# Patient Record
Sex: Female | Born: 1935
Health system: Southern US, Community
[De-identification: ages and names within clinical notes are randomized; demographics above are authoritative.]

## PROBLEM LIST (undated history)

## (undated) DIAGNOSIS — F419 Anxiety disorder, unspecified: Secondary | ICD-10-CM

## (undated) DIAGNOSIS — R5383 Other fatigue: Secondary | ICD-10-CM

## (undated) DIAGNOSIS — R5381 Other malaise: Secondary | ICD-10-CM

## (undated) DIAGNOSIS — I10 Essential (primary) hypertension: Secondary | ICD-10-CM

## (undated) DIAGNOSIS — N189 Chronic kidney disease, unspecified: Secondary | ICD-10-CM

## (undated) DIAGNOSIS — K5730 Diverticulosis of large intestine without perforation or abscess without bleeding: Secondary | ICD-10-CM

## (undated) DIAGNOSIS — I6529 Occlusion and stenosis of unspecified carotid artery: Secondary | ICD-10-CM

## (undated) DIAGNOSIS — J449 Chronic obstructive pulmonary disease, unspecified: Secondary | ICD-10-CM

## (undated) DIAGNOSIS — R0602 Shortness of breath: Secondary | ICD-10-CM

## (undated) DIAGNOSIS — Z87891 Personal history of nicotine dependence: Secondary | ICD-10-CM

## (undated) DIAGNOSIS — I700 Atherosclerosis of aorta: Secondary | ICD-10-CM

## (undated) DIAGNOSIS — E039 Hypothyroidism, unspecified: Secondary | ICD-10-CM

## (undated) DIAGNOSIS — M1990 Unspecified osteoarthritis, unspecified site: Secondary | ICD-10-CM

## (undated) DIAGNOSIS — E0500 Thyrotoxicosis with diffuse goiter without thyrotoxic crisis or storm: Secondary | ICD-10-CM

## (undated) DIAGNOSIS — E785 Hyperlipidemia, unspecified: Secondary | ICD-10-CM

## (undated) DIAGNOSIS — Z8673 Personal history of transient ischemic attack (TIA), and cerebral infarction without residual deficits: Principal | ICD-10-CM

## (undated) DIAGNOSIS — Z86010 Personal history of colonic polyps: Secondary | ICD-10-CM

## (undated) DIAGNOSIS — I639 Cerebral infarction, unspecified: Secondary | ICD-10-CM

## (undated) DIAGNOSIS — M5137 Other intervertebral disc degeneration, lumbosacral region: Secondary | ICD-10-CM

## (undated) HISTORY — DX: Personal history of nicotine dependence: Z87.891

## (undated) HISTORY — DX: Other fatigue: R53.83

## (undated) HISTORY — DX: Thyrotoxicosis with diffuse goiter without thyrotoxic crisis or storm: E05.00

## (undated) HISTORY — DX: Hyperlipidemia, unspecified: E78.5

## (undated) HISTORY — DX: Shortness of breath: R06.02

## (undated) HISTORY — DX: Hypothyroidism, unspecified: E03.9

## (undated) HISTORY — PX: CHOLECYSTECTOMY: SHX55

## (undated) HISTORY — DX: Diverticulosis of large intestine without perforation or abscess without bleeding: K57.30

## (undated) HISTORY — DX: Personal history of colonic polyps: Z86.010

## (undated) HISTORY — PX: CARDIAC CATHETERIZATION: SHX172

## (undated) HISTORY — DX: Essential (primary) hypertension: I10

## (undated) HISTORY — PX: KNEE SURGERY: SHX244

## (undated) HISTORY — DX: Other intervertebral disc degeneration, lumbosacral region: M51.37

## (undated) HISTORY — DX: Other malaise: R53.81

## (undated) HISTORY — PX: CATARACT EXTRACTION: SUR2

## (undated) HISTORY — DX: Unspecified osteoarthritis, unspecified site: M19.90

## (undated) HISTORY — DX: Personal history of transient ischemic attack (TIA), and cerebral infarction without residual deficits: Z86.73

## (undated) HISTORY — DX: Occlusion and stenosis of unspecified carotid artery: I65.29

---

## 1999-05-30 ENCOUNTER — Encounter: Admission: RE | Admit: 1999-05-30 | Discharge: 1999-05-30 | Payer: Self-pay | Admitting: Internal Medicine

## 1999-05-30 ENCOUNTER — Encounter: Payer: Self-pay | Admitting: Internal Medicine

## 1999-06-06 ENCOUNTER — Encounter: Payer: Self-pay | Admitting: Internal Medicine

## 1999-06-06 ENCOUNTER — Encounter: Admission: RE | Admit: 1999-06-06 | Discharge: 1999-06-06 | Payer: Self-pay | Admitting: Internal Medicine

## 2000-02-27 ENCOUNTER — Other Ambulatory Visit: Admission: RE | Admit: 2000-02-27 | Discharge: 2000-02-27 | Payer: Self-pay | Admitting: Internal Medicine

## 2000-08-04 ENCOUNTER — Encounter: Admission: RE | Admit: 2000-08-04 | Discharge: 2000-08-04 | Payer: Self-pay | Admitting: Internal Medicine

## 2000-08-04 ENCOUNTER — Encounter: Payer: Self-pay | Admitting: Internal Medicine

## 2001-08-06 ENCOUNTER — Encounter: Admission: RE | Admit: 2001-08-06 | Discharge: 2001-08-06 | Payer: Self-pay | Admitting: Internal Medicine

## 2001-08-06 ENCOUNTER — Encounter: Payer: Self-pay | Admitting: Internal Medicine

## 2002-08-09 ENCOUNTER — Encounter: Admission: RE | Admit: 2002-08-09 | Discharge: 2002-08-09 | Payer: Self-pay | Admitting: Internal Medicine

## 2002-08-09 ENCOUNTER — Encounter: Payer: Self-pay | Admitting: Internal Medicine

## 2003-03-31 ENCOUNTER — Encounter: Payer: Self-pay | Admitting: Internal Medicine

## 2003-08-17 ENCOUNTER — Encounter: Admission: RE | Admit: 2003-08-17 | Discharge: 2003-08-17 | Payer: Self-pay | Admitting: Internal Medicine

## 2004-04-24 ENCOUNTER — Ambulatory Visit: Payer: Self-pay | Admitting: Internal Medicine

## 2004-08-31 ENCOUNTER — Encounter: Admission: RE | Admit: 2004-08-31 | Discharge: 2004-08-31 | Payer: Self-pay | Admitting: Internal Medicine

## 2005-03-28 ENCOUNTER — Inpatient Hospital Stay (HOSPITAL_COMMUNITY): Admission: AD | Admit: 2005-03-28 | Discharge: 2005-03-30 | Payer: Self-pay | Admitting: Internal Medicine

## 2005-03-28 ENCOUNTER — Ambulatory Visit: Payer: Self-pay | Admitting: Internal Medicine

## 2005-03-29 ENCOUNTER — Encounter: Payer: Self-pay | Admitting: Internal Medicine

## 2005-03-29 ENCOUNTER — Encounter: Payer: Self-pay | Admitting: Cardiology

## 2005-03-29 ENCOUNTER — Ambulatory Visit: Payer: Self-pay | Admitting: Internal Medicine

## 2005-03-29 ENCOUNTER — Ambulatory Visit: Payer: Self-pay | Admitting: Cardiology

## 2005-04-02 ENCOUNTER — Ambulatory Visit: Payer: Self-pay | Admitting: Internal Medicine

## 2005-04-04 ENCOUNTER — Ambulatory Visit: Payer: Self-pay

## 2005-04-08 ENCOUNTER — Ambulatory Visit: Payer: Self-pay | Admitting: Internal Medicine

## 2005-04-08 ENCOUNTER — Encounter (HOSPITAL_COMMUNITY): Admission: RE | Admit: 2005-04-08 | Discharge: 2005-07-07 | Payer: Self-pay | Admitting: Internal Medicine

## 2005-04-10 ENCOUNTER — Ambulatory Visit: Payer: Self-pay | Admitting: Internal Medicine

## 2005-05-22 ENCOUNTER — Ambulatory Visit: Payer: Self-pay | Admitting: Internal Medicine

## 2005-08-21 ENCOUNTER — Ambulatory Visit: Payer: Self-pay | Admitting: Internal Medicine

## 2005-09-30 ENCOUNTER — Encounter: Admission: RE | Admit: 2005-09-30 | Discharge: 2005-09-30 | Payer: Self-pay | Admitting: Internal Medicine

## 2005-10-07 ENCOUNTER — Ambulatory Visit: Payer: Self-pay | Admitting: Internal Medicine

## 2005-11-20 ENCOUNTER — Ambulatory Visit: Payer: Self-pay | Admitting: Internal Medicine

## 2006-02-19 ENCOUNTER — Ambulatory Visit: Payer: Self-pay | Admitting: Internal Medicine

## 2006-05-14 ENCOUNTER — Ambulatory Visit: Payer: Self-pay | Admitting: Internal Medicine

## 2006-10-13 ENCOUNTER — Ambulatory Visit: Payer: Self-pay | Admitting: Internal Medicine

## 2006-10-21 ENCOUNTER — Encounter: Admission: RE | Admit: 2006-10-21 | Discharge: 2006-10-21 | Payer: Self-pay | Admitting: Internal Medicine

## 2006-10-21 ENCOUNTER — Encounter: Payer: Self-pay | Admitting: Internal Medicine

## 2006-11-13 ENCOUNTER — Ambulatory Visit: Payer: Self-pay | Admitting: Internal Medicine

## 2007-03-06 DIAGNOSIS — E785 Hyperlipidemia, unspecified: Secondary | ICD-10-CM

## 2007-03-06 DIAGNOSIS — M1990 Unspecified osteoarthritis, unspecified site: Secondary | ICD-10-CM

## 2007-03-06 DIAGNOSIS — I10 Essential (primary) hypertension: Secondary | ICD-10-CM

## 2007-03-06 DIAGNOSIS — E782 Mixed hyperlipidemia: Secondary | ICD-10-CM | POA: Insufficient documentation

## 2007-03-06 HISTORY — DX: Hyperlipidemia, unspecified: E78.5

## 2007-03-06 HISTORY — DX: Unspecified osteoarthritis, unspecified site: M19.90

## 2007-03-06 HISTORY — DX: Essential (primary) hypertension: I10

## 2007-04-14 ENCOUNTER — Ambulatory Visit: Payer: Self-pay | Admitting: Internal Medicine

## 2007-06-22 ENCOUNTER — Telehealth: Payer: Self-pay | Admitting: Internal Medicine

## 2007-10-13 ENCOUNTER — Ambulatory Visit: Payer: Self-pay | Admitting: Internal Medicine

## 2007-10-13 DIAGNOSIS — E039 Hypothyroidism, unspecified: Secondary | ICD-10-CM | POA: Insufficient documentation

## 2007-10-13 HISTORY — DX: Hypothyroidism, unspecified: E03.9

## 2007-10-13 LAB — CONVERTED CEMR LAB
ALT: 17 units/L (ref 0–35)
Albumin: 4 g/dL (ref 3.5–5.2)
BUN: 13 mg/dL (ref 6–23)
Basophils Relative: 0.3 % (ref 0.0–1.0)
Bilirubin, Direct: 0.2 mg/dL (ref 0.0–0.3)
CO2: 35 meq/L — ABNORMAL HIGH (ref 19–32)
Calcium: 9.5 mg/dL (ref 8.4–10.5)
Cholesterol: 228 mg/dL (ref 0–200)
Creatinine, Ser: 0.9 mg/dL (ref 0.4–1.2)
Direct LDL: 161.5 mg/dL
GFR calc Af Amer: 77 mL/min
Glucose, Bld: 109 mg/dL — ABNORMAL HIGH (ref 70–99)
HCT: 43.3 % (ref 36.0–46.0)
Hemoglobin: 14.6 g/dL (ref 12.0–15.0)
MCHC: 33.7 g/dL (ref 30.0–36.0)
Monocytes Absolute: 0.5 10*3/uL (ref 0.1–1.0)
Monocytes Relative: 9.2 % (ref 3.0–12.0)
Neutro Abs: 3.9 10*3/uL (ref 1.4–7.7)
RBC: 4.78 M/uL (ref 3.87–5.11)
RDW: 12.4 % (ref 11.5–14.6)
Sodium: 139 meq/L (ref 135–145)
TSH: 0.76 microintl units/mL (ref 0.35–5.50)
Total CHOL/HDL Ratio: 4.5
Total Protein: 7.2 g/dL (ref 6.0–8.3)

## 2007-11-04 ENCOUNTER — Encounter: Admission: RE | Admit: 2007-11-04 | Discharge: 2007-11-04 | Payer: Self-pay | Admitting: Internal Medicine

## 2007-11-09 ENCOUNTER — Ambulatory Visit: Payer: Self-pay | Admitting: Internal Medicine

## 2007-11-09 DIAGNOSIS — R531 Weakness: Secondary | ICD-10-CM

## 2007-11-09 DIAGNOSIS — R5381 Other malaise: Secondary | ICD-10-CM

## 2007-11-09 HISTORY — DX: Other malaise: R53.81

## 2007-11-17 ENCOUNTER — Ambulatory Visit: Payer: Self-pay | Admitting: Internal Medicine

## 2007-11-19 ENCOUNTER — Telehealth: Payer: Self-pay | Admitting: Internal Medicine

## 2007-12-02 ENCOUNTER — Ambulatory Visit: Payer: Self-pay | Admitting: Internal Medicine

## 2008-01-14 ENCOUNTER — Ambulatory Visit: Payer: Self-pay | Admitting: Internal Medicine

## 2008-01-14 LAB — CONVERTED CEMR LAB
Eosinophils Relative: 0.8 % (ref 0.0–5.0)
Lymphocytes Relative: 18.1 % (ref 12.0–46.0)
Monocytes Relative: 9.5 % (ref 3.0–12.0)
Neutrophils Relative %: 70.8 % (ref 43.0–77.0)
Platelets: 239 10*3/uL (ref 150–400)
RDW: 12.6 % (ref 11.5–14.6)
WBC: 5.9 10*3/uL (ref 4.5–10.5)

## 2008-04-12 ENCOUNTER — Ambulatory Visit: Payer: Self-pay | Admitting: Internal Medicine

## 2008-07-13 ENCOUNTER — Ambulatory Visit: Payer: Self-pay | Admitting: Internal Medicine

## 2008-07-13 DIAGNOSIS — R0602 Shortness of breath: Secondary | ICD-10-CM | POA: Insufficient documentation

## 2008-07-13 HISTORY — DX: Shortness of breath: R06.02

## 2008-07-14 ENCOUNTER — Telehealth: Payer: Self-pay | Admitting: Internal Medicine

## 2008-07-14 ENCOUNTER — Encounter: Payer: Self-pay | Admitting: Internal Medicine

## 2008-07-14 LAB — CONVERTED CEMR LAB: Troponin I: 0.01 ng/mL (ref <0.06)

## 2008-09-19 ENCOUNTER — Telehealth: Payer: Self-pay | Admitting: Internal Medicine

## 2008-09-30 DIAGNOSIS — K5730 Diverticulosis of large intestine without perforation or abscess without bleeding: Secondary | ICD-10-CM | POA: Insufficient documentation

## 2008-09-30 DIAGNOSIS — Z860100 Personal history of colon polyps, unspecified: Secondary | ICD-10-CM | POA: Insufficient documentation

## 2008-09-30 HISTORY — DX: Personal history of colon polyps, unspecified: Z86.0100

## 2008-09-30 HISTORY — DX: Diverticulosis of large intestine without perforation or abscess without bleeding: K57.30

## 2008-09-30 HISTORY — DX: Personal history of colonic polyps: Z86.010

## 2008-10-04 ENCOUNTER — Ambulatory Visit: Payer: Self-pay | Admitting: Internal Medicine

## 2008-10-12 ENCOUNTER — Ambulatory Visit: Payer: Self-pay | Admitting: Internal Medicine

## 2008-10-12 LAB — CONVERTED CEMR LAB
Alkaline Phosphatase: 97 units/L (ref 39–117)
BUN: 14 mg/dL (ref 6–23)
Basophils Relative: 0.2 % (ref 0.0–3.0)
Bilirubin, Direct: 0.1 mg/dL (ref 0.0–0.3)
Calcium: 9.2 mg/dL (ref 8.4–10.5)
Chloride: 99 meq/L (ref 96–112)
Cholesterol: 209 mg/dL — ABNORMAL HIGH (ref 0–200)
Creatinine, Ser: 0.8 mg/dL (ref 0.4–1.2)
Direct LDL: 133.8 mg/dL
Eosinophils Absolute: 0.1 10*3/uL (ref 0.0–0.7)
Eosinophils Relative: 1.8 % (ref 0.0–5.0)
Lymphocytes Relative: 24.8 % (ref 12.0–46.0)
MCHC: 34.4 g/dL (ref 30.0–36.0)
MCV: 92.7 fL (ref 78.0–100.0)
Monocytes Absolute: 0.8 10*3/uL (ref 0.1–1.0)
Neutrophils Relative %: 60.5 % (ref 43.0–77.0)
Platelets: 210 10*3/uL (ref 150.0–400.0)
RBC: 4.62 M/uL (ref 3.87–5.11)
Total Bilirubin: 1 mg/dL (ref 0.3–1.2)
Total CHOL/HDL Ratio: 4
Triglycerides: 91 mg/dL (ref 0.0–149.0)
VLDL: 18.2 mg/dL (ref 0.0–40.0)
WBC: 6 10*3/uL (ref 4.5–10.5)

## 2008-11-08 ENCOUNTER — Ambulatory Visit: Payer: Self-pay | Admitting: Internal Medicine

## 2008-11-08 ENCOUNTER — Encounter: Payer: Self-pay | Admitting: Internal Medicine

## 2008-11-09 ENCOUNTER — Encounter: Payer: Self-pay | Admitting: Internal Medicine

## 2008-11-25 ENCOUNTER — Encounter: Admission: RE | Admit: 2008-11-25 | Discharge: 2008-11-25 | Payer: Self-pay | Admitting: Internal Medicine

## 2008-11-29 ENCOUNTER — Encounter: Admission: RE | Admit: 2008-11-29 | Discharge: 2008-11-29 | Payer: Self-pay | Admitting: Internal Medicine

## 2008-12-09 DIAGNOSIS — S91009A Unspecified open wound, unspecified ankle, initial encounter: Secondary | ICD-10-CM

## 2008-12-09 DIAGNOSIS — S81009A Unspecified open wound, unspecified knee, initial encounter: Secondary | ICD-10-CM

## 2008-12-09 DIAGNOSIS — S81809A Unspecified open wound, unspecified lower leg, initial encounter: Secondary | ICD-10-CM | POA: Insufficient documentation

## 2008-12-12 ENCOUNTER — Ambulatory Visit: Payer: Self-pay | Admitting: Family Medicine

## 2008-12-16 ENCOUNTER — Ambulatory Visit: Payer: Self-pay | Admitting: Family Medicine

## 2008-12-16 DIAGNOSIS — M5137 Other intervertebral disc degeneration, lumbosacral region: Secondary | ICD-10-CM

## 2008-12-16 DIAGNOSIS — M51379 Other intervertebral disc degeneration, lumbosacral region without mention of lumbar back pain or lower extremity pain: Secondary | ICD-10-CM

## 2008-12-16 HISTORY — DX: Other intervertebral disc degeneration, lumbosacral region: M51.37

## 2008-12-16 HISTORY — DX: Other intervertebral disc degeneration, lumbosacral region without mention of lumbar back pain or lower extremity pain: M51.379

## 2008-12-19 ENCOUNTER — Ambulatory Visit: Payer: Self-pay | Admitting: Family Medicine

## 2009-04-12 ENCOUNTER — Ambulatory Visit: Payer: Self-pay | Admitting: Internal Medicine

## 2009-04-12 DIAGNOSIS — Z87891 Personal history of nicotine dependence: Secondary | ICD-10-CM

## 2009-04-12 HISTORY — DX: Personal history of nicotine dependence: Z87.891

## 2009-10-04 ENCOUNTER — Ambulatory Visit: Payer: Self-pay | Admitting: Internal Medicine

## 2009-10-04 DIAGNOSIS — M79609 Pain in unspecified limb: Secondary | ICD-10-CM | POA: Insufficient documentation

## 2009-10-18 ENCOUNTER — Ambulatory Visit: Payer: Self-pay | Admitting: Internal Medicine

## 2009-10-18 LAB — CONVERTED CEMR LAB: Cholesterol: 224 mg/dL — ABNORMAL HIGH (ref 0–200)

## 2009-11-30 ENCOUNTER — Encounter: Admission: RE | Admit: 2009-11-30 | Discharge: 2009-11-30 | Payer: Self-pay | Admitting: Internal Medicine

## 2009-12-06 ENCOUNTER — Ambulatory Visit: Payer: Self-pay | Admitting: Internal Medicine

## 2009-12-06 LAB — CONVERTED CEMR LAB
ALT: 17 units/L (ref 0–35)
Albumin: 4 g/dL (ref 3.5–5.2)
Basophils Relative: 0.4 % (ref 0.0–3.0)
Bilirubin, Direct: 0.1 mg/dL (ref 0.0–0.3)
CO2: 36 meq/L — ABNORMAL HIGH (ref 19–32)
Chloride: 98 meq/L (ref 96–112)
Eosinophils Relative: 1.7 % (ref 0.0–5.0)
HCT: 43.7 % (ref 36.0–46.0)
Hemoglobin: 14.8 g/dL (ref 12.0–15.0)
Lymphs Abs: 1.3 10*3/uL (ref 0.7–4.0)
MCHC: 33.9 g/dL (ref 30.0–36.0)
MCV: 94.2 fL (ref 78.0–100.0)
Monocytes Absolute: 0.6 10*3/uL (ref 0.1–1.0)
Neutro Abs: 3.6 10*3/uL (ref 1.4–7.7)
Neutrophils Relative %: 64.7 % (ref 43.0–77.0)
Potassium: 4.5 meq/L (ref 3.5–5.1)
RBC: 4.64 M/uL (ref 3.87–5.11)
Sed Rate: 12 mm/hr (ref 0–22)
Sodium: 142 meq/L (ref 135–145)
Total Protein: 7.1 g/dL (ref 6.0–8.3)
WBC: 5.6 10*3/uL (ref 4.5–10.5)

## 2010-04-25 ENCOUNTER — Encounter: Payer: Self-pay | Admitting: Internal Medicine

## 2010-04-25 ENCOUNTER — Ambulatory Visit: Payer: Self-pay | Admitting: Internal Medicine

## 2010-08-05 LAB — CONVERTED CEMR LAB
Bilirubin, Direct: 0.1 mg/dL (ref 0.0–0.3)
Eosinophils Absolute: 0.2 10*3/uL (ref 0.0–0.6)
Eosinophils Relative: 2.6 % (ref 0.0–5.0)
GFR calc Af Amer: 89 mL/min
GFR calc non Af Amer: 74 mL/min
Glucose, Bld: 116 mg/dL — ABNORMAL HIGH (ref 70–99)
HCT: 41 % (ref 36.0–46.0)
HDL: 50.5 mg/dL (ref >39.0)
Hemoglobin: 14 g/dL (ref 12.0–15.0)
Hgb A1c MFr Bld: 6 % (ref 4.6–6.0)
Lymphocytes Relative: 22 % (ref 12.0–46.0)
MCV: 91.4 fL (ref 78.0–100.0)
Monocytes Absolute: 0.7 10*3/uL (ref 0.2–0.7)
Neutro Abs: 4.2 10*3/uL (ref 1.4–7.7)
Neutrophils Relative %: 63.6 % (ref 43.0–77.0)
Sodium: 141 meq/L (ref 135–145)
TSH: 0.13 microintl units/mL — ABNORMAL LOW (ref 0.35–5.50)
Total Protein: 7.1 g/dL (ref 6.0–8.3)
WBC: 6.5 10*3/uL (ref 4.5–10.5)

## 2010-08-07 NOTE — Assessment & Plan Note (Signed)
Summary: BP LOW NO ENERGY/PS   Vital Signs:  [name] profile:   77 year old female Weight:      162 pounds Temp:     98.1 degrees F oral BP sitting:   138 / 80  (right arm) Cuff size:   regular  Vitals Entered By: Duard Brady LPN (December 07, 4538 8:34 AM) CC: c/o no energy , nausea -  Is [name] Diabetic? No   Primary Care Provider:  Eleonore Chiquito MD  CC:  c/o no energy  and nausea - .  History of Present Illness: [name] who for the past week has felt weak and and drained.  She describes a lack of energy.  She also describes some occasional nausea.  One week ago.  She felt the a bit short of breath, but this resolved after a several minutes.  She denies any dyspnea on exertion or chest pain.  Today she feels somewhat weak, but has no real focal symptoms.  She does have treated hypertension, and dyslipidemia.  Allergies (verified): No Known Drug Allergies  Past History:  Past Medical History: Reviewed history from 09/30/2008 and no changes required. Graves disease, S/P R131 history or presyncope.  September 2006 COLONIC POLYPS, HYPERPLASTIC, HX OF (ICD-V12.72) COLONIC POLYPS, ADENOMATOUS, HX OF (ICD-V12.72) DIVERTICULOSIS, COLON (ICD-562.10) DYSPNEA (ICD-786.05) WEAKNESS (ICD-780.79) HYPOTHYROIDISM (ICD-244.9) OSTEOARTHRITIS (ICD-715.90) HYPERTENSION (ICD-401.9) HYPERLIPIDEMIA (ICD-272.4)  Past Surgical History: Reviewed history from 09/30/2008 and no changes required. Cataract extraction-right eye Knee surgery -right Cholecystectomy heart catheterization, 1992 Angioplasty gravida 4, para 4, abortus zero status post I-131 2006 colonoscopy September 2004 adenosine Myoview study September 2006 ejection fraction 66% 2-D echocardiogram September 2006 with ejection fraction 55 to 65%  Family History: Reviewed history from 09/30/2008 and no changes required. father  died at 80 of myocardial infarction mother died 49, breast cancer One  brother Family History of Stomach Cancer: MGM No FH of Colon Cancer:  Review of Systems       The [name] complains of muscle weakness.  The [name] denies anorexia, fever, weight loss, weight gain, vision loss, decreased hearing, hoarseness, chest pain, syncope, dyspnea on exertion, peripheral edema, prolonged cough, headaches, hemoptysis, abdominal pain, melena, hematochezia, severe indigestion/heartburn, hematuria, incontinence, genital sores, suspicious skin lesions, transient blindness, difficulty walking, depression, unusual weight change, abnormal bleeding, enlarged lymph nodes, angioedema, and breast masses.    Physical Exam  General:  Well-developed,well-nourished,in no acute distress; alert,appropriate and cooperative throughout examination; 130/80 Head:  Normocephalic and atraumatic without obvious abnormalities. No apparent alopecia or balding. Eyes:  No corneal or conjunctival inflammation noted. EOMI. Perrla. Funduscopic exam benign, without hemorrhages, exudates or papilledema. Vision grossly normal. Mouth:  Oral mucosa and oropharynx without lesions or exudates.  Teeth in good repair. Neck:  No deformities, masses, or tenderness noted. Lungs:  a few crackles, right base; diminished breath sounds left base O2 saturation 97% Heart:  Normal rate and regular rhythm. S1 and S2 normal without gallop, murmur, click, rub or other extra sounds. Abdomen:  Bowel sounds positive,abdomen soft and non-tender without masses, organomegaly or hernias noted. Msk:  No deformity or scoliosis noted of thoracic or lumbar spine.   Pulses:  posterior tibial pulses not easily palpable.  dorsalis pedis  pulses full Extremities:  No clubbing, cyanosis, edema, or deformity noted with normal full range of motion of all joints.   Neurologic:  alert & oriented X3, cranial nerves II-XII intact, sensation intact to light touch, and sensation intact to pinprick.  alert & oriented X3, cranial nerves  II-XII  intact, sensation intact to light touch, and sensation intact to pinprick.     Impression & Recommendations:  Problem # 1:  WEAKNESS (ICD-780.79)  Orders: Venipuncture (16109) TLB-BMP (Basic Metabolic Panel-BMET) (80048-METABOL) TLB-CBC Platelet - w/Differential (85025-CBCD) TLB-Hepatic/Liver Function Pnl (80076-HEPATIC) TLB-TSH (Thyroid Stimulating Hormone) (84443-TSH) TLB-Sedimentation Rate (ESR) (85652-ESR) T-2 View CXR (71020TC)  Problem # 2:  DYSPNEA (ICD-786.05)  Orders: Venipuncture (60454) TLB-BMP (Basic Metabolic Panel-BMET) (80048-METABOL) TLB-CBC Platelet - w/Differential (85025-CBCD) TLB-Hepatic/Liver Function Pnl (80076-HEPATIC) TLB-TSH (Thyroid Stimulating Hormone) (84443-TSH) T-2 View CXR (71020TC)  Problem # 3:  HYPOTHYROIDISM (ICD-244.9)  Her updated medication list for this problem includes:    Levothyroxine Sodium 75 Mcg Tabs (Levothyroxine sodium) .[name] Kitchen... 1 once daily    Her updated medication list for this problem includes:    Levothyroxine Sodium 75 Mcg Tabs (Levothyroxine sodium) .[name] Kitchen... 1 once daily  Orders: Venipuncture (09811) TLB-BMP (Basic Metabolic Panel-BMET) (80048-METABOL) TLB-CBC Platelet - w/Differential (85025-CBCD) TLB-Hepatic/Liver Function Pnl (80076-HEPATIC) TLB-TSH (Thyroid Stimulating Hormone) (84443-TSH)  Problem # 4:  HYPERLIPIDEMIA (ICD-272.4)  The following medications were removed from the medication list:    Pravastatin Sodium 20 Mg Tabs (Pravastatin sodium) ..... One daily Her updated medication list for this problem includes:    Pravastatin Sodium 40 Mg Tabs (Pravastatin sodium) ..... One daily  The following medications were removed from the medication list:    Pravastatin Sodium 20 Mg Tabs (Pravastatin sodium) ..... One daily Her updated medication list for this problem includes:    Pravastatin Sodium 40 Mg Tabs (Pravastatin sodium) ..... One daily  Problem # 5:  HYPERTENSION (ICD-401.9)  Her updated medication  list for this problem includes:    Hydrochlorothiazide 25 Mg Tabs (Hydrochlorothiazide) .[name] Kitchen... 1 once daily    Felodipine 5 Mg Tb24 (Felodipine) .[name] Kitchen... 1 once daily    Her updated medication list for this problem includes:    Hydrochlorothiazide 25 Mg Tabs (Hydrochlorothiazide) .[name] Kitchen... 1 once daily    Felodipine 5 Mg Tb24 (Felodipine) .[name] Kitchen... 1 once daily  Orders: Venipuncture (91478) TLB-BMP (Basic Metabolic Panel-BMET) (80048-METABOL) TLB-CBC Platelet - w/Differential (85025-CBCD) TLB-Hepatic/Liver Function Pnl (80076-HEPATIC) TLB-TSH (Thyroid Stimulating Hormone) (84443-TSH)  Complete Medication List: 1)  Hydrochlorothiazide 25 Mg Tabs (Hydrochlorothiazide) .[name] Kitchen.. 1 once daily 2)  Levothyroxine Sodium 75 Mcg Tabs (Levothyroxine sodium) .[name] Kitchen.. 1 once daily 3)  Felodipine 5 Mg Tb24 (Felodipine) .[name] Kitchen.. 1 once daily 4)  Adult Aspirin Ec Low Strength 81 Mg Tbec (Aspirin) .[name] Kitchen.. 1 once daily 5)  Pravastatin Sodium 40 Mg Tabs (Pravastatin sodium) .... One daily  [name] Instructions: 1)  Please schedule a follow-up appointment as needed. 2)  Limit your Sodium (Salt) to less than 2 grams a day(slightly less than 1/2 a teaspoon) to prevent fluid retention, swelling, or worsening of symptoms. 3)  It is important that you exercise regularly at least 20 minutes 5 times a week. If you develop chest pain, have severe difficulty breathing, or feel very tired , stop exercising immediately and seek medical attention. Prescriptions: PRAVASTATIN SODIUM 40 MG TABS (PRAVASTATIN SODIUM) one daily  #90 x 6   Entered and Authorized by:   Gordy Savers  MD   Signed by:   Gordy Savers  MD on 12/06/2009   Method used:   Electronically to        [facility]  [phone]* (retail)       [address] [place], [place]  [address]       Ph: [phone] or [phone]  Fax: [phone]   RxID:   [id number]

## 2010-08-07 NOTE — Assessment & Plan Note (Signed)
Summary: 6 month rov/njr/pt rescd from bump//ccm   Vital Signs:  Patient profile:   77 year old female Weight:      166 pounds Temp:     97.8 degrees F oral BP sitting:   120 / 72  (left arm) Cuff size:   regular  Vitals Entered By: Duard Brady LPN (October 18, 2009 10:53 AM) CC: 6 mos rov - doing well Is Patient Diabetic? No   Primary Care Provider:  Eleonore Chiquito MD  CC:  6 mos rov - doing well.  History of Present Illness: an 77 year old patient who is seen today for follow-up of her hypertension, dyslipidemia, and hypothyroidism.  She has done remarkably well.  No concerns or complaints.  Due to cost concerns.  She was switched to pravastatin, recently, she continues to tolerate this medication well.  Blood pressure remains under excellent control.  She is on supplemental levothyroxine  Allergies (verified): No Known Drug Allergies  Past History:  Past Medical History: Reviewed history from 09/30/2008 and no changes required. Graves disease, S/P R131 history or presyncope.  September 2006 COLONIC POLYPS, HYPERPLASTIC, HX OF (ICD-V12.72) COLONIC POLYPS, ADENOMATOUS, HX OF (ICD-V12.72) DIVERTICULOSIS, COLON (ICD-562.10) DYSPNEA (ICD-786.05) WEAKNESS (ICD-780.79) HYPOTHYROIDISM (ICD-244.9) OSTEOARTHRITIS (ICD-715.90) HYPERTENSION (ICD-401.9) HYPERLIPIDEMIA (ICD-272.4)  Review of Systems  The patient denies anorexia, fever, weight loss, weight gain, vision loss, decreased hearing, hoarseness, chest pain, syncope, dyspnea on exertion, peripheral edema, prolonged cough, headaches, hemoptysis, abdominal pain, melena, hematochezia, severe indigestion/heartburn, hematuria, incontinence, genital sores, muscle weakness, suspicious skin lesions, transient blindness, difficulty walking, depression, unusual weight change, abnormal bleeding, enlarged lymph nodes, angioedema, and breast masses.    Physical Exam  General:  Well-developed,well-nourished,in no acute  distress; alert,appropriate and cooperative throughout examination; 120/70 Head:  Normocephalic and atraumatic without obvious abnormalities. No apparent alopecia or balding. Mouth:  Oral mucosa and oropharynx without lesions or exudates.  Teeth in good repair. Neck:  No deformities, masses, or tenderness noted. Lungs:  Normal respiratory effort, chest expands symmetrically. Lungs are clear to auscultation, no crackles or wheezes. Heart:  Normal rate and regular rhythm. S1 and S2 normal without gallop, murmur, click, rub or other extra sounds. Abdomen:  Bowel sounds positive,abdomen soft and non-tender without masses, organomegaly or hernias noted.   Impression & Recommendations:  Problem # 1:  HYPERTENSION (ICD-401.9)  Her updated medication list for this problem includes:    Hydrochlorothiazide 25 Mg Tabs (Hydrochlorothiazide) .[name] Kitchen... 1 once daily    Felodipine 5 Mg Tb24 (Felodipine) .[name] Kitchen... 1 once daily  Her updated medication list for this problem includes:    Hydrochlorothiazide 25 Mg Tabs (Hydrochlorothiazide) .[name] Kitchen... 1 once daily    Felodipine 5 Mg Tb24 (Felodipine) .[name] Kitchen... 1 once daily  Problem # 2:  HYPERLIPIDEMIA (ICD-272.4)  Her updated medication list for this problem includes:    Pravastatin Sodium 20 Mg Tabs (Pravastatin sodium) ..... One daily  Her updated medication list for this problem includes:    Pravastatin Sodium 20 Mg Tabs (Pravastatin sodium) ..... One daily  Orders: Venipuncture (16109) TLB-Cholesterol, Total (82465-CHO)  Complete Medication List: 1)  Hydrochlorothiazide 25 Mg Tabs (Hydrochlorothiazide) .[name] Kitchen.. 1 once daily 2)  Levothyroxine Sodium 75 Mcg Tabs (Levothyroxine sodium) .[name] Kitchen.. 1 once daily 3)  Felodipine 5 Mg Tb24 (Felodipine) .[name] Kitchen.. 1 once daily 4)  Adult Aspirin Ec Low Strength 81 Mg Tbec (Aspirin) .[name] Kitchen.. 1 once daily 5)  Pravastatin Sodium 20 Mg Tabs (Pravastatin sodium) .... One daily 6)  Flexeril 5 Mg Tabs (Cyclobenzaprine hcl) .... Take 1 tablet by  mouth three times a day  Patient Instructions: 1)  Please schedule a follow-up appointment in 6 months. 2)  Advised not to eat any food or drink any liquids after 10 PM the night before your procedure. 3)  It is important that you exercise regularly at least 20 minutes 5 times a week. If you develop chest pain, have severe difficulty breathing, or feel very tired , stop exercising immediately and seek medical attention. 4)  You need to lose weight. Consider a lower calorie diet and regular exercise.  Prescriptions: PRAVASTATIN SODIUM 20 MG TABS (PRAVASTATIN SODIUM) one daily  #90 x 6   Entered and Authorized by:   Gordy Savers  MD   Signed by:   Gordy Savers  MD on 10/18/2009   Method used:   Electronically to        [facility]  [phone]* (retail)       [address] [place], [place]  [address]       Ph: [phone] or [phone]       Fax: [phone]   RxID:   [id number] FELODIPINE 5 MG  TB24 (FELODIPINE) 1 once daily  #90 x 6   Entered and Authorized by:   Gordy Savers  MD   Signed by:   Gordy Savers  MD on 10/18/2009   Method used:   Electronically to        [facility]  [phone]* (retail)       [address] [place], [place]  [address]       Ph: [phone] or [phone]       Fax: [phone]   RxID:   [id number] LEVOTHYROXINE SODIUM 75 MCG  TABS (LEVOTHYROXINE SODIUM) 1 once daily  #90 x 6   Entered and Authorized by:   Gordy Savers  MD   Signed by:   Gordy Savers  MD on 10/18/2009   Method used:   Electronically to        [facility]  [phone]* (retail)       [address] [place], [place]  [address]       Ph: [phone] or [phone]       Fax: [phone]   RxID:   [id number] HYDROCHLOROTHIAZIDE 25 MG  TABS (HYDROCHLOROTHIAZIDE) 1 once daily  #90 x 6   Entered and Authorized by:   Gordy Savers  MD   Signed by:   Gordy Savers  MD on 10/18/2009   Method  used:   Electronically to        [facility]  [phone]* (retail)       [address] [place], [place]  [address]       Ph: [phone] or [phone]       Fax: [phone]   RxID:   [id number]

## 2010-08-07 NOTE — Assessment & Plan Note (Signed)
Summary: pt will come in fasting/njr   Vital Signs:  Patient profile:   77 year old female Height:      66 inches Weight:      164 pounds BMI:     26.57 Temp:     98.0 degrees F oral BP sitting:   118 / 70  (left arm) Cuff size:   regular  Vitals Entered By: Duard Brady LPN (April 25, 2010 9:36 AM) CC: cpx - doing well Is Patient Diabetic? No Flu Vaccine Consent Questions     Do you have a history of severe allergic reactions to this vaccine? no    Any prior history of allergic reactions to egg and/or gelatin? no    Do you have a sensitivity to the preservative Thimersol? no    Do you have a past history of Guillan-Barre Syndrome? no    Do you currently have an acute febrile illness? no    Have you ever had a severe reaction to latex? no    Vaccine information given and explained to patient? yes    Are you currently pregnant? no    Lot Number:[id number]   Exp Date:01/05/2011   Site Given  Left Deltoid IM   Primary Care Provider:  Eleonore Chiquito MD  CC:  cpx - doing well.  History of Present Illness: an 77 year old patient who is seen today for a wellness exam.  She has treated hypertension, osteoarthritis, dyslipidemia, and hypothyroidism.  She does remarkably well.  Here for Medicare AWV:  1.   Risk factors based on Past M, S, F history:cardiovascular risk factors include hypertension, and dyslipidemia.  She also has a family history of coronary artery disease 2.   Physical Activities: remains active without exercise limitations 3.   Depression/mood: no history of depression or mood disorder 4.   Hearing: no deficits 5.   ADL's: independent in all aspects of daily living 6.   Fall Risk: low 7.   Home Safety: no problems identified 8.   Height, weight, &visual acuity:height and weight stable.  No difficulty with visual acuity 9.   Counseling: heart healthy diet more regular exercise restricted salt diet.  All discussed and encouraged calcium and vitamin D  supplementation.  Encouraged 10.   Labs ordered based on risk factors: laboratory studies were done 3 months ago 11.           Referral Coordination- none appropriate at this time 12.           Care Plan- heart healthy diet and compliance with medications.  All encouraged 13.            Cognitive Assessment- alert and oriented, with normal affect; no memory concerns handles all executive functioning without difficulty   Preventive Screening-Counseling & Management  Alcohol-Tobacco     Smoking Status: quit  Allergies (verified): No Known Drug Allergies  Past History:  Past Medical History: Reviewed history from 09/30/2008 and no changes required. Graves disease, S/P R131 history or presyncope.  September 2006 COLONIC POLYPS, HYPERPLASTIC, HX OF (ICD-V12.72) COLONIC POLYPS, ADENOMATOUS, HX OF (ICD-V12.72) DIVERTICULOSIS, COLON (ICD-562.10) DYSPNEA (ICD-786.05) WEAKNESS (ICD-780.79) HYPOTHYROIDISM (ICD-244.9) OSTEOARTHRITIS (ICD-715.90) HYPERTENSION (ICD-401.9) HYPERLIPIDEMIA (ICD-272.4)  Past Surgical History: Reviewed history from 09/30/2008 and no changes required. Cataract extraction-right eye Knee surgery -right Cholecystectomy heart catheterization, 1992 Angioplasty gravida 4, para 4, abortus zero status post I-131 2006 colonoscopy September 2004 adenosine Myoview study September 2006 ejection fraction 66% 2-D echocardiogram September 2006 with ejection fraction 55 to 65%  Family History:  Reviewed history from 09/30/2008 and no changes required. father  died at 57 of myocardial infarction mother died 16, breast cancer One brother-  Family History of Stomach Cancer: MGM No FH of Colon Cancer:  Social History: Reviewed history from 09/30/2008 and no changes required. Patient is a former smoker. -Stopped 25 years ago Alcohol Use - yes-2 glasses daily Daily Caffeine Use- 2 cups daily Illicit Drug Use - no Smoking Status:  quit  Review of Systems  The  patient denies anorexia, fever, weight loss, weight gain, vision loss, decreased hearing, hoarseness, chest pain, syncope, dyspnea on exertion, peripheral edema, prolonged cough, headaches, hemoptysis, abdominal pain, melena, hematochezia, severe indigestion/heartburn, hematuria, incontinence, genital sores, muscle weakness, suspicious skin lesions, transient blindness, difficulty walking, depression, unusual weight change, abnormal bleeding, enlarged lymph nodes, angioedema, and breast masses.    Physical Exam  General:  Well-developed,well-nourished,in no acute distress; alert,appropriate and cooperative throughout examination Head:  Normocephalic and atraumatic without obvious abnormalities. No apparent alopecia or balding. Eyes:  No corneal or conjunctival inflammation noted. EOMI. Perrla. Funduscopic exam benign, without hemorrhages, exudates or papilledema. Vision grossly normal. Ears:  External ear exam shows no significant lesions or deformities.  Otoscopic examination reveals clear canals, tympanic membranes are intact bilaterally without bulging, retraction, inflammation or discharge. Hearing is grossly normal bilaterally. Nose:  External nasal examination shows no deformity or inflammation. Nasal mucosa are pink and moist without lesions or exudates. Mouth:  Oral mucosa and oropharynx without lesions or exudates.  Teeth in good repair. Neck:  No deformities, masses, or tenderness noted. Chest Wall:  No deformities, masses, or tenderness noted. Breasts:  No mass, nodules, thickening, tenderness, bulging, retraction, inflamation, nipple discharge or skin changes noted.   Lungs:  Normal respiratory effort, chest expands symmetrically. Lungs are clear to auscultation, no crackles or wheezes. Heart:  Normal rate and regular rhythm. S1 and S2 normal without gallop, murmur, click, rub or other extra sounds. Abdomen:  Bowel sounds positive,abdomen soft and non-tender without masses, organomegaly or  hernias noted. Rectal:  declines Genitalia:  declines Msk:  No deformity or scoliosis noted of thoracic or lumbar spine.   Pulses:  R and L carotid,radial,femoral,dorsalis pedis and posterior tibial pulses are full and equal bilaterally Extremities:  No clubbing, cyanosis, edema, or deformity noted with normal full range of motion of all joints.   Neurologic:  No cranial nerve deficits noted. Station and gait are normal. Plantar reflexes are down-going bilaterally. DTRs are symmetrical throughout. Sensory, motor and coordinative functions appear intact. Skin:  Intact without suspicious lesions or rashes Cervical Nodes:  No lymphadenopathy noted Axillary Nodes:  No palpable lymphadenopathy Inguinal Nodes:  No significant adenopathy Psych:  Cognition and judgment appear intact. Alert and cooperative with normal attention span and concentration. No apparent delusions, illusions, hallucinations   Impression & Recommendations:  Problem # 1:  PREVENTIVE HEALTH CARE (ICD-V70.0)  Orders: Medicare -1st Annual Wellness Visit [phone])  Problem # 2:  HYPERTENSION (ICD-401.9)  Her updated medication list for this problem includes:    Hydrochlorothiazide 25 Mg Tabs (Hydrochlorothiazide) .[name] Kitchen... 1 once daily    Felodipine 5 Mg Tb24 (Felodipine) .[name] Kitchen... 1 once daily  Orders: EKG w/ Interpretation (93000)  Her updated medication list for this problem includes:    Hydrochlorothiazide 25 Mg Tabs (Hydrochlorothiazide) .[name] Kitchen... 1 once daily    Felodipine 5 Mg Tb24 (Felodipine) .[name] Kitchen... 1 once daily  Problem # 3:  HYPERLIPIDEMIA (ICD-272.4)  Her updated medication list for this problem includes:    Pravastatin Sodium 40  Mg Tabs (Pravastatin sodium) ..... One daily  Her updated medication list for this problem includes:    Pravastatin Sodium 40 Mg Tabs (Pravastatin sodium) ..... One daily  Problem # 4:  HYPOTHYROIDISM (ICD-244.9)  Her updated medication list for this problem includes:    Levothyroxine  Sodium 75 Mcg Tabs (Levothyroxine sodium) .[name] Kitchen... 1 once daily  Her updated medication list for this problem includes:    Levothyroxine Sodium 75 Mcg Tabs (Levothyroxine sodium) .[name] Kitchen... 1 once daily  Complete Medication List: 1)  Hydrochlorothiazide 25 Mg Tabs (Hydrochlorothiazide) .[name] Kitchen.. 1 once daily 2)  Levothyroxine Sodium 75 Mcg Tabs (Levothyroxine sodium) .[name] Kitchen.. 1 once daily 3)  Felodipine 5 Mg Tb24 (Felodipine) .[name] Kitchen.. 1 once daily 4)  Adult Aspirin Ec Low Strength 81 Mg Tbec (Aspirin) .[name] Kitchen.. 1 once daily 5)  Pravastatin Sodium 40 Mg Tabs (Pravastatin sodium) .... One daily  Other Orders: Flu Vaccine 7yrs + MEDICARE PATIENTS (D1761) Administration Flu vaccine - MCR (Y0737)  Patient Instructions: 1)  Please schedule a follow-up appointment in 6 months. 2)  Limit your Sodium (Salt). 3)  It is important that you exercise regularly at least 20 minutes 5 times a week. If you develop chest pain, have severe difficulty breathing, or feel very tired , stop exercising immediately and seek medical attention. 4)  You need to lose weight. Consider a lower calorie diet and regular exercise.  5)  Take calcium +Vitamin D daily. Prescriptions: PRAVASTATIN SODIUM 40 MG TABS (PRAVASTATIN SODIUM) one daily  #90 x 6   Entered and Authorized by:   Gordy Savers  MD   Signed by:   Gordy Savers  MD on 04/25/2010   Method used:   Electronically to        [facility]  [phone]* (retail)       [address] [place], [place]  [address]       Ph: [phone] or [phone]       Fax: [phone]   RxID:   [id number] FELODIPINE 5 MG  TB24 (FELODIPINE) 1 once daily  #90 Tablet x 3   Entered and Authorized by:   Gordy Savers  MD   Signed by:   Gordy Savers  MD on 04/25/2010   Method used:   Electronically to        [facility]  [phone]* (retail)       [address] [place], [place]  [address]       Ph: [phone] or [phone]       Fax: [phone]    RxID:   [id number] LEVOTHYROXINE SODIUM 75 MCG  TABS (LEVOTHYROXINE SODIUM) 1 once daily  #90 x 6   Entered and Authorized by:   Gordy Savers  MD   Signed by:   Gordy Savers  MD on 04/25/2010   Method used:   Electronically to        [facility]  [phone]* (retail)       [address] [place], [place]  [address]       Ph: [phone] or [phone]       Fax: [phone]   RxID:   [id number] HYDROCHLOROTHIAZIDE 25 MG  TABS (HYDROCHLOROTHIAZIDE) 1 once daily  #90 Tablet x 2   Entered and Authorized by:   Gordy Savers  MD   Signed by:   Gordy Savers  MD  on 04/25/2010   Method used:   Electronically to        [facility]  [phone]* (retail)       [address] [place], [place]  [address]       Ph: [phone] or [phone]       Fax: [phone]   RxID:   [id number]    Orders Added: 1)  Flu Vaccine 2yrs + MEDICARE PATIENTS [Q2039] 2)  Administration Flu vaccine - MCR [G0008] 3)  EKG w/ Interpretation [93000] 4)  Medicare -1st Annual Wellness Visit [G0438] 5)  Est. Patient Level III [25956]

## 2010-08-07 NOTE — Assessment & Plan Note (Signed)
Summary: severe pain in rt arm/?muscle strain/intermittant/cjr   Vital Signs:  Patient profile:   77 year old female Temp:     97.6 degrees F oral BP sitting:   122 / 76  (right arm)  Vitals Entered By: Duard Brady LPN (October 04, 2009 10:06 AM) CC: c/o (R) upper arm pain - in afternoon Is Patient Diabetic? No   Primary Care Provider:  Eleonore Chiquito MD  CC:  c/o (R) upper arm pain - in afternoon.  History of Present Illness: 77 year old patient who presents with a several day history of pain involving the lower aspect of her right upper arm.  For the past few days.  Says she has had no pain when she awakens in the morning, but then developed significant pain on the under aspect of her right upper arm.  She does not describe much in the way of local tenderness, but the pain is aggravated by supination and pronation of the arm.  She has returned from a week at the beach, but there's been no obvious trauma. she does have a history of osteo-arthritis, and does take occasional ibuprofen.  Preventive Screening-Counseling & Management  Alcohol-Tobacco     Smoking Status: never  Allergies (verified): No Known Drug Allergies  Social History: Smoking Status:  never  Physical Exam  General:  Well-developed,well-nourished,in no acute distress; alert,appropriate and cooperative throughout examination Msk:  examination the right upper arm revealed no abnormalities.  There is no localized tenderness or signs of active inflammation.  Radial and ulnar pulses were normal.  Motor strength was normal   Impression & Recommendations:  Problem # 1:  ARM PAIN, RIGHT (ICD-729.5)  Problem # 2:  OSTEOARTHRITIS (ICD-715.90)  The following medications were removed from the medication list:    Vicodin Es 7.5-750 Mg Tabs (Hydrocodone-acetaminophen) .[name] Kitchen... Take 1 tablet by mouth three times a day Her updated medication list for this problem includes:    Adult Aspirin Ec Low Strength 81 Mg  Tbec (Aspirin) .[name] Kitchen... 1 once daily  Complete Medication List: 1)  Hydrochlorothiazide 25 Mg Tabs (Hydrochlorothiazide) .[name] Kitchen.. 1 once daily 2)  Levothyroxine Sodium 75 Mcg Tabs (Levothyroxine sodium) .[name] Kitchen.. 1 once daily 3)  Felodipine 5 Mg Tb24 (Felodipine) .[name] Kitchen.. 1 once daily 4)  Adult Aspirin Ec Low Strength 81 Mg Tbec (Aspirin) .[name] Kitchen.. 1 once daily 5)  Pravastatin Sodium 20 Mg Tabs (Pravastatin sodium) .... One daily 6)  Prednisone 20 Mg Tabs (Prednisone) .... Uad 7)  Flexeril 5 Mg Tabs (Cyclobenzaprine hcl) .... Take 1 tablet by mouth three times a day  Patient Instructions: 1)  Take 400-600mg  of Ibuprofen (Advil, Motrin) with food every 4-6 hours as needed for relief of pain or comfort of fever.

## 2010-10-26 ENCOUNTER — Encounter: Payer: Self-pay | Admitting: Internal Medicine

## 2010-10-31 ENCOUNTER — Ambulatory Visit (INDEPENDENT_AMBULATORY_CARE_PROVIDER_SITE_OTHER): Payer: Medicare Other | Admitting: Internal Medicine

## 2010-10-31 ENCOUNTER — Encounter: Payer: Self-pay | Admitting: Internal Medicine

## 2010-10-31 DIAGNOSIS — E039 Hypothyroidism, unspecified: Secondary | ICD-10-CM

## 2010-10-31 DIAGNOSIS — I10 Essential (primary) hypertension: Secondary | ICD-10-CM

## 2010-10-31 DIAGNOSIS — E785 Hyperlipidemia, unspecified: Secondary | ICD-10-CM

## 2010-10-31 NOTE — Patient Instructions (Signed)
Limit your sodium (Salt) intake  Take a calcium supplement, plus 800-1200 units of vitamin D    It is important that you exercise regularly, at least 20 minutes 3 to 4 times per week.  If you develop chest pain or shortness of breath seek  medical attention.  Return in 6 months for follow-up 

## 2010-11-01 ENCOUNTER — Encounter: Payer: Self-pay | Admitting: Internal Medicine

## 2010-11-01 NOTE — Progress Notes (Signed)
  Subjective:    Patient ID: [name], female    DOB: August 18, 1924, 77 y.o.   MRN: [id number]  HPI  77 year old patient who is in today for her six-month followup. Medical problems include hypertension. This has been well controlled on diuretic therapy as well as felodipine. She has hypothyroidism and has been on supplemental Synthroid 75 mcg daily she has dyslipidemia controlled on pravastatin 40 mg daily. She has done well and denies any cardiopulmonary complaints she has a history of arthritis which has been fairly benign   Review of Systems  Constitutional: Negative.   HENT: Negative for hearing loss, congestion, sore throat, rhinorrhea, dental problem, sinus pressure and tinnitus.   Eyes: Negative for pain, discharge and visual disturbance.  Respiratory: Negative for cough and shortness of breath.   Cardiovascular: Negative for chest pain, palpitations and leg swelling.  Gastrointestinal: Negative for nausea, vomiting, abdominal pain, diarrhea, constipation, blood in stool and abdominal distention.  Genitourinary: Negative for dysuria, urgency, frequency, hematuria, flank pain, vaginal bleeding, vaginal discharge, difficulty urinating, vaginal pain and pelvic pain.  Musculoskeletal: Negative for joint swelling, arthralgias and gait problem.  Skin: Negative for rash.  Neurological: Negative for dizziness, syncope, speech difficulty, weakness, numbness and headaches.  Hematological: Negative for adenopathy.  Psychiatric/Behavioral: Negative for behavioral problems, dysphoric mood and agitation. The patient is not nervous/anxious.        Objective:   Physical Exam  Constitutional: She is oriented to person, place, and time. She appears well-developed and well-nourished.  HENT:  Head: Normocephalic.  Right Ear: External ear normal.  Left Ear: External ear normal.  Mouth/Throat: Oropharynx is clear and moist.  Eyes: Conjunctivae and EOM are normal. Pupils are equal, round, and  reactive to light.  Neck: Normal range of motion. Neck supple. No thyromegaly present.  Cardiovascular: Normal rate, regular rhythm, normal heart sounds and intact distal pulses.   Pulmonary/Chest: Effort normal and breath sounds normal.  Abdominal: Soft. Bowel sounds are normal. She exhibits no mass. There is no tenderness.  Musculoskeletal: Normal range of motion.  Lymphadenopathy:    She has no cervical adenopathy.  Neurological: She is alert and oriented to person, place, and time.  Skin: Skin is warm and dry. No rash noted.  Psychiatric: She has a normal mood and affect. Her behavior is normal.          Assessment & Plan:  Hypertension stable. We'll continue present dual therapy. Low salt diet regular exercise encouraged Hypothyroidism stable Dyslipidemia. We'll continue Pravachol 40 mg daily.  We'll see in 6 months for her annual exam

## 2010-11-13 ENCOUNTER — Other Ambulatory Visit: Payer: Self-pay | Admitting: Physician Assistant

## 2010-11-19 ENCOUNTER — Other Ambulatory Visit: Payer: Self-pay | Admitting: Internal Medicine

## 2010-11-23 NOTE — H&P (Signed)
[name], [name] NO.:  [id number]   MEDICAL RECORD NO.:  [id number]          PATIENT TYPE:  INP   LOCATION:                               FACILITY:  [facility]   PHYSICIAN:  Gordy Savers, M.D. LHCDATE OF BIRTH:  1924-10-18   DATE OF ADMISSION:  03/28/2005  DATE OF DISCHARGE:                                HISTORY & PHYSICAL   HISTORY OF PRESENT ILLNESS:  The patient is a 76 year old white female with  a history of hypercholesterolemia and also benign PVC's.  She was stable  until last week, when seven days ago she experienced severe pre-syncopal  symptoms.  For a period of time she needed to lay down, for fear of frank  syncope.  She was quite weak and lightheaded, but no diaphoresis.  Her  symptoms resolved after several minutes.  She was stable until yesterday,  when she experienced an episode of weakness and nausea that was associated  with some diarrhea.  This was short-lived and again felt well until the day  of admission.  For the past several hours she has been quite dizzy with  increased sense of palpitations.  She has been lightheaded to the point  where she felt that she was going to faint on a number of occasions, if she  could not sit or lay flat.  The patient has been followed with a slightly  depressed TSH for a number of years.  This has not changed.  Her last TSH  was 0.29 with a normal range of 0.35 to 5.6.  In the office, the patient was  noted to be slightly hypertensive without orthostatic changes.  Electrocardiogram revealed a normal sinus rhythm with frequent PVC's.  There  was some mild inferolateral ST-T wave changes, slightly more prominent  compared to prior tracings in the past.  The patient is now admitted for  further evaluation of her weakness and dizziness, associated with  palpitations.   PAST MEDICAL/SURGICAL HISTORY:  1.  The patient was hospitalized in 1992, and did have a normal heart      catheterization at that  time.  2.  She has a history of mild hypercholesterolemia, controlled with statin      therapy.  3.  She has had a prior right cataract surgery.  4.  Cholecystectomy.  5.  She is gravida 4, para 4, abortion 0.  6.  She has been seen by Dr. Ellwood Dense in the past for arthritis.  7.  She has had some arthroscopic surgery involving the right knee.   ALLERGIES:  No known drug allergies.   SOCIAL HISTORY:  She is a former smoker, but discontinued approximately 20  years ago.  She is a retired Runner, broadcasting/film/video.  Presently a non-smoker.   MEDICATIONS:  1.  Lipitor 10 mg daily.  2.  Aspirin 325 mg q.o.d.  3.  Glucosamine p.r.n.   REVIEW OF SYSTEMS:  Otherwise really noncontributory.  She had a colonoscopy  in September 2004.   FAMILY HISTORY:  Father deceased at age 46, of a myocardial infarction.  Mother died  at age 46, of breast cancer.  One brother is well.   PHYSICAL EXAMINATION:  GENERAL:  An elderly white female, who appeared to be  slightly weak, especially with movement, either sitting to supine or supine  to the sitting position.  No orthostatic blood pressure change demonstrated.  VITAL SIGNS:  Blood pressure 170/90.  SKIN:  Warm and dry without rash.  HEENT/NECK:  Slight iridectomy scar on the right nd a cataract noted on the  left.  Ears, nose and throat were normal.  The neck revealed no bruits or  neck vein distention.  CHEST:  Clear.  CARDIOVASCULAR:  Revealed normal S1, S2.  Frequent ectopics.  No murmur  noted.  ABDOMEN:  Soft, nontender.  No organomegaly.  Right upper quadrant scar  noted.  EXTREMITIES:  No edema.  Peripheral pulses intact.   IMPRESSION:  1.  Weakness, palpitations, rule out complex ventricular tachyarrhythmias.  2.  History of suppressed thyroid stimulating hormone, now with increased      palpitations and slight tremor, rule out frank hyperthyroidism.  3.  Hypercholesterolemia.   DISPOSITION:  The patient will be admitted to the hospital and  observed in a  telemetry setting.  Serial cardiac enzymes and laboratory studies will be  reviewed, including a TSH.  She will be considered for a stress Myoview and  the 2-D echocardiogram will also be reviewed.           ______________________________  Gordy Savers, M.D. [facility]     PFK/MEDQ  D:  03/28/2005  T:  03/29/2005  Job:  [id number]

## 2010-11-23 NOTE — Discharge Summary (Signed)
[name], [name] NO.:  [id number]   MEDICAL RECORD NO.:  [id number]          PATIENT TYPE:  INP   LOCATION:  4728                         FACILITY:  [facility]   PHYSICIAN:  Gordy Savers, M.D. LHCDATE OF BIRTH:  03-Jul-1925   DATE OF ADMISSION:  03/28/2005  DATE OF DISCHARGE:  03/30/2005                                 DISCHARGE SUMMARY   FINAL DIAGNOSES:  1.  Ventricular ectopy.  2.  Weakness.  3.  Primary hyperthyroidism.  4.  Hypertension.   DISCHARGE MEDICATIONS:  1.  Toprol XL 100 mg daily.  2.  Lipitor 10 mg daily.  3.  Aspirin 325 daily.   HISTORY OF PRESENT ILLNESS:  The patient is a 77 year old white female with  a history of hypercholesterolemia and benign PVCs.  For the past week she  had several episodes of weakness and presyncopal symptoms.  On the day of  admission she was quite weak and dizzy for several hours with a sense of  sense of increasing palpitations.  She was admitted for further evaluation  and treatment of her weakness, dizziness associated with palpitations.   LABORATORY DATA AND HOSPITAL COURSE:  The patient was admitted to a  telemetry setting where she was monitored during the entire hospital stay.  This revealed frequent PVCs and some bigeminy but no other complex rhythm  disturbances.  She remained fairly asymptomatic here in the hospital.  Serial cardiac enzymes were unremarkable.  Cholesterol was 207 with LDL  cholesterol of 136.  TSH was suppressed at 0.259.  A single troponin level  was slightly elevated at 0.08 but subsequent readings were normal.   Electrocardiogram revealed sinus rhythm with frequent PVCs and some minor  STT wave changes.   During the hospital course, the patient was placed on beta-blocker therapy  and did quite well.  A 2-D echocardiogram  was performed, resulting pending  at the time of this dictation.   DISPOSITION:  Patient will be discharged on the medical regimen listed  above.  She  will be seen in follow-up next week where a thyroid scan and  uptake will be obtained as well as stress Myoview.   CONDITION ON DISCHARGE:  Stable.           ______________________________  Gordy Savers, M.D. [facility]    PFK/MEDQ  D:  03/30/2005  T:  04/01/2005  Job:  [id number]

## 2010-11-23 NOTE — Assessment & Plan Note (Signed)
[facility]                            [facility] OFFICE NOTE   NAME:[name], Gloria MIKELS                        MRN:          [id number]  DATE:10/13/2006                            DOB:          [birth year]    Patient is a 77 year old patient seen today for an annual exam.  She has  hyperlipidemia, hypothyroidism, hypertension.  She is status post R131  treatment for Graves disease.  She has history of DJD, status post right  knee arthroscopic surgery.  She has had a right cataract extraction  surgery.  She has had a remote cholecystectomy, gravida 4, para 4,  abortus 0, did have a heart catheterization in 1992.  Was also admitted  to the hospital briefly in 2006, after an episode of pre-syncope.  The  heart catheterization and stress test were normal.   EXAMINATION:  GENERAL:  Elderly white female who appeared quite well.  VITAL SIGNS:  Blood pressure was well controlled.  HEAD AND NECK:  Revealed normal fundi.  EAR, NOSE AND THROAT:  Clear.  Dentures in place.  NECK:  No bruits.  CHEST:  Clear.  CARDIOVASCULAR:  Revealed normal heart sounds, no murmurs.  RESPIRATORY:  Negative.  ABDOMEN:  Soft and nontender, no organomegaly.  EXTREMITIES:  Revealed diminished pedal pulses on the right.  There is  no edema.  NEURO:  Negative.  PELVIC:  Exam revealed a normal cervix, no adnexal masses.  Stool was  heme-tests negative.   IMPRESSION:  1. Hypercholesterolemia.  2. Menopausal syndrome.  3. Hypertension.  4. Degenerative joint disease.   DISPOSITION:  Medical regimen went unchanged.   LABORATORY STUDIES:  Including TSH will be reviewed or re-assessed in 6  months.     Gordy Savers, MD  Electronically Signed    PFK/MedQ  DD: 10/13/2006  DT: 10/13/2006  Job #: [id number]

## 2010-11-23 NOTE — Consult Note (Signed)
[name], [name] NO.:  [id number]   MEDICAL RECORD NO.:  [id number]          PATIENT TYPE:  INP   LOCATION:  4728                         FACILITY:  [facility]   PHYSICIAN:  Rollene Rotunda, M.D.   DATE OF BIRTH:  08/20/1924   DATE OF CONSULTATION:  03/29/2005  DATE OF DISCHARGE:                                   CONSULTATION   REASON FOR CONSULTATION:  Pre-syncope and elevated troponin.   HISTORY OF PRESENT ILLNESS:  The patient is a very pleasant 77 year old  white female who did have some shortness of breath a few years ago. She had  a cardiac catheterization with no evidence of coronary disease by her  report. She usually does well, though she is a little limited by knee pain.  She climbs stairs and does vacuuming in her house without any symptoms.  However, one week ago, she was sitting [place] the beach. She was  quiet. She felt a sudden dizziness. She put her head down. She ate some  candy and this resolved after several minutes. She did not report  diaphoresis, palpitations, any vertiginous symptoms, pain, or shortness of  breath. Two days prior to this admission, she had a similar episode again  while sitting. This was again several minutes. She went to run errands  afterwards and felt bad just with fatigue. She went home and had a diarrheal  episode. Yesterday, while playing bridge, she again had dizziness. None of  these have been related to positions. She has been sitting with each one.  She cannot bring on any of these symptoms with activities. She has had no  frank syncope. She is not having any orthostatic type of symptoms. She  denies any chest discomfort, neck discomfort, arm discomfort, nausea and  vomiting, or diaphoresis and she has felt no palpitations. She went to her  primary care physician. She was noted to be hypertensive yesterday. She had  PVC's. She is noted on labs to have one elevated troponin of 0.08.  Otherwise, three sets  of cardiac enzymes have been negative including two  sets of troponin's. Labs do include a TSH of 0.259.   PAST MEDICAL HISTORY:  Dyslipidemia x10 years and degenerative joint  disease. There is no history of hypertension or diabetes.   PAST SURGICAL HISTORY:  Cholecystectomy, right knee surgery.   ALLERGIES:  None.   CURRENT MEDICATIONS:  1.  Lipitor 10 mg q.d.  2.  Aspirin 325 mg q.d. (the patient has been on metoprolol 50 mg b.i.d.      here).   SOCIAL HISTORY:  The patient lives in [place]. She lives alone. She has 4  children. She quit smoking 20 years ago. She drinks wine, a couple of  glasses per day. She drinks 2 cups of caffeine in the morning.   FAMILY HISTORY:  Is contributory for her father dying of myocardial  infarction at age 72.   REVIEW OF SYSTEMS:  As stated in the HPI and negative for other systems.   PHYSICAL EXAMINATION:  GENERAL:  The patient is in no distress.  VITAL SIGNS:  Blood pressure 140/70, heart rate 78 and regular. Afebrile.  HEENT:  Eyes unremarkable. Pupils are equal, round, and reactive to light.  Fundi not visualized. Oral mucosa unremarkable.  NECK:  No jugular venous distention. Wave form within normal limits. Carotid  upstroke brisk and symmetric. No bruits. No thyromegaly.  LYMPH:  No cervical, axillary, or inguinal adenopathy.  LUNGS:  Clear to auscultation bilaterally.  BACK:  No costovertebral angle tenderness.  CHEST:  Unremarkable.  HEART:  PMI not displaced or sustained. S1 and S2 within normal limits. No  S3, no S4, and no murmurs.  ABDOMEN:  Flat. Positive bowel sounds. Normal in frequency and pitch. No  bruits. No rebound. No guarding. __________ No mass, hepatomegaly, or  splenomegaly.  SKIN:  No rashes. No nodules.  EXTREMITIES:  2+ pulses throughout. No edema, cyanosis, or clubbing.  NEUROLOGIC:  Oriented to person, place, and time. Cranial nerves 2-12 are  grossly intact. Motor grossly intact.   LABORATORY DATA:  EKG:   Sinus rhythm, axis within normal limits, intervals  within normal limits, premature ventricular complexes, poor anterior R-wave  progression. Echocardiogram:  Normal wall motion, normal ejection fraction.   ASSESSMENT/PLAN:  1.  DIZZINESS:  The patient is having episodes of dizziness. It may be      dysrhythmia. It is possible that this could be related to thyroid      disorder, which is being investigated. I doubt that this is related to      any obstructive coronary disease. She certainly has premature      ventricular contractions but she is not really noticing these. She will      remain on telemetry. She, if we do not capture any of these on      telemetry, will need an event monitor. Will wait and see what her T3 and      T4 are and what management is suggested by primary care physician. She      is encouraged to stop drinking caffeine in the meantime.   1.  ELEVATED TROPONIN:  The patient had 1 elevated troponin. She has, by her      report, no coronary disease two years ago. I will try to retrieve this.      I would probably pursue a stress perfusion study, which could be done as      an outpatient. I will cycle another set of enzymes.           ______________________________  Rollene Rotunda, M.D.     JH/MEDQ  D:  03/29/2005  T:  03/31/2005  Job:  [id number]   cc:   Gordy Savers, M.D. [facility]  [address] [place]  [place] [address]

## 2010-11-26 ENCOUNTER — Other Ambulatory Visit: Payer: Self-pay | Admitting: Internal Medicine

## 2010-11-26 DIAGNOSIS — Z1231 Encounter for screening mammogram for malignant neoplasm of breast: Secondary | ICD-10-CM

## 2010-11-30 ENCOUNTER — Ambulatory Visit
Admission: RE | Admit: 2010-11-30 | Discharge: 2010-11-30 | Disposition: A | Discharge status: Home / Self Care | Payer: Medicare Other | Source: Ambulatory Visit | Attending: Internal Medicine | Admitting: Internal Medicine

## 2010-11-30 DIAGNOSIS — Z1231 Encounter for screening mammogram for malignant neoplasm of breast: Secondary | ICD-10-CM

## 2010-12-08 ENCOUNTER — Other Ambulatory Visit: Payer: Self-pay | Admitting: Internal Medicine

## 2011-01-30 ENCOUNTER — Other Ambulatory Visit: Payer: Self-pay | Admitting: Internal Medicine

## 2011-02-21 ENCOUNTER — Other Ambulatory Visit: Payer: Self-pay | Admitting: Internal Medicine

## 2011-05-01 ENCOUNTER — Ambulatory Visit (INDEPENDENT_AMBULATORY_CARE_PROVIDER_SITE_OTHER): Payer: Medicare Other | Admitting: Internal Medicine

## 2011-05-01 ENCOUNTER — Encounter: Payer: Self-pay | Admitting: Internal Medicine

## 2011-05-01 DIAGNOSIS — E785 Hyperlipidemia, unspecified: Secondary | ICD-10-CM

## 2011-05-01 DIAGNOSIS — M5137 Other intervertebral disc degeneration, lumbosacral region: Secondary | ICD-10-CM

## 2011-05-01 DIAGNOSIS — Z23 Encounter for immunization: Secondary | ICD-10-CM

## 2011-05-01 DIAGNOSIS — E039 Hypothyroidism, unspecified: Secondary | ICD-10-CM

## 2011-05-01 DIAGNOSIS — Z0000 Encounter for general adult medical examination without abnormal findings: Secondary | ICD-10-CM

## 2011-05-01 DIAGNOSIS — R0602 Shortness of breath: Secondary | ICD-10-CM

## 2011-05-01 DIAGNOSIS — Z136 Encounter for screening for cardiovascular disorders: Secondary | ICD-10-CM

## 2011-05-01 DIAGNOSIS — I10 Essential (primary) hypertension: Secondary | ICD-10-CM

## 2011-05-01 LAB — CBC WITH DIFFERENTIAL/PLATELET
Basophils Absolute: 0 10*3/uL (ref 0.0–0.1)
Basophils Relative: 0.5 % (ref 0.0–3.0)
Eosinophils Absolute: 0.3 10*3/uL (ref 0.0–0.7)
Lymphocytes Relative: 21.8 % (ref 12.0–46.0)
MCHC: 33.9 g/dL (ref 30.0–36.0)
Monocytes Relative: 12.6 % — ABNORMAL HIGH (ref 3.0–12.0)
Neutrophils Relative %: 60.9 % (ref 43.0–77.0)
RBC: 4.59 Mil/uL (ref 3.87–5.11)

## 2011-05-01 LAB — COMPREHENSIVE METABOLIC PANEL
AST: 20 U/L (ref 0–37)
Albumin: 4.1 g/dL (ref 3.5–5.2)
BUN: 14 mg/dL (ref 6–23)
CO2: 33 mEq/L — ABNORMAL HIGH (ref 19–32)
Calcium: 9.1 mg/dL (ref 8.4–10.5)
Chloride: 97 mEq/L (ref 96–112)
Creatinine, Ser: 1 mg/dL (ref 0.4–1.2)
GFR: 58.87 mL/min — ABNORMAL LOW (ref >60.00)
Glucose, Bld: 108 mg/dL — ABNORMAL HIGH (ref 70–99)
Potassium: 3.6 mEq/L (ref 3.5–5.1)

## 2011-05-01 LAB — LIPID PANEL
Cholesterol: 214 mg/dL — ABNORMAL HIGH (ref 0–200)
VLDL: 22.2 mg/dL (ref 0.0–40.0)

## 2011-05-01 LAB — TSH: TSH: 0.6 u[IU]/mL (ref 0.35–5.50)

## 2011-05-01 LAB — LDL CHOLESTEROL, DIRECT: Direct LDL: 141.7 mg/dL

## 2011-05-01 MED ORDER — PRAVASTATIN SODIUM 40 MG PO TABS: 40.0000 mg | ORAL_TABLET | Freq: Every day | ORAL | Status: DC

## 2011-05-01 MED ORDER — LEVOTHYROXINE SODIUM 75 MCG PO TABS: 75.0000 ug | ORAL_TABLET | Freq: Every day | ORAL | Status: DC

## 2011-05-01 MED ORDER — HYDROCHLOROTHIAZIDE 25 MG PO TABS: 25.0000 mg | ORAL_TABLET | Freq: Every day | ORAL | Status: DC

## 2011-05-01 MED ORDER — FELODIPINE ER 5 MG PO TB24: 5.0000 mg | ORAL_TABLET | Freq: Every day | ORAL | Status: DC

## 2011-05-01 NOTE — Patient Instructions (Signed)
Limit your sodium (Salt) intake  Please check your blood pressure on a regular basis.  If it is consistently greater than 150/90, please make an office appointment.    It is important that you exercise regularly, at least 20 minutes 3 to 4 times per week.  If you develop chest pain or shortness of breath seek  medical attention.  Take a calcium supplement, plus 800-1200 units of vitamin D 

## 2011-05-01 NOTE — Progress Notes (Signed)
Subjective:    Patient ID: [name], female    DOB: August 29, 1924, 77 y.o.   MRN: [id number]  HPI  CC: cpx - doing well.   History of Present Illness:   77 year-old patient who is seen today for a wellness exam. She has treated hypertension, osteoarthritis, dyslipidemia, and hypothyroidism. She does remarkably well.   Here for Medicare AWV:   1. Risk factors based on Past M, S, F history:cardiovascular risk factors include hypertension, and dyslipidemia. She also has a family history of coronary artery disease  2. Physical Activities: remains active without exercise limitations  3. Depression/mood: no history of depression or mood disorder  4. Hearing: no deficits  5. ADL's: independent in all aspects of daily living  6. Fall Risk: low  7. Home Safety: no problems identified  8. Height, weight, &visual acuity:height and weight stable. No difficulty with visual acuity  9. Counseling: heart healthy diet more regular exercise restricted salt diet. All discussed and encouraged calcium and vitamin D supplementation. Encouraged  10. Labs ordered based on risk factors: laboratory studies were done 3 months ago  11. Referral Coordination- none appropriate at this time  12. Care Plan- heart healthy diet and compliance with medications. All encouraged  13. Cognitive Assessment- alert and oriented, with normal affect; no memory concerns handles all executive functioning without difficulty   Preventive Screening-Counseling & Management  Alcohol-Tobacco  Smoking Status: quit  Allergies (verified):  No Known Drug Allergies   Past History:  Past Medical History:  Reviewed history from 09/30/2008 and no changes required.  Graves disease, S/P R131  history or presyncope. September 2006  COLONIC POLYPS, HYPERPLASTIC, HX OF (ICD-V12.72)  COLONIC POLYPS, ADENOMATOUS, HX OF (ICD-V12.72)  DIVERTICULOSIS, COLON (ICD-562.10)  DYSPNEA (ICD-786.05)  WEAKNESS (ICD-780.79)  HYPOTHYROIDISM  (ICD-244.9)  OSTEOARTHRITIS (ICD-715.90)  HYPERTENSION (ICD-401.9)  HYPERLIPIDEMIA (ICD-272.4)   Past Surgical History:  Reviewed history from 09/30/2008 and no changes required.  Cataract extraction-right eye  Knee surgery -right  Cholecystectomy  heart catheterization, 1992  Angioplasty  gravida 4, para 4, abortus zero  status post I-131 2006  colonoscopy September 2004  adenosine Myoview study September 2006 ejection fraction 66%  2-D echocardiogram September 2006 with ejection fraction 55 to 65%   Family History:  Reviewed history from 09/30/2008 and no changes required.  father died at 8 of myocardial infarction  mother died 28, breast cancer  One brother-  Family History of Stomach Cancer: MGM  No FH of Colon Cancer:  Social History:  Reviewed history from 09/30/2008 and no changes required.  Patient is a former smoker. -Stopped 25 years ago  Alcohol Use - yes-2 glasses daily  Daily Caffeine Use- 2 cups daily  Illicit Drug Use - no  Smoking Status: quit     Review of Systems  Constitutional: Negative for fever, appetite change, fatigue and unexpected weight change.  HENT: Negative for hearing loss, ear pain, nosebleeds, congestion, sore throat, mouth sores, trouble swallowing, neck stiffness, dental problem, voice change, sinus pressure and tinnitus.   Eyes: Negative for photophobia, pain, redness and visual disturbance.  Respiratory: Negative for cough, chest tightness and shortness of breath.   Cardiovascular: Negative for chest pain, palpitations and leg swelling.  Gastrointestinal: Negative for nausea, vomiting, abdominal pain, diarrhea, constipation, blood in stool, abdominal distention and rectal pain.  Genitourinary: Negative for dysuria, urgency, frequency, hematuria, flank pain, vaginal bleeding, vaginal discharge, difficulty urinating, genital sores, vaginal pain, menstrual problem and pelvic pain.  Musculoskeletal: Negative for back pain and  arthralgias.    Skin: Negative for rash.  Neurological: Negative for dizziness, syncope, speech difficulty, weakness, light-headedness, numbness and headaches.  Hematological: Negative for adenopathy. Does not bruise/bleed easily.  Psychiatric/Behavioral: Negative for suicidal ideas, behavioral problems, self-injury, dysphoric mood and agitation. The patient is not nervous/anxious.        Objective:   Physical Exam  Constitutional: She is oriented to person, place, and time. She appears well-developed and well-nourished.  HENT:  Head: Normocephalic and atraumatic.  Right Ear: External ear normal.  Left Ear: External ear normal.  Mouth/Throat: Oropharynx is clear and moist.  Eyes: Conjunctivae and EOM are normal.  Neck: Normal range of motion. Neck supple. No JVD present. No thyromegaly present.  Cardiovascular: Normal rate, regular rhythm, normal heart sounds and intact distal pulses.   No murmur heard. Pulmonary/Chest: Effort normal and breath sounds normal. She has no wheezes. She has no rales.  Abdominal: Soft. Bowel sounds are normal. She exhibits no distension and no mass. There is no tenderness. There is no rebound and no guarding.  Musculoskeletal: Normal range of motion. She exhibits no edema and no tenderness.  Neurological: She is alert and oriented to person, place, and time. She has normal reflexes. No cranial nerve deficit. She exhibits normal muscle tone. Coordination normal.  Skin: Skin is warm and dry. No rash noted.  Psychiatric: She has a normal mood and affect. Her behavior is normal.          Assessment & Plan:   Preventive health examination Hypothyroidism. We'll check a TSH and continue Synthroid Hypertension well controlled Dyslipidemia. Will continue pravastatin a lipid profile will be checked  Recheck in 6 months

## 2011-06-21 ENCOUNTER — Telehealth: Payer: Self-pay | Admitting: *Deleted

## 2011-06-21 NOTE — Telephone Encounter (Signed)
agree

## 2011-06-21 NOTE — Telephone Encounter (Signed)
No fever, coughing x 3 days but better day. Sleeping all night without coughing. Taking Vi-q-tuss syrup from 2008. No Wheezing or SOB. Advised pt to try Mucinex OTC but if starts to run a fever, wheezing or sob then needs to come in.

## 2011-10-30 ENCOUNTER — Ambulatory Visit (INDEPENDENT_AMBULATORY_CARE_PROVIDER_SITE_OTHER): Payer: Medicare Other | Admitting: Internal Medicine

## 2011-10-30 ENCOUNTER — Encounter: Payer: Self-pay | Admitting: Internal Medicine

## 2011-10-30 VITALS — BP 120/70 | Temp 97.3°F | Wt 157.0 lb

## 2011-10-30 DIAGNOSIS — I10 Essential (primary) hypertension: Secondary | ICD-10-CM

## 2011-10-30 DIAGNOSIS — E785 Hyperlipidemia, unspecified: Secondary | ICD-10-CM

## 2011-10-30 DIAGNOSIS — E039 Hypothyroidism, unspecified: Secondary | ICD-10-CM

## 2011-10-30 NOTE — Progress Notes (Signed)
  Subjective:    Patient ID: [name], female    DOB: Jun 01, 1925, 78 y.o.   MRN: [id number]  HPI  an 78 year old patient who is in today for followup. She has a history of hypertension hypothyroidism and dyslipidemia. Today is her six-month followup. She was seen for her annual exam 6 months ago. Laboratory studies were reviewed. She is doing remarkably well at 78.    Review of Systems  Constitutional: Negative.   HENT: Negative for hearing loss, congestion, sore throat, rhinorrhea, dental problem, sinus pressure and tinnitus.   Eyes: Negative for pain, discharge and visual disturbance.  Respiratory: Negative for cough and shortness of breath.   Cardiovascular: Negative for chest pain, palpitations and leg swelling.  Gastrointestinal: Negative for nausea, vomiting, abdominal pain, diarrhea, constipation, blood in stool and abdominal distention.  Genitourinary: Negative for dysuria, urgency, frequency, hematuria, flank pain, vaginal bleeding, vaginal discharge, difficulty urinating, vaginal pain and pelvic pain.  Musculoskeletal: Negative for joint swelling, arthralgias and gait problem.  Skin: Negative for rash.  Neurological: Negative for dizziness, syncope, speech difficulty, weakness, numbness and headaches.  Hematological: Negative for adenopathy.  Psychiatric/Behavioral: Negative for behavioral problems, dysphoric mood and agitation. The patient is not nervous/anxious.        Objective:   Physical Exam  Constitutional: She is oriented to person, place, and time. She appears well-developed and well-nourished.  HENT:  Head: Normocephalic.  Right Ear: External ear normal.  Left Ear: External ear normal.  Mouth/Throat: Oropharynx is clear and moist.  Eyes: Conjunctivae and EOM are normal. Pupils are equal, round, and reactive to light.  Neck: Normal range of motion. Neck supple. No thyromegaly present.  Cardiovascular: Normal rate, regular rhythm and normal heart sounds.     Absent right posterior tibial pulse  Pulmonary/Chest: Effort normal and breath sounds normal.  Abdominal: Soft. Bowel sounds are normal. She exhibits no mass. There is no tenderness.  Musculoskeletal: Normal range of motion.  Lymphadenopathy:    She has no cervical adenopathy.  Neurological: She is alert and oriented to person, place, and time.  Skin: Skin is warm and dry. No rash noted.  Psychiatric: She has a normal mood and affect. Her behavior is normal.          Assessment & Plan:  Hypertension well controlled Dyslipidemia stable Hypothyroidism stable  Since 6 months for her annual health assessment

## 2011-10-30 NOTE — Patient Instructions (Signed)
Limit your sodium (Salt) intake    It is important that you exercise regularly, at least 20 minutes 3 to 4 times per week.  If you develop chest pain or shortness of breath seek  medical attention. 

## 2011-12-11 ENCOUNTER — Other Ambulatory Visit: Payer: Self-pay | Admitting: Internal Medicine

## 2011-12-11 DIAGNOSIS — Z1231 Encounter for screening mammogram for malignant neoplasm of breast: Secondary | ICD-10-CM

## 2011-12-20 ENCOUNTER — Ambulatory Visit
Admission: RE | Admit: 2011-12-20 | Discharge: 2011-12-20 | Disposition: A | Discharge status: Home / Self Care | Payer: Medicare Other | Source: Ambulatory Visit | Attending: Internal Medicine | Admitting: Internal Medicine

## 2011-12-20 DIAGNOSIS — Z1231 Encounter for screening mammogram for malignant neoplasm of breast: Secondary | ICD-10-CM

## 2011-12-26 ENCOUNTER — Other Ambulatory Visit: Payer: Self-pay | Admitting: Internal Medicine

## 2012-01-30 ENCOUNTER — Other Ambulatory Visit: Payer: Self-pay | Admitting: Internal Medicine

## 2012-02-25 ENCOUNTER — Telehealth: Payer: Self-pay | Admitting: Internal Medicine

## 2012-02-25 NOTE — Telephone Encounter (Signed)
This was sent to me.

## 2012-02-25 NOTE — Telephone Encounter (Signed)
Caller: Rita/Patient; Patient Name: [name]; PCP: Eleonore Chiquito; Best Callback Phone Number: [phone]; Reason for call:  Asks if she needs to stop Aspirin.  She has a cyst on her face - almost penny sized (getting larger recently)  and has appointment with Dermatology on  03/05/12 and anticipates this will need to be removed.  She also asks when it should be stopped and how soon after procedure she resume Aspirin 81 per day.    Note to  provider for response per Medication Questions protocol.

## 2012-02-25 NOTE — Telephone Encounter (Signed)
Spoke with pt- informed ok to continue asa

## 2012-02-25 NOTE — Telephone Encounter (Signed)
Please advise 

## 2012-02-25 NOTE — Telephone Encounter (Signed)
Okay to continue aspirin for this minor dermatologic procedure

## 2012-04-29 ENCOUNTER — Ambulatory Visit (INDEPENDENT_AMBULATORY_CARE_PROVIDER_SITE_OTHER): Payer: Medicare Other | Admitting: Internal Medicine

## 2012-04-29 ENCOUNTER — Encounter: Payer: Self-pay | Admitting: Internal Medicine

## 2012-04-29 VITALS — BP 130/74 | HR 72 | Temp 97.9°F | Resp 18 | Ht 65.0 in | Wt 153.0 lb

## 2012-04-29 DIAGNOSIS — M1990 Unspecified osteoarthritis, unspecified site: Secondary | ICD-10-CM

## 2012-04-29 DIAGNOSIS — M5137 Other intervertebral disc degeneration, lumbosacral region: Secondary | ICD-10-CM

## 2012-04-29 DIAGNOSIS — E785 Hyperlipidemia, unspecified: Secondary | ICD-10-CM

## 2012-04-29 DIAGNOSIS — E039 Hypothyroidism, unspecified: Secondary | ICD-10-CM

## 2012-04-29 DIAGNOSIS — Z0000 Encounter for general adult medical examination without abnormal findings: Secondary | ICD-10-CM

## 2012-04-29 DIAGNOSIS — M51379 Other intervertebral disc degeneration, lumbosacral region without mention of lumbar back pain or lower extremity pain: Secondary | ICD-10-CM

## 2012-04-29 DIAGNOSIS — Z23 Encounter for immunization: Secondary | ICD-10-CM

## 2012-04-29 DIAGNOSIS — I10 Essential (primary) hypertension: Secondary | ICD-10-CM

## 2012-04-29 LAB — CBC WITH DIFFERENTIAL/PLATELET
Eosinophils Relative: 2.5 % (ref 0.0–5.0)
Lymphocytes Relative: 22.4 % (ref 12.0–46.0)
Monocytes Relative: 11 % (ref 3.0–12.0)
Neutrophils Relative %: 63.5 % (ref 43.0–77.0)
Platelets: 229 10*3/uL (ref 150.0–400.0)
WBC: 6.2 10*3/uL (ref 4.5–10.5)

## 2012-04-29 LAB — LIPID PANEL
HDL: 61.2 mg/dL (ref >39.00)
Total CHOL/HDL Ratio: 3
VLDL: 22.2 mg/dL (ref 0.0–40.0)

## 2012-04-29 LAB — COMPREHENSIVE METABOLIC PANEL
ALT: 14 U/L (ref 0–35)
AST: 18 U/L (ref 0–37)
Chloride: 96 mEq/L (ref 96–112)
Creatinine, Ser: 1 mg/dL (ref 0.4–1.2)
Sodium: 137 mEq/L (ref 135–145)
Total Bilirubin: 0.8 mg/dL (ref 0.3–1.2)
Total Protein: 7.3 g/dL (ref 6.0–8.3)

## 2012-04-29 MED ORDER — PRAVASTATIN SODIUM 40 MG PO TABS: 40.0000 mg | ORAL_TABLET | Freq: Every day | ORAL | Status: DC

## 2012-04-29 MED ORDER — LEVOTHYROXINE SODIUM 75 MCG PO TABS: 75.0000 ug | ORAL_TABLET | Freq: Every day | ORAL | Status: DC

## 2012-04-29 MED ORDER — FELODIPINE ER 5 MG PO TB24: 5.0000 mg | ORAL_TABLET | Freq: Every day | ORAL | Status: DC

## 2012-04-29 MED ORDER — HYDROCHLOROTHIAZIDE 25 MG PO TABS: 25.0000 mg | ORAL_TABLET | Freq: Every day | ORAL | Status: DC

## 2012-04-29 NOTE — Progress Notes (Signed)
  Subjective:    Patient ID: [name], female    DOB: [birth year], 78 y.o.   MRN: [id number]  HPI  Wt Readings from Last 3 Encounters:  04/29/12 153 lb (69.4 kg)  10/30/11 157 lb (71.215 kg)  05/01/11 162 lb (73.483 kg)    Review of Systems     Objective:   Physical Exam        Assessment & Plan:

## 2012-04-29 NOTE — Progress Notes (Signed)
Patient ID: EBBIE SORENSON, [name]   DOB: 12/10/1924, 78 y.o.   MRN: [id number]  Subjective:    Patient ID: [name], [name]    [name], 78 y.o.   MRN: [id number]  Hypertension Pertinent negatives include no chest pain, headaches, palpitations or shortness of breath.  Hyperlipidemia Pertinent negatives include no chest pain or shortness of breath.    CC: cpx - doing well.   History of Present Illness:   78 year-old patient who is seen today for a wellness exam. She has treated hypertension, osteoarthritis, dyslipidemia, and hypothyroidism. She does remarkably well.   Here for Medicare AWV:   1. Risk factors based on Past M, S, F history:cardiovascular risk factors include hypertension, and dyslipidemia. She also has a family history of coronary artery disease  2. Physical Activities: remains active without exercise limitations  3. Depression/mood: no history of depression or mood disorder  4. Hearing: no deficits  5. ADL's: independent in all aspects of daily living  6. Fall Risk: low  7. Home Safety: no problems identified  8. Height, weight, &visual acuity:height and weight stable. No difficulty with visual acuity  9. Counseling: heart healthy diet more regular exercise restricted salt diet. All discussed and encouraged calcium and vitamin D supplementation. Encouraged  10. Labs ordered based on risk factors: laboratory studies were done 3 months ago  11. Referral Coordination- none appropriate at this time  12. Care Plan- heart healthy diet and compliance with medications. All encouraged  13. Cognitive Assessment- alert and oriented, with normal affect; no memory concerns handles all executive functioning without difficulty   Preventive Screening-Counseling & Management  Alcohol-Tobacco  Smoking Status: quit  Allergies (verified):  No Known Drug Allergies   Past History:  Past Medical History:  Reviewed history from 09/30/2008 and no changes required.  Graves  disease, S/P R131  history or presyncope. September 2006  COLONIC POLYPS, HYPERPLASTIC, HX OF (ICD-V12.72)  COLONIC POLYPS, ADENOMATOUS, HX OF (ICD-V12.72)  DIVERTICULOSIS, COLON (ICD-562.10)  DYSPNEA (ICD-786.05)  WEAKNESS (ICD-780.79)  HYPOTHYROIDISM (ICD-244.9)  OSTEOARTHRITIS (ICD-715.90)  HYPERTENSION (ICD-401.9)  HYPERLIPIDEMIA (ICD-272.4)   Past Surgical History:  Reviewed history from 09/30/2008 and no changes required.  Cataract extraction-right eye  Knee surgery -right  Cholecystectomy  heart catheterization, 1992  Angioplasty  gravida 4, para 4, abortus zero  status post I-131 2006  colonoscopy September 2004  adenosine Myoview study September 2006 ejection fraction 66%  2-D echocardiogram September 2006 with ejection fraction 55 to 65%   Family History:  Reviewed history from 09/30/2008 and no changes required.  father died at 18 of myocardial infarction  mother died 65, breast cancer  One brother-  Family History of Stomach Cancer: MGM  No FH of Colon Cancer:  Social History:  Reviewed history from 09/30/2008 and no changes required.  Patient is a former smoker. -Stopped 25 years ago  Alcohol Use - yes-2 glasses daily  Daily Caffeine Use- 2 cups daily  Illicit Drug Use - no  Smoking Status: quit     Review of Systems  Constitutional: Negative for fever, appetite change, fatigue and unexpected weight change.  HENT: Negative for hearing loss, ear pain, nosebleeds, congestion, sore throat, mouth sores, trouble swallowing, neck stiffness, dental problem, voice change, sinus pressure and tinnitus.   Eyes: Negative for photophobia, pain, redness and visual disturbance.  Respiratory: Negative for cough, chest tightness and shortness of breath.   Cardiovascular: Negative for chest pain, palpitations and leg swelling.  Gastrointestinal: Negative for nausea, vomiting, abdominal  pain, diarrhea, constipation, blood in stool, abdominal distention and rectal pain.    Genitourinary: Negative for dysuria, urgency, frequency, hematuria, flank pain, vaginal bleeding, vaginal discharge, difficulty urinating, genital sores, vaginal pain, menstrual problem and pelvic pain.  Musculoskeletal: Negative for back pain and arthralgias.  Skin: Negative for rash.  Neurological: Negative for dizziness, syncope, speech difficulty, weakness, light-headedness, numbness and headaches.  Hematological: Negative for adenopathy. Does not bruise/bleed easily.  Psychiatric/Behavioral: Negative for suicidal ideas, behavioral problems, self-injury, dysphoric mood and agitation. The patient is not nervous/anxious.        Objective:   Physical Exam  Constitutional: She is oriented to person, place, and time. She appears well-developed and well-nourished.  HENT:  Head: Normocephalic and atraumatic.  Right Ear: External ear normal.  Left Ear: External ear normal.  Mouth/Throat: Oropharynx is clear and moist.  Eyes: Conjunctivae normal and EOM are normal.  Neck: Normal range of motion. Neck supple. No JVD present. No thyromegaly present.  Cardiovascular: Normal rate, regular rhythm, normal heart sounds and intact distal pulses.   No murmur heard. Pulmonary/Chest: Effort normal and breath sounds normal. She has no wheezes. She has no rales.  Abdominal: Soft. Bowel sounds are normal. She exhibits no distension and no mass. There is no tenderness. There is no rebound and no guarding.  Musculoskeletal: Normal range of motion. She exhibits no edema and no tenderness.  Neurological: She is alert and oriented to person, place, and time. She has normal reflexes. No cranial nerve deficit. She exhibits normal muscle tone. Coordination normal.  Skin: Skin is warm and dry. No rash noted.  Psychiatric: She has a normal mood and affect. Her behavior is normal.          Assessment & Plan:   Preventive health examination Hypothyroidism. We'll check a TSH and continue  Synthroid Hypertension well controlled Dyslipidemia. Will continue pravastatin a lipid profile will be checked  Recheck in 6 months

## 2012-04-29 NOTE — Patient Instructions (Signed)
Limit your sodium (Salt) intake    It is important that you exercise regularly, at least 20 minutes 3 to 4 times per week.  If you develop chest pain or shortness of breath seek  medical attention.  Please check your blood pressure on a regular basis.  If it is consistently greater than 150/90, please make an office appointment.  Return in one year for follow-up  

## 2012-05-01 LAB — TSH: TSH: 0.87 u[IU]/mL (ref 0.35–5.50)

## 2012-05-10 ENCOUNTER — Other Ambulatory Visit: Payer: Self-pay | Admitting: Internal Medicine

## 2012-06-21 ENCOUNTER — Other Ambulatory Visit: Payer: Self-pay | Admitting: Internal Medicine

## 2012-07-15 ENCOUNTER — Ambulatory Visit (INDEPENDENT_AMBULATORY_CARE_PROVIDER_SITE_OTHER): Payer: Medicare PPO | Admitting: Family Medicine

## 2012-07-15 ENCOUNTER — Encounter: Payer: Self-pay | Admitting: Family Medicine

## 2012-07-15 VITALS — BP 150/74 | HR 105 | Temp 97.4°F | Wt 152.0 lb

## 2012-07-15 DIAGNOSIS — I10 Essential (primary) hypertension: Secondary | ICD-10-CM

## 2012-07-15 DIAGNOSIS — J069 Acute upper respiratory infection, unspecified: Secondary | ICD-10-CM

## 2012-07-15 MED ORDER — BENZONATATE 100 MG PO CAPS: 100.0000 mg | ORAL_CAPSULE | Freq: Three times a day (TID) | ORAL | Status: DC | PRN

## 2012-07-15 NOTE — Progress Notes (Signed)
Chief Complaint  Patient presents with  . Cough    hoarseness    HPI:  Congestion/Cough: -started: 4 days ago -symptoms:nasal congestion, sore throat, cough - sore throat improved, but cough persisting -denies:fever, SOB, NVD, tooth pain, strep, flu or mono exposure -has tried: musinex - helps a little -sick contacts: none known   HTN: -a little elevated today initially at 150/74 -pt reports it is never up like this -denies: CP, SOB, palpitations, swelling -took her medications today - felodipine and hctz    ROS: See pertinent positives and negatives per HPI.  Past Medical History  Diagnosis Date  . DISC DISEASE, LUMBAR 12/16/2008  . DIVERTICULOSIS, COLON 09/30/2008  . DYSPNEA 07/13/2008  . HYPERLIPIDEMIA 03/06/2007  . HYPERTENSION 03/06/2007  . HYPOTHYROIDISM 10/13/2007  . OSTEOARTHRITIS 03/06/2007  . Personal history of colonic polyps 09/30/2008  . TOBACCO USE, QUIT 04/12/2009  . WEAKNESS 11/09/2007  . Graves disease     Family History  Problem Relation Age of Onset  . Cancer Maternal Grandmother     stomach    History   Social History  . Marital Status: Divorced    Spouse Name: N/A    Number of Children: N/A  . Years of Education: N/A   Social History Main Topics  . Smoking status: Former Smoker    Quit date: 07/08/1978  . Smokeless tobacco: Never Used  . Alcohol Use: Yes  . Drug Use: No  . Sexually Active: None   Other Topics Concern  . None   Social History Narrative  . None    Current outpatient prescriptions:aspirin 81 MG tablet, Take 81 mg by mouth daily.  , Disp: , Rfl: ;  felodipine (PLENDIL) 5 MG 24 hr tablet, TAKE 1 TABLET EVERY DAY, Disp: 90 tablet, Rfl: 1;  hydrochlorothiazide (HYDRODIURIL) 25 MG tablet, TAKE 1 TABLET EVERY DAY, Disp: 90 tablet, Rfl: 3;  levothyroxine (SYNTHROID, LEVOTHROID) 75 MCG tablet, TAKE 1 TABLET BY MOUTH EVERY DAY, Disp: 90 tablet, Rfl: 3 pravastatin (PRAVACHOL) 40 MG tablet, Take 1 tablet (40 mg total) by mouth daily.,  Disp: 90 tablet, Rfl: 3;  benzonatate (TESSALON PERLES) 100 MG capsule, Take 1 capsule (100 mg total) by mouth 3 (three) times daily as needed for cough., Disp: 20 capsule, Rfl: 0  EXAM:  Filed Vitals:   07/15/12 0901  BP: 150/74  Pulse: 105  Temp: 97.4 F (36.3 C)    There is no height on file to calculate BMI.  GENERAL: vitals reviewed and listed above, alert, oriented, appears well hydrated and in no acute distress  HEENT: atraumatic, conjunttiva clear, no obvious abnormalities on inspection of external nose and ears, normal appearance of ear canals and TMs, clear nasal congestion, mild post oropharyngeal erythema with PND, no tonsillar edema or exudate, no sinus TTP  NECK: no obvious masses on inspection  LUNGS: clear to auscultation bilaterally, no wheezes, rales or rhonchi, good air movement  CV: HRRR, no peripheral edema  MS: moves all extremities without noticeable abnormality  PSYCH: pleasant and cooperative, no obvious depression or anxiety  ASSESSMENT AND PLAN:  Discussed the following assessment and plan:  1. Upper respiratory infection  -likely viral, improving -discussed options for cough and will try tessalon perles - discussed risks -supportive care and return precuations benzonatate (TESSALON PERLES) 100 MG capsule  2. HYPERTENSION  -improved after sitting for 5 minutes to 138/80 -she will follow up with PCP in 1-2 months and can recheck then Continue current meds and follow up for recheck in  1-2 months    -Patient advised to return or notify a doctor immediately if symptoms worsen or persist or new concerns arise.  There are no Patient Instructions on file for this visit.   Kriste Basque R.

## 2012-07-15 NOTE — Patient Instructions (Signed)
INSTRUCTIONS FOR UPPER RESPIRATORY INFECTION:  -plenty of rest and fluids  -nasal saline wash 2-3 times daily (use prepackaged nasal saline or bottled/distilled water if making your own)   -can use sinex nasal spray for drainage and nasal congestion - but do NOT use longer then 3-4 days  -can use tylenol or ibuprofen as directed for aches and sorethroat  -in the winter time, using a humidifier at night is helpful (please follow cleaning instructions)  -if you are taking a cough medication - use only as directed, may also try a teaspoon of honey to coat the throat and throat lozenges  -for sore throat, salt water gargles can help  -follow up if you have fevers, facial pain, tooth pain, difficulty breathing or are worsening or not getting better in 5-7 days  FOLLOW UP IN 1-2 MONTHS TO RECHECK BLOOD PRESSURE

## 2012-08-10 ENCOUNTER — Ambulatory Visit: Payer: Medicare PPO | Admitting: Family Medicine

## 2012-08-10 ENCOUNTER — Telehealth: Payer: Self-pay | Admitting: Internal Medicine

## 2012-08-10 ENCOUNTER — Other Ambulatory Visit: Payer: Self-pay | Admitting: *Deleted

## 2012-08-10 MED ORDER — PROMETHAZINE HCL 50 MG RE SUPP: 50.0000 mg | Freq: Four times a day (QID) | RECTAL | Status: DC | PRN

## 2012-08-10 NOTE — Telephone Encounter (Signed)
Micheale states Phenergan Suppositories 50mg  were called in to the pharmacy.  Pharmacist told Ragena they would have to order them and that it was a very strong medication.  Crickett would like something that is not as strong to be called in instead.  Please f/u with pt.

## 2012-08-10 NOTE — Telephone Encounter (Signed)
Per Dr Tawanna Cooler.  Rx sent to pharmacy and patient is aware.

## 2012-08-11 NOTE — Telephone Encounter (Signed)
[name] please call.......... if she still nauseated she can cut the 50 mg suppositories and half water she feels better don't take anything.[name] Kitchen

## 2012-08-17 ENCOUNTER — Encounter: Payer: Self-pay | Admitting: Family Medicine

## 2012-08-17 ENCOUNTER — Ambulatory Visit (INDEPENDENT_AMBULATORY_CARE_PROVIDER_SITE_OTHER)
Admission: RE | Admit: 2012-08-17 | Discharge: 2012-08-17 | Disposition: A | Discharge status: Home / Self Care | Payer: Medicare PPO | Source: Ambulatory Visit | Attending: Family Medicine | Admitting: Family Medicine

## 2012-08-17 ENCOUNTER — Ambulatory Visit (INDEPENDENT_AMBULATORY_CARE_PROVIDER_SITE_OTHER): Payer: Medicare PPO | Admitting: Family Medicine

## 2012-08-17 VITALS — BP 160/70 | HR 88 | Temp 97.5°F | Wt 149.0 lb

## 2012-08-17 DIAGNOSIS — R0602 Shortness of breath: Secondary | ICD-10-CM

## 2012-08-17 DIAGNOSIS — R5381 Other malaise: Secondary | ICD-10-CM

## 2012-08-17 DIAGNOSIS — R5383 Other fatigue: Secondary | ICD-10-CM

## 2012-08-17 LAB — BASIC METABOLIC PANEL
Calcium: 9 mg/dL (ref 8.4–10.5)
GFR: 63.23 mL/min (ref >60.00)
Glucose, Bld: 108 mg/dL — ABNORMAL HIGH (ref 70–99)
Potassium: 3.1 mEq/L — ABNORMAL LOW (ref 3.5–5.1)
Sodium: 134 mEq/L — ABNORMAL LOW (ref 135–145)

## 2012-08-17 LAB — CBC WITH DIFFERENTIAL/PLATELET
Eosinophils Absolute: 0.2 10*3/uL (ref 0.0–0.7)
MCHC: 33.8 g/dL (ref 30.0–36.0)
MCV: 89.9 fl (ref 78.0–100.0)
Monocytes Absolute: 0.7 10*3/uL (ref 0.1–1.0)
Neutrophils Relative %: 73.5 % (ref 43.0–77.0)
Platelets: 394 10*3/uL (ref 150.0–400.0)
WBC: 7 10*3/uL (ref 4.5–10.5)

## 2012-08-17 LAB — HEPATIC FUNCTION PANEL
ALT: 24 U/L (ref 0–35)
AST: 19 U/L (ref 0–37)
Bilirubin, Direct: 0.1 mg/dL (ref 0.0–0.3)
Total Bilirubin: 0.6 mg/dL (ref 0.3–1.2)

## 2012-08-17 LAB — POCT URINALYSIS DIPSTICK
Bilirubin, UA: NEGATIVE
Glucose, UA: NEGATIVE
Leukocytes, UA: NEGATIVE
Nitrite, UA: NEGATIVE
Urobilinogen, UA: 2

## 2012-08-17 LAB — TSH: TSH: 1.12 u[IU]/mL (ref 0.35–5.50)

## 2012-08-17 NOTE — Patient Instructions (Signed)
Today ,,,,,,,,, liquid diet  Labs now  Go to the main office for a chest x-ray now  Set up a followup appointment tomorrow to see Dr. Kirtland Bouchard

## 2012-08-17 NOTE — Progress Notes (Signed)
  Subjective:    Patient ID: [name], female    DOB: 19-Mar-1925, 79 y.o.   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 X. Smoker....... Who comes in with a ten-day history of not feeling well  She states she was here couple weeks ago and saw Dr. Selena Batten and was diagnosed to have a viral type syndrome at that point she didn't feel good head congestion runny nose and slight cough. The symptoms seem to abate in about 10 days ago she developed some nausea. No fever chills or vomiting. Her balance the normal except she noted a dark bowel movement a day or 2 after she took a multivitamin with iron. She stopped her multivitamin with iron and her bowel movements are back to normal.  Her medications are unchanged  She states she's lost 3 pounds in the past week in addition to the nausea and no other symptoms. She sleeps well at night   Review of Systems    review of systems otherwise negative Objective:   Physical Exam Thin female no acute distress HEENT negative except for bilateral cerumen impactions neck was supple no adenopathy lungs are clear except for decreased breath sounds bilaterally consistent with COPD from chronic tobacco abuse.  The abdomen was flat bowel sounds are normal scar right upper quadrant from previous cholecystectomy. No tenderness no masses       Assessment & Plan:  Nausea weight loss cough

## 2012-08-18 ENCOUNTER — Telehealth: Payer: Self-pay | Admitting: *Deleted

## 2012-08-18 ENCOUNTER — Encounter: Payer: Medicare PPO | Admitting: Internal Medicine

## 2012-08-18 MED ORDER — AZITHROMYCIN 250 MG PO TABS: 500.0000 mg | ORAL_TABLET | Freq: Every day | ORAL | Status: DC

## 2012-08-18 MED ORDER — LEVOFLOXACIN 500 MG PO TABS: 500.0000 mg | ORAL_TABLET | Freq: Every day | ORAL | Status: DC

## 2012-08-18 NOTE — Telephone Encounter (Signed)
Spoke to pt told her labs are okay, some are abnormal but that is because you are sick and Dr. Amador Cunas said will discuss at 2 week follow up. Pt verbalized understanding.

## 2012-08-18 NOTE — Telephone Encounter (Signed)
Spoke to pt told her chest x-ray showed she has right lower lobe pneumonia and needs to start on 2 antibiotics per Dr. Amador Cunas. Pt verbalized understanding. Told pt Rx;s for Azthromycin and Levaquin were sent to pharmacy. Needs to complete both abx's and follow up in 2 weeks per Dr. Amador Cunas. Pt verbalized understanding. Pt asked about lab results. Told pt will have to get back to her on results.

## 2012-09-01 ENCOUNTER — Encounter: Payer: Self-pay | Admitting: Internal Medicine

## 2012-09-01 ENCOUNTER — Ambulatory Visit (INDEPENDENT_AMBULATORY_CARE_PROVIDER_SITE_OTHER): Payer: Medicare PPO | Admitting: Internal Medicine

## 2012-09-01 VITALS — BP 140/70 | HR 95 | Temp 97.4°F | Resp 20 | Wt 150.0 lb

## 2012-09-01 DIAGNOSIS — J189 Pneumonia, unspecified organism: Secondary | ICD-10-CM

## 2012-09-01 DIAGNOSIS — M1990 Unspecified osteoarthritis, unspecified site: Secondary | ICD-10-CM

## 2012-09-01 DIAGNOSIS — I10 Essential (primary) hypertension: Secondary | ICD-10-CM

## 2012-09-01 NOTE — Patient Instructions (Signed)
Followup chest x-ray as discussed  Limit your sodium (Salt) intake  Please check your blood pressure on a regular basis.  If it is consistently greater than 150/90, please make an office appointment.  Return in 6 months for follow-up

## 2012-09-01 NOTE — Progress Notes (Signed)
Subjective:    Patient ID: [name], female    DOB: 19-Jul-1924, 79 y.o.   MRN: [id number]  HPI  79 year old patient who is seen today in followup. She was treated recently for a right lower lobe pneumonia and has completed antibiotic therapy. She denies any recurrent pulmonary complaints and states that she now feels well and is back to baseline. Denies any cough She has treated hypertension dyslipidemia and osteoarthritis. She remains quite stable.  Past Medical History  Diagnosis Date  . DISC DISEASE, LUMBAR 12/16/2008  . DIVERTICULOSIS, COLON 09/30/2008  . DYSPNEA 07/13/2008  . HYPERLIPIDEMIA 03/06/2007  . HYPERTENSION 03/06/2007  . HYPOTHYROIDISM 10/13/2007  . OSTEOARTHRITIS 03/06/2007  . Personal history of colonic polyps 09/30/2008  . TOBACCO USE, QUIT 04/12/2009  . WEAKNESS 11/09/2007  . Graves disease     History   Social History  . Marital Status: Divorced    Spouse Name: N/A    Number of Children: N/A  . Years of Education: N/A   Occupational History  . Not on file.   Social History Main Topics  . Smoking status: Former Smoker    Quit date: 07/08/1978  . Smokeless tobacco: Never Used  . Alcohol Use: Yes  . Drug Use: No  . Sexually Active: Not on file   Other Topics Concern  . Not on file   Social History Narrative  . No narrative on file    Past Surgical History  Procedure Laterality Date  . Cataract extraction    . Knee surgery    . Cholecystectomy    . Cardiac catheterization      Family History  Problem Relation Age of Onset  . Cancer Maternal Grandmother     stomach    No Known Allergies  Current Outpatient Prescriptions on File Prior to Visit  Medication Sig Dispense Refill  . aspirin 81 MG tablet Take 81 mg by mouth daily.        . felodipine (PLENDIL) 5 MG 24 hr tablet TAKE 1 TABLET EVERY DAY  90 tablet  1  . hydrochlorothiazide (HYDRODIURIL) 25 MG tablet TAKE 1 TABLET EVERY DAY  90 tablet  3  . levothyroxine (SYNTHROID, LEVOTHROID) 75  MCG tablet TAKE 1 TABLET BY MOUTH EVERY DAY  90 tablet  3  . pravastatin (PRAVACHOL) 40 MG tablet Take 1 tablet (40 mg total) by mouth daily.  90 tablet  3  . promethazine (PROMETHEGAN) 50 MG suppository Place 1 suppository (50 mg total) rectally every 6 (six) hours as needed for nausea.  12 each  0   No current facility-administered medications on file prior to visit.    BP 140/70  Pulse 95  Temp(Src) 97.4 F (36.3 C) (Oral)  Resp 20  Wt 150 lb (68.04 kg)  BMI 24.96 kg/m2  SpO2 90%       Review of Systems  Constitutional: Negative.   HENT: Negative for hearing loss, congestion, sore throat, rhinorrhea, dental problem, sinus pressure and tinnitus.   Eyes: Negative for pain, discharge and visual disturbance.  Respiratory: Negative for cough and shortness of breath.   Cardiovascular: Negative for chest pain, palpitations and leg swelling.  Gastrointestinal: Negative for nausea, vomiting, abdominal pain, diarrhea, constipation, blood in stool and abdominal distention.  Genitourinary: Negative for dysuria, urgency, frequency, hematuria, flank pain, vaginal bleeding, vaginal discharge, difficulty urinating, vaginal pain and pelvic pain.  Musculoskeletal: Negative for joint swelling, arthralgias and gait problem.  Skin: Negative for rash.  Neurological: Negative for dizziness, syncope, speech  difficulty, weakness, numbness and headaches.  Hematological: Negative for adenopathy.  Psychiatric/Behavioral: Negative for behavioral problems, dysphoric mood and agitation. The patient is not nervous/anxious.        Objective:   Physical Exam  Constitutional: She is oriented to person, place, and time. She appears well-developed and well-nourished.  HENT:  Head: Normocephalic.  Right Ear: External ear normal.  Left Ear: External ear normal.  Mouth/Throat: Oropharynx is clear and moist.  Eyes: Conjunctivae and EOM are normal. Pupils are equal, round, and reactive to light.  Neck: Normal  range of motion. Neck supple. No thyromegaly present.  Cardiovascular: Normal rate, regular rhythm, normal heart sounds and intact distal pulses.   Pulmonary/Chest: Effort normal and breath sounds normal. No respiratory distress. She has no wheezes. She has no rales.  Abdominal: Soft. Bowel sounds are normal. She exhibits no mass. There is no tenderness.  Musculoskeletal: Normal range of motion.  Lymphadenopathy:    She has no cervical adenopathy.  Neurological: She is alert and oriented to person, place, and time.  Skin: Skin is warm and dry. No rash noted.  Psychiatric: She has a normal mood and affect. Her behavior is normal.          Assessment & Plan:   Status post right lower lobe pneumonia. Clinically resolved Hypertension stable  We'll check a followup chest x-ray in 6 weeks  Recheck 6 months or as needed

## 2012-10-01 ENCOUNTER — Telehealth: Payer: Self-pay | Admitting: Internal Medicine

## 2012-10-01 NOTE — Telephone Encounter (Signed)
We received notification that the pt did not complete the chest films ordered by Dr. Kirtland Bouchard on 09-01-12. Please call pt to complete or cancel order if not needed. Thank you.

## 2012-10-05 NOTE — Telephone Encounter (Signed)
Ok to cancel order

## 2012-10-14 ENCOUNTER — Ambulatory Visit: Payer: Medicare PPO | Admitting: Internal Medicine

## 2012-10-20 ENCOUNTER — Other Ambulatory Visit: Payer: Self-pay | Admitting: Internal Medicine

## 2012-10-20 ENCOUNTER — Ambulatory Visit (INDEPENDENT_AMBULATORY_CARE_PROVIDER_SITE_OTHER)
Admission: RE | Admit: 2012-10-20 | Discharge: 2012-10-20 | Disposition: A | Discharge status: Home / Self Care | Payer: Medicare PPO | Source: Ambulatory Visit | Attending: Internal Medicine | Admitting: Internal Medicine

## 2012-10-20 DIAGNOSIS — J189 Pneumonia, unspecified organism: Secondary | ICD-10-CM

## 2012-10-26 ENCOUNTER — Telehealth: Payer: Self-pay | Admitting: Internal Medicine

## 2012-10-26 DIAGNOSIS — J189 Pneumonia, unspecified organism: Secondary | ICD-10-CM

## 2012-10-26 NOTE — Telephone Encounter (Signed)
Spoke to pt told her pneumonia has not completely cleared and will need a followup chest x-ray in 4-6 weeks. Pt verbalized understanding.

## 2012-10-26 NOTE — Telephone Encounter (Signed)
Patient returning call to [facility] regarding x-ray results.

## 2012-10-26 NOTE — Telephone Encounter (Signed)
Order done for follow up in 4-6 weeks.

## 2012-11-17 ENCOUNTER — Ambulatory Visit (INDEPENDENT_AMBULATORY_CARE_PROVIDER_SITE_OTHER)
Admission: RE | Admit: 2012-11-17 | Discharge: 2012-11-17 | Disposition: A | Discharge status: Home / Self Care | Payer: Medicare PPO | Source: Ambulatory Visit | Attending: Internal Medicine | Admitting: Internal Medicine

## 2012-11-17 ENCOUNTER — Other Ambulatory Visit: Payer: Self-pay | Admitting: Internal Medicine

## 2012-11-17 ENCOUNTER — Telehealth: Payer: Self-pay | Admitting: *Deleted

## 2012-11-17 DIAGNOSIS — R918 Other nonspecific abnormal finding of lung field: Secondary | ICD-10-CM

## 2012-11-17 DIAGNOSIS — J189 Pneumonia, unspecified organism: Secondary | ICD-10-CM

## 2012-11-17 NOTE — Telephone Encounter (Signed)
Marylu Lund from [facility] called with verbal report on pt. Stated Chest x-ray showed persistent mass like opacity in the linguala, Bronchiogenic carcinoma can not be excluded. Chest CT with contrast recommended per radiologist. Report in EPIC. Told her Marylu Lund thank you, will forward report.

## 2012-11-17 NOTE — Telephone Encounter (Signed)
Please schedule chest CT with contrast

## 2012-11-18 NOTE — Telephone Encounter (Signed)
Pt called back notified of results and order done for CT Chest and sent.

## 2012-11-19 ENCOUNTER — Other Ambulatory Visit: Payer: Self-pay | Admitting: Internal Medicine

## 2012-11-19 DIAGNOSIS — R222 Localized swelling, mass and lump, trunk: Secondary | ICD-10-CM

## 2012-11-25 ENCOUNTER — Other Ambulatory Visit (INDEPENDENT_AMBULATORY_CARE_PROVIDER_SITE_OTHER): Payer: Medicare PPO

## 2012-11-25 DIAGNOSIS — R222 Localized swelling, mass and lump, trunk: Secondary | ICD-10-CM

## 2012-11-25 LAB — CREATININE, SERUM: Creatinine, Ser: 1.1 mg/dL (ref 0.4–1.2)

## 2012-11-26 ENCOUNTER — Ambulatory Visit (INDEPENDENT_AMBULATORY_CARE_PROVIDER_SITE_OTHER)
Admission: RE | Admit: 2012-11-26 | Discharge: 2012-11-26 | Disposition: A | Discharge status: Home / Self Care | Payer: Medicare PPO | Source: Ambulatory Visit | Attending: Internal Medicine | Admitting: Internal Medicine

## 2012-11-26 DIAGNOSIS — R222 Localized swelling, mass and lump, trunk: Secondary | ICD-10-CM

## 2012-11-26 DIAGNOSIS — R918 Other nonspecific abnormal finding of lung field: Secondary | ICD-10-CM

## 2012-11-26 MED ORDER — IOHEXOL 300 MG/ML  SOLN
80.0000 mL | Freq: Once | INTRAMUSCULAR | Status: AC | PRN
  Administered 2012-11-26: 80 mL via INTRAVENOUS

## 2012-12-22 ENCOUNTER — Other Ambulatory Visit: Payer: Self-pay

## 2012-12-22 DIAGNOSIS — Z1231 Encounter for screening mammogram for malignant neoplasm of breast: Secondary | ICD-10-CM

## 2012-12-28 ENCOUNTER — Other Ambulatory Visit: Payer: Self-pay | Admitting: Internal Medicine

## 2013-01-23 ENCOUNTER — Other Ambulatory Visit: Payer: Self-pay | Admitting: Internal Medicine

## 2013-01-25 NOTE — Progress Notes (Signed)
  Subjective:    Patient ID: [name], female    DOB: December 26, 1924, 79 y.o.   MRN: [id number]  HPI    Review of Systems     Objective:   Physical Exam        Assessment & Plan:  Pt not seen

## 2013-02-05 ENCOUNTER — Ambulatory Visit
Admission: RE | Admit: 2013-02-05 | Discharge: 2013-02-05 | Disposition: A | Discharge status: Home / Self Care | Payer: Medicare PPO | Source: Ambulatory Visit

## 2013-02-05 DIAGNOSIS — Z1231 Encounter for screening mammogram for malignant neoplasm of breast: Secondary | ICD-10-CM

## 2013-03-09 ENCOUNTER — Ambulatory Visit (INDEPENDENT_AMBULATORY_CARE_PROVIDER_SITE_OTHER): Payer: Medicare PPO | Admitting: Internal Medicine

## 2013-03-09 ENCOUNTER — Encounter: Payer: Self-pay | Admitting: Internal Medicine

## 2013-03-09 VITALS — BP 150/70 | HR 75 | Temp 97.8°F | Resp 20 | Wt 157.0 lb

## 2013-03-09 DIAGNOSIS — K921 Melena: Secondary | ICD-10-CM

## 2013-03-09 DIAGNOSIS — Z860100 Personal history of colon polyps, unspecified: Secondary | ICD-10-CM

## 2013-03-09 DIAGNOSIS — I10 Essential (primary) hypertension: Secondary | ICD-10-CM

## 2013-03-09 DIAGNOSIS — Z23 Encounter for immunization: Secondary | ICD-10-CM

## 2013-03-09 DIAGNOSIS — K5730 Diverticulosis of large intestine without perforation or abscess without bleeding: Secondary | ICD-10-CM

## 2013-03-09 LAB — CBC WITH DIFFERENTIAL/PLATELET
Basophils Absolute: 0 10*3/uL (ref 0.0–0.1)
HCT: 41 % (ref 36.0–46.0)
Lymphs Abs: 1.2 10*3/uL (ref 0.7–4.0)
Monocytes Absolute: 0.7 10*3/uL (ref 0.1–1.0)
Monocytes Relative: 11.8 % (ref 3.0–12.0)
Neutrophils Relative %: 61.6 % (ref 43.0–77.0)
Platelets: 224 10*3/uL (ref 150.0–400.0)
RDW: 14.9 % — ABNORMAL HIGH (ref 11.5–14.6)

## 2013-03-09 NOTE — Patient Instructions (Signed)
Limit your sodium (Salt) intake  Call if you develop any further black stool  Annual  exam in 3 months

## 2013-03-09 NOTE — Progress Notes (Signed)
Subjective:    Patient ID: [name], female    DOB: 1924-11-19, 79 y.o.   MRN: [id number]  HPI  79 year old patient who is seen today with a chief complaint of "diarrhea".  For the past week she has had loose dark stools in the morning. There is no increase in frequency and basically she is describing 1 lose black stool. She feels quite well without any other GI complaints. No nausea or crampy abdominal pain. She has had no fever. She does have a history of colonic polyps as well as diverticular disease. Her last colonoscopy was in May of 2010.  She states that 2 days ago her stool appeared to normalize but was black again this morning. She did take Pepto-Bismol 2 days ago.  Past Medical History  Diagnosis Date  . DISC DISEASE, LUMBAR 12/16/2008  . DIVERTICULOSIS, COLON 09/30/2008  . DYSPNEA 07/13/2008  . HYPERLIPIDEMIA 03/06/2007  . HYPERTENSION 03/06/2007  . HYPOTHYROIDISM 10/13/2007  . OSTEOARTHRITIS 03/06/2007  . Personal history of colonic polyps 09/30/2008  . TOBACCO USE, QUIT 04/12/2009  . WEAKNESS 11/09/2007  . Graves disease     History   Social History  . Marital Status: Divorced    Spouse Name: N/A    Number of Children: N/A  . Years of Education: N/A   Occupational History  . Not on file.   Social History Main Topics  . Smoking status: Former Smoker    Quit date: 07/08/1978  . Smokeless tobacco: Never Used  . Alcohol Use: Yes  . Drug Use: No  . Sexual Activity: Not on file   Other Topics Concern  . Not on file   Social History Narrative  . No narrative on file    Past Surgical History  Procedure Laterality Date  . Cataract extraction    . Knee surgery    . Cholecystectomy    . Cardiac catheterization      Family History  Problem Relation Age of Onset  . Cancer Maternal Grandmother     stomach    No Known Allergies  Current Outpatient Prescriptions on File Prior to Visit  Medication Sig Dispense Refill  . aspirin 81 MG tablet Take 81 mg by mouth  daily.        . felodipine (PLENDIL) 5 MG 24 hr tablet TAKE 1 TABLET EVERY DAY  90 tablet  1  . hydrochlorothiazide (HYDRODIURIL) 25 MG tablet TAKE 1 TABLET EVERY DAY  90 tablet  3  . levothyroxine (SYNTHROID, LEVOTHROID) 75 MCG tablet TAKE 1 TABLET BY MOUTH EVERY DAY  90 tablet  3  . pravastatin (PRAVACHOL) 40 MG tablet TAKE 1 TABLET BY MOUTH EVERY DAY  90 tablet  1  . promethazine (PROMETHEGAN) 50 MG suppository Place 1 suppository (50 mg total) rectally every 6 (six) hours as needed for nausea.  12 each  0   No current facility-administered medications on file prior to visit.    BP 150/70  Pulse 75  Temp(Src) 97.8 F (36.6 C) (Oral)  Resp 20  Wt 157 lb (71.215 kg)  BMI 26.13 kg/m2  SpO2 95%       Review of Systems  Constitutional: Negative.   HENT: Negative for hearing loss, congestion, sore throat, rhinorrhea, dental problem, sinus pressure and tinnitus.   Eyes: Negative for pain, discharge and visual disturbance.  Respiratory: Negative for cough and shortness of breath.   Cardiovascular: Negative for chest pain, palpitations and leg swelling.  Gastrointestinal: Positive for diarrhea. Negative for nausea, vomiting,  abdominal pain, constipation, blood in stool and abdominal distention.       History of melena for one week  Genitourinary: Negative for dysuria, urgency, frequency, hematuria, flank pain, vaginal bleeding, vaginal discharge, difficulty urinating, vaginal pain and pelvic pain.  Musculoskeletal: Negative for joint swelling, arthralgias and gait problem.  Skin: Negative for rash.  Neurological: Negative for dizziness, syncope, speech difficulty, weakness, numbness and headaches.  Hematological: Negative for adenopathy.  Psychiatric/Behavioral: Negative for behavioral problems, dysphoric mood and agitation. The patient is not nervous/anxious.        Objective:   Physical Exam  Constitutional: She is oriented to person, place, and time. She appears well-developed  and well-nourished.  HENT:  Head: Normocephalic.  Right Ear: External ear normal.  Left Ear: External ear normal.  Mouth/Throat: Oropharynx is clear and moist.  Eyes: Conjunctivae and EOM are normal. Pupils are equal, round, and reactive to light.  Neck: Normal range of motion. Neck supple. No thyromegaly present.  Cardiovascular: Normal rate, regular rhythm, normal heart sounds and intact distal pulses.   Pulmonary/Chest: Effort normal and breath sounds normal.  Abdominal: Soft. Bowel sounds are normal. She exhibits no distension and no mass. There is no tenderness. There is no rebound and no guarding.  Genitourinary: Guaiac negative stool.  Stool is normal color and hematest negative  Musculoskeletal: Normal range of motion.  Lymphadenopathy:    She has no cervical adenopathy.  Neurological: She is alert and oriented to person, place, and time.  Skin: Skin is warm and dry. No rash noted.  Psychiatric: She has a normal mood and affect. Her behavior is normal.          Assessment & Plan:   History melena for one week. No history of iron use but she has taken Pepto-Bismol she states on one occasion. Status post colonoscopy may 2010. We'll check a CBC today and observe Hypertension stable Dyslipidemia Diverticulosis History colonic polyps

## 2013-04-13 ENCOUNTER — Other Ambulatory Visit: Payer: Self-pay | Admitting: Physician Assistant

## 2013-04-28 ENCOUNTER — Other Ambulatory Visit: Payer: Self-pay | Admitting: Internal Medicine

## 2013-05-06 ENCOUNTER — Other Ambulatory Visit: Payer: Self-pay | Admitting: Physician Assistant

## 2013-06-09 ENCOUNTER — Ambulatory Visit (INDEPENDENT_AMBULATORY_CARE_PROVIDER_SITE_OTHER): Payer: Medicare PPO | Admitting: Internal Medicine

## 2013-06-09 ENCOUNTER — Encounter: Payer: Self-pay | Admitting: Internal Medicine

## 2013-06-09 VITALS — BP 130/72 | HR 74 | Temp 97.7°F | Resp 20 | Ht 65.5 in | Wt 160.0 lb

## 2013-06-09 DIAGNOSIS — Z23 Encounter for immunization: Secondary | ICD-10-CM

## 2013-06-09 DIAGNOSIS — E039 Hypothyroidism, unspecified: Secondary | ICD-10-CM

## 2013-06-09 DIAGNOSIS — M1990 Unspecified osteoarthritis, unspecified site: Secondary | ICD-10-CM

## 2013-06-09 DIAGNOSIS — E785 Hyperlipidemia, unspecified: Secondary | ICD-10-CM

## 2013-06-09 DIAGNOSIS — Z0000 Encounter for general adult medical examination without abnormal findings: Secondary | ICD-10-CM

## 2013-06-09 DIAGNOSIS — I10 Essential (primary) hypertension: Secondary | ICD-10-CM

## 2013-06-09 NOTE — Progress Notes (Signed)
Pre-visit discussion using our clinic review tool. No additional management support is needed unless otherwise documented below in the visit note.  

## 2013-06-09 NOTE — Progress Notes (Signed)
Subjective:    Patient ID: [name], female    DOB: 06/20/1925, 78 y.o.   MRN: [id number]  HPI   CC: cpx - doing well.   History of Present Illness:   79 year-old patient who is seen today for a wellness exam. She has treated hypertension, osteoarthritis, dyslipidemia, and hypothyroidism. She does remarkably well.   Here for Medicare AWV:   1. Risk factors based on Past M, S, F history:cardiovascular risk factors include hypertension, and dyslipidemia. She also has a family history of coronary artery disease  2. Physical Activities: remains active without exercise limitations  3. Depression/mood: no history of depression or mood disorder  4. Hearing: no deficits  5. ADL's: independent in all aspects of daily living  6. Fall Risk: low  7. Home Safety: no problems identified  8. Height, weight, &visual acuity:height and weight stable. No difficulty with visual acuity  9. Counseling: heart healthy diet more regular exercise restricted salt diet. All discussed and encouraged calcium and vitamin D supplementation. Encouraged  10. Labs ordered based on risk factors: laboratory studies were done 3 months ago  11. Referral Coordination- none appropriate at this time  12. Care Plan- heart healthy diet and compliance with medications. All encouraged  13. Cognitive Assessment- alert and oriented, with normal affect; no memory concerns handles all executive functioning without difficulty   Preventive Screening-Counseling & Management  Alcohol-Tobacco  Smoking Status: quit  Allergies (verified):  No Known Drug Allergies   Past History:  Past Medical History:   Graves disease, S/P R131  history or presyncope. September 2006  COLONIC POLYPS, HYPERPLASTIC, HX OF (ICD-V12.72)  COLONIC POLYPS, ADENOMATOUS, HX OF (ICD-V12.72)  DIVERTICULOSIS, COLON (ICD-562.10)  DYSPNEA (ICD-786.05)  WEAKNESS (ICD-780.79)  HYPOTHYROIDISM (ICD-244.9)  OSTEOARTHRITIS (ICD-715.90)  HYPERTENSION  (ICD-401.9)  HYPERLIPIDEMIA (ICD-272.4)   Past Surgical History:    Cataract extraction-right eye  Knee surgery -right  Cholecystectomy  heart catheterization, 1992  Angioplasty  gravida 4, para 4, abortus zero  status post I-[id number] 2006  colonoscopy September 2004  2010  adenosine Myoview study September 2006 ejection fraction 66%  2-D echocardiogram September 2006 with ejection fraction 55 to 65%   Family History:    father died at 34 of myocardial infarction  mother died 44, breast cancer  One brother-  Family History of Stomach Cancer: MGM  No FH of Colon Cancer:   Social History:    Patient is a former smoker. -Stopped 25 years ago  Alcohol Use - yes-2 glasses daily  Daily Caffeine Use- 2 cups daily  Illicit Drug Use - no  Smoking Status: quit     Review of Systems  Constitutional: Negative for fever, appetite change, fatigue and unexpected weight change.  HENT: Negative for congestion, dental problem, ear pain, hearing loss, mouth sores, nosebleeds, sinus pressure, sore throat, tinnitus, trouble swallowing and voice change.   Eyes: Negative for photophobia, pain, redness and visual disturbance.  Respiratory: Negative for cough, chest tightness and shortness of breath.   Cardiovascular: Negative for chest pain, palpitations and leg swelling.  Gastrointestinal: Negative for nausea, vomiting, abdominal pain, diarrhea, constipation, blood in stool, abdominal distention and rectal pain.  Genitourinary: Negative for dysuria, urgency, frequency, hematuria, flank pain, vaginal bleeding, vaginal discharge, difficulty urinating, genital sores, vaginal pain, menstrual problem and pelvic pain.  Musculoskeletal: Negative for arthralgias, back pain and neck stiffness.  Skin: Negative for rash.  Neurological: Negative for dizziness, syncope, speech difficulty, weakness, light-headedness, numbness and headaches.  Hematological: Negative for adenopathy. Does  not bruise/bleed easily.   Psychiatric/Behavioral: Negative for suicidal ideas, behavioral problems, self-injury, dysphoric mood and agitation. The patient is not nervous/anxious.        Objective:   Physical Exam  Constitutional: She is oriented to person, place, and time. She appears well-developed and well-nourished.  HENT:  Head: Normocephalic and atraumatic.  Right Ear: External ear normal.  Left Ear: External ear normal.  Mouth/Throat: Oropharynx is clear and moist.  Eyes: Conjunctivae and EOM are normal.  Neck: Normal range of motion. Neck supple. No JVD present. No thyromegaly present.  Cardiovascular: Normal rate, regular rhythm, normal heart sounds and intact distal pulses.   No murmur heard. Pulmonary/Chest: Effort normal and breath sounds normal. She has no wheezes. She has no rales.  Abdominal: Soft. Bowel sounds are normal. She exhibits no distension and no mass. There is no tenderness. There is no rebound and no guarding.  Musculoskeletal: Normal range of motion. She exhibits no edema and no tenderness.  Neurological: She is alert and oriented to person, place, and time. She has normal reflexes. No cranial nerve deficit. She exhibits normal muscle tone. Coordination normal.  Skin: Skin is warm and dry. No rash noted.  Psychiatric: She has a normal mood and affect. Her behavior is normal.          Assessment & Plan:   Preventive health examination Hypothyroidism. We'll check a TSH and continue Synthroid Hypertension well controlled Dyslipidemia. Will continue pravastatin a lipid profile will be checked  Recheck in 6 months

## 2013-06-09 NOTE — Patient Instructions (Signed)
Limit your sodium (Salt) intake Take a calcium supplement, plus 800-1200 units of vitamin D  Return in 6 months for follow-up  

## 2013-07-25 ENCOUNTER — Other Ambulatory Visit: Payer: Self-pay | Admitting: Internal Medicine

## 2013-08-06 ENCOUNTER — Other Ambulatory Visit: Payer: Self-pay | Admitting: Physician Assistant

## 2013-08-26 ENCOUNTER — Other Ambulatory Visit: Payer: Self-pay | Admitting: Physician Assistant

## 2013-09-13 ENCOUNTER — Other Ambulatory Visit: Payer: Self-pay | Admitting: Internal Medicine

## 2013-12-08 ENCOUNTER — Encounter: Payer: Self-pay | Admitting: Internal Medicine

## 2013-12-08 ENCOUNTER — Ambulatory Visit (INDEPENDENT_AMBULATORY_CARE_PROVIDER_SITE_OTHER): Payer: Medicare PPO | Admitting: Internal Medicine

## 2013-12-08 ENCOUNTER — Telehealth: Payer: Self-pay | Admitting: Internal Medicine

## 2013-12-08 VITALS — BP 136/70 | HR 72 | Temp 98.0°F | Resp 20 | Ht 65.5 in | Wt 159.0 lb

## 2013-12-08 DIAGNOSIS — M1990 Unspecified osteoarthritis, unspecified site: Secondary | ICD-10-CM

## 2013-12-08 DIAGNOSIS — E039 Hypothyroidism, unspecified: Secondary | ICD-10-CM

## 2013-12-08 DIAGNOSIS — I10 Essential (primary) hypertension: Secondary | ICD-10-CM

## 2013-12-08 DIAGNOSIS — E785 Hyperlipidemia, unspecified: Secondary | ICD-10-CM

## 2013-12-08 DIAGNOSIS — R0989 Other specified symptoms and signs involving the circulatory and respiratory systems: Secondary | ICD-10-CM

## 2013-12-08 LAB — LIPID PANEL
CHOL/HDL RATIO: 3
CHOLESTEROL: 218 mg/dL — AB (ref 0–200)
HDL: 63.8 mg/dL (ref >39.00)
LDL CALC: 130 mg/dL — AB (ref 0–99)
NonHDL: 154.2
TRIGLYCERIDES: 119 mg/dL (ref 0.0–149.0)
VLDL: 23.8 mg/dL (ref 0.0–40.0)

## 2013-12-08 LAB — COMPREHENSIVE METABOLIC PANEL
ALK PHOS: 84 U/L (ref 39–117)
ALT: 13 U/L (ref 0–35)
AST: 19 U/L (ref 0–37)
Albumin: 4 g/dL (ref 3.5–5.2)
BILIRUBIN TOTAL: 0.8 mg/dL (ref 0.2–1.2)
BUN: 20 mg/dL (ref 6–23)
CO2: 33 mEq/L — ABNORMAL HIGH (ref 19–32)
CREATININE: 1.2 mg/dL (ref 0.4–1.2)
Calcium: 9.2 mg/dL (ref 8.4–10.5)
Chloride: 94 mEq/L — ABNORMAL LOW (ref 96–112)
GFR: 46.57 mL/min — ABNORMAL LOW (ref >60.00)
GLUCOSE: 99 mg/dL (ref 70–99)
POTASSIUM: 3.3 meq/L — AB (ref 3.5–5.1)
Sodium: 136 mEq/L (ref 135–145)
Total Protein: 7.4 g/dL (ref 6.0–8.3)

## 2013-12-08 LAB — CBC WITH DIFFERENTIAL/PLATELET
BASOS PCT: 0.6 % (ref 0.0–3.0)
Basophils Absolute: 0 10*3/uL (ref 0.0–0.1)
EOS ABS: 0.3 10*3/uL (ref 0.0–0.7)
EOS PCT: 3.7 % (ref 0.0–5.0)
HEMATOCRIT: 42.9 % (ref 36.0–46.0)
HEMOGLOBIN: 14.3 g/dL (ref 12.0–15.0)
Lymphocytes Relative: 27.1 % (ref 12.0–46.0)
Lymphs Abs: 1.8 10*3/uL (ref 0.7–4.0)
MCHC: 33.4 g/dL (ref 30.0–36.0)
MCV: 91.1 fl (ref 78.0–100.0)
MONO ABS: 0.9 10*3/uL (ref 0.1–1.0)
Monocytes Relative: 12.6 % — ABNORMAL HIGH (ref 3.0–12.0)
NEUTROS ABS: 3.8 10*3/uL (ref 1.4–7.7)
Neutrophils Relative %: 56 % (ref 43.0–77.0)
Platelets: 244 10*3/uL (ref 150.0–400.0)
RBC: 4.7 Mil/uL (ref 3.87–5.11)
RDW: 13.1 % (ref 11.5–15.5)
WBC: 6.8 10*3/uL (ref 4.0–10.5)

## 2013-12-08 LAB — TSH: TSH: 0.34 u[IU]/mL — ABNORMAL LOW (ref 0.35–4.50)

## 2013-12-08 MED ORDER — FELODIPINE ER 5 MG PO TB24: ORAL_TABLET | ORAL | Status: DC

## 2013-12-08 NOTE — Progress Notes (Signed)
Pre-visit discussion using our clinic review tool. No additional management support is needed unless otherwise documented below in the visit note.  

## 2013-12-08 NOTE — Progress Notes (Signed)
Subjective:    Patient ID: [name], female    DOB: [birth year], 80 y.o.   MRN: [id number]  HPI  80 year old patient who is in today for her six-month followup.  She has done remarkably well.  She has treated hypertension, dyslipidemia, and hypothyroidism.  She remains quite active at 80, and has no cardiopulmonary complaints.  She has osteoarthritis, which has been stable.  Past Medical History  Diagnosis Date  . [place] DISEASE, LUMBAR 12/16/2008  . DIVERTICULOSIS, COLON 09/30/2008  . DYSPNEA 07/13/2008  . HYPERLIPIDEMIA 03/06/2007  . HYPERTENSION 03/06/2007  . HYPOTHYROIDISM 10/13/2007  . OSTEOARTHRITIS 03/06/2007  . Personal history of colonic polyps 09/30/2008  . TOBACCO USE, QUIT 04/12/2009  . WEAKNESS 11/09/2007  . Graves disease     History   Social History  . Marital Status: Divorced    Spouse Name: N/A    Number of Children: N/A  . Years of Education: N/A   Occupational History  . Not on file.   Social History Main Topics  . Smoking status: Former Smoker    Quit date: 07/08/1978  . Smokeless tobacco: Never Used  . Alcohol Use: Yes  . Drug Use: No  . Sexual Activity: Not on file   Other Topics Concern  . Not on file   Social History Narrative  . No narrative on file    Past Surgical History  Procedure Laterality Date  . Cataract extraction    . Knee surgery    . Cholecystectomy    . Cardiac catheterization      Family History  Problem Relation Age of Onset  . Cancer Maternal Grandmother     stomach    No Known Allergies  Current Outpatient Prescriptions on File Prior to Visit  Medication Sig Dispense Refill  . aspirin 81 MG tablet Take 81 mg by mouth daily.        . hydrochlorothiazide (HYDRODIURIL) 25 MG tablet TAKE 1 TABLET (25 MG TOTAL) BY MOUTH DAILY.  90 tablet  3  . levothyroxine (SYNTHROID, LEVOTHROID) 75 MCG tablet TAKE 1 TABLET (75 MCG TOTAL) BY MOUTH DAILY.  90 tablet  3  . pravastatin (PRAVACHOL) 40 MG tablet TAKE 1 TABLET BY MOUTH EVERY  DAY  90 tablet  1   No current facility-administered medications on file prior to visit.    BP 136/70  Pulse 72  Temp(Src) 98 F (36.7 C) (Oral)  Resp 20  Ht 5' 5.5" (1.664 m)  Wt 159 lb (72.122 kg)  BMI 26.05 kg/m2  SpO2 95%       Review of Systems  Constitutional: Negative.   HENT: Negative for congestion, dental problem, hearing loss, rhinorrhea, sinus pressure, sore throat and tinnitus.   Eyes: Negative for pain, discharge and visual disturbance.  Respiratory: Negative for cough and shortness of breath.   Cardiovascular: Negative for chest pain, palpitations and leg swelling.  Gastrointestinal: Negative for nausea, vomiting, abdominal pain, diarrhea, constipation, blood in stool and abdominal distention.  Genitourinary: Negative for dysuria, urgency, frequency, hematuria, flank pain, vaginal bleeding, vaginal discharge, difficulty urinating, vaginal pain and pelvic pain.  Musculoskeletal: Negative for arthralgias, gait problem and joint swelling.  Skin: Negative for rash.  Neurological: Negative for dizziness, syncope, speech difficulty, weakness, numbness and headaches.  Hematological: Negative for adenopathy.  Psychiatric/Behavioral: Negative for behavioral problems, dysphoric mood and agitation. The patient is not nervous/anxious.        Objective:   Physical Exam  Constitutional: She is oriented to person, place, and  time. She appears well-developed and well-nourished.  HENT:  Head: Normocephalic.  Right Ear: External ear normal.  Left Ear: External ear normal.  Mouth/Throat: Oropharynx is clear and moist.  Eyes: Conjunctivae and EOM are normal. Pupils are equal, round, and reactive to light.  Neck: Normal range of motion. Neck supple. No thyromegaly present.  Left-sided bruit  Cardiovascular: Normal rate, regular rhythm, normal heart sounds and intact distal pulses.   Pulmonary/Chest: Effort normal and breath sounds normal.  Abdominal: Soft. Bowel sounds are  normal. She exhibits no mass. There is no tenderness.  Musculoskeletal: Normal range of motion.  Lymphadenopathy:    She has no cervical adenopathy.  Neurological: She is alert and oriented to person, place, and time.  Skin: Skin is warm and dry. No rash noted.  Psychiatric: She has a normal mood and affect. Her behavior is normal.          Assessment & Plan:   Hypertension well controlled Dyslipidemia.  Continue statin therapy Hypothyroidism. Left carotid bruit.  Will screen with a carotid artery.  Doppler study  CPX 6 months

## 2013-12-08 NOTE — Patient Instructions (Signed)
Limit your sodium (Salt) intake    It is important that you exercise regularly, at least 20 minutes 3 to 4 times per week.  If you develop chest pain or shortness of breath seek  medical attention.  Return in 6 months for follow-up  

## 2013-12-08 NOTE — Telephone Encounter (Signed)
Relevant patient education mailed to patient.  

## 2013-12-15 ENCOUNTER — Telehealth: Payer: Self-pay | Admitting: Internal Medicine

## 2013-12-15 ENCOUNTER — Ambulatory Visit (HOSPITAL_COMMUNITY): Payer: Medicare PPO | Attending: Cardiology | Admitting: *Deleted

## 2013-12-15 DIAGNOSIS — E785 Hyperlipidemia, unspecified: Secondary | ICD-10-CM | POA: Insufficient documentation

## 2013-12-15 DIAGNOSIS — I658 Occlusion and stenosis of other precerebral arteries: Secondary | ICD-10-CM | POA: Insufficient documentation

## 2013-12-15 DIAGNOSIS — R0989 Other specified symptoms and signs involving the circulatory and respiratory systems: Secondary | ICD-10-CM

## 2013-12-15 DIAGNOSIS — I10 Essential (primary) hypertension: Secondary | ICD-10-CM | POA: Insufficient documentation

## 2013-12-15 DIAGNOSIS — I6529 Occlusion and stenosis of unspecified carotid artery: Secondary | ICD-10-CM | POA: Insufficient documentation

## 2013-12-15 DIAGNOSIS — Z87891 Personal history of nicotine dependence: Secondary | ICD-10-CM | POA: Insufficient documentation

## 2013-12-15 NOTE — Telephone Encounter (Signed)
Please call/notify patient that lab/test/procedure is normal.  Cholesterol 218

## 2013-12-15 NOTE — Telephone Encounter (Signed)
Pt calling for lab results from 6/3, please advise.

## 2013-12-15 NOTE — Telephone Encounter (Signed)
Patient is aware of lab results.

## 2013-12-15 NOTE — Telephone Encounter (Signed)
Pt is wanting to know if the results are in from her labs that were done last week.  Pt states she received a letter yesterday and does not understand what the letter is about, pt states it mentions something about hypertension.

## 2013-12-15 NOTE — Progress Notes (Signed)
Carotid duplex complete 

## 2014-01-12 ENCOUNTER — Other Ambulatory Visit: Payer: Self-pay

## 2014-01-12 DIAGNOSIS — Z1231 Encounter for screening mammogram for malignant neoplasm of breast: Secondary | ICD-10-CM

## 2014-01-20 ENCOUNTER — Other Ambulatory Visit: Payer: Self-pay | Admitting: Internal Medicine

## 2014-02-08 ENCOUNTER — Ambulatory Visit
Admission: RE | Admit: 2014-02-08 | Discharge: 2014-02-08 | Disposition: A | Discharge status: Home / Self Care | Payer: Medicare PPO | Source: Ambulatory Visit

## 2014-02-08 DIAGNOSIS — Z1231 Encounter for screening mammogram for malignant neoplasm of breast: Secondary | ICD-10-CM

## 2014-02-21 ENCOUNTER — Encounter (HOSPITAL_COMMUNITY): Payer: Self-pay | Admitting: Emergency Medicine

## 2014-02-21 ENCOUNTER — Telehealth: Payer: Self-pay | Admitting: Internal Medicine

## 2014-02-21 ENCOUNTER — Emergency Department (HOSPITAL_COMMUNITY): Payer: Medicare PPO

## 2014-02-21 ENCOUNTER — Ambulatory Visit: Payer: Self-pay | Admitting: Physician Assistant

## 2014-02-21 ENCOUNTER — Emergency Department (HOSPITAL_COMMUNITY)
Admission: EM | Admit: 2014-02-21 | Discharge: 2014-02-21 | Disposition: A | Discharge status: Home / Self Care | Payer: Medicare PPO | Attending: Emergency Medicine | Admitting: Emergency Medicine

## 2014-02-21 DIAGNOSIS — Z7982 Long term (current) use of aspirin: Secondary | ICD-10-CM | POA: Insufficient documentation

## 2014-02-21 DIAGNOSIS — G459 Transient cerebral ischemic attack, unspecified: Secondary | ICD-10-CM | POA: Insufficient documentation

## 2014-02-21 DIAGNOSIS — I10 Essential (primary) hypertension: Secondary | ICD-10-CM | POA: Insufficient documentation

## 2014-02-21 DIAGNOSIS — R197 Diarrhea, unspecified: Secondary | ICD-10-CM | POA: Diagnosis not present

## 2014-02-21 DIAGNOSIS — Z9889 Other specified postprocedural states: Secondary | ICD-10-CM | POA: Insufficient documentation

## 2014-02-21 DIAGNOSIS — Z860100 Personal history of colon polyps, unspecified: Secondary | ICD-10-CM | POA: Insufficient documentation

## 2014-02-21 DIAGNOSIS — E785 Hyperlipidemia, unspecified: Secondary | ICD-10-CM | POA: Diagnosis not present

## 2014-02-21 DIAGNOSIS — E039 Hypothyroidism, unspecified: Secondary | ICD-10-CM | POA: Diagnosis not present

## 2014-02-21 DIAGNOSIS — R209 Unspecified disturbances of skin sensation: Secondary | ICD-10-CM | POA: Diagnosis present

## 2014-02-21 DIAGNOSIS — Z8719 Personal history of other diseases of the digestive system: Secondary | ICD-10-CM | POA: Insufficient documentation

## 2014-02-21 DIAGNOSIS — M1990 Unspecified osteoarthritis, unspecified site: Secondary | ICD-10-CM | POA: Insufficient documentation

## 2014-02-21 DIAGNOSIS — Z87891 Personal history of nicotine dependence: Secondary | ICD-10-CM | POA: Insufficient documentation

## 2014-02-21 DIAGNOSIS — Z79899 Other long term (current) drug therapy: Secondary | ICD-10-CM | POA: Insufficient documentation

## 2014-02-21 LAB — CBC WITH DIFFERENTIAL/PLATELET
BASOS PCT: 1 % (ref 0–1)
Basophils Absolute: 0 10*3/uL (ref 0.0–0.1)
EOS ABS: 0.1 10*3/uL (ref 0.0–0.7)
EOS PCT: 1 % (ref 0–5)
HCT: 41.3 % (ref 36.0–46.0)
Hemoglobin: 14.1 g/dL (ref 12.0–15.0)
LYMPHS ABS: 1 10*3/uL (ref 0.7–4.0)
Lymphocytes Relative: 15 % (ref 12–46)
MCH: 30.4 pg (ref 26.0–34.0)
MCHC: 34.1 g/dL (ref 30.0–36.0)
MCV: 89 fL (ref 78.0–100.0)
MONOS PCT: 9 % (ref 3–12)
Monocytes Absolute: 0.6 10*3/uL (ref 0.1–1.0)
NEUTROS PCT: 74 % (ref 43–77)
Neutro Abs: 4.6 10*3/uL (ref 1.7–7.7)
PLATELETS: 220 10*3/uL (ref 150–400)
RBC: 4.64 MIL/uL (ref 3.87–5.11)
RDW: 12.9 % (ref 11.5–15.5)
WBC: 6.2 10*3/uL (ref 4.0–10.5)

## 2014-02-21 LAB — BASIC METABOLIC PANEL
Anion gap: 13 (ref 5–15)
BUN: 16 mg/dL (ref 6–23)
CALCIUM: 9.4 mg/dL (ref 8.4–10.5)
CO2: 31 mEq/L (ref 19–32)
Chloride: 93 mEq/L — ABNORMAL LOW (ref 96–112)
Creatinine, Ser: 1.11 mg/dL — ABNORMAL HIGH (ref 0.50–1.10)
GFR, EST AFRICAN AMERICAN: 51 mL/min — AB (ref >90)
GFR, EST NON AFRICAN AMERICAN: 44 mL/min — AB (ref >90)
GLUCOSE: 140 mg/dL — AB (ref 70–99)
POTASSIUM: 3.3 meq/L — AB (ref 3.7–5.3)
SODIUM: 137 meq/L (ref 137–147)

## 2014-02-21 NOTE — ED Notes (Signed)
Pt states that around 0730 her left arm went numb and it "flopped down" pt also states she had a little diarrhea in underwear. Pt arm is better now per pt/

## 2014-02-21 NOTE — Telephone Encounter (Signed)
FYI

## 2014-02-21 NOTE — ED Notes (Addendum)
Pt c/o of numbness in her left arm at 7:30am for about 10 minutes.  Pt states arm still feels a little different, but can't quite verbalize how.  Pt also states she had a small incontinent, loose BM during the numbness episode. Pt states that she normally has loose stools a couple times a week.  No acute distress, respirations equal and unlabored, skin warm and dry.

## 2014-02-21 NOTE — Discharge Instructions (Signed)
Your episode today MAY have been a TIA. Take 4 81 mg aspirin daily. Call Your Dr. For a follow up appointment. Return to ER with any recurrence of symptoms.  Transient Ischemic Attack A transient ischemic attack (TIA) is a "warning stroke" that causes stroke-like symptoms. Unlike a stroke, a TIA does not cause permanent damage to the brain. The symptoms of a TIA can happen very fast and do not last long. It is important to know the symptoms of a TIA and what to do. This can help prevent a major stroke or death. CAUSES   A TIA is caused by a temporary blockage in an artery in the brain or neck (carotid artery). The blockage does not allow the brain to get the blood supply it needs and can cause different symptoms. The blockage can be caused by either:  A blood clot.  Fatty buildup (plaque) in a neck or brain artery. RISK FACTORS  High blood pressure (hypertension).  High cholesterol.  Diabetes mellitus.  Heart disease.  The build up of plaque in the blood vessels (peripheral artery disease or atherosclerosis).  The build up of plaque in the blood vessels providing blood and oxygen to the brain (carotid artery stenosis).  An abnormal heart rhythm (atrial fibrillation).  Obesity.  Smoking.  Taking oral contraceptives (especially in combination with smoking).  Physical inactivity.  A diet high in fats, salt (sodium), and calories.  Alcohol use.  Use of illegal drugs (especially cocaine and methamphetamine).  Being female.  Being African American.  Being over the age of 45.  Family history of stroke.  Previous history of blood clots, stroke, TIA, or heart attack.  Sickle cell disease. SYMPTOMS  TIA symptoms are the same as a stroke but are temporary. These symptoms usually develop suddenly, or may be newly present upon awakening from sleep:  Sudden weakness or numbness of the face, arm, or leg, especially on one side of the body.  Sudden trouble walking or  difficulty moving arms or legs.  Sudden confusion.  Sudden personality changes.  Trouble speaking (aphasia) or understanding.  Difficulty swallowing.  Sudden trouble seeing in one or both eyes.  Double vision.  Dizziness.  Loss of balance or coordination.  Sudden severe headache with no known cause.  Trouble reading or writing.  Loss of bowel or bladder control.  Loss of consciousness. DIAGNOSIS  Your caregiver may be able to determine the presence or absence of a TIA based on your symptoms, history, and physical exam. Computed tomography (CT scan) of the brain is usually performed to help identify a TIA. Other tests may be done to diagnose a TIA. These tests may include:  Electrocardiography.  Continuous heart monitoring.  Echocardiography.  Carotid ultrasonography.  Magnetic resonance imaging (MRI).  A scan of the brain circulation.  Blood tests. PREVENTION  The risk of a TIA can be decreased by appropriately treating high blood pressure, high cholesterol, diabetes, heart disease, and obesity and by quitting smoking, limiting alcohol, and staying physically active. TREATMENT  Time is of the essence. Since the symptoms of TIA are the same as a stroke, it is important to seek treatment as soon as possible because you may need a medicine to dissolve the clot (thrombolytic) that cannot be given if too much time has passed. Treatment options vary. Treatment options may include rest, oxygen, intravenous (IV) fluids, and medicines to thin the blood (anticoagulants). Medicines and diet may be used to address diabetes, high blood pressure, and other risk factors. Measures  will be taken to prevent short-term and long-term complications, including infection from breathing foreign material into the lungs (aspiration pneumonia), blood clots in the legs, and falls. Treatment options include procedures to either remove plaque in the carotid arteries or dilate carotid arteries that have  narrowed due to plaque. Those procedures are:  Carotid endarterectomy.  Carotid angioplasty and stenting. HOME CARE INSTRUCTIONS   Take all medicines prescribed by your caregiver. Follow the directions carefully. Medicines may be used to control risk factors for a stroke. Be sure you understand all your medicine instructions.  You may be told to take aspirin or the anticoagulant warfarin. Warfarin needs to be taken exactly as instructed.  Taking too much or too little warfarin is dangerous. Too much warfarin increases the risk of bleeding. Too little warfarin continues to allow the risk for blood clots. While taking warfarin, you will need to have regular blood tests to measure your blood clotting time. A PT blood test measures how long it takes for blood to clot. Your PT is used to calculate another value called an INR. Your PT and INR help your caregiver to adjust your dose of warfarin. The dose can change for many reasons. It is critically important that you take warfarin exactly as prescribed.  Many foods, especially foods high in vitamin K can interfere with warfarin and affect the PT and INR. Foods high in vitamin K include spinach, kale, broccoli, cabbage, collard and turnip greens, brussels sprouts, peas, cauliflower, seaweed, and parsley as well as beef and pork liver, green tea, and soybean oil. You should eat a consistent amount of foods high in vitamin K. Avoid major changes in your diet, or notify your caregiver before changing your diet. Arrange a visit with a dietitian to answer your questions.  Many medicines can interfere with warfarin and affect the PT and INR. You must tell your caregiver about any and all medicines you take, this includes all vitamins and supplements. Be especially cautious with aspirin and anti-inflammatory medicines. Do not take or discontinue any prescribed or over-the-counter medicine except on the advice of your caregiver or pharmacist.  Warfarin can have  side effects, such as excessive bruising or bleeding. You will need to hold pressure over cuts for longer than usual. Your caregiver or pharmacist will discuss other potential side effects.  Avoid sports or activities that may cause injury or bleeding.  Be mindful when shaving, flossing your teeth, or handling sharp objects.  Alcohol can change the body's ability to handle warfarin. It is best to avoid alcoholic drinks or consume only very small amounts while taking warfarin. Notify your caregiver if you change your alcohol intake.  Notify your dentist or other caregivers before procedures.  Eat a diet that includes 5 or more servings of fruits and vegetables each day. This may reduce the risk of stroke. Certain diets may be prescribed to address high blood pressure, high cholesterol, diabetes, or obesity.  A low-sodium, low-saturated fat, low-trans fat, low-cholesterol diet is recommended to manage high blood pressure.  A low-saturated fat, low-trans fat, low-cholesterol, and high-fiber diet may control cholesterol levels.  A controlled-carbohydrate, controlled-sugar diet is recommended to manage diabetes.  A reduced-calorie, low-sodium, low-saturated fat, low-trans fat, low-cholesterol diet is recommended to manage obesity.  Maintain a healthy weight.  Stay physically active. It is recommended that you get at least 30 minutes of activity on most or all days.  Do not smoke.  Limit alcohol use even if you are not taking warfarin. Moderate  alcohol use is considered to be:  No more than 2 drinks each day for men.  No more than 1 drink each day for nonpregnant women.  Stop drug abuse.  Home safety. A safe home environment is important to reduce the risk of falls. Your caregiver may arrange for specialists to evaluate your home. Having grab bars in the bedroom and bathroom is often important. Your caregiver may arrange for equipment to be used at home, such as raised toilets and a seat  for the shower.  Follow all instructions for follow-up with your caregiver. This is very important. This includes any referrals and lab tests. Proper follow up can prevent a stroke or another TIA from occurring. SEEK MEDICAL CARE IF:  You have personality changes.  You have difficulty swallowing.  You are seeing double.  You have dizziness.  You have a fever.  You have skin breakdown. SEEK IMMEDIATE MEDICAL CARE IF:  Any of these symptoms may represent a serious problem that is an emergency. Do not wait to see if the symptoms will go away. Get medical help right away. Call your local emergency services (911 in [place] Do not drive yourself to the hospital.  You have sudden weakness or numbness of the face, arm, or leg, especially on one side of the body.  You have sudden trouble walking or difficulty moving arms or legs.  You have sudden confusion.  You have trouble speaking (aphasia) or understanding.  You have sudden trouble seeing in one or both eyes.  You have a loss of balance or coordination.  You have a sudden, severe headache with no known cause.  You have new chest pain or an irregular heartbeat.  You have a partial or total loss of consciousness. MAKE SURE YOU:   Understand these instructions.  Will watch your condition.  Will get help right away if you are not doing well or get worse. Document Released: 04/03/2005 Document Revised: 06/29/2013 Document Reviewed: 09/29/2013 [facility] Patient Information 2015 [place], [place]. This information is not intended to replace advice given to you by your health care provider. Make sure you discuss any questions you have with your health care provider.

## 2014-02-21 NOTE — ED Notes (Signed)
Pt transported to MRI 

## 2014-02-21 NOTE — Telephone Encounter (Signed)
Patient Information:  Caller Name: Ereka  Phone: [phone]  Patient: [name], [name]  Gender: Female  DOB: Nov 23, 1924  Age: 80 Years  PCP: Bluford Kaufmann (Family Practice > 73yrs old)  Office Follow Up:  Does the office need to follow up with this patient?: No  Instructions For The Office: N/A  RN Note:  While resting the numb left arm, pt experienced fecal incontinence.  Due to two TIA symptoms, RN contacted Linda/office nurse who agreed pt should be evaluated now in ED.  Pt agreed to be seen at ED; plans to be evaluated at [facility]  Symptoms  Reason For Call & Symptoms: Left arm was "flopping; like it had gone to sleep" about 0800.  She had been out of bed for about 1 hour when this event occurred.  Was able to move arm but not feel it.  Numbness lasted about 10 mintues.  Reviewed Health History In EMR: Yes  Reviewed Medications In EMR: Yes  Reviewed Allergies In EMR: Yes  Reviewed Surgeries / Procedures: Yes  Date of Onset of Symptoms: 02/21/2014  Treatments Tried: rested arm in lap  Treatments Tried Worked: No  Guideline(s) Used:  Neurologic Deficit  Disposition Per Guideline:   Go to ED Now (or to Office with PCP Approval)  Reason For Disposition Reached:   Loss of control of bowel or bladder (i.e., incontinence) of new onset  Advice Given:  Call Back If:  Symptoms do not go away within 30 minutes  You become worse.  RN Overrode Recommendation:  Go To ED  Discussed ED recommendation with office nurse Vaughan Basta who agreed.

## 2014-03-01 NOTE — ED Provider Notes (Signed)
CSN: [id number]     Arrival date & time 02/21/14  7829 History   First MD Initiated Contact with Patient 02/21/14 1028     Chief Complaint  Patient presents with  . Numbness  . Diarrhea      HPI  Pt presents with difficulty using the LUE for 5-10 minutes this morning.  She awakened, walked to the kitchen, poured coffee, then sat at her table.  States that she started to lift her LUE and it felt "Floppy".   As she demonstrates this, she indicates that she had trouble extending her wrist and it fell into flexion. She was sitting at table with coffee in RUE, and LUE leaning ontable (Positional Neuropraxia?, Radial?).  She states that she felt like she needed to have a BM, and has had loose stools "for a long time".  She was "afraid" to stand up, and leaked a little liquid stool, not unusual for her.  Sx resolved within a few minutes.  Now with normal use of LUE.   Past Medical History  Diagnosis Date  . [place] DISEASE, LUMBAR 12/16/2008  . DIVERTICULOSIS, COLON 09/30/2008  . DYSPNEA 07/13/2008  . HYPERLIPIDEMIA 03/06/2007  . HYPERTENSION 03/06/2007  . HYPOTHYROIDISM 10/13/2007  . OSTEOARTHRITIS 03/06/2007  . Personal history of colonic polyps 09/30/2008  . TOBACCO USE, QUIT 04/12/2009  . WEAKNESS 11/09/2007  . Graves disease    Past Surgical History  Procedure Laterality Date  . Cataract extraction    . Knee surgery    . Cholecystectomy    . Cardiac catheterization     Family History  Problem Relation Age of Onset  . Cancer Maternal Grandmother     stomach   History  Substance Use Topics  . Smoking status: Former Smoker    Quit date: 07/08/1978  . Smokeless tobacco: Never Used  . Alcohol Use: Yes   OB History   Grav Para Term Preterm Abortions TAB SAB Ect Mult Living                 Review of Systems  Constitutional: Negative for fever, chills, diaphoresis, appetite change and fatigue.  HENT: Negative for mouth sores, sore throat and trouble swallowing.   Eyes: Negative for  visual disturbance.  Respiratory: Negative for cough, chest tightness, shortness of breath and wheezing.   Cardiovascular: Negative for chest pain.  Gastrointestinal: Positive for diarrhea. Negative for nausea, vomiting, abdominal pain and abdominal distention.  Endocrine: Negative for polydipsia, polyphagia and polyuria.  Genitourinary: Negative for dysuria, frequency and hematuria.  Musculoskeletal: Negative for gait problem.  Skin: Negative for color change, pallor and rash.  Neurological: Positive for weakness. Negative for dizziness, syncope, light-headedness and headaches.  Hematological: Does not bruise/bleed easily.  Psychiatric/Behavioral: Negative for behavioral problems and confusion.      Allergies  Review of patient's allergies indicates no known allergies.  Home Medications   Prior to Admission medications   Medication Sig Start Date End Date Taking? Authorizing Provider  aspirin 81 MG tablet Take 81 mg by mouth daily.     Yes Historical Provider, MD  felodipine (PLENDIL) 5 MG 24 hr tablet Take 5 mg by mouth daily.   Yes Historical Provider, MD  hydrochlorothiazide (HYDRODIURIL) 25 MG tablet Take 25 mg by mouth daily.   Yes Historical Provider, MD  levothyroxine (SYNTHROID, LEVOTHROID) 75 MCG tablet Take 75 mcg by mouth daily before breakfast.   Yes Historical Provider, MD  pravastatin (PRAVACHOL) 20 MG tablet Take 20 mg by mouth daily.  Yes Historical Provider, MD   BP 170/70  Pulse 90  Temp(Src) 98 F (36.7 C) (Oral)  Resp 18  SpO2 95% Physical Exam  Constitutional: She is oriented to person, place, and time. She appears well-developed and well-nourished. No distress.  HENT:  Head: Normocephalic.  Eyes: Conjunctivae are normal. Pupils are equal, round, and reactive to light. No scleral icterus.  Neck: Normal range of motion. Neck supple. No thyromegaly present.  Cardiovascular: Normal rate and regular rhythm.  Exam reveals no gallop and no friction rub.   No  murmur heard. Pulmonary/Chest: Effort normal and breath sounds normal. No respiratory distress. She has no wheezes. She has no rales.  Abdominal: Soft. Bowel sounds are normal. She exhibits no distension. There is no tenderness. There is no rebound.  Musculoskeletal: Normal range of motion.  Neurological: She is alert and oriented to person, place, and time.  Normal symmetric Strength to shoulder shrug, triceps, biceps, grip,wrist flex/extend,and intrinsics  Norma lsymmetric sensation above and below clavicles, and to all distributions to UEs. Norma symmetric strength to flex/.extend hip and knees, dorsi/plantar flex ankles. Normal symmetric sensation to all distributions to LEs Patellar and achilles reflexes 1-2+. Downgoing Babinski   Skin: Skin is warm and dry. No rash noted.  Psychiatric: She has a normal mood and affect. Her behavior is normal.    ED Course  Procedures (including critical care time) Labs Review Labs Reviewed  BASIC METABOLIC PANEL - Abnormal; Notable for the following:    Potassium 3.3 (*)    Chloride 93 (*)    Glucose, Bld 140 (*)    Creatinine, Ser 1.11 (*)    GFR calc non Af Amer 44 (*)    GFR calc Af Amer 51 (*)    All other components within normal limits  CBC WITH DIFFERENTIAL    Imaging Review No results found.   EKG Interpretation None    Sinus rhythm.  No ectopy.  MDM   Final diagnoses:  Transient cerebral ischemia, unspecified transient cerebral ischemia type    Pt remains without synmptoms, and with normal exam, including normal use of LUE. Pt made aware of MRI findings.  States she will f/u with PCP.  No acute CVA findings.  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  Instructed to take ASA 81mg  daily.  RTER with any changes.    Tanna Furry, MD 03/01/14 [phone]

## 2014-03-18 ENCOUNTER — Other Ambulatory Visit: Payer: Self-pay | Admitting: Physician Assistant

## 2014-04-12 ENCOUNTER — Ambulatory Visit (INDEPENDENT_AMBULATORY_CARE_PROVIDER_SITE_OTHER): Payer: Medicare PPO | Admitting: *Deleted

## 2014-04-12 DIAGNOSIS — Z23 Encounter for immunization: Secondary | ICD-10-CM

## 2014-04-27 ENCOUNTER — Other Ambulatory Visit: Payer: Self-pay | Admitting: Internal Medicine

## 2014-05-18 ENCOUNTER — Telehealth: Payer: Self-pay | Admitting: Internal Medicine

## 2014-05-18 ENCOUNTER — Ambulatory Visit (INDEPENDENT_AMBULATORY_CARE_PROVIDER_SITE_OTHER): Payer: Medicare PPO | Admitting: Internal Medicine

## 2014-05-18 ENCOUNTER — Encounter: Payer: Self-pay | Admitting: Internal Medicine

## 2014-05-18 VITALS — BP 148/80 | HR 57 | Temp 97.8°F | Ht 65.5 in | Wt 154.0 lb

## 2014-05-18 DIAGNOSIS — I10 Essential (primary) hypertension: Secondary | ICD-10-CM

## 2014-05-18 DIAGNOSIS — I779 Disorder of arteries and arterioles, unspecified: Secondary | ICD-10-CM

## 2014-05-18 DIAGNOSIS — I739 Peripheral vascular disease, unspecified: Secondary | ICD-10-CM

## 2014-05-18 DIAGNOSIS — E039 Hypothyroidism, unspecified: Secondary | ICD-10-CM

## 2014-05-18 MED ORDER — LISINOPRIL-HYDROCHLOROTHIAZIDE 20-25 MG PO TABS: 1.0000 | ORAL_TABLET | Freq: Every day | ORAL | Status: DC

## 2014-05-18 NOTE — Telephone Encounter (Signed)
Patient Information:  Caller Name: Unice  Phone: [phone]  Patient: [name], [name]  Gender: Female  DOB: 1925/01/23  Age: 80 Years  PCP: Bluford Kaufmann (Family Practice > 41yrs old)  Office Follow Up:  Does the office need to follow up with this patient?: No  Instructions For The Office: N/A   Symptoms  Reason For Call & Symptoms: Patient reports 173/83 at orthopedic office (she was there with her daughter. She also reports BP elevation when she was having melanoma removed on 05/17/14.  See Today in Office per High Blood Pressure guideline dut ot Patient wants to be seen.  Reviewed Health History In EMR: Yes  Reviewed Medications In EMR: Yes  Reviewed Allergies In EMR: Yes  Reviewed Surgeries / Procedures: Yes  Date of Onset of Symptoms: 05/17/2014  Treatments Tried: Usual medications  Treatments Tried Worked: No  Guideline(s) Used:  High Blood Pressure  Disposition Per Guideline:   See Today in Office  Reason For Disposition Reached:   Patient wants to be seen  Advice Given:  N/A  Patient Will Follow Care Advice:  YES  Appointment Scheduled:  05/18/2014 15:15:00 Appointment Scheduled Provider:  Bluford Kaufmann (Family Practice > 41yrs old)

## 2014-05-18 NOTE — Telephone Encounter (Signed)
FYI, appointment at 3:00 pm today.

## 2014-05-18 NOTE — Progress Notes (Signed)
Subjective:    Patient ID: [name], female    DOB: 02/27/1925, 80 y.o.   MRN: [id number]  HPI 80 year old patient who is seen today with blood pressure concerns.  She has had surgery for her fourth melanoma recently and blood pressure was noted be elevated at the time of outpatient surgery.  She accompanied family member to an orthopedic evaluation earlier today and blood pressure was also elevated. She was seen in the ED and August of this year with left arm weakness.  Evaluation included a brain MRI that was normal In June, she had a carotid artery Doppler study performed that revealed a 60-79% left carotid blockage and a less than 50% right carotid blockage.  She is scheduled for follow-up in January  Past Medical History  Diagnosis Date  . [place] DISEASE, LUMBAR 12/16/2008  . DIVERTICULOSIS, COLON 09/30/2008  . DYSPNEA 07/13/2008  . HYPERLIPIDEMIA 03/06/2007  . HYPERTENSION 03/06/2007  . HYPOTHYROIDISM 10/13/2007  . OSTEOARTHRITIS 03/06/2007  . Personal history of colonic polyps 09/30/2008  . TOBACCO USE, QUIT 04/12/2009  . WEAKNESS 11/09/2007  . Graves disease     History   Social History  . Marital Status: Divorced    Spouse Name: N/A    Number of Children: N/A  . Years of Education: N/A   Occupational History  . Not on file.   Social History Main Topics  . Smoking status: Former Smoker    Quit date: 07/08/1978  . Smokeless tobacco: Never Used  . Alcohol Use: Yes  . Drug Use: No  . Sexual Activity: Not on file   Other Topics Concern  . Not on file   Social History Narrative    Past Surgical History  Procedure Laterality Date  . Cataract extraction    . Knee surgery    . Cholecystectomy    . Cardiac catheterization      Family History  Problem Relation Age of Onset  . Cancer Maternal Grandmother     stomach    No Known Allergies  Current Outpatient Prescriptions on File Prior to Visit  Medication Sig Dispense Refill  . aspirin 81 MG tablet Take 81 mg  by mouth daily.      . felodipine (PLENDIL) 5 MG 24 hr tablet Take 5 mg by mouth daily.    [name] Kitchen levothyroxine (SYNTHROID, LEVOTHROID) 75 MCG tablet TAKE 1 TABLET BY MOUTH EVERY DAY 90 tablet 1  . pravastatin (PRAVACHOL) 20 MG tablet Take 20 mg by mouth daily.     No current facility-administered medications on file prior to visit.    BP 148/80 mmHg  Pulse 57  Temp(Src) 97.8 F (36.6 C) (Oral)  Ht 5' 5.5" (1.664 m)  Wt 154 lb (69.854 kg)  BMI 25.23 kg/m2      Review of Systems  Constitutional: Negative.   HENT: Negative for congestion, dental problem, hearing loss, rhinorrhea, sinus pressure, sore throat and tinnitus.   Eyes: Negative for pain, discharge and visual disturbance.  Respiratory: Negative for cough and shortness of breath.   Cardiovascular: Negative for chest pain, palpitations and leg swelling.  Gastrointestinal: Negative for nausea, vomiting, abdominal pain, diarrhea, constipation, blood in stool and abdominal distention.  Genitourinary: Negative for dysuria, urgency, frequency, hematuria, flank pain, vaginal bleeding, vaginal discharge, difficulty urinating, vaginal pain and pelvic pain.  Musculoskeletal: Negative for joint swelling, arthralgias and gait problem.  Skin: Negative for rash.  Neurological: Negative for dizziness, syncope, speech difficulty, weakness, numbness and headaches.  Hematological: Negative for adenopathy.  Psychiatric/Behavioral: Negative for behavioral problems, dysphoric mood and agitation. The patient is not nervous/anxious.        Objective:   Physical Exam  Constitutional: She is oriented to person, place, and time. She appears well-developed and well-nourished.  Blood pressure 170/80 in both arms  HENT:  Head: Normocephalic.  Right Ear: External ear normal.  Left Ear: External ear normal.  Mouth/Throat: Oropharynx is clear and moist.  Eyes: Conjunctivae and EOM are normal. Pupils are equal, round, and reactive to light.  Neck:  Normal range of motion. Neck supple. No thyromegaly present.  Cardiovascular: Normal rate, regular rhythm, normal heart sounds and intact distal pulses.   Pulmonary/Chest: Effort normal and breath sounds normal.  Abdominal: Soft. Bowel sounds are normal. She exhibits no mass. There is no tenderness.  Musculoskeletal: Normal range of motion.  Lymphadenopathy:    She has no cervical adenopathy.  Neurological: She is alert and oriented to person, place, and time.  Skin: Skin is warm and dry. No rash noted.  Psychiatric: She has a normal mood and affect. Her behavior is normal.          Assessment & Plan:   Hypertension, suboptimal control.  Will add lisinopril 20.  Home blood pressure monitoring.  Encouraged low salt diet.  Encouraged.  Will recheck in 6 weeks Bilateral carotid artery disease.  Follow-up carotid artery study scheduled in January History of probable TIA with left arm weakness.  Continue aspirin  Recheck 6 weeks

## 2014-05-18 NOTE — Progress Notes (Signed)
Pre visit review using our clinic review tool, if applicable. No additional management support is needed unless otherwise documented below in the visit note. 

## 2014-05-18 NOTE — Patient Instructions (Signed)
Limit your sodium (Salt) intake  Please check your blood pressure on a regular basis.  If it is consistently greater than 150/90, please make an office appointment.   

## 2014-05-23 ENCOUNTER — Telehealth: Payer: Self-pay | Admitting: Internal Medicine

## 2014-05-23 NOTE — Telephone Encounter (Signed)
Spoke with patient and she "is feeling better this evening".  Appointment scheduled with Dr Raliegh Ip on Wednesday. FYI

## 2014-05-23 NOTE — Telephone Encounter (Signed)
Patient Information:  Caller Name: Angeliz  Phone: [phone]  Patient: [name], [name]  Gender: Female  DOB: 23-Apr-1925  Age: 80 Years  PCP: Bluford Kaufmann (Family Practice > 57yrs old)  Office Follow Up:  Does the office need to follow up with this patient?: Yes  Instructions For The Office: Please f/u with pt concerning her blood pressure medications and side effect, thank you.   Symptoms  Reason For Call & Symptoms: Pt reports she started 05/19/14 new BP medication Lisinopril/HCTZ 20-25mg .  Pt reports having shakinees, nausea, fatigue, no appetite, bilateral icey cold hands. Pt reports feeling  Reviewed Health History In EMR: Yes  Reviewed Medications In EMR: Yes  Reviewed Allergies In EMR: Yes  Reviewed Surgeries / Procedures: Yes  Date of Onset of Symptoms: 05/18/2014  Guideline(s) Used:  No Protocol Available - Information Only  No Protocol Available - Sick Adult  Disposition Per Guideline:   Discuss with PCP and Callback by Nurse Today  Reason For Disposition Reached:   Nursing judgment  Advice Given:  Call Back If:  New symptoms develop  You become worse.  Patient Will Follow Care Advice:  YES

## 2014-05-25 ENCOUNTER — Ambulatory Visit (INDEPENDENT_AMBULATORY_CARE_PROVIDER_SITE_OTHER): Payer: Medicare PPO | Admitting: Internal Medicine

## 2014-05-25 ENCOUNTER — Encounter: Payer: Self-pay | Admitting: Internal Medicine

## 2014-05-25 VITALS — BP 152/70 | HR 76 | Temp 98.0°F | Resp 20 | Ht 65.5 in | Wt 156.0 lb

## 2014-05-25 DIAGNOSIS — I10 Essential (primary) hypertension: Secondary | ICD-10-CM

## 2014-05-25 NOTE — Progress Notes (Signed)
Subjective:    Patient ID: [name], female    DOB: May 15, 1925, 80 y.o.   MRN: [id number]  HPI  BP Readings from Last 3 Encounters:  05/25/14 152/70  05/18/14 148/80  02/21/14 26/48   44 -year-old patientwho has treated hypertension.  She was seen by dermatology yesterday with a blood pressure of 147/76.  She was concerned about poorly controlled hypertension.  The past week she has had malaise, poor appetite and mild cough.  This has improved over the past 2 days.  There is been no fever.  Past Medical History  Diagnosis Date  . [place] DISEASE, LUMBAR 12/16/2008  . DIVERTICULOSIS, COLON 09/30/2008  . DYSPNEA 07/13/2008  . HYPERLIPIDEMIA 03/06/2007  . HYPERTENSION 03/06/2007  . HYPOTHYROIDISM 10/13/2007  . OSTEOARTHRITIS 03/06/2007  . Personal history of colonic polyps 09/30/2008  . TOBACCO USE, QUIT 04/12/2009  . WEAKNESS 11/09/2007  . Graves disease     History   Social History  . Marital Status: Divorced    Spouse Name: N/A    Number of Children: N/A  . Years of Education: N/A   Occupational History  . Not on file.   Social History Main Topics  . Smoking status: Former Smoker    Quit date: 07/08/1978  . Smokeless tobacco: Never Used  . Alcohol Use: Yes  . Drug Use: No  . Sexual Activity: Not on file   Other Topics Concern  . Not on file   Social History Narrative    Past Surgical History  Procedure Laterality Date  . Cataract extraction    . Knee surgery    . Cholecystectomy    . Cardiac catheterization      Family History  Problem Relation Age of Onset  . Cancer Maternal Grandmother     stomach    No Known Allergies  Current Outpatient Prescriptions on File Prior to Visit  Medication Sig Dispense Refill  . aspirin 81 MG tablet Take 81 mg by mouth daily.      . felodipine (PLENDIL) 5 MG 24 hr tablet Take 5 mg by mouth daily.    [name] Kitchen levothyroxine (SYNTHROID, LEVOTHROID) 75 MCG tablet TAKE 1 TABLET BY MOUTH EVERY DAY 90 tablet 1  .  lisinopril-hydrochlorothiazide (PRINZIDE,ZESTORETIC) 20-25 MG per tablet Take 1 tablet by mouth daily. 90 tablet 3  . pravastatin (PRAVACHOL) 20 MG tablet Take 20 mg by mouth daily.     No current facility-administered medications on file prior to visit.    BP 152/70 mmHg  Pulse 76  Temp(Src) 98 F (36.7 C) (Oral)  Resp 20  Ht 5' 5.5" (1.664 m)  Wt 156 lb (70.761 kg)  BMI 25.56 kg/m2  SpO2 97%     Review of Systems  Constitutional: Positive for activity change, appetite change and fatigue.  HENT: Negative for congestion, dental problem, hearing loss, rhinorrhea, sinus pressure, sore throat and tinnitus.   Eyes: Negative for pain, discharge and visual disturbance.  Respiratory: Negative for cough and shortness of breath.   Cardiovascular: Negative for chest pain, palpitations and leg swelling.  Gastrointestinal: Negative for nausea, vomiting, abdominal pain, diarrhea, constipation, blood in stool and abdominal distention.  Genitourinary: Negative for dysuria, urgency, frequency, hematuria, flank pain, vaginal bleeding, vaginal discharge, difficulty urinating, vaginal pain and pelvic pain.  Musculoskeletal: Negative for joint swelling, arthralgias and gait problem.  Skin: Negative for rash.  Neurological: Negative for dizziness, syncope, speech difficulty, weakness, numbness and headaches.  Hematological: Negative for adenopathy.  Psychiatric/Behavioral: Negative for behavioral problems,  dysphoric mood and agitation. The patient is not nervous/anxious.        Objective:   Physical Exam  Constitutional: She appears well-developed and well-nourished. No distress.  Blood pressure 148/68 Blood pressure 152/70 on arrival          Assessment & Plan:   Hypertension.  Satisfactory control.  Patient was placed on additional medications one week ago, which may not have had time to reach maximum effect.  We'll continue to observe on present regimen Recheck in 6 weeks as planned

## 2014-05-25 NOTE — Progress Notes (Signed)
Pre visit review using our clinic review tool, if applicable. No additional management support is needed unless otherwise documented below in the visit note. 

## 2014-06-15 ENCOUNTER — Encounter: Payer: Medicare PPO | Admitting: Internal Medicine

## 2014-06-16 ENCOUNTER — Telehealth: Payer: Self-pay | Admitting: Internal Medicine

## 2014-06-16 ENCOUNTER — Emergency Department (HOSPITAL_COMMUNITY)
Admission: EM | Admit: 2014-06-16 | Discharge: 2014-06-16 | Disposition: A | Discharge status: Home / Self Care | Payer: Medicare PPO | Attending: Emergency Medicine | Admitting: Emergency Medicine

## 2014-06-16 ENCOUNTER — Encounter (HOSPITAL_COMMUNITY): Payer: Self-pay | Admitting: Emergency Medicine

## 2014-06-16 DIAGNOSIS — Z86010 Personal history of colonic polyps: Secondary | ICD-10-CM | POA: Insufficient documentation

## 2014-06-16 DIAGNOSIS — E039 Hypothyroidism, unspecified: Secondary | ICD-10-CM | POA: Diagnosis not present

## 2014-06-16 DIAGNOSIS — E0500 Thyrotoxicosis with diffuse goiter without thyrotoxic crisis or storm: Secondary | ICD-10-CM | POA: Diagnosis not present

## 2014-06-16 DIAGNOSIS — E785 Hyperlipidemia, unspecified: Secondary | ICD-10-CM | POA: Insufficient documentation

## 2014-06-16 DIAGNOSIS — Z8719 Personal history of other diseases of the digestive system: Secondary | ICD-10-CM | POA: Diagnosis not present

## 2014-06-16 DIAGNOSIS — T783XXA Angioneurotic edema, initial encounter: Secondary | ICD-10-CM | POA: Diagnosis not present

## 2014-06-16 DIAGNOSIS — M1990 Unspecified osteoarthritis, unspecified site: Secondary | ICD-10-CM | POA: Insufficient documentation

## 2014-06-16 DIAGNOSIS — Z79899 Other long term (current) drug therapy: Secondary | ICD-10-CM | POA: Diagnosis not present

## 2014-06-16 DIAGNOSIS — Z87891 Personal history of nicotine dependence: Secondary | ICD-10-CM | POA: Diagnosis not present

## 2014-06-16 DIAGNOSIS — T464X5A Adverse effect of angiotensin-converting-enzyme inhibitors, initial encounter: Secondary | ICD-10-CM | POA: Diagnosis not present

## 2014-06-16 DIAGNOSIS — R220 Localized swelling, mass and lump, head: Secondary | ICD-10-CM | POA: Diagnosis present

## 2014-06-16 DIAGNOSIS — I10 Essential (primary) hypertension: Secondary | ICD-10-CM | POA: Diagnosis not present

## 2014-06-16 DIAGNOSIS — Z7982 Long term (current) use of aspirin: Secondary | ICD-10-CM | POA: Insufficient documentation

## 2014-06-16 MED ORDER — METHYLPREDNISOLONE SODIUM SUCC 125 MG IJ SOLR
125.0000 mg | Freq: Once | INTRAMUSCULAR | Status: AC
  Administered 2014-06-16: 125 mg via INTRAVENOUS

## 2014-06-16 MED ORDER — METHYLPREDNISOLONE SODIUM SUCC 125 MG IJ SOLR
INTRAMUSCULAR | Status: AC
  Filled 2014-06-16: qty 2

## 2014-06-16 NOTE — Telephone Encounter (Signed)
Per Dr Sherren Mocha patient should start HCTZ 25 mg daily and follow up with Dr Raliegh Ip. Patient is aware.

## 2014-06-16 NOTE — ED Provider Notes (Signed)
Care from Dr. Florina Ou. Here with angioedema from ACE inhibitor. Cautioned on her telling all her providers about her new allergy to ACE inhibitors. Patient reports improving "lump in her throat" sensation, improved tongue swelling. No difficulty breathing. Stable for discharge.    1. ACE inhibitor-aggravated angioedema, initial encounter      Gloria Bucy, MD 06/16/14 0830

## 2014-06-16 NOTE — ED Notes (Signed)
Patient reports feeling better. Will speak to patient to arrange transportation home.

## 2014-06-16 NOTE — Telephone Encounter (Signed)
Patient states she went to ED from an allergic reaction to lisinopril-hydrochlorothiazide (PRINZIDE,ZESTORETIC) 20-25 MG per tablet, unable to breath and swollen tongue.  The hospital discontinued the medication.  Patient wants to know if it's okay to start back taking previous medication??  She wants me to send this message to Dr. Sherren Mocha since Dr. Raliegh Ip is out of the office.

## 2014-06-16 NOTE — Discharge Instructions (Signed)
Angioedema °Angioedema is a sudden swelling of tissues, often of the skin. It can occur on the face or genitals or in the abdomen or other body parts. The swelling usually develops over a short period and gets better in 24 to 48 hours. It often begins during the night and is found when the person wakes up. The person may also get red, itchy patches of skin (hives). Angioedema can be dangerous if it involves swelling of the air passages.  °Depending on the cause, episodes of angioedema may only happen once, come back in unpredictable patterns, or repeat for several years and then gradually fade away.  °CAUSES  °Angioedema can be caused by an allergic reaction to various triggers. It can also result from nonallergic causes, including reactions to drugs, immune system disorders, viral infections, or an abnormal gene that is passed to you from your parents (hereditary). For some people with angioedema, the cause is unknown.  °Some things that can trigger angioedema include:  °· Foods.   °· Medicines, such as ACE inhibitors, ARBs, nonsteroidal anti-inflammatory agents, or estrogen.   °· Latex.   °· Animal saliva.   °· Insect stings.   °· Dyes used in X-rays.   °· Mild injury.   °· Dental work. °· Surgery. °· Stress.   °· Sudden changes in temperature.   °· Exercise. °SIGNS AND SYMPTOMS  °· Swelling of the skin. °· Hives. If these are present, there is also intense itching. °· Redness in the affected area.   °· Pain in the affected area. °· Swollen lips or tongue. °· Breathing problems. This may happen if the air passages swell. °· Wheezing. °If internal organs are involved, there may be:  °· Nausea.   °· Abdominal pain.   °· Vomiting.   °· Difficulty swallowing.   °· Difficulty passing urine. °DIAGNOSIS  °· Your health care provider will examine the affected area and take a medical and family history. °· Various tests may be done to help determine the cause. Tests may include: °¨ Allergy skin tests to see if the problem  is an allergic reaction.   °¨ Blood tests to check for hereditary angioedema.   °¨ Tests to check for underlying diseases that could cause the condition.   °· A review of your medicines, including over-the-counter medicines, may be done. °TREATMENT  °Treatment will depend on the cause of the angioedema. Possible treatments include:  °· Removal of anything that triggered the condition (such as stopping certain medicines).   °· Medicines to treat symptoms or prevent attacks. Medicines given may include:   °¨ Antihistamines.   °¨ Epinephrine injection.   °¨ Steroids.   °· Hospitalization may be required for severe attacks. If the air passages are affected, it can be an emergency. Tubes may need to be placed to keep the airway open. °HOME CARE INSTRUCTIONS  °· Take all medicines as directed by your health care provider. °· If you were given medicines for emergency allergy treatment, always carry them with you. °· Wear a medical bracelet as directed by your health care provider.   °· Avoid known triggers. °SEEK MEDICAL CARE IF:  °· You have repeat attacks of angioedema.   °· Your attacks are more frequent or more severe despite preventive measures.   °· You have hereditary angioedema and are considering having children. It is important to discuss with your health care provider the risks of passing the condition on to your children. °SEEK IMMEDIATE MEDICAL CARE IF:  °· You have severe swelling of the mouth, tongue, or lips. °· You have difficulty breathing.   °· You have difficulty swallowing.   °· You faint. °MAKE   SURE YOU: °· Understand these instructions. °· Will watch your condition. °· Will get help right away if you are not doing well or get worse. °Document Released: 09/02/2001 Document Revised: 11/08/2013 Document Reviewed: 02/15/2013 °[employer]® Patient Information ©2015 [employer]. This information is not intended to replace advice given to you by your health care provider. Make sure you discuss any questions  you have with your health care provider. ° °

## 2014-06-16 NOTE — ED Notes (Signed)
Patient states she started taking Lisinopril/HCTZ 20mg  @ 3 weeks ago. Patient states at 0150 she woke up suddenly feeling like her throat was dry, it progressed to swelling to the left side of her tongue and and left neck. Patient states she attempted salt water gargles initially prior to the swelling being noticed. Patient states she began having a hard time talking and that was when she became concerned. Patient able to speak in complete sentences without difficulty. Patient airway intact.

## 2014-06-16 NOTE — ED Notes (Signed)
Patient states she feels some better, tongue less swollen in appearance. Patient states she still feels like her neck is swollen, neck looks similair in appearance as on arrival to ER.

## 2014-06-16 NOTE — ED Notes (Signed)
Per EMS, patient from home, patient c/o swelling to left sided of her neck and tongue. Patient was given 50mg  Benadryl PO and 50mg  IV Zantac en route. Patient placed on [facility] for comfort.

## 2014-06-16 NOTE — ED Notes (Signed)
Bed: KZ99 Expected date:  Expected time:  Means of arrival:  Comments: EMS 80yo swelling to throat and tongue, difficulty swallowing

## 2014-06-16 NOTE — ED Notes (Signed)
Patient calling her daughter for a ride home.

## 2014-06-16 NOTE — ED Provider Notes (Signed)
CSN: [id number]     Arrival date & time 06/16/14  0301 History   First MD Initiated Contact with Patient 06/16/14 [phone]     Chief Complaint  Patient presents with  . Oral Swelling    left sided neck and tongue swelling     (Consider location/radiation/quality/duration/timing/severity/associated sxs/prior Treatment) HPI  This is an 80 year old female who was started on lisinopril/hydrochlorothiazide about 3 weeks ago. She awoke this morning about 1:50 AM with swelling of the left side of her tongue in the left side of her pharynx. This is making swallowing and speaking difficult. She called EMS who administered 50 milligrams of Benadryl by mouth and 50 milligrams of Zantac IV prior to arrival. She has had no change in symptoms as a result of these medications. Symptoms are moderate. She she is having no difficulty breathing.  Past Medical History  Diagnosis Date  . [place] DISEASE, LUMBAR 12/16/2008  . DIVERTICULOSIS, COLON 09/30/2008  . DYSPNEA 07/13/2008  . HYPERLIPIDEMIA 03/06/2007  . HYPERTENSION 03/06/2007  . HYPOTHYROIDISM 10/13/2007  . OSTEOARTHRITIS 03/06/2007  . Personal history of colonic polyps 09/30/2008  . TOBACCO USE, QUIT 04/12/2009  . WEAKNESS 11/09/2007  . Graves disease    Past Surgical History  Procedure Laterality Date  . Cataract extraction    . Knee surgery    . Cholecystectomy    . Cardiac catheterization     Family History  Problem Relation Age of Onset  . Cancer Maternal Grandmother     stomach   History  Substance Use Topics  . Smoking status: Former Smoker    Quit date: 07/08/1978  . Smokeless tobacco: Never Used  . Alcohol Use: Yes   OB History    No data available     Review of Systems  All other systems reviewed and are negative.   Allergies  Review of patient's allergies indicates no known allergies.  Home Medications   Prior to Admission medications   Medication Sig Start Date End Date Taking? Authorizing Provider  aspirin 81 MG tablet Take  81 mg by mouth daily.      Historical Provider, MD  felodipine (PLENDIL) 5 MG 24 hr tablet Take 5 mg by mouth daily.    Historical Provider, MD  levothyroxine (SYNTHROID, LEVOTHROID) 75 MCG tablet TAKE 1 TABLET BY MOUTH EVERY DAY 04/27/14   Marletta Lor, MD  lisinopril-hydrochlorothiazide (PRINZIDE,ZESTORETIC) 20-25 MG per tablet Take 1 tablet by mouth daily. 05/18/14   Marletta Lor, MD  pravastatin (PRAVACHOL) 20 MG tablet Take 20 mg by mouth daily.    Historical Provider, MD   BP 198/85 mmHg  Pulse 84  Temp(Src) 98.7 F (37.1 C) (Oral)  Resp 18  SpO2 100%   Physical Exam  General: Well-developed, well-nourished female in no acute distress; appearance consistent with age of record HENT: normocephalic; atraumatic; edema of left side of tongue and left oropharynx, no stridor, no dysphonia, mild dysarthria Eyes: pupils equal, round and reactive to light; extraocular muscles intact Neck: supple Heart: regular rate and rhythm Lungs: clear to auscultation bilaterally Abdomen: soft; nondistended; nontender; bowel sounds present Extremities: No deformity; full range of motion; pulses normal Neurologic: Awake, alert and oriented; motor function intact in all extremities and symmetric; no facial droop Skin: Warm and dry Psychiatric: Normal mood and affect    ED Course  Procedures (including critical care time)   MDM  6:03 AM Edema of tongue has improved significantly. Edema of left oropharynx still present but no worse.  6:56 AM  Patient reports further improvement of throat swelling. Tongue swelling continues to improve.  Patient was advised to discontinue the lisinopril/hydrochlorothiazide. She was advised to contact her primary care physician's office to arrange follow-up.    Wynetta Fines, MD 06/16/14 [phone]

## 2014-06-22 ENCOUNTER — Inpatient Hospital Stay (HOSPITAL_COMMUNITY): Payer: Medicare PPO

## 2014-06-22 ENCOUNTER — Encounter (HOSPITAL_COMMUNITY): Payer: Self-pay | Admitting: Emergency Medicine

## 2014-06-22 ENCOUNTER — Emergency Department (HOSPITAL_COMMUNITY): Payer: Medicare PPO

## 2014-06-22 ENCOUNTER — Inpatient Hospital Stay (HOSPITAL_COMMUNITY)
Admission: EM | Admit: 2014-06-22 | Discharge: 2014-06-24 | DRG: 065 | Disposition: A | Discharge status: Home / Self Care | Payer: Medicare PPO | Attending: Internal Medicine | Admitting: Internal Medicine

## 2014-06-22 DIAGNOSIS — Z8673 Personal history of transient ischemic attack (TIA), and cerebral infarction without residual deficits: Secondary | ICD-10-CM | POA: Diagnosis not present

## 2014-06-22 DIAGNOSIS — I6340 Cerebral infarction due to embolism of unspecified cerebral artery: Principal | ICD-10-CM | POA: Diagnosis present

## 2014-06-22 DIAGNOSIS — I712 Thoracic aortic aneurysm, without rupture: Secondary | ICD-10-CM | POA: Diagnosis present

## 2014-06-22 DIAGNOSIS — I6339 Cerebral infarction due to thrombosis of other cerebral artery: Secondary | ICD-10-CM

## 2014-06-22 DIAGNOSIS — Z87891 Personal history of nicotine dependence: Secondary | ICD-10-CM

## 2014-06-22 DIAGNOSIS — I639 Cerebral infarction, unspecified: Secondary | ICD-10-CM | POA: Diagnosis present

## 2014-06-22 DIAGNOSIS — E785 Hyperlipidemia, unspecified: Secondary | ICD-10-CM | POA: Diagnosis present

## 2014-06-22 DIAGNOSIS — M1990 Unspecified osteoarthritis, unspecified site: Secondary | ICD-10-CM | POA: Diagnosis present

## 2014-06-22 DIAGNOSIS — I700 Atherosclerosis of aorta: Secondary | ICD-10-CM

## 2014-06-22 DIAGNOSIS — I779 Disorder of arteries and arterioles, unspecified: Secondary | ICD-10-CM

## 2014-06-22 DIAGNOSIS — Z79899 Other long term (current) drug therapy: Secondary | ICD-10-CM | POA: Diagnosis not present

## 2014-06-22 DIAGNOSIS — E782 Mixed hyperlipidemia: Secondary | ICD-10-CM | POA: Diagnosis present

## 2014-06-22 DIAGNOSIS — Z7982 Long term (current) use of aspirin: Secondary | ICD-10-CM | POA: Diagnosis not present

## 2014-06-22 DIAGNOSIS — E0500 Thyrotoxicosis with diffuse goiter without thyrotoxic crisis or storm: Secondary | ICD-10-CM | POA: Diagnosis present

## 2014-06-22 DIAGNOSIS — I719 Aortic aneurysm of unspecified site, without rupture: Secondary | ICD-10-CM

## 2014-06-22 DIAGNOSIS — E039 Hypothyroidism, unspecified: Secondary | ICD-10-CM | POA: Diagnosis present

## 2014-06-22 DIAGNOSIS — R531 Weakness: Secondary | ICD-10-CM | POA: Diagnosis not present

## 2014-06-22 DIAGNOSIS — R29898 Other symptoms and signs involving the musculoskeletal system: Secondary | ICD-10-CM

## 2014-06-22 DIAGNOSIS — I10 Essential (primary) hypertension: Secondary | ICD-10-CM | POA: Diagnosis present

## 2014-06-22 DIAGNOSIS — I739 Peripheral vascular disease, unspecified: Secondary | ICD-10-CM

## 2014-06-22 DIAGNOSIS — N179 Acute kidney failure, unspecified: Secondary | ICD-10-CM | POA: Diagnosis present

## 2014-06-22 DIAGNOSIS — R278 Other lack of coordination: Secondary | ICD-10-CM | POA: Diagnosis present

## 2014-06-22 HISTORY — DX: Atherosclerosis of aorta: I70.0

## 2014-06-22 LAB — CBC WITH DIFFERENTIAL/PLATELET
BASOS ABS: 0 10*3/uL (ref 0.0–0.1)
Basophils Relative: 0 % (ref 0–1)
EOS PCT: 4 % (ref 0–5)
Eosinophils Absolute: 0.2 10*3/uL (ref 0.0–0.7)
HCT: 39.4 % (ref 36.0–46.0)
HEMOGLOBIN: 13.4 g/dL (ref 12.0–15.0)
LYMPHS ABS: 1.6 10*3/uL (ref 0.7–4.0)
Lymphocytes Relative: 28 % (ref 12–46)
MCH: 30.4 pg (ref 26.0–34.0)
MCHC: 34 g/dL (ref 30.0–36.0)
MCV: 89.3 fL (ref 78.0–100.0)
MONO ABS: 0.5 10*3/uL (ref 0.1–1.0)
Monocytes Relative: 9 % (ref 3–12)
NEUTROS ABS: 3.4 10*3/uL (ref 1.7–7.7)
Neutrophils Relative %: 59 % (ref 43–77)
Platelets: 208 10*3/uL (ref 150–400)
RBC: 4.41 MIL/uL (ref 3.87–5.11)
RDW: 12.9 % (ref 11.5–15.5)
WBC: 5.7 10*3/uL (ref 4.0–10.5)

## 2014-06-22 LAB — BASIC METABOLIC PANEL
ANION GAP: 11 (ref 5–15)
BUN: 22 mg/dL (ref 6–23)
CALCIUM: 9.6 mg/dL (ref 8.4–10.5)
CHLORIDE: 96 meq/L (ref 96–112)
CO2: 29 meq/L (ref 19–32)
Creatinine, Ser: 1.15 mg/dL — ABNORMAL HIGH (ref 0.50–1.10)
GFR calc Af Amer: 48 mL/min — ABNORMAL LOW (ref >90)
GFR calc non Af Amer: 42 mL/min — ABNORMAL LOW (ref >90)
GLUCOSE: 116 mg/dL — AB (ref 70–99)
Potassium: 3.5 mEq/L — ABNORMAL LOW (ref 3.7–5.3)
Sodium: 136 mEq/L — ABNORMAL LOW (ref 137–147)

## 2014-06-22 MED ORDER — HEPARIN SODIUM (PORCINE) 5000 UNIT/ML IJ SOLN
5000.0000 [IU] | Freq: Three times a day (TID) | INTRAMUSCULAR | Status: DC
Start: 2014-06-22 — End: 2014-06-24
  Administered 2014-06-22 – 2014-06-24 (×4): 5000 [IU] via SUBCUTANEOUS
  Filled 2014-06-22 (×7): qty 1

## 2014-06-22 MED ORDER — ASPIRIN 81 MG PO TABS: 81.0000 mg | ORAL_TABLET | Freq: Every day | ORAL | Status: DC

## 2014-06-22 MED ORDER — SENNOSIDES-DOCUSATE SODIUM 8.6-50 MG PO TABS
1.0000 | ORAL_TABLET | Freq: Every evening | ORAL | Status: DC | PRN
  Filled 2014-06-22: qty 1

## 2014-06-22 MED ORDER — STROKE: EARLY STAGES OF RECOVERY BOOK
Freq: Once | Status: AC
  Administered 2014-06-22: 1
  Filled 2014-06-22: qty 1

## 2014-06-22 MED ORDER — LEVOTHYROXINE SODIUM 75 MCG PO TABS
75.0000 ug | ORAL_TABLET | Freq: Every day | ORAL | Status: DC
  Administered 2014-06-23 – 2014-06-24 (×2): 75 ug via ORAL
  Filled 2014-06-22 (×3): qty 1

## 2014-06-22 MED ORDER — PRAVASTATIN SODIUM 20 MG PO TABS
20.0000 mg | ORAL_TABLET | Freq: Every day | ORAL | Status: DC
  Administered 2014-06-22: 20 mg via ORAL
  Filled 2014-06-22 (×2): qty 1

## 2014-06-22 MED ORDER — ASPIRIN 81 MG PO CHEW
81.0000 mg | CHEWABLE_TABLET | Freq: Every day | ORAL | Status: DC
  Administered 2014-06-22 – 2014-06-24 (×3): 81 mg via ORAL
  Filled 2014-06-22 (×3): qty 1

## 2014-06-22 NOTE — ED Notes (Signed)
PATIENT HAS ARRIVED ON POD C

## 2014-06-22 NOTE — ED Notes (Signed)
Per EMS, pt comes from home with c/o of weakness. Pt A&OX4, NAD noted. Pt denies chest pain or SOB. Pt states she was trying open bottle with left hand to give cat some meds and could not grip the bottle and trying to pick up the pill with right hand, med kept dropping. Pt ambulatory off the stretcher. VSS. 20G IV placed in left AC.

## 2014-06-22 NOTE — ED Provider Notes (Signed)
CSN: [id number]     Arrival date & time 06/22/14  0801 History   First MD Initiated Contact with Patient 06/22/14 [phone]     Chief Complaint  Patient presents with  . Extremity Weakness      HPI Patient has a history of hypertension, hyperlipidemia, prior tobacco abuse who presents with new right handed weakness since she awoke this morning.  She awoke and her symptoms were present.  Last seen normal last night.  She attempted to open a bottle with her right hand and had difficulty opening the bottle as well as grabbing the pills with her right hand.  She states that her right hand "clumsiness" has somewhat improved but is still present.  She was unable to write her name for EMS.  She can write her name at this time.  No prior history of stroke.  Denies headache.  No chest pain or shortness of breath.  Denies abdominal pain.  No nausea vomiting or diarrhea.   Past Medical History  Diagnosis Date  . [place] DISEASE, LUMBAR 12/16/2008  . DIVERTICULOSIS, COLON 09/30/2008  . DYSPNEA 07/13/2008  . HYPERLIPIDEMIA 03/06/2007  . HYPERTENSION 03/06/2007  . HYPOTHYROIDISM 10/13/2007  . OSTEOARTHRITIS 03/06/2007  . Personal history of colonic polyps 09/30/2008  . TOBACCO USE, QUIT 04/12/2009  . WEAKNESS 11/09/2007  . Graves disease    Past Surgical History  Procedure Laterality Date  . Cataract extraction    . Knee surgery    . Cholecystectomy    . Cardiac catheterization     Family History  Problem Relation Age of Onset  . Cancer Maternal Grandmother     stomach   History  Substance Use Topics  . Smoking status: Former Smoker    Quit date: 07/08/1978  . Smokeless tobacco: Never Used  . Alcohol Use: 4.2 oz/week    7 Glasses of wine per week     Comment: "red wine"   OB History    No data available     Review of Systems  All other systems reviewed and are negative.     Allergies  Lisinopril  Home Medications   Prior to Admission medications   Medication Sig Start Date End Date  Taking? Authorizing Provider  aspirin 81 MG tablet Take 81 mg by mouth daily.     Yes Historical Provider, MD  felodipine (PLENDIL) 5 MG 24 hr tablet Take 5 mg by mouth daily.   Yes Historical Provider, MD  hydrochlorothiazide (HYDRODIURIL) 25 MG tablet Take 25 mg by mouth daily.  04/27/14  Yes Historical Provider, MD  levothyroxine (SYNTHROID, LEVOTHROID) 75 MCG tablet TAKE 1 TABLET BY MOUTH EVERY DAY 04/27/14  Yes Marletta Lor, MD  pravastatin (PRAVACHOL) 20 MG tablet Take 20 mg by mouth daily.   Yes Historical Provider, MD   BP 136/69 mmHg  Pulse 69  Temp(Src) 97.8 F (36.6 C) (Oral)  Resp 16  Ht 5\' 6"  (1.676 m)  Wt 153 lb (69.4 kg)  BMI 24.71 kg/m2  SpO2 97% Physical Exam  Constitutional: She is oriented to person, place, and time. She appears well-developed and well-nourished. No distress.  HENT:  Head: Normocephalic and atraumatic.  Eyes: EOM are normal. Pupils are equal, round, and reactive to light.  Neck: Normal range of motion.  Cardiovascular: Normal rate, regular rhythm and normal heart sounds.   Pulmonary/Chest: Effort normal and breath sounds normal.  Abdominal: Soft. She exhibits no distension. There is no tenderness.  Musculoskeletal: Normal range of motion.  Neurological: She  is alert and oriented to person, place, and time.  5/5 strength in major muscle groups of  bilateral upper and lower extremities. Speech normal. No facial asymetry.  Decreased fine motor activity of her right hand as evident when she attempts to write her name.  Skin: Skin is warm and dry.  Psychiatric: She has a normal mood and affect. Judgment normal.  Nursing note and vitals reviewed.   ED Course  Procedures (including critical care time) Labs Review Labs Reviewed  BASIC METABOLIC PANEL - Abnormal; Notable for the following:    Sodium 136 (*)    Potassium 3.5 (*)    Glucose, Bld 116 (*)    Creatinine, Ser 1.15 (*)    GFR calc non Af Amer 42 (*)    GFR calc Af Amer 48 (*)     All other components within normal limits  CBC WITH DIFFERENTIAL    Imaging Review Ct Head Wo Contrast  06/22/2014   CLINICAL DATA:  80 year old female who awoke with right hand numbness and weakness. Initial encounter.  EXAM: CT HEAD WITHOUT CONTRAST  TECHNIQUE: Contiguous axial images were obtained from the base of the skull through the vertex without intravenous contrast.  COMPARISON:  Brain MRI 02/21/2014.  FINDINGS: Visualized paranasal sinuses and mastoids are clear. No acute osseous abnormality identified. No acute orbit or scalp soft tissue findings.  Mild Calcified atherosclerosis at the skull base. There is a degree of intracranial artery dolichoectasia re- identified, including at the dominant distal right vertebral artery and left ICA siphon.  Cerebral volume is within normal limits for age. No ventriculomegaly. No midline shift, mass effect, or evidence of intracranial mass lesion. Normal gray-white matter differentiation throughout the brain. No evidence of cortically based acute infarction identified. No acute intracranial hemorrhage identified. No suspicious intracranial vascular hyperdensity.  IMPRESSION: 1. Chronic intracranial artery dolichoectasia. 2. Otherwise normal for age non contrast CT appearance of the brain.   Electronically Signed   By: Lars Pinks M.D.   On: 06/22/2014 09:21     EKG Interpretation   Date/Time:  Wednesday June 22 2014 08:08:07 EST Ventricular Rate:  77 PR Interval:  181 QRS Duration: 120 QT Interval:  415 QTC Calculation: 470 R Axis:   -36 Text Interpretation:  Sinus rhythm Nonspecific IVCD with LAD LVH with  secondary repolarization abnormality Anterior infarct, old No significant  change was found Confirmed by Jull Harral  MD, Lennette Bihari ([id number]) on 06/22/2014  11:42:33 AM      MDM   Final diagnoses:  Right hand weakness    Concern for small lacunar stroke.  Head CT without acute pathology.  MRI will be obtained.  Neurology is recommending  admission and stroke workup at this time.    Hoy Morn, MD 06/22/14 [id number]

## 2014-06-22 NOTE — Progress Notes (Signed)
DERISHA FUNDERBURKE [id number] Admission Data: 06/22/2014 4:19 PM Attending Provider: Caren Griffins, MD  OMV:[id number] Pilar Plate, MD Consults/ Treatment Team: Treatment Team:  Catarina Hartshorn, MD  Payge ONA ROEHRS is a 80 y.o. female patient admitted from ED awake, alert  & orientated  X 3,  Full Code, VSS - Blood pressure 191/78, pulse 69, temperature 97.7 F (36.5 C), temperature source Oral, resp. rate 15, height 5\' 6"  (1.676 m), weight 69.037 kg (152 lb 3.2 oz), SpO2 99 %. , no c/o shortness of breath, no c/o chest pain, no distress noted.    IV site WDL:  Left A/C SL.  Allergies:   Allergies  Allergen Reactions  . Lisinopril Anaphylaxis    Tongue swelling     Past Medical History  Diagnosis Date  . [place] DISEASE, LUMBAR 12/16/2008  . DIVERTICULOSIS, COLON 09/30/2008  . DYSPNEA 07/13/2008  . HYPERLIPIDEMIA 03/06/2007  . HYPERTENSION 03/06/2007  . HYPOTHYROIDISM 10/13/2007  . OSTEOARTHRITIS 03/06/2007  . Personal history of colonic polyps 09/30/2008  . TOBACCO USE, QUIT 04/12/2009  . WEAKNESS 11/09/2007  . Graves disease       Pt orientation to unit, room and routine. Information packet given to patient/family and safety video watched.  Admission INP armband ID verified with patient/family, and in place. SR up x 2, fall risk assessment complete with Patient and family verbalizing understanding of risks associated with falls. Pt verbalizes an understanding of how to use the call bell and to call for help before getting out of bed.  Skin, clean-dry- intact without evidence of bruising, or skin tears.   No evidence of skin break down noted on exam.     Will cont to monitor and assist as needed.  Dayle Points, RN 06/22/2014 4:19 PM

## 2014-06-22 NOTE — Consult Note (Signed)
Referring Physician: Venora Maples    Chief Complaint: right hand clumsiness  HPI:                                                                                                                                         [name] is an 80 y.o. female who felt normal last night prior to going to bed. Patient awoke this AM at 0630.  She picked her cat up and placed her on the counter. When she attempted to open the pill bottle with her right hand she noted her right hand was clumsy.  She attempted to pick the pills out of the bottle and noted her hand was clumsy. She had some decreased sensation in her right hand but only noted the decreased sensation during my exam. Currently she states her symptoms are improving but not back to baseline. She states she has taken her ASA daily.   Date last known well: Date: 06/21/2014 Time last known well: Time: 22:00 tPA Given: No: no minimal symptoms and improving while in ED  Past Medical History  Diagnosis Date  . [place] DISEASE, LUMBAR 12/16/2008  . DIVERTICULOSIS, COLON 09/30/2008  . DYSPNEA 07/13/2008  . HYPERLIPIDEMIA 03/06/2007  . HYPERTENSION 03/06/2007  . HYPOTHYROIDISM 10/13/2007  . OSTEOARTHRITIS 03/06/2007  . Personal history of colonic polyps 09/30/2008  . TOBACCO USE, QUIT 04/12/2009  . WEAKNESS 11/09/2007  . Graves disease     Past Surgical History  Procedure Laterality Date  . Cataract extraction    . Knee surgery    . Cholecystectomy    . Cardiac catheterization      Family History  Problem Relation Age of Onset  . Cancer Maternal Grandmother     stomach   Social History:  reports that she quit smoking about 35 years ago. She has never used smokeless tobacco. She reports that she drinks about 4.2 oz of alcohol per week. She reports that she does not use illicit drugs.  Allergies:  Allergies  Allergen Reactions  . Lisinopril Anaphylaxis    Tongue swelling    Medications:                                                                                                                            No current facility-administered medications for this encounter.   Current Outpatient Prescriptions  Medication Sig Dispense Refill  .  aspirin 81 MG tablet Take 81 mg by mouth daily.      . felodipine (PLENDIL) 5 MG 24 hr tablet Take 5 mg by mouth daily.    . hydrochlorothiazide (HYDRODIURIL) 25 MG tablet Take 25 mg by mouth daily.     [name] Kitchen levothyroxine (SYNTHROID, LEVOTHROID) 75 MCG tablet TAKE 1 TABLET BY MOUTH EVERY DAY 90 tablet 1  . pravastatin (PRAVACHOL) 20 MG tablet Take 20 mg by mouth daily.       ROS:                                                                                                                                       History obtained from the patient  General ROS: negative for - chills, fatigue, fever, night sweats, weight gain or weight loss Psychological ROS: negative for - behavioral disorder, hallucinations, memory difficulties, mood swings or suicidal ideation Ophthalmic ROS: negative for - blurry vision, double vision, eye pain or loss of vision ENT ROS: negative for - epistaxis, nasal discharge, oral lesions, sore throat, tinnitus or vertigo Allergy and Immunology ROS: negative for - hives or itchy/watery eyes Hematological and Lymphatic ROS: negative for - bleeding problems, bruising or swollen lymph nodes Endocrine ROS: negative for - galactorrhea, hair pattern changes, polydipsia/polyuria or temperature intolerance Respiratory ROS: negative for - cough, hemoptysis, shortness of breath or wheezing Cardiovascular ROS: negative for - chest pain, dyspnea on exertion, edema or irregular heartbeat Gastrointestinal ROS: negative for - abdominal pain, diarrhea, hematemesis, nausea/vomiting or stool incontinence Genito-Urinary ROS: negative for - dysuria, hematuria, incontinence or urinary frequency/urgency Musculoskeletal ROS: negative for - joint swelling or muscular weakness Neurological ROS: as noted in  HPI Dermatological ROS: negative for rash and skin lesion changes  Neurologic Examination:                                                                                                      Blood pressure 144/67, pulse 76, temperature 97.8 F (36.6 C), temperature source Oral, resp. rate 18, height 5\' 6"  (1.676 m), weight 69.4 kg (153 lb), SpO2 98 %.  HEENT-  Normocephalic, no lesions, without obvious abnormality.  Normal external eye and conjunctiva.  Normal TM's bilaterally.  Normal auditory canals and external ears. Normal external nose, mucus membranes and septum.  Normal pharynx. Cardiovascular- regular rate and rhythm, S1, S2 normal, no murmur, click, rub or gallop, pulses palpable throughout   Lungs- chest clear, no wheezing, rales, normal symmetric air entry Abdomen- soft, non-tender;  bowel sounds normal; no masses,  no organomegaly Extremities- less then 2 second capillary refill Lymph-no adenopathy palpable Musculoskeletal-no joint tenderness, deformity or swelling Skin-warm and dry, no hyperpigmentation, vitiligo, or suspicious lesions  Neurological Examination Mental Status: Alert, oriented, thought content appropriate.  Speech fluent without evidence of aphasia.  Able to follow 3 step commands without difficulty. Cranial Nerves: II: Discs flat bilaterally; Visual fields grossly normal, pupils equal, round, reactive to light and accommodation III,IV, VI: ptosis not present, extra-ocular motions intact bilaterally V,VII: smile symmetric, facial light touch sensation normal bilaterally VIII: hearing normal bilaterally IX,X: gag reflex present XI: bilateral shoulder shrug XII: midline tongue extension Motor: Right : Upper extremity   5/5    Left:     Upper extremity   5/5  Lower extremity   5/5     Lower extremity   5/5 --Right hand is clumsy and shows decreased fine finger movements  Tone and bulk:normal tone throughout; no atrophy noted Sensory: Pinprick and light touch  intact throughout with some decreased sensation in the right hand.  Deep Tendon Reflexes: 2+ and symmetric throughout Plantars: Right: downgoing   Left: downgoing Cerebellar: Mild finger to nose dysmetria with the right upper extremity, otherwise no dysmetria noted Gait: not tested due to multiple leads.        Lab Results: Basic Metabolic Panel:  Recent Labs Lab 06/22/14 0810  NA 136*  K 3.5*  CL 96  CO2 29  GLUCOSE 116*  BUN 22  CREATININE 1.15*  CALCIUM 9.6    Liver Function Tests: No results for input(s): AST, ALT, ALKPHOS, BILITOT, PROT, ALBUMIN in the last 168 hours. No results for input(s): LIPASE, AMYLASE in the last 168 hours. No results for input(s): AMMONIA in the last 168 hours.  CBC:  Recent Labs Lab 06/22/14 0810  WBC 5.7  NEUTROABS 3.4  HGB 13.4  HCT 39.4  MCV 89.3  PLT 208    Cardiac Enzymes: No results for input(s): CKTOTAL, CKMB, CKMBINDEX, TROPONINI in the last 168 hours.  Lipid Panel: No results for input(s): CHOL, TRIG, HDL, CHOLHDL, VLDL, LDLCALC in the last 168 hours.  CBG: No results for input(s): GLUCAP in the last 168 hours.  Microbiology: No results found for this or any previous visit.  Coagulation Studies: No results for input(s): LABPROT, INR in the last 72 hours.  Imaging: Ct Head Wo Contrast  06/22/2014   CLINICAL DATA:  80 year old female who awoke with right hand numbness and weakness. Initial encounter.  EXAM: CT HEAD WITHOUT CONTRAST  TECHNIQUE: Contiguous axial images were obtained from the base of the skull through the vertex without intravenous contrast.  COMPARISON:  Brain MRI 02/21/2014.  FINDINGS: Visualized paranasal sinuses and mastoids are clear. No acute osseous abnormality identified. No acute orbit or scalp soft tissue findings.  Mild Calcified atherosclerosis at the skull base. There is a degree of intracranial artery dolichoectasia re- identified, including at the dominant distal right vertebral artery  and left ICA siphon.  Cerebral volume is within normal limits for age. No ventriculomegaly. No midline shift, mass effect, or evidence of intracranial mass lesion. Normal gray-white matter differentiation throughout the brain. No evidence of cortically based acute infarction identified. No acute intracranial hemorrhage identified. No suspicious intracranial vascular hyperdensity.  IMPRESSION: 1. Chronic intracranial artery dolichoectasia. 2. Otherwise normal for age non contrast CT appearance of the brain.   Electronically Signed   By: Lars Pinks M.D.   On: 06/22/2014 09:21    [id number]: Carotid doppler findings.  40-59% RICA stenosis. 65-99% LICA stenosis. Patent vertebral arteries with antegrade flow   Etta Quill PA-C [facility] [phone]  06/22/2014, 10:16 AM   Patient seen and examined.  Clinical course and management discussed.  Necessary edits performed.  I agree with the above.  Assessment and plan of care developed and discussed below.     Assessment: 80 y.o. female presenting with difficulty using her right hand.  Head CT personally reviewed and shows no acute changes.  Suspect a small lacunar event.  Patient with minimal symptoms and not a candidate for tPA.  Further work up recommended.    Stroke Risk Factors - hyperlipidemia, hypertension   Recommendations: 1. HgbA1c, fasting lipid panel 2. MRI, MRA  of the brain without contrast 3. PT consult, OT consult, Speech consult 4. Echocardiogram 5. Carotid dopplers 6. Prophylactic therapy-Antiplatelet med: Aspirin - dose 325mg  daily 7. NPO until RN stroke swallow screen 8. Telemetry monitoring 9. Frequent neuro checks   Alexis Goodell, MD [facility] [phone]  06/22/2014  12:34 PM

## 2014-06-22 NOTE — ED Notes (Signed)
Patient transported to CT 

## 2014-06-22 NOTE — ED Notes (Signed)
Attempted report 

## 2014-06-22 NOTE — H&P (Signed)
History and Physical    SHAHARA HARTSFIELD VZD:[id number] DOB: 1925/01/26 DOA: 06/22/2014  Referring physician: Dr. Venora Maples PCP: Nyoka Cowden, MD  Specialists: Neurology  Chief Complaint: right arm weakness  HPI: Lockie BAY JARQUIN is a 80 y.o. female has a past medical history significant for HTN, HLD, TIA 6 months ago comes in with a chief complaint of right arm weakness that she noticed upon waking up this morning. She denies any other weakness, denies any gait problems, no facial asymmetry, no slurred speech, no visual changes, no headaches/lightheadedness/dizziness. She had a similar episode a few months ago with left arm weakness, she was evaluated in the emergency room, and an MRI at that time was negative. She followed up as an outpatient and had a carotid duplex which showed 60-79% stenosis on left, supposed to have a repeat carotid done in the first week of January. Currently patient feels like her weakness in the right arm is improving, however her strength is not back to baseline. She denies any chest pain or breathing difficulties. The emergency room, CT scan of the head was negative, she has mild AKI which is not far from her baseline but otherwise labs are fairly unremarkable.   Review of Systems: As per history of present illness, otherwise negative  Past Medical History  Diagnosis Date  . [place] DISEASE, LUMBAR 12/16/2008  . DIVERTICULOSIS, COLON 09/30/2008  . DYSPNEA 07/13/2008  . HYPERLIPIDEMIA 03/06/2007  . HYPERTENSION 03/06/2007  . HYPOTHYROIDISM 10/13/2007  . OSTEOARTHRITIS 03/06/2007  . Personal history of colonic polyps 09/30/2008  . TOBACCO USE, QUIT 04/12/2009  . WEAKNESS 11/09/2007  . Graves disease    Past Surgical History  Procedure Laterality Date  . Cataract extraction    . Knee surgery    . Cholecystectomy    . Cardiac catheterization     Social History:  reports that she quit smoking about 35 years ago. She has never used smokeless tobacco. She reports that  she drinks about 4.2 oz of alcohol per week. She reports that she does not use illicit drugs.  Allergies  Allergen Reactions  . Lisinopril Anaphylaxis    Tongue swelling    Family History  Problem Relation Age of Onset  . Cancer Maternal Grandmother     stomach    Prior to Admission medications   Medication Sig Start Date End Date Taking? Authorizing Provider  aspirin 81 MG tablet Take 81 mg by mouth daily.     Yes Historical Provider, MD  felodipine (PLENDIL) 5 MG 24 hr tablet Take 5 mg by mouth daily.   Yes Historical Provider, MD  hydrochlorothiazide (HYDRODIURIL) 25 MG tablet Take 25 mg by mouth daily.  04/27/14  Yes Historical Provider, MD  levothyroxine (SYNTHROID, LEVOTHROID) 75 MCG tablet TAKE 1 TABLET BY MOUTH EVERY DAY 04/27/14  Yes Marletta Lor, MD  pravastatin (PRAVACHOL) 20 MG tablet Take 20 mg by mouth daily.   Yes Historical Provider, MD   Physical Exam: Filed Vitals:   06/22/14 1015 06/22/14 1059 06/22/14 1130 06/22/14 1146  BP: 124/55 136/69 169/64   Pulse: 69  80   Temp:    98.1 F (36.7 C)  TempSrc:      Resp: 22 16 17    Height:      Weight:      SpO2: 96% 97% 97%      General:  No apparent distress  Eyes: no scleral icterus  ENT: moist oropharynx  Neck: supple, no JVD  Cardiovascular: regular rate without  MRG; 2+ peripheral pulses  Respiratory: CTA biL, good air movement without wheezing, rhonchi or crackled  Abdomen: soft, non tender to palpation  Skin: no rashes  Musculoskeletal: no peripheral edema  Psychiatric: normal mood and affect  Neurologic: Cranial nerves grossly normal, strength 4 out of 5 in the right upper extremity, 5 out of 5 in rest  Labs on Admission:  Basic Metabolic Panel:  Recent Labs Lab 06/22/14 0810  NA 136*  K 3.5*  CL 96  CO2 29  GLUCOSE 116*  BUN 22  CREATININE 1.15*  CALCIUM 9.6   CBC:  Recent Labs Lab 06/22/14 0810  WBC 5.7  NEUTROABS 3.4  HGB 13.4  HCT 39.4  MCV 89.3  PLT 208    Radiological Exams on Admission: Ct Head Wo Contrast  06/22/2014   CLINICAL DATA:  80 year old female who awoke with right hand numbness and weakness. Initial encounter.  EXAM: CT HEAD WITHOUT CONTRAST  TECHNIQUE: Contiguous axial images were obtained from the base of the skull through the vertex without intravenous contrast.  COMPARISON:  Brain MRI 02/21/2014.  FINDINGS: Visualized paranasal sinuses and mastoids are clear. No acute osseous abnormality identified. No acute orbit or scalp soft tissue findings.  Mild Calcified atherosclerosis at the skull base. There is a degree of intracranial artery dolichoectasia re- identified, including at the dominant distal right vertebral artery and left ICA siphon.  Cerebral volume is within normal limits for age. No ventriculomegaly. No midline shift, mass effect, or evidence of intracranial mass lesion. Normal gray-white matter differentiation throughout the brain. No evidence of cortically based acute infarction identified. No acute intracranial hemorrhage identified. No suspicious intracranial vascular hyperdensity.  IMPRESSION: 1. Chronic intracranial artery dolichoectasia. 2. Otherwise normal for age non contrast CT appearance of the brain.   Electronically Signed   By: Lars Pinks M.D.   On: 06/22/2014 09:21    EKG: Independently reviewed.  Assessment/Plan Active Problems:   Hypothyroidism   Hyperlipidemia   Essential hypertension   Stroke   CVA (cerebral infarction)    CVA/TIA - Will admit patient to telemetry - Neurology consulted - Obtain MRI/MRA - Obtain 2-D echo - Repeat carotid Doppler - Obtain hemoglobin A1c and lipid panel - Continue aspirin  Hypertension - allow permissive hypertension  Hyperlipidemia - Continue statin  Mild AKI - very close to baseline, monitor renal function in the morning  Hypothyroidism - continue Synthroid   Diet: Nothing by mouth  Fluids: None  DVT Prophylaxis: Heparin   Code Status: Full code   Family Communication: None  Disposition Plan: Admitted to the telemetry   Time spent: 77   Costin M. Cruzita Lederer, MD [facility] Pager [phone]  If 7PM-7AM, please contact night-coverage [email or web address] Password [email or web address] 06/22/2014, 12:10 PM

## 2014-06-22 NOTE — ED Notes (Addendum)
Called MRI to inform RN when MRI is complete tech will transport upstairs.   Stroke book given to family member and Winfred Burn made aware.

## 2014-06-23 DIAGNOSIS — I63132 Cerebral infarction due to embolism of left carotid artery: Secondary | ICD-10-CM

## 2014-06-23 DIAGNOSIS — I639 Cerebral infarction, unspecified: Secondary | ICD-10-CM

## 2014-06-23 LAB — BASIC METABOLIC PANEL
Anion gap: 11 (ref 5–15)
BUN: 19 mg/dL (ref 6–23)
CHLORIDE: 98 meq/L (ref 96–112)
CO2: 29 mEq/L (ref 19–32)
Calcium: 9.1 mg/dL (ref 8.4–10.5)
Creatinine, Ser: 1.12 mg/dL — ABNORMAL HIGH (ref 0.50–1.10)
GFR calc non Af Amer: 43 mL/min — ABNORMAL LOW (ref >90)
GFR, EST AFRICAN AMERICAN: 50 mL/min — AB (ref >90)
Glucose, Bld: 107 mg/dL — ABNORMAL HIGH (ref 70–99)
Potassium: 3.5 mEq/L — ABNORMAL LOW (ref 3.7–5.3)
Sodium: 138 mEq/L (ref 137–147)

## 2014-06-23 LAB — CBC
HEMATOCRIT: 37.5 % (ref 36.0–46.0)
HEMOGLOBIN: 12.4 g/dL (ref 12.0–15.0)
MCH: 29.7 pg (ref 26.0–34.0)
MCHC: 33.1 g/dL (ref 30.0–36.0)
MCV: 89.7 fL (ref 78.0–100.0)
Platelets: 215 10*3/uL (ref 150–400)
RBC: 4.18 MIL/uL (ref 3.87–5.11)
RDW: 13 % (ref 11.5–15.5)
WBC: 4.8 10*3/uL (ref 4.0–10.5)

## 2014-06-23 LAB — LIPID PANEL
CHOLESTEROL: 186 mg/dL (ref 0–200)
HDL: 55 mg/dL (ref >39)
LDL CALC: 109 mg/dL — AB (ref 0–99)
Total CHOL/HDL Ratio: 3.4 RATIO
Triglycerides: 111 mg/dL (ref <150)
VLDL: 22 mg/dL (ref 0–40)

## 2014-06-23 LAB — HEMOGLOBIN A1C
Hgb A1c MFr Bld: 6.3 % — ABNORMAL HIGH (ref <5.7)
Mean Plasma Glucose: 134 mg/dL — ABNORMAL HIGH (ref <117)

## 2014-06-23 MED ORDER — PRAVASTATIN SODIUM 40 MG PO TABS
40.0000 mg | ORAL_TABLET | Freq: Every day | ORAL | Status: DC
  Administered 2014-06-23: 40 mg via ORAL
  Filled 2014-06-23 (×2): qty 1

## 2014-06-23 MED ORDER — SODIUM CHLORIDE 0.9 % IV SOLN
INTRAVENOUS | Status: DC
Start: 2014-06-23 — End: 2014-06-24
  Administered 2014-06-24: 500 mL via INTRAVENOUS
  Administered 2014-06-24: 01:00:00 via INTRAVENOUS

## 2014-06-23 NOTE — Evaluation (Signed)
Speech Language Pathology Evaluation Patient Details Name: [name] MRN: [id number] DOB: Nov 25, 1924 Today's Date: 06/23/2014 Time: 2585-2778 SLP Time Calculation (min) (ACUTE ONLY): 30 min  Problem List:  Patient Active Problem List   Diagnosis Date Noted  . Stroke 06/22/2014  . CVA (cerebral infarction) 06/22/2014  . Bilateral carotid artery disease 05/18/2014  . TOBACCO USE, QUIT 04/12/2009  . [place] DISEASE, LUMBAR 12/16/2008  . DIVERTICULOSIS, COLON 09/30/2008  . Personal history of colonic polyps 09/30/2008  . DYSPNEA 07/13/2008  . WEAKNESS 11/09/2007  . Hypothyroidism 10/13/2007  . Hyperlipidemia 03/06/2007  . Essential hypertension 03/06/2007  . OSTEOARTHRITIS 03/06/2007   Past Medical History:  Past Medical History  Diagnosis Date  . [place] DISEASE, LUMBAR 12/16/2008  . DIVERTICULOSIS, COLON 09/30/2008  . DYSPNEA 07/13/2008  . HYPERLIPIDEMIA 03/06/2007  . HYPERTENSION 03/06/2007  . HYPOTHYROIDISM 10/13/2007  . OSTEOARTHRITIS 03/06/2007  . Personal history of colonic polyps 09/30/2008  . TOBACCO USE, QUIT 04/12/2009  . WEAKNESS 11/09/2007  . Graves disease    Past Surgical History:  Past Surgical History  Procedure Laterality Date  . Cataract extraction    . Knee surgery    . Cholecystectomy    . Cardiac catheterization     HPI:  [name] is a 80 y.o. female has a past medical history significant for HTN, HLD, TIA 6 months ago comes in with a chief complaint of right arm weakness. She had a similar episode a few months ago which showed 60-79% stenosis on left. Currently patient feels like her weakness in the right arm is improving, however her strength is not back to baseline. MRI shows a cluster of small acute infarctions in the left parietal cortical and subcortical brain consistent with shower of emboli in the left MCA territory.    Assessment / Plan / Recommendation Clinical Impression  Pt demonstrates cognitive linguistic function consistent with baseline.  SLP administered the [facility] which the pt scored 30/30 on. Additionally, she and her daughter report no concerns regarding speech, language, or cognition. No further speech treatment is needed, SLP signing off.    SLP Assessment  Patient does not need any further Speech Lanaguage Pathology Services    Follow Up Recommendations       Frequency and Duration        Pertinent Vitals/Pain Pain Assessment: No/denies pain   SLP Goals     SLP Evaluation Prior Functioning  Cognitive/Linguistic Baseline: Within functional limits Type of Home: House Available Help at Discharge: Family Vocation: Retired   Associate Professor  Overall Cognitive Status: Within Functional Limits for tasks assessed Arousal/Alertness: Awake/alert Orientation Level: Oriented X4 Attention: Focused;Sustained;Selective;Alternating Focused Attention: Appears intact Sustained Attention: Appears intact Selective Attention: Appears intact Alternating Attention: Appears intact Memory: Appears intact Awareness: Appears intact Problem Solving: Appears intact Executive Function: Reasoning;Sequencing;Initiating;Decision Making;Self Monitoring;Self Correcting Reasoning: Appears intact Sequencing: Appears intact Decision Making: Appears intact Initiating: Appears intact Self Monitoring: Appears intact Self Correcting: Appears intact Safety/Judgment: Appears intact    Comprehension  Auditory Comprehension Overall Auditory Comprehension: Appears within functional limits for tasks assessed Visual Recognition/Discrimination Discrimination: Within Function Limits Reading Comprehension Reading Status: Within funtional limits    Expression Expression Primary Mode of Expression: Verbal Verbal Expression Overall Verbal Expression: Appears within functional limits for tasks assessed Initiation: No impairment Automatic Speech: Name;Social Response;Counting;Day of week;Month of year Level of Generative/Spontaneous Verbalization:  Conversation Repetition: No impairment Naming: No impairment Pragmatics: No impairment Written Expression Written Expression: Within Functional Limits   Oral / Motor Oral Motor/Sensory Function  Overall Oral Motor/Sensory Function: Appears within functional limits for tasks assessed Motor Speech Overall Motor Speech: Appears within functional limits for tasks assessed   GO     Eden Emms 06/23/2014, 9:59 AM

## 2014-06-23 NOTE — Progress Notes (Signed)
Echo Lab  2D Echocardiogram completed.  [place], RDCS 06/23/2014 10:37 AM

## 2014-06-23 NOTE — Progress Notes (Signed)
PROGRESS NOTE  [name] [id number]:[id number] DOB: 1924/12/25 DOA: 06/22/2014 PCP: Nyoka Cowden, MD  HPI: [name] is a 80 y.o. female has a past medical history significant for HTN, HLD, TIA 6 months ago comes in with a chief complaint of right arm weakness that she noticed upon waking up  Subjective / 24 H Interval events - no complaints, feels like her weakness is improving  Assessment/Plan: Active Problems:   Hypothyroidism   Hyperlipidemia   Essential hypertension   Stroke   CVA (cerebral infarction)   CVA - Neurology consulted, following - MRI positive for CVA - 2-D echo pending, carotids pending - hemoglobin A1c pending, lipid panel with LDL 109 - Continue aspirin  Hypertension - allow permissive hypertension  Hyperlipidemia - Continue statin  Mild AKI - very close to baseline, stable this morning  Hypothyroidism - continue Synthroid    Diet: Diet Heart Fluids: none DVT Prophylaxis: heparin  Code Status: Full Code Family Communication: d/w daughter bedside  Disposition Plan: inpatient, home when ready   Consultants:  Neurology   Procedures:  2D echo   Antibiotics  Anti-infectives    None       Studies  Ct Head Wo Contrast  06/22/2014   CLINICAL DATA:  80 year old female who awoke with right hand numbness and weakness. Initial encounter.  EXAM: CT HEAD WITHOUT CONTRAST  TECHNIQUE: Contiguous axial images were obtained from the base of the skull through the vertex without intravenous contrast.  COMPARISON:  Brain MRI 02/21/2014.  FINDINGS: Visualized paranasal sinuses and mastoids are clear. No acute osseous abnormality identified. No acute orbit or scalp soft tissue findings.  Mild Calcified atherosclerosis at the skull base. There is a degree of intracranial artery dolichoectasia re- identified, including at the dominant distal right vertebral artery and left ICA siphon.  Cerebral volume is within normal limits for age. No  ventriculomegaly. No midline shift, mass effect, or evidence of intracranial mass lesion. Normal gray-white matter differentiation throughout the brain. No evidence of cortically based acute infarction identified. No acute intracranial hemorrhage identified. No suspicious intracranial vascular hyperdensity.  IMPRESSION: 1. Chronic intracranial artery dolichoectasia. 2. Otherwise normal for age non contrast CT appearance of the brain.   Electronically Signed   By: Lars Pinks M.D.   On: 06/22/2014 09:21   Mr Brain Wo Contrast  06/22/2014   CLINICAL DATA:  Right arm weakness upon waking this morning. Similar episode several months ago affecting the left arm. Carotid atherosclerotic disease by noninvasive exam.  EXAM: MRI HEAD WITHOUT CONTRAST  MRA HEAD WITHOUT CONTRAST  TECHNIQUE: Multiplanar, multiecho pulse sequences of the brain and surrounding structures were obtained without intravenous contrast. Angiographic images of the head were obtained using MRA technique without contrast.  COMPARISON:  Head CT same day.  MRI 02/21/2014.  FINDINGS: MRI HEAD FINDINGS  Diffusion imaging shows scattered small foci of acute infarction in the left parietal cortical and subcortical brain consistent with a shower of emboli in the left MCA territory. No large confluent infarction. No swelling. No hemorrhage.  Elsewhere, the brainstem and cerebellum are unremarkable. The cerebral hemispheres show mild age related atrophy with minimal small vessel changes of the white matter, less than often seen in healthy individuals of this age. No mass lesion, hydrocephalus or extra-axial collection. No pituitary mass. Arachnoid herniation into the sella incidentally noted. No inflammatory sinus disease.  MRA HEAD FINDINGS  Both internal carotid arteries are patent. On the right, the ICA supplies the middle cerebral artery territory  only. There is a large posterior communicating artery. No carotid or MCA stenosis.  The left internal carotid  artery is markedly dual echo ectopic in the siphon region, with maximal diameter of 1 cm. No evidence of berry aneurysm. The supra clinoid internal carotid artery is mildly at ectatic. The left middle cerebral artery is widely patent. Both anterior cerebral arteries receive there supply from this vessel in these appear widely patent.  Both vertebral arteries are patent. The right vertebral artery is dominant and supplies the majority of the basilar flow. The left vertebral artery is a small vessel which nearly terminates in PICA, with a small communication beyond that to the basilar. Superior cerebellar and posterior cerebral arteries are patent.  IMPRESSION: Cluster of small acute infarctions in the left parietal cortical and subcortical brain consistent with shower of emboli in the left MCA territory.  Pronounced dolichoectasia of the left internal carotid artery in the siphon region with maximal diameter of 1 cm. Some irregularity in this region could be a source of emboli. No major vessel occlusion or stenosis distal to that.   Electronically Signed   By: Nelson Chimes M.D.   On: 06/22/2014 16:22   Mr Jodene Nam Head/brain Wo Cm  06/22/2014   CLINICAL DATA:  Right arm weakness upon waking this morning. Similar episode several months ago affecting the left arm. Carotid atherosclerotic disease by noninvasive exam.  EXAM: MRI HEAD WITHOUT CONTRAST  MRA HEAD WITHOUT CONTRAST  TECHNIQUE: Multiplanar, multiecho pulse sequences of the brain and surrounding structures were obtained without intravenous contrast. Angiographic images of the head were obtained using MRA technique without contrast.  COMPARISON:  Head CT same day.  MRI 02/21/2014.  FINDINGS: MRI HEAD FINDINGS  Diffusion imaging shows scattered small foci of acute infarction in the left parietal cortical and subcortical brain consistent with a shower of emboli in the left MCA territory. No large confluent infarction. No swelling. No hemorrhage.  Elsewhere, the  brainstem and cerebellum are unremarkable. The cerebral hemispheres show mild age related atrophy with minimal small vessel changes of the white matter, less than often seen in healthy individuals of this age. No mass lesion, hydrocephalus or extra-axial collection. No pituitary mass. Arachnoid herniation into the sella incidentally noted. No inflammatory sinus disease.  MRA HEAD FINDINGS  Both internal carotid arteries are patent. On the right, the ICA supplies the middle cerebral artery territory only. There is a large posterior communicating artery. No carotid or MCA stenosis.  The left internal carotid artery is markedly dual echo ectopic in the siphon region, with maximal diameter of 1 cm. No evidence of berry aneurysm. The supra clinoid internal carotid artery is mildly at ectatic. The left middle cerebral artery is widely patent. Both anterior cerebral arteries receive there supply from this vessel in these appear widely patent.  Both vertebral arteries are patent. The right vertebral artery is dominant and supplies the majority of the basilar flow. The left vertebral artery is a small vessel which nearly terminates in PICA, with a small communication beyond that to the basilar. Superior cerebellar and posterior cerebral arteries are patent.  IMPRESSION: Cluster of small acute infarctions in the left parietal cortical and subcortical brain consistent with shower of emboli in the left MCA territory.  Pronounced dolichoectasia of the left internal carotid artery in the siphon region with maximal diameter of 1 cm. Some irregularity in this region could be a source of emboli. No major vessel occlusion or stenosis distal to that.   Electronically Signed  By: Nelson Chimes M.D.   On: 06/22/2014 16:22     Objective  Filed Vitals:   06/23/14 0400 06/23/14 0815 06/23/14 0817 06/23/14 1235  BP: 157/63 189/64 184/66 141/62  Pulse: 66     Temp: 98.4 F (36.9 C) 98 F (36.7 C)  97.6 F (36.4 C)  TempSrc:  Oral Oral  Oral  Resp: 20 15  16   Height:      Weight:      SpO2: 96% 98%  96%    Intake/Output Summary (Last 24 hours) at 06/23/14 1244 Last data filed at 06/22/14 2000  Gross per 24 hour  Intake    300 ml  Output      0 ml  Net    300 ml   Filed Weights   06/22/14 0807 06/22/14 1603  Weight: 69.4 kg (153 lb) 69.037 kg (152 lb 3.2 oz)    Exam:  General:  NAD  Cardiovascular: RRR  Respiratory: CTA biL  Abdomen: soft, non tender  MSK: no edema  Neuro: strength equal, decreased fine motor function right hand  Data Reviewed: Basic Metabolic Panel:  Recent Labs Lab 06/22/14 0810 06/23/14 0639  NA 136* 138  K 3.5* 3.5*  CL 96 98  CO2 29 29  GLUCOSE 116* 107*  BUN 22 19  CREATININE 1.15* 1.12*  CALCIUM 9.6 9.1   CBC:  Recent Labs Lab 06/22/14 0810 06/23/14 0639  WBC 5.7 4.8  NEUTROABS 3.4  --   HGB 13.4 12.4  HCT 39.4 37.5  MCV 89.3 89.7  PLT 208 215   Scheduled Meds: . aspirin  81 mg Oral Daily  . heparin  5,000 Units Subcutaneous 3 times per day  . levothyroxine  75 mcg Oral QAC breakfast  . pravastatin  40 mg Oral QPC supper   Continuous Infusions:   Time spent: 25 minutes  Marzetta Board, MD [facility] Pager [phone]. If 7 PM - 7 AM, please contact night-coverage at [email or web address], password [email or web address] 06/23/2014, 12:44 PM  LOS: 1 day

## 2014-06-23 NOTE — Progress Notes (Signed)
Gherghe MD made aware of elevated BP with systolic above 612.

## 2014-06-23 NOTE — Progress Notes (Signed)
STROKE TEAM PROGRESS NOTE   HISTORY  [name] is an 80 y.o. female who felt normal last night prior to going to bed. Patient awoke this AM at 0630. She picked her cat up and placed her on the counter. When she attempted to open the pill bottle with her right hand she noted her right hand was clumsy. She attempted to pick the pills out of the bottle and noted her hand was clumsy. She had some decreased sensation in her right hand but only noted the decreased sensation during my exam. Currently she states her symptoms are improving but not back to baseline. She states she has taken her ASA daily.   Date last known well: Date: 06/21/2014 Time last known well: Time: 22:00 tPA Given: No: no minimal symptoms and improving while in ED    SUBJECTIVE (INTERVAL HISTORY) Patient's daughter is at bedside. The patient is feeling well, still has some residual clumsiness of the right hand. The patient believes that the right hand has improved since yesterday.   OBJECTIVE Temp:  [97.7 F (36.5 C)-98.4 F (36.9 C)] 98 F (36.7 C) (12/17 0815) Pulse Rate:  [62-80] 66 (12/17 0400) Cardiac Rhythm:  [-] Heart block (12/17 0753) Resp:  [15-20] 15 (12/17 0815) BP: (145-191)/(53-78) 184/66 mmHg (12/17 0817) SpO2:  [94 %-99 %] 98 % (12/17 0815) Weight:  [152 lb 3.2 oz (69.037 kg)] 152 lb 3.2 oz (69.037 kg) (12/16 1603)  No results for input(s): GLUCAP in the last 168 hours.  Recent Labs Lab 06/22/14 0810 06/23/14 0639  NA 136* 138  K 3.5* 3.5*  CL 96 98  CO2 29 29  GLUCOSE 116* 107*  BUN 22 19  CREATININE 1.15* 1.12*  CALCIUM 9.6 9.1   No results for input(s): AST, ALT, ALKPHOS, BILITOT, PROT, ALBUMIN in the last 168 hours.  Recent Labs Lab 06/22/14 0810 06/23/14 0639  WBC 5.7 4.8  NEUTROABS 3.4  --   HGB 13.4 12.4  HCT 39.4 37.5  MCV 89.3 89.7  PLT 208 215   No results for input(s): CKTOTAL, CKMB, CKMBINDEX, TROPONINI in the last 168 hours. No results for input(s): LABPROT,  INR in the last 72 hours. No results for input(s): COLORURINE, LABSPEC, [place], GLUCOSEU, HGBUR, BILIRUBINUR, KETONESUR, PROTEINUR, UROBILINOGEN, NITRITE, LEUKOCYTESUR in the last 72 hours.  Invalid input(s): APPERANCEUR     Component Value Date/Time   CHOL 186 06/23/2014 0639   TRIG 111 06/23/2014 0639   HDL 55 06/23/2014 0639   CHOLHDL 3.4 06/23/2014 0639   VLDL 22 06/23/2014 0639   LDLCALC 109* 06/23/2014 0639   Lab Results  Component Value Date   HGBA1C 6.0 10/13/2006   No results found for: LABOPIA, COCAINSCRNUR, LABBENZ, AMPHETMU, THCU, LABBARB  No results for input(s): ETH in the last 168 hours.  Ct Head Wo Contrast  06/22/2014   CLINICAL DATA:  80 year old female who awoke with right hand numbness and weakness. Initial encounter.  EXAM: CT HEAD WITHOUT CONTRAST  TECHNIQUE: Contiguous axial images were obtained from the base of the skull through the vertex without intravenous contrast.  COMPARISON:  Brain MRI 02/21/2014.  FINDINGS: Visualized paranasal sinuses and mastoids are clear. No acute osseous abnormality identified. No acute orbit or scalp soft tissue findings.  Mild Calcified atherosclerosis at the skull base. There is a degree of intracranial artery dolichoectasia re- identified, including at the dominant distal right vertebral artery and left ICA siphon.  Cerebral volume is within normal limits for age. No ventriculomegaly. No midline shift,  mass effect, or evidence of intracranial mass lesion. Normal gray-white matter differentiation throughout the brain. No evidence of cortically based acute infarction identified. No acute intracranial hemorrhage identified. No suspicious intracranial vascular hyperdensity.  IMPRESSION: 1. Chronic intracranial artery dolichoectasia. 2. Otherwise normal for age non contrast CT appearance of the brain.   Electronically Signed   By: Lars Pinks M.D.   On: 06/22/2014 09:21   Mr Brain Wo Contrast  06/22/2014   CLINICAL DATA:  Right arm  weakness upon waking this morning. Similar episode several months ago affecting the left arm. Carotid atherosclerotic disease by noninvasive exam.  EXAM: MRI HEAD WITHOUT CONTRAST  MRA HEAD WITHOUT CONTRAST  TECHNIQUE: Multiplanar, multiecho pulse sequences of the brain and surrounding structures were obtained without intravenous contrast. Angiographic images of the head were obtained using MRA technique without contrast.  COMPARISON:  Head CT same day.  MRI 02/21/2014.  FINDINGS: MRI HEAD FINDINGS  Diffusion imaging shows scattered small foci of acute infarction in the left parietal cortical and subcortical brain consistent with a shower of emboli in the left MCA territory. No large confluent infarction. No swelling. No hemorrhage.  Elsewhere, the brainstem and cerebellum are unremarkable. The cerebral hemispheres show mild age related atrophy with minimal small vessel changes of the white matter, less than often seen in healthy individuals of this age. No mass lesion, hydrocephalus or extra-axial collection. No pituitary mass. Arachnoid herniation into the sella incidentally noted. No inflammatory sinus disease.  MRA HEAD FINDINGS  Both internal carotid arteries are patent. On the right, the ICA supplies the middle cerebral artery territory only. There is a large posterior communicating artery. No carotid or MCA stenosis.  The left internal carotid artery is markedly dual echo ectopic in the siphon region, with maximal diameter of 1 cm. No evidence of berry aneurysm. The supra clinoid internal carotid artery is mildly at ectatic. The left middle cerebral artery is widely patent. Both anterior cerebral arteries receive there supply from this vessel in these appear widely patent.  Both vertebral arteries are patent. The right vertebral artery is dominant and supplies the majority of the basilar flow. The left vertebral artery is a small vessel which nearly terminates in PICA, with a small communication beyond that  to the basilar. Superior cerebellar and posterior cerebral arteries are patent.  IMPRESSION: Cluster of small acute infarctions in the left parietal cortical and subcortical brain consistent with shower of emboli in the left MCA territory.  Pronounced dolichoectasia of the left internal carotid artery in the siphon region with maximal diameter of 1 cm. Some irregularity in this region could be a source of emboli. No major vessel occlusion or stenosis distal to that.   Electronically Signed   By: Nelson Chimes M.D.   On: 06/22/2014 16:22   Mr Jodene Nam Head/brain Wo Cm  06/22/2014   CLINICAL DATA:  Right arm weakness upon waking this morning. Similar episode several months ago affecting the left arm. Carotid atherosclerotic disease by noninvasive exam.  EXAM: MRI HEAD WITHOUT CONTRAST  MRA HEAD WITHOUT CONTRAST  TECHNIQUE: Multiplanar, multiecho pulse sequences of the brain and surrounding structures were obtained without intravenous contrast. Angiographic images of the head were obtained using MRA technique without contrast.  COMPARISON:  Head CT same day.  MRI 02/21/2014.  FINDINGS: MRI HEAD FINDINGS  Diffusion imaging shows scattered small foci of acute infarction in the left parietal cortical and subcortical brain consistent with a shower of emboli in the left MCA territory. No large confluent  infarction. No swelling. No hemorrhage.  Elsewhere, the brainstem and cerebellum are unremarkable. The cerebral hemispheres show mild age related atrophy with minimal small vessel changes of the white matter, less than often seen in healthy individuals of this age. No mass lesion, hydrocephalus or extra-axial collection. No pituitary mass. Arachnoid herniation into the sella incidentally noted. No inflammatory sinus disease.  MRA HEAD FINDINGS  Both internal carotid arteries are patent. On the right, the ICA supplies the middle cerebral artery territory only. There is a large posterior communicating artery. No carotid or MCA  stenosis.  The left internal carotid artery is markedly dual echo ectopic in the siphon region, with maximal diameter of 1 cm. No evidence of berry aneurysm. The supra clinoid internal carotid artery is mildly at ectatic. The left middle cerebral artery is widely patent. Both anterior cerebral arteries receive there supply from this vessel in these appear widely patent.  Both vertebral arteries are patent. The right vertebral artery is dominant and supplies the majority of the basilar flow. The left vertebral artery is a small vessel which nearly terminates in PICA, with a small communication beyond that to the basilar. Superior cerebellar and posterior cerebral arteries are patent.  IMPRESSION: Cluster of small acute infarctions in the left parietal cortical and subcortical brain consistent with shower of emboli in the left MCA territory.  Pronounced dolichoectasia of the left internal carotid artery in the siphon region with maximal diameter of 1 cm. Some irregularity in this region could be a source of emboli. No major vessel occlusion or stenosis distal to that.   Electronically Signed   By: Nelson Chimes M.D.   On: 06/22/2014 16:22     Physical Exam  General: The patient is alert and cooperative at the time of the examination.  Skin: No significant peripheral edema is noted.   Neurologic Exam  Mental status: The patient is oriented x 3.  Cranial nerves: Facial symmetry is present. Speech is normal, no aphasia or dysarthria is noted. Extraocular movements are full. Visual fields are full.  Motor: The patient has good strength in all 4 extremities. Minimal clumsiness is noted of the right hand.  Sensory examination: Soft touch sensation on the face, arms, and legs is symmetric.  Coordination: The patient has good finger-nose-finger and heel-to-shin bilaterally.  Gait and station: The gait was not tested.  Reflexes: Deep tendon reflexes are symmetric.   ASSESSMENT/PLAN Ms. CORTNEY BEISSEL  is a 80 y.o. female with history of left brain stroke, embolic presenting with right hand clumsiness. She did not receive IV t-PA due to will symptoms.   Stroke:  Dominant left  infarct embolic possibly secondary to left internal carotid artery disease.  Resultant  mild clumsiness, right arm MRI  IMPRESSION: Cluster of small acute infarctions in the left parietal cortical and subcortical brain consistent with shower of emboli in the left MCA territory.  Pronounced dolichoectasia of the left internal carotid artery in the siphon region with maximal diameter of 1 cm. Some irregularity in this region could be a source of emboli. No major vessel occlusion or stenosis distal to that.    MRA    Carotid Doppler  Preliminary report: There is 1-39% right ICA stenosis. There is 60-79% left ICA stenosis. Vertebral artery flow is antegrade.   2D Echo  pending  LDL 109  HgbA1c pending  Heparin for VTE prophylaxis  Diet Heart thin liquids  aspirin 81 mg orally every day prior to admission, now on aspirin 81 mg  orally every day  Patient counseled to be compliant with her antithrombotic medications  Ongoing aggressive stroke risk factor management  Therapy recommendations:  Pending  Disposition:  Pending  Hypertension  Home meds:   Hydrochlorothiazide, felodipine  Patient counseled to be compliant with her blood pressure medications  Hyperlipidemia  Home meds:  Pravachol 20 mg resumed in hospital  LDL 109, goal < 70  Increased Pravachol dose  Continue statin at discharge  Diabetes  HgbA1c pending goal < 7.0  Other Stroke Risk Factors  Advanced age  Former Cigarette smoker,  ETOH use   Body mass index is 24.58 kg/(m^2).   Hx stroke/TIA, left arm weakness  Other Active Problems  Hypothyroidism  Other Pertinent History  Hospital day # 1  The patient presents with a left brain embolic stroke. There is evidence of 60-79% stenosis on the left internal  carotid artery, and some irregularity of the left internal carotid artery intracranially. However, the patient had a TIA event in the summer 2015 involving the left arm, referable to the right brain. Possibility of a cardiac source is considered. We will consider TEE and loop recorder placement. I will contact cardiology concerning this, if there is delay in getting the study, could be done as an outpatient.  The patient will remain on aspirin currently.   Lenor Coffin [phone] To contact Stroke Continuity provider, please refer to [email or web address] After hours, contact General Neurology

## 2014-06-23 NOTE — Plan of Care (Signed)
Problem: Acute Treatment Outcomes Goal: BP within ordered parameters Outcome: Not Progressing BP meds currrently on hold.

## 2014-06-23 NOTE — Progress Notes (Signed)
VASCULAR LAB PRELIMINARY  PRELIMINARY  PRELIMINARY  PRELIMINARY  Carotid Dopplers completed.    Preliminary report:  There is 1-39% right ICA stenosis.  There is 60-79% left ICA stenosis.  Vertebral artery flow is antegrade.   [name], RVT 06/23/2014, 10:33 AM

## 2014-06-23 NOTE — Progress Notes (Signed)
Utilization review completed.  

## 2014-06-23 NOTE — Evaluation (Signed)
Physical Therapy Evaluation Patient Details Name: [name] MRN: [id number] DOB: 10-Jul-1924 Today's Date: 06/23/2014   History of Present Illness  80 yo female arriving wtih R arm weakness. pending further workup and MRI PMH: DDD, diverticulosis, dyspnea, HTN, osteoarthritis graves disease, cataract extraction, knee surg, cardiac catheterization, TIA 6 months ago  Clinical Impression  Pt at or approaching baseline functioning except for R arm and hand incoordination and mild weakness.   Otherwise gait and mobility are at baseline.  DGI score was 23/24 due to use of rail on stairs.  Will let OT address R UE deficits and follow up therapies and will sign off at this time.    Follow Up Recommendations No PT follow up    Equipment Recommendations  None recommended by PT    Recommendations for Other Services       Precautions / Restrictions Precautions Precautions: None      Mobility  Bed Mobility Overal bed mobility: Independent                Transfers Overall transfer level: Independent                  Ambulation/Gait Ambulation/Gait assistance: Independent Ambulation Distance (Feet): 400 Feet Assistive device: None Gait Pattern/deviations: WFL(Within Functional Limits) Gait velocity: variable speeds, age approp. Gait velocity interpretation: at or above normal speed for age/gender General Gait Details: smooth and fluid gait  Stairs Stairs: Yes Stairs assistance: Modified independent (Device/Increase time) Stair Management: One rail Right;Alternating pattern;Forwards Number of Stairs: 6 General stair comments: smooth and safe with rail  Wheelchair Mobility    Modified Rankin (Stroke Patients Only) Modified Rankin (Stroke Patients Only) Pre-Morbid Rankin Score: No symptoms Modified Rankin: No significant disability     Balance Overall balance assessment: Independent                               Standardized Balance  Assessment Standardized Balance Assessment : Dynamic Gait Index   Dynamic Gait Index Level Surface: Normal Change in Gait Speed: Normal Gait with Horizontal Head Turns: Normal Gait with Vertical Head Turns: Normal Gait and Pivot Turn: Normal Step Over Obstacle: Normal Step Around Obstacles: Normal Steps: Mild Impairment Total Score: 23       Pertinent Vitals/Pain Pain Assessment: No/denies pain    Home Living Family/patient expects to be discharged to:: Private residence Living Arrangements: Alone Available Help at Discharge: Family Type of Home: House       Home Layout: One level   Additional Comments: Completely I, drives, runs errands, plays bridge.    Prior Function Level of Independence: Independent               Hand Dominance        Extremity/Trunk Assessment   Upper Extremity Assessment: Defer to OT evaluation           Lower Extremity Assessment: Overall WFL for tasks assessed      Cervical / Trunk Assessment: Normal  Communication   Communication: No difficulties  Cognition Arousal/Alertness: Awake/alert Behavior During Therapy: WFL for tasks assessed/performed Overall Cognitive Status: Within Functional Limits for tasks assessed                      General Comments      Exercises        Assessment/Plan    PT Assessment Patent does not need any further PT services  PT Diagnosis  Other (comment) (UE weakness/incoordination  especially grip)   PT Problem List    PT Treatment Interventions     PT Goals (Current goals can be found in the Care Plan section) Acute Rehab PT Goals PT Goal Formulation: All assessment and education complete, DC therapy    Frequency     Barriers to discharge        Co-evaluation               End of Session   Activity Tolerance: Patient tolerated treatment well Patient left: in bed;with call bell/phone within reach Nurse Communication: Mobility status         Time:  4158-3094 PT Time Calculation (min) (ACUTE ONLY): 28 min   Charges:   PT Evaluation $Initial PT Evaluation Tier I: 1 Procedure PT Treatments $Gait Training: 8-22 mins   PT G Codes:          Tishana Clinkenbeard, Tessie Fass 06/23/2014, 1:48 PM 06/23/2014  Donnella Sham, PT [phone]  (pager)

## 2014-06-24 ENCOUNTER — Encounter (HOSPITAL_COMMUNITY): Payer: Self-pay | Admitting: *Deleted

## 2014-06-24 ENCOUNTER — Other Ambulatory Visit (HOSPITAL_COMMUNITY): Payer: Self-pay | Admitting: *Deleted

## 2014-06-24 ENCOUNTER — Encounter (HOSPITAL_COMMUNITY)
Admission: EM | Disposition: A | Discharge status: Home / Self Care | Payer: Self-pay | Source: Home / Self Care | Attending: Internal Medicine

## 2014-06-24 DIAGNOSIS — I719 Aortic aneurysm of unspecified site, without rupture: Secondary | ICD-10-CM

## 2014-06-24 DIAGNOSIS — I700 Atherosclerosis of aorta: Secondary | ICD-10-CM

## 2014-06-24 DIAGNOSIS — I6523 Occlusion and stenosis of bilateral carotid arteries: Secondary | ICD-10-CM

## 2014-06-24 HISTORY — DX: Aortic aneurysm of unspecified site, without rupture: I71.9

## 2014-06-24 HISTORY — DX: Atherosclerosis of aorta: I70.0

## 2014-06-24 HISTORY — PX: TEE WITHOUT CARDIOVERSION: SHX5443

## 2014-06-24 SURGERY — ECHOCARDIOGRAM, TRANSESOPHAGEAL
Anesthesia: Moderate Sedation

## 2014-06-24 MED ORDER — FENTANYL CITRATE 0.05 MG/ML IJ SOLN
INTRAMUSCULAR | Status: AC
  Filled 2014-06-24: qty 2

## 2014-06-24 MED ORDER — MIDAZOLAM HCL 10 MG/2ML IJ SOLN
INTRAMUSCULAR | Status: DC | PRN
  Administered 2014-06-24: 1 mg via INTRAVENOUS
  Administered 2014-06-24: 2 mg via INTRAVENOUS

## 2014-06-24 MED ORDER — MIDAZOLAM HCL 5 MG/ML IJ SOLN
INTRAMUSCULAR | Status: AC
  Filled 2014-06-24: qty 2

## 2014-06-24 MED ORDER — DIPHENHYDRAMINE HCL 50 MG/ML IJ SOLN
INTRAMUSCULAR | Status: AC
Start: 2014-06-24 — End: 2014-06-24
  Filled 2014-06-24: qty 1

## 2014-06-24 MED ORDER — ATORVASTATIN CALCIUM 80 MG PO TABS: 80.0000 mg | ORAL_TABLET | Freq: Every day | ORAL | Status: DC

## 2014-06-24 MED ORDER — ASPIRIN EC 325 MG PO TBEC: 325.0000 mg | DELAYED_RELEASE_TABLET | Freq: Every day | ORAL | Status: DC

## 2014-06-24 MED ORDER — BLOOD PRESSURE MONITORING KIT: PACK | Status: DC

## 2014-06-24 MED ORDER — ALPRAZOLAM 0.25 MG PO TABS: 0.2500 mg | ORAL_TABLET | ORAL | Status: DC | PRN

## 2014-06-24 MED ORDER — BUTAMBEN-TETRACAINE-BENZOCAINE 2-2-14 % EX AERO
INHALATION_SPRAY | CUTANEOUS | Status: DC | PRN
  Administered 2014-06-24: 2 via TOPICAL

## 2014-06-24 MED ORDER — FENTANYL CITRATE 0.05 MG/ML IJ SOLN
INTRAMUSCULAR | Status: DC | PRN
  Administered 2014-06-24: 25 ug via INTRAVENOUS

## 2014-06-24 MED ORDER — UNABLE TO FIND: Status: DC

## 2014-06-24 NOTE — Op Note (Signed)
INDICATIONS: stroke  PROCEDURE:   Informed consent was obtained prior to the procedure. The risks, benefits and alternatives for the procedure were discussed and the patient comprehended these risks.  Risks include, but are not limited to, cough, sore throat, vomiting, nausea, somnolence, esophageal and stomach trauma or perforation, bleeding, low blood pressure, aspiration, pneumonia, infection, trauma to the teeth and death.    After a procedural time-out, the oropharynx was anesthetized with 20% benzocaine spray. The patient was given 3 mg versed and 25 mcg fentanyl for moderate sedation.   The transesophageal probe was inserted in the esophagus and stomach without difficulty and multiple views were obtained.  The patient was kept under observation until the patient left the procedure room.  The patient left the procedure room in stable condition.   Agitated microbubble saline contrast was administered.  COMPLICATIONS:    There were no immediate complications.  FINDINGS:  Very severe aortic atherosclerosis. Especially severe in the distal arch and descending thoracic aorta, where there is thick and ulcerated plaque with mobile components. There is severe plaque around the ostia of the left common carotid and left subclavian arteries. The proximal arch and ostium of the innominate artery are not well visualized. The ascending aorta has only mild atherosclerosis and calcification. Otherwise virtually normal exam. Normal LV EF, small left atrium, small appendage without clot and with excellent contractile function. Aortic valve sclerosis with mild insufficiency. No ASD/PFO.  RECOMMENDATIONS:    Suspect aortoembolic source of stroke. Low suspicion for atrial fibrillation - loop recorder probably not indicated. Avoid instrumentation of the aorta (catheterization). Aggressive lipid lowering with statins.  Time Spent Directly with the Patient:  45 minutes   [name] 06/24/2014,  9:10 AM

## 2014-06-24 NOTE — Discharge Summary (Signed)
Physician Discharge Summary  [name] VCB:[id number] DOB: Jan 21, 1925 DOA: 06/22/2014  PCP: Nyoka Cowden, MD  Admit date: 06/22/2014 Discharge date: 06/24/2014  Time spent: 45 minutes  Recommendations for Outpatient Follow-up:  1. Follow up with Dr. Inda Merlin in 1-2 weeks 2. Follow up with Dr. Jannifer Franklin in 2 months   Discharge Diagnoses:  Active Problems:   Hypothyroidism   Hyperlipidemia   Essential hypertension   Stroke   CVA (cerebral infarction)   Aortic arch atherosclerosis   Atherosclerotic ulcer of aorta  Discharge Condition: stable  Diet recommendation: heart healthy  Filed Weights   06/22/14 0807 06/22/14 1603  Weight: 69.4 kg (153 lb) 69.037 kg (152 lb 3.2 oz)    History of present illness:  [name] is a 80 y.o. female has a past medical history significant for HTN, HLD, TIA 6 months ago comes in with a chief complaint of right arm weakness that she noticed upon waking up this morning. She denies any other weakness, denies any gait problems, no facial asymmetry, no slurred speech, no visual changes, no headaches/lightheadedness/dizziness. She had a similar episode a few months ago with left arm weakness, she was evaluated in the emergency room, and an MRI at that time was negative. She followed up as an outpatient and had a carotid duplex which showed 60-79% stenosis on left, supposed to have a repeat carotid done in the first week of January. Currently patient feels like her weakness in the right arm is improving, however her strength is not back to baseline. She denies any chest pain or breathing difficulties. The emergency room, CT scan of the head was negative, she has mild AKI which is not far from her baseline but otherwise labs are fairly unremarkable.   Hospital Course:  Patient was admitted to the hospital with right hand weakness concerning for a CVA. She underwent an MRI shortly after admission which showed a cluster of small acute  infarctions in the left parietal cortical and subcortical brain consistent with shower of emboli in the left MCA territory. Neurology was consulted on patient admission and stroke team has following the patient while hospitalized. She underwent a 2-D echo which showed normal ejection fraction of 60-65% without wall motion abnormalities. She also had a carotid Doppler which showed right 1-39 percent right ICA stenosis and 60-79 percent left ICA stenosis (preliminary read). Given her prior presentation earlier this year with TIA on the right side, and now with a stroke on the left side, it was felt by neurology that her ICA stenosis may not be the main cause. Patient then underwent a TEE on 12/18 which showed very severe aortic atherosclerosis in the distal arch, descending thoracic aorta with mobile components, which is most likely the source for her stroke. Her aspirin was changed from 81 mg to 325 mg daily, and her statin was changed to high-dose atorvastatin of 80 mg per day. She will probably need this dose for the next few weeks, then I would recommend evaluation in the office to make sure that she doesn't have LFT abnormalities or muscle weakness/complaints and/or dose need to be lowered in the future. Patient's neurologic symptoms have improved and she was close to baseline on discharge. For her other medical problems, hypertension, hypothyroidism, she is to resume her previous medications without changes. She has mild renal dysfunction which has been stable and can be monitored further as an outpatient.  Procedures:  2D echo Study Conclusions  - Left ventricle: There was mild concentric  hypertrophy. Systolic function was normal. The estimated ejection fraction was in the range of 60% to 65%. Wall motion was normal; there were noregional wall motion abnormalities. - Aortic valve: There was mild to moderate regurgitation directedcentrally in the LVOT. - Ascending aorta: The ascending aorta was normal  in size. Mildatherosclerotic plaque. - Aortic arch: The aortic arch was normal in size. Severe ulceratedplaque, especially severe in the mid to distal arch. - Descending aorta: The descending aorta was normal in size. Very severe thick ulcerated plaque with mobile components. - Left atrium: No evidence of thrombus in the atrial cavity orappendage. - Right atrium: No evidence of thrombus in the atrial cavity orappendage. - Atrial septum: Echo contrast study showed no right-to-left atriallevel shunt, at baseline or with provocation.   Carotid doppler Preliminary report: There is 1-39% right ICA stenosis. There is 60-79% left ICA stenosis. Vertebral artery flow is antegrade.    TEE 12/18 FINDINGS:  Very severe aortic atherosclerosis. Especially severe in the distal arch and descending thoracic aorta, where there is thick and ulcerated plaque with mobile components. There is severe plaque around the ostia of the left common carotid and left subclavian arteries. The proximal arch and ostium of the innominate artery are not well visualized. The ascending aorta has only mild atherosclerosis and calcification. Otherwise virtually normal exam. Normal LV EF, small left atrium, small appendage without clot and with excellent contractile function. Aortic valve sclerosis with mild insufficiency. No ASD/PFO.  RECOMMENDATIONS:  Suspect aortoembolic source of stroke. Low suspicion for atrial fibrillation - loop recorder probably not indicated. Avoid instrumentation of the aorta (catheterization). Aggressive lipid lowering with statins.   Consultations:  Neurology   Cardiology (TEE)  Discharge Exam: Filed Vitals:   06/24/14 0918 06/24/14 0920 06/24/14 1007 06/24/14 1351  BP: 130/59 142/43 169/59 138/63  Pulse: 65 58 64 66  Temp: 98.2 F (36.8 C)  97.8 F (36.6 C) 98.2 F (36.8 C)  TempSrc: Oral  Oral Oral  Resp: $Remo'15 22 20 18  'mpYaW$ Height:      Weight:      SpO2: 94% 94% 100% 96%     General: NAD Cardiovascular: RRR Respiratory: CTA biL  Discharge Instructions     Medication List    STOP taking these medications        aspirin 81 MG tablet  Replaced by:  aspirin EC 325 MG tablet     pravastatin 20 MG tablet  Commonly known as:  PRAVACHOL      TAKE these medications        aspirin EC 325 MG tablet  Take 1 tablet (325 mg total) by mouth daily.     atorvastatin 80 MG tablet  Commonly known as:  LIPITOR  Take 1 tablet (80 mg total) by mouth daily at 6 PM.     Blood Pressure Monitoring Kit  Blood pressure monitor.     felodipine 5 MG 24 hr tablet  Commonly known as:  PLENDIL  Take 5 mg by mouth daily.     hydrochlorothiazide 25 MG tablet  Commonly known as:  HYDRODIURIL  Take 25 mg by mouth daily.     levothyroxine 75 MCG tablet  Commonly known as:  SYNTHROID, LEVOTHROID  TAKE 1 TABLET BY MOUTH EVERY DAY     UNABLE TO FIND  Blood pressure monitor           Follow-up Information    Follow up with Lenor Coffin, MD In 2 months.   Specialty:  Neurology  Contact information:   [address] [place] [address] [phone]       The results of significant diagnostics from this hospitalization (including imaging, microbiology, ancillary and laboratory) are listed below for reference.    Significant Diagnostic Studies: Ct Head Wo Contrast  06/22/2014   CLINICAL DATA:  80 year old female who awoke with right hand numbness and weakness. Initial encounter.  EXAM: CT HEAD WITHOUT CONTRAST  TECHNIQUE: Contiguous axial images were obtained from the base of the skull through the vertex without intravenous contrast.  COMPARISON:  Brain MRI 02/21/2014.  FINDINGS: Visualized paranasal sinuses and mastoids are clear. No acute osseous abnormality identified. No acute orbit or scalp soft tissue findings.  Mild Calcified atherosclerosis at the skull base. There is a degree of intracranial artery dolichoectasia re- identified,  including at the dominant distal right vertebral artery and left ICA siphon.  Cerebral volume is within normal limits for age. No ventriculomegaly. No midline shift, mass effect, or evidence of intracranial mass lesion. Normal gray-white matter differentiation throughout the brain. No evidence of cortically based acute infarction identified. No acute intracranial hemorrhage identified. No suspicious intracranial vascular hyperdensity.  IMPRESSION: 1. Chronic intracranial artery dolichoectasia. 2. Otherwise normal for age non contrast CT appearance of the brain.   Electronically Signed   By: Lars Pinks M.D.   On: 06/22/2014 09:21   Mr Brain Wo Contrast  06/22/2014   CLINICAL DATA:  Right arm weakness upon waking this morning. Similar episode several months ago affecting the left arm. Carotid atherosclerotic disease by noninvasive exam.  EXAM: MRI HEAD WITHOUT CONTRAST  MRA HEAD WITHOUT CONTRAST  TECHNIQUE: Multiplanar, multiecho pulse sequences of the brain and surrounding structures were obtained without intravenous contrast. Angiographic images of the head were obtained using MRA technique without contrast.  COMPARISON:  Head CT same day.  MRI 02/21/2014.  FINDINGS: MRI HEAD FINDINGS  Diffusion imaging shows scattered small foci of acute infarction in the left parietal cortical and subcortical brain consistent with a shower of emboli in the left MCA territory. No large confluent infarction. No swelling. No hemorrhage.  Elsewhere, the brainstem and cerebellum are unremarkable. The cerebral hemispheres show mild age related atrophy with minimal small vessel changes of the white matter, less than often seen in healthy individuals of this age. No mass lesion, hydrocephalus or extra-axial collection. No pituitary mass. Arachnoid herniation into the sella incidentally noted. No inflammatory sinus disease.  MRA HEAD FINDINGS  Both internal carotid arteries are patent. On the right, the ICA supplies the middle cerebral  artery territory only. There is a large posterior communicating artery. No carotid or MCA stenosis.  The left internal carotid artery is markedly dual echo ectopic in the siphon region, with maximal diameter of 1 cm. No evidence of berry aneurysm. The supra clinoid internal carotid artery is mildly at ectatic. The left middle cerebral artery is widely patent. Both anterior cerebral arteries receive there supply from this vessel in these appear widely patent.  Both vertebral arteries are patent. The right vertebral artery is dominant and supplies the majority of the basilar flow. The left vertebral artery is a small vessel which nearly terminates in PICA, with a small communication beyond that to the basilar. Superior cerebellar and posterior cerebral arteries are patent.  IMPRESSION: Cluster of small acute infarctions in the left parietal cortical and subcortical brain consistent with shower of emboli in the left MCA territory.  Pronounced dolichoectasia of the left internal carotid artery in the siphon region with  maximal diameter of 1 cm. Some irregularity in this region could be a source of emboli. No major vessel occlusion or stenosis distal to that.   Electronically Signed   By: Nelson Chimes M.D.   On: 06/22/2014 16:22   Mr Jodene Nam Head/brain Wo Cm  06/22/2014   CLINICAL DATA:  Right arm weakness upon waking this morning. Similar episode several months ago affecting the left arm. Carotid atherosclerotic disease by noninvasive exam.  EXAM: MRI HEAD WITHOUT CONTRAST  MRA HEAD WITHOUT CONTRAST  TECHNIQUE: Multiplanar, multiecho pulse sequences of the brain and surrounding structures were obtained without intravenous contrast. Angiographic images of the head were obtained using MRA technique without contrast.  COMPARISON:  Head CT same day.  MRI 02/21/2014.  FINDINGS: MRI HEAD FINDINGS  Diffusion imaging shows scattered small foci of acute infarction in the left parietal cortical and subcortical brain consistent  with a shower of emboli in the left MCA territory. No large confluent infarction. No swelling. No hemorrhage.  Elsewhere, the brainstem and cerebellum are unremarkable. The cerebral hemispheres show mild age related atrophy with minimal small vessel changes of the white matter, less than often seen in healthy individuals of this age. No mass lesion, hydrocephalus or extra-axial collection. No pituitary mass. Arachnoid herniation into the sella incidentally noted. No inflammatory sinus disease.  MRA HEAD FINDINGS  Both internal carotid arteries are patent. On the right, the ICA supplies the middle cerebral artery territory only. There is a large posterior communicating artery. No carotid or MCA stenosis.  The left internal carotid artery is markedly dual echo ectopic in the siphon region, with maximal diameter of 1 cm. No evidence of berry aneurysm. The supra clinoid internal carotid artery is mildly at ectatic. The left middle cerebral artery is widely patent. Both anterior cerebral arteries receive there supply from this vessel in these appear widely patent.  Both vertebral arteries are patent. The right vertebral artery is dominant and supplies the majority of the basilar flow. The left vertebral artery is a small vessel which nearly terminates in PICA, with a small communication beyond that to the basilar. Superior cerebellar and posterior cerebral arteries are patent.  IMPRESSION: Cluster of small acute infarctions in the left parietal cortical and subcortical brain consistent with shower of emboli in the left MCA territory.  Pronounced dolichoectasia of the left internal carotid artery in the siphon region with maximal diameter of 1 cm. Some irregularity in this region could be a source of emboli. No major vessel occlusion or stenosis distal to that.   Electronically Signed   By: Nelson Chimes M.D.   On: 06/22/2014 16:22   Labs: Basic Metabolic Panel:  Recent Labs Lab 06/22/14 0810 06/23/14 0639  NA  136* 138  K 3.5* 3.5*  CL 96 98  CO2 29 29  GLUCOSE 116* 107*  BUN 22 19  CREATININE 1.15* 1.12*  CALCIUM 9.6 9.1   CBC:  Recent Labs Lab 06/22/14 0810 06/23/14 0639  WBC 5.7 4.8  NEUTROABS 3.4  --   HGB 13.4 12.4  HCT 39.4 37.5  MCV 89.3 89.7  PLT 208 215   Signed:  Zackeriah Kissler  [facility] 06/24/2014, 4:10 PM

## 2014-06-24 NOTE — Care Management Note (Unsigned)
    Page 1 of 1   06/24/2014     11:46:24 AM CARE MANAGEMENT NOTE 06/24/2014  Patient:  [name], [name]   Account Number:  [id number]  Date Initiated:  06/24/2014  Documentation initiated by:  Tomi Bamberger  Subjective/Objective Assessment:   dx cva, r arm weakness  admit- lives alone.     Action/Plan:   pt eval- no pt f/u   Anticipated DC Date:  06/25/2014   Anticipated DC Plan:  [place]  CM consult      Choice offered to / List presented to:             Status of service:  In process, will continue to follow Medicare Important Message given?  YES (If response is "NO", the following Medicare IM given date fields will be blank) Date Medicare IM given:  06/24/2014 Medicare IM given by:  Tomi Bamberger Date Additional Medicare IM given:   Additional Medicare IM given by:    Discharge Disposition:    Per UR Regulation:  Reviewed for med. necessity/level of care/duration of stay  If discussed at [place] of Stay Meetings, dates discussed:    Comments:  06/24/14 [place], BSN [phone] patient lives alone, per physical therapy no pt f/u needed.

## 2014-06-24 NOTE — Interval H&P Note (Signed)
History and Physical Interval Note:  06/24/2014 8:26 AM  [name]  has presented today for surgery, with the diagnosis of r/o vegitation on valve  The various methods of treatment have been discussed with the patient and family. After consideration of risks, benefits and other options for treatment, the patient has consented to  Procedure(s): TRANSESOPHAGEAL ECHOCARDIOGRAM (TEE) (N/A) as a surgical intervention .  The patient's history has been reviewed, patient examined, no change in status, stable for surgery.  I have reviewed the patient's chart and labs.  Questions were answered to the patient's satisfaction.     Shilo Philipson

## 2014-06-24 NOTE — Progress Notes (Signed)
Occupational Therapy Evaluation Patient Details Name: [name] MRN: [id number] DOB: 1925-01-17 Today's Date: 06/24/2014    History of Present Illness 80 yo female arriving wtih R arm weakness. MRI - Cluster of small acute infarctions in the left parietal cortical and subcortical areas.   Clinical Impression   PTA. Pt independent with all ADL and mobility. Pt presents with RUE deficits. Recommend pt follow up with the neuro outpt center for OT. Recommend D/C home with intermittent S. Given HEP for fine motor/coordiantion skills. Will need perscription for outpt OT     Follow Up Recommendations  Outpatient OT;Supervision - Intermittent    Equipment Recommendations  None recommended by OT    Recommendations for Other Services Other (comment) (neuro out pt OT)     Precautions / Restrictions Precautions Precautions: None      Mobility Bed Mobility Overal bed mobility: Independent                Transfers Overall transfer level: Independent                    Balance                                            ADL                                         General ADL Comments: overall mod I. Difficulty with opeining packages and fine motor skills. Discussed recommendation of intermittent S.      Vision                     Perception     Praxis      Pertinent Vitals/Pain Pain Assessment: No/denies pain     Hand Dominance Right   Extremity/Trunk Assessment Upper Extremity Assessment Upper Extremity Assessment: RUE deficits/detail RUE Deficits / Details: strength WFL. decreased fine motor/gross motor skills. Decreased in hand manipulation skills. Difficulty maniulating objects RUE Sensation:  (Pt reports hand just "feels funny") RUE Coordination: decreased fine motor;decreased gross motor   Lower Extremity Assessment Lower Extremity Assessment: Overall WFL for tasks assessed   Cervical / Trunk  Assessment Cervical / Trunk Assessment: Normal   Communication Communication Communication: No difficulties   Cognition Arousal/Alertness: Awake/alert Behavior During Therapy: WFL for tasks assessed/performed Overall Cognitive Status: Within Functional Limits for tasks assessed                     General Comments       Exercises Exercises: Hand exercises;Hand activities     Shoulder Instructions      Home Living Family/patient expects to be discharged to:: Private residence Living Arrangements: Alone Available Help at Discharge: Family Type of Home: [place]: One level     Bathroom Shower/Tub: Tub/shower unit Shower/tub characteristics: Curtain   Bathroom Accessibility: Yes How Accessible: Accessible via walker     Additional Comments: Completely I, drives, runs errands, plays bridge.      Prior Functioning/Environment Level of Independence: Independent             OT Diagnosis: Generalized weakness   OT Problem List: Decreased coordination;Impaired UE functional use   OT Treatment/Interventions:  OT Goals(Current goals can be found in the care plan section) Acute Rehab OT Goals Patient Stated Goal: to use my hand like normal OT Goal Formulation:  (eval only)  OT Frequency:     Barriers to D/C:            Co-evaluation              End of Session Nurse Communication: Mobility status;Other (comment) (need for neuro outpt OT)  Activity Tolerance: Patient tolerated treatment well Patient left: in bed;with call bell/phone within reach;with family/visitor present   Time: 8309-4076 OT Time Calculation (min): 28 min Charges:  OT General Charges $OT Visit: 1 Procedure OT Evaluation $Initial OT Evaluation Tier I: 1 Procedure OT Treatments $Therapeutic Activity: 8-22 mins G-Codes:    Labrea Eccleston,HILLARY 2014/07/14, 3:25 PM   [facility], OTR/L  [phone] 07/14/14

## 2014-06-24 NOTE — Progress Notes (Signed)
Multiple stroke handouts given to both patient and daughter. Pt able to verbalize understanding. All questions answered. Reinforced symptoms of stroke and importance of activating emergency services .

## 2014-06-24 NOTE — Progress Notes (Signed)
STROKE TEAM PROGRESS NOTE   HISTORY  [name] is an 80 y.o. female who felt normal last night prior to going to bed. Patient awoke this AM at 0630. She picked her cat up and placed her on the counter. When she attempted to open the pill bottle with her right hand she noted her right hand was clumsy. She attempted to pick the pills out of the bottle and noted her hand was clumsy. She had some decreased sensation in her right hand but only noted the decreased sensation during my exam. Currently she states her symptoms are improving but not back to baseline. She states she has taken her ASA daily.   Date last known well: Date: 06/21/2014 Time last known well: Time: 22:00 tPA Given: No: no minimal symptoms and improving while in ED    SUBJECTIVE (INTERVAL HISTORY) Patient's daughter is at bedside. The patient is feeling well, still has some residual clumsiness of the right hand. The patient believes that the right hand continues to improve.   OBJECTIVE Temp:  [97.6 F (36.4 C)-98.8 F (37.1 C)] 97.8 F (36.6 C) (12/18 1007) Pulse Rate:  [58-87] 64 (12/18 1007) Cardiac Rhythm:  [-] Normal sinus rhythm (12/18 0740) Resp:  [12-27] 20 (12/18 1007) BP: (130-204)/(43-78) 169/59 mmHg (12/18 1007) SpO2:  [94 %-100 %] 100 % (12/18 1007)  No results for input(s): GLUCAP in the last 168 hours.  Recent Labs Lab 06/22/14 0810 06/23/14 0639  NA 136* 138  K 3.5* 3.5*  CL 96 98  CO2 29 29  GLUCOSE 116* 107*  BUN 22 19  CREATININE 1.15* 1.12*  CALCIUM 9.6 9.1   No results for input(s): AST, ALT, ALKPHOS, BILITOT, PROT, ALBUMIN in the last 168 hours.  Recent Labs Lab 06/22/14 0810 06/23/14 0639  WBC 5.7 4.8  NEUTROABS 3.4  --   HGB 13.4 12.4  HCT 39.4 37.5  MCV 89.3 89.7  PLT 208 215   No results for input(s): CKTOTAL, CKMB, CKMBINDEX, TROPONINI in the last 168 hours. No results for input(s): LABPROT, INR in the last 72 hours. No results for input(s): COLORURINE, LABSPEC,  [place], GLUCOSEU, HGBUR, BILIRUBINUR, KETONESUR, PROTEINUR, UROBILINOGEN, NITRITE, LEUKOCYTESUR in the last 72 hours.  Invalid input(s): APPERANCEUR     Component Value Date/Time   CHOL 186 06/23/2014 0639   TRIG 111 06/23/2014 0639   HDL 55 06/23/2014 0639   CHOLHDL 3.4 06/23/2014 0639   VLDL 22 06/23/2014 0639   LDLCALC 109* 06/23/2014 0639   Lab Results  Component Value Date   HGBA1C 6.3* 06/23/2014   No results found for: LABOPIA, COCAINSCRNUR, LABBENZ, AMPHETMU, THCU, LABBARB  No results for input(s): ETH in the last 168 hours.  Mr Brain Wo Contrast  06/22/2014   CLINICAL DATA:  Right arm weakness upon waking this morning. Similar episode several months ago affecting the left arm. Carotid atherosclerotic disease by noninvasive exam.  EXAM: MRI HEAD WITHOUT CONTRAST  MRA HEAD WITHOUT CONTRAST  TECHNIQUE: Multiplanar, multiecho pulse sequences of the brain and surrounding structures were obtained without intravenous contrast. Angiographic images of the head were obtained using MRA technique without contrast.  COMPARISON:  Head CT same day.  MRI 02/21/2014.  FINDINGS: MRI HEAD FINDINGS  Diffusion imaging shows scattered small foci of acute infarction in the left parietal cortical and subcortical brain consistent with a shower of emboli in the left MCA territory. No large confluent infarction. No swelling. No hemorrhage.  Elsewhere, the brainstem and cerebellum are unremarkable. The cerebral hemispheres  show mild age related atrophy with minimal small vessel changes of the white matter, less than often seen in healthy individuals of this age. No mass lesion, hydrocephalus or extra-axial collection. No pituitary mass. Arachnoid herniation into the sella incidentally noted. No inflammatory sinus disease.  MRA HEAD FINDINGS  Both internal carotid arteries are patent. On the right, the ICA supplies the middle cerebral artery territory only. There is a large posterior communicating artery. No  carotid or MCA stenosis.  The left internal carotid artery is markedly dual echo ectopic in the siphon region, with maximal diameter of 1 cm. No evidence of berry aneurysm. The supra clinoid internal carotid artery is mildly at ectatic. The left middle cerebral artery is widely patent. Both anterior cerebral arteries receive there supply from this vessel in these appear widely patent.  Both vertebral arteries are patent. The right vertebral artery is dominant and supplies the majority of the basilar flow. The left vertebral artery is a small vessel which nearly terminates in PICA, with a small communication beyond that to the basilar. Superior cerebellar and posterior cerebral arteries are patent.  IMPRESSION: Cluster of small acute infarctions in the left parietal cortical and subcortical brain consistent with shower of emboli in the left MCA territory.  Pronounced dolichoectasia of the left internal carotid artery in the siphon region with maximal diameter of 1 cm. Some irregularity in this region could be a source of emboli. No major vessel occlusion or stenosis distal to that.   Electronically Signed   By: Nelson Chimes M.D.   On: 06/22/2014 16:22   Mr Jodene Nam Head/brain Wo Cm  06/22/2014   CLINICAL DATA:  Right arm weakness upon waking this morning. Similar episode several months ago affecting the left arm. Carotid atherosclerotic disease by noninvasive exam.  EXAM: MRI HEAD WITHOUT CONTRAST  MRA HEAD WITHOUT CONTRAST  TECHNIQUE: Multiplanar, multiecho pulse sequences of the brain and surrounding structures were obtained without intravenous contrast. Angiographic images of the head were obtained using MRA technique without contrast.  COMPARISON:  Head CT same day.  MRI 02/21/2014.  FINDINGS: MRI HEAD FINDINGS  Diffusion imaging shows scattered small foci of acute infarction in the left parietal cortical and subcortical brain consistent with a shower of emboli in the left MCA territory. No large confluent  infarction. No swelling. No hemorrhage.  Elsewhere, the brainstem and cerebellum are unremarkable. The cerebral hemispheres show mild age related atrophy with minimal small vessel changes of the white matter, less than often seen in healthy individuals of this age. No mass lesion, hydrocephalus or extra-axial collection. No pituitary mass. Arachnoid herniation into the sella incidentally noted. No inflammatory sinus disease.  MRA HEAD FINDINGS  Both internal carotid arteries are patent. On the right, the ICA supplies the middle cerebral artery territory only. There is a large posterior communicating artery. No carotid or MCA stenosis.  The left internal carotid artery is markedly dual echo ectopic in the siphon region, with maximal diameter of 1 cm. No evidence of berry aneurysm. The supra clinoid internal carotid artery is mildly at ectatic. The left middle cerebral artery is widely patent. Both anterior cerebral arteries receive there supply from this vessel in these appear widely patent.  Both vertebral arteries are patent. The right vertebral artery is dominant and supplies the majority of the basilar flow. The left vertebral artery is a small vessel which nearly terminates in PICA, with a small communication beyond that to the basilar. Superior cerebellar and posterior cerebral arteries are patent.  IMPRESSION: Cluster of small acute infarctions in the left parietal cortical and subcortical brain consistent with shower of emboli in the left MCA territory.  Pronounced dolichoectasia of the left internal carotid artery in the siphon region with maximal diameter of 1 cm. Some irregularity in this region could be a source of emboli. No major vessel occlusion or stenosis distal to that.   Electronically Signed   By: Nelson Chimes M.D.   On: 06/22/2014 16:22     Physical Exam  General: The patient is alert and cooperative at the time of the examination.  Skin: No significant peripheral edema is  noted.   Neurologic Exam  Mental status: The patient is oriented x 3.  Cranial nerves: Facial symmetry is present. Speech is normal, no aphasia or dysarthria is noted. Extraocular movements are full. Visual fields are full.  Motor: The patient has good strength in all 4 extremities. Minimal clumsiness is noted of the right hand.  Sensory examination: Soft touch sensation on the face, arms, and legs is symmetric.  Coordination: The patient has good finger-nose-finger and heel-to-shin bilaterally.  Gait and station: The gait was not tested.  Reflexes: Deep tendon reflexes are symmetric.   ASSESSMENT/PLAN Ms. KATRIA BOTTS is a 80 y.o. female with history of left brain stroke, embolic presenting with right hand clumsiness. She did not receive IV t-PA due to will symptoms.   Stroke:  Dominant left  infarct embolic possibly secondary to left internal carotid artery disease.  Resultant  mild clumsiness, right arm MRI  IMPRESSION: Cluster of small acute infarctions in the left parietal cortical and subcortical brain consistent with shower of emboli in the left MCA territory.  Pronounced dolichoectasia of the left internal carotid artery in the siphon region with maximal diameter of 1 cm. Some irregularity in this region could be a source of emboli. No major vessel occlusion or stenosis distal to that.    MRA    Carotid Doppler  Preliminary report: There is 1-39% right ICA stenosis. There is 60-79% left ICA stenosis. Vertebral artery flow is antegrade.   2D Echo  pending  LDL 109  HgbA1c pending  Heparin for VTE prophylaxis  Diet Heart thin liquids  aspirin 81 mg orally every day prior to admission, now on aspirin 81 mg orally every day  Patient counseled to be compliant with her antithrombotic medications  Ongoing aggressive stroke risk factor management  Therapy recommendations:  No PT follow-up  Disposition:  Discharge to home  Hypertension  Home meds:    Hydrochlorothiazide, felodipine  Patient counseled to be compliant with her blood pressure medications  Hyperlipidemia  Home meds:  Pravachol 20 mg resumed in hospital  LDL 109, goal < 70  Increased Pravachol dose  Continue statin at discharge  Diabetes  HgbA1c pending goal < 7.0  Other Stroke Risk Factors  Advanced age  Former Cigarette smoker,  ETOH use   Body mass index is 24.58 kg/(m^2).   Hx stroke/TIA, left arm weakness  Other Active Problems  Hypothyroidism  Other Pertinent History  Hospital day # 2  The patient presents with a left brain embolic stroke. There is evidence of 60-79% stenosis on the left internal carotid artery, and some irregularity of the left internal carotid artery intracranially. However, the patient had a TIA event in the summer 2015 involving the left arm, referable to the right brain. Possibility of a cardiac source is considered. We will consider TEE has been done, shows extensive atherosclerotic changes of the aorta,  likely the source of stroke event. There is thick and ulcerated plaque, some mobile components are noted. This would explain bilateral hemispheric symptomatology.  The patient will remain on aspirin currently. The patient will be discharged to home today, I will follow-up with her in our office in about 2 months.   Lenor Coffin [phone] To contact Stroke Continuity provider, please refer to [email or web address] After hours, contact General Neurology

## 2014-06-24 NOTE — Progress Notes (Signed)
  Echocardiogram 2D Echocardiogram has been performed.  [name] 06/24/2014, 9:03 AM

## 2014-06-24 NOTE — Discharge Summary (Signed)
[name] to be D/C'd Home per MD order.  Discussed with the patient and all questions fully answered.    Medication List    STOP taking these medications        aspirin 81 MG tablet  Replaced by:  aspirin EC 325 MG tablet     pravastatin 20 MG tablet  Commonly known as:  PRAVACHOL      TAKE these medications        aspirin EC 325 MG tablet  Take 1 tablet (325 mg total) by mouth daily.     atorvastatin 80 MG tablet  Commonly known as:  LIPITOR  Take 1 tablet (80 mg total) by mouth daily at 6 PM.     Blood Pressure Monitoring Kit  Blood pressure monitor.     felodipine 5 MG 24 hr tablet  Commonly known as:  PLENDIL  Take 5 mg by mouth daily.     hydrochlorothiazide 25 MG tablet  Commonly known as:  HYDRODIURIL  Take 25 mg by mouth daily.     levothyroxine 75 MCG tablet  Commonly known as:  SYNTHROID, LEVOTHROID  TAKE 1 TABLET BY MOUTH EVERY DAY     UNABLE TO FIND  Blood pressure monitor        VVS, Skin clean, dry and intact without evidence of skin break down, no evidence of skin tears noted. IV catheter discontinued intact. Site without signs and symptoms of complications. Dressing and pressure applied.  An After Visit Summary was printed and given to the patient.  D/c education completed with patient/family including follow up instructions, medication list, d/c activities limitations if indicated, with other d/c instructions as indicated by MD - patient able to verbalize understanding, all questions fully answered.   Patient instructed to return to ED, call 911, or call MD for any changes in condition.   Patient escorted via [place], and D/C home via private auto.  Audria Nine F 06/24/2014 3:50 PM

## 2014-06-27 ENCOUNTER — Encounter (HOSPITAL_COMMUNITY): Payer: Self-pay | Admitting: Cardiovascular Disease

## 2014-07-05 ENCOUNTER — Telehealth: Payer: Self-pay | Admitting: *Deleted

## 2014-07-05 NOTE — Telephone Encounter (Signed)
Called and discussed

## 2014-07-05 NOTE — Telephone Encounter (Signed)
[facility] - Client [facility] Patient Name: [name] Gender: Female DOB: [birth year] Age: 80 Y 10 D Return Phone Number: [phone] (Primary) Address: City/State/Zip: [place] Client [facility] - Client Client Site [facility] - Day Contact Type Call Call Type Triage / Clinical Relationship To Patient Self Return Phone Number [phone] (Primary) Chief Complaint Blood Pressure High Initial Comment Caller states she has been taking her BP- 170/92. December 16th she had a stroke. PreDisposition Call Doctor Nurse Assessment Nurse: Marcelline Deist, RN, Kermit Balo Date/Time Eilene Ghazi Time): 07/05/2014 9:31:40 AM Confirm and document reason for call. If symptomatic, describe symptoms. ---Caller states she has been taking her BP- 170/92. December 16th she had a stroke, her fingers wouldn't work. Did an MRI - caused by plaque buildup in aorta. has increased cholesterol rx. Was having an allergic reaction to BP rx, can't take anything ending in pril. Is on a diuretic & small amt. of BP rx. No symptoms. Has the patient traveled out of the country within the last 30 days? ---Not Applicable Does the patient require triage? ---Yes Related visit to physician within the last 2 weeks? ---Yes Does the PT have any chronic conditions? (i.e. diabetes, asthma, etc.) ---Yes List chronic conditions. ---cholesterol, stroke, BP Guidelines Guideline Title Affirmed Question Affirmed Notes Nurse Date/Time (Eastern Time) High Blood Pressure [1] BP # 140/90 AND [2] taking BP medications Marcelline Deist, RN, Kermit Balo 07/05/2014 9:37:19 AM Disp. Time Eilene Ghazi Time) Disposition Final User 07/05/2014 9:48:46 AM See PCP within 2 Weeks Yes Marcelline Deist, RN, Milana Kidney Understands: Yes Disagree/Comply: Comply PLEASE NOTE: All timestamps contained within this report are represented as [place] Standard  Time. CONFIDENTIALTY NOTICE: This fax transmission is intended only for the addressee. It contains information that is legally privileged, confidential or otherwise protected from use or disclosure. If you are not the intended recipient, you are strictly prohibited from reviewing, disclosing, copying using or disseminating any of this information or taking any action in reliance on or regarding this information. If you have received this fax in error, please notify us immediately by telephone so that we can arrange for its return to [place]. Phone: [phone], Toll-Free: [phone], Fax: [phone] Page: 2 of 2 Call Id: [id number] Care Advice Given Per Guideline SEE PCP WITHIN 2 WEEKS: You need an evaluation for this ongoing problem within the next 2 weeks. Call your doctor during regular office hours and make an appointment. REASSURANCE: * Your blood pressure is elevated but you have told me that you are not having any symptoms. * You should see your doctor and have your blood pressure checked within 2 weeks. * You might need to have an adjustment in your medication(s). HYPERTENSION MEDICATIONS: * Untreated hypertension may cause damage to the heart, brain, kidneys, and eyes. * It is important to take prescribed medications as directed. * The goal of blood pressure treatment for most patients with hypertension is to keep the blood pressure under 140/90. * You become worse. * Your blood pressure is over 160/100 CARE ADVICE given per High Blood Pressure (Adult) guideline. CALL BACK IF: * Weakness or numbness of the face, arm or leg on one side of the body occurs * Difficulty walking, difficulty talking, or severe headache occurs * Chest pain or difficulty breathing occurs LIFESTYLE MODIFICATIONS - The following things can help you reduce your blood pressure: After Care Instructions Given Call Event Type User Date / Time Description Comments User: Donald Siva, RN Date/Time Eilene Ghazi  Time):  07/05/2014 9:50:59 AM Caller is on Felodipine 5 mg & HCTZ 25 mg daily. Would like Dr. Zigmund Daniel to contact her re: increasing her BP rx. She has an appt. next week, but needs the peace of mind knowing that she is doing everything for her BP now. She is not having symptoms, but is concerned that her BP is not as low as it could be.

## 2014-07-06 ENCOUNTER — Telehealth: Payer: Self-pay | Admitting: Internal Medicine

## 2014-07-06 ENCOUNTER — Ambulatory Visit (INDEPENDENT_AMBULATORY_CARE_PROVIDER_SITE_OTHER): Payer: Medicare PPO | Admitting: Nurse Practitioner

## 2014-07-06 ENCOUNTER — Encounter: Payer: Self-pay | Admitting: Nurse Practitioner

## 2014-07-06 VITALS — BP 131/76 | HR 89 | Temp 97.6°F | Ht 65.5 in | Wt 149.0 lb

## 2014-07-06 DIAGNOSIS — R202 Paresthesia of skin: Secondary | ICD-10-CM

## 2014-07-06 DIAGNOSIS — IMO0001 Reserved for inherently not codable concepts without codable children: Secondary | ICD-10-CM

## 2014-07-06 DIAGNOSIS — R030 Elevated blood-pressure reading, without diagnosis of hypertension: Secondary | ICD-10-CM

## 2014-07-06 NOTE — Progress Notes (Signed)
Subjective:     [name] is a 80 y.o. female presents with concerns of elevated BP & tingling in bilat LE that started last night. She has recent HX of lacunar stroke w/no residual effects. Recent TEE revealed significant atherosclerosis in descending aorta. Pt is worried that she has a blood clot & that BP is too high. She is accompanied by her daughter,Joan. HTN: home readings-160-179/75-90; an isolated reading of 203/110 this am. Current med plendil 5 mg, no changes have been made since stroke. Paresthesia bilat feet: mild, persistent, constant. Lipitor dose was doubled last week. She is now on 80 mg. She had no SE on 40 mg. She denies cough, palpitations, cold extremities, or pain w/walking or dependency.  The following portions of the patient's history were reviewed and updated as appropriate: allergies, current medications, past medical history, past social history, past surgical history and problem list.  Review of Systems Constitutional: negative for fatigue Cardiovascular: negative for chest pressure/discomfort and exertional chest pressure/discomfort Integument/breast: negative for rash and skin color change Musculoskeletal:negative for muscle weakness Neurological: negative for dizziness and headaches    Objective:    BP 131/76 mmHg  Pulse 89  Temp(Src) 97.6 F (36.4 C) (Temporal)  Ht 5' 5.5" (1.664 m)  Wt 149 lb (67.586 kg)  BMI 24.41 kg/m2  SpO2 96% BP 131/76 mmHg  Pulse 89  Temp(Src) 97.6 F (36.4 C) (Temporal)  Ht 5' 5.5" (1.664 m)  Wt 149 lb (67.586 kg)  BMI 24.41 kg/m2  SpO2 96% General appearance: alert, cooperative, appears stated age and no distress Head: Normocephalic, without obvious abnormality, atraumatic Eyes: negative findings: lids and lashes normal and conjunctivae and sclerae normal Neck: no carotid bruit, supple, symmetrical, trachea midline and thyroid not enlarged, symmetric, no tenderness/mass/nodules Lungs: clear to auscultation  bilaterally Heart: regular rate and rhythm, S1, S2 normal, no murmur, click, rub or gallop Extremities: extremities normal, atraumatic, no cyanosis or edema Pulses: 2+ and symmetric checked femoral, dorsalis pedis & pedal pulses Skin: Skin color, texture, turgor normal. No rashes or lesions Neurologic: Grossly normal    Assessment:Plan    1. Elevated blood pressure No changes today. 2. Paresthesia of both feet Likely SE increased lipitor Keep appt w/ Dr Burnice Logan 1/8

## 2014-07-06 NOTE — Patient Instructions (Signed)
Given that you have equal strong pulses & warm etremities, blockage of arteries is VERY unlikely.  Statins can cause myalgia & paresthesia. Tingling sensation in lower legs is likely due to increased dose and may resolve after 2 weeks of starting medication.  Please discuss further concerns with dr Burnice Logan.

## 2014-07-06 NOTE — Telephone Encounter (Signed)
Per Dr. Marthann Schiller request called and spoke with [name].  Advised that Dr. Raliegh Ip spoke with pt on yesterday about her high blood pressure readings.  [name] states that pt has tingling bilaterally in there feet and ankles that started on yesterday.  Per [name] this early this morning pt's bp was 203/110 and it has come down to 167 over something.  Dr. Raliegh Ip is aware of these symptoms and advised that pt be seen today for reassurance.Sent to [place] to be scheduled.

## 2014-07-06 NOTE — Telephone Encounter (Signed)
[facility] - Client [facility] Patient Name: [name] Gender: Female DOB: [birth year] Age: 80 Y 10 D Return Phone Number: [phone] (Primary) Address: City/State/Zip:  Client [facility] - Client Client Site [facility] - [facility] Contact Type Call Call Type Triage / Clinical Relationship To Patient Self Return Phone Number [phone] (Primary) Chief Complaint Blood Pressure High Initial Comment Caller states she has been taking her BP- 170/92. December 16th she had a stroke. PreDisposition Call Doctor Nurse Assessment Nurse: Marcelline Deist, RN, Kermit Balo Date/Time Eilene Ghazi Time): 07/05/2014 9:31:40 AM Confirm and document reason for call. If symptomatic, describe symptoms. ---Caller states she has been taking her BP- 170/92. December 16th she had a stroke, her fingers wouldn't work. Did an MRI - caused by plaque buildup in aorta. has increased cholesterol rx. Was having an allergic reaction to BP rx, can't take anything ending in pril. Is on a diuretic & small amt. of BP rx. No symptoms. Has the patient traveled out of the country within the last 30 days? ---Not Applicable Does the patient require triage? ---Yes Related visit to physician within the last 2 weeks? ---Yes Does the PT have any chronic conditions? (i.e. diabetes, asthma, etc.) ---Yes List chronic conditions. ---cholesterol, stroke, BP Guidelines Guideline Title Affirmed Question Affirmed Notes Nurse Date/Time (Eastern Time) High Blood Pressure [1] BP # 140/90 AND [2] taking BP medications Marcelline Deist, RN, Kermit Balo 07/05/2014 9:37:19 AM Disp. Time Eilene Ghazi Time) Disposition Final User 07/05/2014 9:48:46 AM See PCP within 2 Weeks Yes Marcelline Deist, RN, Milana Kidney Understands: Yes Disagree/Comply: Comply PLEASE NOTE: All timestamps contained within this report are represented as [place] Standard  Time. CONFIDENTIALTY NOTICE: This fax transmission is intended only for the addressee. It contains information that is legally privileged, confidential or otherwise protected from use or disclosure. If you are not the intended recipient, you are strictly prohibited from reviewing, disclosing, copying using or disseminating any of this information or taking any action in reliance on or regarding this information. If you have received this fax in error, please notify us immediately by telephone so that we can arrange for its return to [place]. Phone: [phone], Toll-Free: [phone], Fax: [phone] Page: 2 of 2 Call Id: [id number] Care Advice Given Per Guideline SEE PCP WITHIN 2 WEEKS: You need an evaluation for this ongoing problem within the next 2 weeks. Call your doctor during regular office hours and make an appointment. REASSURANCE: * Your blood pressure is elevated but you have told me that you are not having any symptoms. * You should see your doctor and have your blood pressure checked within 2 weeks. * You might need to have an adjustment in your medication(s). HYPERTENSION MEDICATIONS: * Untreated hypertension may cause damage to the heart, brain, kidneys, and eyes. * It is important to take prescribed medications as directed. * The goal of blood pressure treatment for most patients with hypertension is to keep the blood pressure under 140/90. * You become worse. * Your blood pressure is over 160/100 CARE ADVICE given per High Blood Pressure (Adult) guideline. CALL BACK IF: * Weakness or numbness of the face, arm or leg on one side of the body occurs * Difficulty walking, difficulty talking, or severe headache occurs * Chest pain or difficulty breathing occurs LIFESTYLE MODIFICATIONS - The following things can help you reduce your blood pressure: After Care Instructions Given Call Event Type User Date / Time Description Comments User: Donald Siva, RN Date/Time Eilene Ghazi  Time):  07/05/2014 9:50:59 AM Caller is on Felodipine 5 mg & HCTZ 25 mg daily. Would like Dr. Zigmund Daniel to contact her re: increasing her BP rx. She has an appt. next week, but needs the peace of mind knowing that she is doing everything for her BP now. She is not having symptoms, but is concerned that her BP is not as low as it could be.

## 2014-07-06 NOTE — Progress Notes (Signed)
Pre visit review using our clinic review tool, if applicable. No additional management support is needed unless otherwise documented below in the visit note. 

## 2014-07-12 ENCOUNTER — Encounter (HOSPITAL_COMMUNITY): Payer: Medicare PPO

## 2014-07-15 ENCOUNTER — Ambulatory Visit (INDEPENDENT_AMBULATORY_CARE_PROVIDER_SITE_OTHER): Payer: Medicare PPO | Admitting: Internal Medicine

## 2014-07-15 ENCOUNTER — Encounter: Payer: Self-pay | Admitting: Internal Medicine

## 2014-07-15 VITALS — BP 142/74 | HR 91 | Temp 97.9°F | Resp 20 | Ht 65.25 in | Wt 150.0 lb

## 2014-07-15 DIAGNOSIS — I700 Atherosclerosis of aorta: Secondary | ICD-10-CM

## 2014-07-15 DIAGNOSIS — E785 Hyperlipidemia, unspecified: Secondary | ICD-10-CM

## 2014-07-15 DIAGNOSIS — I639 Cerebral infarction, unspecified: Secondary | ICD-10-CM

## 2014-07-15 DIAGNOSIS — I10 Essential (primary) hypertension: Secondary | ICD-10-CM

## 2014-07-15 MED ORDER — ATORVASTATIN CALCIUM 80 MG PO TABS: 80.0000 mg | ORAL_TABLET | Freq: Every day | ORAL | Status: DC

## 2014-07-15 NOTE — Progress Notes (Signed)
Subjective:    Patient ID: [name], female    DOB: Aug 21, 1924, 81 y.o.   MRN: [id number]  HPI Admit date: 06/22/2014 Discharge date: 06/24/2014  Time spent: 45 minutes  Recommendations for Outpatient Follow-up:  1. Follow up with Dr. Inda Merlin in 1-2 weeks 2. Follow up with Dr. Jannifer Franklin in 2 months  Discharge Diagnoses:  Active Problems:  Hypothyroidism  Hyperlipidemia  Essential hypertension  Stroke  CVA (cerebral infarction)  Aortic arch atherosclerosis  Atherosclerotic ulcer of aorta  Discharge Condition: stable     81 year old patient who is seen following a recent hospital discharge. She was felt to have a embolic stroke due to aortic arch atherosclerosis. She presented with some apraxia involving the right hand. Neurologically she has done quite well. She has been concerned about hypertension with some labile readings. Since her last visit here, she had angioedema related to  Ace inhibition.  No new neurological deficits.  In general doing quite well.  Hospital records reviewed  Past Medical History  Diagnosis Date  . [place] DISEASE, LUMBAR 12/16/2008  . DIVERTICULOSIS, COLON 09/30/2008  . DYSPNEA 07/13/2008  . HYPERLIPIDEMIA 03/06/2007  . HYPERTENSION 03/06/2007  . HYPOTHYROIDISM 10/13/2007  . OSTEOARTHRITIS 03/06/2007  . Personal history of colonic polyps 09/30/2008  . TOBACCO USE, QUIT 04/12/2009  . WEAKNESS 11/09/2007  . Graves disease   . Aortic arch atherosclerosis 06/24/2014  . Atherosclerotic ulcer of aorta 06/24/2014    History   Social History  . Marital Status: Divorced    Spouse Name: N/A    Number of Children: N/A  . Years of Education: N/A   Occupational History  . Not on file.   Social History Main Topics  . Smoking status: Former Smoker    Quit date: 07/08/1978  . Smokeless tobacco: Never Used  . Alcohol Use: 4.2 oz/week    7 Glasses of wine per week     Comment: "red wine"  . Drug Use: No  . Sexual Activity: Not on file    Other Topics Concern  . Not on file   Social History Narrative    Past Surgical History  Procedure Laterality Date  . Cataract extraction    . Knee surgery    . Cholecystectomy    . Cardiac catheterization    . Tee without cardioversion N/A 06/24/2014    Procedure: TRANSESOPHAGEAL ECHOCARDIOGRAM (TEE);  Surgeon: Sanda Klein, MD;  Location: [facility] ENDOSCOPY;  Service: Cardiovascular;  Laterality: N/A;    Family History  Problem Relation Age of Onset  . Cancer Maternal Grandmother     stomach    Allergies  Allergen Reactions  . Lisinopril Anaphylaxis    Tongue swelling    Current Outpatient Prescriptions on File Prior to Visit  Medication Sig Dispense Refill  . aspirin 81 MG tablet Take 81 mg by mouth 4 (four) times daily.    [name] Kitchen atorvastatin (LIPITOR) 80 MG tablet Take 1 tablet (80 mg total) by mouth daily at 6 PM. 30 tablet 0  . Blood Pressure Monitoring KIT Blood pressure monitor. 1 kit 0  . felodipine (PLENDIL) 5 MG 24 hr tablet Take 5 mg by mouth daily.    . hydrochlorothiazide (HYDRODIURIL) 25 MG tablet Take 25 mg by mouth daily.     [name] Kitchen levothyroxine (SYNTHROID, LEVOTHROID) 75 MCG tablet TAKE 1 TABLET BY MOUTH EVERY DAY 90 tablet 1   No current facility-administered medications on file prior to visit.    BP 142/74 mmHg  Pulse 91  Temp(Src) 97.9  F (36.6 C) (Oral)  Resp 20  Ht 5' 5.25" (1.657 m)  Wt 150 lb (68.04 kg)  BMI 24.78 kg/m2     Review of Systems  HENT: Negative for congestion, dental problem, hearing loss, rhinorrhea, sinus pressure, sore throat and tinnitus.   Eyes: Negative for pain, discharge and visual disturbance.  Respiratory: Negative for cough and shortness of breath.   Cardiovascular: Negative for chest pain, palpitations and leg swelling.  Gastrointestinal: Negative for nausea, vomiting, abdominal pain, diarrhea, constipation, blood in stool and abdominal distention.  Genitourinary: Negative for dysuria, urgency, frequency, hematuria,  flank pain, vaginal bleeding, vaginal discharge, difficulty urinating, vaginal pain and pelvic pain.  Musculoskeletal: Negative for joint swelling, arthralgias and gait problem.  Skin: Negative for rash.  Neurological: Positive for weakness. Negative for dizziness, syncope, speech difficulty, numbness and headaches.  Hematological: Negative for adenopathy.  Psychiatric/Behavioral: Negative for behavioral problems, dysphoric mood and agitation. The patient is not nervous/anxious.        Objective:   Physical Exam  Constitutional: She is oriented to person, place, and time. She appears well-developed and well-nourished.  Blood pressure 140-160 over 70-74  HENT:  Head: Normocephalic.  Right Ear: External ear normal.  Left Ear: External ear normal.  Mouth/Throat: Oropharynx is clear and moist.  Eyes: Conjunctivae and EOM are normal. Pupils are equal, round, and reactive to light.  Neck: Normal range of motion. Neck supple. No thyromegaly present.  Cardiovascular: Normal rate, regular rhythm, normal heart sounds and intact distal pulses.   Pulmonary/Chest: Effort normal and breath sounds normal.  Abdominal: Soft. Bowel sounds are normal. She exhibits no mass. There is no tenderness.  Musculoskeletal: Normal range of motion.  Lymphadenopathy:    She has no cervical adenopathy.  Neurological: She is alert and oriented to person, place, and time. No cranial nerve deficit. Coordination normal.  Skin: Skin is warm and dry. No rash noted.  Psychiatric: She has a normal mood and affect. Her behavior is normal.          Assessment & Plan:   Hypertension.  Blood pressure readings have been slightly labile but generally seem to be well controlled.  We'll continue present regimen and continue close follow-up Status post atheroembolic CVA. Hypothyroidism Carotid artery disease Aortic arch atherosclerosis.  Continue aggressive risk factor modification.  Patient presently on atorvastatin  80  Recheck 3 months No change in therapy

## 2014-07-15 NOTE — Progress Notes (Signed)
Pre visit review using our clinic review tool, if applicable. No additional management support is needed unless otherwise documented below in the visit note. 

## 2014-07-15 NOTE — Patient Instructions (Signed)
Limit your sodium (Salt) intake  Please check your blood pressure on a regular basis.  If it is consistently greater than 180/100, please make an office appointment.  Return in 3 months for follow-up

## 2014-08-26 ENCOUNTER — Encounter: Payer: Self-pay | Admitting: Neurology

## 2014-08-26 ENCOUNTER — Ambulatory Visit (INDEPENDENT_AMBULATORY_CARE_PROVIDER_SITE_OTHER): Payer: Medicare PPO | Admitting: Neurology

## 2014-08-26 VITALS — BP 156/70 | HR 90 | Ht 66.0 in | Wt 153.0 lb

## 2014-08-26 DIAGNOSIS — Z8673 Personal history of transient ischemic attack (TIA), and cerebral infarction without residual deficits: Secondary | ICD-10-CM | POA: Insufficient documentation

## 2014-08-26 DIAGNOSIS — I700 Atherosclerosis of aorta: Secondary | ICD-10-CM

## 2014-08-26 DIAGNOSIS — I10 Essential (primary) hypertension: Secondary | ICD-10-CM

## 2014-08-26 DIAGNOSIS — Z8669 Personal history of other diseases of the nervous system and sense organs: Secondary | ICD-10-CM

## 2014-08-26 HISTORY — DX: Personal history of transient ischemic attack (TIA), and cerebral infarction without residual deficits: Z86.73

## 2014-08-26 MED ORDER — FELODIPINE ER 10 MG PO TB24: 10.0000 mg | ORAL_TABLET | Freq: Every day | ORAL | Status: DC

## 2014-08-26 NOTE — Progress Notes (Signed)
Reason for visit: Stroke  [name] is an 81 y.o. female  History of present illness:  [name] is an 81 year old white female with a history of hypertension who was admitted to the hospital around 06/21/2014 with an episode of transient clumsiness involving the right hand. The patient was found to have a stroke involving the left parietal area that appeared to be embolic in nature. The patient had a prior history of a TIA type event referable to the right brain. For this reason, the patient underwent a workup looking for a cardiac source of embolus. The patient underwent a TEE evaluation, and she was found to have extensive atherosclerotic changes in the aortic arch that was the likely source of the embolic events. The patient indicates that her systolic blood pressures may run in the 150s to 180 range, and there can be wide variations from one point of the day to the next. She is on blood pressure medications. She denies any further episodes of transient weakness. She feels well at this time, she is back to doing everything she was prior to the stroke event. She remains on aspirin therapy.  Past Medical History  Diagnosis Date  . [place] DISEASE, LUMBAR 12/16/2008  . DIVERTICULOSIS, COLON 09/30/2008  . DYSPNEA 07/13/2008  . HYPERLIPIDEMIA 03/06/2007  . HYPERTENSION 03/06/2007  . HYPOTHYROIDISM 10/13/2007  . OSTEOARTHRITIS 03/06/2007  . Personal history of colonic polyps 09/30/2008  . TOBACCO USE, QUIT 04/12/2009  . WEAKNESS 11/09/2007  . Graves disease   . Aortic arch atherosclerosis 06/24/2014  . Atherosclerotic ulcer of aorta 06/24/2014  . History of embolic stroke 5/36/6440    Left brain    Past Surgical History  Procedure Laterality Date  . Cataract extraction    . Knee surgery    . Cholecystectomy    . Cardiac catheterization    . Tee without cardioversion N/A 06/24/2014    Procedure: TRANSESOPHAGEAL ECHOCARDIOGRAM (TEE);  Surgeon: Sanda Klein, MD;  Location: [facility] ENDOSCOPY;   Service: Cardiovascular;  Laterality: N/A;    Family History  Problem Relation Age of Onset  . Cancer Maternal Grandmother     stomach    Social history:  reports that she quit smoking about 36 years ago. She has never used smokeless tobacco. She reports that she drinks about 4.2 oz of alcohol per week. She reports that she does not use illicit drugs.    Allergies  Allergen Reactions  . Lisinopril Anaphylaxis    Tongue swelling    Medications:  Current Outpatient Prescriptions on File Prior to Visit  Medication Sig Dispense Refill  . atorvastatin (LIPITOR) 80 MG tablet Take 1 tablet (80 mg total) by mouth daily at 6 PM. 90 tablet 0  . Blood Pressure Monitoring KIT Blood pressure monitor. 1 kit 0  . hydrochlorothiazide (HYDRODIURIL) 25 MG tablet Take 25 mg by mouth daily.     [name] Kitchen levothyroxine (SYNTHROID, LEVOTHROID) 75 MCG tablet TAKE 1 TABLET BY MOUTH EVERY DAY 90 tablet 1   No current facility-administered medications on file prior to visit.    ROS:  Out of a complete 14 system review of symptoms, the patient complains only of the following symptoms, and all other reviewed systems are negative.  Stroke event  Blood pressure 156/70, pulse 90, height _0  (1.676 m), weight 153 lb (69.4 kg).   Blood pressure, right arm, sitting is 168/76. Blood pressure, left arm, sitting is 178/78.  Physical Exam  General: The patient is alert and cooperative at  the time of the examination.  Skin: No significant peripheral edema is noted.   Neurologic Exam  Mental status: The patient is oriented x 3.  Cranial nerves: Facial symmetry is present. Speech is normal, no aphasia or dysarthria is noted. Extraocular movements are full. Visual fields are full.  Motor: The patient has good strength in all 4 extremities.  Sensory examination: Soft touch sensation is symmetric on the face, arms, and legs.  Coordination: The patient has good finger-nose-finger and heel-to-shin  bilaterally.  Gait and station: The patient has a normal gait. Tandem gait is unsteady. Romberg is negative. No drift is seen.  Reflexes: Deep tendon reflexes are symmetric.   MRI brain/MRA head 06/22/14:  IMPRESSION: Cluster of small acute infarctions in the left parietal cortical and subcortical brain consistent with shower of emboli in the left MCA territory.  Pronounced dolichoectasia of the left internal carotid artery in the siphon region with maximal diameter of 1 cm. Some irregularity in this region could be a source of emboli. No major vessel occlusion or stenosis distal to that.  * MRI scan images were reviewed online.   Carotid Doppler study 06/23/2014:  Summary: Right: moderate to severe calcific plaque origin ICA. 1-39% ICA stenosis. Left: mild to moderate calcific irregular plaque CCA. Severe calcific plaque origin ICA. 60-79% ICA stenosis. Bilateral vertebral artery flow is antegrade.   Assessment/Plan:  1. Embolic stroke event  2. Hypertension  3. Aortic arch atherosclerosis  The patient is to remain on aspirin. Her blood pressures are still running a bit too high, the Plendil will be increased to 10 mg daily. She will have annual evaluations for the left internal carotid artery stenosis. 60-79% stenosis on the left was noted. She will follow-up if needed.  Jill Alexanders MD 08/26/2014 6:56 PM  [facility] [address] [place], [place] [address]  Phone [phone] Fax [phone]

## 2014-08-26 NOTE — Patient Instructions (Signed)
Stroke Prevention Some medical conditions and behaviors are associated with an increased chance of having a stroke. You may prevent a stroke by making healthy choices and managing medical conditions. HOW CAN I REDUCE MY RISK OF HAVING A STROKE?   Stay physically active. Get at least 30 minutes of activity on most or all days.  Do not smoke. It may also be helpful to avoid exposure to secondhand smoke.  Limit alcohol use. Moderate alcohol use is considered to be:  No more than 2 drinks per day for men.  No more than 1 drink per day for nonpregnant women.  Eat healthy foods. This involves:  Eating 5 or more servings of fruits and vegetables a day.  Making dietary changes that address high blood pressure (hypertension), high cholesterol, diabetes, or obesity.  Manage your cholesterol levels.  Making food choices that are high in fiber and low in saturated fat, trans fat, and cholesterol may control cholesterol levels.  Take any prescribed medicines to control cholesterol as directed by your health care provider.  Manage your diabetes.  Controlling your carbohydrate and sugar intake is recommended to manage diabetes.  Take any prescribed medicines to control diabetes as directed by your health care provider.  Control your hypertension.  Making food choices that are low in salt (sodium), saturated fat, trans fat, and cholesterol is recommended to manage hypertension.  Take any prescribed medicines to control hypertension as directed by your health care provider.  Maintain a healthy weight.  Reducing calorie intake and making food choices that are low in sodium, saturated fat, trans fat, and cholesterol are recommended to manage weight.  Stop drug abuse.  Avoid taking birth control pills.  Talk to your health care provider about the risks of taking birth control pills if you are over 35 years old, smoke, get migraines, or have ever had a blood clot.  Get evaluated for sleep  disorders (sleep apnea).  Talk to your health care provider about getting a sleep evaluation if you snore a lot or have excessive sleepiness.  Take medicines only as directed by your health care provider.  For some people, aspirin or blood thinners (anticoagulants) are helpful in reducing the risk of forming abnormal blood clots that can lead to stroke. If you have the irregular heart rhythm of atrial fibrillation, you should be on a blood thinner unless there is a good reason you cannot take them.  Understand all your medicine instructions.  Make sure that other conditions (such as anemia or atherosclerosis) are addressed. SEEK IMMEDIATE MEDICAL CARE IF:   You have sudden weakness or numbness of the face, arm, or leg, especially on one side of the body.  Your face or eyelid droops to one side.  You have sudden confusion.  You have trouble speaking (aphasia) or understanding.  You have sudden trouble seeing in one or both eyes.  You have sudden trouble walking.  You have dizziness.  You have a loss of balance or coordination.  You have a sudden, severe headache with no known cause.  You have new chest pain or an irregular heartbeat. Any of these symptoms may represent a serious problem that is an emergency. Do not wait to see if the symptoms will go away. Get medical help at once. Call your local emergency services (911 in [place] Do not drive yourself to the hospital. Document Released: 08/01/2004 Document Revised: 11/08/2013 Document Reviewed: 12/25/2012 Exit[employer] Patient Information 2015 [employer]. This information is not intended to replace advice given   to you by your health care provider. Make sure you discuss any questions you have with your health care provider.  

## 2014-09-09 ENCOUNTER — Other Ambulatory Visit: Payer: Self-pay | Admitting: Internal Medicine

## 2014-09-23 DIAGNOSIS — Z961 Presence of intraocular lens: Secondary | ICD-10-CM | POA: Diagnosis not present

## 2014-09-28 ENCOUNTER — Telehealth: Payer: Self-pay | Admitting: Neurology

## 2014-09-28 DIAGNOSIS — R29898 Other symptoms and signs involving the musculoskeletal system: Secondary | ICD-10-CM

## 2014-09-28 MED ORDER — CLOPIDOGREL BISULFATE 75 MG PO TABS: 75.0000 mg | ORAL_TABLET | Freq: Every day | ORAL | Status: DC

## 2014-09-28 NOTE — Telephone Encounter (Signed)
I called the patient. The patient has had 2 or 3 events of transient hand numbness and clumsiness that have occurred over the last 2 days. The episodes are quite brief, lasting only a few seconds. She has no other associated symptoms of vision changes, slurred speech, weakness of other extremities. This is similar to her original stroke event. The patient case that her blood pressures are running a little high, between 151 and 761 systolic. The patient is on aspirin, 325 mg daily. I will add Plavix to this temporarily, she will be seen in office in the next several days, I will repeat a MRI of the brain. If the patient has an event lasting more than 10 or 15 minutes, she is to contact 911, go to the hospital immediately. We may the future consider getting an EEG study to exclude the possibility of focal seizure events.

## 2014-09-28 NOTE — Telephone Encounter (Signed)
Patient stated she lost feeling in R hand on yesterday, but hand feels better today.  Questioning what should she do?  Please call and advise.

## 2014-09-28 NOTE — Telephone Encounter (Signed)
Patient states that since Monday she has lost feeling/use in her right hand periodically. Each time the feeling/use comes back but wants to speak to Dr. Jannifer Franklin. I have scheduled an appointment for 3/28 at 0800 and will forward this to Dr. Jannifer Franklin.

## 2014-10-03 ENCOUNTER — Encounter: Payer: Self-pay | Admitting: Neurology

## 2014-10-03 ENCOUNTER — Ambulatory Visit (INDEPENDENT_AMBULATORY_CARE_PROVIDER_SITE_OTHER): Payer: Medicare PPO | Admitting: Neurology

## 2014-10-03 VITALS — BP 144/76 | HR 107 | Ht 66.0 in | Wt 150.4 lb

## 2014-10-03 DIAGNOSIS — Z8669 Personal history of other diseases of the nervous system and sense organs: Secondary | ICD-10-CM

## 2014-10-03 DIAGNOSIS — R202 Paresthesia of skin: Secondary | ICD-10-CM | POA: Diagnosis not present

## 2014-10-03 DIAGNOSIS — Z8673 Personal history of transient ischemic attack (TIA), and cerebral infarction without residual deficits: Secondary | ICD-10-CM

## 2014-10-03 MED ORDER — AMLODIPINE BESYLATE 2.5 MG PO TABS: 2.5000 mg | ORAL_TABLET | Freq: Every day | ORAL | Status: DC

## 2014-10-03 NOTE — Patient Instructions (Signed)
Stroke Prevention Some medical conditions and behaviors are associated with an increased chance of having a stroke. You may prevent a stroke by making healthy choices and managing medical conditions. HOW CAN I REDUCE MY RISK OF HAVING A STROKE?   Stay physically active. Get at least 30 minutes of activity on most or all days.  Do not smoke. It may also be helpful to avoid exposure to secondhand smoke.  Limit alcohol use. Moderate alcohol use is considered to be:  No more than 2 drinks per day for men.  No more than 1 drink per day for nonpregnant women.  Eat healthy foods. This involves:  Eating 5 or more servings of fruits and vegetables a day.  Making dietary changes that address high blood pressure (hypertension), high cholesterol, diabetes, or obesity.  Manage your cholesterol levels.  Making food choices that are high in fiber and low in saturated fat, trans fat, and cholesterol may control cholesterol levels.  Take any prescribed medicines to control cholesterol as directed by your health care provider.  Manage your diabetes.  Controlling your carbohydrate and sugar intake is recommended to manage diabetes.  Take any prescribed medicines to control diabetes as directed by your health care provider.  Control your hypertension.  Making food choices that are low in salt (sodium), saturated fat, trans fat, and cholesterol is recommended to manage hypertension.  Take any prescribed medicines to control hypertension as directed by your health care provider.  Maintain a healthy weight.  Reducing calorie intake and making food choices that are low in sodium, saturated fat, trans fat, and cholesterol are recommended to manage weight.  Stop drug abuse.  Avoid taking birth control pills.  Talk to your health care provider about the risks of taking birth control pills if you are over 35 years old, smoke, get migraines, or have ever had a blood clot.  Get evaluated for sleep  disorders (sleep apnea).  Talk to your health care provider about getting a sleep evaluation if you snore a lot or have excessive sleepiness.  Take medicines only as directed by your health care provider.  For some people, aspirin or blood thinners (anticoagulants) are helpful in reducing the risk of forming abnormal blood clots that can lead to stroke. If you have the irregular heart rhythm of atrial fibrillation, you should be on a blood thinner unless there is a good reason you cannot take them.  Understand all your medicine instructions.  Make sure that other conditions (such as anemia or atherosclerosis) are addressed. SEEK IMMEDIATE MEDICAL CARE IF:   You have sudden weakness or numbness of the face, arm, or leg, especially on one side of the body.  Your face or eyelid droops to one side.  You have sudden confusion.  You have trouble speaking (aphasia) or understanding.  You have sudden trouble seeing in one or both eyes.  You have sudden trouble walking.  You have dizziness.  You have a loss of balance or coordination.  You have a sudden, severe headache with no known cause.  You have new chest pain or an irregular heartbeat. Any of these symptoms may represent a serious problem that is an emergency. Do not wait to see if the symptoms will go away. Get medical help at once. Call your local emergency services (911 in [place] Do not drive yourself to the hospital. Document Released: 08/01/2004 Document Revised: 11/08/2013 Document Reviewed: 12/25/2012 Exit[employer] Patient Information 2015 [employer]. This information is not intended to replace advice given   to you by your health care provider. Make sure you discuss any questions you have with your health care provider.  

## 2014-10-03 NOTE — Progress Notes (Signed)
Reason for visit: Cerebrovascular disease  [name] is an 81 y.o. female  History of present illness:  [name] is an 81 year old right-handed white female with a history of a stroke event involving the left parietal area of the brain in December 2015. The patient was found to have severe atherosclerotic changes in the aortic arch that was felt to be the etiology of her stroke episode. The patient has done relatively well with aspirin therapy since that time. The patient has indicated that her blood pressures have drifted upwards, she generally is running in the 379K to 240X systolic. The patient is on a maximum dose of Plendil, and she takes Hydrochlorothiazide as well. The patient is on a statin drug, and within the last several weeks, she has noted some numbness that has developed in the feet and toes. The patient has a restless leg type sensation in the legs. She denies any gait instability. Within the last week, the patient has developed episodes of right hand numbness and clumsiness. The episodes have occurred on 2 occasions, the last one was 6 days ago. The patient had symptoms for only a few seconds, with full resolution. No associated speech, vision, or gait problems were noted. The patient returns to this office for an evaluation. She reports at least one episode of feeling heart pounding at nighttime, this occurred 5 days ago.  Past Medical History  Diagnosis Date  . [place] DISEASE, LUMBAR 12/16/2008  . DIVERTICULOSIS, COLON 09/30/2008  . DYSPNEA 07/13/2008  . HYPERLIPIDEMIA 03/06/2007  . HYPERTENSION 03/06/2007  . HYPOTHYROIDISM 10/13/2007  . OSTEOARTHRITIS 03/06/2007  . Personal history of colonic polyps 09/30/2008  . TOBACCO USE, QUIT 04/12/2009  . WEAKNESS 11/09/2007  . Graves disease   . Aortic arch atherosclerosis 06/24/2014  . Atherosclerotic ulcer of aorta 06/24/2014  . History of embolic stroke 7/35/3299    Left brain    Past Surgical History  Procedure Laterality Date    . Cataract extraction    . Knee surgery    . Cholecystectomy    . Cardiac catheterization    . Tee without cardioversion N/A 06/24/2014    Procedure: TRANSESOPHAGEAL ECHOCARDIOGRAM (TEE);  Surgeon: Sanda Klein, MD;  Location: [facility] ENDOSCOPY;  Service: Cardiovascular;  Laterality: N/A;    Family History  Problem Relation Age of Onset  . Cancer Maternal Grandmother     stomach    Social history:  reports that she quit smoking about 36 years ago. She has never used smokeless tobacco. She reports that she drinks about 2.4 oz of alcohol per week. She reports that she does not use illicit drugs.    Allergies  Allergen Reactions  . Lisinopril Anaphylaxis    Tongue swelling    Medications:  Prior to Admission medications   Medication Sig Start Date End Date Taking? Authorizing Provider  aspirin 325 MG tablet Take 325 mg by mouth daily.    Historical Provider, MD  atorvastatin (LIPITOR) 80 MG tablet Take 1 tablet (80 mg total) by mouth daily at 6 PM. 07/15/14   Marletta Lor, MD  Blood Pressure Monitoring KIT Blood pressure monitor. 06/24/14   Costin Karlyne Greenspan, MD  clopidogrel (PLAVIX) 75 MG tablet Take 1 tablet (75 mg total) by mouth daily. 09/28/14   Kathrynn Ducking, MD  felodipine (PLENDIL) 10 MG 24 hr tablet Take 1 tablet (10 mg total) by mouth daily. 08/26/14   Kathrynn Ducking, MD  felodipine (PLENDIL) 5 MG 24 hr tablet TAKE 1  TABLET BY MOUTH EVERY DAY 09/09/14   Marletta Lor, MD  hydrochlorothiazide (HYDRODIURIL) 25 MG tablet Take 25 mg by mouth daily.  04/27/14   Historical Provider, MD  levothyroxine (SYNTHROID, LEVOTHROID) 75 MCG tablet TAKE 1 TABLET BY MOUTH EVERY DAY 04/27/14   Marletta Lor, MD    ROS:  Out of a complete 14 system review of symptoms, the patient complains only of the following symptoms, and all other reviewed systems are negative.  Transient weakness  Blood pressure 144/76, pulse 107, height $RemoveBef'5\' 6"'DfUMOPhILG$  (1.676 m), weight 150 lb 6.4 oz (68.221  kg).  Physical Exam  General: The patient is alert and cooperative at the time of the examination.  Skin: No significant peripheral edema is noted.   Neurologic Exam  Mental status: The patient is alert and oriented x 3 at the time of the examination. The patient has apparent normal recent and remote memory, with an apparently normal attention span and concentration ability.   Cranial nerves: Facial symmetry is present. Speech is normal, no aphasia or dysarthria is noted. Extraocular movements are full. Visual fields are full.  Motor: The patient has good strength in all 4 extremities.  Sensory examination: Soft touch sensation is symmetric on the face, arms, and legs.  Coordination: The patient has good finger-nose-finger and heel-to-shin bilaterally.  Gait and station: The patient has a normal gait. Tandem gait is normal. Romberg is negative. No drift is seen.  Reflexes: Deep tendon reflexes are symmetric.   MRI brain/MRA head 06/22/14:  IMPRESSION: Cluster of small acute infarctions in the left parietal cortical and subcortical brain consistent with shower of emboli in the left MCA territory.  Pronounced dolichoectasia of the left internal carotid artery in the siphon region with maximal diameter of 1 cm. Some irregularity in this region could be a source of emboli. No major vessel occlusion or stenosis distal to that.  * MRI scan images were reviewed online. I agree with the written report.    Assessment/Plan:  1. History of cerebrovascular disease, left parietal stroke  2. Recent transient right hand numbness and weakness  3. Hypertension  The patient is still running blood pressures that are too high. The patient has had Plavix added to the aspirin for about 90 days given the recent transient events of right hand numbness and weakness. The patient has been set up for MRI evaluation of the brain, I will contact her when this report is available. The patient  indicates that she has had at least one episode where she felt her heart pounding at night, if this recurs, I will consider a 30 day heart monitor for her. The patient is to follow-up in about 6 months otherwise, if she has a prolonged event of right hand numbness or clumsiness lasting greater than 10 minutes, she is to go to the emergency room for an evaluation. The blood pressures remained too elevated. I will start low-dose Norvasc at this time.  Jill Alexanders MD 10/03/2014 8:53 AM  [facility] [address] [place], [place] [address]  Phone [phone] Fax [phone]

## 2014-10-07 ENCOUNTER — Encounter: Payer: Self-pay | Admitting: Internal Medicine

## 2014-10-07 ENCOUNTER — Ambulatory Visit (INDEPENDENT_AMBULATORY_CARE_PROVIDER_SITE_OTHER): Payer: Medicare PPO | Admitting: Internal Medicine

## 2014-10-07 VITALS — BP 140/68 | HR 82 | Temp 97.5°F | Resp 20 | Ht 66.0 in | Wt 151.0 lb

## 2014-10-07 DIAGNOSIS — IMO0001 Reserved for inherently not codable concepts without codable children: Secondary | ICD-10-CM

## 2014-10-07 DIAGNOSIS — M159 Polyosteoarthritis, unspecified: Secondary | ICD-10-CM

## 2014-10-07 DIAGNOSIS — Z8669 Personal history of other diseases of the nervous system and sense organs: Secondary | ICD-10-CM

## 2014-10-07 DIAGNOSIS — M150 Primary generalized (osteo)arthritis: Secondary | ICD-10-CM | POA: Diagnosis not present

## 2014-10-07 DIAGNOSIS — R030 Elevated blood-pressure reading, without diagnosis of hypertension: Secondary | ICD-10-CM | POA: Diagnosis not present

## 2014-10-07 DIAGNOSIS — Z8673 Personal history of transient ischemic attack (TIA), and cerebral infarction without residual deficits: Secondary | ICD-10-CM

## 2014-10-07 NOTE — Progress Notes (Signed)
Subjective:    Patient ID: [name], female    DOB: Oct 12, 1924, 81 y.o.   MRN: [id number]  HPI 81 year old patient who has a history of hypertension and cerebrovascular disease.  She was evaluated for a left parietal embolic stroke in December of last year.  She was seen by neurology recently with recurrent transient weakness and paresthesias of the right hand.  Follow-up MRI is scheduled.  Plavix was added to her regimen for 90 days and amlodipine 2.5 milligrams was added to her regimen due to suboptimal blood pressure control.  She has had no significant lower extremity edema.  She is also been on maximal felodipine.  Generally feels well today.  Blood pressure readings have been 458-099 systolic and 83-38 diastolic  Past Medical History  Diagnosis Date  . [place] DISEASE, LUMBAR 12/16/2008  . DIVERTICULOSIS, COLON 09/30/2008  . DYSPNEA 07/13/2008  . HYPERLIPIDEMIA 03/06/2007  . HYPERTENSION 03/06/2007  . HYPOTHYROIDISM 10/13/2007  . OSTEOARTHRITIS 03/06/2007  . Personal history of colonic polyps 09/30/2008  . TOBACCO USE, QUIT 04/12/2009  . WEAKNESS 11/09/2007  . Graves disease   . Aortic arch atherosclerosis 06/24/2014  . Atherosclerotic ulcer of aorta 06/24/2014  . History of embolic stroke 2/50/5397    Left brain    History   Social History  . Marital Status: Divorced    Spouse Name: N/A  . Number of Children: 4  . Years of Education: college   Occupational History  . Not on file.   Social History Main Topics  . Smoking status: Former Smoker    Quit date: 07/08/1978  . Smokeless tobacco: Never Used  . Alcohol Use: 2.4 oz/week    4 Glasses of wine per week     Comment: "red wine"  . Drug Use: No  . Sexual Activity: Not on file   Other Topics Concern  . Not on file   Social History Narrative   Patient is right handed.   Patient drinks 1 cup caffeine daily.    Past Surgical History  Procedure Laterality Date  . Cataract extraction    . Knee surgery    .  Cholecystectomy    . Cardiac catheterization    . Tee without cardioversion N/A 06/24/2014    Procedure: TRANSESOPHAGEAL ECHOCARDIOGRAM (TEE);  Surgeon: Sanda Klein, MD;  Location[facility] ENDOSCOPY;  Service: Cardiovascular;  Laterality: N/A;    Family History  Problem Relation Age of Onset  . Cancer Maternal Grandmother     stomach    Allergies  Allergen Reactions  . Lisinopril Anaphylaxis    Tongue swelling    Current Outpatient Prescriptions on File Prior to Visit  Medication Sig Dispense Refill  . amLODipine (NORVASC) 2.5 MG tablet Take 1 tablet (2.5 mg total) by mouth daily. 30 tablet 3  . aspirin 325 MG tablet Take 325 mg by mouth daily.    [name] Kitchen atorvastatin (LIPITOR) 80 MG tablet Take 1 tablet (80 mg total) by mouth daily at 6 PM. 90 tablet 0  . Blood Pressure Monitoring KIT Blood pressure monitor. 1 kit 0  . clopidogrel (PLAVIX) 75 MG tablet Take 1 tablet (75 mg total) by mouth daily. 30 tablet 3  . felodipine (PLENDIL) 10 MG 24 hr tablet Take 1 tablet (10 mg total) by mouth daily. 30 tablet 3  . hydrochlorothiazide (HYDRODIURIL) 25 MG tablet Take 25 mg by mouth daily.     [name] Kitchen levothyroxine (SYNTHROID, LEVOTHROID) 75 MCG tablet TAKE 1 TABLET BY MOUTH EVERY DAY 90 tablet 1  No current facility-administered medications on file prior to visit.    BP 140/68 mmHg  Pulse 82  Temp(Src) 97.5 F (36.4 C) (Oral)  Resp 20  Ht $R'5\' 6"'vt$  (1.676 m)  Wt 151 lb (68.493 kg)  BMI 24.38 kg/m2  SpO2 95%      Review of Systems  HENT: Negative for congestion, dental problem, hearing loss, rhinorrhea, sinus pressure, sore throat and tinnitus.   Eyes: Negative for pain, discharge and visual disturbance.  Respiratory: Negative for cough and shortness of breath.   Cardiovascular: Negative for chest pain, palpitations and leg swelling.  Gastrointestinal: Negative for nausea, vomiting, abdominal pain, diarrhea, constipation, blood in stool and abdominal distention.  Genitourinary: Negative for  dysuria, urgency, frequency, hematuria, flank pain, vaginal bleeding, vaginal discharge, difficulty urinating, vaginal pain and pelvic pain.  Musculoskeletal: Negative for joint swelling, arthralgias and gait problem.  Skin: Negative for rash.  Neurological: Positive for weakness and numbness. Negative for dizziness, syncope, speech difficulty and headaches.  Hematological: Negative for adenopathy.  Psychiatric/Behavioral: Negative for behavioral problems, dysphoric mood and agitation. The patient is not nervous/anxious.        Objective:   Physical Exam  Constitutional: She is oriented to person, place, and time. She appears well-developed and well-nourished.  Blood pressure 160/60 in both arms  HENT:  Head: Normocephalic.  Right Ear: External ear normal.  Left Ear: External ear normal.  Mouth/Throat: Oropharynx is clear and moist.  Eyes: Conjunctivae and EOM are normal. Pupils are equal, round, and reactive to light.  Neck: Normal range of motion. Neck supple. No thyromegaly present.  Cardiovascular: Normal rate, regular rhythm, normal heart sounds and intact distal pulses.   Pulmonary/Chest: Effort normal and breath sounds normal.  Abdominal: Soft. Bowel sounds are normal. She exhibits no mass. There is no tenderness.  Musculoskeletal: Normal range of motion.  Lymphadenopathy:    She has no cervical adenopathy.  Neurological: She is alert and oriented to person, place, and time.  Skin: Skin is warm and dry. No rash noted.  Psychiatric: She has a normal mood and affect. Her behavior is normal.          Assessment & Plan:   Status post left parietal stroke.  Probable recurrent embolic event.  We'll continue Plavix for 90 days.  In addition to aspirin Hypertension.  Low-dose amlodipine added to felodipine for additional blood pressure control.  Has tolerated well without edema at this time  Recheck 4 weeks MRI as scheduled Will report any clinical change

## 2014-10-07 NOTE — Progress Notes (Signed)
Pre visit review using our clinic review tool, if applicable. No additional management support is needed unless otherwise documented below in the visit note. 

## 2014-10-07 NOTE — Patient Instructions (Signed)
Limit your sodium (Salt) intake  Return in one month for follow-up 

## 2014-10-12 ENCOUNTER — Other Ambulatory Visit: Payer: Self-pay | Admitting: Physician Assistant

## 2014-10-14 ENCOUNTER — Ambulatory Visit: Payer: Medicare PPO | Admitting: Internal Medicine

## 2014-10-17 ENCOUNTER — Other Ambulatory Visit: Payer: Self-pay | Admitting: Internal Medicine

## 2014-10-17 ENCOUNTER — Ambulatory Visit
Admission: RE | Admit: 2014-10-17 | Discharge: 2014-10-17 | Disposition: A | Discharge status: Home / Self Care | Payer: Medicare PPO | Source: Ambulatory Visit | Attending: Neurology | Admitting: Neurology

## 2014-10-17 DIAGNOSIS — R29898 Other symptoms and signs involving the musculoskeletal system: Secondary | ICD-10-CM

## 2014-10-17 DIAGNOSIS — M6289 Other specified disorders of muscle: Secondary | ICD-10-CM | POA: Diagnosis not present

## 2014-10-18 ENCOUNTER — Telehealth: Payer: Self-pay | Admitting: Neurology

## 2014-10-18 NOTE — Telephone Encounter (Signed)
I called the patient. The MRI the brain does not show a definite new stroke, there is some question of a mildly diffusion weighted positive lesion in the left middle cerebral distribution, not clear what the significance of this is. The patient has not had any further episodes of right arm clumsiness. She denies any further episodes of heart rhythm issues. If she has any problems in the future, she is to contact our office.   MRI brain 10/17/2014:  IMPRESSION: This is an abnormal MRI of the brain without contrast showing evolution of multiple small embolic strokes in the left MCA distribution first noted on the 06/22/2014 MRI. One focus is still mildly hyperintense on diffusion-weighted images. This could represent the prior small stroke or be due to the interim development of an adjacent small ischemic focus. The left internal carotid artery shows dolichoectasia in the siphon region

## 2014-10-30 ENCOUNTER — Other Ambulatory Visit: Payer: Self-pay | Admitting: Internal Medicine

## 2014-11-03 ENCOUNTER — Telehealth: Payer: Self-pay | Admitting: Family Medicine

## 2014-11-03 NOTE — Telephone Encounter (Signed)
Noted  

## 2014-11-03 NOTE — Telephone Encounter (Signed)
[facility] [facility] - Client [place]  Patient Name: [name]  DOB: 1925-06-22    Initial Comment Caller states c/o extremely weak, no energy, no appetite   Nurse Assessment      Guidelines    Guideline Title Affirmed Question Affirmed Notes  Weakness (Generalized) and Fatigue [1] SEVERE weakness (i.e., unable to walk or barely able to walk, requires support) AND [2] new onset or worsening    Final Disposition User   Call EMS 911 Now [place], Therapist, sports, Amy    Comments  CALLER STATES THAT SHE HAS BEEN FEELING POORLY FOR THE PAST FEW DAYS. SHE IS NOT GETTING ANY STRENGTH BACK. SHE HAS NOT HAD ANY SICKNESS. SHE JUST STARTED OUT WITH NO APPETITE. SHE THOUGHT SHE WAS GETTING WORSE. IT HAS BEEN GOING ON FOR 2-3 DAYS. SHE IS NOT A DIABETIC, NO HEART CONDITIONS. SHE STATES SHE HAS NOT FELT THIS POOR AND NOT THIS LONG. SHE STATES IT HAS BEEN YEARS. SHE DOES NOT HAVE ANYONE THERE WITH HER. SHE STATES THAT SHE FEELS POORLY. SHE IS NOT ABLE TO GET UP AND MOVE AROUND A LOT. SHE HAS NOT BEEN ABLE TO GET UP AND MOVE AROUND A LOT - SHE IS SO WEAK HAVING A HARD TIME MOVING AROUND. SHE STATES THAT ALL OF HER FAMILY IS AT WORK AND SHE IS ASKING IF SHE SHOULD GO TO THE HOSPTIAL. INSTRUCTED HER THAT YES SHE NEEDS TO. SHE IS GOING TO CALL 911. WILL FOLLOW UP WITH HER IN A FEW MINUTES TO MAKE SURE THEY WERE SUCCESSFUL IN GETTING TO HER.

## 2014-11-15 ENCOUNTER — Ambulatory Visit (INDEPENDENT_AMBULATORY_CARE_PROVIDER_SITE_OTHER): Payer: Medicare PPO | Admitting: Internal Medicine

## 2014-11-15 ENCOUNTER — Encounter: Payer: Self-pay | Admitting: Internal Medicine

## 2014-11-15 VITALS — BP 150/70 | HR 83 | Temp 97.4°F | Resp 20 | Ht 66.0 in | Wt 150.0 lb

## 2014-11-15 DIAGNOSIS — R030 Elevated blood-pressure reading, without diagnosis of hypertension: Secondary | ICD-10-CM | POA: Diagnosis not present

## 2014-11-15 DIAGNOSIS — IMO0001 Reserved for inherently not codable concepts without codable children: Secondary | ICD-10-CM

## 2014-11-15 DIAGNOSIS — M150 Primary generalized (osteo)arthritis: Secondary | ICD-10-CM

## 2014-11-15 DIAGNOSIS — M159 Polyosteoarthritis, unspecified: Secondary | ICD-10-CM

## 2014-11-15 DIAGNOSIS — Z8669 Personal history of other diseases of the nervous system and sense organs: Secondary | ICD-10-CM

## 2014-11-15 DIAGNOSIS — Z8673 Personal history of transient ischemic attack (TIA), and cerebral infarction without residual deficits: Secondary | ICD-10-CM

## 2014-11-15 NOTE — Progress Notes (Signed)
Pre visit review using our clinic review tool, if applicable. No additional management support is needed unless otherwise documented below in the visit note. 

## 2014-11-15 NOTE — Patient Instructions (Signed)
Limit your sodium (Salt) intake    It is important that you exercise regularly, at least 20 minutes 3 to 4 times per week.  If you develop chest pain or shortness of breath seek  medical attention.  Discontinue Plavix in one month

## 2014-11-15 NOTE — Progress Notes (Signed)
Subjective:    Patient ID: [name], female    DOB: August 29, 1924, 81 y.o.   MRN: [id number]  HPI  81 year old patient who is seen today for follow-up of hypertension.  She has had a embolic the left parietal stroke in December.  She is on a regimen of aspirin and Plavix but is to discontinue after this month. Is seen today for follow-up of hypertension.  Low-dose amlodipine added to her regimen due to some elevated systolic readings  Past Medical History  Diagnosis Date  . [place] DISEASE, LUMBAR 12/16/2008  . DIVERTICULOSIS, COLON 09/30/2008  . DYSPNEA 07/13/2008  . HYPERLIPIDEMIA 03/06/2007  . HYPERTENSION 03/06/2007  . HYPOTHYROIDISM 10/13/2007  . OSTEOARTHRITIS 03/06/2007  . Personal history of colonic polyps 09/30/2008  . TOBACCO USE, QUIT 04/12/2009  . WEAKNESS 11/09/2007  . Graves disease   . Aortic arch atherosclerosis 06/24/2014  . Atherosclerotic ulcer of aorta 06/24/2014  . History of embolic stroke 4/49/2010    Left brain    History   Social History  . Marital Status: Divorced    Spouse Name: N/A  . Number of Children: 4  . Years of Education: college   Occupational History  . Not on file.   Social History Main Topics  . Smoking status: Former Smoker    Quit date: 07/08/1978  . Smokeless tobacco: Never Used  . Alcohol Use: 2.4 oz/week    4 Glasses of wine per week     Comment: "red wine"  . Drug Use: No  . Sexual Activity: Not on file   Other Topics Concern  . Not on file   Social History Narrative   Patient is right handed.   Patient drinks 1 cup caffeine daily.    Past Surgical History  Procedure Laterality Date  . Cataract extraction    . Knee surgery    . Cholecystectomy    . Cardiac catheterization    . Tee without cardioversion N/A 06/24/2014    Procedure: TRANSESOPHAGEAL ECHOCARDIOGRAM (TEE);  Surgeon: Sanda Klein, MD;  Location: [facility] ENDOSCOPY;  Service: Cardiovascular;  Laterality: N/A;    Family History  Problem Relation Age of Onset  .  Cancer Maternal Grandmother     stomach    Allergies  Allergen Reactions  . Lisinopril Anaphylaxis    Tongue swelling    Current Outpatient Prescriptions on File Prior to Visit  Medication Sig Dispense Refill  . amLODipine (NORVASC) 2.5 MG tablet Take 1 tablet (2.5 mg total) by mouth daily. 30 tablet 3  . aspirin 325 MG tablet Take 325 mg by mouth daily.    [name] Kitchen atorvastatin (LIPITOR) 80 MG tablet TAKE 1 TABLET (80 MG TOTAL) BY MOUTH DAILY AT 6 PM. 90 tablet 1  . Blood Pressure Monitoring KIT Blood pressure monitor. 1 kit 0  . clopidogrel (PLAVIX) 75 MG tablet Take 1 tablet (75 mg total) by mouth daily. 30 tablet 3  . felodipine (PLENDIL) 10 MG 24 hr tablet Take 1 tablet (10 mg total) by mouth daily. 30 tablet 3  . hydrochlorothiazide (HYDRODIURIL) 25 MG tablet TAKE 1 TABLET BY MOUTH EVERY DAY 90 tablet 1  . levothyroxine (SYNTHROID, LEVOTHROID) 75 MCG tablet TAKE 1 TABLET BY MOUTH EVERY DAY 90 tablet 1   No current facility-administered medications on file prior to visit.    BP 150/70 mmHg  Pulse 83  Temp(Src) 97.4 F (36.3 C) (Oral)  Resp 20  Ht $R'5\' 6"'XX$  (1.676 m)  Wt 150 lb (68.04 kg)  BMI 24.22  kg/m2  SpO2 96%      Review of Systems  Constitutional: Negative.   HENT: Negative for congestion, dental problem, hearing loss, rhinorrhea, sinus pressure, sore throat and tinnitus.   Eyes: Negative for pain, discharge and visual disturbance.  Respiratory: Negative for cough and shortness of breath.   Cardiovascular: Negative for chest pain, palpitations and leg swelling.  Gastrointestinal: Negative for nausea, vomiting, abdominal pain, diarrhea, constipation, blood in stool and abdominal distention.  Genitourinary: Negative for dysuria, urgency, frequency, hematuria, flank pain, vaginal bleeding, vaginal discharge, difficulty urinating, vaginal pain and pelvic pain.  Musculoskeletal: Negative for joint swelling, arthralgias and gait problem.  Skin: Negative for rash.    Neurological: Negative for dizziness, syncope, speech difficulty, weakness, numbness and headaches.  Hematological: Negative for adenopathy.  Psychiatric/Behavioral: Negative for behavioral problems, dysphoric mood and agitation. The patient is not nervous/anxious.        Objective:   Physical Exam  Constitutional:  Blood pressure 150/60 in both arms          Assessment & Plan:   Hypertension.  Reasonable control.  Will continue present regimen Cerebrovascular disease.  Discontinue Plavix in one month

## 2014-12-17 ENCOUNTER — Other Ambulatory Visit: Payer: Self-pay | Admitting: Neurology

## 2014-12-27 ENCOUNTER — Other Ambulatory Visit: Payer: Self-pay | Admitting: Internal Medicine

## 2014-12-27 ENCOUNTER — Other Ambulatory Visit: Payer: Self-pay | Admitting: Neurology

## 2015-01-10 ENCOUNTER — Telehealth: Payer: Self-pay | Admitting: Neurology

## 2015-01-10 ENCOUNTER — Other Ambulatory Visit: Payer: Self-pay

## 2015-01-10 DIAGNOSIS — Z1231 Encounter for screening mammogram for malignant neoplasm of breast: Secondary | ICD-10-CM

## 2015-01-10 NOTE — Telephone Encounter (Signed)
I called the patient. I explained to her that Dr. Burnice Logan was the ordering provider and recommended she ask him about altering the dose. She stated that she had been calling his office and cannot get in touch with anyone. She wondered if Dr. Jannifer Franklin could change the dosage so she can avoid having sore muscles. I told her I would check with him and see if he would alter it since she is taking it for the stroke she previously had.

## 2015-01-10 NOTE — Telephone Encounter (Signed)
Patient is calling about her Rx atorvastatin 80 mg which she has been taking for 7 months. She states she is extremely sore and having trouble walking while taking this.  She states she cut her med in half yesterday and she felt so much better.  Can she start taking 40 mg instead of 80 mg.  Thanks!

## 2015-01-10 NOTE — Telephone Encounter (Signed)
I called the patient. The lipitor is causing hip and thigh pain. The patient has not had a recent cholesterol check. She is to cut back to 40 mg a day until she can be seen by her primary MD on 02/15/15. May need to go to [facility] if side effects continue.

## 2015-01-22 ENCOUNTER — Other Ambulatory Visit: Payer: Self-pay | Admitting: Neurology

## 2015-02-10 ENCOUNTER — Encounter: Payer: Self-pay | Admitting: Internal Medicine

## 2015-02-13 ENCOUNTER — Ambulatory Visit
Admission: RE | Admit: 2015-02-13 | Discharge: 2015-02-13 | Disposition: A | Discharge status: Home / Self Care | Payer: Medicare PPO | Source: Ambulatory Visit

## 2015-02-13 DIAGNOSIS — Z1231 Encounter for screening mammogram for malignant neoplasm of breast: Secondary | ICD-10-CM

## 2015-02-15 ENCOUNTER — Encounter: Payer: Self-pay | Admitting: Internal Medicine

## 2015-02-15 ENCOUNTER — Ambulatory Visit (INDEPENDENT_AMBULATORY_CARE_PROVIDER_SITE_OTHER): Payer: Medicare PPO | Admitting: Internal Medicine

## 2015-02-15 VITALS — BP 130/60 | HR 60 | Temp 97.8°F | Wt 146.0 lb

## 2015-02-15 DIAGNOSIS — I10 Essential (primary) hypertension: Secondary | ICD-10-CM

## 2015-02-15 DIAGNOSIS — E039 Hypothyroidism, unspecified: Secondary | ICD-10-CM | POA: Diagnosis not present

## 2015-02-15 DIAGNOSIS — E785 Hyperlipidemia, unspecified: Secondary | ICD-10-CM

## 2015-02-15 DIAGNOSIS — M159 Polyosteoarthritis, unspecified: Secondary | ICD-10-CM

## 2015-02-15 DIAGNOSIS — M150 Primary generalized (osteo)arthritis: Secondary | ICD-10-CM | POA: Diagnosis not present

## 2015-02-15 DIAGNOSIS — Z8669 Personal history of other diseases of the nervous system and sense organs: Secondary | ICD-10-CM

## 2015-02-15 DIAGNOSIS — I639 Cerebral infarction, unspecified: Secondary | ICD-10-CM

## 2015-02-15 DIAGNOSIS — Z8673 Personal history of transient ischemic attack (TIA), and cerebral infarction without residual deficits: Secondary | ICD-10-CM

## 2015-02-15 LAB — LIPID PANEL
CHOLESTEROL: 184 mg/dL (ref 0–200)
HDL: 66.7 mg/dL (ref >39.00)
LDL Cholesterol: 96 mg/dL (ref 0–99)
NONHDL: 117.56
Total CHOL/HDL Ratio: 3
Triglycerides: 109 mg/dL (ref 0.0–149.0)
VLDL: 21.8 mg/dL (ref 0.0–40.0)

## 2015-02-15 LAB — TSH: TSH: 0.99 u[IU]/mL (ref 0.35–4.50)

## 2015-02-15 MED ORDER — ATORVASTATIN CALCIUM 80 MG PO TABS: 40.0000 mg | ORAL_TABLET | Freq: Every day | ORAL | Status: DC

## 2015-02-15 MED ORDER — FELODIPINE ER 10 MG PO TB24: 10.0000 mg | ORAL_TABLET | Freq: Every day | ORAL | Status: DC

## 2015-02-15 MED ORDER — AMLODIPINE BESYLATE 2.5 MG PO TABS: 2.5000 mg | ORAL_TABLET | Freq: Every day | ORAL | Status: DC

## 2015-02-15 NOTE — Progress Notes (Signed)
Subjective:    Patient ID: [name], female    DOB: 1924/09/29, 81 y.o.   MRN: [id number]  HPI  81 year old patient who has a history of hypertension and she will vascular disease and prior embolic stroke.  Presently is on aspirin therapy only.  Plavix has been discontinued.  Low-dose amlodipine has been added to felodipine, with nice blood pressure response The patient is concerned about high-dose statin therapy.  Presently is on atorvastatin 80 mg daily.  He complains of some nonspecific hip stiffness in the morning and some lower extremity weakness. She has some occasional mild left pedal edema. No focal neurological symptoms. In general doing quite well. She has a history of hypothyroidism but no recent TSH.  Past Medical History  Diagnosis Date  . [place] DISEASE, LUMBAR 12/16/2008  . DIVERTICULOSIS, COLON 09/30/2008  . DYSPNEA 07/13/2008  . HYPERLIPIDEMIA 03/06/2007  . HYPERTENSION 03/06/2007  . HYPOTHYROIDISM 10/13/2007  . OSTEOARTHRITIS 03/06/2007  . Personal history of colonic polyps 09/30/2008  . TOBACCO USE, QUIT 04/12/2009  . WEAKNESS 11/09/2007  . Graves disease   . Aortic arch atherosclerosis 06/24/2014  . Atherosclerotic ulcer of aorta 06/24/2014  . History of embolic stroke 6/75/4492    Left brain    Social History   Social History  . Marital Status: Divorced    Spouse Name: N/A  . Number of Children: 4  . Years of Education: college   Occupational History  . Not on file.   Social History Main Topics  . Smoking status: Former Smoker    Quit date: 07/08/1978  . Smokeless tobacco: Never Used  . Alcohol Use: 2.4 oz/week    4 Glasses of wine per week     Comment: "red wine"  . Drug Use: No  . Sexual Activity: Not on file   Other Topics Concern  . Not on file   Social History Narrative   Patient is right handed.   Patient drinks 1 cup caffeine daily.    Past Surgical History  Procedure Laterality Date  . Cataract extraction    . Knee surgery    .  Cholecystectomy    . Cardiac catheterization    . Tee without cardioversion N/A 06/24/2014    Procedure: TRANSESOPHAGEAL ECHOCARDIOGRAM (TEE);  Surgeon: Sanda Klein, MD;  Location: [facility] ENDOSCOPY;  Service: Cardiovascular;  Laterality: N/A;    Family History  Problem Relation Age of Onset  . Cancer Maternal Grandmother     stomach    Allergies  Allergen Reactions  . Lisinopril Anaphylaxis    Tongue swelling    Current Outpatient Prescriptions on File Prior to Visit  Medication Sig Dispense Refill  . aspirin 325 MG tablet Take 325 mg by mouth daily.    . hydrochlorothiazide (HYDRODIURIL) 25 MG tablet TAKE 1 TABLET BY MOUTH EVERY DAY 90 tablet 1  . levothyroxine (SYNTHROID, LEVOTHROID) 75 MCG tablet TAKE 1 TABLET BY MOUTH EVERY DAY 90 tablet 1  . Blood Pressure Monitoring KIT Blood pressure monitor. (Patient not taking: Reported on 02/15/2015) 1 kit 0  . clopidogrel (PLAVIX) 75 MG tablet Take 1 tablet (75 mg total) by mouth daily. (Patient not taking: Reported on 02/15/2015) 30 tablet 3   No current facility-administered medications on file prior to visit.    BP 130/60 mmHg  Pulse 60  Temp(Src) 97.8 F (36.6 C) (Oral)  Wt 146 lb (66.225 kg)     Review of Systems  Constitutional: Negative.   HENT: Negative for congestion, dental problem, hearing  loss, rhinorrhea, sinus pressure, sore throat and tinnitus.   Eyes: Negative for pain, discharge and visual disturbance.  Respiratory: Negative for cough and shortness of breath.   Cardiovascular: Negative for chest pain, palpitations and leg swelling.  Gastrointestinal: Negative for nausea, vomiting, abdominal pain, diarrhea, constipation, blood in stool and abdominal distention.  Genitourinary: Negative for dysuria, urgency, frequency, hematuria, flank pain, vaginal bleeding, vaginal discharge, difficulty urinating, vaginal pain and pelvic pain.  Musculoskeletal: Positive for myalgias, arthralgias and neck stiffness. Negative for  joint swelling and gait problem.  Skin: Negative for rash.  Neurological: Negative for dizziness, syncope, speech difficulty, weakness, numbness and headaches.  Hematological: Negative for adenopathy.  Psychiatric/Behavioral: Negative for behavioral problems, dysphoric mood and agitation. The patient is not nervous/anxious.        Objective:   Physical Exam  Constitutional: She is oriented to person, place, and time. She appears well-developed and well-nourished.  Repeat blood pressure still 1:30 over 60  HENT:  Head: Normocephalic.  Right Ear: External ear normal.  Left Ear: External ear normal.  Mouth/Throat: Oropharynx is clear and moist.  Eyes: Conjunctivae and EOM are normal. Pupils are equal, round, and reactive to light.  Neck: Normal range of motion. Neck supple. No thyromegaly present.  Cardiovascular: Normal rate, regular rhythm, normal heart sounds and intact distal pulses.   Pulmonary/Chest: Effort normal and breath sounds normal.  Abdominal: Soft. Bowel sounds are normal. She exhibits no mass. There is no tenderness.  Musculoskeletal: Normal range of motion.  Trace pedal edema  Lymphadenopathy:    She has no cervical adenopathy.  Neurological: She is alert and oriented to person, place, and time.  Skin: Skin is warm and dry. No rash noted.  Psychiatric: She has a normal mood and affect. Her behavior is normal.          Assessment & Plan:   Hypertension, well-controlled.  No change in medical regimen, although 2 medications from the same class a bit unconventional Cerebrovascular disease with prior history of embolic stroke.  Continue aggressive risk factor modification.  Patient quite concerned about high intensity statin therapy.  Symptoms are nonspecific.  We'll challenge at a 40 mg daily dose.  Recheck 3 months Hypothyroidism.  We'll check a TSH

## 2015-02-15 NOTE — Progress Notes (Signed)
Pre visit review using our clinic review tool, if applicable. No additional management support is needed unless otherwise documented below in the visit note. 

## 2015-02-15 NOTE — Patient Instructions (Signed)
Limit your sodium (Salt) intake  Please check your blood pressure on a regular basis.  If it is consistently greater than 150/90, please make an office appointment.  Return in 4 months for follow-up  

## 2015-02-21 ENCOUNTER — Telehealth: Payer: Self-pay | Admitting: Internal Medicine

## 2015-02-21 NOTE — Telephone Encounter (Signed)
Pt needs blood work result

## 2015-02-21 NOTE — Telephone Encounter (Signed)
Spoke to pt, told her labs were normal. Pt verbalized understanding.

## 2015-03-15 ENCOUNTER — Other Ambulatory Visit: Payer: Self-pay | Admitting: Physician Assistant

## 2015-03-15 DIAGNOSIS — D2272 Melanocytic nevi of left lower limb, including hip: Secondary | ICD-10-CM | POA: Diagnosis not present

## 2015-03-15 DIAGNOSIS — D485 Neoplasm of uncertain behavior of skin: Secondary | ICD-10-CM | POA: Diagnosis not present

## 2015-04-05 ENCOUNTER — Encounter: Payer: Self-pay | Admitting: Neurology

## 2015-04-05 ENCOUNTER — Ambulatory Visit (INDEPENDENT_AMBULATORY_CARE_PROVIDER_SITE_OTHER): Payer: Medicare PPO | Admitting: Neurology

## 2015-04-05 VITALS — BP 143/73 | HR 83 | Ht 66.0 in | Wt 145.5 lb

## 2015-04-05 DIAGNOSIS — Z8669 Personal history of other diseases of the nervous system and sense organs: Secondary | ICD-10-CM | POA: Diagnosis not present

## 2015-04-05 DIAGNOSIS — I700 Atherosclerosis of aorta: Secondary | ICD-10-CM | POA: Diagnosis not present

## 2015-04-05 DIAGNOSIS — Z8673 Personal history of transient ischemic attack (TIA), and cerebral infarction without residual deficits: Secondary | ICD-10-CM

## 2015-04-05 NOTE — Progress Notes (Signed)
Reason for visit: Stroke follow-up  [name] is an 81 y.o. female  History of present illness:  [name] is an 81 year old right-handed white female with a history of a left middle cerebral artery distribution stroke felt secondary to emboli from the aortic arch. The patient has noted some mild residual in the right arm and hand, the handwriting has changed slightly. Otherwise, the patient is back to performing all activities of daily living. She denies any balance issues, speech issues, vision changes, or headache. The patient has been on aspirin and Plavix initially, she is now on aspirin only. She is still having some mild elevation in systolic blood pressures, she is on Lipitor for cholesterol. She did have some neuromuscular side effects on this dose, but she is now tolerating this medication better. She has a sensation of slight weakness when she walks long distances. She returns for an evaluation. She was having some transient episodes of right hand clumsiness, this has resolved.  Past Medical History  Diagnosis Date  . [place] DISEASE, LUMBAR 12/16/2008  . DIVERTICULOSIS, COLON 09/30/2008  . DYSPNEA 07/13/2008  . HYPERLIPIDEMIA 03/06/2007  . HYPERTENSION 03/06/2007  . HYPOTHYROIDISM 10/13/2007  . OSTEOARTHRITIS 03/06/2007  . Personal history of colonic polyps 09/30/2008  . TOBACCO USE, QUIT 04/12/2009  . WEAKNESS 11/09/2007  . Graves disease   . Aortic arch atherosclerosis 06/24/2014  . Atherosclerotic ulcer of aorta 06/24/2014  . History of embolic stroke 0/53/9767    Left brain    Past Surgical History  Procedure Laterality Date  . Cataract extraction    . Knee surgery    . Cholecystectomy    . Cardiac catheterization    . Tee without cardioversion N/A 06/24/2014    Procedure: TRANSESOPHAGEAL ECHOCARDIOGRAM (TEE);  Surgeon: Sanda Klein, MD;  Location: [facility] ENDOSCOPY;  Service: Cardiovascular;  Laterality: N/A;    Family History  Problem Relation Age of Onset  . Cancer  Maternal Grandmother     stomach    Social history:  reports that she quit smoking about 36 years ago. She has never used smokeless tobacco. She reports that she drinks about 4.2 oz of alcohol per week. She reports that she does not use illicit drugs.    Allergies  Allergen Reactions  . Lisinopril Anaphylaxis    Tongue swelling    Medications:  Prior to Admission medications   Medication Sig Start Date End Date Taking? Authorizing Provider  amLODipine (NORVASC) 2.5 MG tablet Take 1 tablet (2.5 mg total) by mouth daily. 02/15/15  Yes Marletta Lor, MD  aspirin 325 MG tablet Take 325 mg by mouth daily.   Yes Historical Provider, MD  atorvastatin (LIPITOR) 80 MG tablet Take 0.5 tablets (40 mg total) by mouth daily at 6 PM. 02/15/15  Yes Marletta Lor, MD  felodipine (PLENDIL) 10 MG 24 hr tablet Take 1 tablet (10 mg total) by mouth daily. 02/15/15  Yes Marletta Lor, MD  hydrochlorothiazide (HYDRODIURIL) 25 MG tablet TAKE 1 TABLET BY MOUTH EVERY DAY 12/27/14  Yes Marletta Lor, MD  levothyroxine (SYNTHROID, LEVOTHROID) 75 MCG tablet TAKE 1 TABLET BY MOUTH EVERY DAY 12/27/14  Yes Marletta Lor, MD    ROS:  Out of a complete 14 system review of symptoms, the patient complains only of the following symptoms, and all other reviewed systems are negative.  Mild muscular pain  Blood pressure 143/73, pulse 83, height 5\' 6"  (1.676 m), weight 145 lb 8 oz (65.998 kg).  Physical  Exam  General: The patient is alert and cooperative at the time of the examination.  Skin: No significant peripheral edema is noted.   Neurologic Exam  Mental status: The patient is alert and oriented x 3 at the time of the examination. The patient has apparent normal recent and remote memory, with an apparently normal attention span and concentration ability.   Cranial nerves: Facial symmetry is present. Speech is normal, no aphasia or dysarthria is noted. Extraocular movements are full.  Visual fields are full.  Motor: The patient has good strength in all 4 extremities.  Sensory examination: Soft touch sensation is symmetric on the face, arms, and legs.  Coordination: The patient has good finger-nose-finger and heel-to-shin bilaterally.  Gait and station: The patient has a normal gait. Tandem gait is very minimally unsteady. Romberg is negative. No drift is seen.  Reflexes: Deep tendon reflexes are symmetric.   Assessment/Plan:  1. Left brain stroke  2. Hypertension  3. Dyslipidemia  The patient is doing quite well at this time. She will continue the aspirin therapy, follow-up with her primary care physician for the cholesterol and blood pressure issues. If the neuromuscular discomfort comes back on, a possible switch to Crestor may be of benefit, as this medication may have fewer neuromuscular side effects. The patient will follow-up through this office on an as-needed basis.  Jill Alexanders MD 04/05/2015 7:37 PM  [facility] [address] [place], [place] [address]  Phone [phone] Fax [phone]

## 2015-04-05 NOTE — Patient Instructions (Signed)
Stroke Prevention Some medical conditions and behaviors are associated with an increased chance of having a stroke. You may prevent a stroke by making healthy choices and managing medical conditions. HOW CAN I REDUCE MY RISK OF HAVING A STROKE?   Stay physically active. Get at least 30 minutes of activity on most or all days.  Do not smoke. It may also be helpful to avoid exposure to secondhand smoke.  Limit alcohol use. Moderate alcohol use is considered to be:  No more than 2 drinks per day for men.  No more than 1 drink per day for nonpregnant women.  Eat healthy foods. This involves:  Eating 5 or more servings of fruits and vegetables a day.  Making dietary changes that address high blood pressure (hypertension), high cholesterol, diabetes, or obesity.  Manage your cholesterol levels.  Making food choices that are high in fiber and low in saturated fat, trans fat, and cholesterol may control cholesterol levels.  Take any prescribed medicines to control cholesterol as directed by your health care [name].  Manage your diabetes.  Controlling your carbohydrate and sugar intake is recommended to manage diabetes.  Take any prescribed medicines to control diabetes as directed by your health care [name].  Control your hypertension.  Making food choices that are low in salt (sodium), saturated fat, trans fat, and cholesterol is recommended to manage hypertension.  Take any prescribed medicines to control hypertension as directed by your health care [name].  Maintain a healthy weight.  Reducing calorie intake and making food choices that are low in sodium, saturated fat, trans fat, and cholesterol are recommended to manage weight.  Stop drug abuse.  Avoid taking birth control pills.  Talk to your health care [name] about the risks of taking birth control pills if you are over 35 years old, smoke, get migraines, or have ever had a blood clot.  Get evaluated for sleep  disorders (sleep apnea).  Talk to your health care [name] about getting a sleep evaluation if you snore a lot or have excessive sleepiness.  Take medicines only as directed by your health care [name].  For some people, aspirin or blood thinners (anticoagulants) are helpful in reducing the risk of forming abnormal blood clots that can lead to stroke. If you have the irregular heart rhythm of atrial fibrillation, you should be on a blood thinner unless there is a good reason you cannot take them.  Understand all your medicine instructions.  Make sure that other conditions (such as anemia or atherosclerosis) are addressed. SEEK IMMEDIATE MEDICAL CARE IF:   You have sudden weakness or numbness of the face, arm, or leg, especially on one side of the body.  Your face or eyelid droops to one side.  You have sudden confusion.  You have trouble speaking (aphasia) or understanding.  You have sudden trouble seeing in one or both eyes.  You have sudden trouble walking.  You have dizziness.  You have a loss of balance or coordination.  You have a sudden, severe headache with no known cause.  You have new chest pain or an irregular heartbeat. Any of these symptoms may represent a serious problem that is an emergency. Do not wait to see if the symptoms will go away. Get medical help at once. Call your local emergency services (911 in [place] Do not drive yourself to the hospital. Document Released: 08/01/2004 Document Revised: 11/08/2013 Document Reviewed: 12/25/2012 Exit[employer] Patient Information 2015 [employer]. This information is not intended to replace advice given   to you by your health care [name]. Make sure you discuss any questions you have with your health care [name].  

## 2015-04-25 ENCOUNTER — Ambulatory Visit (INDEPENDENT_AMBULATORY_CARE_PROVIDER_SITE_OTHER): Payer: Medicare PPO | Admitting: *Deleted

## 2015-04-25 DIAGNOSIS — Z23 Encounter for immunization: Secondary | ICD-10-CM | POA: Diagnosis not present

## 2015-04-30 ENCOUNTER — Other Ambulatory Visit: Payer: Self-pay | Admitting: Internal Medicine

## 2015-05-06 ENCOUNTER — Other Ambulatory Visit: Payer: Self-pay | Admitting: Internal Medicine

## 2015-06-16 ENCOUNTER — Encounter: Payer: Self-pay | Admitting: Internal Medicine

## 2015-06-16 ENCOUNTER — Ambulatory Visit (INDEPENDENT_AMBULATORY_CARE_PROVIDER_SITE_OTHER): Payer: Medicare PPO | Admitting: Internal Medicine

## 2015-06-16 VITALS — BP 140/68 | HR 80 | Temp 97.6°F | Ht 66.0 in | Wt 147.0 lb

## 2015-06-16 DIAGNOSIS — Z8669 Personal history of other diseases of the nervous system and sense organs: Secondary | ICD-10-CM

## 2015-06-16 DIAGNOSIS — I10 Essential (primary) hypertension: Secondary | ICD-10-CM

## 2015-06-16 DIAGNOSIS — Z8673 Personal history of transient ischemic attack (TIA), and cerebral infarction without residual deficits: Secondary | ICD-10-CM

## 2015-06-16 DIAGNOSIS — I700 Atherosclerosis of aorta: Secondary | ICD-10-CM

## 2015-06-16 NOTE — Patient Instructions (Signed)
Limit your sodium (Salt) intake  Return in 6 months for follow-up  

## 2015-06-16 NOTE — Progress Notes (Signed)
Subjective:    Patient ID: [name], female    DOB: 1925-06-14, 81 y.o.   MRN: [id number]  HPI  81 year old patient who is seen today for her biannual follow-up.  She has essential hypertension and mild dyslipidemia.  She continues to do well.  She has a history of a embolic stroke, probably from an atherosclerotic ulcer of the aortic arch.  She has been released by neurology.  Remains on daily aspirin.  No focal neurological symptoms  Continues to do well  Past Medical History  Diagnosis Date  . [place] DISEASE, LUMBAR 12/16/2008  . DIVERTICULOSIS, COLON 09/30/2008  . DYSPNEA 07/13/2008  . HYPERLIPIDEMIA 03/06/2007  . HYPERTENSION 03/06/2007  . HYPOTHYROIDISM 10/13/2007  . OSTEOARTHRITIS 03/06/2007  . Personal history of colonic polyps 09/30/2008  . TOBACCO USE, QUIT 04/12/2009  . WEAKNESS 11/09/2007  . Graves disease   . Aortic arch atherosclerosis ([place]) 06/24/2014  . Atherosclerotic ulcer of aorta ([place]) 06/24/2014  . History of embolic stroke [id number]    Left brain    Social History   Social History  . Marital Status: Divorced    Spouse Name: N/A  . Number of Children: 4  . Years of Education: college   Occupational History  . Not on file.   Social History Main Topics  . Smoking status: Former Smoker    Quit date: 07/08/1978  . Smokeless tobacco: Never Used  . Alcohol Use: 4.2 oz/week    7 Glasses of wine per week     Comment: "red wine"  . Drug Use: No  . Sexual Activity: Not on file   Other Topics Concern  . Not on file   Social History Narrative   Patient is right handed.   Patient drinks 1 cup caffeine daily.    Past Surgical History  Procedure Laterality Date  . Cataract extraction    . Knee surgery    . Cholecystectomy    . Cardiac catheterization    . Tee without cardioversion N/A 06/24/2014    Procedure: TRANSESOPHAGEAL ECHOCARDIOGRAM (TEE);  Surgeon: Sanda Klein, MD;  Location: [facility] ENDOSCOPY;  Service: Cardiovascular;  Laterality: N/A;     Family History  Problem Relation Age of Onset  . Cancer Maternal Grandmother     stomach    Allergies  Allergen Reactions  . Lisinopril Anaphylaxis    Tongue swelling    Current Outpatient Prescriptions on File Prior to Visit  Medication Sig Dispense Refill  . amLODipine (NORVASC) 2.5 MG tablet Take 1 tablet (2.5 mg total) by mouth daily. 90 tablet 3  . aspirin 325 MG tablet Take 325 mg by mouth daily.    [name] Kitchen atorvastatin (LIPITOR) 80 MG tablet TAKE 1 TABLET BY MOUTH EVERY DAY AT 6PM 90 tablet 1  . felodipine (PLENDIL) 10 MG 24 hr tablet Take 1 tablet (10 mg total) by mouth daily. 90 tablet 3  . hydrochlorothiazide (HYDRODIURIL) 25 MG tablet TAKE 1 TABLET BY MOUTH EVERY DAY 90 tablet 1  . levothyroxine (SYNTHROID, LEVOTHROID) 75 MCG tablet TAKE 1 TABLET BY MOUTH EVERY DAY 90 tablet 1   No current facility-administered medications on file prior to visit.    BP 140/68 mmHg  Pulse 80  Temp(Src) 97.6 F (36.4 C) (Oral)  Ht 5\' 6"  (1.676 m)  Wt 147 lb (66.679 kg)  BMI 23.74 kg/m2  SpO2 98%     Review of Systems  Constitutional: Negative.   HENT: Negative for congestion, dental problem, hearing loss, rhinorrhea, sinus pressure, sore throat  and tinnitus.   Eyes: Negative for pain, discharge and visual disturbance.  Respiratory: Negative for cough and shortness of breath.   Cardiovascular: Negative for chest pain, palpitations and leg swelling.  Gastrointestinal: Negative for nausea, vomiting, abdominal pain, diarrhea, constipation, blood in stool and abdominal distention.  Genitourinary: Negative for dysuria, urgency, frequency, hematuria, flank pain, vaginal bleeding, vaginal discharge, difficulty urinating, vaginal pain and pelvic pain.  Musculoskeletal: Negative for joint swelling, arthralgias and gait problem.  Skin: Negative for rash.  Neurological: Negative for dizziness, syncope, speech difficulty, weakness, numbness and headaches.  Hematological: Negative for  adenopathy.  Psychiatric/Behavioral: Negative for behavioral problems, dysphoric mood and agitation. The patient is not nervous/anxious.        Objective:   Physical Exam  Constitutional: She is oriented to person, place, and time. She appears well-developed and well-nourished.  Blood pressure 130/70  HENT:  Head: Normocephalic.  Right Ear: External ear normal.  Left Ear: External ear normal.  Mouth/Throat: Oropharynx is clear and moist.  Eyes: Conjunctivae and EOM are normal. Pupils are equal, round, and reactive to light.  Neck: Normal range of motion. Neck supple. No thyromegaly present.  Cardiovascular: Normal rate, regular rhythm, normal heart sounds and intact distal pulses.   Pulmonary/Chest: Effort normal and breath sounds normal.  Abdominal: Soft. Bowel sounds are normal. She exhibits no mass. There is no tenderness.  Musculoskeletal: Normal range of motion.  Lymphadenopathy:    She has no cervical adenopathy.  Neurological: She is alert and oriented to person, place, and time.  Skin: Skin is warm and dry. No rash noted.  Psychiatric: She has a normal mood and affect. Her behavior is normal.          Assessment & Plan:   Status post embolic stroke, stable Essential hypertension, stable Dyslipidemia.  Continue statin therapy  CPX 6 months

## 2015-06-16 NOTE — Progress Notes (Signed)
Pre visit review using our clinic review tool, if applicable. No additional management support is needed unless otherwise documented below in the visit note. 

## 2015-08-24 ENCOUNTER — Telehealth: Payer: Self-pay | Admitting: Neurology

## 2015-08-24 DIAGNOSIS — I6522 Occlusion and stenosis of left carotid artery: Secondary | ICD-10-CM

## 2015-08-24 NOTE — Telephone Encounter (Signed)
I called the patient regarding setting up another follow-up carotid Doppler study. Previously, there was stenosis in the 60-79% range with the origin of the left carotid artery. I discussed this with the patient, she is amenable to having a follow-up study. I will get this set up.

## 2015-08-29 ENCOUNTER — Other Ambulatory Visit: Payer: Self-pay | Admitting: Physician Assistant

## 2015-09-06 ENCOUNTER — Ambulatory Visit (INDEPENDENT_AMBULATORY_CARE_PROVIDER_SITE_OTHER): Payer: Medicare Other

## 2015-09-06 ENCOUNTER — Telehealth: Payer: Self-pay | Admitting: Neurology

## 2015-09-06 DIAGNOSIS — Z5181 Encounter for therapeutic drug level monitoring: Secondary | ICD-10-CM

## 2015-09-06 DIAGNOSIS — I6522 Occlusion and stenosis of left carotid artery: Secondary | ICD-10-CM

## 2015-09-06 NOTE — Telephone Encounter (Signed)
The results of the carotid Doppler studies show greater than 80%, near occlusion of the left internal carotid and external carotid arteries. On the right side, this stenosis is in the 50-69% range. There is mixed plaque, hard and soft. This represents a progression from the prior study.   I called patient. Carotid Doppler study suggests progression of stenosis on the left and right internal carotid arteries. This suggests that there is a greater then 80% blockage on the left with near occlusion according to the report. The right has progressed to 50-69% stenosis. Given the fairly significant changes from the last years Doppler on both sides, I will confirm the stenosis with a CT angiogram, the patient will come in for blood work to check the renal function before the study.

## 2015-09-13 ENCOUNTER — Other Ambulatory Visit (INDEPENDENT_AMBULATORY_CARE_PROVIDER_SITE_OTHER): Payer: Self-pay

## 2015-09-13 DIAGNOSIS — Z5181 Encounter for therapeutic drug level monitoring: Secondary | ICD-10-CM

## 2015-09-13 DIAGNOSIS — Z0289 Encounter for other administrative examinations: Secondary | ICD-10-CM

## 2015-09-14 ENCOUNTER — Telehealth: Payer: Self-pay | Admitting: Neurology

## 2015-09-14 DIAGNOSIS — I6522 Occlusion and stenosis of left carotid artery: Secondary | ICD-10-CM

## 2015-09-14 LAB — BASIC METABOLIC PANEL
BUN / CREAT RATIO: 15 (ref 11–26)
BUN: 23 mg/dL (ref 8–27)
CO2: 28 mmol/L (ref 18–29)
CREATININE: 1.52 mg/dL — AB (ref 0.57–1.00)
Calcium: 9.4 mg/dL (ref 8.7–10.3)
Chloride: 91 mmol/L — ABNORMAL LOW (ref 96–106)
GFR calc non Af Amer: 30 mL/min/{1.73_m2} — ABNORMAL LOW (ref >59)
GFR, EST AFRICAN AMERICAN: 35 mL/min/{1.73_m2} — AB (ref >59)
Glucose: 143 mg/dL — ABNORMAL HIGH (ref 65–99)
Potassium: 3.6 mmol/L (ref 3.5–5.2)
Sodium: 138 mmol/L (ref 134–144)

## 2015-09-14 NOTE — Telephone Encounter (Signed)
I called the patient. The renal function has declined. GFR is 30. Cannot do CTA or MRA of the neck to confirm doppler findings. I will refer to vascular surgery for an opinion.

## 2015-09-20 DIAGNOSIS — M150 Primary generalized (osteo)arthritis: Secondary | ICD-10-CM | POA: Diagnosis not present

## 2015-09-20 DIAGNOSIS — Z48812 Encounter for surgical aftercare following surgery on the circulatory system: Secondary | ICD-10-CM | POA: Diagnosis not present

## 2015-09-20 DIAGNOSIS — M5136 Other intervertebral disc degeneration, lumbar region: Secondary | ICD-10-CM | POA: Diagnosis not present

## 2015-09-20 DIAGNOSIS — K5790 Diverticulosis of intestine, part unspecified, without perforation or abscess without bleeding: Secondary | ICD-10-CM | POA: Diagnosis not present

## 2015-09-28 ENCOUNTER — Other Ambulatory Visit: Payer: Self-pay | Admitting: Physician Assistant

## 2015-09-29 ENCOUNTER — Ambulatory Visit (HOSPITAL_COMMUNITY)
Admission: RE | Admit: 2015-09-29 | Discharge: 2015-09-29 | Disposition: A | Discharge status: Home / Self Care | Payer: Medicare Other | Source: Ambulatory Visit | Attending: Vascular Surgery | Admitting: Vascular Surgery

## 2015-09-29 ENCOUNTER — Telehealth: Payer: Self-pay | Admitting: Vascular Surgery

## 2015-09-29 ENCOUNTER — Ambulatory Visit (INDEPENDENT_AMBULATORY_CARE_PROVIDER_SITE_OTHER): Payer: Medicare Other | Admitting: Vascular Surgery

## 2015-09-29 ENCOUNTER — Other Ambulatory Visit: Payer: Self-pay | Admitting: *Deleted

## 2015-09-29 ENCOUNTER — Encounter: Payer: Self-pay | Admitting: Vascular Surgery

## 2015-09-29 ENCOUNTER — Other Ambulatory Visit: Payer: Self-pay

## 2015-09-29 VITALS — BP 140/77 | HR 89 | Ht 66.0 in | Wt 149.1 lb

## 2015-09-29 DIAGNOSIS — I6523 Occlusion and stenosis of bilateral carotid arteries: Secondary | ICD-10-CM

## 2015-09-29 DIAGNOSIS — I6522 Occlusion and stenosis of left carotid artery: Secondary | ICD-10-CM

## 2015-09-29 DIAGNOSIS — I779 Disorder of arteries and arterioles, unspecified: Secondary | ICD-10-CM | POA: Diagnosis not present

## 2015-09-29 DIAGNOSIS — I739 Peripheral vascular disease, unspecified: Secondary | ICD-10-CM

## 2015-09-29 NOTE — Telephone Encounter (Signed)
notified patient of appointment with dr. Kristopher Oppenheim on 10-23-15 at 10:15 am

## 2015-09-29 NOTE — Progress Notes (Signed)
New Carotid Patient  Referred by:  Marletta Lor, MD [place], [place] [id number]  Reason for referral: L ICA high grade stenosis  History of Present Illness  [name] is a 82 y.o. (Jul 05, 1925) female who presents with chief complaint: tight artery.  This patient previous had a L parietal stroke in Dec 2015.  It was felt based on her work-up that she had embolic phenomena from her aortic arch.  Reported outside carotid studies demonstrated a left high grade stenosis >80%.  Patient has known history of TIA: right hand weakness and paraesthesia.  The patient has never had amaurosis fugax or monocular blindness.  The patient has never had facial drooping or hemiplegia.  The patient has never had receptive or expressive aphasia.   he patient's previous neurologic deficits have resolved.  The patient's risks factors for carotid disease include: HLD, HTN and prior smoker.  Past Medical History  Diagnosis Date  . [place] DISEASE, LUMBAR 12/16/2008  . DIVERTICULOSIS, COLON 09/30/2008  . DYSPNEA 07/13/2008  . HYPERLIPIDEMIA 03/06/2007  . HYPERTENSION 03/06/2007  . HYPOTHYROIDISM 10/13/2007  . OSTEOARTHRITIS 03/06/2007  . Personal history of colonic polyps 09/30/2008  . TOBACCO USE, QUIT 04/12/2009  . WEAKNESS 11/09/2007  . Graves disease   . Aortic arch atherosclerosis ([place]) 06/24/2014  . Atherosclerotic ulcer of aorta ([place]) 06/24/2014  . History of embolic stroke [id number]    Left brain    Past Surgical History  Procedure Laterality Date  . Cataract extraction    . Knee surgery    . Cholecystectomy    . Cardiac catheterization    . Tee without cardioversion N/A 06/24/2014    Procedure: TRANSESOPHAGEAL ECHOCARDIOGRAM (TEE);  Surgeon: Sanda Klein, MD;  Location: [facility] ENDOSCOPY;  Service: Cardiovascular;  Laterality: N/A;    Social History   Social History  . Marital Status: Divorced    Spouse Name: N/A  . Number of Children: 4  . Years of Education: college    Occupational History  . Not on file.   Social History Main Topics  . Smoking status: Former Smoker    Quit date: 07/08/1978  . Smokeless tobacco: Never Used  . Alcohol Use: 4.2 oz/week    7 Glasses of wine per week     Comment: "red wine"  . Drug Use: No  . Sexual Activity: Not on file   Other Topics Concern  . Not on file   Social History Narrative   Patient is right handed.   Patient drinks 1 cup caffeine daily.    Family History  Problem Relation Age of Onset  . Cancer Maternal Grandmother     stomach    Current Outpatient Prescriptions  Medication Sig Dispense Refill  . amLODipine (NORVASC) 2.5 MG tablet Take 1 tablet (2.5 mg total) by mouth daily. 90 tablet 3  . aspirin 325 MG tablet Take 325 mg by mouth daily.    [name] Kitchen atorvastatin (LIPITOR) 80 MG tablet TAKE 1 TABLET BY MOUTH EVERY DAY AT 6PM 90 tablet 1  . felodipine (PLENDIL) 10 MG 24 hr tablet Take 1 tablet (10 mg total) by mouth daily. 90 tablet 3  . hydrochlorothiazide (HYDRODIURIL) 25 MG tablet TAKE 1 TABLET BY MOUTH EVERY DAY 90 tablet 1  . levothyroxine (SYNTHROID, LEVOTHROID) 75 MCG tablet TAKE 1 TABLET BY MOUTH EVERY DAY 90 tablet 1   No current facility-administered medications for this visit.    Allergies  Allergen Reactions  . Lisinopril Anaphylaxis  Tongue swelling     REVIEW OF SYSTEMS:  (Positives checked otherwise negative)  CARDIOVASCULAR:   [ ]  chest pain,  [ ]  chest pressure,  [ ]  palpitations,  [ ]  shortness of breath when laying flat,  [ ]  shortness of breath with exertion,   [ ]  pain in feet when walking,  [ ]  pain in feet when laying flat, [ ]  history of blood clot in veins (DVT),  [ ]  history of phlebitis,  [ ]  swelling in legs,  [ ]  varicose veins  PULMONARY:   [ ]  productive cough,  [ ]  asthma,  [ ]  wheezing  NEUROLOGIC:   [ ]  weakness in arms or legs,  [ ]  numbness in arms or legs,  [ ]  difficulty speaking or slurred speech,  [ ]  temporary loss of vision in one  eye,  [ ]  dizziness  HEMATOLOGIC:   [ ]  bleeding problems,  [ ]  problems with blood clotting too easily  MUSCULOSKEL:   [x]  joint pain, [ ]  joint swelling  GASTROINTEST:   [ ]  vomiting blood,  [ ]  blood in stool     GENITOURINARY:   [ ]  burning with urination,  [ ]  blood in urine  PSYCHIATRIC:   [ ]  history of major depression  INTEGUMENTARY:   [ ]  rashes,  [ ]  ulcers  CONSTITUTIONAL:   [ ]  fever,  [ ]  chills   For VQI Use Only  PRE-ADM LIVING: Home  AMB STATUS: Ambulatory  CAD Sx: None  PRIOR CHF: None  STRESS TEST: [x]  No, [ ]  Normal, [ ]  + ischemia, [ ]  + MI, [ ]  Both   Physical Examination  Filed Vitals:   09/29/15 0926 09/29/15 0928  BP: 147/78 140/77  Pulse: 89   Height: 5\' 6"  (1.676 m)   Weight: 149 lb 1.6 oz (67.631 kg)   SpO2: 95%    Body mass index is 24.08 kg/(m^2).  General: A&O x 3, WDWN, appears younger than stated age  Head: [place]/AT  Ear/Nose/Throat: Hearing grossly intact, nares w/o erythema or drainage, oropharynx w/o Erythema/Exudate, Mallampati score: 3  Eyes: PERRLA, EOMI  Neck: Supple, no nuchal rigidity, no palpable LAD, on Sonosite evaluation: L ICA stenosis appears high  Pulmonary: Sym exp, good air movt, CTAB, no rales, rhonchi, & wheezing  Cardiac: RRR, Nl S1, S2, no Murmurs, rubs or gallops  Vascular: Vessel Right Left  Radial Palpable Palpable  Brachial Palpable Palpable  Carotid Palpable, without bruit Palpable, without bruit  Aorta Not palpable N/A  Femoral Palpable Palpable  Popliteal Not palpable Not palpable  PT Not Palpable Not Palpable  DP Not Palpable Not Palpable   Gastrointestinal: soft, NTND, -G/R, - HSM, - masses, - CVAT B  Musculoskeletal: M/S 5/5 throughout , Extremities without ischemic changes   Neurologic: CN 2-12 intact , Pain and light touch intact in extremities , Motor exam as listed above  Psychiatric: Judgment intact, Mood & affect appropriate for pt's clinical  situation  Dermatologic: See M/S exam for extremity exam, no rashes otherwise noted  Lymph : No Cervical, Axillary, or Inguinal lymphadenopathy   Laboratory: CBC:    Component Value Date/Time   WBC 4.8 06/23/2014 0639   RBC 4.18 06/23/2014 0639   HGB 12.4 06/23/2014 0639   HCT 37.5 06/23/2014 0639   PLT 215 06/23/2014 0639   MCV 89.7 06/23/2014 0639   MCH 29.7 06/23/2014 0639   MCHC 33.1 06/23/2014 0639   RDW 13.0 06/23/2014 0639   LYMPHSABS 1.6  06/22/2014 0810   MONOABS 0.5 06/22/2014 0810   EOSABS 0.2 06/22/2014 0810   BASOSABS 0.0 06/22/2014 0810    BMP:    Component Value Date/Time   NA 138 09/13/2015 1103   NA 138 06/23/2014 0639   K 3.6 09/13/2015 1103   CL 91* 09/13/2015 1103   CO2 28 09/13/2015 1103   GLUCOSE 143* 09/13/2015 1103   GLUCOSE 107* 06/23/2014 0639   BUN 23 09/13/2015 1103   BUN 19 06/23/2014 0639   CREATININE 1.52* 09/13/2015 1103   CALCIUM 9.4 09/13/2015 1103   GFRNONAA 30* 09/13/2015 1103   GFRAA 35* 09/13/2015 1103    Coagulation: No results found for: INR, PROTIME No results found for: PTT  Lipids:    Component Value Date/Time   CHOL 184 02/15/2015 1122   TRIG 109.0 02/15/2015 1122   HDL 66.70 02/15/2015 1122   CHOLHDL 3 02/15/2015 1122   VLDL 21.8 02/15/2015 1122   LDLCALC 96 02/15/2015 1122   LDLDIRECT 141.7 05/01/2011 1037      Non-Invasive Vascular Imaging  L CAROTID DUPLEX (Date: 09/29/2015):   L ICA stenosis: 80-99% (long lesion)  L VA: patent and antegrade   Medical Decision Making  [name] is a 82 y.o. female who presents with: sx L ICA stenosis >80%, chronic kidney disease stage 3    I have concerns that this patient's lesion extends pass the level of the mandible.    As the cranial nerve complication increases greatly with high lesions, I would favor carotid artery stenting over endarterectomy to avoid possible causing permanent swallow dysfunction in the event she has a high lesion.  I recommended  a L carotid and cerebral angiogram to determine the level of the lesion.  She will has need preoperative risk stratification with Cardiology, as Anesthesia will not proceed without such evaluation preop.  If she risk stratifies to high risk, again she would need a carotid stent vs endarterectomy.  I discussed in depth with the patient the nature of atherosclerosis, and emphasized the importance of maximal medical management including strict control of blood pressure, blood glucose, and lipid levels, obtaining regular exercise, antiplatelet agents, and cessation of smoking.   The patient is currently on a statin: Lipitor.  The patient is currently on an anti-platelet: ASA.  The patient is aware that without maximal medical management the underlying atherosclerotic disease process will progress, limiting the benefit of any interventions.  Thank you for allowing [place] to participate in this patient's care.   Adele Barthel, MD [facility] Office: [phone] Pager: [phone]  09/29/2015, 10:05 AM

## 2015-10-02 ENCOUNTER — Telehealth: Payer: Self-pay | Admitting: Neurology

## 2015-10-02 NOTE — Telephone Encounter (Signed)
Pt's daughter is requesting a call from Dr. Jannifer Franklin. She would like to discuss the procedure pt is to have. Daughter is concerned and has several questions. Please call and advise [phone], [name]. [name] has faxed over Medical POA , and a HIPPA release form.

## 2015-10-02 NOTE — Progress Notes (Signed)
Patient ID: [name], female   DOB: 08/13/1924, 82 y.o.   MRN: [id number]     Cardiology Office Note   Date:  10/02/2015   ID:  NASHAY CART, DOB 13-Jun-1925, MRN [id number]  PCP:  Nyoka Cowden, MD  Cardiologist:   Jenkins Rouge, MD   No chief complaint on file.     History of Present Illness: [name] is a 82 y.o. female who presents for preop evaluation. History of stroke. Seen by Dr Bridgett Larsson and has high grade Left ICA stenosis and also PVD with decreased Pulses below knees. CRF;s HTN, elevated lipids and previous smoking.  CRF with CR 1.6.  History of aortic arch atherosclerosis by CTA 06/24/2014  Seen by Dr Recardo Evangelist 06/2014 for TEE as part of her w/u stroke EF normal 65% no valve disease. Severe ulcerated plaque in distal arch   She is active Generally feels great.  She is concerned about schedule angiogram this Thursday due to risk of stroke.  I tend to agree given atherosclerotic debris on TEE and previous stroke.    Lab Results  Component Value Date   CREATININE 1.52* 09/13/2015   BUN 23 09/13/2015   NA 138 09/13/2015   K 3.6 09/13/2015   CL 91* 09/13/2015   CO2 28 09/13/2015    CR of 1.52 was done on diuretic and I suspect she could have CTA to see level of disease as above / below mandible  Regarding her heart no history of CAD, CHF palpitations or syncope  She is on both plendil and norvasc for BP which a too similar  In office today having frequent asymptomatic PAC;s    Past Medical History  Diagnosis Date  . [place] DISEASE, LUMBAR 12/16/2008  . DIVERTICULOSIS, COLON 09/30/2008  . DYSPNEA 07/13/2008  . HYPERLIPIDEMIA 03/06/2007  . HYPERTENSION 03/06/2007  . HYPOTHYROIDISM 10/13/2007  . OSTEOARTHRITIS 03/06/2007  . Personal history of colonic polyps 09/30/2008  . TOBACCO USE, QUIT 04/12/2009  . WEAKNESS 11/09/2007  . Graves disease   . Aortic arch atherosclerosis ([place]) 06/24/2014  . Atherosclerotic ulcer of aorta ([place]) 06/24/2014  . History of embolic  stroke [id number]    Left brain    Past Surgical History  Procedure Laterality Date  . Cataract extraction    . Knee surgery    . Cholecystectomy    . Cardiac catheterization    . Tee without cardioversion N/A 06/24/2014    Procedure: TRANSESOPHAGEAL ECHOCARDIOGRAM (TEE);  Surgeon: Sanda Klein, MD;  Location: [facility] ENDOSCOPY;  Service: Cardiovascular;  Laterality: N/A;     Current Outpatient Prescriptions  Medication Sig Dispense Refill  . amLODipine (NORVASC) 2.5 MG tablet Take 1 tablet (2.5 mg total) by mouth daily. 90 tablet 3  . aspirin 325 MG tablet Take 325 mg by mouth daily.    [name] Kitchen atorvastatin (LIPITOR) 80 MG tablet TAKE 1 TABLET BY MOUTH EVERY DAY AT 6PM 90 tablet 1  . felodipine (PLENDIL) 10 MG 24 hr tablet Take 1 tablet (10 mg total) by mouth daily. 90 tablet 3  . hydrochlorothiazide (HYDRODIURIL) 25 MG tablet TAKE 1 TABLET BY MOUTH EVERY DAY 90 tablet 1  . levothyroxine (SYNTHROID, LEVOTHROID) 75 MCG tablet TAKE 1 TABLET BY MOUTH EVERY DAY 90 tablet 1   No current facility-administered medications for this visit.    Allergies:   Lisinopril    Social History:  The patient  reports that she quit smoking about 37 years ago. She has never used smokeless tobacco. She reports that  she drinks about 4.2 oz of alcohol per week. She reports that she does not use illicit drugs.   Family History:  The patient's family history includes Cancer in her maternal grandmother.    ROS:  Please see the history of present illness.   Otherwise, review of systems are positive for none.   All other systems are reviewed and negative.    PHYSICAL EXAM: VS:  There were no vitals taken for this visit. , BMI There is no weight on file to calculate BMI. Affect appropriate Elderly white female  HEENT: normal Neck supple with no adenopathy JVP normal bilateral  bruits no thyromegaly Lungs clear with no wheezing and good diaphragmatic motion Heart:  S1/S2 SEM  murmur, no rub, gallop or  click PMI normal Abdomen: benighn, BS positve, no tenderness, no AAA no bruit.  No HSM or HJR Distal pulses hard to palpate below knees  Trace bilateral edema Neuro non-focal Skin warm and dry No muscular weakness    EKG:   12.16.15  SR LVH IVCD   3/28  SR LVH IVCD PaC;s    Recent Labs: 02/15/2015: TSH 0.99 09/13/2015: BUN 23; Creatinine, Ser 1.52*; Potassium 3.6; Sodium 138    Lipid Panel    Component Value Date/Time   CHOL 184 02/15/2015 1122   TRIG 109.0 02/15/2015 1122   HDL 66.70 02/15/2015 1122   CHOLHDL 3 02/15/2015 1122   VLDL 21.8 02/15/2015 1122   LDLCALC 96 02/15/2015 1122   LDLDIRECT 141.7 05/01/2011 1037     Lab Results  Component Value Date   CREATININE 1.52* 09/13/2015   BUN 23 09/13/2015   NA 138 09/13/2015   K 3.6 09/13/2015   CL 91* 09/13/2015   CO2 28 09/13/2015    Wt Readings from Last 3 Encounters:  09/29/15 67.631 kg (149 lb 1.6 oz)  06/16/15 66.679 kg (147 lb)  04/05/15 65.998 kg (145 lb 8 oz)      Other studies Reviewed: Additional studies/ records that were reviewed today include:  Dr Bridgett Larsson note and [place] of carotids .    ASSESSMENT AND PLAN:  1. Preop  Will order lexiscan myovue to risk stratify given age and vascular disease 2. Stroke: Releated to arch debris try to avoid invasive angiogram.  Would hold diuretic for a week and check BMET And if Cr acceptable do CTA  Then make decision about open surgery or stent  Favor stent if blockage above mandible Or if she turns out to have  CAD 3. HtN:  Stop plendil increase norvasc and add normadyne for PAC;s and lowering cardiac risk as well as risk of PAF 4. Chol:  Cholesterol is at goal.  Continue current dose of statin and diet Rx.  No myalgias or side effects.  F/U  LFT's in 6 months. Lab Results  Component Value Date   LDLCALC 96 02/15/2015            5. Thyroid  On replacement  Lab Results  Component Value Date   TSH 0.99 02/15/2015    6. CRF  Hold diuretic BMET in a week     Current medicines are reviewed at length with the patient today.  The patient does not have concerns regarding medicines.  The following changes have been made:  Normadyne d/c plendil increase norvasc  Labs/ tests ordered today include: BMET  lexiscan myovue   No orders of the defined types were placed in this encounter.     Disposition:   FU with me post  test      Signed, Jenkins Rouge, MD  10/02/2015 10:40 AM    [facility] [place], [place], [place]  [address] Phone: [phone]; Fax: [phone]

## 2015-10-03 ENCOUNTER — Ambulatory Visit (INDEPENDENT_AMBULATORY_CARE_PROVIDER_SITE_OTHER): Payer: Medicare Other | Admitting: Cardiovascular Disease

## 2015-10-03 ENCOUNTER — Encounter: Payer: Self-pay | Admitting: Cardiovascular Disease

## 2015-10-03 VITALS — BP 172/64 | HR 83 | Ht 66.0 in | Wt 150.8 lb

## 2015-10-03 DIAGNOSIS — Z79899 Other long term (current) drug therapy: Secondary | ICD-10-CM

## 2015-10-03 DIAGNOSIS — I6522 Occlusion and stenosis of left carotid artery: Secondary | ICD-10-CM

## 2015-10-03 DIAGNOSIS — Z01818 Encounter for other preprocedural examination: Secondary | ICD-10-CM | POA: Diagnosis not present

## 2015-10-03 DIAGNOSIS — I999 Unspecified disorder of circulatory system: Secondary | ICD-10-CM

## 2015-10-03 MED ORDER — AMLODIPINE BESYLATE 10 MG PO TABS: 10.0000 mg | ORAL_TABLET | Freq: Every day | ORAL | Status: DC

## 2015-10-03 MED ORDER — LABETALOL HCL 300 MG PO TABS
300.0000 mg | ORAL_TABLET | Freq: Two times a day (BID) | ORAL | Status: DC
Start: 2015-10-03 — End: 2015-10-04

## 2015-10-03 NOTE — Patient Instructions (Addendum)
Medication Instructions:  Your physician has recommended you make the following change in your medication:  1- HOLD hydrodiuril 2-STOP Plendil 3-INCREASE Norvasc 10 mg by mouth daily 4- START labetalol 300 mg by mouth twice daily  Labwork: Your physician recommends that you return for lab work on 10/11/14 for BMET   Testing/Procedures: Your physician has requested that you have a lexiscan myoview as soon as possible. For further information please visit [email or web address]. Please follow instruction sheet, as given.  Follow-Up: Your physician wants you to follow-up next available with Dr. Johnsie Cancel.  If you need a refill on your cardiac medications before your next appointment, please call your pharmacy.

## 2015-10-03 NOTE — Telephone Encounter (Signed)
I called the daughter, left a message. The patient was to have a chronic angina gram, they have decided to do a CT angiogram. If the daughter has further questions, she is to contact our office again.

## 2015-10-03 NOTE — Progress Notes (Signed)
rec'd phone call from pt's daughter.  Stated the pt. Saw the Cardiologist, and recommended to cancel the Carotid angiogram at this time.  Daughter stated the plan is to change her diuretic, recheck kidney function, and do a cardiac stress test.  Daughter stated she will call the office when she has been cleared for further testing; stated the Cardiologist suggested a CTA of neck be done instead of a Carotid angiogram.  Daughter will call back re: rescheduling an appt. For the pt.

## 2015-10-03 NOTE — Telephone Encounter (Signed)
I spoke to daughter.  She  Had questions about procedure and did speak to Dr. Bridgett Larsson.  She then asked about doppler results and had other questions.

## 2015-10-04 ENCOUNTER — Observation Stay (HOSPITAL_COMMUNITY): Payer: Medicare Other

## 2015-10-04 ENCOUNTER — Telehealth: Payer: Self-pay | Admitting: Cardiovascular Disease

## 2015-10-04 ENCOUNTER — Telehealth (HOSPITAL_COMMUNITY): Payer: Self-pay | Admitting: *Deleted

## 2015-10-04 ENCOUNTER — Observation Stay (HOSPITAL_COMMUNITY)
Admission: EM | Admit: 2015-10-04 | Discharge: 2015-10-05 | Disposition: A | Discharge status: Home / Self Care | Payer: Medicare Other | Attending: Interventional Cardiology | Admitting: Interventional Cardiology

## 2015-10-04 ENCOUNTER — Encounter (HOSPITAL_COMMUNITY): Payer: Self-pay | Admitting: *Deleted

## 2015-10-04 DIAGNOSIS — I10 Essential (primary) hypertension: Secondary | ICD-10-CM | POA: Diagnosis not present

## 2015-10-04 DIAGNOSIS — I779 Disorder of arteries and arterioles, unspecified: Secondary | ICD-10-CM | POA: Diagnosis not present

## 2015-10-04 DIAGNOSIS — M5137 Other intervertebral disc degeneration, lumbosacral region: Secondary | ICD-10-CM | POA: Insufficient documentation

## 2015-10-04 DIAGNOSIS — E039 Hypothyroidism, unspecified: Secondary | ICD-10-CM | POA: Insufficient documentation

## 2015-10-04 DIAGNOSIS — N183 Chronic kidney disease, stage 3 (moderate): Secondary | ICD-10-CM | POA: Diagnosis not present

## 2015-10-04 DIAGNOSIS — Z7982 Long term (current) use of aspirin: Secondary | ICD-10-CM | POA: Insufficient documentation

## 2015-10-04 DIAGNOSIS — E0500 Thyrotoxicosis with diffuse goiter without thyrotoxic crisis or storm: Secondary | ICD-10-CM | POA: Insufficient documentation

## 2015-10-04 DIAGNOSIS — M1990 Unspecified osteoarthritis, unspecified site: Secondary | ICD-10-CM | POA: Insufficient documentation

## 2015-10-04 DIAGNOSIS — K5730 Diverticulosis of large intestine without perforation or abscess without bleeding: Secondary | ICD-10-CM | POA: Insufficient documentation

## 2015-10-04 DIAGNOSIS — R55 Syncope and collapse: Secondary | ICD-10-CM

## 2015-10-04 DIAGNOSIS — E785 Hyperlipidemia, unspecified: Secondary | ICD-10-CM | POA: Diagnosis not present

## 2015-10-04 DIAGNOSIS — R001 Bradycardia, unspecified: Principal | ICD-10-CM | POA: Diagnosis present

## 2015-10-04 DIAGNOSIS — Z79899 Other long term (current) drug therapy: Secondary | ICD-10-CM | POA: Insufficient documentation

## 2015-10-04 DIAGNOSIS — Z87891 Personal history of nicotine dependence: Secondary | ICD-10-CM | POA: Diagnosis not present

## 2015-10-04 DIAGNOSIS — I700 Atherosclerosis of aorta: Secondary | ICD-10-CM | POA: Diagnosis not present

## 2015-10-04 DIAGNOSIS — Z9889 Other specified postprocedural states: Secondary | ICD-10-CM | POA: Diagnosis not present

## 2015-10-04 DIAGNOSIS — R5383 Other fatigue: Secondary | ICD-10-CM | POA: Diagnosis present

## 2015-10-04 LAB — URINALYSIS, ROUTINE W REFLEX MICROSCOPIC
BILIRUBIN URINE: NEGATIVE
GLUCOSE, UA: NEGATIVE mg/dL
Hgb urine dipstick: NEGATIVE
KETONES UR: NEGATIVE mg/dL
Nitrite: NEGATIVE
PH: 7 (ref 5.0–8.0)
Protein, ur: NEGATIVE mg/dL
SPECIFIC GRAVITY, URINE: 1.013 (ref 1.005–1.030)

## 2015-10-04 LAB — CBC WITH DIFFERENTIAL/PLATELET
BASOS ABS: 0 10*3/uL (ref 0.0–0.1)
Basophils Relative: 0 %
EOS ABS: 0 10*3/uL (ref 0.0–0.7)
EOS PCT: 1 %
HEMATOCRIT: 33.4 % — AB (ref 36.0–46.0)
Hemoglobin: 11.3 g/dL — ABNORMAL LOW (ref 12.0–15.0)
LYMPHS PCT: 17 %
Lymphs Abs: 1.1 10*3/uL (ref 0.7–4.0)
MCH: 29.3 pg (ref 26.0–34.0)
MCHC: 33.8 g/dL (ref 30.0–36.0)
MCV: 86.5 fL (ref 78.0–100.0)
Monocytes Absolute: 0.4 10*3/uL (ref 0.1–1.0)
Monocytes Relative: 6 %
NEUTROS PCT: 76 %
Neutro Abs: 4.7 10*3/uL (ref 1.7–7.7)
Platelets: 180 10*3/uL (ref 150–400)
RBC: 3.86 MIL/uL — AB (ref 3.87–5.11)
RDW: 12.8 % (ref 11.5–15.5)
WBC: 6.2 10*3/uL (ref 4.0–10.5)

## 2015-10-04 LAB — BASIC METABOLIC PANEL
Anion gap: 10 (ref 5–15)
BUN: 15 mg/dL (ref 6–20)
CO2: 26 mmol/L (ref 22–32)
CREATININE: 1.25 mg/dL — AB (ref 0.44–1.00)
Calcium: 8.5 mg/dL — ABNORMAL LOW (ref 8.9–10.3)
Chloride: 95 mmol/L — ABNORMAL LOW (ref 101–111)
GFR, EST AFRICAN AMERICAN: 43 mL/min — AB (ref >60)
GFR, EST NON AFRICAN AMERICAN: 37 mL/min — AB (ref >60)
Glucose, Bld: 169 mg/dL — ABNORMAL HIGH (ref 65–99)
POTASSIUM: 2.9 mmol/L — AB (ref 3.5–5.1)
SODIUM: 131 mmol/L — AB (ref 135–145)

## 2015-10-04 LAB — MAGNESIUM: MAGNESIUM: 2.1 mg/dL (ref 1.7–2.4)

## 2015-10-04 LAB — I-STAT CHEM 8, ED
BUN: 17 mg/dL (ref 6–20)
CHLORIDE: 87 mmol/L — AB (ref 101–111)
CREATININE: 1.2 mg/dL — AB (ref 0.44–1.00)
Calcium, Ion: 1 mmol/L — ABNORMAL LOW (ref 1.13–1.30)
Glucose, Bld: 188 mg/dL — ABNORMAL HIGH (ref 65–99)
HEMATOCRIT: 39 % (ref 36.0–46.0)
HEMOGLOBIN: 13.3 g/dL (ref 12.0–15.0)
POTASSIUM: 2.6 mmol/L — AB (ref 3.5–5.1)
Sodium: 131 mmol/L — ABNORMAL LOW (ref 135–145)
TCO2: 28 mmol/L (ref 0–100)

## 2015-10-04 LAB — I-STAT TROPONIN, ED: Troponin i, poc: 0.01 ng/mL (ref 0.00–0.08)

## 2015-10-04 LAB — URINE MICROSCOPIC-ADD ON

## 2015-10-04 MED ORDER — ASPIRIN EC 81 MG PO TBEC
81.0000 mg | DELAYED_RELEASE_TABLET | Freq: Every day | ORAL | Status: DC
  Filled 2015-10-04: qty 1

## 2015-10-04 MED ORDER — ONDANSETRON HCL 4 MG/2ML IJ SOLN: 4.0000 mg | Freq: Four times a day (QID) | INTRAMUSCULAR | Status: DC | PRN

## 2015-10-04 MED ORDER — HEPARIN SODIUM (PORCINE) 5000 UNIT/ML IJ SOLN
5000.0000 [IU] | Freq: Three times a day (TID) | INTRAMUSCULAR | Status: DC
  Administered 2015-10-04 – 2015-10-05 (×2): 5000 [IU] via SUBCUTANEOUS
  Filled 2015-10-04 (×2): qty 1

## 2015-10-04 MED ORDER — AMLODIPINE BESYLATE 5 MG PO TABS: 5.0000 mg | ORAL_TABLET | Freq: Every day | ORAL | Status: DC

## 2015-10-04 MED ORDER — AMLODIPINE BESYLATE 5 MG PO TABS: 10.0000 mg | ORAL_TABLET | Freq: Every day | ORAL | Status: DC

## 2015-10-04 MED ORDER — ACETAMINOPHEN 325 MG PO TABS
650.0000 mg | ORAL_TABLET | ORAL | Status: DC | PRN
  Administered 2015-10-05: 650 mg via ORAL

## 2015-10-04 MED ORDER — POTASSIUM CHLORIDE CRYS ER 20 MEQ PO TBCR
40.0000 meq | EXTENDED_RELEASE_TABLET | Freq: Three times a day (TID) | ORAL | Status: AC
  Administered 2015-10-04 – 2015-10-05 (×3): 40 meq via ORAL
  Filled 2015-10-04 (×3): qty 2

## 2015-10-04 MED ORDER — POTASSIUM CHLORIDE 10 MEQ/100ML IV SOLN
10.0000 meq | Freq: Once | INTRAVENOUS | Status: AC
  Administered 2015-10-04: 10 meq via INTRAVENOUS
  Filled 2015-10-04: qty 100

## 2015-10-04 MED ORDER — ATORVASTATIN CALCIUM 80 MG PO TABS
80.0000 mg | ORAL_TABLET | Freq: Every day | ORAL | Status: DC
  Administered 2015-10-04: 80 mg via ORAL
  Filled 2015-10-04: qty 1

## 2015-10-04 MED ORDER — LEVOTHYROXINE SODIUM 75 MCG PO TABS
75.0000 ug | ORAL_TABLET | Freq: Every day | ORAL | Status: DC
  Administered 2015-10-05: 75 ug via ORAL
  Filled 2015-10-04: qty 1

## 2015-10-04 MED ORDER — SODIUM CHLORIDE 0.9 % IV BOLUS (SEPSIS)
500.0000 mL | Freq: Once | INTRAVENOUS | Status: AC
  Administered 2015-10-04: 500 mL via INTRAVENOUS

## 2015-10-04 NOTE — ED Provider Notes (Signed)
CSN: [id number]     Arrival date & time 10/04/15  [id number] History   First MD Initiated Contact with Patient 10/04/15 [phone]     Chief Complaint  Patient presents with  . Fatigue  . Medication Reaction      HPI Pt arrives from home via [facility]. Pt states she took her first labetalol today and about 30 min after taking it she became lightheaded, dizzy, weak, nauseous, diaphoretic and experienced one episode of vomiting. Pt states she felt normal before taking labetalol. Upon arrival pt states all sx have resolved other than fatigue. Pt endorses taking morning meds which includes 325mg  of ASA. Past Medical History  Diagnosis Date  . [place] DISEASE, LUMBAR 12/16/2008  . DIVERTICULOSIS, COLON 09/30/2008  . DYSPNEA 07/13/2008  . HYPERLIPIDEMIA 03/06/2007  . HYPERTENSION 03/06/2007  . HYPOTHYROIDISM 10/13/2007  . OSTEOARTHRITIS 03/06/2007  . Personal history of colonic polyps 09/30/2008  . TOBACCO USE, QUIT 04/12/2009  . WEAKNESS 11/09/2007  . Graves disease   . Aortic arch atherosclerosis ([place]) 06/24/2014  . Atherosclerotic ulcer of aorta ([place]) 06/24/2014  . History of embolic stroke [id number]    Left brain   Past Surgical History  Procedure Laterality Date  . Cataract extraction    . Knee surgery    . Cholecystectomy    . Cardiac catheterization    . Tee without cardioversion N/A 06/24/2014    Procedure: TRANSESOPHAGEAL ECHOCARDIOGRAM (TEE);  Surgeon: Sanda Klein, MD;  Location: [facility] ENDOSCOPY;  Service: Cardiovascular;  Laterality: N/A;   Family History  Problem Relation Age of Onset  . Cancer Maternal Grandmother     stomach   Social History  Substance Use Topics  . Smoking status: Former Smoker    Quit date: 07/08/1978  . Smokeless tobacco: Never Used  . Alcohol Use: 4.2 oz/week    7 Glasses of wine per week     Comment: "red wine"   OB History    No data available     Review of Systems All other systems reviewed and are negative   Allergies  Lisinopril  Home Medications    Prior to Admission medications   Medication Sig Start Date End Date Taking? Authorizing Provider  amLODipine (NORVASC) 10 MG tablet Take 1 tablet (10 mg total) by mouth daily. Patient taking differently: Take 10 mg by mouth daily at 12 noon.  10/03/15  Yes Josue Hector, MD  aspirin 325 MG tablet Take 325 mg by mouth daily.   Yes Historical Provider, MD  atorvastatin (LIPITOR) 80 MG tablet TAKE 1 TABLET BY MOUTH EVERY DAY AT 6PM Patient taking differently: TAKE 1 TABLET BY MOUTH daily 05/08/15  Yes Marletta Lor, MD  hydrochlorothiazide (HYDRODIURIL) 25 MG tablet TAKE 1 TABLET BY MOUTH EVERY DAY 12/27/14  Yes Marletta Lor, MD  levothyroxine (SYNTHROID, LEVOTHROID) 75 MCG tablet TAKE 1 TABLET BY MOUTH EVERY DAY 05/01/15  Yes Marletta Lor, MD   BP 130/64 mmHg  Pulse 55  Resp 12  SpO2 97% Physical Exam Physical Exam  Nursing note and vitals reviewed. Constitutional: She is oriented to person, place, and time. She appears well-developed and well-nourished. No distress.  HENT:  Head: Normocephalic and atraumatic.  Eyes: Pupils are equal, round, and reactive to light.  Neck: Normal range of motion.  Cardiovascular: Normal rate and intact distal pulses.   Pulmonary/Chest: No respiratory distress.  Abdominal: Normal appearance. She exhibits no distension.  Musculoskeletal: Normal range of motion.  Neurological: She is alert and oriented to person,  place, and time. No cranial nerve deficit.  Skin: Skin is warm and dry. No rash noted.  Psychiatric: She has a normal mood and affect. Her behavior is normal.   ED Course  Procedures (including critical care time) Cardiology was consult and will see the patient emergency room. Labs Review Labs Reviewed  I-STAT CHEM 8, ED - Abnormal; Notable for the following:    Sodium 131 (*)    Potassium 2.6 (*)    Chloride 87 (*)    Creatinine, Ser 1.20 (*)    Glucose, Bld 188 (*)    Calcium, Ion 1.00 (*)    All other components  within normal limits  MAGNESIUM  BASIC METABOLIC PANEL  CBC WITH DIFFERENTIAL/PLATELET  URINALYSIS, ROUTINE W REFLEX MICROSCOPIC (NOT AT [facility])  I-STAT TROPOININ, ED    Imaging Review No results found. I have personally reviewed and evaluated these images and lab results as part of my medical decision-making.   EKG Interpretation   Date/Time:  Wednesday October 04 2015 09:31:13 EDT Ventricular Rate:  48 PR Interval:    QRS Duration: 129 QT Interval:  584 QTC Calculation: 522 R Axis:   13 Text Interpretation:  Junctional rhythm IVCD, consider atypical LBBB  Confirmed by Francelia Mclaren  MD, Minh Jasper ([id number]) on 10/04/2015 11:10:53 AM      MDM   Final diagnoses:  Bradycardia with less than 60 beats per minute  Syncope, unspecified syncope type        Leonard Schwartz, MD 10/04/15 1436

## 2015-10-04 NOTE — ED Notes (Signed)
Attempted report to 3 W. 

## 2015-10-04 NOTE — ED Notes (Signed)
Pt arrives from home via [facility]. Pt states she took her first labetalol today and about 30 min after taking it she became lightheaded, dizzy, weak, nauseous, diaphoretic and experienced one episode of vomiting. Pt states she felt normal before taking labetalol. Upon arrival pt states all sx have resolved other than fatigue. Pt endorses taking morning meds which includes 325mg  of ASA.

## 2015-10-04 NOTE — Telephone Encounter (Signed)
Left message on voicemail per DPR in reference to upcoming appointment scheduled on 10/09/15 with detailed instructions given per Myocardial Perfusion Study Information Sheet for the test. LM to arrive 15 minutes early, and that it is imperative to arrive on time for appointment to keep from having the test rescheduled. If you need to cancel or reschedule your appointment, please call the office within 24 hours of your appointment. Failure to do so may result in a cancellation of your appointment, and a $50 no show fee. Phone number given for call back for any questions. Hubbard Robinson, RN

## 2015-10-04 NOTE — Telephone Encounter (Signed)
Called patient's daughter. Patient is still at the hospital. Dr. Johnsie Cancel is aware. Per Dr. Johnsie Cancel discontinue the Labetalol and start hydralazine 25 mg TID. Daughter does not want patient to take any thing new at this time, without close observation. Daughter stated patient has a hard time with medication changes. Daughter also stated patient has been really stressed. Encouraged daughter to consult with ED doctor.

## 2015-10-04 NOTE — ED Notes (Signed)
Phlebotomy at bedside.

## 2015-10-04 NOTE — H&P (Signed)
Patient ID: [name] MRN: [id number] DOB/AGE: 08/08/1924 82 y.o.  Admit date: 10/04/2015  Primary Physician Nyoka Cowden, MD Primary Cardiologist: Dr. Johnsie Cancel Reason for Consultation: Syncope  HPI: [name] is a 82 year old female with a past medical history of HTN, HLD, hypothyroidism, CVA (left parietal in Dec. 2015). No prior CAD. Echo done in Dec. 2015 showed EF of 60-65%, severe ulcerated plaque in mid to distal aortic arch.   She was seen by Dr. Johnsie Cancel yesterday for pre operative clearance for possible LCEA.. She is followed by vascular surgery (Dr. Bridgett Larsson) and was scheduled for cerebral angiogram. She became concerned about the risk of stroke and asked to have the procedure postponed until after cardiac evaluation. Doppler evaluation suggests a long LICA stenosis XX123456. She was started on labetalol and her Norvasc was increased for better blood pressure control during the OV with Dr. Johnsie Cancel yesterday.  She was in usual state of health this morning and took her labetalol (her first dose) at 7:15 am, shortly after around 8 am, she began to feel weak, flushed and became very hot. She states then that she got up to get her life alert necklace, sat back down and put her head on the table and passed out. She remembers putting her head on the table. She says when she came to, she pressed her life alert and EMS was called. She thinks that she lost consciousness for 15 minutes. While in route to the hospital she vomited one time. She did not feel nauseous or vomit before her syncopal episode.   Her symptoms have resolved mostly, however she does report some mild dizziness. Review of telemetry shows some transient bradycardia with rates 38-48. Her rate now is in the upper 50's.   She is scheduled for a Myovue on Monday April 4th as ordered by Dr. Johnsie Cancel for preoperative clearance.   Denies chest pain, SOB, palpitations. Of note, these symptoms today are not similar to when  she had her CVA in December of 2015. She has never had any syncope in the past.    Past Medical History  Diagnosis Date  . [place] DISEASE, LUMBAR 12/16/2008  . DIVERTICULOSIS, COLON 09/30/2008  . DYSPNEA 07/13/2008  . HYPERLIPIDEMIA 03/06/2007  . HYPERTENSION 03/06/2007  . HYPOTHYROIDISM 10/13/2007  . OSTEOARTHRITIS 03/06/2007  . Personal history of colonic polyps 09/30/2008  . TOBACCO USE, QUIT 04/12/2009  . WEAKNESS 11/09/2007  . Graves disease   . Aortic arch atherosclerosis ([place]) 06/24/2014  . Atherosclerotic ulcer of aorta ([place]) 06/24/2014  . History of embolic stroke [id number]    Left brain     Past Surgical History  Procedure Laterality Date  . Cataract extraction    . Knee surgery    . Cholecystectomy    . Cardiac catheterization    . Tee without cardioversion N/A 06/24/2014    Procedure: TRANSESOPHAGEAL ECHOCARDIOGRAM (TEE); Surgeon: Sanda Klein, MD; Location: [facility] ENDOSCOPY; Service: Cardiovascular; Laterality: N/A;    Allergies  Allergen Reactions  . Lisinopril Anaphylaxis    Tongue swelling    Prior to Admission medications   Medication Sig Start Date End Date Taking? Authorizing Provider  amLODipine (NORVASC) 10 MG tablet Take 1 tablet (10 mg total) by mouth daily. Patient taking differently: Take 10 mg by mouth daily at 12 noon.  10/03/15  Yes Josue Hector, MD  aspirin 325 MG tablet Take 325 mg by mouth daily.   Yes Historical Provider, MD  atorvastatin (LIPITOR) 80 MG tablet TAKE 1 TABLET BY  MOUTH EVERY DAY AT 6PM Patient taking differently: TAKE 1 TABLET BY MOUTH daily 05/08/15  Yes Marletta Lor, MD  hydrochlorothiazide (HYDRODIURIL) 25 MG tablet TAKE 1 TABLET BY MOUTH EVERY DAY 12/27/14  Yes Marletta Lor, MD  labetalol (NORMODYNE) 300 MG tablet Take 300 mg by mouth 2 (two) times daily.   Yes Historical Provider, MD    levothyroxine (SYNTHROID, LEVOTHROID) 75 MCG tablet TAKE 1 TABLET BY MOUTH EVERY DAY 05/01/15  Yes Marletta Lor, MD    Social History   Social History  . Marital Status: Divorced    Spouse Name: N/A  . Number of Children: 4  . Years of Education: college   Occupational History  . Not on file.   Social History Main Topics  . Smoking status: Former Smoker    Quit date: 07/08/1978  . Smokeless tobacco: Never Used  . Alcohol Use: 4.2 oz/week    7 Glasses of wine per week     Comment: "red wine"  . Drug Use: No  . Sexual Activity: Not on file   Other Topics Concern  . Not on file   Social History Narrative   Patient is right handed.   Patient drinks 1 cup caffeine daily.    Family Status  Relation Status Death Age  . Mother Deceased 47    breast CA  . Father Deceased 63    MI  . Brother Alive    Family History  Problem Relation Age of Onset  . Cancer Maternal Grandmother     stomach     ROS: Full 14 point review of systems complete and found to be negative unless listed above.  Physical Exam: Blood pressure 127/53, pulse 51, resp. rate 17, SpO2 98 %.  General: Well developed, well nourished, elderly female in no acute distress Head: Eyes PERRLA, No xanthomas. Normocephalic and atraumatic, oropharynx without edema or exudate. Dentition: good  Lungs: CTA Heart: HRRR S1 S2, no rub/gallop, No murmur. Pulses are +1 in BLE  Neck: No carotid bruits. No lymphadenopathy. No JVD. Abdomen: Bowel sounds present, abdomen soft and non-tender without masses or hernias noted. Msk: No spine or cva tenderness. No weakness, no joint deformities or effusions. Extremities: No clubbing or cyanosis. No edema.  Neuro: Alert and oriented X 3. No focal deficits noted. Psych: Good affect, responds appropriately Skin: No rashes or lesions noted.  Labs:   Recent  Labs    Lab Results  Component Value Date   WBC 4.8 06/23/2014   HGB 13.3 10/04/2015   HCT 39.0 10/04/2015   MCV 89.7 06/23/2014   PLT 215 06/23/2014      Last Labs    MAGNESIUM  Date Value Ref Range Status  10/04/2015 2.1 1.7 - 2.4 mg/dL Final      Recent Labs (last 2 labs)      Recent Labs  10/04/15 1010  TROPIPOC 0.01      Echo: Dec. 2015 - Left ventricle: There was mild concentric hypertrophy. Systolic function was normal. The estimated ejection fraction was in the range of 60% to 65%. Wall motion was normal; there were no regional wall motion abnormalities. - Aortic valve: There was mild to moderate regurgitation directed centrally in the LVOT. - Ascending aorta: The ascending aorta was normal in size. Mild atherosclerotic plaque. - Aortic arch: The aortic arch was normal in size. Severe ulcerated plaque, especially severe in the mid to distal arch. - Descending aorta: The descending aorta was normal in size. Very severe  thick ulcerated plaque with mobile components. - Left atrium: No evidence of thrombus in the atrial cavity or appendage. - Right atrium: No evidence of thrombus in the atrial cavity or appendage. - Atrial septum: Echo contrast study showed no right-to-left atrial level shunt, at baseline or with provocation.   ECG: Junctional with LBBB   ASSESSMENT AND PLAN:  Principal Problem:  Syncope Active Problems:  Bradycardia with less than 60 beats per minute  1. Syncope- Sounds like her syncope is related to reaction from labetalol. She has never had syncope before today. She is very active, still drives and takes care of herself. She walks up her stairs in her home daily without any SOB, participates in church activities regularly. We will admit to tele to monitor her rhythm.   2. Bradycardia- Her HR is decreased with rates in the 40-50's. When she was seen in the office yesterday  her HR was 83. Will D/c beta blockers and add labetalol to allergy list. She has a nuclear study planned for Monday April 4th, we can consider moving up and doing earlier. EKG shows junctional rhythm, however review of tele shows sinus bradycardia.   3. Probable critical left carotid stenosis  4. Significant essential hypertension  5. CKD, stage II-III: Kidney function appears improved off diuretic therapy    Signed: Arbutus Leas, NP 10/04/2015 1:52 PM Pager [phone]  Co-Sign MD   The patient has been seen in conjunction with Jettie Booze, NP. All aspects of care have been considered and discussed. The patient has been personally interviewed, examined, and all clinical data has been reviewed.   The history is as outlined above. She has left carotid disease, and carotid endarterectomy versus stenting is being contemplated.  She was seen for preoperative cardiac evaluation on yesterday. Medication adjustments were made as outlined above. Labetalol 300 mg twice a day was started and Norvasc increased to 10 mg per day. Plendil was discontinued. HCTZ was placed on whole.  She had an episode of profound weakness this morning approximately one hour after taking the first dose of labetalol. She has not yet had the daily dose of amlodipine.  Bradycardia is noted on telemetry.  Plan will be observation, telemetry, discontinuation of beta blocker therapy, adjust amlodipine dose to blood pressure control, and hold diuretic regimen until after use a CT angiogram or catheter-based cerebral angiography. We will go ahead and perform a pharmacologic Myoview study in a.m. to complete the preoperative cardiovascular evaluation.      Revision History     Date/Time User Provider Type Action   10/04/2015 4:45 PM Belva Crome, MD Physician Sign   10/04/2015 2:20 PM Arbutus Leas, NP Nurse Practitioner Sign   10/04/2015 2:11 PM Arbutus Leas, NP Nurse Practitioner Sign   10/04/2015 2:00 PM  Arbutus Leas, NP Nurse Practitioner Sign   View Details Report       Routing History     Date/Time From To Method   10/04/2015 4:45 PM Belva Crome, MD Marletta Lor, MD In [place]

## 2015-10-04 NOTE — Telephone Encounter (Signed)
New Message:  Pt's daughter called in stating that the pt took the Labetalol 300mg  that Dr. Johnsie Cancel prescribed for her yesterday, and she has had a bad reaction and is now on the way to the ER. She just wanted to inform the doctor of this.

## 2015-10-04 NOTE — Consult Note (Signed)
CARDIOLOGY CONSULT NOTE   Patient ID: FRAN HECKARD MRN: [id number] DOB/AGE: 80-02-1925 80 y.o.  Admit date: 10/04/2015  Primary Physician   Nyoka Cowden, MD Primary Cardiologist: Dr. Johnsie Cancel Reason for Consultation: Syncope  HPI: Ms. Lamkin is a 82 year old female with a past medical history of HTN, HLD, hypothyroidism, CVA (left parietal in Dec. 2015). No prior CAD. Echo done in Dec. 2015 showed EF of 60-65%, severe ulcerated plaque in mid to distal aortic arch.    She was seen by Dr. Johnsie Cancel yesterday for pre operative clearance for possible LCEA..  She is followed by vascular surgery (Dr. Bridgett Larsson) and was scheduled for cerebral angiogram. She became concerned about the risk of stroke and asked to have the procedure postponed until after cardiac evaluation. Doppler evaluation suggests a long LICA stenosis [id number]. She was started on labetalol and her Norvasc was increased for better blood pressure control during the OV with Dr. Johnsie Cancel yesterday.  She was in usual state of health this morning and took her labetalol (her first dose) at 7:15 am, shortly after around 8 am, she began to feel weak, flushed and became very hot.  She states then that she got up to get her life alert necklace, sat back down and put her head on the table and passed out.  She remembers putting her head on the table.  She says when she came to, she pressed her life alert and EMS was called.  She thinks that she lost consciousness for 15 minutes. While in route to the hospital she vomited one time.  She did not feel nauseous or vomit before her syncopal episode.   Her symptoms have resolved mostly, however she does report some mild dizziness.  Review of telemetry shows some transient bradycardia with rates 38-48. Her rate now is in the upper 50's.   She is scheduled for a Myovue on Monday April 4th as ordered by Dr. Johnsie Cancel for preoperative clearance.    Denies chest pain, SOB, palpitations.  Of note, these  symptoms today are not similar to when she had her CVA in December of 2015.   She has never had any syncope in the past.    Past Medical History  Diagnosis Date  . [place] DISEASE, LUMBAR 12/16/2008  . DIVERTICULOSIS, COLON 09/30/2008  . DYSPNEA 07/13/2008  . HYPERLIPIDEMIA 03/06/2007  . HYPERTENSION 03/06/2007  . HYPOTHYROIDISM 10/13/2007  . OSTEOARTHRITIS 03/06/2007  . Personal history of colonic polyps 09/30/2008  . TOBACCO USE, QUIT 04/12/2009  . WEAKNESS 11/09/2007  . Graves disease   . Aortic arch atherosclerosis ([place]) 06/24/2014  . Atherosclerotic ulcer of aorta ([place]) 06/24/2014  . History of embolic stroke [id number]    Left brain     Past Surgical History  Procedure Laterality Date  . Cataract extraction    . Knee surgery    . Cholecystectomy    . Cardiac catheterization    . Tee without cardioversion N/A 06/24/2014    Procedure: TRANSESOPHAGEAL ECHOCARDIOGRAM (TEE);  Surgeon: Sanda Klein, MD;  [facility] ENDOSCOPY;  Service: Cardiovascular;  Laterality: N/A;    Allergies  Allergen Reactions  . Lisinopril Anaphylaxis    Tongue swelling    Prior to Admission medications   Medication Sig Start Date End Date Taking? Authorizing Provider  amLODipine (NORVASC) 10 MG tablet Take 1 tablet (10 mg total) by mouth daily. Patient taking differently: Take 10 mg by mouth daily at 12 noon.  10/03/15  Yes Josue Hector, MD  aspirin 325 MG tablet Take 325 mg by mouth daily.   Yes Historical Provider, MD  atorvastatin (LIPITOR) 80 MG tablet TAKE 1 TABLET BY MOUTH EVERY DAY AT 6PM Patient taking differently: TAKE 1 TABLET BY MOUTH daily 05/08/15  Yes Marletta Lor, MD  hydrochlorothiazide (HYDRODIURIL) 25 MG tablet TAKE 1 TABLET BY MOUTH EVERY DAY 12/27/14  Yes Marletta Lor, MD  labetalol (NORMODYNE) 300 MG tablet Take 300 mg by mouth 2 (two) times daily.   Yes Historical Provider, MD  levothyroxine (SYNTHROID, LEVOTHROID) 75 MCG tablet TAKE 1 TABLET BY MOUTH EVERY DAY 05/01/15   Yes Marletta Lor, MD     Social History   Social History  . Marital Status: Divorced    Spouse Name: N/A  . Number of Children: 4  . Years of Education: college   Occupational History  . Not on file.   Social History Main Topics  . Smoking status: Former Smoker    Quit date: 07/08/1978  . Smokeless tobacco: Never Used  . Alcohol Use: 4.2 oz/week    7 Glasses of wine per week     Comment: "red wine"  . Drug Use: No  . Sexual Activity: Not on file   Other Topics Concern  . Not on file   Social History Narrative   Patient is right handed.   Patient drinks 1 cup caffeine daily.    Family Status  Relation Status Death Age  . Mother Deceased 5    breast CA  . Father Deceased 26    MI  . Brother Alive    Family History  Problem Relation Age of Onset  . Cancer Maternal Grandmother     stomach     ROS:  Full 14 point review of systems complete and found to be negative unless listed above.  Physical Exam: Blood pressure 127/53, pulse 51, resp. rate 17, SpO2 98 %.  General: Well developed, well nourished, elderly  female in no acute distress Head: Eyes PERRLA, No xanthomas.   Normocephalic and atraumatic, oropharynx without edema or exudate. Dentition: good  Lungs: CTA Heart: HRRR S1 S2, no rub/gallop, No murmur. Pulses are +1 in BLE  Neck: No carotid bruits. No lymphadenopathy.  No JVD. Abdomen: Bowel sounds present, abdomen soft and non-tender without masses or hernias noted. Msk:  No spine or cva tenderness. No weakness, no joint deformities or effusions. Extremities: No clubbing or cyanosis. No edema.  Neuro: Alert and oriented X 3. No focal deficits noted. Psych:  Good affect, responds appropriately Skin: No rashes or lesions noted.  Labs:   Lab Results  Component Value Date   WBC 4.8 06/23/2014   HGB 13.3 10/04/2015   HCT 39.0 10/04/2015   MCV 89.7 06/23/2014   PLT 215 06/23/2014   MAGNESIUM  Date Value Ref Range Status  10/04/2015 2.1 1.7 -  2.4 mg/dL Final    Recent Labs  10/04/15 1010  TROPIPOC 0.01    Echo: Dec. 2015 - Left ventricle: There was mild concentric hypertrophy. Systolic function was normal. The estimated ejection fraction was in the range of 60% to 65%. Wall motion was normal; there were no regional wall motion abnormalities. - Aortic valve: There was mild to moderate regurgitation directed centrally in the LVOT. - Ascending aorta: The ascending aorta was normal in size. Mild atherosclerotic plaque. - Aortic arch: The aortic arch was normal in size. Severe ulcerated plaque, especially severe in the mid to distal arch. - Descending aorta: The descending aorta  was normal in size. Very severe thick ulcerated plaque with mobile components. - Left atrium: No evidence of thrombus in the atrial cavity or appendage. - Right atrium: No evidence of thrombus in the atrial cavity or appendage. - Atrial septum: Echo contrast study showed no right-to-left atrial level shunt, at baseline or with provocation.   ECG:  Junctional with LBBB   ASSESSMENT AND PLAN:    Principal Problem:   Syncope Active Problems:   Bradycardia with less than 60 beats per minute  1. Syncope- Sounds like her syncope is related to reaction from labetalol.  She has never had syncope before today.  She is very active, still drives and takes care of herself.  She walks up her stairs in her home daily without any SOB, participates in church activities regularly. We will admit to tele to monitor her rhythm.   2. Bradycardia- Her HR is decreased with rates in the 40-50's. When she was seen in the office yesterday her HR was 83.  Will D/c beta blockers and add labetalol to allergy list. She has a nuclear study planned for Monday April 4th, we can consider moving up and doing earlier. EKG shows junctional rhythm, however review of tele shows sinus bradycardia.   3. Probable critical left carotid stenosis  4. Significant  essential hypertension  5. CKD, stage II-III: Kidney function appears improved off diuretic therapy    Signed: Arbutus Leas, NP 10/04/2015 1:52 PM Pager [phone]  Co-Sign MD   The patient has been seen in conjunction with Jettie Booze, NP. All aspects of care have been considered and discussed. The patient has been personally interviewed, examined, and all clinical data has been reviewed.   The history is as outlined above. She has left carotid disease, and carotid endarterectomy versus stenting is being contemplated.  She was seen for preoperative cardiac evaluation on yesterday. Medication adjustments were made as outlined above. Labetalol 300 mg twice a day was started and Norvasc increased to 10 mg per day. Plendil was discontinued. HCTZ was placed on whole.  She had an episode of profound weakness this morning approximately one hour after taking the first dose of labetalol. She has not yet had the daily dose of amlodipine.  Bradycardia is noted on telemetry.  Plan will be observation, telemetry, discontinuation of beta blocker therapy, adjust amlodipine dose to blood pressure control, and hold diuretic regimen until after use a CT angiogram or catheter-based cerebral angiography. We will go ahead and perform a pharmacologic Myoview study in a.m. to complete the preoperative cardiovascular evaluation.

## 2015-10-04 NOTE — ED Notes (Signed)
Cardiology MD at bedside.

## 2015-10-05 ENCOUNTER — Observation Stay (HOSPITAL_BASED_OUTPATIENT_CLINIC_OR_DEPARTMENT_OTHER): Payer: Medicare Other

## 2015-10-05 ENCOUNTER — Other Ambulatory Visit: Payer: Self-pay | Admitting: Interventional Cardiology

## 2015-10-05 ENCOUNTER — Encounter (HOSPITAL_COMMUNITY): Admission: RE | Payer: Self-pay | Source: Ambulatory Visit

## 2015-10-05 ENCOUNTER — Observation Stay (HOSPITAL_COMMUNITY): Payer: Medicare Other

## 2015-10-05 ENCOUNTER — Ambulatory Visit (HOSPITAL_COMMUNITY): Admission: RE | Admit: 2015-10-05 | Payer: Medicare Other | Source: Ambulatory Visit | Admitting: Vascular Surgery

## 2015-10-05 DIAGNOSIS — R55 Syncope and collapse: Secondary | ICD-10-CM | POA: Diagnosis not present

## 2015-10-05 DIAGNOSIS — R001 Bradycardia, unspecified: Secondary | ICD-10-CM | POA: Diagnosis not present

## 2015-10-05 LAB — BASIC METABOLIC PANEL
Anion gap: 10 (ref 5–15)
BUN: 15 mg/dL (ref 6–20)
CALCIUM: 8.6 mg/dL — AB (ref 8.9–10.3)
CO2: 26 mmol/L (ref 22–32)
CREATININE: 1.34 mg/dL — AB (ref 0.44–1.00)
Chloride: 96 mmol/L — ABNORMAL LOW (ref 101–111)
GFR calc Af Amer: 40 mL/min — ABNORMAL LOW (ref >60)
GFR, EST NON AFRICAN AMERICAN: 34 mL/min — AB (ref >60)
GLUCOSE: 100 mg/dL — AB (ref 65–99)
POTASSIUM: 3.3 mmol/L — AB (ref 3.5–5.1)
SODIUM: 132 mmol/L — AB (ref 135–145)

## 2015-10-05 LAB — NM MYOCAR MULTI W/SPECT W/WALL MOTION / EF
CHL CUP MPHR: 132 {beats}/min
CHL CUP NUCLEAR SSS: 9
CHL CUP STRESS STAGE 1 DBP: 72 mmHg
CHL CUP STRESS STAGE 1 GRADE: 0 %
CHL CUP STRESS STAGE 1 HR: 80 {beats}/min
CHL CUP STRESS STAGE 1 SBP: 168 mmHg
CHL CUP STRESS STAGE 1 SPEED: 0 mph
CHL CUP STRESS STAGE 2 GRADE: 0 %
CHL CUP STRESS STAGE 2 HR: 80 {beats}/min
CHL CUP STRESS STAGE 4 GRADE: 0 %
CHL CUP STRESS STAGE 4 SPEED: 0 mph
CHL CUP STRESS STAGE 5 GRADE: 0 %
CHL CUP STRESS STAGE 5 HR: 79 {beats}/min
CSEPHR: 76 %
Estimated workload: 1 METS
Exercise duration (min): 0 min
Exercise duration (sec): 0 s
LHR: 0.33
LVDIAVOL: 67 mL (ref 46–106)
LVSYSVOL: 29 mL
Peak BP: 153 mmHg
Peak HR: 79 {beats}/min
Percent of predicted max HR: 59 %
Rest HR: 66 {beats}/min
SDS: 1
SRS: 8
Stage 2 Speed: 0 mph
Stage 3 DBP: 62 mmHg
Stage 3 Grade: 0 %
Stage 3 HR: 84 {beats}/min
Stage 3 SBP: 152 mmHg
Stage 3 Speed: 0 mph
Stage 4 DBP: 61 mmHg
Stage 4 HR: 78 {beats}/min
Stage 4 SBP: 152 mmHg
Stage 5 DBP: 61 mmHg
Stage 5 SBP: 153 mmHg
Stage 5 Speed: 0 mph
TID: 1.21

## 2015-10-05 LAB — TSH: TSH: 2.443 u[IU]/mL (ref 0.350–4.500)

## 2015-10-05 SURGERY — CAROTID ANGIOGRAPHY
Anesthesia: LOCAL

## 2015-10-05 MED ORDER — AMLODIPINE BESYLATE 10 MG PO TABS
10.0000 mg | ORAL_TABLET | Freq: Every day | ORAL | Status: DC
Start: 2015-10-05 — End: 2015-10-05
  Administered 2015-10-05: 10 mg via ORAL
  Filled 2015-10-05: qty 1

## 2015-10-05 MED ORDER — TECHNETIUM TC 99M SESTAMIBI GENERIC - CARDIOLITE
10.0000 | Freq: Once | INTRAVENOUS | Status: AC | PRN
  Administered 2015-10-05: 10 via INTRAVENOUS

## 2015-10-05 MED ORDER — REGADENOSON 0.4 MG/5ML IV SOLN
0.4000 mg | Freq: Once | INTRAVENOUS | Status: AC
  Administered 2015-10-05: 0.4 mg via INTRAVENOUS
  Filled 2015-10-05: qty 5

## 2015-10-05 MED ORDER — REGADENOSON 0.4 MG/5ML IV SOLN
INTRAVENOUS | Status: AC
  Administered 2015-10-05: 0.4 mg via INTRAVENOUS
  Filled 2015-10-05: qty 5

## 2015-10-05 MED ORDER — POTASSIUM CHLORIDE CRYS ER 20 MEQ PO TBCR: 40.0000 meq | EXTENDED_RELEASE_TABLET | Freq: Once | ORAL | Status: DC

## 2015-10-05 MED ORDER — TECHNETIUM TC 99M SESTAMIBI GENERIC - CARDIOLITE
30.0000 | Freq: Once | INTRAVENOUS | Status: AC | PRN
  Administered 2015-10-05: 30 via INTRAVENOUS

## 2015-10-05 MED ORDER — AMLODIPINE BESYLATE 10 MG PO TABS: 10.0000 mg | ORAL_TABLET | Freq: Every day | ORAL | Status: DC

## 2015-10-05 MED ORDER — ACETAMINOPHEN 325 MG PO TABS
ORAL_TABLET | ORAL | Status: AC
Start: 2015-10-05 — End: 2015-10-05
  Administered 2015-10-05: 10:00:00
  Filled 2015-10-05: qty 2

## 2015-10-05 NOTE — Discharge Summary (Signed)
Discharge Summary    Patient ID: [name],  MRN: [id number], DOB/AGE: 80-Oct-1926 80 y.o.  Admit date: 10/04/2015 Discharge date: 10/05/2015  Primary Care Provider: Nyoka Cowden Primary Cardiologist: Dr Johnsie Cancel  Discharge Diagnoses    Principal Problem:   Syncope Active Problems:   Bradycardia with less than 60 beats per minute   Allergies Allergies  Allergen Reactions  . Lisinopril Anaphylaxis    Tongue swelling  . Labetalol Hcl Other (See Comments)    Caused bradycardia and syncope    Diagnostic Studies/Procedures    Lexiscan MV _____________   History of Present Illness      [name] is a 82 year old female with a past medical history of HTN, HLD, hypothyroidism, CVA (left parietal in Dec. 2015). No prior CAD. Echo done in Dec. 2015 showed EF of 60-65%, severe ulcerated plaque in mid to distal aortic arch. Seen by Dr Johnsie Cancel pre-op for cerebral angiogram to eval L-ICA stenosis seen on [place].   She was started on labetalol for better BP control. She took the first dose and a few hours later, was hypotensive and bradycardic, had syncope. She came to the hospital and was admitted for further evaluation and treatment.    Hospital Course     Consultants: None   Her symptoms gradually improved. She was felt to be very sensitive to the labetalol, now listed as an allergy. She had a Myoview to make sure there was no ischemic component to her symptoms. Results are below, it was without scar or ischemia, EF normal.   Her potassium was low on admission and was supplemented. This will be checked as an outpatient.   She was followed on telemetry and her HR was maintained once the medication had worn off. Her Norvasc was held due to the hypotension on admission, but will be restarted at discharge.  On 03/30, she was seen by Dr Tamala Julian and all data were reviewed. No further inpatient workup is indicated and she is considered stable for discharge, to followup as an  outpatient. She will keep her appointment for labs in a week and then follow up with a provider in 4 weeks. _____________  Discharge Vitals Blood pressure 131/67, pulse 62, temperature 98 F (36.7 C), temperature source Oral, resp. rate 18, height 5\' 6"  (1.676 m), weight 149 lb 9.6 oz (67.858 kg), SpO2 96 %.  Filed Weights   10/04/15 1647 10/05/15 0533  Weight: 151 lb 3.2 oz (68.584 kg) 149 lb 9.6 oz (67.858 kg)    Labs & Radiologic Studies    CBC  Recent Labs  10/04/15 1011 10/04/15 1446  WBC  --  6.2  NEUTROABS  --  4.7  HGB 13.3 11.3*  HCT 39.0 33.4*  MCV  --  86.5  PLT  --  99991111   Basic Metabolic Panel  Recent Labs  10/04/15 1006  10/04/15 1446 10/05/15 0400  NA  --   < > 131* 132*  K  --   < > 2.9* 3.3*  CL  --   < > 95* 96*  CO2  --   --  26 26  GLUCOSE  --   < > 169* 100*  BUN  --   < > 15 15  CREATININE  --   < > 1.25* 1.34*  CALCIUM  --   --  8.5* 8.6*  MG 2.1  --   --   --   < > = values in this interval not  displayed.  Thyroid Function Tests  Recent Labs  10/05/15 0400  TSH 2.443   _____________  Dg Chest 2 View 10/04/2015  CLINICAL DATA:  Syncope.  Hypertension. EXAM: CHEST  2 VIEW COMPARISON:  Chest radiograph Nov 17, 2012; chest CT Nov 26, 2012 FINDINGS: There is relative hyperexpansion of the lungs. There is a nodular opacity in the left mid lung measuring 1.0 x 0.8 cm. There are scattered areas of scarring bilaterally. No edema or consolidation. Heart size and pulmonary vascularity are normal. No adenopathy. No bone lesions. IMPRESSION: 1.0 x 0.8 cm nodular opacity left mid lung. Advise noncontrast enhanced chest CT to further assess. Lungs somewhat hyperexpanded with scattered areas of scarring. No edema or consolidation. Electronically Signed   By: Lowella Grip III M.D.   On: 10/04/2015 15:04   Nm Myocar Multi W/spect W/wall Motion / Ef 10/05/2015   There was no ST segment deviation noted during stress.  Defect 1: There is a medium defect  of mild severity present in the mid anteroseptal, mid inferoseptal and apical septal location.  This is a low risk study.  The study is normal.  The left ventricular ejection fraction is normal (55-65%).  Nuclear stress EF: 57%.  Low risk stress nuclear study with LBBB-related perfusion artifact, otherwise normal perfusion and normal left ventricular regional and global systolic function.   Disposition   Pt is being discharged home today in good condition.  Follow-up Plans & Appointments    Follow-up Information    Follow up with Ermalinda Barrios, PA-C On 10/30/2015.   Specialty:  Cardiology   Why:  See provider at 11:45 am, please arrive 15 minutes early for paperwork.   Contact information:   [address] [place] [address] [phone]      Discharge Instructions    Diet - low sodium heart healthy    Complete by:  As directed      Increase activity slowly    Complete by:  As directed            Discharge Medications   Current Discharge Medication List    CONTINUE these medications which have CHANGED   Details  amLODipine (NORVASC) 10 MG tablet Take 1 tablet (10 mg total) by mouth daily at 12 noon. Qty: 30 tablet, Refills: 11      CONTINUE these medications which have NOT CHANGED   Details  aspirin 325 MG tablet Take 325 mg by mouth daily.    atorvastatin (LIPITOR) 80 MG tablet TAKE 1 TABLET BY MOUTH EVERY DAY AT 6PM Qty: 90 tablet, Refills: 1    levothyroxine (SYNTHROID, LEVOTHROID) 75 MCG tablet TAKE 1 TABLET BY MOUTH EVERY DAY Qty: 90 tablet, Refills: 1      STOP taking these medications     hydrochlorothiazide (HYDRODIURIL) 25 MG tablet          Outstanding Labs/Studies   None  Duration of Discharge Encounter   Greater than 30 minutes including physician time.  Jonetta Speak NP 10/05/2015, 1:59 PM

## 2015-10-05 NOTE — Progress Notes (Signed)
     The patient was seen in nuclear medicine for a Lexiscan myoview. She tolerated the procedure well. No acute ST or TW changes on ECG.    Arbutus Leas, NP

## 2015-10-05 NOTE — Care Management Obs Status (Signed)
[place] NOTIFICATION   Patient Details  Name: [name] MRN: [id number] Date of Birth: Jan 15, 1925   Medicare Observation Status Notification Given:  Yes (chest pain)    Bethena Roys, RN 10/05/2015, 2:17 PM

## 2015-10-05 NOTE — Discharge Instructions (Signed)
Keep appointment for labs on 10/11/2015 at 9:45 am.

## 2015-10-05 NOTE — Progress Notes (Signed)
Patient Name: [name] Date of Encounter: 10/05/2015  Principal Problem:   Syncope Active Problems:   Bradycardia with less than 60 beats per minute   Patient Profile: [name] is a 82 year old female with a past medical history of HTN, HLD, hypothyroidism, CVA (left parietal in Dec. 2015). No prior CAD. Echo done in Dec. 2015 showed EF of 60-65%, severe ulcerated plaque in mid to distal aortic arch. She had an episode of syncope and weakness on 10/04/15 following an initial dose of labetalol.   SUBJECTIVE: She feels well this morning.  Denies chest pain, SOB, weakness.    OBJECTIVE Filed Vitals:   10/05/15 0533 10/05/15 0900 10/05/15 0916 10/05/15 0918  BP:  168/72 164/56 152/62  Pulse:  81 90 85  Temp:      TempSrc:      Resp:      Height:      Weight: 149 lb 9.6 oz (67.858 kg)     SpO2:        Intake/Output Summary (Last 24 hours) at 10/05/15 0919 Last data filed at 10/04/15 1808  Gross per 24 hour  Intake    240 ml  Output      0 ml  Net    240 ml   Filed Weights   10/04/15 1647 10/05/15 0533  Weight: 151 lb 3.2 oz (68.584 kg) 149 lb 9.6 oz (67.858 kg)    PHYSICAL EXAM General: Well developed, well nourished, elderly female in no acute distress. Head: Normocephalic, atraumatic.  Neck: Supple without bruits,No JVD. Lungs:  Resp regular and unlabored, CTA. Heart: RRR, S1, S2, no S3, S4, or murmur; no rub. Abdomen: Soft, non-tender, non-distended, BS + x 4.  Extremities: No clubbing, cyanosis,No edema.  Neuro: Alert and oriented X 3. Moves all extremities spontaneously. Psych: Normal affect.  LABS: CBC: Recent Labs  10/04/15 1011 10/04/15 1446  WBC  --  6.2  NEUTROABS  --  4.7  HGB 13.3 11.3*  HCT 39.0 33.4*  MCV  --  86.5  PLT  --  99991111   Basic Metabolic Panel: Recent Labs  10/04/15 1006  10/04/15 1446 10/05/15 0400  NA  --   < > 131* 132*  K  --   < > 2.9* 3.3*  CL  --   < > 95* 96*  CO2  --   --  26 26  GLUCOSE  --   < > 169*  100*  BUN  --   < > 15 15  CREATININE  --   < > 1.25* 1.34*  CALCIUM  --   --  8.5* 8.6*  MG 2.1  --   --   --   < > = values in this interval not displayed.   Recent Labs  10/04/15 1010  TROPIPOC 0.01   Thyroid Function Tests: Recent Labs  10/05/15 0400  TSH 2.443     Current facility-administered medications:  .  acetaminophen (TYLENOL) tablet 650 mg, 650 mg, Oral, Q4H PRN, Arbutus Leas, NP .  amLODipine (NORVASC) tablet 5 mg, 5 mg, Oral, Daily, Belva Crome, MD .  aspirin EC tablet 81 mg, 81 mg, Oral, Daily, Arbutus Leas, NP .  atorvastatin (LIPITOR) tablet 80 mg, 80 mg, Oral, Daily, Arbutus Leas, NP, 80 mg at 10/04/15 1803 .  heparin injection 5,000 Units, 5,000 Units, Subcutaneous, 3 times per day, Arbutus Leas, NP, 5,000 Units at 10/05/15 [phone] .  levothyroxine (SYNTHROID,  LEVOTHROID) tablet 75 mcg, 75 mcg, Oral, QAC breakfast, Arbutus Leas, NP, 75 mcg at 10/05/15 0748 .  ondansetron (ZOFRAN) injection 4 mg, 4 mg, Intravenous, Q6H PRN, Arbutus Leas, NP .  potassium chloride SA (K-DUR,KLOR-CON) CR tablet 40 mEq, 40 mEq, Oral, TID, Erma Heritage, [place], 40 mEq at 10/04/15 2202    TELE:  NSR      ECG: NSR with LBBB  Radiology/Studies: Dg Chest 2 View  10/04/2015  CLINICAL DATA:  Syncope.  Hypertension. EXAM: CHEST  2 VIEW COMPARISON:  Chest radiograph Nov 17, 2012; chest CT Nov 26, 2012 FINDINGS: There is relative hyperexpansion of the lungs. There is a nodular opacity in the left mid lung measuring 1.0 x 0.8 cm. There are scattered areas of scarring bilaterally. No edema or consolidation. Heart size and pulmonary vascularity are normal. No adenopathy. No bone lesions. IMPRESSION: 1.0 x 0.8 cm nodular opacity left mid lung. Advise noncontrast enhanced chest CT to further assess. Lungs somewhat hyperexpanded with scattered areas of scarring. No edema or consolidation. Electronically Signed   By: Lowella Grip III M.D.   On: 10/04/2015 15:04     Current Medications:   . amLODipine  5 mg Oral Daily  . aspirin EC  81 mg Oral Daily  . atorvastatin  80 mg Oral Daily  . heparin  5,000 Units Subcutaneous 3 times per day  . levothyroxine  75 mcg Oral QAC breakfast  . potassium chloride  40 mEq Oral TID      ASSESSMENT AND PLAN: Principal Problem:   Syncope Active Problems:   Bradycardia with less than 60 beats per minute   1. Syncope- No further episodes. Most likely related to the labetalol.  She is having [place] today.   2. Bradycardia- Resolved. HR 60's over night.   3. Probable critical left carotid stenosis- She has left carotid disease, and carotid endarterectomy versus stenting is being contemplated.  4. Significant essential hypertension- Norvasc held last night, will resume today as her BP has been elevated.    Signed, Arbutus Leas , NP 9:19 AM 10/05/2015 Pager [phone] The patient has been seen in conjunction with Jettie Booze, NP. All aspects of care have been considered and discussed. The patient has been personally interviewed, examined, and all clinical data has been reviewed.   Negative markers. General increase in HR.  Nuclear results pending.  Notified Dr. Bridgett Larsson that she was in hospital.  Amlodipine 10 mg daily for BP.  Replete potassium  Home later today if tolerates amlodipine okay.

## 2015-10-06 ENCOUNTER — Telehealth: Payer: Self-pay | Admitting: Cardiovascular Disease

## 2015-10-06 ENCOUNTER — Telehealth: Payer: Self-pay | Admitting: Vascular Surgery

## 2015-10-06 ENCOUNTER — Other Ambulatory Visit: Payer: Self-pay | Admitting: *Deleted

## 2015-10-06 ENCOUNTER — Telehealth: Payer: Self-pay

## 2015-10-06 DIAGNOSIS — Z8673 Personal history of transient ischemic attack (TIA), and cerebral infarction without residual deficits: Secondary | ICD-10-CM

## 2015-10-06 DIAGNOSIS — R55 Syncope and collapse: Secondary | ICD-10-CM

## 2015-10-06 MED ORDER — HYDRALAZINE HCL 25 MG PO TABS: 25.0000 mg | ORAL_TABLET | Freq: Three times a day (TID) | ORAL | Status: DC

## 2015-10-06 NOTE — Telephone Encounter (Signed)
Pt's grandaughter called with questions.  Reported pt. Has elevated BP of 198/88 @ 2:30 PM today.  Reported the elev. BP to the Cardiology office;  Was advised to start Hydralazine 25 mg. BID.  Reported that her BP came down to 179/90 after 2 hrs.  Ques. At what point should the pt. Go to the ER with the elevated BP.  Reported that the pt. Is not having any symptoms.  Advised she should ask the Cardiologist for parameters.  Grandaughter also voiced concern about pt. Undergoing the CTA of neck on Monday, 4/3.  Stated that since her Creatinine is elevated, she questions if the pt. Is at risk for going into Renal failure.  Advised that if the CR is mildly elevated, they may hydrate the pt. Prior to the study.  Advised the grandaughter, if the family is not comfortable with going through with the CTA Neck at this time, they should postpone the study.  Grandaughter verb. Understanding.  Reported her mother was calling the Cardiology office at this time to get further recommendations for BP management, and they would decide on the CTA neck on Monday morning.

## 2015-10-06 NOTE — Telephone Encounter (Signed)
Follow Up:   She said she just finished talking to you,she needs you again.She wants to know if the pt can come by now to get her blood pressure check? I made it is a high priority,because she wants to come by now.

## 2015-10-06 NOTE — Telephone Encounter (Signed)
Patient's BP 190/88 HR 74 after taking amlodpine 10 mg. Patient is asymptomatic at this time. Encouraged daughter to have patient lay down and try to relax for now. Will inform Dr. Johnsie Cancel and call back with advisement.

## 2015-10-06 NOTE — Telephone Encounter (Signed)
Informed daughter that our office does not except walk-ins. Informed her that if she needs to be seen, to go to urgent care. Informed her that patient could go to [facility] or somewhere like it, and have her BP check.   Dr. Johnsie Cancel recommended patient to start hydralazine 25 mg by mouth twice daily at first and see if that helps her BP. Then if she can increase it to three times a day.

## 2015-10-06 NOTE — Telephone Encounter (Signed)
New message      Pt c/o BP issue: STAT if pt c/o blurred vision, one-sided weakness or slurred speech  1. What are your last 5 BP readings? 198/75 then a few minutes later it was 188/75  2. Are you having any other symptoms (ex. Dizziness, headache, blurred vision, passed out)? no 3. What is your BP issue? bp is high today.  Pt will take her bp medications soon and will take another reading.  Is there anything else she needs to tod

## 2015-10-06 NOTE — Telephone Encounter (Signed)
New message      1 hour after taking bp medication, patient's bp is 179/90.  Talk to the nurse about guidelines for the weekend

## 2015-10-06 NOTE — Telephone Encounter (Signed)
Gave daughter CTA appointment 10/09/15 1:20 pm 301. Verbalized understanding.

## 2015-10-06 NOTE — Telephone Encounter (Signed)
Discussed starting hydralazine

## 2015-10-06 NOTE — Telephone Encounter (Signed)
Called patient's daughter back. Informed her that patient should take her second dose of hydralazine when she goes to bed, if her BP is still high. Patient's HR 77. Patient is resting at the moment and is asymptomatic. Patient's daughter verbalized understanding. Informed patient's daughter we would like to see patient's BP below 140/90 and HR 60-100. Encouraged patient's daughter to call with any questions or concerns. Informed her of signs and symptoms of a stroke and if patient has any of these to call 911. Patient's daughter verbalized understanding.

## 2015-10-06 NOTE — Telephone Encounter (Signed)
New message      Pt c/o medication issue:  1. Name of Medication: hydralazine 2. How are you currently taking this medication (dosage and times per day)? 25mg  3. Are you having a reaction (difficulty breathing--STAT)? no 4. What is your medication issue?  Calling to see if pt should take a pill now and tonight or start tonight.  Her bp is 190/80

## 2015-10-06 NOTE — Telephone Encounter (Signed)
Called patient's daughter back. Informed her that patient could take one hydralazine now. Informed her that patient could check her BP again this evening around 8:00, and if BP is still up she can take another tablet. If BP is doing better this evening to take a 25 mg tablet in the morning and then in the evening. Patient's daughter verbalized understanding.

## 2015-10-07 ENCOUNTER — Telehealth: Payer: Self-pay | Admitting: Cardiology

## 2015-10-07 NOTE — Telephone Encounter (Signed)
Patient called due to increased BP.  Her BP 172/43mmHg.  Her daughter gave her xanax and Hydralazine 25mg  just now.  Instructed daughter to continue to follow BP and if elevated in the am call back.  She agrees with plan

## 2015-10-07 NOTE — Telephone Encounter (Signed)
Pt's daughter called- her mothers B/P running high- [id number] systolic despite the addition of hydralazine 25 mg TID. I told her she can take an extra hydralazine 25 mg if needed once a day. Give Norvasc 10 mg a little more time. If her B/P is not controlled tomorrow consider increasing hydralazine to 50 mg TID.  Kerin Ransom PA-C 10/07/2015 1:26 PM

## 2015-10-09 ENCOUNTER — Ambulatory Visit
Admission: RE | Admit: 2015-10-09 | Discharge: 2015-10-09 | Disposition: A | Discharge status: Home / Self Care | Payer: Medicare Other | Source: Ambulatory Visit | Attending: Vascular Surgery | Admitting: Vascular Surgery

## 2015-10-09 ENCOUNTER — Telehealth: Payer: Self-pay | Admitting: Cardiovascular Disease

## 2015-10-09 ENCOUNTER — Telehealth: Payer: Self-pay | Admitting: Internal Medicine

## 2015-10-09 ENCOUNTER — Encounter (HOSPITAL_COMMUNITY): Payer: Medicare Other

## 2015-10-09 DIAGNOSIS — Z8673 Personal history of transient ischemic attack (TIA), and cerebral infarction without residual deficits: Secondary | ICD-10-CM

## 2015-10-09 DIAGNOSIS — R55 Syncope and collapse: Secondary | ICD-10-CM

## 2015-10-09 MED ORDER — IOPAMIDOL (ISOVUE-370) INJECTION 76%
100.0000 mL | Freq: Once | INTRAVENOUS | Status: AC | PRN
  Administered 2015-10-09: 100 mL via INTRAVENOUS

## 2015-10-09 MED ORDER — HYDRALAZINE HCL 25 MG PO TABS: 50.0000 mg | ORAL_TABLET | Freq: Three times a day (TID) | ORAL | Status: DC

## 2015-10-09 MED ORDER — ALPRAZOLAM 0.5 MG PO TABS: 0.5000 mg | ORAL_TABLET | Freq: Every day | ORAL | Status: DC | PRN

## 2015-10-09 NOTE — Telephone Encounter (Signed)
Called patient's daughter back about patient's BP over the weekend. Patient's BP this am is 174/80, HR 97. Patient is asystematic, and states "I feel fine." Patient is taking hydralazine 25 mg TID. Kerin Ransom PA suggested patient might need to start hydralazine 50 mg TID. Will forward to Dr. Johnsie Cancel for advisement.

## 2015-10-09 NOTE — Telephone Encounter (Signed)
New message      Pt c/o BP issue: STAT if pt c/o blurred vision, one-sided weakness or slurred speech  1. What are your last 5 BP readings? Sat 178/74 10am, 185/79 12:20, took extra rx 7:30pm 172/85; sun 2:30pm 186/83 pulse 92, 4:00 179/82 pulse 90 2. Are you having any other symptoms (ex. Dizziness, headache, blurred vision, passed out)? no  3. What is your BP issue? Having carotid CT at 1pm today---daughter is concerned about her bp.  Please call after 9

## 2015-10-09 NOTE — Telephone Encounter (Signed)
Pt was in the hospital and daughter is BP has been 185/79 low 163/77 this morning 174/80 daughter would like to know if her mother should have the scan this afternoon at 1:00pm? Stage 3 kidney disease per Cardiology. And need Rx for Xanax .5  Concern about the Creatinine levels. Change of her BP medicine by Cardiology and bottom out on Saturday.

## 2015-10-09 NOTE — Telephone Encounter (Signed)
Discussed pt with Dr.K, okay to call in Rx for Alprazolam 0.5 mg one tablet daily prn for anxiety. Rx called into pharmacy.

## 2015-10-09 NOTE — Telephone Encounter (Signed)
Ok to increase hydralazine to 50 tid starting W[place]

## 2015-10-09 NOTE — Telephone Encounter (Signed)
Called patient's daughter back with Dr. Kyla Balzarine message to increase hydralazine to 50 mg TID starting on Wednesday. Patient's daughter verbalized understanding.

## 2015-10-09 NOTE — Telephone Encounter (Signed)
Spoke to pt' daughter needing a Rx for Alprazolam pt is anxious and was told by Cardiology to call PCP for Rx. Also pt scheduled for CT scan. Mick Sell I will discuss with Dr.K and send Rx to the pharmacy. [name] verbalized understanding.

## 2015-10-10 ENCOUNTER — Telehealth: Payer: Self-pay | Admitting: Cardiovascular Disease

## 2015-10-10 NOTE — Telephone Encounter (Signed)
Called patient daughter [name] [facility]). Informed her that her stress test in the hospital showed low risk study and patient's EF 57%. [name] verbalized understanding and stated patient's SBP was down to 150. Patient will continue with current medication regiment.

## 2015-10-10 NOTE — Telephone Encounter (Signed)
[name](daughter) is calling to get the results of her Mother stress test she had last Thursday at the hospital . Please call   Thanks

## 2015-10-11 ENCOUNTER — Telehealth: Payer: Self-pay | Admitting: Cardiovascular Disease

## 2015-10-11 ENCOUNTER — Other Ambulatory Visit: Payer: Medicare Other

## 2015-10-11 NOTE — Telephone Encounter (Signed)
Called patient's daughter [facility]) about patient's BP. Patient has not started her hydralazine 50 mg TID yet. Informed patient's daughter to start on the 50 mg this evening. Patient is superstitious of making changes on Wednesday, due to past events. Encouraged patient's daughter to have patient start medication this evening, and stay with patient for about an hour after she takes medication to make sure her BP doesn't drop too low. Patient's daughter verbalized understanding.

## 2015-10-11 NOTE — Telephone Encounter (Signed)
New message      Pt c/o BP issue: STAT if pt c/o blurred vision, one-sided weakness or slurred speech  1. What are your last 5 BP readings? 166/?? And 174/??? 2. Are you having any other symptoms (ex. Dizziness, headache, blurred vision, passed out)? Nothing new 3. What is your BP issue? Top number on bp reading is still high.  Please call

## 2015-10-12 ENCOUNTER — Telehealth: Payer: Self-pay | Admitting: Cardiovascular Disease

## 2015-10-12 ENCOUNTER — Telehealth: Payer: Self-pay | Admitting: Vascular Surgery

## 2015-10-12 MED ORDER — CLONIDINE HCL 0.1 MG PO TABS: 0.1000 mg | ORAL_TABLET | Freq: Two times a day (BID) | ORAL | Status: DC

## 2015-10-12 NOTE — Telephone Encounter (Signed)
sched appt for 10/25/15 at 8:45.  Lm on hm# to inform pt of appt.

## 2015-10-12 NOTE — Telephone Encounter (Signed)
Start clonidine .1 mg bid

## 2015-10-12 NOTE — Telephone Encounter (Signed)
Follow up   Pt is returning rn call

## 2015-10-12 NOTE — Telephone Encounter (Signed)
Left message to call back  

## 2015-10-12 NOTE — Telephone Encounter (Signed)
Instructed patient to START CLONIDINE 0.1 mg BID. She will check her BP daily and call next week with results. Patient was grateful for call.

## 2015-10-12 NOTE — Telephone Encounter (Signed)
Returned patient's daughter's phone call. She st her mother's BP is running in the 170s/80s this AM.   Called patient. She reports her BP has been running 160s-180s/80s for the last week, regardless of whether she has taken medication or time of day.  She has no complaints - denies CP, SOB, HA, swelling. She reports she started the increased dosage of hydralazine early this AM - she will now be taking 50 mg TID. She reports BP did not improve with larger dose. Instructed patient to go ahead and take her Norvasc (she takes this at noon), and to call if symptoms arise. She is going to go play Bridge and awaits Dr. Kyla Balzarine recommendations when she returns this afternoon.

## 2015-10-12 NOTE — Telephone Encounter (Signed)
New message     Pt c/o BP issue: STAT if pt c/o blurred vision, one-sided weakness or slurred speech  1. What are your last 5 BP readings? Before rx 171/85 pulse 95, 89min after taking rx 173/82 HR 86 2. Are you having any other symptoms (ex. Dizziness, headache, blurred vision, passed out)? Nothing new 3. What is your BP issue? Calling to give bp update

## 2015-10-12 NOTE — Telephone Encounter (Signed)
-----   Message from Denman George, RN sent at 10/12/2015  9:04 AM EDT ----- Regarding: add on to [facility] schedule 4/19 Contact: [phone] See his message below. ----- Message -----    From: Conrad Parcelas Nuevas, MD    Sent: 10/12/2015   8:09 AM      To: Denman George, RN Subject: RE: CTA Neck                                   I need to see her in the office on the add on Wednesday.  Can move out any none urgent vein patient to accommodate this patient.  She is going to be high risk CEA, so need to talk to her in person.   ----- Message -----    From: Denman George, RN    Sent: 10/11/2015   4:16 PM      To: Conrad [place], MD Subject: CTA Neck                                       Pt's. daughter called with request for CTA neck results;done 4/3.  Questioned if they need to make an appt. to come back to discuss, or if you will discuss results over the phone.  Please advise.

## 2015-10-17 ENCOUNTER — Telehealth: Payer: Self-pay | Admitting: Cardiovascular Disease

## 2015-10-17 NOTE — Telephone Encounter (Signed)
Pt currently taking Hydralazine 50mg  TID, Amlodipine 10mg  QD and Clonidine 0.1mg  BID (Clonidine started on 4/6). Pt states her BP has been running 137-150/60-80 since starting Clonidine.  Denies dizziness, lightheadedness, SOB or CP and does not feel like she is going to pass out. Pt states that she feel ok but she does feel fatigued since starting the Clonidine. Advised pt that this is a side effect of this medication that may resolve once her body adjusts to the Clonidine. Advised pt to continue meds and monitoring BP and call back on Friday with readings and let us know if the fatigue has improved. Also advised pt that I would send this message to Dr. Johnsie Cancel for review and would call with any recommendations he has prior to Friday, if any. Pt verbalized understanding and was in agreement with this plan.

## 2015-10-17 NOTE — Telephone Encounter (Signed)
New Message:  Pt is calling in to speak with the nurse about her progress while on the new medications prescribed by Dr.Nishan: Hydralazine, Clonidine,Amlodipine. Please f/u with her

## 2015-10-18 ENCOUNTER — Encounter: Payer: Self-pay | Admitting: Vascular Surgery

## 2015-10-18 NOTE — Telephone Encounter (Signed)
Talked to patient about her BP. Patient stated her BP was 150/68 this morning and this afternoon BP 187/83 HR 81. Patient stated she already took her hydralazine 50 mg and Clonidine 0.1 mg this morning, amlodipine 10 mg this afternoon, and hydralazine 50 mg at 2:00 pm. Patient stated after she took her afternoon BP she took a Xanax 0.5 mg. Encouraged patient to check her BP again after taking the Xanax, if BP is still up, she can go ahead and take her evening dose of clonidine 0.1 mg. Patient will wait until bedtime to take her hydralazine 50 mg. Patient verbalized understanding. Patient's daughter called right after patient did. Informed patient's daughter of our conversation. Patient's daughter stated she would go check on her mother after she gets off of work.

## 2015-10-18 NOTE — Telephone Encounter (Signed)
Left message for patient to call back  

## 2015-10-19 NOTE — Telephone Encounter (Signed)
Patient called about her BP. This morning  166/80 and this afternoon 171/88 with HR in 80s. Patient stated she is very anxious about her BP. Encouraged patient to try to relax and take it easy. Patient stated she might take a Xanax, this might be helpful for her anxiety. Offered patient to see Elberta Leatherwood with the BP clinic. Patient agreed to come in to BP clinic.

## 2015-10-20 ENCOUNTER — Ambulatory Visit (INDEPENDENT_AMBULATORY_CARE_PROVIDER_SITE_OTHER): Payer: Medicare Other | Admitting: Pharmacist

## 2015-10-20 ENCOUNTER — Encounter (INDEPENDENT_AMBULATORY_CARE_PROVIDER_SITE_OTHER): Payer: Medicare Other

## 2015-10-20 VITALS — BP 170/68 | HR 76

## 2015-10-20 DIAGNOSIS — I10 Essential (primary) hypertension: Secondary | ICD-10-CM

## 2015-10-20 NOTE — Progress Notes (Signed)
Patient ID: LATARRA MAHE, female   DOB: 08-13-1924, 82 y.o.   MRN: [id number]     Cardiology Office Note   Date:  10/20/2015   ID:  LEWANNA MEDEARIS, DOB 1924-09-15, MRN [id number]  PCP:  Nyoka Cowden, MD  Cardiologist:   Jenkins Rouge, MD  Chief Complaint  Patient presents with  . Hypertension      History of Present Illness: Shagun WILLAMENA KHALIFA is a 82 y.o. female patient of Dr. Johnsie Cancel who was referred to the HTN clinic.  She has a PMH significant for stroke, carotid disease, PVD, HTN and hyperlipidemia.  She was seen by Dr. Johnsie Cancel on 10/03/15 for preop evaluation prior to carotid angiogram that was planned on 10/05/15.  Pt's  BP was elevated at that visit so Dr. Johnsie Cancel stopped her HCTZ, increased amlodipine to 10mg  daily and added labetalol 300mg  BID.  On 3/29 after her first dose of labetalol, pt had a syncopal episode and was admitted to the hospital.  She had bradycardia and so labetalol was stopped.  3/31 she called with elevated BP and hydralazine 25mg  BID was started.  On 4/1, she called again with elevated BP and suggested she take a Xanax.  On 4/3, another phone call was made and so she was instructed to increase her hydralazine to 50mg  TID on 4/5.  On 4/6, she called back with elevated BP again.  She had only had 1 dose of the hydralazine 50mg  TID.  Clonidine 0.1mg  BID was added.  She notified us on 4/11 the clonidine was making her feel fatigued and then on 4/13 she was offered a follow up appointment in the HTN clinic.  During all of these events pt had no symptoms of elevated BP including dizziness, HA, blurred vision.  She is extremely anxious about her recent carotid studies and the risk of having another stroke.  Her only complaint today is that she feels shaky and weak in the legs.  She did state that taking Xanax and drinking Propel water helps improve the shakiness.    Pt takes her BP at least 2 times per day.  She does practice good monitoring technique.  Her lows are in the  130s and highs in the 180s since starting clonidine.  Average SBP in the 160s.  There is no pattern to when she is high/low compared to when she takes her medications or activities for the day.    Past Medical History  Diagnosis Date  . [place] DISEASE, LUMBAR 12/16/2008  . DIVERTICULOSIS, COLON 09/30/2008  . DYSPNEA 07/13/2008  . HYPERLIPIDEMIA 03/06/2007  . HYPERTENSION 03/06/2007  . HYPOTHYROIDISM 10/13/2007  . OSTEOARTHRITIS 03/06/2007  . Personal history of colonic polyps 09/30/2008  . TOBACCO USE, QUIT 04/12/2009  . WEAKNESS 11/09/2007  . Graves disease   . Aortic arch atherosclerosis ([place]) 06/24/2014  . Atherosclerotic ulcer of aorta ([place]) 06/24/2014  . History of embolic stroke [id number]    Left brain    Past Surgical History  Procedure Laterality Date  . Cataract extraction    . Knee surgery    . Cholecystectomy    . Cardiac catheterization    . Tee without cardioversion N/A 06/24/2014    Procedure: TRANSESOPHAGEAL ECHOCARDIOGRAM (TEE);  Surgeon: Sanda Klein, MD;  Location: [facility] ENDOSCOPY;  Service: Cardiovascular;  Laterality: N/A;     Current Outpatient Prescriptions  Medication Sig Dispense Refill  . ALPRAZolam (XANAX) 0.5 MG tablet Take 1 tablet (0.5 mg total) by mouth daily as needed for anxiety. 30 tablet  1  . amLODipine (NORVASC) 10 MG tablet Take 1 tablet (10 mg total) by mouth daily at 12 noon. 30 tablet 11  . aspirin 325 MG tablet Take 325 mg by mouth daily.    [name] Kitchen atorvastatin (LIPITOR) 80 MG tablet TAKE 1 TABLET BY MOUTH EVERY DAY AT 6PM (Patient taking differently: TAKE 1 TABLET BY MOUTH daily) 90 tablet 1  . cloNIDine (CATAPRES) 0.1 MG tablet Take 1 tablet (0.1 mg total) by mouth 2 (two) times daily. 60 tablet 11  . hydrALAZINE (APRESOLINE) 25 MG tablet Take 2 tablets (50 mg total) by mouth 3 (three) times daily.    [name] Kitchen levothyroxine (SYNTHROID, LEVOTHROID) 75 MCG tablet TAKE 1 TABLET BY MOUTH EVERY DAY 90 tablet 1   No current facility-administered medications for this  visit.    Allergies:   Lisinopril and Labetalol hcl    Social History:  The patient  reports that she quit smoking about 37 years ago. She has never used smokeless tobacco. She reports that she drinks about 4.2 oz of alcohol per week. She reports that she does not use illicit drugs.   Family History:  The patient's family history includes Cancer in her maternal grandmother.    ASSESSMENT AND PLAN:  1. Hypertension- Pt's BP elevated in clinic today.  Reviewed old OV.  BP was well controlled prior to diagnosis of carotid disease and need for pre-op clearance.  Pt is very nervous and fearful of having another stroke.  This anxiety is likely contributing to her elevated readings and she is very concerned about lowering her BP and Xanax does seem to relieve her symptoms.  Unsure what her BP truly is throughout the day.  One option is to have patient wear a 24 hour BP monitor to determine if anxiety is having any role in her elevated pressures.  She will have one placed today and will follow up next week.  Also discussed decreasing frequency of BP monitoring.  Pt's daughter agreed to check BP for her mom so she does not know the results to help with the anxiety.     Cyndee Brightly [facility]  10/20/2015 1:42 PM    [facility] [address], [place], [place]  [address] Phone: [phone]; Fax: [phone]

## 2015-10-23 ENCOUNTER — Emergency Department (HOSPITAL_COMMUNITY): Payer: Medicare Other

## 2015-10-23 ENCOUNTER — Emergency Department (HOSPITAL_COMMUNITY)
Admission: EM | Admit: 2015-10-23 | Discharge: 2015-10-23 | Disposition: A | Discharge status: Home / Self Care | Payer: Medicare Other | Attending: Emergency Medicine | Admitting: Emergency Medicine

## 2015-10-23 ENCOUNTER — Ambulatory Visit: Payer: Medicare Other | Admitting: Cardiovascular Disease

## 2015-10-23 ENCOUNTER — Telehealth: Payer: Self-pay | Admitting: Cardiovascular Disease

## 2015-10-23 ENCOUNTER — Encounter (HOSPITAL_COMMUNITY): Payer: Self-pay

## 2015-10-23 DIAGNOSIS — R6883 Chills (without fever): Secondary | ICD-10-CM | POA: Insufficient documentation

## 2015-10-23 DIAGNOSIS — Z7982 Long term (current) use of aspirin: Secondary | ICD-10-CM | POA: Diagnosis not present

## 2015-10-23 DIAGNOSIS — E785 Hyperlipidemia, unspecified: Secondary | ICD-10-CM | POA: Diagnosis not present

## 2015-10-23 DIAGNOSIS — Z87891 Personal history of nicotine dependence: Secondary | ICD-10-CM | POA: Insufficient documentation

## 2015-10-23 DIAGNOSIS — Z8719 Personal history of other diseases of the digestive system: Secondary | ICD-10-CM | POA: Insufficient documentation

## 2015-10-23 DIAGNOSIS — M542 Cervicalgia: Secondary | ICD-10-CM | POA: Insufficient documentation

## 2015-10-23 DIAGNOSIS — F419 Anxiety disorder, unspecified: Secondary | ICD-10-CM | POA: Diagnosis not present

## 2015-10-23 DIAGNOSIS — G44209 Tension-type headache, unspecified, not intractable: Secondary | ICD-10-CM

## 2015-10-23 DIAGNOSIS — Z86010 Personal history of colonic polyps: Secondary | ICD-10-CM | POA: Diagnosis not present

## 2015-10-23 DIAGNOSIS — Z79899 Other long term (current) drug therapy: Secondary | ICD-10-CM | POA: Insufficient documentation

## 2015-10-23 DIAGNOSIS — M1990 Unspecified osteoarthritis, unspecified site: Secondary | ICD-10-CM | POA: Insufficient documentation

## 2015-10-23 DIAGNOSIS — Z9889 Other specified postprocedural states: Secondary | ICD-10-CM | POA: Insufficient documentation

## 2015-10-23 DIAGNOSIS — E039 Hypothyroidism, unspecified: Secondary | ICD-10-CM | POA: Diagnosis not present

## 2015-10-23 DIAGNOSIS — I10 Essential (primary) hypertension: Secondary | ICD-10-CM | POA: Diagnosis not present

## 2015-10-23 DIAGNOSIS — R531 Weakness: Secondary | ICD-10-CM | POA: Diagnosis present

## 2015-10-23 DIAGNOSIS — Z8673 Personal history of transient ischemic attack (TIA), and cerebral infarction without residual deficits: Secondary | ICD-10-CM | POA: Insufficient documentation

## 2015-10-23 LAB — CBC WITH DIFFERENTIAL/PLATELET
BASOS ABS: 0.1 10*3/uL (ref 0.0–0.1)
Basophils Relative: 2 %
EOS ABS: 0.1 10*3/uL (ref 0.0–0.7)
Eosinophils Relative: 1 %
HCT: 35.8 % — ABNORMAL LOW (ref 36.0–46.0)
HEMOGLOBIN: 11.8 g/dL — AB (ref 12.0–15.0)
Lymphocytes Relative: 17 %
Lymphs Abs: 0.8 10*3/uL (ref 0.7–4.0)
MCH: 29.4 pg (ref 26.0–34.0)
MCHC: 33 g/dL (ref 30.0–36.0)
MCV: 89.1 fL (ref 78.0–100.0)
Monocytes Absolute: 0.5 10*3/uL (ref 0.1–1.0)
Monocytes Relative: 10 %
NEUTROS ABS: 3.2 10*3/uL (ref 1.7–7.7)
NEUTROS PCT: 70 %
Platelets: 193 10*3/uL (ref 150–400)
RBC: 4.02 MIL/uL (ref 3.87–5.11)
RDW: 13.7 % (ref 11.5–15.5)
WBC: 4.6 10*3/uL (ref 4.0–10.5)

## 2015-10-23 LAB — I-STAT TROPONIN, ED: Troponin i, poc: 0.02 ng/mL (ref 0.00–0.08)

## 2015-10-23 LAB — COMPREHENSIVE METABOLIC PANEL
ALK PHOS: 100 U/L (ref 38–126)
ALT: 17 U/L (ref 14–54)
AST: 20 U/L (ref 15–41)
Albumin: 3.6 g/dL (ref 3.5–5.0)
Anion gap: 10 (ref 5–15)
BUN: 11 mg/dL (ref 6–20)
CALCIUM: 9 mg/dL (ref 8.9–10.3)
CHLORIDE: 102 mmol/L (ref 101–111)
CO2: 22 mmol/L (ref 22–32)
CREATININE: 1.17 mg/dL — AB (ref 0.44–1.00)
GFR calc non Af Amer: 40 mL/min — ABNORMAL LOW (ref >60)
GFR, EST AFRICAN AMERICAN: 47 mL/min — AB (ref >60)
GLUCOSE: 128 mg/dL — AB (ref 65–99)
Potassium: 3.7 mmol/L (ref 3.5–5.1)
SODIUM: 134 mmol/L — AB (ref 135–145)
Total Bilirubin: 0.8 mg/dL (ref 0.3–1.2)
Total Protein: 6.5 g/dL (ref 6.5–8.1)

## 2015-10-23 LAB — URINALYSIS, ROUTINE W REFLEX MICROSCOPIC
BILIRUBIN URINE: NEGATIVE
GLUCOSE, UA: NEGATIVE mg/dL
HGB URINE DIPSTICK: NEGATIVE
KETONES UR: NEGATIVE mg/dL
Leukocytes, UA: NEGATIVE
NITRITE: NEGATIVE
PH: 6.5 (ref 5.0–8.0)
Protein, ur: NEGATIVE mg/dL
SPECIFIC GRAVITY, URINE: 1.008 (ref 1.005–1.030)

## 2015-10-23 MED ORDER — KETOROLAC TROMETHAMINE 30 MG/ML IJ SOLN
15.0000 mg | Freq: Once | INTRAMUSCULAR | Status: AC
Start: 2015-10-23 — End: 2015-10-23
  Administered 2015-10-23: 15 mg via INTRAVENOUS
  Filled 2015-10-23: qty 1

## 2015-10-23 MED ORDER — CYCLOBENZAPRINE HCL 10 MG PO TABS
5.0000 mg | ORAL_TABLET | Freq: Once | ORAL | Status: AC
  Administered 2015-10-23: 5 mg via ORAL
  Filled 2015-10-23: qty 1

## 2015-10-23 NOTE — Telephone Encounter (Signed)
I left a message for [name] at [phone] stating that per Dr Johnsie Cancel she should contact the pts PCP as her s/s are not heart related. I also left this office's phone number for the pt to call us back.

## 2015-10-23 NOTE — Telephone Encounter (Signed)
Please call asap,pt is not feeling well, She might need to go to the hospital..

## 2015-10-23 NOTE — ED Notes (Signed)
Patient transported to X-ray 

## 2015-10-23 NOTE — Telephone Encounter (Signed)
Talked to patient's daughter [name]. [name] wanted to make sure Dr. Johnsie Cancel was aware that Patient was in the ER and that they turned in BP Monitor. Informed [name] that the ER doctor will run some test and will make decisions on her BP medications according to results. Informed her that patient should follow-up with her PCP for anxiety. Patient's daughter agrees with plan.

## 2015-10-23 NOTE — Telephone Encounter (Signed)
Left message to call back  

## 2015-10-23 NOTE — Telephone Encounter (Signed)
New message      Calling to let the doctor know that pt has been transported to  via EMS because she was complaining of shaky, weak and not feeling good.

## 2015-10-23 NOTE — Discharge Instructions (Signed)
Tension Headache A tension headache is a feeling of pain, pressure, or aching that is often felt over the front and sides of the head. The pain can be dull, or it can feel tight (constricting). Tension headaches are not normally associated with nausea or vomiting, and they do not get worse with physical activity. Tension headaches can last from 30 minutes to several days. This is the most common type of headache. CAUSES The exact cause of this condition is not known. Tension headaches often begin after stress, anxiety, or depression. Other triggers may include:  Alcohol.  Too much caffeine, or caffeine withdrawal.  Respiratory infections, such as colds, flu, or sinus infections.  Dental problems or teeth clenching.  Fatigue.  Holding your head and neck in the same position for a long period of time, such as while using a computer.  Smoking. SYMPTOMS Symptoms of this condition include:  A feeling of pressure around the head.  Dull, aching head pain.  Pain felt over the front and sides of the head.  Tenderness in the muscles of the head, neck, and shoulders. DIAGNOSIS This condition may be diagnosed based on your symptoms and a physical exam. Tests may be done, such as a CT scan or an MRI of your head. These tests may be done if your symptoms are severe or unusual. TREATMENT This condition may be treated with lifestyle changes and medicines to help relieve symptoms. HOME CARE INSTRUCTIONS Managing Pain  Take over-the-counter and prescription medicines only as told by your health care provider.  Lie down in a dark, quiet room when you have a headache.  If directed, apply ice to the head and neck area:  Put ice in a plastic bag.  Place a towel between your skin and the bag.  Leave the ice on for 20 minutes, 2-3 times per day.  Use a heating pad or a hot shower to apply heat to the head and neck area as told by your health care provider. Eating and Drinking  Eat meals on  a regular schedule.  Limit alcohol use.  Decrease your caffeine intake, or stop using caffeine. General Instructions  Keep all follow-up visits as told by your health care provider. This is important.  Keep a headache journal to help find out what may trigger your headaches. For example, write down:  What you eat and drink.  How much sleep you get.  Any change to your diet or medicines.  Try massage or other relaxation techniques.  Limit stress.  Sit up straight, and avoid tensing your muscles.  Do not use tobacco products, including cigarettes, chewing tobacco, or e-cigarettes. If you need help quitting, ask your health care provider.  Exercise regularly as told by your health care provider.  Get 7-9 hours of sleep, or the amount recommended by your health care provider. SEEK MEDICAL CARE IF:  Your symptoms are not helped by medicine.  You have a headache that is different from what you normally experience.  You have nausea or you vomit.  You have a fever. SEEK IMMEDIATE MEDICAL CARE IF:  Your headache becomes severe.  You have repeated vomiting.  You have a stiff neck.  You have a loss of vision.  You have problems with speech.  You have pain in your eye or ear.  You have muscular weakness or loss of muscle control.  You lose your balance or you have trouble walking.  You feel faint or you pass out.  You have confusion.     This information is not intended to replace advice given to you by your health care provider. Make sure you discuss any questions you have with your health care provider.   Document Released: 06/24/2005 Document Revised: 03/15/2015 Document Reviewed: 10/17/2014 Elsevier Interactive Patient Education 2016 [employer] Inc[employer]  

## 2015-10-23 NOTE — ED Notes (Signed)
Pt. BIB [facility] for evaluation of nausea and generalized weakness x1 week. Pt. Recently diagnosed with R carotid blockage with follow up appointment this week. Pt. Complaint of R neck pain since yesterday. Pt. AxO x4, pt. Ambulatory to ems stretcher with no issues. Pt. Had syncopal episode 2 weeks ago and seen here, change to bp med at that time.

## 2015-10-23 NOTE — Telephone Encounter (Signed)
New message      Daughter states pt is shaky, very weak, neck hurts and does not feel good.  Bp now is 178/79 pulse 82.  Daughter thinks pt may need to go to the ER.  Please advise

## 2015-10-23 NOTE — Telephone Encounter (Addendum)
**Note De-Identified  Obfuscation** Per the pts daughter, [name], the pt is shaky, weak, nauseated, cold and feels awful. She reports that the pts BP is 179/79 with a HR of 79. She states that she is ready to take the pt to the ER because she feels so bad. She is advised that I will speak to Dr Johnsie Cancel and call her right back. She stated "well Im getting ready to take her to the ER so please call me back at [phone]".

## 2015-10-23 NOTE — ED Provider Notes (Signed)
CSN: [id number]     Arrival date & time 10/23/15  [id number] History   First MD Initiated Contact with Patient 10/23/15 [phone]     Chief Complaint  Patient presents with  . Weakness  . Neck Pain     (Consider location/radiation/quality/duration/timing/severity/associated sxs/prior Treatment) Patient is a 82 y.o. female presenting with weakness and neck pain.  Weakness Associated symptoms include chills, neck pain and weakness. Pertinent negatives include no abdominal pain, chest pain, coughing, fatigue, fever, headaches or numbness.  Neck Pain Associated symptoms: weakness   Associated symptoms: no chest pain, no fever, no headaches, no numbness and no photophobia    This patient is a 82 year old female presents with complaints of weakness, chills. She reports this been going on for approximately one week. Just prior to these symptoms, the patient began taking clonidine. There were no other medication changes immediately prior to the symptoms starting. Over the past several weeks the patient has been constantly changing her blood pressure medications after initiating care with a new cardiologist, after finding she had a critical 85% stenosis of her left internal carotid artery. She is trying to get a stress test to determine whether she would be a candidate for endarterectomy. She was admitted the hospital approximately 2 weeks ago after starting labetalol and having hypotension.  Since these particular symptoms started a week ago, she has not been having any dizziness, lightheadedness or near syncopal episodes. She has been monitoring her blood pressure at home several times a day, and just completed a 24-hour blood pressure monitoring test, returning the cough this morning.  She denies any shortness of breath, but has had a mild cough for several days. She has not had any fevers, but describes constant sensations of chills.  Past Medical History  Diagnosis Date  . [place] DISEASE, LUMBAR 12/16/2008   . DIVERTICULOSIS, COLON 09/30/2008  . DYSPNEA 07/13/2008  . HYPERLIPIDEMIA 03/06/2007  . HYPERTENSION 03/06/2007  . HYPOTHYROIDISM 10/13/2007  . OSTEOARTHRITIS 03/06/2007  . Personal history of colonic polyps 09/30/2008  . TOBACCO USE, QUIT 04/12/2009  . WEAKNESS 11/09/2007  . Graves disease   . Aortic arch atherosclerosis ([place]) 06/24/2014  . Atherosclerotic ulcer of aorta ([place]) 06/24/2014  . History of embolic stroke [id number]    Left brain   Past Surgical History  Procedure Laterality Date  . Cataract extraction    . Knee surgery    . Cholecystectomy    . Cardiac catheterization    . Tee without cardioversion N/A 06/24/2014    Procedure: TRANSESOPHAGEAL ECHOCARDIOGRAM (TEE);  Surgeon: Sanda Klein, MD;  Location: [facility] ENDOSCOPY;  Service: Cardiovascular;  Laterality: N/A;   Family History  Problem Relation Age of Onset  . Cancer Maternal Grandmother     stomach   Social History  Substance Use Topics  . Smoking status: Former Smoker    Quit date: 07/08/1978  . Smokeless tobacco: Never Used  . Alcohol Use: 4.2 oz/week    7 Glasses of wine per week     Comment: "red wine"   OB History    No data available     Review of Systems  Constitutional: Positive for chills and activity change. Negative for fever and fatigue.  Eyes: Negative for photophobia and redness.  Respiratory: Negative for cough, choking and shortness of breath.   Cardiovascular: Negative for chest pain, palpitations and leg swelling.  Gastrointestinal: Negative for abdominal pain, diarrhea, constipation and blood in stool.  Musculoskeletal: Positive for neck pain.  Neurological: Positive for weakness. Negative for  light-headedness, numbness and headaches.  All other systems reviewed and are negative.     Allergies  Lisinopril and Labetalol hcl  Home Medications   Prior to Admission medications   Medication Sig Start Date End Date Taking? Authorizing Provider  ALPRAZolam Duanne Moron) 0.5 MG tablet Take 1  tablet (0.5 mg total) by mouth daily as needed for anxiety. 10/09/15  Yes Marletta Lor, MD  amLODipine (NORVASC) 10 MG tablet Take 1 tablet (10 mg total) by mouth daily at 12 noon. 10/05/15  Yes Rhonda G Barrett, PA-C  aspirin 325 MG tablet Take 325 mg by mouth daily.   Yes Historical Provider, MD  atorvastatin (LIPITOR) 80 MG tablet TAKE 1 TABLET BY MOUTH EVERY DAY AT 6PM Patient taking differently: TAKE 1 TABLET BY MOUTH daily 05/08/15  Yes Marletta Lor, MD  hydrALAZINE (APRESOLINE) 25 MG tablet Take 2 tablets (50 mg total) by mouth 3 (three) times daily. 10/09/15  Yes Josue Hector, MD  levothyroxine (SYNTHROID, LEVOTHROID) 75 MCG tablet TAKE 1 TABLET BY MOUTH EVERY DAY 05/01/15  Yes Marletta Lor, MD   BP 137/63 mmHg  Pulse 68  Temp(Src) 97.9 F (36.6 C) (Oral)  Resp 16  SpO2 95% Physical Exam  Constitutional: She is oriented to person, place, and time. She appears well-developed and well-nourished. No distress.  HENT:  Head: Normocephalic and atraumatic.  Eyes: Conjunctivae and EOM are normal. Pupils are equal, round, and reactive to light.  Neck: Neck supple. No JVD present.  Cardiovascular: Normal rate, regular rhythm and intact distal pulses.   Pulmonary/Chest: Effort normal and breath sounds normal. No respiratory distress. She has no wheezes.  Musculoskeletal: She exhibits tenderness (along paraspinal mucles of neck, to base of skull).  Neurological: She is alert and oriented to person, place, and time. No cranial nerve deficit. Coordination normal.  Skin: Skin is warm and dry.  Psychiatric: Her mood appears anxious.  Nursing note and vitals reviewed.   ED Course  Procedures (including critical care time) Labs Review Labs Reviewed  CBC WITH DIFFERENTIAL/PLATELET - Abnormal; Notable for the following:    Hemoglobin 11.8 (*)    HCT 35.8 (*)    All other components within normal limits  COMPREHENSIVE METABOLIC PANEL - Abnormal; Notable for the following:     Sodium 134 (*)    Glucose, Bld 128 (*)    Creatinine, Ser 1.17 (*)    GFR calc non Af Amer 40 (*)    GFR calc Af Amer 47 (*)    All other components within normal limits  URINALYSIS, ROUTINE W REFLEX MICROSCOPIC (NOT AT [facility])  Randolm Idol, ED    Imaging Review Dg Chest 2 View  10/23/2015  CLINICAL DATA:  Posterior neck pain for few days, fever EXAM: CHEST  2 VIEW COMPARISON:  10/04/2015, 11/17/2012 FINDINGS: The lungs are hyperinflated likely secondary to COPD. There is a 7 mm left mid lung pulmonary nodule. There is no focal parenchymal opacity. There is no pleural effusion or pneumothorax. The heart and mediastinal contours are unremarkable. The osseous structures are unremarkable. IMPRESSION: No active cardiopulmonary disease. 7 mm left mid lung pulmonary nodule which may reflect an area of scarring versus a true pulmonary nodule. Electronically Signed   By: Kathreen Devoid   On: 10/23/2015 10:29   I have personally reviewed and evaluated these images and lab results as part of my medical decision-making.   EKG Interpretation   Date/Time:  Monday October 23 2015 09:38:26 EDT Ventricular Rate:  62  PR Interval:  149 QRS Duration: 118 QT Interval:  386 QTC Calculation: 442 R Axis:   32 Text Interpretation:  Sinus rhythm LVH with secondary repolarization  abnormality Anterior infarct, old No significant change since last tracing  Confirmed by YAO  MD, DAVID ([id number]) on 10/23/2015 10:06:54 AM Also  confirmed by Darl Householder  MD, DAVID ([id number]), editor [place]  CCT, BEVERLY  ([id number])  on 10/23/2015 10:38:12 AM      MDM   Final diagnoses:  Essential hypertension  Tension headache    82 year old female presents complaining of weakness, neck pain, anxiety. Patient is afebrile, has normal vital signs, with the exception of some mild hypertension that resolved on the ED. All of her symptoms began following the initiation of clonidine. She has been feeling cold, and chills, but has no signs  of infection, normal urinalysis, no leukocytosis, and no hyperthermia or fever. Her neck pain is reproducible with palpation of her paraspinal muscles to the base of her skull, is not associated with torticollis, has had no trauma to her neck, and is relieved with ibuprofen, consistent with musculoskeletal, tension headache.  All lab work obtained unremarkable, with no evidence of renal insufficiency, electrolyte disturbances, or leukocytosis. She is quite anxious, which seems to be driving a considerable amount of the symptoms. Due to their association with her clonidine and relatively good blood pressure control even 8 hours after her taking the clonidine, I have advised her to stop the clonidine, and to follow-up with her primary care doctor in the next 24-48 hours for medication readjustment if needed.    Leata Mouse, MD 10/23/15 [place], MD 10/23/15 [phone]

## 2015-10-24 ENCOUNTER — Telehealth: Payer: Self-pay | Admitting: Pharmacist

## 2015-10-24 NOTE — Telephone Encounter (Signed)
Reviewed pt's 24 hour ambulatory BP monitor results.  Avg BP was 143/64, awake BP 149/67 and asleep BP 127/57.  Discussed with Dr. Johnsie Cancel.  Her elevated readings are likely anxiety related.  She went to ER yesterday and they stopped clonidine.  Pt states she is feeling better than she has in weeks with this change.  She has a follow up with her PCP this week and an appt with our APP next week.

## 2015-10-25 ENCOUNTER — Encounter: Payer: Self-pay | Admitting: Vascular Surgery

## 2015-10-25 ENCOUNTER — Ambulatory Visit (INDEPENDENT_AMBULATORY_CARE_PROVIDER_SITE_OTHER): Payer: Medicare Other | Admitting: Vascular Surgery

## 2015-10-25 VITALS — BP 164/88 | HR 92 | Ht 66.0 in | Wt 150.4 lb

## 2015-10-25 DIAGNOSIS — I779 Disorder of arteries and arterioles, unspecified: Secondary | ICD-10-CM | POA: Diagnosis not present

## 2015-10-25 DIAGNOSIS — I739 Peripheral vascular disease, unspecified: Principal | ICD-10-CM

## 2015-10-25 NOTE — Progress Notes (Signed)
Established Carotid Patient  History of Present Illness  [name] is a 82 y.o. ([birth year]) female who presents with chief complaint: follow up on CTA. Pt has been cleared by Cardiology in the interval time period.  This patient previously had a L parietal stroke in Dec 2015. It was felt based on her work-up that she had embolic phenomena from her aortic arch. Reported outside carotid studies demonstrated a left high grade stenosis >80%. The patient was not referred to a vascular surgeon until recently.  Patient has known history of TIA: right hand weakness and paraesthesia. The patient has never had amaurosis fugax or monocular blindness. The patient has never had facial drooping or hemiplegia. The patient has never had receptive or expressive aphasia. he patient's previous neurologic deficits have resolved. The patient's risks factors for carotid disease include: HLD, HTN and prior smoker.    Past Medical History  Diagnosis Date  . [place] DISEASE, LUMBAR 12/16/2008  . DIVERTICULOSIS, COLON 09/30/2008  . DYSPNEA 07/13/2008  . HYPERLIPIDEMIA 03/06/2007  . HYPERTENSION 03/06/2007  . HYPOTHYROIDISM 10/13/2007  . OSTEOARTHRITIS 03/06/2007  . Personal history of colonic polyps 09/30/2008  . TOBACCO USE, QUIT 04/12/2009  . WEAKNESS 11/09/2007  . Graves disease   . Aortic arch atherosclerosis ([place]) 06/24/2014  . Atherosclerotic ulcer of aorta ([place]) 06/24/2014  . History of embolic stroke [id number]    Left brain    Past Surgical History  Procedure Laterality Date  . Cataract extraction    . Knee surgery    . Cholecystectomy    . Cardiac catheterization    . Tee without cardioversion N/A 06/24/2014    Procedure: TRANSESOPHAGEAL ECHOCARDIOGRAM (TEE);  Surgeon: Sanda Klein, MD;  Location: [facility] ENDOSCOPY;  Service: Cardiovascular;  Laterality: N/A;    Social History   Social History  . Marital Status: Divorced    Spouse Name: N/A  . Number of Children: 4  . Years of Education:  college   Occupational History  . Not on file.   Social History Main Topics  . Smoking status: Former Smoker    Quit date: 07/08/1978  . Smokeless tobacco: Never Used  . Alcohol Use: 4.2 oz/week    7 Glasses of wine per week     Comment: "red wine"  . Drug Use: No  . Sexual Activity: Not on file   Other Topics Concern  . Not on file   Social History Narrative   Patient is right handed.   Patient drinks 1 cup caffeine daily.    Family History  Problem Relation Age of Onset  . Cancer Maternal Grandmother     stomach     Current Outpatient Prescriptions  Medication Sig Dispense Refill  . ALPRAZolam (XANAX) 0.5 MG tablet Take 1 tablet (0.5 mg total) by mouth daily as needed for anxiety. 30 tablet 1  . amLODipine (NORVASC) 10 MG tablet Take 1 tablet (10 mg total) by mouth daily at 12 noon. 30 tablet 11  . aspirin 325 MG tablet Take 325 mg by mouth daily.    [name] Kitchen atorvastatin (LIPITOR) 80 MG tablet TAKE 1 TABLET BY MOUTH EVERY DAY AT 6PM (Patient taking differently: TAKE 1 TABLET BY MOUTH daily) 90 tablet 1  . hydrALAZINE (APRESOLINE) 25 MG tablet Take 2 tablets (50 mg total) by mouth 3 (three) times daily.    [name] Kitchen levothyroxine (SYNTHROID, LEVOTHROID) 75 MCG tablet TAKE 1 TABLET BY MOUTH EVERY DAY 90 tablet 1   No current facility-administered medications for this visit.  Allergies  Allergen Reactions  . Lisinopril Anaphylaxis    Tongue swelling  . Labetalol Hcl Other (See Comments)    Caused bradycardia and syncope    REVIEW OF SYSTEMS: (Positives checked otherwise negative)  CARDIOVASCULAR:  [ ]  chest pain,  [ ]  chest pressure,  [ ]  palpitations,  [ ]  shortness of breath when laying flat,  [ ]  shortness of breath with exertion,  [ ]  pain in feet when walking,  [ ]  pain in feet when laying flat, [ ]  history of blood clot in veins (DVT),  [ ]  history of phlebitis,  [ ]  swelling in legs,  [ ]  varicose veins  PULMONARY:  [ ]  productive cough,   [ ]  asthma,  [ ]  wheezing  NEUROLOGIC:  [ ]  weakness in arms or legs,  [ ]  numbness in arms or legs,  [ ]  difficulty speaking or slurred speech,  [ ]  temporary loss of vision in one eye,  [ ]  dizziness  HEMATOLOGIC:  [ ]  bleeding problems,  [ ]  problems with blood clotting too easily  MUSCULOSKEL:  [x]  joint pain, [ ]  joint swelling  GASTROINTEST:  [ ]  vomiting blood,  [ ]  blood in stool   GENITOURINARY:  [ ]  burning with urination,  [ ]  blood in urine  PSYCHIATRIC:  [ ]  history of major depression  INTEGUMENTARY:  [ ]  rashes,  [ ]  ulcers  CONSTITUTIONAL:  [ ]  fever,  [ ]  chills    Physical Examination  Filed Vitals:   10/25/15 0846  Height: 5\' 6"  (1.676 m)  Weight: 150 lb 6.4 oz (68.221 kg)   Body mass index is 24.29 kg/(m^2).  General: A&O x 3, WDWN, appears younger than stated age  Head: [place]/AT  Ear/Nose/Throat: Hearing grossly intact, nares w/o erythema or drainage, oropharynx w/o Erythema/Exudate, Mallampati score: 3  Eyes: PERRLA, EOMI  Neck: Supple, no nuchal rigidity, no palpable LAD, on Sonosite evaluation: L ICA stenosis appears high  Pulmonary: Sym exp, good air movt, CTAB, no rales, rhonchi, & wheezing  Cardiac: RRR, Nl S1, S2, no Murmurs, rubs or gallops  Vascular: Vessel Right Left  Radial Palpable Palpable  Brachial Palpable Palpable  Carotid Palpable, without bruit Palpable, without bruit  Aorta Not palpable N/A  Femoral Palpable Palpable  Popliteal Not palpable Not palpable  PT Not Palpable Not Palpable  DP Not Palpable Not Palpable   Gastrointestinal: soft, NTND, -G/R, - HSM, - masses, - CVAT B  Musculoskeletal: M/S 5/5 throughout , Extremities without ischemic changes   Neurologic: CN 2-12 intact , Pain and light touch intact in extremities , Motor exam as listed above  Psychiatric: Judgment intact, Mood & affect appropriate for pt's clinical  situation  Dermatologic: See M/S exam for extremity exam, no rashes otherwise noted  Lymph : No Cervical, Axillary, or Inguinal lymphadenopathy   Laboratory: CBC:    Component Value Date/Time   WBC 4.6 10/23/2015 1011   RBC 4.02 10/23/2015 1011   HGB 11.8* 10/23/2015 1011   HCT 35.8* 10/23/2015 1011   PLT 193 10/23/2015 1011   MCV 89.1 10/23/2015 1011   MCH 29.4 10/23/2015 1011   MCHC 33.0 10/23/2015 1011   RDW 13.7 10/23/2015 1011   LYMPHSABS 0.8 10/23/2015 1011   MONOABS 0.5 10/23/2015 1011   EOSABS 0.1 10/23/2015 1011   BASOSABS 0.1 10/23/2015 1011    BMP:    Component Value Date/Time   NA 134* 10/23/2015 1011   NA 138 09/13/2015 1103   K 3.7  10/23/2015 1011   CL 102 10/23/2015 1011   CO2 22 10/23/2015 1011   GLUCOSE 128* 10/23/2015 1011   GLUCOSE 143* 09/13/2015 1103   BUN 11 10/23/2015 1011   BUN 23 09/13/2015 1103   CREATININE 1.17* 10/23/2015 1011   CALCIUM 9.0 10/23/2015 1011   GFRNONAA 40* 10/23/2015 1011   GFRAA 47* 10/23/2015 1011    Coagulation: No results found for: INR, PROTIME No results found for: PTT  Lipids:    Component Value Date/Time   CHOL 184 02/15/2015 1122   TRIG 109.0 02/15/2015 1122   HDL 66.70 02/15/2015 1122   CHOLHDL 3 02/15/2015 1122   VLDL 21.8 02/15/2015 1122   LDLCALC 96 02/15/2015 1122   LDLDIRECT 141.7 05/01/2011 1037   CTA Neck (10/09/15) Advanced atherosclerotic plaque of the thoracic aorta and distal aortic arch.  50% diameter stenosis proximal right internal carotid artery  Irregular plaque of the distal left common carotid artery with plaque ulceration. Very irregular plaque involving the proximal left internal carotid artery. Critical 85% stenosis of the proximal left internal carotid artery  Mild atherosclerotic disease in the right vertebral artery without significant vertebral artery stenosis.  Heterogeneous thyroid bilaterally with 11 mm right lower pole nodule. Recommend thyroid  ultrasound.   Non-Invasive Vascular Imaging  L CAROTID DUPLEX (Date: 09/29/2015):   L ICA stenosis: 80-99% (long lesion)  L VA: patent and antegrade   Medical Decision Making  Pierrette KADIDIATOU CAVASOS is a 82 y.o. female who presents with: sx L ICA stenosis >80%, asx R ICA stenosis, extensive aortic arch atherosclerosis, labile HTN   This patient has had multiple complications from recent BP regimen titration, leading to admission and ED visit.  I would defer any intervention until BP stabilizes to limit the risk of post-operative hypo/hypertension resulting in watershed infarction and hemorrhagic stroke.  Based on the patient's vascular studies and examination, I have offered the patient: L CEA.  I discussed with the patient the risks, benefits, and alternatives to carotid endarterectomy.    The patient is NOT a candidate for carotid artery stenting due the severity of her ICA and aortic arch disease.  I discussed the procedural details of carotid endarterectomy with the patient.    The patient is aware that the risks of carotid endarterectomy include but are not limited to: bleeding, infection, stroke, myocardial infarction, death, cranial nerve injuries both temporary and permanent, neck hematoma, possible airway compromise, labile blood pressure post-operatively, cerebral hyperperfusion syndrome, and possible need for additional interventions in the future.   I discussed with her risk for periprocedural CVA are elevated due to the extent and severity of her disease and her labile BP which might make BP titration difficult.  The patient is aware of the risks and agrees to proceed forward with the procedure.  She is scheduled for the 8 MAY 17.  I discussed in depth with the patient the nature of atherosclerosis, and emphasized the importance of maximal medical management including strict control of blood pressure, blood glucose, and lipid levels, antiplatelet agents, obtaining regular  exercise, and cessation of smoking.    The patient is aware that without maximal medical management the underlying atherosclerotic disease process will progress, limiting the benefit of any interventions.  The patient is currently on a statin: Lipitor.  The patient is currently on an anti-platelet: ASA.  Thank you for allowing [place] to participate in this patient's care.   Adele Barthel, MD [facility] Office: [phone] Pager: [phone]  10/25/2015,  8:48 AM

## 2015-10-26 ENCOUNTER — Telehealth: Payer: Self-pay

## 2015-10-26 NOTE — Telephone Encounter (Signed)
Left message for patient to call back about her BP monitor results. Per Dr. Johnsie Cancel, BP was mildly increased while awake, and BP normal at night time.

## 2015-10-26 NOTE — Telephone Encounter (Signed)
Patient aware of results.

## 2015-10-27 ENCOUNTER — Other Ambulatory Visit: Payer: Self-pay | Admitting: Internal Medicine

## 2015-10-27 ENCOUNTER — Ambulatory Visit (INDEPENDENT_AMBULATORY_CARE_PROVIDER_SITE_OTHER): Payer: Medicare Other | Admitting: Internal Medicine

## 2015-10-27 ENCOUNTER — Encounter: Payer: Self-pay | Admitting: Internal Medicine

## 2015-10-27 VITALS — BP 166/70 | HR 89 | Temp 97.8°F | Resp 20 | Ht 66.0 in | Wt 150.0 lb

## 2015-10-27 DIAGNOSIS — R55 Syncope and collapse: Secondary | ICD-10-CM | POA: Diagnosis not present

## 2015-10-27 DIAGNOSIS — I779 Disorder of arteries and arterioles, unspecified: Secondary | ICD-10-CM

## 2015-10-27 DIAGNOSIS — I10 Essential (primary) hypertension: Secondary | ICD-10-CM

## 2015-10-27 DIAGNOSIS — E02 Subclinical iodine-deficiency hypothyroidism: Secondary | ICD-10-CM | POA: Diagnosis not present

## 2015-10-27 DIAGNOSIS — R001 Bradycardia, unspecified: Secondary | ICD-10-CM

## 2015-10-27 DIAGNOSIS — R911 Solitary pulmonary nodule: Secondary | ICD-10-CM | POA: Insufficient documentation

## 2015-10-27 DIAGNOSIS — E041 Nontoxic single thyroid nodule: Secondary | ICD-10-CM

## 2015-10-27 DIAGNOSIS — I739 Peripheral vascular disease, unspecified: Secondary | ICD-10-CM

## 2015-10-27 DIAGNOSIS — R7302 Impaired glucose tolerance (oral): Secondary | ICD-10-CM | POA: Insufficient documentation

## 2015-10-27 NOTE — Progress Notes (Signed)
Subjective:    Patient ID: [name], female    DOB: 1924/11/22, 82 y.o.   MRN: [id number]  HPI  82 year old patient who is seen today in follow-up.  She was hospitalized briefly due to syncope related to hypotension. She has been evaluated by neurology/vascular surgery and does have a high-grade left ICA stenosis with plaque ulceration.  She is scheduled for elective surgery.  After adequate blood pressure control She has been seen by cardiology who has been assisting with blood pressure control.  She has had a preoperative stress test.  Medical records reviewed and revealed  impaired glucose tolerance CTA of the neck also revealed a thyroid nodule and biopsy was recommended.  The patient is status post RAI in 2006 and is on supplemental levothyroxine. Chest x-ray also revealed a possible left mid pulmonary nodule. She states her blood pressure  Has  generally been better controlled.  She states in the morning systolic blood pressures are usually around 140 but often are [id number] systolic.  Later in the afternoon.  Diastolic blood pressures are usually in the 70 range.  She generally feels well today  Thiazide diuretic therapy has been discontinued due to elevated creatinine.  This has subsequently improved.    Past Medical History  Diagnosis Date  . [place] DISEASE, LUMBAR 12/16/2008  . DIVERTICULOSIS, COLON 09/30/2008  . DYSPNEA 07/13/2008  . HYPERLIPIDEMIA 03/06/2007  . HYPERTENSION 03/06/2007  . HYPOTHYROIDISM 10/13/2007  . OSTEOARTHRITIS 03/06/2007  . Personal history of colonic polyps 09/30/2008  . TOBACCO USE, QUIT 04/12/2009  . WEAKNESS 11/09/2007  . Graves disease   . Aortic arch atherosclerosis ([place]) 06/24/2014  . Atherosclerotic ulcer of aorta ([place]) 06/24/2014  . History of embolic stroke [id number]    Left brain     Social History   Social History  . Marital Status: Divorced    Spouse Name: N/A  . Number of Children: 4  . Years of Education: college   Occupational  History  . Not on file.   Social History Main Topics  . Smoking status: Former Smoker    Quit date: 07/08/1978  . Smokeless tobacco: Never Used  . Alcohol Use: 4.2 oz/week    7 Glasses of wine per week     Comment: "red wine"  . Drug Use: No  . Sexual Activity: Not on file   Other Topics Concern  . Not on file   Social History Narrative   Patient is right handed.   Patient drinks 1 cup caffeine daily.    Past Surgical History  Procedure Laterality Date  . Cataract extraction    . Knee surgery    . Cholecystectomy    . Cardiac catheterization    . Tee without cardioversion N/A 06/24/2014    Procedure: TRANSESOPHAGEAL ECHOCARDIOGRAM (TEE);  Surgeon: Sanda Klein, MD;  Location: [facility] ENDOSCOPY;  Service: Cardiovascular;  Laterality: N/A;    Family History  Problem Relation Age of Onset  . Cancer Maternal Grandmother     stomach    Allergies  Allergen Reactions  . Catapres [Clonidine Hcl] Other (See Comments)    Made pt feel horrible, shaky, weak and nausea  . Lisinopril Anaphylaxis    Tongue swelling  . Labetalol Hcl Other (See Comments)    Caused bradycardia and syncope    Current Outpatient Prescriptions on File Prior to Visit  Medication Sig Dispense Refill  . ALPRAZolam (XANAX) 0.5 MG tablet Take 1 tablet (0.5 mg total) by mouth daily as needed for anxiety. [place]  tablet 1  . amLODipine (NORVASC) 10 MG tablet Take 1 tablet (10 mg total) by mouth daily at 12 noon. 30 tablet 11  . aspirin 325 MG tablet Take 325 mg by mouth daily.    [name] Kitchen atorvastatin (LIPITOR) 80 MG tablet TAKE 1 TABLET BY MOUTH EVERY DAY AT 6PM (Patient taking differently: TAKE 1 TABLET BY MOUTH daily) 90 tablet 1  . hydrALAZINE (APRESOLINE) 25 MG tablet Take 2 tablets (50 mg total) by mouth 3 (three) times daily.     No current facility-administered medications on file prior to visit.    BP 166/70 mmHg  Pulse 89  Temp(Src) 97.8 F (36.6 C) (Oral)  Resp 20  Ht 5\' 6"  (1.676 m)  Wt 150 lb (68.04  kg)  BMI 24.22 kg/m2  SpO2 98%     Review of Systems  Constitutional: Negative.   HENT: Negative for congestion, dental problem, hearing loss, rhinorrhea, sinus pressure, sore throat and tinnitus.   Eyes: Negative for pain, discharge and visual disturbance.  Respiratory: Negative for cough and shortness of breath.   Cardiovascular: Negative for chest pain, palpitations and leg swelling.  Gastrointestinal: Negative for nausea, vomiting, abdominal pain, diarrhea, constipation, blood in stool and abdominal distention.  Genitourinary: Negative for dysuria, urgency, frequency, hematuria, flank pain, vaginal bleeding, vaginal discharge, difficulty urinating, vaginal pain and pelvic pain.  Musculoskeletal: Negative for joint swelling, arthralgias and gait problem.  Skin: Negative for rash.  Neurological: Positive for headaches. Negative for dizziness, syncope, speech difficulty, weakness and numbness.  Hematological: Negative for adenopathy.  Psychiatric/Behavioral: Negative for behavioral problems, dysphoric mood and agitation. The patient is not nervous/anxious.        Objective:   Physical Exam  Constitutional: She is oriented to person, place, and time. She appears well-developed and well-nourished.  Blood pressure 160/70 in both arms  HENT:  Head: Normocephalic.  Right Ear: External ear normal.  Left Ear: External ear normal.  Mouth/Throat: Oropharynx is clear and moist.  Eyes: Conjunctivae and EOM are normal. Pupils are equal, round, and reactive to light.  Neck: Normal range of motion. Neck supple. No thyromegaly present.  Cardiovascular: Normal rate, regular rhythm, normal heart sounds and intact distal pulses.   Pulmonary/Chest: Effort normal and breath sounds normal.  Abdominal: Soft. Bowel sounds are normal. She exhibits no mass. There is no tenderness.  Musculoskeletal: Normal range of motion.  Lymphadenopathy:    She has no cervical adenopathy.  Neurological: She is  alert and oriented to person, place, and time.  Skin: Skin is warm and dry. No rash noted.  Psychiatric: She has a normal mood and affect. Her behavior is normal.          Assessment & Plan:  Hypertension.  Predominantly ISH.   No change in medical regime; will maintain off diuretic therapy at this time Critical left ICA stenosis with ulcerated plaque.  Surgical repair scheduled Thyroid nodule.  Will check thyroid ultrasound and consider fine-needle aspiration biopsy after full recovery from surgery Small pulmonary nodule.  Will consider follow-up chest CT Impaired glucose tolerance.  We'll check hemoglobin A1c at next visit  Return here in 3 months or as needed

## 2015-10-27 NOTE — Progress Notes (Signed)
Pre visit review using our clinic review tool, if applicable. No additional management support is needed unless otherwise documented below in the visit note. 

## 2015-10-27 NOTE — Progress Notes (Signed)
Cardiology Office Note    Date:  10/30/2015   ID:  [name], DOB 09-Nov-1924, MRN [id number]  PCP:  Nyoka Cowden, MD  Cardiologist:  Dr. Romero Belling hopsital follow up.   History of Present Illness:  [name] is a 82 y.o. female with a past medical history of labile HTN, HLD, hypothyroidism, CVA (left parietal in Dec. 2015), carotid artery diease w/high-grade left ICA stenosis with plaque ulceration, and pulmonary nodule who presents to clinic for post hospital follow up.   She is followed by vascular surgery (Dr. Bridgett Larsson) and was scheduled for cerebral angiogram. Doppler evaluation suggested a long LICA stenosis [id number].  She became concerned about the risk of stroke and asked to have the procedure postponed until after cardiac evaluation. She was seen by Dr. Johnsie Cancel on 10/03/15 for pre operative clearance for possible LCEA.She was started on labetalol and her Norvasc was increased for better blood pressure control. He also ordered a myoview for pre op clearance for her vascular procedure.   She took her first does of labetalol the following morning and shortly after began to feel weak, flushed and became very hot and had an episode of syncope. EMS was called and she was admitted 3/29-3/30/17 for work up. Her symptoms gradually improved. She was felt to be very sensitive to the labetalol with hypotension and bradycardia and now it is listed as an allergy. She had a Myoview during her admission on 10/03/15 which was low risk without scar or ischemia, EF normal. She was cleared for vascular surgery. It was felt that she should proceed with surgery as soon as possible. Diuretic therapy was discontinued due to mild renal insufficiency. She was discharged on amlodipine only for BP control.   Review of >20 phone notes reveal that she was having issues with BPs and trialed on hydralazine and clonidine. She did not tolerate the clonidine and this was discontinued after an ER visit.  She  saw Elberta Leatherwood Pharm D in the blood pressure clinic on 10/20/15. It was noted that her blood pressure issues started after she was diagnosed with carotid artery disease requiring surgery. This has caused her quite a bit of anxiety. A 24 hour BP monitor was placed which showed mildly elevated BPs when awake and normal BPs at night. Avg BP was 143/64, awake BP 149/67 and asleep BP 127/57.Dr. Johnsie Cancel and Gay Filler felt that her elevated BPs were related to anxiety.   Seen by Dr. Bridgett Larsson on 10/25/15. Plan is for L CEA on May 8th if her BP remains stable.   Today she presents to clinic for follow up. She has been very anxious about this impending surgery and her elevated BPs. No chest pain or SOB. No HA, dizziness or blurred vision. She sometimes gets some LE edema when she is sitting for a long time, especially when she pays bridge. No orthopnea or PND. No palpitations. No dizziness or syncope. No symptoms at all. Just anxious about getting this surgery done. Here with daughter. They request another heart monitor.    Past Medical History  Diagnosis Date  . [place] DISEASE, LUMBAR 12/16/2008  . DIVERTICULOSIS, COLON 09/30/2008  . DYSPNEA 07/13/2008  . HYPERLIPIDEMIA 03/06/2007  . HYPERTENSION 03/06/2007  . HYPOTHYROIDISM 10/13/2007  . OSTEOARTHRITIS 03/06/2007  . Personal history of colonic polyps 09/30/2008  . TOBACCO USE, QUIT 04/12/2009  . WEAKNESS 11/09/2007  . Graves disease   . Aortic arch atherosclerosis ([place]) 06/24/2014  . Atherosclerotic ulcer of aorta ([place])  06/24/2014  . History of embolic stroke [id number]    Left brain    Past Surgical History  Procedure Laterality Date  . Cataract extraction    . Knee surgery    . Cholecystectomy    . Cardiac catheterization    . Tee without cardioversion N/A 06/24/2014    Procedure: TRANSESOPHAGEAL ECHOCARDIOGRAM (TEE);  Surgeon: Sanda Klein, MD;  Location: [facility] ENDOSCOPY;  Service: Cardiovascular;  Laterality: N/A;    Current Medications: Outpatient  Prescriptions Prior to Visit  Medication Sig Dispense Refill  . ALPRAZolam (XANAX) 0.5 MG tablet Take 1 tablet (0.5 mg total) by mouth daily as needed for anxiety. 30 tablet 1  . amLODipine (NORVASC) 10 MG tablet Take 1 tablet (10 mg total) by mouth daily at 12 noon. 30 tablet 11  . aspirin 325 MG tablet Take 325 mg by mouth daily.    [name] Kitchen atorvastatin (LIPITOR) 80 MG tablet TAKE 1 TABLET BY MOUTH EVERY DAY AT 6PM (Patient taking differently: TAKE 1 TABLET BY MOUTH daily) 90 tablet 1  . levothyroxine (SYNTHROID, LEVOTHROID) 75 MCG tablet TAKE 1 TABLET BY MOUTH EVERY DAY 90 tablet 1  . hydrALAZINE (APRESOLINE) 25 MG tablet Take 2 tablets (50 mg total) by mouth 3 (three) times daily.     No facility-administered medications prior to visit.     Allergies:   Catapres; Lisinopril; and Labetalol hcl   Social History   Social History  . Marital Status: Divorced    Spouse Name: N/A  . Number of Children: 4  . Years of Education: college   Social History Main Topics  . Smoking status: Former Smoker    Quit date: 07/08/1978  . Smokeless tobacco: Never Used  . Alcohol Use: 4.2 oz/week    7 Glasses of wine per week     Comment: "red wine"  . Drug Use: No  . Sexual Activity: Not on file   Other Topics Concern  . Not on file   Social History Narrative   Patient is right handed.   Patient drinks 1 cup caffeine daily.     Family History:  The patient's family history includes Cancer in her maternal grandmother.   ROS:   Please see the history of present illness.    ROS All other systems reviewed and are negative.   PHYSICAL EXAM:   VS:  BP 180/78 mmHg  Pulse 88  Ht 5\' 6"  (1.676 m)  Wt 149 lb 3.2 oz (67.677 kg)  BMI 24.09 kg/m2   GEN: Well nourished, well developed, in no acute distress HEENT: normal Neck: no JVD, carotid bruits, or masses Cardiac: RRR; no murmurs, rubs, or gallops,no edema  Respiratory:  clear to auscultation bilaterally, normal work of breathing GI: soft,  nontender, nondistended, + BS MS: no deformity or atrophy Skin: warm and dry, no rash Neuro:  Alert and Oriented x 3, Strength and sensation are intact Psych: euthymic mood, full affect  Wt Readings from Last 3 Encounters:  10/30/15 149 lb 3.2 oz (67.677 kg)  10/27/15 150 lb (68.04 kg)  10/25/15 150 lb 6.4 oz (68.221 kg)      Studies/Labs Reviewed:   EKG:  EKG is NOT ordered today.   Recent Labs: 10/04/2015: Magnesium 2.1 10/05/2015: TSH 2.443 10/23/2015: ALT 17; BUN 11; Creatinine, Ser 1.17*; Hemoglobin 11.8*; Platelets 193; Potassium 3.7; Sodium 134*   Lipid Panel    Component Value Date/Time   CHOL 184 02/15/2015 1122   TRIG 109.0 02/15/2015 1122   HDL 66.70 02/15/2015 1122  CHOLHDL 3 02/15/2015 1122   VLDL 21.8 02/15/2015 1122   LDLCALC 96 02/15/2015 1122   LDLDIRECT 141.7 05/01/2011 1037    Additional studies/ records that were reviewed today include:  Nm Myocar Multi W/spect W/wall Motion / Ef 10/05/2015  There was no ST segment deviation noted during stress.  Defect 1: There is a medium defect of mild severity present in the mid anteroseptal, mid inferoseptal and apical septal location.  This is a low risk study.  The study is normal.  The left ventricular ejection fraction is normal (55-65%).  Nuclear stress EF: 57%. Low risk stress nuclear study with LBBB-related perfusion artifact, otherwise normal perfusion and normal left ventricular regional and global systolic function.   CTA Neck (10/09/15) Advanced atherosclerotic plaque of the thoracic aorta and distal aortic arch.  50% diameter stenosis proximal right internal carotid artery  Irregular plaque of the distal left common carotid artery with plaque ulceration. Very irregular plaque involving the proximal left internal carotid artery. Critical 85% stenosis of the proximal left internal carotid artery  Mild atherosclerotic disease in the right vertebral artery without significant vertebral artery  stenosis.  Heterogeneous thyroid bilaterally with 11 mm right lower pole nodule. Recommend thyroid ultrasound.   Non-Invasive Vascular Imaging  L CAROTID DUPLEX (Date: 09/29/2015):   L ICA stenosis: 80-99% (long lesion)  L VA: patent and antegrade    ASSESSMENT:    1. Labile hypertension   2. Bilateral carotid artery disease ([place])   3. Hyperlipidemia   4. Pulmonary nodule   5. Hypothyroidism, unspecified hypothyroidism type   6. History of CVA (cerebrovascular accident)   7. Anxiety      PLAN:  In order of problems listed above:  Labile HTN: BP still elevated on Amlodipine 10 mg daily and hydralazine 50 mg 3 times a day. High blood pressure readings felt to be consistent with anxiety surrounding her impending vascular surgery. I will increase her hydralazine to 75mg  BID and have her see a pharmacist in the BP clinic this Friday. She requests another 24 hour BP monitor for peace of mind. I do not want to place now because she remains hypertensive today. When trying to arrange for BP monitor we found out that they were booked until 11/10/15.  Carotid artery diease w/high-grade left ICA stenosis with plaque ulceration: Scheduled for surgery 11/13/2015 with Dr. Bridgett Larsson. Recent Myoview was negative for ischemia and blood pressures have been under better control.  HLD: continue statin  Pulmonary nodule: followed closely by PCP  Hypothyroidism: Continue Synthroid  Previous CVA: Continue aspirin and statin.  Anxiety: Continue when necessary Xanax. I have recommended that she take this BID for better anxiety control. Hopefully we can get her BP under control so she can have the surgery and stop worrying. After that, she should try to taper off the xanax  Medication Adjustments/Labs and Tests Ordered: Current medicines are reviewed at length with the patient today.  Concerns regarding medicines are outlined above.  Medication changes, Labs and Tests ordered today are listed in the  Patient Instructions below. Patient Instructions  Medication Instructions:  Your physician has recommended you make the following change in your medication:  1. Increase hydralazine ( 75 mg ) three times a day    Labwork: -None  Testing/Procedures: -None  Follow-Up: Your physician recommends that you keep your scheduled afollow-up appointment with our pharmicist, Elberta Leatherwood- PharmD, on April 28.   Any Other Special Instructions Will Be Listed Below (If Applicable).     If  you need a refill on your cardiac medications before your next appointment, please call your pharmacy.       Renea Ee  10/30/2015 2:21 PM    [facility] [place], [place], [place]  [address] Phone: [phone]; Fax: [phone]

## 2015-10-27 NOTE — Patient Instructions (Signed)
Limit your sodium (Salt) intake  Return in 3 months for follow-up  Follow-up vascular surgery as scheduled  Follow-up cardiology as scheduled  Please check your blood pressure on a regular basis.  If it is consistently greater than 170/90, please make an office appointment.

## 2015-10-30 ENCOUNTER — Ambulatory Visit (INDEPENDENT_AMBULATORY_CARE_PROVIDER_SITE_OTHER): Payer: Medicare Other | Admitting: Physician Assistant

## 2015-10-30 ENCOUNTER — Encounter: Payer: Medicare Other | Admitting: Physician Assistant

## 2015-10-30 ENCOUNTER — Other Ambulatory Visit: Payer: Self-pay

## 2015-10-30 VITALS — BP 180/78 | HR 88 | Ht 66.0 in | Wt 149.2 lb

## 2015-10-30 DIAGNOSIS — E785 Hyperlipidemia, unspecified: Secondary | ICD-10-CM | POA: Diagnosis not present

## 2015-10-30 DIAGNOSIS — I10 Essential (primary) hypertension: Secondary | ICD-10-CM | POA: Diagnosis not present

## 2015-10-30 DIAGNOSIS — R911 Solitary pulmonary nodule: Secondary | ICD-10-CM

## 2015-10-30 DIAGNOSIS — R0989 Other specified symptoms and signs involving the circulatory and respiratory systems: Secondary | ICD-10-CM

## 2015-10-30 DIAGNOSIS — I739 Peripheral vascular disease, unspecified: Secondary | ICD-10-CM

## 2015-10-30 DIAGNOSIS — I779 Disorder of arteries and arterioles, unspecified: Secondary | ICD-10-CM | POA: Diagnosis not present

## 2015-10-30 DIAGNOSIS — E039 Hypothyroidism, unspecified: Secondary | ICD-10-CM

## 2015-10-30 DIAGNOSIS — Z8673 Personal history of transient ischemic attack (TIA), and cerebral infarction without residual deficits: Secondary | ICD-10-CM

## 2015-10-30 DIAGNOSIS — F419 Anxiety disorder, unspecified: Secondary | ICD-10-CM

## 2015-10-30 MED ORDER — HYDRALAZINE HCL 25 MG PO TABS: 75.0000 mg | ORAL_TABLET | Freq: Three times a day (TID) | ORAL | Status: DC

## 2015-10-30 NOTE — Patient Instructions (Signed)
Medication Instructions:  Your physician has recommended you make the following change in your medication:  1. Increase hydralazine ( 75 mg ) three times a day    Labwork: -None  Testing/Procedures: -None  Follow-Up: Your physician recommends that you keep your scheduled afollow-up appointment with our pharmicist, Elberta Leatherwood- PharmD, on April 28.   Any Other Special Instructions Will Be Listed Below (If Applicable).     If you need a refill on your cardiac medications before your next appointment, please call your pharmacy.

## 2015-11-02 ENCOUNTER — Encounter: Payer: Self-pay | Admitting: Gastroenterology

## 2015-11-02 ENCOUNTER — Telehealth: Payer: Self-pay | Admitting: Cardiovascular Disease

## 2015-11-02 NOTE — Telephone Encounter (Signed)
New message   [name] daughter calling    Pt c/o BP issue: STAT if pt c/o blurred vision, one-sided weakness or slurred speech  1. What are your last 5 BP readings?  Tuesday  166 /?  Wednesday 157/ ?   No blood pressure reading today   2. Are you having any other symptoms (ex. Dizziness, headache, blurred vision, passed out)? No   3. What is your BP issue? Upcoming  surgery on  5.8.2017 how long should blood pressure be monitor to proceed with surgery.

## 2015-11-02 NOTE — Telephone Encounter (Signed)
Called [name] (DPR) back. Informed her to keep monitoring her mother's BP. [name] stated patient was worried about BP dropping too low. Informed [name] that her mother's BP has not been too low for a while now, so to just keep monitoring. Encouraged patient's daughter to monitor BPs over the weekend and see how BP is trending through the next week. [name] verbalized understanding.

## 2015-11-03 ENCOUNTER — Ambulatory Visit (INDEPENDENT_AMBULATORY_CARE_PROVIDER_SITE_OTHER): Payer: Medicare Other | Admitting: Pharmacist

## 2015-11-03 DIAGNOSIS — I10 Essential (primary) hypertension: Secondary | ICD-10-CM

## 2015-11-03 NOTE — Progress Notes (Signed)
Patient ID: [name], female   DOB: 1925/04/17, 82 y.o.   MRN: [id number]     Cardiology Office Note   Date:  11/03/2015   ID:  [name], DOB Mar 22, 1925, MRN [id number]  PCP:  Nyoka Cowden, MD  Cardiologist:   Jenkins Rouge, MD  Chief Complaint  Patient presents with  . Hypertension      History of Present Illness: [name] is a 82 y.o. female patient of Dr. Johnsie Cancel who was referred to the HTN clinic.  She has a PMH significant for stroke, carotid disease, PVD, HTN and hyperlipidemia.  She was seen by Dr. Johnsie Cancel on 10/03/15 for preop evaluation prior to carotid angiogram that was planned on 10/05/15.  Pt's  BP was elevated at that visit so Dr. Johnsie Cancel stopped her HCTZ, increased amlodipine to 10mg  daily and added labetalol 300mg  BID.  On 3/29 after her first dose of labetalol, pt had a syncopal episode and was admitted to the hospital.  She had bradycardia and so labetalol was stopped.  3/31 she called with elevated BP and hydralazine 25mg  BID was started.  On 4/1, she called again with elevated BP and suggested she take a Xanax.  On 4/3, another phone call was made and so she was instructed to increase her hydralazine to 50mg  TID on 4/5.  On 4/6, she called back with elevated BP again.  She had only had 1 dose of the hydralazine 50mg  TID.  Clonidine 0.1mg  BID was added.  She notified us on 4/11 the clonidine was making her feel fatigued and then on 4/13 she was offered a follow up appointment in the HTN clinic.  During all of these events pt had no symptoms of elevated BP including dizziness, HA, blurred vision.  She is extremely anxious about her recent carotid studies and the risk of having another stroke.  Her only complaint today is that she feels shaky and weak in the legs.  She did state that taking Xanax and drinking Propel water helps improve the shakiness.   At her last visit we placed a 24-hour ambulatory BP monitor.  The 24 hour average was 143/64, awake average  149/67, asleep average 127/57 (15% dipper).  She went back to ER on 4/17 and they stopped her clonidine.  Her BP on 4/21 with PCP was 166/70.  No changes made.  On 4/24, it was 180/78 at her visit with Bonney Leitz, PA.  Her hydralazine was increased to 75mg  TID.    Pt has started taking her BP only once per day.  She is taking 1/2 tablet of her xanax only once per day.  She states she notices she feels much calmer with the Xanax.  Her BP at home the last 4 days have been low 160s.  She still admits to having a lot of anxiety when she checks her BP because she knows the results may delay her upcoming procedure.     Past Medical History  Diagnosis Date  . [place] DISEASE, LUMBAR 12/16/2008  . DIVERTICULOSIS, COLON 09/30/2008  . DYSPNEA 07/13/2008  . HYPERLIPIDEMIA 03/06/2007  . HYPERTENSION 03/06/2007  . HYPOTHYROIDISM 10/13/2007  . OSTEOARTHRITIS 03/06/2007  . Personal history of colonic polyps 09/30/2008  . TOBACCO USE, QUIT 04/12/2009  . WEAKNESS 11/09/2007  . Graves disease   . Aortic arch atherosclerosis ([place]) 06/24/2014  . Atherosclerotic ulcer of aorta ([place]) 06/24/2014  . History of embolic stroke [id number]    Left brain    Past Surgical History  Procedure Laterality Date  . Cataract extraction    . Knee surgery    . Cholecystectomy    . Cardiac catheterization    . Tee without cardioversion N/A 06/24/2014    Procedure: TRANSESOPHAGEAL ECHOCARDIOGRAM (TEE);  Surgeon: Sanda Klein, MD;  Location: [facility] ENDOSCOPY;  Service: Cardiovascular;  Laterality: N/A;     Current Outpatient Prescriptions  Medication Sig Dispense Refill  . ALPRAZolam (XANAX) 0.5 MG tablet Take 1 tablet (0.5 mg total) by mouth daily as needed for anxiety. 30 tablet 1  . amLODipine (NORVASC) 10 MG tablet Take 1 tablet (10 mg total) by mouth daily at 12 noon. (Patient taking differently: Take 10 mg by mouth daily. ) 30 tablet 11  . aspirin 325 MG tablet Take 325 mg by mouth daily.    [name] Kitchen atorvastatin (LIPITOR) 80 MG  tablet TAKE 1 TABLET BY MOUTH EVERY DAY AT 6PM (Patient taking differently: TAKE 1 TABLET BY MOUTH daily) 90 tablet 1  . hydrALAZINE (APRESOLINE) 25 MG tablet Take 3 tablets (75 mg total) by mouth 3 (three) times daily. 270 tablet 3  . levothyroxine (SYNTHROID, LEVOTHROID) 75 MCG tablet TAKE 1 TABLET BY MOUTH EVERY DAY 90 tablet 1   No current facility-administered medications for this visit.    Allergies:   Catapres; Lisinopril; and Labetalol hcl    Social History:  The patient  reports that she quit smoking about 37 years ago. She has never used smokeless tobacco. She reports that she drinks about 4.2 oz of alcohol per week. She reports that she does not use illicit drugs.   Family History:  The patient's family history includes Cancer in her maternal grandmother.    ASSESSMENT AND PLAN:  1. Hypertension- Pt's BP elevated but more stable now.  Based on her ambulatory monitor, most of her BP issues are likely related to stress and anxiety surrounding upcoming procedure.  Her BP was well controlled over the 24 hour period with appropriate dipping pattern.  We discussed options to deal with this.  She will start taking her Xanax twice daily as previously suggested, at least until she gets through the procedure.  She also wants to cut down on how often she checks her BP since this seems to trigger her anxiety.  She will start checking only every other day.  If she continues to have problems after her procedure, will be glad to see her, otherwise follow up with PCP.    Cyndee [facility]  11/03/2015 9:08 AM    [facility] [place], [place], [place]  [address] Phone: [phone]; Fax: [phone]

## 2015-11-06 ENCOUNTER — Encounter (HOSPITAL_COMMUNITY): Payer: Self-pay

## 2015-11-06 ENCOUNTER — Telehealth: Payer: Self-pay | Admitting: Cardiovascular Disease

## 2015-11-06 ENCOUNTER — Encounter (HOSPITAL_COMMUNITY)
Admission: RE | Admit: 2015-11-06 | Discharge: 2015-11-06 | Disposition: A | Discharge status: Home / Self Care | Payer: Medicare Other | Source: Ambulatory Visit | Attending: Vascular Surgery | Admitting: Vascular Surgery

## 2015-11-06 DIAGNOSIS — Z01812 Encounter for preprocedural laboratory examination: Secondary | ICD-10-CM | POA: Diagnosis not present

## 2015-11-06 DIAGNOSIS — I6522 Occlusion and stenosis of left carotid artery: Secondary | ICD-10-CM | POA: Diagnosis not present

## 2015-11-06 DIAGNOSIS — Z0183 Encounter for blood typing: Secondary | ICD-10-CM | POA: Insufficient documentation

## 2015-11-06 HISTORY — DX: Cerebral infarction, unspecified: I63.9

## 2015-11-06 HISTORY — DX: Anxiety disorder, unspecified: F41.9

## 2015-11-06 LAB — URINALYSIS, ROUTINE W REFLEX MICROSCOPIC
Bilirubin Urine: NEGATIVE
GLUCOSE, UA: NEGATIVE mg/dL
HGB URINE DIPSTICK: NEGATIVE
Ketones, ur: NEGATIVE mg/dL
LEUKOCYTES UA: NEGATIVE
Nitrite: NEGATIVE
PH: 6 (ref 5.0–8.0)
PROTEIN: NEGATIVE mg/dL
SPECIFIC GRAVITY, URINE: 1.007 (ref 1.005–1.030)

## 2015-11-06 LAB — COMPREHENSIVE METABOLIC PANEL
ALBUMIN: 3.8 g/dL (ref 3.5–5.0)
ALK PHOS: 116 U/L (ref 38–126)
ALT: 21 U/L (ref 14–54)
ANION GAP: 12 (ref 5–15)
AST: 21 U/L (ref 15–41)
BILIRUBIN TOTAL: 0.8 mg/dL (ref 0.3–1.2)
BUN: 16 mg/dL (ref 6–20)
CALCIUM: 9.2 mg/dL (ref 8.9–10.3)
CO2: 24 mmol/L (ref 22–32)
Chloride: 102 mmol/L (ref 101–111)
Creatinine, Ser: 1.15 mg/dL — ABNORMAL HIGH (ref 0.44–1.00)
GFR calc non Af Amer: 41 mL/min — ABNORMAL LOW (ref >60)
GFR, EST AFRICAN AMERICAN: 48 mL/min — AB (ref >60)
Glucose, Bld: 161 mg/dL — ABNORMAL HIGH (ref 65–99)
POTASSIUM: 3.7 mmol/L (ref 3.5–5.1)
SODIUM: 138 mmol/L (ref 135–145)
TOTAL PROTEIN: 7.1 g/dL (ref 6.5–8.1)

## 2015-11-06 LAB — CBC
HCT: 37.9 % (ref 36.0–46.0)
HEMOGLOBIN: 12.4 g/dL (ref 12.0–15.0)
MCH: 29.2 pg (ref 26.0–34.0)
MCHC: 32.7 g/dL (ref 30.0–36.0)
MCV: 89.4 fL (ref 78.0–100.0)
Platelets: 263 10*3/uL (ref 150–400)
RBC: 4.24 MIL/uL (ref 3.87–5.11)
RDW: 13.8 % (ref 11.5–15.5)
WBC: 7.3 10*3/uL (ref 4.0–10.5)

## 2015-11-06 LAB — PROTIME-INR
INR: 1.11 (ref 0.00–1.49)
PROTHROMBIN TIME: 14.5 s (ref 11.6–15.2)

## 2015-11-06 LAB — ABO/RH: ABO/RH(D): A NEG

## 2015-11-06 LAB — SURGICAL PCR SCREEN
MRSA, PCR: NEGATIVE
Staphylococcus aureus: NEGATIVE

## 2015-11-06 LAB — APTT: APTT: 30 s (ref 24–37)

## 2015-11-06 NOTE — Pre-Procedure Instructions (Signed)
AME MCNULTY  11/06/2015      [id number] - [place],  - [address]. AT [address] [place] [address] Phone: [phone] Fax: [phone]    Your procedure is scheduled on  Monday  11/13/15  Report to [facility] Admitting at 530 A.M.  Call this number if you have problems the morning of surgery:  [phone]   Remember:  Do not eat food or drink liquids after midnight.  Take these medicines the morning of surgery with A SIP OF WATER   ALPRAZOLAM (XANAX), AMLODIPINE (NORVASC) ,HYDRALAZINE (APRESOLINE), LEVOTHYROXINE    Do not wear jewelry, make-up or nail polish.  Do not wear lotions, powders, or perfumes.  You may wear deodorant.  Do not shave 48 hours prior to surgery.  Men may shave face and neck.  Do not bring valuables to the hospital.  [facility] is not responsible for any belongings or valuables.  Contacts, dentures or bridgework may not be worn into surgery.  Leave your suitcase in the car.  After surgery it may be brought to your room.  For patients admitted to the hospital, discharge time will be determined by your treatment team.  Patients discharged the day of surgery will not be allowed to drive home.   Name and phone number of your driver:   Special instructions:  [place] - Preparing for Surgery  Before surgery, you can play an important role.  Because skin is not sterile, your skin needs to be as free of germs as possible.  You can reduce the number of germs on you skin by washing with CHG (chlorahexidine gluconate) soap before surgery.  CHG is an antiseptic cleaner which kills germs and bonds with the skin to continue killing germs even after washing.  Please DO NOT use if you have an allergy to CHG or antibacterial soaps.  If your skin becomes reddened/irritated stop using the CHG and inform your nurse when you arrive at Short Stay.  Do not shave (including legs and underarms)  for at least 48 hours prior to the first CHG shower.  You may shave your face.  Please follow these instructions carefully:   1.  Shower with CHG Soap the night before surgery and the                                morning of Surgery.  2.  If you choose to wash your hair, wash your hair first as usual with your       normal shampoo.  3.  After you shampoo, rinse your hair and body thoroughly to remove the                      Shampoo.  4.  Use CHG as you would any other liquid soap.  You can apply chg directly       to the skin and wash gently with scrungie or a clean washcloth.  5.  Apply the CHG Soap to your body ONLY FROM THE NECK DOWN.        Do not use on open wounds or open sores.  Avoid contact with your eyes,       ears, mouth and genitals (private parts).  Wash genitals (private parts)       with your normal soap.  6.  Wash thoroughly, paying special attention to  the area where your surgery        will be performed.  7.  Thoroughly rinse your body with warm water from the neck down.  8.  DO NOT shower/wash with your normal soap after using and rinsing off       the CHG Soap.  9.  Pat yourself dry with a clean towel.            10.  Wear clean pajamas.            11.  Place clean sheets on your bed the night of your first shower and do not        sleep with pets.  Day of Surgery  Do not apply any lotions/deoderants the morning of surgery.  Please wear clean clothes to the hospital/surgery center.    Please read over the following fact sheets that you were given. Pain Booklet, Coughing and Deep Breathing, Blood Transfusion Information, MRSA Information and Surgical Site Infection Prevention

## 2015-11-06 NOTE — Telephone Encounter (Signed)
I spoke with [name] and made her aware that the pt needs to go to her pre-op appointment today as scheduled.  I reviewed recommendations from HTN clinic on 11/03/15 with [name] and made her aware of instructions.  The pt can continue to check BP every other day and touch base with the HTN clinic at the end of the week to give readings and determine if any other adjustments need to be made prior to surgery.

## 2015-11-06 NOTE — Telephone Encounter (Signed)
[name] is calling because on yesterday her mother BP was 176/80. Supposed to have Surgery on next Week and supposed to have pre-op today. Is this considered stable or is it still to high? Please call   Thanks

## 2015-11-10 ENCOUNTER — Other Ambulatory Visit: Payer: Self-pay | Admitting: Internal Medicine

## 2015-11-12 NOTE — Anesthesia Preprocedure Evaluation (Addendum)
Anesthesia Evaluation  Patient identified by MRN, date of birth, ID band Patient awake    Reviewed: Allergy & Precautions, H&P , NPO status , Patient's Chart, lab work & pertinent test results  Airway Mallampati: II  TM Distance: >3 FB Neck ROM: Full    Dental no notable dental hx. (+) Edentulous Upper, Partial Lower, Dental Advisory Given   Pulmonary neg pulmonary ROS, former smoker,    Pulmonary exam normal breath sounds clear to auscultation       Cardiovascular hypertension, Pt. on medications + Peripheral Vascular Disease   Rhythm:Regular Rate:Normal     Neuro/Psych Anxiety CVA, No Residual Symptoms negative psych ROS   GI/Hepatic negative GI ROS, Neg liver ROS,   Endo/Other  Hypothyroidism   Renal/GU negative Renal ROS  negative genitourinary   Musculoskeletal  (+) Arthritis ,   Abdominal   Peds  Hematology negative hematology ROS (+)   Anesthesia Other Findings   Reproductive/Obstetrics negative OB ROS                            Anesthesia Physical Anesthesia Plan  ASA: III  Anesthesia Plan: General   Post-op Pain Management:    Induction: Intravenous  Airway Management Planned: Oral ETT  Additional Equipment: Arterial line  Intra-op Plan:   Post-operative Plan: Extubation in OR and Possible Post-op intubation/ventilation  Informed Consent: I have reviewed the patients History and Physical, chart, labs and discussed the procedure including the risks, benefits and alternatives for the proposed anesthesia with the patient or authorized representative who has indicated his/her understanding and acceptance.   Dental advisory given  Plan Discussed with: CRNA  Anesthesia Plan Comments:         Anesthesia Quick Evaluation

## 2015-11-13 ENCOUNTER — Inpatient Hospital Stay (HOSPITAL_COMMUNITY): Payer: Medicare Other | Admitting: Anesthesiology

## 2015-11-13 ENCOUNTER — Inpatient Hospital Stay (HOSPITAL_COMMUNITY)
Admission: RE | Admit: 2015-11-13 | Discharge: 2015-11-17 | DRG: 038 | Disposition: A | Discharge status: Home Health | Payer: Medicare Other | Source: Ambulatory Visit | Attending: Vascular Surgery | Admitting: Vascular Surgery

## 2015-11-13 ENCOUNTER — Encounter (HOSPITAL_COMMUNITY)
Admission: RE | Disposition: A | Discharge status: Home Health | Payer: Self-pay | Source: Ambulatory Visit | Attending: Vascular Surgery

## 2015-11-13 ENCOUNTER — Encounter (HOSPITAL_COMMUNITY): Payer: Self-pay | Admitting: Certified Registered"

## 2015-11-13 DIAGNOSIS — F419 Anxiety disorder, unspecified: Secondary | ICD-10-CM | POA: Diagnosis present

## 2015-11-13 DIAGNOSIS — E039 Hypothyroidism, unspecified: Secondary | ICD-10-CM | POA: Diagnosis present

## 2015-11-13 DIAGNOSIS — I700 Atherosclerosis of aorta: Secondary | ICD-10-CM | POA: Diagnosis present

## 2015-11-13 DIAGNOSIS — Z8673 Personal history of transient ischemic attack (TIA), and cerebral infarction without residual deficits: Secondary | ICD-10-CM | POA: Diagnosis not present

## 2015-11-13 DIAGNOSIS — Z888 Allergy status to other drugs, medicaments and biological substances status: Secondary | ICD-10-CM

## 2015-11-13 DIAGNOSIS — R51 Headache: Secondary | ICD-10-CM | POA: Diagnosis not present

## 2015-11-13 DIAGNOSIS — J90 Pleural effusion, not elsewhere classified: Secondary | ICD-10-CM | POA: Diagnosis not present

## 2015-11-13 DIAGNOSIS — K5900 Constipation, unspecified: Secondary | ICD-10-CM | POA: Diagnosis not present

## 2015-11-13 DIAGNOSIS — I6529 Occlusion and stenosis of unspecified carotid artery: Secondary | ICD-10-CM | POA: Diagnosis present

## 2015-11-13 DIAGNOSIS — D62 Acute posthemorrhagic anemia: Secondary | ICD-10-CM | POA: Diagnosis not present

## 2015-11-13 DIAGNOSIS — I6523 Occlusion and stenosis of bilateral carotid arteries: Principal | ICD-10-CM | POA: Diagnosis present

## 2015-11-13 DIAGNOSIS — I10 Essential (primary) hypertension: Secondary | ICD-10-CM | POA: Diagnosis present

## 2015-11-13 DIAGNOSIS — J9811 Atelectasis: Secondary | ICD-10-CM | POA: Diagnosis not present

## 2015-11-13 DIAGNOSIS — I739 Peripheral vascular disease, unspecified: Secondary | ICD-10-CM | POA: Diagnosis present

## 2015-11-13 DIAGNOSIS — R0602 Shortness of breath: Secondary | ICD-10-CM

## 2015-11-13 DIAGNOSIS — E785 Hyperlipidemia, unspecified: Secondary | ICD-10-CM | POA: Diagnosis present

## 2015-11-13 DIAGNOSIS — Z7982 Long term (current) use of aspirin: Secondary | ICD-10-CM | POA: Diagnosis not present

## 2015-11-13 DIAGNOSIS — I6522 Occlusion and stenosis of left carotid artery: Secondary | ICD-10-CM

## 2015-11-13 DIAGNOSIS — Z87891 Personal history of nicotine dependence: Secondary | ICD-10-CM

## 2015-11-13 HISTORY — PX: ENDARTERECTOMY: SHX5162

## 2015-11-13 HISTORY — PX: PATCH ANGIOPLASTY: SHX6230

## 2015-11-13 SURGERY — ENDARTERECTOMY, CAROTID
Anesthesia: General | Site: Neck | Laterality: Left

## 2015-11-13 MED ORDER — DEXTRAN 40 IN SALINE 10-0.9 % IV SOLN
INTRAVENOUS | Status: DC | PRN
  Administered 2015-11-13: 500 mL

## 2015-11-13 MED ORDER — GUAIFENESIN-DM 100-10 MG/5ML PO SYRP
15.0000 mL | ORAL_SOLUTION | ORAL | Status: DC | PRN
  Administered 2015-11-15: 15 mL via ORAL
  Filled 2015-11-13: qty 15

## 2015-11-13 MED ORDER — ATORVASTATIN CALCIUM 80 MG PO TABS
80.0000 mg | ORAL_TABLET | Freq: Every day | ORAL | Status: DC
  Administered 2015-11-13 – 2015-11-16 (×4): 80 mg via ORAL
  Filled 2015-11-13 (×4): qty 1

## 2015-11-13 MED ORDER — PHENYLEPHRINE HCL 10 MG/ML IJ SOLN
10.0000 mg | INTRAVENOUS | Status: DC | PRN
  Administered 2015-11-13 (×2): via INTRAVENOUS
  Administered 2015-11-13: 20 ug/min via INTRAVENOUS

## 2015-11-13 MED ORDER — 0.9 % SODIUM CHLORIDE (POUR BTL) OPTIME
TOPICAL | Status: DC | PRN
  Administered 2015-11-13: 3000 mL

## 2015-11-13 MED ORDER — PANTOPRAZOLE SODIUM 40 MG PO TBEC
40.0000 mg | DELAYED_RELEASE_TABLET | Freq: Every day | ORAL | Status: DC
  Administered 2015-11-13 – 2015-11-17 (×5): 40 mg via ORAL
  Filled 2015-11-13 (×5): qty 1

## 2015-11-13 MED ORDER — ALUM & MAG HYDROXIDE-SIMETH 200-200-20 MG/5ML PO SUSP
15.0000 mL | ORAL | Status: DC | PRN
  Administered 2015-11-15: 30 mL via ORAL
  Filled 2015-11-13: qty 30

## 2015-11-13 MED ORDER — LIDOCAINE HCL (CARDIAC) 20 MG/ML IV SOLN
INTRAVENOUS | Status: DC | PRN
  Administered 2015-11-13: 30 mg via INTRAVENOUS

## 2015-11-13 MED ORDER — ONDANSETRON HCL 4 MG/2ML IJ SOLN
INTRAMUSCULAR | Status: AC
  Filled 2015-11-13: qty 2

## 2015-11-13 MED ORDER — SODIUM CHLORIDE 0.9 % IV SOLN
INTRAVENOUS | Status: DC
  Administered 2015-11-13 – 2015-11-15 (×3): via INTRAVENOUS

## 2015-11-13 MED ORDER — PHENYLEPHRINE 40 MCG/ML (10ML) SYRINGE FOR IV PUSH (FOR BLOOD PRESSURE SUPPORT)
PREFILLED_SYRINGE | INTRAVENOUS | Status: AC
  Filled 2015-11-13: qty 10

## 2015-11-13 MED ORDER — SODIUM CHLORIDE 0.9 % IV SOLN
0.0125 ug/kg/min | INTRAVENOUS | Status: AC
  Administered 2015-11-13: .2 ug/kg/min via INTRAVENOUS
  Filled 2015-11-13: qty 2000

## 2015-11-13 MED ORDER — ONDANSETRON HCL 4 MG/2ML IJ SOLN
INTRAMUSCULAR | Status: DC | PRN
  Administered 2015-11-13: 4 mg via INTRAVENOUS

## 2015-11-13 MED ORDER — HEPARIN SODIUM (PORCINE) 1000 UNIT/ML IJ SOLN
INTRAMUSCULAR | Status: AC
  Filled 2015-11-13: qty 1

## 2015-11-13 MED ORDER — ACETAMINOPHEN 325 MG PO TABS
325.0000 mg | ORAL_TABLET | ORAL | Status: DC | PRN
  Administered 2015-11-14 – 2015-11-17 (×4): 650 mg via ORAL
  Filled 2015-11-13 (×4): qty 2

## 2015-11-13 MED ORDER — HYDRALAZINE HCL 20 MG/ML IJ SOLN
5.0000 mg | INTRAMUSCULAR | Status: DC | PRN
  Filled 2015-11-13: qty 1

## 2015-11-13 MED ORDER — HEPARIN (PORCINE) IN NACL 100-0.45 UNIT/ML-% IJ SOLN
500.0000 [IU]/h | INTRAMUSCULAR | Status: DC
  Filled 2015-11-13: qty 250

## 2015-11-13 MED ORDER — AMLODIPINE BESYLATE 10 MG PO TABS
10.0000 mg | ORAL_TABLET | Freq: Every day | ORAL | Status: DC
  Administered 2015-11-13 – 2015-11-17 (×5): 10 mg via ORAL
  Filled 2015-11-13 (×5): qty 1

## 2015-11-13 MED ORDER — LABETALOL HCL 5 MG/ML IV SOLN: 10.0000 mg | INTRAVENOUS | Status: DC | PRN

## 2015-11-13 MED ORDER — ROCURONIUM BROMIDE 50 MG/5ML IV SOLN
INTRAVENOUS | Status: AC
  Filled 2015-11-13: qty 1

## 2015-11-13 MED ORDER — PHENYLEPHRINE HCL 10 MG/ML IJ SOLN
INTRAMUSCULAR | Status: AC
  Filled 2015-11-13: qty 1

## 2015-11-13 MED ORDER — ACETAMINOPHEN 650 MG RE SUPP: 325.0000 mg | RECTAL | Status: DC | PRN

## 2015-11-13 MED ORDER — MORPHINE SULFATE (PF) 2 MG/ML IV SOLN: 2.0000 mg | INTRAVENOUS | Status: DC | PRN

## 2015-11-13 MED ORDER — LACTATED RINGERS IV SOLN
INTRAVENOUS | Status: DC | PRN
  Administered 2015-11-13 (×2): via INTRAVENOUS

## 2015-11-13 MED ORDER — SODIUM CHLORIDE 0.9 % IV SOLN: INTRAVENOUS | Status: DC

## 2015-11-13 MED ORDER — MAGNESIUM SULFATE 2 GM/50ML IV SOLN
2.0000 g | Freq: Every day | INTRAVENOUS | Status: DC | PRN
  Filled 2015-11-13: qty 50

## 2015-11-13 MED ORDER — EPHEDRINE 5 MG/ML INJ
INTRAVENOUS | Status: AC
  Filled 2015-11-13: qty 10

## 2015-11-13 MED ORDER — ALPRAZOLAM 0.5 MG PO TABS
0.5000 mg | ORAL_TABLET | Freq: Every day | ORAL | Status: DC | PRN
  Administered 2015-11-15: 0.5 mg via ORAL
  Filled 2015-11-13: qty 1

## 2015-11-13 MED ORDER — DEXTROSE 5 % IV SOLN
1.5000 g | Freq: Two times a day (BID) | INTRAVENOUS | Status: AC
  Administered 2015-11-13 – 2015-11-14 (×2): 1.5 g via INTRAVENOUS
  Filled 2015-11-13 (×2): qty 1.5

## 2015-11-13 MED ORDER — LACTATED RINGERS IV SOLN: INTRAVENOUS | Status: DC

## 2015-11-13 MED ORDER — CLOPIDOGREL BISULFATE 300 MG PO TABS: 300.0000 mg | ORAL_TABLET | Freq: Every day | ORAL | Status: DC

## 2015-11-13 MED ORDER — SODIUM CHLORIDE 0.9 % IV SOLN: 500.0000 mL | Freq: Once | INTRAVENOUS | Status: DC | PRN

## 2015-11-13 MED ORDER — LEVOTHYROXINE SODIUM 75 MCG PO TABS
75.0000 ug | ORAL_TABLET | Freq: Every day | ORAL | Status: DC
  Administered 2015-11-14 – 2015-11-17 (×4): 75 ug via ORAL
  Filled 2015-11-13 (×4): qty 1

## 2015-11-13 MED ORDER — FENTANYL CITRATE (PF) 250 MCG/5ML IJ SOLN
INTRAMUSCULAR | Status: AC
  Filled 2015-11-13: qty 5

## 2015-11-13 MED ORDER — PHENOL 1.4 % MT LIQD
1.0000 | OROMUCOSAL | Status: DC | PRN
  Filled 2015-11-13: qty 177

## 2015-11-13 MED ORDER — HEPARIN SODIUM (PORCINE) 1000 UNIT/ML IJ SOLN
INTRAMUSCULAR | Status: DC | PRN
  Administered 2015-11-13: 2000 [IU] via INTRAVENOUS
  Administered 2015-11-13: 7000 [IU] via INTRAVENOUS

## 2015-11-13 MED ORDER — SODIUM CHLORIDE 0.9 % IV SOLN
INTRAVENOUS | Status: DC | PRN
  Administered 2015-11-13: 500 mL

## 2015-11-13 MED ORDER — CHLORHEXIDINE GLUCONATE CLOTH 2 % EX PADS: 6.0000 | MEDICATED_PAD | Freq: Once | CUTANEOUS | Status: DC

## 2015-11-13 MED ORDER — PROPOFOL 10 MG/ML IV BOLUS
INTRAVENOUS | Status: AC
  Filled 2015-11-13: qty 20

## 2015-11-13 MED ORDER — DOCUSATE SODIUM 100 MG PO CAPS
100.0000 mg | ORAL_CAPSULE | Freq: Every day | ORAL | Status: DC
  Administered 2015-11-14 – 2015-11-17 (×4): 100 mg via ORAL
  Filled 2015-11-13 (×4): qty 1

## 2015-11-13 MED ORDER — HYDRALAZINE HCL 50 MG PO TABS
75.0000 mg | ORAL_TABLET | Freq: Three times a day (TID) | ORAL | Status: DC
  Administered 2015-11-13 – 2015-11-17 (×11): 75 mg via ORAL
  Filled 2015-11-13 (×12): qty 1

## 2015-11-13 MED ORDER — HEMOSTATIC AGENTS (NO CHARGE) OPTIME
TOPICAL | Status: DC | PRN
  Administered 2015-11-13: 2 via TOPICAL

## 2015-11-13 MED ORDER — FENTANYL CITRATE (PF) 100 MCG/2ML IJ SOLN: 25.0000 ug | INTRAMUSCULAR | Status: DC | PRN

## 2015-11-13 MED ORDER — LIDOCAINE 2% (20 MG/ML) 5 ML SYRINGE
INTRAMUSCULAR | Status: AC
  Filled 2015-11-13: qty 5

## 2015-11-13 MED ORDER — PROTAMINE SULFATE 10 MG/ML IV SOLN
INTRAVENOUS | Status: DC | PRN
  Administered 2015-11-13 (×4): 10 mg via INTRAVENOUS

## 2015-11-13 MED ORDER — EPHEDRINE SULFATE 50 MG/ML IJ SOLN
INTRAMUSCULAR | Status: DC | PRN
  Administered 2015-11-13 (×3): 10 mg via INTRAVENOUS

## 2015-11-13 MED ORDER — CLOPIDOGREL BISULFATE 300 MG PO TABS
300.0000 mg | ORAL_TABLET | Freq: Once | ORAL | Status: AC
  Administered 2015-11-13: 300 mg via ORAL
  Filled 2015-11-13: qty 1

## 2015-11-13 MED ORDER — LIDOCAINE HCL (PF) 1 % IJ SOLN
INTRAMUSCULAR | Status: AC
  Filled 2015-11-13: qty 30

## 2015-11-13 MED ORDER — DEXTRAN 40 IN SALINE 10-0.9 % IV SOLN
INTRAVENOUS | Status: AC
  Filled 2015-11-13: qty 500

## 2015-11-13 MED ORDER — PROPOFOL 10 MG/ML IV BOLUS
INTRAVENOUS | Status: DC | PRN
  Administered 2015-11-13 (×2): 50 mg via INTRAVENOUS

## 2015-11-13 MED ORDER — OXYCODONE-ACETAMINOPHEN 5-325 MG PO TABS
1.0000 | ORAL_TABLET | ORAL | Status: DC | PRN
  Administered 2015-11-13 – 2015-11-14 (×3): 1 via ORAL
  Filled 2015-11-13 (×3): qty 1

## 2015-11-13 MED ORDER — CLOPIDOGREL BISULFATE 75 MG PO TABS
75.0000 mg | ORAL_TABLET | Freq: Every day | ORAL | Status: DC
  Administered 2015-11-14 – 2015-11-17 (×4): 75 mg via ORAL
  Filled 2015-11-13 (×4): qty 1

## 2015-11-13 MED ORDER — ROCURONIUM BROMIDE 100 MG/10ML IV SOLN
INTRAVENOUS | Status: DC | PRN
  Administered 2015-11-13: 50 mg via INTRAVENOUS

## 2015-11-13 MED ORDER — CEFUROXIME SODIUM 1.5 G IJ SOLR
1.5000 g | INTRAMUSCULAR | Status: AC
  Administered 2015-11-13: 1.5 g via INTRAVENOUS
  Filled 2015-11-13: qty 1.5

## 2015-11-13 MED ORDER — POTASSIUM CHLORIDE CRYS ER 20 MEQ PO TBCR
20.0000 meq | EXTENDED_RELEASE_TABLET | Freq: Every day | ORAL | Status: DC | PRN
Start: 2015-11-13 — End: 2015-11-15

## 2015-11-13 MED ORDER — PROTAMINE SULFATE 10 MG/ML IV SOLN
INTRAVENOUS | Status: AC
  Filled 2015-11-13: qty 5

## 2015-11-13 MED ORDER — METOPROLOL TARTRATE 5 MG/5ML IV SOLN: 2.0000 mg | INTRAVENOUS | Status: DC | PRN

## 2015-11-13 MED ORDER — ONDANSETRON HCL 4 MG/2ML IJ SOLN
4.0000 mg | Freq: Four times a day (QID) | INTRAMUSCULAR | Status: DC | PRN
  Administered 2015-11-14 – 2015-11-16 (×4): 4 mg via INTRAVENOUS
  Filled 2015-11-13 (×4): qty 2

## 2015-11-13 SURGICAL SUPPLY — 56 items
ADPR TBG 2 MALE LL ART (MISCELLANEOUS) ×1
AGENT HMST SPONGE THK3/8 (HEMOSTASIS) ×2
BAG DECANTER FOR FLEXI CONT (MISCELLANEOUS) ×3 IMPLANT
CANISTER SUCTION 2500CC (MISCELLANEOUS) ×3 IMPLANT
CATH ROBINSON RED A/P 18FR (CATHETERS) ×3 IMPLANT
CATH SUCT 10FR WHISTLE TIP (CATHETERS) IMPLANT
CLIP TI MEDIUM 6 (CLIP) ×3 IMPLANT
CLIP TI WIDE RED SMALL 6 (CLIP) ×3 IMPLANT
COVER PROBE W GEL 5X96 (DRAPES) ×2 IMPLANT
CRADLE DONUT ADULT HEAD (MISCELLANEOUS) ×3 IMPLANT
ELECT REM PT RETURN 9FT ADLT (ELECTROSURGICAL) ×3
ELECTRODE REM PT RTRN 9FT ADLT (ELECTROSURGICAL) ×1 IMPLANT
GAUZE SPONGE 4X4 12PLY STRL (GAUZE/BANDAGES/DRESSINGS) ×6 IMPLANT
GLOVE BIO SURGEON STRL SZ 6.5 (GLOVE) ×4 IMPLANT
GLOVE BIO SURGEON STRL SZ7 (GLOVE) ×5 IMPLANT
GLOVE BIO SURGEONS STRL SZ 6.5 (GLOVE) ×4
GLOVE BIOGEL PI IND STRL 6.5 (GLOVE) IMPLANT
GLOVE BIOGEL PI IND STRL 7.5 (GLOVE) ×1 IMPLANT
GLOVE BIOGEL PI INDICATOR 6.5 (GLOVE) ×10
GLOVE BIOGEL PI INDICATOR 7.5 (GLOVE) ×4
GLOVE SURG SS PI 7.0 STRL IVOR (GLOVE) ×2 IMPLANT
GOWN STRL REUS W/ TWL LRG LVL3 (GOWN DISPOSABLE) ×3 IMPLANT
GOWN STRL REUS W/TWL LRG LVL3 (GOWN DISPOSABLE) ×15
HEMOSTAT SPONGE AVITENE ULTRA (HEMOSTASIS) ×4 IMPLANT
IV ADAPTER SYR DOUBLE MALE LL (MISCELLANEOUS) ×2 IMPLANT
KIT BASIN OR (CUSTOM PROCEDURE TRAY) ×3 IMPLANT
KIT ROOM TURNOVER OR (KITS) ×3 IMPLANT
KIT SHUNT ARGYLE CAROTID ART 6 (VASCULAR PRODUCTS) ×2 IMPLANT
LIQUID BAND (GAUZE/BANDAGES/DRESSINGS) ×3 IMPLANT
NS IRRIG 1000ML POUR BTL (IV SOLUTION) ×9 IMPLANT
PACK CAROTID (CUSTOM PROCEDURE TRAY) ×3 IMPLANT
PAD ARMBOARD 7.5X6 YLW CONV (MISCELLANEOUS) ×6 IMPLANT
PATCH VASC XENOSURE 1CMX6CM (Vascular Products) ×3 IMPLANT
PATCH VASC XENOSURE 1X6 (Vascular Products) IMPLANT
SET COLLECT BLD 21X3/4 12 PB (MISCELLANEOUS) IMPLANT
SHUNT CAROTID BYPASS 10 (VASCULAR PRODUCTS) IMPLANT
SHUNT CAROTID BYPASS 12FRX15.5 (VASCULAR PRODUCTS) IMPLANT
SPONGE GAUZE 4X4 12PLY STER LF (GAUZE/BANDAGES/DRESSINGS) ×2 IMPLANT
SPONGE INTESTINAL PEANUT (DISPOSABLE) ×3 IMPLANT
STOPCOCK 4 WAY LG BORE MALE ST (IV SETS) ×2 IMPLANT
SUT ETHILON 3 0 PS 1 (SUTURE) ×2 IMPLANT
SUT MNCRL AB 4-0 PS2 18 (SUTURE) ×3 IMPLANT
SUT PROLENE 6 0 BV (SUTURE) ×27 IMPLANT
SUT PROLENE 7 0 BV 1 (SUTURE) IMPLANT
SUT SILK 3 0 TIES 17X18 (SUTURE)
SUT SILK 3-0 18XBRD TIE BLK (SUTURE) IMPLANT
SUT VIC AB 3-0 SH 27 (SUTURE) ×3
SUT VIC AB 3-0 SH 27X BRD (SUTURE) ×1 IMPLANT
SYR TB 1ML LUER SLIP (SYRINGE) IMPLANT
SYSTEM CHEST DRAIN TLS 7FR (DRAIN) ×2 IMPLANT
TUBE CONNECTING 20'X1/4 (TUBING) ×1
TUBE CONNECTING 20X1/4 (TUBING) ×1 IMPLANT
TUBING ART PRESS 48 MALE/FEM (TUBING) ×2 IMPLANT
TUBING EXTENTION W/L.L. (IV SETS) ×2 IMPLANT
WATER STERILE IRR 1000ML POUR (IV SOLUTION) ×3 IMPLANT
YANKAUER SUCT BULB TIP NO VENT (SUCTIONS) ×2 IMPLANT

## 2015-11-13 NOTE — Transfer of Care (Signed)
Immediate Anesthesia Transfer of Care Note  Patient: [name]  Procedure(s) Performed: Procedure(s): LEFT CAROTID ARTERY ENDARTERECTOMY (Left) WITH 1CM X 6CM  XENOSURE BIOLOGIC PATCH ANGIOPLASTY (Left)  Patient Location: PACU  Anesthesia Type:General  Level of Consciousness: awake, alert  and oriented  Airway & Oxygen Therapy: Patient Spontanous Breathing and Patient connected to nasal cannula oxygen  Post-op Assessment: Report given to RN, Patient moving all extremities X 4 and Patient able to stick tongue midline  Post vital signs: Reviewed and stable  Last Vitals:  Filed Vitals:   11/13/15 0603  BP: 169/52  Pulse: 86  Temp: 36.6 C  Resp: 20    Last Pain:  Filed Vitals:   11/13/15 0606  PainSc: 0-No pain      Patients Stated Pain Goal: 3 ([id number])  Complications: No apparent anesthesia complications

## 2015-11-13 NOTE — Anesthesia Procedure Notes (Signed)
Procedure Name: Intubation Date/Time: 11/13/2015 8:46 AM Performed by: Barrington Ellison Pre-anesthesia Checklist: Patient identified, Emergency Drugs available, Suction available, Patient being monitored and Timeout performed Patient Re-evaluated:Patient Re-evaluated prior to inductionOxygen Delivery Method: Circle system utilized Preoxygenation: Pre-oxygenation with 100% oxygen Intubation Type: IV induction Ventilation: Mask ventilation without difficulty Laryngoscope Size: Mac and 3 Grade View: Grade I Tube type: Oral Tube size: 7.0 mm Number of attempts: 1 Airway Equipment and Method: Stylet Placement Confirmation: ETT inserted through vocal cords under direct vision,  positive ETCO2 and breath sounds checked- equal and bilateral Secured at: 20 cm Tube secured with: Tape Dental Injury: Teeth and Oropharynx as per pre-operative assessment

## 2015-11-13 NOTE — Op Note (Signed)
OPERATIVE NOTE  PROCEDURE:   1.  Left carotid endarterectomy with bovine patch angioplasty 2.  Left intraoperative carotid ultrasound  PRE-OPERATIVE DIAGNOSIS: left symptomatic carotid stenosis >80%  POST-OPERATIVE DIAGNOSIS: same as above   SURGEON: Adele Barthel, MD  ASSISTANT(S): Silva Bandy, PAC   ANESTHESIA: general  ESTIMATED BLOOD LOSS: 150 cc  FINDING(S): 1.  Continuous Doppler audible flow signatures are appropriate for each carotid artery. 2.  No evidence of intimal flap visualized on transverse or longitudinal ultrasonography. 3.  Near occluded internal carotid artery at distal end of plaque 4.  Soft plaque throughout internal carotid artery and common carotid artery with some evidence of healed ulcerated areas 5.  Residual common carotid artery plaque occupying <30% of lumen  SPECIMEN(S):  Carotid plaque (sent to Pathology)  INDICATIONS:   [name] is a 82 y.o. female who presents with left symptomatic carotid stenosis >80%.  I discussed with the patient the risks, benefits, and alternatives to carotid endarterectomy.  The patient is NOT a candidate for carotid artery stenting. I discussed the procedural details of carotid endarterectomy with the patient.  The patient is aware that the risks of carotid endarterectomy include but are not limited to: bleeding, infection, stroke, myocardial infarction, death, cranial nerve injuries both temporary and permanent, neck hematoma, possible airway compromise, labile blood pressure post-operatively, cerebral hyperperfusion syndrome, and possible need for additional interventions in the future. The patient is aware of the risks and agrees to proceed forward with the procedure.  DESCRIPTION: After full informed written consent was obtained from the patient, the patient was brought back to the operating room and placed supine upon the operating table.  Prior to induction, the patient received IV antibiotics.  After obtaining  adequate anesthesia, the patient was placed into semi-Fowler position with a shoulder roll in place and the patient's neck slightly hyperextended and rotated away from the surgical site.  The patient was prepped in the standard fashion for a left carotid endarterectomy.  I made an incision anterior to the sternocleidomastoid muscle and dissected down through the subcutaneous tissue.  The platysmas was opened with electrocautery.  Then I dissected down to the internal jugular vein.  This was dissected posteriorly until I obtained visualization of the common carotid artery.  This was dissected out and then an umbilical tape was placed around the common carotid artery and I loosely applied a Rumel tourniquet.  I then dissected in a periadventitial fashion along the common carotid artery up to the bifurcation.  I then identified the external carotid artery and the superior thyroid artery.  A 2-0 silk tie was looped around the superior thyroid artery, and I also dissected out the external carotid artery and placed a vessel loop around it.  In continuing the dissection to the internal carotid artery, I identified the facial vein.  This was ligated and then transected, giving me improved exposure of the internal carotid artery.  In the process of this dissection, I tried to find the hypoglossal nerve, but I never encountered a structure that appeared to be this nerve.  I then dissected out the internal carotid artery until I identified an area of soft tissue in the internal carotid artery.  I dissected slightly distal to this area, and placed an umbilical tape around the artery and loosely applied a Rumel tourniquet.  At this point, we gave the patient a therapeutic bolus of Heparin intravenously of 7000 units of Heparin.  In total, I gave 9000 units of Heparin,  giving another 2000 units in one hour.  Due to the length of arteriotomy anticipated, I want to try to complete this endarterectomy without a shunt.  I obtained  the tubing for a back pressure.  A butterfly needle was connected and I passed one end of the tubing up to anesthesia was connection to another arterial line setup.    After waiting 3 minutes, then I clamped the external carotid artery and then the common carotid artery.  I cannulated the common carotid artery proximal to the bifurcation.  There was some backbleeding into the tubing.  I only got a back pressure of 5-10 mm Hg, so I decided a shunt was necessary.  I then made an arteriotomy in the common carotid artery with a 11 blade, and extended the arteriotomy with a Potts scissor down into the common carotid artery, then I carried the arteriotomy through the bifurcation into the internal carotid artery until I reached an area that was not diseased.  At this point, I took the 10 shunt that previously been prepared and I inserted it into the internal carotid artery.  The Rumel tourniquet was then applied to this end of the shunt.  I unclamped the shunt to verify retrograde blood flow in the internal carotid artery.  I then placed the other end of the shunt into the common carotid artery after unclamping the artery.  The Rumel was tightened down around the shunt.  At this point, I verified blood flow in the shunt with a continuous doppler.  At this point, I started the endarterectomy in the common carotid artery with a Technical brewer and carried this dissection down into the common carotid artery circumferentially.  Then I transected the plaque at a segment where it was adherent.  I then carried this dissection up into the external carotid artery.  The plaque was extracted by unclamping the external carotid artery and everting the artery.  The dissection was then carried into the internal carotid artery, extracting the remaining portion of the carotid plaque.  I passed the plaque off the field as a specimen.  I felt the residual plaque in the common carotid artery was ulcerated and unsafe to incorporate into  the suture line, so I dissected the common carotid artery more proximally for ~3 cm.  I placed a new Rumel tourniquet and reapplied pressure.  I removed the old Rumel tourniquet and then extended the arteriotomy proximally until I found non-ulcerated plaque that was adherent to the wall.  I transected the plaque circumferentially at this point and complete this extension of the endarterectomy.    I then spent the next 30 minutes removing intimal flaps and loose debris.  Eventually I reached the point where the residual plaque was densely adherent and any further dissection would compromise the integrity of the wall.  After verifying that there was no more loose intimal flaps or debris, I re-interrogated the entirety of this carotid artery.  At this point, I was satisfied that the minimal remaining disease was densely adherent to the wall and wall integrity was intact.  At this point, I then fashioned a bovine pericardial patch for the geometry of this artery and sewed it in place with two running stitch of 6-0 Prolene, one from each end.  It was evident during this process that the anterior arterial wall was somewhat redundant after the endarterectomy.  I anticipated some bleeding due to expected pleats in the suture line.    Prior to completing this patch angioplasty,  I removed the shunt first from the internal carotid artery, from which there was excellent backbleeding, and clamped it.  Then I removed the shunt from the common carotid artery, from which there was excellent antegrade bleeding, and then clamped it.  At this point, I allowed the external carotid artery to backbleed, which was excellent.  Then I instilled heparinized saline in this patched artery and then completed the patch angioplasty in the usual fashion.  First, I released the clamp on the external carotid artery.  As expected there was multiple bleeding points from pleats that required repaired with interrupted 6-0 sutures.  I then released  the clamp on the common carotid artery, while compressing the internal carotid artery.  After waiting a few seconds, I then released it on the internal carotid artery.  I then interrogated this patient's arteries with the continuous Doppler.  The audible waveforms in each artery were consistent with the expected characteristics for each artery.  The Sonosite probe was then sterilely draped and used to interrogate the carotid artery in both longitudinal and transverse views.  No technical issues were visualized.  The carotid artery appeared to be widely patent.  At this point, I washed out the wound, and placed Avitene throughout.  Initially, I had planned on running this patient on low dose Heparin drip post-operatively due to the thrombogenic appearance of the arterial wall, but there was incomplete control of bleeding with sutures and Avitene.  I gave the patient 40 mg of protamine to reverse her anticoagulation.     I reapplied the Avitene and then waited a few minutes.  I removed the Avitene and washed out the wound.  There was no more active bleeding in the surgical site but there was ooze from the posterior wall of the surgical site, behind the artery, adjacent to the vagus nerve.  I did not think electrocautery could be safely used, so I placed a TLS drain in the usual fashion, routing it inferiorly.  I tied the TLS in place with a 3-0 Nylon tied to the drain.  The drain was shorted to appropriate dimension and placed adjacent to the artery.  The external end of the TLS drain was docked onto its test tube receptacle.  I then reapproximated the platysma muscle with a running stitch of 3-0 Vicryl.  The skin was then reapproximated with a running subcuticular 4-0 Monocryl stitch.  The skin was then cleaned, dried and Dermabond was used to reinforce the skin closure.  A test tube was attached to the TLS drain to charge the drain.  The patient woke without any problems, neurologically intact.       COMPLICATIONS: none  CONDITION: stable  Adele Barthel, MD [facility] Office: [phone] Pager: [phone]  11/13/2015, 12:14 PM

## 2015-11-13 NOTE — H&P (View-Only) (Signed)
Established Carotid Patient  History of Present Illness  [name] is a 82 y.o. ([birth year]) female who presents with chief complaint: follow up on CTA. Pt has been cleared by Cardiology in the interval time period.  This patient previously had a L parietal stroke in Dec 2015. It was felt based on her work-up that she had embolic phenomena from her aortic arch. Reported outside carotid studies demonstrated a left high grade stenosis >80%. The patient was not referred to a vascular surgeon until recently.  Patient has known history of TIA: right hand weakness and paraesthesia. The patient has never had amaurosis fugax or monocular blindness. The patient has never had facial drooping or hemiplegia. The patient has never had receptive or expressive aphasia. he patient's previous neurologic deficits have resolved. The patient's risks factors for carotid disease include: HLD, HTN and prior smoker.    Past Medical History  Diagnosis Date  . [place] DISEASE, LUMBAR 12/16/2008  . DIVERTICULOSIS, COLON 09/30/2008  . DYSPNEA 07/13/2008  . HYPERLIPIDEMIA 03/06/2007  . HYPERTENSION 03/06/2007  . HYPOTHYROIDISM 10/13/2007  . OSTEOARTHRITIS 03/06/2007  . Personal history of colonic polyps 09/30/2008  . TOBACCO USE, QUIT 04/12/2009  . WEAKNESS 11/09/2007  . Graves disease   . Aortic arch atherosclerosis ([place]) 06/24/2014  . Atherosclerotic ulcer of aorta ([place]) 06/24/2014  . History of embolic stroke [id number]    Left brain    Past Surgical History  Procedure Laterality Date  . Cataract extraction    . Knee surgery    . Cholecystectomy    . Cardiac catheterization    . Tee without cardioversion N/A 06/24/2014    Procedure: TRANSESOPHAGEAL ECHOCARDIOGRAM (TEE);  Surgeon: Sanda Klein, MD;  Location[facility] ENDOSCOPY;  Service: Cardiovascular;  Laterality: N/A;    Social History   Social History  . Marital Status: Divorced    Spouse Name: N/A  . Number of Children: 4  . Years of Education:  college   Occupational History  . Not on file.   Social History Main Topics  . Smoking status: Former Smoker    Quit date: 07/08/1978  . Smokeless tobacco: Never Used  . Alcohol Use: 4.2 oz/week    7 Glasses of wine per week     Comment: "red wine"  . Drug Use: No  . Sexual Activity: Not on file   Other Topics Concern  . Not on file   Social History Narrative   Patient is right handed.   Patient drinks 1 cup caffeine daily.    Family History  Problem Relation Age of Onset  . Cancer Maternal Grandmother     stomach     Current Outpatient Prescriptions  Medication Sig Dispense Refill  . ALPRAZolam (XANAX) 0.5 MG tablet Take 1 tablet (0.5 mg total) by mouth daily as needed for anxiety. 30 tablet 1  . amLODipine (NORVASC) 10 MG tablet Take 1 tablet (10 mg total) by mouth daily at 12 noon. 30 tablet 11  . aspirin 325 MG tablet Take 325 mg by mouth daily.    [name] Kitchen atorvastatin (LIPITOR) 80 MG tablet TAKE 1 TABLET BY MOUTH EVERY DAY AT 6PM (Patient taking differently: TAKE 1 TABLET BY MOUTH daily) 90 tablet 1  . hydrALAZINE (APRESOLINE) 25 MG tablet Take 2 tablets (50 mg total) by mouth 3 (three) times daily.    [name] Kitchen levothyroxine (SYNTHROID, LEVOTHROID) 75 MCG tablet TAKE 1 TABLET BY MOUTH EVERY DAY 90 tablet 1   No current facility-administered medications for this visit.  Allergies  Allergen Reactions  . Lisinopril Anaphylaxis    Tongue swelling  . Labetalol Hcl Other (See Comments)    Caused bradycardia and syncope    REVIEW OF SYSTEMS: (Positives checked otherwise negative)  CARDIOVASCULAR:  [ ]  chest pain,  [ ]  chest pressure,  [ ]  palpitations,  [ ]  shortness of breath when laying flat,  [ ]  shortness of breath with exertion,  [ ]  pain in feet when walking,  [ ]  pain in feet when laying flat, [ ]  history of blood clot in veins (DVT),  [ ]  history of phlebitis,  [ ]  swelling in legs,  [ ]  varicose veins  PULMONARY:  [ ]  productive cough,   [ ]  asthma,  [ ]  wheezing  NEUROLOGIC:  [ ]  weakness in arms or legs,  [ ]  numbness in arms or legs,  [ ]  difficulty speaking or slurred speech,  [ ]  temporary loss of vision in one eye,  [ ]  dizziness  HEMATOLOGIC:  [ ]  bleeding problems,  [ ]  problems with blood clotting too easily  MUSCULOSKEL:  [x]  joint pain, [ ]  joint swelling  GASTROINTEST:  [ ]  vomiting blood,  [ ]  blood in stool   GENITOURINARY:  [ ]  burning with urination,  [ ]  blood in urine  PSYCHIATRIC:  [ ]  history of major depression  INTEGUMENTARY:  [ ]  rashes,  [ ]  ulcers  CONSTITUTIONAL:  [ ]  fever,  [ ]  chills    Physical Examination  Filed Vitals:   10/25/15 0846  Height: 5\' 6"  (1.676 m)  Weight: 150 lb 6.4 oz (68.221 kg)   Body mass index is 24.29 kg/(m^2).  General: A&O x 3, WDWN, appears younger than stated age  Head: [place]/AT  Ear/Nose/Throat: Hearing grossly intact, nares w/o erythema or drainage, oropharynx w/o Erythema/Exudate, Mallampati score: 3  Eyes: PERRLA, EOMI  Neck: Supple, no nuchal rigidity, no palpable LAD, on Sonosite evaluation: L ICA stenosis appears high  Pulmonary: Sym exp, good air movt, CTAB, no rales, rhonchi, & wheezing  Cardiac: RRR, Nl S1, S2, no Murmurs, rubs or gallops  Vascular: Vessel Right Left  Radial Palpable Palpable  Brachial Palpable Palpable  Carotid Palpable, without bruit Palpable, without bruit  Aorta Not palpable N/A  Femoral Palpable Palpable  Popliteal Not palpable Not palpable  PT Not Palpable Not Palpable  DP Not Palpable Not Palpable   Gastrointestinal: soft, NTND, -G/R, - HSM, - masses, - CVAT B  Musculoskeletal: M/S 5/5 throughout , Extremities without ischemic changes   Neurologic: CN 2-12 intact , Pain and light touch intact in extremities , Motor exam as listed above  Psychiatric: Judgment intact, Mood & affect appropriate for pt's clinical  situation  Dermatologic: See M/S exam for extremity exam, no rashes otherwise noted  Lymph : No Cervical, Axillary, or Inguinal lymphadenopathy   Laboratory: CBC:    Component Value Date/Time   WBC 4.6 10/23/2015 1011   RBC 4.02 10/23/2015 1011   HGB 11.8* 10/23/2015 1011   HCT 35.8* 10/23/2015 1011   PLT 193 10/23/2015 1011   MCV 89.1 10/23/2015 1011   MCH 29.4 10/23/2015 1011   MCHC 33.0 10/23/2015 1011   RDW 13.7 10/23/2015 1011   LYMPHSABS 0.8 10/23/2015 1011   MONOABS 0.5 10/23/2015 1011   EOSABS 0.1 10/23/2015 1011   BASOSABS 0.1 10/23/2015 1011    BMP:    Component Value Date/Time   NA 134* 10/23/2015 1011   NA 138 09/13/2015 1103   K 3.7  10/23/2015 1011   CL 102 10/23/2015 1011   CO2 22 10/23/2015 1011   GLUCOSE 128* 10/23/2015 1011   GLUCOSE 143* 09/13/2015 1103   BUN 11 10/23/2015 1011   BUN 23 09/13/2015 1103   CREATININE 1.17* 10/23/2015 1011   CALCIUM 9.0 10/23/2015 1011   GFRNONAA 40* 10/23/2015 1011   GFRAA 47* 10/23/2015 1011    Coagulation: No results found for: INR, PROTIME No results found for: PTT  Lipids:    Component Value Date/Time   CHOL 184 02/15/2015 1122   TRIG 109.0 02/15/2015 1122   HDL 66.70 02/15/2015 1122   CHOLHDL 3 02/15/2015 1122   VLDL 21.8 02/15/2015 1122   LDLCALC 96 02/15/2015 1122   LDLDIRECT 141.7 05/01/2011 1037   CTA Neck (10/09/15) Advanced atherosclerotic plaque of the thoracic aorta and distal aortic arch.  50% diameter stenosis proximal right internal carotid artery  Irregular plaque of the distal left common carotid artery with plaque ulceration. Very irregular plaque involving the proximal left internal carotid artery. Critical 85% stenosis of the proximal left internal carotid artery  Mild atherosclerotic disease in the right vertebral artery without significant vertebral artery stenosis.  Heterogeneous thyroid bilaterally with 11 mm right lower pole nodule. Recommend thyroid  ultrasound.   Non-Invasive Vascular Imaging  L CAROTID DUPLEX (Date: 09/29/2015):   L ICA stenosis: 80-99% (long lesion)  L VA: patent and antegrade   Medical Decision Making  [name] is a 82 y.o. female who presents with: sx L ICA stenosis >80%, asx R ICA stenosis, extensive aortic arch atherosclerosis, labile HTN   This patient has had multiple complications from recent BP regimen titration, leading to admission and ED visit.  I would defer any intervention until BP stabilizes to limit the risk of post-operative hypo/hypertension resulting in watershed infarction and hemorrhagic stroke.  Based on the patient's vascular studies and examination, I have offered the patient: L CEA.  I discussed with the patient the risks, benefits, and alternatives to carotid endarterectomy.    The patient is NOT a candidate for carotid artery stenting due the severity of her ICA and aortic arch disease.  I discussed the procedural details of carotid endarterectomy with the patient.    The patient is aware that the risks of carotid endarterectomy include but are not limited to: bleeding, infection, stroke, myocardial infarction, death, cranial nerve injuries both temporary and permanent, neck hematoma, possible airway compromise, labile blood pressure post-operatively, cerebral hyperperfusion syndrome, and possible need for additional interventions in the future.   I discussed with her risk for periprocedural CVA are elevated due to the extent and severity of her disease and her labile BP which might make BP titration difficult.  The patient is aware of the risks and agrees to proceed forward with the procedure.  She is scheduled for the 8 MAY 17.  I discussed in depth with the patient the nature of atherosclerosis, and emphasized the importance of maximal medical management including strict control of blood pressure, blood glucose, and lipid levels, antiplatelet agents, obtaining regular  exercise, and cessation of smoking.    The patient is aware that without maximal medical management the underlying atherosclerotic disease process will progress, limiting the benefit of any interventions.  The patient is currently on a statin: Lipitor.  The patient is currently on an anti-platelet: ASA.  Thank you for allowing [place] to participate in this patient's care.   Adele Barthel, MD [facility] Office: [phone] Pager: [phone]  10/25/2015,  8:48 AM

## 2015-11-13 NOTE — Interval H&P Note (Signed)
History and Physical Interval Note:  11/13/2015 8:04 AM  [name]  has presented today for surgery, with the diagnosis of Left carotid artery stenosis I65.22  The various methods of treatment have been discussed with the patient and family. After consideration of risks, benefits and other options for treatment, the patient has consented to  Procedure(s): ENDARTERECTOMY CAROTID (Left) as a surgical intervention .  The patient's history has been reviewed, patient examined, no change in status, stable for surgery.  I have reviewed the patient's chart and labs.  Questions were answered to the patient's satisfaction.     Adele Barthel

## 2015-11-13 NOTE — Anesthesia Postprocedure Evaluation (Signed)
Anesthesia Post Note  Patient: [name]  Procedure(s) Performed: Procedure(s) (LRB): LEFT CAROTID ARTERY ENDARTERECTOMY (Left) WITH 1CM X 6CM  XENOSURE BIOLOGIC PATCH ANGIOPLASTY (Left)  Patient location during evaluation: PACU Anesthesia Type: General Level of consciousness: awake and alert Pain management: pain level controlled Vital Signs Assessment: post-procedure vital signs reviewed and stable Respiratory status: spontaneous breathing, nonlabored ventilation, respiratory function stable and patient connected to nasal cannula oxygen Cardiovascular status: blood pressure returned to baseline and stable Postop Assessment: no signs of nausea or vomiting Anesthetic complications: no    Last Vitals:  Filed Vitals:   11/13/15 1345 11/13/15 1400  BP: 102/47 109/48  Pulse: 78 74  Temp:    Resp: 18 15    Last Pain:  Filed Vitals:   11/13/15 1400  PainSc: 0-No pain                 Locklan Canoy,W. EDMOND

## 2015-11-14 ENCOUNTER — Telehealth: Payer: Self-pay | Admitting: Vascular Surgery

## 2015-11-14 ENCOUNTER — Encounter (HOSPITAL_COMMUNITY): Payer: Self-pay | Admitting: Vascular Surgery

## 2015-11-14 LAB — CBC
HEMATOCRIT: 25.6 % — AB (ref 36.0–46.0)
HEMOGLOBIN: 8.3 g/dL — AB (ref 12.0–15.0)
MCH: 29.3 pg (ref 26.0–34.0)
MCHC: 32.4 g/dL (ref 30.0–36.0)
MCV: 90.5 fL (ref 78.0–100.0)
Platelets: 181 10*3/uL (ref 150–400)
RBC: 2.83 MIL/uL — AB (ref 3.87–5.11)
RDW: 14.1 % (ref 11.5–15.5)
WBC: 4.6 10*3/uL (ref 4.0–10.5)

## 2015-11-14 LAB — GLUCOSE, CAPILLARY: GLUCOSE-CAPILLARY: 128 mg/dL — AB (ref 65–99)

## 2015-11-14 LAB — BASIC METABOLIC PANEL
ANION GAP: 8 (ref 5–15)
BUN: 16 mg/dL (ref 6–20)
CHLORIDE: 103 mmol/L (ref 101–111)
CO2: 25 mmol/L (ref 22–32)
CREATININE: 1.21 mg/dL — AB (ref 0.44–1.00)
Calcium: 8.2 mg/dL — ABNORMAL LOW (ref 8.9–10.3)
GFR calc non Af Amer: 39 mL/min — ABNORMAL LOW (ref >60)
GFR, EST AFRICAN AMERICAN: 45 mL/min — AB (ref >60)
Glucose, Bld: 87 mg/dL (ref 65–99)
Potassium: 4 mmol/L (ref 3.5–5.1)
SODIUM: 136 mmol/L (ref 135–145)

## 2015-11-14 LAB — HEMOGLOBIN AND HEMATOCRIT, BLOOD
HEMATOCRIT: 27.9 % — AB (ref 36.0–46.0)
HEMATOCRIT: 32.1 % — AB (ref 36.0–46.0)
HEMOGLOBIN: 9 g/dL — AB (ref 12.0–15.0)
Hemoglobin: 10.5 g/dL — ABNORMAL LOW (ref 12.0–15.0)

## 2015-11-14 LAB — PREPARE RBC (CROSSMATCH)

## 2015-11-14 MED ORDER — SODIUM CHLORIDE 0.9 % IV SOLN: Freq: Once | INTRAVENOUS | Status: DC

## 2015-11-14 NOTE — Progress Notes (Addendum)
  Progress Note    11/14/2015 7:17 AM 1 Day Post-Op  Subjective:  C/o a headache (generalized) and dizzy; no difficulty swallowing  afebrile HR 70's-80's NSR [id number] systolic [id number] ([id number] RA)  Filed Vitals:   11/14/15 0252 11/14/15 0255  BP:  111/55  Pulse:  83  Temp: 98.1 F (36.7 C)   Resp:  16     Physical Exam: Neuro:  In tact; tongue is midline Lungs:  Non labored Incision:  C/d/i   CBC    Component Value Date/Time   WBC 4.6 11/14/2015 0315   RBC 2.83* 11/14/2015 0315   HGB 8.3* 11/14/2015 0315   HCT 25.6* 11/14/2015 0315   PLT 181 11/14/2015 0315   MCV 90.5 11/14/2015 0315   MCH 29.3 11/14/2015 0315   MCHC 32.4 11/14/2015 0315   RDW 14.1 11/14/2015 0315   LYMPHSABS 0.8 10/23/2015 1011   MONOABS 0.5 10/23/2015 1011   EOSABS 0.1 10/23/2015 1011   BASOSABS 0.1 10/23/2015 1011    BMET    Component Value Date/Time   NA 136 11/14/2015 0315   NA 138 09/13/2015 1103   K 4.0 11/14/2015 0315   CL 103 11/14/2015 0315   CO2 25 11/14/2015 0315   GLUCOSE 87 11/14/2015 0315   GLUCOSE 143* 09/13/2015 1103   BUN 16 11/14/2015 0315   BUN 23 09/13/2015 1103   CREATININE 1.21* 11/14/2015 0315   CALCIUM 8.2* 11/14/2015 0315   GFRNONAA 39* 11/14/2015 0315   GFRAA 45* 11/14/2015 0315     Intake/Output Summary (Last 24 hours) at 11/14/15 0717 Last data filed at 11/14/15 0300  Gross per 24 hour  Intake   1665 ml  Output    480 ml  Net   1185 ml     Assessment/Plan:  This is a 82 y.o. female who is s/p left CEA 1 Day Post-Op   -pt neuro exam is in tact & tongue is midline -acute surgical blood loss anemia-pt O2 88% RA and a little dizzy-will transfuse one unit PRBC's  -tube on drain has been changed 2x (on 3rd tube) last changed at 0300 this am-keep in place -pt has not ambulated -pt has voided -will keep another day -f/u with Dr. Bridgett Larsson in 2 weeks.   Leontine Locket, PA-C [facility] [phone]  Addendum  I have  independently interviewed and examined the patient, and I agree with the physician assistant's findings.  Per pt her dizziness was present pre-operatively.  The patient associates this dizziness with a previous episode of anemia and she had started taking iron prior to the procedures.  This suggests the admission HCT was falsely elevated.  Intraoperative operative blood loss was only 150-200 mL so this does not account for a 3 unit change in H/H, again suggesting a false preop H/H.  Otherwise, neuro is intact.  Minimal out TLS  - transfuse 1 u pRBC - Keep TLS one more day as Plavix loaded yesteday - IS x 10 q1 hours - ambulation with PT - Suspect home tomorrow  Adele Barthel, MD [facility] [place] Office: [phone] Pager: [phone]  11/14/2015, 9:00 AM

## 2015-11-14 NOTE — Telephone Encounter (Signed)
sched appt 6/2 at 10. Lm on hm# to inform pt.

## 2015-11-14 NOTE — Telephone Encounter (Signed)
-----   Message from Denman George, RN sent at 11/14/2015  9:50 AM EDT ----- Regarding: needs 2 week f/u with Dr. Bridgett Larsson   ----- Message -----    From: Alvia Grove, PA-C    Sent: 11/13/2015   3:59 PM      To: [facility] Charge Pool  S/p left CEA 11/13/15  F/u with [facility] in 2 weeks  Thanks Maudie Mercury

## 2015-11-14 NOTE — Care Management Note (Signed)
Case Management Note  Patient Details  Name: [name] MRN: [id number] Date of Birth: 22-Dec-1924  Subjective/Objective:   Patient is  s/p left CEA 1 Day Post-Op had ABLA, transfusing, she is from home alone, ambulatory.  NCM will cont to follow for dc needs.                Action/Plan:   Expected Discharge Date:  11/14/15               Expected Discharge Plan:  [place]  In-House Referral:     Discharge planning Services  CM Consult  Post Acute Care Choice:    Choice offered to:     DME Arranged:    DME Agency:     HH Arranged:    HH Agency:     Status of Service:  In process, will continue to follow  Medicare Important Message Given:    Date Medicare IM Given:    Medicare IM give by:    Date Additional Medicare IM Given:    Additional Medicare Important Message give by:     If discussed at [place] of Stay Meetings, dates discussed:    Additional Comments:  Zenon Mayo, RN 11/14/2015, 1:06 PM

## 2015-11-15 ENCOUNTER — Inpatient Hospital Stay (HOSPITAL_COMMUNITY): Payer: Medicare Other

## 2015-11-15 LAB — TYPE AND SCREEN
ABO/RH(D): A NEG
Antibody Screen: NEGATIVE
Unit division: 0

## 2015-11-15 MED ORDER — FUROSEMIDE 10 MG/ML IJ SOLN
20.0000 mg | Freq: Once | INTRAMUSCULAR | Status: AC
  Administered 2015-11-15: 20 mg via INTRAVENOUS
  Filled 2015-11-15: qty 2

## 2015-11-15 MED ORDER — TRAMADOL HCL 50 MG PO TABS
50.0000 mg | ORAL_TABLET | ORAL | Status: DC | PRN
  Administered 2015-11-15 – 2015-11-17 (×4): 50 mg via ORAL
  Filled 2015-11-15 (×4): qty 1

## 2015-11-15 MED ORDER — ENSURE PO LIQD
237.0000 mL | Freq: Three times a day (TID) | ORAL | Status: DC
  Filled 2015-11-15 (×3): qty 237

## 2015-11-15 MED ORDER — ENSURE ENLIVE PO LIQD
237.0000 mL | Freq: Three times a day (TID) | ORAL | Status: DC
  Administered 2015-11-16 – 2015-11-17 (×3): 237 mL via ORAL

## 2015-11-15 NOTE — Progress Notes (Signed)
Pt arrived to unit from 3S.  Telemetry applied and CCMD notified.  Pt and daughter oriented to room including call light and telephone.  Pt appears stable, will cont to monitor.

## 2015-11-15 NOTE — Progress Notes (Signed)
   Daily Progress Note  Assessment/Planning: POD #2 s/p L CEA   Neuro intact  TLS drainage minimal  Greater than anticipated resp to 1 u PRBC transfusion, again emphasizing that the H/H preop likely falsely elevated.  Stable H/H at this point  Stable BP yesterday  Poor tolerance of PO, some nausea overnight  Check CXR  PT/OT  Transfer to floor  Subjective  - 2 Days Post-Op  C/o headache and nausea  Objective Filed Vitals:   11/15/15 0325 11/15/15 0330 11/15/15 0645 11/15/15 0731  BP:  157/60 159/66   Pulse: 81 86 98   Temp: 97.8 F (36.6 C)   97.6 F (36.4 C)  TempSrc: Oral   Oral  Resp: 17 15 19    Height:      Weight:      SpO2: 94% 94% 88%     Intake/Output Summary (Last 24 hours) at 11/15/15 0816 Last data filed at 11/15/15 0600  Gross per 24 hour  Intake   1846 ml  Output   12.5 ml  Net 1833.5 ml    PULM  BLL rales CV  RRR GI  soft, NTND VASC  L neck soft, no hematoma, TLS with minimal serosang drainage NEURO midline tongue, sym motor exam, 5/5 str   Laboratory CBC    Component Value Date/Time   WBC 4.6 11/14/2015 0315   HGB 10.5* 11/14/2015 1400   HCT 32.1* 11/14/2015 1400   PLT 181 11/14/2015 0315    BMET    Component Value Date/Time   NA 136 11/14/2015 0315   NA 138 09/13/2015 1103   K 4.0 11/14/2015 0315   CL 103 11/14/2015 0315   CO2 25 11/14/2015 0315   GLUCOSE 87 11/14/2015 0315   GLUCOSE 143* 09/13/2015 1103   BUN 16 11/14/2015 0315   BUN 23 09/13/2015 1103   CREATININE 1.21* 11/14/2015 0315   CALCIUM 8.2* 11/14/2015 0315   GFRNONAA 39* 11/14/2015 0315   GFRAA 45* 11/14/2015 0315    Adele Barthel, MD [facility] [place]: [phone] Pager: [phone]  11/15/2015, 8:16 AM

## 2015-11-15 NOTE — Evaluation (Signed)
Physical Therapy Evaluation Patient Details Name: [name] MRN: [id number] DOB: 1924/09/12 Today's Date: 11/15/2015   History of Present Illness  Pt is a 82 y/o F s/p Lt carotid endarterectomy.  Pt's PMH includes knee surgery, graves disease, lumbar disc disease, diverticulosis, dyspnea, hypothyroidism, Lt CVA.      Clinical Impression  Pt admitted with above diagnosis. Pt currently with functional limitations due to the deficits listed below (see PT Problem List). [name] presents w/ mild balance impairments and will benefit from HHPT to address these impairments as pt lives alone.  She will, however, have 24/7 assist/superivsion at d/c from family.  SpO2 down to 87% on RA and required 1L O2 for sats to reach low 90s.  Demonstrated and educated pt on pursed lip breathing. Pt will benefit from skilled PT to increase their independence and safety with mobility to allow discharge to the venue listed below.      Follow Up Recommendations Home health PT;Supervision for mobility/OOB    Equipment Recommendations  None recommended by PT    Recommendations for Other Services       Precautions / Restrictions Precautions Precautions: Fall Precaution Comments: monitor O2 Restrictions Weight Bearing Restrictions: No      Mobility  Bed Mobility               General bed mobility comments: Pt sitting in recliner chair upon PT arrival  Transfers Overall transfer level: Needs assistance Equipment used: None Transfers: Sit to/from Stand Sit to Stand: Supervision         General transfer comment: Supervision for safety, +dizziness that clears very quickly.  Ambulation/Gait Ambulation/Gait assistance: Min guard Ambulation Distance (Feet): 200 Feet Assistive device: None Gait Pattern/deviations: Step-through pattern;Decreased stride length;Drifts right/left   Gait velocity interpretation: Below normal speed for age/gender General Gait Details: Drifts Lt and Rt w/ head  turns and min instability noted w/ increased gait speed. However, no overt loss of balance and min guard for safety.  SpO2 down to 87% on RA despite demonstration and cues for pursed lip breathing.  Bumped up to 1L O2 for pt to return sats up to the low 90s.  Stairs            Wheelchair Mobility    Modified Rankin (Stroke Patients Only)       Balance Overall balance assessment: Needs assistance Sitting-balance support: Feet supported;No upper extremity supported Sitting balance-Leahy Scale: Good     Standing balance support: No upper extremity supported;During functional activity Standing balance-Leahy Scale: Fair                   Standardized Balance Assessment Standardized Balance Assessment : Dynamic Gait Index   Dynamic Gait Index Level Surface: Mild Impairment (dec gait speed) Change in Gait Speed: Mild Impairment Gait with Horizontal Head Turns: Mild Impairment Gait with Vertical Head Turns: Mild Impairment       Pertinent Vitals/Pain Pain Assessment: 0-10 Pain Score: 2  Pain Location: headache Pain Descriptors / Indicators: Aching;Discomfort Pain Intervention(s): Limited activity within patient's tolerance;Monitored during session;Patient requesting pain meds-RN notified    Home Living Family/patient expects to be discharged to:: Private residence Living Arrangements: Alone Available Help at Discharge: Family;Available 24 hours/day (family to stay w/ pt in shifts) Type of Home: House (split-level home) Home Access: Stairs to enter Entrance Stairs-Rails: Left Entrance Stairs-Number of Steps: 2 Home Layout: Multi-level Home Equipment: Walker - 2 wheels;Bedside commode Additional Comments: Pt lives in a split level home.  Family  has prepared home for pt to stay in basement.  Pt has option of either walking through her grassy backyard to enter basement w/o steps or to enter her home going up 2 steps and then down 6 steps to her basement.    Prior  Function Level of Independence: Independent               Hand Dominance        Extremity/Trunk Assessment   Upper Extremity Assessment:  (edema Bil hands)           Lower Extremity Assessment: Overall WFL for tasks assessed         Communication   Communication: HOH  Cognition Arousal/Alertness: Awake/alert Behavior During Therapy: WFL for tasks assessed/performed Overall Cognitive Status: Within Functional Limits for tasks assessed                      General Comments      Exercises General Exercises - Upper Extremity Digit Composite Flexion: AROM;Both;10 reps;Seated      Assessment/Plan    PT Assessment Patient needs continued PT services  PT Diagnosis Acute pain   PT Problem List Decreased activity tolerance;Decreased balance;Cardiopulmonary status limiting activity;Decreased safety awareness;Pain  PT Treatment Interventions DME instruction;Gait training;Stair training;Functional mobility training;Therapeutic activities;Therapeutic exercise;Balance training;Patient/family education   PT Goals (Current goals can be found in the Care Plan section) Acute Rehab PT Goals Patient Stated Goal: to go home PT Goal Formulation: With patient/family Time For Goal Achievement: 11/29/15 Potential to Achieve Goals: Good    Frequency Min 3X/week   Barriers to discharge Inaccessible home environment steps to enter home and inside home    Co-evaluation               End of Session Equipment Utilized During Treatment: Gait belt;Oxygen Activity Tolerance: Treatment limited secondary to medical complications (Comment);Patient tolerated treatment well (hypoxia) Patient left: in chair;with call bell/phone within reach;with family/visitor present Nurse Communication: Mobility status;Other (comment) (SpO2)         Time: [id number] PT Time Calculation (min) (ACUTE ONLY): 25 min   Charges:   PT Evaluation $PT Eval Low Complexity: 1 Procedure PT  Treatments $Gait Training: 8-22 mins   PT G Codes:        Collie Siad PT, DPT  Pager: [phone] Phone: [phone] 11/15/2015, 4:16 PM

## 2015-11-16 LAB — BASIC METABOLIC PANEL
ANION GAP: 10 (ref 5–15)
BUN: 10 mg/dL (ref 6–20)
CALCIUM: 8.8 mg/dL — AB (ref 8.9–10.3)
CO2: 29 mmol/L (ref 22–32)
Chloride: 95 mmol/L — ABNORMAL LOW (ref 101–111)
Creatinine, Ser: 1.04 mg/dL — ABNORMAL HIGH (ref 0.44–1.00)
GFR, EST AFRICAN AMERICAN: 54 mL/min — AB (ref >60)
GFR, EST NON AFRICAN AMERICAN: 47 mL/min — AB (ref >60)
GLUCOSE: 119 mg/dL — AB (ref 65–99)
Potassium: 3.4 mmol/L — ABNORMAL LOW (ref 3.5–5.1)
SODIUM: 134 mmol/L — AB (ref 135–145)

## 2015-11-16 LAB — CBC
HCT: 29.7 % — ABNORMAL LOW (ref 36.0–46.0)
Hemoglobin: 9.6 g/dL — ABNORMAL LOW (ref 12.0–15.0)
MCH: 28.7 pg (ref 26.0–34.0)
MCHC: 32.3 g/dL (ref 30.0–36.0)
MCV: 88.7 fL (ref 78.0–100.0)
PLATELETS: 197 10*3/uL (ref 150–400)
RBC: 3.35 MIL/uL — ABNORMAL LOW (ref 3.87–5.11)
RDW: 14.6 % (ref 11.5–15.5)
WBC: 6.9 10*3/uL (ref 4.0–10.5)

## 2015-11-16 MED ORDER — BISACODYL 10 MG RE SUPP
10.0000 mg | Freq: Every day | RECTAL | Status: DC | PRN
  Administered 2015-11-16: 10 mg via RECTAL
  Filled 2015-11-16: qty 1

## 2015-11-16 MED ORDER — FUROSEMIDE 10 MG/ML IJ SOLN
20.0000 mg | Freq: Once | INTRAMUSCULAR | Status: AC
  Administered 2015-11-16: 20 mg via INTRAVENOUS
  Filled 2015-11-16: qty 2

## 2015-11-16 MED ORDER — LACTULOSE 10 GM/15ML PO SOLN
20.0000 g | Freq: Once | ORAL | Status: AC
  Administered 2015-11-16: 20 g via ORAL
  Filled 2015-11-16: qty 30

## 2015-11-16 MED ORDER — POTASSIUM CHLORIDE CRYS ER 20 MEQ PO TBCR
20.0000 meq | EXTENDED_RELEASE_TABLET | Freq: Once | ORAL | Status: AC
  Administered 2015-11-16: 20 meq via ORAL
  Filled 2015-11-16: qty 1

## 2015-11-16 MED ORDER — ALPRAZOLAM 0.25 MG PO TABS
0.2500 mg | ORAL_TABLET | Freq: Two times a day (BID) | ORAL | Status: DC | PRN
  Administered 2015-11-16: 0.25 mg via ORAL
  Filled 2015-11-16: qty 1

## 2015-11-16 NOTE — Progress Notes (Signed)
Physical Therapy Treatment Patient Details Name: [name] MRN: [id number] DOB: [birth year] Today's Date: 11/16/2015    History of Present Illness Pt is a 82 y/o F s/p Lt carotid endarterectomy.  Pt's PMH includes knee surgery, graves disease, lumbar disc disease, diverticulosis, dyspnea, hypothyroidism, Lt CVA.      PT Comments    Initiated stair training with 1 rail and HHA with MIN A.  Pt ambulated with RW and did stairs on RA with o2 sat dropping to 87%.  Once sitting, o2 increased to 92% within 2 minutes. Con't to recommend HHPT.  Follow Up Recommendations  Home health PT;Supervision for mobility/OOB     Equipment Recommendations  None recommended by PT    Recommendations for Other Services       Precautions / Restrictions Precautions Precautions: Fall Precaution Comments: monitor O2 Restrictions Weight Bearing Restrictions: No    Mobility  Bed Mobility               General bed mobility comments: Pt sitting in recliner chair upon PT arrival  Transfers Overall transfer level: Needs assistance Equipment used: Rolling walker (2 wheeled) Transfers: Sit to/from Stand Sit to Stand: Supervision         General transfer comment: cues for hand placement  Ambulation/Gait Ambulation/Gait assistance: Min guard;Supervision Ambulation Distance (Feet): 250 Feet Assistive device: Rolling walker (2 wheeled) Gait Pattern/deviations: Step-through pattern     General Gait Details: Pt requesting to use RW today stating she feels steadier with it.  O2 87-88% on RA after gait and increased to 92% within 2 minutes with pt sitting.   Stairs Stairs: Yes Stairs assistance: Min assist Stair Management: One rail Right;Step to pattern;Forwards Number of Stairs: 5 General stair comments: Amb with rail and HHA with step to pattern.  Educated on importance of family being with her and pt verbalized understanding.  Wheelchair Mobility    Modified Rankin (Stroke Patients  Only)       Balance Overall balance assessment: Needs assistance   Sitting balance-Leahy Scale: Good       Standing balance-Leahy Scale: Fair                      Cognition Arousal/Alertness: Awake/alert Behavior During Therapy: WFL for tasks assessed/performed Overall Cognitive Status: Within Functional Limits for tasks assessed                      Exercises      General Comments General comments (skin integrity, edema, etc.): Reviewed seated therex with pt.  She reports she does some at home.      Pertinent Vitals/Pain Pain Assessment: No/denies pain    Home Living                      Prior Function            PT Goals (current goals can now be found in the care plan section) Acute Rehab PT Goals Patient Stated Goal: to go home PT Goal Formulation: With patient/family Time For Goal Achievement: 11/29/15 Potential to Achieve Goals: Good Progress towards PT goals: Progressing toward goals    Frequency  Min 3X/week    PT Plan Current plan remains appropriate    Co-evaluation             End of Session Equipment Utilized During Treatment: Gait belt Activity Tolerance: Patient tolerated treatment well;Other (comment) (de-sat on RA with gait and stairs) Patient left:  in chair;with call bell/phone within reach     Time: 1240-1254 PT Time Calculation (min) (ACUTE ONLY): 14 min  Charges:  $Gait Training: 8-22 mins                    G Codes:      [name] 11/16/2015, 1:30 PM

## 2015-11-16 NOTE — Care Management Important Message (Signed)
Important Message  Patient Details  Name: [name] MRN: [id number] Date of Birth: 12-Apr-1925   Medicare Important Message Given:  Yes    Terissa Haffey Abena 11/16/2015, 1:12 PM

## 2015-11-16 NOTE — Evaluation (Signed)
Occupational Therapy Evaluation Patient Details Name: [name] MRN: [id number] DOB: Jul 23, 1924 Today's Date: 11/16/2015    History of Present Illness Pt is a 82 y/o F s/p Lt carotid endarterectomy.  Pt's PMH includes knee surgery, graves disease, lumbar disc disease, diverticulosis, dyspnea, hypothyroidism, Lt CVA.     Clinical Impression   Pt was admitted for the above. She will benefit from continued OT to increase safety and independence with adls.  Pt is very independent at baseline and needs reinforcement with energy conservation.     Follow Up Recommendations  Home health OT;Supervision/Assistance - 24 hour    Equipment Recommendations  None recommended by OT (borrowing 3:1)    Recommendations for Other Services       Precautions / Restrictions Precautions Precautions: Fall Precaution Comments: monitor O2 Restrictions Weight Bearing Restrictions: No      Mobility Bed Mobility               General bed mobility comments: Pt sitting in recliner chair   Transfers Overall transfer level: Needs assistance Equipment used: Rolling walker (2 wheeled) Transfers: Sit to/from Stand Sit to Stand: Supervision         General transfer comment: cues for hand placement    Balance Overall balance assessment: Needs assistance   Sitting balance-Leahy Scale: Good       Standing balance-Leahy Scale: Fair                              ADL Overall ADL's : Needs assistance/impaired                         Toilet Transfer: Min guard;Ambulation;Regular Toilet;RW             General ADL Comments: pt can cross legs to complete adl with set up/supervision sit to stand. She tends to move quickly and needs to rest.  Educated on energy conservation, especially taking rest breaks; sitting and standing rest breaks.  Pt is very independent natured and will need reinforcement with this.  Discussed 3:1 by bedside initially at night.  She plans to  sponge bathe until her endurance improves     Vision     Perception     Praxis      Pertinent Vitals/Pain Pain Assessment: No/denies pain     Hand Dominance     Extremity/Trunk Assessment Upper Extremity Assessment Upper Extremity Assessment: Overall WFL for tasks assessed           Communication Communication Communication: HOH   Cognition Arousal/Alertness: Awake/alert Behavior During Therapy: WFL for tasks assessed/performed Overall Cognitive Status: Within Functional Limits for tasks assessed                     General Comments       Exercises       Shoulder Instructions      Home Living Family/patient expects to be discharged to:: Private residence Living Arrangements: Alone Available Help at Discharge: Family;Available 24 hours/day               Bathroom Shower/Tub: Occupational psychologist: Standard     Home Equipment: Environmental consultant - 2 wheels;Bedside commode   Additional Comments: Pt lives in a split level home.  Family has prepared home for pt to stay in basement.  Pt has option of either walking through her grassy backyard to enter basement w/o steps  or to enter her home going up 2 steps and then down 6 steps to her basement.  borrowing 3:1 commode      Prior Functioning/Environment Level of Independence: Independent             OT Diagnosis: Generalized weakness   OT Problem List: Decreased strength;Decreased activity tolerance;Impaired balance (sitting and/or standing)   OT Treatment/Interventions: Self-care/ADL training;DME and/or AE instruction;Energy conservation;Patient/family education    OT Goals(Current goals can be found in the care plan section) Acute Rehab OT Goals Patient Stated Goal: to go home OT Goal Formulation: With patient Time For Goal Achievement: 11/23/15 Potential to Achieve Goals: Good ADL Goals Pt Will Transfer to Toilet: with supervision;ambulating;regular height toilet Additional ADL Goal  #1: pt will complete ADL with set up and initiate at least one rest break for energy conservation  OT Frequency: Min 2X/week   Barriers to D/C:            Co-evaluation              End of Session    Activity Tolerance: Patient tolerated treatment well Patient left: in chair;with call bell/phone within reach   Time: [id number] OT Time Calculation (min): 18 min Charges:  OT General Charges $OT Visit: 1 Procedure OT Evaluation $OT Eval Low Complexity: 1 Procedure G-Codes:    Daril Warga Dec 09, 2015, 2:32 PM Lesle Chris, OTR/L [phone] 2015/12/09

## 2015-11-16 NOTE — Progress Notes (Addendum)
  [facility] Progress Note  Subjective  - POD #3  Had some nausea earlier. Still with a headache but somewhat improved. No shortness of breath.  Has no appetite. Feels like there is a lot of "mucus in her throat."  Objective Filed Vitals:   11/15/15 2118 11/16/15 0148  BP: 152/57 151/57  Pulse: 86 82  Temp: 98.6 F (37 C) 97.6 F (36.4 C)  Resp: 16 16   Tmax 98.6 BP sys 150s-160s 02 93% 1L  Intake/Output Summary (Last 24 hours) at 11/16/15 [id number] Last data filed at 11/16/15 0716  Gross per 24 hour  Intake      3 ml  Output   1200 ml  Net  -1197 ml   Mild rales bilateral lower lungs.  Left neck without hematoma Tongue midline, smile symmetric 5/5 strength upper and lower extremities  Assessment/Planning: 82 y.o. female is s/p: left CEA 3 Days Post-Op   Neuro exam intact.  BP stable.  Atelectasis on CXR, minimal b/l pleural effusions. Had 20 mg lasix x 1 yesterday. Give one more dose today given rales on exam. Continue IS, ordered flutter valve.  Replete K+ No swallowing issues. Still with nausea. Continue to mobilize.  Plan d/c home tomorrow.   Alvia Grove 11/16/2015 8:22 AM --  Laboratory CBC    Component Value Date/Time   WBC 6.9 11/16/2015 0431   HGB 9.6* 11/16/2015 0431   HCT 29.7* 11/16/2015 0431   PLT 197 11/16/2015 0431    BMET    Component Value Date/Time   NA 134* 11/16/2015 0431   NA 138 09/13/2015 1103   K 3.4* 11/16/2015 0431   CL 95* 11/16/2015 0431   CO2 29 11/16/2015 0431   GLUCOSE 119* 11/16/2015 0431   GLUCOSE 143* 09/13/2015 1103   BUN 10 11/16/2015 0431   BUN 23 09/13/2015 1103   CREATININE 1.04* 11/16/2015 0431   CALCIUM 8.8* 11/16/2015 0431   GFRNONAA 47* 11/16/2015 0431   GFRAA 54* 11/16/2015 0431    COAG Lab Results  Component Value Date   INR 1.11 11/06/2015   No results found for: PTT  Antibiotics Anti-infectives    Start     Dose/Rate Route Frequency Ordered Stop   11/13/15 2000   cefUROXime (ZINACEF) 1.5 g in dextrose 5 % 50 mL IVPB     1.5 g 100 mL/hr over 30 Minutes Intravenous Every 12 hours 11/13/15 1450 11/14/15 0901   11/13/15 0551  cefUROXime (ZINACEF) 1.5 g in dextrose 5 % 50 mL IVPB     1.5 g 100 mL/hr over 30 Minutes Intravenous 30 min pre-op 11/13/15 0551 11/13/15 0848       Virgina Jock, PA-C [facility] Office: [phone] Pager: [phone] 11/16/2015 8:22 AM     Addendum  I have independently interviewed and examined the patient, and I agree with the physician assistant's findings.  WBC ok.  Good response to Lasix.  Pt still having HA.  Nausea better.  Neuro exam intact.  - Keep another day given some concerns with mild cerebral hyperperfusion. - D/C tomorrow   Adele Barthel, MD [facility] [place] Office: [phone] Pager: [phone]  11/16/2015, 9:27 AM

## 2015-11-16 NOTE — Care Management Note (Addendum)
Case Management Note Previous CM note initiated by Tomi Bamberger RN, CM  Patient Details  Name: [name] MRN: [id number] Date of Birth: [birth year]  Subjective/Objective:   Patient is  s/p left CEA 1 Day Post-Op had ABLA, transfusing, she is from home alone, ambulatory.  NCM will cont to follow for dc needs.                Action/Plan:   Expected Discharge Date:  11/17/15              Expected Discharge Plan:  [place]  In-House Referral:     Discharge planning Services  CM Consult  Post Acute Care Choice:  Home Health Choice offered to:  Patient  DME Arranged:  N/A DME Agency:  NA  HH Arranged:  PT [place] Agency:  [place]  Status of Service:  Completed, signed off  Medicare Important Message Given:  Yes Date Medicare IM Given:    Medicare IM give by:    Date Additional Medicare IM Given:    Additional Medicare Important Message give by:     If discussed at [place] of Stay Meetings, dates discussed:    Additional Comments:  11/16/15- 1200- Marvetta Gibbons RN, BSN - pt tx from 3S to 3W- orders for DME and HH- in to speak with pt at bedside- choice offered for [facility] in [facility]- list provided to pt. Per pt choice she would like to use [facility] for [facility] services- referral called to [facility] with [facility]- for HH-PT- discussed with pt DME need- pt states that she already has shower stool and does not need another, also has RW and elevated toilet seat/BSC, no other DME needs noted. Possible d/c tomorrow - home with family.   Dawayne Patricia, RN 11/16/2015, 1:51 PM

## 2015-11-16 NOTE — Progress Notes (Signed)
RN gave pt flutter valve and instructed pt inuse.

## 2015-11-17 ENCOUNTER — Other Ambulatory Visit: Payer: Self-pay | Admitting: Cardiovascular Disease

## 2015-11-17 DIAGNOSIS — E039 Hypothyroidism, unspecified: Secondary | ICD-10-CM

## 2015-11-17 DIAGNOSIS — Z8673 Personal history of transient ischemic attack (TIA), and cerebral infarction without residual deficits: Secondary | ICD-10-CM

## 2015-11-17 DIAGNOSIS — F419 Anxiety disorder, unspecified: Secondary | ICD-10-CM

## 2015-11-17 DIAGNOSIS — R911 Solitary pulmonary nodule: Secondary | ICD-10-CM

## 2015-11-17 DIAGNOSIS — R0989 Other specified symptoms and signs involving the circulatory and respiratory systems: Secondary | ICD-10-CM

## 2015-11-17 DIAGNOSIS — I739 Peripheral vascular disease, unspecified: Secondary | ICD-10-CM

## 2015-11-17 DIAGNOSIS — I779 Disorder of arteries and arterioles, unspecified: Secondary | ICD-10-CM

## 2015-11-17 DIAGNOSIS — E785 Hyperlipidemia, unspecified: Secondary | ICD-10-CM

## 2015-11-17 MED ORDER — ACETAMINOPHEN 325 MG PO TABS: 325.0000 mg | ORAL_TABLET | Freq: Four times a day (QID) | ORAL | Status: DC | PRN

## 2015-11-17 MED ORDER — CLOPIDOGREL BISULFATE 75 MG PO TABS: 75.0000 mg | ORAL_TABLET | Freq: Every day | ORAL | Status: DC

## 2015-11-17 MED ORDER — HYDRALAZINE HCL 50 MG PO TABS: 75.0000 mg | ORAL_TABLET | Freq: Three times a day (TID) | ORAL | Status: DC

## 2015-11-17 MED ORDER — AMLODIPINE BESYLATE 10 MG PO TABS: 10.0000 mg | ORAL_TABLET | Freq: Every day | ORAL | Status: DC

## 2015-11-17 NOTE — Progress Notes (Signed)
   Daily Progress Note  Assessment/Planning: POD #4 s/p L CEA   No hematoma  Neuro intact  Tolerating RA  URI sx  HA sx better  Ok to D/C  F/U in 2 weeks   Subjective  - 4 Days Post-Op  C/o constipation -> diarrhea after laxatives, still coughing  Objective Filed Vitals:   11/16/15 2025 11/16/15 2207 11/17/15 0551 11/17/15 0715  BP: 160/48 146/65 157/66   Pulse: 80  87   Temp: 97.6 F (36.4 C)  97.9 F (36.6 C)   TempSrc: Oral  Oral   Resp: 18  18   Height:      Weight:      SpO2: 94%  94% 93%    Intake/Output Summary (Last 24 hours) at 11/17/15 0821 Last data filed at 11/16/15 1630  Gross per 24 hour  Intake    600 ml  Output     40 ml  Net    560 ml    PULM  CTAB CV  RRR GI  soft, NTND VASC  L neck inc c/d/i NEURO CN intact, sym MS 5/5  Laboratory CBC    Component Value Date/Time   WBC 6.9 11/16/2015 0431   HGB 9.6* 11/16/2015 0431   HCT 29.7* 11/16/2015 0431   PLT 197 11/16/2015 0431    BMET    Component Value Date/Time   NA 134* 11/16/2015 0431   NA 138 09/13/2015 1103   K 3.4* 11/16/2015 0431   CL 95* 11/16/2015 0431   CO2 29 11/16/2015 0431   GLUCOSE 119* 11/16/2015 0431   GLUCOSE 143* 09/13/2015 1103   BUN 10 11/16/2015 0431   BUN 23 09/13/2015 1103   CREATININE 1.04* 11/16/2015 0431   CALCIUM 8.8* 11/16/2015 0431   GFRNONAA 47* 11/16/2015 [place] 54* 11/16/2015 [place], MD [facility] [place]: [phone] Pager: [phone]  11/17/2015, 8:21 AM

## 2015-11-17 NOTE — Telephone Encounter (Signed)
Ms. Bourdier daughter called to report that she will not have enough hydralazine tablets to last through the weekend.  I refilled her prescription for 75mg  PO TID, which will now be dispensed as 50mg  tabs instead of 25mg  tabs for ease of use.

## 2015-11-17 NOTE — Discharge Summary (Signed)
Discharge Summary     [name] 15-Aug-1924 82 y.o. female  [id number]  Admission Date: 11/13/2015  Discharge Date: 11/17/15  Physician: Conrad Makemie Park, MD  Admission Diagnosis: Left carotid artery stenosis I65.22   HPI:   This is a 82 y.o. female who presents with chief complaint: follow up on CTA. Pt has been cleared by Cardiology in the interval time period. This patient previously had a L parietal stroke in Dec 2015. It was felt based on her work-up that she had embolic phenomena from her aortic arch. Reported outside carotid studies demonstrated a left high grade stenosis >80%. The patient was not referred to a vascular surgeon until recently. Patient has known history of TIA: right hand weakness and paraesthesia. The patient has never had amaurosis fugax or monocular blindness. The patient has never had facial drooping or hemiplegia. The patient has never had receptive or expressive aphasia. he patient's previous neurologic deficits have resolved. The patient's risks factors for carotid disease include: HLD, HTN and prior smoker.   Hospital Course:  The patient was admitted to the hospital and taken to the operating room on 11/13/2015 and underwent left carotid endarterectomy.  The pt tolerated the procedure well and was transported to the PACU in good condition.   By POD 1, the pt neuro status is in tact.  She did have a drain (TLS) and the tube had been changed 2x and on the 3rd tube.  This was able to be removed on POD 2.  She did have an acute surgical blood loss anemia and her oxygen saturation was 88% on room air and she has a little dizziness.  She was transfused one unit of PRBC's.    On POD 2, she was having some nausea and had poor po intake.  She did have a CXR, which revealed mild bibasilar subsegmental atelectasis or edema noted with minimal associated bilateral pleural effusions.    By POD 3, she did receive a couple of doses of lasix 20mg  IV.  She  continued her IS.  She did not have any swallowing difficulties.  She was continuing to have a mild headache and was kept another day given some concerns with mild cerebral hyperperfusion.  A PT consult was obtained and HHPT was recommended.  Her HA sx were improved.  She is discharged home on POD 4.    She will be placed on Plavix x 1 month and an Rx was electronically sent to her pharmacy.  The remainder of the hospital course consisted of increasing mobilization and increasing intake of solids without difficulty.    Recent Labs  11/16/15 0431  NA 134*  K 3.4*  CL 95*  CO2 29  GLUCOSE 119*  BUN 10  CALCIUM 8.8*    Recent Labs  11/14/15 1400 11/16/15 0431  WBC  --  6.9  HGB 10.5* 9.6*  HCT 32.1* 29.7*  PLT  --  197   No results for input(s): INR in the last 72 hours.       Discharge Instructions    CAROTID Sug[place]: Call MD for difficulty swallowing or speaking; weakness in arms or legs that is a new symtom; severe headache.  If you have increased swelling in the neck and/or  are having difficulty breathing, CALL [phone]    Complete by:  As directed      Call MD for:  redness, tenderness, or signs of infection (pain, swelling, bleeding, redness, odor or green/yellow discharge around incision site)  Complete by:  As directed      Call MD for:  severe or increased pain, loss or decreased feeling  in affected limb(s)    Complete by:  As directed      Call MD for:  temperature >100.5    Complete by:  As directed      Discharge wound care:    Complete by:  As directed   Shower daily with soap and water starting 11/17/15     Driving Restrictions    Complete by:  As directed   No driving for 2 weeks     Lifting restrictions    Complete by:  As directed   No lifting for 2 weeks     Resume previous diet    Complete by:  As directed            Discharge Diagnosis:  Left carotid artery stenosis I65.22  Secondary Diagnosis: Patient Active Problem List   Diagnosis Date  Noted  . Asymptomatic carotid artery stenosis 11/13/2015  . Impaired glucose tolerance 10/27/2015  . Solitary pulmonary nodule 10/27/2015  . Thyroid nodule 10/27/2015  . Syncope 10/04/2015  . Bradycardia with less than 60 beats per minute 10/04/2015  . Paresthesia 10/03/2014  . History of embolic stroke [id number]  . Paresthesia of both feet 07/06/2014  . Aortic arch atherosclerosis ([place]) 06/24/2014  . Atherosclerotic ulcer of aorta ([place]) 06/24/2014  . Stroke ([place]) 06/22/2014  . CVA (cerebral infarction) 06/22/2014  . Bilateral carotid artery disease ([place]) 05/18/2014  . TOBACCO USE, QUIT 04/12/2009  . [place] DISEASE, LUMBAR 12/16/2008  . DIVERTICULOSIS, COLON 09/30/2008  . Personal history of colonic polyps 09/30/2008  . DYSPNEA 07/13/2008  . WEAKNESS 11/09/2007  . Hypothyroidism 10/13/2007  . Hyperlipidemia 03/06/2007  . Essential hypertension 03/06/2007  . Osteoarthritis 03/06/2007   Past Medical History  Diagnosis Date  . [place] DISEASE, LUMBAR 12/16/2008  . DIVERTICULOSIS, COLON 09/30/2008  . DYSPNEA 07/13/2008  . HYPERLIPIDEMIA 03/06/2007  . HYPERTENSION 03/06/2007  . HYPOTHYROIDISM 10/13/2007  . OSTEOARTHRITIS 03/06/2007  . Personal history of colonic polyps 09/30/2008  . TOBACCO USE, QUIT 04/12/2009  . WEAKNESS 11/09/2007  . Graves disease   . Aortic arch atherosclerosis ([place]) 06/24/2014  . Atherosclerotic ulcer of aorta ([place]) 06/24/2014  . History of embolic stroke [id number]    Left brain  . Stroke ([place])     06/2014           . Anxiety       Medication List    TAKE these medications        acetaminophen 325 MG tablet  Commonly known as:  TYLENOL  Take 1 tablet (325 mg total) by mouth every 6 (six) hours as needed.     ALPRAZolam 0.5 MG tablet  Commonly known as:  XANAX  Take 1 tablet (0.5 mg total) by mouth daily as needed for anxiety.     amLODipine 10 MG tablet  Commonly known as:  NORVASC  Take 1 tablet (10 mg total) by mouth daily.     aspirin 325 MG tablet   Take 325 mg by mouth daily.     atorvastatin 80 MG tablet  Commonly known as:  LIPITOR  TAKE 1 TABLET BY MOUTH EVERY DAY AT 6PM     clopidogrel 75 MG tablet  Commonly known as:  PLAVIX  Take 1 tablet (75 mg total) by mouth daily.     hydrALAZINE 25 MG tablet  Commonly known as:  APRESOLINE  Take 3 tablets (75  mg total) by mouth 3 (three) times daily.     levothyroxine 75 MCG tablet  Commonly known as:  SYNTHROID, LEVOTHROID  TAKE 1 TABLET BY MOUTH EVERY DAY        Prescriptions given: 1.  Plavix 75mg  daily x 1 month #30 NR 2.  Tylenol OTC  Disposition: home  Patient's condition: is Good  Follow up: 1. Dr. Bridgett Larsson in 2 weeks. 2. Dr. Johnsie Cancel in 1-2 weeks for BP management and medication adjustment 3. Dr. Kennieth Rad in 1 week for anemia check and upper respiratory sx   Gloria Locket, PA-C [facility] [phone]  Addendum  I agree with the physician assistant's discharge summary.  This patient underwent a long left carotid endarterectomy for extensive plaque in the common carotid artery extending into the internal carotid artery.  Her post-operative course was notable for need for 1 u pRBC transfusion due to baseline anemia.  She has needed several days to resolve atelectiasis related to her ventilation.  She also had mild elements of cerebral hyperperfusion.  Given this issues and elevated age, she was kept in the hospital for a longer period than usual.  She was completely neurologically intact, without any neck hematoma, and resolved atelectiasis and headache by discharge.  She will follow up in the office in 2 weeks.  Adele Barthel, MD [facility] [place] Office: [phone] Pager: [phone]  11/20/2015, 7:36 AM   --- For VQI Registry use --- Instructions: Press F2 to tab through selections.  Delete question if not applicable.   Modified Rankin score at D/C (0-6): 0  IV medication needed for:  1. Hypertension: No 2.  Hypotension: No  Post-op Complications: No  1. Post-op CVA or TIA: No  If yes: Event classification (right eye, left eye, right cortical, left cortical, verterobasilar, other): n/a  If yes: Timing of event (intra-op, <6 hrs post-op, >=6 hrs post-op, unknown): n/a  2. CN injury: No  If yes: CN n/a injuried   3. Myocardial infarction: No  If yes: Dx by (EKG or clinical, Troponin): n/a  4.  CHF: No  5.  Dysrhythmia (new): No  6. Wound infection: No  7. Reperfusion symptoms: Possibly-persistent post operative HA  8. Return to OR: No  If yes: return to OR for (bleeding, neurologic, other CEA incision, other): n/a  Discharge medications: Statin use:  Yes If No: [ ]  For Medical reasons, [ ]  Non-compliant, [ ]  Not-indicated ASA use:  Yes  If No: [ ]  For Medical reasons, [ ]  Non-compliant, [ ]  Not-indicated Beta blocker use:  No If No: [ ]  For Medical reasons, [ ]  Non-compliant, [ ]  Not-indicated ACE-Inhibitor use:  No If No: [ ]  For Medical reasons, [ ]  Non-compliant, [ ]  Not-indicated ARB use:  No P2Y12 Antagonist use: Yes, [x]  Plavix x 1 month, [ ]  Plasugrel, [ ]  Ticlopinine, [ ]  Ticagrelor, [ ]  Other, [ ]  No for medical reason, [ ]  Non-compliant, [ ]  Not-indicated Anti-coagulant use:  No, [ ]  Warfarin, [ ]  Rivaroxaban, [ ]  Dabigatran, [ ]  Other, [ ]  No for medical reason, [ ]  Non-compliant, [ ]  Not-indicated

## 2015-11-20 ENCOUNTER — Ambulatory Visit: Payer: Medicare Other | Admitting: Pharmacist

## 2015-11-21 ENCOUNTER — Inpatient Hospital Stay (HOSPITAL_COMMUNITY): Payer: Medicare Other

## 2015-11-21 ENCOUNTER — Telehealth: Payer: Self-pay | Admitting: Internal Medicine

## 2015-11-21 ENCOUNTER — Encounter (HOSPITAL_COMMUNITY): Payer: Self-pay | Admitting: Emergency Medicine

## 2015-11-21 ENCOUNTER — Telehealth: Payer: Self-pay | Admitting: *Deleted

## 2015-11-21 ENCOUNTER — Observation Stay (HOSPITAL_COMMUNITY)
Admission: EM | Admit: 2015-11-21 | Discharge: 2015-11-23 | Disposition: A | Discharge status: Home / Self Care | Payer: Medicare Other | Attending: Vascular Surgery | Admitting: Vascular Surgery

## 2015-11-21 ENCOUNTER — Emergency Department (HOSPITAL_COMMUNITY): Payer: Medicare Other

## 2015-11-21 DIAGNOSIS — E039 Hypothyroidism, unspecified: Secondary | ICD-10-CM | POA: Insufficient documentation

## 2015-11-21 DIAGNOSIS — Z79899 Other long term (current) drug therapy: Secondary | ICD-10-CM | POA: Diagnosis not present

## 2015-11-21 DIAGNOSIS — E785 Hyperlipidemia, unspecified: Secondary | ICD-10-CM | POA: Diagnosis not present

## 2015-11-21 DIAGNOSIS — Z8673 Personal history of transient ischemic attack (TIA), and cerebral infarction without residual deficits: Secondary | ICD-10-CM | POA: Diagnosis not present

## 2015-11-21 DIAGNOSIS — F419 Anxiety disorder, unspecified: Secondary | ICD-10-CM | POA: Insufficient documentation

## 2015-11-21 DIAGNOSIS — R4781 Slurred speech: Secondary | ICD-10-CM | POA: Diagnosis not present

## 2015-11-21 DIAGNOSIS — I671 Cerebral aneurysm, nonruptured: Secondary | ICD-10-CM | POA: Diagnosis not present

## 2015-11-21 DIAGNOSIS — I10 Essential (primary) hypertension: Secondary | ICD-10-CM | POA: Insufficient documentation

## 2015-11-21 DIAGNOSIS — Z87891 Personal history of nicotine dependence: Secondary | ICD-10-CM | POA: Diagnosis not present

## 2015-11-21 DIAGNOSIS — I63412 Cerebral infarction due to embolism of left middle cerebral artery: Secondary | ICD-10-CM

## 2015-11-21 DIAGNOSIS — Z9889 Other specified postprocedural states: Secondary | ICD-10-CM | POA: Insufficient documentation

## 2015-11-21 DIAGNOSIS — R4701 Aphasia: Secondary | ICD-10-CM | POA: Diagnosis not present

## 2015-11-21 DIAGNOSIS — R2990 Unspecified symptoms and signs involving the nervous system: Secondary | ICD-10-CM

## 2015-11-21 DIAGNOSIS — I6522 Occlusion and stenosis of left carotid artery: Secondary | ICD-10-CM | POA: Diagnosis present

## 2015-11-21 DIAGNOSIS — I700 Atherosclerosis of aorta: Secondary | ICD-10-CM | POA: Insufficient documentation

## 2015-11-21 DIAGNOSIS — G459 Transient cerebral ischemic attack, unspecified: Secondary | ICD-10-CM

## 2015-11-21 LAB — COMPREHENSIVE METABOLIC PANEL
ALK PHOS: 142 U/L — AB (ref 38–126)
ALT: 76 U/L — ABNORMAL HIGH (ref 14–54)
ANION GAP: 9 (ref 5–15)
AST: 36 U/L (ref 15–41)
Albumin: 3.4 g/dL — ABNORMAL LOW (ref 3.5–5.0)
BILIRUBIN TOTAL: 0.7 mg/dL (ref 0.3–1.2)
BUN: 11 mg/dL (ref 6–20)
CALCIUM: 8.9 mg/dL (ref 8.9–10.3)
CO2: 27 mmol/L (ref 22–32)
Chloride: 97 mmol/L — ABNORMAL LOW (ref 101–111)
Creatinine, Ser: 1.11 mg/dL — ABNORMAL HIGH (ref 0.44–1.00)
GFR calc non Af Amer: 43 mL/min — ABNORMAL LOW (ref >60)
GFR, EST AFRICAN AMERICAN: 50 mL/min — AB (ref >60)
Glucose, Bld: 138 mg/dL — ABNORMAL HIGH (ref 65–99)
Potassium: 3.5 mmol/L (ref 3.5–5.1)
Sodium: 133 mmol/L — ABNORMAL LOW (ref 135–145)
TOTAL PROTEIN: 6.6 g/dL (ref 6.5–8.1)

## 2015-11-21 LAB — PROTIME-INR
INR: 1.04 (ref 0.00–1.49)
INR: 1.12 (ref 0.00–1.49)
PROTHROMBIN TIME: 13.8 s (ref 11.6–15.2)
PROTHROMBIN TIME: 14.6 s (ref 11.6–15.2)

## 2015-11-21 LAB — URINALYSIS, ROUTINE W REFLEX MICROSCOPIC
Bilirubin Urine: NEGATIVE
GLUCOSE, UA: NEGATIVE mg/dL
HGB URINE DIPSTICK: NEGATIVE
Ketones, ur: NEGATIVE mg/dL
Leukocytes, UA: NEGATIVE
Nitrite: NEGATIVE
PH: 7.5 (ref 5.0–8.0)
PROTEIN: NEGATIVE mg/dL
Specific Gravity, Urine: 1.011 (ref 1.005–1.030)

## 2015-11-21 LAB — DIFFERENTIAL
Basophils Absolute: 0 10*3/uL (ref 0.0–0.1)
Basophils Relative: 1 %
EOS PCT: 1 %
Eosinophils Absolute: 0.1 10*3/uL (ref 0.0–0.7)
LYMPHS ABS: 1.3 10*3/uL (ref 0.7–4.0)
LYMPHS PCT: 15 %
MONO ABS: 1 10*3/uL (ref 0.1–1.0)
MONOS PCT: 12 %
Neutro Abs: 6.1 10*3/uL (ref 1.7–7.7)
Neutrophils Relative %: 71 %

## 2015-11-21 LAB — CBC
HCT: 36.4 % (ref 36.0–46.0)
HEMOGLOBIN: 11.9 g/dL — AB (ref 12.0–15.0)
MCH: 29.1 pg (ref 26.0–34.0)
MCHC: 32.7 g/dL (ref 30.0–36.0)
MCV: 89 fL (ref 78.0–100.0)
Platelets: 261 10*3/uL (ref 150–400)
RBC: 4.09 MIL/uL (ref 3.87–5.11)
RDW: 14.2 % (ref 11.5–15.5)
WBC: 8.5 10*3/uL (ref 4.0–10.5)

## 2015-11-21 LAB — I-STAT TROPONIN, ED: Troponin i, poc: 0.02 ng/mL (ref 0.00–0.08)

## 2015-11-21 LAB — CBG MONITORING, ED: GLUCOSE-CAPILLARY: 137 mg/dL — AB (ref 65–99)

## 2015-11-21 LAB — APTT: aPTT: 28 seconds (ref 24–37)

## 2015-11-21 MED ORDER — GUAIFENESIN-DM 100-10 MG/5ML PO SYRP: 15.0000 mL | ORAL_SOLUTION | ORAL | Status: DC | PRN

## 2015-11-21 MED ORDER — LEVOTHYROXINE SODIUM 75 MCG PO TABS
75.0000 ug | ORAL_TABLET | Freq: Every day | ORAL | Status: DC
  Administered 2015-11-23: 75 ug via ORAL
  Filled 2015-11-21 (×2): qty 1

## 2015-11-21 MED ORDER — ALUM & MAG HYDROXIDE-SIMETH 200-200-20 MG/5ML PO SUSP: 15.0000 mL | ORAL | Status: DC | PRN

## 2015-11-21 MED ORDER — PHENOL 1.4 % MT LIQD: 1.0000 | OROMUCOSAL | Status: DC | PRN

## 2015-11-21 MED ORDER — AMLODIPINE BESYLATE 10 MG PO TABS
10.0000 mg | ORAL_TABLET | Freq: Every day | ORAL | Status: DC
  Administered 2015-11-22 – 2015-11-23 (×2): 10 mg via ORAL
  Filled 2015-11-21 (×2): qty 1

## 2015-11-21 MED ORDER — ONDANSETRON HCL 4 MG/2ML IJ SOLN: 4.0000 mg | Freq: Four times a day (QID) | INTRAMUSCULAR | Status: DC | PRN

## 2015-11-21 MED ORDER — DOCUSATE SODIUM 100 MG PO CAPS
100.0000 mg | ORAL_CAPSULE | Freq: Two times a day (BID) | ORAL | Status: DC
  Administered 2015-11-22 – 2015-11-23 (×2): 100 mg via ORAL
  Filled 2015-11-21 (×3): qty 1

## 2015-11-21 MED ORDER — IOPAMIDOL (ISOVUE-370) INJECTION 76%
INTRAVENOUS | Status: AC
  Administered 2015-11-21: 50 mL
  Filled 2015-11-21: qty 50

## 2015-11-21 MED ORDER — ENOXAPARIN SODIUM 40 MG/0.4ML ~~LOC~~ SOLN
40.0000 mg | SUBCUTANEOUS | Status: DC
  Administered 2015-11-21 – 2015-11-22 (×2): 40 mg via SUBCUTANEOUS
  Filled 2015-11-21 (×2): qty 0.4

## 2015-11-21 MED ORDER — CLOPIDOGREL BISULFATE 75 MG PO TABS
75.0000 mg | ORAL_TABLET | Freq: Every day | ORAL | Status: DC
  Administered 2015-11-22 – 2015-11-23 (×2): 75 mg via ORAL
  Filled 2015-11-21 (×2): qty 1

## 2015-11-21 MED ORDER — ALPRAZOLAM 0.5 MG PO TABS: 0.5000 mg | ORAL_TABLET | Freq: Every day | ORAL | Status: DC | PRN

## 2015-11-21 MED ORDER — ACETAMINOPHEN 325 MG PO TABS
325.0000 mg | ORAL_TABLET | Freq: Four times a day (QID) | ORAL | Status: DC | PRN
  Administered 2015-11-23: 325 mg via ORAL
  Filled 2015-11-21: qty 1

## 2015-11-21 MED ORDER — HYDRALAZINE HCL 20 MG/ML IJ SOLN: 5.0000 mg | INTRAMUSCULAR | Status: DC | PRN

## 2015-11-21 MED ORDER — HYDRALAZINE HCL 50 MG PO TABS
75.0000 mg | ORAL_TABLET | Freq: Three times a day (TID) | ORAL | Status: DC
  Administered 2015-11-22 – 2015-11-23 (×5): 75 mg via ORAL
  Filled 2015-11-21 (×5): qty 1

## 2015-11-21 MED ORDER — METOPROLOL TARTRATE 5 MG/5ML IV SOLN: 2.0000 mg | INTRAVENOUS | Status: DC | PRN

## 2015-11-21 MED ORDER — PANTOPRAZOLE SODIUM 40 MG PO TBEC
40.0000 mg | DELAYED_RELEASE_TABLET | Freq: Every day | ORAL | Status: DC
  Administered 2015-11-22 – 2015-11-23 (×2): 40 mg via ORAL
  Filled 2015-11-21 (×2): qty 1

## 2015-11-21 MED ORDER — ATORVASTATIN CALCIUM 10 MG PO TABS
10.0000 mg | ORAL_TABLET | Freq: Every day | ORAL | Status: DC
Start: 2015-11-22 — End: 2015-11-23
  Administered 2015-11-22 – 2015-11-23 (×2): 10 mg via ORAL
  Filled 2015-11-21 (×2): qty 1

## 2015-11-21 MED ORDER — POTASSIUM CHLORIDE CRYS ER 20 MEQ PO TBCR: 20.0000 meq | EXTENDED_RELEASE_TABLET | Freq: Once | ORAL | Status: DC

## 2015-11-21 MED ORDER — ACETAMINOPHEN 650 MG RE SUPP
650.0000 mg | Freq: Four times a day (QID) | RECTAL | Status: DC | PRN
  Administered 2015-11-21 – 2015-11-22 (×2): 650 mg via RECTAL
  Filled 2015-11-21 (×2): qty 1

## 2015-11-21 MED ORDER — BISACODYL 5 MG PO TBEC: 10.0000 mg | DELAYED_RELEASE_TABLET | Freq: Every day | ORAL | Status: DC | PRN

## 2015-11-21 MED ORDER — LABETALOL HCL 5 MG/ML IV SOLN: 10.0000 mg | INTRAVENOUS | Status: DC | PRN

## 2015-11-21 MED ORDER — KCL IN DEXTROSE-NACL 20-5-0.45 MEQ/L-%-% IV SOLN
INTRAVENOUS | Status: DC
  Administered 2015-11-21: 21:00:00 via INTRAVENOUS
  Filled 2015-11-21 (×2): qty 1000

## 2015-11-21 NOTE — Consult Note (Signed)
Requesting Physician: Dr.  Canary Brim       Reason for consultation:  To  evaluate for acute stroke  HPI:                                                                                                                                         [name] is an 82 y.o. female patient who  was brought into the emergency room for evaluation of acute stroke. She had some slurred speech and word finding difficulty.  Patient has a known critical left ICA stenosis, status post left carotid endarterectomy on 11/13/2015. She has been having uneventful postoperative period. Earlier today she was noted to have some difficulty with finding the correct word since no noted to have some slurred speech per family. Denies any focal weakness or numbness in upper or lower extremities, no new vision problems.   Date last known well:  2017, April  Time last known well:  At   tPA Given: No: no focal deficits, post  Stroke Risk Factors - carotid stenosis, s/p left CEA 1 week ago  Past Medical History: Past Medical History  Diagnosis Date  . [place] DISEASE, LUMBAR 12/16/2008  . DIVERTICULOSIS, COLON 09/30/2008  . DYSPNEA 07/13/2008  . HYPERLIPIDEMIA 03/06/2007  . HYPERTENSION 03/06/2007  . HYPOTHYROIDISM 10/13/2007  . OSTEOARTHRITIS 03/06/2007  . Personal history of colonic polyps 09/30/2008  . TOBACCO USE, QUIT 04/12/2009  . WEAKNESS 11/09/2007  . Graves disease   . Aortic arch atherosclerosis ([place]) 06/24/2014  . Atherosclerotic ulcer of aorta ([place]) 06/24/2014  . History of embolic stroke [id number]    Left brain  . Stroke ([place])     06/2014           . Anxiety     Past Surgical History  Procedure Laterality Date  . Cataract extraction    . Knee surgery    . Cholecystectomy    . Cardiac catheterization    . Tee without cardioversion N/A 06/24/2014    Procedure: TRANSESOPHAGEAL ECHOCARDIOGRAM (TEE);  Surgeon: Sanda Klein, MD;  Location: [facility] ENDOSCOPY;  Service: Cardiovascular;  Laterality: N/A;  . Endarterectomy  Left 11/13/2015    Procedure: LEFT CAROTID ARTERY ENDARTERECTOMY;  Surgeon: Conrad Hospers, MD;  Location: [facility];  Service: Vascular;  Laterality: Left;  . Patch angioplasty Left 11/13/2015    Procedure: WITH 1CM X 6CM  XENOSURE BIOLOGIC PATCH ANGIOPLASTY;  Surgeon: Conrad Destrehan, MD;  Location: [facility] OR;  Service: Vascular;  Laterality: Left;    Family History: Family History  Problem Relation Age of Onset  . Cancer Maternal Grandmother     stomach    Social History:   reports that she quit smoking about 37 years ago. She has never used smokeless tobacco. She reports that she drinks about 4.2 oz of alcohol per week. She reports that she does not use illicit drugs.  Allergies:  Allergies  Allergen Reactions  . Catapres [Clonidine Hcl] Other (See Comments)    Made pt feel horrible, shaky, weak and nausea  . Lisinopril Anaphylaxis    Tongue swelling  . Labetalol Hcl Other (See Comments)    Caused bradycardia and syncope     Medications:                                                                                                                        No current facility-administered medications for this encounter.  Current outpatient prescriptions:  .  acetaminophen (TYLENOL) 325 MG tablet, Take 1 tablet (325 mg total) by mouth every 6 (six) hours as needed., Disp: , Rfl:  .  ALPRAZolam (XANAX) 0.5 MG tablet, Take 1 tablet (0.5 mg total) by mouth daily as needed for anxiety., Disp: 30 tablet, Rfl: 1 .  amLODipine (NORVASC) 10 MG tablet, Take 1 tablet (10 mg total) by mouth daily., Disp: 30 tablet, Rfl: 11 .  atorvastatin (LIPITOR) 80 MG tablet, TAKE 1 TABLET BY MOUTH EVERY DAY AT 6PM, Disp: 90 tablet, Rfl: 2 .  bisacodyl (DULCOLAX) 5 MG EC tablet, Take 10 mg by mouth daily as needed for moderate constipation., Disp: , Rfl:  .  clopidogrel (PLAVIX) 75 MG tablet, Take 1 tablet (75 mg total) by mouth daily., Disp: 30 tablet, Rfl: 0 .  docusate sodium (COLACE) 100 MG capsule, Take 100 mg by  mouth 2 (two) times daily., Disp: , Rfl:  .  hydrALAZINE (APRESOLINE) 50 MG tablet, Take 1.5 tablets (75 mg total) by mouth 3 (three) times daily., Disp: 135 tablet, Rfl: 3 .  levothyroxine (SYNTHROID, LEVOTHROID) 75 MCG tablet, TAKE 1 TABLET BY MOUTH EVERY DAY, Disp: 90 tablet, Rfl: 1   ROS:                                                                                                                                       History obtained from the patient  General ROS: negative for - chills, fatigue, fever, night sweats, weight gain or weight loss Psychological ROS: negative for - behavioral disorder, hallucinations, memory difficulties, mood swings or suicidal ideation Ophthalmic ROS: negative for - blurry vision, double vision, eye pain or loss of vision ENT ROS: negative for - epistaxis, nasal discharge, oral lesions, sore throat, tinnitus or vertigo Allergy and Immunology ROS: negative for - hives or itchy/watery eyes Hematological and Lymphatic ROS:  negative for - bleeding problems, bruising or swollen lymph nodes Endocrine ROS: negative for - galactorrhea, hair pattern changes, polydipsia/polyuria or temperature intolerance Respiratory ROS: negative for - cough, hemoptysis, shortness of breath or wheezing Cardiovascular ROS: negative for - chest pain, dyspnea on exertion, edema or irregular heartbeat Gastrointestinal ROS: negative for - abdominal pain, diarrhea, hematemesis, nausea/vomiting or stool incontinence Genito-Urinary ROS: negative for - dysuria, hematuria, incontinence or urinary frequency/urgency Musculoskeletal ROS: negative for - joint swelling or muscular weakness Neurological ROS: as noted in HPI Dermatological ROS: negative for rash and skin lesion changes  Neurologic Examination:                                                                                                    Today's Vitals   11/21/15 1545 11/21/15 1600 11/21/15 1615 11/21/15 1642  BP: 149/67  168/67 167/71   Pulse: 87 92 89   Temp:      Resp: 18 16 17    Height:      Weight:      SpO2: 95% 96% 96%   PainSc:    Asleep    Evaluation of higher integrative functions including: Level of alertness: Alert,  Oriented to time, place and person Speech: fluent, no evidence of dysarthria or aphasia noted.  Test the following cranial nerves: 2-12 grossly intact Motor examination:  full 5/5 motor strength in all 4 extremities Examination of sensation : Normal and symmetric sensation to pinprick in all 4 extremities and on face Examination of deep tendon reflexes: 2+, normal and symmetric in all extremities, normal plantars bilaterally Test coordination: Normal finger nose testing, with no evidence of limb appendicular ataxia or abnormal involuntary movements or tremors noted.  Gait: Deferred     Lab Results: Basic Metabolic Panel:  Recent Labs Lab 11/16/15 0431 11/21/15 1158  NA 134* 133*  K 3.4* 3.5  CL 95* 97*  CO2 29 27  GLUCOSE 119* 138*  BUN 10 11  CREATININE 1.04* 1.11*  CALCIUM 8.8* 8.9    Liver Function Tests:  Recent Labs Lab 11/21/15 1158  AST 36  ALT 76*  ALKPHOS 142*  BILITOT 0.7  PROT 6.6  ALBUMIN 3.4*   No results for input(s): LIPASE, AMYLASE in the last 168 hours. No results for input(s): AMMONIA in the last 168 hours.  CBC:  Recent Labs Lab 11/16/15 0431 11/21/15 1158  WBC 6.9 8.5  NEUTROABS  --  6.1  HGB 9.6* 11.9*  HCT 29.7* 36.4  MCV 88.7 89.0  PLT 197 261    Cardiac Enzymes: No results for input(s): CKTOTAL, CKMB, CKMBINDEX, TROPONINI in the last 168 hours.  Lipid Panel: No results for input(s): CHOL, TRIG, HDL, CHOLHDL, VLDL, LDLCALC in the last 168 hours.  CBG:  Recent Labs Lab 11/21/15 1148  GLUCAP 137*    Microbiology: Results for orders placed or performed during the hospital encounter of 11/06/15  Surgical pcr screen     Status: None   Collection Time: 11/06/15  3:10 PM  Result Value Ref Range Status    MRSA, PCR NEGATIVE NEGATIVE Final  Staphylococcus aureus NEGATIVE NEGATIVE Final    Comment:        The Xpert SA Assay (FDA approved for NASAL specimens in patients over 75 years of age), is one component of a comprehensive surveillance program.  Test performance has been validated by [facility] for patients greater than or equal to 72 year old. It is not intended to diagnose infection nor to guide or monitor treatment.      Imaging: Ct Angio Head W/cm &/or Wo Cm  11/21/2015  CLINICAL DATA:  Slurred speech, expressive aphasia, facial droop, unable to use RIGHT hand for 2 days. Recent carotid endarterectomy. EXAM: CT ANGIOGRAPHY HEAD AND NECK TECHNIQUE: Multidetector CT imaging of the head and neck was performed using the standard protocol during bolus administration of intravenous contrast. Multiplanar CT image reconstructions and MIPs were obtained to evaluate the vascular anatomy. Carotid stenosis measurements (when applicable) are obtained utilizing NASCET criteria, using the distal internal carotid diameter as the denominator. CONTRAST:  Isovue 370, 50 mL. COMPARISON:  CTA neck 10/09/2015.  CT head 11/21/2015. FINDINGS: CT HEAD Calvarium and skull base: No fracture or destructive lesion. Mastoids and middle ears are grossly clear. Paranasal sinuses: Imaged portions are clear. Orbits: Negative. Brain: No evidence of acute abnormality, including acute infarct, hemorrhage, hydrocephalus, or mass lesion. CTA NECK Aortic arch: Standard branching. Imaged portion shows no evidence of aneurysm or dissection. No significant stenosis of the major arch vessel origins. Extensive atheromatous plaque in the aortic arch and descending thoracic aorta resulting in luminal narrowing. No visible dissection. Right carotid system: 50% stenosis RIGHT ICA origin. Moderately calcified plaque. No evidence of dissection, flow-limiting stenosis, or occlusion. Left carotid system: Widely patent status post  endarterectomy. No complicating features. No evidence of dissection, stenosis (50% or greater) or occlusion. Vertebral arteries: RIGHT dominant. Both patent. Calcific plaque at both origins not flow reducing. No evidence of dissection, stenosis (50% or greater) or occlusion. Nonvascular soft tissues: COPD. Advanced facet arthropathy. Moderate disc degeneration. Heterogeneous thyroid. RIGHT lower pole thyroid nodule again noted measuring 11 mm CTA HEAD Anterior circulation: Dolichoectatic and asymmetrically enlarged LEFT cervical ICA. Atherosclerotic type aneurysm involving the LEFT cavernous and supraclinoid ICA measuring 10 x 10 x 13 mm. No associated thrombus or luminal narrowing. Mild cavernous vascular calcification on the RIGHT. No M1 stenosis. No MCA branch occlusion. Posterior circulation: RIGHT vertebral dominant. Basilar artery widely patent. Dolichoectasia RIGHT vertebral No significant stenosis, proximal occlusion, aneurysm, or vascular malformation. Venous sinuses: As permitted by contrast timing, patent. Anatomic variants: None of significance. Delayed phase:   No abnormal intracranial enhancement. IMPRESSION: No visible complication related to recent LEFT carotid endarterectomy. No flow reducing lesion is evident. Endarterectomy is widely patent. Atherosclerotic type aneurysmal enlargement in the cavernous and supraclinoid ICA on the LEFT, 10 x 10 x 13 mm, without internal thrombus or narrowing of the parent vessel. No acute intracranial findings are evident. However, MRI is more sensitive in the detection of acute infarction. Electronically Signed   By: Staci Righter M.D.   On: 11/21/2015 18:03   Ct Head Wo Contrast  11/21/2015  CLINICAL DATA:  Aphasia, history of recent left endarterectomy. EXAM: CT HEAD WITHOUT CONTRAST TECHNIQUE: Contiguous axial images were obtained from the base of the skull through the vertex without intravenous contrast. COMPARISON:  10/09/2015 FINDINGS: Bony calvarium is  intact. No gross soft tissue abnormality is noted. No findings to suggest acute hemorrhage, acute infarction or space-occupying mass lesion are noted. IMPRESSION: No acute abnormality noted. Electronically Signed  By: Inez Catalina M.D.   On: 11/21/2015 12:54   Ct Angio Neck W/cm &/or Wo/cm  11/21/2015  CLINICAL DATA:  Slurred speech, expressive aphasia, facial droop, unable to use RIGHT hand for 2 days. Recent carotid endarterectomy. EXAM: CT ANGIOGRAPHY HEAD AND NECK TECHNIQUE: Multidetector CT imaging of the head and neck was performed using the standard protocol during bolus administration of intravenous contrast. Multiplanar CT image reconstructions and MIPs were obtained to evaluate the vascular anatomy. Carotid stenosis measurements (when applicable) are obtained utilizing NASCET criteria, using the distal internal carotid diameter as the denominator. CONTRAST:  Isovue 370, 50 mL. COMPARISON:  CTA neck 10/09/2015.  CT head 11/21/2015. FINDINGS: CT HEAD Calvarium and skull base: No fracture or destructive lesion. Mastoids and middle ears are grossly clear. Paranasal sinuses: Imaged portions are clear. Orbits: Negative. Brain: No evidence of acute abnormality, including acute infarct, hemorrhage, hydrocephalus, or mass lesion. CTA NECK Aortic arch: Standard branching. Imaged portion shows no evidence of aneurysm or dissection. No significant stenosis of the major arch vessel origins. Extensive atheromatous plaque in the aortic arch and descending thoracic aorta resulting in luminal narrowing. No visible dissection. Right carotid system: 50% stenosis RIGHT ICA origin. Moderately calcified plaque. No evidence of dissection, flow-limiting stenosis, or occlusion. Left carotid system: Widely patent status post endarterectomy. No complicating features. No evidence of dissection, stenosis (50% or greater) or occlusion. Vertebral arteries: RIGHT dominant. Both patent. Calcific plaque at both origins not flow  reducing. No evidence of dissection, stenosis (50% or greater) or occlusion. Nonvascular soft tissues: COPD. Advanced facet arthropathy. Moderate disc degeneration. Heterogeneous thyroid. RIGHT lower pole thyroid nodule again noted measuring 11 mm CTA HEAD Anterior circulation: Dolichoectatic and asymmetrically enlarged LEFT cervical ICA. Atherosclerotic type aneurysm involving the LEFT cavernous and supraclinoid ICA measuring 10 x 10 x 13 mm. No associated thrombus or luminal narrowing. Mild cavernous vascular calcification on the RIGHT. No M1 stenosis. No MCA branch occlusion. Posterior circulation: RIGHT vertebral dominant. Basilar artery widely patent. Dolichoectasia RIGHT vertebral No significant stenosis, proximal occlusion, aneurysm, or vascular malformation. Venous sinuses: As permitted by contrast timing, patent. Anatomic variants: None of significance. Delayed phase:   No abnormal intracranial enhancement. IMPRESSION: No visible complication related to recent LEFT carotid endarterectomy. No flow reducing lesion is evident. Endarterectomy is widely patent. Atherosclerotic type aneurysmal enlargement in the cavernous and supraclinoid ICA on the LEFT, 10 x 10 x 13 mm, without internal thrombus or narrowing of the parent vessel. No acute intracranial findings are evident. However, MRI is more sensitive in the detection of acute infarction. Electronically Signed   By: Staci Righter M.D.   On: 11/21/2015 18:03     Stroke Scales   NIHSS :  0   Assessment and plan:   [name] is an 82 y.o. female patient who presented with a transient symptoms of forearm word finding difficulty and slurred speech as described above, nonfocal neurological examination during my evaluation. She does have a risk for stroke, secondary to carotid stenosis, with recent left carotid endarterectomy done on 11/13/2015. To rule out postop thrombosis of the carotid artery, recommend CT angiogram of the head and neck. We'll  also obtain an MRI of the brain for further evaluation of an acute infarct. She'll be admitted to the vascular surgery service for completion of the workup and monitoring.  Patient is on Plavix at this time, recommend continue the same.  At the time of finalizing this note, CTA of the head and neck was completed  which showed patent carotid arteries, no postoperative palpitations noted. Incidental left ICA aneurysm measuring 10 mm x 30 mm described in the report. Defer further evaluation of aneurysm to the vascular surgery team.  Neurology stroke team will continue to follow starting tomorrow morning.

## 2015-11-21 NOTE — Telephone Encounter (Signed)
[facility] PT, called to report new finding; this morning patient has a noticeable left facial droop and is having increasing numbness/weakness in her left leg and left arm since PT arrived. Patient is s/p Left CEA with 1 x 6 Xenosure biologic patch angioplasty on 11-13-15 and was discharged on 11-17-15 with none of the deficits noted.  I instructed Jeanine to tell family to dial 911 and get to [facility] ED asap for possible code stroke. She voiced understanding and agreement of this plan.  Evidently, Dr. Burnice Logan ordered PT for this lady 3 x week for 2 weeks starting today (per Dr. Truddie Hidden note from this morning).

## 2015-11-21 NOTE — Telephone Encounter (Signed)
Spoke to [place], told her pt needs to Dentist and let them know what is going on and see if they want to see her. Jeanine verbalized understanding and said pt has an appt with Dr.K tomorrow. Told her okay but needs to contact surgeon. Jeanine verbalized understanding.

## 2015-11-21 NOTE — ED Notes (Signed)
Pt also reports dysphagia for 3 days.

## 2015-11-21 NOTE — Telephone Encounter (Signed)
Jeannie called from [facility] to request verbal orders for PT three times a week for 2 weeks    [phone]

## 2015-11-21 NOTE — ED Notes (Signed)
Attempted report 

## 2015-11-21 NOTE — Progress Notes (Signed)
VASCULAR SURGERY:  CT ANGIOGRAM NECK: this shows that the left carotid endarterectomy site is widely patent.  CT ANGIOGRAM HEAD: This shows no acute stroke. An incidental finding is "Atherosclerotic type aneurysm involving the LEFT cavernous and supraclinoid ICA measuring 10 x 10 x 13 mm."  MRI BRAIN: This shows no evidence of a stroke.  Appreciate neurology's help.  We'll keep overnight for observation.  Deitra Mayo, MD, [place] [phone] Office: [phone]

## 2015-11-21 NOTE — Consult Note (Signed)
Vascular and Vein Specialist of [facility]  Patient name: [name] MRN: [id number] DOB: Nov 30, 1924 Sex: female  REASON FOR CONSULT: Mild expressive aphasia and mild right upper extremity weakness status post left carotid endarterectomy. The consult is from the emergency department.  HPI: [name] is a 82 y.o. female, who underwent a left carotid endarterectomy with bovine pericardial patch angioplasty by Dr. Adele Barthel on 11/13/2015. This was done for a symptomatic greater than 80% left carotid stenosis. This patient had a left parietal stroke in December 2015. The patient had a high-grade greater than 80% left carotid stenosis. The patient has a history of a TIA with right hand weakness and paresthesias. The patient underwent preoperative cardiac clearance prior to her surgery. Of note, preoperative CTA of the neck showed advanced atherosclerotic plaque of the thoracic and distal aortic arch. There was a 50% stenosis of the proximal right internal carotid artery. It was a critical left carotid stenosis. The patient was not a candidate for carotid stenting due to the severity of the ICA disease and aortic arch disease.  The patient had some nausea and vomiting postoperatively and was in the hospital for about 4 days. She was discharged on Friday. She noted the following day, Saturday, that she was having some mild expressive aphasia. This persisted. This was worse today and she also noted some clumsiness in her right upper extremity when she was doing her crossword possible. She denies any sudden onset of weakness or paresthesias in either upper extremity or lower extremities.  Past Medical History  Diagnosis Date  . [place] DISEASE, LUMBAR 12/16/2008  . DIVERTICULOSIS, COLON 09/30/2008  . DYSPNEA 07/13/2008  . HYPERLIPIDEMIA 03/06/2007  . HYPERTENSION 03/06/2007  . HYPOTHYROIDISM 10/13/2007  . OSTEOARTHRITIS 03/06/2007  . Personal history of colonic polyps 09/30/2008  . TOBACCO USE, QUIT  04/12/2009  . WEAKNESS 11/09/2007  . Graves disease   . Aortic arch atherosclerosis ([place]) 06/24/2014  . Atherosclerotic ulcer of aorta ([place]) 06/24/2014  . History of embolic stroke [id number]    Left brain  . Stroke ([place])     06/2014           . Anxiety     Family History  Problem Relation Age of Onset  . Cancer Maternal Grandmother     stomach    SOCIAL HISTORY: Social History   Social History  . Marital Status: Divorced    Spouse Name: N/A  . Number of Children: 4  . Years of Education: college   Occupational History  . Not on file.   Social History Main Topics  . Smoking status: Former Smoker    Quit date: 07/08/1978  . Smokeless tobacco: Never Used  . Alcohol Use: 4.2 oz/week    7 Glasses of wine per week     Comment: "red wine"  . Drug Use: No  . Sexual Activity: Not on file   Other Topics Concern  . Not on file   Social History Narrative   Patient is right handed.   Patient drinks 1 cup caffeine daily.    Allergies  Allergen Reactions  . Catapres [Clonidine Hcl] Other (See Comments)    Made pt feel horrible, shaky, weak and nausea  . Lisinopril Anaphylaxis    Tongue swelling  . Labetalol Hcl Other (See Comments)    Caused bradycardia and syncope    No current facility-administered medications for this encounter.   Current Outpatient Prescriptions  Medication Sig Dispense Refill  . acetaminophen (TYLENOL) 325 MG  tablet Take 1 tablet (325 mg total) by mouth every 6 (six) hours as needed.    . ALPRAZolam (XANAX) 0.5 MG tablet Take 1 tablet (0.5 mg total) by mouth daily as needed for anxiety. 30 tablet 1  . amLODipine (NORVASC) 10 MG tablet Take 1 tablet (10 mg total) by mouth daily. 30 tablet 11  . atorvastatin (LIPITOR) 80 MG tablet TAKE 1 TABLET BY MOUTH EVERY DAY AT 6PM 90 tablet 2  . bisacodyl (DULCOLAX) 5 MG EC tablet Take 10 mg by mouth daily as needed for moderate constipation.    . clopidogrel (PLAVIX) 75 MG tablet Take 1 tablet (75 mg total)  by mouth daily. 30 tablet 0  . docusate sodium (COLACE) 100 MG capsule Take 100 mg by mouth 2 (two) times daily.    . hydrALAZINE (APRESOLINE) 50 MG tablet Take 1.5 tablets (75 mg total) by mouth 3 (three) times daily. 135 tablet 3  . levothyroxine (SYNTHROID, LEVOTHROID) 75 MCG tablet TAKE 1 TABLET BY MOUTH EVERY DAY 90 tablet 1    REVIEW OF SYSTEMS:  [X]  denotes positive finding, [ ]  denotes negative finding Cardiac  Comments:  Chest pain or chest pressure:    Shortness of breath upon exertion: X   Short of breath when lying flat:    Irregular heart rhythm:        Vascular    Pain in calf, thigh, or hip brought on by ambulation:    Pain in feet at night that wakes you up from your sleep:     Blood clot in your veins:    Leg swelling:         Pulmonary    Oxygen at home:    Productive cough:     Wheezing:         Neurologic    Sudden weakness in arms or legs:     Sudden numbness in arms or legs:     Sudden onset of difficulty speaking or slurred speech: X mild  Temporary loss of vision in one eye:     Problems with dizziness:         Gastrointestinal    Blood in stool:     Vomited blood:         Genitourinary    Burning when urinating:     Blood in urine:        Psychiatric    Major depression:         Hematologic    Bleeding problems:    Problems with blood clotting too easily:        Skin    Rashes or ulcers:        Constitutional    Fever or chills:      PHYSICAL EXAM: Filed Vitals:   11/21/15 1400 11/21/15 1415 11/21/15 1430 11/21/15 1445  BP: 160/77 144/69 150/61 167/65  Pulse: 81 81 79 77  Temp:      Resp: 19 18 16 16   Height:      Weight:      SpO2: 96% 95% 96% 95%    GENERAL: The patient is a well-nourished female, in no acute distress. The vital signs are documented above. CARDIAC: There is a regular rate and rhythm.  VASCULAR: I do not detect carotid bruits. PULMONARY: There is good air exchange bilaterally without wheezing or  rales. ABDOMEN: Soft and non-tender with normal pitched bowel sounds.  MUSCULOSKELETAL: There are no major deformities or cyanosis. NEUROLOGIC: She has mild expressive aphasia. I do  not appreciate any upper extremity or lower extremity weakness or paresthesias. SKIN: There are no ulcers or rashes noted. PSYCHIATRIC: The patient has a normal affect.  DATA:   CT THE HEAD: CT the head is unremarkable with no evidence of stroke or intracranial bleed.  MEDICAL ISSUES:  MILD EXPRESSIVE APHASIA AND SOME MILD CLUMSINESS RIGHT HAND STATUS POST LEFT CAROTID ENDARTERECTOMY: Exam is fairly unimpressive. I do not detect any focal weakness or paresthesias. I appreciate neurology consult and they have recommended a CT angiogram of the head and neck and also MRI. A duplex of the carotids is also been ordered. I will admit her to our service and notify Dr. Adele Barthel of her admission in the morning. Will continue her home medications.   Deitra Mayo [facility] [place]: [phone]

## 2015-11-21 NOTE — Telephone Encounter (Signed)
Okay for physical therapy.

## 2015-11-21 NOTE — Telephone Encounter (Signed)
Spoke to [place], given verbal orders for PT three times a week for 2 weeks for pt okay per Dr.K. Ashby Dawes verbalized understanding.

## 2015-11-21 NOTE — ED Provider Notes (Signed)
CSN: [id number]     Arrival date & time 11/21/15  1147 History   First MD Initiated Contact with Patient 11/21/15 1204     Chief Complaint  Patient presents with  . Aphasia     (Consider location/radiation/quality/duration/timing/severity/associated sxs/prior Treatment) HPI  Pt presenting with c/o slurred speech, word finding difficulties and difficulty writing with right hand.  Symptoms started 2 days ago- did not resolve- she is one week s/o left carotid endarterectomy- PT saw her today and was concerned about her symptoms. Called vascular office and was advised to call 911.  No headache, no significant neck pain.  No changes in vision.  No focal weakness.  No fever/chills.  There are no other associated systemic symptoms, there are no other alleviating or modifying factors.   Past Medical History  Diagnosis Date  . [place] DISEASE, LUMBAR 12/16/2008  . DIVERTICULOSIS, COLON 09/30/2008  . DYSPNEA 07/13/2008  . HYPERLIPIDEMIA 03/06/2007  . HYPERTENSION 03/06/2007  . HYPOTHYROIDISM 10/13/2007  . OSTEOARTHRITIS 03/06/2007  . Personal history of colonic polyps 09/30/2008  . TOBACCO USE, QUIT 04/12/2009  . WEAKNESS 11/09/2007  . Graves disease   . Aortic arch atherosclerosis ([place]) 06/24/2014  . Atherosclerotic ulcer of aorta ([place]) 06/24/2014  . History of embolic stroke [id number]    Left brain  . Stroke ([place])     06/2014           . Anxiety    Past Surgical History  Procedure Laterality Date  . Cataract extraction    . Knee surgery    . Cholecystectomy    . Cardiac catheterization    . Tee without cardioversion N/A 06/24/2014    Procedure: TRANSESOPHAGEAL ECHOCARDIOGRAM (TEE);  Surgeon: Sanda Klein, MD;  Location[facility] ENDOSCOPY;  Service: Cardiovascular;  Laterality: N/A;  . Endarterectomy Left 11/13/2015    Procedure: LEFT CAROTID ARTERY ENDARTERECTOMY;  Surgeon: Conrad Rogers, MD;  Location: [place];  Service: Vascular;  Laterality: Left;  . Patch angioplasty Left 11/13/2015    Procedure: WITH  1CM X 6CM  XENOSURE BIOLOGIC PATCH ANGIOPLASTY;  Surgeon: Conrad Golva, MD;  Location: [facility] OR;  Service: Vascular;  Laterality: Left;   Family History  Problem Relation Age of Onset  . Cancer Maternal Grandmother     stomach   Social History  Substance Use Topics  . Smoking status: Former Smoker    Quit date: 07/08/1978  . Smokeless tobacco: Never Used  . Alcohol Use: 4.2 oz/week    7 Glasses of wine per week     Comment: "red wine"   OB History    No data available     Review of Systems  ROS reviewed and all otherwise negative except for mentioned in HPI    Allergies  Catapres; Lisinopril; and Labetalol hcl  Home Medications   Prior to Admission medications   Medication Sig Start Date End Date Taking? Authorizing Provider  acetaminophen (TYLENOL) 325 MG tablet Take 1 tablet (325 mg total) by mouth every 6 (six) hours as needed. 11/17/15  Yes Samantha J Rhyne, PA-C  ALPRAZolam (XANAX) 0.5 MG tablet Take 1 tablet (0.5 mg total) by mouth daily as needed for anxiety. 10/09/15  Yes Marletta Lor, MD  amLODipine (NORVASC) 10 MG tablet Take 1 tablet (10 mg total) by mouth daily. 11/17/15  Yes Samantha J Rhyne, PA-C  atorvastatin (LIPITOR) 80 MG tablet TAKE 1 TABLET BY MOUTH EVERY DAY AT 6PM 11/10/15  Yes Marletta Lor, MD  bisacodyl (DULCOLAX) 5 MG EC  tablet Take 10 mg by mouth daily as needed for moderate constipation.   Yes Historical Provider, MD  clopidogrel (PLAVIX) 75 MG tablet Take 1 tablet (75 mg total) by mouth daily. 11/17/15  Yes Samantha J Rhyne, PA-C  docusate sodium (COLACE) 100 MG capsule Take 100 mg by mouth 2 (two) times daily.   Yes Historical Provider, MD  hydrALAZINE (APRESOLINE) 50 MG tablet Take 1.5 tablets (75 mg total) by mouth 3 (three) times daily. 11/17/15  Yes Clayborne Dana, MD  levothyroxine (SYNTHROID, LEVOTHROID) 75 MCG tablet TAKE 1 TABLET BY MOUTH EVERY DAY 10/27/15  Yes Marletta Lor, MD   BP 167/71 mmHg  Pulse 89  Temp(Src) 97.8 F  (36.6 C)  Resp 17  Ht 5\' 6"  (1.676 m)  Wt 68.04 kg  BMI 24.22 kg/m2  SpO2 96%  Vitals reviewed Physical Exam  Physical Examination: General appearance - alert, well appearing, and in no distress Mental status - alert, oriented to person, place, and time Eyes - pupils equal and reactive, extraocular eye movements intact Mouth - mucous membranes moist, pharynx normal without lesions Chest - clear to auscultation, no wheezes, rales or rhonchi, symmetric air entry Heart - normal rate, regular rhythm, normal S1, S2, no murmurs, rubs, clicks or gallops Abdomen - soft, nontender, nondistended, no masses or organomegaly Neurological - alert, oriented x 3, mild slurring of speech, left sided facial droop, other cranial nerves tested and intact, strength 5/5 in extremities x 4, sensation intact Extremities - peripheral pulses normal, no pedal edema, no clubbing or cyanosis Skin - normal coloration and turgor, no rashes  ED Course  Procedures (including critical care time) Labs Review Labs Reviewed  CBC - Abnormal; Notable for the following:    Hemoglobin 11.9 (*)    All other components within normal limits  COMPREHENSIVE METABOLIC PANEL - Abnormal; Notable for the following:    Sodium 133 (*)    Chloride 97 (*)    Glucose, Bld 138 (*)    Creatinine, Ser 1.11 (*)    Albumin 3.4 (*)    ALT 76 (*)    Alkaline Phosphatase 142 (*)    GFR calc non Af Amer 43 (*)    GFR calc Af Amer 50 (*)    All other components within normal limits  CBG MONITORING, ED - Abnormal; Notable for the following:    Glucose-Capillary 137 (*)    All other components within normal limits  PROTIME-INR  APTT  DIFFERENTIAL  URINALYSIS, ROUTINE W REFLEX MICROSCOPIC (NOT AT [facility])  Randolm Idol, ED    Imaging Review Ct Head Wo Contrast  11/21/2015  CLINICAL DATA:  Aphasia, history of recent left endarterectomy. EXAM: CT HEAD WITHOUT CONTRAST TECHNIQUE: Contiguous axial images were obtained from the base of  the skull through the vertex without intravenous contrast. COMPARISON:  10/09/2015 FINDINGS: Bony calvarium is intact. No gross soft tissue abnormality is noted. No findings to suggest acute hemorrhage, acute infarction or space-occupying mass lesion are noted. IMPRESSION: No acute abnormality noted. Electronically Signed   By: Inez Catalina M.D.   On: 11/21/2015 12:54   I have personally reviewed and evaluated these images and lab results as part of my medical decision-making.   EKG Interpretation   Date/Time:  Tuesday Nov 21 2015 11:52:19 EDT Ventricular Rate:  84 PR Interval:  160 QRS Duration: 111 QT Interval:  396 QTC Calculation: 468 R Axis:   85 Text Interpretation:  Sinus rhythm Anterior infarct, old Minimal ST  elevation, inferior  leads Baseline wander in lead(s) I III aVL No  significant change since last tracing Confirmed by [facility]  MD, MARTHA  [phone]) on 11/21/2015 12:40:47 PM Also confirmed by Canary Brim  MD, MARTHA  [phone]), editor [place] CT, Leda Gauze [phone])  on 11/21/2015 1:12:02 PM      MDM   Final diagnoses:  Aphasia  Slurred speech    Pt presenting one week s/p left carotid endarterectomy with c/o slurred speech, expressive aphasia and difficulty writing with her right hand.  Head CT and initial TIA/stroke workup negative- d/w vascular and neurology as below.    3:36 PM d/w Dr. Doren Custard, vascular surgery, he recomends carotid duplex and will see patient in the ED.    4:25 PM d/w Dr. Doren Custard, he has talked with neurology and they request a CT angio head and neck, and MR brain which I have ordered.  Vascular is planning to admit patient.    Alfonzo Beers, MD 11/21/15 [phone]

## 2015-11-21 NOTE — Telephone Encounter (Signed)
Okay for PT for pt? 

## 2015-11-21 NOTE — ED Notes (Signed)
Family at bedside. 

## 2015-11-21 NOTE — ED Notes (Signed)
Pt placed on a bedpan.  

## 2015-11-21 NOTE — ED Notes (Addendum)
Pt here from home- home health nurse stated that yesterday she noticed slurred speech, expressive aphasia, trouble writing, and facial droop. Pt sts symptoms started 2 days ago, but she thought they would pass. Pt a/o x 4. No numbness or weakness.Pt had recent carotid endarterectomy. Pt also reports no BM since 5/8.

## 2015-11-21 NOTE — Telephone Encounter (Signed)
Home Health provider stated patient states she is having difficulty with speech and writing, more so than previously.  Patient recently had a carotid artery procedure.  Home health stated patient has a noticeable facial droop that family states they didn't notice her having before.  Home health asks to be advised on next step of treatment.

## 2015-11-22 ENCOUNTER — Ambulatory Visit: Payer: Medicare Other | Admitting: Internal Medicine

## 2015-11-22 DIAGNOSIS — I6522 Occlusion and stenosis of left carotid artery: Secondary | ICD-10-CM

## 2015-11-22 DIAGNOSIS — Z9889 Other specified postprocedural states: Secondary | ICD-10-CM | POA: Diagnosis not present

## 2015-11-22 DIAGNOSIS — I700 Atherosclerosis of aorta: Secondary | ICD-10-CM | POA: Diagnosis not present

## 2015-11-22 DIAGNOSIS — E785 Hyperlipidemia, unspecified: Secondary | ICD-10-CM

## 2015-11-22 DIAGNOSIS — I10 Essential (primary) hypertension: Secondary | ICD-10-CM

## 2015-11-22 DIAGNOSIS — G451 Carotid artery syndrome (hemispheric): Secondary | ICD-10-CM | POA: Diagnosis not present

## 2015-11-22 LAB — LIPID PANEL
Cholesterol: 137 mg/dL (ref 0–200)
HDL: 59 mg/dL (ref >40)
LDL CALC: 66 mg/dL (ref 0–99)
Total CHOL/HDL Ratio: 2.3 RATIO
Triglycerides: 59 mg/dL (ref <150)
VLDL: 12 mg/dL (ref 0–40)

## 2015-11-22 LAB — TSH: TSH: 2.764 u[IU]/mL (ref 0.350–4.500)

## 2015-11-22 LAB — VITAMIN B12: VITAMIN B 12: 335 pg/mL (ref 180–914)

## 2015-11-22 NOTE — Care Management Note (Signed)
Case Management Note  Patient Details  Name: [name] MRN: [id number] Date of Birth: 01/05/1925  Subjective/Objective:     Pt in with carotid stenosis. Pt underwent LCEA on 5/8. She is from home alone but was active with [facility].                Action/Plan: Continued medical work up. CM following for d/c needs.   Expected Discharge Date:                  Expected Discharge Plan:  [place]  In-House Referral:     Discharge planning Services     Post Acute Care Choice:    Choice offered to:     DME Arranged:    DME Agency:     HH Arranged:    [place] Agency:     Status of Service:  In process, will continue to follow  Medicare Important Message Given:    Date Medicare IM Given:    Medicare IM give by:    Date Additional Medicare IM Given:    Additional Medicare Important Message give by:     If discussed at [place] of Stay Meetings, dates discussed:    Additional Comments:  Pollie Friar, RN 11/22/2015, 10:55 AM

## 2015-11-22 NOTE — Progress Notes (Addendum)
   Daily Progress Note  Assessment/Planning: POD #9 s/p L CEA for sx L ICA stenosis >80%, reported aphasia, reported R weakness, mild marginal mandibular palsy resolving   No evidence of acute stroke.  No evidence of major complication from L CEA.  My concern would be embolization from aortic arch given the severity of atherosclerotic burden in her arch.  Given her age, she would NOT be a candidate for aortic arch replacement.  Neuro intact today  All of yesterday's studies negative   From a vascular surgery viewpoint, nothing more to add except maybe medical mgmt of aortic arch disease, i.e. considering ASA + Plavix.  Currently only on Plavix.    I would have concerns with starting anticoagulation in a 82 year old without clear evidence of embolization.    Will see what Neurology thinks   Subjective    No complaints  Objective Filed Vitals:   11/22/15 0030 11/22/15 0230 11/22/15 0430 11/22/15 0647  BP: 125/56 154/56 153/53 162/63  Pulse: 75 71 77 79  Temp: 99 F (37.2 C) 98.6 F (37 C) 98.6 F (37 C) 98.1 F (36.7 C)  TempSrc: Oral Oral Oral Oral  Resp: 18 16 16 16   Height:      Weight:      SpO2: 94% 95% 94% 96%   No intake or output data in the 24 hours ending 11/22/15 0835  PULM  CTAB CV  RRR GI  soft, NTND NECK L neck inc c/d/i  NEURO M/S 5/5, mild left angle of mouth droops  Laboratory CBC    Component Value Date/Time   WBC 8.5 11/21/2015 1158   HGB 11.9* 11/21/2015 1158   HCT 36.4 11/21/2015 1158   PLT 261 11/21/2015 1158    BMET    Component Value Date/Time   NA 133* 11/21/2015 1158   NA 138 09/13/2015 1103   K 3.5 11/21/2015 1158   CL 97* 11/21/2015 1158   CO2 27 11/21/2015 1158   GLUCOSE 138* 11/21/2015 1158   GLUCOSE 143* 09/13/2015 1103   BUN 11 11/21/2015 1158   BUN 23 09/13/2015 1103   CREATININE 1.11* 11/21/2015 1158   CALCIUM 8.9 11/21/2015 1158   GFRNONAA 43* 11/21/2015 1158   GFRAA 50* 11/21/2015 [place],  MD [facility] [place] Office: [phone] Pager: [phone]  11/22/2015, 8:35 AM

## 2015-11-22 NOTE — Progress Notes (Addendum)
STROKE TEAM PROGRESS NOTE   HISTORY OF PRESENT ILLNESS [name] is an 82 y.o. female patient who was brought into the emergency room for evaluation of acute stroke. She had some slurred speech and word finding difficulty. Patient has a known critical left ICA stenosis, status post left carotid endarterectomy on 11/13/2015. She has been having an uneventful postoperative period. Earlier today she was noted to have some difficulty with finding the correct word and noted to have some slurred speech per family. Denies any focal weakness or numbness in upper or lower extremities, no new vision problems. Patient was not administered IV t-PA secondary to no focal deficits. She was admitted for further evaluation and treatment.   SUBJECTIVE (INTERVAL HISTORY) Her daughter and granddaughter are at the bedside.  Overall she feels her condition is rapidly improving. Daughter stated that last Saturday and yesterday she both had episodes of word finding difficulties. Daughter still thinking her symptoms fluctuates from time to time today. She is on plavix at home. Had left CEA one week ago and image studies showed wide open left ICA. MRI negative for stroke so far.    OBJECTIVE Temp:  [97.7 F (36.5 C)-99 F (37.2 C)] 98.1 F (36.7 C) (05/17 1044) Pulse Rate:  [71-92] 80 (05/17 1044) Cardiac Rhythm:  [-] Normal sinus rhythm (05/17 0706) Resp:  [16-20] 16 (05/17 1044) BP: (125-169)/(53-78) 163/78 mmHg (05/17 1044) SpO2:  [93 %-97 %] 97 % (05/17 1044) Weight:  [67.1 kg (147 lb 14.9 oz)] 67.1 kg (147 lb 14.9 oz) (05/16 2049)  CBC:  Recent Labs Lab 11/16/15 0431 11/21/15 1158  WBC 6.9 8.5  NEUTROABS  --  6.1  HGB 9.6* 11.9*  HCT 29.7* 36.4  MCV 88.7 89.0  PLT 197 0000000    Basic Metabolic Panel:  Recent Labs Lab 11/16/15 0431 11/21/15 1158  NA 134* 133*  K 3.4* 3.5  CL 95* 97*  CO2 29 27  GLUCOSE 119* 138*  BUN 10 11  CREATININE 1.04* 1.11*  CALCIUM 8.8* 8.9    Lipid Panel:     Component Value Date/Time   CHOL 184 02/15/2015 1122   TRIG 109.0 02/15/2015 1122   HDL 66.70 02/15/2015 1122   CHOLHDL 3 02/15/2015 1122   VLDL 21.8 02/15/2015 1122   LDLCALC 96 02/15/2015 1122   HgbA1c:  Lab Results  Component Value Date   HGBA1C 6.3* 06/23/2014   Urine Drug Screen: No results found for: LABOPIA, COCAINSCRNUR, LABBENZ, AMPHETMU, THCU, LABBARB    IMAGING  Ct Head Wo Cotrast 11/21/2015   No acute abnormality noted.   Ct Angio Head & Neck W/cm &/or Wo/cm 11/21/2015   No visible complication related to recent LEFT carotid endarterectomy. No flow reducing lesion is evident. Endarterectomy is widely patent. Atherosclerotic type aneurysmal enlargement in the cavernous and supraclinoid ICA on the LEFT, 10 x 10 x 13 mm, without internal thrombus or narrowing of the parent vessel. No acute intracranial findings are evident. However, MRI is more sensitive in the detection of acute infarction.   Mr Brain Wo Contrast 11/21/2015   No acute stroke is evident.  Chronic changes as described.   TTE pending   PHYSICAL EXAM  Temp:  [97.7 F (36.5 C)-99 F (37.2 C)] 98.1 F (36.7 C) (05/17 1044) Pulse Rate:  [71-83] 80 (05/17 1044) Resp:  [16-19] 16 (05/17 1044) BP: (125-169)/(53-78) 163/78 mmHg (05/17 1044) SpO2:  [93 %-97 %] 97 % (05/17 1044) Weight:  [147 lb 14.9 oz (67.1 kg)] 147 lb  14.9 oz (67.1 kg) (05/16 2049)  General - Well nourished, well developed, in no apparent distress.  Ophthalmologic - Fundi not visualized due to eye movement.  Cardiovascular - Regular rate and rhythm.  Mental Status -  Level of arousal and orientation to time, place, and person were intact. Language including expression, naming, comprehension was assessed and found intact, but occasional paraphasic errors and difficulty repeating complex sentences.  Cranial Nerves II - XII - II - Visual field intact OU. III, IV, VI - Extraocular movements intact. V - Facial sensation intact  bilaterally. VII - Facial movement intact bilaterally. VIII - Hearing & vestibular intact bilaterally. X - Palate elevates symmetrically. XI - Chin turning & shoulder shrug intact bilaterally. XII - Tongue protrusion intact.  Motor Strength - The patient's strength was normal in all extremities and pronator drift was absent.  Bulk was normal and fasciculations were absent.   Motor Tone - Muscle tone was assessed at the neck and appendages and was normal.  Reflexes - The patient's reflexes were 1+ in all extremities and she had no pathological reflexes.  Sensory - Light touch, temperature/pinprick were assessed and were symmetrical.    Coordination - The patient had normal movements in the hands with no ataxia or dysmetria.  Tremor was absent.  Gait and Station - deferred due to safety concerns.   ASSESSMENT/PLAN [name] is a 82 y.o. female with history of L CEA 11/13/2015 presenting with expressive aphasia and slurred speech. Daughter also stated that she had similar episodes 4 days ago. She did not receive IV t-PA due to minimal deficits.   Left brain TIA, most likely due to severe and extensive soft plaques and atherosclerosis of aortic arch. Due to the severity of soft plaque at aortic arch and embolic feature of the episodes and failed plavix, we would recommend anticoagulation. However, coumadin may not be a good choice. Best choice would be DOACs like eliquis if prior authorization can be made. Will discuss with Dr. Bridgett Larsson.  MRI  no acute stroke  CTA Head & neck  L CEA open. No other significant stenosis. However, extensive aortic arch soft plaques similar to CTA in 09/2015  2D Echo  pending  LDL pending  HgbA1c pending  Lovenox 40 mg sq daily for VTE prophylaxis  Diet Heart Room service appropriate?: Yes; Fluid consistency:: Thin  clopidogrel 75 mg daily prior to admission, now on clopidogrel 75 mg daily. However, we recommend anticoagulation (see below)  Ongoing  aggressive stroke risk factor management  Therapy recommendations:  pending  Disposition:  Pending  Severe extensive atherosclerosis of aortic arch  Likely the embolic source to explain her episodes  Recommend anticoagulation  She is not good candidate for coumadin due to advanced age  She dose not like coumadin due to frequent INR monitoring and likely to be noncompliant  Best choice will be DOACs such as eliquis, but will need prior authorization as this is not FDA approved indication for eliquis. Will discuss with Dr. Bridgett Larsson.  Hypertension  Stable  Permissive hypertension (OK if <220/120) for 24-48 hours post stroke and then gradually normalized within 5-7 days.  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 Jannifer Franklin.  Will discuss with Dr. Bridgett Larsson.  Hospital day # 1  Rosalin Hawking, MD PhD Stroke Neurology 11/22/2015 4:59 PM   To contact Stroke Continuity provider, please refer to [email or web address] After hours, contact General Neurology

## 2015-11-22 NOTE — Evaluation (Signed)
Clinical/Bedside Swallow Evaluation Patient Details  Name: [name] MRN: [id number] Date of Birth: 05-29-1925  Today's Date: 11/22/2015 Time: SLP Start Time (ACUTE ONLY): 1001 SLP Stop Time (ACUTE ONLY): 1026 SLP Time Calculation (min) (ACUTE ONLY): 25 min  Past Medical History:  Past Medical History  Diagnosis Date  . [place] DISEASE, LUMBAR 12/16/2008  . DIVERTICULOSIS, COLON 09/30/2008  . DYSPNEA 07/13/2008  . HYPERLIPIDEMIA 03/06/2007  . HYPERTENSION 03/06/2007  . HYPOTHYROIDISM 10/13/2007  . OSTEOARTHRITIS 03/06/2007  . Personal history of colonic polyps 09/30/2008  . TOBACCO USE, QUIT 04/12/2009  . WEAKNESS 11/09/2007  . Graves disease   . Aortic arch atherosclerosis ([place]) 06/24/2014  . Atherosclerotic ulcer of aorta ([place]) 06/24/2014  . History of embolic stroke [id number]    Left brain  . Stroke ([place])     06/2014           . Anxiety    Past Surgical History:  Past Surgical History  Procedure Laterality Date  . Cataract extraction    . Knee surgery    . Cholecystectomy    . Cardiac catheterization    . Tee without cardioversion N/A 06/24/2014    Procedure: TRANSESOPHAGEAL ECHOCARDIOGRAM (TEE);  Surgeon: Sanda Klein, MD;  Location: [facility] ENDOSCOPY;  Service: Cardiovascular;  Laterality: N/A;  . Endarterectomy Left 11/13/2015    Procedure: LEFT CAROTID ARTERY ENDARTERECTOMY;  Surgeon: Conrad Punaluu, MD;  Location: [facility];  Service: Vascular;  Laterality: Left;  . Patch angioplasty Left 11/13/2015    Procedure: WITH 1CM X 6CM  XENOSURE BIOLOGIC PATCH ANGIOPLASTY;  Surgeon: Conrad Tuttle, MD;  Location: [facility];  Service: Vascular;  Laterality: Left;   HPI:  82 y.o. female underwent left CEA 11/13/15; admitted 5/16 with mild aphasia, RUE weakness, difficulty swallowing (clarified to mean "lump in the throat" per pt's description), mild marginal mandibular palsy. Hx TIA, left parietal CVA 2015.  MRI negative for acute stroke.    Assessment / Plan / Recommendation Clinical Impression  Pt  presents with functional overall swallow with adequate mastication, brisk swallow response, and occasional, mild throat-clearing after 3 oz water test.  Pt with mild increase in RR after successive bolus consumption.  Recommend initiating a heart healthy diet, thin liquids, meds whole in liquids.  SLP will f/ux1 to ensure safety/toleration of new diet.  Pt/family agree.     Aspiration Risk  No limitations    Diet Recommendation   heart healthy, thin liquids  Medication Administration: Whole meds with liquid    Other  Recommendations Oral Care Recommendations: Oral care BID   Follow up Recommendations  None    Frequency and Duration min 1 x/week  1 week       Prognosis        Swallow Study   General HPI: 82 y.o. female underwent left CEA 11/13/15; admitted 5/16 with mild aphasia, RUE weakness, difficulty swallowing (clarified to mean "lump in the throat" per pt's description), mild marginal mandibular palsy. Hx TIA, left parietal CVA 2015.  MRI negative for acute stroke.  Type of Study: Bedside Swallow Evaluation Previous Swallow Assessment: no Diet Prior to this Study: NPO Temperature Spikes Noted: No Respiratory Status: Room air History of Recent Intubation: No Behavior/Cognition: Alert;Cooperative;Pleasant mood Oral Cavity Assessment: Within Functional Limits Oral Care Completed by SLP: No Oral Cavity - Dentition: Adequate natural dentition Vision: Functional for self-feeding Self-Feeding Abilities: Able to feed self Patient Positioning: Upright in bed Baseline Vocal Quality: Normal Volitional Cough: Strong Volitional Swallow: Able to elicit  Oral/Motor/Sensory Function Overall Oral Motor/Sensory Function: Other (comment) (mild left labial asymmetry)   Ice Chips Ice chips: Within functional limits Presentation: Spoon   Thin Liquid Thin Liquid: Within functional limits (excluding occasional mild throat-clearing) Presentation: Cup    Nectar Thick Nectar Thick Liquid:  Not tested   Honey Thick Honey Thick Liquid: Not tested   Puree Puree: Within functional limits   Solid   GO   Solid: Within functional limits       Ethanjames Fontenot L. Tivis Ringer, MA CCC/SLP Pager [phone]  Juan Quam Laurice 11/22/2015,10:32 AM

## 2015-11-23 ENCOUNTER — Observation Stay (HOSPITAL_BASED_OUTPATIENT_CLINIC_OR_DEPARTMENT_OTHER): Payer: Medicare Other

## 2015-11-23 ENCOUNTER — Observation Stay (HOSPITAL_COMMUNITY): Payer: Medicare Other

## 2015-11-23 DIAGNOSIS — I6522 Occlusion and stenosis of left carotid artery: Secondary | ICD-10-CM | POA: Diagnosis not present

## 2015-11-23 DIAGNOSIS — G459 Transient cerebral ischemic attack, unspecified: Secondary | ICD-10-CM

## 2015-11-23 DIAGNOSIS — I700 Atherosclerosis of aorta: Secondary | ICD-10-CM | POA: Diagnosis not present

## 2015-11-23 DIAGNOSIS — E785 Hyperlipidemia, unspecified: Secondary | ICD-10-CM | POA: Diagnosis not present

## 2015-11-23 DIAGNOSIS — Z9889 Other specified postprocedural states: Secondary | ICD-10-CM | POA: Diagnosis not present

## 2015-11-23 LAB — HEMOGLOBIN A1C
Hgb A1c MFr Bld: 6 % — ABNORMAL HIGH (ref 4.8–5.6)
Mean Plasma Glucose: 126 mg/dL

## 2015-11-23 MED ORDER — ASPIRIN EC 81 MG PO TBEC: 81.0000 mg | DELAYED_RELEASE_TABLET | Freq: Every day | ORAL | Status: DC

## 2015-11-23 MED ORDER — ATORVASTATIN CALCIUM 10 MG PO TABS: 10.0000 mg | ORAL_TABLET | Freq: Every day | ORAL | Status: DC

## 2015-11-23 MED ORDER — WHITE PETROLATUM GEL
Status: AC
  Administered 2015-11-23: 0.2
  Filled 2015-11-23: qty 1

## 2015-11-23 NOTE — Progress Notes (Signed)
Speech Language Pathology Treatment: Dysphagia  Patient Details Name: [name] MRN: [id number] DOB: 1925-03-25 Today's Date: 11/23/2015 Time:  -     Assessment / Plan / Recommendation Clinical Impression  F/u after yesterday's swallow assessment to ensure safety/toleration.  Pt with excellent toleration of POs, no s/s of aspiration, no throat-clearing or cough after rapid, successive swallows of thin liquid.  Lungs clear.  Continue regular diet, thin liquids.  SLP services to sign off.    HPI HPI: 82 y.o. female underwent left CEA 11/13/15; admitted 5/16 with mild aphasia, RUE weakness, difficulty swallowing (clarified to mean "lump in the throat" per pt's description), mild marginal mandibular palsy. Hx TIA, left parietal CVA 2015.  MRI negative for acute stroke.       SLP Plan  All goals met     Recommendations  Diet recommendations: Regular;Thin liquid Liquids provided via: Cup;Straw Medication Administration: Whole meds with liquid Supervision: Patient able to self feed             Oral Care Recommendations: Oral care BID Follow up Recommendations: None Plan: All goals met     GO                Juan Quam Laurice 11/23/2015, 3:43 PM

## 2015-11-23 NOTE — Progress Notes (Signed)
VASCULAR LAB PRELIMINARY  PRELIMINARY  PRELIMINARY  PRELIMINARY  Left carotid duplex completed.    Preliminary report:  There is a focal hyperechoic target at the proximal end of the endarterectomy.  This may represent the plaque that remained after surgery.  No intimal flap or thrombus noted.  Verterbral artery is antegrade.  Murna Backer, RVT 11/23/2015, 11:10 AM

## 2015-11-23 NOTE — Progress Notes (Signed)
  Echocardiogram 2D Echocardiogram has been performed.  [name] 11/23/2015, 3:45 PM

## 2015-11-23 NOTE — Progress Notes (Signed)
Pt's IV became loose at hub, it was still in vein. Flushed well and retayed. Entire bed, gown and linen had to be changed due leaking from site.

## 2015-11-23 NOTE — Progress Notes (Signed)
STROKE TEAM PROGRESS NOTE   HISTORY OF PRESENT ILLNESS [name] is an 82 y.o. female patient who was brought into the emergency room for evaluation of acute stroke. She had some slurred speech and word finding difficulty. Patient has a known critical left ICA stenosis, status post left carotid endarterectomy on 11/13/2015. She has been having an uneventful postoperative period. Earlier today she was noted to have some difficulty with finding the correct word and noted to have some slurred speech per family. Denies any focal weakness or numbness in upper or lower extremities, no new vision problems. Patient was not administered IV t-PA secondary to no focal deficits. She was admitted for further evaluation and treatment.   SUBJECTIVE (INTERVAL HISTORY) Her son is at the bedside.  Overall she feels her condition is resolved. Discussed with Dr. Bridgett [name] and seems pt declined coumadin or DOACs at this time. Plan is to do DAPT and then pt will have further discussion with her neurologist Dr. Jannifer [name] and her PCP for further decision making.    OBJECTIVE Temp:  [98 F (36.7 C)-98.5 F (36.9 C)] 98.4 F (36.9 C) (05/18 1321) Pulse Rate:  [77-92] 78 (05/18 1321) Cardiac Rhythm:  [-] Normal sinus rhythm (05/18 0700) Resp:  [16-20] 20 (05/18 1321) BP: (146-181)/(60-73) 158/60 mmHg (05/18 1321) SpO2:  [94 %-97 %] 97 % (05/18 1321)  CBC:   Recent Labs Lab 11/21/15 1158  WBC 8.5  NEUTROABS 6.1  HGB 11.9*  HCT 36.4  MCV 89.0  PLT 0000000    Basic Metabolic Panel:   Recent Labs Lab 11/21/15 1158  NA 133*  K 3.5  CL 97*  CO2 27  GLUCOSE 138*  BUN 11  CREATININE 1.11*  CALCIUM 8.9    Lipid Panel:     Component Value Date/Time   CHOL 137 11/22/2015 1655   TRIG 59 11/22/2015 1655   HDL 59 11/22/2015 1655   CHOLHDL 2.3 11/22/2015 1655   VLDL 12 11/22/2015 1655   LDLCALC 66 11/22/2015 1655   HgbA1c:  Lab Results  Component Value Date   HGBA1C 6.0* 11/22/2015   Urine Drug  Screen: No results found for: LABOPIA, COCAINSCRNUR, LABBENZ, AMPHETMU, THCU, LABBARB    IMAGING  Ct Head Wo Cotrast 11/21/2015   No acute abnormality noted.   Ct Angio Head & Neck W/cm &/or Wo/cm 11/21/2015   No visible complication related to recent LEFT carotid endarterectomy. No flow reducing lesion is evident. Endarterectomy is widely patent. Atherosclerotic type aneurysmal enlargement in the cavernous and supraclinoid ICA on the LEFT, 10 x 10 x 13 mm, without internal thrombus or narrowing of the parent vessel. No acute intracranial findings are evident. However, MRI is more sensitive in the detection of acute infarction.   Mr Brain Wo Contrast 11/21/2015   No acute stroke is evident.  Chronic changes as described.   CUS - There is a focal hyperechoic target at the proximal end of the left endarterectomy. This may represent the plaque that remained after surgery. No intimal flap or thrombus noted. Verterbral artery is antegrade.  TTE pending   PHYSICAL EXAM  Temp:  [98 F (36.7 C)-98.5 F (36.9 C)] 98.4 F (36.9 C) (05/18 1321) Pulse Rate:  [77-92] 78 (05/18 1321) Resp:  [16-20] 20 (05/18 1321) BP: (146-181)/(60-73) 158/60 mmHg (05/18 1321) SpO2:  [94 %-97 %] 97 % (05/18 1321)  General - Well nourished, well developed, in no apparent distress.  Ophthalmologic - Fundi not visualized due to eye movement.  Cardiovascular -  Regular rate and rhythm.  Mental Status -  Level of arousal and orientation to time, place, and person were intact. Language including expression, naming, comprehension was assessed and found intact.  Cranial Nerves II - XII - II - Visual field intact OU. III, IV, VI - Extraocular movements intact. V - Facial sensation intact bilaterally. VII - Facial movement intact bilaterally. VIII - Hearing & vestibular intact bilaterally. X - Palate elevates symmetrically. XI - Chin turning & shoulder shrug intact bilaterally. XII - Tongue protrusion  intact.  Motor Strength - The patient's strength was normal in all extremities and pronator drift was absent.  Bulk was normal and fasciculations were absent.   Motor Tone - Muscle tone was assessed at the neck and appendages and was normal.  Reflexes - The patient's reflexes were 1+ in all extremities and she had no pathological reflexes.  Sensory - Light touch, temperature/pinprick were assessed and were symmetrical.    Coordination - The patient had normal movements in the hands with no ataxia or dysmetria.  Tremor was absent.  Gait and Station - deferred due to safety concerns.   ASSESSMENT/PLAN [name] is a 82 y.o. female with history of L CEA 11/13/2015 presenting with expressive aphasia and slurred speech. Daughter also stated that she had similar episodes 4 days ago. She did not receive IV t-PA due to minimal deficits.   Left brain TIA, most likely due to severe and extensive soft plaques and atherosclerosis of aortic arch. Due to the severity of soft plaque at aortic arch and embolic feature of the episodes and failed plavix, we would recommend anticoagulation. However, coumadin may not be a good choice. Best choice would be DOACs like eliquis if prior authorization can be made. Will discuss with Dr. Bridgett [name].  MRI  no acute stroke  CTA Head & neck  L CEA open. No other significant stenosis. However, extensive aortic arch soft plaques similar to CTA in 09/2015  2D Echo  pending  LDL 96  HgbA1c 6.0  Lovenox 40 mg sq daily for VTE prophylaxis Diet Heart Room service appropriate?: Yes; Fluid consistency:: Thin  clopidogrel 75 mg daily prior to admission, now on clopidogrel 75 mg daily. Discussed with Dr. Bridgett [name] and pt, at this time, pt is refusing coumadin or DOACs. Will d/c with DAPT, but pt will discuss with her neurologist Dr. Jannifer [name] and PCP regarding further decision.  Ongoing aggressive stroke risk factor management  Therapy recommendations:  pending  Disposition:   Pending  Severe extensive atherosclerosis of aortic arch  Likely the embolic source to explain her episodes  Recommend anticoagulation  She is not good candidate for coumadin due to advanced age  She dose not like coumadin due to frequent INR monitoring and likely to be noncompliant  Best choice will be DOACs such as eliquis, but will need prior authorization as this is not FDA approved indication for eliquis. After discussing with Dr. Bridgett [name] and pt, so far pt declined DOACs, will d/c with DAPT. Pt will discuss with her neurologist Dr. Jannifer [name] and PCP regarding further decision.  Hypertension  Stable Permissive hypertension (OK if <220/120) for 24-48 hours post stroke and then gradually normalized within 5-7 days.  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 Please call with questions. Pt will follow up with Dr. Jannifer [name] at [facility] in about 2 months. Thanks for the consult.   Rosalin Hawking, MD PhD Stroke Neurology 11/23/2015 2:24 PM   To contact Stroke Continuity provider, please refer to [email or web address] After hours, contact General Neurology

## 2015-11-23 NOTE — Progress Notes (Signed)
   Daily Progress Note  Assessment/Planning: POD #10 s/p L CEA for sx L ICA >80%, extensive aortic arch atherosclerosis possible source for embolic debris   Appreciate Neuro input.  Agree with their opinion  Discussed NOAC vs Coumadin vs ASA+Plavix with patient and family  Pt not ready to proceed with anticoagulation.  Will d/c on ASA+Plavix  Echo and B carotid duplex today and then can be discharged  She will follow up with her outpt Neuro and PCP on the NOAC   Subjective    No events, asphasia and weakness completely resolved  Objective Filed Vitals:   11/22/15 1658 11/22/15 2137 11/23/15 0124 11/23/15 0551  BP: 181/73 177/72 169/65 167/61  Pulse: 82 92  81  Temp: 98.2 F (36.8 C) 98.5 F (36.9 C) 98 F (36.7 C) 98.2 F (36.8 C)  TempSrc: Oral Oral Oral Oral  Resp: 16 18 18 18   Height:      Weight:      SpO2: 96% 96% 96% 94%   No intake or output data in the 24 hours ending 11/23/15 0834  PULM  CTAB CV  RRR GI  soft, NTND NECK No hematoma, inc c/d/i NEURO tongue midline, smile essentially sym, Motor 5/5 sym  Laboratory CBC    Component Value Date/Time   WBC 8.5 11/21/2015 1158   HGB 11.9* 11/21/2015 1158   HCT 36.4 11/21/2015 1158   PLT 261 11/21/2015 1158    BMET    Component Value Date/Time   NA 133* 11/21/2015 1158   NA 138 09/13/2015 1103   K 3.5 11/21/2015 1158   CL 97* 11/21/2015 1158   CO2 27 11/21/2015 1158   GLUCOSE 138* 11/21/2015 1158   GLUCOSE 143* 09/13/2015 1103   BUN 11 11/21/2015 1158   BUN 23 09/13/2015 1103   CREATININE 1.11* 11/21/2015 1158   CALCIUM 8.9 11/21/2015 1158   GFRNONAA 43* 11/21/2015 1158   GFRAA 50* 11/21/2015 [place], MD [facility] Office: [phone] Pager: [phone]  11/23/2015, 8:34 AM

## 2015-11-23 NOTE — Progress Notes (Signed)
Patient is discharged from room 5M16 at this time. Alert and in stable condition. IV site d/c'd as well as tele. Instructions read to patient and daughter with understanding verbalized. Left unit via wheelchair with all belongings at side.

## 2015-11-24 LAB — ECHOCARDIOGRAM LIMITED
HEIGHTINCHES: 66 in
WEIGHTICAEL: 2366.86 [oz_av]

## 2015-11-27 NOTE — Discharge Summary (Signed)
[facility] Discharge Summary  MCKINLIE SWEEZER [birth year] 82 y.o. female  [id number]  Admission Date: 11/21/2015  Discharge Date: 11/23/2015  Physician: Adele Barthel, MD  Admission Diagnosis: Aphasia [R47.01] Slurred speech [R47.81]  HPI:   This is a 82 y.o. female who underwent a left carotid endarterectomy with bovine pericardial patch angioplasty by Dr. Adele Barthel on 11/13/2015. This was done for a symptomatic greater than 80% left carotid stenosis. This patient had a left parietal stroke in December 2015. The patient had a high-grade greater than 80% left carotid stenosis. The patient has a history of a TIA with right hand weakness and paresthesias. The patient underwent preoperative cardiac clearance prior to her surgery. Of note, preoperative CTA of the neck showed advanced atherosclerotic plaque of the thoracic and distal aortic arch. There was a 50% stenosis of the proximal right internal carotid artery. It was a critical left carotid stenosis. The patient was not a candidate for carotid stenting due to the severity of the ICA disease and aortic arch disease.  The patient had some nausea and vomiting postoperatively and was in the hospital for about 4 days. She was discharged on Friday. She noted the following day, Saturday, that she was having some mild expressive aphasia. This persisted. This was worse today and she also noted some clumsiness in her right upper extremity when she was doing her crossword possible. She denies any sudden onset of weakness or paresthesias in either upper extremity or lower extremities.  Hospital Course:  The patient was admitted to the hospital and neurology was consulted. They recommended a CT angiogram of the head and neck and MRI. A duplex of the carotids were also ordered.   The patient had no evidence of acute stroke on her imaging studies. Her carotid duplex revealed no intimal flap or thrombus. Given the severity of the  atherosclerotic burden in her aortic arch, there were concerns for embolization. Neurology suggested anticoagulation with a DOAC however patient did not want to proceed with this option. The patient was agreeable to discharge home on aspirin and Plavix. The patient's aphasia and weakness had resolved. She was discharged home on hospital day 2. She was to follow-up with her neurologist Dr. Jannifer Franklin in 2 months.     CBC    Component Value Date/Time   WBC 8.5 11/21/2015 1158   RBC 4.09 11/21/2015 1158   HGB 11.9* 11/21/2015 1158   HCT 36.4 11/21/2015 1158   PLT 261 11/21/2015 1158   MCV 89.0 11/21/2015 1158   MCH 29.1 11/21/2015 1158   MCHC 32.7 11/21/2015 1158   RDW 14.2 11/21/2015 1158   LYMPHSABS 1.3 11/21/2015 1158   MONOABS 1.0 11/21/2015 1158   EOSABS 0.1 11/21/2015 1158   BASOSABS 0.0 11/21/2015 1158    BMET    Component Value Date/Time   NA 133* 11/21/2015 1158   NA 138 09/13/2015 1103   K 3.5 11/21/2015 1158   CL 97* 11/21/2015 1158   CO2 27 11/21/2015 1158   GLUCOSE 138* 11/21/2015 1158   GLUCOSE 143* 09/13/2015 1103   BUN 11 11/21/2015 1158   BUN 23 09/13/2015 1103   CREATININE 1.11* 11/21/2015 1158   CALCIUM 8.9 11/21/2015 1158   GFRNONAA 43* 11/21/2015 1158   GFRAA 50* 11/21/2015 1158     Discharge Instructions:   The patient is discharged to home with extensive instructions on wound care and progressive ambulation.  They are instructed not to drive or perform any heavy lifting until returning to  see the physician in his office.  Discharge Instructions    Ambulatory referral to Neurology    Complete by:  As directed   Follow up with Dr. Jannifer Franklin at [facility] in about 2 months. Thanks.     CAROTID Sugery: Call MD for difficulty swallowing or speaking; weakness in arms or legs that is a new symtom; severe headache.  If you have increased swelling in the neck and/or  are having difficulty breathing, CALL 911    Complete by:  As directed      Call MD for:  redness,  tenderness, or signs of infection (pain, swelling, bleeding, redness, odor or green/yellow discharge around incision site)    Complete by:  As directed      Call MD for:  severe or increased pain, loss or decreased feeling  in affected limb(s)    Complete by:  As directed      Call MD for:  temperature >100.5    Complete by:  As directed      Discharge wound care:    Complete by:  As directed   Wash incision daily with soap and water and pat dry.     Driving Restrictions    Complete by:  As directed   No driving for 2 weeks     Increase activity slowly    Complete by:  As directed   Walk with assistance use walker or cane as needed     Lifting restrictions    Complete by:  As directed   No lifting for 2 weeks     Resume previous diet    Complete by:  As directed            Discharge Diagnosis:  Aphasia [R47.01] Slurred speech [R47.81]  Secondary Diagnosis: Patient Active Problem List   Diagnosis Date Noted  . Carotid stenosis 11/21/2015  . Asymptomatic carotid artery stenosis 11/13/2015  . Impaired glucose tolerance 10/27/2015  . Solitary pulmonary nodule 10/27/2015  . Thyroid nodule 10/27/2015  . Syncope 10/04/2015  . Bradycardia with less than 60 beats per minute 10/04/2015  . Paresthesia 10/03/2014  . History of embolic stroke [id number]  . Paresthesia of both feet 07/06/2014  . Aortic arch atherosclerosis ([place]) 06/24/2014  . Atherosclerotic ulcer of aorta ([place]) 06/24/2014  . Stroke ([place]) 06/22/2014  . CVA (cerebral infarction) 06/22/2014  . Bilateral carotid artery disease ([place]) 05/18/2014  . TOBACCO USE, QUIT 04/12/2009  . [place] DISEASE, LUMBAR 12/16/2008  . DIVERTICULOSIS, COLON 09/30/2008  . Personal history of colonic polyps 09/30/2008  . DYSPNEA 07/13/2008  . WEAKNESS 11/09/2007  . Hypothyroidism 10/13/2007  . Hyperlipidemia 03/06/2007  . Essential hypertension 03/06/2007  . Osteoarthritis 03/06/2007   Past Medical History  Diagnosis Date  . [place]  DISEASE, LUMBAR 12/16/2008  . DIVERTICULOSIS, COLON 09/30/2008  . DYSPNEA 07/13/2008  . HYPERLIPIDEMIA 03/06/2007  . HYPERTENSION 03/06/2007  . HYPOTHYROIDISM 10/13/2007  . OSTEOARTHRITIS 03/06/2007  . Personal history of colonic polyps 09/30/2008  . TOBACCO USE, QUIT 04/12/2009  . WEAKNESS 11/09/2007  . Graves disease   . Aortic arch atherosclerosis ([place]) 06/24/2014  . Atherosclerotic ulcer of aorta ([place]) 06/24/2014  . History of embolic stroke [id number]    Left brain  . Stroke ([place])     06/2014           . Anxiety        Medication List    TAKE these medications        acetaminophen 325 MG tablet  Commonly known as:  TYLENOL  Take 1 tablet (325 mg total) by mouth every 6 (six) hours as needed.     ALPRAZolam 0.5 MG tablet  Commonly known as:  XANAX  Take 1 tablet (0.5 mg total) by mouth daily as needed for anxiety.     amLODipine 10 MG tablet  Commonly known as:  NORVASC  Take 1 tablet (10 mg total) by mouth daily.     aspirin EC 81 MG tablet  Take 1 tablet (81 mg total) by mouth daily.     atorvastatin 10 MG tablet  Commonly known as:  LIPITOR  Take 1 tablet (10 mg total) by mouth daily at 6 PM.     bisacodyl 5 MG EC tablet  Commonly known as:  DULCOLAX  Take 10 mg by mouth daily as needed for moderate constipation.     clopidogrel 75 MG tablet  Commonly known as:  PLAVIX  Take 1 tablet (75 mg total) by mouth daily.     docusate sodium 100 MG capsule  Commonly known as:  COLACE  Take 100 mg by mouth 2 (two) times daily.     hydrALAZINE 50 MG tablet  Commonly known as:  APRESOLINE  Take 1.5 tablets (75 mg total) by mouth 3 (three) times daily.     levothyroxine 75 MCG tablet  Commonly known as:  SYNTHROID, LEVOTHROID  TAKE 1 TABLET BY MOUTH EVERY DAY        Disposition: home  Patient's condition: is Good  Follow up: 1. Dr. Bridgett Larsson 12/08/15 2. Dr. Jannifer Franklin (neurology) in 2 months.    Virgina Jock, PA-C [facility] [phone] 11/27/2015   12:50 PM  Addendum  I agree with the PA's discharge summary.  This patient underwent an uneventful long L CEA with an extended hospitalization due to her age and some cerebral hyperperfusion sx.  These resolved with time.  She returned to the hospital as documented with reported aphasia and right side weakness.  By the time she was evaluated, there was no evidence of either.  Her diagnostic studies: CTA Neck and head, MRI Head failed to demonstrate any new stroke.  Given the severity of her aortic arch atherosclerosis, concern for embolic phenomena was raised without any evidence of such.  Neurology recommended starting a NOAC but the patient declined such, electing instead Plavix and ASA.  She is going to readdress the issue with her PCP and outpatient neurology in follow up.  The patient will follow up with me in the office for post-op incision check in 1-2 weeks.  Adele Barthel, MD [facility] [place] Office: [phone] Pager: [phone]  11/27/2015, 1:39 PM

## 2015-12-01 ENCOUNTER — Ambulatory Visit: Payer: Medicare Other | Admitting: Internal Medicine

## 2015-12-01 ENCOUNTER — Encounter: Payer: Self-pay | Admitting: Internal Medicine

## 2015-12-01 ENCOUNTER — Ambulatory Visit (INDEPENDENT_AMBULATORY_CARE_PROVIDER_SITE_OTHER): Payer: Medicare Other | Admitting: Internal Medicine

## 2015-12-01 VITALS — BP 142/74 | HR 92 | Temp 97.7°F | Resp 22 | Ht 66.0 in | Wt 142.0 lb

## 2015-12-01 DIAGNOSIS — I700 Atherosclerosis of aorta: Secondary | ICD-10-CM | POA: Diagnosis not present

## 2015-12-01 DIAGNOSIS — I739 Peripheral vascular disease, unspecified: Secondary | ICD-10-CM

## 2015-12-01 DIAGNOSIS — E039 Hypothyroidism, unspecified: Secondary | ICD-10-CM

## 2015-12-01 DIAGNOSIS — I779 Disorder of arteries and arterioles, unspecified: Secondary | ICD-10-CM

## 2015-12-01 DIAGNOSIS — E785 Hyperlipidemia, unspecified: Secondary | ICD-10-CM

## 2015-12-01 DIAGNOSIS — Z5189 Encounter for other specified aftercare: Secondary | ICD-10-CM

## 2015-12-01 DIAGNOSIS — Z9889 Other specified postprocedural states: Secondary | ICD-10-CM | POA: Diagnosis not present

## 2015-12-01 DIAGNOSIS — I719 Aortic aneurysm of unspecified site, without rupture: Secondary | ICD-10-CM

## 2015-12-01 MED ORDER — ATORVASTATIN CALCIUM 80 MG PO TABS: 80.0000 mg | ORAL_TABLET | Freq: Every day | ORAL | Status: DC

## 2015-12-01 MED ORDER — ATORVASTATIN CALCIUM 10 MG PO TABS: 80.0000 mg | ORAL_TABLET | Freq: Every day | ORAL | Status: DC

## 2015-12-01 NOTE — Progress Notes (Signed)
Pre visit review using our clinic review tool, if applicable. No additional management support is needed unless otherwise documented below in the visit note. 

## 2015-12-01 NOTE — Patient Instructions (Signed)
Neurology and vascular surgery follow-up as scheduled  Limit your sodium (Salt) intake  Continue physical therapy

## 2015-12-01 NOTE — Progress Notes (Signed)
Subjective:    Patient ID: [name], female    DOB: [birth year], 82 y.o.   MRN: [id number]  HPI  Admission Date: 11/21/2015  Discharge Date: 11/23/2015  Physician: Adele Barthel, MD  Admission Diagnosis: Aphasia [R47.01] Slurred speech [R47.81]  HPI:  This is a 82 y.o. female who underwent a left carotid endarterectomy with bovine pericardial patch angioplasty by Dr. Adele Barthel on 11/13/2015. This was done for a symptomatic greater than 80% left carotid stenosis. This patient had a left parietal stroke in December 2015. The patient had a high-grade greater than 80% left carotid stenosis. The patient has a history of a TIA with right hand weakness and paresthesias. The patient underwent preoperative cardiac clearance prior to her surgery. Of note, preoperative CTA of the neck showed advanced atherosclerotic plaque of the thoracic and distal aortic arch. There was a 50% stenosis of the proximal right internal carotid artery. It was a critical left carotid stenosis. The patient was not a candidate for carotid stenting due to the severity of the ICA disease and aortic arch disease.  The patient had some nausea and vomiting postoperatively and was in the hospital for about 4 days. She was discharged on Friday. She noted the following day, Saturday, that she was having some mild expressive aphasia. This persisted. This was worse today and she also noted some clumsiness in her right upper extremity when she was doing her crossword possible. She denies any sudden onset of weakness or paresthesias in either upper extremity or lower extremities.  Hospital Course:  The patient was admitted to the hospital and neurology was consulted. They recommended a CT angiogram of the head and neck and MRI. A duplex of the carotids were also ordered.   The patient had no evidence of acute stroke on her imaging studies. Her carotid duplex revealed no intimal flap or thrombus. Given the severity of the  atherosclerotic burden in her aortic arch, there were concerns for embolization. Neurology suggested anticoagulation with a DOAC however patient did not want to proceed with this option. The patient was agreeable to discharge home on aspirin and Plavix. The patient's aphasia and weakness had resolved. She was discharged home on hospital day 2. She was to follow-up with her neurologist Dr. Jannifer Franklin in 2 months.   11/30/15.  82 year old patient who is seen today for transitional care management following a recent hospital discharge.  7 days ago.  Hospital records reviewed.  There is been some confusion about her discharge medications. Since her discharge she has done well.  She continues to receive home physical therapy 2-3 times per week.  She remains weak and slightly unsteady and does use a walker.  She did have a swallowing evaluation during the hospital admission, which was normal.  She does complain of some occasional difficulty with sipping water from a straw but this has improved over the past couple of days.  She states she has no difficulty with other liquids such as orange juice.  Her daughter asked whether a speech therapy consult would be helpful. She has had no further speech difficulties or any focal neurological symptoms  Past Medical History  Diagnosis Date  . [place] DISEASE, LUMBAR 12/16/2008  . DIVERTICULOSIS, COLON 09/30/2008  . DYSPNEA 07/13/2008  . HYPERLIPIDEMIA 03/06/2007  . HYPERTENSION 03/06/2007  . HYPOTHYROIDISM 10/13/2007  . OSTEOARTHRITIS 03/06/2007  . Personal history of colonic polyps 09/30/2008  . TOBACCO USE, QUIT 04/12/2009  . WEAKNESS 11/09/2007  . Graves disease   . Aortic  arch atherosclerosis ([place]) 06/24/2014  . Atherosclerotic ulcer of aorta ([place]) 06/24/2014  . History of embolic stroke [id number]    Left brain  . Stroke ([place])     06/2014           . Anxiety      Social History   Social History  . Marital Status: Divorced    Spouse Name: N/A  . Number of Children:  4  . Years of Education: college   Occupational History  . Not on file.   Social History Main Topics  . Smoking status: Former Smoker    Quit date: 07/08/1978  . Smokeless tobacco: Never Used  . Alcohol Use: 4.2 oz/week    7 Glasses of wine per week     Comment: "red wine"  . Drug Use: No  . Sexual Activity: Not on file   Other Topics Concern  . Not on file   Social History Narrative   Patient is right handed.   Patient drinks 1 cup caffeine daily.    Past Surgical History  Procedure Laterality Date  . Cataract extraction    . Knee surgery    . Cholecystectomy    . Cardiac catheterization    . Tee without cardioversion N/A 06/24/2014    Procedure: TRANSESOPHAGEAL ECHOCARDIOGRAM (TEE);  Surgeon: Sanda Klein, MD;  [facility] ENDOSCOPY;  Service: Cardiovascular;  Laterality: N/A;  . Endarterectomy Left 11/13/2015    Procedure: LEFT CAROTID ARTERY ENDARTERECTOMY;  Surgeon: Conrad Val Verde, MD;  Location: [facility];  Service: Vascular;  Laterality: Left;  . Patch angioplasty Left 11/13/2015    Procedure: WITH 1CM X 6CM  XENOSURE BIOLOGIC PATCH ANGIOPLASTY;  Surgeon: Conrad Kirby, MD;  Location: [facility] OR;  Service: Vascular;  Laterality: Left;    Family History  Problem Relation Age of Onset  . Cancer Maternal Grandmother     stomach    Allergies  Allergen Reactions  . Catapres [Clonidine Hcl] Other (See Comments)    Made pt feel horrible, shaky, weak and nausea  . Lisinopril Anaphylaxis    Tongue swelling  . Labetalol Hcl Other (See Comments)    Caused bradycardia and syncope    Current Outpatient Prescriptions on File Prior to Visit  Medication Sig Dispense Refill  . acetaminophen (TYLENOL) 325 MG tablet Take 1 tablet (325 mg total) by mouth every 6 (six) hours as needed.    . ALPRAZolam (XANAX) 0.5 MG tablet Take 1 tablet (0.5 mg total) by mouth daily as needed for anxiety. 30 tablet 1  . amLODipine (NORVASC) 10 MG tablet Take 1 tablet (10 mg total) by mouth daily. 30  tablet 11  . aspirin EC 81 MG tablet Take 1 tablet (81 mg total) by mouth daily. 30 tablet 0  . bisacodyl (DULCOLAX) 5 MG EC tablet Take 10 mg by mouth daily as needed for moderate constipation.    . clopidogrel (PLAVIX) 75 MG tablet Take 1 tablet (75 mg total) by mouth daily. 30 tablet 0  . docusate sodium (COLACE) 100 MG capsule Take 100 mg by mouth 2 (two) times daily.    . hydrALAZINE (APRESOLINE) 50 MG tablet Take 1.5 tablets (75 mg total) by mouth 3 (three) times daily. 135 tablet 3  . levothyroxine (SYNTHROID, LEVOTHROID) 75 MCG tablet TAKE 1 TABLET BY MOUTH EVERY DAY 90 tablet 1   No current facility-administered medications on file prior to visit.    BP 142/74 mmHg  Pulse 92  Temp(Src) 97.7 F (36.5 C) (Oral)  Resp 22  Ht 5\' 6"  (1.676 m)  Wt 142 lb (64.411 kg)  BMI 22.93 kg/m2  SpO2 98%     Review of Systems  HENT: Negative for congestion, dental problem, hearing loss, rhinorrhea, sinus pressure, sore throat and tinnitus.   Eyes: Negative for pain, discharge and visual disturbance.  Respiratory: Negative for cough and shortness of breath.   Cardiovascular: Negative for chest pain, palpitations and leg swelling.  Gastrointestinal: Negative for nausea, vomiting, abdominal pain, diarrhea, constipation, blood in stool and abdominal distention.  Genitourinary: Negative for dysuria, urgency, frequency, hematuria, flank pain, vaginal bleeding, vaginal discharge, difficulty urinating, vaginal pain and pelvic pain.  Musculoskeletal: Positive for gait problem. Negative for joint swelling and arthralgias.  Skin: Negative for rash.  Neurological: Positive for weakness. Negative for dizziness, syncope, speech difficulty, numbness and headaches.  Hematological: Negative for adenopathy.  Psychiatric/Behavioral: Negative for behavioral problems, dysphoric mood and agitation. The patient is not nervous/anxious.        Objective:   Physical Exam  Constitutional: She is oriented to  person, place, and time. She appears well-developed and well-nourished.  HENT:  Head: Normocephalic.  Right Ear: External ear normal.  Left Ear: External ear normal.  Mouth/Throat: Oropharynx is clear and moist.  Eyes: Conjunctivae and EOM are normal. Pupils are equal, round, and reactive to light.  Neck: Normal range of motion. Neck supple. No thyromegaly present.  Nicely healing left CEA scar  Cardiovascular: Normal rate, regular rhythm, normal heart sounds and intact distal pulses.   Pulmonary/Chest: Effort normal and breath sounds normal.  O2 saturation 98%  Abdominal: Soft. Bowel sounds are normal. She exhibits no mass. There is no tenderness.  Musculoskeletal: Normal range of motion.  Lymphadenopathy:    She has no cervical adenopathy.  Neurological: She is alert and oriented to person, place, and time.  No drift Normal finger to nose testing  Possible some depression of the right pharyngeal arch  Skin: Skin is warm and dry. No rash noted.  Psychiatric: She has a normal mood and affect. Her behavior is normal.          Assessment & Plan:   Status post left carotid endarterectomy Atherosclerosis of the aortic arch.  Continue aspirin 81, and Plavix 75, at least for an additional 3 weeks.  Follow-up neurology and vascular surgery Essential hypertension Dyslipidemia.  Continue atorvastatin 80 Deconditioning.  Continue home physical therapy Cerebrovascular disease   Nyoka Cowden, MD

## 2015-12-05 ENCOUNTER — Encounter: Payer: Self-pay | Admitting: Vascular Surgery

## 2015-12-06 DIAGNOSIS — Z7689 Persons encountering health services in other specified circumstances: Secondary | ICD-10-CM

## 2015-12-06 NOTE — Progress Notes (Signed)
Postoperative Visit   History of Present Illness  [name] is a 82 y.o. female who presents for postoperative follow-up for: L CEA (Date: 11/13/15).  The patient's neck incision is healed.  The patient has had possible TIA symptoms.  Pt came back to ED on 11/21/15 with reported aphasia.  Work-up was negative for acute stroke.  Both neurology and I had concerns this patient had a thromboembolism from her extensive aortic arch atherosclerosis.  She was reluctant to start anticoagulation and elected to take ASA and Plavix instead.  At this point, her sx are: per family occasional ?L facial droop.  Pt denies any sx except some choking on liquid swallowing.  She also notes some worsening of her voice.  For VQI Use Only  PRE-ADM LIVING: Home  AMB STATUS: Ambulatory  Social History   Social History  . Marital Status: Divorced    Spouse Name: N/A  . Number of Children: 4  . Years of Education: college   Occupational History  . Not on file.   Social History Main Topics  . Smoking status: Former Smoker    Quit date: 07/08/1978  . Smokeless tobacco: Never Used  . Alcohol Use: 4.2 oz/week    7 Glasses of wine per week     Comment: "red wine"  . Drug Use: No  . Sexual Activity: Not on file   Other Topics Concern  . Not on file   Social History Narrative   Patient is right handed.   Patient drinks 1 cup caffeine daily.    Current Outpatient Prescriptions on File Prior to Visit  Medication Sig Dispense Refill  . acetaminophen (TYLENOL) 325 MG tablet Take 1 tablet (325 mg total) by mouth every 6 (six) hours as needed.    . ALPRAZolam (XANAX) 0.5 MG tablet Take 1 tablet (0.5 mg total) by mouth daily as needed for anxiety. 30 tablet 1  . amLODipine (NORVASC) 10 MG tablet Take 1 tablet (10 mg total) by mouth daily. 30 tablet 11  . aspirin EC 81 MG tablet Take 1 tablet (81 mg total) by mouth daily. 30 tablet 0  . atorvastatin (LIPITOR) 80 MG tablet Take 1 tablet (80 mg total) by  mouth daily at 6 PM. 90 tablet 2  . bisacodyl (DULCOLAX) 5 MG EC tablet Take 10 mg by mouth daily as needed for moderate constipation.    . clopidogrel (PLAVIX) 75 MG tablet Take 1 tablet (75 mg total) by mouth daily. 30 tablet 0  . docusate sodium (COLACE) 100 MG capsule Take 100 mg by mouth 2 (two) times daily.    . hydrALAZINE (APRESOLINE) 50 MG tablet Take 1.5 tablets (75 mg total) by mouth 3 (three) times daily. 135 tablet 3  . levothyroxine (SYNTHROID, LEVOTHROID) 75 MCG tablet TAKE 1 TABLET BY MOUTH EVERY DAY 90 tablet 1   No current facility-administered medications on file prior to visit.    Physical Examination  Filed Vitals:   12/08/15 1011 12/08/15 1012  BP: 168/83 167/87  Pulse: 91     L Neck: Incision is healed, no LAD Throat: Minimal erythema Neuro: CN 2-12 are intact , Motor strength is 5/5 bilaterally, sensation is grossly intact   Medical Decision Making  [name] is a 82 y.o. female who presents s/p L CEA.   The patient's neck incision is healing with no obvious stroke symptoms.  No neuro deficit at this time, but I would not be surprised if she is periodically embolizing  from her aortic arch.  The patient continues to decline ANTICOAGULATION but is willing to continue ASA + Plavix.  The patient was evaluated in the hospital for dysphagia and her evaluation was relatively normal, so I'm not certain why she's choking on liquids.  I will have her follow up as an outpatient with Speech Therapy.  In regards to her voice, she had no evidence of compromised voice within the first 2 weeks after the procedure, so I suspect the vocal issues are unrelated to the L CEA. I discussed in depth with the patient the nature of atherosclerosis, and emphasized the importance of maximal medical management including strict control of blood pressure, blood glucose, and lipid levels, obtaining regular exercise, anti-platelet use and cessation of smoking.   The patient is  currently on an antiplatelet: ASA. The patient is currently on a statin: Lipitor , but will be started on Lipitor 10 mg PO daily, to be titrated and managed by their primary care physician.   The patient is aware that without maximal medical management the underlying atherosclerotic disease process will progress, limiting the benefit of any interventions. The patient's surveillance will included routine carotid duplex studies which will be completed in: 9 months, at which time the patient will be re-evaluated.   I emphasized the importance of routine surveillance of the carotid arteries as recurrence of stenosis is possible, especially with proper management of underlying atherosclerotic disease. The patient agrees to participate in their maximal medical care and routine surveillance. I will have her follow up in one month to see how her swallow and voices issues have resolved.  Thank you for allowing [place] to participate in this patient's care.  Adele Barthel, MD, FACS [facility] [place] Office: [phone] Pager: [phone]  12/06/2015, 8:38 AM

## 2015-12-08 ENCOUNTER — Ambulatory Visit (INDEPENDENT_AMBULATORY_CARE_PROVIDER_SITE_OTHER): Payer: Medicare Other | Admitting: Vascular Surgery

## 2015-12-08 VITALS — BP 167/87 | HR 91 | Ht 66.0 in | Wt 141.9 lb

## 2015-12-08 DIAGNOSIS — R4702 Dysphasia: Secondary | ICD-10-CM

## 2015-12-08 DIAGNOSIS — I739 Peripheral vascular disease, unspecified: Secondary | ICD-10-CM

## 2015-12-08 DIAGNOSIS — I779 Disorder of arteries and arterioles, unspecified: Secondary | ICD-10-CM

## 2015-12-08 MED ORDER — CLOPIDOGREL BISULFATE 75 MG PO TABS: 75.0000 mg | ORAL_TABLET | Freq: Every day | ORAL | Status: DC

## 2015-12-08 MED ORDER — ATORVASTATIN CALCIUM 40 MG PO TABS: ORAL_TABLET | ORAL | Status: DC

## 2015-12-11 ENCOUNTER — Other Ambulatory Visit (INDEPENDENT_AMBULATORY_CARE_PROVIDER_SITE_OTHER): Payer: Medicare Other

## 2015-12-11 ENCOUNTER — Other Ambulatory Visit (HOSPITAL_COMMUNITY): Payer: Self-pay | Admitting: Vascular Surgery

## 2015-12-11 ENCOUNTER — Ambulatory Visit (INDEPENDENT_AMBULATORY_CARE_PROVIDER_SITE_OTHER)
Admission: RE | Admit: 2015-12-11 | Discharge: 2015-12-11 | Disposition: A | Discharge status: Home / Self Care | Payer: Medicare Other | Source: Ambulatory Visit | Attending: Internal Medicine | Admitting: Internal Medicine

## 2015-12-11 ENCOUNTER — Encounter: Payer: Self-pay | Admitting: Internal Medicine

## 2015-12-11 ENCOUNTER — Telehealth: Payer: Self-pay | Admitting: Vascular Surgery

## 2015-12-11 ENCOUNTER — Telehealth: Payer: Self-pay | Admitting: Internal Medicine

## 2015-12-11 ENCOUNTER — Ambulatory Visit (INDEPENDENT_AMBULATORY_CARE_PROVIDER_SITE_OTHER): Payer: Medicare Other | Admitting: Internal Medicine

## 2015-12-11 VITALS — BP 178/80 | HR 90 | Temp 97.4°F | Wt 142.0 lb

## 2015-12-11 DIAGNOSIS — R000 Tachycardia, unspecified: Secondary | ICD-10-CM

## 2015-12-11 DIAGNOSIS — R739 Hyperglycemia, unspecified: Secondary | ICD-10-CM

## 2015-12-11 DIAGNOSIS — R0602 Shortness of breath: Secondary | ICD-10-CM

## 2015-12-11 DIAGNOSIS — I10 Essential (primary) hypertension: Secondary | ICD-10-CM | POA: Diagnosis not present

## 2015-12-11 DIAGNOSIS — R112 Nausea with vomiting, unspecified: Secondary | ICD-10-CM

## 2015-12-11 DIAGNOSIS — R1310 Dysphagia, unspecified: Secondary | ICD-10-CM

## 2015-12-11 DIAGNOSIS — R0600 Dyspnea, unspecified: Secondary | ICD-10-CM

## 2015-12-11 DIAGNOSIS — R7302 Impaired glucose tolerance (oral): Secondary | ICD-10-CM

## 2015-12-11 LAB — CBC WITH DIFFERENTIAL/PLATELET
Basophils Absolute: 0 10*3/uL (ref 0.0–0.1)
Basophils Relative: 0.6 % (ref 0.0–3.0)
Eosinophils Absolute: 0.1 10*3/uL (ref 0.0–0.7)
Eosinophils Relative: 1.3 % (ref 0.0–5.0)
HCT: 35.6 % — ABNORMAL LOW (ref 36.0–46.0)
Hemoglobin: 11.9 g/dL — ABNORMAL LOW (ref 12.0–15.0)
Lymphocytes Relative: 16.9 % (ref 12.0–46.0)
Lymphs Abs: 1.1 10*3/uL (ref 0.7–4.0)
MCHC: 33.5 g/dL (ref 30.0–36.0)
MCV: 89.2 fl (ref 78.0–100.0)
Monocytes Absolute: 0.7 10*3/uL (ref 0.1–1.0)
Monocytes Relative: 10.9 % (ref 3.0–12.0)
Neutro Abs: 4.4 10*3/uL (ref 1.4–7.7)
Neutrophils Relative %: 70.3 % (ref 43.0–77.0)
Platelets: 269 10*3/uL (ref 150.0–400.0)
RBC: 3.99 Mil/uL (ref 3.87–5.11)
RDW: 14.8 % (ref 11.5–15.5)
WBC: 6.3 10*3/uL (ref 4.0–10.5)

## 2015-12-11 LAB — URINALYSIS, ROUTINE W REFLEX MICROSCOPIC
BILIRUBIN URINE: NEGATIVE
HGB URINE DIPSTICK: NEGATIVE
KETONES UR: NEGATIVE
NITRITE: NEGATIVE
RBC / HPF: NONE SEEN (ref 0–?)
SPECIFIC GRAVITY, URINE: 1.01 (ref 1.000–1.030)
URINE GLUCOSE: NEGATIVE
UROBILINOGEN UA: 0.2 (ref 0.0–1.0)
pH: 6.5 (ref 5.0–8.0)

## 2015-12-11 LAB — GLUCOSE, POCT (MANUAL RESULT ENTRY): POC GLUCOSE: 147 mg/dL — AB (ref 70–99)

## 2015-12-11 LAB — BASIC METABOLIC PANEL
BUN: 14 mg/dL (ref 6–23)
CHLORIDE: 101 meq/L (ref 96–112)
CO2: 29 meq/L (ref 19–32)
CREATININE: 1.09 mg/dL (ref 0.40–1.20)
Calcium: 9.4 mg/dL (ref 8.4–10.5)
GFR: 50.3 mL/min — ABNORMAL LOW (ref >60.00)
Glucose, Bld: 125 mg/dL — ABNORMAL HIGH (ref 70–99)
POTASSIUM: 3.9 meq/L (ref 3.5–5.1)
Sodium: 138 mEq/L (ref 135–145)

## 2015-12-11 LAB — SEDIMENTATION RATE: Sed Rate: 14 mm/hr (ref 0–30)

## 2015-12-11 LAB — LIPASE: Lipase: 21 U/L (ref 11.0–59.0)

## 2015-12-11 LAB — BRAIN NATRIURETIC PEPTIDE: Pro B Natriuretic peptide (BNP): 46 pg/mL (ref 0.0–100.0)

## 2015-12-11 MED ORDER — ONDANSETRON HCL 4 MG PO TABS: 4.0000 mg | ORAL_TABLET | Freq: Three times a day (TID) | ORAL | Status: DC | PRN

## 2015-12-11 MED ORDER — OMEPRAZOLE 40 MG PO CPDR: 40.0000 mg | DELAYED_RELEASE_CAPSULE | Freq: Every day | ORAL | Status: DC

## 2015-12-11 MED ORDER — CEFUROXIME AXETIL 250 MG PO TABS: 250.0000 mg | ORAL_TABLET | Freq: Two times a day (BID) | ORAL | Status: DC

## 2015-12-11 NOTE — Telephone Encounter (Signed)
Is requesting call back as soon as labs and x ray results come back.  Also was told by the pharmacy that the script for prilosec could reduce the effect of the plavix by the pharmacist.  Would like a call back in regard.

## 2015-12-11 NOTE — Progress Notes (Signed)
Subjective:  Patient ID: [name], female    DOB: October 05, 1924  Age: 82 y.o. MRN: [id number]  CC: No chief complaint on file.   HPI [place] presents for a cough, chocking. The pt's GD is a Electrical engineer - suggested thickened liquids. Brain MRI was ok. C/o nausea, not eating since Sat am The pt had a L fascial droop 2 wks ago - MRI S/p carotid endarterectomy on 11/21/15 - doing well  Outpatient Prescriptions Prior to Visit  Medication Sig Dispense Refill  . acetaminophen (TYLENOL) 325 MG tablet Take 1 tablet (325 mg total) by mouth every 6 (six) hours as needed.    . ALPRAZolam (XANAX) 0.5 MG tablet Take 1 tablet (0.5 mg total) by mouth daily as needed for anxiety. 30 tablet 1  . amLODipine (NORVASC) 10 MG tablet Take 1 tablet (10 mg total) by mouth daily. 30 tablet 11  . aspirin EC 81 MG tablet Take 1 tablet (81 mg total) by mouth daily. 30 tablet 0  . bisacodyl (DULCOLAX) 5 MG EC tablet Take 10 mg by mouth daily as needed for moderate constipation.    . clopidogrel (PLAVIX) 75 MG tablet Take 1 tablet (75 mg total) by mouth daily. 30 tablet 11  . docusate sodium (COLACE) 100 MG capsule Take 100 mg by mouth 2 (two) times daily. Reported on 12/08/2015    . hydrALAZINE (APRESOLINE) 50 MG tablet Take 1.5 tablets (75 mg total) by mouth 3 (three) times daily. 135 tablet 3  . levothyroxine (SYNTHROID, LEVOTHROID) 75 MCG tablet TAKE 1 TABLET BY MOUTH EVERY DAY 90 tablet 1  . atorvastatin (LIPITOR) 40 MG tablet Take 2 PO qhs (Patient not taking: Reported on 12/11/2015) 60 tablet 11   No facility-administered medications prior to visit.    ROS Review of Systems  Constitutional: Positive for fatigue. Negative for fever, chills, activity change, appetite change and unexpected weight change.  HENT: Negative for congestion, mouth sores, sinus pressure and sore throat.   Eyes: Negative for visual disturbance.  Respiratory: Positive for cough and shortness of breath. Negative for apnea,  chest tightness, wheezing and stridor.   Cardiovascular: Negative for chest pain.  Gastrointestinal: Positive for nausea. Negative for vomiting, abdominal pain and diarrhea.  Endocrine: Negative for polyuria.  Genitourinary: Negative for urgency, frequency, difficulty urinating and vaginal pain.  Musculoskeletal: Negative for back pain, gait problem and neck stiffness.  Skin: Negative for pallor and rash.  Neurological: Positive for weakness. Negative for dizziness, tremors, speech difficulty, numbness and headaches.  Psychiatric/Behavioral: Negative for confusion and sleep disturbance.    Objective:  BP 178/80 mmHg  Pulse 90  Temp(Src) 97.4 F (36.3 C) (Oral)  Wt 142 lb (64.411 kg)  SpO2 96%  BP Readings from Last 3 Encounters:  12/11/15 178/80  12/08/15 167/87  12/01/15 142/74    Wt Readings from Last 3 Encounters:  12/11/15 142 lb (64.411 kg)  12/08/15 141 lb 14.4 oz (64.365 kg)  12/01/15 142 lb (64.411 kg)    Physical Exam  Constitutional: She appears well-developed. No distress.  HENT:  Head: Normocephalic.  Right Ear: External ear normal.  Left Ear: External ear normal.  Nose: Nose normal.  Mouth/Throat: Oropharynx is clear and moist.  Eyes: Conjunctivae are normal. Pupils are equal, round, and reactive to light. Right eye exhibits no discharge. Left eye exhibits no discharge.  Neck: Normal range of motion. Neck supple. No JVD present. No tracheal deviation present. No thyromegaly present.  Cardiovascular: Normal rate and normal  heart sounds.   Pulmonary/Chest: No stridor. No respiratory distress. She has no wheezes.  Abdominal: Soft. Bowel sounds are normal. She exhibits no distension and no mass. There is no tenderness. There is no rebound and no guarding.  Musculoskeletal: She exhibits no edema or tenderness.  Lymphadenopathy:    She has no cervical adenopathy.  Neurological: She displays normal reflexes. No cranial nerve deficit. She exhibits normal muscle  tone. Coordination abnormal.  Skin: No rash noted. No erythema.  Psychiatric: She has a normal mood and affect. Her behavior is normal. Judgment and thought content normal.  neck w/a healing scar Cane irreg rhythm and rate   Procedure: EKG Indication: irreg beats Impression: NSR. PACs. ILBBB  Lab Results  Component Value Date   WBC 8.5 11/21/2015   HGB 11.9* 11/21/2015   HCT 36.4 11/21/2015   PLT 261 11/21/2015   GLUCOSE 138* 11/21/2015   CHOL 137 11/22/2015   TRIG 59 11/22/2015   HDL 59 11/22/2015   LDLDIRECT 141.7 05/01/2011   LDLCALC 66 11/22/2015   ALT 76* 11/21/2015   AST 36 11/21/2015   NA 133* 11/21/2015   K 3.5 11/21/2015   CL 97* 11/21/2015   CREATININE 1.11* 11/21/2015   BUN 11 11/21/2015   CO2 27 11/21/2015   TSH 2.764 11/22/2015   INR 1.12 11/21/2015   HGBA1C 6.0* 11/22/2015    Ct Angio Head W/cm &/or Wo Cm  11/21/2015  CLINICAL DATA:  Slurred speech, expressive aphasia, facial droop, unable to use RIGHT hand for 2 days. Recent carotid endarterectomy. EXAM: CT ANGIOGRAPHY HEAD AND NECK TECHNIQUE: Multidetector CT imaging of the head and neck was performed using the standard protocol during bolus administration of intravenous contrast. Multiplanar CT image reconstructions and MIPs were obtained to evaluate the vascular anatomy. Carotid stenosis measurements (when applicable) are obtained utilizing NASCET criteria, using the distal internal carotid diameter as the denominator. CONTRAST:  Isovue 370, 50 mL. COMPARISON:  CTA neck 10/09/2015.  CT head 11/21/2015. FINDINGS: CT HEAD Calvarium and skull base: No fracture or destructive lesion. Mastoids and middle ears are grossly clear. Paranasal sinuses: Imaged portions are clear. Orbits: Negative. Brain: No evidence of acute abnormality, including acute infarct, hemorrhage, hydrocephalus, or mass lesion. CTA NECK Aortic arch: Standard branching. Imaged portion shows no evidence of aneurysm or dissection. No significant  stenosis of the major arch vessel origins. Extensive atheromatous plaque in the aortic arch and descending thoracic aorta resulting in luminal narrowing. No visible dissection. Right carotid system: 50% stenosis RIGHT ICA origin. Moderately calcified plaque. No evidence of dissection, flow-limiting stenosis, or occlusion. Left carotid system: Widely patent status post endarterectomy. No complicating features. No evidence of dissection, stenosis (50% or greater) or occlusion. Vertebral arteries: RIGHT dominant. Both patent. Calcific plaque at both origins not flow reducing. No evidence of dissection, stenosis (50% or greater) or occlusion. Nonvascular soft tissues: COPD. Advanced facet arthropathy. Moderate disc degeneration. Heterogeneous thyroid. RIGHT lower pole thyroid nodule again noted measuring 11 mm CTA HEAD Anterior circulation: Dolichoectatic and asymmetrically enlarged LEFT cervical ICA. Atherosclerotic type aneurysm involving the LEFT cavernous and supraclinoid ICA measuring 10 x 10 x 13 mm. No associated thrombus or luminal narrowing. Mild cavernous vascular calcification on the RIGHT. No M1 stenosis. No MCA branch occlusion. Posterior circulation: RIGHT vertebral dominant. Basilar artery widely patent. Dolichoectasia RIGHT vertebral No significant stenosis, proximal occlusion, aneurysm, or vascular malformation. Venous sinuses: As permitted by contrast timing, patent. Anatomic variants: None of significance. Delayed phase:   No abnormal intracranial enhancement.  IMPRESSION: No visible complication related to recent LEFT carotid endarterectomy. No flow reducing lesion is evident. Endarterectomy is widely patent. Atherosclerotic type aneurysmal enlargement in the cavernous and supraclinoid ICA on the LEFT, 10 x 10 x 13 mm, without internal thrombus or narrowing of the parent vessel. No acute intracranial findings are evident. However, MRI is more sensitive in the detection of acute infarction.  Electronically Signed   By: Staci Righter M.D.   On: 11/21/2015 18:03   Ct Head Wo Contrast  11/21/2015  CLINICAL DATA:  Aphasia, history of recent left endarterectomy. EXAM: CT HEAD WITHOUT CONTRAST TECHNIQUE: Contiguous axial images were obtained from the base of the skull through the vertex without intravenous contrast. COMPARISON:  10/09/2015 FINDINGS: Bony calvarium is intact. No gross soft tissue abnormality is noted. No findings to suggest acute hemorrhage, acute infarction or space-occupying mass lesion are noted. IMPRESSION: No acute abnormality noted. Electronically Signed   By: Inez Catalina M.D.   On: 11/21/2015 12:54   Ct Angio Neck W/cm &/or Wo/cm  11/21/2015  CLINICAL DATA:  Slurred speech, expressive aphasia, facial droop, unable to use RIGHT hand for 2 days. Recent carotid endarterectomy. EXAM: CT ANGIOGRAPHY HEAD AND NECK TECHNIQUE: Multidetector CT imaging of the head and neck was performed using the standard protocol during bolus administration of intravenous contrast. Multiplanar CT image reconstructions and MIPs were obtained to evaluate the vascular anatomy. Carotid stenosis measurements (when applicable) are obtained utilizing NASCET criteria, using the distal internal carotid diameter as the denominator. CONTRAST:  Isovue 370, 50 mL. COMPARISON:  CTA neck 10/09/2015.  CT head 11/21/2015. FINDINGS: CT HEAD Calvarium and skull base: No fracture or destructive lesion. Mastoids and middle ears are grossly clear. Paranasal sinuses: Imaged portions are clear. Orbits: Negative. Brain: No evidence of acute abnormality, including acute infarct, hemorrhage, hydrocephalus, or mass lesion. CTA NECK Aortic arch: Standard branching. Imaged portion shows no evidence of aneurysm or dissection. No significant stenosis of the major arch vessel origins. Extensive atheromatous plaque in the aortic arch and descending thoracic aorta resulting in luminal narrowing. No visible dissection. Right carotid  system: 50% stenosis RIGHT ICA origin. Moderately calcified plaque. No evidence of dissection, flow-limiting stenosis, or occlusion. Left carotid system: Widely patent status post endarterectomy. No complicating features. No evidence of dissection, stenosis (50% or greater) or occlusion. Vertebral arteries: RIGHT dominant. Both patent. Calcific plaque at both origins not flow reducing. No evidence of dissection, stenosis (50% or greater) or occlusion. Nonvascular soft tissues: COPD. Advanced facet arthropathy. Moderate disc degeneration. Heterogeneous thyroid. RIGHT lower pole thyroid nodule again noted measuring 11 mm CTA HEAD Anterior circulation: Dolichoectatic and asymmetrically enlarged LEFT cervical ICA. Atherosclerotic type aneurysm involving the LEFT cavernous and supraclinoid ICA measuring 10 x 10 x 13 mm. No associated thrombus or luminal narrowing. Mild cavernous vascular calcification on the RIGHT. No M1 stenosis. No MCA branch occlusion. Posterior circulation: RIGHT vertebral dominant. Basilar artery widely patent. Dolichoectasia RIGHT vertebral No significant stenosis, proximal occlusion, aneurysm, or vascular malformation. Venous sinuses: As permitted by contrast timing, patent. Anatomic variants: None of significance. Delayed phase:   No abnormal intracranial enhancement. IMPRESSION: No visible complication related to recent LEFT carotid endarterectomy. No flow reducing lesion is evident. Endarterectomy is widely patent. Atherosclerotic type aneurysmal enlargement in the cavernous and supraclinoid ICA on the LEFT, 10 x 10 x 13 mm, without internal thrombus or narrowing of the parent vessel. No acute intracranial findings are evident. However, MRI is more sensitive in the detection of acute infarction. Electronically  Signed   By: Staci Righter M.D.   On: 11/21/2015 18:03   Mr Brain Wo Contrast  11/21/2015  CLINICAL DATA:  Transient slurred speech and word-finding difficulty. Recent LEFT carotid  endarterectomy. Currently has a non-focal neurologic exam. EXAM: MRI HEAD WITHOUT CONTRAST TECHNIQUE: Multiplanar, multiecho pulse sequences of the brain and surrounding structures were obtained without intravenous contrast. COMPARISON:  CTA head neck reported separately. FINDINGS: No evidence for acute infarction, hemorrhage, mass lesion, hydrocephalus, or extra-axial fluid. Global atrophy. Mild chronic microvascular ischemic change. No foci of chronic hemorrhage. Cervical spondylosis. Sphenoid sinus retention cyst. Asymmetric RIGHT mastoid effusion. Flow voids are maintained. Moderate dolichoectasia. IMPRESSION: No acute stroke is evident.  Chronic changes as described. Electronically Signed   By: Staci Righter M.D.   On: 11/21/2015 20:05    Assessment & Plan:   There are no diagnoses linked to this encounter. I am having Ms. Parmer maintain her ALPRAZolam, levothyroxine, amLODipine, acetaminophen, hydrALAZINE, docusate sodium, bisacodyl, aspirin EC, clopidogrel, and atorvastatin.  Meds ordered this encounter  Medications  . atorvastatin (LIPITOR) 80 MG tablet    Sig: Take 80 mg by mouth daily.     Follow-up: No Follow-up on file.  Walker Kehr, MD

## 2015-12-11 NOTE — Assessment & Plan Note (Signed)
Labs

## 2015-12-11 NOTE — Telephone Encounter (Signed)
[facility] [place] [facility] - Client [place] Call Center  Patient Name: [name]  DOB: Jun 29, 1925    Initial Comment Caller states, mother she was seen in the Dr office in [place]. She had a Chest Xray and blood work around 12-1230pm. She wants to know what the results are.    Nurse Assessment      Guidelines    Guideline Title Affirmed Question Affirmed Notes       Final Disposition User   Clinical Call Wayne Sever, RN, Tillie Rung    Comments  Angelene informed to call her daughter and not her. Left message on daughter's voice mail  Patient's daughter states she did get in touch with office directly and they are going to call her with results

## 2015-12-11 NOTE — Telephone Encounter (Signed)
Patient Name: [name]  DOB: [birth year]    Initial Comment Mother had carotid surgery on the May 8th, coughing, hacking, throwing up after drinking water. granddaughter is a Astronomer and recommended a Modified barium swallow and a chest xray. Not doing well, very weak and not eating    Nurse Assessment  Nurse: Orvan Seen, RN, Jacquilin Date/Time (Eastern Time): 12/11/2015 8:38:28 AM  Confirm and document reason for call. If symptomatic, describe symptoms. You must click the next button to save text entered. ---Caller states her mother had carotid surgery on the May 8th, coughing, hacking, throwing up from coughing after drinking water. granddaughter is a Astronomer and recommended a Modified barium swallow and a chest x-ray. Not doing well, very weak and not eating  Has the patient traveled out of the country within the last 30 days? ---No  Does the patient have any new or worsening symptoms? ---Yes  Will a triage be completed? ---Yes  Related visit to physician within the last 2 weeks? ---Yes  Does the PT have any chronic conditions? (i.e. diabetes, asthma, etc.) ---Yes  List chronic conditions. ---hypothyroid, stroke.  Is this a behavioral health or substance abuse call? ---No     Guidelines    Guideline Title Affirmed Question Affirmed Notes  Cough - Chronic SEVERE coughing spells (e.g., whooping sound after coughing, vomiting after coughing)    Final Disposition User   See Physician within 24 Hours Atkins, RN, Jacquilin    Comments  Advised caller i could get her in with her PCP at 4 pm today but caller wanted to know if a CXR could be done in the office? I called the backline and was advised they would be sent to [facility]. I advised caller of this. Caller requests to be seen at [facility] and also would like to see another MD besides her PCP. Scheduled caller with Dr. Alain Marion at [phone]   Referrals  [facility]   Disagree/Comply: Comply

## 2015-12-11 NOTE — Assessment & Plan Note (Signed)
CXR, EKG, labs

## 2015-12-11 NOTE — Telephone Encounter (Signed)
Please advise on Prilosec and Plavix ineffectiveness.

## 2015-12-11 NOTE — Assessment & Plan Note (Signed)
On Hydralazine, Norvasc

## 2015-12-11 NOTE — Assessment & Plan Note (Addendum)
Labs, EKG, CXR

## 2015-12-11 NOTE — Assessment & Plan Note (Signed)
?  etiology CXR, EKG, labs

## 2015-12-11 NOTE — Patient Instructions (Signed)
Go to ER if worse 

## 2015-12-11 NOTE — Telephone Encounter (Signed)
Discontinue Prilosec and substitute Protonix

## 2015-12-11 NOTE — Telephone Encounter (Signed)
Spoke with pt's son - gave appt info: Modified Barium Swallow 12/15/15 10:45 am at [facility]. ST. EVAL - 12/15/15 2pm [facility] [address]  [phone] Son verbalized understanding.

## 2015-12-11 NOTE — Progress Notes (Signed)
Pre visit review using our clinic review tool, if applicable. No additional management support is needed unless otherwise documented below in the visit note. 

## 2015-12-11 NOTE — Telephone Encounter (Signed)
Noted  

## 2015-12-12 ENCOUNTER — Telehealth: Payer: Self-pay | Admitting: Vascular Surgery

## 2015-12-12 ENCOUNTER — Telehealth: Payer: Self-pay | Admitting: Cardiovascular Disease

## 2015-12-12 ENCOUNTER — Telehealth: Payer: Self-pay | Admitting: Internal Medicine

## 2015-12-12 MED ORDER — PANTOPRAZOLE SODIUM 40 MG PO TBEC: 40.0000 mg | DELAYED_RELEASE_TABLET | Freq: Every day | ORAL | Status: DC

## 2015-12-12 NOTE — Telephone Encounter (Signed)
See 12/11/15 OV note with Dr. Alain Marion. He advised pt to f/u with PCP in 2 weeks. No further appoointments needed at [facility].

## 2015-12-12 NOTE — Telephone Encounter (Signed)
Pt had an post hospital appointment with Langston Masker PA on 12/14/15. Daughter Jose Persia states that pt was not feeling well, pt was seen by her PCP yesterday 12/11/15. The PCP did an EKG, checked her heart and lungs, did blood work. Daughter didn't think pt needed to be seen that soon. daughter states that if pt needs to be re-scheduled or if the needs to be seen soon to let her know.

## 2015-12-12 NOTE — Telephone Encounter (Signed)
pls see mychart message Thx

## 2015-12-12 NOTE — Telephone Encounter (Signed)
Spoke to [place], see other message.

## 2015-12-12 NOTE — Telephone Encounter (Signed)
NEw Message  Pt dtr called to cancel 6/8 EPH w. Mickel Baas- pt dtr wanted to discuss what to do going forward. Please call back and discuss.

## 2015-12-12 NOTE — Telephone Encounter (Signed)
Spoke to pt's daughter [name], told her to discontinue Prilosec and Dr.K wants her to take Protonix. Rx sent to pharmacy. [name] verbalized understanding.

## 2015-12-12 NOTE — Telephone Encounter (Signed)
Spoke with daughter [name] [id number] - Modified Barium Swallow rescheduled to 12/13/15 at 12:45 pm at [facility]. ST [phone] rescheduled for 12/13/15 at 3:30. [name] verbalized understanding.

## 2015-12-12 NOTE — Telephone Encounter (Signed)
[name] pt daughter called to say pt was seen by  Dr Alain Marion on yesterday 12/11/15 and he rx her omeprazole (PRILOSEC) 40 MG capsule   Pt daughter was told by pharmacy that this will interact with her plavix and daughter is asking if it will be ok for her to take omeprazole

## 2015-12-12 NOTE — Progress Notes (Signed)
Speech Pathology: Late entry from 11/22/15  11/22/15 1000  SLP Visit Information  SLP Received On 11/22/15  Subjective  Subjective pleasant, good historian  General Information  HPI 82 y.o. female underwent left CEA 11/13/15; admitted 5/16 with mild aphasia, RUE weakness, difficulty swallowing (clarified to mean "lump in the throat" per pt's description), mild marginal mandibular palsy. Hx TIA, left parietal CVA 2015.  MRI negative for acute stroke.   Type of Study Bedside Swallow Evaluation  Previous Swallow Assessment no  Diet Prior to this Study NPO  Temperature Spikes Noted No  Respiratory Status Room air  History of Recent Intubation No  Behavior/Cognition Alert;Cooperative;Pleasant mood  Oral Cavity Assessment WFL  Oral Care Completed by SLP No  Oral Cavity - Dentition Adequate natural dentition  Vision Functional for self-feeding  Self-Feeding Abilities Able to feed self  Patient Positioning Upright in bed  Baseline Vocal Quality Normal  Volitional Cough Strong  Volitional Swallow Able to elicit  Oral Motor/Sensory Function  Overall Oral Motor/Sensory Function Other (comment) (mild left labial asymmetry)  Ice Chips  Ice chips WFL  Presentation Spoon  Thin Liquid  Thin Liquid WFL (excluding occasional mild throat-clearing)  Presentation Cup  Nectar Thick Liquid  Nectar Thick Liquid NT  Honey Thick Liquid  Honey Thick Liquid NT  Puree  Puree WFL  Solid  Solid WFL  SLP - End of Session  Patient left in bed;with call bell/phone within reach;with family/visitor present  SLP Assessment  Clinical Impression Statement (ACUTE ONLY) Pt presents with functional overall swallow with adequate mastication, brisk swallow response, and occasional, mild throat-clearing after 3 oz water test.  Pt with mild increase in RR after successive bolus consumption.  Recommend initiating a heart healthy diet, thin liquids, meds whole in liquids.  SLP will f/ux1 to ensure safety/toleration of new  diet.  Pt/family agree.   Impact on safety and function No limitations  Other Related Risk Factors Previous CVA  Swallow Evaluation Recommendations  SLP Diet Recommendations Thin;Age appropriate regular  Liquid Administration via Cup;Straw  Medication Administration Whole meds with liquid  Supervision Patient able to self feed  Postural Changes Seated upright at 90 degrees  Treatment Plan  Oral Care Recommendations Oral care BID  Treatment Recommendations Therapy as outlined in treatment plan below  Follow up Recommendations None  Speech Therapy Frequency (ACUTE ONLY) min 1 x/week  Treatment Duration 1 week  Interventions Patient/family education;Diet toleration management by SLP  Individuals Consulted  Consulted and Agree with Results and Recommendations Patient  SLP Time Calculation  SLP Start Time (ACUTE ONLY) 1001  SLP Stop Time (ACUTE ONLY) 1026  SLP Time Calculation (min) (ACUTE ONLY) 25 min  SLP G-Codes **NOT FOR INPATIENT CLASS**  Functional Assessment Tool Used clinical judgment  Functional Limitations Swallowing  Swallow Current Status [id number]) CI  Swallow Goal Status [id number]) CI  Swallow Discharge Status [id number]) CI  SLP Evaluations  $ SLP Speech Visit 1 Procedure  SLP Evaluations  $BSS Swallow 1 Procedure

## 2015-12-13 ENCOUNTER — Encounter: Payer: Self-pay | Admitting: Internal Medicine

## 2015-12-13 ENCOUNTER — Ambulatory Visit: Payer: Medicare Other

## 2015-12-13 ENCOUNTER — Ambulatory Visit (HOSPITAL_COMMUNITY)
Admission: RE | Admit: 2015-12-13 | Discharge: 2015-12-13 | Disposition: A | Discharge status: Home / Self Care | Payer: Medicare Other | Source: Ambulatory Visit | Attending: Vascular Surgery | Admitting: Vascular Surgery

## 2015-12-13 ENCOUNTER — Telehealth: Payer: Self-pay | Admitting: Vascular Surgery

## 2015-12-13 ENCOUNTER — Telehealth: Payer: Self-pay | Admitting: Internal Medicine

## 2015-12-13 DIAGNOSIS — R1310 Dysphagia, unspecified: Secondary | ICD-10-CM | POA: Insufficient documentation

## 2015-12-13 NOTE — Telephone Encounter (Signed)
Spoke to pt's daughter [name], told her Dr.K is out till Friday. Went over results again from Dr. Alain Marion regarding chest x-ray and labs. Asked if she got antibiotic? [name] said yes, but pt is still coughing in the morning and gagging.Told her no milk products for pt and can try OTC Robitussin at night and see if that helps. [name] said they were told to thicken her liquids. Told her yes that will help to until she has the swallow test done to see what is going on. Told her also need to make follow up appt next week with Dr.K. [name] verbalized understanding.

## 2015-12-13 NOTE — Telephone Encounter (Signed)
Called pt daughter [name], told her again Dr.K will not be back till Friday. Also told her can ask the pharmacist what cough medicine she can take OTC that will not affect her blood pressure. [name] verbalized understanding and asked if she could have the number of an ENT. Gave her the number for [facility] but told her she made need a referral. If she does just call me back. [name] verbalized understanding.

## 2015-12-13 NOTE — Telephone Encounter (Signed)
Daughter would like to know about the test results (Ekg and chest xray) and can anything be done about the horrible gagging other than more pills.

## 2015-12-13 NOTE — Telephone Encounter (Signed)
Per [facility], due to results of swallow study, S.T. Has been cancelled by them. They wanted an ENT evaluation scheduled, which Selinda Eon has arranged.

## 2015-12-14 ENCOUNTER — Ambulatory Visit: Payer: Medicare Other | Admitting: Cardiology

## 2015-12-14 ENCOUNTER — Other Ambulatory Visit: Payer: Self-pay | Admitting: Physician Assistant

## 2015-12-14 ENCOUNTER — Telehealth: Payer: Self-pay

## 2015-12-14 NOTE — Telephone Encounter (Signed)
Please see my message and advise.

## 2015-12-14 NOTE — Telephone Encounter (Signed)
Crystal from [facility] called wanting to know if pt should continue using Plavix because she had been prescribed omeprazole. Pharmacist was made aware that a Rx for Protonix had been prescribed on 6/6 and was probably to replace the omeprazole. Advised pharmacist to call PCP to clarify.

## 2015-12-14 NOTE — Telephone Encounter (Signed)
Please see message and advise any further instructions.

## 2015-12-15 ENCOUNTER — Inpatient Hospital Stay (HOSPITAL_COMMUNITY): Admission: RE | Admit: 2015-12-15 | Payer: Medicare Other | Source: Ambulatory Visit

## 2015-12-15 ENCOUNTER — Ambulatory Visit: Payer: Medicare PPO | Admitting: Internal Medicine

## 2015-12-15 ENCOUNTER — Ambulatory Visit: Payer: Medicare Other | Admitting: Internal Medicine

## 2015-12-15 ENCOUNTER — Ambulatory Visit: Payer: Medicare Other

## 2015-12-15 ENCOUNTER — Ambulatory Visit (HOSPITAL_COMMUNITY): Payer: Medicare Other

## 2015-12-18 ENCOUNTER — Ambulatory Visit: Payer: Medicare Other

## 2015-12-25 ENCOUNTER — Ambulatory Visit (INDEPENDENT_AMBULATORY_CARE_PROVIDER_SITE_OTHER): Payer: Medicare Other | Admitting: Internal Medicine

## 2015-12-25 ENCOUNTER — Encounter: Payer: Self-pay | Admitting: Internal Medicine

## 2015-12-25 VITALS — BP 150/72 | HR 88 | Temp 98.1°F | Resp 20 | Ht 66.0 in | Wt 142.0 lb

## 2015-12-25 DIAGNOSIS — Z8669 Personal history of other diseases of the nervous system and sense organs: Secondary | ICD-10-CM

## 2015-12-25 DIAGNOSIS — I700 Atherosclerosis of aorta: Secondary | ICD-10-CM

## 2015-12-25 DIAGNOSIS — R490 Dysphonia: Secondary | ICD-10-CM

## 2015-12-25 DIAGNOSIS — I10 Essential (primary) hypertension: Secondary | ICD-10-CM

## 2015-12-25 DIAGNOSIS — R531 Weakness: Secondary | ICD-10-CM

## 2015-12-25 DIAGNOSIS — Z8673 Personal history of transient ischemic attack (TIA), and cerebral infarction without residual deficits: Secondary | ICD-10-CM

## 2015-12-25 NOTE — Patient Instructions (Signed)
Limit your sodium (Salt) intake  ENT follow-up as discussed  Return in one month for follow-up

## 2015-12-25 NOTE — Progress Notes (Signed)
Subjective:    Patient ID: [name], female    DOB: 01-31-1925, 82 y.o.   MRN: [id number]  HPI  82 year old patient who has essential hypertension.  She is recovering from a left carotid endarterectomy on May 8.  She was readmitted to the hospital with aphasia on May 16.  Acute stroke workup was negative.  There was some concerns about a possible embolic event secondary to extensive aortic arch atherosclerosis.  The patient remains on aspirin, Plavix and is scheduled to see neurology next month. She remains weak, but seems to be improving.  She continues to have some difficulties with speech because production, shortness of breath.  She has been working with speech pathology and has had a negative barium swallow.  The patient is on a thickened liquid diet.  The patient is quite bothered by her residual speech impairment and is requesting ENT referral as suggested by speech pathology.  Past Medical History  Diagnosis Date  . [place] DISEASE, LUMBAR 12/16/2008  . DIVERTICULOSIS, COLON 09/30/2008  . DYSPNEA 07/13/2008  . HYPERLIPIDEMIA 03/06/2007  . HYPERTENSION 03/06/2007  . HYPOTHYROIDISM 10/13/2007  . OSTEOARTHRITIS 03/06/2007  . Personal history of colonic polyps 09/30/2008  . TOBACCO USE, QUIT 04/12/2009  . WEAKNESS 11/09/2007  . Graves disease   . Aortic arch atherosclerosis ([place]) 06/24/2014  . Atherosclerotic ulcer of aorta ([place]) 06/24/2014  . History of embolic stroke [id number]    Left brain  . Stroke ([place])     06/2014           . Anxiety      Social History   Social History  . Marital Status: Divorced    Spouse Name: N/A  . Number of Children: 4  . Years of Education: college   Occupational History  . Not on file.   Social History Main Topics  . Smoking status: Former Smoker    Quit date: 07/08/1978  . Smokeless tobacco: Never Used  . Alcohol Use: 4.2 oz/week    7 Glasses of wine per week     Comment: "red wine"  . Drug Use: No  . Sexual Activity: Not on file   Other  Topics Concern  . Not on file   Social History Narrative   Patient is right handed.   Patient drinks 1 cup caffeine daily.    Past Surgical History  Procedure Laterality Date  . Cataract extraction    . Knee surgery    . Cholecystectomy    . Cardiac catheterization    . Tee without cardioversion N/A 06/24/2014    Procedure: TRANSESOPHAGEAL ECHOCARDIOGRAM (TEE);  Surgeon: Sanda Klein, MD;  Location: [facility] ENDOSCOPY;  Service: Cardiovascular;  Laterality: N/A;  . Endarterectomy Left 11/13/2015    Procedure: LEFT CAROTID ARTERY ENDARTERECTOMY;  Surgeon: Conrad Waymart, MD;  Location: [facility];  Service: Vascular;  Laterality: Left;  . Patch angioplasty Left 11/13/2015    Procedure: WITH 1CM X 6CM  XENOSURE BIOLOGIC PATCH ANGIOPLASTY;  Surgeon: Conrad Snohomish, MD;  Location: [facility] OR;  Service: Vascular;  Laterality: Left;    Family History  Problem Relation Age of Onset  . Cancer Maternal Grandmother     stomach    Allergies  Allergen Reactions  . Catapres [Clonidine Hcl] Other (See Comments)    Made pt feel horrible, shaky, weak and nausea  . Lisinopril Anaphylaxis    Tongue swelling  . Labetalol Hcl Other (See Comments)    Caused bradycardia and syncope    Current Outpatient  Prescriptions on File Prior to Visit  Medication Sig Dispense Refill  . acetaminophen (TYLENOL) 325 MG tablet Take 1 tablet (325 mg total) by mouth every 6 (six) hours as needed.    . ALPRAZolam (XANAX) 0.5 MG tablet Take 1 tablet (0.5 mg total) by mouth daily as needed for anxiety. 30 tablet 1  . amLODipine (NORVASC) 10 MG tablet Take 1 tablet (10 mg total) by mouth daily. 30 tablet 11  . aspirin EC 81 MG tablet Take 1 tablet (81 mg total) by mouth daily. 30 tablet 0  . atorvastatin (LIPITOR) 80 MG tablet Take 80 mg by mouth daily.    . bisacodyl (DULCOLAX) 5 MG EC tablet Take 10 mg by mouth daily as needed for moderate constipation.    . clopidogrel (PLAVIX) 75 MG tablet TAKE 1 TABLET BY MOUTH EVERY DAY 30 tablet  10  . docusate sodium (COLACE) 100 MG capsule Take 100 mg by mouth 2 (two) times daily as needed. Reported on 12/08/2015    . hydrALAZINE (APRESOLINE) 50 MG tablet Take 1.5 tablets (75 mg total) by mouth 3 (three) times daily. 135 tablet 3  . levothyroxine (SYNTHROID, LEVOTHROID) 75 MCG tablet TAKE 1 TABLET BY MOUTH EVERY DAY 90 tablet 1  . ondansetron (ZOFRAN) 4 MG tablet Take 1 tablet (4 mg total) by mouth every 8 (eight) hours as needed for nausea or vomiting. 20 tablet 0  . pantoprazole (PROTONIX) 40 MG tablet Take 1 tablet (40 mg total) by mouth daily. 30 tablet 5   No current facility-administered medications on file prior to visit.    BP 150/72 mmHg  Pulse 88  Temp(Src) 98.1 F (36.7 C) (Oral)  Resp 20  Ht 5\' 6"  (1.676 m)  Wt 142 lb (64.411 kg)  BMI 22.93 kg/m2  SpO2 97%     Review of Systems  HENT: Positive for rhinorrhea. Negative for congestion, dental problem, hearing loss, sinus pressure, sore throat and tinnitus.   Eyes: Negative for pain, discharge and visual disturbance.  Respiratory: Positive for cough. Negative for shortness of breath.   Cardiovascular: Negative for chest pain, palpitations and leg swelling.  Gastrointestinal: Negative for nausea, vomiting, abdominal pain, diarrhea, constipation, blood in stool and abdominal distention.  Genitourinary: Negative for dysuria, urgency, frequency, hematuria, flank pain, vaginal bleeding, vaginal discharge, difficulty urinating, vaginal pain and pelvic pain.  Musculoskeletal: Negative for joint swelling, arthralgias and gait problem.  Skin: Negative for rash.  Neurological: Positive for speech difficulty and weakness. Negative for dizziness, syncope, numbness and headaches.  Hematological: Negative for adenopathy.  Psychiatric/Behavioral: Negative for behavioral problems, dysphoric mood and agitation. The patient is not nervous/anxious.        Objective:   Physical Exam  Constitutional: She is oriented to person,  place, and time. She appears well-developed and well-nourished.  Blood pressure 140/70 both arms Alert No distress Slightly hoarse  HENT:  Head: Normocephalic.  Right Ear: External ear normal.  Left Ear: External ear normal.  Mouth/Throat: Oropharynx is clear and moist.  Slight asymmetry of the soft palate  Eyes: Conjunctivae and EOM are normal. Pupils are equal, round, and reactive to light.  Neck: Normal range of motion. Neck supple. No thyromegaly present.  Well-healed left endarterectomy scar  Cardiovascular: Normal rate, regular rhythm, normal heart sounds and intact distal pulses.   Pulmonary/Chest: Effort normal and breath sounds normal.  Abdominal: Soft. Bowel sounds are normal. She exhibits no mass. There is no tenderness.  Musculoskeletal: Normal range of motion.  Lymphadenopathy:  She has no cervical adenopathy.  Neurological: She is alert and oriented to person, place, and time.  Skin: Skin is warm and dry. No rash noted.  Psychiatric: She has a normal mood and affect. Her behavior is normal.          Assessment & Plan:   Status post left CEA Status post embolic stroke Mild residual dysphasia.  Suspect this is at least aggravated by postnasal drip and cough.  Patient and family desire ENT evaluation.  Will refer to Dr. Lucia Gaskins, who patient has seen in the past Essential hypertension, stable.  Nom cramping  Nyoka Cowden, MD

## 2015-12-25 NOTE — Progress Notes (Signed)
Pre visit review using our clinic review tool, if applicable. No additional management support is needed unless otherwise documented below in the visit note. 

## 2015-12-28 ENCOUNTER — Encounter: Payer: Self-pay | Admitting: Internal Medicine

## 2015-12-29 ENCOUNTER — Encounter: Payer: Self-pay | Admitting: Vascular Surgery

## 2015-12-29 ENCOUNTER — Telehealth: Payer: Self-pay | Admitting: *Deleted

## 2015-12-29 NOTE — Telephone Encounter (Signed)
[name] came by the office and picked up completed forms. Copy made.

## 2015-12-29 NOTE — Telephone Encounter (Signed)
Spoke to B[place] , told her I have the forms ready for her that Dr.K filled out. Do you want me to fax them or will you pick them up? [name] said she will come by at lunch to pickup. Told her okay just ask for me. [name] verbalized understanding.

## 2016-01-03 NOTE — Progress Notes (Signed)
Postoperative Visit   History of Present Illness  [name] is a 82 y.o. (Oct 05, 1924) female who presents for postoperative follow-up for: L CEA (Date: 11/13/15). The patient's neck incision is healed. The patient has had possible TIA symptoms. Pt came back to ED on 11/21/15 with reported aphasia. Work-up was negative for acute stroke. Both neurology and I had concerns this patient had a thromboembolism from her extensive aortic arch atherosclerosis. She was reluctant to start anticoagulation and elected to take ASA and Plavix instead.   At this point, her sx are: altered voice.  She saw ENT who noted on nasopharynoscopy some Left vocal cord dysmotility.  Choking has resolved at this point and her PO intake is normal.    Current Outpatient Prescriptions  Medication Sig Dispense Refill  . acetaminophen (TYLENOL) 325 MG tablet Take 1 tablet (325 mg total) by mouth every 6 (six) hours as needed.    . ALPRAZolam (XANAX) 0.5 MG tablet Take 1 tablet (0.5 mg total) by mouth daily as needed for anxiety. 30 tablet 1  . amLODipine (NORVASC) 10 MG tablet Take 1 tablet (10 mg total) by mouth daily. 30 tablet 11  . aspirin EC 81 MG tablet Take 1 tablet (81 mg total) by mouth daily. 30 tablet 0  . atorvastatin (LIPITOR) 80 MG tablet Take 80 mg by mouth daily.    . bisacodyl (DULCOLAX) 5 MG EC tablet Take 10 mg by mouth daily as needed for moderate constipation.    . clopidogrel (PLAVIX) 75 MG tablet TAKE 1 TABLET BY MOUTH EVERY DAY 30 tablet 10  . docusate sodium (COLACE) 100 MG capsule Take 100 mg by mouth 2 (two) times daily as needed. Reported on 12/08/2015    . hydrALAZINE (APRESOLINE) 50 MG tablet Take 1.5 tablets (75 mg total) by mouth 3 (three) times daily. 135 tablet 3  . levothyroxine (SYNTHROID, LEVOTHROID) 75 MCG tablet TAKE 1 TABLET BY MOUTH EVERY DAY 90 tablet 1  . ondansetron (ZOFRAN) 4 MG tablet Take 1 tablet (4 mg total) by mouth every 8 (eight) hours as needed for nausea or  vomiting. 20 tablet 0  . pantoprazole (PROTONIX) 40 MG tablet Take 1 tablet (40 mg total) by mouth daily. 30 tablet 5   No current facility-administered medications for this visit.    Physical Examination  Filed Vitals:   01/05/16 0916 01/05/16 0918  BP: 169/84 162/82  Pulse: 88 80  Temp:  97.5 F (36.4 C)  TempSrc:  Oral  Resp: 22   Height:  5\' 6"  (1.676 m)  Weight:  138 lb (62.596 kg)  SpO2:  97%    GEN: hoarse voice L Neck: Incision is healed, Throat: Minimal erythema Neuro: CN 2-12 are intact , Motor strength is 5/5 bilaterally, sensation is grossly intact   Medical Decision Making  [name] is a 82 y.o. 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  Pt continues to be reluctant to consider anticoagulation.  Will continue with Plavix+ASA.  Pt did not have any change in voice on POD #1 or immediately postop suggesting a neuropraxia rather than transection of the RLN or Vagus nerve.  Usually these resolve with time.  The patient has follow up with ENT in the next few months.  Would repeat B carotid duplex in 9 month to reset the baseline for surveillance on this patient.  Will see  her at that time.  Thank you for allowing [place] to participate in this patient's care.  Adele Barthel, MD, FACS [facility] Office: [phone] Pager: [phone]

## 2016-01-05 ENCOUNTER — Encounter: Payer: Self-pay | Admitting: Vascular Surgery

## 2016-01-05 ENCOUNTER — Ambulatory Visit (INDEPENDENT_AMBULATORY_CARE_PROVIDER_SITE_OTHER): Payer: Medicare Other | Admitting: Vascular Surgery

## 2016-01-05 VITALS — BP 162/82 | HR 80 | Temp 97.5°F | Resp 22 | Ht 66.0 in | Wt 138.0 lb

## 2016-01-05 DIAGNOSIS — I739 Peripheral vascular disease, unspecified: Principal | ICD-10-CM

## 2016-01-05 DIAGNOSIS — I779 Disorder of arteries and arterioles, unspecified: Secondary | ICD-10-CM

## 2016-01-11 ENCOUNTER — Encounter: Payer: Self-pay | Admitting: Internal Medicine

## 2016-01-12 ENCOUNTER — Ambulatory Visit (INDEPENDENT_AMBULATORY_CARE_PROVIDER_SITE_OTHER): Payer: Medicare Other | Admitting: Internal Medicine

## 2016-01-12 ENCOUNTER — Encounter: Payer: Self-pay | Admitting: Internal Medicine

## 2016-01-12 VITALS — BP 160/90 | HR 89 | Temp 97.9°F | Resp 22 | Ht 66.0 in | Wt 140.0 lb

## 2016-01-12 DIAGNOSIS — R911 Solitary pulmonary nodule: Secondary | ICD-10-CM | POA: Diagnosis not present

## 2016-01-12 DIAGNOSIS — I10 Essential (primary) hypertension: Secondary | ICD-10-CM | POA: Diagnosis not present

## 2016-01-12 DIAGNOSIS — R0602 Shortness of breath: Secondary | ICD-10-CM

## 2016-01-12 DIAGNOSIS — R112 Nausea with vomiting, unspecified: Secondary | ICD-10-CM | POA: Diagnosis not present

## 2016-01-12 MED ORDER — FLUTICASONE-SALMETEROL 250-50 MCG/DOSE IN AEPB: 1.0000 | INHALATION_SPRAY | Freq: Two times a day (BID) | RESPIRATORY_TRACT | Status: DC

## 2016-01-12 NOTE — Patient Instructions (Signed)
HOME CARE INSTRUCTIONS  Drink plenty of water. Water helps thin the mucus so your sinuses can drain more easily.  Use a humidifier.  Inhale steam 3-4 times a day (for example, sit in the bathroom with the shower running).  Apply a warm, moist washcloth to your face 3-4 times a day, or as directed by your health care provider.  Use saline nasal sprays to help moisten and clean your sinuses.  Take medicines only as directed by your health care provider.     Advair used twice daily

## 2016-01-12 NOTE — Progress Notes (Signed)
Pre visit review using our clinic review tool, if applicable. No additional management support is needed unless otherwise documented below in the visit note. 

## 2016-01-12 NOTE — Progress Notes (Signed)
Subjective:    Patient ID: [name], female    DOB: 1924/09/25, 82 y.o.   MRN: [id number]  Shortness of Breath Associated symptoms include rhinorrhea, vomiting and wheezing. Pertinent negatives include no abdominal pain, fever or sore throat.  Cough Associated symptoms include postnasal drip, rhinorrhea, shortness of breath and wheezing. Pertinent negatives include no chills, fever or sore throat.     82 year old patient who is seen today in follow-up. She has had complications following a fairly recent surgery for carotid artery stenosis. At the present time, she continues to have  Rhinorrhea and also some sputum production. This causes paroxysms of  coughing associated with shortness of breath  And episodes of vomiting.  She has been using guaifenesin as well as fluticasone nasal spray with some benefit.  She also describes some chest congestion and intermittent wheezing.  X-rays last month revealed hyperinflation as well as chronic bronchitic changes in addition to a pulmonary nodule which will need evaluation in the future.  Cough is largely nonproductive  She has had a swallowing study that has been normal.  She remains hoarse and has been of value by ENT and diagnosed with left vocal cord paralysis following her left CEA.  This is expected to improve over the next several weeks or months  She used her daughter's albuterol MDI on a few occasions with benefit    Past Medical History  Diagnosis Date  . [place] DISEASE, LUMBAR 12/16/2008  . DIVERTICULOSIS, COLON 09/30/2008  . DYSPNEA 07/13/2008  . HYPERLIPIDEMIA 03/06/2007  . HYPERTENSION 03/06/2007  . HYPOTHYROIDISM 10/13/2007  . OSTEOARTHRITIS 03/06/2007  . Personal history of colonic polyps 09/30/2008  . TOBACCO USE, QUIT 04/12/2009  . WEAKNESS 11/09/2007  . Graves disease   . Aortic arch atherosclerosis ([place]) 06/24/2014  . Atherosclerotic ulcer of aorta ([place]) 06/24/2014  . History of embolic stroke [id number]    Left brain  . Stroke  ([place])     06/2014           . Anxiety      Social History   Social History  . Marital Status: Divorced    Spouse Name: N/A  . Number of Children: 4  . Years of Education: college   Occupational History  . Not on file.   Social History Main Topics  . Smoking status: Former Smoker    Quit date: 07/08/1978  . Smokeless tobacco: Never Used  . Alcohol Use: 4.2 oz/week    7 Glasses of wine per week     Comment: "red wine"  . Drug Use: No  . Sexual Activity: Not on file   Other Topics Concern  . Not on file   Social History Narrative   Patient is right handed.   Patient drinks 1 cup caffeine daily.    Past Surgical History  Procedure Laterality Date  . Cataract extraction    . Knee surgery    . Cholecystectomy    . Cardiac catheterization    . Tee without cardioversion N/A 06/24/2014    Procedure: TRANSESOPHAGEAL ECHOCARDIOGRAM (TEE);  Surgeon: Sanda Klein, MD;  Location: [facility] ENDOSCOPY;  Service: Cardiovascular;  Laterality: N/A;  . Endarterectomy Left 11/13/2015    Procedure: LEFT CAROTID ARTERY ENDARTERECTOMY;  Surgeon: Conrad Tangier, MD;  Location: [place];  Service: Vascular;  Laterality: Left;  . Patch angioplasty Left 11/13/2015    Procedure: WITH 1CM X 6CM  XENOSURE BIOLOGIC PATCH ANGIOPLASTY;  Surgeon: Conrad Benton, MD;  Location: [place];  Service: Vascular;  Laterality: Left;    Family History  Problem Relation Age of Onset  . Cancer Maternal Grandmother     stomach    Allergies  Allergen Reactions  . Catapres [Clonidine Hcl] Other (See Comments)    Made pt feel horrible, shaky, weak and nausea  . Lisinopril Anaphylaxis    Tongue swelling  . Labetalol Hcl Other (See Comments)    Caused bradycardia and syncope    Current Outpatient Prescriptions on File Prior to Visit  Medication Sig Dispense Refill  . ALPRAZolam (XANAX) 0.5 MG tablet Take 1 tablet (0.5 mg total) by mouth daily as needed for anxiety. 30 tablet 1  . amLODipine (NORVASC) 10 MG tablet Take 1  tablet (10 mg total) by mouth daily. 30 tablet 11  . aspirin EC 81 MG tablet Take 1 tablet (81 mg total) by mouth daily. 30 tablet 0  . atorvastatin (LIPITOR) 80 MG tablet Take 80 mg by mouth daily.    . bisacodyl (DULCOLAX) 5 MG EC tablet Take 10 mg by mouth daily as needed for moderate constipation.    . clopidogrel (PLAVIX) 75 MG tablet TAKE 1 TABLET BY MOUTH EVERY DAY 30 tablet 10  . dextromethorphan-guaiFENesin (MUCINEX DM) 30-600 MG 12hr tablet Take 1 tablet by mouth 2 (two) times daily as needed for cough.    . docusate sodium (COLACE) 100 MG capsule Take 100 mg by mouth 2 (two) times daily as needed. Reported on 12/08/2015    . fluticasone (FLONASE) 50 MCG/ACT nasal spray Place 2 sprays into both nostrils daily.    . hydrALAZINE (APRESOLINE) 50 MG tablet Take 1.5 tablets (75 mg total) by mouth 3 (three) times daily. 135 tablet 3  . levothyroxine (SYNTHROID, LEVOTHROID) 75 MCG tablet TAKE 1 TABLET BY MOUTH EVERY DAY 90 tablet 1  . ondansetron (ZOFRAN) 4 MG tablet Take 1 tablet (4 mg total) by mouth every 8 (eight) hours as needed for nausea or vomiting. 20 tablet 0  . pantoprazole (PROTONIX) 40 MG tablet Take 1 tablet (40 mg total) by mouth daily. 30 tablet 5   No current facility-administered medications on file prior to visit.    BP 160/90 mmHg  Pulse 89  Temp(Src) 97.9 F (36.6 C) (Oral)  Resp 22  Ht 5\' 6"  (1.676 m)  Wt 140 lb (63.504 kg)  BMI 22.61 kg/m2  SpO2 98%     Review of Systems  Constitutional: Positive for activity change, appetite change and fatigue. Negative for fever, chills and diaphoresis.  HENT: Positive for congestion, postnasal drip, rhinorrhea, trouble swallowing and voice change. Negative for sore throat and tinnitus.   Respiratory: Positive for cough, choking, shortness of breath and wheezing.   Cardiovascular: Negative for palpitations.  Gastrointestinal: Positive for nausea and vomiting. Negative for abdominal pain and abdominal distention.         Objective:   Physical Exam  Constitutional: She is oriented to person, place, and time. She appears well-developed and well-nourished.  Alert, appropriate No distress Holding an emesis bag  Afebrile O2 saturation 97%  HENT:  Head: Normocephalic.  Right Ear: External ear normal.  Left Ear: External ear normal.  Mouth/Throat: Oropharynx is clear and moist.  Eyes: Conjunctivae and EOM are normal. Pupils are equal, round, and reactive to light.  Neck: Normal range of motion. Neck supple. No thyromegaly present.  Cardiovascular: Normal rate, regular rhythm, normal heart sounds and intact distal pulses.   Pulmonary/Chest: Effort normal. She has wheezes.  Decreased breath sounds Few scattered rhonchi Few faint  wheezing heard anteriorly  O2 saturation 97%  Abdominal: Soft. Bowel sounds are normal. She exhibits no mass. There is no tenderness.  Musculoskeletal: Normal range of motion.  Lymphadenopathy:    She has no cervical adenopathy.  Neurological: She is alert and oriented to person, place, and time.  Skin: Skin is warm and dry. No rash noted.  Psychiatric: She has a normal mood and affect. Her behavior is normal.          Assessment & Plan:   Chronic bronchitis Upper airway cough syndrome.  Will continue efforts at hydration and use of expectorants.  Will continue fluticasone nasal spray.  Will add Advair 250/50 twice a day.  Recheck 3 weeks Status post left CEA History of pulmonary nodule.  Will reevaluate in the future  Vomiting, shortness of breath associated with paroxysms of coughing.  We'll continue pulmonary toilet.  Patient does have incentive spirometer at home

## 2016-01-19 ENCOUNTER — Ambulatory Visit: Payer: Medicare Other | Admitting: Internal Medicine

## 2016-01-29 ENCOUNTER — Emergency Department (HOSPITAL_COMMUNITY)
Admission: EM | Admit: 2016-01-29 | Discharge: 2016-01-29 | Disposition: A | Discharge status: Home / Self Care | Payer: Medicare Other | Attending: Emergency Medicine | Admitting: Emergency Medicine

## 2016-01-29 ENCOUNTER — Telehealth: Payer: Self-pay

## 2016-01-29 ENCOUNTER — Emergency Department (HOSPITAL_BASED_OUTPATIENT_CLINIC_OR_DEPARTMENT_OTHER)
Admit: 2016-01-29 | Discharge: 2016-01-29 | Disposition: A | Discharge status: Home / Self Care | Payer: Medicare Other | Attending: Emergency Medicine | Admitting: Emergency Medicine

## 2016-01-29 ENCOUNTER — Other Ambulatory Visit: Payer: Self-pay | Admitting: Internal Medicine

## 2016-01-29 ENCOUNTER — Encounter (HOSPITAL_COMMUNITY): Payer: Self-pay | Admitting: Emergency Medicine

## 2016-01-29 ENCOUNTER — Encounter: Payer: Self-pay | Admitting: Internal Medicine

## 2016-01-29 ENCOUNTER — Emergency Department (HOSPITAL_COMMUNITY): Payer: Medicare Other

## 2016-01-29 DIAGNOSIS — M79609 Pain in unspecified limb: Secondary | ICD-10-CM | POA: Diagnosis not present

## 2016-01-29 DIAGNOSIS — Z8673 Personal history of transient ischemic attack (TIA), and cerebral infarction without residual deficits: Secondary | ICD-10-CM | POA: Diagnosis not present

## 2016-01-29 DIAGNOSIS — Z87891 Personal history of nicotine dependence: Secondary | ICD-10-CM | POA: Diagnosis not present

## 2016-01-29 DIAGNOSIS — Z7982 Long term (current) use of aspirin: Secondary | ICD-10-CM | POA: Diagnosis not present

## 2016-01-29 DIAGNOSIS — Z79899 Other long term (current) drug therapy: Secondary | ICD-10-CM | POA: Diagnosis not present

## 2016-01-29 DIAGNOSIS — I10 Essential (primary) hypertension: Secondary | ICD-10-CM | POA: Diagnosis not present

## 2016-01-29 DIAGNOSIS — R1032 Left lower quadrant pain: Secondary | ICD-10-CM

## 2016-01-29 DIAGNOSIS — E039 Hypothyroidism, unspecified: Secondary | ICD-10-CM | POA: Insufficient documentation

## 2016-01-29 MED ORDER — ALBUTEROL SULFATE HFA 108 (90 BASE) MCG/ACT IN AERS: 2.0000 | INHALATION_SPRAY | Freq: Four times a day (QID) | RESPIRATORY_TRACT | 0 refills | Status: DC | PRN

## 2016-01-29 MED ORDER — ACETAMINOPHEN 500 MG PO TABS
1000.0000 mg | ORAL_TABLET | Freq: Once | ORAL | Status: DC
  Filled 2016-01-29: qty 2

## 2016-01-29 NOTE — ED Notes (Signed)
MD at bedside. 

## 2016-01-29 NOTE — ED Triage Notes (Signed)
P[t states woke up at 5 am with left groin pain, called her dr and was told to come to er ? Clot, pain rads to thigh put a heating pad on it and thaT HELPED

## 2016-01-29 NOTE — Progress Notes (Signed)
**  Preliminary results**  Left lower extremity venus duplex completed.  Negative for deep and superficial vein thrombosis and baker's cyst.  01/29/2016 10:09 AM Carlos Levering RVT

## 2016-01-29 NOTE — ED Notes (Signed)
Patient transported to Ultrasound 

## 2016-01-29 NOTE — Telephone Encounter (Signed)
Called patients daughter Jose Persia. She states she did take her mother to the ED this morning and they ruled out a blood clot. She did state she would like an inhaler for her mother as she is having some wheezing and she feels the Advair she had previously really helped. The patient is scheduled for 02/14/16 for a follow up appointment. I did offer an earlier appointment but the daughter declined due to previous appointments her mother already has set up (with other providers). Instructed to call if new or worsening symptoms. Verbalized understanding.   Patient Name: [name] Gender: Female DOB: Feb 18, 1925 Age: 82 Y 56 M 5 D Return Phone Number: [phone] (Primary) Address: City/State/Zip: [place] Client [facility] Night - Client Client Site [facility] - Night Physician Simonne Martinet - MD Contact Type Call Who Is Calling Patient / Member / Family / Caregiver Call Type Triage / Clinical Caller Name Jose Persia Relationship To Patient Daughter Return Phone Number [phone] (Primary) Chief Complaint Leg Pain Reason for Call Symptomatic / Request for [place] states mother c/o groin and upper thigh pain. PreDisposition Go to ED Translation No Nurse Assessment Nurse: Ysidro Evert, RN, Shirlean Mylar Date/Time Eilene Ghazi Time): 01/29/2016 7:47:19 AM Confirm and document reason for call. If symptomatic, describe symptoms. You must click the next button to save text entered. ---Caller states that her mother is c/o of left groin and thigh pain. Had left carotid artery surgery in May. Caller is concerned about a blood clot in her leg. Symptoms started around 5 a.m. Doesn't think she's running a fever. Has congestion in chest and throat. Doing coughing with wheezing. Vomited yesterday due to mucusin her throat. Has the patient traveled out of the country within the last 30 days? ---No Does the patient have any new or  worsening symptoms? ---Yes Will a triage be completed? ---Yes Related visit to physician within the last 2 weeks? ---No Does the PT have any chronic conditions? (i.e. diabetes, asthma, etc.) ---Yes List chronic conditions. ---HTN, hx coronary artery disease Is this a behavioral health or substance abuse call? ---No Guidelines Guideline Title Affirmed Question Affirmed Notes Nurse Date/Time Eilene Ghazi Time) Leg Pain [1] Thigh or calf pain AND [2] only 1 side AND [3] present > 1 hour Ysidro Evert, RN, Robin 01/29/2016 7:57:10 AM Disp. Time Eilene Ghazi Time) Disposition Final User 01/29/2016 7:44:50 AM Send To Clinical Follow Up Rich Brave, Amy 01/29/2016 8:01:47 AM See Physician within 4 Hours (or PCP triage) Yes Ysidro Evert, RN, Alfredo Martinez Understands: Yes Disagree/Comply: Comply Care Advice Given Per Guideline SEE PHYSICIAN WITHIN 4 HOURS (or PCP triage): * IF OFFICE WILL BE OPEN: You need to be seen within the next 3 or 4 hours. Call your doctor's office now or as soon as it opens. * IF OFFICE WILL BE CLOSED AND NO PCP TRIAGE: You need to be seen within the next 3 or 4 hours. A nearby Urgent Care Center is often a good source of care. Another choice is to go to the ER. Go sooner if you become worse. CARE ADVICE given per Leg Pain (Adult) guideline. Referrals [facility] - [facility] - ED

## 2016-01-29 NOTE — ED Provider Notes (Signed)
[facility] DEPT Provider Note   CSN: [id number] Arrival date & time: 01/29/16  0830  First Provider Contact:  First MD Initiated Contact with Patient 01/29/16 [phone]        History   Chief Complaint Chief Complaint  Patient presents with  . Groin Pain    HPI [name] is a 82 y.o. female.  Patient presents with pain in her left groin. She states she woke up this morning with it. She describes as a throbbing pain. She states it doesn't really bother her when she's walking but hurts more when she's sitting down. She is a heating pad with some improvement in symptoms. It radiates a little bit to her left thigh. She denies any swelling or leg. No calf pain. She feels a little bit of numbness in her left foot as compared to the right. She does have a history of arterial disease and had a carotid endarterectomy. She's followed by vascular surgery. She also has a prior history of aortic atherosclerosis. She is on Plavix and aspirin. Her daughter contacted her PCP who advised her to come to emergency department for evaluation for possible blood clot.    Groin Pain  Pertinent negatives include no chest pain, no abdominal pain, no headaches and no shortness of breath.    Past Medical History:  Diagnosis Date  . Anxiety   . Aortic arch atherosclerosis ([place]) 06/24/2014  . Atherosclerotic ulcer of aorta ([place]) 06/24/2014  . [place] DISEASE, LUMBAR 12/16/2008  . DIVERTICULOSIS, COLON 09/30/2008  . DYSPNEA 07/13/2008  . Graves disease   . History of embolic stroke [id number]   Left brain  . HYPERLIPIDEMIA 03/06/2007  . HYPERTENSION 03/06/2007  . HYPOTHYROIDISM 10/13/2007  . OSTEOARTHRITIS 03/06/2007  . Personal history of colonic polyps 09/30/2008  . Stroke ([place])    06/2014           . TOBACCO USE, QUIT 04/12/2009  . WEAKNESS 11/09/2007    Patient Active Problem List   Diagnosis Date Noted  . Nausea with vomiting 12/11/2015  . Carotid stenosis 11/21/2015  . Asymptomatic carotid artery  stenosis 11/13/2015  . Impaired glucose tolerance 10/27/2015  . Solitary pulmonary nodule 10/27/2015  . Thyroid nodule 10/27/2015  . Syncope 10/04/2015  . Bradycardia with less than 60 beats per minute 10/04/2015  . Paresthesia 10/03/2014  . History of embolic stroke [id number]  . Paresthesia of both feet 07/06/2014  . Aortic arch atherosclerosis ([place]) 06/24/2014  . Atherosclerotic ulcer of aorta ([place]) 06/24/2014  . Stroke ([place]) 06/22/2014  . CVA (cerebral infarction) 06/22/2014  . Bilateral carotid artery disease ([place]) 05/18/2014  . TOBACCO USE, QUIT 04/12/2009  . [place] DISEASE, LUMBAR 12/16/2008  . DIVERTICULOSIS, COLON 09/30/2008  . Personal history of colonic polyps 09/30/2008  . DYSPNEA 07/13/2008  . Weakness 11/09/2007  . Hypothyroidism 10/13/2007  . Hyperlipidemia 03/06/2007  . Essential hypertension 03/06/2007  . Osteoarthritis 03/06/2007    Past Surgical History:  Procedure Laterality Date  . CARDIAC CATHETERIZATION    . CATARACT EXTRACTION    . CHOLECYSTECTOMY    . ENDARTERECTOMY Left 11/13/2015   Procedure: LEFT CAROTID ARTERY ENDARTERECTOMY;  Surgeon: Conrad Livingston, MD;  Location: [facility];  Service: Vascular;  Laterality: Left;  . KNEE SURGERY    . PATCH ANGIOPLASTY Left 11/13/2015   Procedure: WITH 1CM X 6CM  XENOSURE BIOLOGIC PATCH ANGIOPLASTY;  Surgeon: Conrad Cambria, MD;  Location: [facility];  Service: Vascular;  Laterality: Left;  . TEE WITHOUT CARDIOVERSION N/A 06/24/2014  Procedure: TRANSESOPHAGEAL ECHOCARDIOGRAM (TEE);  Surgeon: Sanda Klein, MD;  Location: [facility] ENDOSCOPY;  Service: Cardiovascular;  Laterality: N/A;    OB History    No data available       Home Medications    Prior to Admission medications   Medication Sig Start Date End Date Taking? Authorizing Provider  ALPRAZolam Duanne Moron) 0.5 MG tablet Take 1 tablet (0.5 mg total) by mouth daily as needed for anxiety. 10/09/15   Marletta Lor, MD  amLODipine (NORVASC) 10 MG tablet Take 1 tablet (10 mg  total) by mouth daily. 11/17/15   Samantha J Rhyne, PA-C  aspirin EC 81 MG tablet Take 1 tablet (81 mg total) by mouth daily. 11/23/15   Alvia Grove, PA-C  atorvastatin (LIPITOR) 80 MG tablet Take 80 mg by mouth daily.    Historical Provider, MD  bisacodyl (DULCOLAX) 5 MG EC tablet Take 10 mg by mouth daily as needed for moderate constipation.    Historical Provider, MD  clopidogrel (PLAVIX) 75 MG tablet TAKE 1 TABLET BY MOUTH EVERY DAY 12/14/15   Elam Dutch, MD  dextromethorphan-guaiFENesin [facility] DM) 30-600 MG 12hr tablet Take 1 tablet by mouth 2 (two) times daily as needed for cough.    Historical Provider, MD  docusate sodium (COLACE) 100 MG capsule Take 100 mg by mouth 2 (two) times daily as needed. Reported on 12/08/2015    Historical Provider, MD  fluticasone (FLONASE) 50 MCG/ACT nasal spray Place 2 sprays into both nostrils daily.    Historical Provider, MD  Fluticasone-Salmeterol (ADVAIR) 250-50 MCG/DOSE AEPB Inhale 1 puff into the lungs 2 (two) times daily. 01/12/16   Marletta Lor, MD  hydrALAZINE (APRESOLINE) 50 MG tablet Take 1.5 tablets (75 mg total) by mouth 3 (three) times daily. 11/17/15   Clayborne Dana, MD  levothyroxine (SYNTHROID, LEVOTHROID) 75 MCG tablet TAKE 1 TABLET BY MOUTH EVERY DAY 10/27/15   Marletta Lor, MD  ondansetron (ZOFRAN) 4 MG tablet Take 1 tablet (4 mg total) by mouth every 8 (eight) hours as needed for nausea or vomiting. 12/11/15   Cassandria Anger, MD  pantoprazole (PROTONIX) 40 MG tablet Take 1 tablet (40 mg total) by mouth daily. 12/12/15   Marletta Lor, MD    Family History Family History  Problem Relation Age of Onset  . Cancer Maternal Grandmother     stomach    Social History Social History  Substance Use Topics  . Smoking status: Former Smoker    Quit date: 07/08/1978  . Smokeless tobacco: Never Used  . Alcohol use 4.2 oz/week    7 Glasses of wine per week     Comment: "red wine"     Allergies   Catapres [clonidine  hcl]; Lisinopril; and Labetalol hcl   Review of Systems Review of Systems  Constitutional: Negative for chills, diaphoresis, fatigue and fever.  HENT: Negative for congestion, rhinorrhea and sneezing.   Eyes: Negative.   Respiratory: Negative for cough, chest tightness and shortness of breath.   Cardiovascular: Negative for chest pain and leg swelling.  Gastrointestinal: Negative for abdominal pain, blood in stool, diarrhea, nausea and vomiting.  Genitourinary: Negative for difficulty urinating, flank pain, frequency and hematuria.  Musculoskeletal: Positive for arthralgias and myalgias. Negative for back pain.  Skin: Negative for rash.  Neurological: Negative for dizziness, speech difficulty, weakness, numbness and headaches.     Physical Exam Updated Vital Signs BP 126/64   Pulse 73   Temp 98 F (36.7 C) (Oral)  Resp 17   SpO2 96%   Physical Exam  Constitutional: She is oriented to person, place, and time. She appears well-developed and well-nourished.  HENT:  Head: Normocephalic and atraumatic.  Eyes: Pupils are equal, round, and reactive to light.  Neck: Normal range of motion. Neck supple.  Cardiovascular: Normal rate, regular rhythm and normal heart sounds.   Pulmonary/Chest: Effort normal and breath sounds normal. No respiratory distress. She has no wheezes. She has no rales. She exhibits no tenderness.  Abdominal: Soft. Bowel sounds are normal. There is no tenderness. There is no rebound and no guarding.  Musculoskeletal: Normal range of motion. She exhibits no edema.  Patient has no pain on range of motion the left hip. There is some mild pain on palpation of the left pubic symphysis. There is no swelling of the left leg. She has strong femoral pulse and pedal pulses in the left leg. She has good color and warmth in the foot. She has normal motor function in the left leg. There is no rashes. She has some slight diminished sensation on light touch to the left foot as  compared to the right but she has normal sensation to the calf and thigh. There is no pain on palpation of the back or sciatic nerve area.  Lymphadenopathy:    She has no cervical adenopathy.  Neurological: She is alert and oriented to person, place, and time.  Skin: Skin is warm and dry. No rash noted.  Psychiatric: She has a normal mood and affect.     ED Treatments / Results  Labs (all labs ordered are listed, but only abnormal results are displayed) Labs Reviewed - No data to display  EKG  EKG Interpretation None       Radiology Dg Hip Unilat With Pelvis 2-3 Views Left  Result Date: 01/29/2016 CLINICAL DATA:  Acute onset of left groin pain.  No injury. EXAM: DG HIP (WITH OR WITHOUT PELVIS) 2-3V LEFT COMPARISON:  None. FINDINGS: There is no fracture or dislocation. No significant arthritic changes of the left hip. Moderate arthritis of the right hip. Calcification in the iliac arteries. Slight degenerative facet arthritis in the lower lumbar spine. IMPRESSION: No acute abnormality.  Aorto iliac atherosclerosis. Electronically Signed   By: Lorriane Shire M.D.   On: 01/29/2016 09:36   Procedures Procedures (including critical care time)  Medications Ordered in ED Medications  acetaminophen (TYLENOL) tablet 1,000 mg (not administered)     Initial Impression / Assessment and Plan / ED Course  I have reviewed the triage vital signs and the nursing notes.  Pertinent labs & imaging results that were available during my care of the patient were reviewed by me and considered in my medical decision making (see chart for details).  Clinical Course    Patient presents with left groin pain. She has no evidence of arterial blockage. She has good pulses and good color in the leg. She has no ongoing neurologic deficits. She has some minor numbness but denies any ongoing numbness or weakness to the leg. There is no evidence of a DVT. There is no evidence of fracture. She has point  tenderness to the pubic symphysis which I feel is likely the etiology for her pain. She was advised to use Tylenol for symptomatic relief and follow-up with her PCP if her symptoms are not improving.  Final Clinical Impressions(s) / ED Diagnoses   Final diagnoses:  Groin pain, left    New Prescriptions New Prescriptions   No medications  on file     Malvin Johns, MD 01/29/16 1034

## 2016-01-29 NOTE — ED Notes (Signed)
Patient transported to X-ray 

## 2016-01-30 NOTE — Telephone Encounter (Signed)
Albuteral inhaler sent in to pharmacy

## 2016-01-31 ENCOUNTER — Telehealth: Payer: Self-pay | Admitting: Internal Medicine

## 2016-01-31 ENCOUNTER — Encounter: Payer: Self-pay | Admitting: Adult Health

## 2016-01-31 ENCOUNTER — Ambulatory Visit (INDEPENDENT_AMBULATORY_CARE_PROVIDER_SITE_OTHER): Payer: Medicare Other | Admitting: Adult Health

## 2016-01-31 VITALS — BP 140/60 | Temp 97.8°F | Ht 66.0 in | Wt 137.0 lb

## 2016-01-31 DIAGNOSIS — K5900 Constipation, unspecified: Secondary | ICD-10-CM

## 2016-01-31 MED ORDER — ONDANSETRON HCL 4 MG PO TABS: 4.0000 mg | ORAL_TABLET | Freq: Three times a day (TID) | ORAL | 0 refills | Status: DC | PRN

## 2016-01-31 NOTE — Telephone Encounter (Signed)
[facility] Day - Client [place] Call Center Patient Name: [name] DOB: 06-16-1925 Initial Comment Caller states mom hasn't been able to go to bathroom for four days; is constipated. Has tried several methods. Starting to feel nauseous. Nurse Assessment Nurse: Markus Daft, RN, Sherre Poot Date/Time (Eastern Time): 01/31/2016 12:58:10 PM Confirm and document reason for call. If symptomatic, describe symptoms. You must click the next button to save text entered. ---Caller states mom hasn't been able to go to bathroom for four days, last BM was Saturday d/t constipated and nauseated. Has tried stool softeners, and suppositories, and prescription strength MiraLax and nothing has helped. Vomited on Saturday. - Had a very tiny hard stool this AM. Has the patient traveled out of the country within the last 30 days? ---Not Applicable Does the patient have any new or worsening symptoms? ---Yes Will a triage be completed? ---Yes Related visit to physician within the last 2 weeks? ---No Does the PT have any chronic conditions? (i.e. diabetes, asthma, etc.) ---Yes List chronic conditions. ---Constipation especially after Carotid artery surgery 11/13/15 followed by TIA on 11/21/15; arthritis; HTN Is this a behavioral health or substance abuse call? ---No Guidelines Guideline Title Affirmed Question Affirmed Notes Constipation Last bowel movement (BM) > 4 days ago Final Disposition User See Physician within [place], Therapist, sports, [place] Comments 3:15 pm with Tommi Rumps NP set up Referrals REFERRED TO PCP OFFICE Disagree/Comply: Comply

## 2016-01-31 NOTE — Patient Instructions (Addendum)
It was a pleasure meeting you today   You are not impacted   I would like you to try a cup of warm prune juice with two pads of melted butter in it. If that does not work then mix a bottle of magnesium citrate into some sprite or 7 -up.   Follow up if that does not work   Constipation, Adult Constipation is when a person has fewer than three bowel movements a week, has difficulty having a bowel movement, or has stools that are dry, hard, or larger than normal. As people grow older, constipation is more common. A low-fiber diet, not taking in enough fluids, and taking certain medicines may make constipation worse.  CAUSES   Certain medicines, such as antidepressants, pain medicine, iron supplements, antacids, and water pills.   Certain diseases, such as diabetes, irritable bowel syndrome (IBS), thyroid disease, or depression.   Not drinking enough water.   Not eating enough fiber-rich foods.   Stress or travel.   Lack of physical activity or exercise.   Ignoring the urge to have a bowel movement.   Using laxatives too much.  SIGNS AND SYMPTOMS   Having fewer than three bowel movements a week.   Straining to have a bowel movement.   Having stools that are hard, dry, or larger than normal.   Feeling full or bloated.   Pain in the lower abdomen.   Not feeling relief after having a bowel movement.  DIAGNOSIS  Your health care provider will take a medical history and perform a physical exam. Further testing may be done for severe constipation. Some tests may include:  A barium enema X-ray to examine your rectum, colon, and, sometimes, your small intestine.   A sigmoidoscopy to examine your lower colon.   A colonoscopy to examine your entire colon. TREATMENT  Treatment will depend on the severity of your constipation and what is causing it. Some dietary treatments include drinking more fluids and eating more fiber-rich foods. Lifestyle treatments may include  regular exercise. If these diet and lifestyle recommendations do not help, your health care provider may recommend taking over-the-counter laxative medicines to help you have bowel movements. Prescription medicines may be prescribed if over-the-counter medicines do not work.  HOME CARE INSTRUCTIONS   Eat foods that have a lot of fiber, such as fruits, vegetables, whole grains, and beans.  Limit foods high in fat and processed sugars, such as french fries, hamburgers, cookies, candies, and soda.   A fiber supplement may be added to your diet if you cannot get enough fiber from foods.   Drink enough fluids to keep your urine clear or pale yellow.   Exercise regularly or as directed by your health care provider.   Go to the restroom when you have the urge to go. Do not hold it.   Only take over-the-counter or prescription medicines as directed by your health care provider. Do not take other medicines for constipation without talking to your health care provider first.  [place] IF:   You have bright red blood in your stool.   Your constipation lasts for more than 4 days or gets worse.   You have abdominal or rectal pain.   You have thin, pencil-like stools.   You have unexplained weight loss. MAKE SURE YOU:   Understand these instructions.  Will watch your condition.  Will get help right away if you are not doing well or get worse.   This information is not  intended to replace advice given to you by your health care provider. Make sure you discuss any questions you have with your health care provider.   Document Released: 03/22/2004 Document Revised: 07/15/2014 Document Reviewed: 04/05/2013 Elsevier Interactive Patient Education [employer].

## 2016-01-31 NOTE — Progress Notes (Signed)
Subjective:    Patient ID: [name], female    DOB: 06/07/1925, 82 y.o.   MRN: [id number]  HPI  82 year old female who presents to the office today for 4-5 days of constipation. She reports that she has tried stool softeners, laxatives, suppositories, and metamucil - all without relief. She has had this issue in the past.   She has been taking Zofran for nausea and vomiting over the last 2 days but reports that she was constipated prior to that.   She denies any abdominal pain, cramping or rectal pain.   Review of Systems  Constitutional: Negative.   Gastrointestinal: Positive for constipation, nausea and vomiting.  Genitourinary: Negative.   All other systems reviewed and are negative.  Past Medical History:  Diagnosis Date  . Anxiety   . Aortic arch atherosclerosis ([place]) 06/24/2014  . Atherosclerotic ulcer of aorta ([name]) 06/24/2014  . [place] DISEASE, LUMBAR 12/16/2008  . DIVERTICULOSIS, COLON 09/30/2008  . DYSPNEA 07/13/2008  . Graves disease   . History of embolic stroke [id number]   Left brain  . HYPERLIPIDEMIA 03/06/2007  . HYPERTENSION 03/06/2007  . HYPOTHYROIDISM 10/13/2007  . OSTEOARTHRITIS 03/06/2007  . Personal history of colonic polyps 09/30/2008  . Stroke ([place])    06/2014           . TOBACCO USE, QUIT 04/12/2009  . WEAKNESS 11/09/2007    Social History   Social History  . Marital status: Divorced    Spouse name: N/A  . Number of children: 4  . Years of education: college   Occupational History  . Not on file.   Social History Main Topics  . Smoking status: Former Smoker    Quit date: 07/08/1978  . Smokeless tobacco: Never Used  . Alcohol use 4.2 oz/week    7 Glasses of wine per week     Comment: "red wine"  . Drug use: No  . Sexual activity: Not on file   Other Topics Concern  . Not on file   Social History Narrative   Patient is right handed.   Patient drinks 1 cup caffeine daily.    Past Surgical History:  Procedure Laterality Date  . CARDIAC  CATHETERIZATION    . CATARACT EXTRACTION    . CHOLECYSTECTOMY    . ENDARTERECTOMY Left 11/13/2015   Procedure: LEFT CAROTID ARTERY ENDARTERECTOMY;  Surgeon: Conrad [place], MD;  Location: [facility];  Service: Vascular;  Laterality: Left;  . KNEE SURGERY    . PATCH ANGIOPLASTY Left 11/13/2015   Procedure: WITH 1CM X 6CM  XENOSURE BIOLOGIC PATCH ANGIOPLASTY;  Surgeon: Conrad Century, MD;  Location: [place];  Service: Vascular;  Laterality: Left;  . TEE WITHOUT CARDIOVERSION N/A 06/24/2014   Procedure: TRANSESOPHAGEAL ECHOCARDIOGRAM (TEE);  Surgeon: Sanda Klein, MD;  Location: [facility] ENDOSCOPY;  Service: Cardiovascular;  Laterality: N/A;    Family History  Problem Relation Age of Onset  . Cancer Maternal Grandmother     stomach    Allergies  Allergen Reactions  . Catapres [Clonidine Hcl] Other (See Comments)    Made pt feel horrible, shaky, weak and nausea  . Lisinopril Anaphylaxis    Tongue swelling  . Labetalol Hcl Other (See Comments)    Caused bradycardia and syncope    Current Outpatient Prescriptions on File Prior to Visit  Medication Sig Dispense Refill  . albuterol (PROVENTIL HFA;VENTOLIN HFA) 108 (90 Base) MCG/ACT inhaler Inhale 2 puffs into the lungs every 6 (six) hours as needed for wheezing  or shortness of breath. 1 Inhaler 0  . ALPRAZolam (XANAX) 0.5 MG tablet Take 1 tablet (0.5 mg total) by mouth daily as needed for anxiety. 30 tablet 1  . amLODipine (NORVASC) 10 MG tablet Take 1 tablet (10 mg total) by mouth daily. 30 tablet 11  . aspirin EC 81 MG tablet Take 1 tablet (81 mg total) by mouth daily. 30 tablet 0  . atorvastatin (LIPITOR) 80 MG tablet Take 80 mg by mouth daily.    . bisacodyl (DULCOLAX) 5 MG EC tablet Take 10 mg by mouth daily as needed for moderate constipation.    . clopidogrel (PLAVIX) 75 MG tablet TAKE 1 TABLET BY MOUTH EVERY DAY 30 tablet 10  . dextromethorphan-guaiFENesin (MUCINEX DM) 30-600 MG 12hr tablet Take 1 tablet by mouth 2 (two) times daily as needed for  cough.    . docusate sodium (COLACE) 100 MG capsule Take 100 mg by mouth 2 (two) times daily as needed. Reported on 12/08/2015    . fluticasone (FLONASE) 50 MCG/ACT nasal spray Place 2 sprays into both nostrils daily.    . Fluticasone-Salmeterol (ADVAIR) 250-50 MCG/DOSE AEPB Inhale 1 puff into the lungs 2 (two) times daily. 60 each 2  . hydrALAZINE (APRESOLINE) 50 MG tablet Take 1.5 tablets (75 mg total) by mouth 3 (three) times daily. 135 tablet 3  . levothyroxine (SYNTHROID, LEVOTHROID) 75 MCG tablet TAKE 1 TABLET BY MOUTH EVERY DAY 90 tablet 1  . pantoprazole (PROTONIX) 40 MG tablet Take 1 tablet (40 mg total) by mouth daily. 30 tablet 5   No current facility-administered medications on file prior to visit.     BP 140/60 (BP Location: Left Arm, Patient Position: Sitting)   Temp 97.8 F (36.6 C) (Oral)   Ht 5\' 6"  (1.676 m)   Wt 137 lb (62.1 kg)   BMI 22.11 kg/m       Objective:   Physical Exam  Constitutional: She is oriented to person, place, and time. She appears well-developed and well-nourished. No distress.  Abdominal: Soft. Bowel sounds are normal. She exhibits no distension and no mass. There is no tenderness. There is no rebound and no guarding.  Genitourinary: Rectal exam shows external hemorrhoid. Rectal exam shows no fissure, no tenderness, anal tone normal and guaiac negative stool.  Genitourinary Comments: Soft stool in vault. Does not appear to be impacted  Neurological: She is alert and oriented to person, place, and time.  Skin: Skin is warm and dry. No rash noted. She is not diaphoretic. No erythema. No pallor.  Psychiatric: She has a normal mood and affect. Her behavior is normal. Judgment and thought content normal.  Nursing note and vitals reviewed.     Assessment & Plan:  1. Constipation, unspecified constipation type - Ractal exam showed soft stool in vault.  - Advised to try warm prune juice with melted butter. If that does not work then can use Magnesium  citrate.  - Follow up if no improvement - Consider adding fiber pill or pro biotic  - Refilled Zofran but advised not to use until after she has a bowel movement.   * ondansetron (ZOFRAN) 4 MG tablet; Take 1 tablet (4 mg total) by mouth every 8 (eight) hours as needed for nausea or vomiting.  Dispense: 20 tablet; Refill: 0  [name], NP

## 2016-01-31 NOTE — Telephone Encounter (Signed)
[facility] Day - Client [place] Call Center Patient Name: [name] DOB: 1925/06/20 Initial Comment Caller states mom hasn't been able to go to bathroom for four days; is constipated. Has tried several methods. Starting to feel nauseous. Nurse Assessment Nurse: Markus Daft, RN, Sherre Poot Date/Time (Eastern Time): 01/31/2016 12:58:10 PM Confirm and document reason for call. If symptomatic, describe symptoms. You must click the next button to save text entered. ---Caller states mom hasn't been able to go to bathroom for four days, last BM was Saturday d/t constipated and nauseated. Has tried stool softeners, and suppositories, and prescription strength MiraLax and nothing has helped. Vomited on Saturday. - Had a very tiny hard stool this AM. Has the patient traveled out of the country within the last 30 days? ---Not Applicable Does the patient have any new or worsening symptoms? ---Yes Will a triage be completed? ---Yes Related visit to physician within the last 2 weeks? ---No Does the PT have any chronic conditions? (i.e. diabetes, asthma, etc.) ---Yes List chronic conditions. ---Constipation especially after Carotid artery surgery 11/13/15 followed by TIA on 11/21/15; arthritis; HTN Is this a behavioral health or substance abuse call? ---No Guidelines Guideline Title Affirmed Question Affirmed Notes Constipation Last bowel movement (BM) > 4 days ago Final Disposition User See Physician within [place], Therapist, sports, Windy Comments 3:15 pm with Tommi Rumps NP set up Referrals REFERRED TO PCP OFFICE Disagree/Comply: Comply

## 2016-02-01 ENCOUNTER — Ambulatory Visit (INDEPENDENT_AMBULATORY_CARE_PROVIDER_SITE_OTHER): Payer: Medicare Other | Admitting: Neurology

## 2016-02-01 ENCOUNTER — Encounter: Payer: Self-pay | Admitting: Neurology

## 2016-02-01 VITALS — BP 142/70 | HR 80 | Ht 66.0 in | Wt 135.5 lb

## 2016-02-01 DIAGNOSIS — I6522 Occlusion and stenosis of left carotid artery: Secondary | ICD-10-CM

## 2016-02-01 DIAGNOSIS — G522 Disorders of vagus nerve: Secondary | ICD-10-CM

## 2016-02-01 NOTE — Progress Notes (Signed)
Reason for visit: Cerebrovascular disease  [name] is an 82 y.o. female  History of present illness:  [name] is an 82 year old right-handed white female with a history of cerebrovascular disease. The patient had evidence of a high-grade left internal carotid artery stenosis, she underwent a carotid endarterectomy on 11/13/2015. The patient had a complication following the surgery of a recurrent laryngeal neuropathy on the left. The patient has had some hoarseness, initially she had difficulty with swallowing. The patient is waking up in the morning with phlegm that she has to cough up. The patient had a transient episode of speech alteration on 11/21/2015. MRI of the brain was done at that time, no acute stroke was seen. The patient is on aspirin and Plavix at this time. She returns for an evaluation.  Past Medical History:  Diagnosis Date  . Anxiety   . Aortic arch atherosclerosis ([place]) 06/24/2014  . Atherosclerotic ulcer of aorta ([place]) 06/24/2014  . [place] DISEASE, LUMBAR 12/16/2008  . DIVERTICULOSIS, COLON 09/30/2008  . DYSPNEA 07/13/2008  . Graves disease   . History of embolic stroke XX123456   Left brain  . HYPERLIPIDEMIA 03/06/2007  . HYPERTENSION 03/06/2007  . HYPOTHYROIDISM 10/13/2007  . OSTEOARTHRITIS 03/06/2007  . Personal history of colonic polyps 09/30/2008  . Stroke ([place])    06/2014           . TIA (transient ischemic attack)   . TOBACCO USE, QUIT 04/12/2009  . WEAKNESS 11/09/2007    Past Surgical History:  Procedure Laterality Date  . CARDIAC CATHETERIZATION    . CATARACT EXTRACTION    . CHOLECYSTECTOMY    . ENDARTERECTOMY Left 11/13/2015   Procedure: LEFT CAROTID ARTERY ENDARTERECTOMY;  Surgeon: Conrad Pringle, MD;  Location: [facility];  Service: Vascular;  Laterality: Left;  . KNEE SURGERY    . PATCH ANGIOPLASTY Left 11/13/2015   Procedure: WITH 1CM X 6CM  XENOSURE BIOLOGIC PATCH ANGIOPLASTY;  Surgeon: Conrad Diamondhead, MD;  Location: [facility];  Service: Vascular;   Laterality: Left;  . TEE WITHOUT CARDIOVERSION N/A 06/24/2014   Procedure: TRANSESOPHAGEAL ECHOCARDIOGRAM (TEE);  Surgeon: Sanda Klein, MD;  Location: [facility] ENDOSCOPY;  Service: Cardiovascular;  Laterality: N/A;    Family History  Problem Relation Age of Onset  . Cancer Maternal Grandmother     stomach    Social history:  reports that she quit smoking about 37 years ago. She has never used smokeless tobacco. She reports that she drinks about 4.2 oz of alcohol per week . She reports that she does not use drugs.    Allergies  Allergen Reactions  . Catapres [Clonidine Hcl] Other (See Comments)    Made pt feel horrible, shaky, weak and nausea  . Lisinopril Anaphylaxis    Tongue swelling  . Labetalol Hcl Other (See Comments)    Caused bradycardia and syncope    Medications:  Prior to Admission medications   Medication Sig Start Date End Date Taking? Authorizing Provider  albuterol (PROVENTIL HFA;VENTOLIN HFA) 108 (90 Base) MCG/ACT inhaler Inhale 2 puffs into the lungs every 6 (six) hours as needed for wheezing or shortness of breath. 01/29/16  Yes Marletta Lor, MD  ALPRAZolam Duanne Moron) 0.5 MG tablet Take 1 tablet (0.5 mg total) by mouth daily as needed for anxiety. 10/09/15  Yes Marletta Lor, MD  amLODipine (NORVASC) 10 MG tablet Take 1 tablet (10 mg total) by mouth daily. 11/17/15  Yes Samantha J Rhyne, PA-C  aspirin EC 81 MG tablet Take  1 tablet (81 mg total) by mouth daily. 11/23/15  Yes Alvia Grove, PA-C  atorvastatin (LIPITOR) 80 MG tablet Take 80 mg by mouth daily.   Yes Historical Provider, MD  bisacodyl (DULCOLAX) 5 MG EC tablet Take 10 mg by mouth daily as needed for moderate constipation.   Yes Historical Provider, MD  clopidogrel (PLAVIX) 75 MG tablet TAKE 1 TABLET BY MOUTH EVERY DAY 12/14/15  Yes Elam Dutch, MD  dextromethorphan-guaiFENesin [facility] DM) 30-600 MG 12hr tablet Take 1 tablet by mouth 2 (two) times daily as needed for cough.   Yes Historical  Provider, MD  docusate sodium (COLACE) 100 MG capsule Take 100 mg by mouth 2 (two) times daily as needed. Reported on 12/08/2015   Yes Historical Provider, MD  fluticasone (FLONASE) 50 MCG/ACT nasal spray Place 2 sprays into both nostrils daily.   Yes Historical Provider, MD  Fluticasone-Salmeterol (ADVAIR) 250-50 MCG/DOSE AEPB Inhale 1 puff into the lungs 2 (two) times daily. 01/12/16  Yes Marletta Lor, MD  hydrALAZINE (APRESOLINE) 50 MG tablet Take 1.5 tablets (75 mg total) by mouth 3 (three) times daily. 11/17/15  Yes Clayborne Dana, MD  levothyroxine (SYNTHROID, LEVOTHROID) 75 MCG tablet TAKE 1 TABLET BY MOUTH EVERY DAY 10/27/15  Yes Marletta Lor, MD  ondansetron (ZOFRAN) 4 MG tablet Take 1 tablet (4 mg total) by mouth every 8 (eight) hours as needed for nausea or vomiting. 01/31/16  Yes [name], NP  pantoprazole (PROTONIX) 40 MG tablet Take 1 tablet (40 mg total) by mouth daily. 12/12/15  Yes Marletta Lor, MD    ROS:  Out of a complete 14 system review of symptoms, the patient complains only of the following symptoms, and all other reviewed systems are negative.  Hearing loss Cough, wheezing, shortness of breath  Height 5\' 6"  (1.676 m), weight 135 lb 8 oz (61.5 kg).  Physical Exam  General: The patient is alert and cooperative at the time of the examination.  Skin: No significant peripheral edema is noted.   Neurologic Exam  Mental status: The patient is alert and oriented x 3 at the time of the examination. The patient has apparent normal recent and remote memory, with an apparently normal attention span and concentration ability.   Cranial nerves: Facial symmetry is present. Speech is dysphonic, hoarse, not aphasic. Extraocular movements are full. Visual fields are full.  Motor: The patient has good strength in all 4 extremities.  Sensory examination: Soft touch sensation is symmetric on the face, arms, and legs.  Coordination: The patient has good  finger-nose-finger and heel-to-shin bilaterally.  Gait and station: The patient has a normal gait. Tandem gait is normal. Romberg is negative. No drift is seen.  Reflexes: Deep tendon reflexes are symmetric.   MRI bran 11/21/15:  IMPRESSION: No acute stroke is evident.  Chronic changes as described.  * MRI scan images were reviewed online. I agree with the written report.    Assessment/Plan:  1. Cerebrovascular disease, left carotid stenosis, status post recent left carotid enterectomy  2. Left recurrent laryngeal neuropathy  The patient has done well following the carotid enterectomy with exception of some residual hoarseness of her voice. Hopefully this will improve over time. She is being followed by Dr. Radene Journey. She will follow-up through this office on an as-needed basis. Dr. Bridgett Larsson will be following her carotid Doppler studies over time.  Jill Alexanders MD 02/01/2016 10:30 AM  [facility] [address] [place], [place]  [address]  Phone [phone] Fax [phone]

## 2016-02-14 ENCOUNTER — Ambulatory Visit (INDEPENDENT_AMBULATORY_CARE_PROVIDER_SITE_OTHER): Payer: Medicare Other | Admitting: Internal Medicine

## 2016-02-14 ENCOUNTER — Encounter: Payer: Self-pay | Admitting: Internal Medicine

## 2016-02-14 VITALS — BP 160/80 | HR 81 | Temp 97.5°F | Ht 66.0 in | Wt 137.0 lb

## 2016-02-14 DIAGNOSIS — R0602 Shortness of breath: Secondary | ICD-10-CM | POA: Diagnosis not present

## 2016-02-14 DIAGNOSIS — I700 Atherosclerosis of aorta: Secondary | ICD-10-CM | POA: Diagnosis not present

## 2016-02-14 DIAGNOSIS — I6522 Occlusion and stenosis of left carotid artery: Secondary | ICD-10-CM | POA: Diagnosis not present

## 2016-02-14 DIAGNOSIS — I10 Essential (primary) hypertension: Secondary | ICD-10-CM | POA: Diagnosis not present

## 2016-02-14 MED ORDER — CLOPIDOGREL BISULFATE 75 MG PO TABS: 75.0000 mg | ORAL_TABLET | Freq: Every day | ORAL | 10 refills | Status: DC

## 2016-02-14 MED ORDER — AMLODIPINE BESYLATE 10 MG PO TABS: 10.0000 mg | ORAL_TABLET | Freq: Every day | ORAL | 11 refills | Status: DC

## 2016-02-14 NOTE — Progress Notes (Signed)
Subjective:    Patient ID: [name], female    DOB: [birth year], 82 y.o.   MRN: [id number]  HPI  82 year old patient who is seen today in follow-up.  She has essential hypertension.  She has been seen by neurology recently for follow-up with bilateral carotid artery disease as well as aortic arch atherosclerosis.  She remains on antiplatelet therapy and doing well She does have some dyspnea on exertion which seems to be improved with Advair No focal neurological symptoms.  She is also seen recently for constipation that has improved  Past Medical History:  Diagnosis Date  . Anxiety   . Aortic arch atherosclerosis ([place]) 06/24/2014  . Atherosclerotic ulcer of aorta ([place]) 06/24/2014  . [place] DISEASE, LUMBAR 12/16/2008  . DIVERTICULOSIS, COLON 09/30/2008  . DYSPNEA 07/13/2008  . Graves disease   . History of embolic stroke XX123456   Left brain  . HYPERLIPIDEMIA 03/06/2007  . HYPERTENSION 03/06/2007  . HYPOTHYROIDISM 10/13/2007  . OSTEOARTHRITIS 03/06/2007  . Personal history of colonic polyps 09/30/2008  . Stroke ([place])    06/2014           . TIA (transient ischemic attack)   . TOBACCO USE, QUIT 04/12/2009  . WEAKNESS 11/09/2007     Social History   Social History  . Marital status: Divorced    Spouse name: N/A  . Number of children: 4  . Years of education: college   Occupational History  . N/A    Social History Main Topics  . Smoking status: Former Smoker    Quit date: 07/08/1978  . Smokeless tobacco: Never Used  . Alcohol use 4.2 oz/week    7 Glasses of wine per week     Comment: "red wine"  . Drug use: No  . Sexual activity: Not on file   Other Topics Concern  . Not on file   Social History Narrative      Patient is right handed.   Patient drinks 1 cup caffeine daily.    Past Surgical History:  Procedure Laterality Date  . CARDIAC CATHETERIZATION    . CATARACT EXTRACTION    . CHOLECYSTECTOMY    . ENDARTERECTOMY Left 11/13/2015   Procedure: LEFT CAROTID  ARTERY ENDARTERECTOMY;  Surgeon: Conrad Beaum[place], MD;  Location: [facility];  Service: Vascular;  Laterality: Left;  . KNEE SURGERY    . PATCH ANGIOPLASTY Left 11/13/2015   Procedure: WITH 1CM X 6CM  XENOSURE BIOLOGIC PATCH ANGIOPLASTY;  Surgeon: Conrad [place], MD;  Location: [place];  Service: Vascular;  Laterality: Left;  . TEE WITHOUT CARDIOVERSION N/A 06/24/2014   Procedure: TRANSESOPHAGEAL ECHOCARDIOGRAM (TEE);  Surgeon: Sanda Klein, MD;  Location: [facility] ENDOSCOPY;  Service: Cardiovascular;  Laterality: N/A;    Family History  Problem Relation Age of Onset  . Cancer Maternal Grandmother     stomach    Allergies  Allergen Reactions  . Catapres [Clonidine Hcl] Other (See Comments)    Made pt feel horrible, shaky, weak and nausea  . Lisinopril Anaphylaxis    Tongue swelling  . Labetalol Hcl Other (See Comments)    Caused bradycardia and syncope    Current Outpatient Prescriptions on File Prior to Visit  Medication Sig Dispense Refill  . albuterol (PROVENTIL HFA;VENTOLIN HFA) 108 (90 Base) MCG/ACT inhaler Inhale 2 puffs into the lungs every 6 (six) hours as needed for wheezing or shortness of breath. 1 Inhaler 0  . ALPRAZolam (XANAX) 0.5 MG tablet Take 1 tablet (0.5 mg total) by mouth  daily as needed for anxiety. 30 tablet 1  . amLODipine (NORVASC) 10 MG tablet Take 1 tablet (10 mg total) by mouth daily. 30 tablet 11  . aspirin EC 81 MG tablet Take 1 tablet (81 mg total) by mouth daily. 30 tablet 0  . atorvastatin (LIPITOR) 80 MG tablet Take 80 mg by mouth daily.    . bisacodyl (DULCOLAX) 5 MG EC tablet Take 10 mg by mouth daily as needed for moderate constipation.    . clopidogrel (PLAVIX) 75 MG tablet TAKE 1 TABLET BY MOUTH EVERY DAY 30 tablet 10  . dextromethorphan-guaiFENesin (MUCINEX DM) 30-600 MG 12hr tablet Take 1 tablet by mouth 2 (two) times daily as needed for cough.    . docusate sodium (COLACE) 100 MG capsule Take 100 mg by mouth 2 (two) times daily as needed. Reported on 12/08/2015     . fluticasone (FLONASE) 50 MCG/ACT nasal spray Place 2 sprays into both nostrils daily.    . Fluticasone-Salmeterol (ADVAIR) 250-50 MCG/DOSE AEPB Inhale 1 puff into the lungs 2 (two) times daily. 60 each 2  . hydrALAZINE (APRESOLINE) 50 MG tablet Take 1.5 tablets (75 mg total) by mouth 3 (three) times daily. 135 tablet 3  . levothyroxine (SYNTHROID, LEVOTHROID) 75 MCG tablet TAKE 1 TABLET BY MOUTH EVERY DAY 90 tablet 1  . ondansetron (ZOFRAN) 4 MG tablet Take 1 tablet (4 mg total) by mouth every 8 (eight) hours as needed for nausea or vomiting. 20 tablet 0  . pantoprazole (PROTONIX) 40 MG tablet Take 1 tablet (40 mg total) by mouth daily. 30 tablet 5   No current facility-administered medications on file prior to visit.     BP (!) 160/80 (BP Location: Right Arm, Patient Position: Sitting, Cuff Size: Normal)   Pulse 81   Temp 97.5 F (36.4 C) (Oral)   Ht 5\' 6"  (1.676 m)   Wt 137 lb (62.1 kg)   SpO2 97%   BMI 22.11 kg/m     Review of Systems  Constitutional: Negative.   HENT: Positive for voice change. Negative for congestion, dental problem, hearing loss, rhinorrhea, sinus pressure, sore throat and tinnitus.   Eyes: Negative for pain, discharge and visual disturbance.  Respiratory: Positive for shortness of breath. Negative for cough.   Cardiovascular: Negative for chest pain, palpitations and leg swelling.  Gastrointestinal: Negative for abdominal distention, abdominal pain, blood in stool, constipation, diarrhea, nausea and vomiting.  Genitourinary: Negative for difficulty urinating, dysuria, flank pain, frequency, hematuria, pelvic pain, urgency, vaginal bleeding, vaginal discharge and vaginal pain.  Musculoskeletal: Negative for arthralgias, gait problem and joint swelling.  Skin: Negative for rash.  Neurological: Negative for dizziness, syncope, speech difficulty, weakness, numbness and headaches.  Hematological: Negative for adenopathy.  Psychiatric/Behavioral: Negative for  agitation, behavioral problems and dysphoric mood. The patient is not nervous/anxious.        Objective:   Physical Exam  Constitutional: She is oriented to person, place, and time. She appears well-developed and well-nourished.   Remains hoarse Repeat blood pressure 144/64  HENT:  Head: Normocephalic.  Right Ear: External ear normal.  Left Ear: External ear normal.  Mouth/Throat: Oropharynx is clear and moist.  Eyes: Conjunctivae and EOM are normal. Pupils are equal, round, and reactive to light.  Neck: Normal range of motion. Neck supple. No thyromegaly present.  Cardiovascular: Normal rate, regular rhythm, normal heart sounds and intact distal pulses.   Pulmonary/Chest: Effort normal.  Mildly diminished breath sounds but clear O2 saturation 97  Abdominal: Soft. Bowel sounds  are normal. She exhibits no mass. There is no tenderness.  Musculoskeletal: Normal range of motion.  Lymphadenopathy:    She has no cervical adenopathy.  Neurological: She is alert and oriented to person, place, and time.  Skin: Skin is warm and dry. No rash noted.  Psychiatric: She has a normal mood and affect. Her behavior is normal.          Assessment & Plan:   Essential hypertension.  Reasonable control.  No change in therapy Carotid artery disease.  Continue statin and efforts at good blood pressure control.  Continue antiplatelet therapy  Recheck 4 months  Nyoka Cowden, MD

## 2016-02-14 NOTE — Patient Instructions (Signed)
Limit your sodium (Salt) intake  Please check your blood pressure on a regular basis.  If it is consistently greater than 150/90, please make an office appointment.  Return in 4 months for follow-up  

## 2016-02-14 NOTE — Progress Notes (Signed)
Pre visit review using our clinic review tool, if applicable. No additional management support is needed unless otherwise documented below in the visit note. 

## 2016-02-27 ENCOUNTER — Other Ambulatory Visit: Payer: Self-pay | Admitting: Physician Assistant

## 2016-03-07 ENCOUNTER — Other Ambulatory Visit: Payer: Self-pay | Admitting: Physician Assistant

## 2016-03-07 DIAGNOSIS — I739 Peripheral vascular disease, unspecified: Secondary | ICD-10-CM

## 2016-03-07 DIAGNOSIS — R911 Solitary pulmonary nodule: Secondary | ICD-10-CM

## 2016-03-07 DIAGNOSIS — R0989 Other specified symptoms and signs involving the circulatory and respiratory systems: Secondary | ICD-10-CM

## 2016-03-07 DIAGNOSIS — E039 Hypothyroidism, unspecified: Secondary | ICD-10-CM

## 2016-03-07 DIAGNOSIS — E785 Hyperlipidemia, unspecified: Secondary | ICD-10-CM

## 2016-03-07 DIAGNOSIS — I779 Disorder of arteries and arterioles, unspecified: Secondary | ICD-10-CM

## 2016-03-07 DIAGNOSIS — Z8673 Personal history of transient ischemic attack (TIA), and cerebral infarction without residual deficits: Secondary | ICD-10-CM

## 2016-03-07 DIAGNOSIS — F419 Anxiety disorder, unspecified: Secondary | ICD-10-CM

## 2016-03-27 ENCOUNTER — Encounter: Payer: Self-pay | Admitting: Pulmonary Disease

## 2016-03-27 ENCOUNTER — Ambulatory Visit (INDEPENDENT_AMBULATORY_CARE_PROVIDER_SITE_OTHER): Payer: Medicare Other | Admitting: Pulmonary Disease

## 2016-03-27 VITALS — BP 132/80 | HR 84 | Ht 66.0 in | Wt 139.6 lb

## 2016-03-27 DIAGNOSIS — R0600 Dyspnea, unspecified: Secondary | ICD-10-CM

## 2016-03-27 MED ORDER — TIOTROPIUM BROMIDE-OLODATEROL 2.5-2.5 MCG/ACT IN AERS: 2.0000 | INHALATION_SPRAY | Freq: Every day | RESPIRATORY_TRACT | 3 refills | Status: DC

## 2016-03-27 NOTE — Patient Instructions (Signed)
Stop taking the Advair. We'll start you on stiolto inhaler. You will be scheduled for pulmonary function tests and a CT scan of the chest without contrast.  You already have a flutter valve and incentive spirometry at home. Please start using this 3 times a day.  Return to clinic in 1 month.

## 2016-03-27 NOTE — Progress Notes (Signed)
[name]    [id number]    03/12/1925  Primary Care Physician:KWIATKOWSKI,PETER Pilar Plate, MD  Referring Physician: Marletta Lor, MD [place], [place] [address]  Chief complaint:  Consult for evaluation of dyspnea.  HPI: [name] is a 82 year old with complaints of dyspnea on exertion. The problem started in May 2017 after a carotid artery end arterectomy. This procedure was complicated by left vocal cord paralysis, hoarseness. She complains of daily cough with sputum production, wheezing. She denies any fevers, chills, hemoptysis. She had been tried on Advair for 2 months with some improvement in symptoms. She has difficulty clearing secretions. She has has a flutter valve and spirometer at home but is not using this on a regular basis.   Outpatient Encounter Prescriptions as of 03/27/2016  Medication Sig  . albuterol (PROVENTIL HFA;VENTOLIN HFA) 108 (90 Base) MCG/ACT inhaler Inhale 2 puffs into the lungs every 6 (six) hours as needed for wheezing or shortness of breath.  [name] Kitchen amLODipine (NORVASC) 10 MG tablet Take 1 tablet (10 mg total) by mouth daily.  [name] Kitchen aspirin EC 81 MG tablet Take 1 tablet (81 mg total) by mouth daily.  [name] Kitchen atorvastatin (LIPITOR) 80 MG tablet Take 80 mg by mouth daily.  . clopidogrel (PLAVIX) 75 MG tablet Take 1 tablet (75 mg total) by mouth daily.  . Fluticasone-Salmeterol (ADVAIR) 250-50 MCG/DOSE AEPB Inhale 1 puff into the lungs 2 (two) times daily.  . hydrALAZINE (APRESOLINE) 25 MG tablet TAKE 3 TABLETS (75 MG TOTAL) BY MOUTH 3 (THREE) TIMES DAILY.  . hydrALAZINE (APRESOLINE) 50 MG tablet Take 1.5 tablets (75 mg total) by mouth 3 (three) times daily.  [name] Kitchen levothyroxine (SYNTHROID, LEVOTHROID) 75 MCG tablet TAKE 1 TABLET BY MOUTH EVERY DAY  . ALPRAZolam (XANAX) 0.5 MG tablet Take 1 tablet (0.5 mg total) by mouth daily as needed for anxiety. (Patient not taking: Reported on 03/27/2016)  . bisacodyl (DULCOLAX) 5 MG EC tablet Take 10  mg by mouth daily as needed for moderate constipation.  [name] Kitchen dextromethorphan-guaiFENesin (MUCINEX DM) 30-600 MG 12hr tablet Take 1 tablet by mouth 2 (two) times daily as needed for cough.  . docusate sodium (COLACE) 100 MG capsule Take 100 mg by mouth 2 (two) times daily as needed. Reported on 12/08/2015  . fluticasone (FLONASE) 50 MCG/ACT nasal spray Place 2 sprays into both nostrils daily.  . ondansetron (ZOFRAN) 4 MG tablet Take 1 tablet (4 mg total) by mouth every 8 (eight) hours as needed for nausea or vomiting. (Patient not taking: Reported on 03/27/2016)  . pantoprazole (PROTONIX) 40 MG tablet Take 1 tablet (40 mg total) by mouth daily. (Patient not taking: Reported on 03/27/2016)   No facility-administered encounter medications on file as of 03/27/2016.     Allergies as of 03/27/2016 - Review Complete 03/27/2016  Allergen Reaction Noted  . Catapres [clonidine hcl] Other (See Comments) 10/27/2015  . Lisinopril Anaphylaxis 06/22/2014  . Labetalol hcl Other (See Comments) 10/04/2015    Past Medical History:  Diagnosis Date  . Anxiety   . Aortic arch atherosclerosis ([place]) 06/24/2014  . Atherosclerotic ulcer of aorta ([place]) 06/24/2014  . [place] DISEASE, LUMBAR 12/16/2008  . DIVERTICULOSIS, COLON 09/30/2008  . DYSPNEA 07/13/2008  . Graves disease   . History of embolic stroke XX123456   Left brain  . HYPERLIPIDEMIA 03/06/2007  . HYPERTENSION 03/06/2007  . HYPOTHYROIDISM 10/13/2007  . OSTEOARTHRITIS 03/06/2007  . Personal history of colonic polyps 09/30/2008  . Stroke [facility])  06/2014           . TIA (transient ischemic attack)   . TOBACCO USE, QUIT 04/12/2009  . WEAKNESS 11/09/2007    Past Surgical History:  Procedure Laterality Date  . CARDIAC CATHETERIZATION    . CATARACT EXTRACTION    . CHOLECYSTECTOMY    . ENDARTERECTOMY Left 11/13/2015   Procedure: LEFT CAROTID ARTERY ENDARTERECTOMY;  Surgeon: Conrad [place]unkirk, MD;  Location: [place];  Service: Vascular;  Laterality: Left;  . KNEE SURGERY      . PATCH ANGIOPLASTY Left 11/13/2015   Procedure: WITH 1CM X 6CM  XENOSURE BIOLOGIC PATCH ANGIOPLASTY;  Surgeon: Conrad [place]owlerville, MD;  Location: [place];  Service: Vascular;  Laterality: Left;  . TEE WITHOUT CARDIOVERSION N/A 06/24/2014   Procedure: TRANSESOPHAGEAL ECHOCARDIOGRAM (TEE);  Surgeon: Sanda Klein, MD;  Location: [facility] ENDOSCOPY;  Service: Cardiovascular;  Laterality: N/A;    Family History  Problem Relation Age of Onset  . Cancer Maternal Grandmother     stomach    Social History   Social History  . Marital status: Divorced    Spouse name: N/A  . Number of children: 4  . Years of education: college   Occupational History  . N/A    Social History Main Topics  . Smoking status: Former Smoker    Quit date: 07/08/1978  . Smokeless tobacco: Never Used  . Alcohol use 4.2 oz/week    7 Glasses of wine per week     Comment: "red wine"  . Drug use: No  . Sexual activity: Not on file   Other Topics Concern  . Not on file   Social History Narrative      Patient is right handed.   Patient drinks 1 cup caffeine daily.     Review of systems: Review of Systems  Constitutional: Negative for fever and chills.  HENT: Negative.   Eyes: Negative for blurred vision.  Respiratory: as per HPI  Cardiovascular: Negative for chest pain and palpitations.  Gastrointestinal: Negative for vomiting, diarrhea, blood per rectum. Genitourinary: Negative for dysuria, urgency, frequency and hematuria.  Musculoskeletal: Negative for myalgias, back pain and joint pain.  Skin: Negative for itching and rash.  Neurological: Negative for dizziness, tremors, focal weakness, seizures and loss of consciousness.  Endo/Heme/Allergies: Negative for environmental allergies.  Psychiatric/Behavioral: Negative for depression, suicidal ideas and hallucinations.  All other systems reviewed and are negative.   Physical Exam: Blood pressure 132/80, pulse 84, height 5\' 6"  (1.676 m), weight 63.3 kg (139 lb 9.6  oz), SpO2 95 %. Gen:      No acute distress HEENT:  EOMI, sclera anicteric Neck:     No masses; no thyromegaly Lungs:    Clear to auscultation bilaterally; normal respiratory effort CV:         Regular rate and rhythm; no murmurs Abd:      + bowel sounds; soft, non-tender; no palpable masses, no distension Ext:    No edema; adequate peripheral perfusion Skin:      Warm and dry; no rash Neuro: alert and oriented x 3 Psych: normal mood and affect  Data Reviewed: CXR 12/11/15- Nodular opacity in left mid lung, hyperexpanded lung, images reviewed  CT scan chest 11/26/12- Right lingular opacity, scattered sub cm pulmonary nodules, images reviewed.   Assessment:  #1 COPD [name] likely has COPD based on her smoking history and x-ray findings with hyperexpanded lungs. She does not have any suggestion of asthma from history and presentation. She has  responded minimally to advair and I will switch her to [facility] and get pulmonary function tests for baseline assessment of her lung function.  I suspect she has difficulty clearing secretions due to the vocal cord paralysis. She has a flutter valve and incentive spirometer at home but is non compliant. I have encouraged her to use them at least 3 times daily.   #2 Lung nodule She has a right mid lung nodule on CXR this year that needs further evaluation. This is in the area of consolidation seen on CT scan in 2014. She will will scheduled for a follow up CT chest.   Plan/Recommendations: - Change advair to stiolto - Schedule PFTs and CT chest without contrast - Use incentive spirometer and flutter valve 3 times/ day.  Marshell Garfinkel MD [facility] Pager [phone] 03/27/2016, 3:59 PM  CC: Marletta Lor, MD

## 2016-03-28 MED ORDER — TIOTROPIUM BROMIDE-OLODATEROL 2.5-2.5 MCG/ACT IN AERS: 2.0000 | INHALATION_SPRAY | Freq: Every day | RESPIRATORY_TRACT | 0 refills | Status: AC

## 2016-03-28 NOTE — Progress Notes (Signed)
Patient seen in the office today and instructed on use of Stiolto Respimat.  Patient expressed understanding and demonstrated technique. Parke [facility]  03/27/16

## 2016-03-31 ENCOUNTER — Other Ambulatory Visit: Payer: Self-pay | Admitting: Internal Medicine

## 2016-04-02 ENCOUNTER — Ambulatory Visit (INDEPENDENT_AMBULATORY_CARE_PROVIDER_SITE_OTHER): Payer: Medicare Other | Admitting: Pulmonary Disease

## 2016-04-02 DIAGNOSIS — R0600 Dyspnea, unspecified: Secondary | ICD-10-CM | POA: Diagnosis not present

## 2016-04-02 LAB — PULMONARY FUNCTION TEST
DL/VA % PRED: 56 %
DL/VA: 2.81 ml/min/mmHg/L
DLCO COR % PRED: 44 %
DLCO COR: 11.66 ml/min/mmHg
DLCO unc % pred: 42 %
DLCO unc: 10.92 ml/min/mmHg
FEF 25-75 Post: 0.72 L/sec
FEF 25-75 Pre: 0.53 L/sec
FEF2575-%CHANGE-POST: 35 %
FEF2575-%PRED-PRE: 52 %
FEF2575-%Pred-Post: 70 %
FEV1-%Change-Post: 8 %
FEV1-%PRED-PRE: 75 %
FEV1-%Pred-Post: 82 %
FEV1-Post: 1.43 L
FEV1-Pre: 1.31 L
FEV1FVC-%CHANGE-POST: 2 %
FEV1FVC-%Pred-Pre: 76 %
FEV6-%Change-Post: 7 %
FEV6-%PRED-PRE: 104 %
FEV6-%Pred-Post: 112 %
FEV6-PRE: 2.31 L
FEV6-Post: 2.49 L
FEV6FVC-%Change-Post: 1 %
FEV6FVC-%Pred-Post: 104 %
FEV6FVC-%Pred-Pre: 102 %
FVC-%Change-Post: 6 %
FVC-%PRED-POST: 107 %
FVC-%PRED-PRE: 101 %
FVC-POST: 2.54 L
FVC-PRE: 2.39 L
POST FEV6/FVC RATIO: 98 %
PRE FEV1/FVC RATIO: 55 %
Post FEV1/FVC ratio: 56 %
Pre FEV6/FVC Ratio: 97 %
RV % pred: 112 %
RV: 2.97 L
TLC % pred: 107 %
TLC: 5.62 L

## 2016-04-05 ENCOUNTER — Ambulatory Visit (INDEPENDENT_AMBULATORY_CARE_PROVIDER_SITE_OTHER)
Admission: RE | Admit: 2016-04-05 | Discharge: 2016-04-05 | Disposition: A | Discharge status: Home / Self Care | Payer: Medicare Other | Source: Ambulatory Visit | Attending: Pulmonary Disease | Admitting: Pulmonary Disease

## 2016-04-05 DIAGNOSIS — R0600 Dyspnea, unspecified: Secondary | ICD-10-CM | POA: Diagnosis not present

## 2016-04-08 ENCOUNTER — Other Ambulatory Visit: Payer: Self-pay | Admitting: Internal Medicine

## 2016-04-09 ENCOUNTER — Telehealth: Payer: Self-pay | Admitting: Pulmonary Disease

## 2016-04-09 NOTE — Telephone Encounter (Signed)
Spoke with pt's daughter, [name]. States that pt is on her way to the hospital now. Pt felt like she could not wait on our return call. Rxs will not be sent to the pharmacy. Nothing further was needed at this time.

## 2016-04-09 NOTE — Telephone Encounter (Signed)
Please call in z pack. Azithro 500 mg on day 1 and then 250mg /day for total 7 days. Prednisone taper starting at 40 mg qd and then reduce dose by 10 mg every 3 days. It is ok to try this now. If there is no improvement or worsening of symptoms then she can go to ED.

## 2016-04-09 NOTE — Telephone Encounter (Signed)
Pt daughter [name] is calling, [place] and is having trouble breathing Used Albuterol three times today and it is not working to relieve her breathing issues. Using Stiolto as directed and is using Robitussin PRN.  Requesting something be called in to [place] in [facility] phone # [phone] Pt daughter wanting to know if she needs to tell her to go to [facility] or ED down there or just wait for something to be called in.  Please advise Dr Vaughan Browner. Thanks.

## 2016-04-10 ENCOUNTER — Telehealth: Payer: Self-pay | Admitting: Pulmonary Disease

## 2016-04-10 MED ORDER — PREDNISONE 10 MG PO TABS: ORAL_TABLET | ORAL | 0 refills | Status: DC

## 2016-04-10 MED ORDER — AMOXICILLIN-POT CLAVULANATE 500-125 MG PO TABS: 1.0000 | ORAL_TABLET | Freq: Three times a day (TID) | ORAL | 0 refills | Status: DC

## 2016-04-10 NOTE — Telephone Encounter (Signed)
Called and spoke to pt's daughter. Informed her of the recs per PM. Rx sent to preferred pharmacy. [name] verbalized understanding and denied any further questions or concerns at this time.

## 2016-04-10 NOTE — Telephone Encounter (Signed)
Spoke with pt's daughter, [name]. States that pt went to ED in [facility] yesterday. She was given a prescription for prednisone and amoxicillin. They did not get these prescriptions filled. They want these prescriptions filled by PM. There was a telephone encounter from yesterday that recommended prednisone taper and zpack but pt would not wait on our call back before she went to the ED.  PM - please advise. Thanks.

## 2016-04-10 NOTE — Telephone Encounter (Signed)
Ok to call in  Augmentin 500mg  q8 Pred taper at 40 mg. Reduce dose by 10 mg every 3 days.

## 2016-04-11 NOTE — Progress Notes (Signed)
Called spoke with patient, advised of CT results / recs as stated by PM.  Pt verbalized her understanding and denied any questions. 

## 2016-04-30 ENCOUNTER — Other Ambulatory Visit: Payer: Self-pay | Admitting: Physician Assistant

## 2016-05-01 ENCOUNTER — Encounter: Payer: Self-pay | Admitting: Pulmonary Disease

## 2016-05-01 ENCOUNTER — Ambulatory Visit (INDEPENDENT_AMBULATORY_CARE_PROVIDER_SITE_OTHER): Payer: Medicare Other | Admitting: Pulmonary Disease

## 2016-05-01 VITALS — BP 138/60 | HR 85 | Ht 66.0 in | Wt 136.8 lb

## 2016-05-01 DIAGNOSIS — J438 Other emphysema: Secondary | ICD-10-CM | POA: Diagnosis not present

## 2016-05-01 DIAGNOSIS — R0600 Dyspnea, unspecified: Secondary | ICD-10-CM | POA: Diagnosis not present

## 2016-05-01 MED ORDER — BUDESONIDE-FORMOTEROL FUMARATE 80-4.5 MCG/ACT IN AERO
2.0000 | INHALATION_SPRAY | Freq: Two times a day (BID) | RESPIRATORY_TRACT | 3 refills | Status: DC
Start: 2016-05-01 — End: 2016-10-12

## 2016-05-01 MED ORDER — BUDESONIDE-FORMOTEROL FUMARATE 80-4.5 MCG/ACT IN AERO
2.0000 | INHALATION_SPRAY | Freq: Two times a day (BID) | RESPIRATORY_TRACT | 0 refills | Status: DC
Start: 2016-05-01 — End: 2016-10-12

## 2016-05-01 MED ORDER — TIOTROPIUM BROMIDE MONOHYDRATE 2.5 MCG/ACT IN AERS: 2.0000 | INHALATION_SPRAY | Freq: Every day | RESPIRATORY_TRACT | 3 refills | Status: DC

## 2016-05-01 NOTE — Progress Notes (Signed)
[name]    IP:[id number]    [birth year]  Primary Care Physician:KWIATKOWSKI,PETER Pilar Plate, MD  Referring Physician: Marletta Lor, MD [place], [place] [address]  Chief complaint:  Follow up for Moderate COPD  HPI: [name] is a 82 year old with complaints of dyspnea on exertion. The problem started in May 2017 after a carotid artery end arterectomy. This procedure was complicated by left vocal cord paralysis, hoarseness. She complains of daily cough with sputum production, wheezing. She denies any fevers, chills, hemoptysis. She had been tried on Advair for 2 months with some improvement in symptoms. She has difficulty clearing secretions. She has has a flutter valve and spirometer at home but is not using this on a regular basis.  Interim History: She had to go to the emergency room last month for COPD exacerbation and was treated with antibiotics and prednisone. She feels back to baseline today. Chief complaints are dypnea on exertion and chronic cough.  Outpatient Encounter Prescriptions as of 05/01/2016  Medication Sig  . ALPRAZolam (XANAX) 0.5 MG tablet Take 1 tablet (0.5 mg total) by mouth daily as needed for anxiety.  [name] Kitchen amLODipine (NORVASC) 10 MG tablet Take 1 tablet (10 mg total) by mouth daily.  [name] Kitchen aspirin EC 81 MG tablet Take 1 tablet (81 mg total) by mouth daily.  [name] Kitchen atorvastatin (LIPITOR) 80 MG tablet Take 80 mg by mouth daily.  . clopidogrel (PLAVIX) 75 MG tablet Take 1 tablet (75 mg total) by mouth daily.  . hydrALAZINE (APRESOLINE) 25 MG tablet TAKE 3 TABLETS (75 MG TOTAL) BY MOUTH 3 (THREE) TIMES DAILY.  . hydrALAZINE (APRESOLINE) 50 MG tablet Take 1.5 tablets (75 mg total) by mouth 3 (three) times daily.  [name] Kitchen levothyroxine (SYNTHROID, LEVOTHROID) 75 MCG tablet TAKE 1 TABLET BY MOUTH EVERY DAY  . pantoprazole (PROTONIX) 40 MG tablet Take 1 tablet (40 mg total) by mouth daily.  [name] Kitchen PROAIR HFA 108 (90 Base) MCG/ACT inhaler INHALE 2 PUFFS  INTO THE LUNGS EVERY 6 (SIX) HOURS AS NEEDED FOR WHEEZING OR SHORTNESS OF BREATH.  . Tiotropium Bromide-Olodaterol (STIOLTO RESPIMAT) 2.5-2.5 MCG/ACT AERS Inhale 2 puffs into the lungs daily.  . [DISCONTINUED] bisacodyl (DULCOLAX) 5 MG EC tablet Take 10 mg by mouth daily as needed for moderate constipation.  . [DISCONTINUED] amoxicillin-clavulanate (AUGMENTIN) 500-125 MG tablet Take 1 tablet (500 mg total) by mouth every 8 (eight) hours. (Patient not taking: Reported on 05/01/2016)  . [DISCONTINUED] dextromethorphan-guaiFENesin (MUCINEX DM) 30-600 MG 12hr tablet Take 1 tablet by mouth 2 (two) times daily as needed for cough.  . [DISCONTINUED] docusate sodium (COLACE) 100 MG capsule Take 100 mg by mouth 2 (two) times daily as needed. Reported on 12/08/2015  . [DISCONTINUED] fluticasone (FLONASE) 50 MCG/ACT nasal spray Place 2 sprays into both nostrils daily.  . [DISCONTINUED] ondansetron (ZOFRAN) 4 MG tablet Take 1 tablet (4 mg total) by mouth every 8 (eight) hours as needed for nausea or vomiting. (Patient not taking: Reported on 05/01/2016)  . [DISCONTINUED] predniSONE (DELTASONE) 10 MG tablet Take 40mg  for 3 days, then 30mg  for 3 days, 20mg  for 3 days, 10mg  for 3 days, then stop (Patient not taking: Reported on 05/01/2016)   No facility-administered encounter medications on file as of 05/01/2016.     Allergies as of 05/01/2016 - Review Complete 05/01/2016  Allergen Reaction Noted  . Catapres [clonidine hcl] Other (See Comments) 10/27/2015  . Lisinopril Anaphylaxis 06/22/2014  . Labetalol hcl Other (See Comments) 10/04/2015  Past Medical History:  Diagnosis Date  . Anxiety   . Aortic arch atherosclerosis ([place]) 06/24/2014  . Atherosclerotic ulcer of aorta ([place]) 06/24/2014  . [place] DISEASE, LUMBAR 12/16/2008  . DIVERTICULOSIS, COLON 09/30/2008  . DYSPNEA 07/13/2008  . Graves disease   . History of embolic stroke [id number]   Left brain  . HYPERLIPIDEMIA 03/06/2007  . HYPERTENSION 03/06/2007    . HYPOTHYROIDISM 10/13/2007  . OSTEOARTHRITIS 03/06/2007  . Personal history of colonic polyps 09/30/2008  . Stroke ([place])    06/2014           . TIA (transient ischemic attack)   . TOBACCO USE, QUIT 04/12/2009  . WEAKNESS 11/09/2007    Past Surgical History:  Procedure Laterality Date  . CARDIAC CATHETERIZATION    . CATARACT EXTRACTION    . CHOLECYSTECTOMY    . ENDARTERECTOMY Left 11/13/2015   Procedure: LEFT CAROTID ARTERY ENDARTERECTOMY;  Surgeon: Conrad [place], MD;  Location: [facility];  Service: Vascular;  Laterality: Left;  . KNEE SURGERY    . PATCH ANGIOPLASTY Left 11/13/2015   Procedure: WITH 1CM X 6CM  XENOSURE BIOLOGIC PATCH ANGIOPLASTY;  Surgeon: Conrad [place], MD;  Location: [facility];  Service: Vascular;  Laterality: Left;  . TEE WITHOUT CARDIOVERSION N/A 06/24/2014   Procedure: TRANSESOPHAGEAL ECHOCARDIOGRAM (TEE);  Surgeon: Sanda Klein, MD;  Location: [facility] ENDOSCOPY;  Service: Cardiovascular;  Laterality: N/A;    Family History  Problem Relation Age of Onset  . Cancer Maternal Grandmother     stomach    Social History   Social History  . Marital status: Divorced    Spouse name: N/A  . Number of children: 4  . Years of education: college   Occupational History  . N/A    Social History Main Topics  . Smoking status: Former Smoker    Quit date: 07/08/1978  . Smokeless tobacco: Never Used  . Alcohol use 4.2 oz/week    7 Glasses of wine per week     Comment: "red wine"  . Drug use: No  . Sexual activity: Not on file   Other Topics Concern  . Not on file   Social History Narrative      Patient is right handed.   Patient drinks 1 cup caffeine daily.     Review of systems: Review of Systems  Constitutional: Negative for fever and chills.  HENT: Negative.   Eyes: Negative for blurred vision.  Respiratory: as per HPI  Cardiovascular: Negative for chest pain and palpitations.  Gastrointestinal: Negative for vomiting, diarrhea, blood per rectum. Genitourinary:  Negative for dysuria, urgency, frequency and hematuria.  Musculoskeletal: Negative for myalgias, back pain and joint pain.  Skin: Negative for itching and rash.  Neurological: Negative for dizziness, tremors, focal weakness, seizures and loss of consciousness.  Endo/Heme/Allergies: Negative for environmental allergies.  Psychiatric/Behavioral: Negative for depression, suicidal ideas and hallucinations.  All other systems reviewed and are negative.   Physical Exam: Blood pressure 138/60, pulse 85, height 5\' 6"  (1.676 m), weight 136 lb 12.8 oz (62.1 kg), SpO2 94 %. Gen:      No acute distress HEENT:  EOMI, sclera anicteric Neck:     No masses; no thyromegaly Lungs:    Clear to auscultation bilaterally; normal respiratory effort CV:         Regular rate and rhythm; no murmurs Abd:      + bowel sounds; soft, non-tender; no palpable masses, no distension Ext:    No edema; adequate peripheral perfusion Skin:  Warm and dry; no rash Neuro: alert and oriented x 3 Psych: normal mood and affect  Data Reviewed: CXR 12/11/15- Nodular opacity in left mid lung, hyperexpanded lung, images reviewed  CT scan chest 11/26/12- Right lingular opacity, scattered sub cm pulmonary nodules, images reviewed.  CT 04/05/16- Stable pulmonary nodules Severe COPD. Images reviewed  PFTs 04/02/16 FVC 2.54 (107%) FEV1 1.43 (82%) F/F 56 TLC 107% DLCO 42% Mod obstructive lung disease with DLCO impairment.  Assessment:  #1 Severe COPD Recent admission for COPD exacerbation while [place] the beach. She will get the records of her ER visit including imaging sent for review.  She feels back to baseline. She continues on the Spiriva. I will add Symbicort.  CT and PFTs reviewed. Although she has moderate obstruction on PFTs her COPD is likely severe based on lung imaging and DLCO impairment   I suspect she has difficulty clearing secretions due to the vocal cord paralysis. She has a flutter valve and  incentive spiromete. I have encouraged her to use them at least 3 times daily.   #2 Lung nodule Stable on repeat CT. Likely benign  Plan/Recommendations: - Continue spiriva. Add symbicort - Use incentive spirometer and flutter valve 3 times/ day.  Marshell Garfinkel MD [facility] Pager [phone] If no answer or after 3pm call: [phone] 05/10/2016, 4:45 AM  CC: Marletta Lor, MD

## 2016-05-01 NOTE — Patient Instructions (Addendum)
We will start you on symbicort 80/4.5 Continue using the spiriva. We will call in a refill  I will reivew the records of your hospitalization when you fax it to [place]. Return in 3 months

## 2016-05-10 DIAGNOSIS — J438 Other emphysema: Secondary | ICD-10-CM | POA: Insufficient documentation

## 2016-05-10 DIAGNOSIS — J439 Emphysema, unspecified: Secondary | ICD-10-CM | POA: Insufficient documentation

## 2016-05-14 ENCOUNTER — Other Ambulatory Visit: Payer: Self-pay | Admitting: Internal Medicine

## 2016-05-14 DIAGNOSIS — Z1231 Encounter for screening mammogram for malignant neoplasm of breast: Secondary | ICD-10-CM

## 2016-06-07 ENCOUNTER — Ambulatory Visit
Admission: RE | Admit: 2016-06-07 | Discharge: 2016-06-07 | Disposition: A | Discharge status: Home / Self Care | Payer: Medicare Other | Source: Ambulatory Visit | Attending: Internal Medicine | Admitting: Internal Medicine

## 2016-06-07 DIAGNOSIS — Z1231 Encounter for screening mammogram for malignant neoplasm of breast: Secondary | ICD-10-CM

## 2016-06-17 ENCOUNTER — Ambulatory Visit: Payer: Medicare Other | Admitting: Internal Medicine

## 2016-06-18 ENCOUNTER — Other Ambulatory Visit: Payer: Self-pay | Admitting: Physician Assistant

## 2016-06-18 DIAGNOSIS — F419 Anxiety disorder, unspecified: Secondary | ICD-10-CM

## 2016-06-18 DIAGNOSIS — I779 Disorder of arteries and arterioles, unspecified: Secondary | ICD-10-CM

## 2016-06-18 DIAGNOSIS — R0989 Other specified symptoms and signs involving the circulatory and respiratory systems: Secondary | ICD-10-CM

## 2016-06-18 DIAGNOSIS — Z8673 Personal history of transient ischemic attack (TIA), and cerebral infarction without residual deficits: Secondary | ICD-10-CM

## 2016-06-18 DIAGNOSIS — I739 Peripheral vascular disease, unspecified: Secondary | ICD-10-CM

## 2016-06-18 DIAGNOSIS — R911 Solitary pulmonary nodule: Secondary | ICD-10-CM

## 2016-06-18 MED ORDER — HYDRALAZINE HCL 25 MG PO TABS: 75.0000 mg | ORAL_TABLET | Freq: Three times a day (TID) | ORAL | 4 refills | Status: DC

## 2016-08-06 ENCOUNTER — Ambulatory Visit (INDEPENDENT_AMBULATORY_CARE_PROVIDER_SITE_OTHER): Payer: Medicare Other | Admitting: Pulmonary Disease

## 2016-08-06 ENCOUNTER — Encounter: Payer: Self-pay | Admitting: Pulmonary Disease

## 2016-08-06 VITALS — BP 150/60 | HR 88 | Ht 66.0 in

## 2016-08-06 DIAGNOSIS — J438 Other emphysema: Secondary | ICD-10-CM | POA: Diagnosis not present

## 2016-08-06 MED ORDER — FLUTICASONE-UMECLIDIN-VILANT 100-62.5-25 MCG/INH IN AEPB: 1.0000 | INHALATION_SPRAY | Freq: Every day | RESPIRATORY_TRACT | 0 refills | Status: DC

## 2016-08-06 MED ORDER — FLUTICASONE-UMECLIDIN-VILANT 100-62.5-25 MCG/INH IN AEPB: 1.0000 | INHALATION_SPRAY | Freq: Every day | RESPIRATORY_TRACT | 5 refills | Status: DC

## 2016-08-06 NOTE — Patient Instructions (Addendum)
We will start you on trelegy inhaler. Please use this instead of the Symbicort and Spiriva Continue using the Flonase and Robitussin-DM  Return to clinic in 6 months.

## 2016-08-06 NOTE — Progress Notes (Signed)
[name]    IP:[id number]    [birth year]  Primary Care Physician:KWIATKOWSKI,PETER Pilar Plate, MD  Referring Physician: Marletta Lor, MD [place], [place] [address]  Chief complaint:  Follow up for  Moderate COPD Vocal cord paralysis  HPI: [name] is a 83 year old with complaints of dyspnea on exertion. The problem started in May 2017 after a carotid artery end arterectomy. This procedure was complicated by left vocal cord paralysis, hoarseness. She complains of daily cough with sputum production, wheezing. She denies any fevers, chills, hemoptysis. She had been tried on Advair for 2 months with some improvement in symptoms. She has difficulty clearing secretions. She has has a flutter valve and spirometer at home but is not using this on a regular basis.  She was hospitalized in the emergency room in October 2017 for COPD exacerbation. She was discharged on a prednisone taper and amoxicillin. Records from this hospitalization reviewed below in data section  Interim History: She is stable since her hospitalization. She feels that her inhalers are helping with her breathing. She has some increased congestion which she is treating with Robitussin-DM. She recently saw Dr. Lucia Gaskins, ENT for sinusitis and follow up for vocal cord paralysis. As per the patient her vocal cord paralysis is improving. She is currently on Flonase which also helps with symptoms.  Outpatient Encounter Prescriptions as of 08/06/2016  Medication Sig  . ALPRAZolam (XANAX) 0.5 MG tablet Take 1 tablet (0.5 mg total) by mouth daily as needed for anxiety.  [name] Kitchen amLODipine (NORVASC) 10 MG tablet Take 1 tablet (10 mg total) by mouth daily.  [name] Kitchen aspirin EC 81 MG tablet Take 1 tablet (81 mg total) by mouth daily.  [name] Kitchen atorvastatin (LIPITOR) 80 MG tablet Take 80 mg by mouth daily.  . budesonide-formoterol (SYMBICORT) 80-4.5 MCG/ACT inhaler Inhale 2 puffs into the lungs 2 (two) times daily.  .  budesonide-formoterol (SYMBICORT) 80-4.5 MCG/ACT inhaler Inhale 2 puffs into the lungs 2 (two) times daily.  . clopidogrel (PLAVIX) 75 MG tablet Take 1 tablet (75 mg total) by mouth daily.  . fluticasone (FLONASE) 50 MCG/ACT nasal spray Place into both nostrils daily.  . hydrALAZINE (APRESOLINE) 25 MG tablet Take 3 tablets (75 mg total) by mouth 3 (three) times daily.  . hydrALAZINE (APRESOLINE) 50 MG tablet Take 1.5 tablets (75 mg total) by mouth 3 (three) times daily.  [name] Kitchen levothyroxine (SYNTHROID, LEVOTHROID) 75 MCG tablet TAKE 1 TABLET BY MOUTH EVERY DAY  . PROAIR HFA 108 (90 Base) MCG/ACT inhaler INHALE 2 PUFFS INTO THE LUNGS EVERY 6 (SIX) HOURS AS NEEDED FOR WHEEZING OR SHORTNESS OF BREATH.  . Tiotropium Bromide Monohydrate (SPIRIVA RESPIMAT) 2.5 MCG/ACT AERS Inhale 2 puffs into the lungs daily.  . [DISCONTINUED] pantoprazole (PROTONIX) 40 MG tablet Take 1 tablet (40 mg total) by mouth daily.  . [DISCONTINUED] Tiotropium Bromide-Olodaterol (STIOLTO RESPIMAT) 2.5-2.5 MCG/ACT AERS Inhale 2 puffs into the lungs daily.   No facility-administered encounter medications on file as of 08/06/2016.     Allergies as of 08/06/2016 - Review Complete 08/06/2016  Allergen Reaction Noted  . Catapres [clonidine hcl] Other (See Comments) 10/27/2015  . Lisinopril Anaphylaxis 06/22/2014  . Labetalol hcl Other (See Comments) 10/04/2015    Past Medical History:  Diagnosis Date  . Anxiety   . Aortic arch atherosclerosis ([place]) 06/24/2014  . Atherosclerotic ulcer of aorta ([place]) 06/24/2014  . [place] DISEASE, LUMBAR 12/16/2008  . DIVERTICULOSIS, COLON 09/30/2008  . DYSPNEA 07/13/2008  .  Graves disease   . History of embolic stroke [id number]   Left brain  . HYPERLIPIDEMIA 03/06/2007  . HYPERTENSION 03/06/2007  . HYPOTHYROIDISM 10/13/2007  . OSTEOARTHRITIS 03/06/2007  . Personal history of colonic polyps 09/30/2008  . Stroke ([place])    06/2014           . TIA (transient ischemic attack)   . TOBACCO USE, QUIT  04/12/2009  . WEAKNESS 11/09/2007    Past Surgical History:  Procedure Laterality Date  . CARDIAC CATHETERIZATION    . CATARACT EXTRACTION    . CHOLECYSTECTOMY    . ENDARTERECTOMY Left 11/13/2015   Procedure: LEFT CAROTID ARTERY ENDARTERECTOMY;  Surgeon: Conrad Wallace, MD;  Location: [facility];  Service: Vascular;  Laterality: Left;  . KNEE SURGERY    . PATCH ANGIOPLASTY Left 11/13/2015   Procedure: WITH 1CM X 6CM  XENOSURE BIOLOGIC PATCH ANGIOPLASTY;  Surgeon: Conrad Cheney, MD;  Location: [facility];  Service: Vascular;  Laterality: Left;  . TEE WITHOUT CARDIOVERSION N/A 06/24/2014   Procedure: TRANSESOPHAGEAL ECHOCARDIOGRAM (TEE);  Surgeon: Sanda Klein, MD;  Location: [facility] ENDOSCOPY;  Service: Cardiovascular;  Laterality: N/A;    Family History  Problem Relation Age of Onset  . Cancer Maternal Grandmother     stomach    Social History   Social History  . Marital status: Divorced    Spouse name: N/A  . Number of children: 4  . Years of education: college   Occupational History  . N/A    Social History Main Topics  . Smoking status: Former Smoker    Quit date: 07/08/1978  . Smokeless tobacco: Never Used  . Alcohol use 4.2 oz/week    7 Glasses of wine per week     Comment: "red wine"  . Drug use: No  . Sexual activity: Not on file   Other Topics Concern  . Not on file   Social History Narrative      Patient is right handed.   Patient drinks 1 cup caffeine daily.     Review of systems: Review of Systems  Constitutional: Negative for fever and chills.  HENT: Negative.   Eyes: Negative for blurred vision.  Respiratory: as per HPI  Cardiovascular: Negative for chest pain and palpitations.  Gastrointestinal: Negative for vomiting, diarrhea, blood per rectum. Genitourinary: Negative for dysuria, urgency, frequency and hematuria.  Musculoskeletal: Negative for myalgias, back pain and joint pain.  Skin: Negative for itching and rash.  Neurological: Negative for dizziness,  tremors, focal weakness, seizures and loss of consciousness.  Endo/Heme/Allergies: Negative for environmental allergies.  Psychiatric/Behavioral: Negative for depression, suicidal ideas and hallucinations.  All other systems reviewed and are negative.   Physical Exam: Blood pressure (!) 150/60, pulse 88, height 5\' 6"  (1.676 m), SpO2 96 %. Gen:      No acute distress HEENT:  EOMI, sclera anicteric Neck:     No masses; no thyromegaly Lungs:    Clear to auscultation bilaterally; normal respiratory effort CV:         Regular rate and rhythm; no murmurs Abd:      + bowel sounds; soft, non-tender; no palpable masses, no distension Ext:    No edema; adequate peripheral perfusion Skin:      Warm and dry; no rash Neuro: alert and oriented x 3 Psych: normal mood and affect  Data Reviewed: CXR 12/11/15- Nodular opacity in left mid lung, hyperexpanded lung, images reviewed  CT scan chest 11/26/12- Right lingular opacity, scattered sub cm pulmonary nodules,  images reviewed.  CT 04/05/16- Stable pulmonary nodules Severe COPD. Images reviewed  PFTs 04/02/16 FVC 2.54 (107%) FEV1 1.43 (82%) F/F 56 TLC 107% DLCO 42% Mod obstructive lung disease with DLCO impairment.  DATA from [place], [place] CT of neck 04/09/16- no CT findings to suggest an acute soft tissue abnormality,  subtle asymmetric prominence of right sub-lingual tonsil. Asymmetric prominence of left vocal cord Multiple thyroid lesions Relative ectasia of left carotid bulb Moderate to advanced degenerative arthritis and disc disease  CT of the brain 04/09/16- no CT findings for acute intracranial abnormality. Minimal changes of aging.  Chest x-ray 04/09/16- no acute cardiac pulmonary disease. Emphysema.  EKG 04/09/16- sinus tachycardia, incomplete left bundle branch block, LVH, anterior Q waves  Assessment:  #1 Moderate COPD. Although she has moderate obstruction on PFTs her COPD is likely severe based on  lung imaging and DLCO impairment. She is doing well on triple inhaler therapy although she feels that her co-pay is high. I'll try to switch to Trelegy inhaler to consolidate her therapy and reduce co-pay  I suspect she has difficulty clearing secretions due to the vocal cord paralysis. She has a flutter valve and incentive spirometer. I have encouraged her to use them at least 3 times daily.   #2 Lung nodule Stable on repeat CT. Likely benign.  Plan/Recommendations: - Use trelegy instead of Symbicort and spiriva - Use incentive spirometer and flutter valve 3 times/ day.  Marshell Garfinkel MD [facility] Pager [phone] If no answer or after 3pm call: [phone] 08/06/2016, 10:04 AM  CC: Marletta Lor, MD

## 2016-08-07 ENCOUNTER — Telehealth: Payer: Self-pay | Admitting: Pulmonary Disease

## 2016-08-07 NOTE — Telephone Encounter (Signed)
Pt's insurance is not covering Trelegy. PA has been started on Cover My Meds. Key: [id number]. PA determination could take up to 72 hours. Will await PA decision.

## 2016-08-14 ENCOUNTER — Other Ambulatory Visit: Payer: Self-pay

## 2016-08-14 NOTE — Telephone Encounter (Signed)
Checked status of PA on [email or web address], received following message:  Outcome  N/Aon January 31  This medication is on your plan's list of covered drugs. Prior authorization is not required at this time. If your pharmacy has questions regarding the processing of your prescription, please have them call the [facility] pharmacy help desk at [phone]. For additional information, the member can contact Member Services by calling the number on the back of their ID Card. ------------- [facility], pharmacist verified claim running through insurance.  Nothing further needed.

## 2016-08-27 ENCOUNTER — Other Ambulatory Visit: Payer: Self-pay | Admitting: Physician Assistant

## 2016-09-18 ENCOUNTER — Telehealth: Payer: Self-pay | Admitting: Pulmonary Disease

## 2016-09-18 NOTE — Telephone Encounter (Signed)
Patient is returning phone call. She is worried and request to speak with someone asap.

## 2016-09-18 NOTE — Telephone Encounter (Signed)
Spoke with pt. She is upset and frantic that she can't get Trelegy refilled. [facility], Trelegy is needing a PA. Called [facility] at [phone]. Pt's ID: [id number]. Spoke with Ebony at [facility]. Trelegy is non preferred on pt's insurance plan. Covered alternatives are Anoro, Bevespi and Stiolto.  PM - please advise. Thanks.

## 2016-09-18 NOTE — Telephone Encounter (Signed)
Attempted to call pt. Received busy signal x2. Will try back later.

## 2016-09-19 MED ORDER — FLUTICASONE-UMECLIDIN-VILANT 100-62.5-25 MCG/INH IN AEPB: 1.0000 | INHALATION_SPRAY | Freq: Every day | RESPIRATORY_TRACT | 0 refills | Status: DC

## 2016-09-19 NOTE — Telephone Encounter (Signed)
lmomtcb x1 

## 2016-09-19 NOTE — Telephone Encounter (Signed)
Can you give samples of trelegy and coupon for her. Can we file a PA for her in the meantime

## 2016-09-19 NOTE — Telephone Encounter (Signed)
Patient called requesting samples of Trelegy - she can be reached at [phone] -pr

## 2016-09-19 NOTE — Telephone Encounter (Signed)
Pt walked in and I gave her 2 samples of trelegy  I called for PA [phone]  PA initiated- I advised that pt has already tried advair, spiriva, symbicort and stiolto  PA number is PA- [id number]  Will forward to [facility] to f/u on approval/denial

## 2016-09-20 ENCOUNTER — Telehealth: Payer: Self-pay | Admitting: Pulmonary Disease

## 2016-09-20 NOTE — Telephone Encounter (Signed)
Called [facility], answered further clinical info needed for PA for Trelegy.  PA is currently under clinical review, and response will be sent via fax to our office- verified fax #.  Nothing further needed at this time.

## 2016-09-23 NOTE — Telephone Encounter (Signed)
Called to check status of PA, PA was denied.  Pt must try and fail either anoro or bevespi  (stiolto, anoro, and bevespi are preferred alternatives- she must try and fail 2 preferred meds before trelegy will be covered).  PM please advise what medication you'd prefer pt to switch to.  Thanks.

## 2016-09-25 ENCOUNTER — Telehealth: Payer: Self-pay | Admitting: Pulmonary Disease

## 2016-09-25 MED ORDER — UMECLIDINIUM-VILANTEROL 62.5-25 MCG/INH IN AEPB: 1.0000 | INHALATION_SPRAY | Freq: Every day | RESPIRATORY_TRACT | 5 refills | Status: DC

## 2016-09-25 NOTE — Telephone Encounter (Signed)
Try Anoro first. Thanks

## 2016-09-25 NOTE — Telephone Encounter (Signed)
Called and spoke with pt and she stated that her insurance will not cover the trelegy due to step therapy requirements:  She must try and fail 2 of the following:  stiolto, anoro and bevespi.    PM please advise. thanks

## 2016-09-25 NOTE — Telephone Encounter (Signed)
Called and spoke to pt. Informed her of the med change. Rx sent to preferred pharmacy. Pt verbalized understanding and denied any further questions or concerns at this time.

## 2016-09-27 NOTE — Telephone Encounter (Signed)
Dr. Vaughan Browner please advise on preferred alternative.  Thank you.

## 2016-09-27 NOTE — Telephone Encounter (Signed)
Will close encounter

## 2016-09-27 NOTE — Telephone Encounter (Signed)
We are trying anoro. Prescription has been already sent in. Thanks

## 2016-10-04 ENCOUNTER — Ambulatory Visit: Payer: Medicare Other | Admitting: Vascular Surgery

## 2016-10-04 ENCOUNTER — Encounter (HOSPITAL_COMMUNITY): Payer: Medicare Other

## 2016-10-05 ENCOUNTER — Other Ambulatory Visit: Payer: Self-pay | Admitting: Internal Medicine

## 2016-10-09 ENCOUNTER — Encounter: Payer: Self-pay | Admitting: Adult Health

## 2016-10-09 ENCOUNTER — Encounter (HOSPITAL_COMMUNITY): Payer: Self-pay | Admitting: *Deleted

## 2016-10-09 ENCOUNTER — Ambulatory Visit (INDEPENDENT_AMBULATORY_CARE_PROVIDER_SITE_OTHER): Payer: Medicare Other | Admitting: Adult Health

## 2016-10-09 ENCOUNTER — Ambulatory Visit (INDEPENDENT_AMBULATORY_CARE_PROVIDER_SITE_OTHER): Payer: Medicare Other

## 2016-10-09 ENCOUNTER — Inpatient Hospital Stay (HOSPITAL_COMMUNITY)
Admission: EM | Admit: 2016-10-09 | Discharge: 2016-10-12 | DRG: 378 | Disposition: A | Discharge status: Home / Self Care | Payer: Medicare Other | Attending: Family Medicine | Admitting: Family Medicine

## 2016-10-09 VITALS — BP 142/60 | HR 98 | Temp 98.0°F | Ht 66.0 in | Wt 133.0 lb

## 2016-10-09 DIAGNOSIS — D509 Iron deficiency anemia, unspecified: Secondary | ICD-10-CM | POA: Diagnosis present

## 2016-10-09 DIAGNOSIS — Z888 Allergy status to other drugs, medicaments and biological substances status: Secondary | ICD-10-CM

## 2016-10-09 DIAGNOSIS — I10 Essential (primary) hypertension: Secondary | ICD-10-CM

## 2016-10-09 DIAGNOSIS — K31811 Angiodysplasia of stomach and duodenum with bleeding: Secondary | ICD-10-CM | POA: Diagnosis not present

## 2016-10-09 DIAGNOSIS — Z86010 Personal history of colonic polyps: Secondary | ICD-10-CM

## 2016-10-09 DIAGNOSIS — Z87891 Personal history of nicotine dependence: Secondary | ICD-10-CM | POA: Diagnosis not present

## 2016-10-09 DIAGNOSIS — E039 Hypothyroidism, unspecified: Secondary | ICD-10-CM | POA: Diagnosis present

## 2016-10-09 DIAGNOSIS — R0602 Shortness of breath: Secondary | ICD-10-CM

## 2016-10-09 DIAGNOSIS — Z8673 Personal history of transient ischemic attack (TIA), and cerebral infarction without residual deficits: Secondary | ICD-10-CM

## 2016-10-09 DIAGNOSIS — E785 Hyperlipidemia, unspecified: Secondary | ICD-10-CM | POA: Diagnosis present

## 2016-10-09 DIAGNOSIS — J449 Chronic obstructive pulmonary disease, unspecified: Secondary | ICD-10-CM | POA: Diagnosis not present

## 2016-10-09 DIAGNOSIS — F419 Anxiety disorder, unspecified: Secondary | ICD-10-CM | POA: Diagnosis not present

## 2016-10-09 DIAGNOSIS — K922 Gastrointestinal hemorrhage, unspecified: Secondary | ICD-10-CM | POA: Diagnosis present

## 2016-10-09 DIAGNOSIS — K5730 Diverticulosis of large intestine without perforation or abscess without bleeding: Secondary | ICD-10-CM | POA: Diagnosis not present

## 2016-10-09 DIAGNOSIS — R5383 Other fatigue: Secondary | ICD-10-CM

## 2016-10-09 DIAGNOSIS — Z7902 Long term (current) use of antithrombotics/antiplatelets: Secondary | ICD-10-CM | POA: Diagnosis not present

## 2016-10-09 DIAGNOSIS — Z7951 Long term (current) use of inhaled steroids: Secondary | ICD-10-CM | POA: Diagnosis not present

## 2016-10-09 DIAGNOSIS — N189 Chronic kidney disease, unspecified: Secondary | ICD-10-CM | POA: Diagnosis not present

## 2016-10-09 DIAGNOSIS — Z79899 Other long term (current) drug therapy: Secondary | ICD-10-CM | POA: Diagnosis not present

## 2016-10-09 DIAGNOSIS — K921 Melena: Secondary | ICD-10-CM | POA: Diagnosis not present

## 2016-10-09 DIAGNOSIS — D649 Anemia, unspecified: Secondary | ICD-10-CM | POA: Diagnosis not present

## 2016-10-09 DIAGNOSIS — N183 Chronic kidney disease, stage 3 (moderate): Secondary | ICD-10-CM | POA: Diagnosis present

## 2016-10-09 DIAGNOSIS — K222 Esophageal obstruction: Secondary | ICD-10-CM | POA: Diagnosis present

## 2016-10-09 DIAGNOSIS — N179 Acute kidney failure, unspecified: Secondary | ICD-10-CM | POA: Diagnosis present

## 2016-10-09 DIAGNOSIS — I129 Hypertensive chronic kidney disease with stage 1 through stage 4 chronic kidney disease, or unspecified chronic kidney disease: Secondary | ICD-10-CM | POA: Diagnosis not present

## 2016-10-09 DIAGNOSIS — D500 Iron deficiency anemia secondary to blood loss (chronic): Secondary | ICD-10-CM | POA: Diagnosis not present

## 2016-10-09 DIAGNOSIS — D638 Anemia in other chronic diseases classified elsewhere: Secondary | ICD-10-CM

## 2016-10-09 DIAGNOSIS — Z7982 Long term (current) use of aspirin: Secondary | ICD-10-CM | POA: Diagnosis not present

## 2016-10-09 DIAGNOSIS — R002 Palpitations: Secondary | ICD-10-CM | POA: Diagnosis not present

## 2016-10-09 LAB — COMPREHENSIVE METABOLIC PANEL
ALK PHOS: 102 U/L (ref 38–126)
ALT: 18 U/L (ref 14–54)
ANION GAP: 9 (ref 5–15)
AST: 21 U/L (ref 15–41)
Albumin: 3.8 g/dL (ref 3.5–5.0)
BILIRUBIN TOTAL: 0.4 mg/dL (ref 0.3–1.2)
BUN: 26 mg/dL — ABNORMAL HIGH (ref 6–20)
CALCIUM: 8.8 mg/dL — AB (ref 8.9–10.3)
CO2: 23 mmol/L (ref 22–32)
CREATININE: 1.36 mg/dL — AB (ref 0.44–1.00)
Chloride: 103 mmol/L (ref 101–111)
GFR, EST AFRICAN AMERICAN: 39 mL/min — AB (ref >60)
GFR, EST NON AFRICAN AMERICAN: 33 mL/min — AB (ref >60)
Glucose, Bld: 123 mg/dL — ABNORMAL HIGH (ref 65–99)
Potassium: 3.8 mmol/L (ref 3.5–5.1)
Sodium: 135 mmol/L (ref 135–145)
TOTAL PROTEIN: 6.5 g/dL (ref 6.5–8.1)

## 2016-10-09 LAB — CBC WITH DIFFERENTIAL/PLATELET
BASOS ABS: 0.1 10*3/uL (ref 0.0–0.1)
BASOS PCT: 1.1 % (ref 0.0–3.0)
Basophils Absolute: 0.1 10*3/uL (ref 0.0–0.1)
Basophils Relative: 1 %
EOS ABS: 0.1 10*3/uL (ref 0.0–0.7)
EOS PCT: 2.7 % (ref 0.0–5.0)
Eosinophils Absolute: 0.1 10*3/uL (ref 0.0–0.7)
Eosinophils Relative: 2 %
HCT: 14.6 % — CL (ref 36.0–46.0)
HCT: 13.6 % — ABNORMAL LOW (ref 36.0–46.0)
Hemoglobin: 4.6 g/dL — CL (ref 12.0–15.0)
Hemoglobin: 4.1 g/dL — CL (ref 12.0–15.0)
LYMPHS ABS: 0.8 10*3/uL (ref 0.7–4.0)
Lymphocytes Relative: 16.8 % (ref 12.0–46.0)
Lymphocytes Relative: 16 %
Lymphs Abs: 0.8 10*3/uL (ref 0.7–4.0)
MCH: 21.7 pg — ABNORMAL LOW (ref 26.0–34.0)
MCHC: 31.4 g/dL (ref 30.0–36.0)
MCHC: 30.1 g/dL (ref 30.0–36.0)
MCV: 72 fL — ABNORMAL LOW (ref 78.0–100.0)
MONO ABS: 0.6 10*3/uL (ref 0.1–1.0)
MONO ABS: 0.6 10*3/uL (ref 0.1–1.0)
MONOS PCT: 11 %
Monocytes Relative: 11.6 % (ref 3.0–12.0)
NEUTROS PCT: 67.8 % (ref 43.0–77.0)
Neutro Abs: 3.3 10*3/uL (ref 1.4–7.7)
Neutro Abs: 3.4 10*3/uL (ref 1.7–7.7)
Neutrophils Relative %: 70 %
PLATELETS: 276 10*3/uL (ref 150–400)
Platelets: 300 10*3/uL (ref 150.0–400.0)
RBC: 2.09 Mil/uL — ABNORMAL LOW (ref 3.87–5.11)
RBC: 1.89 MIL/uL — AB (ref 3.87–5.11)
RDW: 17.8 % — AB (ref 11.5–15.5)
RDW: 17.8 % — AB (ref 11.5–15.5)
WBC: 4.8 10*3/uL (ref 4.0–10.5)
WBC: 5 10*3/uL (ref 4.0–10.5)

## 2016-10-09 LAB — IRON AND TIBC
IRON: 12 ug/dL — AB (ref 28–170)
SATURATION RATIOS: 3 % — AB (ref 10.4–31.8)
TIBC: 382 ug/dL (ref 250–450)
UIBC: 370 ug/dL

## 2016-10-09 LAB — TSH: TSH: 2.69 u[IU]/mL (ref 0.35–4.50)

## 2016-10-09 LAB — BASIC METABOLIC PANEL
BUN: 23 mg/dL (ref 6–23)
CALCIUM: 8.8 mg/dL (ref 8.4–10.5)
CO2: 26 meq/L (ref 19–32)
CREATININE: 1.33 mg/dL — AB (ref 0.40–1.20)
Chloride: 103 mEq/L (ref 96–112)
GFR: 39.9 mL/min — ABNORMAL LOW (ref >60.00)
Glucose, Bld: 138 mg/dL — ABNORMAL HIGH (ref 70–99)
Potassium: 4.1 mEq/L (ref 3.5–5.1)
Sodium: 136 mEq/L (ref 135–145)

## 2016-10-09 LAB — VITAMIN B12: Vitamin B-12: 326 pg/mL (ref 180–914)

## 2016-10-09 LAB — POC OCCULT BLOOD, ED: Fecal Occult Bld: POSITIVE — AB

## 2016-10-09 LAB — RETICULOCYTES
RBC.: 1.89 MIL/uL — ABNORMAL LOW (ref 3.87–5.11)
RETIC CT PCT: 2.9 % (ref 0.4–3.1)
Retic Count, Absolute: 54.8 10*3/uL (ref 19.0–186.0)

## 2016-10-09 LAB — PREPARE RBC (CROSSMATCH)

## 2016-10-09 LAB — ABO/RH: ABO/RH(D): A NEG

## 2016-10-09 LAB — FERRITIN: FERRITIN: 5 ng/mL — AB (ref 11–307)

## 2016-10-09 LAB — FOLATE: Folate: 11 ng/mL (ref >5.9)

## 2016-10-09 MED ORDER — SODIUM CHLORIDE 0.9 % IV SOLN
Freq: Once | INTRAVENOUS | Status: AC
  Administered 2016-10-09: 23:00:00 via INTRAVENOUS

## 2016-10-09 MED ORDER — AMLODIPINE BESYLATE 10 MG PO TABS
10.0000 mg | ORAL_TABLET | Freq: Every day | ORAL | Status: DC
  Administered 2016-10-10 – 2016-10-12 (×3): 10 mg via ORAL
  Filled 2016-10-09: qty 1
  Filled 2016-10-09: qty 2
  Filled 2016-10-09: qty 1

## 2016-10-09 MED ORDER — UMECLIDINIUM-VILANTEROL 62.5-25 MCG/INH IN AEPB
1.0000 | INHALATION_SPRAY | Freq: Every day | RESPIRATORY_TRACT | Status: DC
  Administered 2016-10-10 – 2016-10-12 (×2): 1 via RESPIRATORY_TRACT
  Filled 2016-10-09 (×3): qty 14

## 2016-10-09 MED ORDER — ONDANSETRON HCL 4 MG PO TABS: 4.0000 mg | ORAL_TABLET | Freq: Four times a day (QID) | ORAL | Status: DC | PRN

## 2016-10-09 MED ORDER — SODIUM CHLORIDE 0.9 % IV SOLN
Freq: Once | INTRAVENOUS | Status: AC
  Administered 2016-10-09: 18:00:00 via INTRAVENOUS

## 2016-10-09 MED ORDER — PANTOPRAZOLE SODIUM 40 MG IV SOLR
40.0000 mg | Freq: Two times a day (BID) | INTRAVENOUS | Status: DC
  Administered 2016-10-10 – 2016-10-12 (×5): 40 mg via INTRAVENOUS
  Filled 2016-10-09 (×5): qty 40

## 2016-10-09 MED ORDER — ALPRAZOLAM 0.5 MG PO TABS: 0.5000 mg | ORAL_TABLET | Freq: Every day | ORAL | Status: DC | PRN

## 2016-10-09 MED ORDER — ONDANSETRON HCL 4 MG/2ML IJ SOLN: 4.0000 mg | Freq: Four times a day (QID) | INTRAMUSCULAR | Status: DC | PRN

## 2016-10-09 MED ORDER — BOOST / RESOURCE BREEZE PO LIQD
1.0000 | Freq: Three times a day (TID) | ORAL | Status: DC
  Administered 2016-10-10 – 2016-10-12 (×8): 1 via ORAL

## 2016-10-09 MED ORDER — HYDRALAZINE HCL 50 MG PO TABS
75.0000 mg | ORAL_TABLET | Freq: Three times a day (TID) | ORAL | Status: DC
  Administered 2016-10-09 – 2016-10-12 (×8): 75 mg via ORAL
  Filled 2016-10-09 (×8): qty 1

## 2016-10-09 MED ORDER — LEVOTHYROXINE SODIUM 75 MCG PO TABS
75.0000 ug | ORAL_TABLET | Freq: Every day | ORAL | Status: DC
  Administered 2016-10-10 – 2016-10-12 (×3): 75 ug via ORAL
  Filled 2016-10-09 (×3): qty 1

## 2016-10-09 MED ORDER — ACETAMINOPHEN 650 MG RE SUPP: 650.0000 mg | Freq: Four times a day (QID) | RECTAL | Status: DC | PRN

## 2016-10-09 MED ORDER — PANTOPRAZOLE SODIUM 40 MG IV SOLR
40.0000 mg | Freq: Once | INTRAVENOUS | Status: AC
  Administered 2016-10-09: 40 mg via INTRAVENOUS
  Filled 2016-10-09: qty 40

## 2016-10-09 MED ORDER — GUAIFENESIN-DM 100-10 MG/5ML PO SYRP: 5.0000 mL | ORAL_SOLUTION | ORAL | Status: DC | PRN

## 2016-10-09 MED ORDER — ACETAMINOPHEN 325 MG PO TABS: 650.0000 mg | ORAL_TABLET | Freq: Four times a day (QID) | ORAL | Status: DC | PRN

## 2016-10-09 MED ORDER — FUROSEMIDE 10 MG/ML IJ SOLN
20.0000 mg | Freq: Once | INTRAMUSCULAR | Status: AC
  Administered 2016-10-10: 20 mg via INTRAVENOUS
  Filled 2016-10-09: qty 2

## 2016-10-09 NOTE — ED Provider Notes (Signed)
[facility] DEPT Provider Note   CSN: [id number] Arrival date & time: 10/09/16  1647     History   Chief Complaint Chief Complaint  Patient presents with  . Abnormal Lab    low hgb    HPI [name] is a 83 y.o. female.  The history is provided by the patient. No language interpreter was used.  Abnormal Lab   [name] is a 83 y.o. female who presents to the Emergency Department complaining of abnormal lab.  She presents for evaluation of low hemoglobin. She saw her family doctor because she was having increased shortness of breath, dizziness, exertional dyspnea and generalized weakness. She almost passed out yesterday. Symptoms have been ongoing for the last few days. No chest pain, abdominal pain. She does have very dark stools for the last few days and this is new for her. She takes Plavixhistory of GI bleeding in the past. Her PCP performed a CBC and her hemoglobin was 4.6. She was notified and told to present to the emergency department for further evaluation. Past Medical History:  Diagnosis Date  . Anxiety   . Aortic arch atherosclerosis ([place]) 06/24/2014  . Atherosclerotic ulcer of aorta ([place]) 06/24/2014  . [place] DISEASE, LUMBAR 12/16/2008  . DIVERTICULOSIS, COLON 09/30/2008  . DYSPNEA 07/13/2008  . Graves disease   . History of embolic stroke 9/62/2297   Left brain  . HYPERLIPIDEMIA 03/06/2007  . HYPERTENSION 03/06/2007  . HYPOTHYROIDISM 10/13/2007  . OSTEOARTHRITIS 03/06/2007  . Personal history of colonic polyps 09/30/2008  . Stroke ([place])    06/2014           . TIA (transient ischemic attack)   . TOBACCO USE, QUIT 04/12/2009  . WEAKNESS 11/09/2007    Patient Active Problem List   Diagnosis Date Noted  . GI bleed 10/09/2016  . Other emphysema ([place]) 05/10/2016  . Recurrent laryngeal neuropathy 02/01/2016  . Nausea with vomiting 12/11/2015  . Left carotid stenosis 11/21/2015  . Asymptomatic carotid artery stenosis 11/13/2015  . Impaired glucose tolerance  10/27/2015  . Solitary pulmonary nodule 10/27/2015  . Thyroid nodule 10/27/2015  . Syncope 10/04/2015  . Bradycardia with less than 60 beats per minute 10/04/2015  . Paresthesia 10/03/2014  . History of embolic stroke 98/92/1194  . Paresthesia of both feet 07/06/2014  . Aortic arch atherosclerosis ([place]) 06/24/2014  . Atherosclerotic ulcer of aorta ([place]) 06/24/2014  . Stroke ([place]) 06/22/2014  . CVA (cerebral infarction) 06/22/2014  . Bilateral carotid artery disease ([place]) 05/18/2014  . TOBACCO USE, QUIT 04/12/2009  . [place] DISEASE, LUMBAR 12/16/2008  . DIVERTICULOSIS, COLON 09/30/2008  . Personal history of colonic polyps 09/30/2008  . DYSPNEA 07/13/2008  . Weakness 11/09/2007  . Hypothyroidism 10/13/2007  . Hyperlipidemia 03/06/2007  . Essential hypertension 03/06/2007  . Osteoarthritis 03/06/2007    Past Surgical History:  Procedure Laterality Date  . CARDIAC CATHETERIZATION    . CATARACT EXTRACTION    . CHOLECYSTECTOMY    . ENDARTERECTOMY Left 11/13/2015   Procedure: LEFT CAROTID ARTERY ENDARTERECTOMY;  Surgeon: Conrad [place], MD;  Location: [facility];  Service: Vascular;  Laterality: Left;  . KNEE SURGERY    . PATCH ANGIOPLASTY Left 11/13/2015   Procedure: WITH 1CM X 6CM  XENOSURE BIOLOGIC PATCH ANGIOPLASTY;  Surgeon: Conrad [place]boro, MD;  Location: [facility];  Service: Vascular;  Laterality: Left;  . TEE WITHOUT CARDIOVERSION N/A 06/24/2014   Procedure: TRANSESOPHAGEAL ECHOCARDIOGRAM (TEE);  Surgeon: Sanda Klein, MD;  Location: [facility];  Service: Cardiovascular;  Laterality: N/A;    OB History    No data available       Home Medications    Prior to Admission medications   Medication Sig Start Date End Date Taking? Authorizing Provider  ALPRAZolam Duanne Moron) 0.5 MG tablet Take 1 tablet (0.5 mg total) by mouth daily as needed for anxiety. 10/09/15  Yes Marletta Lor, MD  amLODipine (NORVASC) 10 MG tablet Take 1 tablet (10 mg total) by mouth daily. 02/14/16  Yes Marletta Lor, MD  aspirin EC 81 MG tablet Take 1 tablet (81 mg total) by mouth daily. 11/23/15  Yes Alvia Grove, PA-C  atorvastatin (LIPITOR) 80 MG tablet Take 80 mg by mouth daily.   Yes Historical Provider, MD  clopidogrel (PLAVIX) 75 MG tablet Take 1 tablet (75 mg total) by mouth daily. 02/14/16  Yes Marletta Lor, MD  Fluticasone-Umeclidin-Vilant (TRELEGY ELLIPTA) 100-62.5-25 MCG/INH AEPB Inhale 1 puff into the lungs daily. 09/19/16  Yes Praveen Mannam, MD  hydrALAZINE (APRESOLINE) 25 MG tablet Take 3 tablets (75 mg total) by mouth 3 (three) times daily. 06/18/16  Yes Eileen Stanford, PA-C  levothyroxine (SYNTHROID, LEVOTHROID) 75 MCG tablet TAKE 1 TABLET BY MOUTH EVERY DAY 10/07/16  Yes Marletta Lor, MD  umeclidinium-vilanterol [facility] ELLIPTA) 62.5-25 MCG/INH AEPB Inhale 1 puff into the lungs daily. 09/25/16  Yes Praveen Mannam, MD  budesonide-formoterol (SYMBICORT) 80-4.5 MCG/ACT inhaler Inhale 2 puffs into the lungs 2 (two) times daily. Patient not taking: Reported on 10/09/2016 05/01/16   Marshell Garfinkel, MD  budesonide-formoterol (SYMBICORT) 80-4.5 MCG/ACT inhaler Inhale 2 puffs into the lungs 2 (two) times daily. Patient not taking: Reported on 10/09/2016 05/01/16   Marshell Garfinkel, MD  fluticasone (FLONASE) 50 MCG/ACT nasal spray Place into both nostrils daily.    Historical Provider, MD  Fluticasone-Umeclidin-Vilant (TRELEGY ELLIPTA) 100-62.5-25 MCG/INH AEPB Inhale 1 puff into the lungs daily. Patient not taking: Reported on 10/09/2016 08/06/16   Marshell Garfinkel, MD  hydrALAZINE (APRESOLINE) 50 MG tablet Take 1.5 tablets (75 mg total) by mouth 3 (three) times daily. Patient not taking: Reported on 10/09/2016 11/17/15   Clayborne Dana, MD  PROAIR HFA 108 [phone] Base) MCG/ACT inhaler INHALE 2 PUFFS INTO THE LUNGS EVERY 6 (SIX) HOURS AS NEEDED FOR WHEEZING OR SHORTNESS OF BREATH. 04/02/16   Marletta Lor, MD  Tiotropium Bromide Monohydrate (SPIRIVA RESPIMAT) 2.5 MCG/ACT AERS Inhale 2  puffs into the lungs daily. Patient not taking: Reported on 10/09/2016 05/01/16   Marshell Garfinkel, MD    Family History Family History  Problem Relation Age of Onset  . Cancer Maternal Grandmother     stomach    Social History Social History  Substance Use Topics  . Smoking status: Former Smoker    Quit date: 07/08/1978  . Smokeless tobacco: Never Used  . Alcohol use 4.2 oz/week    7 Glasses of wine per week     Comment: "red wine"     Allergies   Catapres [clonidine hcl]; Lisinopril; and Labetalol hcl   Review of Systems Review of Systems  All other systems reviewed and are negative.    Physical Exam Updated Vital Signs BP (!) 160/61 (BP Location: Left Arm)   Pulse 88   Temp 97.7 F (36.5 C) (Oral)   Resp 18   Ht 5\' 6"  (1.676 m)   Wt 133 lb (60.3 kg)   SpO2 97%   BMI 21.47 kg/m   Physical Exam  Constitutional: She is oriented to person, place, and  time. She appears well-developed and well-nourished.  HENT:  Head: Normocephalic and atraumatic.  Cardiovascular: Regular rhythm.   Tachycardic with faint systolic ejection murmur  Pulmonary/Chest: Effort normal and breath sounds normal. No respiratory distress.  Abdominal: Soft. There is no tenderness. There is no rebound and no guarding.  Genitourinary:  Genitourinary Comments: Nontender rectal exam. Dark stool in rectal vault.  Musculoskeletal: She exhibits no edema or tenderness.  Neurological: She is alert and oriented to person, place, and time.  Skin: Skin is warm and dry. There is pallor.  Psychiatric: She has a normal mood and affect. Her behavior is normal.  Nursing note and vitals reviewed.    ED Treatments / Results  Labs (all labs ordered are listed, but only abnormal results are displayed) Labs Reviewed  POC OCCULT BLOOD, ED - Abnormal; Notable for the following:       Result Value   Fecal Occult Bld POSITIVE (*)    All other components within normal limits  VITAMIN B12  FOLATE  IRON AND  TIBC  FERRITIN  RETICULOCYTES  COMPREHENSIVE METABOLIC PANEL  TYPE AND SCREEN  PREPARE RBC (CROSSMATCH)    EKG  EKG Interpretation  Date/Time:  Wednesday October 09 2016 17:13:19 EDT Ventricular Rate:  88 PR Interval:    QRS Duration: 117 QT Interval:  422 QTC Calculation: 511 R Axis:   48 Text Interpretation:  Sinus rhythm LVH with secondary repolarization abnormality Anterior infarct, old Prolonged QT interval Confirmed by Hazle Coca [phone]) on 10/09/2016 5:18:14 PM       Radiology Dg Chest 2 View  Result Date: 10/09/2016 CLINICAL DATA:  Shortness of breath over the past 2-3 days especially with activity. History of COPD, former smoker. EXAM: CHEST  2 VIEW COMPARISON:  Chest x-ray of December 11, 2015 and chest CT scan of April 05, 2016. FINDINGS: The lungs are hyperinflated with hemidiaphragm flattening. There is subtle increased density peripherally in the left upper lung. There is hazy increased density in the right infrahilar region which is new. There is chronic blunting of the costophrenic angles. Subtle nodularity on the left inferior to the larger area of consolidation is stable. The heart and pulmonary vascularity are normal. There is calcification in the wall of the aortic arch. The bony thorax exhibits no acute abnormality. IMPRESSION: COPD. Hazy increased density peripherally in the left mid lung may reflect pneumonia. Coarse right infrahilar lung markings are consistent with atelectasis or early interstitial pneumonia. Followup PA and lateral chest X-ray is recommended in 3-4 weeks following trial of antibiotic therapy to ensure resolution and exclude underlying malignancy. Stable subcentimeter nodules in the left mid lung. Thoracic aortic atherosclerosis. Electronically Signed   By: David  Martinique M.D.   On: 10/09/2016 15:18    Procedures Procedures (including critical care time)  Medications Ordered in ED Medications  0.9 %  sodium chloride infusion ( Intravenous New  Bag/Given 10/09/16 1739)  pantoprazole (PROTONIX) injection 40 mg (40 mg Intravenous Given 10/09/16 1739)     Initial Impression / Assessment and Plan / ED Course  I have reviewed the triage vital signs and the nursing notes.  Pertinent labs & imaging results that were available during my care of the patient were reviewed by me and considered in my medical decision making (see chart for details).     Patient here for evaluation of low hemoglobin, generalized weakness. She does have dark stool on rectal exam, heme-positive. Plan to transfuse 2 units for symptomatic anemia, likely GI bleed. She was given  1 dose of Protonix for possible upper GI bleed. Hospitalist consultation for admission for further treatment.  Final Clinical Impressions(s) / ED Diagnoses   Final diagnoses:  Symptomatic anemia  Acute GI bleeding    New Prescriptions New Prescriptions   No medications on file     Quintella Reichert, MD 10/09/16 1744

## 2016-10-09 NOTE — Patient Instructions (Signed)
I am going to get blood work and chest xray today   [place], [place], [place] [address]

## 2016-10-09 NOTE — ED Notes (Signed)
Attempted to take patient to restroom with steady. Pt reported feeling as if she was going to pass out and placed back into bed.

## 2016-10-09 NOTE — ED Notes (Signed)
Bed: KK44 Expected date:  Expected time:  Means of arrival:  Comments: 83 yo f low hgb

## 2016-10-09 NOTE — Progress Notes (Signed)
Subjective:    Patient ID: [name], female    DOB: Nov 07, 1924, 83 y.o.   MRN: [id number]  HPI  83 year old female who  has a past medical history of Anxiety; Aortic arch atherosclerosis ([place]) (06/24/2014); Atherosclerotic ulcer of aorta ([place]) (06/24/2014); [place]C DISEASE, LUMBAR (12/16/2008); DIVERTICULOSIS, COLON (09/30/2008); DYSPNEA (07/13/2008); Graves disease; History of embolic stroke (2/95/1884); HYPERLIPIDEMIA (03/06/2007); HYPERTENSION (03/06/2007); HYPOTHYROIDISM (10/13/2007); OSTEOARTHRITIS (03/06/2007); Personal history of colonic polyps (09/30/2008); Stroke [facility]); TIA (transient ischemic attack); TOBACCO USE, QUIT (04/12/2009); and WEAKNESS (11/09/2007). She is a patient of Dr. Raliegh Ip who I am seeing today in his absence. She presents to the office for the complaints of increasing shortness of breath, dizziness and feeling as though she has weakness in her lower extremities. Symptoms have been present for approximately 3-4 days. He is unable to walk more than 10-15 feet without becoming winded and she has to stop and rest.  Denies any fevers but does feel as though "something is not right eye just don't feel well". She denies any abdominal pain, nausea, vomiting, diarrhea.     Review of Systems See HPI Past Medical History:  Diagnosis Date  . Anxiety   . Aortic arch atherosclerosis ([place]) 06/24/2014  . Atherosclerotic ulcer of aorta ([place]) 06/24/2014  . [place] DISEASE, LUMBAR 12/16/2008  . DIVERTICULOSIS, COLON 09/30/2008  . DYSPNEA 07/13/2008  . Graves disease   . History of embolic stroke 1/66/0630   Left brain  . HYPERLIPIDEMIA 03/06/2007  . HYPERTENSION 03/06/2007  . HYPOTHYROIDISM 10/13/2007  . OSTEOARTHRITIS 03/06/2007  . Personal history of colonic polyps 09/30/2008  . Stroke ([place])    06/2014           . TIA (transient ischemic attack)   . TOBACCO USE, QUIT 04/12/2009  . WEAKNESS 11/09/2007    Social History   Social History  . Marital status: Divorced    Spouse name: N/A  . Number  of children: 4  . Years of education: college   Occupational History  . N/A    Social History Main Topics  . Smoking status: Former Smoker    Quit date: 07/08/1978  . Smokeless tobacco: Never Used  . Alcohol use 4.2 oz/week    7 Glasses of wine per week     Comment: "red wine"  . Drug use: No  . Sexual activity: Not on file   Other Topics Concern  . Not on file   Social History Narrative      Patient is right handed.   Patient drinks 1 cup caffeine daily.    Past Surgical History:  Procedure Laterality Date  . CARDIAC CATHETERIZATION    . CATARACT EXTRACTION    . CHOLECYSTECTOMY    . ENDARTERECTOMY Left 11/13/2015   Procedure: LEFT CAROTID ARTERY ENDARTERECTOMY;  Surgeon: Conrad [place], MD;  Location: [facility];  Service: Vascular;  Laterality: Left;  . KNEE SURGERY    . PATCH ANGIOPLASTY Left 11/13/2015   Procedure: WITH 1CM X 6CM  XENOSURE BIOLOGIC PATCH ANGIOPLASTY;  Surgeon: Conrad [place], MD;  Location: [facility];  Service: Vascular;  Laterality: Left;  . TEE WITHOUT CARDIOVERSION N/A 06/24/2014   Procedure: TRANSESOPHAGEAL ECHOCARDIOGRAM (TEE);  Surgeon: Sanda Klein, MD;  Location: [facility] ENDOSCOPY;  Service: Cardiovascular;  Laterality: N/A;    Family History  Problem Relation Age of Onset  . Cancer Maternal Grandmother     stomach    Allergies  Allergen Reactions  . Catapres [Clonidine Hcl] Other (See Comments)    Made  pt feel horrible, shaky, weak and nausea  . Lisinopril Anaphylaxis    Tongue swelling  . Labetalol Hcl Other (See Comments)    Caused bradycardia and syncope    Current Outpatient Prescriptions on File Prior to Visit  Medication Sig Dispense Refill  . ALPRAZolam (XANAX) 0.5 MG tablet Take 1 tablet (0.5 mg total) by mouth daily as needed for anxiety. 30 tablet 1  . amLODipine (NORVASC) 10 MG tablet Take 1 tablet (10 mg total) by mouth daily. 90 tablet 11  . aspirin EC 81 MG tablet Take 1 tablet (81 mg total) by mouth daily. 30 tablet 0  . atorvastatin  (LIPITOR) 80 MG tablet Take 80 mg by mouth daily.    . budesonide-formoterol (SYMBICORT) 80-4.5 MCG/ACT inhaler Inhale 2 puffs into the lungs 2 (two) times daily. (Patient not taking: Reported on 10/09/2016) 3 Inhaler 3  . budesonide-formoterol (SYMBICORT) 80-4.5 MCG/ACT inhaler Inhale 2 puffs into the lungs 2 (two) times daily. (Patient not taking: Reported on 10/09/2016) 1 Inhaler 0  . clopidogrel (PLAVIX) 75 MG tablet Take 1 tablet (75 mg total) by mouth daily. 90 tablet 10  . fluticasone (FLONASE) 50 MCG/ACT nasal spray Place into both nostrils daily.    . Fluticasone-Umeclidin-Vilant (TRELEGY ELLIPTA) 100-62.5-25 MCG/INH AEPB Inhale 1 puff into the lungs daily. (Patient not taking: Reported on 10/09/2016) 1 each 0  . Fluticasone-Umeclidin-Vilant (TRELEGY ELLIPTA) 100-62.5-25 MCG/INH AEPB Inhale 1 puff into the lungs daily. 1 each 0  . hydrALAZINE (APRESOLINE) 25 MG tablet Take 3 tablets (75 mg total) by mouth 3 (three) times daily. 270 tablet 4  . hydrALAZINE (APRESOLINE) 50 MG tablet Take 1.5 tablets (75 mg total) by mouth 3 (three) times daily. (Patient not taking: Reported on 10/09/2016) 135 tablet 3  . levothyroxine (SYNTHROID, LEVOTHROID) 75 MCG tablet TAKE 1 TABLET BY MOUTH EVERY DAY 90 tablet 1  . PROAIR HFA 108 (90 Base) MCG/ACT inhaler INHALE 2 PUFFS INTO THE LUNGS EVERY 6 (SIX) HOURS AS NEEDED FOR WHEEZING OR SHORTNESS OF BREATH. 8.5 Inhaler 5  . Tiotropium Bromide Monohydrate (SPIRIVA RESPIMAT) 2.5 MCG/ACT AERS Inhale 2 puffs into the lungs daily. (Patient not taking: Reported on 10/09/2016) 3 Inhaler 3  . umeclidinium-vilanterol (ANORO ELLIPTA) 62.5-25 MCG/INH AEPB Inhale 1 puff into the lungs daily. 60 each 5   No current facility-administered medications on file prior to visit.     BP (!) 142/60 (BP Location: Left Arm, Patient Position: Sitting, Cuff Size: Normal)   Pulse 98   Temp 98 F (36.7 C) (Oral)   Ht 5\' 6"  (1.676 m)   Wt 133 lb (60.3 kg)   SpO2 98%   BMI 21.47 kg/m         Objective:   Physical Exam  Constitutional: She is oriented to person, place, and time. She appears well-developed and well-nourished. No distress.  HENT:  Head: Normocephalic and atraumatic.  Right Ear: External ear normal.  Left Ear: External ear normal.  Nose: Nose normal.  Mouth/Throat: Oropharynx is clear and moist. No oropharyngeal exudate.  Eyes: Conjunctivae and EOM are normal. Pupils are equal, round, and reactive to light. Right eye exhibits no discharge. Left eye exhibits no discharge. No scleral icterus.  Cardiovascular: Normal rate, regular rhythm and intact distal pulses.  Exam reveals no gallop and no friction rub.   Murmur (faint ) heard. Pulmonary/Chest: Effort normal and breath sounds normal. No respiratory distress. She has no wheezes. She has no rales. She exhibits no tenderness.  She become short of breath  very easily in the exam room by just getting out of her wheelchair and walking to the door. This distance is approximately 5 feet  Neurological: She is alert and oriented to person, place, and time.  Skin: Skin is warm and dry. No rash noted. She is not diaphoretic. No erythema. There is pallor.  Psychiatric: She has a normal mood and affect. Her behavior is normal. Judgment and thought content normal.  Nursing note and vitals reviewed.     Assessment & Plan:  1. SOB (shortness of breath) - Will r/o pneumonia with chest x ray. I will also see if she is anemic due to SOB and fatigue.  - DG Chest 2 View; Future - CBC with Differential/Platelet - TSH - Basic metabolic panel - Will treat as appropriate once labs and imaging has returned   2. Other fatigue - DG Chest 2 View; Future - CBC with Differential/Platelet - TSH - Basic metabolic panel  [name], NP

## 2016-10-09 NOTE — H&P (Addendum)
History and Physical    [name]:[id number] DOB: 10/18/1924  DOA: 10/09/2016 PCP: Nyoka Cowden, MD  Patient coming from: Home   Chief Complaint: Low hemoglobin on CBC done at PCP  HPI: [name] is a 83 y.o. female with medical history significant of COPD, Stroke in 2015, HTN well controlled and Hypothyroidism. Presented to her PCP c/o weakness, SOB and dizziness. On lab work up she was found to have Hgb of 4.6 for what she was sent to the hospital.   Patient report that symptoms started 2 days PTA and 3 days PTA she had 2 BM with black stool. Patient has not have any BM since then. Denies gross bleed. No abdominal pain, nausea or vomiting. Patient's last colonoscopy was on 2010 which showed 2 polyps and mild diverticulosis. Patient on ASA and Plavix due to left carotid endarterectomy about a year ago. No other complaints   ED Course: Hgb 4.6, FOBT positive, Cr 1.36 BUN 26  Review of Systems:   General: no changes in body weight, no fever or chills. Positive for decrease in energy.  HEENT: no blurry vision, hearing changes or sore throat Respiratory: no coughing, wheezing CV: no chest pain, Positive for palpitations GI: no nausea, vomiting, abdominal pain, diarrhea, constipation GU: no dysuria, burning on urination, increased urinary frequency, hematuria  Ext:. No deformities,  Neuro: no unilateral weakness, numbness, or tingling, no vision change or hearing loss Skin: No rashes, lesions or wounds. MSK: No muscle spasm, no deformity, no limitation of range of movement in spin Heme: No easy bruising.  Travel history: No recent long distant travel.   Past Medical History:  Diagnosis Date  . Anxiety   . Aortic arch atherosclerosis ([place]) 06/24/2014  . Atherosclerotic ulcer of aorta ([place]) 06/24/2014  . [place] DISEASE, LUMBAR 12/16/2008  . DIVERTICULOSIS, COLON 09/30/2008  . DYSPNEA 07/13/2008  . Graves disease   . History of embolic stroke 09/28/5571   Left  brain  . HYPERLIPIDEMIA 03/06/2007  . HYPERTENSION 03/06/2007  . HYPOTHYROIDISM 10/13/2007  . OSTEOARTHRITIS 03/06/2007  . Personal history of colonic polyps 09/30/2008  . Stroke ([place])    06/2014           . TIA (transient ischemic attack)   . TOBACCO USE, QUIT 04/12/2009  . WEAKNESS 11/09/2007    Past Surgical History:  Procedure Laterality Date  . CARDIAC CATHETERIZATION    . CATARACT EXTRACTION    . CHOLECYSTECTOMY    . ENDARTERECTOMY Left 11/13/2015   Procedure: LEFT CAROTID ARTERY ENDARTERECTOMY;  Surgeon: Conrad Ripley, MD;  Location: [facility];  Service: Vascular;  Laterality: Left;  . KNEE SURGERY    . PATCH ANGIOPLASTY Left 11/13/2015   Procedure: WITH 1CM X 6CM  XENOSURE BIOLOGIC PATCH ANGIOPLASTY;  Surgeon: Conrad Chrisney, MD;  Location: [facility];  Service: Vascular;  Laterality: Left;  . TEE WITHOUT CARDIOVERSION N/A 06/24/2014   Procedure: TRANSESOPHAGEAL ECHOCARDIOGRAM (TEE);  Surgeon: Sanda Klein, MD;  Location: [facility] ENDOSCOPY;  Service: Cardiovascular;  Laterality: N/A;     reports that she quit smoking about 38 years ago. She has never used smokeless tobacco. She reports that she drinks about 4.2 oz of alcohol per week . She reports that she does not use drugs.  Allergies  Allergen Reactions  . Catapres [Clonidine Hcl] Other (See Comments)    Made pt feel horrible, shaky, weak and nausea  . Lisinopril Anaphylaxis    Tongue swelling  . Labetalol Hcl Other (See Comments)  Caused bradycardia and syncope    Family History  Problem Relation Age of Onset  . Cancer Maternal Grandmother     stomach    Prior to Admission medications   Medication Sig Start Date End Date Taking? Authorizing Provider  ALPRAZolam Duanne Moron) 0.5 MG tablet Take 1 tablet (0.5 mg total) by mouth daily as needed for anxiety. 10/09/15  Yes Marletta Lor, MD  amLODipine (NORVASC) 10 MG tablet Take 1 tablet (10 mg total) by mouth daily. 02/14/16  Yes Marletta Lor, MD  aspirin EC 81 MG tablet Take 1  tablet (81 mg total) by mouth daily. 11/23/15  Yes Alvia Grove, PA-C  atorvastatin (LIPITOR) 80 MG tablet Take 80 mg by mouth daily.   Yes Historical Provider, MD  clopidogrel (PLAVIX) 75 MG tablet Take 1 tablet (75 mg total) by mouth daily. 02/14/16  Yes Marletta Lor, MD  Fluticasone-Umeclidin-Vilant (TRELEGY ELLIPTA) 100-62.5-25 MCG/INH AEPB Inhale 1 puff into the lungs daily. 09/19/16  Yes Praveen Mannam, MD  hydrALAZINE (APRESOLINE) 25 MG tablet Take 3 tablets (75 mg total) by mouth 3 (three) times daily. 06/18/16  Yes Eileen Stanford, PA-C  levothyroxine (SYNTHROID, LEVOTHROID) 75 MCG tablet TAKE 1 TABLET BY MOUTH EVERY DAY 10/07/16  Yes Marletta Lor, MD  umeclidinium-vilanterol [facility] ELLIPTA) 62.5-25 MCG/INH AEPB Inhale 1 puff into the lungs daily. 09/25/16  Yes Praveen Mannam, MD  budesonide-formoterol (SYMBICORT) 80-4.5 MCG/ACT inhaler Inhale 2 puffs into the lungs 2 (two) times daily. Patient not taking: Reported on 10/09/2016 05/01/16   Marshell Garfinkel, MD  budesonide-formoterol (SYMBICORT) 80-4.5 MCG/ACT inhaler Inhale 2 puffs into the lungs 2 (two) times daily. Patient not taking: Reported on 10/09/2016 05/01/16   Marshell Garfinkel, MD  fluticasone (FLONASE) 50 MCG/ACT nasal spray Place into both nostrils daily.    Historical Provider, MD  Fluticasone-Umeclidin-Vilant (TRELEGY ELLIPTA) 100-62.5-25 MCG/INH AEPB Inhale 1 puff into the lungs daily. Patient not taking: Reported on 10/09/2016 08/06/16   Marshell Garfinkel, MD  hydrALAZINE (APRESOLINE) 50 MG tablet Take 1.5 tablets (75 mg total) by mouth 3 (three) times daily. Patient not taking: Reported on 10/09/2016 11/17/15   Clayborne Dana, MD  PROAIR HFA 108 [phone] Base) MCG/ACT inhaler INHALE 2 PUFFS INTO THE LUNGS EVERY 6 (SIX) HOURS AS NEEDED FOR WHEEZING OR SHORTNESS OF BREATH. 04/02/16   Marletta Lor, MD  Tiotropium Bromide Monohydrate (SPIRIVA RESPIMAT) 2.5 MCG/ACT AERS Inhale 2 puffs into the lungs daily. Patient not taking:  Reported on 10/09/2016 05/01/16   Marshell Garfinkel, MD    Physical Exam: Vitals:   10/09/16 1820 10/09/16 1823 10/09/16 1926 10/09/16 1949  BP: (!) 141/53 (!) 141/53 (!) 139/58 (!) 150/52  Pulse: 88 86 90 87  Resp:  16 (!) 21 20  Temp:    98.1 F (36.7 C)  TempSrc: Oral   Oral  SpO2: 100% 97% 96% 97%  Weight:      Height:         Constitutional: NAD, calm, comfortable Eyes: PERRL, lids and conjunctivae pale ENMT: Mucous membranes are moist. Posterior pharynx clear of any exudate or lesions.  Neck: normal, supple, no masses, no thyromegaly Respiratory: clear to auscultation bilaterally, no wheezing, no crackles. Normal respiratory effort. Cardiovascular: Regular rate and rhythm,  2/6 SM,  2+ pedal pulses. No carotid bruits.  Abdomen: no tenderness, no masses palpated. No hepatosplenomegaly. Bowel sounds positive.  Musculoskeletal: no clubbing / cyanosis. No joint deformity upper and lower extremities.  Skin: no rashes, lesions, ulcers. No induration  Neurologic: CN 2-12 grossly intact. Psychiatric: Normal judgment and insight. Alert and oriented x 3. Normal mood.    Labs on Admission: I have personally reviewed following labs and imaging studies  CBC:  Recent Labs Lab 10/09/16 1421  WBC 4.8  NEUTROABS 3.3  HGB 4.6 Repeated and verified X2.*  HCT 14.6 Repeated and verified X2.*  MCV 69.6 Repeated and verified X2.*  PLT 124.5   Basic Metabolic Panel:  Recent Labs Lab 10/09/16 1421 10/09/16 1744  NA 136 135  K 4.1 3.8  CL 103 103  CO2 26 23  GLUCOSE 138* 123*  BUN 23 26*  CREATININE 1.33* 1.36*  CALCIUM 8.8 8.8*   GFR: Estimated Creatinine Clearance: 26.3 mL/min (A) (by C-G formula based on SCr of 1.36 mg/dL (H)). Liver Function Tests:  Recent Labs Lab 10/09/16 1744  AST 21  ALT 18  ALKPHOS 102  BILITOT 0.4  PROT 6.5  ALBUMIN 3.8   No results for input(s): LIPASE, AMYLASE in the last 168 hours. No results for input(s): AMMONIA in the last 168  hours. Coagulation Profile: No results for input(s): INR, PROTIME in the last 168 hours. Cardiac Enzymes: No results for input(s): CKTOTAL, CKMB, CKMBINDEX, TROPONINI in the last 168 hours. BNP (last 3 results)  Recent Labs  12/11/15 1216  PROBNP 46.0   HbA1C: No results for input(s): HGBA1C in the last 72 hours. CBG: No results for input(s): GLUCAP in the last 168 hours. Lipid Profile: No results for input(s): CHOL, HDL, LDLCALC, TRIG, CHOLHDL, LDLDIRECT in the last 72 hours. Thyroid Function Tests:  Recent Labs  10/09/16 1421  TSH 2.69   Anemia Panel:  Recent Labs  10/09/16 1744  RETICCTPCT 2.9   Urine analysis:    Component Value Date/Time   COLORURINE YELLOW 12/11/2015 1216   APPEARANCEUR CLEAR 12/11/2015 1216   LABSPEC 1.010 12/11/2015 1216   PHURINE 6.5 12/11/2015 1216   GLUCOSEU NEGATIVE 12/11/2015 1216   HGBUR NEGATIVE 12/11/2015 1216   BILIRUBINUR NEGATIVE 12/11/2015 1216   BILIRUBINUR n 08/17/2012 1455   KETONESUR NEGATIVE 12/11/2015 1216   PROTEINUR NEGATIVE 11/21/2015 1325   UROBILINOGEN 0.2 12/11/2015 1216   NITRITE NEGATIVE 12/11/2015 1216   LEUKOCYTESUR TRACE (A) 12/11/2015 1216    Radiological Exams on Admission: Dg Chest 2 View  Result Date: 10/09/2016 CLINICAL DATA:  Shortness of breath over the past 2-3 days especially with activity. History of COPD, former smoker. EXAM: CHEST  2 VIEW COMPARISON:  Chest x-ray of December 11, 2015 and chest CT scan of April 05, 2016. FINDINGS: The lungs are hyperinflated with hemidiaphragm flattening. There is subtle increased density peripherally in the left upper lung. There is hazy increased density in the right infrahilar region which is new. There is chronic blunting of the costophrenic angles. Subtle nodularity on the left inferior to the larger area of consolidation is stable. The heart and pulmonary vascularity are normal. There is calcification in the wall of the aortic arch. The bony thorax exhibits no  acute abnormality. IMPRESSION: COPD. Hazy increased density peripherally in the left mid lung may reflect pneumonia. Coarse right infrahilar lung markings are consistent with atelectasis or early interstitial pneumonia. Followup PA and lateral chest X-ray is recommended in 3-4 weeks following trial of antibiotic therapy to ensure resolution and exclude underlying malignancy. Stable subcentimeter nodules in the left mid lung. Thoracic aortic atherosclerosis. Electronically Signed   By: David  Martinique M.D.   On: 10/09/2016 15:18    EKG: Independently reviewed. Sinus rhythm no acute  abnormalities   Assessment/Plan GI bleed - likely to be upper in setting of ASA and Plavix. Melena 3 days ago. Denies use of NSAID's. FOBT positive. BUN slight elevated  Admit to SDU  Transfuse 3 units PRBC's Protonix 40 mg IV BID  Will get GI consult in AM  Anemia panel  Clear liquids diet  Monitor CBC q 6 hrs  Will get GI consult in AM - may need EGD   HTN - slight elevated in the ED  Resume home meds - Hydralazine and Norvasc Monitor BP   COPD stable  Patient on Stiolto at home - no on formulary will use Anoro instead    Hypothyroidism  Resume Synthroid  Acute on CKD stage III - Likely due to hypoperfusion  Getting transfusion - will hold on IVF for now  Check BMP in AM   DVT prophylaxis: SCD's Code Status: FULL  Family Communication: Daughter at bedside  Disposition Plan: Anticipate discharge to previous home environment.  Consults called: None, need to call GI in AM  Admission status: Inpatient/SDU  Chipper Oman MD [facility] Pager: Text Page via [email or web address]  [phone]  If 7PM-7AM, please contact night-coverage [email or web address] Password [facility]  10/09/2016, 7:58 PM

## 2016-10-09 NOTE — ED Notes (Signed)
Patient given chicken noodle soup and a ham sandwhich

## 2016-10-09 NOTE — ED Triage Notes (Signed)
Per EMS, pt's PCP reports pt has Hgb of 4.6. Blood work was taken at [facility] office this morning. Pt states she has shortness of breath and weakness. Pt ambulatory, alert and oriented upon EMS arrival

## 2016-10-10 DIAGNOSIS — K921 Melena: Secondary | ICD-10-CM

## 2016-10-10 LAB — BASIC METABOLIC PANEL
ANION GAP: 6 (ref 5–15)
BUN: 24 mg/dL — ABNORMAL HIGH (ref 6–20)
CALCIUM: 8.3 mg/dL — AB (ref 8.9–10.3)
CO2: 25 mmol/L (ref 22–32)
Chloride: 106 mmol/L (ref 101–111)
Creatinine, Ser: 1.37 mg/dL — ABNORMAL HIGH (ref 0.44–1.00)
GFR, EST AFRICAN AMERICAN: 38 mL/min — AB (ref >60)
GFR, EST NON AFRICAN AMERICAN: 33 mL/min — AB (ref >60)
Glucose, Bld: 95 mg/dL (ref 65–99)
Potassium: 3.5 mmol/L (ref 3.5–5.1)
Sodium: 137 mmol/L (ref 135–145)

## 2016-10-10 LAB — CBC WITH DIFFERENTIAL/PLATELET
BASOS ABS: 0 10*3/uL (ref 0.0–0.1)
BASOS PCT: 1 %
Basophils Absolute: 0 10*3/uL (ref 0.0–0.1)
Basophils Relative: 1 %
EOS ABS: 0.2 10*3/uL (ref 0.0–0.7)
EOS PCT: 5 %
Eosinophils Absolute: 0.2 10*3/uL (ref 0.0–0.7)
Eosinophils Relative: 5 %
HCT: 26.1 % — ABNORMAL LOW (ref 36.0–46.0)
HEMATOCRIT: 25 % — AB (ref 36.0–46.0)
HEMOGLOBIN: 8.7 g/dL — AB (ref 12.0–15.0)
Hemoglobin: 8.1 g/dL — ABNORMAL LOW (ref 12.0–15.0)
Lymphocytes Relative: 16 %
Lymphocytes Relative: 17 %
Lymphs Abs: 0.8 10*3/uL (ref 0.7–4.0)
Lymphs Abs: 0.8 10*3/uL (ref 0.7–4.0)
MCH: 24.6 pg — ABNORMAL LOW (ref 26.0–34.0)
MCH: 26 pg (ref 26.0–34.0)
MCHC: 32.4 g/dL (ref 30.0–36.0)
MCHC: 33.3 g/dL (ref 30.0–36.0)
MCV: 76 fL — ABNORMAL LOW (ref 78.0–100.0)
MCV: 77.9 fL — ABNORMAL LOW (ref 78.0–100.0)
MONO ABS: 0.7 10*3/uL (ref 0.1–1.0)
Monocytes Absolute: 0.4 10*3/uL (ref 0.1–1.0)
Monocytes Relative: 14 %
Monocytes Relative: 9 %
NEUTROS ABS: 3.3 10*3/uL (ref 1.7–7.7)
NEUTROS PCT: 68 %
Neutro Abs: 3.4 10*3/uL (ref 1.7–7.7)
Neutrophils Relative %: 65 %
PLATELETS: 215 10*3/uL (ref 150–400)
Platelets: 225 10*3/uL (ref 150–400)
RBC: 3.29 MIL/uL — ABNORMAL LOW (ref 3.87–5.11)
RBC: 3.35 MIL/uL — AB (ref 3.87–5.11)
RDW: 17.3 % — ABNORMAL HIGH (ref 11.5–15.5)
RDW: 17.5 % — ABNORMAL HIGH (ref 11.5–15.5)
WBC: 5.1 10*3/uL (ref 4.0–10.5)
WBC: 4.9 10*3/uL (ref 4.0–10.5)

## 2016-10-10 LAB — MRSA PCR SCREENING: MRSA BY PCR: NEGATIVE

## 2016-10-10 MED ORDER — SODIUM CHLORIDE 0.9 % IV SOLN
INTRAVENOUS | Status: DC
  Administered 2016-10-10 – 2016-10-11 (×2): via INTRAVENOUS

## 2016-10-10 NOTE — Care Management Note (Signed)
Case Management Note  Patient Details  Name: [name] MRN: [id number] Date of Birth: [birth year]  Subjective/Objective:     liives alone, admitted due to hgb 4.6 and bld transfusion given for system support.               Action/Plan:Date:  October 10, 2016 Chart reviewed for concurrent status and case management needs. Will continue to follow patient progress. Discharge Planning: following for needs Expected discharge date: [id number] Velva Harman, BSN, [place], [place]   Expected Discharge Date:   (unknown)               Expected Discharge Plan:  Home/Self Care  In-House Referral:     Discharge planning Services     Post Acute Care Choice:    Choice offered to:     DME Arranged:    DME Agency:     HH Arranged:    [place] Agency:     Status of Service:  In process, will continue to follow  If discussed at Long Length of Stay Meetings, dates discussed:    Additional Comments:  Leeroy Cha, RN 10/10/2016, 10:46 AM

## 2016-10-10 NOTE — Progress Notes (Addendum)
PROGRESS NOTE [facility]   [name]   BSW:[id number] DOB: [birth year]  DOA: 10/09/2016 PCP: Nyoka Cowden, MD   Brief Narrative:  [name] is a 83 y.o. female with medical history significant of COPD, Stroke in 2015, HTN well controlled and Hypothyroidism. Presented to her PCP c/o weakness, SOB and dizziness. On lab work up she was found to have Hgb of 4.6 for what she was sent to the hospital.   Patient report that symptoms started 2 days PTA and 3 days PTA she had 2 BM with black stool. Patient has not have any BM since then. Denies gross bleed. No abdominal pain, nausea or vomiting. Patient's last colonoscopy was on 2010 which showed 2 polyps and mild diverticulosis. Patient on ASA and Plavix due to left carotid endarterectomy about a year ago. Patient admitted for suspected upper GID bleed, and was transfuse 3 units of PRBC's. GI consulted planning for EGD in AM   Subjective: Patient seen and examined doing well, have no complains this AM. Hgb responded well to transfusion. Feeling more energetic. Denies palpitations and SOB.   Assessment & Plan: GI bleed - likely to be upper in setting of ASA and Plavix. Melena 3 days ago. Denies use of NSAID's. FOBT positive. BUN slight elevated  Can transfer to medsurg  s/p 3 units PRBC's - Hgb stable  Continue Protonix 40 mg IV BID  GI appreciated for EGD in AM  Anemia panel - shows Iron deficiency - will give IV venofer prior to d/c - then Iron supplement TID  Full liquids diet then NPO after midnight  Check CBC in AM   HTN - BP stable Continue home meds - Hydralazine and Norvasc Monitor BP   COPD stable  No active wheezing  Continue Anoro   Hypothyroidism  Continue Synthroid  Acute on CKD stage III - Likely due to hypoperfusion  Baseline Cr 1.09 Cr not much improved after transfusion - will give gentle hydration  Check BMP in AM   DVT prophylaxis: SCD's  Code Status: FULL  Family Communication:  Daughter at bedside  Disposition Plan: Likely home after EGD if no complications   Consultants:   GI - [place]   Procedures:     Antimicrobials:      Objective: Vitals:   10/10/16 0833 10/10/16 0900 10/10/16 1000 10/10/16 1200  BP:  (!) 151/48 (!) 146/40   Pulse:  75 69   Resp:  19 18   Temp:    98.1 F (36.7 C)  TempSrc:    Oral  SpO2: 97% 96% 97%   Weight:      Height:        Intake/Output Summary (Last 24 hours) at 10/10/16 1404 Last data filed at 10/10/16 0400  Gross per 24 hour  Intake          1344.67 ml  Output             2000 ml  Net          -655.33 ml   Filed Weights   10/09/16 1700 10/09/16 2216  Weight: 60.3 kg (133 lb) 61.2 kg (134 lb 14.7 oz)    Examination:  General exam: Appears calm and comfortable  HEENT: OP moist and clear, Conjunctiva normal color  Respiratory system: Clear to auscultation. No wheezes,crackle or rhonchi Cardiovascular system: S1 & S2 heard, RRR. No JVD, murmurs, rubs or gallops Gastrointestinal system: Abdomen is nondistended, soft and nontender. Normal bowel sounds heard. Central nervous system: Alert  and oriented.  Extremities: No pedal edema.  Skin: No rashes, lesions or ulcers Psychiatry: Judgement and insight appear normal. Mood & affect appropriate.    Data Reviewed: I have personally reviewed following labs and imaging studies  CBC:  Recent Labs Lab 10/09/16 1421 10/09/16 1744 10/10/16 0629  WBC 4.8 5.0 5.1  NEUTROABS 3.3 3.4 3.3  HGB 4.6 Repeated and verified X2.* 4.1* 8.1*  HCT 14.6 Repeated and verified X2.* 13.6* 25.0*  MCV 69.6 Repeated and verified X2.* 72.0* 76.0*  PLT 300.0 276 937   Basic Metabolic Panel:  Recent Labs Lab 10/09/16 1421 10/09/16 1744 10/10/16 0629  NA 136 135 137  K 4.1 3.8 3.5  CL 103 103 106  CO2 26 23 25   GLUCOSE 138* 123* 95  BUN 23 26* 24*  CREATININE 1.33* 1.36* 1.37*  CALCIUM 8.8 8.8* 8.3*   GFR: Estimated Creatinine Clearance: 26.1 mL/min (A) (by C-G  formula based on SCr of 1.37 mg/dL (H)). Liver Function Tests:  Recent Labs Lab 10/09/16 1744  AST 21  ALT 18  ALKPHOS 102  BILITOT 0.4  PROT 6.5  ALBUMIN 3.8   No results for input(s): LIPASE, AMYLASE in the last 168 hours. No results for input(s): AMMONIA in the last 168 hours. Coagulation Profile: No results for input(s): INR, PROTIME in the last 168 hours. Cardiac Enzymes: No results for input(s): CKTOTAL, CKMB, CKMBINDEX, TROPONINI in the last 168 hours. BNP (last 3 results)  Recent Labs  12/11/15 1216  PROBNP 46.0   HbA1C: No results for input(s): HGBA1C in the last 72 hours. CBG: No results for input(s): GLUCAP in the last 168 hours. Lipid Profile: No results for input(s): CHOL, HDL, LDLCALC, TRIG, CHOLHDL, LDLDIRECT in the last 72 hours. Thyroid Function Tests:  Recent Labs  10/09/16 1421  TSH 2.69   Anemia Panel:  Recent Labs  10/09/16 1740 10/09/16 1744  VITAMINB12 326  --   FOLATE 11.0  --   FERRITIN 5*  --   TIBC 382  --   IRON 12*  --   RETICCTPCT  --  2.9   Sepsis Labs: No results for input(s): PROCALCITON, LATICACIDVEN in the last 168 hours.  Recent Results (from the past 240 hour(s))  MRSA PCR Screening     Status: None   Collection Time: 10/09/16 10:47 PM  Result Value Ref Range Status   MRSA by PCR NEGATIVE NEGATIVE Final    Comment:        The GeneXpert MRSA Assay (FDA approved for NASAL specimens only), is one component of a comprehensive MRSA colonization surveillance program. It is not intended to diagnose MRSA infection nor to guide or monitor treatment for MRSA infections.      Radiology Studies: Dg Chest 2 View  Result Date: 10/09/2016 CLINICAL DATA:  Shortness of breath over the past 2-3 days especially with activity. History of COPD, former smoker. EXAM: CHEST  2 VIEW COMPARISON:  Chest x-ray of December 11, 2015 and chest CT scan of April 05, 2016. FINDINGS: The lungs are hyperinflated with hemidiaphragm flattening.  There is subtle increased density peripherally in the left upper lung. There is hazy increased density in the right infrahilar region which is new. There is chronic blunting of the costophrenic angles. Subtle nodularity on the left inferior to the larger area of consolidation is stable. The heart and pulmonary vascularity are normal. There is calcification in the wall of the aortic arch. The bony thorax exhibits no acute abnormality. IMPRESSION: COPD. Hazy increased density  peripherally in the left mid lung may reflect pneumonia. Coarse right infrahilar lung markings are consistent with atelectasis or early interstitial pneumonia. Followup PA and lateral chest X-ray is recommended in 3-4 weeks following trial of antibiotic therapy to ensure resolution and exclude underlying malignancy. Stable subcentimeter nodules in the left mid lung. Thoracic aortic atherosclerosis. Electronically Signed   By: David  Martinique M.D.   On: 10/09/2016 15:18      Scheduled Meds: . amLODipine  10 mg Oral Daily  . feeding supplement  1 Container Oral TID BM  . hydrALAZINE  75 mg Oral TID  . levothyroxine  75 mcg Oral Daily  . pantoprazole (PROTONIX) IV  40 mg Intravenous Q12H  . umeclidinium-vilanterol  1 puff Inhalation Daily   Continuous Infusions:   LOS: 1 day    Chipper Oman, MD Pager: Text Page via [email or web address]  [phone]  If 7PM-7AM, please contact night-coverage [email or web address] Password [email or web address] 10/10/2016, 2:04 PM

## 2016-10-10 NOTE — Consult Note (Signed)
Referring Provider: Dr. Quincy Simmonds Primary Care Physician:  Nyoka Cowden, MD Primary Gastroenterologist:  Dr. Olevia Perches  Reason for Consultation:  Anemia and heme positive stools  HPI: [name] is a 83 y.o. female with medical history significant of COPD, Stroke in 2015, HTN, and Hypothyroidism. Presented to her PCP c/o weakness, SOB, and dizziness on 4/4. On lab work up she was found to have Hgb of 4.6 grams for what she was sent to the hospital for evaluation.  Was 4.1 grams here in the ED.  Patient reports that she had some very dark, almost black stools that started about one week ago.  the last black stool was Sunday.  She started feeling poorly on Sunday, weak, SOB.  Could barely walk due to her legs being so weak.  EMS came on Sunday or Monday and BP and blood sugars were ok so she did not come to the hospital.  She continued to feel poorly so was seen by her PCP yesterday as stated above.  Patient denies seeing any red or maroon blood in her stools.  No abdominal pain, nausea or vomiting, heartburn, reflux.  Says that prior to all of this she was feeling great.  She is very active, drives her car, grocery shops on her own, plays bridge at community centers.  Patient is on ASA 81 mg daily and Plavix due to left carotid endarterectomy about a year ago. No NSAID's.  She is not on a PPI at home but is receiving pantoprazole 40 mg IV BID here.  She has received 3 units of PRBC's and Hgb has increased to 8.1 grams.  She feels much better.  Colonoscopy 11/2008 byDr. Olevia Perches showed 2 polyps that were removed from the sigmoid colon (hyperplastic), mild diverticulosis.  Never had an EGD in the past.   Past Medical History:  Diagnosis Date  . Anxiety   . Aortic arch atherosclerosis ([place]) 06/24/2014  . Atherosclerotic ulcer of aorta ([place]) 06/24/2014  . [place] DISEASE, LUMBAR 12/16/2008  . DIVERTICULOSIS, COLON 09/30/2008  . DYSPNEA 07/13/2008  . Graves disease   . History of embolic stroke  2/67/1245   Left brain  . HYPERLIPIDEMIA 03/06/2007  . HYPERTENSION 03/06/2007  . HYPOTHYROIDISM 10/13/2007  . OSTEOARTHRITIS 03/06/2007  . Personal history of colonic polyps 09/30/2008  . Stroke ([place])    06/2014           . TIA (transient ischemic attack)   . TOBACCO USE, QUIT 04/12/2009  . WEAKNESS 11/09/2007    Past Surgical History:  Procedure Laterality Date  . CARDIAC CATHETERIZATION    . CATARACT EXTRACTION    . CHOLECYSTECTOMY    . ENDARTERECTOMY Left 11/13/2015   Procedure: LEFT CAROTID ARTERY ENDARTERECTOMY;  Surgeon: Conrad [place], MD;  Location: [place];  Service: Vascular;  Laterality: Left;  . KNEE SURGERY    . PATCH ANGIOPLASTY Left 11/13/2015   Procedure: WITH 1CM X 6CM  XENOSURE BIOLOGIC PATCH ANGIOPLASTY;  Surgeon: Conrad Fruitdale, MD;  Location: [place];  Service: Vascular;  Laterality: Left;  . TEE WITHOUT CARDIOVERSION N/A 06/24/2014   Procedure: TRANSESOPHAGEAL ECHOCARDIOGRAM (TEE);  Surgeon: Sanda Klein, MD;  Location: [facility] ENDOSCOPY;  Service: Cardiovascular;  Laterality: N/A;    Prior to Admission medications   Medication Sig Start Date End Date Taking? Authorizing Provider  ALPRAZolam Duanne Moron) 0.5 MG tablet Take 1 tablet (0.5 mg total) by mouth daily as needed for anxiety. 10/09/15  Yes Marletta Lor, MD  amLODipine (NORVASC) 10 MG tablet  Take 1 tablet (10 mg total) by mouth daily. 02/14/16  Yes Marletta Lor, MD  aspirin EC 81 MG tablet Take 1 tablet (81 mg total) by mouth daily. 11/23/15  Yes Alvia Grove, PA-C  atorvastatin (LIPITOR) 80 MG tablet Take 80 mg by mouth daily.   Yes Historical Provider, MD  clopidogrel (PLAVIX) 75 MG tablet Take 1 tablet (75 mg total) by mouth daily. 02/14/16  Yes Marletta Lor, MD  Fluticasone-Umeclidin-Vilant (TRELEGY ELLIPTA) 100-62.5-25 MCG/INH AEPB Inhale 1 puff into the lungs daily. 09/19/16  Yes Praveen Mannam, MD  hydrALAZINE (APRESOLINE) 25 MG tablet Take 3 tablets (75 mg total) by mouth 3 (three) times daily. 06/18/16   Yes Eileen Stanford, PA-C  levothyroxine (SYNTHROID, LEVOTHROID) 75 MCG tablet TAKE 1 TABLET BY MOUTH EVERY DAY 10/07/16  Yes Marletta Lor, MD  umeclidinium-vilanterol [facility] ELLIPTA) 62.5-25 MCG/INH AEPB Inhale 1 puff into the lungs daily. 09/25/16  Yes Praveen Mannam, MD  budesonide-formoterol (SYMBICORT) 80-4.5 MCG/ACT inhaler Inhale 2 puffs into the lungs 2 (two) times daily. Patient not taking: Reported on 10/09/2016 05/01/16   Marshell Garfinkel, MD  budesonide-formoterol (SYMBICORT) 80-4.5 MCG/ACT inhaler Inhale 2 puffs into the lungs 2 (two) times daily. Patient not taking: Reported on 10/09/2016 05/01/16   Marshell Garfinkel, MD  fluticasone (FLONASE) 50 MCG/ACT nasal spray Place into both nostrils daily.    Historical Provider, MD  Fluticasone-Umeclidin-Vilant (TRELEGY ELLIPTA) 100-62.5-25 MCG/INH AEPB Inhale 1 puff into the lungs daily. Patient not taking: Reported on 10/09/2016 08/06/16   Marshell Garfinkel, MD  hydrALAZINE (APRESOLINE) 50 MG tablet Take 1.5 tablets (75 mg total) by mouth 3 (three) times daily. Patient not taking: Reported on 10/09/2016 11/17/15   Clayborne Dana, MD  PROAIR HFA 108 [phone] Base) MCG/ACT inhaler INHALE 2 PUFFS INTO THE LUNGS EVERY 6 (SIX) HOURS AS NEEDED FOR WHEEZING OR SHORTNESS OF BREATH. 04/02/16   Marletta Lor, MD  Tiotropium Bromide Monohydrate (SPIRIVA RESPIMAT) 2.5 MCG/ACT AERS Inhale 2 puffs into the lungs daily. Patient not taking: Reported on 10/09/2016 05/01/16   Marshell Garfinkel, MD    Current Facility-Administered Medications  Medication Dose Route Frequency Provider Last Rate Last Dose  . acetaminophen (TYLENOL) tablet 650 mg  650 mg Oral Q6H PRN Doreatha Lew, MD       Or  . acetaminophen (TYLENOL) suppository 650 mg  650 mg Rectal Q6H PRN Doreatha Lew, MD      . ALPRAZolam Duanne Moron) tablet 0.5 mg  0.5 mg Oral Daily PRN Doreatha Lew, MD      . amLODipine (NORVASC) tablet 10 mg  10 mg Oral Daily Doreatha Lew, MD      . feeding  supplement (BOOST / RESOURCE BREEZE) liquid 1 Container  1 Container Oral TID BM Doreatha Lew, MD      . guaiFENesin-dextromethorphan (ROBITUSSIN DM) 100-10 MG/5ML syrup 5 mL  5 mL Oral Q4H PRN Doreatha Lew, MD      . hydrALAZINE (APRESOLINE) tablet 75 mg  75 mg Oral TID Doreatha Lew, MD   75 mg at 10/09/16 2259  . levothyroxine (SYNTHROID, LEVOTHROID) tablet 75 mcg  75 mcg Oral Daily Doreatha Lew, MD   75 mcg at 10/10/16 [phone]  . ondansetron (ZOFRAN) tablet 4 mg  4 mg Oral Q6H PRN Doreatha Lew, MD       Or  . ondansetron [facility]) injection 4 mg  4 mg Intravenous Q6H PRN Doreatha Lew, MD      .  pantoprazole (PROTONIX) injection 40 mg  40 mg Intravenous Q12H Doreatha Lew, MD      . umeclidinium-vilanterol [facility] ELLIPTA) 62.5-25 MCG/INH 1 puff  1 puff Inhalation Daily Doreatha Lew, MD   1 puff at 10/10/16 0831    Allergies as of 10/09/2016 - Review Complete 10/09/2016  Allergen Reaction Noted  . Catapres [clonidine hcl] Other (See Comments) 10/27/2015  . Lisinopril Anaphylaxis 06/22/2014  . Labetalol hcl Other (See Comments) 10/04/2015    Family History  Problem Relation Age of Onset  . Cancer Maternal Grandmother     stomach    Social History   Social History  . Marital status: Divorced    Spouse name: N/A  . Number of children: 4  . Years of education: college   Occupational History  . N/A    Social History Main Topics  . Smoking status: Former Smoker    Quit date: 07/08/1978  . Smokeless tobacco: Never Used  . Alcohol use 4.2 oz/week    7 Glasses of wine per week     Comment: "red wine"  . Drug use: No  . Sexual activity: Not on file   Other Topics Concern  . Not on file   Social History Narrative      Patient is right handed.   Patient drinks 1 cup caffeine daily.    Review of Systems: ROS is negative except as mentioned in HPI.  Physical Exam: Vital signs in last 24 hours: Temp:  [97.7 F (36.5 C)-98.9 F  (37.2 C)] 98.1 F (36.7 C) (04/05 0325) Pulse Rate:  [69-98] 69 (04/05 0600) Resp:  [15-23] 18 (04/05 0600) BP: (127-177)/(34-80) 160/44 (04/05 0600) SpO2:  [93 %-100 %] 97 % (04/05 0833) Weight:  [133 lb (60.3 kg)-134 lb 14.7 oz (61.2 kg)] 134 lb 14.7 oz (61.2 kg) (04/04 2216) Last BM Date: 10/05/16 (per patient) General:  Alert, Well-developed, well-nourished, pleasant and cooperative in NAD Head:  Normocephalic and atraumatic. Eyes:  Sclera clear, no icterus.  Conjunctiva pink. Ears:  Normal auditory acuity. Mouth:  No deformity or lesions.   Lungs:  Clear throughout to auscultation.  No wheezes, crackles, or rhonchi.  No increased WOB. Heart:  Regular rate and rhythm; no murmurs, clicks, rubs, or gallops. Abdomen:  Soft, non-distended.  BS present.  Non-tender Rectal:  Deferred.  Was hemoccult positive in the ED.  Msk:  Symmetrical without gross deformities. Pulses:  Normal pulses noted. Extremities:  Without clubbing or edema. Neurologic:  Alert and  oriented x4;  grossly normal neurologically. Skin:  Intact without significant lesions or rashes. Psych:  Alert and cooperative. Normal mood and affect.  Intake/Output from previous day: 04/04 0701 - 04/05 0700 In: 1344.7 [I.V.:45; Blood:1299.7] Out: 2000 [Urine:2000]  Lab Results:  Recent Labs  10/09/16 1421 10/09/16 1744 10/10/16 0629  WBC 4.8 5.0 5.1  HGB 4.6 Repeated and verified X2.* 4.1* 8.1*  HCT 14.6 Repeated and verified X2.* 13.6* 25.0*  PLT 300.0 276 215   BMET  Recent Labs  10/09/16 1421 10/09/16 1744 10/10/16 0629  NA 136 135 137  K 4.1 3.8 3.5  CL 103 103 106  CO2 26 23 25   GLUCOSE 138* 123* 95  BUN 23 26* 24*  CREATININE 1.33* 1.36* 1.37*  CALCIUM 8.8 8.8* 8.3*   LFT  Recent Labs  10/09/16 1744  PROT 6.5  ALBUMIN 3.8  AST 21  ALT 18  ALKPHOS 102  BILITOT 0.4   Studies/Results: Dg Chest 2 View  Result Date:  10/09/2016 CLINICAL DATA:  Shortness of breath over the past 2-3 days  especially with activity. History of COPD, former smoker. EXAM: CHEST  2 VIEW COMPARISON:  Chest x-ray of December 11, 2015 and chest CT scan of April 05, 2016. FINDINGS: The lungs are hyperinflated with hemidiaphragm flattening. There is subtle increased density peripherally in the left upper lung. There is hazy increased density in the right infrahilar region which is new. There is chronic blunting of the costophrenic angles. Subtle nodularity on the left inferior to the larger area of consolidation is stable. The heart and pulmonary vascularity are normal. There is calcification in the wall of the aortic arch. The bony thorax exhibits no acute abnormality. IMPRESSION: COPD. Hazy increased density peripherally in the left mid lung may reflect pneumonia. Coarse right infrahilar lung markings are consistent with atelectasis or early interstitial pneumonia. Followup PA and lateral chest X-ray is recommended in 3-4 weeks following trial of antibiotic therapy to ensure resolution and exclude underlying malignancy. Stable subcentimeter nodules in the left mid lung. Thoracic aortic atherosclerosis. Electronically Signed   By: David  Martinique M.D.   On: 10/09/2016 15:18   IMPRESSION:  *83 year old female with profound symptomatic anemia with Hgb 4.1 grams.  Hemoccult positive and reports dark stools recently in the setting of anti-platelet use.  Hgb was 11.9 grams 10 months ago.  Suspect sub-acute UGIB.  Rule out ulcer, malignancy, etc.  Hgb responded well to 3 units PRBC's and is up to 8.1 grams this AM. -History of CVA requiring chronic antiplatelet use with Plavix  PLAN: -Will plan for EGd 4/6 at 8 AM. -Full liquid diet today then NPO after midnight. -Continue pantoprazole 40 mg IV BID for now. -Monitor Hgb and transfuse further prn.   ZEHR, JESSICA D.  10/10/2016, 9:08 AM  Pager number [phone]   Attending physician's note   I have taken a history, examined the patient and reviewed the chart. I agree  with the Advanced Practitioner's note, impression and recommendations. 83 year old female admitted with severe symptomatic anemia with history of melena for past few days concerning for upper GI bleed. We'll proceed with EGD for evaluation. Hemoglobin responded appropriately to blood transfusion. Currently hemodynamically stable. Plavix last dose was yesterday morning.  PPI twice daily The risks and benefits as well as alternatives of endoscopic procedure(s) have been discussed and reviewed. All questions answered. The patient agrees to proceed.  Damaris Hippo, MD [phone] Mon-Fri 8a-5p [phone] after 5p, weekends, holidays

## 2016-10-10 NOTE — Progress Notes (Signed)
Nutrition Brief Note  Patient identified on the Malnutrition Screening Tool (MST) Report  Wt Readings from Last 15 Encounters:  10/09/16 134 lb 14.7 oz (61.2 kg)  10/09/16 133 lb (60.3 kg)  05/01/16 136 lb 12.8 oz (62.1 kg)  03/27/16 139 lb 9.6 oz (63.3 kg)  02/14/16 137 lb (62.1 kg)  02/01/16 135 lb 8 oz (61.5 kg)  01/31/16 137 lb (62.1 kg)  01/12/16 140 lb (63.5 kg)  01/05/16 138 lb (62.6 kg)  12/25/15 142 lb (64.4 kg)  12/11/15 142 lb (64.4 kg)  12/08/15 141 lb 14.4 oz (64.4 kg)  12/01/15 142 lb (64.4 kg)  11/21/15 147 lb 14.9 oz (67.1 kg)  11/13/15 150 lb (68 kg)    Body mass index is 21.78 kg/m. Patient meets criteria for normal weight based on current BMI. Skin WDL.   Pt admitted with suspected UGIB. Current diet order is CLD and pt consumed 100% of jello, chicken broth, and cranberry juice for breakfast this AM. Plan for NPO after midnight for EGD tomorrow.Pt denies abdominal pain or nausea after breakfast or any time PTA. She does not have any chewing or swallowing issues at baseline. PTA pt would eat 3 meals/day (breakfast: 2 eggs, 2 pieces of [place] bacon; lunch: 1/2 sandwich; dinner: a large, variable meal that she cooked or went out to eat). Labs and medications reviewed.   Boost Breeze ordered TID (250 kcal and 9 grams of protein per carton) which is appropriate while pt is on CLD. No additional nutrition interventions warranted at this time. If nutrition issues arise, please consult RD.     Jarome Matin, MS, RD, LDN, [facility] Clinical Dietitian Pager # [phone] After hours/weekend pager # [phone]

## 2016-10-11 ENCOUNTER — Other Ambulatory Visit: Payer: Self-pay | Admitting: *Deleted

## 2016-10-11 ENCOUNTER — Encounter (HOSPITAL_COMMUNITY)
Admission: EM | Disposition: A | Discharge status: Home / Self Care | Payer: Self-pay | Source: Home / Self Care | Attending: Family Medicine

## 2016-10-11 ENCOUNTER — Encounter (HOSPITAL_COMMUNITY): Payer: Self-pay | Admitting: *Deleted

## 2016-10-11 ENCOUNTER — Inpatient Hospital Stay (HOSPITAL_COMMUNITY): Payer: Medicare Other | Admitting: Certified Registered Nurse Anesthetist

## 2016-10-11 DIAGNOSIS — I6529 Occlusion and stenosis of unspecified carotid artery: Secondary | ICD-10-CM

## 2016-10-11 DIAGNOSIS — K31811 Angiodysplasia of stomach and duodenum with bleeding: Principal | ICD-10-CM

## 2016-10-11 DIAGNOSIS — D509 Iron deficiency anemia, unspecified: Secondary | ICD-10-CM

## 2016-10-11 DIAGNOSIS — D649 Anemia, unspecified: Secondary | ICD-10-CM

## 2016-10-11 DIAGNOSIS — D638 Anemia in other chronic diseases classified elsewhere: Secondary | ICD-10-CM

## 2016-10-11 HISTORY — PX: ESOPHAGOGASTRODUODENOSCOPY (EGD) WITH PROPOFOL: SHX5813

## 2016-10-11 LAB — BASIC METABOLIC PANEL
Anion gap: 6 (ref 5–15)
BUN: 22 mg/dL — AB (ref 6–20)
CO2: 23 mmol/L (ref 22–32)
CREATININE: 1.21 mg/dL — AB (ref 0.44–1.00)
Calcium: 8.2 mg/dL — ABNORMAL LOW (ref 8.9–10.3)
Chloride: 110 mmol/L (ref 101–111)
GFR calc Af Amer: 45 mL/min — ABNORMAL LOW (ref >60)
GFR, EST NON AFRICAN AMERICAN: 38 mL/min — AB (ref >60)
GLUCOSE: 115 mg/dL — AB (ref 65–99)
Potassium: 3.5 mmol/L (ref 3.5–5.1)
Sodium: 139 mmol/L (ref 135–145)

## 2016-10-11 LAB — CBC WITH DIFFERENTIAL/PLATELET
Basophils Absolute: 0 10*3/uL (ref 0.0–0.1)
Basophils Relative: 1 %
EOS ABS: 0.3 10*3/uL (ref 0.0–0.7)
EOS PCT: 7 %
HCT: 23.6 % — ABNORMAL LOW (ref 36.0–46.0)
Hemoglobin: 7.7 g/dL — ABNORMAL LOW (ref 12.0–15.0)
LYMPHS PCT: 19 %
Lymphs Abs: 0.8 10*3/uL (ref 0.7–4.0)
MCH: 25 pg — AB (ref 26.0–34.0)
MCHC: 32.6 g/dL (ref 30.0–36.0)
MCV: 76.6 fL — ABNORMAL LOW (ref 78.0–100.0)
MONO ABS: 0.5 10*3/uL (ref 0.1–1.0)
MONOS PCT: 13 %
Neutro Abs: 2.5 10*3/uL (ref 1.7–7.7)
Neutrophils Relative %: 60 %
PLATELETS: 208 10*3/uL (ref 150–400)
RBC: 3.08 MIL/uL — ABNORMAL LOW (ref 3.87–5.11)
RDW: 17.8 % — AB (ref 11.5–15.5)
WBC: 4.2 10*3/uL (ref 4.0–10.5)

## 2016-10-11 SURGERY — ESOPHAGOGASTRODUODENOSCOPY (EGD) WITH PROPOFOL
Anesthesia: Monitor Anesthesia Care

## 2016-10-11 MED ORDER — SODIUM CHLORIDE 0.9 % IV SOLN
125.0000 mg | Freq: Once | INTRAVENOUS | Status: AC
  Administered 2016-10-11: 125 mg via INTRAVENOUS
  Filled 2016-10-11: qty 10

## 2016-10-11 MED ORDER — ONDANSETRON HCL 4 MG/2ML IJ SOLN
INTRAMUSCULAR | Status: DC | PRN
  Administered 2016-10-11: 4 mg via INTRAVENOUS

## 2016-10-11 MED ORDER — PROPOFOL 10 MG/ML IV BOLUS
INTRAVENOUS | Status: AC
  Filled 2016-10-11: qty 60

## 2016-10-11 MED ORDER — PROPOFOL 10 MG/ML IV BOLUS
INTRAVENOUS | Status: DC | PRN
  Administered 2016-10-11 (×6): 20 mg via INTRAVENOUS

## 2016-10-11 MED ORDER — ONDANSETRON HCL 4 MG/2ML IJ SOLN
INTRAMUSCULAR | Status: AC
  Filled 2016-10-11: qty 2

## 2016-10-11 MED ORDER — LIDOCAINE 2% (20 MG/ML) 5 ML SYRINGE
INTRAMUSCULAR | Status: DC | PRN
  Administered 2016-10-11: 60 mg via INTRAVENOUS

## 2016-10-11 MED ORDER — LIDOCAINE 2% (20 MG/ML) 5 ML SYRINGE
INTRAMUSCULAR | Status: AC
  Filled 2016-10-11: qty 5

## 2016-10-11 MED ORDER — PROPOFOL 500 MG/50ML IV EMUL
INTRAVENOUS | Status: DC | PRN
  Administered 2016-10-11: 75 ug/kg/min via INTRAVENOUS

## 2016-10-11 SURGICAL SUPPLY — 15 items

## 2016-10-11 NOTE — Anesthesia Postprocedure Evaluation (Signed)
Anesthesia Post Note  Patient: [name]  Procedure(s) Performed: Procedure(s) (LRB): ESOPHAGOGASTRODUODENOSCOPY (EGD) WITH PROPOFOL (N/A)  Patient location during evaluation: PACU Anesthesia Type: MAC Level of consciousness: awake and alert Pain management: pain level controlled Vital Signs Assessment: post-procedure vital signs reviewed and stable Respiratory status: spontaneous breathing, nonlabored ventilation, respiratory function stable and patient connected to nasal cannula oxygen Cardiovascular status: stable and blood pressure returned to baseline Anesthetic complications: no       Last Vitals:  Vitals:   10/11/16 0920 10/11/16 0945  BP: (!) 145/39 (!) 131/56  Pulse:  76  Resp: 19 18  Temp:  36.4 C    Last Pain:  Vitals:   10/11/16 0945  TempSrc: Oral  PainSc:                  Riccardo Dubin

## 2016-10-11 NOTE — H&P (View-Only) (Signed)
Referring Provider: Dr. Quincy Simmonds Primary Care Physician:  Nyoka Cowden, MD Primary Gastroenterologist:  Dr. Olevia Perches  Reason for Consultation:  Anemia and heme positive stools  HPI: [name] is a 83 y.o. female with medical history significant of COPD, Stroke in 2015, HTN, and Hypothyroidism. Presented to her PCP c/o weakness, SOB, and dizziness on 4/4. On lab work up she was found to have Hgb of 4.6 grams for what she was sent to the hospital for evaluation.  Was 4.1 grams here in the ED.  Patient reports that she had some very dark, almost black stools that started about one week ago.  the last black stool was Sunday.  She started feeling poorly on Sunday, weak, SOB.  Could barely walk due to her legs being so weak.  EMS came on Sunday or Monday and BP and blood sugars were ok so she did not come to the hospital.  She continued to feel poorly so was seen by her PCP yesterday as stated above.  Patient denies seeing any red or maroon blood in her stools.  No abdominal pain, nausea or vomiting, heartburn, reflux.  Says that prior to all of this she was feeling great.  She is very active, drives her car, grocery shops on her own, plays bridge at community centers.  Patient is on ASA 81 mg daily and Plavix due to left carotid endarterectomy about a year ago. No NSAID's.  She is not on a PPI at home but is receiving pantoprazole 40 mg IV BID here.  She has received 3 units of PRBC's and Hgb has increased to 8.1 grams.  She feels much better.  Colonoscopy 11/2008 byDr. Olevia Perches showed 2 polyps that were removed from the sigmoid colon (hyperplastic), mild diverticulosis.  Never had an EGD in the past.   Past Medical History:  Diagnosis Date  . Anxiety   . Aortic arch atherosclerosis ([place]) 06/24/2014  . Atherosclerotic ulcer of aorta ([place]) 06/24/2014  . [place] DISEASE, LUMBAR 12/16/2008  . DIVERTICULOSIS, COLON 09/30/2008  . DYSPNEA 07/13/2008  . Graves disease   . History of embolic stroke  10/14/8117   Left brain  . HYPERLIPIDEMIA 03/06/2007  . HYPERTENSION 03/06/2007  . HYPOTHYROIDISM 10/13/2007  . OSTEOARTHRITIS 03/06/2007  . Personal history of colonic polyps 09/30/2008  . Stroke ([place])    06/2014           . TIA (transient ischemic attack)   . TOBACCO USE, QUIT 04/12/2009  . WEAKNESS 11/09/2007    Past Surgical History:  Procedure Laterality Date  . CARDIAC CATHETERIZATION    . CATARACT EXTRACTION    . CHOLECYSTECTOMY    . ENDARTERECTOMY Left 11/13/2015   Procedure: LEFT CAROTID ARTERY ENDARTERECTOMY;  Surgeon: Conrad Truesdale, MD;  Location: [facility];  Service: Vascular;  Laterality: Left;  . KNEE SURGERY    . PATCH ANGIOPLASTY Left 11/13/2015   Procedure: WITH 1CM X 6CM  XENOSURE BIOLOGIC PATCH ANGIOPLASTY;  Surgeon: Conrad Kim, MD;  Location: [facility];  Service: Vascular;  Laterality: Left;  . TEE WITHOUT CARDIOVERSION N/A 06/24/2014   Procedure: TRANSESOPHAGEAL ECHOCARDIOGRAM (TEE);  Surgeon: Sanda Klein, MD;  Location: [facility] ENDOSCOPY;  Service: Cardiovascular;  Laterality: N/A;    Prior to Admission medications   Medication Sig Start Date End Date Taking? Authorizing Provider  ALPRAZolam Duanne Moron) 0.5 MG tablet Take 1 tablet (0.5 mg total) by mouth daily as needed for anxiety. 10/09/15  Yes Marletta Lor, MD  amLODipine (NORVASC) 10 MG tablet  Take 1 tablet (10 mg total) by mouth daily. 02/14/16  Yes Marletta Lor, MD  aspirin EC 81 MG tablet Take 1 tablet (81 mg total) by mouth daily. 11/23/15  Yes Alvia Grove, PA-C  atorvastatin (LIPITOR) 80 MG tablet Take 80 mg by mouth daily.   Yes Historical Provider, MD  clopidogrel (PLAVIX) 75 MG tablet Take 1 tablet (75 mg total) by mouth daily. 02/14/16  Yes Marletta Lor, MD  Fluticasone-Umeclidin-Vilant (TRELEGY ELLIPTA) 100-62.5-25 MCG/INH AEPB Inhale 1 puff into the lungs daily. 09/19/16  Yes Praveen Mannam, MD  hydrALAZINE (APRESOLINE) 25 MG tablet Take 3 tablets (75 mg total) by mouth 3 (three) times daily. 06/18/16   Yes Eileen Stanford, PA-C  levothyroxine (SYNTHROID, LEVOTHROID) 75 MCG tablet TAKE 1 TABLET BY MOUTH EVERY DAY 10/07/16  Yes Marletta Lor, MD  umeclidinium-vilanterol [facility] ELLIPTA) 62.5-25 MCG/INH AEPB Inhale 1 puff into the lungs daily. 09/25/16  Yes Praveen Mannam, MD  budesonide-formoterol (SYMBICORT) 80-4.5 MCG/ACT inhaler Inhale 2 puffs into the lungs 2 (two) times daily. Patient not taking: Reported on 10/09/2016 05/01/16   Marshell Garfinkel, MD  budesonide-formoterol (SYMBICORT) 80-4.5 MCG/ACT inhaler Inhale 2 puffs into the lungs 2 (two) times daily. Patient not taking: Reported on 10/09/2016 05/01/16   Marshell Garfinkel, MD  fluticasone (FLONASE) 50 MCG/ACT nasal spray Place into both nostrils daily.    Historical Provider, MD  Fluticasone-Umeclidin-Vilant (TRELEGY ELLIPTA) 100-62.5-25 MCG/INH AEPB Inhale 1 puff into the lungs daily. Patient not taking: Reported on 10/09/2016 08/06/16   Marshell Garfinkel, MD  hydrALAZINE (APRESOLINE) 50 MG tablet Take 1.5 tablets (75 mg total) by mouth 3 (three) times daily. Patient not taking: Reported on 10/09/2016 11/17/15   Clayborne Dana, MD  PROAIR HFA 108 [phone] Base) MCG/ACT inhaler INHALE 2 PUFFS INTO THE LUNGS EVERY 6 (SIX) HOURS AS NEEDED FOR WHEEZING OR SHORTNESS OF BREATH. 04/02/16   Marletta Lor, MD  Tiotropium Bromide Monohydrate (SPIRIVA RESPIMAT) 2.5 MCG/ACT AERS Inhale 2 puffs into the lungs daily. Patient not taking: Reported on 10/09/2016 05/01/16   Marshell Garfinkel, MD    Current Facility-Administered Medications  Medication Dose Route Frequency Provider Last Rate Last Dose  . acetaminophen (TYLENOL) tablet 650 mg  650 mg Oral Q6H PRN Doreatha Lew, MD       Or  . acetaminophen (TYLENOL) suppository 650 mg  650 mg Rectal Q6H PRN Doreatha Lew, MD      . ALPRAZolam Duanne Moron) tablet 0.5 mg  0.5 mg Oral Daily PRN Doreatha Lew, MD      . amLODipine (NORVASC) tablet 10 mg  10 mg Oral Daily Doreatha Lew, MD      . feeding  supplement (BOOST / RESOURCE BREEZE) liquid 1 Container  1 Container Oral TID BM Doreatha Lew, MD      . guaiFENesin-dextromethorphan (ROBITUSSIN DM) 100-10 MG/5ML syrup 5 mL  5 mL Oral Q4H PRN Doreatha Lew, MD      . hydrALAZINE (APRESOLINE) tablet 75 mg  75 mg Oral TID Doreatha Lew, MD   75 mg at 10/09/16 2259  . levothyroxine (SYNTHROID, LEVOTHROID) tablet 75 mcg  75 mcg Oral Daily Doreatha Lew, MD   75 mcg at 10/10/16 [phone]  . ondansetron (ZOFRAN) tablet 4 mg  4 mg Oral Q6H PRN Doreatha Lew, MD       Or  . ondansetron [facility]) injection 4 mg  4 mg Intravenous Q6H PRN Doreatha Lew, MD      .  pantoprazole (PROTONIX) injection 40 mg  40 mg Intravenous Q12H Doreatha Lew, MD      . umeclidinium-vilanterol [facility] ELLIPTA) 62.5-25 MCG/INH 1 puff  1 puff Inhalation Daily Doreatha Lew, MD   1 puff at 10/10/16 0831    Allergies as of 10/09/2016 - Review Complete 10/09/2016  Allergen Reaction Noted  . Catapres [clonidine hcl] Other (See Comments) 10/27/2015  . Lisinopril Anaphylaxis 06/22/2014  . Labetalol hcl Other (See Comments) 10/04/2015    Family History  Problem Relation Age of Onset  . Cancer Maternal Grandmother     stomach    Social History   Social History  . Marital status: Divorced    Spouse name: N/A  . Number of children: 4  . Years of education: college   Occupational History  . N/A    Social History Main Topics  . Smoking status: Former Smoker    Quit date: 07/08/1978  . Smokeless tobacco: Never Used  . Alcohol use 4.2 oz/week    7 Glasses of wine per week     Comment: "red wine"  . Drug use: No  . Sexual activity: Not on file   Other Topics Concern  . Not on file   Social History Narrative      Patient is right handed.   Patient drinks 1 cup caffeine daily.    Review of Systems: ROS is negative except as mentioned in HPI.  Physical Exam: Vital signs in last 24 hours: Temp:  [97.7 F (36.5 C)-98.9 F  (37.2 C)] 98.1 F (36.7 C) (04/05 0325) Pulse Rate:  [69-98] 69 (04/05 0600) Resp:  [15-23] 18 (04/05 0600) BP: (127-177)/(34-80) 160/44 (04/05 0600) SpO2:  [93 %-100 %] 97 % (04/05 0833) Weight:  [133 lb (60.3 kg)-134 lb 14.7 oz (61.2 kg)] 134 lb 14.7 oz (61.2 kg) (04/04 2216) Last BM Date: 10/05/16 (per patient) General:  Alert, Well-developed, well-nourished, pleasant and cooperative in NAD Head:  Normocephalic and atraumatic. Eyes:  Sclera clear, no icterus.  Conjunctiva pink. Ears:  Normal auditory acuity. Mouth:  No deformity or lesions.   Lungs:  Clear throughout to auscultation.  No wheezes, crackles, or rhonchi.  No increased WOB. Heart:  Regular rate and rhythm; no murmurs, clicks, rubs, or gallops. Abdomen:  Soft, non-distended.  BS present.  Non-tender Rectal:  Deferred.  Was hemoccult positive in the ED.  Msk:  Symmetrical without gross deformities. Pulses:  Normal pulses noted. Extremities:  Without clubbing or edema. Neurologic:  Alert and  oriented x4;  grossly normal neurologically. Skin:  Intact without significant lesions or rashes. Psych:  Alert and cooperative. Normal mood and affect.  Intake/Output from previous day: 04/04 0701 - 04/05 0700 In: 1344.7 [I.V.:45; Blood:1299.7] Out: 2000 [Urine:2000]  Lab Results:  Recent Labs  10/09/16 1421 10/09/16 1744 10/10/16 0629  WBC 4.8 5.0 5.1  HGB 4.6 Repeated and verified X2.* 4.1* 8.1*  HCT 14.6 Repeated and verified X2.* 13.6* 25.0*  PLT 300.0 276 215   BMET  Recent Labs  10/09/16 1421 10/09/16 1744 10/10/16 0629  NA 136 135 137  K 4.1 3.8 3.5  CL 103 103 106  CO2 26 23 25   GLUCOSE 138* 123* 95  BUN 23 26* 24*  CREATININE 1.33* 1.36* 1.37*  CALCIUM 8.8 8.8* 8.3*   LFT  Recent Labs  10/09/16 1744  PROT 6.5  ALBUMIN 3.8  AST 21  ALT 18  ALKPHOS 102  BILITOT 0.4   Studies/Results: Dg Chest 2 View  Result Date:  10/09/2016 CLINICAL DATA:  Shortness of breath over the past 2-3 days  especially with activity. History of COPD, former smoker. EXAM: CHEST  2 VIEW COMPARISON:  Chest x-ray of December 11, 2015 and chest CT scan of April 05, 2016. FINDINGS: The lungs are hyperinflated with hemidiaphragm flattening. There is subtle increased density peripherally in the left upper lung. There is hazy increased density in the right infrahilar region which is new. There is chronic blunting of the costophrenic angles. Subtle nodularity on the left inferior to the larger area of consolidation is stable. The heart and pulmonary vascularity are normal. There is calcification in the wall of the aortic arch. The bony thorax exhibits no acute abnormality. IMPRESSION: COPD. Hazy increased density peripherally in the left mid lung may reflect pneumonia. Coarse right infrahilar lung markings are consistent with atelectasis or early interstitial pneumonia. Followup PA and lateral chest X-ray is recommended in 3-4 weeks following trial of antibiotic therapy to ensure resolution and exclude underlying malignancy. Stable subcentimeter nodules in the left mid lung. Thoracic aortic atherosclerosis. Electronically Signed   By: David  Martinique M.D.   On: 10/09/2016 15:18   IMPRESSION:  *83 year old female with profound symptomatic anemia with Hgb 4.1 grams.  Hemoccult positive and reports dark stools recently in the setting of anti-platelet use.  Hgb was 11.9 grams 10 months ago.  Suspect sub-acute UGIB.  Rule out ulcer, malignancy, etc.  Hgb responded well to 3 units PRBC's and is up to 8.1 grams this AM. -History of CVA requiring chronic antiplatelet use with Plavix  PLAN: -Will plan for EGd 4/6 at 8 AM. -Full liquid diet today then NPO after midnight. -Continue pantoprazole 40 mg IV BID for now. -Monitor Hgb and transfuse further prn.   ZEHR, JESSICA D.  10/10/2016, 9:08 AM  Pager number [phone]   Attending physician's note   I have taken a history, examined the patient and reviewed the chart. I agree  with the Advanced Practitioner's note, impression and recommendations. 83 year old female admitted with severe symptomatic anemia with history of melena for past few days concerning for upper GI bleed. We'll proceed with EGD for evaluation. Hemoglobin responded appropriately to blood transfusion. Currently hemodynamically stable. Plavix last dose was yesterday morning.  PPI twice daily The risks and benefits as well as alternatives of endoscopic procedure(s) have been discussed and reviewed. All questions answered. The patient agrees to proceed.  Damaris Hippo, MD [phone] Mon-Fri 8a-5p [phone] after 5p, weekends, holidays

## 2016-10-11 NOTE — Discharge Instructions (Signed)
Gastrointestinal Bleeding °Gastrointestinal bleeding is bleeding somewhere along the path food travels through the body (digestive tract). This path is anywhere between the mouth and the opening of the butt (anus). You may have blood in your poop (stools) or have black poop. If you throw up (vomit), there may be blood in it. °This condition can be mild, serious, or even life-threatening. If you have a lot of bleeding, you may need to stay in the hospital. °Follow these instructions at home: °· Take over-the-counter and prescription medicines only as told by your doctor. °· Eat foods that have a lot of fiber in them. These foods include whole grains, fruits, and vegetables. You can also try eating 1-3 prunes each day. °· Drink enough fluid to keep your pee (urine) clear or pale yellow. °· Keep all follow-up visits as told by your doctor. This is important. °Contact a doctor if: °· Your symptoms do not get better. °Get help right away if: °· Your bleeding gets worse. °· You feel dizzy or you pass out (faint). °· You feel weak. °· You have very bad cramps in your back or belly (abdomen). °· You pass large clumps of blood (clots) in your poop. °· Your symptoms are getting worse. °This information is not intended to replace advice given to you by your health care provider. Make sure you discuss any questions you have with your health care provider. °Document Released: 04/02/2008 Document Revised: 11/30/2015 Document Reviewed: 12/12/2014 °Elsevier Interactive Patient Education © 2017 [employer] Inc[employer] ° °

## 2016-10-11 NOTE — Op Note (Signed)
[facility] Patient Name: [name] Procedure Date: 10/11/2016 MRN: [id number] Attending MD: Mauri Pole , MD Date of Birth: 08-27-1924 CSN: [id number] Age: 83 Admit Type: Inpatient Procedure:                Upper GI endoscopy Indications:              Active gastrointestinal bleeding, Suspected upper                            gastrointestinal bleeding Providers:                Mauri Pole, MD, Carolynn Comment, RN,                            William Dalton, Technician Referring MD:              Medicines:                Monitored Anesthesia Care Complications:            No immediate complications. Estimated Blood Loss:     Estimated blood loss was minimal. Procedure:                Pre-Anesthesia Assessment:                           - Prior to the procedure, a History and Physical                            was performed, and patient medications and                            allergies were reviewed. The patient's tolerance of                            previous anesthesia was also reviewed. The risks                            and benefits of the procedure and the sedation                            options and risks were discussed with the patient.                            All questions were answered, and informed consent                            was obtained. Prior Anticoagulants: The patient                            last took aspirin 3 days and Plavix (clopidogrel) 2                            days prior to the procedure. ASA Grade Assessment:  III - A patient with severe systemic disease. After                            reviewing the risks and benefits, the patient was                            deemed in satisfactory condition to undergo the                            procedure.                           After obtaining informed consent, the endoscope was                            passed under direct  vision. Throughout the                            procedure, the patient's blood pressure, pulse, and                            oxygen saturations were monitored continuously. The                            Endoscope was introduced through the mouth, and                            advanced to the second part of duodenum. The upper                            GI endoscopy was accomplished without difficulty.                            The patient tolerated the procedure well. Scope In: Scope Out: Findings:      One mild (non-circumferential scarring) benign-appearing, intrinsic       stenosis was found 36 to 37 cm from the incisors. This measured 2 cm       (inner diameter) and was traversed.      The exam of the esophagus was otherwise normal.      The stomach was normal.      Red blood was found in the duodenal bulb and in the second portion of       the duodenum.      A few less than 5 mm angioectasias with bleeding were found in the       second portion of the duodenum. Coagulation for hemostasis using argon       plasma was successful. On one of the larger AVM, for hemostasis, three       hemostatic clips were successfully placed (MR conditional). There was no       bleeding at the end of the procedure. Impression:               - Benign-appearing esophageal stenosis.                           - Normal  stomach.                           - Blood in the duodenal bulb and in the second                            portion of the duodenum.                           - A few bleeding angioectasias in the duodenum.                            Treated with argon plasma coagulation (APC). Clips                            (MR conditional) were placed.                           - No specimens collected. Moderate Sedation:      N/A- Per Anesthesia Care Recommendation:           - Clear liquid diet today, then advance as                            tolerated to mechanical soft diet for 2 weeks.                            - Continue present medications.                           - Avoid NSAID's                           - Monitor Hgb daily and transfuse as needed                           - IV iron infusionX1 during hospitalization                           - Start PO iron 325mg  TID with meals on discharge                           - Resume Plavix (clopidogrel) at prior dose in 5                            days. Refer to managing physician for further                            adjustment of therapy.                           - Resume aspirin at prior dose tomorrow. Refer to                            managing physician for further adjustment of  therapy.                           - Return to GI office at the next available                            appointment.                           -If Hgb stable with no evidence of further                            bleeding, ok to discharge home tomorrow Procedure Code(s):        --- Professional ---                           [phone], Esophagogastroduodenoscopy, flexible,                            transoral; with control of bleeding, any method Diagnosis Code(s):        --- Professional ---                           K22.2, Esophageal obstruction                           K92.2, Gastrointestinal hemorrhage, unspecified                           K31.811, Angiodysplasia of stomach and duodenum                            with bleeding CPT copyright 2016 [employer]. All rights reserved. The codes documented in this report are preliminary and upon coder review may  be revised to meet current compliance requirements. Mauri Pole, MD 10/11/2016 9:00:59 AM This report has been signed electronically. Number of Addenda: 0

## 2016-10-11 NOTE — Interval H&P Note (Signed)
History and Physical Interval Note:  10/11/2016 8:16 AM  [name]  has presented today for surgery, with the diagnosis of melana  The various methods of treatment have been discussed with the patient and family. After consideration of risks, benefits and other options for treatment, the patient has consented to  Procedure(s): ESOPHAGOGASTRODUODENOSCOPY (EGD) WITH PROPOFOL (N/A) as a surgical intervention .  The patient's history has been reviewed, patient examined, no change in status, stable for surgery.  I have reviewed the patient's chart and labs.  Questions were answered to the patient's satisfaction.     Kavitha Nandigam

## 2016-10-11 NOTE — Progress Notes (Signed)
PROGRESS NOTE [facility]   [name]   DPO:[id number] DOB: [birth year]  DOA: 10/09/2016 PCP: Nyoka Cowden, MD   Brief Narrative:  [name] is a 83 y.o. female with medical history significant of COPD, Stroke in 2015, HTN well controlled and Hypothyroidism. Presented to her PCP c/o weakness, SOB and dizziness. On lab work up she was found to have Hgb of 4.6 for what she was sent to the hospital.   Patient report that symptoms started 2 days PTA and 3 days PTA she had 2 BM with black stool. Patient has not have any BM since then. Denies gross bleed. No abdominal pain, nausea or vomiting. Patient's last colonoscopy was on 2010 which showed 2 polyps and mild diverticulosis. Patient on ASA and Plavix due to left carotid endarterectomy about a year ago. Patient admitted for suspected upper GID bleed, and was transfuse 3 units of PRBC's. S/p EGD 10/11/16  Subjective: Patient seen and examine have no complaints, s/p EGD. Tolerate well procedure   Assessment & Plan: Upper GI bleed - in setting of ASA and Plavix. Melena 3 days ago. Denies use of NSAID's. FOBT positive. BUN slight elevated  s/p EGD found angioectsia with bleeding treated with APC also found large AVM with was clipped. s/p 3 units PRBC's - Hgb stable  Continue Protonix 40 mg IV BID  Anemia panel - shows Iron deficiency - will give IV venofer prior to d/c - then Iron supplement TID  Avoid NSAID's  Clear liquids tonight, then advance to mechanical soft diet for 2 weeks  Follow up with GI at next available appointment  Agree with resuming ASA in am.  Hold Plavix until evaluated by vascular surgery - patient has an appointment next week.  If Hgb stable will d/c in AM   Iron def anemia and acute bleed  See bove  HTN - BP stable Continue home meds - Hydralazine and Norvasc Monitor BP   COPD stable  No active wheezing  Continue Anoro   Hypothyroidism  Continue Synthroid  Acute on CKD stage III -  Likely due to hypoperfusion - Cr improving after IVF  Baseline Cr 1.09 Check BMP in AM   DVT prophylaxis: SCD's  Code Status: FULL  Family Communication: With daughter via phone   Disposition Plan: Likely home after EGD if no complications   Consultants:   GI - [place]   Procedures:     Antimicrobials:      Objective: Vitals:   10/11/16 0910 10/11/16 0920 10/11/16 0945 10/11/16 1435  BP: (!) 120/47 (!) 145/39 (!) 131/56 139/62  Pulse:   76 80  Resp: 12 19 18 18   Temp:   97.6 F (36.4 C) 98 F (36.7 C)  TempSrc:   Oral Oral  SpO2: 95% 96% 97% 98%  Weight:      Height:        Intake/Output Summary (Last 24 hours) at 10/11/16 1707 Last data filed at 10/11/16 1436  Gross per 24 hour  Intake             1920 ml  Output             1900 ml  Net               20 ml   Filed Weights   10/09/16 1700 10/09/16 2216 10/11/16 0722  Weight: 60.3 kg (133 lb) 61.2 kg (134 lb 14.7 oz) 60.8 kg (134 lb)    Examination:  General exam: NAD  Respiratory system: CTA  Cardiovascular system: S1S2 RRR Gastrointestinal system: Abd soft NTND Central nervous system: Alert  Extremities: No LE edema.   Data Reviewed: I have personally reviewed following labs and imaging studies  CBC:  Recent Labs Lab 10/09/16 1421 10/09/16 1744 10/10/16 0629 10/10/16 1348 10/11/16 0505  WBC 4.8 5.0 5.1 4.9 4.2  NEUTROABS 3.3 3.4 3.3 3.4 2.5  HGB 4.6 Repeated and verified X2.* 4.1* 8.1* 8.7* 7.7*  HCT 14.6 Repeated and verified X2.* 13.6* 25.0* 26.1* 23.6*  MCV 69.6 Repeated and verified X2.* 72.0* 76.0* 77.9* 76.6*  PLT 300.0 276 215 225 485   Basic Metabolic Panel:  Recent Labs Lab 10/09/16 1421 10/09/16 1744 10/10/16 0629 10/11/16 0505  NA 136 135 137 139  K 4.1 3.8 3.5 3.5  CL 103 103 106 110  CO2 26 23 25 23   GLUCOSE 138* 123* 95 115*  BUN 23 26* 24* 22*  CREATININE 1.33* 1.36* 1.37* 1.21*  CALCIUM 8.8 8.8* 8.3* 8.2*   GFR: Estimated Creatinine Clearance: 29.5 mL/min  (A) (by C-G formula based on SCr of 1.21 mg/dL (H)). Liver Function Tests:  Recent Labs Lab 10/09/16 1744  AST 21  ALT 18  ALKPHOS 102  BILITOT 0.4  PROT 6.5  ALBUMIN 3.8   No results for input(s): LIPASE, AMYLASE in the last 168 hours. No results for input(s): AMMONIA in the last 168 hours. Coagulation Profile: No results for input(s): INR, PROTIME in the last 168 hours. Cardiac Enzymes: No results for input(s): CKTOTAL, CKMB, CKMBINDEX, TROPONINI in the last 168 hours. BNP (last 3 results)  Recent Labs  12/11/15 1216  PROBNP 46.0   HbA1C: No results for input(s): HGBA1C in the last 72 hours. CBG: No results for input(s): GLUCAP in the last 168 hours. Lipid Profile: No results for input(s): CHOL, HDL, LDLCALC, TRIG, CHOLHDL, LDLDIRECT in the last 72 hours. Thyroid Function Tests:  Recent Labs  10/09/16 1421  TSH 2.69   Anemia Panel:  Recent Labs  10/09/16 1740 10/09/16 1744  VITAMINB12 326  --   FOLATE 11.0  --   FERRITIN 5*  --   TIBC 382  --   IRON 12*  --   RETICCTPCT  --  2.9   Sepsis Labs: No results for input(s): PROCALCITON, LATICACIDVEN in the last 168 hours.  Recent Results (from the past 240 hour(s))  MRSA PCR Screening     Status: None   Collection Time: 10/09/16 10:47 PM  Result Value Ref Range Status   MRSA by PCR NEGATIVE NEGATIVE Final    Comment:        The GeneXpert MRSA Assay (FDA approved for NASAL specimens only), is one component of a comprehensive MRSA colonization surveillance program. It is not intended to diagnose MRSA infection nor to guide or monitor treatment for MRSA infections.      Radiology Studies: No results found.   Scheduled Meds: . amLODipine  10 mg Oral Daily  . feeding supplement  1 Container Oral TID BM  . hydrALAZINE  75 mg Oral TID  . levothyroxine  75 mcg Oral Daily  . pantoprazole (PROTONIX) IV  40 mg Intravenous Q12H  . umeclidinium-vilanterol  1 puff Inhalation Daily   Continuous  Infusions:   LOS: 2 days    Chipper Oman, MD Pager: Text Page via [email or web address]  [phone]  If 7PM-7AM, please contact night-coverage [email or web address] Password [email or web address] 10/11/2016, 5:07 PM

## 2016-10-11 NOTE — Anesthesia Preprocedure Evaluation (Signed)
Anesthesia Evaluation  Patient identified by MRN, date of birth, ID band Patient awake    Reviewed: Allergy & Precautions, H&P , NPO status , Patient's Chart, lab work & pertinent test results  Airway Mallampati: II  TM Distance: >3 FB Neck ROM: Full    Dental no notable dental hx. (+) Edentulous Upper, Partial Lower, Dental Advisory Given   Pulmonary neg pulmonary ROS, former smoker,    Pulmonary exam normal breath sounds clear to auscultation       Cardiovascular hypertension, Pt. on medications + Peripheral Vascular Disease   Rhythm:Regular Rate:Normal     Neuro/Psych CVA, No Residual Symptoms negative psych ROS   GI/Hepatic negative GI ROS, Neg liver ROS,   Endo/Other  Hypothyroidism   Renal/GU negative Renal ROS  negative genitourinary   Musculoskeletal  (+) Arthritis ,   Abdominal   Peds  Hematology negative hematology ROS (+)   Anesthesia Other Findings   Reproductive/Obstetrics negative OB ROS                             Anesthesia Physical  Anesthesia Plan  ASA: III  Anesthesia Plan: MAC   Post-op Pain Management:    Induction: Intravenous  Airway Management Planned: Mask and Natural Airway  Additional Equipment:   Intra-op Plan:   Post-operative Plan:   Informed Consent: I have reviewed the patients History and Physical, chart, labs and discussed the procedure including the risks, benefits and alternatives for the proposed anesthesia with the patient or authorized representative who has indicated his/her understanding and acceptance.   Dental advisory given  Plan Discussed with: CRNA  Anesthesia Plan Comments:         Anesthesia Quick Evaluation

## 2016-10-11 NOTE — Transfer of Care (Signed)
Immediate Anesthesia Transfer of Care Note  Patient: [name]  Procedure(s) Performed: Procedure(s): ESOPHAGOGASTRODUODENOSCOPY (EGD) WITH PROPOFOL (N/A)  Patient Location: ENDO  Anesthesia Type:MAC  Level of Consciousness:  sedated, patient cooperative and responds to stimulation  Airway & Oxygen Therapy:Patient Spontanous Breathing and Patient connected to face mask oxgen  Post-op Assessment:  Report given to ENDO RN and Post -op Vital signs reviewed and stable  Post vital signs:  Reviewed and stable  Last Vitals:  Vitals:   10/11/16 0524 10/11/16 0722  BP: (!) 141/41 (!) 172/51  Pulse: 75 79  Resp: 18 17  Temp: 36.7 C 41.5 C    Complications: No apparent anesthesia complications

## 2016-10-12 DIAGNOSIS — N179 Acute kidney failure, unspecified: Secondary | ICD-10-CM

## 2016-10-12 DIAGNOSIS — N189 Chronic kidney disease, unspecified: Secondary | ICD-10-CM

## 2016-10-12 DIAGNOSIS — D500 Iron deficiency anemia secondary to blood loss (chronic): Secondary | ICD-10-CM

## 2016-10-12 LAB — BASIC METABOLIC PANEL
ANION GAP: 7 (ref 5–15)
BUN: 15 mg/dL (ref 6–20)
CALCIUM: 8.2 mg/dL — AB (ref 8.9–10.3)
CO2: 22 mmol/L (ref 22–32)
Chloride: 109 mmol/L (ref 101–111)
Creatinine, Ser: 1.19 mg/dL — ABNORMAL HIGH (ref 0.44–1.00)
GFR, EST AFRICAN AMERICAN: 46 mL/min — AB (ref >60)
GFR, EST NON AFRICAN AMERICAN: 39 mL/min — AB (ref >60)
GLUCOSE: 97 mg/dL (ref 65–99)
Potassium: 3.7 mmol/L (ref 3.5–5.1)
Sodium: 138 mmol/L (ref 135–145)

## 2016-10-12 LAB — CBC
HCT: 23.2 % — ABNORMAL LOW (ref 36.0–46.0)
Hemoglobin: 7.4 g/dL — ABNORMAL LOW (ref 12.0–15.0)
MCH: 24.9 pg — ABNORMAL LOW (ref 26.0–34.0)
MCHC: 31.9 g/dL (ref 30.0–36.0)
MCV: 78.1 fL (ref 78.0–100.0)
PLATELETS: 215 10*3/uL (ref 150–400)
RBC: 2.97 MIL/uL — ABNORMAL LOW (ref 3.87–5.11)
RDW: 18.5 % — AB (ref 11.5–15.5)
WBC: 5.2 10*3/uL (ref 4.0–10.5)

## 2016-10-12 LAB — HEMOGLOBIN AND HEMATOCRIT, BLOOD
HEMATOCRIT: 27.7 % — AB (ref 36.0–46.0)
Hemoglobin: 9 g/dL — ABNORMAL LOW (ref 12.0–15.0)

## 2016-10-12 LAB — PREPARE RBC (CROSSMATCH)

## 2016-10-12 MED ORDER — SODIUM CHLORIDE 0.9 % IV SOLN
Freq: Once | INTRAVENOUS | Status: AC
  Administered 2016-10-12: 11:00:00 via INTRAVENOUS

## 2016-10-12 MED ORDER — FERROUS SULFATE 325 (65 FE) MG PO TABS: 325.0000 mg | ORAL_TABLET | Freq: Three times a day (TID) | ORAL | 0 refills | Status: DC

## 2016-10-12 MED ORDER — PANTOPRAZOLE SODIUM 40 MG PO TBEC: 40.0000 mg | DELAYED_RELEASE_TABLET | Freq: Two times a day (BID) | ORAL | 0 refills | Status: DC

## 2016-10-12 NOTE — Progress Notes (Signed)
Assessment unchanged. Pt and daughters verbalized understanding of dc instructions through teach back including follow up care and when to call the doctor. Scripts x 2 given as provided by MD. Discharged via wc to front entrance to meet awaiting vehicle to carry home.

## 2016-10-12 NOTE — Progress Notes (Addendum)
[name] GASTROENTEROLOGY ROUNDING NOTE   Subjective: Patient feels well, no complaints. Hgb is slightly lower at 7.4 from 7.7 this AM. Received IV iron.  S/p EGD yesterday with small bowel AVM s/p APC and hemostatic clips  Objective: Vital signs in last 24 hours: Temp:  [97.6 F (36.4 C)-98.2 F (36.8 C)] 98.2 F (36.8 C) (04/07 0610) Pulse Rate:  [73-85] 73 (04/07 0610) Resp:  [12-19] 16 (04/07 0610) BP: (120-145)/(39-62) 130/57 (04/07 0610) SpO2:  [94 %-98 %] 96 % (04/07 0610) Last BM Date: 10/10/16 General: NAD Lungs: clear Heart: s1s2, murmur+ Abdomen: soft NTNT Ext: No edema    Intake/Output from previous day: 04/06 0701 - 04/07 0700 In: 1540 [P.O.:840; I.V.:700] Out: 1600 [Urine:1600] Intake/Output this shift: Total I/O In: -  Out: 300 [Urine:300]   Lab Results:  Recent Labs  10/10/16 1348 10/11/16 0505 10/12/16 0437  WBC 4.9 4.2 5.2  HGB 8.7* 7.7* 7.4*  PLT 225 208 215  MCV 77.9* 76.6* 78.1   BMET  Recent Labs  10/10/16 0629 10/11/16 0505 10/12/16 0437  NA 137 139 138  K 3.5 3.5 3.7  CL 106 110 109  CO2 25 23 22   GLUCOSE 95 115* 97  BUN 24* 22* 15  CREATININE 1.37* 1.21* 1.19*  CALCIUM 8.3* 8.2* 8.2*   LFT  Recent Labs  10/09/16 1744  PROT 6.5  ALBUMIN 3.8  AST 21  ALT 18  ALKPHOS 102  BILITOT 0.4     Assessment &Plan 83 -year-old female with history of COPD, TIA, carotid artery stenosis on aspirin and Plavix presented with melena and symptomatic anemia  On EGD noted multiple AVM in proximal duodenum, one of which was actively bleeding, treated with APC and clipped with hemostatic clips  She may have additional AVMs in the distal small bowel  Received IV iron infusion Okay to restart aspirin today Continue to hold Plavix for 5 days  Recheck hemoglobin this afternoon, if stable okay to discharge home  Patient will nead recheck of hemoglobin in a week by PMD Will need regular IV iron infusion as outpatient, follow-up  with hematology We'll schedule for GI office visit in next 4-6 weeks  If continues to have persistent anemia despite IV iron, will consider small bowel video capsule to further investigate as outpatient  Please call with any questions   K. Denzil Magnuson , MD [phone] Mon-Fri 8a-5p [phone] after 5p, weekends, holidays [facility]

## 2016-10-12 NOTE — Discharge Summary (Signed)
Physician Discharge Summary  [name]  FYB:[id number]  DOB: 1981-07-04  DOA: 10/09/2016 PCP: Gloria Cowden, MD  Admit date: 10/09/2016 Discharge date: 10/12/2016  Admitted From: Home  Disposition:  Home   Recommendations for Outpatient Follow-up:  1. Follow up with PCP in 1-2 weeks 2. Please obtain BMP/CBC in one week to monitor hemoglobin and Cr   Discharge Condition: Stable  CODE STATUS: FULL  Diet recommendation: Heart Healthy Soft Diet   Brief/Interim Summary: [name] a 83 y.o.femalewith medical history significant of COPD, Stroke in 2015, HTN well controlled and Hypothyroidism. Presented to her PCP c/o weakness, SOB and dizziness. On lab work up she was found to have Hgb of 4.6 for what she was sent to the hospital.   Patient report that symptoms started 2 days PTA and 3 days PTA she had 2 BM with black stool. Patient has not have any BM since then. Denies gross bleed. No abdominal pain, nausea or vomiting. Patient's last colonoscopy was on 2010 which showed 2 polyps and mild diverticulosis. Patient on ASA and Plavix due to left carotid endarterectomy about a year ago. Patient admitted for suspected upper GID bleed, and was transfuse 3 units of PRBC's. S/p EGD 10/11/16 which showed bleed angioectasia treated with APC and a large AVM which was clipped. After EGD patient was transfused 1 more unit of PRBC's. Patient also received Iron infusion x 1. Patient clinically improved and GI cleared to be discharge with follow up as outpatient.   Subjective: Patient seen and examined, no complains this AM. Tolerating diet well, denies any dark stools. Remains afebrile. Ambulating well.   Discharge Diagnoses/Hospital Course:  Upper GI bleed - in setting of ASA and Plavix. Melena 3 days ago. Denies use of NSAID's. FOBT positive. BUN slight elevated  s/p EGD found angioectsia with bleeding treated with APC also found large AVM with was clipped. s/p total of 4 units PRBC's  during hospital stay - Hgb 9.0 on discharge  Initially treated with Protonix 40 mg IV BID, transitioned to PO  Anemia panel - shows Iron deficiency - treated with IV venofer x 1 - then Iron supplement TID  Avoid NSAID's  Mechanical soft diet for 2 weeks  Follow up with GI in 4 weeks  Resume ASA   Hold Plavix until evaluated by vascular surgery - patient has an appointment next week.  Check Hgb in 1 week with PCP   Iron def anemia and acute bleed  See bove  HTN - BP stable Continue home meds - Hydralazine and Norvasc Monitor BP   COPD stable  No active wheezing  Continue Anoro  Follow up with Pulmonology   Hypothyroidism Continue Synthroid  Acute on CKD stage III- Likely due to hypoperfusion - Cr improved after IVF  Baseline Cr 1.09 Check BMP in 1 week   All other chronic medical condition were stable during the hospitalization.  On the day of the discharge the patient's vitals were stable, and no other acute medical condition were reported by patient. Patient was felt safe to be discharged to home  Discharge Instructions  You were cared for by a hospitalist during your hospital stay. If you have any questions about your discharge medications or the care you received while you were in the hospital after you are discharged, you can call the unit and asked to speak with the hospitalist on call if the hospitalist that took care of you is not available. Once you are discharged, your primary care physician  will handle any further medical issues. Please note that NO REFILLS for any discharge medications will be authorized once you are discharged, as it is imperative that you return to your primary care physician (or establish a relationship with a primary care physician if you do not have one) for your aftercare needs so that they can reassess your need for medications and monitor your lab values.  Discharge Instructions    Call MD for:  difficulty breathing, headache or visual  disturbances    Complete by:  As directed    Call MD for:  extreme fatigue    Complete by:  As directed    Call MD for:  hives    Complete by:  As directed    Call MD for:  persistant dizziness or light-headedness    Complete by:  As directed    Call MD for:  persistant nausea and vomiting    Complete by:  As directed    Call MD for:  redness, tenderness, or signs of infection (pain, swelling, redness, odor or green/yellow discharge around incision site)    Complete by:  As directed    Call MD for:  severe uncontrolled pain    Complete by:  As directed    Call MD for:  temperature >100.4    Complete by:  As directed    Diet - low sodium heart healthy    Complete by:  As directed    Mechanical Soft diet   Discharge instructions    Complete by:  As directed    Stop Plavix until seen by vascular surgeon  Can resume Aspirin  Check CBC in 1 week with PCP   Increase activity slowly    Complete by:  As directed      Allergies as of 10/12/2016      Reactions   Catapres [clonidine Hcl] Other (See Comments)   Made pt feel horrible, shaky, weak and nausea   Lisinopril Anaphylaxis   Tongue swelling   Labetalol Hcl Other (See Comments)   Caused bradycardia and syncope      Medication List    STOP taking these medications   budesonide-formoterol 80-4.5 MCG/ACT inhaler Commonly known as:  SYMBICORT   clopidogrel 75 MG tablet Commonly known as:  PLAVIX   Fluticasone-Umeclidin-Vilant 100-62.5-25 MCG/INH Aepb Commonly known as:  TRELEGY ELLIPTA   Tiotropium Bromide Monohydrate 2.5 MCG/ACT Aers Commonly known as:  SPIRIVA RESPIMAT     TAKE these medications   ALPRAZolam 0.5 MG tablet Commonly known as:  XANAX Take 1 tablet (0.5 mg total) by mouth daily as needed for anxiety.   amLODipine 10 MG tablet Commonly known as:  NORVASC Take 1 tablet (10 mg total) by mouth daily.   aspirin EC 81 MG tablet Take 1 tablet (81 mg total) by mouth daily.   atorvastatin 80 MG  tablet Commonly known as:  LIPITOR Take 80 mg by mouth daily.   fluticasone 50 MCG/ACT nasal spray Commonly known as:  FLONASE Place into both nostrils daily.   hydrALAZINE 25 MG tablet Commonly known as:  APRESOLINE Take 3 tablets (75 mg total) by mouth 3 (three) times daily. What changed:  Another medication with the same name was removed. Continue taking this medication, and follow the directions you see here.   levothyroxine 75 MCG tablet Commonly known as:  SYNTHROID, LEVOTHROID TAKE 1 TABLET BY MOUTH EVERY DAY   pantoprazole 40 MG tablet Commonly known as:  PROTONIX Take 1 tablet (40 mg total) by mouth  2 (two) times daily.   PROAIR HFA 108 (90 Base) MCG/ACT inhaler Generic drug:  albuterol INHALE 2 PUFFS INTO THE LUNGS EVERY 6 (SIX) HOURS AS NEEDED FOR WHEEZING OR SHORTNESS OF BREATH.   umeclidinium-vilanterol 62.5-25 MCG/INH Aepb Commonly known as:  ANORO ELLIPTA Inhale 1 puff into the lungs daily.      Follow-up Information    Gloria Cowden, MD. Schedule an appointment as soon as possible for a visit in 1 week(s).   Specialty:  Internal Medicine Why:  Check CBC  Contact information: [place] [address] [phone]        Harl Bowie, MD. Schedule an appointment as soon as possible for a visit in 3 week(s).   Specialty:  Gastroenterology Contact information: [place] [address] [phone]          Allergies  Allergen Reactions  . Catapres [Clonidine Hcl] Other (See Comments)    Made pt feel horrible, shaky, weak and nausea  . Lisinopril Anaphylaxis    Tongue swelling  . Labetalol Hcl Other (See Comments)    Caused bradycardia and syncope    Consultations:  GI - [place]   Procedures/Studies: Dg Chest 2 View  Result Date: 10/09/2016 CLINICAL DATA:  Shortness of breath over the past 2-3 days especially with activity. History of COPD, former smoker. EXAM: CHEST  2 VIEW COMPARISON:   Chest x-ray of December 11, 2015 and chest CT scan of April 05, 2016. FINDINGS: The lungs are hyperinflated with hemidiaphragm flattening. There is subtle increased density peripherally in the left upper lung. There is hazy increased density in the right infrahilar region which is new. There is chronic blunting of the costophrenic angles. Subtle nodularity on the left inferior to the larger area of consolidation is stable. The heart and pulmonary vascularity are normal. There is calcification in the wall of the aortic arch. The bony thorax exhibits no acute abnormality. IMPRESSION: COPD. Hazy increased density peripherally in the left mid lung may reflect pneumonia. Coarse right infrahilar lung markings are consistent with atelectasis or early interstitial pneumonia. Followup PA and lateral chest X-ray is recommended in 3-4 weeks following trial of antibiotic therapy to ensure resolution and exclude underlying malignancy. Stable subcentimeter nodules in the left mid lung. Thoracic aortic atherosclerosis. Electronically Signed   By: David  Martinique M.D.   On: 10/09/2016 15:18    Discharge Exam: Vitals:   10/12/16 1130 10/12/16 1430  BP: (!) 142/41 (!) 135/48  Pulse: 76 73  Resp: 18 20  Temp: 97.6 F (36.4 C) 98.2 F (36.8 C)   Vitals:   10/12/16 1057 10/12/16 1113 10/12/16 1130 10/12/16 1430  BP: (!) 144/45  (!) 142/41 (!) 135/48  Pulse: 74  76 73  Resp: 18  18 20   Temp: 98.3 F (36.8 C)  97.6 F (36.4 C) 98.2 F (36.8 C)  TempSrc: Oral Oral Oral Oral  SpO2: 97%  98% 97%  Weight:      Height:        General: Pt is alert, awake, not in acute distress Cardiovascular: RRR, S1/S2 +, no rubs, no gallops Respiratory: CTA bilaterally, no wheezing, no rhonchi Abdominal: Soft, NT, ND, bowel sounds + Extremities: no edema, no cyanosis   The results of significant diagnostics from this hospitalization (including imaging, microbiology, ancillary and laboratory) are listed below for reference.      Microbiology: Recent Results (from the past 240 hour(s))  MRSA PCR Screening     Status: None  Collection Time: 10/09/16 10:47 PM  Result Value Ref Range Status   MRSA by PCR NEGATIVE NEGATIVE Final    Comment:        The GeneXpert MRSA Assay (FDA approved for NASAL specimens only), is one component of a comprehensive MRSA colonization surveillance program. It is not intended to diagnose MRSA infection nor to guide or monitor treatment for MRSA infections.      Labs: BNP (last 3 results) No results for input(s): BNP in the last 8760 hours. Basic Metabolic Panel:  Recent Labs Lab 10/09/16 1421 10/09/16 1744 10/10/16 0629 10/11/16 0505 10/12/16 0437  NA 136 135 137 139 138  K 4.1 3.8 3.5 3.5 3.7  CL 103 103 106 110 109  CO2 26 23 25 23 22   GLUCOSE 138* 123* 95 115* 97  BUN 23 26* 24* 22* 15  CREATININE 1.33* 1.36* 1.37* 1.21* 1.19*  CALCIUM 8.8 8.8* 8.3* 8.2* 8.2*   Liver Function Tests:  Recent Labs Lab 10/09/16 1744  AST 21  ALT 18  ALKPHOS 102  BILITOT 0.4  PROT 6.5  ALBUMIN 3.8   No results for input(s): LIPASE, AMYLASE in the last 168 hours. No results for input(s): AMMONIA in the last 168 hours. CBC:  Recent Labs Lab 10/09/16 1421 10/09/16 1744 10/10/16 0629 10/10/16 1348 10/11/16 0505 10/12/16 0437 10/12/16 1535  WBC 4.8 5.0 5.1 4.9 4.2 5.2  --   NEUTROABS 3.3 3.4 3.3 3.4 2.5  --   --   HGB 4.6 Repeated and verified X2.* 4.1* 8.1* 8.7* 7.7* 7.4* 9.0*  HCT 14.6 Repeated and verified X2.* 13.6* 25.0* 26.1* 23.6* 23.2* 27.7*  MCV 69.6 Repeated and verified X2.* 72.0* 76.0* 77.9* 76.6* 78.1  --   PLT 300.0 276 215 225 208 215  --    Cardiac Enzymes: No results for input(s): CKTOTAL, CKMB, CKMBINDEX, TROPONINI in the last 168 hours. BNP: Invalid input(s): POCBNP CBG: No results for input(s): GLUCAP in the last 168 hours. D-Dimer No results for input(s): DDIMER in the last 72 hours. Hgb A1c No results for input(s): HGBA1C in the  last 72 hours. Lipid Profile No results for input(s): CHOL, HDL, LDLCALC, TRIG, CHOLHDL, LDLDIRECT in the last 72 hours. Thyroid function studies No results for input(s): TSH, T4TOTAL, T3FREE, THYROIDAB in the last 72 hours.  Invalid input(s): FREET3 Anemia work up  Recent Labs  10/09/16 1740 10/09/16 1744  VITAMINB12 326  --   FOLATE 11.0  --   FERRITIN 5*  --   TIBC 382  --   IRON 12*  --   RETICCTPCT  --  2.9   Urinalysis    Component Value Date/Time   COLORURINE YELLOW 12/11/2015 1216   APPEARANCEUR CLEAR 12/11/2015 1216   LABSPEC 1.010 12/11/2015 1216   PHURINE 6.5 12/11/2015 1216   GLUCOSEU NEGATIVE 12/11/2015 1216   [place] 12/11/2015 1216   [place] 12/11/2015 1216   BILIRUBINUR n 08/17/2012 1455   KETONESUR NEGATIVE 12/11/2015 1216   PROTEINUR NEGATIVE 11/21/2015 1325   UROBILINOGEN 0.2 12/11/2015 1216   NITRITE NEGATIVE 12/11/2015 1216   LEUKOCYTESUR TRACE (A) 12/11/2015 1216   Sepsis Labs Invalid input(s): PROCALCITONIN,  WBC,  LACTICIDVEN Microbiology Recent Results (from the past 240 hour(s))  MRSA PCR Screening     Status: None   Collection Time: 10/09/16 10:47 PM  Result Value Ref Range Status   MRSA by PCR NEGATIVE NEGATIVE Final    Comment:        The GeneXpert MRSA Assay (  FDA approved for NASAL specimens only), is one component of a comprehensive MRSA colonization surveillance program. It is not intended to diagnose MRSA infection nor to guide or monitor treatment for MRSA infections.     Time coordinating discharge: 40 minutes  SIGNED:  Chipper Oman, MD  [facility] 10/12/2016, 4:11 PM  Pager please text page via  [email or web address] Password [email or web address]

## 2016-10-12 NOTE — Progress Notes (Signed)
Dr. Quincy Simmonds aware diastolic BP running in upper 40's to mid 50's this shift yet asymptomatic. Pt preparing to dc home, no c/o dizziness or lightheadedness. Order received to hold afternoon dose of Hydralazine.

## 2016-10-12 NOTE — Progress Notes (Signed)
Dr. Quincy Simmonds aware of HBG level at 9.0 via text. MD called back and entered discharge orders.

## 2016-10-14 ENCOUNTER — Encounter: Payer: Self-pay | Admitting: Internal Medicine

## 2016-10-14 LAB — TYPE AND SCREEN
ABO/RH(D): A NEG
ANTIBODY SCREEN: NEGATIVE
UNIT DIVISION: 0
UNIT DIVISION: 0
Unit division: 0
Unit division: 0

## 2016-10-14 LAB — BPAM RBC
BLOOD PRODUCT EXPIRATION DATE: 201804202359
BLOOD PRODUCT EXPIRATION DATE: 201804242359
Blood Product Expiration Date: 201804212359
Blood Product Expiration Date: 201804242359
ISSUE DATE / TIME: 201804042257
ISSUE DATE / TIME: 201804050115
ISSUE DATE / TIME: 201804041941
ISSUE DATE / TIME: 201804071058
UNIT TYPE AND RH: 600
UNIT TYPE AND RH: 600
Unit Type and Rh: 600
Unit Type and Rh: 600

## 2016-10-15 ENCOUNTER — Telehealth: Payer: Self-pay

## 2016-10-15 ENCOUNTER — Encounter (HOSPITAL_COMMUNITY): Payer: Self-pay | Admitting: Gastroenterology

## 2016-10-15 NOTE — Telephone Encounter (Signed)
D/C 10/12/16 To: home  Spoke with pt and she reports that she is doing well. She denies any s/s of anemia at this time. She has no questions or concerns.  Appt scheduled with [name], NP 10/17/16, pt aware.   Transition Care Management Follow-up Telephone Call  How have you been since you were released from the hospital? Very good   Do you understand why you were in the hospital? yes   Do you understand the discharge instrcutions? yes  Items Reviewed:  Medications reviewed: yes  Allergies reviewed: yes  Dietary changes reviewed: yes  Referrals reviewed: yes   Functional Questionnaire:   Activities of Daily Living (ADLs):   She states they are independent in the following: ambulation, bathing and hygiene, feeding, continence, grooming, toileting and dressing States they require assistance with the following: none   Any transportation issues/concerns?: no   Any patient concerns? no   Confirmed importance and date/time of follow-up visits scheduled: yes   Confirmed with patient if condition begins to worsen call PCP or go to the ER.  Patient was given the Call-a-Nurse line [phone]: yes

## 2016-10-15 NOTE — Progress Notes (Addendum)
Established Carotid Patient  History of Present Illness  [name] is a 83 y.o. ([birth year]) female who presents with chief complaint: none.  Pt is s/p L CEA (11/13/15).  This was complicated with possible TE from extensive aortic arch atherosclerosis.  The patient is reluctant to start anticoagulation.  She elected to take ASA and Plavix instead.  Recently pt was admitted with severe anemia.  She taken off Plavix and told to continue on ASA 81 mg PO daily.  She had some altered voice post-op.  Reported ENT notes L vocal cord dysmotility.  This has completely resolved  Previous carotid studies demonstrated: RICA <36% stenosis, LICA >64% stenosis.  Patient has history of stroke symptom.  The patient has never had amaurosis fugax or monocular blindness.  The patient has never had facial drooping or hemiplegia.  She had right hand weakness and paraesthesia.  The patient had transient expressive aphasia.  The patient's previous neurologic deficits have  resolved.  The patient's PMH, PSH, SH, and FamHx are unchanged from 01/03/16.  Current Outpatient Prescriptions  Medication Sig Dispense Refill  . ALPRAZolam (XANAX) 0.5 MG tablet Take 1 tablet (0.5 mg total) by mouth daily as needed for anxiety. 30 tablet 1  . amLODipine (NORVASC) 10 MG tablet Take 1 tablet (10 mg total) by mouth daily. 90 tablet 11  . aspirin EC 81 MG tablet Take 1 tablet (81 mg total) by mouth daily. 30 tablet 0  . atorvastatin (LIPITOR) 80 MG tablet Take 80 mg by mouth daily.    . ferrous sulfate (FERROUSUL) 325 (65 FE) MG tablet Take 1 tablet (325 mg total) by mouth 3 (three) times daily with meals. 90 tablet 0  . fluticasone (FLONASE) 50 MCG/ACT nasal spray Place into both nostrils daily.    . hydrALAZINE (APRESOLINE) 25 MG tablet Take 3 tablets (75 mg total) by mouth 3 (three) times daily. 270 tablet 4  . levothyroxine (SYNTHROID, LEVOTHROID) 75 MCG tablet TAKE 1 TABLET BY MOUTH EVERY DAY 90 tablet 1  . pantoprazole  (PROTONIX) 40 MG tablet Take 1 tablet (40 mg total) by mouth 2 (two) times daily. 60 tablet 0  . PROAIR HFA 108 (90 Base) MCG/ACT inhaler INHALE 2 PUFFS INTO THE LUNGS EVERY 6 (SIX) HOURS AS NEEDED FOR WHEEZING OR SHORTNESS OF BREATH. 8.5 Inhaler 5  . umeclidinium-vilanterol (ANORO ELLIPTA) 62.5-25 MCG/INH AEPB Inhale 1 puff into the lungs daily. 60 each 5   No current facility-administered medications for this visit.     Allergies  Allergen Reactions  . Catapres [Clonidine Hcl] Other (See Comments)    Made pt feel horrible, shaky, weak and nausea  . Lisinopril Anaphylaxis    Tongue swelling  . Labetalol Hcl Other (See Comments)    Caused bradycardia and syncope    On ROS today: resolution of swallow issues, resolution of vocal issues   Physical Examination  Vitals:   10/18/16 0951 10/18/16 0953  BP: (!) 167/73 (!) 166/75  Pulse: 71   Resp: 18   Temp: 97.1 F (36.2 C)   TempSrc: Oral   SpO2: 97%   Weight: 135 lb (61.2 kg)   Height: 5\' 6"  (1.676 m)     Body mass index is 21.79 kg/m.  General: Alert, O x 3, WD,NAD  Eyes: PERRLA, EOMI,   Neck: Supple, mid-line trachea,  inc well healed, voice normal  Pulmonary: Sym exp, good B air movt,CTA B  Cardiac: RRR, Nl S1, S2, no Murmurs, No rubs, No S3,S4  Vascular: Vessel Right Left  Radial Palpable Palpable  Brachial Palpable Palpable  Carotid Palpable, No Bruit Palpable, No Bruit  Aorta Not palpable N/A  Femoral Palpable Palpable  Popliteal Not palpable Not palpable  PT Not palpable Not palpable  DP Faintly palpable Faintly palpable   Gastrointestinal: soft, non-distended, non-tender to palpation, No guarding or rebound, no HSM, no masses, no CVAT B, No palpable prominent aortic pulse,    Musculoskeletal: M/S 5/5 throughout  , Extremities without ischemic changes  , No edema present,  , No LDS present  Neurologic: CN 2-12 intact, Pain and light touch intact in extremities, Motor exam as listed  above   Non-Invasive Vascular Imaging  CAROTID DUPLEX (Date: 10/18/2016 ):   R ICA stenosis: 1-39%  R VA: patent and antegrade  L ICA stenosis: mild hyperplasia, <1-39%  L VA: patent and antegrade   Medical Decision Making  [name] is a 83 y.o. female who presents with: R sx ICA stenosis >80%, extensive aortic arch atherosclerosis, UGIB   Based on the patient's vascular studies and examination, I have offered the patient: annual B carotid duplex.  I discussed in depth with the patient the nature of atherosclerosis, and emphasized the importance of maximal medical management including strict control of blood pressure, blood glucose, and lipid levels, antiplatelet agents, obtaining regular exercise, and cessation of smoking.    The patient is aware that without maximal medical management the underlying atherosclerotic disease process will progress, limiting the benefit of any interventions. The patient is currently on a statin: Lipitor.  The patient is currently on an anti-platelet: ASA.  Given the recent significant GI bleed, it is reasonable to continue only ASA.  The patient is still at risk for embolism from the aortic arch atherosclerosis, but it is hard to justify anticoagulation and resuming Plavix given the recent GI bleeding.  Thank you for allowing [place] to participate in this patient's care.   Adele Barthel, MD, FACS [facility] Office: [phone] Pager: [phone] --   VASCULAR QUALITY INITIATIVE FOLLOW UP DATA:   Current smoker: [  ] yes  [ x] no  Living status: [x  ]  Home  [  ] Nursing home  [  ] Homeless    MEDS:  ASA [ x ] yes  [  ] no- [  ] medical reason  [  ] non compliant  STATIN  [ x] yes  [  ] no- [  ] medical reason  [  ] non compliant  Beta blocker [  ] yes  [ x ] no- [  ] medical reason  [  ] non compliant  ACE inhibitor [  ] yes  [ x ] no- [  ] medical reason  [  ] non compliant  P2Y12 Antagonist [x   ] none  [  ] clopidogrel-Plavix  [  ] ticlopidine-Ticlid   [  ] prasugrel-Effient  [  ] ticagrelor- Brilinta    Anticoagulant [ x ] None  [  ] warfarin  [  ] rivaroxaban-Xarelto [  ] dabigatran- Pradaxa  Neurologic event since D/C:  [  x] no  [  ] yes: [  ] eye event  [  ] cortical event  [  ] VB event  [  ] non specific event  [  ] right  [  ] left  [  ] TIA  [  ] stroke  Date:   Modified Rankin Score:  0  MI since D/C: [ x ] no  [  ] troponin only  [  ] EKG or clinical  Cranial nerve injury: [  ] none  [ x ] resolved  [  ] persistent  Duplex CEA site: [  ] no  [x  ] yes - PSV= 79  EDV= 14  ICA/CCA ratio: 0.61  Stenosis= [  ] <40% [  ] 40-59% [  ] 60-79%  [  ] > 80%  [  ]  Occluded  CEA site re-operation:  [ x ] no   [  ] yes- date of re-op:  CEA site PCI:   [ x ] no   [  ] yes- date of PCI:

## 2016-10-17 ENCOUNTER — Ambulatory Visit (INDEPENDENT_AMBULATORY_CARE_PROVIDER_SITE_OTHER): Payer: Medicare Other | Admitting: Adult Health

## 2016-10-17 VITALS — BP 152/60 | Temp 97.9°F | Wt 136.0 lb

## 2016-10-17 DIAGNOSIS — N183 Chronic kidney disease, stage 3 unspecified: Secondary | ICD-10-CM

## 2016-10-17 DIAGNOSIS — K922 Gastrointestinal hemorrhage, unspecified: Secondary | ICD-10-CM

## 2016-10-17 DIAGNOSIS — D500 Iron deficiency anemia secondary to blood loss (chronic): Secondary | ICD-10-CM

## 2016-10-17 LAB — CBC WITH DIFFERENTIAL/PLATELET
BASOS ABS: 0.1 10*3/uL (ref 0.0–0.1)
Basophils Relative: 1.4 % (ref 0.0–3.0)
EOS ABS: 0.3 10*3/uL (ref 0.0–0.7)
Eosinophils Relative: 5.3 % — ABNORMAL HIGH (ref 0.0–5.0)
HCT: 34.2 % — ABNORMAL LOW (ref 36.0–46.0)
HEMOGLOBIN: 11.1 g/dL — AB (ref 12.0–15.0)
Lymphocytes Relative: 11.9 % — ABNORMAL LOW (ref 12.0–46.0)
Lymphs Abs: 0.7 10*3/uL (ref 0.7–4.0)
MCHC: 32.5 g/dL (ref 30.0–36.0)
MCV: 83.5 fl (ref 78.0–100.0)
MONO ABS: 0.7 10*3/uL (ref 0.1–1.0)
Monocytes Relative: 13.2 % — ABNORMAL HIGH (ref 3.0–12.0)
Neutro Abs: 3.9 10*3/uL (ref 1.4–7.7)
Neutrophils Relative %: 68.2 % (ref 43.0–77.0)
Platelets: 272 10*3/uL (ref 150.0–400.0)
RBC: 4.1 Mil/uL (ref 3.87–5.11)
RDW: 23.1 % — ABNORMAL HIGH (ref 11.5–15.5)
WBC: 5.7 10*3/uL (ref 4.0–10.5)

## 2016-10-17 LAB — BASIC METABOLIC PANEL
BUN: 18 mg/dL (ref 6–23)
CO2: 27 meq/L (ref 19–32)
Calcium: 8.8 mg/dL (ref 8.4–10.5)
Chloride: 102 mEq/L (ref 96–112)
Creatinine, Ser: 1.32 mg/dL — ABNORMAL HIGH (ref 0.40–1.20)
GFR: 40.25 mL/min — AB (ref >60.00)
GLUCOSE: 120 mg/dL — AB (ref 70–99)
POTASSIUM: 3.9 meq/L (ref 3.5–5.1)
SODIUM: 136 meq/L (ref 135–145)

## 2016-10-17 NOTE — Progress Notes (Signed)
Subjective:    Patient ID: [name], female    DOB: September 26, 1924, 83 y.o.   MRN: [id number]  HPI  83 year old female who  has a past medical history of Anxiety; Aortic arch atherosclerosis ([place]) (06/24/2014); Atherosclerotic ulcer of aorta ([place]) (06/24/2014); DISC DISEASE, LUMBAR (12/16/2008); DIVERTICULOSIS, COLON (09/30/2008); DYSPNEA (07/13/2008); Graves disease; History of embolic stroke (6/50/3546); HYPERLIPIDEMIA (03/06/2007); HYPERTENSION (03/06/2007); HYPOTHYROIDISM (10/13/2007); OSTEOARTHRITIS (03/06/2007); Personal history of colonic polyps (09/30/2008); Stroke [facility]); TIA (transient ischemic attack); TOBACCO USE, QUIT (04/12/2009); and WEAKNESS (11/09/2007).   She presents to the office today for follow up after being admitted to the hospital on 10/09/2016 and discharged on 10/12/2016.   Per hospital discharge note:   Gloria M Culveris a 82 y.o.femalewith medical history significant of COPD, Stroke in 2015, HTN well controlled and Hypothyroidism. Presented to her PCP c/o weakness, SOB and dizziness. On lab work up she was found to have Hgb of 4.6 for what she was sent to the hospital.   Patient report that symptoms started 2 days PTA and 3 days PTA she had 2 BM with black stool. Patient has not have any BM since then. Denies gross bleed. No abdominal pain, nausea or vomiting. Patient's last colonoscopy was on 2010 which showed 2 polyps and mild diverticulosis. Patient on ASA and Plavix due to left carotid endarterectomy about a year ago. Patient admitted for suspected upper GID bleed, and was transfuse 3 units of PRBC's. S/p EGD 10/11/16 which showed bleed angioectasia treated with APC and a large AVM which was clipped. After EGD patient was transfused 1 more unit of PRBC's. Patient also received Iron infusion x 1. Patient clinically improved and GI cleared to be discharge with follow up as outpatient.   Hospital Course as follows:   Upper GI bleed - in setting of ASA and Plavix. Melena 3 days ago.  Denies use of NSAID's. FOBT positive. BUN slight elevated  s/p EGD found angioectsia with bleeding treated with APC also found large AVM with was clipped. s/p total of 4 units PRBC's during hospital stay - Hgb 9.0 on discharge  Initially treated with Protonix 40 mg IV BID, transitioned to PO  Anemia panel - shows Iron deficiency - treated with IV venofer x 1 - then Iron supplement TID  Avoid NSAID's  Mechanical soft diet for 2 weeks  Follow up with GI in 4 weeks  Resume ASA   Hold Plavix until evaluated by vascular surgery - patient has an appointment next week.  Check Hgb in 1 week with PCP   Iron def anemia and acute bleed  See bove  HTN - BP stable Continue home meds - Hydralazine and Norvasc Monitor BP   COPD stable  No active wheezing  Continue Anoro  Follow up with Pulmonology   Hypothyroidism Continue Synthroid  Acute on CKD stage III- Likely due to hypoperfusion - Cr improved after IVF  Baseline Cr 1.09 Check BMP in 1 week   Today in the office she reports that " I feel great". She denies any CP, SOB or Melena   She has a follow up appointment for next with with GI and will be seeing Vascular Surgery tomorrow.   She has no complaints today    Review of Systems See HPI   Past Medical History:  Diagnosis Date  . Anxiety   . Aortic arch atherosclerosis ([place]) 06/24/2014  . Atherosclerotic ulcer of aorta ([place]) 06/24/2014  . [place] DISEASE, LUMBAR 12/16/2008  . DIVERTICULOSIS, COLON 09/30/2008  .  DYSPNEA 07/13/2008  . Graves disease   . History of embolic stroke 0/62/6948   Left brain  . HYPERLIPIDEMIA 03/06/2007  . HYPERTENSION 03/06/2007  . HYPOTHYROIDISM 10/13/2007  . OSTEOARTHRITIS 03/06/2007  . Personal history of colonic polyps 09/30/2008  . Stroke ([place])    06/2014           . TIA (transient ischemic attack)   . TOBACCO USE, QUIT 04/12/2009  . WEAKNESS 11/09/2007    Social History   Social History  . Marital status: Divorced    Spouse name: N/A  .  Number of children: 4  . Years of education: college   Occupational History  . N/A    Social History Main Topics  . Smoking status: Former Smoker    Quit date: 07/08/1978  . Smokeless tobacco: Never Used  . Alcohol use 4.2 oz/week    7 Glasses of wine per week     Comment: "red wine"  . Drug use: No  . Sexual activity: Not on file   Other Topics Concern  . Not on file   Social History Narrative      Patient is right handed.   Patient drinks 1 cup caffeine daily.    Past Surgical History:  Procedure Laterality Date  . CARDIAC CATHETERIZATION    . CATARACT EXTRACTION    . CHOLECYSTECTOMY    . ENDARTERECTOMY Left 11/13/2015   Procedure: LEFT CAROTID ARTERY ENDARTERECTOMY;  Surgeon: Conrad Letts, MD;  Location: [facility];  Service: Vascular;  Laterality: Left;  . ESOPHAGOGASTRODUODENOSCOPY (EGD) WITH PROPOFOL N/A 10/11/2016   Procedure: ESOPHAGOGASTRODUODENOSCOPY (EGD) WITH PROPOFOL;  Surgeon: Mauri Pole, MD;  Location: [facility] ENDOSCOPY;  Service: Endoscopy;  Laterality: N/A;  . KNEE SURGERY    . PATCH ANGIOPLASTY Left 11/13/2015   Procedure: WITH 1CM X 6CM  XENOSURE BIOLOGIC PATCH ANGIOPLASTY;  Surgeon: Conrad Daviess, MD;  Location: [facility];  Service: Vascular;  Laterality: Left;  . TEE WITHOUT CARDIOVERSION N/A 06/24/2014   Procedure: TRANSESOPHAGEAL ECHOCARDIOGRAM (TEE);  Surgeon: Sanda Klein, MD;  Location: [facility] ENDOSCOPY;  Service: Cardiovascular;  Laterality: N/A;    Family History  Problem Relation Age of Onset  . Cancer Maternal Grandmother     stomach    Allergies  Allergen Reactions  . Catapres [Clonidine Hcl] Other (See Comments)    Made pt feel horrible, shaky, weak and nausea  . Lisinopril Anaphylaxis    Tongue swelling  . Labetalol Hcl Other (See Comments)    Caused bradycardia and syncope    Current Outpatient Prescriptions on File Prior to Visit  Medication Sig Dispense Refill  . ALPRAZolam (XANAX) 0.5 MG tablet Take 1 tablet (0.5 mg total) by mouth daily  as needed for anxiety. 30 tablet 1  . amLODipine (NORVASC) 10 MG tablet Take 1 tablet (10 mg total) by mouth daily. 90 tablet 11  . aspirin EC 81 MG tablet Take 1 tablet (81 mg total) by mouth daily. 30 tablet 0  . atorvastatin (LIPITOR) 80 MG tablet Take 80 mg by mouth daily.    . ferrous sulfate (FERROUSUL) 325 (65 FE) MG tablet Take 1 tablet (325 mg total) by mouth 3 (three) times daily with meals. 90 tablet 0  . fluticasone (FLONASE) 50 MCG/ACT nasal spray Place into both nostrils daily.    . hydrALAZINE (APRESOLINE) 25 MG tablet Take 3 tablets (75 mg total) by mouth 3 (three) times daily. 270 tablet 4  . levothyroxine (SYNTHROID, LEVOTHROID) 75 MCG tablet TAKE 1 TABLET BY  MOUTH EVERY DAY 90 tablet 1  . pantoprazole (PROTONIX) 40 MG tablet Take 1 tablet (40 mg total) by mouth 2 (two) times daily. 60 tablet 0  . PROAIR HFA 108 (90 Base) MCG/ACT inhaler INHALE 2 PUFFS INTO THE LUNGS EVERY 6 (SIX) HOURS AS NEEDED FOR WHEEZING OR SHORTNESS OF BREATH. 8.5 Inhaler 5  . umeclidinium-vilanterol (ANORO ELLIPTA) 62.5-25 MCG/INH AEPB Inhale 1 puff into the lungs daily. 60 each 5   No current facility-administered medications on file prior to visit.     BP (!) 152/60 (BP Location: Left Arm, Patient Position: Sitting, Cuff Size: Normal)   Temp 97.9 F (36.6 C) (Oral)   Wt 136 lb (61.7 kg)   BMI 21.95 kg/m       Objective:   Physical Exam  Constitutional: She is oriented to person, place, and time. She appears well-developed and well-nourished. No distress.  Cardiovascular: Normal rate, regular rhythm, normal heart sounds and intact distal pulses.  Exam reveals no gallop and no friction rub.   No murmur heard. Pulmonary/Chest: Effort normal and breath sounds normal. No respiratory distress. She has no wheezes. She has no rales. She exhibits no tenderness.  Abdominal: Soft. Bowel sounds are normal. She exhibits no distension and no mass. There is no tenderness. There is no rebound and no guarding.   Neurological: She is alert and oriented to person, place, and time.  Skin: Skin is warm and dry. No rash noted. She is not diaphoretic. No erythema. No pallor.  Right leg wrapped in compression bandage s/p melanoma removal   Psychiatric: She has a normal mood and affect. Her behavior is normal. Judgment and thought content normal.  Nursing note and vitals reviewed.     Assessment & Plan:  1. Iron deficiency anemia due to chronic blood loss - Seems to be improved  - CBC with Differential/Platelet  2. Acute GI bleeding - resolved  - CBC with Differential/Platelet  3. Stage 3 chronic kidney disease  - Basic metabolic panel  [name], NP

## 2016-10-18 ENCOUNTER — Ambulatory Visit (INDEPENDENT_AMBULATORY_CARE_PROVIDER_SITE_OTHER): Payer: Medicare Other | Admitting: Vascular Surgery

## 2016-10-18 ENCOUNTER — Ambulatory Visit (HOSPITAL_COMMUNITY)
Admission: RE | Admit: 2016-10-18 | Discharge: 2016-10-18 | Disposition: A | Discharge status: Home / Self Care | Payer: Medicare Other | Source: Ambulatory Visit | Attending: Vascular Surgery | Admitting: Vascular Surgery

## 2016-10-18 ENCOUNTER — Encounter: Payer: Self-pay | Admitting: Vascular Surgery

## 2016-10-18 VITALS — BP 166/75 | HR 71 | Temp 97.1°F | Resp 18 | Ht 66.0 in | Wt 135.0 lb

## 2016-10-18 DIAGNOSIS — I739 Peripheral vascular disease, unspecified: Secondary | ICD-10-CM

## 2016-10-18 DIAGNOSIS — I6529 Occlusion and stenosis of unspecified carotid artery: Secondary | ICD-10-CM

## 2016-10-18 DIAGNOSIS — I779 Disorder of arteries and arterioles, unspecified: Secondary | ICD-10-CM | POA: Diagnosis not present

## 2016-10-18 DIAGNOSIS — I700 Atherosclerosis of aorta: Secondary | ICD-10-CM

## 2016-10-23 ENCOUNTER — Ambulatory Visit (INDEPENDENT_AMBULATORY_CARE_PROVIDER_SITE_OTHER): Payer: Medicare Other | Admitting: Physician Assistant

## 2016-10-23 ENCOUNTER — Encounter: Payer: Self-pay | Admitting: Physician Assistant

## 2016-10-23 ENCOUNTER — Other Ambulatory Visit (INDEPENDENT_AMBULATORY_CARE_PROVIDER_SITE_OTHER): Payer: Medicare Other

## 2016-10-23 VITALS — BP 150/60 | HR 76 | Ht 66.0 in | Wt 133.0 lb

## 2016-10-23 DIAGNOSIS — K558 Other vascular disorders of intestine: Secondary | ICD-10-CM

## 2016-10-23 DIAGNOSIS — D649 Anemia, unspecified: Secondary | ICD-10-CM

## 2016-10-23 DIAGNOSIS — K922 Gastrointestinal hemorrhage, unspecified: Secondary | ICD-10-CM

## 2016-10-23 DIAGNOSIS — K5520 Angiodysplasia of colon without hemorrhage: Secondary | ICD-10-CM

## 2016-10-23 LAB — CBC WITH DIFFERENTIAL/PLATELET
Basophils Absolute: 0.1 10*3/uL (ref 0.0–0.1)
Basophils Relative: 1.5 % (ref 0.0–3.0)
EOS PCT: 5.7 % — AB (ref 0.0–5.0)
Eosinophils Absolute: 0.3 10*3/uL (ref 0.0–0.7)
HEMATOCRIT: 36.2 % (ref 36.0–46.0)
HEMOGLOBIN: 11.6 g/dL — AB (ref 12.0–15.0)
Lymphocytes Relative: 19 % (ref 12.0–46.0)
Lymphs Abs: 1.1 10*3/uL (ref 0.7–4.0)
MCHC: 32.1 g/dL (ref 30.0–36.0)
MCV: 84 fl (ref 78.0–100.0)
MONOS PCT: 9.1 % (ref 3.0–12.0)
Monocytes Absolute: 0.5 10*3/uL (ref 0.1–1.0)
Neutro Abs: 3.8 10*3/uL (ref 1.4–7.7)
Neutrophils Relative %: 64.7 % (ref 43.0–77.0)
Platelets: 285 10*3/uL (ref 150.0–400.0)
RBC: 4.31 Mil/uL (ref 3.87–5.11)
RDW: 23.2 % — ABNORMAL HIGH (ref 11.5–15.5)
WBC: 5.9 10*3/uL (ref 4.0–10.5)

## 2016-10-23 NOTE — Patient Instructions (Signed)
Take iron 325 mg twice a daily.   Your physician has requested that you go to the basement for the following lab work before leaving today: CBC  CBC every 2 weeks  11/06/16 11/20/16 12/04/16

## 2016-10-23 NOTE — Progress Notes (Signed)
Subjective:    Patient ID: [name], female    DOB: [birth year], 83 y.o.   MRN: [id number]  HPI [name] is a pleasant 83 year old white female, recently known to Dr. Silverio Decamp from hospital consultation. Patient comes in today for post hospital follow-up after admission for [id number] with weakness shortness of breath and profound anemia with hemoglobin of 4.1. She was found to be iron deficient, and had reported very dark tarry stools over about a week prior to admission. She was given 3 units of packed RBCs with hemoglobin up to 8.1. She had been on aspirin and Plavix with history of bilateral carotid disease. She underwent EGD on 10/11/2016 with finding of a few less than 5 mm duodenal AVMs. These were treated with APC, and one of the larger AVMs was cauterized and then endoclipped. Patient was discharged from the hospital on baby aspirin was to hold her Plavix until she went back to see her vascular surgeon Dr. Bridgett Larsson. She comes in today reporting that she saw Dr. Bridgett Larsson in that he was fine with her staying off of Plavix. She says she feels fine energy level is good but worries about persistently dark stools now that she is on iron. She has not seen any tarry stool. Other medical problems include hypertension, prior history of CVA, COPD, and stage III chronic kidney disease. She also has known diverticulosis and last colonoscopy was done in May 2010 per Dr. Delfin Edis 2 polyps were removed which were hyperplastic and noted mild diverticulosis. Hemoglobin was checked on 10/17/2016 now up to 11.1/hematocrit 34.2. Patient wants to know if she can take iron twice daily instead of 3 times daily as she feels this is too much. She has been very constipated on oral iron but has been using over-the-counter fiber supplements with good relief. Review of Systems Pertinent positive and negative review of systems were noted in the above HPI section.  All other review of systems was otherwise negative.  Outpatient  Encounter Prescriptions as of 10/23/2016  Medication Sig  . ALPRAZolam (XANAX) 0.5 MG tablet Take 1 tablet (0.5 mg total) by mouth daily as needed for anxiety.  [name] Kitchen amLODipine (NORVASC) 10 MG tablet Take 1 tablet (10 mg total) by mouth daily.  [name] Kitchen aspirin EC 81 MG tablet Take 1 tablet (81 mg total) by mouth daily.  [name] Kitchen atorvastatin (LIPITOR) 80 MG tablet Take 80 mg by mouth daily.  . ferrous sulfate (FERROUSUL) 325 (65 FE) MG tablet Take 1 tablet (325 mg total) by mouth 3 (three) times daily with meals.  . hydrALAZINE (APRESOLINE) 25 MG tablet Take 3 tablets (75 mg total) by mouth 3 (three) times daily.  [name] Kitchen levothyroxine (SYNTHROID, LEVOTHROID) 75 MCG tablet TAKE 1 TABLET BY MOUTH EVERY DAY  . pantoprazole (PROTONIX) 40 MG tablet Take 1 tablet (40 mg total) by mouth 2 (two) times daily.  [name] Kitchen PROAIR HFA 108 (90 Base) MCG/ACT inhaler INHALE 2 PUFFS INTO THE LUNGS EVERY 6 (SIX) HOURS AS NEEDED FOR WHEEZING OR SHORTNESS OF BREATH.  . umeclidinium-vilanterol (ANORO ELLIPTA) 62.5-25 MCG/INH AEPB Inhale 1 puff into the lungs daily.  . [DISCONTINUED] fluticasone (FLONASE) 50 MCG/ACT nasal spray Place into both nostrils daily.   No facility-administered encounter medications on file as of 10/23/2016.    Allergies  Allergen Reactions  . Catapres [Clonidine Hcl] Other (See Comments)    Made pt feel horrible, shaky, weak and nausea  . Lisinopril Anaphylaxis    Tongue swelling  . Labetalol Hcl Other (See Comments)  Caused bradycardia and syncope   Patient Active Problem List   Diagnosis Date Noted  . Symptomatic anemia   . AVM (arteriovenous malformation) of duodenum, acquired with hemorrhage   . Acute GI bleeding 10/09/2016  . Other emphysema ([place]) 05/10/2016  . Recurrent laryngeal neuropathy 02/01/2016  . Nausea with vomiting 12/11/2015  . Left carotid stenosis 11/21/2015  . Asymptomatic carotid artery stenosis 11/13/2015  . Impaired glucose tolerance 10/27/2015  . Solitary pulmonary nodule  10/27/2015  . Thyroid nodule 10/27/2015  . Syncope 10/04/2015  . Bradycardia with less than 60 beats per minute 10/04/2015  . Paresthesia 10/03/2014  . History of embolic stroke 70/07/7492  . Paresthesia of both feet 07/06/2014  . Aortic arch atherosclerosis ([place]) 06/24/2014  . Atherosclerotic ulcer of aorta ([place]) 06/24/2014  . Stroke ([place]) 06/22/2014  . CVA (cerebral infarction) 06/22/2014  . Bilateral carotid artery disease ([place]) 05/18/2014  . TOBACCO USE, QUIT 04/12/2009  . [place] DISEASE, LUMBAR 12/16/2008  . DIVERTICULOSIS, COLON 09/30/2008  . Personal history of colonic polyps 09/30/2008  . DYSPNEA 07/13/2008  . Weakness 11/09/2007  . Hypothyroidism 10/13/2007  . Hyperlipidemia 03/06/2007  . Essential hypertension 03/06/2007  . Osteoarthritis 03/06/2007   Social History   Social History  . Marital status: Divorced    Spouse name: N/A  . Number of children: 4  . Years of education: college   Occupational History  . N/A    Social History Main Topics  . Smoking status: Former Smoker    Quit date: 07/08/1978  . Smokeless tobacco: Never Used  . Alcohol use Yes     Comment: "red wine"- some nights  . Drug use: No  . Sexual activity: Not on file   Other Topics Concern  . Not on file   Social History Narrative      Patient is right handed.   Patient drinks 1 cup caffeine daily.    Ms. Starrett family history includes Stomach cancer in her maternal grandmother.      Objective:    Vitals:   10/23/16 1117  BP: (!) 150/60  Pulse: 76    Physical Exam  well-developed elderly white female in no acute distress, appears younger than stated age blood pressure 150/60 pulse 76, height 5 foot 6, weight 133, BMI 21.4. HEENT; nontraumatic, cephalic EOMI PERRLA sclera anicteric, Cardiovascular ;regular rate and rhythm with S1-S2 she has some tenderness to palpation over the mid sternum. Pulmonary ;clear bilaterally, Abdomen; soft nontender nondistended bowel sounds are  active there is no palpable mass or hepatosplenomegaly, Rectal; exam not done, Ext;no clubbing cyanosis or edema skin warm and dry, Neuropsych; mood and affect appropriate       Assessment & Plan:   #54 83 year old white female with recent hospitalization for weakness shortness of breath and profound anemia. Patient had tarry stools prior to admission in setting of aspirin and Plavix. She was found to have several duodenal AVMs which were treated at EGD. Patient restarted baby aspirin, but at this time Plavix is being permanently discontinued. Patient has been stable since discharge from the hospital and hemoglobin 4 days ago up to 11.1. #2 iron deficiency anemia #3 bilateral carotid disease #4 history of CVA #5 COPD #6 chronic kidney disease stage III #7 hypertension #8 diverticulosis  Plan; patient will continue ferrous sulfate 325 mg by mouth twice daily. I explained to her that she will need to be on oral iron for 3-4 months to replace her iron stores. We'll plan to recheck iron studies in 4  months. She will continue the fiber laxative as needed A repeat hemoglobin today, and then plan to check every 2 weeks over the next couple of months to assure stability. I reassured her that she was less likely to have ongoing bleeding off of Plavix and since AVMs had been treated. She knows to call us should she have any evidence of tarry stools and/or she develops symptoms with recurrent weakness etc. to call to have her hemoglobin checked area She'll follow up with Dr. Silverio Decamp in 3 months.  Amy Genia Harold PA-C 10/23/2016   Cc: Marletta Lor, MD

## 2016-10-25 NOTE — Progress Notes (Signed)
Reviewed and agree with documentation and assessment and plan. K. Veena Levern Kalka , MD   

## 2016-10-30 ENCOUNTER — Encounter: Payer: Self-pay | Admitting: Physician Assistant

## 2016-10-30 ENCOUNTER — Telehealth: Payer: Self-pay | Admitting: Physician Assistant

## 2016-10-30 ENCOUNTER — Encounter: Payer: Self-pay | Admitting: Adult Health

## 2016-10-30 ENCOUNTER — Encounter (INDEPENDENT_AMBULATORY_CARE_PROVIDER_SITE_OTHER): Payer: Medicare Other

## 2016-10-30 DIAGNOSIS — K922 Gastrointestinal hemorrhage, unspecified: Secondary | ICD-10-CM | POA: Diagnosis not present

## 2016-10-30 DIAGNOSIS — K558 Other vascular disorders of intestine: Secondary | ICD-10-CM

## 2016-10-30 DIAGNOSIS — K5520 Angiodysplasia of colon without hemorrhage: Secondary | ICD-10-CM

## 2016-10-30 DIAGNOSIS — D649 Anemia, unspecified: Secondary | ICD-10-CM | POA: Diagnosis not present

## 2016-10-30 LAB — CBC WITH DIFFERENTIAL/PLATELET
BASOS ABS: 0.1 10*3/uL (ref 0.0–0.1)
Basophils Relative: 1.4 % (ref 0.0–3.0)
EOS ABS: 0.2 10*3/uL (ref 0.0–0.7)
Eosinophils Relative: 3.7 % (ref 0.0–5.0)
HEMATOCRIT: 36.4 % (ref 36.0–46.0)
HEMOGLOBIN: 11.8 g/dL — AB (ref 12.0–15.0)
LYMPHS PCT: 12.1 % (ref 12.0–46.0)
Lymphs Abs: 0.6 10*3/uL — ABNORMAL LOW (ref 0.7–4.0)
MCHC: 32.5 g/dL (ref 30.0–36.0)
MCV: 84.9 fl (ref 78.0–100.0)
Monocytes Absolute: 0.4 10*3/uL (ref 0.1–1.0)
Monocytes Relative: 8.1 % (ref 3.0–12.0)
Neutro Abs: 3.9 10*3/uL (ref 1.4–7.7)
Neutrophils Relative %: 74.7 % (ref 43.0–77.0)
PLATELETS: 283 10*3/uL (ref 150.0–400.0)
RBC: 4.29 Mil/uL (ref 3.87–5.11)
RDW: 22.6 % — ABNORMAL HIGH (ref 11.5–15.5)
WBC: 5.2 10*3/uL (ref 4.0–10.5)

## 2016-10-30 NOTE — Telephone Encounter (Signed)
Ok, thanks.

## 2016-10-30 NOTE — Telephone Encounter (Signed)
Spoke with the daughter. The patient had 4 loose or diarrhea stools yesterday. She also felt nauseated and did not eat well. Family encouraged her to go ahead to the lab for her CBC.  It looks like she has definitely been to the lab already today. Afebrile. Able to eat today.

## 2016-11-06 ENCOUNTER — Other Ambulatory Visit (INDEPENDENT_AMBULATORY_CARE_PROVIDER_SITE_OTHER): Payer: Medicare Other

## 2016-11-06 ENCOUNTER — Other Ambulatory Visit: Payer: Self-pay

## 2016-11-06 DIAGNOSIS — K922 Gastrointestinal hemorrhage, unspecified: Secondary | ICD-10-CM | POA: Diagnosis not present

## 2016-11-06 DIAGNOSIS — K558 Other vascular disorders of intestine: Secondary | ICD-10-CM | POA: Diagnosis not present

## 2016-11-06 DIAGNOSIS — D649 Anemia, unspecified: Secondary | ICD-10-CM | POA: Diagnosis not present

## 2016-11-06 DIAGNOSIS — K5520 Angiodysplasia of colon without hemorrhage: Secondary | ICD-10-CM

## 2016-11-06 LAB — CBC WITH DIFFERENTIAL/PLATELET
BASOS ABS: 0.1 10*3/uL (ref 0.0–0.1)
Basophils Relative: 2.3 % (ref 0.0–3.0)
Eosinophils Absolute: 0.3 10*3/uL (ref 0.0–0.7)
Eosinophils Relative: 5.8 % — ABNORMAL HIGH (ref 0.0–5.0)
HEMATOCRIT: 35.8 % — AB (ref 36.0–46.0)
Hemoglobin: 11.8 g/dL — ABNORMAL LOW (ref 12.0–15.0)
LYMPHS PCT: 19.9 % (ref 12.0–46.0)
Lymphs Abs: 1.1 10*3/uL (ref 0.7–4.0)
MCHC: 32.8 g/dL (ref 30.0–36.0)
MCV: 84.2 fl (ref 78.0–100.0)
MONOS PCT: 12.1 % — AB (ref 3.0–12.0)
Monocytes Absolute: 0.7 10*3/uL (ref 0.1–1.0)
Neutro Abs: 3.4 10*3/uL (ref 1.4–7.7)
Neutrophils Relative %: 59.9 % (ref 43.0–77.0)
Platelets: 210 10*3/uL (ref 150.0–400.0)
RBC: 4.25 Mil/uL (ref 3.87–5.11)
RDW: 23.3 % — ABNORMAL HIGH (ref 11.5–15.5)
WBC: 5.6 10*3/uL (ref 4.0–10.5)

## 2016-11-07 ENCOUNTER — Other Ambulatory Visit: Payer: Self-pay | Admitting: Internal Medicine

## 2016-11-15 ENCOUNTER — Other Ambulatory Visit: Payer: Self-pay | Admitting: Internal Medicine

## 2016-11-19 ENCOUNTER — Encounter: Payer: Self-pay | Admitting: Internal Medicine

## 2016-11-19 ENCOUNTER — Ambulatory Visit (INDEPENDENT_AMBULATORY_CARE_PROVIDER_SITE_OTHER): Payer: Medicare Other | Admitting: Internal Medicine

## 2016-11-19 VITALS — BP 148/68 | HR 115 | Temp 97.8°F | Ht 66.0 in | Wt 131.8 lb

## 2016-11-19 DIAGNOSIS — I10 Essential (primary) hypertension: Secondary | ICD-10-CM

## 2016-11-19 DIAGNOSIS — K31811 Angiodysplasia of stomach and duodenum with bleeding: Secondary | ICD-10-CM

## 2016-11-19 DIAGNOSIS — L03115 Cellulitis of right lower limb: Secondary | ICD-10-CM

## 2016-11-19 MED ORDER — CEPHALEXIN 500 MG PO CAPS: 500.0000 mg | ORAL_CAPSULE | Freq: Three times a day (TID) | ORAL | 0 refills | Status: DC

## 2016-11-19 NOTE — Patient Instructions (Addendum)
WE NOW OFFER   [facility] FAST TRACK!!!  SAME DAY Appointments for ACUTE CARE  Such as: Sprains, Injuries, cuts, abrasions, rashes, muscle pain, joint pain, back pain Colds, flu, sore throats, headache, allergies, cough, fever  Ear pain, sinus and eye infections Abdominal pain, nausea, vomiting, diarrhea, upset stomach Animal/insect bites  3 Easy Ways to Schedule: Walk-In Scheduling Call in scheduling Mychart Sign-up: [email or web address]         Cellulitis, Adult Cellulitis is a skin infection. The infected area is usually red and tender. This condition occurs most often in the arms and lower legs. The infection can travel to the muscles, blood, and underlying tissue and become serious. It is very important to get treated for this condition. What are the causes? Cellulitis is caused by bacteria. The bacteria enter through a break in the skin, such as a cut, burn, insect bite, open sore, or crack. What increases the risk? This condition is more likely to occur in people who:  Have a weak defense system (immune system).  Have open wounds on the skin such as cuts, burns, bites, and scrapes. Bacteria can enter the body through these open wounds.  Are older.  Have diabetes.  Have a type of long-lasting (chronic) liver disease (cirrhosis) or kidney disease.  Use IV drugs. What are the signs or symptoms? Symptoms of this condition include:  Redness, streaking, or spotting on the skin.  Swollen area of the skin.  Tenderness or pain when an area of the skin is touched.  Warm skin.  Fever.  Chills.  Blisters. How is this diagnosed? This condition is diagnosed based on a medical history and physical exam. You may also have tests, including:  Blood tests.  Lab tests.  Imaging tests. How is this treated? Treatment for this condition may include:  Medicines, such as antibiotic medicines or antihistamines.  Supportive care, such as rest  and application of cold or warm cloths (cold or warm compresses) to the skin.  Hospital care, if the condition is severe. The infection usually gets better within 1-2 days of treatment. Follow these instructions at home:  Take over-the-counter and prescription medicines only as told by your health care provider.  If you were prescribed an antibiotic medicine, take it as told by your health care provider. Do not stop taking the antibiotic even if you start to feel better.  Drink enough fluid to keep your urine clear or pale yellow.  Do not touch or rub the infected area.  Raise (elevate) the infected area above the level of your heart while you are sitting or lying down.  Apply warm or cold compresses to the affected area as told by your health care provider.  Keep all follow-up visits as told by your health care provider. This is important. These visits let your health care provider make sure a more serious infection is not developing. Contact a health care provider if:  You have a fever.  Your symptoms do not improve within 1-2 days of starting treatment.  Your bone or joint underneath the infected area becomes painful after the skin has healed.  Your infection returns in the same area or another area.  You notice a swollen bump in the infected area.  You develop new symptoms.  You have a general ill feeling (malaise) with muscle aches and pains. Get help right away if:  Your symptoms get worse.  You feel very sleepy.  You develop vomiting or diarrhea that persists.  You notice red streaks  coming from the infected area.  Your red area gets larger or turns dark in color. This information is not intended to replace advice given to you by your health care provider. Make sure you discuss any questions you have with your health care provider. Document Released: 04/03/2005 Document Revised: 11/02/2015 Document Reviewed: 05/03/2015 Elsevier Interactive Patient Education   2017 [employer].

## 2016-11-19 NOTE — Progress Notes (Signed)
Subjective:    Patient ID: [name], female    DOB: Sep 19, 1924, 83 y.o.   MRN: [id number]  HPI  83 year old patient who presents with a two-week history of redness and slight swelling involving the right leg after trauma.  She states that she struck a sharp edge of a door sustaining a laceration to the right lower leg area.  It initially led profusely.  She has been applying topical antibiotic ointment.  No further drainage, but the patient has had worsening redness and excessive warmth.  It has been mildly tender  She has a history of GI bleeding in the setting of dual antiplatelet drug therapy.  She has been resumed on aspirin and Plavix has been discontinued.  She is followed every 2 weeks and hemoglobin has been stable  Past Medical History:  Diagnosis Date  . Anxiety   . Aortic arch atherosclerosis ([place]) 06/24/2014  . Atherosclerotic ulcer of aorta ([place]) 06/24/2014  . Carotid artery occlusion   . [place] DISEASE, LUMBAR 12/16/2008  . DIVERTICULOSIS, COLON 09/30/2008  . DYSPNEA 07/13/2008  . Graves disease   . History of embolic stroke 2/53/6644   Left brain  . HYPERLIPIDEMIA 03/06/2007  . HYPERTENSION 03/06/2007  . HYPOTHYROIDISM 10/13/2007  . OSTEOARTHRITIS 03/06/2007  . Personal history of colonic polyps 09/30/2008  . Stroke ([place])    06/2014           . TIA (transient ischemic attack)   . TOBACCO USE, QUIT 04/12/2009  . WEAKNESS 11/09/2007     Social History   Social History  . Marital status: Divorced    Spouse name: N/A  . Number of children: 4  . Years of education: college   Occupational History  . N/A    Social History Main Topics  . Smoking status: Former Smoker    Quit date: 07/08/1978  . Smokeless tobacco: Never Used  . Alcohol use Yes     Comment: "red wine"- some nights  . Drug use: No  . Sexual activity: Not on file   Other Topics Concern  . Not on file   Social History Narrative      Patient is right handed.   Patient drinks 1 cup caffeine daily.      Past Surgical History:  Procedure Laterality Date  . CARDIAC CATHETERIZATION    . CATARACT EXTRACTION Bilateral   . CHOLECYSTECTOMY    . ENDARTERECTOMY Left 11/13/2015   Procedure: LEFT CAROTID ARTERY ENDARTERECTOMY;  Surgeon: Conrad South Park, MD;  Location: [facility];  Service: Vascular;  Laterality: Left;  . ESOPHAGOGASTRODUODENOSCOPY (EGD) WITH PROPOFOL N/A 10/11/2016   Procedure: ESOPHAGOGASTRODUODENOSCOPY (EGD) WITH PROPOFOL;  Surgeon: Mauri Pole, MD;  Location: [facility] ENDOSCOPY;  Service: Endoscopy;  Laterality: N/A;  . KNEE SURGERY    . PATCH ANGIOPLASTY Left 11/13/2015   Procedure: WITH 1CM X 6CM  XENOSURE BIOLOGIC PATCH ANGIOPLASTY;  Surgeon: Conrad Meridian, MD;  Location: [facility];  Service: Vascular;  Laterality: Left;  . TEE WITHOUT CARDIOVERSION N/A 06/24/2014   Procedure: TRANSESOPHAGEAL ECHOCARDIOGRAM (TEE);  Surgeon: Sanda Klein, MD;  Location: [facility] ENDOSCOPY;  Service: Cardiovascular;  Laterality: N/A;    Family History  Problem Relation Age of Onset  . Stomach cancer Maternal Grandmother   . Colon cancer Neg Hx     Allergies  Allergen Reactions  . Catapres [Clonidine Hcl] Other (See Comments)    Made pt feel horrible, shaky, weak and nausea  . Lisinopril Anaphylaxis    Tongue swelling  .  Labetalol Hcl Other (See Comments)    Caused bradycardia and syncope    Current Outpatient Prescriptions on File Prior to Visit  Medication Sig Dispense Refill  . ALPRAZolam (XANAX) 0.5 MG tablet Take 1 tablet (0.5 mg total) by mouth daily as needed for anxiety. 30 tablet 1  . amLODipine (NORVASC) 10 MG tablet Take 1 tablet (10 mg total) by mouth daily. 90 tablet 11  . aspirin EC 81 MG tablet Take 1 tablet (81 mg total) by mouth daily. 30 tablet 0  . atorvastatin (LIPITOR) 80 MG tablet Take 80 mg by mouth daily.    [name] Kitchen atorvastatin (LIPITOR) 80 MG tablet TAKE 1 TABLET (80 MG TOTAL) BY MOUTH DAILY AT 6 PM. 90 tablet 2  . ferrous sulfate 325 (65 FE) MG tablet TAKE 1 TABLET BY MOUTH 3  TIMES A DAY WITH MEALS 90 tablet 3  . hydrALAZINE (APRESOLINE) 25 MG tablet Take 3 tablets (75 mg total) by mouth 3 (three) times daily. 270 tablet 4  . levothyroxine (SYNTHROID, LEVOTHROID) 75 MCG tablet TAKE 1 TABLET BY MOUTH EVERY DAY 90 tablet 1  . pantoprazole (PROTONIX) 40 MG tablet TAKE 1 TABLET BY MOUTH TWICE A DAY 60 tablet 2  . PROAIR HFA 108 (90 Base) MCG/ACT inhaler INHALE 2 PUFFS INTO THE LUNGS EVERY 6 (SIX) HOURS AS NEEDED FOR WHEEZING OR SHORTNESS OF BREATH. 8.5 Inhaler 5  . umeclidinium-vilanterol (ANORO ELLIPTA) 62.5-25 MCG/INH AEPB Inhale 1 puff into the lungs daily. 60 each 5   No current facility-administered medications on file prior to visit.     BP (!) 148/68 (BP Location: Left Arm, Patient Position: Sitting, Cuff Size: Normal)   Pulse (!) 115   Temp 97.8 F (36.6 C) (Oral)   Ht 5\' 6"  (1.676 m)   Wt 131 lb 12.8 oz (59.8 kg)   SpO2 98%   BMI 21.27 kg/m     Review of Systems  Constitutional: Positive for unexpected weight change.  HENT: Negative for congestion, dental problem, hearing loss, rhinorrhea, sinus pressure, sore throat and tinnitus.   Eyes: Negative for pain, discharge and visual disturbance.  Respiratory: Negative for cough and shortness of breath.   Cardiovascular: Negative for chest pain, palpitations and leg swelling.  Gastrointestinal: Negative for abdominal distention, abdominal pain, blood in stool, constipation, diarrhea, nausea and vomiting.  Genitourinary: Negative for difficulty urinating, dysuria, flank pain, frequency, hematuria, pelvic pain, urgency, vaginal bleeding, vaginal discharge and vaginal pain.  Musculoskeletal: Negative for arthralgias, gait problem and joint swelling.  Skin: Negative for rash.  Neurological: Negative for dizziness, syncope, speech difficulty, weakness, numbness and headaches.  Hematological: Negative for adenopathy.  Psychiatric/Behavioral: Negative for agitation, behavioral problems and dysphoric mood. The  patient is not nervous/anxious.        Objective:   Physical Exam  Constitutional: She appears well-developed and well-nourished.  Cardiovascular: Normal rate and regular rhythm.   Pulmonary/Chest: Effort normal and breath sounds normal.  Skin:  Crusted area involving the outer aspect of the mid calf area with surrounding erythema.  No drainage or fluctuance Excessive warmth with mild tenderness          Assessment & Plan:   Early cellulitis, right lower leg.  Will elevate and continue local topical therapy.  Will treat with cephalexin 500 mg 3 times a day Hypertension History GI bleed.  Patient scheduled for follow-up CBC tomorrow Weight loss.  Patient does have a good appetite and seems be consuming adequate calories.  We'll continue to observe.  Follow-up  3 months   [name]

## 2016-11-20 ENCOUNTER — Other Ambulatory Visit (INDEPENDENT_AMBULATORY_CARE_PROVIDER_SITE_OTHER): Payer: Medicare Other

## 2016-11-20 ENCOUNTER — Other Ambulatory Visit: Payer: Self-pay | Admitting: Physician Assistant

## 2016-11-20 ENCOUNTER — Ambulatory Visit: Payer: Medicare Other | Admitting: Gastroenterology

## 2016-11-20 DIAGNOSIS — D649 Anemia, unspecified: Secondary | ICD-10-CM | POA: Diagnosis not present

## 2016-11-20 LAB — CBC WITH DIFFERENTIAL/PLATELET
BASOS ABS: 0.1 10*3/uL (ref 0.0–0.1)
Basophils Relative: 1.1 % (ref 0.0–3.0)
Eosinophils Absolute: 0.3 10*3/uL (ref 0.0–0.7)
Eosinophils Relative: 4.8 % (ref 0.0–5.0)
HCT: 38.5 % (ref 36.0–46.0)
Hemoglobin: 12.8 g/dL (ref 12.0–15.0)
LYMPHS ABS: 1 10*3/uL (ref 0.7–4.0)
Lymphocytes Relative: 16.8 % (ref 12.0–46.0)
MCHC: 33.2 g/dL (ref 30.0–36.0)
MCV: 86.3 fl (ref 78.0–100.0)
MONO ABS: 0.8 10*3/uL (ref 0.1–1.0)
Monocytes Relative: 12.4 % — ABNORMAL HIGH (ref 3.0–12.0)
NEUTROS ABS: 3.9 10*3/uL (ref 1.4–7.7)
NEUTROS PCT: 64.9 % (ref 43.0–77.0)
PLATELETS: 221 10*3/uL (ref 150.0–400.0)
RBC: 4.46 Mil/uL (ref 3.87–5.11)
RDW: 23.2 % — ABNORMAL HIGH (ref 11.5–15.5)
WBC: 6 10*3/uL (ref 4.0–10.5)

## 2016-12-02 ENCOUNTER — Other Ambulatory Visit: Payer: Self-pay | Admitting: Physician Assistant

## 2016-12-02 DIAGNOSIS — F419 Anxiety disorder, unspecified: Secondary | ICD-10-CM

## 2016-12-02 DIAGNOSIS — I739 Peripheral vascular disease, unspecified: Secondary | ICD-10-CM

## 2016-12-02 DIAGNOSIS — R0989 Other specified symptoms and signs involving the circulatory and respiratory systems: Secondary | ICD-10-CM

## 2016-12-02 DIAGNOSIS — R911 Solitary pulmonary nodule: Secondary | ICD-10-CM

## 2016-12-02 DIAGNOSIS — I779 Disorder of arteries and arterioles, unspecified: Secondary | ICD-10-CM

## 2016-12-02 DIAGNOSIS — Z8673 Personal history of transient ischemic attack (TIA), and cerebral infarction without residual deficits: Secondary | ICD-10-CM

## 2016-12-04 ENCOUNTER — Other Ambulatory Visit: Payer: Self-pay

## 2016-12-04 ENCOUNTER — Other Ambulatory Visit (INDEPENDENT_AMBULATORY_CARE_PROVIDER_SITE_OTHER): Payer: Medicare Other

## 2016-12-04 DIAGNOSIS — K558 Other vascular disorders of intestine: Secondary | ICD-10-CM | POA: Diagnosis not present

## 2016-12-04 DIAGNOSIS — K922 Gastrointestinal hemorrhage, unspecified: Secondary | ICD-10-CM | POA: Diagnosis not present

## 2016-12-04 DIAGNOSIS — D649 Anemia, unspecified: Secondary | ICD-10-CM

## 2016-12-04 DIAGNOSIS — K5520 Angiodysplasia of colon without hemorrhage: Secondary | ICD-10-CM

## 2016-12-04 LAB — CBC WITH DIFFERENTIAL/PLATELET
Basophils Absolute: 0.1 10*3/uL (ref 0.0–0.1)
Basophils Relative: 1.2 % (ref 0.0–3.0)
EOS ABS: 0.3 10*3/uL (ref 0.0–0.7)
Eosinophils Relative: 5.2 % — ABNORMAL HIGH (ref 0.0–5.0)
HEMATOCRIT: 37.5 % (ref 36.0–46.0)
Hemoglobin: 12.4 g/dL (ref 12.0–15.0)
LYMPHS PCT: 22.5 % (ref 12.0–46.0)
Lymphs Abs: 1.2 10*3/uL (ref 0.7–4.0)
MCHC: 33 g/dL (ref 30.0–36.0)
MCV: 87.1 fl (ref 78.0–100.0)
Monocytes Absolute: 0.8 10*3/uL (ref 0.1–1.0)
Monocytes Relative: 15 % — ABNORMAL HIGH (ref 3.0–12.0)
NEUTROS ABS: 3.1 10*3/uL (ref 1.4–7.7)
NEUTROS PCT: 56.1 % (ref 43.0–77.0)
PLATELETS: 234 10*3/uL (ref 150.0–400.0)
RBC: 4.31 Mil/uL (ref 3.87–5.11)
RDW: 21.3 % — ABNORMAL HIGH (ref 11.5–15.5)
WBC: 5.5 10*3/uL (ref 4.0–10.5)

## 2017-01-01 ENCOUNTER — Ambulatory Visit (INDEPENDENT_AMBULATORY_CARE_PROVIDER_SITE_OTHER): Payer: Medicare Other | Admitting: Gastroenterology

## 2017-01-01 ENCOUNTER — Encounter: Payer: Self-pay | Admitting: Gastroenterology

## 2017-01-01 VITALS — BP 150/62 | HR 72 | Ht 66.0 in | Wt 130.8 lb

## 2017-01-01 DIAGNOSIS — D508 Other iron deficiency anemias: Secondary | ICD-10-CM

## 2017-01-01 DIAGNOSIS — Z8774 Personal history of (corrected) congenital malformations of heart and circulatory system: Secondary | ICD-10-CM

## 2017-01-01 DIAGNOSIS — R634 Abnormal weight loss: Secondary | ICD-10-CM

## 2017-01-01 MED ORDER — PANTOPRAZOLE SODIUM 20 MG PO TBEC: 20.0000 mg | DELAYED_RELEASE_TABLET | Freq: Every day | ORAL | 3 refills | Status: DC

## 2017-01-01 NOTE — Patient Instructions (Signed)
You will need labs drawn in 2 months   Decrease Protonix to 20 mg daily   Follow up in 3 months

## 2017-01-01 NOTE — Progress Notes (Signed)
[name]    [id number]    1925-01-24  Primary Care Physician:Kwiatkowski, Doretha Sou, MD  Referring Physician: Marletta Lor, MD [place], [place] [address]  Chief complaint: Anemia, weight loss  HPI:  82 year old female here for follow-up visit. She was last seen by Nicoletta Ba 10/23/2016. She was hospitalized with severe anemia in the setting of bleeding AVM in April 2018. She underwent EGD during the hospitalization with duodenal AVM active bleeding that was cauterized and Endo clipped. She denies any melena, abdominal pain, nausea, vomiting or change in bowel habits. She is taking oral iron tablets twice daily. Her hemoglobin has stayed stable, with most recent hemoglobin on 12/04/2016 was 12.4, within normal range. Patient is worried about progressive weight loss though her diet hasn't changed. She has good appetite and does eat 3 meals a day. Denies any specific GI symptoms. She doesn't exercise on a regular basis and thinks she may be losing muscle mass.   Outpatient Encounter Prescriptions as of 01/01/2017  Medication Sig  . ALPRAZolam (XANAX) 0.5 MG tablet Take 1 tablet (0.5 mg total) by mouth daily as needed for anxiety.  [name] Kitchen amLODipine (NORVASC) 10 MG tablet Take 1 tablet (10 mg total) by mouth daily.  [name] Kitchen aspirin EC 81 MG tablet Take 1 tablet (81 mg total) by mouth daily.  [name] Kitchen atorvastatin (LIPITOR) 80 MG tablet TAKE 1 TABLET (80 MG TOTAL) BY MOUTH DAILY AT 6 PM.  . ferrous sulfate 325 (65 FE) MG tablet TAKE 1 TABLET BY MOUTH 3 TIMES A DAY WITH MEALS  . hydrALAZINE (APRESOLINE) 25 MG tablet Take 3 tablets (75 mg total) by mouth 3 (three) times daily. PT NEEDS APPT, CALL [phone] TO SCHEDULE, 1ST ATTEMPT  . levothyroxine (SYNTHROID, LEVOTHROID) 75 MCG tablet TAKE 1 TABLET BY MOUTH EVERY DAY  . pantoprazole (PROTONIX) 40 MG tablet TAKE 1 TABLET BY MOUTH TWICE A DAY  . PROAIR HFA 108 (90 Base) MCG/ACT inhaler INHALE 2 PUFFS INTO THE  LUNGS EVERY 6 (SIX) HOURS AS NEEDED FOR WHEEZING OR SHORTNESS OF BREATH.  . umeclidinium-vilanterol (ANORO ELLIPTA) 62.5-25 MCG/INH AEPB Inhale 1 puff into the lungs daily.  . pantoprazole (PROTONIX) 20 MG tablet Take 1 tablet (20 mg total) by mouth daily.  . [DISCONTINUED] atorvastatin (LIPITOR) 80 MG tablet Take 80 mg by mouth daily.  . [DISCONTINUED] cephALEXin (KEFLEX) 500 MG capsule Take 1 capsule (500 mg total) by mouth 3 (three) times daily. (Patient not taking: Reported on 01/01/2017)   No facility-administered encounter medications on file as of 01/01/2017.     Allergies as of 01/01/2017 - Review Complete 01/01/2017  Allergen Reaction Noted  . Catapres [clonidine hcl] Other (See Comments) 10/27/2015  . Lisinopril Anaphylaxis 06/22/2014  . Labetalol hcl Other (See Comments) 10/04/2015    Past Medical History:  Diagnosis Date  . Anxiety   . Aortic arch atherosclerosis ([place]) 06/24/2014  . Atherosclerotic ulcer of aorta ([place]) 06/24/2014  . Carotid artery occlusion   . [place] DISEASE, LUMBAR 12/16/2008  . DIVERTICULOSIS, COLON 09/30/2008  . DYSPNEA 07/13/2008  . Graves disease   . History of embolic stroke 1/91/4782   Left brain  . HYPERLIPIDEMIA 03/06/2007  . HYPERTENSION 03/06/2007  . HYPOTHYROIDISM 10/13/2007  . OSTEOARTHRITIS 03/06/2007  . Personal history of colonic polyps 09/30/2008  . Stroke ([place])    06/2014           . TIA (transient ischemic attack)   . TOBACCO  USE, QUIT 04/12/2009  . WEAKNESS 11/09/2007    Past Surgical History:  Procedure Laterality Date  . CARDIAC CATHETERIZATION    . CATARACT EXTRACTION Bilateral   . CHOLECYSTECTOMY    . ENDARTERECTOMY Left 11/13/2015   Procedure: LEFT CAROTID ARTERY ENDARTERECTOMY;  Surgeon: Conrad Plain, MD;  Location: [facility];  Service: Vascular;  Laterality: Left;  . ESOPHAGOGASTRODUODENOSCOPY (EGD) WITH PROPOFOL N/A 10/11/2016   Procedure: ESOPHAGOGASTRODUODENOSCOPY (EGD) WITH PROPOFOL;  Surgeon: Mauri Pole, MD;  Location: [facility]  ENDOSCOPY;  Service: Endoscopy;  Laterality: N/A;  . KNEE SURGERY    . PATCH ANGIOPLASTY Left 11/13/2015   Procedure: WITH 1CM X 6CM  XENOSURE BIOLOGIC PATCH ANGIOPLASTY;  Surgeon: Conrad [place], MD;  Location: [facility];  Service: Vascular;  Laterality: Left;  . TEE WITHOUT CARDIOVERSION N/A 06/24/2014   Procedure: TRANSESOPHAGEAL ECHOCARDIOGRAM (TEE);  Surgeon: Sanda Klein, MD;  [facility] ENDOSCOPY;  Service: Cardiovascular;  Laterality: N/A;    Family History  Problem Relation Age of Onset  . Stomach cancer Maternal Grandmother   . Colon cancer Neg Hx     Social History   Social History  . Marital status: Divorced    Spouse name: N/A  . Number of children: 4  . Years of education: college   Occupational History  . N/A    Social History Main Topics  . Smoking status: Former Smoker    Quit date: 07/08/1978  . Smokeless tobacco: Never Used  . Alcohol use Yes     Comment: "red wine"- some nights  . Drug use: No  . Sexual activity: Not on file   Other Topics Concern  . Not on file   Social History Narrative      Patient is right handed.   Patient drinks 1 cup caffeine daily.      Review of systems: Review of Systems  Constitutional: Negative for fever and chills.  HENT: Negative.   Eyes: Negative for blurred vision.  Respiratory: Negative for cough, shortness of breath and wheezing.   positive for COPD Cardiovascular: Negative for chest pain and palpitations.  Gastrointestinal: as per HPI Genitourinary: Negative for dysuria, urgency, frequency and hematuria.  Musculoskeletal: Negative for myalgias, back pain and joint pain.  Skin: Negative for itching and rash.  Neurological: Negative for dizziness, tremors, focal weakness, seizures and loss of consciousness.  Endo/Heme/Allergies: Positive for seasonal allergies.  Psychiatric/Behavioral: Negative for depression, suicidal ideas and hallucinations.  All other systems reviewed and are negative.   Physical  Exam: Vitals:   01/01/17 1003  BP: (!) 150/62  Pulse: 72   Body mass index is 21.11 kg/m. Gen:      No acute distress HEENT:  EOMI, sclera anicteric Neck:     No masses; no thyromegaly Lungs:    Clear to auscultation bilaterally; normal respiratory effort CV:         Regular rate and rhythm; no murmurs Abd:      + bowel sounds; soft, non-tender; no palpable masses, no distension Ext:    No edema; adequate peripheral perfusion Skin:      Warm and dry; no rash Neuro: alert and oriented x 3 Psych: normal mood and affect  Data Reviewed:  Reviewed labs, radiology imaging, old records and pertinent past GI work up   Assessment and Plan/Recommendations:  83 year old female with history of hypertension, bilateral carotid artery stenosis, CVA, anemia due to acute blood loss in April 2018 in the setting of bleeding AVM here for follow-up visit  Patient  has come off Plavix after her hospitalization with severe anemia secondary to bleeding AVM and she is currently on low-dose aspirin. She followed up with Dr. Bridgett Larsson and he agreed with holding of Plavix  Anemia: Hemoglobin improved to 12 Continue oral iron to build up iron stores and ferritin level Recheck CBC, iron panel and ferritin in 2 months  Weight loss: She has progressive weight loss of 10-15 pounds in the past 1-2 years Could be related to loss of bone and muscle weight Advise patient to start exercising on regular basis Discussed high protein diet Follow up in 3 months   15 minutes was spent face-to-face with the patient. Greater than 50% of the time used for counseling as well as treatment plan and follow-up. She had multiple questions which were answered to her satisfaction  K. Denzil Magnuson , MD [phone] Mon-Fri 8a-5p [phone] after 5p, weekends, holidays  CC: Marletta Lor, MD

## 2017-01-06 ENCOUNTER — Other Ambulatory Visit: Payer: Self-pay | Admitting: Physician Assistant

## 2017-01-06 DIAGNOSIS — R911 Solitary pulmonary nodule: Secondary | ICD-10-CM

## 2017-01-06 DIAGNOSIS — I779 Disorder of arteries and arterioles, unspecified: Secondary | ICD-10-CM

## 2017-01-06 DIAGNOSIS — Z8673 Personal history of transient ischemic attack (TIA), and cerebral infarction without residual deficits: Secondary | ICD-10-CM

## 2017-01-06 DIAGNOSIS — I739 Peripheral vascular disease, unspecified: Secondary | ICD-10-CM

## 2017-01-06 DIAGNOSIS — R0989 Other specified symptoms and signs involving the circulatory and respiratory systems: Secondary | ICD-10-CM

## 2017-01-06 DIAGNOSIS — F419 Anxiety disorder, unspecified: Secondary | ICD-10-CM

## 2017-01-17 NOTE — Anesthesia Postprocedure Evaluation (Signed)
Anesthesia Post Note  Patient: [name]  Procedure(s) Performed: Procedure(s) (LRB): ESOPHAGOGASTRODUODENOSCOPY (EGD) WITH PROPOFOL (N/A)     Anesthesia Post Evaluation  Last Vitals:  Vitals:   10/12/16 1430 10/12/16 1810  BP: (!) 135/48 (!) 149/57  Pulse: 73 81  Resp: 20 20  Temp: 36.8 C 36.9 C    Last Pain:  Vitals:   10/12/16 1810  TempSrc: Oral  PainSc:                  Riccardo Dubin

## 2017-01-17 NOTE — Addendum Note (Signed)
Addendum  created 01/17/17 1055 by Lyndle Herrlich, MD   Sign clinical note

## 2017-01-17 NOTE — Addendum Note (Signed)
Addendum  created 01/17/17 1056 by Lyndle Herrlich, MD   Sign clinical note

## 2017-01-24 ENCOUNTER — Other Ambulatory Visit: Payer: Self-pay | Admitting: Internal Medicine

## 2017-01-24 ENCOUNTER — Telehealth: Payer: Self-pay | Admitting: Cardiovascular Disease

## 2017-01-24 DIAGNOSIS — Z8673 Personal history of transient ischemic attack (TIA), and cerebral infarction without residual deficits: Secondary | ICD-10-CM

## 2017-01-24 DIAGNOSIS — R0989 Other specified symptoms and signs involving the circulatory and respiratory systems: Secondary | ICD-10-CM

## 2017-01-24 DIAGNOSIS — F419 Anxiety disorder, unspecified: Secondary | ICD-10-CM

## 2017-01-24 DIAGNOSIS — R911 Solitary pulmonary nodule: Secondary | ICD-10-CM

## 2017-01-24 DIAGNOSIS — I779 Disorder of arteries and arterioles, unspecified: Secondary | ICD-10-CM

## 2017-01-24 DIAGNOSIS — I739 Peripheral vascular disease, unspecified: Secondary | ICD-10-CM

## 2017-01-24 MED ORDER — AMLODIPINE BESYLATE 10 MG PO TABS
10.0000 mg | ORAL_TABLET | Freq: Every day | ORAL | 1 refills | Status: DC
Start: 2017-01-24 — End: 2017-02-19

## 2017-01-24 MED ORDER — HYDRALAZINE HCL 25 MG PO TABS: ORAL_TABLET | ORAL | 1 refills | Status: DC

## 2017-01-24 NOTE — Telephone Encounter (Signed)
New message        *STAT* If patient is at the pharmacy, call can be transferred to refill team.   1. Which medications need to be refilled? (please list name of each medication and dose if known) hydralazine 25mg . A,;pdo[ome 10mg  2. Which pharmacy/location (including street and city if local pharmacy) is medication to be sent to? [facility] [address] 3. Do they need a 30 day or 90 day supply? 30 day

## 2017-01-24 NOTE — Telephone Encounter (Signed)
Pt's Rx's were sent to pt's pharmacy as requested. Confirmation received.

## 2017-02-03 ENCOUNTER — Encounter: Payer: Self-pay | Admitting: Pulmonary Disease

## 2017-02-03 ENCOUNTER — Ambulatory Visit (INDEPENDENT_AMBULATORY_CARE_PROVIDER_SITE_OTHER): Payer: Medicare Other | Admitting: Pulmonary Disease

## 2017-02-03 VITALS — BP 140/78 | HR 91 | Ht 66.0 in | Wt 132.0 lb

## 2017-02-03 DIAGNOSIS — J438 Other emphysema: Secondary | ICD-10-CM | POA: Diagnosis not present

## 2017-02-03 NOTE — Patient Instructions (Signed)
Continue using your inhalers as prescribed. Return to clinic in 6 months. Please call us sooner if there is any change in his symptoms.

## 2017-02-03 NOTE — Progress Notes (Signed)
[name]    [id number]    01-15-1925  Primary Care Physician:Kwiatkowski, Doretha Sou, MD  Referring Physician: Marletta Lor, MD [place], [place] [address]  Chief complaint:  Follow up for  Moderate COPD Vocal cord paralysis  HPI: [name] is a 83 year old with complaints of dyspnea on exertion. The problem started in May 2017 after a carotid artery end arterectomy. This procedure was complicated by left vocal cord paralysis, hoarseness. She complains of daily cough with sputum production, wheezing. She denies any fevers, chills, hemoptysis. She had been tried on Advair for 2 months with some improvement in symptoms. She has difficulty clearing secretions. She has has a flutter valve and spirometer at home but is not using this on a regular basis.  She was hospitalized in the emergency room in October 2017 for COPD exacerbation. She was discharged on a prednisone taper and amoxicillin. Records from this hospitalization reviewed below in data section  Interim History: At last visit her inhalers was switched to Anoro. She feels is helping with her breathing. She hardly needs to use her rescue inhaler. She has occasional dyspnea on exertion and cough with white sputum. Denies any wheezing, fevers, chills.  Outpatient Encounter Prescriptions as of 02/03/2017  Medication Sig  . ALPRAZolam (XANAX) 0.5 MG tablet Take 1 tablet (0.5 mg total) by mouth daily as needed for anxiety.  [name] Kitchen amLODipine (NORVASC) 10 MG tablet Take 1 tablet (10 mg total) by mouth daily.  [name] Kitchen aspirin EC 81 MG tablet Take 1 tablet (81 mg total) by mouth daily.  [name] Kitchen atorvastatin (LIPITOR) 80 MG tablet TAKE 1 TABLET (80 MG TOTAL) BY MOUTH DAILY AT 6 PM.  . ferrous sulfate 325 (65 FE) MG tablet TAKE 1 TABLET BY MOUTH 3 TIMES A DAY WITH MEALS  . hydrALAZINE (APRESOLINE) 25 MG tablet TAKE 3 TABLETS BY MOUTH 3 TIMES A DAY  . levothyroxine (SYNTHROID, LEVOTHROID) 75 MCG tablet TAKE 1 TABLET  BY MOUTH EVERY DAY  . pantoprazole (PROTONIX) 20 MG tablet Take 1 tablet (20 mg total) by mouth daily.  . pantoprazole (PROTONIX) 40 MG tablet TAKE 1 TABLET BY MOUTH TWICE A DAY  . PROAIR HFA 108 (90 Base) MCG/ACT inhaler INHALE 2 PUFFS INTO THE LUNGS EVERY 6 (SIX) HOURS AS NEEDED FOR WHEEZING OR SHORTNESS OF BREATH.  . umeclidinium-vilanterol (ANORO ELLIPTA) 62.5-25 MCG/INH AEPB Inhale 1 puff into the lungs daily.   No facility-administered encounter medications on file as of 02/03/2017.     Allergies as of 02/03/2017 - Review Complete 02/03/2017  Allergen Reaction Noted  . Catapres [clonidine hcl] Other (See Comments) 10/27/2015  . Lisinopril Anaphylaxis 06/22/2014  . Labetalol hcl Other (See Comments) 10/04/2015    Past Medical History:  Diagnosis Date  . Anxiety   . Aortic arch atherosclerosis ([place]) 06/24/2014  . Atherosclerotic ulcer of aorta ([place]) 06/24/2014  . Carotid artery occlusion   . [place] DISEASE, LUMBAR 12/16/2008  . DIVERTICULOSIS, COLON 09/30/2008  . DYSPNEA 07/13/2008  . Graves disease   . History of embolic stroke 8/46/9629   Left brain  . HYPERLIPIDEMIA 03/06/2007  . HYPERTENSION 03/06/2007  . HYPOTHYROIDISM 10/13/2007  . OSTEOARTHRITIS 03/06/2007  . Personal history of colonic polyps 09/30/2008  . Stroke ([place])    06/2014           . TIA (transient ischemic attack)   . TOBACCO USE, QUIT 04/12/2009  . WEAKNESS 11/09/2007    Past Surgical History:  Procedure Laterality Date  . CARDIAC CATHETERIZATION    . CATARACT EXTRACTION Bilateral   . CHOLECYSTECTOMY    . ENDARTERECTOMY Left 11/13/2015   Procedure: LEFT CAROTID ARTERY ENDARTERECTOMY;  Surgeon: Conrad [place], MD;  Location: [facility];  Service: Vascular;  Laterality: Left;  . ESOPHAGOGASTRODUODENOSCOPY (EGD) WITH PROPOFOL N/A 10/11/2016   Procedure: ESOPHAGOGASTRODUODENOSCOPY (EGD) WITH PROPOFOL;  Surgeon: Mauri Pole, MD;  Location: [facility] ENDOSCOPY;  Service: Endoscopy;  Laterality: N/A;  . KNEE SURGERY    . PATCH  ANGIOPLASTY Left 11/13/2015   Procedure: WITH 1CM X 6CM  XENOSURE BIOLOGIC PATCH ANGIOPLASTY;  Surgeon: Conrad White, MD;  Location: [facility];  Service: Vascular;  Laterality: Left;  . TEE WITHOUT CARDIOVERSION N/A 06/24/2014   Procedure: TRANSESOPHAGEAL ECHOCARDIOGRAM (TEE);  Surgeon: Sanda Klein, MD;  Location: [facility] ENDOSCOPY;  Service: Cardiovascular;  Laterality: N/A;    Family History  Problem Relation Age of Onset  . Stomach cancer Maternal Grandmother   . Colon cancer Neg Hx     Social History   Social History  . Marital status: Divorced    Spouse name: N/A  . Number of children: 4  . Years of education: college   Occupational History  . N/A    Social History Main Topics  . Smoking status: Former Smoker    Quit date: 07/08/1978  . Smokeless tobacco: Never Used  . Alcohol use Yes     Comment: "red wine"- some nights  . Drug use: No  . Sexual activity: Not on file   Other Topics Concern  . Not on file   Social History Narrative      Patient is right handed.   Patient drinks 1 cup caffeine daily.     Review of systems: Review of Systems  Constitutional: Negative for fever and chills.  HENT: Negative.   Eyes: Negative for blurred vision.  Respiratory: as per HPI  Cardiovascular: Negative for chest pain and palpitations.  Gastrointestinal: Negative for vomiting, diarrhea, blood per rectum. Genitourinary: Negative for dysuria, urgency, frequency and hematuria.  Musculoskeletal: Negative for myalgias, back pain and joint pain.  Skin: Negative for itching and rash.  Neurological: Negative for dizziness, tremors, focal weakness, seizures and loss of consciousness.  Endo/Heme/Allergies: Negative for environmental allergies.  Psychiatric/Behavioral: Negative for depression, suicidal ideas and hallucinations.  All other systems reviewed and are negative.   Physical Exam: Blood pressure 140/78, pulse 91, height 5\' 6"  (1.676 m), weight 132 lb (59.9 kg), SpO2 95 %. Gen:       No acute distress HEENT:  EOMI, sclera anicteric Neck:     No masses; no thyromegaly Lungs:    Clear to auscultation bilaterally; normal respiratory effort CV:         Regular rate and rhythm; no murmurs Abd:      + bowel sounds; soft, non-tender; no palpable masses, no distension Ext:    No edema; adequate peripheral perfusion Skin:      Warm and dry; no rash Neuro: alert and oriented x 3 Psych: normal mood and affect  Data Reviewed: CXR 12/11/15- Nodular opacity in left mid lung, hyperexpanded lung, images reviewed  CT scan chest 11/26/12- Right lingular opacity, scattered sub cm pulmonary nodules, images reviewed.  CT 04/05/16- Stable pulmonary nodules Severe COPD. Images reviewed  PFTs 04/02/16 FVC 2.54 (107%) FEV1 1.43 (82%) F/F 56 TLC 107% DLCO 42% Mod obstructive lung disease with DLCO impairment.  DATA from [facility] [place] CT of neck 04/09/16- no CT  findings to suggest an acute soft tissue abnormality,  subtle asymmetric prominence of right sub-lingual tonsil. Asymmetric prominence of left vocal cord Multiple thyroid lesions Relative ectasia of left carotid bulb Moderate to advanced degenerative arthritis and disc disease  CT of the brain 04/09/16- no CT findings for acute intracranial abnormality. Minimal changes of aging.  Chest x-ray 04/09/16- no acute cardiac pulmonary disease. Emphysema.  EKG 04/09/16- sinus tachycardia, incomplete left bundle branch block, LVH, anterior Q waves  Assessment:  #1 Moderate COPD. Although she has moderate obstruction on PFTs her COPD is likely severe based on lung imaging and DLCO impairment. Symptoms are stable on Anoro inhaler.   I suspect she has difficulty clearing secretions due to the vocal cord paralysis. She has a flutter valve and incentive spirometer. I have encouraged her to use them at least 3 times daily.   #2 Lung nodule Stable on repeat CT from 2014 to 2017. Likely  benign.  Plan/Recommendations: - Continue anoro and albuterol PRN - Use incentive spirometer and flutter valve 3 times/ day.  Marshell Garfinkel MD [facility] Pager [phone] If no answer or after 3pm call: [phone] 02/03/2017, 10:01 AM  CC: Marletta Lor, MD

## 2017-02-18 ENCOUNTER — Encounter: Payer: Self-pay | Admitting: Internal Medicine

## 2017-02-18 ENCOUNTER — Ambulatory Visit (INDEPENDENT_AMBULATORY_CARE_PROVIDER_SITE_OTHER): Payer: Medicare Other | Admitting: Internal Medicine

## 2017-02-18 VITALS — BP 160/72 | HR 76 | Temp 97.6°F | Ht 66.0 in | Wt 133.0 lb

## 2017-02-18 DIAGNOSIS — I700 Atherosclerosis of aorta: Secondary | ICD-10-CM

## 2017-02-18 DIAGNOSIS — I10 Essential (primary) hypertension: Secondary | ICD-10-CM

## 2017-02-18 DIAGNOSIS — K31811 Angiodysplasia of stomach and duodenum with bleeding: Secondary | ICD-10-CM

## 2017-02-18 DIAGNOSIS — D649 Anemia, unspecified: Secondary | ICD-10-CM

## 2017-02-18 DIAGNOSIS — I6522 Occlusion and stenosis of left carotid artery: Secondary | ICD-10-CM | POA: Diagnosis not present

## 2017-02-18 LAB — CBC WITH DIFFERENTIAL/PLATELET
BASOS PCT: 1.3 % (ref 0.0–3.0)
Basophils Absolute: 0.1 10*3/uL (ref 0.0–0.1)
EOS ABS: 0.4 10*3/uL (ref 0.0–0.7)
Eosinophils Relative: 6.8 % — ABNORMAL HIGH (ref 0.0–5.0)
HCT: 40.9 % (ref 36.0–46.0)
HEMOGLOBIN: 13.2 g/dL (ref 12.0–15.0)
Lymphocytes Relative: 18.2 % (ref 12.0–46.0)
Lymphs Abs: 1 10*3/uL (ref 0.7–4.0)
MCHC: 32.2 g/dL (ref 30.0–36.0)
MCV: 94 fl (ref 78.0–100.0)
MONO ABS: 0.6 10*3/uL (ref 0.1–1.0)
Monocytes Relative: 11.1 % (ref 3.0–12.0)
Neutro Abs: 3.5 10*3/uL (ref 1.4–7.7)
Neutrophils Relative %: 62.6 % (ref 43.0–77.0)
Platelets: 213 10*3/uL (ref 150.0–400.0)
RBC: 4.35 Mil/uL (ref 3.87–5.11)
RDW: 13.9 % (ref 11.5–15.5)
WBC: 5.5 10*3/uL (ref 4.0–10.5)

## 2017-02-18 NOTE — Patient Instructions (Signed)
Limit your sodium (Salt) intake  Continue chronic iron therapy  Cardiology follow-up as scheduled  Pulmonary follow-up as scheduled  Return in 3 months for follow-up

## 2017-02-18 NOTE — Progress Notes (Signed)
Subjective:    Patient ID: [name], female    DOB: 07/13/1924, 83 y.o.   MRN: [id number]  HPI 83 year old patient who is seen today for follow-up. She has a history of essential hypertension.  She is scheduled to see cardiology in follow-up tomorrow.  She has a history of aortic arch atherosclerosis and is on statin therapy. 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  No recent CBC She has been seen by pulmonary recently with a history of COPD and pulmonary nodule She has bilateral carotid artery stenosis.  No focal neurological symptoms  Past Medical History:  Diagnosis Date  . Anxiety   . Aortic arch atherosclerosis ([place]) 06/24/2014  . Atherosclerotic ulcer of aorta ([place]) 06/24/2014  . Carotid artery occlusion   . [place] DISEASE, LUMBAR 12/16/2008  . DIVERTICULOSIS, COLON 09/30/2008  . DYSPNEA 07/13/2008  . Graves disease   . History of embolic stroke 11/10/3974   Left brain  . HYPERLIPIDEMIA 03/06/2007  . HYPERTENSION 03/06/2007  . HYPOTHYROIDISM 10/13/2007  . OSTEOARTHRITIS 03/06/2007  . Personal history of colonic polyps 09/30/2008  . Stroke ([place])    06/2014           . TIA (transient ischemic attack)   . TOBACCO USE, QUIT 04/12/2009  . WEAKNESS 11/09/2007     Social History   Social History  . Marital status: Divorced    Spouse name: N/A  . Number of children: 4  . Years of education: college   Occupational History  . N/A    Social History Main Topics  . Smoking status: Former Smoker    Quit date: 07/08/1978  . Smokeless tobacco: Never Used  . Alcohol use Yes     Comment: "red wine"- some nights  . Drug use: No  . Sexual activity: Not on file   Other Topics Concern  . Not on file   Social History Narrative      Patient is right handed.   Patient drinks 1 cup caffeine daily.    Past Surgical History:  Procedure Laterality Date  . CARDIAC CATHETERIZATION    . CATARACT EXTRACTION Bilateral   . CHOLECYSTECTOMY    .  ENDARTERECTOMY Left 11/13/2015   Procedure: LEFT CAROTID ARTERY ENDARTERECTOMY;  Surgeon: Conrad [place], MD;  Location: [facility];  Service: Vascular;  Laterality: Left;  . ESOPHAGOGASTRODUODENOSCOPY (EGD) WITH PROPOFOL N/A 10/11/2016   Procedure: ESOPHAGOGASTRODUODENOSCOPY (EGD) WITH PROPOFOL;  Surgeon: Mauri Pole, MD;  Location: [facility];  Service: Endoscopy;  Laterality: N/A;  . KNEE SURGERY    . PATCH ANGIOPLASTY Left 11/13/2015   Procedure: WITH 1CM X 6CM  XENOSURE BIOLOGIC PATCH ANGIOPLASTY;  Surgeon: Conrad [place], MD;  Location: [facility];  Service: Vascular;  Laterality: Left;  . TEE WITHOUT CARDIOVERSION N/A 06/24/2014   Procedure: TRANSESOPHAGEAL ECHOCARDIOGRAM (TEE);  Surgeon: Sanda Klein, MD;  Location: [facility] ENDOSCOPY;  Service: Cardiovascular;  Laterality: N/A;    Family History  Problem Relation Age of Onset  . Stomach cancer Maternal Grandmother   . Colon cancer Neg Hx     Allergies  Allergen Reactions  . Catapres [Clonidine Hcl] Other (See Comments)    Made pt feel horrible, shaky, weak and nausea  . Lisinopril Anaphylaxis    Tongue swelling  . Labetalol Hcl Other (See Comments)    Caused bradycardia and syncope    Current Outpatient Prescriptions on File Prior to Visit  Medication Sig Dispense Refill  . ALPRAZolam (  XANAX) 0.5 MG tablet Take 1 tablet (0.5 mg total) by mouth daily as needed for anxiety. 30 tablet 1  . amLODipine (NORVASC) 10 MG tablet Take 1 tablet (10 mg total) by mouth daily. 30 tablet 1  . aspirin EC 81 MG tablet Take 1 tablet (81 mg total) by mouth daily. 30 tablet 0  . atorvastatin (LIPITOR) 80 MG tablet TAKE 1 TABLET (80 MG TOTAL) BY MOUTH DAILY AT 6 PM. 90 tablet 2  . ferrous sulfate 325 (65 FE) MG tablet TAKE 1 TABLET BY MOUTH 3 TIMES A DAY WITH MEALS 90 tablet 3  . hydrALAZINE (APRESOLINE) 25 MG tablet TAKE 3 TABLETS BY MOUTH 3 TIMES A DAY 90 tablet 1  . levothyroxine (SYNTHROID, LEVOTHROID) 75 MCG tablet TAKE 1 TABLET BY MOUTH EVERY DAY 90  tablet 1  . pantoprazole (PROTONIX) 20 MG tablet Take 1 tablet (20 mg total) by mouth daily. 30 tablet 3  . PROAIR HFA 108 (90 Base) MCG/ACT inhaler INHALE 2 PUFFS INTO THE LUNGS EVERY 6 (SIX) HOURS AS NEEDED FOR WHEEZING OR SHORTNESS OF BREATH. 8.5 Inhaler 5  . umeclidinium-vilanterol (ANORO ELLIPTA) 62.5-25 MCG/INH AEPB Inhale 1 puff into the lungs daily. 60 each 5   No current facility-administered medications on file prior to visit.     BP (!) 160/72 (BP Location: Left Arm, Patient Position: Sitting, Cuff Size: Normal)   Pulse 76   Temp 97.6 F (36.4 C) (Oral)   Ht 5\' 6"  (1.676 m)   Wt 133 lb (60.3 kg)   SpO2 96%   BMI 21.47 kg/m      Review of Systems  Constitutional: Negative.   HENT: Negative for congestion, dental problem, hearing loss, rhinorrhea, sinus pressure, sore throat and tinnitus.   Eyes: Negative for pain, discharge and visual disturbance.  Respiratory: Negative for cough and shortness of breath.   Cardiovascular: Positive for leg swelling. Negative for chest pain and palpitations.  Gastrointestinal: Negative for abdominal distention, abdominal pain, blood in stool, constipation, diarrhea, nausea and vomiting.  Genitourinary: Negative for difficulty urinating, dysuria, flank pain, frequency, hematuria, pelvic pain, urgency, vaginal bleeding, vaginal discharge and vaginal pain.  Musculoskeletal: Negative for arthralgias, gait problem and joint swelling.  Skin: Negative for rash.  Neurological: Negative for dizziness, syncope, speech difficulty, weakness, numbness and headaches.  Hematological: Negative for adenopathy.  Psychiatric/Behavioral: Negative for agitation, behavioral problems and dysphoric mood. The patient is not nervous/anxious.        Objective:   Physical Exam  Constitutional: She is oriented to person, place, and time. She appears well-developed and well-nourished.  Blood pressure 160/60  HENT:  Head: Normocephalic.  Right Ear: External ear  normal.  Left Ear: External ear normal.  Mouth/Throat: Oropharynx is clear and moist.  Eyes: Pupils are equal, round, and reactive to light. Conjunctivae and EOM are normal.  Neck: Normal range of motion. Neck supple. No thyromegaly present.  Cardiovascular: Normal rate, regular rhythm and intact distal pulses.   Murmur heard. Grade 0-9/4 systolic murmur  Pulmonary/Chest: Effort normal. She has rales.  Bibasilar rales  Abdominal: Soft. Bowel sounds are normal. She exhibits no mass. There is no tenderness.  Musculoskeletal: Normal range of motion.  Lymphadenopathy:    She has no cervical adenopathy.  Neurological: She is alert and oriented to person, place, and time.  Skin: Skin is warm and dry. No rash noted.  Psychiatric: She has a normal mood and affect. Her behavior is normal.  Assessment & Plan:   Essential hypertension.  Systolic reading a bit high but acceptable.  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  Check CBC Continue chronic iron therapy and PPI Carotid artery stenosis.  Continue statin therapy COPD History pulmonary nodule.  Follow-up imaging study per pulmonary  Follow-up 3 months  [id number],PETER Pilar Plate

## 2017-02-19 ENCOUNTER — Encounter: Payer: Self-pay | Admitting: Nurse Practitioner

## 2017-02-19 ENCOUNTER — Ambulatory Visit (INDEPENDENT_AMBULATORY_CARE_PROVIDER_SITE_OTHER): Payer: Medicare Other | Admitting: Nurse Practitioner

## 2017-02-19 VITALS — BP 172/70 | HR 88 | Ht 66.0 in | Wt 134.8 lb

## 2017-02-19 DIAGNOSIS — R911 Solitary pulmonary nodule: Secondary | ICD-10-CM

## 2017-02-19 DIAGNOSIS — R0989 Other specified symptoms and signs involving the circulatory and respiratory systems: Secondary | ICD-10-CM | POA: Diagnosis not present

## 2017-02-19 DIAGNOSIS — I779 Disorder of arteries and arterioles, unspecified: Secondary | ICD-10-CM | POA: Diagnosis not present

## 2017-02-19 DIAGNOSIS — E7800 Pure hypercholesterolemia, unspecified: Secondary | ICD-10-CM

## 2017-02-19 DIAGNOSIS — I10 Essential (primary) hypertension: Secondary | ICD-10-CM | POA: Diagnosis not present

## 2017-02-19 DIAGNOSIS — F419 Anxiety disorder, unspecified: Secondary | ICD-10-CM | POA: Diagnosis not present

## 2017-02-19 DIAGNOSIS — I739 Peripheral vascular disease, unspecified: Secondary | ICD-10-CM

## 2017-02-19 DIAGNOSIS — Z8673 Personal history of transient ischemic attack (TIA), and cerebral infarction without residual deficits: Secondary | ICD-10-CM | POA: Diagnosis not present

## 2017-02-19 MED ORDER — AMLODIPINE BESYLATE 10 MG PO TABS: 10.0000 mg | ORAL_TABLET | Freq: Every day | ORAL | 3 refills | Status: DC

## 2017-02-19 MED ORDER — HYDRALAZINE HCL 25 MG PO TABS: ORAL_TABLET | ORAL | 3 refills | Status: DC

## 2017-02-19 NOTE — Progress Notes (Signed)
CARDIOLOGY OFFICE NOTE  Date:  02/19/2017    [name] Date of Birth: 1925-06-10 Medical Record #[id number]  PCP:  Marletta Lor, MD  Cardiologist:  Johnsie Cancel  Chief Complaint  Patient presents with  . Hypertension  . Hyperlipidemia  . Medication Refill    Follow up visit - seen for Dr. Johnsie Cancel    History of Present Illness: [name] is a 83 y.o. female who presents today for a follow up visit. Seen for Dr. Johnsie Cancel.   She has a history of labile HTN, HLD, hypothyroidism, CVA (left parietal in Dec. 2015), carotid artery diease w/high-grade left ICA stenosis with plaque ulceration, and pulmonary nodule.    She is followed by vascular surgery (Dr. Bridgett Larsson) and was scheduled for cerebral angiogram. Doppler evaluation suggested a long LICA stenosis >30%.  She became concerned about the risk of stroke and asked to have the procedure postponed until after cardiac evaluation. She was seen by Dr. Johnsie Cancel on 10/03/15 for pre operative clearance for possible LCEA.She was started on labetalol and her Norvasc was increased for better blood pressure control.  She took her first does of labetalol the following morning and shortly after began to feel weak, flushed and became very hot and had an episode of syncope. EMS was called and she was admitted 3/29-3/30/17 for work up. Her symptoms gradually improved. She was felt to be very sensitive to the labetalol with hypotension and bradycardia and now it is listed as an allergy. She had a Myoview during her admission on 10/03/15 which was low risk without scar or ischemia, EF normal. She was cleared for vascular surgery. It was felt that she should proceed with surgery as soon as possible. Diuretic therapy was discontinued due to mild renal insufficiency. She was discharged on amlodipine only for BP control.   Noted multiple past phone notes revealing that she was having issues with BPs and trialed on hydralazine and clonidine. She did not  tolerate the clonidine and this was discontinued after an ER visit.  She saw Elberta Leatherwood Pharm D in the blood pressure clinic on 10/20/15. It was noted that her blood pressure issues started after she was diagnosed with carotid artery disease requiring surgery. This has caused her quite a bit of anxiety. A 24 hour BP monitor was placed which showed mildly elevated BPs when awake and normal BPs at night. Avg BP was 143/64, awake BP 149/67 and asleep BP 127/57.Dr. Johnsie Cancel and Gay Filler felt that her elevated BPs were related to anxiety.   Plan was for L CEA on May of 2017 - looks like this complicated by a vocal cord paralysis and "with possible TE from extensive aortic arch atherosclerosis".  She was reluctant to start anticoagulation.  She elected to take ASA and Plavix instead.    Admitted back in April - profound anemia in the setting of aspirin and Plavix - transfused - had EGD and noted large AVM that was clipped. She is to stay just on aspirin despite being at risk for embolism from the aortic arch atherosclerosis.   She has not been seen here since April of 2017 - felt to be doing ok. Very anxious. Cardiac status felt to be ok.   Comes in today. Here with her daughter. Quite anxious. She remains very fixated on the visit and subsequent effects after being placed on Labetolol. Her ankles swell during the day. Will go down overnight. Does not sound like she gets too much salt -  but does like some Gatorade. Still playing bridge. She does yoga. Still going to the beach. Admits to getting "so revved up that she will have to take a Xanax". She has not chest pain. Says her breathing is ok. She feels like she is doing ok. Saw her PCP yesterday - BP was about 160 - she was pretty "worked up" at that visit due to some forms that she did not wish to fill out.   Past Medical History:  Diagnosis Date  . Anxiety   . Aortic arch atherosclerosis ([place]) 06/24/2014  . Atherosclerotic ulcer of aorta ([place]) 06/24/2014    . Carotid artery occlusion   . [place] DISEASE, LUMBAR 12/16/2008  . DIVERTICULOSIS, COLON 09/30/2008  . DYSPNEA 07/13/2008  . Graves disease   . History of embolic stroke 0/03/3817   Left brain  . HYPERLIPIDEMIA 03/06/2007  . HYPERTENSION 03/06/2007  . HYPOTHYROIDISM 10/13/2007  . OSTEOARTHRITIS 03/06/2007  . Personal history of colonic polyps 09/30/2008  . Stroke ([place])    06/2014           . TIA (transient ischemic attack)   . TOBACCO USE, QUIT 04/12/2009  . WEAKNESS 11/09/2007    Past Surgical History:  Procedure Laterality Date  . CARDIAC CATHETERIZATION    . CATARACT EXTRACTION Bilateral   . CHOLECYSTECTOMY    . ENDARTERECTOMY Left 11/13/2015   Procedure: LEFT CAROTID ARTERY ENDARTERECTOMY;  Surgeon: Conrad Uvalde, MD;  Location: [facility];  Service: Vascular;  Laterality: Left;  . ESOPHAGOGASTRODUODENOSCOPY (EGD) WITH PROPOFOL N/A 10/11/2016   Procedure: ESOPHAGOGASTRODUODENOSCOPY (EGD) WITH PROPOFOL;  Surgeon: Mauri Pole, MD;  Location: [facility];  Service: Endoscopy;  Laterality: N/A;  . KNEE SURGERY    . PATCH ANGIOPLASTY Left 11/13/2015   Procedure: WITH 1CM X 6CM  XENOSURE BIOLOGIC PATCH ANGIOPLASTY;  Surgeon: Conrad Riverton, MD;  Location: [facility];  Service: Vascular;  Laterality: Left;  . TEE WITHOUT CARDIOVERSION N/A 06/24/2014   Procedure: TRANSESOPHAGEAL ECHOCARDIOGRAM (TEE);  Surgeon: Sanda Klein, MD;  Location: [facility] ENDOSCOPY;  Service: Cardiovascular;  Laterality: N/A;     Medications: Current Meds  Medication Sig  . ALPRAZolam (XANAX) 0.5 MG tablet Take 1 tablet (0.5 mg total) by mouth daily as needed for anxiety.  [name] Kitchen amLODipine (NORVASC) 10 MG tablet Take 1 tablet (10 mg total) by mouth daily.  [name] Kitchen aspirin EC 81 MG tablet Take 1 tablet (81 mg total) by mouth daily.  [name] Kitchen atorvastatin (LIPITOR) 80 MG tablet TAKE 1 TABLET (80 MG TOTAL) BY MOUTH DAILY AT 6 PM.  . ferrous sulfate 325 (65 FE) MG tablet TAKE 1 TABLET BY MOUTH 3 TIMES A DAY WITH MEALS  . hydrALAZINE (APRESOLINE) 25  MG tablet TAKE 3 TABLETS BY MOUTH 3 TIMES A DAY  . levothyroxine (SYNTHROID, LEVOTHROID) 75 MCG tablet TAKE 1 TABLET BY MOUTH EVERY DAY  . pantoprazole (PROTONIX) 20 MG tablet Take 1 tablet (20 mg total) by mouth daily.  [name] Kitchen PROAIR HFA 108 (90 Base) MCG/ACT inhaler INHALE 2 PUFFS INTO THE LUNGS EVERY 6 (SIX) HOURS AS NEEDED FOR WHEEZING OR SHORTNESS OF BREATH.  . umeclidinium-vilanterol (ANORO ELLIPTA) 62.5-25 MCG/INH AEPB Inhale 1 puff into the lungs daily.  . [DISCONTINUED] amLODipine (NORVASC) 10 MG tablet Take 1 tablet (10 mg total) by mouth daily.  . [DISCONTINUED] hydrALAZINE (APRESOLINE) 25 MG tablet TAKE 3 TABLETS BY MOUTH 3 TIMES A DAY     Allergies: Allergies  Allergen Reactions  . Catapres [Clonidine Hcl] Other (See Comments)  Made pt feel horrible, shaky, weak and nausea  . Lisinopril Anaphylaxis    Tongue swelling  . Labetalol Hcl Other (See Comments)    Caused bradycardia and syncope    Social History: The patient  reports that she quit smoking about 38 years ago. She has never used smokeless tobacco. She reports that she drinks alcohol. She reports that she does not use drugs.   Family History: The patient's family history includes Stomach cancer in her maternal grandmother.   Review of Systems: Please see the history of present illness.   Otherwise, the review of systems is positive for none.   All other systems are reviewed and negative.   Physical Exam: VS:  BP (!) 172/70 (BP Location: Left Arm, Patient Position: Sitting, Cuff Size: Normal)   Pulse 88   Ht 5\' 6"  (1.676 m)   Wt 134 lb 12.8 oz (61.1 kg)   SpO2 96% Comment: at rest  BMI 21.76 kg/m  .  BMI Body mass index is 21.76 kg/m.  Wt Readings from Last 3 Encounters:  02/19/17 134 lb 12.8 oz (61.1 kg)  02/18/17 133 lb (60.3 kg)  02/03/17 132 lb (59.9 kg)   BP recheck by me is 150/60  General: Quite anxious.  Elderly female - looks younger than her stated age. She is alert and in no acute distress.     HEENT: Normal.  Neck: Supple, no JVD, carotid bruits, or masses noted.  Cardiac: Regular rate and rhythm. Soft outflow murmur. Trace edema.  Respiratory:  Lungs are clear to auscultation bilaterally with normal work of breathing.  GI: Soft and nontender.  MS: No deformity or atrophy. Gait and ROM intact.  Skin: Warm and dry. Color is normal.  Neuro:  Strength and sensation are intact and no gross focal deficits noted.  Psych: Alert, appropriate and with normal affect.   LABORATORY DATA:  EKG:  EKG is not ordered today.   Lab Results  Component Value Date   WBC 5.5 02/18/2017   HGB 13.2 02/18/2017   HCT 40.9 02/18/2017   PLT 213.0 02/18/2017   GLUCOSE 120 (H) 10/17/2016   CHOL 137 11/22/2015   TRIG 59 11/22/2015   HDL 59 11/22/2015   LDLDIRECT 141.7 05/01/2011   LDLCALC 66 11/22/2015   ALT 18 10/09/2016   AST 21 10/09/2016   NA 136 10/17/2016   K 3.9 10/17/2016   CL 102 10/17/2016   CREATININE 1.32 (H) 10/17/2016   BUN 18 10/17/2016   CO2 27 10/17/2016   TSH 2.69 10/09/2016   INR 1.12 11/21/2015   HGBA1C 6.0 (H) 11/22/2015     BNP (last 3 results) No results for input(s): BNP in the last 8760 hours.  ProBNP (last 3 results) No results for input(s): PROBNP in the last 8760 hours.   Other Studies Reviewed Today: Nm Myocar Multi W/spect W/wall Motion / Ef 10/05/2015   There was no ST segment deviation noted during stress.  Defect 1: There is a medium defect of mild severity present in the mid anteroseptal, mid inferoseptal and apical septal location.  This is a low risk study.  The study is normal.  The left ventricular ejection fraction is normal (55-65%).  Nuclear stress EF: 57%. Low risk stress nuclear study with LBBB-related perfusion artifact, otherwise normal perfusion and normal left ventricular regional and global systolic function.   CTA Neck (10/09/15) Advanced atherosclerotic plaque of the thoracic aorta and distal aortic arch.  50% diameter  stenosis proximal right internal carotid artery  Irregular plaque of the distal left common carotid artery with plaque ulceration. Very irregular plaque involving the proximal left internal carotid artery. Critical 85% stenosis of the proximal left internal carotid artery  Mild atherosclerotic disease in the right vertebral artery without significant vertebral artery stenosis.  Heterogeneous thyroid bilaterally with 11 mm right lower pole nodule. Recommend thyroid ultrasound.    Assessment/Plan:  1. Labile HTN: recheck by me is ok. She is pretty worked up here today and anxious. I am leaving her on her current regimen. Her medicines are refilled today.   2. Carotid artery diease w/high-grade left ICA stenosis with plaque ulceration: s/p CEA last year. This was complicated by a left vocal cord paralysis. Noted severe atherosclerotic burden in the arch with concerns for possible embolism.    3. HLD: on statin therapy  4. COPD/Pulmonary nodule (noted to be likely benign): followed closely by PCP & pulmonary  5. Hypothyroidism: Continue Synthroid - defer refill to PCP  6. Previous CVA: Continue aspirin and statin.  7. Anxiety:   8. Swelling - would try some support stockings. It does go down overnight - no other symptoms.   Current medicines are reviewed with the patient today.  The patient does not have concerns regarding medicines other than what has been noted above.  The following changes have been made:  See above.  Labs/ tests ordered today include:   No orders of the defined types were placed in this encounter.    Disposition:   She would like to see me in about 6 months.   Patient is agreeable to this plan and will call if any problems develop in the interim.   SignedTruitt Merle, NP  02/19/2017 2:59 PM  [facility] [address] [place], [place]  [address] Phone: [phone] Fax: [phone]

## 2017-02-19 NOTE — Patient Instructions (Addendum)
We will be checking the following labs today - NONE   Medication Instructions:    Continue with your current medicines.   I sent in your refills today    Testing/Procedures To Be Arranged:  N/A  Follow-Up:   See me in 6 months    Other Special Instructions:   Think about getting some support stockings - up to the knees - wear especially on the days you play bridge.     If you need a refill on your cardiac medications before your next appointment, please call your pharmacy.   Call the [place] office at [phone] if you have any questions, problems or concerns.

## 2017-02-20 IMAGING — CT CT ANGIO NECK
1 of 10 series · 1 of 33 positions shown · IV contrast (Iohexol (Omnipaque 350))
Comparison: CTA neck 10/09/2015.  CT head 11/21/2015.

CLINICAL DATA: Slurred speech, expressive aphasia, facial droop,
unable to use RIGHT hand for 2 days. Recent carotid endarterectomy.

EXAM:
CT ANGIOGRAPHY HEAD AND NECK
TECHNIQUE: Multidetector CT imaging of the head and neck was performed using
the standard protocol during bolus administration of intravenous
contrast. Multiplanar CT image reconstructions and MIPs were
obtained to evaluate the vascular anatomy. Carotid stenosis
measurements (when applicable) are obtained utilizing NASCET
criteria, using the distal internal carotid diameter as the
denominator.
CONTRAST:  Isovue 370, 50 mL.

[Series 200: locator · axial · 0.49mm/px · 1 of 1 slices shown]
[im 1/1  soft-tissue]
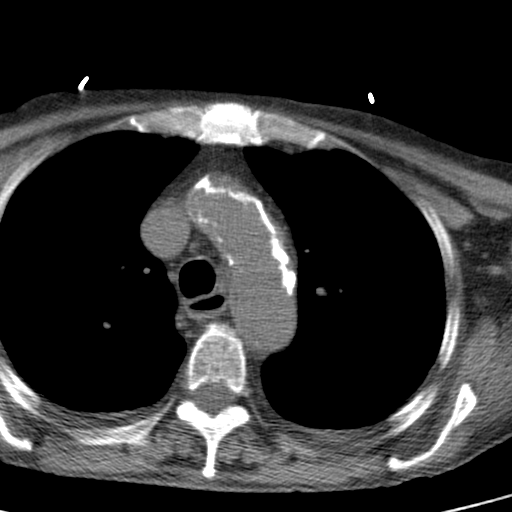

[1 of 33 positions shown; findings below may reference images not displayed]

FINDINGS: CT HEAD

Calvarium and skull base: No fracture or destructive lesion.
Mastoids and middle ears are grossly clear.

Paranasal sinuses: Imaged portions are clear.

Orbits: Negative.

Brain: No evidence of acute abnormality, including acute infarct,
hemorrhage, hydrocephalus, or mass lesion.

CTA NECK

Aortic arch: Standard branching. Imaged portion shows no evidence of
aneurysm or dissection. No significant stenosis of the major arch
vessel origins. Extensive atheromatous plaque in the aortic arch and
descending thoracic aorta resulting in luminal narrowing. No visible
dissection.

Right carotid system: 50% stenosis RIGHT ICA origin. Moderately
calcified plaque. No evidence of dissection, flow-limiting stenosis,
or occlusion.

Left carotid system: Widely patent status post endarterectomy. No
complicating features. No evidence of dissection, stenosis (50% or
greater) or occlusion.

Vertebral arteries: RIGHT dominant. Both patent. Calcific plaque at
both origins not flow reducing. No evidence of dissection, stenosis
(50% or greater) or occlusion.

Nonvascular soft tissues: COPD. Advanced facet arthropathy. Moderate
disc degeneration. Heterogeneous thyroid. RIGHT lower pole thyroid
nodule again noted measuring 11 mm

CTA HEAD

Anterior circulation: Dolichoectatic and asymmetrically enlarged
LEFT cervical ICA. Atherosclerotic type aneurysm involving the LEFT
cavernous and supraclinoid ICA measuring 10 x 10 x 13 mm. No
associated thrombus or luminal narrowing. Mild cavernous vascular
calcification on the RIGHT. No M1 stenosis. No MCA branch occlusion.

Posterior circulation: RIGHT vertebral dominant. Basilar artery
widely patent. Dolichoectasia RIGHT vertebral No significant
stenosis, proximal occlusion, aneurysm, or vascular malformation.

Venous sinuses: As permitted by contrast timing, patent.

Anatomic variants: None of significance.

Delayed phase:   No abnormal intracranial enhancement.
IMPRESSION: No visible complication related to recent LEFT carotid
endarterectomy. No flow reducing lesion is evident. Endarterectomy
is widely patent.

Atherosclerotic type aneurysmal enlargement in the cavernous and
supraclinoid ICA on the LEFT, 10 x 10 x 13 mm, without internal
thrombus or narrowing of the parent vessel.

No acute intracranial findings are evident. However, MRI is more
sensitive in the detection of acute infarction.

## 2017-02-20 IMAGING — CT CT HEAD W/O CM
3 of 4 series · 18 of 47 positions shown, 21 images · non-contrast
Comparison: 10/09/2015

CLINICAL DATA: Aphasia, history of recent left endarterectomy.

EXAM:
CT HEAD WITHOUT CONTRAST
TECHNIQUE: Contiguous axial images were obtained from the base of the skull
through the vertex without intravenous contrast.

[Series 201: head w/o, idose (1) · axial · non-contrast · 0.41mm/px · z∈[+699,+824]mm · 12 of 31 slices shown, 15 images]
[im 3/31  brain]
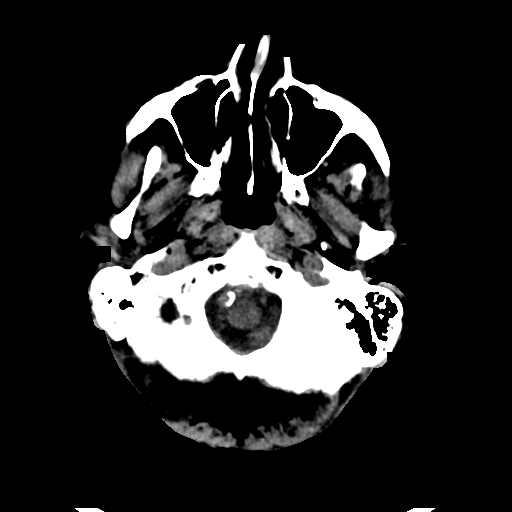
[im 3/31  bone]
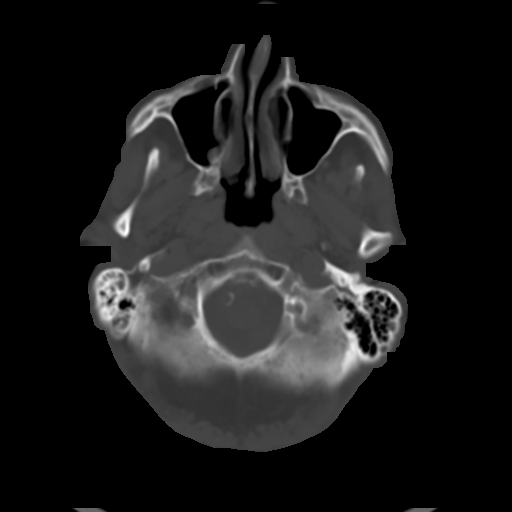
[im 5/31  brain]
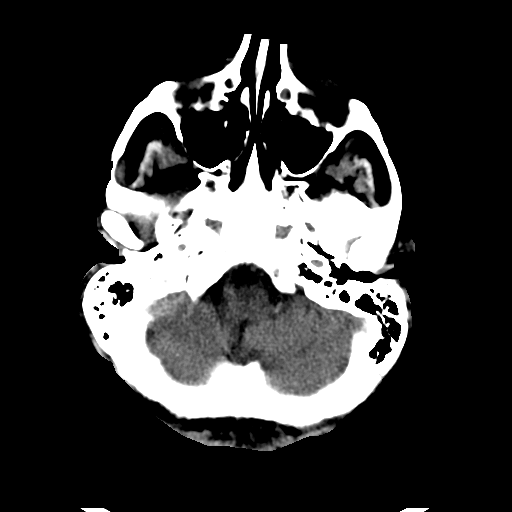
[im 7/31  brain]
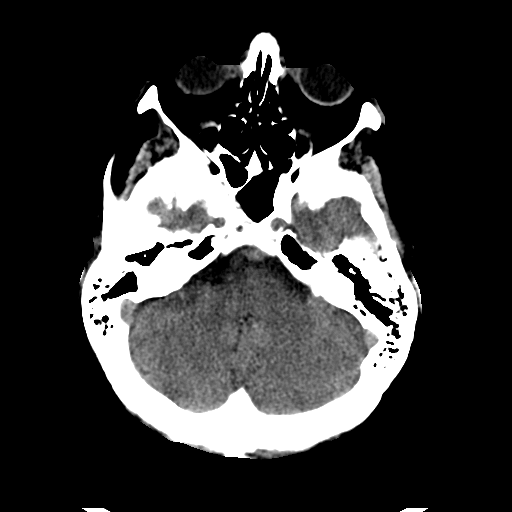
[im 9/31  brain]
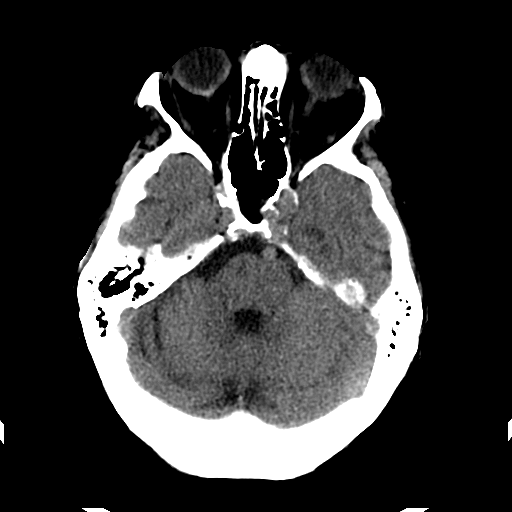
[im 11/31  brain]
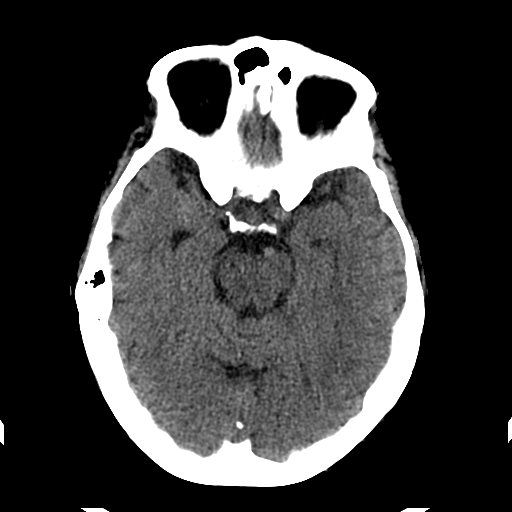
[im 11/31  bone]
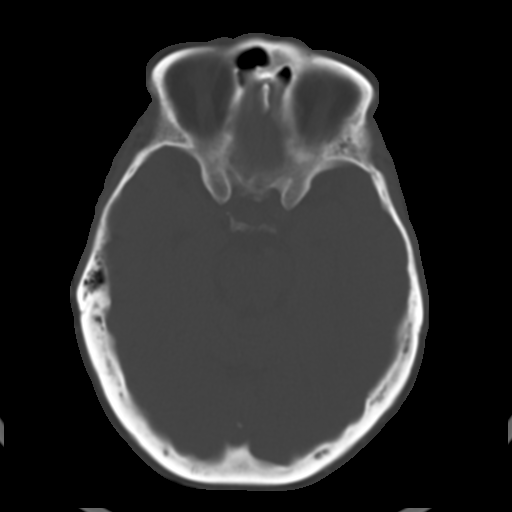
[im 13/31  brain]
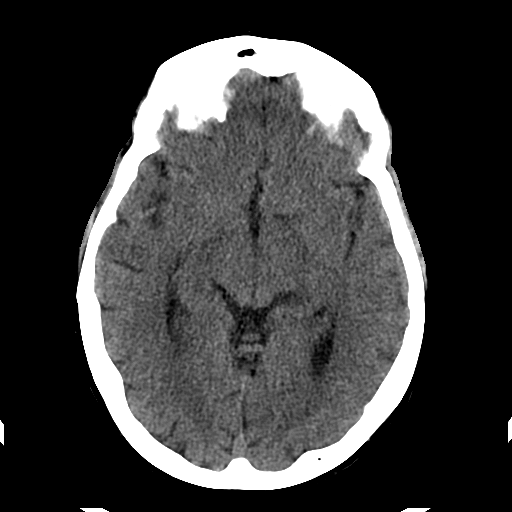
[im 18/31  brain]
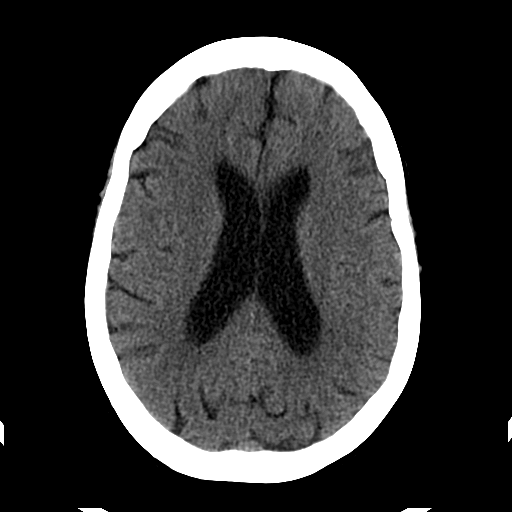
[im 20/31  brain]
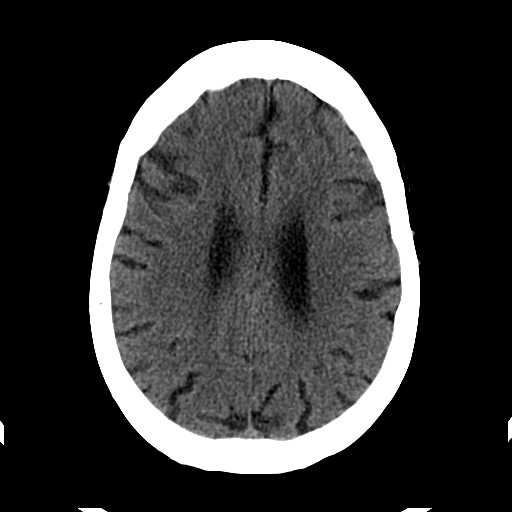
[im 22/31  brain]
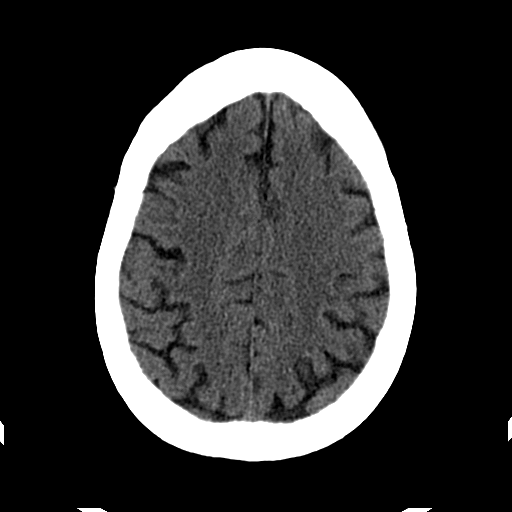
[im 22/31  bone]
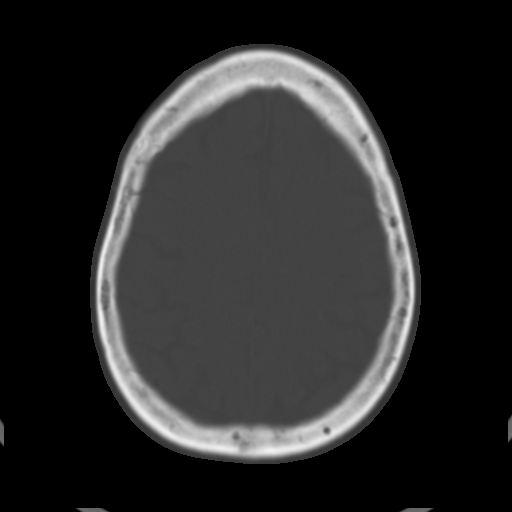
[im 24/31  brain]
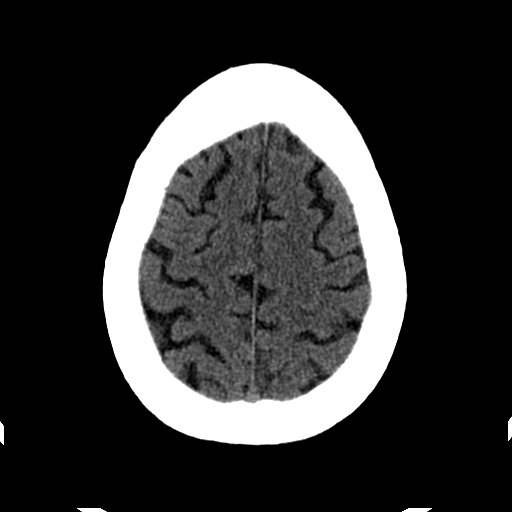
[im 26/31  brain]
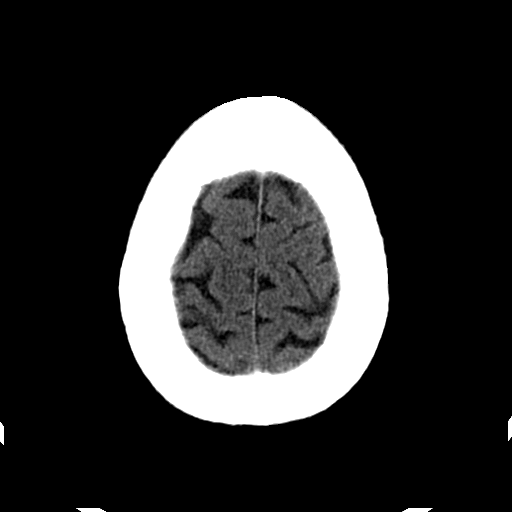
[im 28/31  brain]
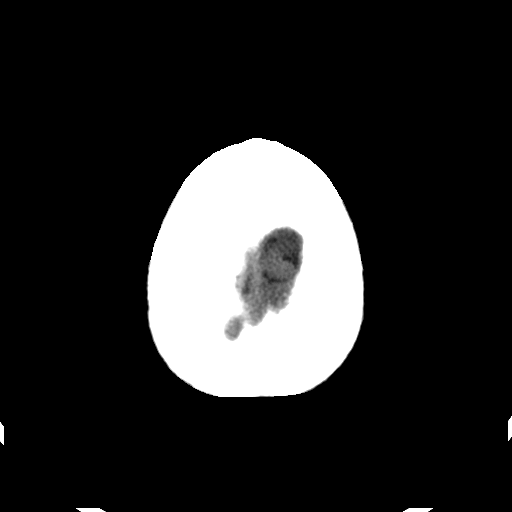

[Series 203: coronal st, idose (1) · coronal · 0.40mm/px · 3 of 59 slices shown]
[im 20/59  brain]
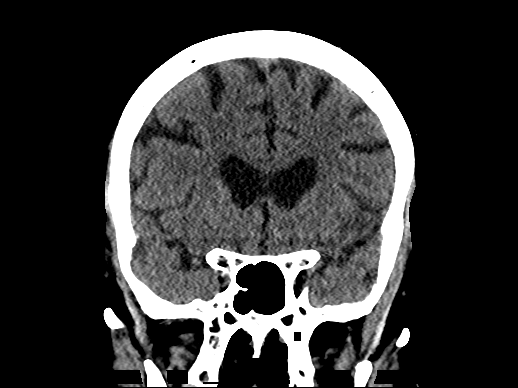
[im 26/59  brain]
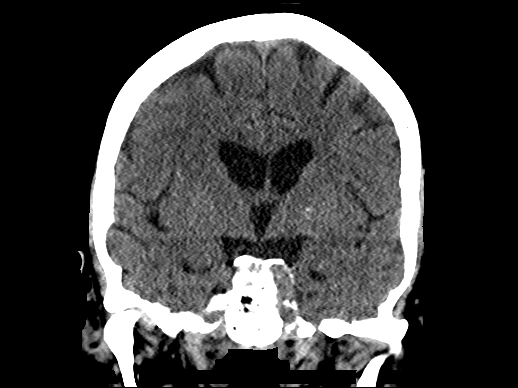
[im 33/59  brain]
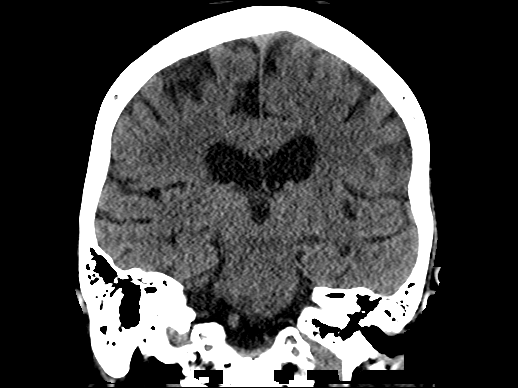

[Series 204: sagittal st, idose (1) · sagittal · 0.40mm/px · 3 of 46 slices shown]
[im 16/46  brain]
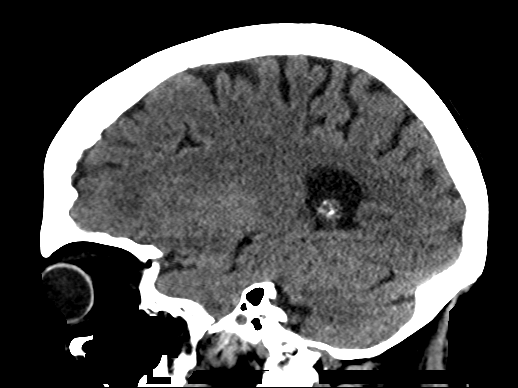
[im 23/46  brain]
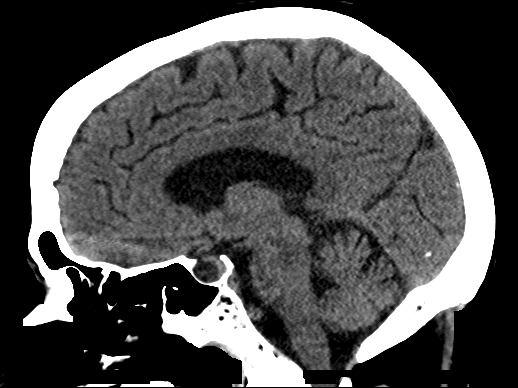
[im 31/46  brain]
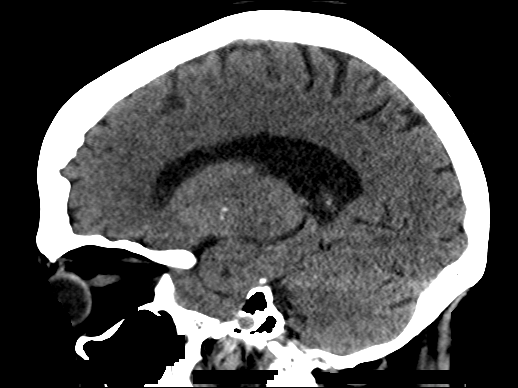

[18 of 47 positions shown; findings below may reference images not displayed]

FINDINGS: Bony calvarium is intact. No gross soft tissue abnormality is noted.
No findings to suggest acute hemorrhage, acute infarction or
space-occupying mass lesion are noted.
IMPRESSION: No acute abnormality noted.

## 2017-02-21 ENCOUNTER — Other Ambulatory Visit: Payer: Self-pay | Admitting: Physician Assistant

## 2017-02-28 ENCOUNTER — Other Ambulatory Visit: Payer: Self-pay | Admitting: Internal Medicine

## 2017-03-06 ENCOUNTER — Telehealth: Payer: Self-pay | Admitting: *Deleted

## 2017-03-06 NOTE — Telephone Encounter (Signed)
Follow-up CBC ?

## 2017-03-27 ENCOUNTER — Encounter: Payer: Self-pay | Admitting: Internal Medicine

## 2017-03-30 ENCOUNTER — Other Ambulatory Visit: Payer: Self-pay | Admitting: Pulmonary Disease

## 2017-03-31 ENCOUNTER — Telehealth: Payer: Self-pay | Admitting: Gastroenterology

## 2017-03-31 NOTE — Telephone Encounter (Signed)
Complains of random speels of diarrhea. It is not an everyday problem. Some days she feels she has a decreased urine output. Denies any black stools, indigestion or UTI type symptoms. She would like to hold the pantoprazole to see if her diarrhea stops. Please advise.

## 2017-03-31 NOTE — Telephone Encounter (Signed)
Ok to [employer] [facility] [employer] . Please advise patient to call back if continues to have persistent symptoms

## 2017-03-31 NOTE — Telephone Encounter (Signed)
Patient thanks for the call. She is in agreement with holding the protonix for now. She will call in 1 to 2 weeks if she is not better.

## 2017-04-26 ENCOUNTER — Other Ambulatory Visit: Payer: Self-pay | Admitting: Gastroenterology

## 2017-05-14 ENCOUNTER — Other Ambulatory Visit: Payer: Self-pay | Admitting: Internal Medicine

## 2017-05-21 ENCOUNTER — Ambulatory Visit: Payer: Medicare Other | Admitting: Internal Medicine

## 2017-06-19 ENCOUNTER — Other Ambulatory Visit: Payer: Self-pay | Admitting: Nurse Practitioner

## 2017-06-19 DIAGNOSIS — R911 Solitary pulmonary nodule: Secondary | ICD-10-CM

## 2017-06-19 DIAGNOSIS — I739 Peripheral vascular disease, unspecified: Secondary | ICD-10-CM

## 2017-06-19 DIAGNOSIS — Z8673 Personal history of transient ischemic attack (TIA), and cerebral infarction without residual deficits: Secondary | ICD-10-CM

## 2017-06-19 DIAGNOSIS — R0989 Other specified symptoms and signs involving the circulatory and respiratory systems: Secondary | ICD-10-CM

## 2017-06-19 DIAGNOSIS — F419 Anxiety disorder, unspecified: Secondary | ICD-10-CM

## 2017-06-19 DIAGNOSIS — I779 Disorder of arteries and arterioles, unspecified: Secondary | ICD-10-CM

## 2017-07-25 ENCOUNTER — Other Ambulatory Visit: Payer: Self-pay | Admitting: Internal Medicine

## 2017-07-25 DIAGNOSIS — Z1231 Encounter for screening mammogram for malignant neoplasm of breast: Secondary | ICD-10-CM

## 2017-07-29 ENCOUNTER — Ambulatory Visit: Payer: Medicare Other

## 2017-08-03 ENCOUNTER — Other Ambulatory Visit: Payer: Self-pay | Admitting: Internal Medicine

## 2017-08-05 ENCOUNTER — Ambulatory Visit (INDEPENDENT_AMBULATORY_CARE_PROVIDER_SITE_OTHER)
Admission: RE | Admit: 2017-08-05 | Discharge: 2017-08-05 | Disposition: A | Discharge status: Home / Self Care | Payer: Medicare Other | Source: Ambulatory Visit | Attending: Pulmonary Disease | Admitting: Pulmonary Disease

## 2017-08-05 ENCOUNTER — Ambulatory Visit: Payer: Medicare Other | Admitting: Pulmonary Disease

## 2017-08-05 ENCOUNTER — Encounter: Payer: Self-pay | Admitting: Pulmonary Disease

## 2017-08-05 VITALS — BP 162/70 | HR 87 | Ht 66.0 in | Wt 136.2 lb

## 2017-08-05 DIAGNOSIS — J449 Chronic obstructive pulmonary disease, unspecified: Secondary | ICD-10-CM

## 2017-08-05 DIAGNOSIS — R0600 Dyspnea, unspecified: Secondary | ICD-10-CM

## 2017-08-05 MED ORDER — BUDESONIDE-FORMOTEROL FUMARATE 160-4.5 MCG/ACT IN AERO: 2.0000 | INHALATION_SPRAY | Freq: Two times a day (BID) | RESPIRATORY_TRACT | 11 refills | Status: DC

## 2017-08-05 MED ORDER — BUDESONIDE-FORMOTEROL FUMARATE 160-4.5 MCG/ACT IN AERO: 2.0000 | INHALATION_SPRAY | Freq: Two times a day (BID) | RESPIRATORY_TRACT | 0 refills | Status: DC

## 2017-08-05 NOTE — Progress Notes (Signed)
[name]    [id number]    03/25/1925  Primary Care Physician:[name], Gloria Sou, MD  Referring Physician: Marletta Lor, MD [place], [name] [id number]  Chief complaint:  Follow up for  Moderate COPD Vocal cord paralysis  HPI: [name] is a 84 year old with complaints of dyspnea on exertion. The problem started in May 2017 after a carotid artery end arterectomy. This procedure was complicated by left vocal cord paralysis, hoarseness. She complains of daily cough with sputum production, wheezing. She denies any fevers, chills, hemoptysis. She had been tried on Advair for 2 months with some improvement in symptoms. She has difficulty clearing secretions. She has has a flutter valve and spirometer at home but is not using this on a regular basis.  She was hospitalized in the emergency room in October 2017 for COPD exacerbation. She was discharged on a prednisone taper and amoxicillin. Records from this hospitalization reviewed below in data section Hospitalized in April 2018 with severe anemia, bleeding AVMs.  Underwent EGD with cauterization, clipping of bleeding lesion.  Interim History: Continues in the anoro inhaler.  She is doing well on this and does not need to use her rescue inhaler.  No hospitalizations or ER visits.  Outpatient Encounter Medications as of 08/05/2017  Medication Sig  . amLODipine (NORVASC) 10 MG tablet TAKE 1 TABLET EVERY DAY  . ANORO ELLIPTA 62.5-25 MCG/INH AEPB INHALE 1 PUFF INTO THE LUNGS DAILY.  [name] Kitchen aspirin EC 81 MG tablet Take 1 tablet (81 mg total) by mouth daily.  [name] Kitchen atorvastatin (LIPITOR) 80 MG tablet TAKE 1 TABLET BY MOUTH EVERY DAY AT 6PM  . hydrALAZINE (APRESOLINE) 25 MG tablet TAKE 3 TABLETS BY MOUTH 3 TIMES A DAY  . levothyroxine (SYNTHROID, LEVOTHROID) 75 MCG tablet TAKE 1 TABLET BY MOUTH EVERY DAY  . PROAIR HFA 108 (90 Base) MCG/ACT inhaler INHALE 2 PUFFS INTO THE LUNGS EVERY 6 (SIX) HOURS AS NEEDED FOR  WHEEZING OR SHORTNESS OF BREATH.  . [DISCONTINUED] ALPRAZolam (XANAX) 0.5 MG tablet Take 1 tablet (0.5 mg total) by mouth daily as needed for anxiety.  . [DISCONTINUED] amLODipine (NORVASC) 10 MG tablet Take 1 tablet (10 mg total) by mouth daily.  . [DISCONTINUED] ferrous sulfate 325 (65 FE) MG tablet TAKE 1 TABLET BY MOUTH 3 TIMES A DAY WITH MEALS  . [DISCONTINUED] pantoprazole (PROTONIX) 20 MG tablet TAKE 1 TABLET BY MOUTH EVERY DAY  . [DISCONTINUED] budesonide-formoterol (SYMBICORT) 160-4.5 MCG/ACT inhaler Inhale 2 puffs into the lungs 2 (two) times daily.  . [DISCONTINUED] budesonide-formoterol (SYMBICORT) 160-4.5 MCG/ACT inhaler Inhale 2 puffs into the lungs 2 (two) times daily for 1 day.   No facility-administered encounter medications on file as of 08/05/2017.     Allergies as of 08/05/2017 - Review Complete 08/05/2017  Allergen Reaction Noted  . Catapres [clonidine hcl] Other (See Comments) 10/27/2015  . Lisinopril Anaphylaxis 06/22/2014  . Labetalol hcl Other (See Comments) 10/04/2015    Past Medical History:  Diagnosis Date  . Anxiety   . Aortic arch atherosclerosis ([name]) 06/24/2014  . Atherosclerotic ulcer of aorta ([name]) 06/24/2014  . Carotid artery occlusion   . [name] DISEASE, LUMBAR 12/16/2008  . DIVERTICULOSIS, COLON 09/30/2008  . DYSPNEA 07/13/2008  . Graves disease   . History of embolic stroke 0/86/7619   Left brain  . HYPERLIPIDEMIA 03/06/2007  . HYPERTENSION 03/06/2007  . HYPOTHYROIDISM 10/13/2007  . OSTEOARTHRITIS 03/06/2007  . Personal history of colonic polyps 09/30/2008  .  Stroke ([name])    06/2014           . TIA (transient ischemic attack)   . TOBACCO USE, QUIT 04/12/2009  . WEAKNESS 11/09/2007    Past Surgical History:  Procedure Laterality Date  . CARDIAC CATHETERIZATION    . CATARACT EXTRACTION Bilateral   . CHOLECYSTECTOMY    . ENDARTERECTOMY Left 11/13/2015   Procedure: LEFT CAROTID ARTERY ENDARTERECTOMY;  Surgeon: Gloria Tyler, MD;  Location: [facility];   Service: Vascular;  Laterality: Left;  . ESOPHAGOGASTRODUODENOSCOPY (EGD) WITH PROPOFOL N/A 10/11/2016   Procedure: ESOPHAGOGASTRODUODENOSCOPY (EGD) WITH PROPOFOL;  Surgeon: Gloria Pole, MD;  Location: [facility];  Service: Endoscopy;  Laterality: N/A;  . KNEE SURGERY    . PATCH ANGIOPLASTY Left 11/13/2015   Procedure: WITH 1CM X 6CM  XENOSURE BIOLOGIC PATCH ANGIOPLASTY;  Surgeon: Gloria Prescott, MD;  Location: [facility];  Service: Vascular;  Laterality: Left;  . TEE WITHOUT CARDIOVERSION N/A 06/24/2014   Procedure: TRANSESOPHAGEAL ECHOCARDIOGRAM (TEE);  Surgeon: Gloria Klein, MD;  [facility] END[facility]COPY;  Service: Cardiovascular;  Laterality: N/A;    Family History  Problem Relation Age of Onset  . Stomach cancer Maternal Grandmother   . Colon cancer Neg Hx     Social History   Socioeconomic History  . Marital status: Divorced    Spouse name: Not on file  . Number of children: 4  . Years of education: college  . Highest education level: Not on file  Social Needs  . Financial resource strain: Not on file  . Food insecurity - worry: Not on file  . Food insecurity - inability: Not on file  . Transportation needs - medical: Not on file  . Transportation needs - non-medical: Not on file  Occupational History  . Occupation: N/A  Tobacco Use  . Smoking status: Former Smoker    Last attempt to quit: 07/08/1978    Years since quitting: 39.1  . Smokeless tobacco: Never Used  Substance and Sexual Activity  . Alcohol use: Yes    Comment: "red wine"- some nights  . Drug use: No  . Sexual activity: Not on file  Other Topics Concern  . Not on file  Social History Narrative      Patient is right handed.   Patient drinks 1 cup caffeine daily.   Review of systems: Review of Systems  Constitutional: Negative for fever and chills.  HENT: Negative.   Eyes: Negative for blurred vision.  Respiratory: as per HPI  Cardiovascular: Negative for chest pain and palpitations.    Gastrointestinal: Negative for vomiting, diarrhea, blood per rectum. Genitourinary: Negative for dysuria, urgency, frequency and hematuria.  Musculoskeletal: Negative for myalgias, back pain and joint pain.  Skin: Negative for itching and rash.  Neurological: Negative for dizziness, tremors, focal weakness, seizures and loss of consciousness.  Endo/Heme/Allergies: Negative for environmental allergies.  Psychiatric/Behavioral: Negative for depression, suicidal ideas and hallucinations.  All other systems reviewed and are negative.  Physical Exam: Blood pressure (!) 162/70, pulse 87, height 5\' 6"  (1.676 m), weight 136 lb 3.2 oz (61.8 kg), SpO2 96 %. Gen:      No acute distress HEENT:  EOMI, sclera anicteric Neck:     No masses; no thyromegaly Lungs:    Clear to auscultation bilaterally; normal respiratory effort CV:         Regular rate and rhythm; no murmurs Abd:      + bowel sounds; soft, non-tender; no palpable masses, no distension Ext:  No edema; adequate peripheral perfusion Skin:      Warm and dry; no rash Neuro: alert and oriented x 3 Psych: normal mood and affect  Data Reviewed: CT scan chest 11/26/12- Right lingular opacity, scattered sub cm pulmonary nodules, images reviewed.  CT 04/05/16- Stable pulmonary nodules Severe COPD. Chest x-ray 10/09/16- hazy density in the left midlung.  Images reviewed  PFTs 04/02/16 FVC 2.54 (107%), FEV1 1.43 (82%), F/F 56, TLC 107%, DLCO 42% Mod obstructive lung disease with DLCO impairment.  No bronchodilator response  DATA from [employer], [place] CT of neck 04/09/16- no CT findings to suggest an acute soft tissue abnormality,  subtle asymmetric prominence of right sub-lingual tonsil. Asymmetric prominence of left vocal cord Multiple thyroid lesions Relative ectasia of left carotid bulb Moderate to advanced degenerative arthritis and disc disease  CT of the brain 04/09/16- no CT findings for acute intracranial  abnormality. Minimal changes of aging.  Chest x-ray 04/09/16- no acute cardiac pulmonary disease. Emphysema.  EKG 04/09/16- sinus tachycardia, incomplete left bundle branch block, LVH, anterior Q waves  Assessment:  #1 Moderate COPD. Although she has moderate obstruction on PFTs her COPD is likely severe based on lung imaging and DLCO impairment. Symptoms are stable on Anoro inhaler. Last chest x-ray in 2018 noted for hazy right lung opacity.  Get follow up chest x-ray today  I suspect she has difficulty clearing secretions due to the vocal cord paralysis.  Continue flutter valve and incentive spirometer.    #2 Lung nodule Stable on repeat CT from 2014 to 2017. Likely benign.  Plan/Recommendations: - Continue anoro and albuterol PRN - Use incentive spirometer and flutter valve 3 times/ day. - Chest x ray  Marshell Garfinkel MD [facility] Pager [phone] If no answer or after 3pm call: [phone] 08/05/2017, 3:01 PM  CC: Gloria Lor, MD

## 2017-08-05 NOTE — Patient Instructions (Signed)
We will get a chest x-ray today as a routine follow-up Continue the Anoro inhaler I will follow-up with you in 1 year.  Please call sooner if there is any change in his symptoms.

## 2017-08-13 ENCOUNTER — Other Ambulatory Visit: Payer: Self-pay | Admitting: Cardiovascular Disease

## 2017-08-13 ENCOUNTER — Encounter: Payer: Self-pay | Admitting: Nurse Practitioner

## 2017-08-13 DIAGNOSIS — I779 Disorder of arteries and arterioles, unspecified: Secondary | ICD-10-CM

## 2017-08-13 DIAGNOSIS — I739 Peripheral vascular disease, unspecified: Secondary | ICD-10-CM

## 2017-08-13 DIAGNOSIS — F419 Anxiety disorder, unspecified: Secondary | ICD-10-CM

## 2017-08-13 DIAGNOSIS — R911 Solitary pulmonary nodule: Secondary | ICD-10-CM

## 2017-08-13 DIAGNOSIS — Z8673 Personal history of transient ischemic attack (TIA), and cerebral infarction without residual deficits: Secondary | ICD-10-CM

## 2017-08-13 DIAGNOSIS — R0989 Other specified symptoms and signs involving the circulatory and respiratory systems: Secondary | ICD-10-CM

## 2017-08-19 ENCOUNTER — Other Ambulatory Visit: Payer: Self-pay | Admitting: Physician Assistant

## 2017-08-19 NOTE — Progress Notes (Signed)
CARDIOLOGY OFFICE NOTE  Date:  08/20/2017    [name] Date of Birth: Aug 04, 1924 Medical Record #[id number]  PCP:  Marletta Lor, MD  Cardiologist:  Gillian Shields  Chief Complaint  Patient presents with  . Hypertension    Follow up visit - seen for Dr. Johnsie Cancel    History of Present Illness: [name] is a 84 y.o. female who presents today for a 6 month check. Seen for Dr. Johnsie Cancel.   She has a history of labile HTN, HLD, hypothyroidism, CVA (left parietal in Dec. 2015), carotid artery diease w/high-grade left ICA stenosis with plaque ulceration, and pulmonary nodule.    She has been followed by vascular surgery (Dr. Bridgett Larsson) and was scheduled for cerebral angiogram. Doppler evaluation suggested a long LICA stenosis >29%. She became concerned about the risk of stroke and asked to have the procedure postponed until after cardiac evaluation. She was seen by Dr. Johnsie Cancel on 10/03/15 for pre operative clearance for possible LCEA.She was started on labetalol and her Norvasc was increased for better blood pressure control.  She took her first does of labetalol the following morning and shortly after began to feel weak, flushed and became very hot and had an episode of syncope. EMS was called and she was admitted 3/29-3/30/17 for work up. Her symptoms gradually improved. She was felt to be very sensitive to the labetalol with hypotension and bradycardia and now it is listed as an allergy. She had a Myoview during her admission on 10/03/15 which was low risk without scar or ischemia, EF normal. She was cleared for vascular surgery. It was felt that she should proceed with surgery as soon as possible. Diuretic therapy was discontinued due to mild renal insufficiency. She was discharged on amlodipine only for BP control.   Subsequent multiple phone notes revealing that she was having issues with BPs and trialed on hydralazine and clonidine. She did not tolerate the clonidine and  this was discontinued after an ER visit.  She saw Elberta Leatherwood Pharm D in the blood pressure clinic on 10/20/15. It was noted that her blood pressure issues started after she was diagnosed with carotid artery disease requiring surgery. This has caused her quite a bit of anxiety. A 24 hour BP monitor was placed which showed mildly elevated BPs when awake and normal BPs at night. Avg BP was 143/64, awake BP 149/67 and asleep BP 127/57.Dr. Johnsie Cancel and Gay Filler felt that her elevated BPs were related to anxiety.   Plan was for L CEA on May of 2017 - looks like this complicated by a vocal cord paralysis and "with possible TE from extensive aortic arch atherosclerosis". She was reluctant to start anticoagulation. She elected to take ASA and Plavix instead. But then was admitted with profound anemia in the setting of aspirin and Plavix - transfused - had EGD and noted large AVM that was clipped. She is to stay just on aspirin despite being at risk for embolism from the aortic arch atherosclerosis.   She was last seen back in August by me - very anxious - very fixated on past events. Was doing ok from our standpoint.   Comes in today. Here with her daughter. Seems to be doing ok. Says she is doing great. Says her BP is staying around 160 but also says not checking "because I get mad". May be a bit more short of breath but ok as long as she uses her inhaler she says she does fine.  Probably gets too much salt. Still with some swelling that will go down overnight. Going to the beach soon for a week of "bridge/fish and wine". Tolerating her medicines. No recent labs. She feels like overall she is doing well.   Past Medical History:  Diagnosis Date  . Anxiety   . Aortic arch atherosclerosis ([place]) 06/24/2014  . Atherosclerotic ulcer of aorta ([place]) 06/24/2014  . Carotid artery occlusion   . [place] DISEASE, LUMBAR 12/16/2008  . DIVERTICULOSIS, COLON 09/30/2008  . DYSPNEA 07/13/2008  . Graves disease   . History of  embolic stroke 1/60/1093   Left brain  . HYPERLIPIDEMIA 03/06/2007  . HYPERTENSION 03/06/2007  . HYPOTHYROIDISM 10/13/2007  . OSTEOARTHRITIS 03/06/2007  . Personal history of colonic polyps 09/30/2008  . Stroke ([place])    06/2014           . TIA (transient ischemic attack)   . TOBACCO USE, QUIT 04/12/2009  . WEAKNESS 11/09/2007    Past Surgical History:  Procedure Laterality Date  . CARDIAC CATHETERIZATION    . CATARACT EXTRACTION Bilateral   . CHOLECYSTECTOMY    . ENDARTERECTOMY Left 11/13/2015   Procedure: LEFT CAROTID ARTERY ENDARTERECTOMY;  Surgeon: Conrad Le[place], MD;  Location: [facility];  Service: Vascular;  Laterality: Left;  . ESOPHAGOGASTRODUODENOSCOPY (EGD) WITH PROPOFOL N/A 10/11/2016   Procedure: ESOPHAGOGASTRODUODENOSCOPY (EGD) WITH PROPOFOL;  Surgeon: Mauri Pole, MD;  Location: [facility];  Service: Endoscopy;  Laterality: N/A;  . KNEE SURGERY    . PATCH ANGIOPLASTY Left 11/13/2015   Procedure: WITH 1CM X 6CM  XENOSURE BIOLOGIC PATCH ANGIOPLASTY;  Surgeon: Conrad [place], MD;  Location: [facility];  Service: Vascular;  Laterality: Left;  . TEE WITHOUT CARDIOVERSION N/A 06/24/2014   Procedure: TRANSESOPHAGEAL ECHOCARDIOGRAM (TEE);  Surgeon: Sanda Klein, MD;  Location[facility] ENDOSCOPY;  Service: Cardiovascular;  Laterality: N/A;     Medications: Current Meds  Medication Sig  . amLODipine (NORVASC) 10 MG tablet TAKE 1 TABLET EVERY DAY  . ANORO ELLIPTA 62.5-25 MCG/INH AEPB INHALE 1 PUFF INTO THE LUNGS DAILY.  [name] Kitchen aspirin EC 81 MG tablet Take 1 tablet (81 mg total) by mouth daily.  [name] Kitchen atorvastatin (LIPITOR) 80 MG tablet TAKE 1 TABLET BY MOUTH EVERY DAY AT 6PM  . hydrALAZINE (APRESOLINE) 25 MG tablet TAKE 3 TABLETS BY MOUTH 3 TIMES A DAY  . levothyroxine (SYNTHROID, LEVOTHROID) 75 MCG tablet TAKE 1 TABLET BY MOUTH EVERY DAY  . PROAIR HFA 108 (90 Base) MCG/ACT inhaler INHALE 2 PUFFS INTO THE LUNGS EVERY 6 (SIX) HOURS AS NEEDED FOR WHEEZING OR SHORTNESS OF BREATH.      Allergies: Allergies  Allergen Reactions  . Catapres [Clonidine Hcl] Other (See Comments)    Made pt feel horrible, shaky, weak and nausea  . Lisinopril Anaphylaxis    Tongue swelling  . Labetalol Hcl Other (See Comments)    Caused bradycardia and syncope    Social History: The patient  reports that she quit smoking about 39 years ago. she has never used smokeless tobacco. She reports that she drinks alcohol. She reports that she does not use drugs.   Family History: The patient's family history includes Stomach cancer in her maternal grandmother.   Review of Systems: Please see the history of present illness.   Otherwise, the review of systems is positive for none.   All other systems are reviewed and negative.   Physical Exam: VS:  BP (!) 158/70 (BP Location: Left Arm, Patient Position: Sitting, Cuff Size: Normal)  Pulse 83   Ht 5\' 6"  (1.676 m)   Wt 134 lb 6.4 oz (61 kg)   SpO2 96% Comment: at rest  BMI 21.69 kg/m  .  BMI Body mass index is 21.69 kg/m.  Wt Readings from Last 3 Encounters:  08/20/17 134 lb 6.4 oz (61 kg)  08/05/17 136 lb 3.2 oz (61.8 kg)  02/19/17 134 lb 12.8 oz (61.1 kg)   BP is 150/70   General: Elderly female. Rather talkative. Alert and in no acute distress.   HEENT: Normal.  Neck: Supple, no JVD, carotid bruits, or masses noted.  Cardiac: Regular rate and rhythm. Outflow murmur. No edema.  Respiratory:  Lungs are clear to auscultation bilaterally with normal work of breathing.  GI: Soft and nontender.  MS: No deformity or atrophy. Gait and ROM intact.  Skin: Warm and dry. Color is normal.  Neuro:  Strength and sensation are intact and no gross focal deficits noted.  Psych: Alert, appropriate and with normal affect.   LABORATORY DATA:  EKG:  EKG is not ordered today.  Lab Results  Component Value Date   WBC 5.5 02/18/2017   HGB 13.2 02/18/2017   HCT 40.9 02/18/2017   PLT 213.0 02/18/2017   GLUCOSE 120 (H) 10/17/2016   CHOL 137  11/22/2015   TRIG 59 11/22/2015   HDL 59 11/22/2015   LDLDIRECT 141.7 05/01/2011   LDLCALC 66 11/22/2015   ALT 18 10/09/2016   AST 21 10/09/2016   NA 136 10/17/2016   K 3.9 10/17/2016   CL 102 10/17/2016   CREATININE 1.32 (H) 10/17/2016   BUN 18 10/17/2016   CO2 27 10/17/2016   TSH 2.69 10/09/2016   INR 1.12 11/21/2015   HGBA1C 6.0 (H) 11/22/2015     BNP (last 3 results) No results for input(s): BNP in the last 8760 hours.  ProBNP (last 3 results) No results for input(s): PROBNP in the last 8760 hours.   Other Studies Reviewed Today:  Nm Myocar Multi W/spect W/wall Motion / Ef 10/05/2015   There was no ST segment deviation noted during stress.  Defect 1: There is a medium defect of mild severity present in the mid anteroseptal, mid inferoseptal and apical septal location.  This is a low risk study.  The study is normal.  The left ventricular ejection fraction is normal (55-65%).  Nuclear stress EF: 57%. Low risk stress nuclear study with LBBB-related perfusion artifact, otherwise normal perfusion and normal left ventricular regional and global systolic function.   CTA Neck (10/09/15) Advanced atherosclerotic plaque of the thoracic aorta and distal aortic arch.  50% diameter stenosis proximal right internal carotid artery  Irregular plaque of the distal left common carotid artery with plaque ulceration. Very irregular plaque involving the proximal left internal carotid artery. Critical 85% stenosis of the proximal left internal carotid artery  Mild atherosclerotic disease in the right vertebral artery without significant vertebral artery stenosis.  Heterogeneous thyroid bilaterally with 11 mm right lower pole nodule. Recommend thyroid ultrasound.    Assessment/Plan:  1. Labile NAT:FTDDUKG by me is better - I have elected to leave her on her current regimen. She is not interested in further medicines or checking outside the clinic setting. I do  suspect she gets excessive salt. Overall, she seems to be holding her own and her anxieties have felt to play a significant role.   2. Carotid artery diease w/high-grade left ICA stenosis with plaque ulceration: s/p CEA last year 2542 which was complicated by a left  vocal cord paralysis. Noted severe atherosclerotic burden in the arch with concerns for possible embolism.  She is followed by [facility].   3. HLD: on statin therapy - lab today  4. COPD/Pulmonary nodule (noted to be likely benign): followed closely by PCP & pulmonary - some worsening DOE but notes her current regimen clearly helps.   5. Hypothyroidism:  6. Previous CVA: Continue aspirin and statin.  7. Anxiety: always an issue - I do not see this changing.   8. Swelling - most likely from salt and sitting with her legs down - it goes down overnight - no further recommendations other than salt restriction and elevate the legs for now.   Current medicines are reviewed with the patient today.  The patient does not have concerns regarding medicines other than what has been noted above.  The following changes have been made:  See above.  Labs/ tests ordered today include:    Orders Placed This Encounter  Procedures  . Basic metabolic panel  . CBC  . Hepatic function panel  . Lipid panel     Disposition:   FU with me or Dr. Johnsie Cancel in 6 months.    Patient is agreeable to this plan and will call if any problems develop in the interim.   SignedTruitt Merle, NP  08/20/2017 2:32 PM  [facility] [address] [place], [place]  [address] Phone: [phone] Fax: [phone]

## 2017-08-20 ENCOUNTER — Ambulatory Visit: Payer: Medicare Other | Admitting: Nurse Practitioner

## 2017-08-20 ENCOUNTER — Encounter: Payer: Self-pay | Admitting: Nurse Practitioner

## 2017-08-20 VITALS — BP 158/70 | HR 83 | Ht 66.0 in | Wt 134.4 lb

## 2017-08-20 DIAGNOSIS — I779 Disorder of arteries and arterioles, unspecified: Secondary | ICD-10-CM

## 2017-08-20 DIAGNOSIS — E7800 Pure hypercholesterolemia, unspecified: Secondary | ICD-10-CM

## 2017-08-20 DIAGNOSIS — I739 Peripheral vascular disease, unspecified: Secondary | ICD-10-CM

## 2017-08-20 DIAGNOSIS — R0989 Other specified symptoms and signs involving the circulatory and respiratory systems: Secondary | ICD-10-CM

## 2017-08-20 DIAGNOSIS — I10 Essential (primary) hypertension: Secondary | ICD-10-CM

## 2017-08-20 NOTE — Patient Instructions (Addendum)
We will be checking the following labs today - BMET, CBC, HPF and lipids   Medication Instructions:    Continue with your current medicines.     Testing/Procedures To Be Arranged:  N/A  Follow-Up:   See me in 6 months    Other Special Instructions:   N/A    If you need a refill on your cardiac medications before your next appointment, please call your pharmacy.   Call the [facility] office at [phone] if you have any questions, problems or concerns.      

## 2017-08-21 LAB — BASIC METABOLIC PANEL
BUN/Creatinine Ratio: 15 (ref 12–28)
BUN: 18 mg/dL (ref 10–36)
CO2: 22 mmol/L (ref 20–29)
Calcium: 9 mg/dL (ref 8.7–10.3)
Chloride: 102 mmol/L (ref 96–106)
Creatinine, Ser: 1.18 mg/dL — ABNORMAL HIGH (ref 0.57–1.00)
GFR calc Af Amer: 47 mL/min/{1.73_m2} — ABNORMAL LOW (ref >59)
GFR calc non Af Amer: 41 mL/min/{1.73_m2} — ABNORMAL LOW (ref >59)
Glucose: 113 mg/dL — ABNORMAL HIGH (ref 65–99)
Potassium: 4 mmol/L (ref 3.5–5.2)
Sodium: 140 mmol/L (ref 134–144)

## 2017-08-21 LAB — CBC
Hematocrit: 38.9 % (ref 34.0–46.6)
Hemoglobin: 13.3 g/dL (ref 11.1–15.9)
MCH: 31.4 pg (ref 26.6–33.0)
MCHC: 34.2 g/dL (ref 31.5–35.7)
MCV: 92 fL (ref 79–97)
Platelets: 213 10*3/uL (ref 150–379)
RBC: 4.24 x10E6/uL (ref 3.77–5.28)
RDW: 14.1 % (ref 12.3–15.4)
WBC: 5.4 10*3/uL (ref 3.4–10.8)

## 2017-08-21 LAB — LIPID PANEL
Chol/HDL Ratio: 1.9 ratio (ref 0.0–4.4)
Cholesterol, Total: 183 mg/dL (ref 100–199)
HDL: 94 mg/dL (ref >39)
LDL Calculated: 74 mg/dL (ref 0–99)
Triglycerides: 76 mg/dL (ref 0–149)
VLDL Cholesterol Cal: 15 mg/dL (ref 5–40)

## 2017-08-21 LAB — HEPATIC FUNCTION PANEL
ALT: 17 IU/L (ref 0–32)
AST: 21 IU/L (ref 0–40)
Albumin: 4.3 g/dL (ref 3.2–4.6)
Alkaline Phosphatase: 157 IU/L — ABNORMAL HIGH (ref 39–117)
Bilirubin Total: 0.5 mg/dL (ref 0.0–1.2)
Bilirubin, Direct: 0.14 mg/dL (ref 0.00–0.40)
Total Protein: 6.9 g/dL (ref 6.0–8.5)

## 2017-09-11 ENCOUNTER — Encounter: Payer: Self-pay | Admitting: Family Medicine

## 2017-09-11 ENCOUNTER — Ambulatory Visit: Payer: Medicare Other | Admitting: Family Medicine

## 2017-09-11 VITALS — BP 168/88 | HR 87 | Ht 66.0 in | Wt 135.0 lb

## 2017-09-11 DIAGNOSIS — M7918 Myalgia, other site: Secondary | ICD-10-CM

## 2017-09-11 MED ORDER — PREDNISONE 5 MG PO TABS: ORAL_TABLET | ORAL | 0 refills | Status: DC

## 2017-09-11 NOTE — Patient Instructions (Signed)
Please try the medication  Please try the exercise provided. Please try rubbing Aspercreme with lidocaine over the area. Please follow-up with me in 4-6 weeks if her symptoms have not improved.

## 2017-09-11 NOTE — Progress Notes (Signed)
[name] - 84 y.o. female MRN [id number]  Date of birth: 09-04-1924  SUBJECTIVE:  Including CC & ROS.  Chief Complaint  Patient presents with  . Left hip pain    [name] is a 84 y.o. female that is presenting with left hip pain. Pain started last night while she in her chair reading then when she stood up. Pain located at her left buttock. Pain is localized. Denies tingling and numbness. She took tramadol with improvement. Pain is mild to severe when she is standing.She does not use assistance when ambulating. Denies injury. Pain has some radiation qualities going down the lateral aspect of her leg to her knee.  Independently review of the left hip and pelvis x-ray from 2017 shows a clean lesion of the left femoral head. Having some hyper lucency of the joint itself. Mild to moderate arthritic changes of the left hip.  Review of Systems  Constitutional: Negative for fever.  HENT: Negative for congestion.   Eyes: Negative for pain.  Respiratory: Negative for cough.   Cardiovascular: Negative for chest pain.  Gastrointestinal: Negative for abdominal pain.  Endocrine: Negative for polyuria.  Musculoskeletal: Positive for back pain and gait problem.  Skin: Negative for color change.  Neurological: Negative for numbness.  Hematological: Negative for adenopathy.  Psychiatric/Behavioral: Negative for agitation.    HISTORY: Past Medical, Surgical, Social, and Family History Reviewed & Updated per EMR.   Pertinent Historical Findings include:  Past Medical History:  Diagnosis Date  . Anxiety   . Aortic arch atherosclerosis ([place]) 06/24/2014  . Atherosclerotic ulcer of aorta ([place]) 06/24/2014  . Carotid artery occlusion   . [place] DISEASE, LUMBAR 12/16/2008  . DIVERTICULOSIS, COLON 09/30/2008  . DYSPNEA 07/13/2008  . Graves disease   . History of embolic stroke 0/99/8338   Left brain  . HYPERLIPIDEMIA 03/06/2007  . HYPERTENSION 03/06/2007  . HYPOTHYROIDISM 10/13/2007  .  OSTEOARTHRITIS 03/06/2007  . Personal history of colonic polyps 09/30/2008  . Stroke ([place])    06/2014           . TIA (transient ischemic attack)   . TOBACCO USE, QUIT 04/12/2009  . WEAKNESS 11/09/2007    Past Surgical History:  Procedure Laterality Date  . CARDIAC CATHETERIZATION    . CATARACT EXTRACTION Bilateral   . CHOLECYSTECTOMY    . ENDARTERECTOMY Left 11/13/2015   Procedure: LEFT CAROTID ARTERY ENDARTERECTOMY;  Surgeon: Conrad [place], MD;  Location: [facility];  Service: Vascular;  Laterality: Left;  . ESOPHAGOGASTRODUODENOSCOPY (EGD) WITH PROPOFOL N/A 10/11/2016   Procedure: ESOPHAGOGASTRODUODENOSCOPY (EGD) WITH PROPOFOL;  Surgeon: Mauri Pole, MD;  Location: [facility];  Service: Endoscopy;  Laterality: N/A;  . KNEE SURGERY    . PATCH ANGIOPLASTY Left 11/13/2015   Procedure: WITH 1CM X 6CM  XENOSURE BIOLOGIC PATCH ANGIOPLASTY;  Surgeon: Conrad [place], MD;  Location: [facility];  Service: Vascular;  Laterality: Left;  . TEE WITHOUT CARDIOVERSION N/A 06/24/2014   Procedure: TRANSESOPHAGEAL ECHOCARDIOGRAM (TEE);  Surgeon: Sanda Klein, MD;  Location: [facility] ENDOSCOPY;  Service: Cardiovascular;  Laterality: N/A;    Allergies  Allergen Reactions  . Catapres [Clonidine Hcl] Other (See Comments)    Made pt feel horrible, shaky, weak and nausea  . Lisinopril Anaphylaxis    Tongue swelling  . Labetalol Hcl Other (See Comments)    Caused bradycardia and syncope    Family History  Problem Relation Age of Onset  . Stomach cancer Maternal Grandmother   . Colon cancer Neg Hx  Social History   Socioeconomic History  . Marital status: Divorced    Spouse name: Not on file  . Number of children: 4  . Years of education: college  . Highest education level: Not on file  Social Needs  . Financial resource strain: Not on file  . Food insecurity - worry: Not on file  . Food insecurity - inability: Not on file  . Transportation needs - medical: Not on file  . Transportation needs -  non-medical: Not on file  Occupational History  . Occupation: N/A  Tobacco Use  . Smoking status: Former Smoker    Last attempt to quit: 07/08/1978    Years since quitting: 39.2  . Smokeless tobacco: Never Used  Substance and Sexual Activity  . Alcohol use: Yes    Comment: "red wine"- some nights  . Drug use: No  . Sexual activity: Not on file  Other Topics Concern  . Not on file  Social History Narrative      Patient is right handed.   Patient drinks 1 cup caffeine daily.     PHYSICAL EXAM:  VS: BP (!) 168/88 (BP Location: Left Arm, Patient Position: Sitting, Cuff Size: Normal)   Pulse 87   Ht 5\' 6"  (1.676 m)   Wt 135 lb (61.2 kg)   SpO2 97%   BMI 21.79 kg/m  Physical Exam Gen: NAD, alert, cooperative with exam,  ENT: normal lips, normal nasal mucosa,  Eye: normal EOM, normal conjunctiva and lids CV:  no edema, +2 pedal pulses   Resp: no accessory muscle use, non-labored,  Skin: no rashes, no areas of induration  Neuro: normal tone, normal sensation to touch Psych:  normal insight, alert and oriented MSK:  Back:  TTP of the left lower back and buttock  No TTP of the GT  Some TTP of the piriformis  No pain with IR or ER  Normal strength to resistance with hip flexion  Negative SLR b/l  Normal knee flexion and extension  Ambulating with single point cane.  Neurovascularly intact     ASSESSMENT & PLAN:   Left buttock pain Has some components of sciatica but also could be more bursal irritation - Prednisone - Counseled on home exercise therapy - Counseled on supportive modalities - If no improvement consider an injection versus physical therapy.

## 2017-09-13 DIAGNOSIS — M48062 Spinal stenosis, lumbar region with neurogenic claudication: Secondary | ICD-10-CM | POA: Insufficient documentation

## 2017-09-13 DIAGNOSIS — M7918 Myalgia, other site: Secondary | ICD-10-CM | POA: Insufficient documentation

## 2017-09-13 NOTE — Assessment & Plan Note (Signed)
Has some components of sciatica but also could be more bursal irritation - Prednisone - Counseled on home exercise therapy - Counseled on supportive modalities - If no improvement consider an injection versus physical therapy.

## 2017-10-06 ENCOUNTER — Other Ambulatory Visit: Payer: Self-pay | Admitting: Pulmonary Disease

## 2017-10-08 ENCOUNTER — Other Ambulatory Visit: Payer: Self-pay

## 2017-10-08 DIAGNOSIS — I6523 Occlusion and stenosis of bilateral carotid arteries: Secondary | ICD-10-CM

## 2017-10-19 ENCOUNTER — Other Ambulatory Visit: Payer: Self-pay | Admitting: Internal Medicine

## 2017-10-20 NOTE — Progress Notes (Signed)
Established Carotid Patient   History of Present Illness   [name] is a 84 y.o. (Feb 23, 1925) female who presents with chief complaint: "no problems".  Prior procedures: L CEA (11/13/15) for L sx carotid stenosis >80%.  Previous carotid studies demonstrated: RICA 1-60% stenosis, LICA 7-37% stenosis.  Patient has history of TIA: right hand weakness and paresthesia.  Patient was also found to have L parietal stroke in Dec 2015, felt due to aortic arch embolization.    The patient has never had amaurosis fugax or monocular blindness.  The patient has never had facial drooping or hemiplegia.  The patient has never had receptive or expressive aphasia.  The patient's previous neurologic deficits have resolved.  The patient's PMH, PSH, SH, and FamHx were reviewed on 10/22/17 are unchanged from 10/18/16.  Current Outpatient Medications  Medication Sig Dispense Refill  . amLODipine (NORVASC) 10 MG tablet TAKE 1 TABLET EVERY DAY 90 tablet 3  . ANORO ELLIPTA 62.5-25 MCG/INH AEPB INHALE 1 PUFF INTO THE LUNGS EVERY DAY 60 each 5  . aspirin EC 81 MG tablet Take 1 tablet (81 mg total) by mouth daily. 30 tablet 0  . atorvastatin (LIPITOR) 80 MG tablet TAKE 1 TABLET BY MOUTH EVERY DAY AT 6PM 90 tablet 2  . hydrALAZINE (APRESOLINE) 25 MG tablet TAKE 3 TABLETS BY MOUTH 3 TIMES A DAY 270 tablet 2  . levothyroxine (SYNTHROID, LEVOTHROID) 75 MCG tablet TAKE 1 TABLET BY MOUTH EVERY DAY 90 tablet 1  . predniSONE (DELTASONE) 5 MG tablet Take 6 pills for first day, 5 pills second day, 4 pills third day, 3 pills fourth day, 2 pills the fifth day, and 1 pill sixth day. 21 tablet 0  . PROAIR HFA 108 (90 Base) MCG/ACT inhaler INHALE 2 PUFFS INTO THE LUNGS EVERY 6 (SIX) HOURS AS NEEDED FOR WHEEZING OR SHORTNESS OF BREATH. 8.5 Inhaler 5   No current facility-administered medications for this visit.     On ROS today: no CVA or TIA events, no cardiac sx   Physical Examination   Vitals:   10/22/17 0940  10/22/17 0946 10/22/17 0948  BP: (!) 162/83 (!) 161/80 (!) 156/74  Pulse: 63 63 63  Resp: 20    Temp: (!) 96.5 F (35.8 C)    TempSrc: Oral    SpO2: 95%    Weight: 135 lb (61.2 kg)    Height: 5\' 6"  (1.676 m)     Body mass index is 21.79 kg/m.  General Alert, O x 3, WD, NAD  Neck Supple, mid-line trachea,    Pulmonary Sym exp, good B air movt, CTA B  Cardiac RRR, Nl S1, S2, no Murmurs, No rubs, No S3,S4  Vascular Vessel Right Left  Radial Palpable Palpable  Brachial Palpable Palpable  Carotid Palpable, No Bruit Palpable, No Bruit  Aorta Not palpable N/A  Femoral Palpable Palpable  Popliteal Not palpable Not palpable  PT Faintly palpable Faintly palpable  DP Faintly palpable Faintly palpable    Gastro- intestinal soft, non-distended, non-tender to palpation, No guarding or rebound, no HSM, no masses, no CVAT B, No palpable prominent aortic pulse,    Musculo- skeletal M/S 5/5 throughout  , Extremities without ischemic changes  , L>R edema 1+  Neurologic Cranial nerves 2-12 intact , Pain and light touch intact in extremities , Motor exam as listed above    Non-Invasive Vascular Imaging   B Carotid Duplex (10/22/2017):   R ICA stenosis:  1-39%  R VA: patent and antegrade  L ICA stenosis:  patent CEA, mild hyperplasia (87/22 c/s)  L VA: patent and antegrade   Medical Decision Making   [name] is a 84 y.o. female who presents with: s/p L CEA for sx ICA stenosis >80%, asx R carotid stenosis 1-39%   Based on the patient's vascular studies and examination, I have offered the patient: annual B carotid duplex.  I discussed in depth with the patient the nature of atherosclerosis, and emphasized the importance of maximal medical management including strict control of blood pressure, blood glucose, and lipid levels, antiplatelet agents, obtaining regular exercise, and cessation of smoking.    The patient is aware that without maximal medical management the underlying  atherosclerotic disease process will progress, limiting the benefit of any interventions.  The patient is currently on a statin: Lipitor.   The patient is currently on an anti-platelet: ASA.  Thank you for allowing [place] to participate in this patient's care.   Adele Barthel, MD, FACS [facility] Office: [phone] Pager: [phone]

## 2017-10-22 ENCOUNTER — Ambulatory Visit: Payer: Medicare Other | Admitting: Vascular Surgery

## 2017-10-22 ENCOUNTER — Ambulatory Visit (HOSPITAL_COMMUNITY)
Admission: RE | Admit: 2017-10-22 | Discharge: 2017-10-22 | Disposition: A | Discharge status: Home / Self Care | Payer: Medicare Other | Source: Ambulatory Visit | Attending: Vascular Surgery | Admitting: Vascular Surgery

## 2017-10-22 ENCOUNTER — Encounter: Payer: Self-pay | Admitting: Vascular Surgery

## 2017-10-22 ENCOUNTER — Other Ambulatory Visit: Payer: Self-pay

## 2017-10-22 VITALS — BP 156/74 | HR 63 | Temp 96.5°F | Resp 20 | Ht 66.0 in | Wt 135.0 lb

## 2017-10-22 DIAGNOSIS — I6523 Occlusion and stenosis of bilateral carotid arteries: Secondary | ICD-10-CM

## 2017-10-22 DIAGNOSIS — Z9889 Other specified postprocedural states: Secondary | ICD-10-CM | POA: Diagnosis not present

## 2017-10-22 DIAGNOSIS — Z09 Encounter for follow-up examination after completed treatment for conditions other than malignant neoplasm: Secondary | ICD-10-CM | POA: Diagnosis present

## 2017-10-24 ENCOUNTER — Encounter (HOSPITAL_COMMUNITY): Payer: Medicare Other

## 2017-10-24 ENCOUNTER — Ambulatory Visit: Payer: Medicare Other | Admitting: Vascular Surgery

## 2017-11-12 ENCOUNTER — Emergency Department (HOSPITAL_COMMUNITY): Payer: Medicare Other

## 2017-11-12 ENCOUNTER — Emergency Department (HOSPITAL_COMMUNITY)
Admission: EM | Admit: 2017-11-12 | Discharge: 2017-11-13 | Disposition: A | Discharge status: Home / Self Care | Payer: Medicare Other | Attending: Physician Assistant | Admitting: Physician Assistant

## 2017-11-12 ENCOUNTER — Encounter (HOSPITAL_COMMUNITY): Payer: Self-pay

## 2017-11-12 ENCOUNTER — Other Ambulatory Visit: Payer: Self-pay

## 2017-11-12 DIAGNOSIS — Z87891 Personal history of nicotine dependence: Secondary | ICD-10-CM | POA: Insufficient documentation

## 2017-11-12 DIAGNOSIS — J441 Chronic obstructive pulmonary disease with (acute) exacerbation: Secondary | ICD-10-CM | POA: Diagnosis not present

## 2017-11-12 DIAGNOSIS — Z7982 Long term (current) use of aspirin: Secondary | ICD-10-CM | POA: Insufficient documentation

## 2017-11-12 DIAGNOSIS — Z79899 Other long term (current) drug therapy: Secondary | ICD-10-CM | POA: Diagnosis not present

## 2017-11-12 DIAGNOSIS — E039 Hypothyroidism, unspecified: Secondary | ICD-10-CM | POA: Diagnosis not present

## 2017-11-12 DIAGNOSIS — I10 Essential (primary) hypertension: Secondary | ICD-10-CM | POA: Insufficient documentation

## 2017-11-12 DIAGNOSIS — R0602 Shortness of breath: Secondary | ICD-10-CM | POA: Diagnosis present

## 2017-11-12 LAB — CBC
HCT: 38.1 % (ref 36.0–46.0)
Hemoglobin: 12.4 g/dL (ref 12.0–15.0)
MCH: 30.2 pg (ref 26.0–34.0)
MCHC: 32.5 g/dL (ref 30.0–36.0)
MCV: 92.7 fL (ref 78.0–100.0)
PLATELETS: 175 10*3/uL (ref 150–400)
RBC: 4.11 MIL/uL (ref 3.87–5.11)
RDW: 13.9 % (ref 11.5–15.5)
WBC: 6.6 10*3/uL (ref 4.0–10.5)

## 2017-11-12 LAB — I-STAT TROPONIN, ED
TROPONIN I, POC: 0.01 ng/mL (ref 0.00–0.08)
Troponin i, poc: 0.01 ng/mL (ref 0.00–0.08)

## 2017-11-12 LAB — BASIC METABOLIC PANEL
Anion gap: 10 (ref 5–15)
BUN: 18 mg/dL (ref 6–20)
CHLORIDE: 105 mmol/L (ref 101–111)
CO2: 23 mmol/L (ref 22–32)
CREATININE: 1.49 mg/dL — AB (ref 0.44–1.00)
Calcium: 8.8 mg/dL — ABNORMAL LOW (ref 8.9–10.3)
GFR calc non Af Amer: 30 mL/min — ABNORMAL LOW (ref >60)
GFR, EST AFRICAN AMERICAN: 34 mL/min — AB (ref >60)
Glucose, Bld: 178 mg/dL — ABNORMAL HIGH (ref 65–99)
Potassium: 3.1 mmol/L — ABNORMAL LOW (ref 3.5–5.1)
SODIUM: 138 mmol/L (ref 135–145)

## 2017-11-12 LAB — BRAIN NATRIURETIC PEPTIDE: B Natriuretic Peptide: 111.9 pg/mL — ABNORMAL HIGH (ref 0.0–100.0)

## 2017-11-12 LAB — D-DIMER, QUANTITATIVE (NOT AT ARMC): D DIMER QUANT: 1.93 ug{FEU}/mL — AB (ref 0.00–0.50)

## 2017-11-12 MED ORDER — POTASSIUM CHLORIDE CRYS ER 20 MEQ PO TBCR
40.0000 meq | EXTENDED_RELEASE_TABLET | Freq: Once | ORAL | Status: AC
  Administered 2017-11-12: 40 meq via ORAL
  Filled 2017-11-12: qty 2

## 2017-11-12 NOTE — Discharge Instructions (Addendum)
Please follow up with your regular doctor regarding today's ER visit. Your blood sugar was elevated in the ER today, please have this rechecked.   You can continue using your albuterol inhaler at home for wheezing, shortness of breath. I have provided prescriptions for a nebulizer machine  Please follow-up with your pulmonologist   Return to the Emergency Dept for any chest pain, difficulty breathing, fevers, abdominal pain, or any other worsening or  concerning symptoms. [name]

## 2017-11-12 NOTE — ED Provider Notes (Signed)
[facility] EMERGENCY DEPARTMENT Provider Note   CSN: [id number] Arrival date & time: 11/12/17  1702     History   Chief Complaint Chief Complaint  Patient presents with  . Shortness of Breath    HPI [name] is a 84 y.o. female.  HPI   [name] is a 84yo female with a history of COPD, hypertension, hyperlipidemia, CVA, hypothyroidism who presents emergency department via EMS for evaluation of shortness of breath.  Patient reports that she felt short of breath when walking in [employer] earlier today.  States that she tried taking albuterol inhaler without improvement.  Denies wheezing or coughing at the time.  States that when she got home she walked up the stairs and felt exceedingly short of breath, tried using inhaler again without relief.  She states that shortness of breath seems to be worse upon exertion and improved with sitting still.  She also reports several weeks of bilateral ankle swelling, particularly with being on her feet all day.  She denies fevers, chills, cough, congestion, wheezing, chest pain, nausea/vomiting, diaphoresis, lightheadedness, syncope, abdominal pain, dysuria, urinary frequency, numbness, weakness.  Denies history of DVT/PE, exogenous estrogen, personal history of cancer, unilateral leg swelling/calf pain, recent surgery or immobilization. She lives alone, is very active in the community. According to EMS report pt was wheezing upon EMS arrival, given 2 breathing treatments and 125 solumedrol en route.   Past Medical History:  Diagnosis Date  . Anxiety   . Aortic arch atherosclerosis ([place]) 06/24/2014  . Atherosclerotic ulcer of aorta ([place]) 06/24/2014  . Carotid artery occlusion   . [place] DISEASE, LUMBAR 12/16/2008  . DIVERTICULOSIS, COLON 09/30/2008  . DYSPNEA 07/13/2008  . Graves disease   . History of embolic stroke 10/14/8117   Left brain  . HYPERLIPIDEMIA 03/06/2007  . HYPERTENSION 03/06/2007  . HYPOTHYROIDISM 10/13/2007  .  OSTEOARTHRITIS 03/06/2007  . Personal history of colonic polyps 09/30/2008  . Stroke ([place])    06/2014           . TIA (transient ischemic attack)   . TOBACCO USE, QUIT 04/12/2009  . WEAKNESS 11/09/2007    Patient Active Problem List   Diagnosis Date Noted  . Left buttock pain 09/13/2017  . Symptomatic anemia   . AVM (arteriovenous malformation) of duodenum, acquired with hemorrhage   . Acute GI bleeding 10/09/2016  . Other emphysema ([place]) 05/10/2016  . Recurrent laryngeal neuropathy 02/01/2016  . Nausea with vomiting 12/11/2015  . Left carotid stenosis 11/21/2015  . Asymptomatic carotid artery stenosis 11/13/2015  . Impaired glucose tolerance 10/27/2015  . Solitary pulmonary nodule 10/27/2015  . Thyroid nodule 10/27/2015  . Syncope 10/04/2015  . Bradycardia with less than 60 beats per minute 10/04/2015  . Paresthesia 10/03/2014  . History of embolic stroke 14/78/2956  . Paresthesia of both feet 07/06/2014  . Aortic arch atherosclerosis ([place]) 06/24/2014  . Atherosclerotic ulcer of aorta ([place]) 06/24/2014  . Stroke ([place]) 06/22/2014  . CVA (cerebral infarction) 06/22/2014  . Bilateral carotid artery disease ([place]) 05/18/2014  . TOBACCO USE, QUIT 04/12/2009  . [place] DISEASE, LUMBAR 12/16/2008  . DIVERTICULOSIS, COLON 09/30/2008  . Personal history of colonic polyps 09/30/2008  . DYSPNEA 07/13/2008  . Weakness 11/09/2007  . Hypothyroidism 10/13/2007  . Hyperlipidemia 03/06/2007  . Essential hypertension 03/06/2007  . Osteoarthritis 03/06/2007    Past Surgical History:  Procedure Laterality Date  . CARDIAC CATHETERIZATION    . CATARACT EXTRACTION Bilateral   . CHOLECYSTECTOMY    .  ENDARTERECTOMY Left 11/13/2015   Procedure: LEFT CAROTID ARTERY ENDARTERECTOMY;  Surgeon: Conrad [place], MD;  Location: [facility];  Service: Vascular;  Laterality: Left;  . ESOPHAGOGASTRODUODENOSCOPY (EGD) WITH PROPOFOL N/A 10/11/2016   Procedure: ESOPHAGOGASTRODUODENOSCOPY (EGD) WITH PROPOFOL;  Surgeon:  Mauri Pole, MD;  [facility];  Service: Endoscopy;  Laterality: N/A;  . KNEE SURGERY    . PATCH ANGIOPLASTY Left 11/13/2015   Procedure: WITH 1CM X 6CM  XENOSURE BIOLOGIC PATCH ANGIOPLASTY;  Surgeon: Conrad [place], MD;  Location: [place] [facility];  Service: Vascular;  Laterality: Left;  . TEE WITHOUT CARDIOVERSION N/A 06/24/2014   Procedure: TRANSESOPHAGEAL ECHOCARDIOGRAM (TEE);  Surgeon: Sanda Klein, MD;  [facility] ENDOSCOPY;  Service: Cardiovascular;  Laterality: N/A;     OB History   None      Home Medications    Prior to Admission medications   Medication Sig Start Date End Date Taking? Authorizing Provider  amLODipine (NORVASC) 10 MG tablet TAKE 1 TABLET EVERY DAY 02/28/17   Marletta Lor, MD  ANORO ELLIPTA 62.5-25 MCG/INH AEPB INHALE 1 PUFF INTO THE LUNGS EVERY DAY 10/06/17   Marshell Garfinkel, MD  aspirin EC 81 MG tablet Take 1 tablet (81 mg total) by mouth daily. 11/23/15   Alvia Grove, PA-C  atorvastatin (LIPITOR) 80 MG tablet TAKE 1 TABLET BY MOUTH EVERY DAY AT 6PM 08/05/17   Marletta Lor, MD  hydrALAZINE (APRESOLINE) 25 MG tablet TAKE 3 TABLETS BY MOUTH 3 TIMES A DAY 08/13/17   Josue Hector, MD  levothyroxine (SYNTHROID, LEVOTHROID) 75 MCG tablet TAKE 1 TABLET BY MOUTH EVERY DAY 10/20/17   Marletta Lor, MD  predniSONE (DELTASONE) 5 MG tablet Take 6 pills for first day, 5 pills second day, 4 pills third day, 3 pills fourth day, 2 pills the fifth day, and 1 pill sixth day. Patient not taking: Reported on 10/22/2017 09/11/17   Rosemarie Ax, MD  PROAIR HFA 108 [id number] Base) MCG/ACT inhaler INHALE 2 PUFFS INTO THE LUNGS EVERY 6 (SIX) HOURS AS NEEDED FOR WHEEZING OR SHORTNESS OF BREATH. 04/02/16   Marletta Lor, MD    Family History Family History  Problem Relation Age of Onset  . Stomach cancer Maternal Grandmother   . Colon cancer Neg Hx     Social History Social History   Tobacco Use  . Smoking status: Former Smoker    Last attempt  to quit: 07/08/1978    Years since quitting: 39.3  . Smokeless tobacco: Never Used  Substance Use Topics  . Alcohol use: Yes    Comment: "red wine"- some nights  . Drug use: No     Allergies   Catapres [clonidine hcl]; Lisinopril; and Labetalol hcl   Review of Systems Review of Systems  Constitutional: Negative for chills, diaphoresis and fever.  HENT: Negative for congestion and sore throat.   Eyes: Negative for visual disturbance.  Respiratory: Positive for shortness of breath. Negative for cough, chest tightness and wheezing.   Cardiovascular: Positive for leg swelling (bilateral ankle swelling). Negative for chest pain.  Gastrointestinal: Negative for abdominal pain, nausea and vomiting.  Genitourinary: Negative for difficulty urinating, dysuria and frequency.  Musculoskeletal: Negative for gait problem.  Skin: Negative for rash.  Neurological: Negative for weakness, numbness and headaches.  Psychiatric/Behavioral: Negative for confusion.     Physical Exam Updated Vital Signs BP (!) 168/62   Pulse 81   Temp (!) 97.5 F (36.4 C) (Axillary)   Resp 18   Ht  5\' 6"  (1.676 m)   Wt 61.2 kg (135 lb)   SpO2 97%   BMI 21.79 kg/m   Physical Exam  Constitutional: She is oriented to person, place, and time. She appears well-developed and well-nourished. No distress.  HENT:  Head: Normocephalic and atraumatic.  Mouth/Throat: Oropharynx is clear and moist. No oropharyngeal exudate.  Eyes: Pupils are equal, round, and reactive to light. Conjunctivae are normal. Right eye exhibits no discharge. Left eye exhibits no discharge.  Neck: Normal range of motion. Neck supple. No JVD present. No tracheal deviation present.  Cardiovascular:  Murmur (Systolic grade 3/6) heard. Tachycardic, regular rate.   Pulmonary/Chest: Effort normal. No respiratory distress.  No respiratory distress, speaking in full sentences. Good air movement. Lungs CTA.  Abdominal: Soft. Bowel sounds are normal.  There is no tenderness. There is no guarding.  Musculoskeletal:  Mild bilateral ankle swelling. No erythema or tenderness. No pitting edema of the shin. DP pulses 1+ bilaterally.   Neurological: She is alert and oriented to person, place, and time. Coordination normal.  Skin: Skin is warm and dry. Capillary refill takes less than 2 seconds. She is not diaphoretic.  Psychiatric: She has a normal mood and affect. Her behavior is normal.  Nursing note and vitals reviewed.    ED Treatments / Results  Labs (all labs ordered are listed, but only abnormal results are displayed) Labs Reviewed  BRAIN NATRIURETIC PEPTIDE - Abnormal; Notable for the following components:      Result Value   B Natriuretic Peptide 111.9 (*)    All other components within normal limits  BASIC METABOLIC PANEL - Abnormal; Notable for the following components:   Potassium 3.1 (*)    Glucose, Bld 178 (*)    Creatinine, Ser 1.49 (*)    Calcium 8.8 (*)    GFR calc non Af Amer 30 (*)    GFR calc Af Amer 34 (*)    All other components within normal limits  D-DIMER, QUANTITATIVE (NOT AT [facility]) - Abnormal; Notable for the following components:   D-Dimer, Quant 1.93 (*)    All other components within normal limits  CBC  I-STAT TROPONIN, ED    EKG None  Radiology Dg Chest 2 View  Result Date: 11/12/2017 CLINICAL DATA:  84 year old female with shortness of breath. Initial encounter. EXAM: CHEST - 2 VIEW COMPARISON:  08/05/2017 and 12/11/2015 chest x-ray. 04/05/2016 chest CT. FINDINGS: Chronic lung changes (emphysema) including nodular density mid left lung stable since 2017. No infiltrate, congestive heart failure or pneumothorax. Heart size top-normal. Calcified slightly tortuous aorta. No acute osseous abnormality. IMPRESSION: Stable chronic lung changes.  Emphysema (ICD10-J43.9). No infiltrate or congestive heart failure. Aortic Atherosclerosis (ICD10-I70.0). Electronically Signed   By: Genia Del M.D.   On:  11/12/2017 19:15    Procedures Procedures (including critical care time)  Medications Ordered in ED Medications - No data to display   Initial Impression / Assessment and Plan / ED Course  I have reviewed the triage vital signs and the nursing notes.  Pertinent labs & imaging results that were available during my care of the patient were reviewed by me and considered in my medical decision making (see chart for details).     Patient with a history of COPD presents to the ED for evaluation of SOB with exertion which began today. She denies fever, chills, cough, wheezing, chest pain. Reports some vague bilateral ankle swelling. No history of CHF.   On exam she is afebrile and non-toxic appearing.  No respiratory distress. O2 sat 97% on RA. Lungs CTA, no wheezing or crackles. Mild swelling in bilateral ankles, no pitting edema in the legs.   CXR without acute abnormality. CBC WNL. BMP reveals mildly elevated Creatinine (1.49, baseline 1.2.) Her potassium mildly low at 3.1, replaced with po potassium. Glucose elevated at 178, she will need to have this rechecked by her pcp. Given lungs CTA, patient tachycardic and also reporting SOB will get ddimer to further evaluatie. Ddimer positive (1.93.) Will get V/Q scan given GFR 30. Initial troponin negative and EKG without acute changes.   Patient ambulated in the ED with O2 sat >95%. Awaiting V/Q scan and second troponin. Sign out given at shift change to oncoming PA Charlann Lange. If V/Q scan negative for PE and delta troponin negative, patient can be discharged home with instructions to follow up with her PCP in regards to today's ED visit. Discussed this patient with Dr. Vanita Panda who also saw the patient and agrees with plan.   Final Clinical Impressions(s) / ED Diagnoses   Final diagnoses:  None    ED Discharge Orders    None       Bernarda Caffey 11/12/17 2247    Carmin Muskrat, MD 11/13/17 2022

## 2017-11-12 NOTE — ED Provider Notes (Signed)
H/O COPD, no on home O2 Walking today and felt SOB with exertion Albuterol without relief. Continued at home No cough, fever, pain CXR normal Elevated D-dimer - pending VQ scan  If VQ scan delta trop negative, then d/ch home  11:15 - VQ scan is not available tonight and will be done first thing in the am. PA Schrosbree discussed with the patient who is comfortable waiting until morning for the study. No current symptoms. No need for NPO.   Patient's overnight in the ED was uneventful with no new or concerning symptoms. Patient care signed out to Simonne Martinet, PA-C, at end of shift to follow up on VQ scan and decide disposition.      Charlann Lange, PA-C 11/14/17 0033    Carmin Muskrat, MD 11/17/17 [phone]

## 2017-11-12 NOTE — ED Triage Notes (Signed)
Pt arrives to ED from home with complaints of SOB since today. EMS reports pt has hx of COPD while walking around [employer], pt went home and SOB worsened and called 911. Pt does not wear oxygen baseline, no pain upon arrival. Wheezing upon EMS arrival, retractions with inspiration, improved after breathing treatment admin. EMS gave 125 solumedrol, and 2 breathing treatments. Pt placed in position of comfort with bed locked and lowered, call bell in reach.

## 2017-11-13 ENCOUNTER — Emergency Department (HOSPITAL_COMMUNITY): Payer: Medicare Other

## 2017-11-13 MED ORDER — TECHNETIUM TC 99M DIETHYLENETRIAME-PENTAACETIC ACID: 30.0000 | Freq: Once | INTRAVENOUS | Status: DC | PRN

## 2017-11-13 MED ORDER — ALBUTEROL SULFATE (2.5 MG/3ML) 0.083% IN NEBU: 2.5000 mg | INHALATION_SOLUTION | Freq: Four times a day (QID) | RESPIRATORY_TRACT | 12 refills | Status: DC | PRN

## 2017-11-13 MED ORDER — TECHNETIUM TO 99M ALBUMIN AGGREGATED: 4.0000 | Freq: Once | INTRAVENOUS | Status: DC | PRN

## 2017-11-13 MED ORDER — ALBUTEROL SULFATE HFA 108 (90 BASE) MCG/ACT IN AERS: 2.0000 | INHALATION_SPRAY | Freq: Four times a day (QID) | RESPIRATORY_TRACT | 5 refills | Status: DC | PRN

## 2017-11-13 NOTE — ED Provider Notes (Signed)
  Care assumed from  Charlann Lange, PA-C at shift change with VQ scan pending  In brief, this patient is a 84 y.o. F with PMH/o COPD, Hypothyroidism who presented for evaluation of shortness of breath.  During evaluation, patient was found to have an elevated d-dimer.  She cannot tolerate a CT scan as her creatinine level is too high.  Patient was put in for a VQ scan but the team had Artie got home.  Patient agreed to stay overnight to get a VQ scan in the morning.  PLAN: Will get VQ scan.  If VQ scan is negative, patient is good to go home with follow-up with primary care doctor.  MDM:  VQ scan is negative for any acute abnormality.  No evidence of pulmonary embolism.  Discussed results with patient and daughter.  Patient has had any difficulty breathing since being here in the ED.  O2 sats have been greater than 95% on room air.  We will plan repeat vitals.  Suspect that this may be COPD exacerbation.  We will plan to send patient home with nebulizer treatment and solution.  Patient instructed to follow-up with her pulmonologist.  O2 sats are 97% on room air.  Patient ambulated to bathroom without any difficulty. Patient had ample opportunity for questions and discussion. All patient's questions were answered with full understanding. Strict return precautions discussed. Patient expresses understanding and agreement to plan.     1. COPD exacerbation [facility])        Volanda Napoleon, PA-C 11/13/17 1435    Macarthur Critchley, MD 11/14/17 [phone]

## 2017-11-13 NOTE — ED Notes (Signed)
Family called this RN for an update. Family notified pt is waiting for her VQ scan.

## 2017-11-13 NOTE — ED Notes (Signed)
Pt educated on need to stay for scan. Pt verbilizes understanding.

## 2017-11-13 NOTE — ED Notes (Signed)
Pt will be a boarder in the ER tonight, until the morning to get a V/Q scan. No staff in hospital at night to perform this scan.

## 2017-11-15 ENCOUNTER — Other Ambulatory Visit: Payer: Self-pay | Admitting: Cardiovascular Disease

## 2017-11-15 DIAGNOSIS — R0989 Other specified symptoms and signs involving the circulatory and respiratory systems: Secondary | ICD-10-CM

## 2017-11-15 DIAGNOSIS — R911 Solitary pulmonary nodule: Secondary | ICD-10-CM

## 2017-11-15 DIAGNOSIS — F419 Anxiety disorder, unspecified: Secondary | ICD-10-CM

## 2017-11-15 DIAGNOSIS — Z8673 Personal history of transient ischemic attack (TIA), and cerebral infarction without residual deficits: Secondary | ICD-10-CM

## 2017-11-15 DIAGNOSIS — I779 Disorder of arteries and arterioles, unspecified: Secondary | ICD-10-CM

## 2017-11-15 DIAGNOSIS — I739 Peripheral vascular disease, unspecified: Secondary | ICD-10-CM

## 2017-11-20 ENCOUNTER — Inpatient Hospital Stay: Payer: Medicare Other | Admitting: Internal Medicine

## 2017-11-24 ENCOUNTER — Ambulatory Visit: Payer: Medicare Other | Admitting: Internal Medicine

## 2017-11-24 ENCOUNTER — Encounter: Payer: Self-pay | Admitting: Internal Medicine

## 2017-11-24 VITALS — BP 168/70 | HR 75 | Temp 97.8°F | Wt 136.0 lb

## 2017-11-24 DIAGNOSIS — E039 Hypothyroidism, unspecified: Secondary | ICD-10-CM

## 2017-11-24 DIAGNOSIS — R739 Hyperglycemia, unspecified: Secondary | ICD-10-CM

## 2017-11-24 DIAGNOSIS — I10 Essential (primary) hypertension: Secondary | ICD-10-CM | POA: Diagnosis not present

## 2017-11-24 DIAGNOSIS — K31811 Angiodysplasia of stomach and duodenum with bleeding: Secondary | ICD-10-CM | POA: Diagnosis not present

## 2017-11-24 DIAGNOSIS — E876 Hypokalemia: Secondary | ICD-10-CM

## 2017-11-24 DIAGNOSIS — J438 Other emphysema: Secondary | ICD-10-CM | POA: Diagnosis not present

## 2017-11-24 DIAGNOSIS — I700 Atherosclerosis of aorta: Secondary | ICD-10-CM | POA: Diagnosis not present

## 2017-11-24 LAB — BASIC METABOLIC PANEL
BUN: 15 mg/dL (ref 6–23)
CALCIUM: 9 mg/dL (ref 8.4–10.5)
CO2: 28 meq/L (ref 19–32)
Chloride: 103 mEq/L (ref 96–112)
Creatinine, Ser: 1.15 mg/dL (ref 0.40–1.20)
GFR: 47.07 mL/min — AB (ref >60.00)
Glucose, Bld: 108 mg/dL — ABNORMAL HIGH (ref 70–99)
POTASSIUM: 4 meq/L (ref 3.5–5.1)
SODIUM: 139 meq/L (ref 135–145)

## 2017-11-24 LAB — POCT GLYCOSYLATED HEMOGLOBIN (HGB A1C): Hemoglobin A1C: 5.4

## 2017-11-24 NOTE — Progress Notes (Signed)
Subjective:    Patient ID: [name], female    DOB: 08/06/1924, 84 y.o.   MRN: [id number]  HPI 84 year old patient who is seen today for follow-up after a recent ED visit for COPD exacerbation.  Presently she is doing well.  She is scheduled for pulmonary follow-up Laboratory screen revealed potassium 3.1 and a random blood sugar 178.  Creatinine had increased to 1.47 She had denied any nausea vomiting or diarrhea.  Since her hospital discharge her oral intake has been good she generally feels well  She has had a recent follow-up with vascular surgery Complaints today include some ankle edema.  Past Medical History:  Diagnosis Date  . Anxiety   . Aortic arch atherosclerosis ([place]) 06/24/2014  . Atherosclerotic ulcer of aorta ([place]) 06/24/2014  . Carotid artery occlusion   . [place] DISEASE, LUMBAR 12/16/2008  . DIVERTICULOSIS, COLON 09/30/2008  . DYSPNEA 07/13/2008  . Graves disease   . History of embolic stroke 1/85/6314   Left brain  . HYPERLIPIDEMIA 03/06/2007  . HYPERTENSION 03/06/2007  . HYPOTHYROIDISM 10/13/2007  . OSTEOARTHRITIS 03/06/2007  . Personal history of colonic polyps 09/30/2008  . Stroke ([place])    06/2014           . TIA (transient ischemic attack)   . TOBACCO USE, QUIT 04/12/2009  . WEAKNESS 11/09/2007     Social History   Socioeconomic History  . Marital status: Divorced    Spouse name: Not on file  . Number of children: 4  . Years of education: college  . Highest education level: Not on file  Occupational History  . Occupation: N/A  Social Needs  . Financial resource strain: Not on file  . Food insecurity:    Worry: Not on file    Inability: Not on file  . Transportation needs:    Medical: Not on file    Non-medical: Not on file  Tobacco Use  . Smoking status: Former Smoker    Last attempt to quit: 07/08/1978    Years since quitting: 39.4  . Smokeless tobacco: Never Used  Substance and Sexual Activity  . Alcohol use: Yes    Comment: "red wine"- some  nights  . Drug use: No  . Sexual activity: Not on file  Lifestyle  . Physical activity:    Days per week: Not on file    Minutes per session: Not on file  . Stress: Not on file  Relationships  . Social connections:    Talks on phone: Not on file    Gets together: Not on file    Attends religious service: Not on file    Active member of club or organization: Not on file    Attends meetings of clubs or organizations: Not on file    Relationship status: Not on file  . Intimate partner violence:    Fear of current or ex partner: Not on file    Emotionally abused: Not on file    Physically abused: Not on file    Forced sexual activity: Not on file  Other Topics Concern  . Not on file  Social History Narrative      Patient is right handed.   Patient drinks 1 cup caffeine daily.    Past Surgical History:  Procedure Laterality Date  . CARDIAC CATHETERIZATION    . CATARACT EXTRACTION Bilateral   . CHOLECYSTECTOMY    . ENDARTERECTOMY Left 11/13/2015   Procedure: LEFT CAROTID ARTERY ENDARTERECTOMY;  Surgeon: Conrad [place], MD;  Location: [facility] OR;  Service: Vascular;  Laterality: Left;  . ESOPHAGOGASTRODUODENOSCOPY (EGD) WITH PROPOFOL N/A 10/11/2016   Procedure: ESOPHAGOGASTRODUODENOSCOPY (EGD) WITH PROPOFOL;  Surgeon: Mauri Pole, MD;  Location: [facility] ENDOSCOPY;  Service: Endoscopy;  Laterality: N/A;  . KNEE SURGERY    . PATCH ANGIOPLASTY Left 11/13/2015   Procedure: WITH 1CM X 6CM  XENOSURE BIOLOGIC PATCH ANGIOPLASTY;  Surgeon: Conrad Santel, MD;  Location: [place];  Service: Vascular;  Laterality: Left;  . TEE WITHOUT CARDIOVERSION N/A 06/24/2014   Procedure: TRANSESOPHAGEAL ECHOCARDIOGRAM (TEE);  Surgeon: Sanda Klein, MD;  Location: [facility] ENDOSCOPY;  Service: Cardiovascular;  Laterality: N/A;    Family History  Problem Relation Age of Onset  . Stomach cancer Maternal Grandmother   . Colon cancer Neg Hx     Allergies  Allergen Reactions  . Catapres [Clonidine Hcl] Other (See  Comments)    Made pt feel horrible, shaky, weak and nausea  . Lisinopril Anaphylaxis    Tongue swelling  . Labetalol Hcl Other (See Comments)    Caused bradycardia and syncope    Current Outpatient Medications on File Prior to Visit  Medication Sig Dispense Refill  . albuterol (PROAIR HFA) 108 (90 Base) MCG/ACT inhaler Inhale 2 puffs into the lungs every 6 (six) hours as needed for wheezing or shortness of breath. 8.5 Inhaler 5  . albuterol (PROVENTIL) (2.5 MG/3ML) 0.083% nebulizer solution Take 3 mLs (2.5 mg total) by nebulization every 6 (six) hours as needed for wheezing or shortness of breath. 75 mL 12  . amLODipine (NORVASC) 10 MG tablet TAKE 1 TABLET EVERY DAY 90 tablet 3  . ANORO ELLIPTA 62.5-25 MCG/INH AEPB INHALE 1 PUFF INTO THE LUNGS EVERY DAY 60 each 5  . aspirin EC 81 MG tablet Take 1 tablet (81 mg total) by mouth daily. 30 tablet 0  . atorvastatin (LIPITOR) 80 MG tablet TAKE 1 TABLET BY MOUTH EVERY DAY AT 6PM 90 tablet 2  . Glycerin-Hypromellose-PEG 400 ([facility] DRY EYE RELIEF) 0.2-0.2-1 % SOLN Place 1 drop into both eyes as needed.    . hydrALAZINE (APRESOLINE) 25 MG tablet TAKE 3 TABLETS BY MOUTH 3 TIMES A DAY 270 tablet 9  . levothyroxine (SYNTHROID, LEVOTHROID) 75 MCG tablet TAKE 1 TABLET BY MOUTH EVERY DAY 90 tablet 1   No current facility-administered medications on file prior to visit.     BP (!) 168/70 (BP Location: Right Arm, Patient Position: Sitting, Cuff Size: Large)   Pulse 75   Temp 97.8 F (36.6 C) (Oral)   Wt 136 lb (61.7 kg)   SpO2 96%   BMI 21.95 kg/m      Review of Systems  Constitutional: Negative.   HENT: Negative for congestion, dental problem, hearing loss, rhinorrhea, sinus pressure, sore throat and tinnitus.   Eyes: Negative for pain, discharge and visual disturbance.  Respiratory: Positive for shortness of breath. Negative for cough.   Cardiovascular: Positive for leg swelling. Negative for chest pain and palpitations.  Gastrointestinal:  Negative for abdominal distention, abdominal pain, blood in stool, constipation, diarrhea, nausea and vomiting.  Genitourinary: Negative for difficulty urinating, dysuria, flank pain, frequency, hematuria, pelvic pain, urgency, vaginal bleeding, vaginal discharge and vaginal pain.  Musculoskeletal: Negative for arthralgias, gait problem and joint swelling.  Skin: Negative for rash.  Neurological: Negative for dizziness, syncope, speech difficulty, weakness, numbness and headaches.  Hematological: Negative for adenopathy.  Psychiatric/Behavioral: Negative for agitation, behavioral problems and dysphoric mood. The patient is not nervous/anxious.  Objective:   Physical Exam  Constitutional: She is oriented to person, place, and time. She appears well-developed and well-nourished.  Blood pressure 152/70  HENT:  Head: Normocephalic.  Right Ear: External ear normal.  Left Ear: External ear normal.  Mouth/Throat: Oropharynx is clear and moist.  Eyes: Pupils are equal, round, and reactive to light. Conjunctivae and EOM are normal.  Neck: Normal range of motion. Neck supple. No thyromegaly present.  Cardiovascular: Normal rate, regular rhythm and intact distal pulses.  Murmur heard. Grade 3/6 systolic murmur loudest at the primary aortic area  Pulmonary/Chest: Effort normal and breath sounds normal.  Diminished breath sounds but essentially clear  Abdominal: Soft. Bowel sounds are normal. She exhibits no mass. There is no tenderness.  Musculoskeletal: Normal range of motion. She exhibits edema.  +1 right ankle edema  Lymphadenopathy:    She has no cervical adenopathy.  Neurological: She is alert and oriented to person, place, and time.  Skin: Skin is warm and dry. No rash noted.  Psychiatric: She has a normal mood and affect. Her behavior is normal.          Assessment & Plan:  COPD exacerbation.  Presently doing well.  No change in medical regimen.  Patient now has a home  nebulizer for albuterol as needed.  Follow-up pulmonary medicine  Hypertension Impaired glucose tolerance.  Will review a hemoglobin A1c History of azotemia hypokalemia.  We will repeat Bmet Pedal edema.  This is mild and resolves after overnight elevation.  Likely aggravated by antihypertensive regimen.  Will observe and continue low-salt diet  Continue low-salt diet  Nyoka Cowden

## 2017-11-24 NOTE — Patient Instructions (Signed)
Limit your sodium (Salt) intake  Return in 3 months for follow-up  Pulmonary follow-up as scheduled

## 2017-11-26 ENCOUNTER — Ambulatory Visit: Payer: Medicare Other | Admitting: Acute Care

## 2017-11-26 ENCOUNTER — Encounter: Payer: Self-pay | Admitting: Acute Care

## 2017-11-26 DIAGNOSIS — J438 Other emphysema: Secondary | ICD-10-CM

## 2017-11-26 MED ORDER — AEROCHAMBER MV MISC: 0 refills | Status: DC

## 2017-11-26 MED ORDER — FLUTICASONE FUROATE-VILANTEROL 100-25 MCG/INH IN AEPB: 1.0000 | INHALATION_SPRAY | Freq: Every day | RESPIRATORY_TRACT | 0 refills | Status: DC

## 2017-11-26 NOTE — Progress Notes (Signed)
History of Present Illness [name] is a 84 y.o. female with COPD and hypothyroidism. She is followed by Dr. Vaughan Browner  HPI: Moderate COPD Vocal cord paralysis  HPI: [name] is a 84 year old with complaints of dyspnea on exertion.  The problem started in May 2017 after a carotid artery end arterectomy. This procedure was complicated by left vocal cord paralysis, hoarseness. She complains of daily cough with sputum production, wheezing. She denies any fevers, chills, hemoptysis. She had been tried on Advair for 2 months with some improvement in symptoms. She has difficulty clearing secretions. She has has a flutter valve and spirometerat home but is not using this on a regular basis.  She was hospitalized in the emergency room in October 2017 for COPD exacerbation. She was discharged on a prednisone taper and amoxicillin.  Hospitalized in April 2018 with severe anemia, bleeding AVMs.  Underwent EGD with cauterization, clipping of bleeding lesion.   She was had a ED visit 5/8-5/10 COPD,  for evaluation of shortness of breath, worse on exertion.  During evaluation, patient was found to have an elevated d-dimer.  She could not  tolerate a CT scan as her creatinine level was  too high. VQ scan was  negative for any acute abnormality.  No evidence of pulmonary embolism.   Patient has not  had any difficulty breathing since being  in the ED.  O2 sats have been greater than 95% on room air.    They Suspected  that this was  COPD exacerbation. She was  send  home with nebulizer treatment and solution.  Patient instructed to follow-up with her pulmonologist. O2 sats were  97% on room air.  Patient ambulated to bathroom without any difficulty. Strict return precautions discussed. Patient expresses understanding and agreement to plan. She was discharged   11/26/2017   Hospital follow up: Pt. Presents for hospital follow up. She was seen in the ED from 11/12/2017-11/13/2017  for what was a suspected  COPD exacerbation. She had a + d-dimer and VQ scan was done ( Elevated creatinine) and was negative for PE. She was discharged home  with nebs and asked to follow up with [place] at discharge. She states she has had no further episodes of dyspnea. She is maintained on  Anoro and albuterol PRN. She uses incentive spirometer and flutter valve 3 times/ day, as Dr.Mannam feels difficulty clearing secretions is due to the vocal cord paralysis.She states she is doing well. She has not had to use her nebs since discharge. She is compliant with her Anoro daily.She is not using her IS or flutter valve at present.She si unsure of what the trigger of her flare was. She thinks it may have been high humidity or pollen.She denies fever, chest pain, orthopnea or hemoptysis.  Denies history of DVT/PE, exogenous estrogen, personal history of cancer, unilateral leg swelling/calf pain, recent surgery or immobilization.  Test Results:  11/13/2017 VQ scan IMPRESSION: No evidence of ventilation perfusion mismatch to suggest pulmonary embolism.  11/12/2017>>  IMPRESSION: Stable chronic lung changes.  Emphysema (ICD10-J43.9).  No infiltrate or congestive heart failure.  PFT's>> 04/02/2016 FVC 2.389 or 101% FEV1 131 or 75% F/F ratio 76%  TLC 5.62 of 107% DLCO 42%  Interpretation: Moderate Obstructive Airways Disease Severe Diffusion Defect Consistent with COPD.   11/12/2017>> EKG EKG Interpretation  Date/Time:                        Wednesday Nov 12 2017  17:22:24 EDT Ventricular Rate:   85 PR Interval:                        QRS Duration:        90 QT Interval:                      388 QTC Calculation:    437 R Axis:                         47 Text Interpretation:  Age not entered, assumed to be  84 years old for purpose of ECG interpretation Sinus rhythm Atrial premature complex Borderline low voltage, extremity leads Nonspecific repol abnormality, lateral leads Baseline wander in lead(s) III aVF Abnormal ekg    CBC Latest Ref Rng & Units 11/12/2017 08/20/2017 02/18/2017  WBC 4.0 - 10.5 K/uL 6.6 5.4 5.5  Hemoglobin 12.0 - 15.0 g/dL 12.4 13.3 13.2  Hematocrit 36.0 - 46.0 % 38.1 38.9 40.9  Platelets 150 - 400 K/uL 175 213 213.0    BMP Latest Ref Rng & Units 11/24/2017 11/12/2017 08/20/2017  Glucose 70 - 99 mg/dL 108(H) 178(H) 113(H)  BUN 6 - 23 mg/dL 15 18 18   Creatinine 0.40 - 1.20 mg/dL 1.15 1.49(H) 1.18(H)  BUN/Creat Ratio 12 - 28 - - 15  Sodium 135 - 145 mEq/L 139 138 140  Potassium 3.5 - 5.1 mEq/L 4.0 3.1(L) 4.0  Chloride 96 - 112 mEq/L 103 105 102  CO2 19 - 32 mEq/L 28 23 22   Calcium 8.4 - 10.5 mg/dL 9.0 8.8(L) 9.0    BNP    Component Value Date/Time   BNP 111.9 (H) 11/12/2017 1825    ProBNP    Component Value Date/Time   PROBNP 46.0 12/11/2015 1216    PFT    Component Value Date/Time   FEV1PRE 1.31 04/02/2016 1044   FEV1POST 1.43 04/02/2016 1044   FVCPRE 2.39 04/02/2016 1044   FVCPOST 2.54 04/02/2016 1044   TLC 5.62 04/02/2016 1044   DLCOUNC 10.92 04/02/2016 1044   PREFEV1FVCRT 55 04/02/2016 1044   PSTFEV1FVCRT 56 04/02/2016 1044    Dg Chest 2 View  Result Date: 11/12/2017 CLINICAL DATA:  84 year old female with shortness of breath. Initial encounter. EXAM: CHEST - 2 VIEW COMPARISON:  08/05/2017 and 12/11/2015 chest x-ray. 04/05/2016 chest CT. FINDINGS: Chronic lung changes (emphysema) including nodular density mid left lung stable since 2017. No infiltrate, congestive heart failure or pneumothorax. Heart size top-normal. Calcified slightly tortuous aorta. No acute osseous abnormality. IMPRESSION: Stable chronic lung changes.  Emphysema (ICD10-J43.9). No infiltrate or congestive heart failure. Aortic Atherosclerosis (ICD10-I70.0). Electronically Signed   By: Genia Del M.D.   On: 11/12/2017 19:15   Nm Pulmonary Vent And Perf (v/q Scan)  Result Date: 11/13/2017 CLINICAL DATA:  COPD with shortness of breath EXAM: NUCLEAR MEDICINE VENTILATION - PERFUSION LUNG SCAN TECHNIQUE:  Ventilation images were obtained in multiple projections using inhaled aerosol Tc-19m DTPA. Perfusion images were obtained in multiple projections after intravenous injection of Tc-53m-MAA. RADIOPHARMACEUTICALS:  30.2 mCi of Tc-50m DTPA aerosol inhalation and 4.1 mCi Tc14m-MAA IV COMPARISON:  11/12/2017 FINDINGS: Ventilation: Ventilation images demonstrate significant hyperaeration consistent with COPD. Patchy uptake is noted within the left upper lobe which corresponds with areas of emphysematous change seen on prior plain film. Perfusion: Perfusion images demonstrate no wedge-shaped defect to suggest pulmonary embolism. Matched defects with the changes of COPD are noted within the left lung. IMPRESSION:  No evidence of ventilation perfusion mismatch to suggest pulmonary embolism. Electronically Signed   By: Inez Catalina M.D.   On: 11/13/2017 11:58     Past medical hx Past Medical History:  Diagnosis Date  . Anxiety   . Aortic arch atherosclerosis ([place]) 06/24/2014  . Atherosclerotic ulcer of aorta ([place]) 06/24/2014  . Carotid artery occlusion   . [place] DISEASE, LUMBAR 12/16/2008  . DIVERTICULOSIS, COLON 09/30/2008  . DYSPNEA 07/13/2008  . Graves disease   . History of embolic stroke 11/15/2583   Left brain  . HYPERLIPIDEMIA 03/06/2007  . HYPERTENSION 03/06/2007  . HYPOTHYROIDISM 10/13/2007  . OSTEOARTHRITIS 03/06/2007  . Personal history of colonic polyps 09/30/2008  . Stroke ([place])    06/2014           . TIA (transient ischemic attack)   . TOBACCO USE, QUIT 04/12/2009  . WEAKNESS 11/09/2007     Social History   Tobacco Use  . Smoking status: Former Smoker    Last attempt to quit: 07/08/1978    Years since quitting: 39.4  . Smokeless tobacco: Never Used  Substance Use Topics  . Alcohol use: Yes    Comment: "red wine"- some nights  . Drug use: No    [name] reports that she quit smoking about 39 years ago. She has never used smokeless tobacco. She reports that she drinks alcohol. She reports  that she does not use drugs.  Tobacco Cessation: Former smoker quit 40 years ago   Past surgical hx, Family hx, Social hx all reviewed.  Current Outpatient Medications on File Prior to Visit  Medication Sig  . albuterol (PROAIR HFA) 108 (90 Base) MCG/ACT inhaler Inhale 2 puffs into the lungs every 6 (six) hours as needed for wheezing or shortness of breath.  [name] Kitchen albuterol (PROVENTIL) (2.5 MG/3ML) 0.083% nebulizer solution Take 3 mLs (2.5 mg total) by nebulization every 6 (six) hours as needed for wheezing or shortness of breath.  [name] Kitchen amLODipine (NORVASC) 10 MG tablet TAKE 1 TABLET EVERY DAY  . ANORO ELLIPTA 62.5-25 MCG/INH AEPB INHALE 1 PUFF INTO THE LUNGS EVERY DAY  . aspirin EC 81 MG tablet Take 1 tablet (81 mg total) by mouth daily.  [name] Kitchen atorvastatin (LIPITOR) 80 MG tablet TAKE 1 TABLET BY MOUTH EVERY DAY AT 6PM  . Glycerin-Hypromellose-PEG 400 ([facility] DRY EYE RELIEF) 0.2-0.2-1 % SOLN Place 1 drop into both eyes as needed.  . hydrALAZINE (APRESOLINE) 25 MG tablet TAKE 3 TABLETS BY MOUTH 3 TIMES A DAY  . levothyroxine (SYNTHROID, LEVOTHROID) 75 MCG tablet TAKE 1 TABLET BY MOUTH EVERY DAY   No current facility-administered medications on file prior to visit.      Allergies  Allergen Reactions  . Catapres [Clonidine Hcl] Other (See Comments)    Made pt feel horrible, shaky, weak and nausea  . Lisinopril Anaphylaxis    Tongue swelling  . Labetalol Hcl Other (See Comments)    Caused bradycardia and syncope    Review Of Systems:  Constitutional:   No  weight loss, night sweats,  Fevers, chills, fatigue, or  lassitude.  HEENT:   No headaches,  Difficulty swallowing,  Tooth/dental problems, or  Sore throat,                No sneezing, itching, ear ache, nasal congestion, post nasal drip,   CV:  No chest pain,  Orthopnea, PND, + swelling in lower extremities, anasarca, dizziness, palpitations, syncope.   GI  No heartburn, indigestion, abdominal pain, nausea, vomiting, diarrhea, change  in  bowel habits, loss of appetite, bloody stools.   Resp: + intermittent  shortness of breath with exertion less at rest.  No excess mucus, no productive cough,  rare non-productive cough,  No coughing up of blood.  No change in color of mucus.  Occasional  wheezing.  No chest wall deformity  Skin: no rash or lesions.  GU: no dysuria, change in color of urine, no urgency or frequency.  No flank pain, no hematuria   MS:  No joint pain or swelling.  No decreased range of motion.  No back pain.  Psych:  No change in mood or affect. No depression or anxiety.  No memory loss.   Vital Signs BP (!) 158/64 (BP Location: Left Arm, Cuff Size: Normal)   Pulse 78   Ht 5\' 6"  (1.676 m)   Wt 136 lb 9.6 oz (62 kg)   SpO2 96%   BMI 22.05 kg/m    Physical Exam:  General- No distress,  A&Ox3, pleasant ENT: No sinus tenderness, TM clear, pale nasal mucosa, no oral exudate,no post nasal drip, no LAN Cardiac: S1, S2, regular rate and rhythm, no murmur Chest: Faint exp.  wheeze/ No rales/ dullness; no accessory muscle use, no nasal flaring, no sternal retractions Abd.: Soft Non-tender, ND, BS + Ext: No clubbing cyanosis, Trace BLE  edema Neuro:  normal strength, MAE x 4, A&O x 3 Skin: No rashes, warm and dry Psych: normal mood and behavior   Assessment/Plan  Other emphysema (HCC) Flare requiring ED visit Negative for PE ( VQ scan) Resolved with neb treatments Discharged with Neb treatments Plan: We will try Breo in place of Anoro x 2 weeks. This is called a therapeutic trial. Try the Breo x 2 weeks  Rinse mouth after use If you like the Breo, let us know and we will send in a prescription If you do not like the Breo,  You  can  Go back to the Anoro. We will prescribe a spacer for use with your rescue medication. Albuterol nebs for breakthrough shortness of breath up to 4 times a day. Let us know if you are needing it more frequently. Follow up with Dr. Vaughan Browner or Judson Roch in 2  months.   Magdalen Spatz, NP 11/26/2017  3:57 PM

## 2017-11-26 NOTE — Addendum Note (Signed)
Addended by: Vivia Ewing on: 11/26/2017 04:09 PM   Modules accepted: Orders

## 2017-11-26 NOTE — Assessment & Plan Note (Addendum)
Flare requiring ED visit Negative for PE ( VQ scan) Resolved with neb treatments Discharged with Neb treatments Plan: We will try Breo in place of Anoro x 2 weeks. This is called a therapeutic trial. Try the Breo x 2 weeks  Rinse mouth after use If you like the Breo, let us know and we will send in a prescription If you do not like the Breo,  You  can  Go back to the Anoro. We will prescribe a spacer for use with your rescue medication. Albuterol nebs for breakthrough shortness of breath up to 4 times a day. Let us know if you are needing it more frequently. Follow up with Dr. Vaughan Browner or Judson Roch in 2 months.

## 2017-11-26 NOTE — Patient Instructions (Addendum)
It is nice to meet you today. We will try Breo in place of Anoro x 2 weeks. This is called a therapeutic trial. Try the Breo x 2 weeks  Rinse mouth after use If you like the Breo, let us know and we will send in a prescription ( Call us) If you do not like the Breo,  You  can  Go back to the Anoro. We will prescribe a spacer for use with your rescue medication. Use rescue as needed for breakthrough shortness of breath up to 4 times a day. Use incentive spirometer and flutter valve 3 times/ day as Dr. Vaughan Browner prescribed.. Albuterol nebs as needed for breakthrough shortness of breath ( If you are not using rescue inhaler) Follow up with Dr. Vaughan Browner or Judson Roch in 2 months. Please contact office for sooner follow up if symptoms do not improve or worsen or seek emergency care   May need follow up PFT's if flares become more frequent.

## 2018-01-12 ENCOUNTER — Observation Stay (HOSPITAL_COMMUNITY)
Admission: EM | Admit: 2018-01-12 | Discharge: 2018-01-14 | Disposition: A | Discharge status: Home / Self Care | Payer: Medicare Other | Attending: Family Medicine | Admitting: Family Medicine

## 2018-01-12 ENCOUNTER — Ambulatory Visit: Payer: Medicare Other | Admitting: Pulmonary Disease

## 2018-01-12 ENCOUNTER — Other Ambulatory Visit: Payer: Self-pay

## 2018-01-12 ENCOUNTER — Emergency Department (HOSPITAL_COMMUNITY): Payer: Medicare Other

## 2018-01-12 ENCOUNTER — Encounter (HOSPITAL_COMMUNITY): Payer: Self-pay | Admitting: Emergency Medicine

## 2018-01-12 DIAGNOSIS — I129 Hypertensive chronic kidney disease with stage 1 through stage 4 chronic kidney disease, or unspecified chronic kidney disease: Secondary | ICD-10-CM | POA: Insufficient documentation

## 2018-01-12 DIAGNOSIS — Z79899 Other long term (current) drug therapy: Secondary | ICD-10-CM | POA: Insufficient documentation

## 2018-01-12 DIAGNOSIS — Z7951 Long term (current) use of inhaled steroids: Secondary | ICD-10-CM | POA: Insufficient documentation

## 2018-01-12 DIAGNOSIS — I447 Left bundle-branch block, unspecified: Secondary | ICD-10-CM | POA: Insufficient documentation

## 2018-01-12 DIAGNOSIS — Z79890 Hormone replacement therapy (postmenopausal): Secondary | ICD-10-CM | POA: Insufficient documentation

## 2018-01-12 DIAGNOSIS — R0602 Shortness of breath: Principal | ICD-10-CM | POA: Insufficient documentation

## 2018-01-12 DIAGNOSIS — Z9842 Cataract extraction status, left eye: Secondary | ICD-10-CM | POA: Diagnosis not present

## 2018-01-12 DIAGNOSIS — I639 Cerebral infarction, unspecified: Secondary | ICD-10-CM | POA: Diagnosis not present

## 2018-01-12 DIAGNOSIS — N183 Chronic kidney disease, stage 3 unspecified: Secondary | ICD-10-CM | POA: Diagnosis present

## 2018-01-12 DIAGNOSIS — Z86010 Personal history of colonic polyps: Secondary | ICD-10-CM | POA: Insufficient documentation

## 2018-01-12 DIAGNOSIS — I060 Rheumatic aortic stenosis: Secondary | ICD-10-CM | POA: Diagnosis not present

## 2018-01-12 DIAGNOSIS — Z888 Allergy status to other drugs, medicaments and biological substances status: Secondary | ICD-10-CM | POA: Insufficient documentation

## 2018-01-12 DIAGNOSIS — I10 Essential (primary) hypertension: Secondary | ICD-10-CM | POA: Diagnosis not present

## 2018-01-12 DIAGNOSIS — Z7982 Long term (current) use of aspirin: Secondary | ICD-10-CM | POA: Diagnosis not present

## 2018-01-12 DIAGNOSIS — Z9841 Cataract extraction status, right eye: Secondary | ICD-10-CM | POA: Insufficient documentation

## 2018-01-12 DIAGNOSIS — Z9049 Acquired absence of other specified parts of digestive tract: Secondary | ICD-10-CM | POA: Insufficient documentation

## 2018-01-12 DIAGNOSIS — Z9889 Other specified postprocedural states: Secondary | ICD-10-CM | POA: Diagnosis not present

## 2018-01-12 DIAGNOSIS — Z87891 Personal history of nicotine dependence: Secondary | ICD-10-CM | POA: Insufficient documentation

## 2018-01-12 DIAGNOSIS — E785 Hyperlipidemia, unspecified: Secondary | ICD-10-CM | POA: Insufficient documentation

## 2018-01-12 DIAGNOSIS — E0500 Thyrotoxicosis with diffuse goiter without thyrotoxic crisis or storm: Secondary | ICD-10-CM | POA: Insufficient documentation

## 2018-01-12 DIAGNOSIS — M1990 Unspecified osteoarthritis, unspecified site: Secondary | ICD-10-CM | POA: Diagnosis not present

## 2018-01-12 DIAGNOSIS — N1832 Chronic kidney disease, stage 3b: Secondary | ICD-10-CM | POA: Diagnosis present

## 2018-01-12 DIAGNOSIS — J449 Chronic obstructive pulmonary disease, unspecified: Secondary | ICD-10-CM | POA: Diagnosis present

## 2018-01-12 DIAGNOSIS — J441 Chronic obstructive pulmonary disease with (acute) exacerbation: Secondary | ICD-10-CM

## 2018-01-12 DIAGNOSIS — E039 Hypothyroidism, unspecified: Secondary | ICD-10-CM | POA: Insufficient documentation

## 2018-01-12 DIAGNOSIS — Z8673 Personal history of transient ischemic attack (TIA), and cerebral infarction without residual deficits: Secondary | ICD-10-CM | POA: Diagnosis not present

## 2018-01-12 DIAGNOSIS — I6529 Occlusion and stenosis of unspecified carotid artery: Secondary | ICD-10-CM | POA: Diagnosis not present

## 2018-01-12 LAB — BASIC METABOLIC PANEL
ANION GAP: 9 (ref 5–15)
BUN: 16 mg/dL (ref 8–23)
CO2: 23 mmol/L (ref 22–32)
Calcium: 8.9 mg/dL (ref 8.9–10.3)
Chloride: 107 mmol/L (ref 98–111)
Creatinine, Ser: 1.26 mg/dL — ABNORMAL HIGH (ref 0.44–1.00)
GFR, EST AFRICAN AMERICAN: 42 mL/min — AB (ref >60)
GFR, EST NON AFRICAN AMERICAN: 36 mL/min — AB (ref >60)
Glucose, Bld: 98 mg/dL (ref 70–99)
POTASSIUM: 3.6 mmol/L (ref 3.5–5.1)
SODIUM: 139 mmol/L (ref 135–145)

## 2018-01-12 LAB — CBC WITH DIFFERENTIAL/PLATELET
ABS IMMATURE GRANULOCYTES: 0 10*3/uL (ref 0.0–0.1)
BASOS ABS: 0.1 10*3/uL (ref 0.0–0.1)
BASOS PCT: 2 %
Eosinophils Absolute: 0.2 10*3/uL (ref 0.0–0.7)
Eosinophils Relative: 5 %
HCT: 41.5 % (ref 36.0–46.0)
Hemoglobin: 13.1 g/dL (ref 12.0–15.0)
IMMATURE GRANULOCYTES: 0 %
Lymphocytes Relative: 15 %
Lymphs Abs: 0.7 10*3/uL (ref 0.7–4.0)
MCH: 31 pg (ref 26.0–34.0)
MCHC: 31.6 g/dL (ref 30.0–36.0)
MCV: 98.3 fL (ref 78.0–100.0)
Monocytes Absolute: 0.8 10*3/uL (ref 0.1–1.0)
Monocytes Relative: 16 %
NEUTROS ABS: 3 10*3/uL (ref 1.7–7.7)
NEUTROS PCT: 62 %
PLATELETS: 167 10*3/uL (ref 150–400)
RBC: 4.22 MIL/uL (ref 3.87–5.11)
RDW: 13.4 % (ref 11.5–15.5)
WBC: 4.8 10*3/uL (ref 4.0–10.5)

## 2018-01-12 LAB — I-STAT VENOUS BLOOD GAS, ED
ACID-BASE EXCESS: 2 mmol/L (ref 0.0–2.0)
BICARBONATE: 26.2 mmol/L (ref 20.0–28.0)
O2 Saturation: 78 %
PCO2 VEN: 38.2 mmHg — AB (ref 44.0–60.0)
PO2 VEN: 40 mmHg (ref 32.0–45.0)
TCO2: 27 mmol/L (ref 22–32)
pH, Ven: 7.444 — ABNORMAL HIGH (ref 7.250–7.430)

## 2018-01-12 LAB — I-STAT TROPONIN, ED: Troponin i, poc: 0.02 ng/mL (ref 0.00–0.08)

## 2018-01-12 MED ORDER — AMLODIPINE BESYLATE 10 MG PO TABS
10.0000 mg | ORAL_TABLET | Freq: Every day | ORAL | Status: DC
  Administered 2018-01-13 – 2018-01-14 (×2): 10 mg via ORAL
  Filled 2018-01-12 (×2): qty 1

## 2018-01-12 MED ORDER — LEVOTHYROXINE SODIUM 75 MCG PO TABS
75.0000 ug | ORAL_TABLET | Freq: Every day | ORAL | Status: DC
  Administered 2018-01-13 – 2018-01-14 (×2): 75 ug via ORAL
  Filled 2018-01-12 (×2): qty 1

## 2018-01-12 MED ORDER — ALBUTEROL SULFATE (2.5 MG/3ML) 0.083% IN NEBU
5.0000 mg | INHALATION_SOLUTION | Freq: Once | RESPIRATORY_TRACT | Status: DC
  Filled 2018-01-12 (×2): qty 6

## 2018-01-12 MED ORDER — IPRATROPIUM-ALBUTEROL 0.5-2.5 (3) MG/3ML IN SOLN
3.0000 mL | Freq: Once | RESPIRATORY_TRACT | Status: AC
Start: 2018-01-12 — End: 2018-01-12
  Administered 2018-01-12: 3 mL via RESPIRATORY_TRACT
  Filled 2018-01-12: qty 3

## 2018-01-12 MED ORDER — MORPHINE SULFATE (PF) 4 MG/ML IV SOLN: 0.5000 mg | INTRAVENOUS | Status: DC | PRN

## 2018-01-12 MED ORDER — ONDANSETRON HCL 4 MG/2ML IJ SOLN: 4.0000 mg | Freq: Four times a day (QID) | INTRAMUSCULAR | Status: DC | PRN

## 2018-01-12 MED ORDER — ASPIRIN 81 MG PO CHEW
324.0000 mg | CHEWABLE_TABLET | Freq: Once | ORAL | Status: AC
  Administered 2018-01-12: 324 mg via ORAL
  Filled 2018-01-12: qty 4

## 2018-01-12 MED ORDER — ENOXAPARIN SODIUM 40 MG/0.4ML ~~LOC~~ SOLN
40.0000 mg | SUBCUTANEOUS | Status: DC
  Administered 2018-01-13: 40 mg via SUBCUTANEOUS
  Filled 2018-01-12: qty 0.4

## 2018-01-12 MED ORDER — NITROGLYCERIN 0.4 MG SL SUBL: 0.4000 mg | SUBLINGUAL_TABLET | SUBLINGUAL | Status: DC | PRN

## 2018-01-12 MED ORDER — ZOLPIDEM TARTRATE 5 MG PO TABS: 5.0000 mg | ORAL_TABLET | Freq: Every evening | ORAL | Status: DC | PRN

## 2018-01-12 MED ORDER — HYDRALAZINE HCL 20 MG/ML IJ SOLN: 5.0000 mg | INTRAMUSCULAR | Status: DC | PRN

## 2018-01-12 MED ORDER — HYDRALAZINE HCL 50 MG PO TABS
75.0000 mg | ORAL_TABLET | Freq: Three times a day (TID) | ORAL | Status: DC
  Administered 2018-01-13 – 2018-01-14 (×6): 75 mg via ORAL
  Filled 2018-01-12 (×6): qty 1

## 2018-01-12 MED ORDER — FLUTICASONE FUROATE-VILANTEROL 100-25 MCG/INH IN AEPB: 1.0000 | INHALATION_SPRAY | Freq: Every day | RESPIRATORY_TRACT | Status: DC

## 2018-01-12 MED ORDER — IPRATROPIUM-ALBUTEROL 0.5-2.5 (3) MG/3ML IN SOLN
3.0000 mL | RESPIRATORY_TRACT | Status: DC
  Administered 2018-01-12: 3 mL via RESPIRATORY_TRACT
  Filled 2018-01-12: qty 3

## 2018-01-12 MED ORDER — DM-GUAIFENESIN ER 30-600 MG PO TB12: 1.0000 | ORAL_TABLET | Freq: Two times a day (BID) | ORAL | Status: DC | PRN

## 2018-01-12 MED ORDER — UMECLIDINIUM-VILANTEROL 62.5-25 MCG/INH IN AEPB
1.0000 | INHALATION_SPRAY | Freq: Every day | RESPIRATORY_TRACT | Status: DC
  Administered 2018-01-13 – 2018-01-14 (×2): 1 via RESPIRATORY_TRACT
  Filled 2018-01-12: qty 14

## 2018-01-12 MED ORDER — ACETAMINOPHEN 325 MG PO TABS: 650.0000 mg | ORAL_TABLET | ORAL | Status: DC | PRN

## 2018-01-12 MED ORDER — ALBUTEROL SULFATE (2.5 MG/3ML) 0.083% IN NEBU: 2.5000 mg | INHALATION_SOLUTION | RESPIRATORY_TRACT | Status: DC | PRN

## 2018-01-12 MED ORDER — PANTOPRAZOLE SODIUM 40 MG PO TBEC
40.0000 mg | DELAYED_RELEASE_TABLET | Freq: Every day | ORAL | Status: DC
  Administered 2018-01-13 – 2018-01-14 (×2): 40 mg via ORAL
  Filled 2018-01-12 (×2): qty 1

## 2018-01-12 MED ORDER — ATORVASTATIN CALCIUM 80 MG PO TABS
80.0000 mg | ORAL_TABLET | Freq: Every day | ORAL | Status: DC
  Administered 2018-01-13 – 2018-01-14 (×2): 80 mg via ORAL
  Filled 2018-01-12: qty 1

## 2018-01-12 MED ORDER — HYPROMELLOSE (GONIOSCOPIC) 2.5 % OP SOLN
1.0000 [drp] | OPHTHALMIC | Status: DC | PRN
  Filled 2018-01-12: qty 15

## 2018-01-12 MED ORDER — GLYCERIN-HYPROMELLOSE-PEG 400 0.2-0.2-1 % OP SOLN: 1.0000 [drp] | OPHTHALMIC | Status: DC | PRN

## 2018-01-12 MED ORDER — ASPIRIN EC 325 MG PO TBEC
325.0000 mg | DELAYED_RELEASE_TABLET | Freq: Every day | ORAL | Status: DC
  Administered 2018-01-13 – 2018-01-14 (×2): 325 mg via ORAL
  Filled 2018-01-12 (×2): qty 1

## 2018-01-12 NOTE — ED Notes (Signed)
Spoke with [place] in main lab to inquire about las results as they were sent almost an hour ago. Per [place] she has them and "is receiving them right now."

## 2018-01-12 NOTE — ED Notes (Signed)
Patient transported to X-ray 

## 2018-01-12 NOTE — ED Triage Notes (Signed)
Patient arrived via EMS from home, c/o shob this morning. No c/o dizziness, chest pain, or any pain. Patient seen for the same in May, was prescribed nebulizer and albuterol inhaler-states it usually helps her but it did not work this time. Hx of COPD and stroke. AOX4.

## 2018-01-12 NOTE — ED Provider Notes (Signed)
[facility] EMERGENCY DEPARTMENT Provider Note   CSN: [id number] Arrival date & time: 01/12/18  1740  History   Chief Complaint Chief Complaint  Patient presents with  . Shortness of Breath   HPI  Patient is a 84 y.o. female with history of COPD, CVA, and anxiety presenting to the ED for dyspnea. Per patient, she had onset of dyspnea this morning which gradually worsened. It was worse with walking and relieved with rest. No chest pain, dizziness, vomiting, or syncope. Mild coughing when waking up this morning. No fever, URI symptoms, or other complaints. She used her inhaler at home without relief. She has been adherent to her COPD regimen and denies known trigger. She states she feels better while using oxygen during my evaluation. When I attempted to locate her dose of oxygen, her cannula was unplugged.  Past Medical History:  Diagnosis Date  . Anxiety   . Aortic arch atherosclerosis ([place]) 06/24/2014  . Atherosclerotic ulcer of aorta ([place]) 06/24/2014  . Carotid artery occlusion   . [place] DISEASE, LUMBAR 12/16/2008  . DIVERTICULOSIS, COLON 09/30/2008  . DYSPNEA 07/13/2008  . Graves disease   . History of embolic stroke 10/29/5359   Left brain  . HYPERLIPIDEMIA 03/06/2007  . HYPERTENSION 03/06/2007  . HYPOTHYROIDISM 10/13/2007  . OSTEOARTHRITIS 03/06/2007  . Personal history of colonic polyps 09/30/2008  . Stroke ([place])    06/2014           . TIA (transient ischemic attack)   . TOBACCO USE, QUIT 04/12/2009  . WEAKNESS 11/09/2007    Patient Active Problem List   Diagnosis Date Noted  . SOB (shortness of breath) 01/12/2018  . HLD (hyperlipidemia) 01/12/2018  . CKD (chronic kidney disease), stage III ([place]) 01/12/2018  . New onset left bundle branch block (LBBB) 01/12/2018  . COPD (chronic obstructive pulmonary disease) ([place]) 01/12/2018  . Left buttock pain 09/13/2017  . Symptomatic anemia   . AVM (arteriovenous malformation) of duodenum, acquired with hemorrhage   .  Acute GI bleeding 10/09/2016  . Other emphysema ([place]) 05/10/2016  . Recurrent laryngeal neuropathy 02/01/2016  . Nausea with vomiting 12/11/2015  . Left carotid stenosis 11/21/2015  . Asymptomatic carotid artery stenosis 11/13/2015  . Impaired glucose tolerance 10/27/2015  . Solitary pulmonary nodule 10/27/2015  . Thyroid nodule 10/27/2015  . Syncope 10/04/2015  . Bradycardia with less than 60 beats per minute 10/04/2015  . Paresthesia 10/03/2014  . History of embolic stroke 44/31/5400  . Paresthesia of both feet 07/06/2014  . Aortic arch atherosclerosis ([place]) 06/24/2014  . Atherosclerotic ulcer of aorta ([place]) 06/24/2014  . Stroke ([place]) 06/22/2014  . Stroke (cerebrum) ([place]) 06/22/2014  . Bilateral carotid artery disease ([place]) 05/18/2014  . TOBACCO USE, QUIT 04/12/2009  . [place] DISEASE, LUMBAR 12/16/2008  . DIVERTICULOSIS, COLON 09/30/2008  . Personal history of colonic polyps 09/30/2008  . DYSPNEA 07/13/2008  . Weakness 11/09/2007  . Hypothyroidism 10/13/2007  . Hyperlipidemia 03/06/2007  . Essential hypertension 03/06/2007  . Osteoarthritis 03/06/2007    Past Surgical History:  Procedure Laterality Date  . CARDIAC CATHETERIZATION    . CATARACT EXTRACTION Bilateral   . CHOLECYSTECTOMY    . ENDARTERECTOMY Left 11/13/2015   Procedure: LEFT CAROTID ARTERY ENDARTERECTOMY;  Surgeon: Conrad Franklin, MD;  Location: [facility];  Service: Vascular;  Laterality: Left;  . ESOPHAGOGASTRODUODENOSCOPY (EGD) WITH PROPOFOL N/A 10/11/2016   Procedure: ESOPHAGOGASTRODUODENOSCOPY (EGD) WITH PROPOFOL;  Surgeon: Mauri Pole, MD;  Location: [facility] ENDOSCOPY;  Service: Endoscopy;  Laterality: N/A;  .  KNEE SURGERY    . PATCH ANGIOPLASTY Left 11/13/2015   Procedure: WITH 1CM X 6CM  XENOSURE BIOLOGIC PATCH ANGIOPLASTY;  Surgeon: Conrad Forrest, MD;  Location: [facility];  Service: Vascular;  Laterality: Left;  . TEE WITHOUT CARDIOVERSION N/A 06/24/2014   Procedure: TRANSESOPHAGEAL ECHOCARDIOGRAM (TEE);  Surgeon:  Sanda Klein, MD;  Location: [facility] ENDOSCOPY;  Service: Cardiovascular;  Laterality: N/A;     OB History   None      Home Medications    Prior to Admission medications   Medication Sig Start Date End Date Taking? Authorizing Provider  albuterol (PROAIR HFA) 108 (90 Base) MCG/ACT inhaler Inhale 2 puffs into the lungs every 6 (six) hours as needed for wheezing or shortness of breath. 11/13/17  Yes Providence Lanius A, PA-C  albuterol (PROVENTIL) (2.5 MG/3ML) 0.083% nebulizer solution Take 3 mLs (2.5 mg total) by nebulization every 6 (six) hours as needed for wheezing or shortness of breath. 11/13/17  Yes Providence Lanius A, PA-C  amLODipine (NORVASC) 10 MG tablet TAKE 1 TABLET EVERY DAY 02/28/17  Yes Marletta Lor, MD  ANORO ELLIPTA 62.5-25 MCG/INH AEPB INHALE 1 PUFF INTO THE LUNGS EVERY DAY 10/06/17  Yes Mannam, Praveen, MD  aspirin EC 81 MG tablet Take 1 tablet (81 mg total) by mouth daily. 11/23/15  Yes Virgina Jock A, PA-C  atorvastatin (LIPITOR) 80 MG tablet TAKE 1 TABLET BY MOUTH EVERY DAY AT 6PM 08/05/17  Yes Marletta Lor, MD  fluticasone furoate-vilanterol (BREO ELLIPTA) 100-25 MCG/INH AEPB Inhale 1 puff into the lungs daily. 11/26/17  Yes Magdalen Spatz, NP  Glycerin-Hypromellose-PEG 400 ([employer] DRY EYE RELIEF) 0.2-0.2-1 % SOLN Place 1 drop into both eyes as needed.   Yes [provider]  hydrALAZINE (APRESOLINE) 25 MG tablet TAKE 3 TABLETS BY MOUTH 3 TIMES A DAY 11/17/17  Yes Josue Hector, MD  levothyroxine (SYNTHROID, LEVOTHROID) 75 MCG tablet TAKE 1 TABLET BY MOUTH EVERY DAY 10/20/17  Yes Marletta Lor, MD  Spacer/Aero-Holding Chambers (AEROCHAMBER MV) inhaler Use as instructed 11/26/17   Magdalen Spatz, NP    Family History Family History  Problem Relation Age of Onset  . Stomach cancer Maternal Grandmother   . Colon cancer Neg Hx     Social History Social History   Tobacco Use  . Smoking status: Former Smoker    Last attempt to quit: 07/08/1978     Years since quitting: 39.5  . Smokeless tobacco: Never Used  Substance Use Topics  . Alcohol use: Yes    Comment: "red wine"- some nights  . Drug use: No     Allergies   Catapres [clonidine hcl]; Lisinopril; and Labetalol hcl   Review of Systems Review of Systems  Constitutional: Negative for fever.  HENT: Negative for congestion and sore throat.   Eyes: Negative for visual disturbance.  Respiratory: Positive for shortness of breath.   Cardiovascular: Negative for chest pain.  Gastrointestinal: Negative for abdominal pain, diarrhea and vomiting.  Genitourinary: Negative for dysuria.  Musculoskeletal: Negative for neck pain.  Neurological: Negative for syncope.  All other systems reviewed and are negative.    Physical Exam Updated Vital Signs BP (!) 169/68 (BP Location: Right Arm)   Pulse 76   Temp 97.8 F (36.6 C) (Oral)   Resp 18   Ht 5\' 6"  (1.676 m)   Wt 62.4 kg (137 lb 9.1 oz)   SpO2 98%   BMI 22.20 kg/m   Physical Exam  Constitutional: She is oriented to  person, place, and time. No distress.  HENT:  Head: Normocephalic and atraumatic.  Mouth/Throat: Oropharynx is clear and moist.  Eyes: Pupils are equal, round, and reactive to light. Conjunctivae are normal.  Neck: Neck supple. No tracheal deviation present.  Cardiovascular: Normal rate, regular rhythm, normal heart sounds and intact distal pulses.  No murmur heard. Pulmonary/Chest: Effort normal and breath sounds normal. No stridor. No respiratory distress. She has no wheezes. She has no rales.  Abdominal: Soft. She exhibits no distension and no mass. There is no tenderness. There is no guarding.  Musculoskeletal: She exhibits no edema or deformity.  Neurological: She is alert and oriented to person, place, and time.  Skin: Skin is warm and dry.  Psychiatric: She has a normal mood and affect. Her behavior is normal.  Nursing note and vitals reviewed.    ED Treatments / Results  Labs (all labs ordered  are listed, but only abnormal results are displayed) Labs Reviewed  BASIC METABOLIC PANEL - Abnormal; Notable for the following components:      Result Value   Creatinine, Ser 1.26 (*)    GFR calc non Af Amer 36 (*)    GFR calc Af Amer 42 (*)    All other components within normal limits  I-STAT VENOUS BLOOD GAS, ED - Abnormal; Notable for the following components:   pH, Ven 7.444 (*)    pCO2, Ven 38.2 (*)    All other components within normal limits  CBC WITH DIFFERENTIAL/PLATELET  BRAIN NATRIURETIC PEPTIDE  HEMOGLOBIN A1C  LIPID PANEL  TROPONIN I  TROPONIN I  TROPONIN I  I-STAT TROPONIN, ED    EKG EKG Interpretation  Date/Time:  Monday January 12 2018 19:44:47 EDT Ventricular Rate:  79 PR Interval:  160 QRS Duration: 122 QT Interval:  434 QTC Calculation: 498 R Axis:   -61 Text Interpretation:  Sinus or ectopic atrial rhythm Left bundle branch block No STEMI.  Confirmed by Nanda Quinton [phone]) on 01/12/2018 7:49:50 PM   Radiology Dg Chest 2 View  Result Date: 01/12/2018 CLINICAL DATA:  Dyspnea EXAM: CHEST - 2 VIEW COMPARISON:  Chest CT 04/06/2003, CXR 11/12/2017 and dating back to 12/11/2015 FINDINGS: The heart size and mediastinal contours are within normal limits. Aortic atherosclerosis without aneurysm. Emphysematous hyperinflation of the lungs. Stable 7 mm opacity in the left mid lung dating back to 12/11/2015 without significant change. Degenerative change about both shoulders and dorsal spine. Cholecystectomy. IMPRESSION: Emphysematous hyperinflation of the lungs consistent with COPD. No active pulmonary disease. Stable 7 mm nodular opacity in the left mid lung dating back to 04/05/2016. Electronically Signed   By: Ashley Royalty M.D.   On: 01/12/2018 19:00    Procedures Procedures (including critical care time)  Medications Ordered in ED Medications  albuterol (PROVENTIL) (2.5 MG/3ML) 0.083% nebulizer solution 5 mg (5 mg Nebulization Not Given 01/12/18 1909)    ipratropium-albuterol (DUONEB) 0.5-2.5 (3) MG/3ML nebulizer solution 3 mL (3 mLs Nebulization Given 01/12/18 2313)  amLODipine (NORVASC) tablet 10 mg (has no administration in time range)  umeclidinium-vilanterol (ANORO ELLIPTA) 62.5-25 MCG/INH 1 puff (has no administration in time range)  atorvastatin (LIPITOR) tablet 80 mg (has no administration in time range)  aspirin EC tablet 325 mg (has no administration in time range)  hydrALAZINE (APRESOLINE) tablet 75 mg (has no administration in time range)  levothyroxine (SYNTHROID, LEVOTHROID) tablet 75 mcg (has no administration in time range)  albuterol (PROVENTIL) (2.5 MG/3ML) 0.083% nebulizer solution 2.5 mg (has no administration in time range)  dextromethorphan-guaiFENesin (MUCINEX DM) 30-600 MG per 12 hr tablet 1 tablet (has no administration in time range)  nitroGLYCERIN (NITROSTAT) SL tablet 0.4 mg (has no administration in time range)  morphine 4 MG/ML injection 0.52 mg (has no administration in time range)  acetaminophen (TYLENOL) tablet 650 mg (has no administration in time range)  ondansetron (ZOFRAN) injection 4 mg (has no administration in time range)  enoxaparin (LOVENOX) injection 40 mg (has no administration in time range)  zolpidem (AMBIEN) tablet 5 mg (has no administration in time range)  hydrALAZINE (APRESOLINE) injection 5 mg (has no administration in time range)  pantoprazole (PROTONIX) EC tablet 40 mg (has no administration in time range)  hydroxypropyl methylcellulose / hypromellose (ISOPTO TEARS / GONIOVISC) 2.5 % ophthalmic solution 1 drop (has no administration in time range)  ipratropium-albuterol (DUONEB) 0.5-2.5 (3) MG/3ML nebulizer solution 3 mL (3 mLs Nebulization Given 01/12/18 1909)  aspirin chewable tablet 324 mg (324 mg Oral Given 01/12/18 2215)     Initial Impression / Assessment and Plan / ED Course  I have reviewed the triage vital signs and the nursing notes.  Pertinent labs & imaging results that were  available during my care of the patient were reviewed by me and considered in my medical decision making (see chart for details).  This is a 84 year old female presenting for dyspnea. Patient given bronchodilators with improvement. VBG and screening labs reassuring. No pneumonia or PTX on CXR.  Dyspnea out of proportion to lung exam, prompting cardiac workup. While in the ED, she had episode of wide-complex beats captured on EKG found to be LBBB. She has had borderline QRS with LVH previously but no definitive LBBB. Initial troponin negative. Patient reassessed, confirmed no recent CP. Patient given ASA and admitted to hospitalist for further management. EKG reviewed with on-call cardiologist at hospitalist request who recommended serial biomarkers without other intervention at this time. Admitted in stable condition.  Final Clinical Impressions(s) / ED Diagnoses   Final diagnoses:  COPD exacerbation ([place])  LBBB (left bundle branch block)     Prescilla Sours, MD 01/12/18 2345    Margette Fast, MD 01/13/18 1109

## 2018-01-12 NOTE — H&P (Addendum)
History and Physical    [name] CVE:[id number] DOB: May 21, 1925 DOA: 01/12/2018  Referring MD/NP/PA:   PCP: Marletta Lor, MD   Patient coming from:  The patient is coming from home.  At baseline, pt is independent for most of ADL.   Chief Complaint: SOB  HPI: [name] is a 84 y.o. female with medical history significant of hypertension, hyperlipidemia, COPD, stroke, hypothyroidism, anxiety, Graves' disease, CKD-III, former smoker, upper GI bleeding, who presents with shortness of breath.  Patient states that she has been having shortness of breath for about 1 week, which has worsened today.  She uses inhalers at home, which is not helpful anymore. Patient has mild dry cough, but no chest pain, fever or chills.  Denies recent long distance traveling.  No tenderness in the calf areas.  Patient does not nausea, vomiting, diarrhea, abdominal pain, symptoms of UTI.  No unilateral weakness.  Patient is living a lone at home, being active, driving car by herself, going shopping by herself.  ED Course: pt was found to have negative troponin, WBC 4.8, stable renal function, temperature normal, no tachycardia, oxygen saturation 95% on room air, chest x-ray showed COPD without infiltration.  Patient is placed on telemetry bed for observation.  Review of Systems:   General: no fevers, chills, no body weight gain, has poor appetite, has fatigue HEENT: no blurry vision, hearing changes or sore throat Respiratory: has dyspnea, coughing, no wheezing CV: no chest pain, no palpitations GI: no nausea, vomiting, abdominal pain, diarrhea, constipation GU: no dysuria, burning on urination, increased urinary frequency, hematuria  Ext: no leg edema Neuro: no unilateral weakness, numbness, or tingling, no vision change or hearing loss Skin: no rash, no skin tear. MSK: No muscle spasm, no deformity, no limitation of range of movement in spin Heme: No easy bruising.  Travel history: No recent  long distant travel.  Allergy:  Allergies  Allergen Reactions  . Catapres [Clonidine Hcl] Other (See Comments)    Made pt feel horrible, shaky, weak and nausea  . Lisinopril Anaphylaxis    Tongue swelling  . Labetalol Hcl Other (See Comments)    Caused bradycardia and syncope    Past Medical History:  Diagnosis Date  . Anxiety   . Aortic arch atherosclerosis ([place]) 06/24/2014  . Atherosclerotic ulcer of aorta ([place]) 06/24/2014  . Carotid artery occlusion   . [place] DISEASE, LUMBAR 12/16/2008  . DIVERTICULOSIS, COLON 09/30/2008  . DYSPNEA 07/13/2008  . Graves disease   . History of embolic stroke 0/25/8527   Left brain  . HYPERLIPIDEMIA 03/06/2007  . HYPERTENSION 03/06/2007  . HYPOTHYROIDISM 10/13/2007  . OSTEOARTHRITIS 03/06/2007  . Personal history of colonic polyps 09/30/2008  . Stroke ([place])    06/2014           . TIA (transient ischemic attack)   . TOBACCO USE, QUIT 04/12/2009  . WEAKNESS 11/09/2007    Past Surgical History:  Procedure Laterality Date  . CARDIAC CATHETERIZATION    . CATARACT EXTRACTION Bilateral   . CHOLECYSTECTOMY    . ENDARTERECTOMY Left 11/13/2015   Procedure: LEFT CAROTID ARTERY ENDARTERECTOMY;  Surgeon: Conrad Ocotillo, MD;  Location: [facility];  Service: Vascular;  Laterality: Left;  . ESOPHAGOGASTRODUODENOSCOPY (EGD) WITH PROPOFOL N/A 10/11/2016   Procedure: ESOPHAGOGASTRODUODENOSCOPY (EGD) WITH PROPOFOL;  Surgeon: Mauri Pole, MD;  Location: [facility];  Service: Endoscopy;  Laterality: N/A;  . KNEE SURGERY    . PATCH ANGIOPLASTY Left 11/13/2015   Procedure: WITH 1CM X  6CM  XENOSURE BIOLOGIC PATCH ANGIOPLASTY;  Surgeon: Conrad Holly Pond, MD;  Location: [facility];  Service: Vascular;  Laterality: Left;  . TEE WITHOUT CARDIOVERSION N/A 06/24/2014   Procedure: TRANSESOPHAGEAL ECHOCARDIOGRAM (TEE);  Surgeon: Sanda Klein, MD;  Location: [facility] ENDOSCOPY;  Service: Cardiovascular;  Laterality: N/A;    Social History:  reports that she quit smoking about 39 years ago. She  has never used smokeless tobacco. She reports that she drinks alcohol. She reports that she does not use drugs.  Family History:  Family History  Problem Relation Age of Onset  . Stomach cancer Maternal Grandmother   . Colon cancer Neg Hx      Prior to Admission medications   Medication Sig Start Date End Date Taking? Authorizing Provider  albuterol (PROAIR HFA) 108 (90 Base) MCG/ACT inhaler Inhale 2 puffs into the lungs every 6 (six) hours as needed for wheezing or shortness of breath. 11/13/17  Yes Providence Lanius A, PA-C  albuterol (PROVENTIL) (2.5 MG/3ML) 0.083% nebulizer solution Take 3 mLs (2.5 mg total) by nebulization every 6 (six) hours as needed for wheezing or shortness of breath. 11/13/17  Yes Providence Lanius A, PA-C  amLODipine (NORVASC) 10 MG tablet TAKE 1 TABLET EVERY DAY 02/28/17  Yes Marletta Lor, MD  ANORO ELLIPTA 62.5-25 MCG/INH AEPB INHALE 1 PUFF INTO THE LUNGS EVERY DAY 10/06/17  Yes Mannam, Praveen, MD  aspirin EC 81 MG tablet Take 1 tablet (81 mg total) by mouth daily. 11/23/15  Yes Virgina Jock A, PA-C  atorvastatin (LIPITOR) 80 MG tablet TAKE 1 TABLET BY MOUTH EVERY DAY AT 6PM 08/05/17  Yes Marletta Lor, MD  fluticasone furoate-vilanterol (BREO ELLIPTA) 100-25 MCG/INH AEPB Inhale 1 puff into the lungs daily. 11/26/17  Yes Magdalen Spatz, NP  Glycerin-Hypromellose-PEG 400 ([employer] DRY EYE RELIEF) 0.2-0.2-1 % SOLN Place 1 drop into both eyes as needed.   Yes [provider]  hydrALAZINE (APRESOLINE) 25 MG tablet TAKE 3 TABLETS BY MOUTH 3 TIMES A DAY 11/17/17  Yes Josue Hector, MD  levothyroxine (SYNTHROID, LEVOTHROID) 75 MCG tablet TAKE 1 TABLET BY MOUTH EVERY DAY 10/20/17  Yes Marletta Lor, MD  Spacer/Aero-Holding Chambers (AEROCHAMBER MV) inhaler Use as instructed 11/26/17   Magdalen Spatz, NP    Physical Exam: Vitals:   01/12/18 2100 01/12/18 2115 01/12/18 2130 01/12/18 2145  BP: (!) 156/68 (!) 173/64 (!) 170/93 (!) 151/63  Pulse: 79 87 80  76  Resp:  18  19  Temp:      TempSrc:      SpO2: 96% 95% 96% 94%  Weight:      Height:       General: Not in acute distress HEENT:       Eyes: PERRL, EOMI, no scleral icterus.       ENT: No discharge from the ears and nose, no pharynx injection, no tonsillar enlargement.        Neck: No JVD, no bruit, no mass felt. Heme: No neck lymph node enlargement. Cardiac: T2/I7, RRR, 2/6 systolic murmurs, No gallops or rubs. Respiratory: No rales, wheezing, rhonchi or rubs. GI: Soft, nondistended, nontender, no rebound pain, no organomegaly, BS present. GU: No hematuria Ext: No pitting leg edema bilaterally. 2+DP/PT pulse bilaterally. Musculoskeletal: No joint deformities, No joint redness or warmth, no limitation of ROM in spin. Skin: No rashes.  Neuro: Alert, oriented X3, cranial nerves II-XII grossly intact, moves all extremities normally Psych: Patient is not psychotic, no suicidal or hemocidal ideation.  Labs on Admission: I have personally reviewed following labs and imaging studies  CBC: Recent Labs  Lab 01/12/18 1916  WBC 4.8  NEUTROABS 3.0  HGB 13.1  HCT 41.5  MCV 98.3  PLT 308   Basic Metabolic Panel: Recent Labs  Lab 01/12/18 1916  NA 139  K 3.6  CL 107  CO2 23  GLUCOSE 98  BUN 16  CREATININE 1.26*  CALCIUM 8.9   GFR: Estimated Creatinine Clearance: 27.8 mL/min (A) (by C-G formula based on SCr of 1.26 mg/dL (H)). Liver Function Tests: No results for input(s): AST, ALT, ALKPHOS, BILITOT, PROT, ALBUMIN in the last 168 hours. No results for input(s): LIPASE, AMYLASE in the last 168 hours. No results for input(s): AMMONIA in the last 168 hours. Coagulation Profile: No results for input(s): INR, PROTIME in the last 168 hours. Cardiac Enzymes: No results for input(s): CKTOTAL, CKMB, CKMBINDEX, TROPONINI in the last 168 hours. BNP (last 3 results) No results for input(s): PROBNP in the last 8760 hours. HbA1C: No results for input(s): HGBA1C in the last 72  hours. CBG: No results for input(s): GLUCAP in the last 168 hours. Lipid Profile: No results for input(s): CHOL, HDL, LDLCALC, TRIG, CHOLHDL, LDLDIRECT in the last 72 hours. Thyroid Function Tests: No results for input(s): TSH, T4TOTAL, FREET4, T3FREE, THYROIDAB in the last 72 hours. Anemia Panel: No results for input(s): VITAMINB12, FOLATE, FERRITIN, TIBC, IRON, RETICCTPCT in the last 72 hours. Urine analysis:    Component Value Date/Time   COLORURINE YELLOW 12/11/2015 1216   APPEARANCEUR CLEAR 12/11/2015 1216   LABSPEC 1.010 12/11/2015 1216   PHURINE 6.5 12/11/2015 1216   GLUCOSEU NEGATIVE 12/11/2015 1216   HGBUR NEGATIVE 12/11/2015 1216   BILIRUBINUR NEGATIVE 12/11/2015 1216   BILIRUBINUR n 08/17/2012 1455   KETONESUR NEGATIVE 12/11/2015 1216   PROTEINUR NEGATIVE 11/21/2015 1325   UROBILINOGEN 0.2 12/11/2015 1216   NITRITE NEGATIVE 12/11/2015 1216   LEUKOCYTESUR TRACE (A) 12/11/2015 1216   Sepsis Labs: @LABRCNTIP (procalcitonin:4,lacticidven:4) )No results found for this or any previous visit (from the past 240 hour(s)).   Radiological Exams on Admission: Dg Chest 2 View  Result Date: 01/12/2018 CLINICAL DATA:  Dyspnea EXAM: CHEST - 2 VIEW COMPARISON:  Chest CT 04/06/2003, CXR 11/12/2017 and dating back to 12/11/2015 FINDINGS: The heart size and mediastinal contours are within normal limits. Aortic atherosclerosis without aneurysm. Emphysematous hyperinflation of the lungs. Stable 7 mm opacity in the left mid lung dating back to 12/11/2015 without significant change. Degenerative change about both shoulders and dorsal spine. Cholecystectomy. IMPRESSION: Emphysematous hyperinflation of the lungs consistent with COPD. No active pulmonary disease. Stable 7 mm nodular opacity in the left mid lung dating back to 04/05/2016. Electronically Signed   By: Ashley Royalty M.D.   On: 01/12/2018 19:00     EKG: Independently reviewed.  Sinus rhythm, QTC 458, anteroseptal infarction pattern. The  repeated EKG showed new left bundle blockage (Patient had history of incomplete left bundle blockade in the past).  Assessment/Plan Principal Problem:   SOB (shortness of breath) Active Problems:   Hypothyroidism   Essential hypertension   Stroke (cerebrum) (HCC)   HLD (hyperlipidemia)   CKD (chronic kidney disease), stage III (HCC)   New onset left bundle branch block (LBBB)   COPD (chronic obstructive pulmonary disease) (HCC)   SOB (shortness of breath): Etiology is not clear.  Patient has history of COPD, but no wheezing or rhonchi on auscultation.  Patient does not have productive cough, does not seem to have COPD exacerbation.  Patient denies any chest pain, no oxygen desaturation, no recent long distance traveling, no signs of DVT.  Patient has been being active, low suspicion for PE.  EKG showed new left bundle blockage (patient had history of incomplete left bundle blockade), will need to r/o ACS. Per EDP, "EKG reviewed with on-call cardiologist at hospitalist request who recommended serial biomarkers without other intervention at this time". - will place on tele bed for obs - contine bronchodilator treatment for COPD - r/o ACS. - cycle CE q6 x3 and repeat EKG in the am  - prn Nitroglycerin, Morphine, and lipitor  - increased ASA dose from 81 to 325 mg daily-->add protonix orally due to hx of GIB - Risk factor stratification: will check FLP and A1C  - 2d echo  COPD:  -Bronchodilators and as needed Mucinex for cough  New onset left bundle branch block: -see above for ACS r/o work up  Hypothyroidism: Last TSH was 2.69 on 10/09/16 -Continue home Synthroid  HTN:  -Continue home medications: Amlodipine, oral hydralazine -IV hydralazine prn  Stroke (cerebrum) ([place]): -ASA and lipitor  HLD: -lipitor  CKD (chronic kidney disease), stage III ([place]): Baseline creatinine 1.1-1.3.  Her creatinine is 1.26, BUN 16. Stable.  -f/u renal Fx by BMP   DVT ppx:  SQ Lovenox Code  Status: Full code Family Communication: None at bed side.    Disposition Plan:  Anticipate discharge back to previous home environment Consults called:   Card has reviewed EKG per EDP Admission status: Obs / tele    Date of Service 01/12/2018    Ivor Costa [facility] Pager [phone]  If 7PM-7AM, please contact night-coverage [email or web address] Password [facility] 01/12/2018, 10:25 PM

## 2018-01-12 NOTE — ED Notes (Signed)
Attempted report, left call back number with unit secretary.  

## 2018-01-13 ENCOUNTER — Ambulatory Visit: Payer: Medicare Other | Admitting: Pulmonary Disease

## 2018-01-13 ENCOUNTER — Observation Stay (HOSPITAL_BASED_OUTPATIENT_CLINIC_OR_DEPARTMENT_OTHER): Payer: Medicare Other

## 2018-01-13 ENCOUNTER — Telehealth: Payer: Self-pay | Admitting: Pulmonary Disease

## 2018-01-13 DIAGNOSIS — N183 Chronic kidney disease, stage 3 (moderate): Secondary | ICD-10-CM | POA: Diagnosis not present

## 2018-01-13 DIAGNOSIS — I10 Essential (primary) hypertension: Secondary | ICD-10-CM | POA: Diagnosis not present

## 2018-01-13 DIAGNOSIS — I129 Hypertensive chronic kidney disease with stage 1 through stage 4 chronic kidney disease, or unspecified chronic kidney disease: Secondary | ICD-10-CM | POA: Diagnosis not present

## 2018-01-13 DIAGNOSIS — I060 Rheumatic aortic stenosis: Secondary | ICD-10-CM | POA: Diagnosis not present

## 2018-01-13 DIAGNOSIS — J449 Chronic obstructive pulmonary disease, unspecified: Secondary | ICD-10-CM | POA: Diagnosis not present

## 2018-01-13 DIAGNOSIS — R0602 Shortness of breath: Secondary | ICD-10-CM | POA: Diagnosis not present

## 2018-01-13 LAB — BASIC METABOLIC PANEL
Anion gap: 10 (ref 5–15)
BUN: 14 mg/dL (ref 8–23)
CHLORIDE: 106 mmol/L (ref 98–111)
CO2: 24 mmol/L (ref 22–32)
Calcium: 8.6 mg/dL — ABNORMAL LOW (ref 8.9–10.3)
Creatinine, Ser: 1.31 mg/dL — ABNORMAL HIGH (ref 0.44–1.00)
GFR calc non Af Amer: 35 mL/min — ABNORMAL LOW (ref >60)
GFR, EST AFRICAN AMERICAN: 40 mL/min — AB (ref >60)
Glucose, Bld: 144 mg/dL — ABNORMAL HIGH (ref 70–99)
POTASSIUM: 3.7 mmol/L (ref 3.5–5.1)
SODIUM: 140 mmol/L (ref 135–145)

## 2018-01-13 LAB — HEMOGLOBIN A1C
HEMOGLOBIN A1C: 5.7 % — AB (ref 4.8–5.6)
Mean Plasma Glucose: 116.89 mg/dL

## 2018-01-13 LAB — LIPID PANEL
CHOLESTEROL: 148 mg/dL (ref 0–200)
HDL: 76 mg/dL (ref >40)
LDL Cholesterol: 66 mg/dL (ref 0–99)
Total CHOL/HDL Ratio: 1.9 RATIO
Triglycerides: 31 mg/dL (ref <150)
VLDL: 6 mg/dL (ref 0–40)

## 2018-01-13 LAB — TROPONIN I

## 2018-01-13 LAB — ECHOCARDIOGRAM COMPLETE
HEIGHTINCHES: 66 in
WEIGHTICAEL: 2176 [oz_av]

## 2018-01-13 LAB — BRAIN NATRIURETIC PEPTIDE: B NATRIURETIC PEPTIDE 5: 105 pg/mL — AB (ref 0.0–100.0)

## 2018-01-13 MED ORDER — ENOXAPARIN SODIUM 30 MG/0.3ML ~~LOC~~ SOLN
30.0000 mg | SUBCUTANEOUS | Status: DC
  Administered 2018-01-14: 30 mg via SUBCUTANEOUS
  Filled 2018-01-13: qty 0.3

## 2018-01-13 MED ORDER — FLUTICASONE FUROATE-VILANTEROL 100-25 MCG/INH IN AEPB: 1.0000 | INHALATION_SPRAY | Freq: Every day | RESPIRATORY_TRACT | 5 refills | Status: DC

## 2018-01-13 MED ORDER — BUDESONIDE 0.5 MG/2ML IN SUSP
0.5000 mg | Freq: Two times a day (BID) | RESPIRATORY_TRACT | Status: DC
  Administered 2018-01-13 – 2018-01-14 (×2): 0.5 mg via RESPIRATORY_TRACT
  Filled 2018-01-13 (×2): qty 2

## 2018-01-13 MED ORDER — IPRATROPIUM-ALBUTEROL 0.5-2.5 (3) MG/3ML IN SOLN
3.0000 mL | Freq: Three times a day (TID) | RESPIRATORY_TRACT | Status: DC
  Administered 2018-01-13: 3 mL via RESPIRATORY_TRACT
  Filled 2018-01-13: qty 3

## 2018-01-13 NOTE — Progress Notes (Signed)
Pt expressing concern that she has not eaten in several hours  Educated pt on abnormal EKG and shortness of breath  Pt verbalizes understanding for possible further work up  Informed MD Arrien, MD aware, no new orders at this time  Will continue to monitor

## 2018-01-13 NOTE — Telephone Encounter (Signed)
Patient was last seen 5.22.19 by Judson Roch NP: Patient Instructions  It is nice to meet you today. We will try Breo in place of Anoro x 2 weeks. This is called a therapeutic trial. Try the Breo x 2 weeks  Rinse mouth after use If you like the Breo, let us know and we will send in a prescription ( Call us) If you do not like the Breo,  You  can  Go back to the Anoro. We will prescribe a spacer for use with your rescue medication. Use rescue as needed for breakthrough shortness of breath up to 4 times a day. Use incentive spirometer and flutter valve 3 times/ day as Dr. Vaughan Browner prescribed.. Albuterol nebs as needed for breakthrough shortness of breath ( If you are not using rescue inhaler) Follow up with Dr. Vaughan Browner or Judson Roch in 2 months. Please contact office for sooner follow up if symptoms do not improve or worsen or seek emergency care    May need follow up PFT's if flares become more frequent.   Patient is now admitted as of yesterday 7.8.19 for COPD exacerbation and daughter is requesting sample of the Breo that was begun at that office visit.  However, while hospitalized pt has been given Anoro.  Called spoke with patient's daughter Keane Police to discuss.  Per Keane Police, pt took the [place] given by Judson Roch NP, then when finished she went back on the Anoro.  Per Keane Police, patient prefers the [place].  Sarah please advise, if okay to switch back to the [facility] once patient is discharged.  Thank you.

## 2018-01-13 NOTE — Telephone Encounter (Signed)
Called and spoke with pt's daughter Keane Police letting her know that SG stated it was fine for pt to go back on Breo and that it was requested for an Rx to be sent in to pt's pharmacy for her.  Lorrie expressed understanding. Verified pt's pharmacy and sent script in for pt.  Nothing further needed.

## 2018-01-13 NOTE — Progress Notes (Signed)
PROGRESS NOTE    [name]  NAT:[id number] DOB: 21-Dec-1924 DOA: 01/12/2018 PCP: Marletta Lor, MD    Brief Narrative:  84 year old female who presented with dyspnea.  She does have the significant past medical history for hypertension, dyslipidemia, COPD, hypothyroidism, Graves' disease, chronic kidney disease stage III.  Reported 7 days of progressive and worsening dyspnea, associated with mild dry cough, no wheezing, no PND or orthopnea but positive lower extremity edema.  On the initial physical examination blood pressure 156/68, heart rate 79, respiratory rate 18, oxygen saturation 95%.  Moist mucous membranes, lungs clear to auscultation bilaterally, heart S1-S2 present rhythmic, positive 3 out of 6 systolic murmur at the base, radiated to the carotids, lungs clear to auscultation bilaterally, abdomen soft nontender, no lower extremity edema.  139, potassium 3.6, chloride 107, bicarb 23, glucose 98, BUN 16, creatinine 1.26, white count 4.8, hemoglobin 13.1, hematocrit 41.5, platelets 167, chest x-ray with hyperinflation, no infiltrates.  EKG with sinus rhythm, left axis deviation, left bundle branch block (chronic changes).  Patient was admitted to the hospital working diagnosis of multifactorial dyspnea.   Assessment & Plan:   Principal Problem:   SOB (shortness of breath) Active Problems:   Hypothyroidism   Essential hypertension   Stroke (cerebrum) (HCC)   HLD (hyperlipidemia)   CKD (chronic kidney disease), stage III (HCC)   New onset left bundle branch block (LBBB)   COPD (chronic obstructive pulmonary disease) ([place])  1. Multifactorial dyspnea in the setting of COPD. Symptoms have improved since admission with use of bronchodilators, possible asthma component to her COPD, will continue albuterol/ anoro, and will add inhaled steroid. Old records personally reviewed with PFT on 2017 with FeV1/FVC 56 with FeV1 82%, with high total lung volumes and low DLCO 42%. Currently  patient not wheezing, will repeat full PFT. Follow on repeat echocardiogram.  Patient had recently worked up for PE with VQ scan which was low probability for PE.   2. HTN. Continue blood pressure control with amlodipine, hydralazine.  3. Hypothyroid. Will continue levorthyroxine.   4. Dyslipidemia. Continue statin therapy.   5. CKD stage 3. Stable renal function with serum cr at 1,26 with K at 3,6 and serum bicarbonate 23. Will follow on renal panel in am, avoid hypotension or nephrotoxic medications.   DVT prophylaxis: enoxaparin    Code Status: full Family Communication: I spoke with patient's daughter at the bedside and all questions were addressed.  Disposition Plan/ discharge barriers: Discharge home when completed pulmonary work up.    Consultants:     Procedures:     Antimicrobials:       Subjective: Patient is feeling better, dyspnea has improved but is not back to baseline, no nausea or vomiting.   Objective: Vitals:   01/13/18 0753 01/13/18 0754 01/13/18 0825 01/13/18 1134  BP:   140/61 (!) 147/53  Pulse:   75 69  Resp:      Temp:   97.8 F (36.6 C) 98.3 F (36.8 C)  TempSrc:   Oral Oral  SpO2: 97% 100% 99% 98%  Weight:      Height:        Intake/Output Summary (Last 24 hours) at 01/13/2018 1522 Last data filed at 01/13/2018 1340 Gross per 24 hour  Intake 240 ml  Output 450 ml  Net -210 ml   Filed Weights   01/12/18 1753 01/12/18 2305 01/13/18 0001  Weight: 61.7 kg (136 lb) 62.4 kg (137 lb 9.1 oz) 61.7 kg (136 lb)  Examination:   General: Not in pain or dyspnea, deconditioned Neurology: Awake and alert, non focal  E ENT: mild pallor, no icterus, oral mucosa moist Cardiovascular: No JVD. S1-S2 present, rhythmic, no gallops, rubs. Positive systolic murmur 3/6 radiated to the carotids. No lower extremity edema. Pulmonary: positive breath sounds bilaterally, decreased air movement, no wheezing, rhonchi or rales. Gastrointestinal. Abdomen with  no organomegaly, non tender, no rebound or guarding Skin. No rashes Musculoskeletal: no joint deformities     Data Reviewed: I have personally reviewed following labs and imaging studies  CBC: Recent Labs  Lab 01/12/18 1916  WBC 4.8  NEUTROABS 3.0  HGB 13.1  HCT 41.5  MCV 98.3  PLT 263   Basic Metabolic Panel: Recent Labs  Lab 01/12/18 1916  NA 139  K 3.6  CL 107  CO2 23  GLUCOSE 98  BUN 16  CREATININE 1.26*  CALCIUM 8.9   GFR: Estimated Creatinine Clearance: 27.8 mL/min (A) (by C-G formula based on SCr of 1.26 mg/dL (H)). Liver Function Tests: No results for input(s): AST, ALT, ALKPHOS, BILITOT, PROT, ALBUMIN in the last 168 hours. No results for input(s): LIPASE, AMYLASE in the last 168 hours. No results for input(s): AMMONIA in the last 168 hours. Coagulation Profile: No results for input(s): INR, PROTIME in the last 168 hours. Cardiac Enzymes: Recent Labs  Lab 01/13/18 0021 01/13/18 0630 01/13/18 0936  TROPONINI <0.03 <0.03 <0.03   BNP (last 3 results) No results for input(s): PROBNP in the last 8760 hours. HbA1C: Recent Labs    01/13/18 0027  HGBA1C 5.7*   CBG: No results for input(s): GLUCAP in the last 168 hours. Lipid Profile: Recent Labs    01/13/18 0027  CHOL 148  HDL 76  LDLCALC 66  TRIG 31  CHOLHDL 1.9   Thyroid Function Tests: No results for input(s): TSH, T4TOTAL, FREET4, T3FREE, THYROIDAB in the last 72 hours. Anemia Panel: No results for input(s): VITAMINB12, FOLATE, FERRITIN, TIBC, IRON, RETICCTPCT in the last 72 hours.    Radiology Studies: I have reviewed all of the imaging during this hospital visit personally     Scheduled Meds: . albuterol  5 mg Nebulization Once  . amLODipine  10 mg Oral Daily  . aspirin EC  325 mg Oral Daily  . atorvastatin  80 mg Oral q1800  . [START ON 01/14/2018] enoxaparin (LOVENOX) injection  30 mg Subcutaneous Q24H  . hydrALAZINE  75 mg Oral Q8H  . levothyroxine  75 mcg Oral QAC  breakfast  . pantoprazole  40 mg Oral Q1200  . umeclidinium-vilanterol  1 puff Inhalation Daily   Continuous Infusions:   LOS: 0 days        Gloria Newsham Gerome Apley, MD [facility] Pager [phone]

## 2018-01-13 NOTE — Progress Notes (Signed)
PROGRESS NOTE  [name] SEG:[id number] DOB: [birth year] DOA: 01/12/2018 PCP: Marletta Lor, MD   Brief Narrative:  [name] is a 84 y.o. female with who presented to the ED on 01/12/18 complaining of 1 week of worsening exertional dyspnea that progresses significantly yesterday. She has a significant past medical history of COPD, hypertension, hyperlipidemia, history of CVA, graves disease, CKDIII, and an upper GI bleed. Patient is normally quite active - going to the store, doing stairs daily. She attempted to use her albuterol inhaler with no relief. She has a 40 year pack history.  She was recently see in the ER in May for a COPD exacerbation. VQ scan was negative for any acute abnormality, no evidence of PE. Echo from 5/18 showed LV EF 55-60%.  In the ED, her vitals were BP 169/68, HR 76, Temp 97.8, RR 18, SpO2 98%. She was complaining of SOB, a mild cough, and fatigue. She was found to have no rales, wheezing or rhonchi and a 2/6 systolic murmer. Workup showed negative troponin, and a sCr of 1.56, VBG showed pH of 7.44 and pCO2 of 38.2. CXR showed stable emphysema with no infiltrates. EKG showed a new LBBB. Cardiology recommended serial biomarkers to rule out ACS origin - she was placed on a telemetry bed for observation and workup of SOB of unknown etiology.  Assessment & Plan:  1. Exertional Dyspnea / SOB - Etiology unknown: history of COPD, but no wheezing, rhonchi, or increasing/changing cough. Low suspicion of PE.  - Cariology recommended serial biomarkers: all negative to date.  - Await Echo results - Follow with PT   2. New LBBB  - ED EKG showed new LBBB (patient has history of incomplete BBB) - EDG 7/9 morning showed resolution.   3. COPD - Appears stable. Continue Anora daily and nebulizer treatments prn.  4. Hypothyroidism  - Continue synthroid - last TSH check 10/09/16 (2.69)  5. HTN  - Continue home meds: amlodipine, PO hydralazine. IV hydralazine PRN  6.  History of CVA  - Continue Asa and Atorvastatin  7. HLD  - Continue Atorvastatin  8. CKD III - Stable. Baseline creatinine 1.1-1.3. Yesterday it was 1.26 and BUN 16. Continue to monitor with BMP  DVT prophylaxis: SQ Lovenox Code Status: FULL Family Communication: Daughter at bedside MDM and disposition Plan: The below labs and imaging reports were reviewed and summarized above.    Consultants:   Cardiology   Procedures:   Complete Echo  Antimicrobials:   None   Subjective: The patient's symptoms are reportedly the same since last night. She had SOB when transferring from the bed to chair this morning. She feels that her nebulizer treatment this morning did not help. No CP, wheezing, productive cough, or LE edema. She reports that yesterday she did have some LE edema when she was SOB but has this intermittently, but it resolves spontaneously. She 99% is on 2L [place] O2.  Objective: Vitals:   01/13/18 0721 01/13/18 0753 01/13/18 0754 01/13/18 0825  BP:    140/61  Pulse:    75  Resp:      Temp:    97.8 F (36.6 C)  TempSrc:    Oral  SpO2: 97% 97% 100% 99%  Weight:      Height:        Intake/Output Summary (Last 24 hours) at 01/13/2018 1020 Last data filed at 01/13/2018 0759 Gross per 24 hour  Intake -  Output 450 ml  Net -450 ml  Filed Weights   01/12/18 1753 01/12/18 2305 01/13/18 0001  Weight: 61.7 kg (136 lb) 62.4 kg (137 lb 9.1 oz) 61.7 kg (136 lb)   Examination: General appearance: Adult pleasant female, alert and in no distress.   Neuro: Awake and alert. Moves all extremities. Speech fluent.   HEENT: Anicteric, conjunctiva pink, lids and lashes normal. No nasal deformity, discharge, epistaxis.  Skin: Warm and dry. No suspicious rashes or lesions. Cardiac: RRR, Systolic murmer present. No JVD. No LE edema. Radial pulses 2+ and symmetric. Distant pulses 2+ bilaterally. Respiratory: Normal respiratory rate and rhythm.  Decreased breath sounds throughout. No rales,  wheezes or rhonchi. Abdomen: Abdomen soft. No TTP. No ascites or distension MSK: No deformities or effusions. Psych: Sensorium intact and responding to questions, attention normal. Judgment and insight appear normal.  Data Reviewed: I have personally reviewed following labs and imaging studies:  CBC: Recent Labs  Lab 01/12/18 1916  WBC 4.8  NEUTROABS 3.0  HGB 13.1  HCT 41.5  MCV 98.3  PLT 485   Basic Metabolic Panel: Recent Labs  Lab 01/12/18 1916  NA 139  K 3.6  CL 107  CO2 23  GLUCOSE 98  BUN 16  CREATININE 1.26*  CALCIUM 8.9   GFR: Estimated Creatinine Clearance: 27.8 mL/min (A) (by C-G formula based on SCr of 1.26 mg/dL (H)).  Cardiac Enzymes: Recent Labs  Lab 01/13/18 0021 01/13/18 0630  TROPONINI <0.03 <0.03   BNP    Component Value Date/Time   BNP 105.0 (H) 01/13/2018 0021   HbA1C: Recent Labs    01/13/18 0027  HGBA1C 5.7*   Lipid Profile: Recent Labs    01/13/18 0027  CHOL 148  HDL 76  LDLCALC 66  TRIG 31  CHOLHDL 1.9   Urine analysis:    Component Value Date/Time   COLORURINE YELLOW 12/11/2015 [place] 12/11/2015 1216   LABSPEC 1.010 12/11/2015 1216   PHURINE 6.5 12/11/2015 1216   GLUCOSEU NEGATIVE 12/11/2015 1216   HGBUR NEGATIVE 12/11/2015 1216   BILIRUBINUR NEGATIVE 12/11/2015 1216   BILIRUBINUR n 08/17/2012 1455   KETONESUR NEGATIVE 12/11/2015 1216   PROTEINUR NEGATIVE 11/21/2015 1325   UROBILINOGEN 0.2 12/11/2015 1216   NITRITE NEGATIVE 12/11/2015 1216   LEUKOCYTESUR TRACE (A) 12/11/2015 1216   Radiology Studies: Dg Chest 2 View  Result Date: 01/12/2018 CLINICAL DATA:  Dyspnea EXAM: CHEST - 2 VIEW COMPARISON:  Chest CT 04/06/2003, CXR 11/12/2017 and dating back to 12/11/2015 FINDINGS: The heart size and mediastinal contours are within normal limits. Aortic atherosclerosis without aneurysm. Emphysematous hyperinflation of the lungs. Stable 7 mm opacity in the left mid lung dating back to 12/11/2015 without  significant change. Degenerative change about both shoulders and dorsal spine. Cholecystectomy. IMPRESSION: Emphysematous hyperinflation of the lungs consistent with COPD. No active pulmonary disease. Stable 7 mm nodular opacity in the left mid lung dating back to 04/05/2016. Electronically Signed   By: Ashley Royalty M.D.   On: 01/12/2018 19:00   Scheduled Meds: . albuterol  5 mg Nebulization Once  . amLODipine  10 mg Oral Daily  . aspirin EC  325 mg Oral Daily  . atorvastatin  80 mg Oral q1800  . enoxaparin (LOVENOX) injection  40 mg Subcutaneous Q24H  . hydrALAZINE  75 mg Oral Q8H  . levothyroxine  75 mcg Oral QAC breakfast  . pantoprazole  40 mg Oral Q1200  . umeclidinium-vilanterol  1 puff Inhalation Daily    LOS: 0 days   Time spent: 30  Caylee Vlachos, PA-S [facility] 01/13/2018, 10:20 AM   Pager [phone] --- please page though AMION:  [email or web address] Password [email or web address] If 7PM-7AM, please contact night-coverage

## 2018-01-13 NOTE — Progress Notes (Signed)
Patient arrived to [address] from ED. Patient alert and oriented. Patient educated on plan of care. CCMD notified. Admission assessment complete. No complaints at this time. Will continue to monitor patient.

## 2018-01-13 NOTE — Progress Notes (Signed)
  Echocardiogram 2D Echocardiogram has been performed.  [name] 01/13/2018, 11:29 AM

## 2018-01-13 NOTE — Progress Notes (Signed)
MD Arrien gave verbal order for heart healthy diet  Ordered lunch for pt

## 2018-01-13 NOTE — Telephone Encounter (Signed)
It is fine to start Breo once discharged. Please send prescription. Thanks so much.

## 2018-01-14 ENCOUNTER — Observation Stay (HOSPITAL_COMMUNITY): Payer: Medicare Other

## 2018-01-14 DIAGNOSIS — J449 Chronic obstructive pulmonary disease, unspecified: Secondary | ICD-10-CM

## 2018-01-14 DIAGNOSIS — E039 Hypothyroidism, unspecified: Secondary | ICD-10-CM

## 2018-01-14 DIAGNOSIS — N183 Chronic kidney disease, stage 3 (moderate): Secondary | ICD-10-CM

## 2018-01-14 DIAGNOSIS — J441 Chronic obstructive pulmonary disease with (acute) exacerbation: Secondary | ICD-10-CM

## 2018-01-14 DIAGNOSIS — I10 Essential (primary) hypertension: Secondary | ICD-10-CM | POA: Diagnosis not present

## 2018-01-14 LAB — PULMONARY FUNCTION TEST
DL/VA % pred: 64 %
DL/VA: 3.2 ml/min/mmHg/L
DLCO cor % pred: 53 %
DLCO cor: 13.86 ml/min/mmHg
DLCO unc % pred: 52 %
DLCO unc: 13.73 ml/min/mmHg
FEF 25-75 Post: 0.92 L/s
FEF 25-75 Pre: 0.52 L/s
FEF2575-%Change-Post: 77 %
FEF2575-%Pred-Post: 100 %
FEF2575-%Pred-Pre: 56 %
FEV1-%Change-Post: 13 %
FEV1-%Pred-Post: 96 %
FEV1-%Pred-Pre: 84 %
FEV1-Post: 1.61 L
FEV1-Pre: 1.42 L
FEV1FVC-%Change-Post: 2 %
FEV1FVC-%Pred-Pre: 80 %
FEV6-%Change-Post: 10 %
FEV6-%Pred-Post: 124 %
FEV6-%Pred-Pre: 112 %
FEV6-Post: 2.63 L
FEV6-Pre: 2.39 L
FEV6FVC-%Change-Post: 0 %
FEV6FVC-%Pred-Post: 103 %
FEV6FVC-%Pred-Pre: 104 %
FVC-%Change-Post: 10 %
FVC-%Pred-Post: 119 %
FVC-%Pred-Pre: 108 %
FVC-Post: 2.72 L
FVC-Pre: 2.46 L
Post FEV1/FVC ratio: 59 %
Post FEV6/FVC ratio: 97 %
Pre FEV1/FVC ratio: 58 %
Pre FEV6/FVC Ratio: 97 %
RV % pred: 121 %
RV: 3.23 L
TLC % pred: 112 %
TLC: 5.92 L

## 2018-01-14 LAB — BASIC METABOLIC PANEL WITH GFR
Anion gap: 8 (ref 5–15)
BUN: 14 mg/dL (ref 8–23)
CO2: 26 mmol/L (ref 22–32)
Calcium: 8.5 mg/dL — ABNORMAL LOW (ref 8.9–10.3)
Chloride: 106 mmol/L (ref 98–111)
Creatinine, Ser: 1.41 mg/dL — ABNORMAL HIGH (ref 0.44–1.00)
GFR calc Af Amer: 37 mL/min — ABNORMAL LOW
GFR calc non Af Amer: 32 mL/min — ABNORMAL LOW
Glucose, Bld: 98 mg/dL (ref 70–99)
Potassium: 4 mmol/L (ref 3.5–5.1)
Sodium: 140 mmol/L (ref 135–145)

## 2018-01-14 MED ORDER — ALBUTEROL SULFATE (2.5 MG/3ML) 0.083% IN NEBU
2.5000 mg | INHALATION_SOLUTION | Freq: Once | RESPIRATORY_TRACT | Status: AC
  Administered 2018-01-14: 2.5 mg via RESPIRATORY_TRACT

## 2018-01-14 NOTE — Plan of Care (Signed)
  Problem: Education: Goal: Knowledge of General Education information will improve Outcome: Progressing   Problem: Health Behavior/Discharge Planning: Goal: Ability to manage health-related needs will improve Outcome: Progressing   Problem: Clinical Measurements: Goal: Ability to maintain clinical measurements within normal limits will improve Outcome: Progressing Goal: Will remain free from infection Outcome: Progressing Goal: Diagnostic test results will improve Outcome: Progressing Goal: Respiratory complications will improve Outcome: Progressing Goal: Cardiovascular complication will be avoided Outcome: Progressing   Problem: Activity: Goal: Risk for activity intolerance will decrease Outcome: Progressing   Problem: Nutrition: Goal: Adequate nutrition will be maintained Outcome: Progressing   Problem: Coping: Goal: Level of anxiety will decrease Outcome: Progressing   Problem: Elimination: Goal: Will not experience complications related to bowel motility Outcome: Progressing Goal: Will not experience complications related to urinary retention Outcome: Progressing   Problem: Skin Integrity: Goal: Risk for impaired skin integrity will decrease Outcome: Progressing   Problem: Activity: Goal: Ability to tolerate increased activity will improve Outcome: Progressing   Problem: Cardiac: Goal: Ability to achieve and maintain adequate cardiopulmonary perfusion will improve Outcome: Progressing   Problem: Education: Goal: Knowledge of disease or condition will improve Outcome: Progressing Goal: Knowledge of the prescribed therapeutic regimen will improve Outcome: Progressing   Problem: Activity: Goal: Ability to tolerate increased activity will improve Outcome: Progressing Goal: Will verbalize the importance of balancing activity with adequate rest periods Outcome: Progressing   Problem: Respiratory: Goal: Ability to maintain a clear airway will  improve Outcome: Progressing Goal: Levels of oxygenation will improve Outcome: Progressing Goal: Ability to maintain adequate ventilation will improve Outcome: Progressing

## 2018-01-14 NOTE — Discharge Summary (Signed)
Physician Discharge Summary  [name]:[id number] DOB: Mar 03, 1925 DOA: 01/12/2018  PCP: Marletta Lor, MD  Admit date: 01/12/2018 Discharge date: 01/14/2018  Time spent: 35 minutes  Recommendations for Outpatient Follow-up:  1. Follow up Pulmonology in 2 weeks   Discharge Diagnoses:  Principal Problem:   SOB (shortness of breath) Active Problems:   Hypothyroidism   Essential hypertension   Stroke (cerebrum) (HCC)   HLD (hyperlipidemia)   CKD (chronic kidney disease), stage III (HCC)   New onset left bundle branch block (LBBB)   COPD (chronic obstructive pulmonary disease) ([place])   Discharge Condition: Stable  Diet recommendation: heart healthy diet  Filed Weights   01/12/18 2305 01/13/18 0001 01/14/18 0311  Weight: 62.4 kg (137 lb 9.1 oz) 61.7 kg (136 lb) 61.3 kg (135 lb 1.6 oz)    History of present illness:  84 year old female who presented with dyspnea.  She does have the significant past medical history for hypertension, dyslipidemia, COPD, hypothyroidism, Graves' disease, chronic kidney disease stage III.  Reported 7 days of progressive and worsening dyspnea, associated with mild dry cough, no wheezing, no PND or orthopnea but positive lower extremity edema.  On the initial physical examination blood pressure 156/68, heart rate 79, respiratory rate 18, oxygen saturation 95%.  Moist mucous membranes, lungs clear to auscultation bilaterally, heart S1-S2 present rhythmic, positive 3 out of 6 systolic murmur at the base, radiated to the carotids, lungs clear to auscultation bilaterally, abdomen soft nontender, no lower extremity edema.  139, potassium 3.6, chloride 107, bicarb 23, glucose 98, BUN 16, creatinine 1.26, white count 4.8, hemoglobin 13.1, hematocrit 41.5, platelets 167, chest x-ray with hyperinflation, no infiltrates.  EKG with sinus rhythm, left axis deviation, left bundle branch block (chronic changes).   PFTs  Hospital Course:   COPD exacerbation  mild,-patient symptoms have improved when she was admitted.  She was started on inhaled steroid.  Underwent PFTs again which showed some improvement as compared to previous PFTs in 2017.  Patient recently had worked up for PE with VQ scan which was low probability for PE.  At this time patient will be discharged on Breo, will discontinue Anoro inhaler.  She will follow-up with pulmonary as outpatient.  Hypertension-blood pressures controlled, continue amlodipine, hydralazine.  Hypothyroidism-continue Synthroid  Dyslipidemia - continue statin.  CKD stage III- Creatinine stable at 1.41.   Procedures:  Pulmonary function testing  Consultations:  None  Discharge Exam: Vitals:   01/14/18 0908 01/14/18 1151  BP: (!) 146/55 (!) 151/68  Pulse: 67 68  Resp: 20 20  Temp: 97.7 F (36.5 C) 98.2 F (36.8 C)  SpO2: 94% 95%    General: *Appears in no acute distress Cardiovascular: S1-S2, regular Respiratory: Clear to auscultation bilaterally  Discharge Instructions   Discharge Instructions    Diet - low sodium heart healthy   Complete by:  As directed    Increase activity slowly   Complete by:  As directed      Allergies as of 01/14/2018      Reactions   Catapres [clonidine Hcl] Other (See Comments)   Made pt feel horrible, shaky, weak and nausea   Lisinopril Anaphylaxis   Tongue swelling   Labetalol Hcl Other (See Comments)   Caused bradycardia and syncope      Medication List    STOP taking these medications   ANORO ELLIPTA 62.5-25 MCG/INH Aepb Generic drug:  umeclidinium-vilanterol     TAKE these medications   AEROCHAMBER MV inhaler Use as instructed  albuterol (2.5 MG/3ML) 0.083% nebulizer solution Commonly known as:  PROVENTIL Take 3 mLs (2.5 mg total) by nebulization every 6 (six) hours as needed for wheezing or shortness of breath.   albuterol 108 (90 Base) MCG/ACT inhaler Commonly known as:  PROAIR HFA Inhale 2 puffs into the lungs every 6 (six) hours  as needed for wheezing or shortness of breath.   amLODipine 10 MG tablet Commonly known as:  NORVASC TAKE 1 TABLET EVERY DAY   aspirin EC 81 MG tablet Take 1 tablet (81 mg total) by mouth daily.   atorvastatin 80 MG tablet Commonly known as:  LIPITOR TAKE 1 TABLET BY MOUTH EVERY DAY AT 6PM   [facility] DRY EYE RELIEF 0.2-0.2-1 % Soln Generic drug:  Glycerin-Hypromellose-PEG 400 Place 1 drop into both eyes as needed.   fluticasone furoate-vilanterol 100-25 MCG/INH Aepb Commonly known as:  BREO ELLIPTA Inhale 1 puff into the lungs daily.   hydrALAZINE 25 MG tablet Commonly known as:  APRESOLINE TAKE 3 TABLETS BY MOUTH 3 TIMES A DAY   levothyroxine 75 MCG tablet Commonly known as:  SYNTHROID, LEVOTHROID TAKE 1 TABLET BY MOUTH EVERY DAY      Allergies  Allergen Reactions  . Catapres [Clonidine Hcl] Other (See Comments)    Made pt feel horrible, shaky, weak and nausea  . Lisinopril Anaphylaxis    Tongue swelling  . Labetalol Hcl Other (See Comments)    Caused bradycardia and syncope      The results of significant diagnostics from this hospitalization (including imaging, microbiology, ancillary and laboratory) are listed below for reference.    Significant Diagnostic Studies: Dg Chest 2 View  Result Date: 01/12/2018 CLINICAL DATA:  Dyspnea EXAM: CHEST - 2 VIEW COMPARISON:  Chest CT 04/06/2003, CXR 11/12/2017 and dating back to 12/11/2015 FINDINGS: The heart size and mediastinal contours are within normal limits. Aortic atherosclerosis without aneurysm. Emphysematous hyperinflation of the lungs. Stable 7 mm opacity in the left mid lung dating back to 12/11/2015 without significant change. Degenerative change about both shoulders and dorsal spine. Cholecystectomy. IMPRESSION: Emphysematous hyperinflation of the lungs consistent with COPD. No active pulmonary disease. Stable 7 mm nodular opacity in the left mid lung dating back to 04/05/2016. Electronically Signed   By: Ashley Royalty  M.D.   On: 01/12/2018 19:00    Microbiology: No results found for this or any previous visit (from the past 240 hour(s)).   Labs: Basic Metabolic Panel: Recent Labs  Lab 01/12/18 1916 01/13/18 1531 01/14/18 0552  NA 139 140 140  K 3.6 3.7 4.0  CL 107 106 106  CO2 23 24 26   GLUCOSE 98 144* 98  BUN 16 14 14   CREATININE 1.26* 1.31* 1.41*  CALCIUM 8.9 8.6* 8.5*   Liver Function Tests: No results for input(s): AST, ALT, ALKPHOS, BILITOT, PROT, ALBUMIN in the last 168 hours. No results for input(s): LIPASE, AMYLASE in the last 168 hours. No results for input(s): AMMONIA in the last 168 hours. CBC: Recent Labs  Lab 01/12/18 1916  WBC 4.8  NEUTROABS 3.0  HGB 13.1  HCT 41.5  MCV 98.3  PLT 167   Cardiac Enzymes: Recent Labs  Lab 01/13/18 0021 01/13/18 0630 01/13/18 0936  TROPONINI <0.03 <0.03 <0.03   BNP: BNP (last 3 results) Recent Labs    11/12/17 1825 01/13/18 0021  BNP 111.9* 105.0*      Signed:  Oswald Hillock MD.  [facility] 01/14/2018, 4:42 PM

## 2018-01-14 NOTE — Progress Notes (Signed)
pts daughter wants to discuss all discharge medications with physician.  Notified MD pts PFT is completed. Awaiting discharge orders.

## 2018-01-14 NOTE — Plan of Care (Signed)
Problem: Education: Goal: Knowledge of General Education information will improve 01/14/2018 1820 by Milderd Meager, RN Outcome: Adequate for Discharge 01/14/2018 1626 by Milderd Meager, RN Outcome: Progressing   Problem: Health Behavior/Discharge Planning: Goal: Ability to manage health-related needs will improve 01/14/2018 1820 by Milderd Meager, RN Outcome: Adequate for Discharge 01/14/2018 1626 by Milderd Meager, RN Outcome: Progressing   Problem: Clinical Measurements: Goal: Ability to maintain clinical measurements within normal limits will improve 01/14/2018 1820 by Milderd Meager, RN Outcome: Adequate for Discharge 01/14/2018 1626 by Milderd Meager, RN Outcome: Progressing Goal: Will remain free from infection 01/14/2018 1820 by Milderd Meager, RN Outcome: Adequate for Discharge 01/14/2018 1626 by Milderd Meager, RN Outcome: Progressing Goal: Diagnostic test results will improve 01/14/2018 1820 by Milderd Meager, RN Outcome: Adequate for Discharge 01/14/2018 1626 by Milderd Meager, RN Outcome: Progressing Goal: Respiratory complications will improve 01/14/2018 1820 by Milderd Meager, RN Outcome: Adequate for Discharge 01/14/2018 1626 by Milderd Meager, RN Outcome: Progressing Goal: Cardiovascular complication will be avoided 01/14/2018 1820 by Milderd Meager, RN Outcome: Adequate for Discharge 01/14/2018 1626 by Milderd Meager, RN Outcome: Progressing   Problem: Activity: Goal: Risk for activity intolerance will decrease 01/14/2018 1820 by Milderd Meager, RN Outcome: Adequate for Discharge 01/14/2018 1626 by Milderd Meager, RN Outcome: Progressing   Problem: Nutrition: Goal: Adequate nutrition will be maintained 01/14/2018 1820 by Milderd Meager, RN Outcome: Adequate for Discharge 01/14/2018 1626 by Milderd Meager, RN Outcome: Progressing   Problem: Coping: Goal: Level of anxiety will  decrease 01/14/2018 1820 by Milderd Meager, RN Outcome: Adequate for Discharge 01/14/2018 1626 by Milderd Meager, RN Outcome: Progressing   Problem: Elimination: Goal: Will not experience complications related to bowel motility 01/14/2018 1820 by Milderd Meager, RN Outcome: Adequate for Discharge 01/14/2018 1626 by Milderd Meager, RN Outcome: Progressing Goal: Will not experience complications related to urinary retention 01/14/2018 1820 by Milderd Meager, RN Outcome: Adequate for Discharge 01/14/2018 1626 by Milderd Meager, RN Outcome: Progressing   Problem: Skin Integrity: Goal: Risk for impaired skin integrity will decrease 01/14/2018 1820 by Milderd Meager, RN Outcome: Adequate for Discharge 01/14/2018 1626 by Milderd Meager, RN Outcome: Progressing   Problem: Activity: Goal: Ability to tolerate increased activity will improve 01/14/2018 1820 by Milderd Meager, RN Outcome: Adequate for Discharge 01/14/2018 1626 by Milderd Meager, RN Outcome: Progressing   Problem: Cardiac: Goal: Ability to achieve and maintain adequate cardiopulmonary perfusion will improve 01/14/2018 1820 by Milderd Meager, RN Outcome: Adequate for Discharge 01/14/2018 1626 by Milderd Meager, RN Outcome: Progressing   Problem: Education: Goal: Knowledge of disease or condition will improve 01/14/2018 1820 by Milderd Meager, RN Outcome: Adequate for Discharge 01/14/2018 1626 by Milderd Meager, RN Outcome: Progressing Goal: Knowledge of the prescribed therapeutic regimen will improve 01/14/2018 1820 by Milderd Meager, RN Outcome: Adequate for Discharge 01/14/2018 1626 by Milderd Meager, RN Outcome: Progressing   Problem: Activity: Goal: Ability to tolerate increased activity will improve 01/14/2018 1820 by Milderd Meager, RN Outcome: Adequate for Discharge 01/14/2018 1626 by Milderd Meager, RN Outcome: Progressing Goal: Will verbalize  the importance of balancing activity with adequate rest periods 01/14/2018 1820 by Milderd Meager, RN Outcome: Adequate for Discharge 01/14/2018 1626 by Milderd Meager, RN Outcome: Progressing   Problem: Respiratory: Goal: Ability to maintain a clear airway will improve 01/14/2018 1820 by Norma Fredrickson,  Wilmer Floor, RN Outcome: Adequate for Discharge 01/14/2018 1626 by Milderd Meager, RN Outcome: Progressing Goal: Levels of oxygenation will improve 01/14/2018 1820 by Milderd Meager, RN Outcome: Adequate for Discharge 01/14/2018 1626 by Milderd Meager, RN Outcome: Progressing Goal: Ability to maintain adequate ventilation will improve 01/14/2018 1820 by Milderd Meager, RN Outcome: Adequate for Discharge 01/14/2018 1626 by Milderd Meager, RN Outcome: Progressing

## 2018-01-14 NOTE — Progress Notes (Signed)
Patient ready for discharge home with daughter. Reviewed all discharge instructions including prescriptions and f/u appointments.

## 2018-01-15 ENCOUNTER — Telehealth: Payer: Self-pay | Admitting: Internal Medicine

## 2018-01-15 NOTE — Telephone Encounter (Signed)
Admit date: 01/12/2018 Discharge date: 01/14/2018  Time spent: 35 minutes  Recommendations for Outpatient Follow-up:  1. Follow up Pulmonology in 2 weeks   Discharge Diagnoses:  Principal Problem:   SOB (shortness of breath) Active Problems:   Hypothyroidism   Essential hypertension   Stroke (cerebrum) (HCC)   HLD (hyperlipidemia)   CKD (chronic kidney disease), stage III (HCC)   New onset left bundle branch block (LBBB)   COPD (chronic obstructive pulmonary disease) ([place])   Discharge Condition: Stable  Diet recommendation: heart healthy diet  Transition Care Management Follow-up Telephone Call   Date discharged?  01/14/18   How have you been since you were released from the hospital? "Im doing great"   Do you understand why you were in the hospital? yes   Do you understand the discharge instructions? yes   Where were you discharged to? Home by herself, daughter lives across the street   Items Reviewed:  Medications reviewed: yes  Allergies reviewed: yes  Dietary changes reviewed: yes  Referrals reviewed: yes   Functional Questionnaire:   Activities of Daily Living (ADLs):   She states they are independent in the following: ambulation, bathing and hygiene, feeding, continence, grooming, toileting, dressing and pt states "I am very independent and will stay that way!" States they require assistance with the following: n/a   Any transportation issues/concerns?: no   Any patient concerns? no   Confirmed importance and date/time of follow-up visits scheduled yes  Provider Appointment booked with 01/27/18 with Dr Burnice Logan at 12:45pm  Confirmed with patient if condition begins to worsen call PCP or go to the ER.  Patient was given the office number and encouraged to call back with question or concerns.  : yes

## 2018-01-27 ENCOUNTER — Ambulatory Visit: Payer: Medicare Other | Admitting: Internal Medicine

## 2018-01-27 ENCOUNTER — Encounter: Payer: Self-pay | Admitting: Internal Medicine

## 2018-01-27 VITALS — BP 158/70 | HR 86 | Temp 97.7°F | Wt 135.4 lb

## 2018-01-27 DIAGNOSIS — I10 Essential (primary) hypertension: Secondary | ICD-10-CM

## 2018-01-27 DIAGNOSIS — J42 Unspecified chronic bronchitis: Secondary | ICD-10-CM | POA: Diagnosis not present

## 2018-01-27 NOTE — Patient Instructions (Signed)
Limit your sodium (Salt) intake  Pulmonary follow-up as scheduled  Follow-up with Tommi Rumps in approximately 4 months with flu vaccine

## 2018-01-27 NOTE — Progress Notes (Signed)
Subjective:    Patient ID: [name], female    DOB: 1925-03-25, 84 y.o.   MRN: [id number]  HPI  Admit date: 01/12/2018 Discharge date: 01/14/2018   Recommendations for Outpatient Follow-up:  1. Follow up Pulmonology in 2 weeks   Discharge Diagnoses:  Principal Problem:   SOB (shortness of breath) Active Problems:   Hypothyroidism   Essential hypertension   Stroke (cerebrum) (HCC)   HLD (hyperlipidemia)   CKD (chronic kidney disease), stage III (HCC)   New onset left bundle branch block (LBBB)   COPD (chronic obstructive pulmonary disease) ([place])  84 year old patient who is seen today following a recent hospital discharge.  She was admitted with decompensated emphysematous COPD. She presently has done quite well since her discharge.  She remains on home nebulizer treatments twice daily. Presently doing quite well on Breo but has been treated in the past with Symbicort and Anoro (AC/LABA).  She feels that she has done well on both products she is scheduled for pulmonary follow-up  On August 2. She has a history of essential hypertension carotid artery disease as well as aortic arch atherosclerosis.  She is scheduled for vascular surgery follow-up as well  She has a history of GI bleeding secondary to a gastric ulcer.  Recent CBC unremarkable  Denies any cardiopulmonary complaints  Hospital records reviewed  Past Medical History:  Diagnosis Date  . Anxiety   . Aortic arch atherosclerosis ([place]) 06/24/2014  . Atherosclerotic ulcer of aorta ([place]) 06/24/2014  . Carotid artery occlusion   . [place] DISEASE, LUMBAR 12/16/2008  . DIVERTICULOSIS, COLON 09/30/2008  . DYSPNEA 07/13/2008  . Graves disease   . History of embolic stroke 09/20/4006   Left brain  . HYPERLIPIDEMIA 03/06/2007  . HYPERTENSION 03/06/2007  . HYPOTHYROIDISM 10/13/2007  . OSTEOARTHRITIS 03/06/2007  . Personal history of colonic polyps 09/30/2008  . Stroke ([place])    06/2014           . TIA (transient ischemic  attack)   . TOBACCO USE, QUIT 04/12/2009  . WEAKNESS 11/09/2007     Social History   Socioeconomic History  . Marital status: Divorced    Spouse name: Not on file  . Number of children: 4  . Years of education: college  . Highest education level: Not on file  Occupational History  . Occupation: N/A  Social Needs  . Financial resource strain: Not on file  . Food insecurity:    Worry: Not on file    Inability: Not on file  . Transportation needs:    Medical: Not on file    Non-medical: Not on file  Tobacco Use  . Smoking status: Former Smoker    Last attempt to quit: 07/08/1978    Years since quitting: 39.5  . Smokeless tobacco: Never Used  Substance and Sexual Activity  . Alcohol use: Yes    Comment: "red wine"- some nights  . Drug use: No  . Sexual activity: Not on file  Lifestyle  . Physical activity:    Days per week: Not on file    Minutes per session: Not on file  . Stress: Not on file  Relationships  . Social connections:    Talks on phone: Not on file    Gets together: Not on file    Attends religious service: Not on file    Active member of club or organization: Not on file    Attends meetings of clubs or organizations: Not on file    Relationship  status: Not on file  . Intimate partner violence:    Fear of current or ex partner: Not on file    Emotionally abused: Not on file    Physically abused: Not on file    Forced sexual activity: Not on file  Other Topics Concern  . Not on file  Social History Narrative      Patient is right handed.   Patient drinks 1 cup caffeine daily.    Past Surgical History:  Procedure Laterality Date  . CARDIAC CATHETERIZATION    . CATARACT EXTRACTION Bilateral   . CHOLECYSTECTOMY    . ENDARTERECTOMY Left 11/13/2015   Procedure: LEFT CAROTID ARTERY ENDARTERECTOMY;  Surgeon: Conrad [place], MD;  Location: [facility];  Service: Vascular;  Laterality: Left;  . ESOPHAGOGASTRODUODENOSCOPY (EGD) WITH PROPOFOL N/A 10/11/2016   Procedure:  ESOPHAGOGASTRODUODENOSCOPY (EGD) WITH PROPOFOL;  Surgeon: Mauri Pole, MD;  Location: [facility];  Service: Endoscopy;  Laterality: N/A;  . KNEE SURGERY    . PATCH ANGIOPLASTY Left 11/13/2015   Procedure: WITH 1CM X 6CM  XENOSURE BIOLOGIC PATCH ANGIOPLASTY;  Surgeon: Conrad [place], MD;  Location: [facility];  Service: Vascular;  Laterality: Left;  . TEE WITHOUT CARDIOVERSION N/A 06/24/2014   Procedure: TRANSESOPHAGEAL ECHOCARDIOGRAM (TEE);  Surgeon: Sanda Klein, MD;  [facility] END[facility]COPY;  Service: Cardiovascular;  Laterality: N/A;    Family History  Problem Relation Age of Onset  . Stomach cancer Maternal Grandmother   . Colon cancer Neg Hx     Allergies  Allergen Reactions  . Catapres [Clonidine Hcl] Other (See Comments)    Made pt feel horrible, shaky, weak and nausea  . Lisinopril Anaphylaxis    Tongue swelling  . Labetalol Hcl Other (See Comments)    Caused bradycardia and syncope    Current Outpatient Medications on File Prior to Visit  Medication Sig Dispense Refill  . albuterol (PROAIR HFA) 108 (90 Base) MCG/ACT inhaler Inhale 2 puffs into the lungs every 6 (six) hours as needed for wheezing or shortness of breath. 8.5 Inhaler 5  . albuterol (PROVENTIL) (2.5 MG/3ML) 0.083% nebulizer solution Take 3 mLs (2.5 mg total) by nebulization every 6 (six) hours as needed for wheezing or shortness of breath. 75 mL 12  . amLODipine (NORVASC) 10 MG tablet TAKE 1 TABLET EVERY DAY 90 tablet 3  . aspirin EC 81 MG tablet Take 1 tablet (81 mg total) by mouth daily. 30 tablet 0  . atorvastatin (LIPITOR) 80 MG tablet TAKE 1 TABLET BY MOUTH EVERY DAY AT 6PM 90 tablet 2  . fluticasone furoate-vilanterol (BREO ELLIPTA) 100-25 MCG/INH AEPB Inhale 1 puff into the lungs daily. 30 each 5  . Glycerin-Hypromellose-PEG 400 ([facility] DRY EYE RELIEF) 0.2-0.2-1 % SOLN Place 1 drop into both eyes as needed.    . hydrALAZINE (APRESOLINE) 25 MG tablet TAKE 3 TABLETS BY MOUTH 3 TIMES A DAY 270 tablet 9  .  levothyroxine (SYNTHROID, LEVOTHROID) 75 MCG tablet TAKE 1 TABLET BY MOUTH EVERY DAY 90 tablet 1  . Spacer/Aero-Holding Chambers (AEROCHAMBER MV) inhaler Use as instructed 1 each 0   No current facility-administered medications on file prior to visit.     BP (!) 158/70 (BP Location: Right Arm, Patient Position: Sitting, Cuff Size: Normal)   Pulse 86   Temp 97.7 F (36.5 C) (Oral)   Wt 135 lb 6.4 oz (61.4 kg)   SpO2 94%   BMI 21.85 kg/m      Review of Systems  Constitutional: Negative.   HENT:  Negative for congestion, dental problem, hearing loss, rhinorrhea, sinus pressure, sore throat and tinnitus.   Eyes: Negative for pain, discharge and visual disturbance.  Respiratory: Positive for shortness of breath. Negative for cough.   Cardiovascular: Negative for chest pain, palpitations and leg swelling.  Gastrointestinal: Negative for abdominal distention, abdominal pain, blood in stool, constipation, diarrhea, nausea and vomiting.  Genitourinary: Negative for difficulty urinating, dysuria, flank pain, frequency, hematuria, pelvic pain, urgency, vaginal bleeding, vaginal discharge and vaginal pain.  Musculoskeletal: Negative for arthralgias, gait problem and joint swelling.  Skin: Negative for rash.  Neurological: Negative for dizziness, syncope, speech difficulty, weakness, numbness and headaches.  Hematological: Negative for adenopathy.  Psychiatric/Behavioral: Negative for agitation, behavioral problems and dysphoric mood. The patient is not nervous/anxious.        Objective:   Physical Exam  Constitutional: She is oriented to person, place, and time. She appears well-developed and well-nourished.  Blood pressure 144/70  HENT:  Head: Normocephalic.  Right Ear: External ear normal.  Left Ear: External ear normal.  Mouth/Throat: Oropharynx is clear and moist.  Eyes: Pupils are equal, round, and reactive to light. Conjunctivae and EOM are normal.  Neck: Normal range of motion.  Neck supple. No thyromegaly present.  Cardiovascular: Normal rate, regular rhythm and intact distal pulses.  Murmur heard. Grade 2/6 systolic murmur  Pulmonary/Chest: Effort normal and breath sounds normal.  Abdominal: Soft. Bowel sounds are normal. She exhibits no mass. There is no tenderness.  Musculoskeletal: Normal range of motion. She exhibits no edema.  Lymphadenopathy:    She has no cervical adenopathy.  Neurological: She is alert and oriented to person, place, and time.  Skin: Skin is warm and dry. No rash noted.  Psychiatric: She has a normal mood and affect. Her behavior is normal.          Assessment & Plan:   COPD/emphysema.  Patient has done quite well on Breo.  We will continue present regimen. Essential hypertension stable Hypothyroidism Carotid artery stenosis Dyslipidemia  Pulmonary follow-up as scheduled Patient will establish with a new provider in 4 months with flu vaccine  Marletta Lor

## 2018-02-04 ENCOUNTER — Ambulatory Visit: Payer: Medicare Other | Admitting: Pulmonary Disease

## 2018-02-06 ENCOUNTER — Ambulatory Visit: Payer: Medicare Other | Admitting: Pulmonary Disease

## 2018-02-06 ENCOUNTER — Encounter: Payer: Self-pay | Admitting: Pulmonary Disease

## 2018-02-06 VITALS — BP 140/72 | HR 78 | Ht 66.0 in | Wt 135.0 lb

## 2018-02-06 DIAGNOSIS — J449 Chronic obstructive pulmonary disease, unspecified: Secondary | ICD-10-CM

## 2018-02-06 DIAGNOSIS — R0600 Dyspnea, unspecified: Secondary | ICD-10-CM

## 2018-02-06 MED ORDER — FLUTICASONE-UMECLIDIN-VILANT 100-62.5-25 MCG/INH IN AEPB: 1.0000 | INHALATION_SPRAY | Freq: Every day | RESPIRATORY_TRACT | 0 refills | Status: DC

## 2018-02-06 MED ORDER — FLUTICASONE-UMECLIDIN-VILANT 100-62.5-25 MCG/INH IN AEPB: 1.0000 | INHALATION_SPRAY | Freq: Every day | RESPIRATORY_TRACT | 6 refills | Status: AC

## 2018-02-06 MED ORDER — FLUTICASONE-UMECLIDIN-VILANT 100-62.5-25 MCG/INH IN AEPB: 1.0000 | INHALATION_SPRAY | Freq: Every day | RESPIRATORY_TRACT | 0 refills | Status: AC

## 2018-02-06 NOTE — Patient Instructions (Signed)
I am glad you are doing well after discharge We will stop the [facility] and start trelegy inhaler We will check your oxygen levels on exertion today Follow-up in 6 months.

## 2018-02-06 NOTE — Progress Notes (Signed)
[name]    [id number]    18-Sep-1924  Primary Care Physician:Kwiatkowski, Doretha Sou, MD  Referring Physician: Marletta Lor, MD [place], [place] [address]  Chief complaint:  Follow up for  Moderate COPD Vocal cord paralysis  HPI: [name] is a 84 year old with complaints of dyspnea on exertion. The problem started in May 2017 after a carotid artery end arterectomy. This procedure was complicated by left vocal cord paralysis, hoarseness. She complains of daily cough with sputum production, wheezing. She denies any fevers, chills, hemoptysis. She had been tried on Advair for 2 months with some improvement in symptoms. She has difficulty clearing secretions. She has has a flutter valve and spirometer at home but is not using this on a regular basis.  She was hospitalized in the emergency room in October 2017 for COPD exacerbation. She was discharged on a prednisone taper and amoxicillin. Records from this hospitalization reviewed below in data section Hospitalized in April 2018 with severe anemia, bleeding AVMs.  Underwent EGD with cauterization, clipping of bleeding lesion.  Interim History: Hospitalized for 2 days in July for mild COPD exacerbation.  She was started on breo instead of anoro Post discharge she is doing well.  She was started on supplemental oxygen but stopped using it after few days as her breathing improved.  Outpatient Encounter Medications as of 02/06/2018  Medication Sig  . albuterol (PROAIR HFA) 108 (90 Base) MCG/ACT inhaler Inhale 2 puffs into the lungs every 6 (six) hours as needed for wheezing or shortness of breath.  [name] Kitchen albuterol (PROVENTIL) (2.5 MG/3ML) 0.083% nebulizer solution Take 3 mLs (2.5 mg total) by nebulization every 6 (six) hours as needed for wheezing or shortness of breath.  [name] Kitchen amLODipine (NORVASC) 10 MG tablet TAKE 1 TABLET EVERY DAY  . aspirin EC 81 MG tablet Take 1 tablet (81 mg total) by mouth daily.  [name] Kitchen  atorvastatin (LIPITOR) 80 MG tablet TAKE 1 TABLET BY MOUTH EVERY DAY AT 6PM  . fluticasone furoate-vilanterol (BREO ELLIPTA) 100-25 MCG/INH AEPB Inhale 1 puff into the lungs daily.  . Glycerin-Hypromellose-PEG 400 ([facility] DRY EYE RELIEF) 0.2-0.2-1 % SOLN Place 1 drop into both eyes as needed.  . hydrALAZINE (APRESOLINE) 25 MG tablet TAKE 3 TABLETS BY MOUTH 3 TIMES A DAY  . levothyroxine (SYNTHROID, LEVOTHROID) 75 MCG tablet TAKE 1 TABLET BY MOUTH EVERY DAY  . Spacer/Aero-Holding Chambers (AEROCHAMBER MV) inhaler Use as instructed   No facility-administered encounter medications on file as of 02/06/2018.     Allergies as of 02/06/2018 - Review Complete 02/06/2018  Allergen Reaction Noted  . Catapres [clonidine hcl] Other (See Comments) 10/27/2015  . Lisinopril Anaphylaxis 06/22/2014  . Labetalol hcl Other (See Comments) 10/04/2015    Past Medical History:  Diagnosis Date  . Anxiety   . Aortic arch atherosclerosis ([place]) 06/24/2014  . Atherosclerotic ulcer of aorta ([place]) 06/24/2014  . Carotid artery occlusion   . [place] DISEASE, LUMBAR 12/16/2008  . DIVERTICULOSIS, COLON 09/30/2008  . DYSPNEA 07/13/2008  . Graves disease   . History of embolic stroke 7/35/3299   Left brain  . HYPERLIPIDEMIA 03/06/2007  . HYPERTENSION 03/06/2007  . HYPOTHYROIDISM 10/13/2007  . OSTEOARTHRITIS 03/06/2007  . Personal history of colonic polyps 09/30/2008  . Stroke ([place])    06/2014           . TIA (transient ischemic attack)   . TOBACCO USE, QUIT 04/12/2009  . WEAKNESS 11/09/2007    Past  Surgical History:  Procedure Laterality Date  . CARDIAC CATHETERIZATION    . CATARACT EXTRACTION Bilateral   . CHOLECYSTECTOMY    . ENDARTERECTOMY Left 11/13/2015   Procedure: LEFT CAROTID ARTERY ENDARTERECTOMY;  Surgeon: Conrad [place], MD;  Location: [facility];  Service: Vascular;  Laterality: Left;  . ESOPHAGOGASTRODUODENOSCOPY (EGD) WITH PROPOFOL N/A 10/11/2016   Procedure: ESOPHAGOGASTRODUODENOSCOPY (EGD) WITH PROPOFOL;  Surgeon:  Mauri Pole, MD;  Location: [facility];  Service: Endoscopy;  Laterality: N/A;  . KNEE SURGERY    . PATCH ANGIOPLASTY Left 11/13/2015   Procedure: WITH 1CM X 6CM  XENOSURE BIOLOGIC PATCH ANGIOPLASTY;  Surgeon: Conrad [place], MD;  Location: [facility];  Service: Vascular;  Laterality: Left;  . TEE WITHOUT CARDIOVERSION N/A 06/24/2014   Procedure: TRANSESOPHAGEAL ECHOCARDIOGRAM (TEE);  Surgeon: Sanda Klein, MD;  Location: [facility] ENDOSCOPY;  Service: Cardiovascular;  Laterality: N/A;    Family History  Problem Relation Age of Onset  . Stomach cancer Maternal Grandmother   . Colon cancer Neg Hx     Social History   Socioeconomic History  . Marital status: Divorced    Spouse name: Not on file  . Number of children: 4  . Years of education: college  . Highest education level: Not on file  Occupational History  . Occupation: N/A  Social Needs  . Financial resource strain: Not on file  . Food insecurity:    Worry: Not on file    Inability: Not on file  . Transportation needs:    Medical: Not on file    Non-medical: Not on file  Tobacco Use  . Smoking status: Former Smoker    Last attempt to quit: 07/08/1978    Years since quitting: 39.6  . Smokeless tobacco: Never Used  Substance and Sexual Activity  . Alcohol use: Yes    Comment: "red wine"- some nights  . Drug use: No  . Sexual activity: Not on file  Lifestyle  . Physical activity:    Days per week: Not on file    Minutes per session: Not on file  . Stress: Not on file  Relationships  . Social connections:    Talks on phone: Not on file    Gets together: Not on file    Attends religious service: Not on file    Active member of club or organization: Not on file    Attends meetings of clubs or organizations: Not on file    Relationship status: Not on file  . Intimate partner violence:    Fear of current or ex partner: Not on file    Emotionally abused: Not on file    Physically abused: Not on file    Forced sexual  activity: Not on file  Other Topics Concern  . Not on file  Social History Narrative      Patient is right handed.   Patient drinks 1 cup caffeine daily.   Review of systems: Review of Systems  Constitutional: Negative for fever and chills.  HENT: Negative.   Eyes: Negative for blurred vision.  Respiratory: as per HPI  Cardiovascular: Negative for chest pain and palpitations.  Gastrointestinal: Negative for vomiting, diarrhea, blood per rectum. Genitourinary: Negative for dysuria, urgency, frequency and hematuria.  Musculoskeletal: Negative for myalgias, back pain and joint pain.  Skin: Negative for itching and rash.  Neurological: Negative for dizziness, tremors, focal weakness, seizures and loss of consciousness.  Endo/Heme/Allergies: Negative for environmental allergies.  Psychiatric/Behavioral: Negative for depression, suicidal ideas and hallucinations.  All other systems reviewed and are negative.  Physical Exam: Blood pressure (!) 162/70, pulse 87, height 5\' 6"  (1.676 m), weight 136 lb 3.2 oz (61.8 kg), SpO2 96 %. Gen:      No acute distress HEENT:  EOMI, sclera anicteric Neck:     No masses; no thyromegaly Lungs:    Clear to auscultation bilaterally; normal respiratory effort CV:         Regular rate and rhythm; no murmurs Abd:      + bowel sounds; soft, non-tender; no palpable masses, no distension Ext:    No edema; adequate peripheral perfusion Skin:      Warm and dry; no rash Neuro: alert and oriented x 3 Psych: normal mood and affect  Data Reviewed: CT scan chest 11/26/12- Right lingular opacity, scattered sub cm pulmonary nodules, images reviewed.  CT 04/05/16- Stable pulmonary nodules Severe COPD. Chest x-ray 01/12/2018- hyperinflation, no active pulmonary disease.  Stable 7 mm opacity in the left midlung. I reviewed the images personally.  PFTs 04/02/16 FVC 2.54 (107%), FEV1 1.43 (82%), F/F 56, TLC 107%, DLCO 42% Mod obstructive lung disease with DLCO impairment.   No bronchodilator response  DATA from [employer], [place] CT of neck 04/09/16- no CT findings to suggest an acute soft tissue abnormality,  subtle asymmetric prominence of right sub-lingual tonsil. Asymmetric prominence of left vocal cord Multiple thyroid lesions Relative ectasia of left carotid bulb Moderate to advanced degenerative arthritis and disc disease  CT of the brain 04/09/16- no CT findings for acute intracranial abnormality. Minimal changes of aging.  Chest x-ray 04/09/16- no acute cardiac pulmonary disease. Emphysema.  EKG 04/09/16- sinus tachycardia, incomplete left bundle branch block, LVH, anterior Q waves  Assessment:  #1 Moderate COPD. Although she has moderate obstruction on PFTs her COPD is likely severe based on lung imaging and DLCO impairment.  He is recovering after recent hospitalization Currently on Breo.  We will change this to Trelegy inhaler.  #2 Lung nodule Stable on repeat CT from 2014 to 2017. Likely benign.  Plan/Recommendations: - Stop Breo, start trelegy.  Continue albuterol as needed. - Use incentive spirometer and flutter valve 3 times/ day.  Marshell Garfinkel MD [facility] 02/06/2018, 11:38 AM  CC: Marletta Lor, MD

## 2018-02-17 ENCOUNTER — Other Ambulatory Visit: Payer: Self-pay | Admitting: Physician Assistant

## 2018-02-22 ENCOUNTER — Other Ambulatory Visit: Payer: Self-pay | Admitting: Internal Medicine

## 2018-02-23 ENCOUNTER — Encounter: Payer: Self-pay | Admitting: Nurse Practitioner

## 2018-02-23 ENCOUNTER — Ambulatory Visit: Payer: Medicare Other | Admitting: Nurse Practitioner

## 2018-02-23 VITALS — BP 158/60 | HR 84 | Ht 66.0 in | Wt 135.1 lb

## 2018-02-23 DIAGNOSIS — R0989 Other specified symptoms and signs involving the circulatory and respiratory systems: Secondary | ICD-10-CM

## 2018-02-23 MED ORDER — AMLODIPINE BESYLATE 10 MG PO TABS: 10.0000 mg | ORAL_TABLET | Freq: Every day | ORAL | 3 refills | Status: DC

## 2018-02-23 NOTE — Patient Instructions (Addendum)
We will be checking the following labs today - NONE   Medication Instructions:    Continue with your current medicines.     Testing/Procedures To Be Arranged:  N/A  Follow-Up:   See me in 6 months.    Other Special Instructions:   N/A    If you need a refill on your cardiac medications before your next appointment, please call your pharmacy.   Call the [facility] office at [phone] if you have any questions, problems or concerns.      

## 2018-02-23 NOTE — Progress Notes (Signed)
CARDIOLOGY OFFICE NOTE  Date:  02/23/2018    [name] Date of Birth: 1924-08-30 Medical Record #[id number]  PCP:  [name], NP  Cardiologist:  Gillian Shields    Chief Complaint  Patient presents with  . Hypertension    6 month check - seen for Dr. Johnsie Cancel    History of Present Illness: [name] is a 84 y.o. female who presents today for a 6 month check. Seen for Dr. Johnsie Cancel. Has primarily followed with me over the past several years.   She has ahistory of labile HTN, HLD, hypothyroidism, CVA (left parietal in Dec. 2015), carotid artery diease w/high-grade left ICA stenosis with plaque ulceration, and pulmonary nodule.  She has been followed by vascular surgery (Dr. Bridgett Larsson) and was scheduled for cerebral angiogram. Doppler evaluation suggested a long LICA stenosis >43%. She became concerned about the risk of stroke and asked to have the procedure postponed until after cardiac evaluation. She was seen by Dr. Johnsie Cancel on 10/03/15 for pre operative clearance for possible LCEA.She was started on labetalol and her Norvasc was increased for better blood pressure control.  She took her first does of labetalol the following morning and shortly after began to feel weak, flushed and became very hot and had an episode of syncope. EMS was called and she was admitted 3/29-3/30/17 for work up. Her symptoms gradually improved. She was felt to be very sensitive to the labetalol with hypotension and bradycardia and now it is listed as an allergy. She had a Myoview during her admission on 10/03/15 which was low risk without scar or ischemia, EF normal. She was cleared for vascular surgery. It was felt that she should proceed with surgery as soon as possible. Diuretic therapy was discontinued due to mild renal insufficiency. She was discharged on amlodipine only for BP control.   Subsequent multiplephone notes revealingthat she was having issues with BPs and trialed on hydralazine  and clonidine. She did not tolerate the clonidine and this was discontinued after an ER visit. Her anxiety has played a big role in her BPP management.   Planwasfor L CEA on May of 2017- looks like this complicated by a vocal cord paralysis and "with possible TE from extensive aortic arch atherosclerosis".She wasreluctant to start anticoagulation. She elected to take ASA and Plavix instead.But then was admitted with profound anemia in the setting of aspirin and Plavix - transfused - had EGD and noted large AVM that was clipped. She is to stay just on aspirin despite being at risk for embolism from the aortic arch atherosclerosis.  I have seen her several times - she has tended to be very fixated on past events. She is now 69. Has done ok from our standpoint. Does not check her BP any more.   Comes in today. Here withher daughter.She was admitted last month with a COPD exacerbation. She had extensive labs. Had an echo - see below. She has had her inhalers changed. She has seen pulmonary in follow up. She feels like she is doing well. Plays in 2 bridge groups, still lives independently, still drives. No chest pain. No swelling. Does not check her BP at home. She is quite content with how she is doing. She does have daily alcohol use.   Past Medical History:  Diagnosis Date  . Anxiety   . Aortic arch atherosclerosis ([place]) 06/24/2014  . Atherosclerotic ulcer of aorta ([place]) 06/24/2014  . Carotid artery occlusion   . DISC DISEASE, LUMBAR  12/16/2008  . DIVERTICULOSIS, COLON 09/30/2008  . DYSPNEA 07/13/2008  . Graves disease   . History of embolic stroke 1/75/1025   Left brain  . HYPERLIPIDEMIA 03/06/2007  . HYPERTENSION 03/06/2007  . HYPOTHYROIDISM 10/13/2007  . OSTEOARTHRITIS 03/06/2007  . Personal history of colonic polyps 09/30/2008  . Stroke ([place])    06/2014           . TIA (transient ischemic attack)   . TOBACCO USE, QUIT 04/12/2009  . WEAKNESS 11/09/2007    Past Surgical History:    Procedure Laterality Date  . CARDIAC CATHETERIZATION    . CATARACT EXTRACTION Bilateral   . CHOLECYSTECTOMY    . ENDARTERECTOMY Left 11/13/2015   Procedure: LEFT CAROTID ARTERY ENDARTERECTOMY;  Surgeon: Conrad Meadow Glade, MD;  Location: [facility];  Service: Vascular;  Laterality: Left;  . ESOPHAGOGASTRODUODENOSCOPY (EGD) WITH PROPOFOL N/A 10/11/2016   Procedure: ESOPHAGOGASTRODUODENOSCOPY (EGD) WITH PROPOFOL;  Surgeon: Mauri Pole, MD;  Location: [facility];  Service: Endoscopy;  Laterality: N/A;  . KNEE SURGERY    . PATCH ANGIOPLASTY Left 11/13/2015   Procedure: WITH 1CM X 6CM  XENOSURE BIOLOGIC PATCH ANGIOPLASTY;  Surgeon: Conrad [place], MD;  Location: [facility];  Service: Vascular;  Laterality: Left;  . TEE WITHOUT CARDIOVERSION N/A 06/24/2014   Procedure: TRANSESOPHAGEAL ECHOCARDIOGRAM (TEE);  Surgeon: Sanda Klein, MD;  Location[facility] ENDOSCOPY;  Service: Cardiovascular;  Laterality: N/A;     Medications: Current Meds  Medication Sig  . albuterol (PROAIR HFA) 108 (90 Base) MCG/ACT inhaler Inhale 2 puffs into the lungs every 6 (six) hours as needed for wheezing or shortness of breath.  [name] Kitchen albuterol (PROVENTIL) (2.5 MG/3ML) 0.083% nebulizer solution Take 3 mLs (2.5 mg total) by nebulization every 6 (six) hours as needed for wheezing or shortness of breath.  [name] Kitchen amLODipine (NORVASC) 10 MG tablet Take 1 tablet (10 mg total) by mouth daily.  [name] Kitchen aspirin EC 81 MG tablet Take 1 tablet (81 mg total) by mouth daily.  [name] Kitchen atorvastatin (LIPITOR) 80 MG tablet TAKE 1 TABLET BY MOUTH EVERY DAY AT 6PM  . Glycerin-Hypromellose-PEG 400 ([employer] DRY EYE RELIEF) 0.2-0.2-1 % SOLN Place 1 drop into both eyes as needed.  . hydrALAZINE (APRESOLINE) 25 MG tablet TAKE 3 TABLETS BY MOUTH 3 TIMES A DAY  . levothyroxine (SYNTHROID, LEVOTHROID) 75 MCG tablet TAKE 1 TABLET BY MOUTH EVERY DAY  . Spacer/Aero-Holding Chambers (AEROCHAMBER MV) inhaler Use as instructed  . [DISCONTINUED] amLODipine (NORVASC) 10 MG tablet TAKE 1 TABLET BY  MOUTH EVERY DAY     Allergies: Allergies  Allergen Reactions  . Catapres [Clonidine Hcl] Other (See Comments)    Made pt feel horrible, shaky, weak and nausea  . Lisinopril Anaphylaxis    Tongue swelling  . Labetalol Hcl Other (See Comments)    Caused bradycardia and syncope    Social History: The patient  reports that she quit smoking about 39 years ago. She has never used smokeless tobacco. She reports that she drinks alcohol. She reports that she does not use drugs.   Family History: The patient's family history includes Stomach cancer in her maternal grandmother.   Review of Systems: Please see the history of present illness.   Otherwise, the review of systems is positive for none.   All other systems are reviewed and negative.   Physical Exam: VS:  BP (!) 158/60 (BP Location: Left Arm, Patient Position: Sitting, Cuff Size: Normal)   Pulse 84   Ht 5\' 6"  (1.676 m)   Wt 135  lb 1.9 oz (61.3 kg)   SpO2 94% Comment: at rest  BMI 21.81 kg/m  .  BMI Body mass index is 21.81 kg/m.  Wt Readings from Last 3 Encounters:  02/23/18 135 lb 1.9 oz (61.3 kg)  02/06/18 135 lb (61.2 kg)  01/27/18 135 lb 6.4 oz (61.4 kg)    General: Pleasant. Elderly female. Looks younger than her stated age. She is quite jovial. In no acute distress.   HEENT: Normal.  Neck: Supple, no JVD, carotid bruits, or masses noted.  Cardiac: Regular rate and rhythm. Soft outflow murmur. Trace edema.  Respiratory:  Lungs are clear to auscultation bilaterally with normal work of breathing.  GI: Soft and nontender.  MS: No deformity or atrophy. Gait and ROM intact.  Skin: Warm and dry. Color is normal.  Neuro:  Strength and sensation are intact and no gross focal deficits noted.  Psych: Alert, appropriate and with normal affect.   LABORATORY DATA:  EKG:  EKG is not ordered today.  Lab Results  Component Value Date   WBC 4.8 01/12/2018   HGB 13.1 01/12/2018   HCT 41.5 01/12/2018   PLT 167 01/12/2018     GLUCOSE 98 01/14/2018   CHOL 148 01/13/2018   TRIG 31 01/13/2018   HDL 76 01/13/2018   LDLDIRECT 141.7 05/01/2011   LDLCALC 66 01/13/2018   ALT 17 08/20/2017   AST 21 08/20/2017   NA 140 01/14/2018   K 4.0 01/14/2018   CL 106 01/14/2018   CREATININE 1.41 (H) 01/14/2018   BUN 14 01/14/2018   CO2 26 01/14/2018   TSH 2.69 10/09/2016   INR 1.12 11/21/2015   HGBA1C 5.7 (H) 01/13/2018     BNP (last 3 results) Recent Labs    11/12/17 1825 01/13/18 0021  BNP 111.9* 105.0*    ProBNP (last 3 results) No results for input(s): PROBNP in the last 8760 hours.   Other Studies Reviewed Today:  Echo Study Conclusions 01/2018  - Left ventricle: The cavity size was normal. There was moderate   concentric hypertrophy. Systolic function was normal. The   estimated ejection fraction was in the range of 55% to 60%. Wall   motion was normal; there were no regional wall motion   abnormalities. - Aortic valve: Valve mobility was mildly restricted. There was   mild stenosis. There was trivial regurgitation. Valve area (VTI):   1.53 cm^2. Valve area (Vmean): 1.42 cm^2. - Atrial septum: There was increased thickness of the septum,   consistent with lipomatous hypertrophy.  Impressions:  - Normal LV ejection fraction, mild aortic stenosis.   Nm Myocar Multi W/spect W/wall Motion / Ef 10/05/2015   There was no ST segment deviation noted during stress.  Defect 1: There is a medium defect of mild severity present in the mid anteroseptal, mid inferoseptal and apical septal location.  This is a low risk study.  The study is normal.  The left ventricular ejection fraction is normal (55-65%).  Nuclear stress EF: 57%. Low risk stress nuclear study with LBBB-related perfusion artifact, otherwise normal perfusion and normal left ventricular regional and global systolic function.   CTA Neck (10/09/15) Advanced atherosclerotic plaque of the thoracic aorta and distal aortic arch.  50%  diameter stenosis proximal right internal carotid artery  Irregular plaque of the distal left common carotid artery with plaque ulceration. Very irregular plaque involving the proximal left internal carotid artery. Critical 85% stenosis of the proximal left internal carotid artery  Mild atherosclerotic disease in the right  vertebral artery without significant vertebral artery stenosis.  Heterogeneous thyroid bilaterally with 11 mm right lower pole nodule. Recommend thyroid ultrasound.    Assessment/Plan:  1.Labile HTN: recheck by me is down to 144/60. No changes made today. She has not been interested in further medicines for her BP or for checking BP at home. I think she is doing ok - no changes made today.  2.Carotid artery diease w/high-grade left ICA stenosis with plaque ulceration:s/p CEA last year 6979 which was complicated by a left vocal cord paralysis. Noted severe atherosclerotic burden in the arch with concerns for possible embolism.She is followed by [facility].   3.HLD:on statin therapy - lab from last month reviewed.   4. COPD/Pulmonary nodule(noted to be likely benign):she has had recent admission for exacerbation - she continues to follow with pulmonary  5.Hypothyroidism: Most recent TSh was ok.   6.Previous CVA: Continue aspirin and statin.  7.Anxiety:always an issue - this seems better today  8. Swelling- chronic - most certainly gets too much salt - it will go down overnight. No changes made today.  14. Advanced age - doing quite well overall.  Current medicines are reviewed with the patient today.  The patient does not have concerns regarding medicines other than what has been noted above.  The following changes have been made:  See above.  Labs/ tests ordered today include:   No orders of the defined types were placed in this encounter.    Disposition:   FU with me in 6 months.   Patient is agreeable to this plan and will call  if any problems develop in the interim.   SignedTruitt Merle, NP  02/23/2018 3:03 PM  [facility] [address] [place], [place]  [address] Phone: [phone] Fax: [phone]

## 2018-02-24 ENCOUNTER — Ambulatory Visit: Payer: Medicare Other | Admitting: Nurse Practitioner

## 2018-03-02 ENCOUNTER — Ambulatory Visit: Payer: Medicare Other | Admitting: Acute Care

## 2018-03-02 ENCOUNTER — Encounter (HOSPITAL_COMMUNITY): Payer: Self-pay | Admitting: Emergency Medicine

## 2018-03-02 ENCOUNTER — Observation Stay (HOSPITAL_COMMUNITY)
Admission: EM | Admit: 2018-03-02 | Discharge: 2018-03-04 | Disposition: A | Discharge status: Home / Self Care | Payer: Medicare Other | Attending: Internal Medicine | Admitting: Internal Medicine

## 2018-03-02 ENCOUNTER — Other Ambulatory Visit: Payer: Self-pay

## 2018-03-02 ENCOUNTER — Emergency Department (HOSPITAL_COMMUNITY): Payer: Medicare Other

## 2018-03-02 DIAGNOSIS — Z8673 Personal history of transient ischemic attack (TIA), and cerebral infarction without residual deficits: Secondary | ICD-10-CM | POA: Insufficient documentation

## 2018-03-02 DIAGNOSIS — E785 Hyperlipidemia, unspecified: Secondary | ICD-10-CM | POA: Insufficient documentation

## 2018-03-02 DIAGNOSIS — I129 Hypertensive chronic kidney disease with stage 1 through stage 4 chronic kidney disease, or unspecified chronic kidney disease: Secondary | ICD-10-CM | POA: Diagnosis not present

## 2018-03-02 DIAGNOSIS — Z87891 Personal history of nicotine dependence: Secondary | ICD-10-CM | POA: Insufficient documentation

## 2018-03-02 DIAGNOSIS — J9621 Acute and chronic respiratory failure with hypoxia: Principal | ICD-10-CM | POA: Insufficient documentation

## 2018-03-02 DIAGNOSIS — E782 Mixed hyperlipidemia: Secondary | ICD-10-CM | POA: Diagnosis present

## 2018-03-02 DIAGNOSIS — J441 Chronic obstructive pulmonary disease with (acute) exacerbation: Secondary | ICD-10-CM | POA: Diagnosis not present

## 2018-03-02 DIAGNOSIS — I10 Essential (primary) hypertension: Secondary | ICD-10-CM | POA: Diagnosis present

## 2018-03-02 DIAGNOSIS — N183 Chronic kidney disease, stage 3 (moderate): Secondary | ICD-10-CM | POA: Insufficient documentation

## 2018-03-02 DIAGNOSIS — Z79899 Other long term (current) drug therapy: Secondary | ICD-10-CM | POA: Insufficient documentation

## 2018-03-02 DIAGNOSIS — Z79890 Hormone replacement therapy (postmenopausal): Secondary | ICD-10-CM | POA: Diagnosis not present

## 2018-03-02 DIAGNOSIS — R7302 Impaired glucose tolerance (oral): Secondary | ICD-10-CM | POA: Diagnosis present

## 2018-03-02 DIAGNOSIS — R0602 Shortness of breath: Secondary | ICD-10-CM | POA: Insufficient documentation

## 2018-03-02 DIAGNOSIS — E039 Hypothyroidism, unspecified: Secondary | ICD-10-CM | POA: Diagnosis present

## 2018-03-02 LAB — CBC WITH DIFFERENTIAL/PLATELET
Abs Immature Granulocytes: 0 10*3/uL (ref 0.0–0.1)
Basophils Absolute: 0.1 10*3/uL (ref 0.0–0.1)
Basophils Relative: 1 %
EOS ABS: 0.3 10*3/uL (ref 0.0–0.7)
Eosinophils Relative: 6 %
HEMATOCRIT: 41.5 % (ref 36.0–46.0)
Hemoglobin: 13.3 g/dL (ref 12.0–15.0)
IMMATURE GRANULOCYTES: 0 %
LYMPHS ABS: 1.4 10*3/uL (ref 0.7–4.0)
LYMPHS PCT: 24 %
MCH: 30.6 pg (ref 26.0–34.0)
MCHC: 32 g/dL (ref 30.0–36.0)
MCV: 95.6 fL (ref 78.0–100.0)
Monocytes Absolute: 0.7 10*3/uL (ref 0.1–1.0)
Monocytes Relative: 13 %
NEUTROS PCT: 56 %
Neutro Abs: 3.2 10*3/uL (ref 1.7–7.7)
Platelets: 206 10*3/uL (ref 150–400)
RBC: 4.34 MIL/uL (ref 3.87–5.11)
RDW: 12.9 % (ref 11.5–15.5)
WBC: 5.6 10*3/uL (ref 4.0–10.5)

## 2018-03-02 LAB — COMPREHENSIVE METABOLIC PANEL
ALBUMIN: 3.5 g/dL (ref 3.5–5.0)
ALT: 16 U/L (ref 0–44)
AST: 21 U/L (ref 15–41)
Alkaline Phosphatase: 140 U/L — ABNORMAL HIGH (ref 38–126)
Anion gap: 9 (ref 5–15)
BILIRUBIN TOTAL: 0.9 mg/dL (ref 0.3–1.2)
BUN: 14 mg/dL (ref 8–23)
CALCIUM: 9 mg/dL (ref 8.9–10.3)
CHLORIDE: 104 mmol/L (ref 98–111)
CO2: 25 mmol/L (ref 22–32)
Creatinine, Ser: 1.2 mg/dL — ABNORMAL HIGH (ref 0.44–1.00)
GFR calc Af Amer: 45 mL/min — ABNORMAL LOW (ref >60)
GFR calc non Af Amer: 39 mL/min — ABNORMAL LOW (ref >60)
GLUCOSE: 138 mg/dL — AB (ref 70–99)
POTASSIUM: 3.9 mmol/L (ref 3.5–5.1)
Sodium: 138 mmol/L (ref 135–145)
TOTAL PROTEIN: 6.4 g/dL — AB (ref 6.5–8.1)

## 2018-03-02 LAB — I-STAT TROPONIN, ED
TROPONIN I, POC: 0.02 ng/mL (ref 0.00–0.08)
Troponin i, poc: 0.03 ng/mL (ref 0.00–0.08)

## 2018-03-02 LAB — TSH: TSH: 0.616 u[IU]/mL (ref 0.350–4.500)

## 2018-03-02 LAB — URINALYSIS, ROUTINE W REFLEX MICROSCOPIC
Bilirubin Urine: NEGATIVE
Glucose, UA: NEGATIVE mg/dL
Hgb urine dipstick: NEGATIVE
Ketones, ur: NEGATIVE mg/dL
LEUKOCYTES UA: NEGATIVE
NITRITE: NEGATIVE
PROTEIN: NEGATIVE mg/dL
SPECIFIC GRAVITY, URINE: 1.01 (ref 1.005–1.030)
pH: 6 (ref 5.0–8.0)

## 2018-03-02 LAB — BRAIN NATRIURETIC PEPTIDE: B NATRIURETIC PEPTIDE 5: 81.9 pg/mL (ref 0.0–100.0)

## 2018-03-02 MED ORDER — ENOXAPARIN SODIUM 30 MG/0.3ML ~~LOC~~ SOLN
30.0000 mg | SUBCUTANEOUS | Status: DC
  Administered 2018-03-02 – 2018-03-03 (×2): 30 mg via SUBCUTANEOUS
  Filled 2018-03-02 (×2): qty 0.3

## 2018-03-02 MED ORDER — METHYLPREDNISOLONE SODIUM SUCC 125 MG IJ SOLR
125.0000 mg | Freq: Once | INTRAMUSCULAR | Status: AC
  Administered 2018-03-02: 125 mg via INTRAVENOUS
  Filled 2018-03-02: qty 2

## 2018-03-02 MED ORDER — ONDANSETRON HCL 4 MG/2ML IJ SOLN: 4.0000 mg | Freq: Four times a day (QID) | INTRAMUSCULAR | Status: DC | PRN

## 2018-03-02 MED ORDER — IPRATROPIUM-ALBUTEROL 0.5-2.5 (3) MG/3ML IN SOLN
3.0000 mL | Freq: Four times a day (QID) | RESPIRATORY_TRACT | Status: DC
  Administered 2018-03-02: 3 mL via RESPIRATORY_TRACT
  Filled 2018-03-02: qty 3

## 2018-03-02 MED ORDER — HYDRALAZINE HCL 50 MG PO TABS
75.0000 mg | ORAL_TABLET | Freq: Three times a day (TID) | ORAL | Status: DC
  Administered 2018-03-02 – 2018-03-04 (×6): 75 mg via ORAL
  Filled 2018-03-02 (×6): qty 1

## 2018-03-02 MED ORDER — ASPIRIN EC 81 MG PO TBEC
81.0000 mg | DELAYED_RELEASE_TABLET | Freq: Every day | ORAL | Status: DC
  Administered 2018-03-03 – 2018-03-04 (×2): 81 mg via ORAL
  Filled 2018-03-02 (×2): qty 1

## 2018-03-02 MED ORDER — PREDNISONE 20 MG PO TABS
40.0000 mg | ORAL_TABLET | Freq: Every day | ORAL | Status: DC
  Administered 2018-03-03 – 2018-03-04 (×2): 40 mg via ORAL
  Filled 2018-03-02 (×2): qty 2

## 2018-03-02 MED ORDER — METHYLPREDNISOLONE SODIUM SUCC 125 MG IJ SOLR
60.0000 mg | Freq: Two times a day (BID) | INTRAMUSCULAR | Status: AC
  Administered 2018-03-02 – 2018-03-03 (×2): 60 mg via INTRAVENOUS
  Filled 2018-03-02 (×2): qty 2

## 2018-03-02 MED ORDER — DOCUSATE SODIUM 100 MG PO CAPS
100.0000 mg | ORAL_CAPSULE | Freq: Two times a day (BID) | ORAL | Status: DC
  Administered 2018-03-03 – 2018-03-04 (×2): 100 mg via ORAL
  Filled 2018-03-02 (×4): qty 1

## 2018-03-02 MED ORDER — ACETAMINOPHEN 650 MG RE SUPP: 650.0000 mg | Freq: Four times a day (QID) | RECTAL | Status: DC | PRN

## 2018-03-02 MED ORDER — AMLODIPINE BESYLATE 10 MG PO TABS
10.0000 mg | ORAL_TABLET | Freq: Every day | ORAL | Status: DC
  Administered 2018-03-03 – 2018-03-04 (×2): 10 mg via ORAL
  Filled 2018-03-02 (×2): qty 1

## 2018-03-02 MED ORDER — IPRATROPIUM-ALBUTEROL 0.5-2.5 (3) MG/3ML IN SOLN
3.0000 mL | Freq: Once | RESPIRATORY_TRACT | Status: AC
  Administered 2018-03-02: 3 mL via RESPIRATORY_TRACT
  Filled 2018-03-02: qty 3

## 2018-03-02 MED ORDER — IPRATROPIUM-ALBUTEROL 0.5-2.5 (3) MG/3ML IN SOLN
3.0000 mL | Freq: Three times a day (TID) | RESPIRATORY_TRACT | Status: DC
  Administered 2018-03-03 – 2018-03-04 (×4): 3 mL via RESPIRATORY_TRACT
  Filled 2018-03-02 (×4): qty 3

## 2018-03-02 MED ORDER — ALBUTEROL SULFATE (2.5 MG/3ML) 0.083% IN NEBU: 2.5000 mg | INHALATION_SOLUTION | RESPIRATORY_TRACT | Status: DC | PRN

## 2018-03-02 MED ORDER — ONDANSETRON HCL 4 MG PO TABS: 4.0000 mg | ORAL_TABLET | Freq: Four times a day (QID) | ORAL | Status: DC | PRN

## 2018-03-02 MED ORDER — LEVOTHYROXINE SODIUM 75 MCG PO TABS
75.0000 ug | ORAL_TABLET | Freq: Every day | ORAL | Status: DC
  Administered 2018-03-03 – 2018-03-04 (×2): 75 ug via ORAL
  Filled 2018-03-02 (×2): qty 1

## 2018-03-02 MED ORDER — ATORVASTATIN CALCIUM 80 MG PO TABS
80.0000 mg | ORAL_TABLET | Freq: Every day | ORAL | Status: DC
  Administered 2018-03-02 – 2018-03-03 (×2): 80 mg via ORAL
  Filled 2018-03-02 (×2): qty 1

## 2018-03-02 MED ORDER — GLYCERIN-HYPROMELLOSE-PEG 400 0.2-0.2-1 % OP SOLN: 1.0000 [drp] | OPHTHALMIC | Status: DC | PRN

## 2018-03-02 MED ORDER — ACETAMINOPHEN 325 MG PO TABS: 650.0000 mg | ORAL_TABLET | Freq: Four times a day (QID) | ORAL | Status: DC | PRN

## 2018-03-02 NOTE — ED Notes (Signed)
Admitting MD at bedside.

## 2018-03-02 NOTE — ED Notes (Addendum)
Patient ambulated to restroom maintaining oxygen saturation of 94% or greater however felt very short of breath. Patient attempted to provide urine specimen, but missed the cup.

## 2018-03-02 NOTE — ED Triage Notes (Signed)
Pt here from home with c/o sob , pt was doing her own  neb on ems arrival . Ems gave 10 of albuterol and 1 of Atrovent.[name]

## 2018-03-02 NOTE — ED Provider Notes (Signed)
[facility] EMERGENCY DEPARTMENT Provider Note   CSN: [id number] Arrival date & time: 03/02/18  0901     History   Chief Complaint Chief Complaint  Patient presents with  . Shortness of Breath    HPI [name] is a 84 y.o. female.  The history is provided by the patient and medical records. No language interpreter was used.  Shortness of Breath  This is a new problem. The average episode lasts 3 days. The problem occurs intermittently.The current episode started more than 2 days ago. The problem has been gradually improving. Associated symptoms include cough and wheezing. Pertinent negatives include no fever, no headaches, no coryza, no rhinorrhea, no neck pain, no sputum production, no hemoptysis, no chest pain, no syncope, no vomiting, no abdominal pain, no rash, no leg pain and no leg swelling. She has tried beta-agonist inhalers for the symptoms. The treatment provided no relief. Associated medical issues include COPD. Associated medical issues do not include pneumonia, PE, CAD, past MI or DVT.    Past Medical History:  Diagnosis Date  . Anxiety   . Aortic arch atherosclerosis ([place]) 06/24/2014  . Atherosclerotic ulcer of aorta ([place]) 06/24/2014  . Carotid artery occlusion   . [place] DISEASE, LUMBAR 12/16/2008  . DIVERTICULOSIS, COLON 09/30/2008  . DYSPNEA 07/13/2008  . Graves disease   . History of embolic stroke 1/51/7616   Left brain  . HYPERLIPIDEMIA 03/06/2007  . HYPERTENSION 03/06/2007  . HYPOTHYROIDISM 10/13/2007  . OSTEOARTHRITIS 03/06/2007  . Personal history of colonic polyps 09/30/2008  . Stroke ([place])    06/2014           . TIA (transient ischemic attack)   . TOBACCO USE, QUIT 04/12/2009  . WEAKNESS 11/09/2007    Patient Active Problem List   Diagnosis Date Noted  . SOB (shortness of breath) 01/12/2018  . HLD (hyperlipidemia) 01/12/2018  . CKD (chronic kidney disease), stage III ([place]) 01/12/2018  . New onset left bundle branch block (LBBB)  01/12/2018  . COPD (chronic obstructive pulmonary disease) ([place]) 01/12/2018  . Left buttock pain 09/13/2017  . Symptomatic anemia   . AVM (arteriovenous malformation) of duodenum, acquired with hemorrhage   . Acute GI bleeding 10/09/2016  . Other emphysema ([place]) 05/10/2016  . Recurrent laryngeal neuropathy 02/01/2016  . Nausea with vomiting 12/11/2015  . Left carotid stenosis 11/21/2015  . Asymptomatic carotid artery stenosis 11/13/2015  . Impaired glucose tolerance 10/27/2015  . Solitary pulmonary nodule 10/27/2015  . Thyroid nodule 10/27/2015  . Syncope 10/04/2015  . Bradycardia with less than 60 beats per minute 10/04/2015  . Paresthesia 10/03/2014  . History of embolic stroke 07/37/1062  . Paresthesia of both feet 07/06/2014  . Aortic arch atherosclerosis ([place]) 06/24/2014  . Atherosclerotic ulcer of aorta ([place]) 06/24/2014  . Stroke ([place]) 06/22/2014  . Stroke (cerebrum) ([place]) 06/22/2014  . Bilateral carotid artery disease ([place]) 05/18/2014  . TOBACCO USE, QUIT 04/12/2009  . [place] DISEASE, LUMBAR 12/16/2008  . DIVERTICULOSIS, COLON 09/30/2008  . Personal history of colonic polyps 09/30/2008  . DYSPNEA 07/13/2008  . Weakness 11/09/2007  . Hypothyroidism 10/13/2007  . Hyperlipidemia 03/06/2007  . Essential hypertension 03/06/2007  . Osteoarthritis 03/06/2007    Past Surgical History:  Procedure Laterality Date  . CARDIAC CATHETERIZATION    . CATARACT EXTRACTION Bilateral   . CHOLECYSTECTOMY    . ENDARTERECTOMY Left 11/13/2015   Procedure: LEFT CAROTID ARTERY ENDARTERECTOMY;  Surgeon: Conrad Preston, MD;  Location: [facility];  Service: Vascular;  Laterality:  Left;  . ESOPHAGOGASTRODUODENOSCOPY (EGD) WITH PROPOFOL N/A 10/11/2016   Procedure: ESOPHAGOGASTRODUODENOSCOPY (EGD) WITH PROPOFOL;  Surgeon: Mauri Pole, MD;  Location: [facility];  Service: Endoscopy;  Laterality: N/A;  . KNEE SURGERY    . PATCH ANGIOPLASTY Left 11/13/2015   Procedure: WITH 1CM X 6CM  XENOSURE BIOLOGIC  PATCH ANGIOPLASTY;  Surgeon: Conrad Matfield Green, MD;  Location: [facility];  Service: Vascular;  Laterality: Left;  . TEE WITHOUT CARDIOVERSION N/A 06/24/2014   Procedure: TRANSESOPHAGEAL ECHOCARDIOGRAM (TEE);  Surgeon: Sanda Klein, MD;  Location: [facility] ENDOSCOPY;  Service: Cardiovascular;  Laterality: N/A;     OB History   None      Home Medications    Prior to Admission medications   Medication Sig Start Date End Date Taking? Authorizing Provider  albuterol (PROAIR HFA) 108 (90 Base) MCG/ACT inhaler Inhale 2 puffs into the lungs every 6 (six) hours as needed for wheezing or shortness of breath. 11/13/17   Volanda Napoleon, PA-C  albuterol (PROVENTIL) (2.5 MG/3ML) 0.083% nebulizer solution Take 3 mLs (2.5 mg total) by nebulization every 6 (six) hours as needed for wheezing or shortness of breath. 11/13/17   Volanda Napoleon, PA-C  amLODipine (NORVASC) 10 MG tablet Take 1 tablet (10 mg total) by mouth daily. 02/23/18   Burtis Junes, NP  aspirin EC 81 MG tablet Take 1 tablet (81 mg total) by mouth daily. 11/23/15   Alvia Grove, PA-C  atorvastatin (LIPITOR) 80 MG tablet TAKE 1 TABLET BY MOUTH EVERY DAY AT 6PM 08/05/17   Marletta Lor, MD  Glycerin-Hypromellose-PEG 400 ([employer] DRY EYE RELIEF) 0.2-0.2-1 % SOLN Place 1 drop into both eyes as needed.    [provider]  hydrALAZINE (APRESOLINE) 25 MG tablet TAKE 3 TABLETS BY MOUTH 3 TIMES A DAY 11/17/17   Josue Hector, MD  levothyroxine (SYNTHROID, LEVOTHROID) 75 MCG tablet TAKE 1 TABLET BY MOUTH EVERY DAY 10/20/17   Marletta Lor, MD  Spacer/Aero-Holding Chambers (AEROCHAMBER MV) inhaler Use as instructed 11/26/17   Magdalen Spatz, NP    Family History Family History  Problem Relation Age of Onset  . Stomach cancer Maternal Grandmother   . Colon cancer Neg Hx     Social History Social History   Tobacco Use  . Smoking status: Former Smoker    Last attempt to quit: 07/08/1978    Years since quitting: 39.6  . Smokeless  tobacco: Never Used  Substance Use Topics  . Alcohol use: Yes    Comment: "red wine"- some nights  . Drug use: No     Allergies   Catapres [clonidine hcl]; Lisinopril; and Labetalol hcl   Review of Systems Review of Systems  Constitutional: Negative for chills, diaphoresis, fatigue and fever.  HENT: Negative for congestion and rhinorrhea.   Eyes: Negative for visual disturbance.  Respiratory: Positive for cough, chest tightness, shortness of breath and wheezing. Negative for hemoptysis, sputum production, choking and stridor.   Cardiovascular: Negative for chest pain, leg swelling and syncope.  Gastrointestinal: Negative for abdominal pain, constipation, diarrhea, nausea and vomiting.  Genitourinary: Negative for flank pain.  Musculoskeletal: Negative for back pain and neck pain.  Skin: Negative for rash and wound.  Neurological: Negative for light-headedness and headaches.  Psychiatric/Behavioral: Negative for agitation.  All other systems reviewed and are negative.    Physical Exam Updated Vital Signs BP (!) 169/79 (BP Location: Left Arm)   Temp 98 F (36.7 C) (Axillary)   Resp (!) 22  SpO2 99%   Physical Exam  Constitutional: She is oriented to person, place, and time. She appears well-developed and well-nourished.  Non-toxic appearance. She does not appear ill. No distress.  HENT:  Head: Normocephalic and atraumatic.  Nose: Nose normal.  Mouth/Throat: Oropharynx is clear and moist. No oropharyngeal exudate.  Eyes: Pupils are equal, round, and reactive to light. Conjunctivae and EOM are normal.  Neck: Neck supple.  Pulmonary/Chest: Effort normal. Tachypnea noted. No respiratory distress. She has no decreased breath sounds. She has no wheezes. She has rhonchi in the right lower field and the left lower field. She has no rales. She exhibits no tenderness.  Abdominal: Soft. There is no tenderness.  Musculoskeletal: She exhibits no edema or tenderness.  Neurological:  She is alert and oriented to person, place, and time. No sensory deficit. She exhibits normal muscle tone.  Skin: Capillary refill takes less than 2 seconds. No rash noted. She is not diaphoretic. No erythema.  Nursing note and vitals reviewed.    ED Treatments / Results  Labs (all labs ordered are listed, but only abnormal results are displayed) Labs Reviewed  COMPREHENSIVE METABOLIC PANEL - Abnormal; Notable for the following components:      Result Value   Glucose, Bld 138 (*)    Creatinine, Ser 1.20 (*)    Total Protein 6.4 (*)    Alkaline Phosphatase 140 (*)    GFR calc non Af Amer 39 (*)    GFR calc Af Amer 45 (*)    All other components within normal limits  URINE CULTURE  CBC WITH DIFFERENTIAL/PLATELET  BRAIN NATRIURETIC PEPTIDE  URINALYSIS, ROUTINE W REFLEX MICROSCOPIC  I-STAT TROPONIN, ED  I-STAT TROPONIN, ED    EKG EKG Interpretation  Date/Time:  Monday March 02 2018 09:08:12 EDT Ventricular Rate:  86 PR Interval:    QRS Duration: 86 QT Interval:  379 QTC Calculation: 454 R Axis:   83 Text Interpretation:  Sinus rhythm Ventricular premature complex Anterior infarct, old Borderline T abnormalities, inferior leads When compared to prior ECG no significant changes sen.  No STEMI Confirmed by Antony Blackbird [phone]) on 03/02/2018 9:15:08 AM   Radiology Dg Chest 2 View  Result Date: 03/02/2018 CLINICAL DATA:  Cough and shortness of Breath EXAM: CHEST - 2 VIEW COMPARISON:  01/12/2018 FINDINGS: Cardiac shadow is within normal limits. The lungs are hyperinflated consistent with COPD. Chronic blunting of left costophrenic angle noted. No focal infiltrate or sizable effusion is seen. Degenerative changes of the thoracic spine are noted. IMPRESSION: COPD without acute abnormality. Electronically Signed   By: Inez Catalina M.D.   On: 03/02/2018 10:14    Procedures Procedures (including critical care time)  Medications Ordered in ED Medications  ipratropium-albuterol  (DUONEB) 0.5-2.5 (3) MG/3ML nebulizer solution 3 mL (3 mLs Nebulization Given 03/02/18 1431)  methylPREDNISolone sodium succinate (SOLU-MEDROL) 125 mg/2 mL injection 125 mg (125 mg Intravenous Given 03/02/18 1434)     Initial Impression / Assessment and Plan / ED Course  I have reviewed the triage vital signs and the nursing notes.  Pertinent labs & imaging results that were available during my care of the patient were reviewed by me and considered in my medical decision making (see chart for details).     [name] is a 84 y.o. female with a past medical history significant for COPD, prior stroke, seizures, hypothyroidism, hypertension, hyperlipidemia, carotid artery disease status post endarterectomy, and Graves' disease who presents with cough and shortness of breath.  Patient reports  that she has had a cough over the last several days and has had worsening shortness of breath.  She reports that she tried her breathing treatment which did not alleviate her severe shortness of breath.  EMS reported she had significant wheezing and she received albuterol in route.  Patient has not yet had steroids were needed magnesium during transportation.  Patient denies any chest pain, palpitations or abdominal pain.  She denies recent injuries.  She denies fevers or chills but does report she has had some decrease in her urination.  She denies any neck pain, neck stiffness, or back pain.  Her primary concern is her shortness of breath.  On exam, patient has some coarseness in the bases bilaterally.  No significant wheezing was heard however patient just finished her breathing treatment.  We will plan to reassess patient's lungs to determine if she has recurrent wheezing.  Patient's legs are nonedematous and patient otherwise appears well.  After the breathing treatment was stopped and patient was on room air she began feeling more short of breath.  She was placed on nasal cannula oxygen.  Next  Given the  patient's cough, will look for pneumonia or other significant normality contribute to her shortness of breath.  If work-up is reassuring, patient will be ambulated to see if she has hypoxia.  If she becomes hypoxic as she does not use home oxygen, she will likely need admission.  Patient's x-ray did not show pneumonia however patient continues to have respiratory distress with walking and still had a new oxygen requirement.  She was given a second breathing treatment and steroids.  Patient will be admitted for COPD exacerbation.    Final Clinical Impressions(s) / ED Diagnoses   Final diagnoses:  COPD exacerbation ([place])   \ Clinical Impression: 1. COPD exacerbation ([place])     Disposition: Admit  This note was prepared with assistance of Dragon voice recognition software. Occasional wrong-word or sound-a-like substitutions may have occurred due to the inherent limitations of voice recognition software.      Tegeler, Gwenyth Allegra, MD 03/02/18 [phone]

## 2018-03-02 NOTE — H&P (Signed)
History and Physical    [name] NFA:[id number] DOB: Apr 19, 1925 DOA: 03/02/2018  PCP: [name], NP Consultants:  Servando Snare - cardiology; [facility] - pulmonology; Bridgett Larsson - vascular Patient coming from:  Home - lives with alone; NOK: Daughters, [phone], [phone]  Chief Complaint: SOB  HPI: [name] is a 84 y.o. female with medical history significant of COPD; hypothyroidism; HTN; HLD; and CVA presenting with SOB.   I can't breathe.  Due to COPD.  She has noticed difficulty with SOB when she gets up in the mornings.  She has been coughing too.  This AM, her neck was stiff - although this has resolved.  Cough is nonproductive.  She has to clear her throat all the time.  No fever.  +wheezing  SOB is with ambulation at all.  She does not have trouble at night sleeping and does not fel SOB.  She noticed an improvement with Duonebs, but she only has albuterol at home.  She has been using Trelegy (inhaler with 3 different medications, prescribed by pulm in early August) every morning - she noticed an improvement on this medication initially but then none recently.  She is using albuterol nebs 1-2 times daily.      ED Course:   Exertional SOB, not improving with home meds.  H/o COPD, not on home O2.  Troponin negative, BNP ok, EKG ok.  Still wheezing, still with DOE.  Ordered nebs, solumedrol.  Review of Systems: As per HPI; otherwise review of systems reviewed and negative.   Ambulatory Status:  Ambulates without assistance  Past Medical History:  Diagnosis Date  . Anxiety   . Aortic arch atherosclerosis ([place]) 06/24/2014  . Atherosclerotic ulcer of aorta ([place]) 06/24/2014  . Carotid artery occlusion   . [place] DISEASE, LUMBAR 12/16/2008  . DIVERTICULOSIS, COLON 09/30/2008  . DYSPNEA 07/13/2008  . Graves disease   . History of embolic stroke 2/53/6644   Left brain  . HYPERLIPIDEMIA 03/06/2007  . HYPERTENSION 03/06/2007  . HYPOTHYROIDISM 10/13/2007  . OSTEOARTHRITIS 03/06/2007  .  Personal history of colonic polyps 09/30/2008  . Stroke ([place])    06/2014           . TOBACCO USE, QUIT 04/12/2009  . WEAKNESS 11/09/2007    Past Surgical History:  Procedure Laterality Date  . CARDIAC CATHETERIZATION    . CATARACT EXTRACTION Bilateral   . CHOLECYSTECTOMY    . ENDARTERECTOMY Left 11/13/2015   Procedure: LEFT CAROTID ARTERY ENDARTERECTOMY;  Surgeon: Conrad Malta, MD;  Location: [facility];  Service: Vascular;  Laterality: Left;  . ESOPHAGOGASTRODUODENOSCOPY (EGD) WITH PROPOFOL N/A 10/11/2016   Procedure: ESOPHAGOGASTRODUODENOSCOPY (EGD) WITH PROPOFOL;  Surgeon: Mauri Pole, MD;  [facility];  Service: Endoscopy;  Laterality: N/A;  . KNEE SURGERY    . PATCH ANGIOPLASTY Left 11/13/2015   Procedure: WITH 1CM X 6CM  XENOSURE BIOLOGIC PATCH ANGIOPLASTY;  Surgeon: Conrad Kasaan, MD;  Location: [facility];  Service: Vascular;  Laterality: Left;  . TEE WITHOUT CARDIOVERSION N/A 06/24/2014   Procedure: TRANSESOPHAGEAL ECHOCARDIOGRAM (TEE);  Surgeon: Sanda Klein, MD;  Location: [facility] ENDOSCOPY;  Service: Cardiovascular;  Laterality: N/A;    Social History   Socioeconomic History  . Marital status: Divorced    Spouse name: Not on file  . Number of children: 4  . Years of education: college  . Highest education level: Not on file  Occupational History  . Occupation: retired  Scientific laboratory technician  . Financial resource strain: Not on file  .  Food insecurity:    Worry: Not on file    Inability: Not on file  . Transportation needs:    Medical: Not on file    Non-medical: Not on file  Tobacco Use  . Smoking status: Former Smoker    Packs/day: 1.50    Years: 29.00    Pack years: 43.50    Types: Cigarettes    Start date: 07/08/1949    Last attempt to quit: 07/08/1978    Years since quitting: 39.6  . Smokeless tobacco: Never Used  Substance and Sexual Activity  . Alcohol use: Yes    Alcohol/week: 1.0 standard drinks    Types: 1 Glasses of wine per week    Comment: "red wine"- some  nights  . Drug use: No  . Sexual activity: Not on file  Lifestyle  . Physical activity:    Days per week: Not on file    Minutes per session: Not on file  . Stress: Not on file  Relationships  . Social connections:    Talks on phone: Not on file    Gets together: Not on file    Attends religious service: Not on file    Active member of club or organization: Not on file    Attends meetings of clubs or organizations: Not on file    Relationship status: Not on file  . Intimate partner violence:    Fear of current or ex partner: Not on file    Emotionally abused: Not on file    Physically abused: Not on file    Forced sexual activity: Not on file  Other Topics Concern  . Not on file  Social History Narrative      Patient is right handed.   Patient drinks 1 cup caffeine daily.    Allergies  Allergen Reactions  . Catapres [Clonidine Hcl] Other (See Comments)    Made pt feel horrible, shaky, weak and nausea  . Lisinopril Anaphylaxis    Tongue swelling  . Labetalol Hcl Other (See Comments)    Caused bradycardia and syncope    Family History  Problem Relation Age of Onset  . Stomach cancer Maternal Grandmother   . Colon cancer Neg Hx     Prior to Admission medications   Medication Sig Start Date End Date Taking? Authorizing Provider  albuterol (PROAIR HFA) 108 (90 Base) MCG/ACT inhaler Inhale 2 puffs into the lungs every 6 (six) hours as needed for wheezing or shortness of breath. 11/13/17  Yes Providence Lanius A, PA-C  albuterol (PROVENTIL) (2.5 MG/3ML) 0.083% nebulizer solution Take 3 mLs (2.5 mg total) by nebulization every 6 (six) hours as needed for wheezing or shortness of breath. 11/13/17  Yes Providence Lanius A, PA-C  amLODipine (NORVASC) 10 MG tablet Take 1 tablet (10 mg total) by mouth daily. 02/23/18  Yes Burtis Junes, NP  aspirin EC 81 MG tablet Take 1 tablet (81 mg total) by mouth daily. 11/23/15  Yes Virgina Jock A, PA-C  atorvastatin (LIPITOR) 80 MG tablet TAKE  1 TABLET BY MOUTH EVERY DAY AT 6PM Patient taking differently: Take 80 mg by mouth daily at 6 PM.  08/05/17  Yes Marletta Lor, MD  Glycerin-Hypromellose-PEG 400 ([facility] DRY EYE RELIEF) 0.2-0.2-1 % SOLN Place 1 drop into both eyes as needed.   Yes [provider]  hydrALAZINE (APRESOLINE) 25 MG tablet TAKE 3 TABLETS BY MOUTH 3 TIMES A DAY Patient taking differently: Take 75 mg by mouth 3 (three) times daily.  11/17/17  Yes Josue Hector, MD  levothyroxine (SYNTHROID, LEVOTHROID) 75 MCG tablet TAKE 1 TABLET BY MOUTH EVERY DAY Patient taking differently: Take 75 mcg by mouth daily before breakfast.  10/20/17  Yes Marletta Lor, MD  Spacer/Aero-Holding Chambers (AEROCHAMBER MV) inhaler Use as instructed 11/26/17   Magdalen Spatz, NP    Physical Exam: Vitals:   03/02/18 1430 03/02/18 1500 03/02/18 1530 03/02/18 1637  BP: 137/62 (!) 141/57 138/81 (!) 150/79  Pulse: 79 78 87 91  Resp: 17 (!) 21 (!) 22 18  Temp:    (!) 97.5 F (36.4 C)  TempSrc:    Oral  SpO2: 99% 99% 98% 98%  Weight:      Height:         General:  Appears calm and comfortable and is NAD Eyes:  PERRL, EOMI, normal lids, iris ENT: hard of hearing, normal lips & tongue, mmm; dentures in place Neck:  no LAD, masses or thyromegaly; no carotid bruits Cardiovascular:  RRR, no m/r/g. No LE edema.  Respiratory:   CTA bilaterally with no wheezes/rales/rhonchi with scattered wheezes.  Normal respiratory effort on [place] O2. Abdomen:  soft, NT, ND, NABS Back:   normal alignment, no CVAT Skin:  no rash or induration seen on limited exam Musculoskeletal:  grossly normal tone BUE/BLE, good ROM, no bony abnormality Psychiatric:  grossly normal mood and affect, speech fluent and appropriate, AOx3 Neurologic:  CN 2-12 grossly intact, moves all extremities in coordinated fashion, sensation intact    Radiological Exams on Admission: Dg Chest 2 View  Result Date: 03/02/2018 CLINICAL DATA:  Cough and shortness of Breath  EXAM: CHEST - 2 VIEW COMPARISON:  01/12/2018 FINDINGS: Cardiac shadow is within normal limits. The lungs are hyperinflated consistent with COPD. Chronic blunting of left costophrenic angle noted. No focal infiltrate or sizable effusion is seen. Degenerative changes of the thoracic spine are noted. IMPRESSION: COPD without acute abnormality. Electronically Signed   By: Inez Catalina M.D.   On: 03/02/2018 10:14    EKG: Independently reviewed.  NSR with rate 86; nonspecific ST changes with no evidence of acute ischemia   Labs on Admission: I have personally reviewed the available labs and imaging studies at the time of the admission.  Pertinent labs:   Glucose 138 BUN 14/Creatinine 1.20/GFR 39 AP 140 BNP 81.9 Troponin 0.03, 0.02 Normal CBC Normal UA  Assessment/Plan Principal Problem:   COPD with acute exacerbation (HCC) Active Problems:   Hypothyroidism   Hyperlipidemia   Essential hypertension   Impaired glucose tolerance    Acute on chronic respiratory failure associated with uncontrolled COPD +/- exacerbation -Patient's shortness of breath and intermittent cough are most likely caused by uncontrolled COPD -By her description, she has not had recent (months) good control despite having seen pulm in early August and being started on Trelegy -She does report improved symptoms with use of Duonebs - but at home has only been using albuterol, and only q6h prn since that was how it was prescribed -Her current issues may be more associated with inflammation than true infection, as she only has 1/3 cardinal symptoms and feels much better after just Duonebs in the ER -She does not have fever or leukocytosis. Chest x-ray is not consistent with pneumonia -Will observe patient for now -Nebulizers: scheduled Duoneb and prn albuterol -Solu-Medrol 60 mg IV BID with transition to PO prednisone tomorrow -Consider transition to Spiriva as an outpatient but would suggest standing Duonebs until her  current symptoms show improved control.  HTN -Continue Norvasc, hydralazine  HLD -Continue Lipitor  Hypothyroidism -Check TSH -Continue Synthroid at current dose for now  Impaired glucose tolerance -Will follow with fasting AM labs -It is unlikely that she will need acute or chronic treatment for this issue   DVT prophylaxis: Lovenox  Code Status:  Full - confirmed with patient/daughter Family Communication: Daughter was present throughout evaluation Disposition Plan:  Home once clinically improved Consults called: CM/Nutrition/RT  Admission status: It is my clinical opinion that referral for OBSERVATION is reasonable and necessary in this patient based on the above information provided. The aforementioned taken together are felt to place the patient at high risk for further clinical deterioration. However it is anticipated that the patient may be medically stable for discharge from the hospital within 24 to 48 hours.    Karmen Bongo MD [facility]  If note is complete, please contact covering daytime or nighttime physician. [email or web address] Password [facility]  03/02/2018, 5:32 PM

## 2018-03-02 NOTE — Telephone Encounter (Signed)
rec'd email from patient's daughter today regarding the below message.  Sent: 8/26/20192:34 PM EDT  To: Marshell Garfinkel, MD Subject: Visit Follow-Up Question  My mother was seen by Dr. Vaughan Browner on 8/2 & gave her a sample of a new inhaler & called in a prescription. She just got the prescription filled 3 days ago.The name of the prescription is not showing up in her chart. She's back in ED today & they are admitting her. Mom thinks the new medicine is causing her breathing problems. ED doctor doesn't know what it is since it's not in her chart. Please update her chart. She will also need a f/u appointment. She is NOT taking her breathing treatments daily because she thought the new medicine replaced the breathing treatments. She needs Dr. Vaughan Browner to insist she does treatments daily if that will help. I'm Jose Persia, her daughter, and I do have medical power of attorney & you should have that on file. Please advise if there is anything else we can do to make sure she is compliant with taking her breathing treatments. My number is [phone]. Thank you.  Recommendations of Dr. Vaughan Browner on last ov on 02/06/2018  Marshell Garfinkel, MD (Physician)    I am glad you are doing well after discharge We will stop the [facility] and start trelegy inhaler We will check your oxygen levels on exertion today Follow-up in 6 months.     Patient was to begin Trelegy one puff QD on 02/06/18 Dr. Vaughan Browner please advise.

## 2018-03-03 DIAGNOSIS — J441 Chronic obstructive pulmonary disease with (acute) exacerbation: Secondary | ICD-10-CM | POA: Diagnosis not present

## 2018-03-03 LAB — BASIC METABOLIC PANEL
ANION GAP: 9 (ref 5–15)
BUN: 23 mg/dL (ref 8–23)
CALCIUM: 9.1 mg/dL (ref 8.9–10.3)
CO2: 24 mmol/L (ref 22–32)
Chloride: 105 mmol/L (ref 98–111)
Creatinine, Ser: 1.5 mg/dL — ABNORMAL HIGH (ref 0.44–1.00)
GFR, EST AFRICAN AMERICAN: 34 mL/min — AB (ref >60)
GFR, EST NON AFRICAN AMERICAN: 29 mL/min — AB (ref >60)
Glucose, Bld: 165 mg/dL — ABNORMAL HIGH (ref 70–99)
Potassium: 4.3 mmol/L (ref 3.5–5.1)
SODIUM: 138 mmol/L (ref 135–145)

## 2018-03-03 LAB — URINE CULTURE

## 2018-03-03 LAB — CBC
HCT: 36.2 % (ref 36.0–46.0)
HEMOGLOBIN: 11.6 g/dL — AB (ref 12.0–15.0)
MCH: 30.5 pg (ref 26.0–34.0)
MCHC: 32 g/dL (ref 30.0–36.0)
MCV: 95.3 fL (ref 78.0–100.0)
Platelets: 186 10*3/uL (ref 150–400)
RBC: 3.8 MIL/uL — AB (ref 3.87–5.11)
RDW: 12.7 % (ref 11.5–15.5)
WBC: 2.2 10*3/uL — AB (ref 4.0–10.5)

## 2018-03-03 MED ORDER — FLUTICASONE FUROATE-VILANTEROL 200-25 MCG/INH IN AEPB
1.0000 | INHALATION_SPRAY | Freq: Every day | RESPIRATORY_TRACT | Status: DC
  Administered 2018-03-03 – 2018-03-04 (×2): 1 via RESPIRATORY_TRACT
  Filled 2018-03-03: qty 28

## 2018-03-03 MED ORDER — BOOST / RESOURCE BREEZE PO LIQD CUSTOM
1.0000 | Freq: Every day | ORAL | Status: DC
  Administered 2018-03-03: 1 via ORAL

## 2018-03-03 NOTE — Evaluation (Signed)
Physical Therapy Evaluation Patient Details Name: [name] MRN: [id number] DOB: [birth year] Today's Date: 03/03/2018   History of Present Illness  84 year old with past medical history relevant for COPD, CVA status post carotid endarterectomy, hypothyroidism, hypertension, hyperlipidemia, stage III CKD who came in with acute hypoxic respiratory failure due to COPD exacerbation.  Clinical Impression  PTA pt independent with community level ambulation without AD, drives, does shopping and meal prep, independent in bathing and grooming and plays bridge once a week. Pt currently limited in safe mobility by DOE, O2 desaturation with longer distance ambulation, and mild instability with higher level balance activities with gait. Pt mod I for bed mobility, and transfers and supervision for ambulation without AD. PT recommends HHPT at d/c to work on energy conservation and higher level balance activities. PT will continue to follow acutely.   Follow Up Recommendations Home health PT;Supervision - Intermittent    Equipment Recommendations  None recommended by PT       Precautions / Restrictions Precautions Precautions: Fall Restrictions Weight Bearing Restrictions: No      Mobility  Bed Mobility Overal bed mobility: Modified Independent                Transfers Overall transfer level: Modified independent                  Ambulation/Gait Ambulation/Gait assistance: Supervision Gait Distance (Feet): 400 Feet Assistive device: None Gait Pattern/deviations: Step-through pattern;Drifts right/left Gait velocity: slowed Gait velocity interpretation: 1.31 - 2.62 ft/sec, indicative of limited community ambulator General Gait Details: supervision for safety, drifts right and left with long distance gait, mild instability with higher level balance with gait, no overt LoB       Balance Overall balance assessment: Needs assistance Sitting-balance support: No upper extremity  supported;Feet unsupported Sitting balance-Leahy Scale: Normal     Standing balance support: No upper extremity supported;During functional activity Standing balance-Leahy Scale: Good               High level balance activites: Head turns;Sudden stops;Other (comment)(speed changes) High Level Balance Comments: pt slows gait and has mild instability with higher level balance exercises during gait             Pertinent Vitals/Pain Pain Assessment: No/denies pain    Home Living Family/patient expects to be discharged to:: Private residence Living Arrangements: Alone Available Help at Discharge: Available 24 hours/day;Family Type of Home: House Home Access: Stairs to enter Entrance Stairs-Rails: Psychiatric nurse of Steps: 2 Home Layout: Multi-level;Bed/bath upstairs Home Equipment: Grab bars - toilet;Hand held shower head;Grab bars - tub/shower;Shower seat;Walker - 2 wheels      Prior Function Level of Independence: Independent         Comments: drives, ambulates community distances without AD, cooks, as Secretary/administrator for cleaning and yard work         Extremity/Trunk Assessment   Upper Extremity Assessment Upper Extremity Assessment: Overall WFL for tasks assessed    Lower Extremity Assessment Lower Extremity Assessment: Overall WFL for tasks assessed       Communication   Communication: No difficulties  Cognition Arousal/Alertness: Awake/alert Behavior During Therapy: WFL for tasks assessed/performed Overall Cognitive Status: Within Functional Limits for tasks assessed                                        General Comments General comments (skin integrity, edema, etc.): Pt  on 2L O2 via [place] at entry with SaO2 96%O2, removed supplemental O2, at rest on RA SaO2 95%O2, HR 78bpm, with activity pt with 3/4 DoE, SaO2 on RA dropped momentarily to 89%O2 at very end of ambulation with HR 111 bpm, with sitting in recliner SaO2  quickly rebounded to 95%O2, SoB took 3 minutes to recover from             Assessment/Plan    PT Assessment Patient needs continued PT services  PT Problem List Decreased balance;Decreased mobility;Cardiopulmonary status limiting activity       PT Treatment Interventions DME instruction;Gait training;Stair training;Functional mobility training;Therapeutic activities;Therapeutic exercise;Balance training;Cognitive remediation;Patient/family education    PT Goals (Current goals can be found in the Care Plan section)  Acute Rehab PT Goals Patient Stated Goal: go to beach in Sept. to play bridge PT Goal Formulation: With patient Time For Goal Achievement: 03/17/18 Potential to Achieve Goals: Good    Frequency Min 3X/week    AM-PAC PT "6 Clicks" Daily Activity  Outcome Measure Difficulty turning over in bed (including adjusting bedclothes, sheets and blankets)?: None Difficulty moving from lying on back to sitting on the side of the bed? : None Difficulty sitting down on and standing up from a chair with arms (e.g., wheelchair, bedside commode, etc,.)?: None Help needed moving to and from a bed to chair (including a wheelchair)?: None Help needed walking in hospital room?: None Help needed climbing 3-5 steps with a railing? : A Little 6 Click Score: 23    End of Session Equipment Utilized During Treatment: Gait belt Activity Tolerance: Patient tolerated treatment well Patient left: in chair;with call bell/phone within reach Nurse Communication: Mobility status;Other (comment)(ready for nebulizer treatment) PT Visit Diagnosis: Other abnormalities of gait and mobility (R26.89);Difficulty in walking, not elsewhere classified (R26.2)    Time: 6770-3403 PT Time Calculation (min) (ACUTE ONLY): 28 min   Charges:   PT Evaluation $PT Eval Moderate Complexity: 1 Mod PT Treatments $Gait Training: 8-22 mins        Celeste Candelas B. Migdalia Dk PT, DPT Acute Rehabilitation  [phone] Pager [phone]    [place] 03/03/2018, 9:52 AM

## 2018-03-03 NOTE — Telephone Encounter (Signed)
We will make clinic follow up after hospital discharge to review meds and make sure she is taking them as instructed. Thanks

## 2018-03-03 NOTE — Progress Notes (Signed)
Initial Nutrition Assessment  DOCUMENTATION CODES:   Not applicable  INTERVENTION:    Boost Breeze po daily, each supplement provides 250 kcal and 9 grams of protein  NUTRITION DIAGNOSIS:   Increased nutrient needs related to chronic illness as evidenced by estimated needs  GOAL:   Patient will meet greater than or equal to 90% of their needs  MONITOR:   PO intake, Supplement acceptance, Labs, Skin, Weight trends, I & O's  REASON FOR ASSESSMENT:   Consult COPD Protocol  ASSESSMENT:   84 yo Female with PMH of COPD, CVA status post carotid endarterectomy, hypothyroidism, hypertension, hyperlipidemia, stage III CKD who came in with acute hypoxic respiratory failure due to COPD exacerbation.  RD spoke with patient today. She is resting in her recliner. Reports her appetite is getting better. Had grits, eggs, milk & OJ this AM. Pt states she typically consumes a large breakfast and dinner. Snack for lunch.  She occasionally drinks nutrition supplements such as Ensure or Boost Breeze. Denies unintentional weight loss. Weight stable per readings below. Labs and medications reviewed. Cr 1.50 (H).  NUTRITION - FOCUSED PHYSICAL EXAM:  Completed. No muscle or fat depletions noticed.  Diet Order:   Diet Order            Diet regular Room service appropriate? Yes; Fluid consistency: Thin  Diet effective now             EDUCATION NEEDS:   No education needs have been identified at this time  Skin:  Skin Assessment: Reviewed RN Assessment  Last BM:  8/26    Intake/Output Summary (Last 24 hours) at 03/03/2018 1202 Last data filed at 03/03/2018 1149 Gross per 24 hour  Intake 1194 ml  Output 100 ml  Net 1094 ml   Height:   Ht Readings from Last 1 Encounters:  03/02/18 5\' 6"  (1.676 m)   Weight:   Wt Readings from Last 1 Encounters:  03/02/18 61.3 kg   Wt Readings from Last 10 Encounters:  03/02/18 61.3 kg  02/23/18 61.3 kg  02/06/18 61.2 kg  01/27/18 61.4  kg  01/14/18 61.3 kg  11/26/17 62 kg  11/24/17 61.7 kg  11/12/17 61.2 kg  10/22/17 61.2 kg  09/11/17 61.2 kg   BMI:  Body mass index is 21.81 kg/m.  Estimated Nutritional Needs:   Kcal:  1350-1550  Protein:  65-80 gm  Fluid:  >/= 1.5 L  Arthur Holms, RD, LDN Pager #: [phone] After-Hours Pager #: [phone]

## 2018-03-03 NOTE — Progress Notes (Signed)
PROGRESS NOTE    [name]  DJM:[id number] DOB: 1925-04-05 DOA: 03/02/2018 PCP: [name], NP    Brief Narrative:  84 year old with past medical history relevant for COPD, CVA status post carotid endarterectomy, hypothyroidism, hypertension, hyperlipidemia, stage III CKD who came in with acute hypoxic respiratory failure due to COPD exacerbation.   Assessment & Plan:   Principal Problem:   COPD with acute exacerbation ([place]) Active Problems:   Hypothyroidism   Hyperlipidemia   Essential hypertension   Impaired glucose tolerance   #) Acute hypoxic respiratory failure due to COPD exacerbation: Patient is improving today.  She has some wheezing and she continues on oxygen. - Restart Breo, patient is on Trelegy at home.  She apparently has been using her maintenance inhaler instead of rescue, education provided and discussed with the daughter. -Continue short acting bronchodilators every 6 hours - Continue IV steroids, then transition to p.o.  #) Hypertension/hyperlipidemia: -Continue atorvastatin 80 mg daily -Continue amlodipine 10 mg daily - Due hydralazine 75 mg 3 times daily  #) Hypothyroid is him: -Continue levothyroxine 75 mcg daily  #) CVA status post carotid endarterectomy: -Continue statin -Continue aspirin 81 mg  #) Stage III CKD: Stable  Fluids: Tolerating p.o. Elect lites: Monitor and supplement Nutrition: Heart healthy diet  Prophylaxis: Enoxaparin  Disposition: Pending removal of oxygen and PT evaluation  Full code, patient will discuss with daughters about CODE STATUS that she has been DNR prior     Consultants:   None  Procedures:   None  Antimicrobials:   None   Subjective: Patient reports she is doing much better.  On further discussion she apparently has stopped using her rescue inhalers particularly in the morning when her symptoms of the worst she has been recently switched to Trelegy.  Education was provided to the  patient.  Additionally her daughter was talked on the phone.  She denies any chest pain, nausea, vomiting, diarrhea.  Objective: Vitals:   03/02/18 1952 03/02/18 2158 03/02/18 2300 03/03/18 0731  BP:  (!) 150/61 (!) 149/58 (!) 156/69  Pulse:   92 75  Resp:   20 18  Temp:   (!) 97.4 F (36.3 C) 98.3 F (36.8 C)  TempSrc:   Oral Oral  SpO2: 96%  98% 98%  Weight:      Height:        Intake/Output Summary (Last 24 hours) at 03/03/2018 0857 Last data filed at 03/02/2018 2013 Gross per 24 hour  Intake 240 ml  Output 100 ml  Net 140 ml   Filed Weights   03/02/18 0930  Weight: 61.3 kg    Examination:  General exam: Appears calm and comfortable  Respiratory system: Mildly increased work of breathing, wheezes worse on right, no crackles or rhonchi Cardiovascular system: Distant heart sounds, regular rate and rhythm, no murmurs Gastrointestinal system: Abdomen is nondistended, soft and nontender. No organomegaly or masses felt. Normal bowel sounds heard. Central nervous system: Alert and oriented. No focal neurological deficits. Extremities: No lower extremity edema Skin: No rashes over visible skin Psychiatry: Judgement and insight appear normal. Mood & affect appropriate.     Data Reviewed: I have personally reviewed following labs and imaging studies  CBC: Recent Labs  Lab 03/02/18 0924 03/03/18 0229  WBC 5.6 2.2*  NEUTROABS 3.2  --   HGB 13.3 11.6*  HCT 41.5 36.2  MCV 95.6 95.3  PLT 206 222   Basic Metabolic Panel: Recent Labs  Lab 03/02/18 0924 03/03/18 0229  NA 138  138  K 3.9 4.3  CL 104 105  CO2 25 24  GLUCOSE 138* 165*  BUN 14 23  CREATININE 1.20* 1.50*  CALCIUM 9.0 9.1   GFR: Estimated Creatinine Clearance: 23.3 mL/min (A) (by C-G formula based on SCr of 1.5 mg/dL (H)). Liver Function Tests: Recent Labs  Lab 03/02/18 0924  AST 21  ALT 16  ALKPHOS 140*  BILITOT 0.9  PROT 6.4*  ALBUMIN 3.5   No results for input(s): LIPASE, AMYLASE in the  last 168 hours. No results for input(s): AMMONIA in the last 168 hours. Coagulation Profile: No results for input(s): INR, PROTIME in the last 168 hours. Cardiac Enzymes: No results for input(s): CKTOTAL, CKMB, CKMBINDEX, TROPONINI in the last 168 hours. BNP (last 3 results) No results for input(s): PROBNP in the last 8760 hours. HbA1C: No results for input(s): HGBA1C in the last 72 hours. CBG: No results for input(s): GLUCAP in the last 168 hours. Lipid Profile: No results for input(s): CHOL, HDL, LDLCALC, TRIG, CHOLHDL, LDLDIRECT in the last 72 hours. Thyroid Function Tests: Recent Labs    03/02/18 1845  TSH 0.616   Anemia Panel: No results for input(s): VITAMINB12, FOLATE, FERRITIN, TIBC, IRON, RETICCTPCT in the last 72 hours. Sepsis Labs: No results for input(s): PROCALCITON, LATICACIDVEN in the last 168 hours.  No results found for this or any previous visit (from the past 240 hour(s)).       Radiology Studies: Dg Chest 2 View  Result Date: 03/02/2018 CLINICAL DATA:  Cough and shortness of Breath EXAM: CHEST - 2 VIEW COMPARISON:  01/12/2018 FINDINGS: Cardiac shadow is within normal limits. The lungs are hyperinflated consistent with COPD. Chronic blunting of left costophrenic angle noted. No focal infiltrate or sizable effusion is seen. Degenerative changes of the thoracic spine are noted. IMPRESSION: COPD without acute abnormality. Electronically Signed   By: Inez Catalina M.D.   On: 03/02/2018 10:14        Scheduled Meds: . amLODipine  10 mg Oral Daily  . aspirin EC  81 mg Oral Daily  . atorvastatin  80 mg Oral q1800  . docusate sodium  100 mg Oral BID  . enoxaparin (LOVENOX) injection  30 mg Subcutaneous Q24H  . fluticasone furoate-vilanterol  1 puff Inhalation Daily  . hydrALAZINE  75 mg Oral TID  . ipratropium-albuterol  3 mL Nebulization TID  . levothyroxine  75 mcg Oral QAC breakfast  . predniSONE  40 mg Oral Q breakfast   Continuous Infusions:   LOS:  0 days    Time spent: Anna, MD [facility]  If 7PM-7AM, please contact night-coverage [email or web address] Password [facility] 03/03/2018, 8:57 AM

## 2018-03-03 NOTE — Care Management Note (Addendum)
Case Management Note  Patient Details  Name: [name] MRN: [id number] Date of Birth: [birth year]  Subjective/Objective:   From home alone, presents with copd ex.  Per pt eval rec HHPT, she will need copd disease management also.  NCM offered choice to patient from the [facility] agency list, she chose [facility], referral made to Butch Penny for [facility] for disease management for copd and HHPT.  Soc will begin 24-48 hrs post dc.  She has a cane at home , preferred pharmacy is [facility] on [address].                  Action/Plan: DC home when ready.  Expected Discharge Date:  03/03/18               Expected Discharge Plan:  [place]  In-House Referral:     Discharge planning Services  CM Consult  Post Acute Care Choice:  Home Health Choice offered to:  Patient  DME Arranged:    DME Agency:     HH Arranged:  RN, Disease Management, PT [place] Agency:  [facility]  Status of Service:  Completed, signed off  If discussed at [place] of Stay Meetings, dates discussed:    Additional Comments:  Zenon Mayo, RN 03/03/2018, 10:39 AM

## 2018-03-04 DIAGNOSIS — J441 Chronic obstructive pulmonary disease with (acute) exacerbation: Secondary | ICD-10-CM | POA: Diagnosis not present

## 2018-03-04 MED ORDER — PREDNISONE 20 MG PO TABS: 40.0000 mg | ORAL_TABLET | Freq: Every day | ORAL | 0 refills | Status: AC

## 2018-03-04 NOTE — Discharge Summary (Signed)
Physician Discharge Summary  [name] MGN:[id number] DOB: 03-18-1925 DOA: 03/02/2018  PCP: Gloria Peng, NP  Admit date: 03/02/2018 Discharge date: 03/04/2018  Admitted From: Home Disposition:  Home  Discharge Condition:Stable CODE STATUS:FULL Diet recommendation: Heart Healthy   Brief/Interim Summary: Patient is a 84 year old with past medical history relevant for COPD, CVA status post carotid endarterectomy, hypothyroidism, hypertension, hyperlipidemia, stage III CKD who came in with complaints of shortness of breath.  Patient was found to have acute hypoxic respiratory failure due to COPD exacerbation.  He was being managed for COPD exacerbation.  She was started on IV steroids.  Patient's respiratory status significantly improved and she is stable for discharge home today with oral steroids.  She will follow-up with her pulmonologist as an outpatient.  Currently she is saturating fine on room air and does not need supplemental oxygen on discharge.  Following problems were addressed during her hospitalization:   #) Acute hypoxic respiratory failure due to COPD exacerbation: Patient's respiratory status is improving.  Currently she does not need supplemental oxygen.  She does not have wheezes on auscultation this morning.  She will continue onTrelegy at home.  Will be prescribed short course of prednisone for home.  She will follow-up with her pulmonologist as soon as possible and her appointment has already been set up.   #) Hypertension/hyperlipidemia: -Continue atorvastatin 80 mg daily -Continue amlodipine 10 mg daily - Continue hydralazine 75 mg 3 times daily  #) Hypothyroidism: -Continue levothyroxine 75 mcg daily  #) CVA status post carotid endarterectomy: -Continue statin -Continue aspirin 81 mg  #) Stage III CKD: Stable.Follow up with BMP in a week during the follow-up with her PCP.   Discharge Diagnoses:  Principal Problem:   COPD with acute exacerbation  ([place]) Active Problems:   Hypothyroidism   Hyperlipidemia   Essential hypertension   Impaired glucose tolerance    Discharge Instructions  Discharge Instructions    Diet - low sodium heart healthy   Complete by:  As directed    Discharge instructions   Complete by:  As directed    1) Follow up with your pulmonologist  in the next scheduled  Appointment. 2)Continue your home inhalers.  Take prescribed medications as instructed. 3) Follow-up with your PCP in a week.  Do a CBC, BMP test during the follow-up.   Increase activity slowly   Complete by:  As directed      Allergies as of 03/04/2018      Reactions   Catapres [clonidine Hcl] Other (See Comments)   Made pt feel horrible, shaky, weak and nausea   Lisinopril Anaphylaxis   Tongue swelling   Labetalol Hcl Other (See Comments)   Caused bradycardia and syncope      Medication List    TAKE these medications   AEROCHAMBER MV inhaler Use as instructed   albuterol (2.5 MG/3ML) 0.083% nebulizer solution Commonly known as:  PROVENTIL Take 3 mLs (2.5 mg total) by nebulization every 6 (six) hours as needed for wheezing or shortness of breath.   albuterol 108 (90 Base) MCG/ACT inhaler Commonly known as:  PROVENTIL HFA;VENTOLIN HFA Inhale 2 puffs into the lungs every 6 (six) hours as needed for wheezing or shortness of breath.   amLODipine 10 MG tablet Commonly known as:  NORVASC Take 1 tablet (10 mg total) by mouth daily.   aspirin EC 81 MG tablet Take 1 tablet (81 mg total) by mouth daily.   atorvastatin 80 MG tablet Commonly known as:  LIPITOR TAKE 1  TABLET BY MOUTH EVERY DAY AT 6PM What changed:  See the new instructions.   [employer] DRY EYE RELIEF 0.2-0.2-1 % Soln Generic drug:  Glycerin-Hypromellose-PEG 400 Place 1 drop into both eyes as needed.   hydrALAZINE 25 MG tablet Commonly known as:  APRESOLINE TAKE 3 TABLETS BY MOUTH 3 TIMES A DAY What changed:  See the new instructions.   levothyroxine 75 MCG  tablet Commonly known as:  SYNTHROID, LEVOTHROID TAKE 1 TABLET BY MOUTH EVERY DAY What changed:  when to take this   predniSONE 20 MG tablet Commonly known as:  DELTASONE Take 2 tablets (40 mg total) by mouth daily with breakfast for 4 days.      Follow-up Information    Gloria Peng, NP. Schedule an appointment as soon as possible for a visit in 1 week(s).   Specialty:  Family Medicine Contact information: [place] [address] [phone]        Marshell Garfinkel, MD. Schedule an appointment as soon as possible for a visit in 1 week(s).   Specialty:  Pulmonary Disease Contact information: [address] [place] [address] [phone]          Allergies  Allergen Reactions  . Catapres [Clonidine Hcl] Other (See Comments)    Made pt feel horrible, shaky, weak and nausea  . Lisinopril Anaphylaxis    Tongue swelling  . Labetalol Hcl Other (See Comments)    Caused bradycardia and syncope    Consultations:  None   Procedures/Studies: Dg Chest 2 View  Result Date: 03/02/2018 CLINICAL DATA:  Cough and shortness of Breath EXAM: CHEST - 2 VIEW COMPARISON:  01/12/2018 FINDINGS: Cardiac shadow is within normal limits. The lungs are hyperinflated consistent with COPD. Chronic blunting of left costophrenic angle noted. No focal infiltrate or sizable effusion is seen. Degenerative changes of the thoracic spine are noted. IMPRESSION: COPD without acute abnormality. Electronically Signed   By: Inez Catalina M.D.   On: 03/02/2018 10:14       Subjective: Patient seen and examined the bedside this morning.  Remains comfortable.  Off oxygen.  Respiratory status stable.  Medically stable for discharge to home.  Discharge Exam: Vitals:   03/03/18 2034 03/03/18 2307  BP:  (!) 144/55  Pulse:  72  Resp:  16  Temp:  97.8 F (36.6 C)  SpO2: 94% 94%   Vitals:   03/03/18 1343 03/03/18 1600 03/03/18 2034 03/03/18 2307  BP:  (!) 145/59   (!) 144/55  Pulse:  87  72  Resp:    16  Temp:  97.9 F (36.6 C)  97.8 F (36.6 C)  TempSrc:  Oral  Oral  SpO2: 96% 99% 94% 94%  Weight:      Height:        General: Pt is alert, awake, not in acute distress Cardiovascular: RRR, S1/S2 +, no rubs, no gallops Respiratory: CTA bilaterally, no wheezing, no rhonchi Abdominal: Soft, NT, ND, bowel sounds + Extremities: no edema, no cyanosis    The results of significant diagnostics from this hospitalization (including imaging, microbiology, ancillary and laboratory) are listed below for reference.     Microbiology: Recent Results (from the past 240 hour(s))  Urine culture     Status: Abnormal   Collection Time: 03/02/18  9:16 AM  Result Value Ref Range Status   Specimen Description URINE, RANDOM  Final   Special Requests   Final    NONE Performed at [facility], [address] [place], [place] [address]    Culture MULTIPLE SPECIES PRESENT, SUGGEST RECOLLECTION (A)  Final   Report Status 03/03/2018 FINAL  Final     Labs: BNP (last 3 results) Recent Labs    11/12/17 1825 01/13/18 0021 03/02/18 0924  BNP 111.9* 105.0* 46.9   Basic Metabolic Panel: Recent Labs  Lab 03/02/18 0924 03/03/18 0229  NA 138 138  K 3.9 4.3  CL 104 105  CO2 25 24  GLUCOSE 138* 165*  BUN 14 23  CREATININE 1.20* 1.50*  CALCIUM 9.0 9.1   Liver Function Tests: Recent Labs  Lab 03/02/18 0924  AST 21  ALT 16  ALKPHOS 140*  BILITOT 0.9  PROT 6.4*  ALBUMIN 3.5   No results for input(s): LIPASE, AMYLASE in the last 168 hours. No results for input(s): AMMONIA in the last 168 hours. CBC: Recent Labs  Lab 03/02/18 0924 03/03/18 0229  WBC 5.6 2.2*  NEUTROABS 3.2  --   HGB 13.3 11.6*  HCT 41.5 36.2  MCV 95.6 95.3  PLT 206 186   Cardiac Enzymes: No results for input(s): CKTOTAL, CKMB, CKMBINDEX, TROPONINI in the last 168 hours. BNP: Invalid input(s): POCBNP CBG: No results for input(s): GLUCAP in the last 168  hours. D-Dimer No results for input(s): DDIMER in the last 72 hours. Hgb A1c No results for input(s): HGBA1C in the last 72 hours. Lipid Profile No results for input(s): CHOL, HDL, LDLCALC, TRIG, CHOLHDL, LDLDIRECT in the last 72 hours. Thyroid function studies Recent Labs    03/02/18 1845  TSH 0.616   Anemia work up No results for input(s): VITAMINB12, FOLATE, FERRITIN, TIBC, IRON, RETICCTPCT in the last 72 hours. Urinalysis    Component Value Date/Time   COLORURINE YELLOW 03/02/2018 1346   APPEARANCEUR CLEAR 03/02/2018 1346   LABSPEC 1.010 03/02/2018 1346   PHURINE 6.0 03/02/2018 1346   GLUCOSEU NEGATIVE 03/02/2018 1346   GLUCOSEU NEGATIVE 12/11/2015 1216   HGBUR NEGATIVE 03/02/2018 1346   BILIRUBINUR NEGATIVE 03/02/2018 1346   BILIRUBINUR n 08/17/2012 1455   KETONESUR NEGATIVE 03/02/2018 1346   PROTEINUR NEGATIVE 03/02/2018 1346   UROBILINOGEN 0.2 12/11/2015 1216   NITRITE NEGATIVE 03/02/2018 1346   LEUKOCYTESUR NEGATIVE 03/02/2018 1346   Sepsis Labs Invalid input(s): PROCALCITONIN,  WBC,  LACTICIDVEN Microbiology Recent Results (from the past 240 hour(s))  Urine culture     Status: Abnormal   Collection Time: 03/02/18  9:16 AM  Result Value Ref Range Status   Specimen Description URINE, RANDOM  Final   Special Requests   Final    NONE Performed at [facility], [place] [address] [place], [place] [address]    Culture MULTIPLE SPECIES PRESENT, SUGGEST RECOLLECTION (A)  Final   Report Status 03/03/2018 FINAL  Final    Please note: You were cared for by a hospitalist during your hospital stay. Once you are discharged, your primary care physician will handle any further medical issues. Please note that NO REFILLS for any discharge medications will be authorized once you are discharged, as it is imperative that you return to your primary care physician (or establish a relationship with a primary care physician if you do not have one) for your post hospital  discharge needs so that they can reassess your need for medications and monitor your lab values.    Time coordinating discharge: 40 minutes  SIGNED:   Shelly Coss, MD  [facility] 03/04/2018, 8:05 AM Pager [phone]  If 7PM-7AM, please contact night-coverage [email or web address] Password [email or web address]

## 2018-03-04 NOTE — Progress Notes (Signed)
Verified [place] set up a [facility], notified of DC today. No further CM needs.

## 2018-03-05 ENCOUNTER — Telehealth: Payer: Self-pay | Admitting: Family Medicine

## 2018-03-05 NOTE — Telephone Encounter (Signed)
Transition Care Management Follow-up Telephone Call Physician Discharge Summary  CORNELLA EMMER BJS:[id number] DOB: [birth year] DOA: 03/02/2018  PCP: [name], NP  Admit date: 03/02/2018 Discharge date: 03/04/2018  Admitted From: Home Disposition:  Home  Discharge Condition:Stable CODE STATUS:FULL Diet recommendation: Heart Healthy   Brief/Interim Summary: Patient is a 84 year old with past medical history relevant for COPD, CVA status post carotid endarterectomy, hypothyroidism, hypertension, hyperlipidemia, stage III CKD who came in with complaints of shortness of breath.  Patient was found to have acute hypoxic respiratory failure due to COPD exacerbation.  He was being managed for COPD exacerbation.  She was started on IV steroids.  Patient's respiratory status significantly improved and she is stable for discharge home today with oral steroids.  She will follow-up with her pulmonologist as an outpatient.  Currently she is saturating fine on room air and does not need supplemental oxygen on discharge.    How have you been since you were released from the hospital? "much better, feeling good"   Do you understand why you were in the hospital? yes   Do you understand the discharge instructions? yes    Where were you discharged to? Home   Items Reviewed:  Medications reviewed: yes  Allergies reviewed: yes  Dietary changes reviewed: yes  Referrals reviewed: yes   Functional Questionnaire:   Activities of Daily Living (ADLs):   She states they are independent in the following: ambulation, bathing and hygiene, feeding, continence, grooming, toileting and dressing States they require assistance with the following: none   Any transportation issues/concerns?: no   Any patient concerns? no   Confirmed importance and date/time of follow-up visits scheduled yes  Provider Appointment booked with, pt declined to schedule here right now, she has pulmonary  appointment on 03/13/2018.  Confirmed with patient if condition begins to worsen call PCP or go to the ER.  Patient was given the office number and encouraged to call back with question or concerns.  : yes

## 2018-03-12 ENCOUNTER — Inpatient Hospital Stay: Payer: Medicare Other | Admitting: Internal Medicine

## 2018-03-13 ENCOUNTER — Telehealth: Payer: Self-pay | Admitting: Family Medicine

## 2018-03-13 ENCOUNTER — Encounter: Payer: Self-pay | Admitting: Primary Care

## 2018-03-13 ENCOUNTER — Telehealth: Payer: Self-pay | Admitting: Pulmonary Disease

## 2018-03-13 ENCOUNTER — Ambulatory Visit: Payer: Medicare Other | Admitting: Primary Care

## 2018-03-13 DIAGNOSIS — J441 Chronic obstructive pulmonary disease with (acute) exacerbation: Secondary | ICD-10-CM | POA: Diagnosis not present

## 2018-03-13 DIAGNOSIS — Z23 Encounter for immunization: Secondary | ICD-10-CM | POA: Diagnosis not present

## 2018-03-13 MED ORDER — IPRATROPIUM-ALBUTEROL 0.5-2.5 (3) MG/3ML IN SOLN: 3.0000 mL | Freq: Four times a day (QID) | RESPIRATORY_TRACT | 3 refills | Status: DC | PRN

## 2018-03-13 MED ORDER — PREDNISONE 10 MG PO TABS: ORAL_TABLET | ORAL | 0 refills | Status: DC

## 2018-03-13 NOTE — Progress Notes (Signed)
@Patient  ID: [name], female    DOB: Jun 24, 1925, 84 y.o.   MRN: [id number]  Chief Complaint  Patient presents with  . Follow-up    using nebs twice a day    Referring provider: [name], NP  HPI: 84 year old female, former smoker. PMH COPD, emphysema, hypertension, CKD stage III. Patient of Dr. Vaughan Browner, last seen on 02/06/18. During that visit Breo Ellipta was changed to Trelegy. Recent hospital stay for COPD exacerbation.   Hospital course: Patient recently admitted to [facility] hospital from 08/26-08/28 d/t acute hypoxic respiratory failure/COPD exacerbation. She originally presented to ED with complaints of SOB. Started on IV steroids and respiratory status significantly improved. CXR showed no infiltrate or sizable effusion. Discharged with oral steroids. No supplemental oxygen needed.   03/13/2018 Patient presents today for hospital follow-up with her daughter. States that she is doing fine, finished prednisone last Sunday. Using nebulizer treatments twice a day, states that she was getting duonebs while in the hospital and felt a lot better with that particular medication.  Continues Trelegy daily. Planning on an upcoming trip to beach in [facility] this weekend with friends. Daughter is worried that her symptoms may flare while away, ok to have prednisone course on hand in case of an emergency. Will bring neb machine and rescue inhaler as well.    Allergies  Allergen Reactions  . Catapres [Clonidine Hcl] Other (See Comments)    Made pt feel horrible, shaky, weak and nausea  . Lisinopril Anaphylaxis    Tongue swelling  . Labetalol Hcl Other (See Comments)    Caused bradycardia and syncope    Immunization History  Administered Date(s) Administered  . Influenza Split 05/01/2011, 04/29/2012  . Influenza Whole 04/14/2007, 04/12/2008, 04/12/2009, 04/25/2010  . Influenza, High Dose Seasonal PF 04/12/2014, 04/25/2015, 03/13/2018  . Influenza,inj,Quad PF,6+ Mos 03/09/2013  .  Influenza-Unspecified 03/22/2016, 04/15/2017  . Pneumococcal Conjugate-13 06/09/2013  . Pneumococcal Polysaccharide-23 04/07/1993, 05/08/2006  . Td 12/12/2008    Past Medical History:  Diagnosis Date  . Anxiety   . Aortic arch atherosclerosis ([place]) 06/24/2014  . Atherosclerotic ulcer of aorta ([place]) 06/24/2014  . Carotid artery occlusion   . [place] DISEASE, LUMBAR 12/16/2008  . DIVERTICULOSIS, COLON 09/30/2008  . DYSPNEA 07/13/2008  . Graves disease   . History of embolic stroke 08/17/4707   Left brain  . HYPERLIPIDEMIA 03/06/2007  . HYPERTENSION 03/06/2007  . HYPOTHYROIDISM 10/13/2007  . OSTEOARTHRITIS 03/06/2007  . Personal history of colonic polyps 09/30/2008  . Stroke ([place])    06/2014           . TOBACCO USE, QUIT 04/12/2009  . WEAKNESS 11/09/2007    Tobacco History: Social History   Tobacco Use  Smoking Status Former Smoker  . Packs/day: 1.50  . Years: 29.00  . Pack years: 43.50  . Types: Cigarettes  . Start date: 07/08/1949  . Last attempt to quit: 07/08/1978  . Years since quitting: 39.7  Smokeless Tobacco Never Used   Counseling given: Not Answered   Outpatient Medications Prior to Visit  Medication Sig Dispense Refill  . albuterol (PROAIR HFA) 108 (90 Base) MCG/ACT inhaler Inhale 2 puffs into the lungs every 6 (six) hours as needed for wheezing or shortness of breath. 8.5 Inhaler 5  . amLODipine (NORVASC) 10 MG tablet Take 1 tablet (10 mg total) by mouth daily. 90 tablet 3  . aspirin EC 81 MG tablet Take 1 tablet (81 mg total) by mouth daily. 30 tablet 0  . atorvastatin (  LIPITOR) 80 MG tablet TAKE 1 TABLET BY MOUTH EVERY DAY AT 6PM (Patient taking differently: Take 80 mg by mouth daily at 6 PM. ) 90 tablet 2  . Fluticasone-Umeclidin-Vilant (TRELEGY ELLIPTA) 100-62.5-25 MCG/INH AEPB Inhale 1 puff into the lungs daily.    . Glycerin-Hypromellose-PEG 400 ([employer] DRY EYE RELIEF) 0.2-0.2-1 % SOLN Place 1 drop into both eyes as needed.    . hydrALAZINE (APRESOLINE) 25 MG tablet TAKE  3 TABLETS BY MOUTH 3 TIMES A DAY (Patient taking differently: Take 75 mg by mouth 3 (three) times daily. ) 270 tablet 9  . levothyroxine (SYNTHROID, LEVOTHROID) 75 MCG tablet TAKE 1 TABLET BY MOUTH EVERY DAY (Patient taking differently: Take 75 mcg by mouth daily before breakfast. ) 90 tablet 1  . Spacer/Aero-Holding Chambers (AEROCHAMBER MV) inhaler Use as instructed 1 each 0  . albuterol (PROVENTIL) (2.5 MG/3ML) 0.083% nebulizer solution Take 3 mLs (2.5 mg total) by nebulization every 6 (six) hours as needed for wheezing or shortness of breath. 75 mL 12   No facility-administered medications prior to visit.     Review of Systems  Review of Systems  Constitutional: Negative.   HENT: Negative.   Respiratory: Negative for cough, shortness of breath and wheezing.   Cardiovascular: Negative.   Skin: Negative.     Physical Exam  BP (!) 150/70 (BP Location: Left Arm, Cuff Size: Normal)   Pulse 89   Ht 5\' 6"  (1.676 m)   Wt 133 lb 9.6 oz (60.6 kg)   SpO2 94%   BMI 21.56 kg/m  Physical Exam  Constitutional: She is oriented to person, place, and time. She appears well-developed and well-nourished.  Thin elderly female, no acute distress.   HENT:  Head: Normocephalic and atraumatic.  Eyes: Pupils are equal, round, and reactive to light. EOM are normal.  Neck: Normal range of motion. Neck supple.  Cardiovascular: Normal rate and regular rhythm.  Pulmonary/Chest: Effort normal and breath sounds normal. No stridor. No respiratory distress. She has no wheezes. She has no rales.  CTA bilaterally. No resp distress or wheezing.  Musculoskeletal: Normal range of motion.  Neurological: She is alert and oriented to person, place, and time.  Skin: Skin is dry.  Psychiatric: She has a normal mood and affect. Her behavior is normal. Judgment and thought content normal.     Lab Results:  CBC    Component Value Date/Time   WBC 2.2 (L) 03/03/2018 0229   RBC 3.80 (L) 03/03/2018 0229   HGB 11.6  (L) 03/03/2018 0229   HGB 13.3 08/20/2017 1430   HCT 36.2 03/03/2018 0229   HCT 38.9 08/20/2017 1430   PLT 186 03/03/2018 0229   PLT 213 08/20/2017 1430   MCV 95.3 03/03/2018 0229   MCV 92 08/20/2017 1430   MCH 30.5 03/03/2018 0229   MCHC 32.0 03/03/2018 0229   RDW 12.7 03/03/2018 0229   RDW 14.1 08/20/2017 1430   LYMPHSABS 1.4 03/02/2018 0924   MONOABS 0.7 03/02/2018 0924   EOSABS 0.3 03/02/2018 0924   BASOSABS 0.1 03/02/2018 0924    BMET    Component Value Date/Time   NA 138 03/03/2018 0229   NA 140 08/20/2017 1430   K 4.3 03/03/2018 0229   CL 105 03/03/2018 0229   CO2 24 03/03/2018 0229   GLUCOSE 165 (H) 03/03/2018 0229   BUN 23 03/03/2018 0229   BUN 18 08/20/2017 1430   CREATININE 1.50 (H) 03/03/2018 0229   CALCIUM 9.1 03/03/2018 0229   GFRNONAA 29 (L)  03/03/2018 0229   GFRAA 34 (L) 03/03/2018 0229    BNP    Component Value Date/Time   BNP 81.9 03/02/2018 0924    ProBNP    Component Value Date/Time   PROBNP 46.0 12/11/2015 1216    Imaging: Dg Chest 2 View  Result Date: 03/02/2018 CLINICAL DATA:  Cough and shortness of Breath EXAM: CHEST - 2 VIEW COMPARISON:  01/12/2018 FINDINGS: Cardiac shadow is within normal limits. The lungs are hyperinflated consistent with COPD. Chronic blunting of left costophrenic angle noted. No focal infiltrate or sizable effusion is seen. Degenerative changes of the thoracic spine are noted. IMPRESSION: COPD without acute abnormality. Electronically Signed   By: Inez Catalina M.D.   On: 03/02/2018 10:14     Assessment & Plan:  84 year old female, hx COPD. PFTs in July showed moderate obstruction with DLCO impairement, no real change from previous in 2017. Recent hospitalization end of August for COPD exacerbation. Treated with IV steroids and discharged with oral prednisone. Patient is feeling well today, no acute complaints. No significant shortness of breath or wheezing today. Using nebulizer at home twice a day, feels her  breathing was a lot better when using duoneb treatments in the hospital. Continues to use Trelegy, has not noticed a difference compared to when she was on Breo.   COPD with acute exacerbation ([place]) - Improved; Completed oral prednisone course - Doing well today. VSS, O2 sat 94% RA - Change Albuterol nebulizer to Duonebs q6hrs prn sob/wheeze - Ok to have prednisone course on hand in case of an emergency while away on vacation this weekend    COPD (chronic obstructive pulmonary disease) ([place]) - PFTs in July showed moderate obstruction with DLCO impairement - Continue Trelegly 1 puff qd - Received high dose influenza vaccine today  - FU with Dr. Vaughan Browner in 2-3 months       Martyn Ehrich, NP 03/13/2018

## 2018-03-13 NOTE — Assessment & Plan Note (Addendum)
-   PFTs in July showed moderate obstruction with DLCO impairement - Continue Trelegly 1 puff qd - Received high dose influenza vaccine today  - FU with Dr. Vaughan Browner in 2-3 months

## 2018-03-13 NOTE — Assessment & Plan Note (Addendum)
-   Improved; Completed oral prednisone course - Doing well today. VSS, O2 sat 94% RA - Change Albuterol nebulizer to Duonebs q6hrs prn sob/wheeze - Ok to have prednisone course on hand in case of an emergency while away on vacation this weekend

## 2018-03-13 NOTE — Telephone Encounter (Signed)
Spoke with Jude at [facility]. Verbal order given for PT and Nursing. Nothing further needed at this time.

## 2018-03-13 NOTE — Patient Instructions (Addendum)
Continue Trelegy as prescribed   New RX: Duoneb nebulizer twice a day x1 week; than once daily and as needed for sob/wheeze Prednisone prescription for emergency only while away   Office treatment: Received influenza vaccine today   FU in 2-3 months with Dr. Vaughan Browner

## 2018-03-13 NOTE — Telephone Encounter (Signed)
Yes. Ok to give order for this

## 2018-03-13 NOTE — Telephone Encounter (Signed)
Spoke to [place].  Advised that the pt has never seen [facility] and does not have an appt to establish with him.  Unable to provide orders until the pt is seen.  Clair Gulling to call the pt.  No further action required.

## 2018-03-13 NOTE — Telephone Encounter (Signed)
Attempted to call Clair Gulling with [facility] today regarding. I did not receive an answer at time of call. I have left a voicemail message for pt to return call. X1

## 2018-03-13 NOTE — Telephone Encounter (Signed)
Copied from [place] [phone]. Topic: Inquiry >> Mar 13, 2018 10:27 AM Oliver Pila B wrote: Reason for CRM: [facility] called for verbal orders for PT for functional mobility and referral for nursing due to multiple hospitalizations for breathing Contact [phone]

## 2018-03-13 NOTE — Telephone Encounter (Signed)
Dr. Vaughan Browner - please advise if you are okay giving a verbal order for this. Thanks.

## 2018-03-13 NOTE — Telephone Encounter (Signed)
Returned call.  OK to LM on VM as it is confidential.///hdp

## 2018-03-24 ENCOUNTER — Other Ambulatory Visit: Payer: Self-pay | Admitting: Internal Medicine

## 2018-03-25 ENCOUNTER — Ambulatory Visit: Payer: Medicare Other | Admitting: Family Medicine

## 2018-03-25 ENCOUNTER — Encounter: Payer: Self-pay | Admitting: *Deleted

## 2018-03-25 ENCOUNTER — Ambulatory Visit (INDEPENDENT_AMBULATORY_CARE_PROVIDER_SITE_OTHER): Payer: Medicare Other

## 2018-03-25 ENCOUNTER — Encounter: Payer: Self-pay | Admitting: Family Medicine

## 2018-03-25 VITALS — BP 140/70 | HR 80 | Temp 97.6°F | Resp 16 | Ht 66.0 in | Wt 132.2 lb

## 2018-03-25 DIAGNOSIS — R0600 Dyspnea, unspecified: Secondary | ICD-10-CM

## 2018-03-25 DIAGNOSIS — R5383 Other fatigue: Secondary | ICD-10-CM | POA: Diagnosis not present

## 2018-03-25 LAB — CBC WITH DIFFERENTIAL/PLATELET
BASOS PCT: 1.4 % (ref 0.0–3.0)
Basophils Absolute: 0.1 10*3/uL (ref 0.0–0.1)
Eosinophils Absolute: 0.2 10*3/uL (ref 0.0–0.7)
Eosinophils Relative: 5.4 % — ABNORMAL HIGH (ref 0.0–5.0)
HCT: 39 % (ref 36.0–46.0)
Hemoglobin: 13.1 g/dL (ref 12.0–15.0)
Lymphocytes Relative: 16.3 % (ref 12.0–46.0)
Lymphs Abs: 0.6 10*3/uL — ABNORMAL LOW (ref 0.7–4.0)
MCHC: 33.6 g/dL (ref 30.0–36.0)
MCV: 92 fl (ref 78.0–100.0)
MONO ABS: 0.4 10*3/uL (ref 0.1–1.0)
Monocytes Relative: 12.6 % — ABNORMAL HIGH (ref 3.0–12.0)
NEUTROS ABS: 2.3 10*3/uL (ref 1.4–7.7)
Neutrophils Relative %: 64.3 % (ref 43.0–77.0)
PLATELETS: 223 10*3/uL (ref 150.0–400.0)
RBC: 4.24 Mil/uL (ref 3.87–5.11)
RDW: 13.5 % (ref 11.5–15.5)
WBC: 3.5 10*3/uL — ABNORMAL LOW (ref 4.0–10.5)

## 2018-03-25 LAB — BASIC METABOLIC PANEL
BUN: 12 mg/dL (ref 6–23)
CALCIUM: 8.8 mg/dL (ref 8.4–10.5)
CO2: 28 mEq/L (ref 19–32)
Chloride: 102 mEq/L (ref 96–112)
Creatinine, Ser: 1.04 mg/dL (ref 0.40–1.20)
GFR: 52.82 mL/min — AB (ref >60.00)
Glucose, Bld: 118 mg/dL — ABNORMAL HIGH (ref 70–99)
POTASSIUM: 3.8 meq/L (ref 3.5–5.1)
SODIUM: 138 meq/L (ref 135–145)

## 2018-03-25 NOTE — Patient Instructions (Signed)
A few things to remember from today's visit:   Dyspnea, unspecified type - Plan: CBC with Differential/Platelet, Basic metabolic panel, DG Chest 2 View  Fatigue, unspecified type - Plan: CBC with Differential/Platelet, Basic metabolic panel  Recommendations will be given according to lab and imaging results.  Rest and adequate hydration for now.

## 2018-03-25 NOTE — Progress Notes (Signed)
ACUTE VISIT   HPI:  Chief Complaint  Patient presents with  . No energy    gets short winded sometimes  . Poor appetite    Ms.[name] is a 84 y.o. female with history of COPD, emphysema, hypertension, and CKD 3 is here today with her daughter complaining of 2 days of fatigue and shortness of breath. She is reporting all these symptoms as new. Listed on her problem list is SOB.  Symptoms seem to be stable.  According to her daughter, she has had similar symptoms once a few years ago when she had "internal bleeding."  So she would like to have "hemoglobin" done.  Negative for fever, chills, or unusual arthralgias/myalgias. She denies headache, sore throat, chest pain, wheezing, abdominal pain, nausea, vomiting, changes in bowel habits, or urinary symptoms.  She has a "little cough last night." Dyspnea is exacerbated by mild exertion, walking. No associated diaphoresis.  No sick contact or recent travel. She has not try OTC medications.  History of hypothyroidism, currently she is on levothyroxine 75 mcg daily.  Lab Results  Component Value Date   TSH 0.616 03/02/2018    Lab Results  Component Value Date   WBC 2.2 (L) 03/03/2018   HGB 11.6 (L) 03/03/2018   HCT 36.2 03/03/2018   MCV 95.3 03/03/2018   PLT 186 03/03/2018   Lab Results  Component Value Date   CREATININE 1.50 (H) 03/03/2018   BUN 23 03/03/2018   NA 138 03/03/2018   K 4.3 03/03/2018   CL 105 03/03/2018   CO2 24 03/03/2018   She follows with pulmonologist, Dr. Volanda Napoleon for COPD.  Currently she is on albuterol inhaler, Trelegy Ellipta once daily, and DuoNeb 3 times daily as needed. She was recently treated with prednisone taper.   Review of Systems  Constitutional: Positive for activity change, appetite change and fatigue. Negative for diaphoresis, fever and unexpected weight change.  HENT: Negative for mouth sores, nosebleeds and sore throat.   Respiratory: Positive for cough and  shortness of breath. Negative for wheezing.   Cardiovascular: Negative for chest pain and leg swelling.  Endocrine: Negative for cold intolerance and heat intolerance.  Genitourinary: Negative for decreased urine volume, dysuria and hematuria.  Musculoskeletal: Negative for joint swelling and myalgias.  Skin: Negative for rash and wound.  Neurological: Negative for syncope, weakness and headaches.     Current Outpatient Medications on File Prior to Visit  Medication Sig Dispense Refill  . albuterol (PROAIR HFA) 108 (90 Base) MCG/ACT inhaler Inhale 2 puffs into the lungs every 6 (six) hours as needed for wheezing or shortness of breath. 8.5 Inhaler 5  . amLODipine (NORVASC) 10 MG tablet Take 1 tablet (10 mg total) by mouth daily. 90 tablet 3  . aspirin EC 81 MG tablet Take 1 tablet (81 mg total) by mouth daily. 30 tablet 0  . atorvastatin (LIPITOR) 80 MG tablet TAKE 1 TABLET BY MOUTH EVERY DAY AT 6PM (Patient taking differently: Take 80 mg by mouth daily at 6 PM. ) 90 tablet 2  . Fluticasone-Umeclidin-Vilant (TRELEGY ELLIPTA) 100-62.5-25 MCG/INH AEPB Inhale 1 puff into the lungs daily.    . Glycerin-Hypromellose-PEG 400 ([facility] DRY EYE RELIEF) 0.2-0.2-1 % SOLN Place 1 drop into both eyes as needed.    . hydrALAZINE (APRESOLINE) 25 MG tablet TAKE 3 TABLETS BY MOUTH 3 TIMES A DAY (Patient taking differently: Take 75 mg by mouth 3 (three) times daily. ) 270 tablet 9  . ipratropium-albuterol (DUONEB)  0.5-2.5 (3) MG/3ML SOLN Take 3 mLs by nebulization every 6 (six) hours as needed. 360 mL 3  . levothyroxine (SYNTHROID, LEVOTHROID) 75 MCG tablet TAKE 1 TABLET BY MOUTH EVERY DAY (Patient taking differently: Take 75 mcg by mouth daily before breakfast. ) 90 tablet 1  . predniSONE (DELTASONE) 10 MG tablet Take 4 tabs po daily x 2 days; then 3 tabs for 2 days; then 2 tabs for 2 days; then 1 tab for 2 days 20 tablet 0  . Spacer/Aero-Holding Chambers (AEROCHAMBER MV) inhaler Use as instructed 1 each 0   No  current facility-administered medications on file prior to visit.      Past Medical History:  Diagnosis Date  . Anxiety   . Aortic arch atherosclerosis ([place]) 06/24/2014  . Atherosclerotic ulcer of aorta ([place]) 06/24/2014  . Carotid artery occlusion   . [place] DISEASE, LUMBAR 12/16/2008  . DIVERTICULOSIS, COLON 09/30/2008  . DYSPNEA 07/13/2008  . Graves disease   . History of embolic stroke 7/56/4332   Left brain  . HYPERLIPIDEMIA 03/06/2007  . HYPERTENSION 03/06/2007  . HYPOTHYROIDISM 10/13/2007  . OSTEOARTHRITIS 03/06/2007  . Personal history of colonic polyps 09/30/2008  . Stroke ([place])    06/2014           . TOBACCO USE, QUIT 04/12/2009  . WEAKNESS 11/09/2007   Allergies  Allergen Reactions  . Catapres [Clonidine Hcl] Other (See Comments)    Made pt feel horrible, shaky, weak and nausea  . Lisinopril Anaphylaxis    Tongue swelling  . Labetalol Hcl Other (See Comments)    Caused bradycardia and syncope    Social History   Socioeconomic History  . Marital status: Divorced    Spouse name: Not on file  . Number of children: 4  . Years of education: college  . Highest education level: Not on file  Occupational History  . Occupation: retired  Scientific laboratory technician  . Financial resource strain: Not on file  . Food insecurity:    Worry: Not on file    Inability: Not on file  . Transportation needs:    Medical: Not on file    Non-medical: Not on file  Tobacco Use  . Smoking status: Former Smoker    Packs/day: 1.50    Years: 29.00    Pack years: 43.50    Types: Cigarettes    Start date: 07/08/1949    Last attempt to quit: 07/08/1978    Years since quitting: 39.7  . Smokeless tobacco: Never Used  Substance and Sexual Activity  . Alcohol use: Yes    Alcohol/week: 1.0 standard drinks    Types: 1 Glasses of wine per week    Comment: "red wine"- some nights  . Drug use: No  . Sexual activity: Not on file  Lifestyle  . Physical activity:    Days per week: Not on file    Minutes per  session: Not on file  . Stress: Not on file  Relationships  . Social connections:    Talks on phone: Not on file    Gets together: Not on file    Attends religious service: Not on file    Active member of club or organization: Not on file    Attends meetings of clubs or organizations: Not on file    Relationship status: Not on file  Other Topics Concern  . Not on file  Social History Narrative      Patient is right handed.   Patient drinks 1 cup caffeine daily.  Vitals:   03/25/18 1109  BP: 140/70  Pulse: 80  Resp: 16  Temp: 97.6 F (36.4 C)  SpO2: 94%   Body mass index is 21.35 kg/m.   Physical Exam  Nursing note and vitals reviewed. Constitutional: She is oriented to person, place, and time. She appears well-developed. No distress.  HENT:  Head: Normocephalic and atraumatic.  Mouth/Throat: Oropharynx is clear and moist and mucous membranes are normal.  Eyes: Pupils are equal, round, and reactive to light. Conjunctivae are normal.  Cardiovascular: Normal rate and regular rhythm.  Murmur (SEM II/VI RUSB-LUSB) heard. Respiratory: Effort normal. No respiratory distress. She has decreased breath sounds in the right lower field.  GI: Soft. She exhibits no mass. There is no hepatomegaly. There is no tenderness.  Musculoskeletal: She exhibits no edema.  Lymphadenopathy:    She has no cervical adenopathy.  Neurological: She is alert and oriented to person, place, and time.  No focal deficit appreciated. Mildly unstable gait.  Skin: Skin is warm. No rash noted. No erythema.  Psychiatric: Her mood appears anxious.  Fairly groomed, good eye contact.    ASSESSMENT AND PLAN:  Ms. Disney was seen today for no energy and poor appetite.  Diagnoses and all orders for this visit:  Lab Results  Component Value Date   WBC 3.5 (L) 03/25/2018   HGB 13.1 03/25/2018   HCT 39.0 03/25/2018   MCV 92.0 03/25/2018   PLT 223.0 03/25/2018   Lab Results  Component Value Date    CREATININE 1.04 03/25/2018   BUN 12 03/25/2018   NA 138 03/25/2018   K 3.8 03/25/2018   CL 102 03/25/2018   CO2 28 03/25/2018     Dyspnea, unspecified type  She is not on respiratory distress. We discussed possible etiologies, including some of her chronic medical problems. ?  Left basilar hypoventilation on lung auscultation, no rales or rhonchi. Further recommendations will be given according to labs and imaging results. Instructed about warning signs. Follow-up with PCP in 1 week.  -     CBC with Differential/Platelet -     Basic metabolic panel -     DG Chest 2 View; Future  Fatigue, unspecified type  Some of her chronic medical problems could aggravate/cause this problem. Last CBC showed mild anemia.  Instructed to monitor for new symptoms. Adequate hydration. Further recommendations will be given according to lab results.  -     CBC with Differential/Platelet -     Basic metabolic panel    Return in about 1 week (around 04/01/2018) for PCP.      Johnasia Liese G. Martinique, MD  [facility]. [place] office.

## 2018-03-26 NOTE — Telephone Encounter (Signed)
[name]'s Pt 

## 2018-03-27 ENCOUNTER — Encounter: Payer: Self-pay | Admitting: Family Medicine

## 2018-03-28 ENCOUNTER — Encounter: Payer: Self-pay | Admitting: Family Medicine

## 2018-03-30 ENCOUNTER — Other Ambulatory Visit: Payer: Self-pay

## 2018-03-30 ENCOUNTER — Telehealth: Payer: Self-pay | Admitting: Pulmonary Disease

## 2018-03-30 ENCOUNTER — Encounter (HOSPITAL_COMMUNITY): Payer: Self-pay

## 2018-03-30 ENCOUNTER — Observation Stay (HOSPITAL_COMMUNITY)
Admission: EM | Admit: 2018-03-30 | Discharge: 2018-03-31 | Disposition: A | Discharge status: Home / Self Care | Payer: Medicare Other | Attending: Family Medicine | Admitting: Family Medicine

## 2018-03-30 ENCOUNTER — Emergency Department (HOSPITAL_COMMUNITY): Payer: Medicare Other

## 2018-03-30 DIAGNOSIS — Z7951 Long term (current) use of inhaled steroids: Secondary | ICD-10-CM | POA: Diagnosis not present

## 2018-03-30 DIAGNOSIS — Z79899 Other long term (current) drug therapy: Secondary | ICD-10-CM | POA: Insufficient documentation

## 2018-03-30 DIAGNOSIS — I2510 Atherosclerotic heart disease of native coronary artery without angina pectoris: Secondary | ICD-10-CM | POA: Diagnosis not present

## 2018-03-30 DIAGNOSIS — E039 Hypothyroidism, unspecified: Secondary | ICD-10-CM | POA: Diagnosis present

## 2018-03-30 DIAGNOSIS — J441 Chronic obstructive pulmonary disease with (acute) exacerbation: Secondary | ICD-10-CM | POA: Diagnosis not present

## 2018-03-30 DIAGNOSIS — N183 Chronic kidney disease, stage 3 unspecified: Secondary | ICD-10-CM | POA: Diagnosis present

## 2018-03-30 DIAGNOSIS — J9601 Acute respiratory failure with hypoxia: Secondary | ICD-10-CM | POA: Diagnosis not present

## 2018-03-30 DIAGNOSIS — E782 Mixed hyperlipidemia: Secondary | ICD-10-CM | POA: Diagnosis present

## 2018-03-30 DIAGNOSIS — Z8673 Personal history of transient ischemic attack (TIA), and cerebral infarction without residual deficits: Secondary | ICD-10-CM | POA: Diagnosis not present

## 2018-03-30 DIAGNOSIS — R0602 Shortness of breath: Secondary | ICD-10-CM | POA: Diagnosis present

## 2018-03-30 DIAGNOSIS — Z87891 Personal history of nicotine dependence: Secondary | ICD-10-CM | POA: Insufficient documentation

## 2018-03-30 DIAGNOSIS — I129 Hypertensive chronic kidney disease with stage 1 through stage 4 chronic kidney disease, or unspecified chronic kidney disease: Secondary | ICD-10-CM | POA: Diagnosis not present

## 2018-03-30 DIAGNOSIS — E876 Hypokalemia: Secondary | ICD-10-CM | POA: Diagnosis present

## 2018-03-30 DIAGNOSIS — N1832 Chronic kidney disease, stage 3b: Secondary | ICD-10-CM | POA: Diagnosis present

## 2018-03-30 DIAGNOSIS — E785 Hyperlipidemia, unspecified: Secondary | ICD-10-CM | POA: Diagnosis not present

## 2018-03-30 DIAGNOSIS — Z7982 Long term (current) use of aspirin: Secondary | ICD-10-CM | POA: Diagnosis not present

## 2018-03-30 DIAGNOSIS — I10 Essential (primary) hypertension: Secondary | ICD-10-CM | POA: Diagnosis present

## 2018-03-30 LAB — BLOOD GAS, VENOUS
Acid-Base Excess: 0.1 mmol/L (ref 0.0–2.0)
Bicarbonate: 24.6 mmol/L (ref 20.0–28.0)
FIO2: 21
O2 Saturation: 68.5 %
PATIENT TEMPERATURE: 98.6
PH VEN: 7.389 (ref 7.250–7.430)
PO2 VEN: 39.7 mmHg (ref 32.0–45.0)
pCO2, Ven: 41.7 mmHg — ABNORMAL LOW (ref 44.0–60.0)

## 2018-03-30 LAB — BASIC METABOLIC PANEL
Anion gap: 13 (ref 5–15)
BUN: 17 mg/dL (ref 8–23)
CHLORIDE: 102 mmol/L (ref 98–111)
CO2: 23 mmol/L (ref 22–32)
CREATININE: 1.19 mg/dL — AB (ref 0.44–1.00)
Calcium: 9.1 mg/dL (ref 8.9–10.3)
GFR calc Af Amer: 45 mL/min — ABNORMAL LOW (ref >60)
GFR calc non Af Amer: 39 mL/min — ABNORMAL LOW (ref >60)
GLUCOSE: 132 mg/dL — AB (ref 70–99)
Potassium: 3.4 mmol/L — ABNORMAL LOW (ref 3.5–5.1)
SODIUM: 138 mmol/L (ref 135–145)

## 2018-03-30 LAB — CBC WITH DIFFERENTIAL/PLATELET
Basophils Absolute: 0 10*3/uL (ref 0.0–0.1)
Basophils Relative: 1 %
EOS ABS: 0.2 10*3/uL (ref 0.0–0.7)
EOS PCT: 4 %
HCT: 37.8 % (ref 36.0–46.0)
Hemoglobin: 12.4 g/dL (ref 12.0–15.0)
LYMPHS ABS: 1.3 10*3/uL (ref 0.7–4.0)
LYMPHS PCT: 29 %
MCH: 30.7 pg (ref 26.0–34.0)
MCHC: 32.8 g/dL (ref 30.0–36.0)
MCV: 93.6 fL (ref 78.0–100.0)
MONO ABS: 0.3 10*3/uL (ref 0.1–1.0)
MONOS PCT: 7 %
Neutro Abs: 2.7 10*3/uL (ref 1.7–7.7)
Neutrophils Relative %: 59 %
Platelets: 244 10*3/uL (ref 150–400)
RBC: 4.04 MIL/uL (ref 3.87–5.11)
RDW: 13.5 % (ref 11.5–15.5)
WBC: 4.5 10*3/uL (ref 4.0–10.5)

## 2018-03-30 LAB — I-STAT TROPONIN, ED: TROPONIN I, POC: 0 ng/mL (ref 0.00–0.08)

## 2018-03-30 MED ORDER — IPRATROPIUM BROMIDE 0.02 % IN SOLN
0.5000 mg | Freq: Once | RESPIRATORY_TRACT | Status: AC
  Administered 2018-03-30: 0.5 mg via RESPIRATORY_TRACT
  Filled 2018-03-30: qty 2.5

## 2018-03-30 MED ORDER — MAGNESIUM SULFATE 2 GM/50ML IV SOLN
2.0000 g | Freq: Once | INTRAVENOUS | Status: AC
  Administered 2018-03-30: 2 g via INTRAVENOUS
  Filled 2018-03-30: qty 50

## 2018-03-30 MED ORDER — ALBUTEROL (5 MG/ML) CONTINUOUS INHALATION SOLN
10.0000 mg/h | INHALATION_SOLUTION | Freq: Once | RESPIRATORY_TRACT | Status: AC
  Administered 2018-03-30: 10 mg/h via RESPIRATORY_TRACT
  Filled 2018-03-30: qty 20

## 2018-03-30 NOTE — Telephone Encounter (Signed)
Called Margaretha Sheffield, unable to reach left message to give [place] a call back.

## 2018-03-30 NOTE — ED Provider Notes (Signed)
[facility] DEPT Provider Note   CSN: [id number] Arrival date & time: 03/30/18  1839     History   Chief Complaint Chief Complaint  Patient presents with  . Shortness of Breath    HPI [name] is a 84 y.o. female with history of COPD, hypothyroidism, hypertension, hyperlipidemia, CVA brought to the ER by EMS for evaluation of shortness of breath.  History is obtained directly from patient and from bedside family member who called EMS.  Reports severe shortness of breath that began around 4 PM today.  States she felt well all day and was able to do all of her chores.  She took her nebulizing treatment at 4 PM and 15 to 20 minutes afterwards developed sudden, severe shortness of breath.  She was trying to fan herself to help her breathing.  States that she had a hard time talking due to shortness of breath.  Associate symptoms include slight, dry cough.  EMS gave her breathing treatment, Solu-Medrol and she feels slightly better.  Still notes some shortness of breath with talking.  States recently her pulmonologist told her that she could cut back on her breathing treatments from twice daily to daily.  Additionally states that she was outside a lot going to [employer] and doing chores.  She denies any sick contacts.  No associated fevers, chills, sweats, chest pain, nausea, vomiting, abdominal pain, leg swelling or calf pain.  She was seen by her PCP last week for a "flare" of her COPD and had a normal chest x-ray.  Daughter at bedside states patient COPD has significantly worsened in the last several months.  She has been admitted to the hospital almost monthly since May.  HPI  Past Medical History:  Diagnosis Date  . Anxiety   . Aortic arch atherosclerosis ([place]) 06/24/2014  . Atherosclerotic ulcer of aorta ([place]) 06/24/2014  . Carotid artery occlusion   . [place] DISEASE, LUMBAR 12/16/2008  . DIVERTICULOSIS, COLON 09/30/2008  . DYSPNEA 07/13/2008  . Graves  disease   . History of embolic stroke 01/17/4579   Left brain  . HYPERLIPIDEMIA 03/06/2007  . HYPERTENSION 03/06/2007  . HYPOTHYROIDISM 10/13/2007  . OSTEOARTHRITIS 03/06/2007  . Personal history of colonic polyps 09/30/2008  . Stroke ([place])    06/2014           . TOBACCO USE, QUIT 04/12/2009  . WEAKNESS 11/09/2007    Patient Active Problem List   Diagnosis Date Noted  . Hypokalemia 03/30/2018  . COPD with acute exacerbation ([place]) 03/02/2018  . SOB (shortness of breath) 01/12/2018  . CKD (chronic kidney disease), stage III ([place]) 01/12/2018  . New onset left bundle branch block (LBBB) 01/12/2018  . COPD (chronic obstructive pulmonary disease) ([place]) 01/12/2018  . Left buttock pain 09/13/2017  . Symptomatic anemia   . AVM (arteriovenous malformation) of duodenum, acquired with hemorrhage   . Acute GI bleeding 10/09/2016  . Other emphysema ([place]) 05/10/2016  . Recurrent laryngeal neuropathy 02/01/2016  . Nausea with vomiting 12/11/2015  . Left carotid stenosis 11/21/2015  . Asymptomatic carotid artery stenosis 11/13/2015  . Impaired glucose tolerance 10/27/2015  . Solitary pulmonary nodule 10/27/2015  . Thyroid nodule 10/27/2015  . Syncope 10/04/2015  . Bradycardia with less than 60 beats per minute 10/04/2015  . History of embolic stroke 99/83/3825  . Paresthesia of both feet 07/06/2014  . Aortic arch atherosclerosis ([place]) 06/24/2014  . Atherosclerotic ulcer of aorta ([place]) 06/24/2014  . Stroke ([place]) 06/22/2014  . Bilateral  carotid artery disease ([place]) 05/18/2014  . TOBACCO USE, QUIT 04/12/2009  . [place] DISEASE, LUMBAR 12/16/2008  . DIVERTICULOSIS, COLON 09/30/2008  . Personal history of colonic polyps 09/30/2008  . DYSPNEA 07/13/2008  . Weakness 11/09/2007  . Hypothyroidism 10/13/2007  . Hyperlipidemia 03/06/2007  . Essential hypertension 03/06/2007  . Osteoarthritis 03/06/2007    Past Surgical History:  Procedure Laterality Date  . CARDIAC CATHETERIZATION    . CATARACT  EXTRACTION Bilateral   . CHOLECYSTECTOMY    . ENDARTERECTOMY Left 11/13/2015   Procedure: LEFT CAROTID ARTERY ENDARTERECTOMY;  Surgeon: Conrad Tyler, MD;  Location: [facility];  Service: Vascular;  Laterality: Left;  . ESOPHAGOGASTRODUODENOSCOPY (EGD) WITH PROPOFOL N/A 10/11/2016   Procedure: ESOPHAGOGASTRODUODENOSCOPY (EGD) WITH PROPOFOL;  Surgeon: Mauri Pole, MD;  Location: [facility] END[facility];  Service: Endoscopy;  Laterality: N/A;  . KNEE SURGERY    . PATCH ANGIOPLASTY Left 11/13/2015   Procedure: WITH 1CM X 6CM  XENOSURE BIOLOGIC PATCH ANGIOPLASTY;  Surgeon: Conrad Winthrop Harbor, MD;  Location: [facility];  Service: Vascular;  Laterality: Left;  . TEE WITHOUT CARDIOVERSION N/A 06/24/2014   Procedure: TRANSESOPHAGEAL ECHOCARDIOGRAM (TEE);  Surgeon: Sanda Klein, MD;  Location: [facility] ENDOSCOPY;  Service: Cardiovascular;  Laterality: N/A;     OB History   None      Home Medications    Prior to Admission medications   Medication Sig Start Date End Date Taking? Authorizing Provider  albuterol (PROAIR HFA) 108 (90 Base) MCG/ACT inhaler Inhale 2 puffs into the lungs every 6 (six) hours as needed for wheezing or shortness of breath. 11/13/17  Yes Providence Lanius A, PA-C  amLODipine (NORVASC) 10 MG tablet Take 1 tablet (10 mg total) by mouth daily. 02/23/18  Yes Burtis Junes, NP  aspirin EC 81 MG tablet Take 1 tablet (81 mg total) by mouth daily. 11/23/15  Yes Virgina Jock A, PA-C  atorvastatin (LIPITOR) 80 MG tablet TAKE 1 TABLET BY MOUTH EVERY DAY AT 6PM Patient taking differently: Take 80 mg by mouth daily at 6 PM.  08/05/17  Yes Marletta Lor, MD  Fluticasone-Umeclidin-Vilant (TRELEGY ELLIPTA) 100-62.5-25 MCG/INH AEPB Inhale 1 puff into the lungs daily.   Yes [provider]  Glycerin-Hypromellose-PEG 400 ([facility] DRY EYE RELIEF) 0.2-0.2-1 % SOLN Place 1 drop into both eyes as needed (dry eyes).    Yes [provider]  hydrALAZINE (APRESOLINE) 25 MG tablet TAKE 3 TABLETS BY MOUTH 3 TIMES  A DAY Patient taking differently: Take 75 mg by mouth 3 (three) times daily.  11/17/17  Yes Josue Hector, MD  ipratropium-albuterol (DUONEB) 0.5-2.5 (3) MG/3ML SOLN Take 3 mLs by nebulization every 6 (six) hours as needed. 03/13/18  Yes Martyn Ehrich, NP  levothyroxine (SYNTHROID, LEVOTHROID) 75 MCG tablet Take 1 tablet (75 mcg total) by mouth daily before breakfast. 03/30/18  Yes Nafziger, Tommi Rumps, NP  Multiple Vitamin (MULTIVITAMIN) tablet Take 1 tablet by mouth every evening.   Yes [provider]  predniSONE (DELTASONE) 10 MG tablet Take 4 tabs po daily x 2 days; then 3 tabs for 2 days; then 2 tabs for 2 days; then 1 tab for 2 days Patient not taking: Reported on 03/30/2018 03/13/18   Martyn Ehrich, NP  Spacer/Aero-Holding Chambers (AEROCHAMBER MV) inhaler Use as instructed Patient not taking: Reported on 03/30/2018 11/26/17   Magdalen Spatz, NP    Family History Family History  Problem Relation Age of Onset  . Stomach cancer Maternal Grandmother   . Colon cancer Neg Hx  Social History Social History   Tobacco Use  . Smoking status: Former Smoker    Packs/day: 1.50    Years: 29.00    Pack years: 43.50    Types: Cigarettes    Start date: 07/08/1949    Last attempt to quit: 07/08/1978    Years since quitting: 39.7  . Smokeless tobacco: Never Used  Substance Use Topics  . Alcohol use: Yes    Alcohol/week: 1.0 standard drinks    Types: 1 Glasses of wine per week    Comment: "red wine"- some nights  . Drug use: No     Allergies   Catapres [clonidine hcl]; Lisinopril; and Labetalol hcl   Review of Systems Review of Systems  Respiratory: Positive for cough and shortness of breath.   All other systems reviewed and are negative.    Physical Exam Updated Vital Signs BP (!) 133/56 (BP Location: Right Arm)   Pulse 92   Temp 97.7 F (36.5 C) (Oral)   Resp 16   SpO2 98%   Physical Exam  Constitutional: She is oriented to person, place, and time. She appears  well-developed and well-nourished.  Elderly female, in no distress.  Nontoxic.  HENT:  Head: Normocephalic and atraumatic.  Nose: Nose normal.  Moist mucous membranes.  Eyes: Pupils are equal, round, and reactive to light. Conjunctivae and EOM are normal.  Neck: Normal range of motion.  Cardiovascular: Normal rate and regular rhythm.  Pulmonary/Chest: She is in respiratory distress. She has wheezes in the right lower field and the left lower field.  Mild respiratory distress.  Patient appears to be winded with talking.  Intermittent pursing of her lips.  Abdominal: Soft. Bowel sounds are normal. There is no tenderness.  No G/R/R. No suprapubic or CVA tenderness. Negative Murphy's and McBurney's. Active BS to lower quadrants.   Musculoskeletal: Normal range of motion.  Neurological: She is alert and oriented to person, place, and time.  Skin: Skin is warm and dry. Capillary refill takes less than 2 seconds.  Psychiatric: She has a normal mood and affect. Her behavior is normal.  Nursing note and vitals reviewed.    ED Treatments / Results  Labs (all labs ordered are listed, but only abnormal results are displayed) Labs Reviewed  BASIC METABOLIC PANEL - Abnormal; Notable for the following components:      Result Value   Potassium 3.4 (*)    Glucose, Bld 132 (*)    Creatinine, Ser 1.19 (*)    GFR calc non Af Amer 39 (*)    GFR calc Af Amer 45 (*)    All other components within normal limits  BLOOD GAS, VENOUS - Abnormal; Notable for the following components:   pCO2, Ven 41.7 (*)    All other components within normal limits  CBC WITH DIFFERENTIAL/PLATELET  I-STAT TROPONIN, ED    EKG EKG Interpretation  Date/Time:  Monday March 30 2018 18:58:40 EDT Ventricular Rate:  89 PR Interval:    QRS Duration: 116 QT Interval:  401 QTC Calculation: 488 R Axis:   -46 Text Interpretation:  Sinus rhythm Ventricular premature complex LAD, consider left anterior fascicular block LVH  with secondary repolarization abnormality Anterior infarct, old Confirmed by Lacretia Leigh ([id number]) on 03/30/2018 10:02:27 PM   Radiology Dg Chest 2 View  Result Date: 03/30/2018 CLINICAL DATA:  COPD flare.  Increasing shortness of breath. EXAM: CHEST - 2 VIEW COMPARISON:  03/25/2018 FINDINGS: Stable hyperinflation and emphysematous changes. Chronic densities in the right lower chest. Stable  nodular density in the mid left chest which is unchanged since 12/11/2015. No new airspace disease or lung consolidation. Heart and mediastinum are within normal limits and stable. Atherosclerotic calcifications at the aortic arch. No large pleural effusions. IMPRESSION: No active cardiopulmonary disease. Stable emphysematous changes. Electronically Signed   By: Markus Daft M.D.   On: 03/30/2018 20:15    Procedures Procedures (including critical care time)  Medications Ordered in ED Medications  albuterol (PROVENTIL,VENTOLIN) solution continuous neb (10 mg/hr Nebulization Given 03/30/18 2024)  ipratropium (ATROVENT) nebulizer solution 0.5 mg (0.5 mg Nebulization Given 03/30/18 2024)  magnesium sulfate IVPB 2 g 50 mL (0 g Intravenous Stopped 03/30/18 2237)     Initial Impression / Assessment and Plan / ED Course  I have reviewed the triage vital signs and the nursing notes.  Pertinent labs & imaging results that were available during my care of the patient were reviewed by me and considered in my medical decision making (see chart for details).      84 year old here with shortness of breath.  In setting of advanced COPD.  She has no chest pain, does not look fluid overloaded.  No lower extremity swelling or calf tenderness.  I doubt PE.  No history of heart failure and she does not look fluid overloaded today.  No infection symptoms.  Recent admission 1 month ago for COPD exacerbation.  I recommended screening labs, chest x-ray, EKG and patient and daughter are in agreement.  She has received 1 breathing  treatment on route, Solu-Medrol by EMS and continues to feel short of breath with talking.  Diffuse wheezing to lower lobes.  She is HD stable.  No hypoxia on room air at rest.  Will give mag sulfate and continuous breathing treatment and reassess.  2300: Patient ambulated in hall with O2 dropping to 92%, she was very winded and states she feels worse, the same way she felt when 911 was called.  She has received DuoNeb x1, continues breathing treatment, so Medrol, magnesium and symptoms are refractory.  Discussed with hospitalist who will admit patient for COPD exacerbation.  Patient does not use oxygen at home and does not have oxygen available.  Work-up remarkable for creatinine 1.19, GFR 39 slightly elevated from baseline.  She is HD stable. Final Clinical Impressions(s) / ED Diagnoses   Final diagnoses:  COPD exacerbation [facility])    ED Discharge Orders    None       Arlean Hopping 03/30/18 2309    Lacretia Leigh, MD 03/30/18 [phone]

## 2018-03-30 NOTE — Progress Notes (Signed)
Peak flow pre neb treatment was 180x3

## 2018-03-30 NOTE — Telephone Encounter (Signed)
Called [name], unable to reach left message to give [place] a call back.

## 2018-03-30 NOTE — H&P (Signed)
History and Physical   [name] NGE:[id number] DOB: [birth year] DOA: 03/30/2018  Referring MD/NP/PA: Dr. Vivi Martens  PCP: [name], NP   Outpatient Specialists: [facility]  Patient coming from: Home  Chief Complaint: Shortness of breath  HPI: [name] is a 84 y.o. female with medical history significant of COPD diagnosed about 2 years ago, remote tobacco use she quit about 40 years ago, coronary artery disease, anxiety disorder.  GERD, carotid artery disease, hypertension who has had multiple hospitalizations this year with COPD exacerbation.  Patient was doing well at home until this afternoon when she started having significant shortness of breath and cough.  She used her nebulizer but still felt tight.  She is not on home O2.  In the ER she was noted to be hypoxic requiring  Oxygen.  Her shortness of breath is worsening with little activity.  She is therefore being admitted for treatment of exacerbation of COPD.  ED Course: Patient's temperature is 97.7 with blood pressure 150/69.  Pulse is 92 respirate of 18 oxygen sat 97% on 2 L.  Her white count is 4.5 hemoglobin 12.4 platelet count of 244.  Sodium 138 potassium 3.4 with chloride 102 BUN 17 creatinine 1.19 and glucose 132. Venous pH is 7.389, chest x-ray showed no active disease patient has received IV Solu-Medrol with nebulizer in the ER and magnesium.  She is being admitted for treatment  Review of Systems: As per HPI otherwise 10 point review of systems negative.    Past Medical History:  Diagnosis Date  . Anxiety   . Aortic arch atherosclerosis ([place]) 06/24/2014  . Atherosclerotic ulcer of aorta ([place]) 06/24/2014  . Carotid artery occlusion   . [place] DISEASE, LUMBAR 12/16/2008  . DIVERTICULOSIS, COLON 09/30/2008  . DYSPNEA 07/13/2008  . Graves disease   . History of embolic stroke 10/07/270   Left brain  . HYPERLIPIDEMIA 03/06/2007  . HYPERTENSION 03/06/2007  . HYPOTHYROIDISM 10/13/2007  . OSTEOARTHRITIS  03/06/2007  . Personal history of colonic polyps 09/30/2008  . Stroke ([place])    06/2014           . TOBACCO USE, QUIT 04/12/2009  . WEAKNESS 11/09/2007    Past Surgical History:  Procedure Laterality Date  . CARDIAC CATHETERIZATION    . CATARACT EXTRACTION Bilateral   . CHOLECYSTECTOMY    . ENDARTERECTOMY Left 11/13/2015   Procedure: LEFT CAROTID ARTERY ENDARTERECTOMY;  Surgeon: Conrad Ringgold, MD;  Location: [facility];  Service: Vascular;  Laterality: Left;  . ESOPHAGOGASTRODUODENOSCOPY (EGD) WITH PROPOFOL N/A 10/11/2016   Procedure: ESOPHAGOGASTRODUODENOSCOPY (EGD) WITH PROPOFOL;  Surgeon: Mauri Pole, MD;  Location: [facility];  Service: Endoscopy;  Laterality: N/A;  . KNEE SURGERY    . PATCH ANGIOPLASTY Left 11/13/2015   Procedure: WITH 1CM X 6CM  XENOSURE BIOLOGIC PATCH ANGIOPLASTY;  Surgeon: Conrad Carrier, MD;  Location: [facility];  Service: Vascular;  Laterality: Left;  . TEE WITHOUT CARDIOVERSION N/A 06/24/2014   Procedure: TRANSESOPHAGEAL ECHOCARDIOGRAM (TEE);  Surgeon: Sanda Klein, MD;  Location: [facility] ENDOSCOPY;  Service: Cardiovascular;  Laterality: N/A;     reports that she quit smoking about 39 years ago. Her smoking use included cigarettes. She started smoking about 68 years ago. She has a 43.50 pack-year smoking history. She has never used smokeless tobacco. She reports that she drinks about 1.0 standard drinks of alcohol per week. She reports that she does not use drugs.  Allergies  Allergen Reactions  . Catapres [Clonidine Hcl] Other (See  Comments)    Made pt feel horrible, shaky, weak and nausea  . Lisinopril Anaphylaxis    Tongue swelling  . Labetalol Hcl Other (See Comments)    Caused bradycardia and syncope    Family History  Problem Relation Age of Onset  . Stomach cancer Maternal Grandmother   . Colon cancer Neg Hx      Prior to Admission medications   Medication Sig Start Date End Date Taking? Authorizing Provider  albuterol (PROAIR HFA) 108 (90 Base) MCG/ACT  inhaler Inhale 2 puffs into the lungs every 6 (six) hours as needed for wheezing or shortness of breath. 11/13/17  Yes Providence Lanius A, PA-C  amLODipine (NORVASC) 10 MG tablet Take 1 tablet (10 mg total) by mouth daily. 02/23/18  Yes Burtis Junes, NP  aspirin EC 81 MG tablet Take 1 tablet (81 mg total) by mouth daily. 11/23/15  Yes Virgina Jock A, PA-C  atorvastatin (LIPITOR) 80 MG tablet TAKE 1 TABLET BY MOUTH EVERY DAY AT 6PM Patient taking differently: Take 80 mg by mouth daily at 6 PM.  08/05/17  Yes Marletta Lor, MD  Fluticasone-Umeclidin-Vilant (TRELEGY ELLIPTA) 100-62.5-25 MCG/INH AEPB Inhale 1 puff into the lungs daily.   Yes [provider]  Glycerin-Hypromellose-PEG 400 ([facility] DRY EYE RELIEF) 0.2-0.2-1 % SOLN Place 1 drop into both eyes as needed (dry eyes).    Yes [provider]  hydrALAZINE (APRESOLINE) 25 MG tablet TAKE 3 TABLETS BY MOUTH 3 TIMES A DAY Patient taking differently: Take 75 mg by mouth 3 (three) times daily.  11/17/17  Yes Josue Hector, MD  ipratropium-albuterol (DUONEB) 0.5-2.5 (3) MG/3ML SOLN Take 3 mLs by nebulization every 6 (six) hours as needed. 03/13/18  Yes Martyn Ehrich, NP  levothyroxine (SYNTHROID, LEVOTHROID) 75 MCG tablet Take 1 tablet (75 mcg total) by mouth daily before breakfast. 03/30/18  Yes Nafziger, Tommi Rumps, NP  Multiple Vitamin (MULTIVITAMIN) tablet Take 1 tablet by mouth every evening.   Yes [provider]  predniSONE (DELTASONE) 10 MG tablet Take 4 tabs po daily x 2 days; then 3 tabs for 2 days; then 2 tabs for 2 days; then 1 tab for 2 days Patient not taking: Reported on 03/30/2018 03/13/18   Martyn Ehrich, NP  Spacer/Aero-Holding Chambers (AEROCHAMBER MV) inhaler Use as instructed Patient not taking: Reported on 03/30/2018 11/26/17   Magdalen Spatz, NP    Physical Exam: Vitals:   03/30/18 1855 03/30/18 2236  BP: (!) 158/69 (!) 133/56  Pulse: 85 92  Resp: 18 16  Temp: 97.6 F (36.4 C) 97.7 F (36.5 C)   TempSrc:  Oral  SpO2: 97% 98%      Constitutional: NAD, calm, comfortable Vitals:   03/30/18 1855 03/30/18 2236  BP: (!) 158/69 (!) 133/56  Pulse: 85 92  Resp: 18 16  Temp: 97.6 F (36.4 C) 97.7 F (36.5 C)  TempSrc:  Oral  SpO2: 97% 98%   Patient appears chronically ill looking and frail Eyes: PERRL, lids and conjunctivae normal ENMT: Mucous membranes are moist. Posterior pharynx clear of any exudate or lesions.Normal dentition.  Neck: normal, supple, no masses, no thyromegaly Respiratory: Decreased air entry bilaterally with marked expiratory wheezing, use of extra muscle respiration Cardiovascular: Regular rate and rhythm, no murmurs / rubs / gallops. No extremity edema. 2+ pedal pulses. No carotid bruits.  Abdomen: no tenderness, no masses palpated. No hepatosplenomegaly. Bowel sounds positive.  Musculoskeletal: no clubbing / cyanosis. No joint deformity upper and lower extremities. Good ROM,  no contractures. Normal muscle tone.  Skin: no rashes, lesions, ulcers. No induration Neurologic: CN 2-12 grossly intact. Sensation intact, DTR normal. Strength 5/5 in all 4.  Psychiatric: Normal judgment and insight. Alert and oriented x 3. Normal mood.     Labs on Admission: I have personally reviewed following labs and imaging studies  CBC: Recent Labs  Lab 03/25/18 1138 03/30/18 1942  WBC 3.5* 4.5  NEUTROABS 2.3 2.7  HGB 13.1 12.4  HCT 39.0 37.8  MCV 92.0 93.6  PLT 223.0 244   Basic Metabolic Panel: Recent Labs  Lab 03/25/18 1138 03/30/18 1942  NA 138 138  K 3.8 3.4*  CL 102 102  CO2 28 23  GLUCOSE 118* 132*  BUN 12 17  CREATININE 1.04 1.19*  CALCIUM 8.8 9.1   GFR: Estimated Creatinine Clearance: 29.4 mL/min (A) (by C-G formula based on SCr of 1.19 mg/dL (H)). Liver Function Tests: No results for input(s): AST, ALT, ALKPHOS, BILITOT, PROT, ALBUMIN in the last 168 hours. No results for input(s): LIPASE, AMYLASE in the last 168 hours. No results for  input(s): AMMONIA in the last 168 hours. Coagulation Profile: No results for input(s): INR, PROTIME in the last 168 hours. Cardiac Enzymes: No results for input(s): CKTOTAL, CKMB, CKMBINDEX, TROPONINI in the last 168 hours. BNP (last 3 results) No results for input(s): PROBNP in the last 8760 hours. HbA1C: No results for input(s): HGBA1C in the last 72 hours. CBG: No results for input(s): GLUCAP in the last 168 hours. Lipid Profile: No results for input(s): CHOL, HDL, LDLCALC, TRIG, CHOLHDL, LDLDIRECT in the last 72 hours. Thyroid Function Tests: No results for input(s): TSH, T4TOTAL, FREET4, T3FREE, THYROIDAB in the last 72 hours. Anemia Panel: No results for input(s): VITAMINB12, FOLATE, FERRITIN, TIBC, IRON, RETICCTPCT in the last 72 hours. Urine analysis:    Component Value Date/Time   COLORURINE YELLOW 03/02/2018 1346   APPEARANCEUR CLEAR 03/02/2018 1346   LABSPEC 1.010 03/02/2018 1346   PHURINE 6.0 03/02/2018 1346   GLUCOSEU NEGATIVE 03/02/2018 1346   GLUCOSEU NEGATIVE 12/11/2015 1216   HGBUR NEGATIVE 03/02/2018 1346   BILIRUBINUR NEGATIVE 03/02/2018 1346   BILIRUBINUR n 08/17/2012 1455   KETONESUR NEGATIVE 03/02/2018 1346   PROTEINUR NEGATIVE 03/02/2018 1346   UROBILINOGEN 0.2 12/11/2015 1216   NITRITE NEGATIVE 03/02/2018 1346   LEUKOCYTESUR NEGATIVE 03/02/2018 1346   Sepsis Labs: @LABRCNTIP (procalcitonin:4,lacticidven:4) )No results found for this or any previous visit (from the past 240 hour(s)).   Radiological Exams on Admission: Dg Chest 2 View  Result Date: 03/30/2018 CLINICAL DATA:  COPD flare.  Increasing shortness of breath. EXAM: CHEST - 2 VIEW COMPARISON:  03/25/2018 FINDINGS: Stable hyperinflation and emphysematous changes. Chronic densities in the right lower chest. Stable nodular density in the mid left chest which is unchanged since 12/11/2015. No new airspace disease or lung consolidation. Heart and mediastinum are within normal limits and stable.  Atherosclerotic calcifications at the aortic arch. No large pleural effusions. IMPRESSION: No active cardiopulmonary disease. Stable emphysematous changes. Electronically Signed   By: Markus Daft M.D.   On: 03/30/2018 20:15    EKG: Independently reviewed.  It shows normal sinus rhythm with evidence of LVH, evidence of previous infarct but unchanged EKG from previous  Assessment/Plan Principal Problem:   COPD with acute exacerbation (HCC) Active Problems:   Hypothyroidism   Hyperlipidemia   Essential hypertension   CKD (chronic kidney disease), stage III (HCC)   Hypokalemia     #1 acute exacerbation of COPD: Patient will be admitted  for inpatient treatment.  She is requiring oxygen at the moment.  IV steroids with nebulizer will continue.  Also antibiotics.  Will initiate cefepime due to chronic recurrent hospitalizations.  Evaluate patient for possible home oxygen.  #2 hypertension: Continue with blood pressure medicine from home.  #3 chronic kidney disease stage III: BUN/creatinine seem to be at baseline.  #4 hypokalemia: Probably due to nebulizer treatment.  Replete with  #5 hypothyroidism: Continue with levothyroxine.   DVT prophylaxis: Lovenox Code Status: Full code Family Communication: Daughter who is with patient in the room Disposition Plan: Home Consults called: None Admission status: Observation  Severity of Illness: The appropriate patient status for this patient is OBSERVATION. Observation status is judged to be reasonable and necessary in order to provide the required intensity of service to ensure the patient's safety. The patient's presenting symptoms, physical exam findings, and initial radiographic and laboratory data in the context of their medical condition is felt to place them at decreased risk for further clinical deterioration. Furthermore, it is anticipated that the patient will be medically stable for discharge from the hospital within 2 midnights of  admission. The following factors support the patient status of observation.   " The patient's presenting symptoms include shortness of breath and cough. " The physical exam findings include bilateral expiratory wheezing. " The initial radiographic and laboratory data are normal chest x-ray.     Barbette Merino MD [facility] Pager [phone]  If 7PM-7AM, please contact night-coverage [email or web address] Password [facility]  03/30/2018, 10:54 PM

## 2018-03-30 NOTE — ED Notes (Signed)
Bed: CN63 Expected date:  Expected time:  Means of arrival:  Comments: EMS-COPD

## 2018-03-30 NOTE — ED Provider Notes (Signed)
Medical screening examination/treatment/procedure(s) were conducted as a shared visit with non-physician practitioner(s) and myself.  I personally evaluated the patient during the encounter.  EKG Interpretation  Date/Time:  Monday March 30 2018 18:58:40 EDT Ventricular Rate:  89 PR Interval:    QRS Duration: 116 QT Interval:  401 QTC Calculation: 488 R Axis:   -46 Text Interpretation:  Sinus rhythm Ventricular premature complex LAD, consider left anterior fascicular block LVH with secondary repolarization abnormality Anterior infarct, old Confirmed by Lacretia Leigh [phone]) on 03/30/2018 10:1:40 PM 84 year old female with history of COPD presents with acute dyspnea and multiple admissions for COPD exacerbation.  Was treated with albuterol and Solu-Medrol magnesium.  Was feeling well until she was ambulated here in the department and then became more dyspneic.  Does have some expiratory wheezing.  Will admit to the hospitalist service.   Lacretia Leigh, MD 03/30/18 2222

## 2018-03-30 NOTE — ED Triage Notes (Signed)
EMS reports from home, Hx of COPD, increasing SOB since May with several ED visits. 2 weeks ago Pulmonologist added Atrovent. Used nebulizer today with no relief.  BP 161/95 HR 84 RR 28 Sp02 99 on nebulizer CBG 104  20ga LAC  10mg  Albuterol 1mg  Atrovent 125mg  Solumedrol Given enroute with result

## 2018-03-30 NOTE — ED Notes (Signed)
Ambulated patient well in hallway, however O2 dropped down too 92% and patient was very winded when she got back to her room. Patient states she feel exactly how she felt after having a breathing treatment prior to coming into hospital. Patient had a hour breathing treatment here in ED and states she still feels the same as she did before she was transported here. Patient currently has MAG running

## 2018-03-30 NOTE — Telephone Encounter (Signed)
Margaretha Sheffield returning call/kob

## 2018-03-30 NOTE — ED Notes (Signed)
Respiratory tech called for VBG and resp TX. VBG obtained and on ice in mini lab waiting testing

## 2018-03-31 ENCOUNTER — Other Ambulatory Visit: Payer: Self-pay

## 2018-03-31 ENCOUNTER — Telehealth: Payer: Self-pay | Admitting: Adult Health

## 2018-03-31 DIAGNOSIS — J441 Chronic obstructive pulmonary disease with (acute) exacerbation: Secondary | ICD-10-CM

## 2018-03-31 DIAGNOSIS — I10 Essential (primary) hypertension: Secondary | ICD-10-CM | POA: Diagnosis not present

## 2018-03-31 DIAGNOSIS — N183 Chronic kidney disease, stage 3 (moderate): Secondary | ICD-10-CM | POA: Diagnosis not present

## 2018-03-31 DIAGNOSIS — E039 Hypothyroidism, unspecified: Secondary | ICD-10-CM

## 2018-03-31 LAB — BASIC METABOLIC PANEL
Anion gap: 11 (ref 5–15)
BUN: 22 mg/dL (ref 8–23)
CALCIUM: 9.1 mg/dL (ref 8.9–10.3)
CO2: 23 mmol/L (ref 22–32)
Chloride: 102 mmol/L (ref 98–111)
Creatinine, Ser: 1.4 mg/dL — ABNORMAL HIGH (ref 0.44–1.00)
GFR calc Af Amer: 37 mL/min — ABNORMAL LOW (ref >60)
GFR, EST NON AFRICAN AMERICAN: 32 mL/min — AB (ref >60)
GLUCOSE: 235 mg/dL — AB (ref 70–99)
Potassium: 3.7 mmol/L (ref 3.5–5.1)
Sodium: 136 mmol/L (ref 135–145)

## 2018-03-31 LAB — CBC
HCT: 35.9 % — ABNORMAL LOW (ref 36.0–46.0)
Hemoglobin: 11.9 g/dL — ABNORMAL LOW (ref 12.0–15.0)
MCH: 31.2 pg (ref 26.0–34.0)
MCHC: 33.1 g/dL (ref 30.0–36.0)
MCV: 94.2 fL (ref 78.0–100.0)
PLATELETS: 222 10*3/uL (ref 150–400)
RBC: 3.81 MIL/uL — ABNORMAL LOW (ref 3.87–5.11)
RDW: 13.5 % (ref 11.5–15.5)
WBC: 3.2 10*3/uL — ABNORMAL LOW (ref 4.0–10.5)

## 2018-03-31 LAB — HIV ANTIBODY (ROUTINE TESTING W REFLEX): HIV Screen 4th Generation wRfx: NONREACTIVE

## 2018-03-31 LAB — PROCALCITONIN: Procalcitonin: <0.1 ng/mL

## 2018-03-31 MED ORDER — POLYVINYL ALCOHOL 1.4 % OP SOLN
1.0000 [drp] | OPHTHALMIC | Status: DC | PRN
  Filled 2018-03-31: qty 15

## 2018-03-31 MED ORDER — UMECLIDINIUM-VILANTEROL 62.5-25 MCG/INH IN AEPB
1.0000 | INHALATION_SPRAY | Freq: Every day | RESPIRATORY_TRACT | Status: DC
  Filled 2018-03-31: qty 14

## 2018-03-31 MED ORDER — FLUTICASONE-UMECLIDIN-VILANT 100-62.5-25 MCG/INH IN AEPB: 1.0000 | INHALATION_SPRAY | Freq: Every day | RESPIRATORY_TRACT | Status: DC

## 2018-03-31 MED ORDER — METHYLPREDNISOLONE SODIUM SUCC 40 MG IJ SOLR
40.0000 mg | Freq: Four times a day (QID) | INTRAMUSCULAR | Status: DC
  Administered 2018-03-31 (×2): 40 mg via INTRAVENOUS
  Filled 2018-03-31 (×2): qty 1

## 2018-03-31 MED ORDER — PREDNISONE 20 MG PO TABS: 40.0000 mg | ORAL_TABLET | Freq: Every day | ORAL | 0 refills | Status: AC

## 2018-03-31 MED ORDER — AMLODIPINE BESYLATE 10 MG PO TABS
10.0000 mg | ORAL_TABLET | Freq: Every day | ORAL | Status: DC
  Administered 2018-03-31: 10 mg via ORAL
  Filled 2018-03-31: qty 1

## 2018-03-31 MED ORDER — ENSURE ENLIVE PO LIQD: 237.0000 mL | ORAL | Status: DC

## 2018-03-31 MED ORDER — PREDNISONE 20 MG PO TABS: 40.0000 mg | ORAL_TABLET | Freq: Every day | ORAL | Status: DC

## 2018-03-31 MED ORDER — HYDRALAZINE HCL 50 MG PO TABS
75.0000 mg | ORAL_TABLET | Freq: Three times a day (TID) | ORAL | Status: DC
  Administered 2018-03-31: 75 mg via ORAL
  Filled 2018-03-31: qty 1

## 2018-03-31 MED ORDER — IPRATROPIUM-ALBUTEROL 0.5-2.5 (3) MG/3ML IN SOLN
3.0000 mL | Freq: Four times a day (QID) | RESPIRATORY_TRACT | Status: DC
  Administered 2018-03-31 (×2): 3 mL via RESPIRATORY_TRACT
  Filled 2018-03-31 (×2): qty 3

## 2018-03-31 MED ORDER — ARFORMOTEROL TARTRATE 15 MCG/2ML IN NEBU
15.0000 ug | INHALATION_SOLUTION | Freq: Two times a day (BID) | RESPIRATORY_TRACT | Status: DC
  Administered 2018-03-31: 15 ug via RESPIRATORY_TRACT
  Filled 2018-03-31: qty 2

## 2018-03-31 MED ORDER — ASPIRIN EC 81 MG PO TBEC
81.0000 mg | DELAYED_RELEASE_TABLET | Freq: Every day | ORAL | Status: DC
  Administered 2018-03-31: 81 mg via ORAL
  Filled 2018-03-31: qty 1

## 2018-03-31 MED ORDER — ADULT MULTIVITAMIN W/MINERALS CH: 1.0000 | ORAL_TABLET | Freq: Every evening | ORAL | Status: DC

## 2018-03-31 MED ORDER — BUDESONIDE 0.25 MG/2ML IN SUSP
0.2500 mg | Freq: Two times a day (BID) | RESPIRATORY_TRACT | Status: DC
  Administered 2018-03-31: 0.25 mg via RESPIRATORY_TRACT
  Filled 2018-03-31: qty 2

## 2018-03-31 MED ORDER — SODIUM CHLORIDE 0.9 % IV SOLN
INTRAVENOUS | Status: DC | PRN
  Administered 2018-03-31: 250 mL via INTRAVENOUS

## 2018-03-31 MED ORDER — ATORVASTATIN CALCIUM 40 MG PO TABS: 80.0000 mg | ORAL_TABLET | Freq: Every day | ORAL | Status: DC

## 2018-03-31 MED ORDER — LEVOTHYROXINE SODIUM 75 MCG PO TABS
75.0000 ug | ORAL_TABLET | Freq: Every day | ORAL | Status: DC
  Administered 2018-03-31: 75 ug via ORAL
  Filled 2018-03-31: qty 1

## 2018-03-31 MED ORDER — SODIUM CHLORIDE 0.9 % IV SOLN
1.0000 g | Freq: Two times a day (BID) | INTRAVENOUS | Status: DC
  Administered 2018-03-31 (×2): 1 g via INTRAVENOUS
  Filled 2018-03-31 (×2): qty 1

## 2018-03-31 NOTE — Telephone Encounter (Signed)
Mrs [name]  I did speak with the hospitalist today.  We will make a clinic visit as soon as possible to review medication and make sure she is doing well after the hospital visit.  Marshell Garfinkel MD  Pulmonary and Critical Care 03/31/2018, 2:29 PM

## 2018-03-31 NOTE — Progress Notes (Signed)
Discharge instructions given and explained to patient, Dr. Quincy Simmonds also reviewed discharge medication with patient she verbalized understanding,  denies any pain/distress. Accompanied home by daughter.

## 2018-03-31 NOTE — Progress Notes (Signed)
SATURATION QUALIFICATIONS: (This note is used to comply with regulatory documentation for home oxygen)  Patient Saturations on Room Air at Rest = 95%  Patient Saturations on Room Air while Ambulating = 90%  Patient Saturations on n/a Liters of oxygen while Ambulating = n/a%  Please briefly explain why patient needs home oxygen: Patient SOB with mobility, but maintaining SpO2 90% on RA.  No current home O2 needs.  Magda Kiel, [place] [phone] 03/31/2018

## 2018-03-31 NOTE — Telephone Encounter (Signed)
Copied from [place] [id number]. Topic: Quick Communication - See Telephone Encounter >> Mar 31, 2018  3:11 PM Vernona Rieger wrote: CRM for notification. See Telephone encounter for: 03/31/18. Patient's daughter, [place] [employer] would like her lab results form 9/18 with Dr Martinique. Please Advise.

## 2018-03-31 NOTE — Telephone Encounter (Signed)
Message sent to Dr. Jordan for review. 

## 2018-03-31 NOTE — Progress Notes (Signed)
patient refused to pay out of pocket for oxygen, oxygen saturation 90 and above. Dr. Quincy Simmonds notified

## 2018-03-31 NOTE — Telephone Encounter (Signed)
Please refer to messages on 9/28 on 03/28/18 in regard to lab and imaging results. Thanks BJ

## 2018-03-31 NOTE — ED Notes (Signed)
ED TO INPATIENT HANDOFF REPORT  Name/Age/Gender [name] 84 y.o. female  Code Status Code Status History    Date Active Date Inactive Code Status Order ID Comments User Context   03/02/2018 1659 03/04/2018 1500 Full Code [id number]  Karmen Bongo, MD Inpatient   01/12/2018 2210 01/14/2018 2135 Full Code [id number]  Ivor Costa, MD ED   10/09/2016 1958 10/12/2016 2135 Full Code [id number]  Doreatha Lew, MD ED   11/21/2015 2047 11/23/2015 2122 Full Code [id number]  Angelia Mould, MD Inpatient   10/04/2015 1658 10/05/2015 1811 Full Code [id number]  Arbutus Leas, NP Inpatient   06/22/2014 1209 06/24/2014 1859 Full Code [id number]  Caren Griffins, MD ED      Home/SNF/Other Home  Chief Complaint Respiratory Distress  Level of Care/Admitting Diagnosis ED Disposition    ED Disposition Condition [place]: [facility] [place] [[id number]]  Level of Care: Telemetry [5]  Admit to tele based on following criteria: Complex arrhythmia (Bradycardia/Tachycardia)  Diagnosis: COPD exacerbation ([place]) [[id number]]  Admitting Physician: Elwyn Reach [[id number]]  Attending Physician: Elwyn Reach [[id number]]  PT Class (Do Not Modify): Observation [104]  PT Acc Code (Do Not Modify): Observation [10022]       Medical History Past Medical History:  Diagnosis Date  . Anxiety   . Aortic arch atherosclerosis ([place]) 06/24/2014  . Atherosclerotic ulcer of aorta ([place]) 06/24/2014  . Carotid artery occlusion   . [place] DISEASE, LUMBAR 12/16/2008  . DIVERTICULOSIS, COLON 09/30/2008  . DYSPNEA 07/13/2008  . Graves disease   . History of embolic stroke 08/01/5807   Left brain  . HYPERLIPIDEMIA 03/06/2007  . HYPERTENSION 03/06/2007  . HYPOTHYROIDISM 10/13/2007  . OSTEOARTHRITIS 03/06/2007  . Personal history of colonic polyps 09/30/2008  . Stroke ([place])    06/2014           . TOBACCO USE, QUIT 04/12/2009  . WEAKNESS 11/09/2007    Allergies Allergies  Allergen Reactions  .  Catapres [Clonidine Hcl] Other (See Comments)    Made pt feel horrible, shaky, weak and nausea  . Lisinopril Anaphylaxis    Tongue swelling  . Labetalol Hcl Other (See Comments)    Caused bradycardia and syncope    IV Location/Drains/Wounds Patient Lines/Drains/Airways Status   Active Line/Drains/Airways    Name:   Placement date:   Placement time:   Site:   Days:   Peripheral IV 03/30/18 Left Antecubital   03/30/18    1857    Antecubital   1          Labs/Imaging Results for orders placed or performed during the hospital encounter of 03/30/18 (from the past 48 hour(s))  Basic metabolic panel     Status: Abnormal   Collection Time: 03/30/18  7:42 PM  Result Value Ref Range   Sodium 138 135 - 145 mmol/L   Potassium 3.4 (L) 3.5 - 5.1 mmol/L   Chloride 102 98 - 111 mmol/L   CO2 23 22 - 32 mmol/L   Glucose, Bld 132 (H) 70 - 99 mg/dL   BUN 17 8 - 23 mg/dL   Creatinine, Ser 1.19 (H) 0.44 - 1.00 mg/dL   Calcium 9.1 8.9 - 10.3 mg/dL   GFR calc non Af Amer 39 (L) >60 mL/min   GFR calc Af Amer 45 (L) >60 mL/min    Comment: (NOTE) The eGFR has been calculated using the CKD EPI equation. This calculation has not been validated in all clinical situations.  eGFR's persistently <60 mL/min signify possible Chronic Kidney Disease.    Anion gap 13 5 - 15    Comment: Performed at [facility], [place] [address] [place], [place] [address]  CBC with Differential/Platelet     Status: None   Collection Time: 03/30/18  7:42 PM  Result Value Ref Range   WBC 4.5 4.0 - 10.5 K/uL   RBC 4.04 3.87 - 5.11 MIL/uL   Hemoglobin 12.4 12.0 - 15.0 g/dL   HCT 37.8 36.0 - 46.0 %   MCV 93.6 78.0 - 100.0 fL   MCH 30.7 26.0 - 34.0 pg   MCHC 32.8 30.0 - 36.0 g/dL   RDW 13.5 11.5 - 15.5 %   Platelets 244 150 - 400 K/uL   Neutrophils Relative % 59 %   Neutro Abs 2.7 1.7 - 7.7 K/uL   Lymphocytes Relative 29 %   Lymphs Abs 1.3 0.7 - 4.0 K/uL   Monocytes Relative 7 %   Monocytes Absolute  0.3 0.1 - 1.0 K/uL   Eosinophils Relative 4 %   Eosinophils Absolute 0.2 0.0 - 0.7 K/uL   Basophils Relative 1 %   Basophils Absolute 0.0 0.0 - 0.1 K/uL    Comment: Performed at [facility], [place] [address] [place], [place] [address]  I-stat troponin, ED     Status: None   Collection Time: 03/30/18  7:49 PM  Result Value Ref Range   Troponin i, poc 0.00 0.00 - 0.08 ng/mL   Comment 3            Comment: Due to the release kinetics of cTnI, a negative result within the first hours of the onset of symptoms does not rule out myocardial infarction with certainty. If myocardial infarction is still suspected, repeat the test at appropriate intervals.   Blood gas, venous     Status: Abnormal   Collection Time: 03/30/18  7:59 PM  Result Value Ref Range   FIO2 21.00    pH, Ven 7.389 7.250 - 7.430   pCO2, Ven 41.7 (L) 44.0 - 60.0 mmHg   pO2, Ven 39.7 32.0 - 45.0 mmHg   Bicarbonate 24.6 20.0 - 28.0 mmol/L   Acid-Base Excess 0.1 0.0 - 2.0 mmol/L   O2 Saturation 68.5 %   Patient temperature 98.6    Collection site VEIN    Drawn by COLLECTED BY NURSE    Sample type VENOUS     Comment: Performed at [facility] [address], [place], [place] [address]   Dg Chest 2 View  Result Date: 03/30/2018 CLINICAL DATA:  COPD flare.  Increasing shortness of breath. EXAM: CHEST - 2 VIEW COMPARISON:  03/25/2018 FINDINGS: Stable hyperinflation and emphysematous changes. Chronic densities in the right lower chest. Stable nodular density in the mid left chest which is unchanged since 12/11/2015. No new airspace disease or lung consolidation. Heart and mediastinum are within normal limits and stable. Atherosclerotic calcifications at the aortic arch. No large pleural effusions. IMPRESSION: No active cardiopulmonary disease. Stable emphysematous changes. Electronically Signed   By: Markus Daft M.D.   On: 03/30/2018 20:15    Pending Labs [employer] (From admission,  onward)    Start     Ordered   Signed and Held  HIV antibody  Once,   R     Signed and Held   Signed and Held  CBC  Tomorrow morning,   R     Signed and [facility] and  Held  Basic metabolic panel  Tomorrow morning,   R     Signed and Held          Vitals/Pain Today's Vitals   03/30/18 1856 03/30/18 2236 03/31/18 0031 03/31/18 0037  BP:  (!) 133/56  (!) 123/54  Pulse:  92 83 82  Resp:  16  20  Temp:  97.7 F (36.5 C)    TempSrc:  Oral    SpO2:  98%  98%  PainSc: 0-No pain 0-No pain      Isolation Precautions No active isolations  Medications Medications  albuterol (PROVENTIL,VENTOLIN) solution continuous neb (10 mg/hr Nebulization Given 03/30/18 2024)  ipratropium (ATROVENT) nebulizer solution 0.5 mg (0.5 mg Nebulization Given 03/30/18 2024)  magnesium sulfate IVPB 2 g 50 mL (0 g Intravenous Stopped 03/30/18 2237)    Mobility walks with device

## 2018-03-31 NOTE — Evaluation (Signed)
Physical Therapy Evaluation Patient Details Name: [name] MRN: [id number] DOB: [birth year] Today's Date: 03/31/2018   History of Present Illness  84 y.o. female with medical history significant of COPD, CAD, anxiety disorder.  GERD, carotid artery disease, HTN who has had multiple hospitalizations this year with COPD exacerbation.   Clinical Impression  Patient presents with some general weakness from bedrest and acute illness, but mostly close to her baseline.  States received home health PT until last week and has been continuing exercises daily.  Feel no current need for follow up PT and she has walker at home if needed.  Did review fall prevention information she reported she was very familiar with.  No further acute skilled PT needs.  Will sign off.    Follow Up Recommendations No PT follow up    Equipment Recommendations  None recommended by PT    Recommendations for Other Services       Precautions / Restrictions Precautions Precautions: Fall      Mobility  Bed Mobility Overal bed mobility: Modified Independent                Transfers Overall transfer level: Modified independent                  Ambulation/Gait Ambulation/Gait assistance: Supervision;Min guard Gait Distance (Feet): 250 Feet Assistive device: None Gait Pattern/deviations: Step-through pattern;Decreased stride length     General Gait Details: general imbalance/weakness noted, but no overt LOB or acutaly physical assist needed, just for safety  Stairs Stairs: Yes Stairs assistance: Supervision Stair Management: One rail Left;Alternating pattern;Step to pattern Number of Stairs: 4    Wheelchair Mobility    Modified Rankin (Stroke Patients Only)       Balance Overall balance assessment: Needs assistance   Sitting balance-Leahy Scale: Good       Standing balance-Leahy Scale: Fair                               Pertinent Vitals/Pain Pain Assessment:  0-10 Pain Score: 0-No pain    Home Living Family/patient expects to be discharged to:: Private residence Living Arrangements: Alone   Type of Home: House Home Access: Stairs to enter Entrance Stairs-Rails: Left Entrance Stairs-Number of Steps: 2 Home Layout: Multi-level Home Equipment: Grab bars - toilet;Hand held shower head;Grab bars - tub/shower;Shower seat;Walker - 2 wheels      Prior Function Level of Independence: Independent         Comments: has housekeeper every two weeks to help clean, cooks, drives, gets groceries     Hand Dominance   Dominant Hand: Right    Extremity/Trunk Assessment   Upper Extremity Assessment Upper Extremity Assessment: Overall WFL for tasks assessed    Lower Extremity Assessment Lower Extremity Assessment: Overall WFL for tasks assessed       Communication   Communication: No difficulties  Cognition Arousal/Alertness: Awake/alert Behavior During Therapy: WFL for tasks assessed/performed Overall Cognitive Status: Within Functional Limits for tasks assessed                                        General Comments      Exercises     Assessment/Plan    PT Assessment Patent does not need any further PT services  PT Problem List         PT  Treatment Interventions      PT Goals (Current goals can be found in the Care Plan section)  Acute Rehab PT Goals PT Goal Formulation: All assessment and education complete, DC therapy    Frequency     Barriers to discharge        Co-evaluation               AM-PAC PT "6 Clicks" Daily Activity  Outcome Measure Difficulty turning over in bed (including adjusting bedclothes, sheets and blankets)?: None Difficulty moving from lying on back to sitting on the side of the bed? : None Difficulty sitting down on and standing up from a chair with arms (e.g., wheelchair, bedside commode, etc,.)?: A Little Help needed moving to and from a bed to chair (including a  wheelchair)?: A Little Help needed walking in hospital room?: A Little Help needed climbing 3-5 steps with a railing? : A Little 6 Click Score: 20    End of Session Equipment Utilized During Treatment: Gait belt Activity Tolerance: Patient tolerated treatment well Patient left: in bed;with call bell/phone within reach   PT Visit Diagnosis: Other abnormalities of gait and mobility (R26.89)    Time: 3736-6815 PT Time Calculation (min) (ACUTE ONLY): 22 min   Charges:   PT Evaluation $PT Eval Low Complexity: [place], [place] Acute [facility] Services [phone] 03/31/2018   Reginia Naas 03/31/2018, 1:49 PM

## 2018-03-31 NOTE — Progress Notes (Signed)
Initial Nutrition Assessment  DOCUMENTATION CODES:   Not applicable  INTERVENTION:  - Will order Ensure Enlive once/day, this supplement provides 350 kcal and 20 grams of protein. - Continue to encourage PO intakes.    NUTRITION DIAGNOSIS:   Increased nutrient needs related to chronic illness(COPD) as evidenced by estimated needs.  GOAL:   Patient will meet greater than or equal to 90% of their needs  MONITOR:   PO intake, Supplement acceptance, Weight trends, Labs  REASON FOR ASSESSMENT:   Consult Assessment of nutrition requirement/status  ASSESSMENT:   84 y.o. female with medical history significant of COPD, CAD, anxiety disorder.  GERD, carotid artery disease, HTN who has had multiple hospitalizations this year with COPD exacerbation. Patient started having significant SOB and cough the afternoon of admission. She is not on home O2. In the ED she was noted to be hypoxic and required O2 via [place]. She was admitted for COPD exacerbation.  BMI indicates normal weight. No intakes documented since admission. Patient reports that she ate 100% of breakfast (477 kcal, 24 grams of protein) without issue. She denies abdominal pain/pressure or nausea at this time. She denies any chewing or swallowing issues at baseline. She reports that she is scheduled to walk without O2 this afternoon and that she is hopeful to go home soon as she has plans to go to the beach on Saturday (9/28).  Patient was seen by another RD during admission 1 month ago. At that time patient had reported sometimes drinking supplements such as Ensure or Boost Breeze at home. She confirms that this is the case and that she drinks them fairly infrequently but is interested in having Ensure available while hospitalized.   Medications reviewed; 75 mcg oral Synthroid/day, 2 g IV Mg sulfate x1 run today, 40 mg Solu-medrol QID today followed by 40 mg Deltasone/day for 4 days, daily multivitamin with minerals. Labs reviewed;  creatinine: 1.4 mg/dL, GFR: 32 mL/min.        NUTRITION - FOCUSED PHYSICAL EXAM:    Most Recent Value  Orbital Region  No depletion  Upper Arm Region  Mild depletion  Thoracic and Lumbar Region  No depletion  Buccal Region  No depletion  Temple Region  No depletion  Clavicle Bone Region  Mild depletion  Clavicle and Acromion Bone Region  No depletion  Scapular Bone Region  No depletion  Dorsal Hand  No depletion  Patellar Region  No depletion  Anterior Thigh Region  No depletion  Posterior Calf Region  Mild depletion  Edema (RD Assessment)  None  Hair  Reviewed  Eyes  Reviewed  Mouth  Reviewed  Skin  Reviewed  Nails  Reviewed       Diet Order:   Diet Order            Diet Heart Room service appropriate? Yes; Fluid consistency: Thin  Diet effective now              EDUCATION NEEDS:   No education needs have been identified at this time  Skin:  Skin Assessment: Reviewed RN Assessment  Last BM:  9/23  Height:   Ht Readings from Last 1 Encounters:  03/31/18 5\' 6"  (1.676 m)    Weight:   Wt Readings from Last 1 Encounters:  03/31/18 60.2 kg    Ideal Body Weight:  59.09 kg  BMI:  Body mass index is 21.43 kg/m.  Estimated Nutritional Needs:   Kcal:  3474-2595 (23-28 kcal/kg)  Protein:  65-75 grams  Fluid:  >/= 1.5 L/day     Jarome Matin, MS, RD, LDN, [facility] Inpatient Clinical Dietitian Pager # [phone] After hours/weekend pager # [phone]

## 2018-03-31 NOTE — Care Management Note (Signed)
Case Management Note  Patient Details  Name: [name] MRN: [id number] Date of Birth: [birth year]  Subjective/Objective: Admitted wCOPD. From home. 02 sats does not qualify for home 02. No further CM needs.                   Action/Plan:dc home.   Expected Discharge Date:  03/31/18               Expected Discharge Plan:  Home/Self Care  In-House Referral:     Discharge planning Services  CM Consult  Post Acute Care Choice:    Choice offered to:     DME Arranged:    DME Agency:     HH Arranged:    HH Agency:     Status of Service:  Completed, signed off  If discussed at [employer] of Stay Meetings, dates discussed:    Additional Comments:  Dessa Phi, RN 03/31/2018, 2:25 PM

## 2018-03-31 NOTE — Discharge Summary (Signed)
Physician Discharge Summary  [name]  SJG:[id number]  DOB: 08/12/1924  DOA: 03/30/2018 PCP: [name], NP  Admit date: 03/30/2018 Discharge date: 03/31/2018  Admitted From: Home  Disposition: Home   Recommendations for Outpatient Follow-up:  1. Follow up with PCP in 1 week  2. Follow up with pulmonology in 1-2 weeks  3. Prednisone 40 mg x 5 days   Equipment/Devices: O2 2L [place]   Discharge Condition: Stable  CODE STATUS: Full Code  Diet recommendation: Heart Healthy   Brief/Interim Summary: For full details see H&P/Progress note, but in brief, [name] is a 84 year old female with medical history significant for severe COPD, CAD, anxiety, GERD and hypertension who presented to the emergency department complaining of shortness of breath.  Patient has been hospitalized multiple times during this year for COPD exacerbation.  Patient report development severe shortness of breath after decreasing her nebulizer treatment to only once a day.  Upon ED evaluation patient was noted to be hypoxic, she was treated with IV Solu-Medrol, nebulizers and magnesium with no significant improvement.  She was given 1 hour nebulizer treatment and provided some relief however needed to continue to be on oxygen.  Patient was admitted with working diagnosis of COPD exacerbation.  Subjective: Patient seen and examined, she reported being at baseline with her breathing.  Denies chest pain, wheezing and shortness of breath.  Discharge Diagnoses/Hospital Course:  Acute respiratory failure with hypoxia secondary to COPD exacerbation Patient with severe COPD, on maximum treatment and still having multiple exacerbations.  Patient requiring oxygen now.  Shortness of breath has improved from patient breathing well.  She will be discharged home oxygen and prednisone.  COPD exacerbation Unknown trigger however likely environmental allergies (rag weed).  Patient was treated with IV Solu-Medrol, 1 hour  albuterol treatment of multiple DuoNeb's, IV magnesium and oxygen supplementation.  Breathing status has been returned to baseline.  I have discussed case with her primary pulmonology who recommended prednisone for 5 days, continue Trelegy Elipta and DuoNeb every 6 hour as needed.  Will coordinate for O2 supplementation at home.  Patient needs maximum therapy, she will follow-up with her primary pulmonology doctor, some data showing aspirin associated bronchospasm, will discontinue aspirin for now and let pulmonologist or PCP to discuss later.  Hypertension BP stable during hospital stay  CKD stage III At baseline, follow-up with PCP  Hypothyroidism Stable, continue Synthroid  All other chronic medical condition were stable during the hospitalization.  Patient was seen by physical therapy, no PT follow-up recommended On the day of the discharge the patient's vitals were stable, and no other acute medical condition were reported by patient. the patient was felt safe to be discharge to home  Discharge Instructions  You were cared for by a hospitalist during your hospital stay. If you have any questions about your discharge medications or the care you received while you were in the hospital after you are discharged, you can call the unit and asked to speak with the hospitalist on call if the hospitalist that took care of you is not available. Once you are discharged, your primary care physician will handle any further medical issues. Please note that NO REFILLS for any discharge medications will be authorized once you are discharged, as it is imperative that you return to your primary care physician (or establish a relationship with a primary care physician if you do not have one) for your aftercare needs so that they can reassess your need for medications and monitor  your lab values.  Discharge Instructions    Call MD for:  difficulty breathing, headache or visual disturbances   Complete by:  As  directed    Call MD for:  extreme fatigue   Complete by:  As directed    Call MD for:  hives   Complete by:  As directed    Call MD for:  persistant dizziness or light-headedness   Complete by:  As directed    Call MD for:  persistant nausea and vomiting   Complete by:  As directed    Call MD for:  redness, tenderness, or signs of infection (pain, swelling, redness, odor or green/yellow discharge around incision site)   Complete by:  As directed    Call MD for:  severe uncontrolled pain   Complete by:  As directed    Call MD for:  temperature >100.4   Complete by:  As directed    Diet - low sodium heart healthy   Complete by:  As directed    Increase activity slowly   Complete by:  As directed      Allergies as of 03/31/2018      Reactions   Catapres [clonidine Hcl] Other (See Comments)   Made pt feel horrible, shaky, weak and nausea   Lisinopril Anaphylaxis   Tongue swelling   Labetalol Hcl Other (See Comments)   Caused bradycardia and syncope      Medication List    STOP taking these medications   aspirin EC 81 MG tablet     TAKE these medications   AEROCHAMBER MV inhaler Use as instructed   albuterol 108 (90 Base) MCG/ACT inhaler Commonly known as:  PROVENTIL HFA;VENTOLIN HFA Inhale 2 puffs into the lungs every 6 (six) hours as needed for wheezing or shortness of breath.   amLODipine 10 MG tablet Commonly known as:  NORVASC Take 1 tablet (10 mg total) by mouth daily.   atorvastatin 80 MG tablet Commonly known as:  LIPITOR TAKE 1 TABLET BY MOUTH EVERY DAY AT 6PM What changed:  See the new instructions.   [facility] DRY EYE RELIEF 0.2-0.2-1 % Soln Generic drug:  Glycerin-Hypromellose-PEG 400 Place 1 drop into both eyes as needed (dry eyes).   hydrALAZINE 25 MG tablet Commonly known as:  APRESOLINE TAKE 3 TABLETS BY MOUTH 3 TIMES A DAY What changed:  See the new instructions.   ipratropium-albuterol 0.5-2.5 (3) MG/3ML Soln Commonly known as:  DUONEB Take 3  mLs by nebulization every 6 (six) hours as needed.   levothyroxine 75 MCG tablet Commonly known as:  SYNTHROID, LEVOTHROID Take 1 tablet (75 mcg total) by mouth daily before breakfast.   multivitamin tablet Take 1 tablet by mouth every evening.   predniSONE 20 MG tablet Commonly known as:  DELTASONE Take 2 tablets (40 mg total) by mouth daily with breakfast for 5 days. Start taking on:  04/01/2018 What changed:    medication strength  how much to take  how to take this  when to take this  additional instructions   TRELEGY ELLIPTA 100-62.5-25 MCG/INH Aepb Generic drug:  Fluticasone-Umeclidin-Vilant Inhale 1 puff into the lungs daily.            Durable Medical Equipment  (From admission, onward)         Start     Ordered   03/31/18 1401  DME Oxygen  Once    Question Answer Comment  Mode or (Route) Nasal cannula   Liters per Minute 1   Frequency  Continuous (stationary and portable oxygen unit needed)   Oxygen delivery system Gas      03/31/18 1401         Follow-up Information    Nafziger, Tommi Rumps, NP. Schedule an appointment as soon as possible for a visit in 1 week(s).   Specialty:  Family Medicine Why:  Hospital follow up  Contact information: [address] [place] [address] [phone]          Allergies  Allergen Reactions  . Catapres [Clonidine Hcl] Other (See Comments)    Made pt feel horrible, shaky, weak and nausea  . Lisinopril Anaphylaxis    Tongue swelling  . Labetalol Hcl Other (See Comments)    Caused bradycardia and syncope    Consultations:  None    Procedures/Studies: Dg Chest 2 View  Result Date: 03/30/2018 CLINICAL DATA:  COPD flare.  Increasing shortness of breath. EXAM: CHEST - 2 VIEW COMPARISON:  03/25/2018 FINDINGS: Stable hyperinflation and emphysematous changes. Chronic densities in the right lower chest. Stable nodular density in the mid left chest which is unchanged since 12/11/2015. No new airspace  disease or lung consolidation. Heart and mediastinum are within normal limits and stable. Atherosclerotic calcifications at the aortic arch. No large pleural effusions. IMPRESSION: No active cardiopulmonary disease. Stable emphysematous changes. Electronically Signed   By: Markus Daft M.D.   On: 03/30/2018 20:15   Dg Chest 2 View  Result Date: 03/25/2018 CLINICAL DATA:  Shortness of breath, cough EXAM: CHEST - 2 VIEW COMPARISON:  03/02/2018 FINDINGS: Severe COPD changes. Heart is normal size. Scarring in the lung bases and apices. No acute confluent opacities or effusions. No acute bony abnormality. IMPRESSION: Severe COPD/chronic changes.  No active disease. Electronically Signed   By: Rolm Baptise M.D.   On: 03/25/2018 16:39   Dg Chest 2 View  Result Date: 03/02/2018 CLINICAL DATA:  Cough and shortness of Breath EXAM: CHEST - 2 VIEW COMPARISON:  01/12/2018 FINDINGS: Cardiac shadow is within normal limits. The lungs are hyperinflated consistent with COPD. Chronic blunting of left costophrenic angle noted. No focal infiltrate or sizable effusion is seen. Degenerative changes of the thoracic spine are noted. IMPRESSION: COPD without acute abnormality. Electronically Signed   By: Inez Catalina M.D.   On: 03/02/2018 10:14     Discharge Exam: Vitals:   03/31/18 0925 03/31/18 1333  BP: (!) 135/58   Pulse:    Resp:    Temp:    SpO2:  93%   Vitals:   03/31/18 0758 03/31/18 0810 03/31/18 0925 03/31/18 1333  BP:   (!) 135/58   Pulse:      Resp:      Temp:      TempSrc:      SpO2: 97% 97%  93%  Weight:      Height:        General: NAD  Cardiovascular: RRR, S1/S2 + Respiratory: Good air entry, no wheezing  Abdominal: Soft, NT, ND, bowel sounds + Extremities: no edema  The results of significant diagnostics from this hospitalization (including imaging, microbiology, ancillary and laboratory) are listed below for reference.     Microbiology: No results found for this or any previous visit  (from the past 240 hour(s)).   Labs: BNP (last 3 results) Recent Labs    11/12/17 1825 01/13/18 0021 03/02/18 0924  BNP 111.9* 105.0* 21.3   Basic Metabolic Panel: Recent Labs  Lab 03/25/18 1138 03/30/18 1942 03/31/18 0510  NA 138 138 136  K 3.8  3.4* 3.7  CL 102 102 102  CO2 28 23 23   GLUCOSE 118* 132* 235*  BUN 12 17 22   CREATININE 1.04 1.19* 1.40*  CALCIUM 8.8 9.1 9.1   Liver Function Tests: No results for input(s): AST, ALT, ALKPHOS, BILITOT, PROT, ALBUMIN in the last 168 hours. No results for input(s): LIPASE, AMYLASE in the last 168 hours. No results for input(s): AMMONIA in the last 168 hours. CBC: Recent Labs  Lab 03/25/18 1138 03/30/18 1942 03/31/18 0510  WBC 3.5* 4.5 3.2*  NEUTROABS 2.3 2.7  --   HGB 13.1 12.4 11.9*  HCT 39.0 37.8 35.9*  MCV 92.0 93.6 94.2  PLT 223.0 244 222   Cardiac Enzymes: No results for input(s): CKTOTAL, CKMB, CKMBINDEX, TROPONINI in the last 168 hours. BNP: Invalid input(s): POCBNP CBG: No results for input(s): GLUCAP in the last 168 hours. D-Dimer No results for input(s): DDIMER in the last 72 hours. Hgb A1c No results for input(s): HGBA1C in the last 72 hours. Lipid Profile No results for input(s): CHOL, HDL, LDLCALC, TRIG, CHOLHDL, LDLDIRECT in the last 72 hours. Thyroid function studies No results for input(s): TSH, T4TOTAL, T3FREE, THYROIDAB in the last 72 hours.  Invalid input(s): FREET3 Anemia work up No results for input(s): VITAMINB12, FOLATE, FERRITIN, TIBC, IRON, RETICCTPCT in the last 72 hours. Urinalysis    Component Value Date/Time   COLORURINE YELLOW 03/02/2018 1346   APPEARANCEUR CLEAR 03/02/2018 1346   LABSPEC 1.010 03/02/2018 1346   PHURINE 6.0 03/02/2018 1346   GLUCOSEU NEGATIVE 03/02/2018 1346   GLUCOSEU NEGATIVE 12/11/2015 1216   HGBUR NEGATIVE 03/02/2018 1346   BILIRUBINUR NEGATIVE 03/02/2018 1346   BILIRUBINUR n 08/17/2012 1455   KETONESUR NEGATIVE 03/02/2018 1346   PROTEINUR NEGATIVE  03/02/2018 1346   UROBILINOGEN 0.2 12/11/2015 1216   NITRITE NEGATIVE 03/02/2018 1346   LEUKOCYTESUR NEGATIVE 03/02/2018 1346   Sepsis Labs Invalid input(s): PROCALCITONIN,  WBC,  LACTICIDVEN Microbiology No results found for this or any previous visit (from the past 240 hour(s)).   Time coordinating discharge: 32 minutes  SIGNED:  Chipper Oman, MD  [facility] 03/31/2018, 2:01 PM  Pager please text page via  [email or web address]  Note - This record has been created using [employer]. Chart creation errors have been sought, but may not always have been located. Such creation errors do not reflect on the standard of medical care.

## 2018-04-01 ENCOUNTER — Telehealth: Payer: Self-pay | Admitting: Adult Health

## 2018-04-01 NOTE — Telephone Encounter (Signed)
Spoke with [name]. She is requesting a verbal order to continue the pt's skilled nursing visits. This verbal order has been given to her. Nothing further was needed at this time.

## 2018-04-01 NOTE — Telephone Encounter (Signed)
Spoke with daughter Keane Police and gave lab and x-ray results. [name] verbalized understanding. [name] stated that patient would have to reschedule hospital follow-up with Tommi Rumps due to another appointment scheduled on tomorrow. She also stated that the hospital told patient to stop breathing treatment and there were some other things that they were trying to change and they weren't happy with that they will discuss at [place] with PCP.

## 2018-04-01 NOTE — Progress Notes (Signed)
@Patient  ID: [name], female    DOB: 01-Nov-1924, 84 y.o.   MRN: [id number]  Chief Complaint  Patient presents with  . Hospitalization Follow-up    Hospitalization-COPD exacerbation, COPD follow-up, confusion with medications    Referring provider: [name], NP  HPI:  84 year old female former smoker followed in our office for COPD Gold I D (01/2018 - PFT Fev1 - 84), emphysema (01/2018 dlco - 49)  PMH: Hypertension, chronic kidney disease stage III, hypothyroidism, osteoarthritis, stroke, left bundle branch block, hyperlipidemia Smoker/ Smoking History: Former smoker.  43.5-pack-year history. Maintenance:  Trelegy Ellipta Pt of: Dr. Vaughan Browner  04/02/2018  - Visit   84 year old patient presenting today for hospital follow-up for COPD exacerbation.  This is her third COPD exacerbation requiring hospitalization in the last 3 months.  Patient was last seen at the beginning of this month by another 1 of our nurse practitioners posthospitalization.   Patient reports that since being discharged from the hospital on 03/31/2018 symptoms have been well controlled.  Patient is currently taking 40 mg of daily prednisone as prescribed from the hospital.  Patient is adherent to her Trelegy Ellipta.  Patient is not on any oxygen.  Patient thinks that this is the best she is felt in a few weeks.  She just has concerns that she may have another exacerbation.  Patient would like to know how she can prevent those.  Patient reports that hospitalist at her last hospitalization told her that she was overmedicated with her trilogy Ellipta as well as with duo nebs.  Patient also has a prednisone taper and she is unsure how to take this or if she should take it.  Patient is preparing to go on a beach vacation next week and would like to go.   MMRC - Breathlessness Score 2 - on level ground, I walk slower than people of the same age because of breathlessness, or have to stop for breathe when walking to  my own pace  Patient presents for office visit with daughter.   Tests:   CT scan chest 11/26/12- Right lingular opacity, scattered sub cm pulmonary nodules, images reviewed.  CT 04/05/16- Stable pulmonary nodules Severe COPD. Chest x-ray 01/12/2018- hyperinflation, no active pulmonary disease.  Stable 7 mm opacity in the left midlung. 03/30/2018-chest x-ray- stable hyperinflation with emphysematous changes, stable nodular density mid left chest which is unchanged on 12/11/2015, no active cardiopulmonary disease .  PFTs 04/02/16 FVC 2.54 (107%), FEV1 1.43 (82%), F/F 56, TLC 107%, DLCO 42% >>>Mod obstructive lung disease with DLCO impairment.  No bronchodilator response  01/13/2018-pulmonary function test- FVC 2.46 (180% predicted), postbronchodilator ratio 59, FEV1 84, mid flow reversibility after bronchodilator, DLCO 52 >>>Mild obstructive airway disease, and significant response of bronchodilator, moderately severe diffusion defect   DATA from [facility], [place] CT of neck 04/09/16- no CT findings to suggest an acute soft tissue abnormality,  subtle asymmetric prominence of right sub-lingual tonsil. Asymmetric prominence of left vocal cord Multiple thyroid lesions Relative ectasia of left carotid bulb Moderate to advanced degenerative arthritis and disc disease  CT of the brain 04/09/16- no CT findings for acute intracranial abnormality. Minimal changes of aging.  Chest x-ray 04/09/16- no acute cardiac pulmonary disease. Emphysema.  EKG 04/09/16- sinus tachycardia, incomplete left bundle branch block, LVH, anterior Q waves  03/31/2018-procalcitonin-normal below 0.10 03/31/2018- bmet-creatinine 1.4, GFR 32 03/31/2018- CBC-hemoglobin 11.9   Chart Review:  03/30/2018-hospitalization-COPD exacerbation >>>Discharge date 03/31/2018 >>>Discharged on prednisone 40 mg x 5 days >>>Follow-up  with PCP and pulmonology in 1 week   Specialty Problems      Pulmonary Problems    DYSPNEA    Qualifier: Diagnosis of  By: Burnice Logan  MD, Doretha Sou       Solitary pulmonary nodule    IMPRESSION: No active cardiopulmonary disease.  7 mm left mid lung pulmonary nodule which may reflect an area of scarring versus a true pulmonary nodule.   Electronically Signed  By: Kathreen Devoid  On: 10/23/2015 10:29      Other emphysema ([facility])   COPD GOLD I D    PFTs 2019- FVC 2.72 (119%), FEV1 1.61 (96%), ratio 50      SOB (shortness of breath)   COPD with acute exacerbation (HCC)   COPD exacerbation (HCC)      Allergies  Allergen Reactions  . Catapres [Clonidine Hcl] Other (See Comments)    Made pt feel horrible, shaky, weak and nausea  . Lisinopril Anaphylaxis    Tongue swelling  . Labetalol Hcl Other (See Comments)    Caused bradycardia and syncope    Immunization History  Administered Date(s) Administered  . Influenza Split 05/01/2011, 04/29/2012  . Influenza Whole 04/14/2007, 04/12/2008, 04/12/2009, 04/25/2010  . Influenza, High Dose Seasonal PF 04/12/2014, 04/25/2015, 03/13/2018  . Influenza,inj,Quad PF,6+ Mos 03/09/2013  . Influenza-Unspecified 03/22/2016, 04/15/2017  . Pneumococcal Conjugate-13 06/09/2013  . Pneumococcal Polysaccharide-23 04/07/1993, 05/08/2006  . Td 12/12/2008    Past Medical History:  Diagnosis Date  . Anxiety   . Aortic arch atherosclerosis ([place]) 06/24/2014  . Atherosclerotic ulcer of aorta ([place]) 06/24/2014  . Carotid artery occlusion   . [place] DISEASE, LUMBAR 12/16/2008  . DIVERTICULOSIS, COLON 09/30/2008  . DYSPNEA 07/13/2008  . Graves disease   . History of embolic stroke 1/61/0960   Left brain  . HYPERLIPIDEMIA 03/06/2007  . HYPERTENSION 03/06/2007  . HYPOTHYROIDISM 10/13/2007  . OSTEOARTHRITIS 03/06/2007  . Personal history of colonic polyps 09/30/2008  . Stroke ([place])    06/2014           . TOBACCO USE, QUIT 04/12/2009  . WEAKNESS 11/09/2007    Tobacco History: Social History   Tobacco Use  Smoking Status Former  Smoker  . Packs/day: 1.50  . Years: 29.00  . Pack years: 43.50  . Types: Cigarettes  . Start date: 07/08/1949  . Last attempt to quit: 07/08/1978  . Years since quitting: 39.7  Smokeless Tobacco Never Used   Counseling given: Yes   Continue not smoking.  Outpatient Encounter Medications as of 04/02/2018  Medication Sig  . albuterol (PROAIR HFA) 108 (90 Base) MCG/ACT inhaler Inhale 2 puffs into the lungs every 6 (six) hours as needed for wheezing or shortness of breath.  [name] Kitchen amLODipine (NORVASC) 10 MG tablet Take 1 tablet (10 mg total) by mouth daily.  [name] Kitchen atorvastatin (LIPITOR) 80 MG tablet TAKE 1 TABLET BY MOUTH EVERY DAY AT 6PM (Patient taking differently: Take 80 mg by mouth daily at 6 PM. )  . Fluticasone-Umeclidin-Vilant (TRELEGY ELLIPTA) 100-62.5-25 MCG/INH AEPB Inhale 1 puff into the lungs daily.   . Glycerin-Hypromellose-PEG 400 ([facility] DRY EYE RELIEF) 0.2-0.2-1 % SOLN Place 1 drop into both eyes as needed (dry eyes).   . hydrALAZINE (APRESOLINE) 25 MG tablet TAKE 3 TABLETS BY MOUTH 3 TIMES A DAY (Patient taking differently: Take 75 mg by mouth 3 (three) times daily. )  . levothyroxine (SYNTHROID, LEVOTHROID) 75 MCG tablet Take 1 tablet (75 mcg total) by mouth daily before breakfast.  . Multiple Vitamin (MULTIVITAMIN) tablet  Take 1 tablet by mouth every evening.  . predniSONE (DELTASONE) 20 MG tablet Take 2 tablets (40 mg total) by mouth daily with breakfast for 5 days.  [name] Kitchen Spacer/Aero-Holding Chambers (AEROCHAMBER MV) inhaler Use as instructed  . albuterol (PROVENTIL) (2.5 MG/3ML) 0.083% nebulizer solution Take 3 mLs (2.5 mg total) by nebulization every 6 (six) hours as needed for wheezing or shortness of breath.  . Fluticasone-Umeclidin-Vilant (TRELEGY ELLIPTA) 100-62.5-25 MCG/INH AEPB Inhale 1 puff into the lungs daily.  [name] Kitchen ipratropium-albuterol (DUONEB) 0.5-2.5 (3) MG/3ML SOLN Take 3 mLs by nebulization every 6 (six) hours as needed. (Patient not taking: Reported on 04/02/2018)  .  predniSONE (DELTASONE) 10 MG tablet Take 1 tablet (10 mg total) by mouth daily with breakfast.   No facility-administered encounter medications on file as of 04/02/2018.      Review of Systems  Review of Systems  Constitutional: Negative for chills, fatigue, fever and unexpected weight change.  HENT: Negative for congestion, ear pain, postnasal drip, sinus pressure and sinus pain.   Respiratory: Positive for shortness of breath. Negative for cough, chest tightness and wheezing.        Denies indigestion   Cardiovascular: Negative for chest pain and palpitations.  Gastrointestinal: Negative for blood in stool, diarrhea, nausea and vomiting.  Genitourinary: Negative for dysuria, frequency and urgency.  Musculoskeletal: Negative for arthralgias.  Skin: Negative for color change.  Allergic/Immunologic: Negative for environmental allergies and food allergies.  Neurological: Negative for dizziness, light-headedness and headaches.  Psychiatric/Behavioral: Negative for dysphoric mood. The patient is nervous/anxious and is hyperactive.   All other systems reviewed and are negative.    Physical Exam  BP (!) 148/58 (BP Location: Left Arm, Cuff Size: Normal)   Pulse 85   Ht 5' 4.25" (1.632 m)   Wt 134 lb 9.6 oz (61.1 kg)   SpO2 91%   BMI 22.92 kg/m   Wt Readings from Last 5 Encounters:  04/02/18 134 lb 9.6 oz (61.1 kg)  03/31/18 132 lb 12.8 oz (60.2 kg)  03/25/18 132 lb 4 oz (60 kg)  03/13/18 133 lb 9.6 oz (60.6 kg)  03/02/18 135 lb 1.9 oz (61.3 kg)     Physical Exam  Constitutional: She is oriented to person, place, and time and well-developed, well-nourished, and in no distress. No distress.  HENT:  Head: Normocephalic and atraumatic.  Right Ear: Hearing, tympanic membrane, external ear and ear canal normal.  Left Ear: Hearing, tympanic membrane, external ear and ear canal normal.  Nose: Nose normal. Right sinus exhibits no maxillary sinus tenderness and no frontal sinus  tenderness. Left sinus exhibits no maxillary sinus tenderness and no frontal sinus tenderness.  Mouth/Throat: Uvula is midline and oropharynx is clear and moist. No oropharyngeal exudate.  Eyes: Pupils are equal, round, and reactive to light.  Neck: Normal range of motion. Neck supple. No JVD present.  Cardiovascular: Normal rate, regular rhythm and normal heart sounds.  Pulmonary/Chest: Effort normal. No accessory muscle usage. No respiratory distress. She has no decreased breath sounds. She has wheezes (Slight expiratory wheeze). She has no rhonchi.  Abdominal: Soft. Bowel sounds are normal. There is no tenderness.  Musculoskeletal: Normal range of motion. She exhibits no edema.  Lymphadenopathy:    She has no cervical adenopathy.  Neurological: She is alert and oriented to person, place, and time. Gait normal.  Skin: Skin is warm and dry. She is not diaphoretic. No erythema.  Psychiatric: Memory, affect and judgment normal. Her mood appears anxious.  Patient very anxious over  how to take her medications and whether or not she is taking them correctly.  We had reviewed her medications as well as her pulmonary treatment plan multiple times at office visit today.  Nursing note and vitals reviewed.     Lab Results:  CBC    Component Value Date/Time   WBC 3.2 (L) 03/31/2018 0510   RBC 3.81 (L) 03/31/2018 0510   HGB 11.9 (L) 03/31/2018 0510   HGB 13.3 08/20/2017 1430   HCT 35.9 (L) 03/31/2018 0510   HCT 38.9 08/20/2017 1430   PLT 222 03/31/2018 0510   PLT 213 08/20/2017 1430   MCV 94.2 03/31/2018 0510   MCV 92 08/20/2017 1430   MCH 31.2 03/31/2018 0510   MCHC 33.1 03/31/2018 0510   RDW 13.5 03/31/2018 0510   RDW 14.1 08/20/2017 1430   LYMPHSABS 1.3 03/30/2018 1942   MONOABS 0.3 03/30/2018 1942   EOSABS 0.2 03/30/2018 1942   BASOSABS 0.0 03/30/2018 1942    BMET    Component Value Date/Time   NA 136 03/31/2018 0510   NA 140 08/20/2017 1430   K 3.7 03/31/2018 0510   CL 102  03/31/2018 0510   CO2 23 03/31/2018 0510   GLUCOSE 235 (H) 03/31/2018 0510   BUN 22 03/31/2018 0510   BUN 18 08/20/2017 1430   CREATININE 1.40 (H) 03/31/2018 0510   CALCIUM 9.1 03/31/2018 0510   GFRNONAA 32 (L) 03/31/2018 0510   GFRAA 37 (L) 03/31/2018 0510    BNP    Component Value Date/Time   BNP 81.9 03/02/2018 0924    ProBNP    Component Value Date/Time   PROBNP 46.0 12/11/2015 1216    Imaging: Dg Chest 2 View  Result Date: 03/30/2018 CLINICAL DATA:  COPD flare.  Increasing shortness of breath. EXAM: CHEST - 2 VIEW COMPARISON:  03/25/2018 FINDINGS: Stable hyperinflation and emphysematous changes. Chronic densities in the right lower chest. Stable nodular density in the mid left chest which is unchanged since 12/11/2015. No new airspace disease or lung consolidation. Heart and mediastinum are within normal limits and stable. Atherosclerotic calcifications at the aortic arch. No large pleural effusions. IMPRESSION: No active cardiopulmonary disease. Stable emphysematous changes. Electronically Signed   By: Markus Daft M.D.   On: 03/30/2018 20:15   Dg Chest 2 View  Result Date: 03/25/2018 CLINICAL DATA:  Shortness of breath, cough EXAM: CHEST - 2 VIEW COMPARISON:  03/02/2018 FINDINGS: Severe COPD changes. Heart is normal size. Scarring in the lung bases and apices. No acute confluent opacities or effusions. No acute bony abnormality. IMPRESSION: Severe COPD/chronic changes.  No active disease. Electronically Signed   By: Rolm Baptise M.D.   On: 03/25/2018 16:39      Assessment & Plan:   Pleasant 84 year old patient presenting today for hospital follow-up.  Reviewed her COPD treatment plan.  Doubt based off of her explanation of how her symptoms develop that this was due to her duo nebs, just in case we will replace which is plain albuterol will place a prescription for the nebs today.  We will continue Trelegy Ellipta as patient has frequent exacerbations.  I will have patient  have a extended prednisone taper due to her frequency of exacerbations and upcoming vacation trip we have discussed the pros and cons of extended prednisone use.  With the patient and the daughter agree that dose prednisone daily is reasonable until we see her back in the proceed forward with that.   I will see the patient back in 2  weeks after completion of her vacation.  We can readdress her prednisone dosing at that period of time.  I have explained to the patient and her daughter multiple times that if they have concerns regarding medications, breathing, or her symptoms to contact our office.  COPD GOLD I D Continue Trelegy Ellipta  >>> 1 puff daily in the morning >>>rinse mouth out after use  >>> This inhaler contains 3 medications that help manage her respiratory status, contact our office if you cannot afford this medication or unable to remain on this medication  Prednisone: We will have patient be on extended prednisone taper  >>>first her 40 mg daily from the hospital until she runs out  >>> Then she will complete the prednisone taper provided at last office visit  >>> Then she will maintain 10 mg daily until I see her back in office >>>take with food in the morning with food  Can use rescue inhaler albuterol (what you call your puffer) every 4-6 hours as needed for shortness of breath and wheezing >>>Take this with you wherever you go  We will send a prescription for albuterol nebulizers >>>You will use this in place of your DuoNeb (ipratropium-albuterol).  >>> You can use as needed every 4-6 hours for shortness of breath wheezing  Follow-up in 2 weeks  Note your daily symptoms > remember "red flags" for COPD:   >>>Increase in cough >>>increase in sputum production >>>increase in shortness of breath or activity  intolerance.   If you notice these symptoms, please call the office to be seen.    COPD with acute exacerbation ([place]) Exacerbation resolving on exam  today  Medication management Reviewed medications with patient today.  Continue Trelegy Ellipta daily  We will have patient be on extended prednisone taper  >>>first her 40 mg daily from the hospital until she runs out  >>> Then she will complete the prednisone taper provided at last office visit  >>> Then she will maintain 10 mg daily until I see her back in office  Can use albuterol nebulizers every 6 hours to help with shortness of breath or wheezing  Can also use albuterol rescue inhaler every 4-6 hours as needed for shortness of breath or wheezing  Expressed multiple times with the patient as well as her daughter to contact our office if they are having difficulties following this regimen or if they have any questions or concerns.  This appointment was 45 minutes along with her 50% of that time direct face-to-face patient care, assessment, medication follow-up, med review, and discussion with plan of care.   Lauraine Rinne, NP 04/02/2018

## 2018-04-01 NOTE — Telephone Encounter (Signed)
Transition Care Management Follow-up Telephone Call   Date discharged? 03/31/18   How have you been since you were released from the hospital? "I'm feeling great"   Do you understand why you were in the hospital? Yes    Do you understand the discharge instructions? No   Where were you discharged to? Home    Items Reviewed:  Medications reviewed:  She was very rushed - said she didn't have any questions about her medications and is taking them as prescribed.  Allergies reviewed: No  Dietary changes reviewed: No Referrals reviewed: No  Functional Questionnaire:  Activities of Daily Living (ADLs):   She states they are independent in the following: ambulation, bathing and hygiene, feeding, continence, grooming, toileting and dressing States they require assistance with the following: n/a   Any transportation issues/concerns?: no   Any patient concerns? no  Confirmed importance and date/time of follow-up visits scheduled:  Pt states she has a follow up with pulmonary on 9/26 - declined a follow up with PCP.   Confirmed with patient if condition begins to worsen call PCP or go to the ER.  Patient was given the office number and encouraged to call back with question or concerns.  :  Yes

## 2018-04-02 ENCOUNTER — Ambulatory Visit: Payer: Medicare Other | Admitting: Pulmonary Disease

## 2018-04-02 ENCOUNTER — Encounter: Payer: Self-pay | Admitting: Pulmonary Disease

## 2018-04-02 ENCOUNTER — Ambulatory Visit: Payer: Medicare Other | Admitting: Adult Health

## 2018-04-02 DIAGNOSIS — Z79899 Other long term (current) drug therapy: Secondary | ICD-10-CM

## 2018-04-02 DIAGNOSIS — J42 Unspecified chronic bronchitis: Secondary | ICD-10-CM

## 2018-04-02 DIAGNOSIS — J441 Chronic obstructive pulmonary disease with (acute) exacerbation: Secondary | ICD-10-CM

## 2018-04-02 MED ORDER — ALBUTEROL SULFATE (2.5 MG/3ML) 0.083% IN NEBU: 2.5000 mg | INHALATION_SOLUTION | Freq: Four times a day (QID) | RESPIRATORY_TRACT | 6 refills | Status: DC | PRN

## 2018-04-02 MED ORDER — PREDNISONE 10 MG PO TABS: 10.0000 mg | ORAL_TABLET | Freq: Every day | ORAL | 0 refills | Status: DC

## 2018-04-02 MED ORDER — FLUTICASONE-UMECLIDIN-VILANT 100-62.5-25 MCG/INH IN AEPB: 1.0000 | INHALATION_SPRAY | Freq: Every day | RESPIRATORY_TRACT | 0 refills | Status: DC

## 2018-04-02 NOTE — Assessment & Plan Note (Addendum)
Continue Trelegy Ellipta  >>> 1 puff daily in the morning >>>rinse mouth out after use  >>> This inhaler contains 3 medications that help manage her respiratory status, contact our office if you cannot afford this medication or unable to remain on this medication  Prednisone: We will have patient be on extended prednisone taper  >>>first her 40 mg daily from the hospital until she runs out  >>> Then she will complete the prednisone taper provided at last office visit >>> 4 tabs for 2 days, then 3 tabs for 2 days, 2 tabs for 2 days, then 1 tab 10 mg daily until I see her back in office >>>take with food in the morning with food  Can use rescue inhaler albuterol (what you call your puffer) every 4-6 hours as needed for shortness of breath and wheezing >>>Take this with you wherever you go  We will send a prescription for albuterol nebulizers >>>You will use this in place of your DuoNeb (ipratropium-albuterol).  >>> You can use as needed every 4-6 hours for shortness of breath wheezing  Follow-up in 2 weeks  Note your daily symptoms > remember "red flags" for COPD:   >>>Increase in cough >>>increase in sputum production >>>increase in shortness of breath or activity  intolerance.   If you notice these symptoms, please call the office to be seen.

## 2018-04-02 NOTE — Assessment & Plan Note (Signed)
Exacerbation resolving on exam today

## 2018-04-02 NOTE — Patient Instructions (Addendum)
Continue Trelegy Ellipta  >>> 1 puff daily in the morning >>>rinse mouth out after use  >>> This inhaler contains 3 medications that help manage her respiratory status, contact our office if you cannot afford this medication or unable to remain on this medication  Finish prednisone from hospital, then start prednisone from last office visit this is a taper, then continue one 10 mg tablet daily until return to our office >>>take with food in the morning   Can use rescue inhaler albuterol (what you call your puffer) every 4-6 hours as needed for shortness of breath and wheezing >>>Take this with you wherever you go  We will send a prescription for albuterol nebulizers >>>You will use this in place of your DuoNeb (ipratropium-albuterol).  >>> You can use as needed every 4-6 hours for shortness of breath wheezing  Follow-up in 2 weeks  Note your daily symptoms > remember "red flags" for COPD:   >>>Increase in cough >>>increase in sputum production >>>increase in shortness of breath or activity  intolerance.   If you notice these symptoms, please call the office to be seen.    Enjoy herself at the beach!  Have fun playing bridge!  See you back in 2 weeks!  Contact us if you have any concerns or questions   Congratulations on already receiving a flu vaccine!       It is flu season:   >>>Remember to be washing your hands regularly, using hand sanitizer, be careful to use around herself with has contact with people who are sick will increase her chances of getting sick yourself. >>> Best ways to protect herself from the flu: Receive the yearly flu vaccine, practice good hand hygiene washing with soap and also using hand sanitizer when available, eat a nutritious meals, get adequate rest, hydrate appropriately    As of 05/11/2018 we will be moving! We will no longer be at our [place] location.   Our new address and phone number will be:  [address] [place], [place]  [address] Telephone number: [phone]   Please contact the office if your symptoms worsen or you have concerns that you are not improving.   Thank you for choosing [facility] for your healthcare, and for allowing [place] to partner with you on your healthcare journey. I am thankful to be able to provide care to you today.   [name] FNP-C

## 2018-04-02 NOTE — Assessment & Plan Note (Signed)
Reviewed medications with patient today.  Continue Trelegy Ellipta daily  We will have patient be on extended prednisone taper  >>>first her 40 mg daily from the hospital until she runs out  >>> Then she will complete the prednisone taper provided at last office visit  >>> Then she will maintain 10 mg daily until I see her back in office  Can use albuterol nebulizers every 6 hours to help with shortness of breath or wheezing  Can also use albuterol rescue inhaler every 4-6 hours as needed for shortness of breath or wheezing  Expressed multiple times with the patient as well as her daughter to contact our office if they are having difficulties following this regimen or if they have any questions or concerns.

## 2018-04-14 ENCOUNTER — Telehealth: Payer: Self-pay | Admitting: Pulmonary Disease

## 2018-04-14 NOTE — Telephone Encounter (Signed)
Unlikely this is from prednisone.  Patient could increase potassium in foods.  Make sure she is well-hydrated.  Has she had any other new medications added such as fluid pills or blood pressure medications.  Patient could present to primary care for blood work for electrolyte abnormalities.  Her lab work 2 weeks ago was stable.  Is the patient having any other symptoms besides the leg cramping?  [name] FNP

## 2018-04-14 NOTE — Telephone Encounter (Signed)
Called and spoke with patient she is aware of Brian's response and verbalized understanding. No other symptoms. Patient will call PCP.

## 2018-04-14 NOTE — Telephone Encounter (Signed)
Called and spoke to patient, she states she has been on prednisone since she left the hospital she started having severe leg cramps making her unable to walk during the night. Patient would like to know if the pro-long prednisone could be causing her leg cramping and if she should keep taking it.   Currently taking Prednisone 10mg  daily.   BM please advise?

## 2018-04-20 NOTE — Progress Notes (Signed)
@Patient  ID: [name], female    DOB: [birth year], 84 y.o.   MRN: [id number]  Chief Complaint  Patient presents with  . Follow-up    COPD follow up     Referring provider: [name], NP  HPI:  84 year old female former smoker followed in our office for COPD Gold I D (01/2018 - PFT Fev1 - 84), emphysema (01/2018 dlco - 1)  PMH: Hypertension, chronic kidney disease stage III, hypothyroidism, osteoarthritis, stroke, left bundle branch block, hyperlipidemia Smoker/ Smoking History: Former smoker.  43.5-pack-year history. Maintenance:  Trelegy Ellipta Pt of: Dr. Vaughan Browner  04/21/2018  - Visit   84 year old female patient presenting today for 2-week follow-up visit.  Patient was last seen on 94/26/19.  Patient was treated for COPD exacerbation patient had 2 recent hospitalizations due to COPD exacerbations over the last 3 months.  Patient was left on extended prednisone taper, which patient stopped about 7 to 8 days ago.  Patient reports she is doing much better since last office visit.  Patient was able to complete vacation at the beach.  Patient reports that she finished her prednisone taper and did not remain on prednisone she has been off of prednisone for the last 7 to 8 days.  Patient continues to take Trelegy Ellipta daily as prescribed.  Patient reports she has not had to use her rescue inhaler at all since last office visit.  Patient reports she is had to use her nebulizer one time since last office visit.  Patient does report some dyspnea with exertion but this she feels is back to her baseline of where she was about 3 months ago prior to her last two COPD exacerbations.  MMRC - Breathlessness Score 2 - on level ground, I walk slower than people of the same age because of breathlessness, or have to stop for breathe when walking to my own pace  Patient denies any other symptoms or concerns at this time.     Tests:   CT scan chest 11/26/12- Right lingular opacity,  scattered sub cm pulmonary nodules, images reviewed.  CT 04/05/16- Stable pulmonary nodules Severe COPD. Chest x-ray 01/12/2018- hyperinflation, no active pulmonary disease.  Stable 7 mm opacity in the left midlung. 03/30/2018-chest x-ray- stable hyperinflation with emphysematous changes, stable nodular density mid left chest which is unchanged on 12/11/2015, no active cardiopulmonary disease .  PFTs 04/02/16 FVC 2.54 (107%), FEV1 1.43 (82%), F/F 56, TLC 107%, DLCO 42% >>>Mod obstructive lung disease with DLCO impairment.  No bronchodilator response  01/13/2018-pulmonary function test- FVC 2.46 (180% predicted), postbronchodilator ratio 59, FEV1 84, mid flow reversibility after bronchodilator, DLCO 52 >>>Mild obstructive airway disease, and significant response of bronchodilator, moderately severe diffusion defect   DATA from [facility], [place] CT of neck 04/09/16- no CT findings to suggest an acute soft tissue abnormality,  subtle asymmetric prominence of right sub-lingual tonsil. Asymmetric prominence of left vocal cord Multiple thyroid lesions Relative ectasia of left carotid bulb Moderate to advanced degenerative arthritis and disc disease  CT of the brain 04/09/16- no CT findings for acute intracranial abnormality. Minimal changes of aging.  Chest x-ray 04/09/16- no acute cardiac pulmonary disease. Emphysema.  EKG 04/09/16- sinus tachycardia, incomplete left bundle branch block, LVH, anterior Q waves  03/31/2018-procalcitonin-normal below 0.10 03/31/2018- bmet-creatinine 1.4, GFR 32 03/31/2018- CBC-hemoglobin 11.9  FENO:  No results found for: NITRICOXIDE  PFT: PFT Results Latest Ref Rng & Units 01/13/2018 04/02/2016  FVC-Pre L 2.46 2.39  FVC-Predicted Pre % 108 101  FVC-Post L 2.72 2.54  FVC-Predicted Post % 119 107  Pre FEV1/FVC % % 58 55  Post FEV1/FCV % % 59 56  FEV1-Pre L 1.42 1.31  FEV1-Predicted Pre % 84 75  DLCO UNC% % 52 42  DLCO COR %Predicted % 64 56     Imaging: Dg Chest 2 View  Result Date: 03/30/2018 CLINICAL DATA:  COPD flare.  Increasing shortness of breath. EXAM: CHEST - 2 VIEW COMPARISON:  03/25/2018 FINDINGS: Stable hyperinflation and emphysematous changes. Chronic densities in the right lower chest. Stable nodular density in the mid left chest which is unchanged since 12/11/2015. No new airspace disease or lung consolidation. Heart and mediastinum are within normal limits and stable. Atherosclerotic calcifications at the aortic arch. No large pleural effusions. IMPRESSION: No active cardiopulmonary disease. Stable emphysematous changes. Electronically Signed   By: Markus Daft M.D.   On: 03/30/2018 20:15   Dg Chest 2 View  Result Date: 03/25/2018 CLINICAL DATA:  Shortness of breath, cough EXAM: CHEST - 2 VIEW COMPARISON:  03/02/2018 FINDINGS: Severe COPD changes. Heart is normal size. Scarring in the lung bases and apices. No acute confluent opacities or effusions. No acute bony abnormality. IMPRESSION: Severe COPD/chronic changes.  No active disease. Electronically Signed   By: Rolm Baptise M.D.   On: 03/25/2018 16:39    Chart Review:  03/30/2018-hospitalization-COPD exacerbation >>>Discharge date 03/31/2018 >>>Discharged on prednisone 40 mg x 5 days >>>Follow-up with PCP and pulmonology in 1 week  Specialty Problems      Pulmonary Problems   DYSPNEA    Qualifier: Diagnosis of  By: Burnice Logan  MD, Doretha Sou       Solitary pulmonary nodule    IMPRESSION: No active cardiopulmonary disease.  7 mm left mid lung pulmonary nodule which may reflect an area of scarring versus a true pulmonary nodule.   Electronically Signed  By: Kathreen Devoid  On: 10/23/2015 10:29      Other emphysema ([place])   COPD GOLD I D    PFTs 2019- FVC 2.72 (119%), FEV1 1.61 (96%), ratio 50      SOB (shortness of breath)   COPD with acute exacerbation (HCC)   COPD exacerbation (HCC)      Allergies  Allergen Reactions  . Catapres  [Clonidine Hcl] Other (See Comments)    Made pt feel horrible, shaky, weak and nausea  . Lisinopril Anaphylaxis    Tongue swelling  . Labetalol Hcl Other (See Comments)    Caused bradycardia and syncope    Immunization History  Administered Date(s) Administered  . Influenza Split 05/01/2011, 04/29/2012  . Influenza Whole 04/14/2007, 04/12/2008, 04/12/2009, 04/25/2010  . Influenza, High Dose Seasonal PF 04/12/2014, 04/25/2015, 03/13/2018  . Influenza,inj,Quad PF,6+ Mos 03/09/2013  . Influenza-Unspecified 03/22/2016, 04/15/2017  . Pneumococcal Conjugate-13 06/09/2013  . Pneumococcal Polysaccharide-23 04/07/1993, 05/08/2006  . Td 12/12/2008    Past Medical History:  Diagnosis Date  . Anxiety   . Aortic arch atherosclerosis ([place]) 06/24/2014  . Atherosclerotic ulcer of aorta ([place]) 06/24/2014  . Carotid artery occlusion   . [place] DISEASE, LUMBAR 12/16/2008  . DIVERTICULOSIS, COLON 09/30/2008  . DYSPNEA 07/13/2008  . Graves disease   . History of embolic stroke 4/62/7035   Left brain  . HYPERLIPIDEMIA 03/06/2007  . HYPERTENSION 03/06/2007  . HYPOTHYROIDISM 10/13/2007  . OSTEOARTHRITIS 03/06/2007  . Personal history of colonic polyps 09/30/2008  . Stroke ([place])    06/2014           . TOBACCO USE, QUIT 04/12/2009  .  WEAKNESS 11/09/2007    Tobacco History: Social History   Tobacco Use  Smoking Status Former Smoker  . Packs/day: 1.50  . Years: 29.00  . Pack years: 43.50  . Types: Cigarettes  . Start date: 07/08/1949  . Last attempt to quit: 07/08/1978  . Years since quitting: 39.8  Smokeless Tobacco Never Used   Counseling given: Yes  Continue not smoking  Outpatient Encounter Medications as of 04/21/2018  Medication Sig  . albuterol (PROAIR HFA) 108 (90 Base) MCG/ACT inhaler Inhale 2 puffs into the lungs every 6 (six) hours as needed for wheezing or shortness of breath.  [name] Kitchen albuterol (PROVENTIL) (2.5 MG/3ML) 0.083% nebulizer solution Take 3 mLs (2.5 mg total) by nebulization every 6  (six) hours as needed for wheezing or shortness of breath.  [name] Kitchen amLODipine (NORVASC) 10 MG tablet Take 1 tablet (10 mg total) by mouth daily.  [name] Kitchen atorvastatin (LIPITOR) 80 MG tablet TAKE 1 TABLET BY MOUTH EVERY DAY AT 6PM (Patient taking differently: Take 80 mg by mouth daily at 6 PM. )  . Fluticasone-Umeclidin-Vilant (TRELEGY ELLIPTA) 100-62.5-25 MCG/INH AEPB Inhale 1 puff into the lungs daily.   . Glycerin-Hypromellose-PEG 400 ([facility] DRY EYE RELIEF) 0.2-0.2-1 % SOLN Place 1 drop into both eyes as needed (dry eyes).   . hydrALAZINE (APRESOLINE) 25 MG tablet TAKE 3 TABLETS BY MOUTH 3 TIMES A DAY (Patient taking differently: Take 75 mg by mouth 3 (three) times daily. )  . ipratropium-albuterol (DUONEB) 0.5-2.5 (3) MG/3ML SOLN Take 3 mLs by nebulization every 6 (six) hours as needed.  [name] Kitchen levothyroxine (SYNTHROID, LEVOTHROID) 75 MCG tablet Take 1 tablet (75 mcg total) by mouth daily before breakfast.  . Multiple Vitamin (MULTIVITAMIN) tablet Take 1 tablet by mouth every evening.  [name] Kitchen Spacer/Aero-Holding Chambers (AEROCHAMBER MV) inhaler Use as instructed  . predniSONE (DELTASONE) 10 MG tablet Take 1 tablet (10 mg total) by mouth daily with breakfast. (Patient not taking: Reported on 04/21/2018)  . [DISCONTINUED] Fluticasone-Umeclidin-Vilant (TRELEGY ELLIPTA) 100-62.5-25 MCG/INH AEPB Inhale 1 puff into the lungs daily. (Patient not taking: Reported on 04/21/2018)   No facility-administered encounter medications on file as of 04/21/2018.     Review of Systems  Review of Systems  Constitutional: Positive for fatigue. Negative for chills, fever and unexpected weight change.  HENT: Negative for congestion, ear pain, postnasal drip, sinus pressure and sinus pain.   Respiratory: Positive for shortness of breath (with exertion ). Negative for cough, chest tightness and wheezing.   Cardiovascular: Negative for chest pain and palpitations.  Gastrointestinal: Negative for blood in stool, diarrhea, nausea and  vomiting.  Genitourinary: Negative for dysuria, frequency and urgency.  Musculoskeletal: Negative for arthralgias.  Skin: Negative for color change.  Allergic/Immunologic: Positive for environmental allergies. Negative for food allergies.  Neurological: Negative for dizziness, light-headedness and headaches.  Psychiatric/Behavioral: Negative for dysphoric mood. The patient is not nervous/anxious.   All other systems reviewed and are negative.    Physical Exam  BP 138/64 (BP Location: Left Arm, Cuff Size: Normal)   Pulse 92   Ht 5\' 5"  (1.651 m)   Wt 132 lb 12.8 oz (60.2 kg)   SpO2 94%   BMI 22.10 kg/m   Wt Readings from Last 5 Encounters:  04/21/18 132 lb 12.8 oz (60.2 kg)  04/02/18 134 lb 9.6 oz (61.1 kg)  03/31/18 132 lb 12.8 oz (60.2 kg)  03/25/18 132 lb 4 oz (60 kg)  03/13/18 133 lb 9.6 oz (60.6 kg)    Physical Exam  Constitutional: She is  oriented to person, place, and time and well-developed, well-nourished, and in no distress. No distress.  HENT:  Head: Normocephalic and atraumatic.  Right Ear: Hearing, tympanic membrane, external ear and ear canal normal.  Left Ear: Hearing, tympanic membrane, external ear and ear canal normal.  Nose: Nose normal. Right sinus exhibits no maxillary sinus tenderness and no frontal sinus tenderness. Left sinus exhibits no maxillary sinus tenderness and no frontal sinus tenderness.  Mouth/Throat: Uvula is midline and oropharynx is clear and moist. No oropharyngeal exudate.  Eyes: Pupils are equal, round, and reactive to light.  Neck: Normal range of motion. Neck supple. No JVD present.  Cardiovascular: Normal rate, regular rhythm and normal heart sounds.  Pulmonary/Chest: Effort normal and breath sounds normal. No accessory muscle usage. No respiratory distress. She has no decreased breath sounds. She has no wheezes. She has no rhonchi.  Musculoskeletal: Normal range of motion. She exhibits no edema.  Lymphadenopathy:    She has no  cervical adenopathy.  Neurological: She is alert and oriented to person, place, and time. Gait normal.  Skin: Skin is warm and dry. She is not diaphoretic. No erythema.  Psychiatric: Mood, memory, affect and judgment normal.  Nursing note and vitals reviewed.   04/21/18 -walking office-walking office on room air patient was able to complete 2 laps without oxygen desaturating below 98 percent  Lab Results:  CBC    Component Value Date/Time   WBC 3.2 (L) 03/31/2018 0510   RBC 3.81 (L) 03/31/2018 0510   HGB 11.9 (L) 03/31/2018 0510   HGB 13.3 08/20/2017 1430   HCT 35.9 (L) 03/31/2018 0510   HCT 38.9 08/20/2017 1430   PLT 222 03/31/2018 0510   PLT 213 08/20/2017 1430   MCV 94.2 03/31/2018 0510   MCV 92 08/20/2017 1430   MCH 31.2 03/31/2018 0510   MCHC 33.1 03/31/2018 0510   RDW 13.5 03/31/2018 0510   RDW 14.1 08/20/2017 1430   LYMPHSABS 1.3 03/30/2018 1942   MONOABS 0.3 03/30/2018 1942   EOSABS 0.2 03/30/2018 1942   BASOSABS 0.0 03/30/2018 1942    BMET    Component Value Date/Time   NA 136 03/31/2018 0510   NA 140 08/20/2017 1430   K 3.7 03/31/2018 0510   CL 102 03/31/2018 0510   CO2 23 03/31/2018 0510   GLUCOSE 235 (H) 03/31/2018 0510   BUN 22 03/31/2018 0510   BUN 18 08/20/2017 1430   CREATININE 1.40 (H) 03/31/2018 0510   CALCIUM 9.1 03/31/2018 0510   GFRNONAA 32 (L) 03/31/2018 0510   GFRAA 37 (L) 03/31/2018 0510    BNP    Component Value Date/Time   BNP 81.9 03/02/2018 0924    ProBNP    Component Value Date/Time   PROBNP 46.0 12/11/2015 1216      Assessment & Plan:   Is a 84 year old patient seen today for follow-up visit.  Patient is doing quite well.  We will have patient continue using trelegy daily, patient also continue to not use prednisone.  Patient to follow-up with our office in 2 months with Dr. Vaughan Browner.  Walk in office today should patient's oxygen desaturations did not occur.  Patient remained at 98% or greater.  COPD GOLD I D Trelegy  Ellipta  >>> 1 puff daily in the morning >>>rinse mouth out after use  >>> This inhaler contains 3 medications that help manage her respiratory status, contact our office if you cannot afford this medication or unable to remain on this medication  You do not  need to continue prednisone   Continue to use your nebulizer hand or rescue inhaler as needed every 6 hours for shortness of breath and wheezing >>> If you are having to use these more often than usual please let our office now    Note your daily symptoms > remember "red flags" for COPD:   >>>Increase in cough >>>increase in sputum production >>>increase in shortness of breath or activity  intolerance.   If you notice these symptoms, please call the office to be seen.   Follow-up in 2 months with Dr. Vaughan Browner   DYSPNEA Walk in office today patient's oxygen saturations remain greater than 98%  Follow-up with our office in 2 months    This appointment was 27 minutes along with her 50% that time direct face-to-face patient care, assessment, plan of care follow-up, walking office, discussion of those results.  Lauraine Rinne, NP 04/21/2018

## 2018-04-21 ENCOUNTER — Ambulatory Visit: Payer: Medicare Other | Admitting: Pulmonary Disease

## 2018-04-21 ENCOUNTER — Encounter: Payer: Self-pay | Admitting: Pulmonary Disease

## 2018-04-21 VITALS — BP 138/64 | HR 92 | Ht 65.0 in | Wt 132.8 lb

## 2018-04-21 DIAGNOSIS — R0602 Shortness of breath: Secondary | ICD-10-CM | POA: Diagnosis not present

## 2018-04-21 DIAGNOSIS — J42 Unspecified chronic bronchitis: Secondary | ICD-10-CM | POA: Diagnosis not present

## 2018-04-21 NOTE — Assessment & Plan Note (Signed)
Walk in office today patient's oxygen saturations remain greater than 98%  Follow-up with our office in 2 months

## 2018-04-21 NOTE — Patient Instructions (Addendum)
Trelegy Ellipta  >>> 1 puff daily in the morning >>>rinse mouth out after use  >>> This inhaler contains 3 medications that help manage her respiratory status, contact our office if you cannot afford this medication or unable to remain on this medication  You do not need to continue prednisone   Continue to use your nebulizer hand or rescue inhaler as needed every 6 hours for shortness of breath and wheezing >>> If you are having to use these more often than usual please let our office now    Note your daily symptoms > remember "red flags" for COPD:   >>>Increase in cough >>>increase in sputum production >>>increase in shortness of breath or activity  intolerance.   If you notice these symptoms, please call the office to be seen.    Follow-up in 2 months with Dr. Vaughan Browner    November/2019 we will be moving! We will no longer be at our [place] location.  Be on the look out for a post card/mailer to let you know we have officially moved.  Our new address and phone number will be:  [address] [place], [place] [address] Telephone number: [phone]  It is flu season:   >>>Remember to be washing your hands regularly, using hand sanitizer, be careful to use around herself with has contact with people who are sick will increase her chances of getting sick yourself. >>> Best ways to protect herself from the flu: Receive the yearly flu vaccine, practice good hand hygiene washing with soap and also using hand sanitizer when available, eat a nutritious meals, get adequate rest, hydrate appropriately   Please contact the office if your symptoms worsen or you have concerns that you are not improving.   Thank you for choosing [facility] for your healthcare, and for allowing [place] to partner with you on your healthcare journey. I am thankful to be able to provide care to you today.   [name] FNP-C    Chronic Obstructive Pulmonary Disease Chronic obstructive  pulmonary disease (COPD) is a long-term (chronic) lung problem. When you have COPD, it is hard for air to get in and out of your lungs. The way your lungs work will never return to normal. Usually the condition gets worse over time. There are things you can do to keep yourself as healthy as possible. Your doctor may treat your condition with:  Medicines.  Quitting smoking, if you smoke.  Rehabilitation. This may involve a team of specialists.  Oxygen.  Exercise and changes to your diet.  Lung surgery.  Comfort measures (palliative care).  Follow these instructions at home: Medicines  Take over-the-counter and prescription medicines only as told by your doctor.  Talk to your doctor before taking any cough or allergy medicines. You may need to avoid medicines that cause your lungs to be dry. Lifestyle  If you smoke, stop. Smoking makes the problem worse. If you need help quitting, ask your doctor.  Avoid being around things that make your breathing worse. This may include smoke, chemicals, and fumes.  Stay active, but remember to also rest.  Learn and use tips on how to relax.  Make sure you get enough sleep. Most adults need at least 7 hours a night.  Eat healthy foods. Eat smaller meals more often. Rest before meals. Controlled breathing  Learn and use tips on how to control your breathing as told by your doctor. Try: ? Breathing in (inhaling) through your nose for 1 second.  Then, pucker your lips and breath out (exhale) through your lips for 2 seconds. ? Putting one hand on your belly (abdomen). Breathe in slowly through your nose for 1 second. Your hand on your belly should move out. Pucker your lips and breathe out slowly through your lips. Your hand on your belly should move in as you breathe out. Controlled coughing  Learn and use controlled coughing to clear mucus from your lungs. The steps are: 1. Lean your head a little forward. 2. Breathe in deeply. 3. Try to  hold your breath for 3 seconds. 4. Keep your mouth slightly open while coughing 2 times. 5. Spit any mucus out into a tissue. 6. Rest and do the steps again 1 or 2 times as needed. General instructions  Make sure you get all the shots (vaccines) that your doctor recommends. Ask your doctor about a flu shot and a pneumonia shot.  Use oxygen therapy and therapy to help improve your lungs (pulmonary rehabilitation) if told by your doctor. If you need home oxygen therapy, ask your doctor if you should buy a tool to measure your oxygen level (oximeter).  Make a COPD action plan with your doctor. This helps you know what to do if you feel worse than usual.  Manage any other conditions you have as told by your doctor.  Avoid going outside when it is very hot, cold, or humid.  Avoid people who have a sickness you can catch (contagious).  Keep all follow-up visits as told by your doctor. This is important. Contact a doctor if:  You cough up more mucus than usual.  There is a change in the color or thickness of the mucus.  It is harder to breathe than usual.  Your breathing is faster than usual.  You have trouble sleeping.  You need to use your medicines more often than usual.  You have trouble doing your normal activities such as getting dressed or walking around the house. Get help right away if:  You have shortness of breath while resting.  You have shortness of breath that stops you from: ? Being able to talk. ? Doing normal activities.  Your chest hurts for longer than 5 minutes.  Your skin color is more blue than usual.  Your pulse oximeter shows that you have low oxygen for longer than 5 minutes.  You have a fever.  You feel too tired to breathe normally. Summary  Chronic obstructive pulmonary disease (COPD) is a long-term lung problem.  The way your lungs work will never return to normal. Usually the condition gets worse over time. There are things you can do to  keep yourself as healthy as possible.  Take over-the-counter and prescription medicines only as told by your doctor.  If you smoke, stop. Smoking makes the problem worse. This information is not intended to replace advice given to you by your health care provider. Make sure you discuss any questions you have with your health care provider. Document Released: 12/11/2007 Document Revised: 11/30/2015 Document Reviewed: 02/18/2013 Elsevier Interactive Patient Education  2017 [employer].

## 2018-04-21 NOTE — Assessment & Plan Note (Signed)
Trelegy Ellipta  >>> 1 puff daily in the morning >>>rinse mouth out after use  >>> This inhaler contains 3 medications that help manage her respiratory status, contact our office if you cannot afford this medication or unable to remain on this medication  You do not need to continue prednisone   Continue to use your nebulizer hand or rescue inhaler as needed every 6 hours for shortness of breath and wheezing >>> If you are having to use these more often than usual please let our office now    Note your daily symptoms > remember "red flags" for COPD:   >>>Increase in cough >>>increase in sputum production >>>increase in shortness of breath or activity  intolerance.   If you notice these symptoms, please call the office to be seen.   Follow-up in 2 months with Dr. Vaughan Browner

## 2018-04-24 ENCOUNTER — Other Ambulatory Visit: Payer: Self-pay | Admitting: Internal Medicine

## 2018-04-24 ENCOUNTER — Encounter: Payer: Self-pay | Admitting: Adult Health

## 2018-04-24 ENCOUNTER — Ambulatory Visit: Payer: Medicare Other | Admitting: Adult Health

## 2018-04-24 VITALS — BP 148/64 | Temp 97.7°F | Wt 133.0 lb

## 2018-04-24 DIAGNOSIS — J028 Acute pharyngitis due to other specified organisms: Secondary | ICD-10-CM | POA: Diagnosis not present

## 2018-04-24 DIAGNOSIS — B9789 Other viral agents as the cause of diseases classified elsewhere: Secondary | ICD-10-CM

## 2018-04-24 LAB — POCT RAPID STREP A (OFFICE): Rapid Strep A Screen: NEGATIVE

## 2018-04-24 NOTE — Telephone Encounter (Signed)
Cory's pt  

## 2018-04-24 NOTE — Progress Notes (Signed)
Subjective:    Patient ID: [name], female    DOB: [birth year], 84 y.o.   MRN: [id number]  Sore Throat   This is a new problem. The current episode started yesterday. Neither side of throat is experiencing more pain than the other. There has been no fever. Associated symptoms include coughing (chronic ). Pertinent negatives include no congestion, drooling, ear pain, hoarse voice, neck pain, shortness of breath, swollen glands, trouble swallowing or vomiting. She has had no exposure to strep or mono. She has tried nothing for the symptoms.      Review of Systems  Constitutional: Negative.   HENT: Positive for postnasal drip and sore throat. Negative for congestion, drooling, ear pain, hoarse voice, rhinorrhea, sinus pressure, sinus pain and trouble swallowing.   Respiratory: Positive for cough (chronic ). Negative for shortness of breath.   Gastrointestinal: Negative for vomiting.  Musculoskeletal: Negative for neck pain.   Past Medical History:  Diagnosis Date  . Anxiety   . Aortic arch atherosclerosis ([place]) 06/24/2014  . Atherosclerotic ulcer of aorta ([place]) 06/24/2014  . Carotid artery occlusion   . [place] DISEASE, LUMBAR 12/16/2008  . DIVERTICULOSIS, COLON 09/30/2008  . DYSPNEA 07/13/2008  . Graves disease   . History of embolic stroke 1/85/6314   Left brain  . HYPERLIPIDEMIA 03/06/2007  . HYPERTENSION 03/06/2007  . HYPOTHYROIDISM 10/13/2007  . OSTEOARTHRITIS 03/06/2007  . Personal history of colonic polyps 09/30/2008  . Stroke ([place])    06/2014           . TOBACCO USE, QUIT 04/12/2009  . WEAKNESS 11/09/2007    Social History   Socioeconomic History  . Marital status: Divorced    Spouse name: Not on file  . Number of children: 4  . Years of education: college  . Highest education level: Not on file  Occupational History  . Occupation: retired  Scientific laboratory technician  . Financial resource strain: Not on file  . Food insecurity:    Worry: Not on file    Inability: Not on file  .  Transportation needs:    Medical: Not on file    Non-medical: Not on file  Tobacco Use  . Smoking status: Former Smoker    Packs/day: 1.50    Years: 29.00    Pack years: 43.50    Types: Cigarettes    Start date: 07/08/1949    Last attempt to quit: 07/08/1978    Years since quitting: 39.8  . Smokeless tobacco: Never Used  Substance and Sexual Activity  . Alcohol use: Yes    Alcohol/week: 1.0 standard drinks    Types: 1 Glasses of wine per week    Comment: "red wine"- some nights  . Drug use: No  . Sexual activity: Not on file  Lifestyle  . Physical activity:    Days per week: Not on file    Minutes per session: Not on file  . Stress: Not on file  Relationships  . Social connections:    Talks on phone: Not on file    Gets together: Not on file    Attends religious service: Not on file    Active member of club or organization: Not on file    Attends meetings of clubs or organizations: Not on file    Relationship status: Not on file  . Intimate partner violence:    Fear of current or ex partner: Not on file    Emotionally abused: Not on file    Physically abused: Not on  file    Forced sexual activity: Not on file  Other Topics Concern  . Not on file  Social History Narrative      Patient is right handed.   Patient drinks 1 cup caffeine daily.    Past Surgical History:  Procedure Laterality Date  . CARDIAC CATHETERIZATION    . CATARACT EXTRACTION Bilateral   . CHOLECYSTECTOMY    . ENDARTERECTOMY Left 11/13/2015   Procedure: LEFT CAROTID ARTERY ENDARTERECTOMY;  Surgeon: Conrad Ada, MD;  Location: [facility];  Service: Vascular;  Laterality: Left;  . ESOPHAGOGASTRODUODENOSCOPY (EGD) WITH PROPOFOL N/A 10/11/2016   Procedure: ESOPHAGOGASTRODUODENOSCOPY (EGD) WITH PROPOFOL;  Surgeon: Mauri Pole, MD;  Location: [facility] ENDOSCOPY;  Service: Endoscopy;  Laterality: N/A;  . KNEE SURGERY    . PATCH ANGIOPLASTY Left 11/13/2015   Procedure: WITH 1CM X 6CM  XENOSURE BIOLOGIC PATCH  ANGIOPLASTY;  Surgeon: Conrad [place], MD;  Location: [place] [facility];  Service: Vascular;  Laterality: Left;  . TEE WITHOUT CARDIOVERSION N/A 06/24/2014   Procedure: TRANSESOPHAGEAL ECHOCARDIOGRAM (TEE);  Surgeon: Sanda Klein, MD;  Location: [facility] ENDOSCOPY;  Service: Cardiovascular;  Laterality: N/A;    Family History  Problem Relation Age of Onset  . Stomach cancer Maternal Grandmother   . Colon cancer Neg Hx     Allergies  Allergen Reactions  . Catapres [Clonidine Hcl] Other (See Comments)    Made pt feel horrible, shaky, weak and nausea  . Lisinopril Anaphylaxis    Tongue swelling  . Labetalol Hcl Other (See Comments)    Caused bradycardia and syncope    Current Outpatient Medications on File Prior to Visit  Medication Sig Dispense Refill  . albuterol (PROAIR HFA) 108 (90 Base) MCG/ACT inhaler Inhale 2 puffs into the lungs every 6 (six) hours as needed for wheezing or shortness of breath. 8.5 Inhaler 5  . albuterol (PROVENTIL) (2.5 MG/3ML) 0.083% nebulizer solution Take 3 mLs (2.5 mg total) by nebulization every 6 (six) hours as needed for wheezing or shortness of breath. 75 mL 6  . amLODipine (NORVASC) 10 MG tablet Take 1 tablet (10 mg total) by mouth daily. 90 tablet 3  . atorvastatin (LIPITOR) 80 MG tablet TAKE 1 TABLET BY MOUTH EVERY DAY AT 6PM (Patient taking differently: Take 80 mg by mouth daily at 6 PM. ) 90 tablet 2  . Fluticasone-Umeclidin-Vilant (TRELEGY ELLIPTA) 100-62.5-25 MCG/INH AEPB Inhale 1 puff into the lungs daily.     . Glycerin-Hypromellose-PEG 400 ([facility] DRY EYE RELIEF) 0.2-0.2-1 % SOLN Place 1 drop into both eyes as needed (dry eyes).     . hydrALAZINE (APRESOLINE) 25 MG tablet TAKE 3 TABLETS BY MOUTH 3 TIMES A DAY (Patient taking differently: Take 75 mg by mouth 3 (three) times daily. ) 270 tablet 9  . levothyroxine (SYNTHROID, LEVOTHROID) 75 MCG tablet Take 1 tablet (75 mcg total) by mouth daily before breakfast. 90 tablet 0  . Multiple Vitamin (MULTIVITAMIN) tablet  Take 1 tablet by mouth every evening.    [name] Kitchen Spacer/Aero-Holding Chambers (AEROCHAMBER MV) inhaler Use as instructed 1 each 0  . ipratropium-albuterol (DUONEB) 0.5-2.5 (3) MG/3ML SOLN Take 3 mLs by nebulization every 6 (six) hours as needed. (Patient not taking: Reported on 04/24/2018) 360 mL 3   No current facility-administered medications on file prior to visit.     BP (!) 148/64   Temp 97.7 F (36.5 C)   Wt 133 lb (60.3 kg)   BMI 22.13 kg/m       Objective:   Physical Exam  Constitutional: She appears well-developed and well-nourished.  HENT:  Head: Normocephalic and atraumatic.  Right Ear: External ear normal.  Left Ear: External ear normal.  Nose: Nose normal. No mucosal edema or rhinorrhea.  Mouth/Throat: Posterior oropharyngeal erythema (mild ) present. No oropharyngeal exudate, posterior oropharyngeal edema or tonsillar abscesses.  + clear PND   Nursing note and vitals reviewed.     Assessment & Plan:  1. Sore throat (viral) - POC Rapid Strep A- Negative  - Advised flonase  - warm salt water gargles  - follow up if not resolved in the next 2-3 days   [name], NP

## 2018-04-28 NOTE — Telephone Encounter (Signed)
Ok to refill but she needs to come in for an establish care visit

## 2018-04-28 NOTE — Telephone Encounter (Signed)
[name], I don't see that you have established care with this patient.  Your name has been changed to PCP but no OV.  Please advise.

## 2018-04-29 NOTE — Telephone Encounter (Signed)
Left a message for a return call.

## 2018-04-30 ENCOUNTER — Telehealth: Payer: Self-pay | Admitting: Pulmonary Disease

## 2018-04-30 NOTE — Telephone Encounter (Signed)
Spoke with the pt and notified of recs per [name] and she verbalized understanding  Will call for appt if not improving

## 2018-04-30 NOTE — Telephone Encounter (Signed)
Spoke with the pt  She is using nebs every 8 hours prn (used x 2 yesterday with some relief) Taking trelegy every morning  She has been well rested and avoiding known triggers I asked her all of your questions and all of her answers were no  She states the only thing bothering her is minimal increased in her DOE  Do you want to do just the pred? Her sputum is clear so doesn't sound like abx are needed  Please advise, thanks

## 2018-04-30 NOTE — Telephone Encounter (Signed)
Sent to the pharmacy by e-scribe.  Pt has been scheduled for TOC appt on 05/25/18.

## 2018-04-30 NOTE — Telephone Encounter (Signed)
Sorry to hear that the patient is a feeling well.  I am happy that she decided to contact us.  Ensure the patient is adherent to her Trelegy Ellipta The patient should that she is using her nebulizers every 8 hours as needed Ensure patient is resting and avoiding known triggers such as pollen and not overexerting herself  Is the patient having increased sputum?   Does not have a color to it? Does she feel more fatigued? This patient have any fevers?  Chills?  Wheezing?     The patient answers yes to these items okay to offer:  Prednisone 10mg  tablet  >>>Take 2 tablets (20 mg total) daily for the next 5 days >>> Take with food in the morning  Azithromycin 250mg  tablet  >>>Take 2 tablets (500mg  total) today, and then 1 tablet (250mg ) for the next four days  >>>take with food  >>>can also take probiotic and / or yogurt while on antibiotic       If patient does not improve under these measures she will need to present to our office for further evaluation.  Patient also needs to keep follow-up with Dr. Vaughan Browner.  [name], FNP

## 2018-04-30 NOTE — Telephone Encounter (Signed)
Called and spoke with pt who stated she still has 20 pills of prednisone left from the last time it was prescribed.  Pt stated she was taking prednisone while at the beach and due to her still having 20 tabs left, she wants to know if a taper can be done in regards to the 20 pills she has.  Pt states she is coughing up clear mucus and states she has had some worsening with her breathing due to it and had to use her neb machine.  Pt states symptoms started 2 days ago.  Aaron Edelman, please advise on this for pt. Thanks!

## 2018-04-30 NOTE — Telephone Encounter (Signed)
Prednisone 10mg  tablet  >>>Take 2 tablets (20 mg total) daily for the next 5 days >>> Take with food in the morning       Okay to offer just the prednisone see information but above.  It sounds like patient has enough tablets at home.  If patient's symptoms worsen she can follow-up with our office  [name], FNP

## 2018-05-08 ENCOUNTER — Telehealth: Payer: Self-pay | Admitting: Pulmonary Disease

## 2018-05-08 MED ORDER — AZITHROMYCIN 250 MG PO TABS: 250.0000 mg | ORAL_TABLET | ORAL | 0 refills | Status: DC

## 2018-05-08 MED ORDER — PREDNISONE 10 MG PO TABS: ORAL_TABLET | ORAL | 0 refills | Status: DC

## 2018-05-08 NOTE — Telephone Encounter (Signed)
Called and spoke to patient, states she is having increased SOB, states BM just gave her a prednisone taper however as soon she stopped taking the prednisone her chest feels tight despite using her trelegy, albuterol inhaler and neb. Patient is requesting another prednisone taper.   TP please advise.

## 2018-05-08 NOTE — Telephone Encounter (Signed)
Okay with another prednisone taper starting at 40 mg.  Reduce dose by 10 mg every 3 days Call in [place] as well

## 2018-05-08 NOTE — Telephone Encounter (Signed)
Spoke with the [name] and notified of recs per Dr. Vaughan Browner  She did not have enough pred to do the taper so I have sent new rx and also zpack

## 2018-05-20 ENCOUNTER — Ambulatory Visit: Payer: Medicare Other | Admitting: Pulmonary Disease

## 2018-05-25 ENCOUNTER — Telehealth: Payer: Self-pay | Admitting: Pulmonary Disease

## 2018-05-25 ENCOUNTER — Ambulatory Visit: Payer: Medicare Other | Admitting: Adult Health

## 2018-05-25 ENCOUNTER — Encounter: Payer: Self-pay | Admitting: Adult Health

## 2018-05-25 VITALS — BP 142/70 | Temp 98.3°F | Wt 135.0 lb

## 2018-05-25 DIAGNOSIS — Z7689 Persons encountering health services in other specified circumstances: Secondary | ICD-10-CM | POA: Diagnosis not present

## 2018-05-25 DIAGNOSIS — E7800 Pure hypercholesterolemia, unspecified: Secondary | ICD-10-CM | POA: Diagnosis not present

## 2018-05-25 DIAGNOSIS — E039 Hypothyroidism, unspecified: Secondary | ICD-10-CM

## 2018-05-25 DIAGNOSIS — J42 Unspecified chronic bronchitis: Secondary | ICD-10-CM | POA: Diagnosis not present

## 2018-05-25 DIAGNOSIS — I10 Essential (primary) hypertension: Secondary | ICD-10-CM

## 2018-05-25 MED ORDER — PREDNISONE 10 MG PO TABS: 10.0000 mg | ORAL_TABLET | Freq: Every day | ORAL | 0 refills | Status: DC

## 2018-05-25 MED ORDER — LEVOTHYROXINE SODIUM 75 MCG PO TABS: 75.0000 ug | ORAL_TABLET | Freq: Every day | ORAL | 3 refills | Status: DC

## 2018-05-25 NOTE — Telephone Encounter (Signed)
Spoke with patient's daughter. She is aware of PM's response. Prednisone has been called in. Nothing further needed at time of call.

## 2018-05-25 NOTE — Progress Notes (Signed)
Patient presents to clinic today to establish care. She is a pleasant 84 year old female who  has a past medical history of Anxiety, Aortic arch atherosclerosis ([place]) (06/24/2014), Atherosclerotic ulcer of aorta ([place]) (06/24/2014), Carotid artery occlusion, DISC DISEASE, LUMBAR (12/16/2008), DIVERTICULOSIS, COLON (09/30/2008), DYSPNEA (07/13/2008), Graves disease, History of embolic stroke (0/03/3817), HYPERLIPIDEMIA (03/06/2007), HYPERTENSION (03/06/2007), HYPOTHYROIDISM (10/13/2007), OSTEOARTHRITIS (03/06/2007), Personal history of colonic polyps (09/30/2008), Stroke ([place]), TOBACCO USE, QUIT (04/12/2009), and WEAKNESS (11/09/2007).  She is a former patient of Dr. Raliegh Ip  Acute Concerns: Establish Care  Chronic Issues: HTN - Currently prescribed Norvasc 10 mg and hydralazine 5 mg 3 times daily BP Readings from Last 3 Encounters:  05/25/18 (!) 142/70  04/24/18 (!) 148/64  04/21/18 138/64    Hyperlipidemia - Takes Lipitor 80 mg daily Lab Results  Component Value Date   CHOL 148 01/13/2018   HDL 76 01/13/2018   LDLCALC 66 01/13/2018   LDLDIRECT 141.7 05/01/2011   TRIG 31 01/13/2018   CHOLHDL 1.9 01/13/2018   Hypothyroidism - Takes Synthroid 75 mcg daily.  Lab Results  Component Value Date   TSH 0.616 03/02/2018    COPD - Is followed by Pulmonary Currently prescribed Trelegy Ellipta daily.  Also has albuterol inhaler to use as needed and albuterol nebulizer to use as needed.  Had three hospital admissions for COPD exacerbations over the last year. She is complaining of mild shortness of breath today in the office. She used her nebulizer prior to the office visit which helped/   H/o CVA - left parietal in Dec 2015.   CAD - high grade left ICA stenosis with plaque ulceration. Endarterectomy in 2015. Is seen yearly.   Health Maintenance: Dental -- Upper and lower dentures.  Vision -- Does not do routine care  Immunizations -- utd Colonoscopy -- No longer needs Mammogram -- No longer  needs PAP -- No longer needs Bone Density --  No longer needs  Treatment Team  1. Cardiology - Dr. Doreatha Martin  2. Pulmonary - Dr. Vaughan Browner  3. Vascular Surgery - Dr. Bridgett Larsson   Past Medical History:  Diagnosis Date  . Anxiety   . Aortic arch atherosclerosis ([place]) 06/24/2014  . Atherosclerotic ulcer of aorta ([place]) 06/24/2014  . Carotid artery occlusion   . [place] DISEASE, LUMBAR 12/16/2008  . DIVERTICULOSIS, COLON 09/30/2008  . DYSPNEA 07/13/2008  . Graves disease   . History of embolic stroke 2/99/3716   Left brain  . HYPERLIPIDEMIA 03/06/2007  . HYPERTENSION 03/06/2007  . HYPOTHYROIDISM 10/13/2007  . OSTEOARTHRITIS 03/06/2007  . Personal history of colonic polyps 09/30/2008  . Stroke ([place])    06/2014           . TOBACCO USE, QUIT 04/12/2009  . WEAKNESS 11/09/2007    Past Surgical History:  Procedure Laterality Date  . CARDIAC CATHETERIZATION    . CATARACT EXTRACTION Bilateral   . CHOLECYSTECTOMY    . ENDARTERECTOMY Left 11/13/2015   Procedure: LEFT CAROTID ARTERY ENDARTERECTOMY;  Surgeon: Conrad Genoa, MD;  Location: [facility];  Service: Vascular;  Laterality: Left;  . ESOPHAGOGASTRODUODENOSCOPY (EGD) WITH PROPOFOL N/A 10/11/2016   Procedure: ESOPHAGOGASTRODUODENOSCOPY (EGD) WITH PROPOFOL;  Surgeon: Mauri Pole, MD;  Location: [facility];  Service: Endoscopy;  Laterality: N/A;  . KNEE SURGERY    . PATCH ANGIOPLASTY Left 11/13/2015   Procedure: WITH 1CM X 6CM  XENOSURE BIOLOGIC PATCH ANGIOPLASTY;  Surgeon: Conrad [place], MD;  Location: [facility];  Service: Vascular;  Laterality: Left;  . TEE  WITHOUT CARDIOVERSION N/A 06/24/2014   Procedure: TRANSESOPHAGEAL ECHOCARDIOGRAM (TEE);  Surgeon: Sanda Klein, MD;  Location: [facility] ENDOSCOPY;  Service: Cardiovascular;  Laterality: N/A;    Current Outpatient Medications on File Prior to Visit  Medication Sig Dispense Refill  . albuterol (PROAIR HFA) 108 (90 Base) MCG/ACT inhaler Inhale 2 puffs into the lungs every 6 (six) hours as needed for  wheezing or shortness of breath. 8.5 Inhaler 5  . albuterol (PROVENTIL) (2.5 MG/3ML) 0.083% nebulizer solution Take 3 mLs (2.5 mg total) by nebulization every 6 (six) hours as needed for wheezing or shortness of breath. 75 mL 6  . amLODipine (NORVASC) 10 MG tablet Take 1 tablet (10 mg total) by mouth daily. 90 tablet 3  . atorvastatin (LIPITOR) 80 MG tablet TAKE 1 TABLET BY MOUTH EVERY DAY AT 6PM 90 tablet 0  . Fluticasone-Umeclidin-Vilant (TRELEGY ELLIPTA) 100-62.5-25 MCG/INH AEPB Inhale 1 puff into the lungs daily.     . Glycerin-Hypromellose-PEG 400 ([facility] DRY EYE RELIEF) 0.2-0.2-1 % SOLN Place 1 drop into both eyes as needed (dry eyes).     . hydrALAZINE (APRESOLINE) 25 MG tablet TAKE 3 TABLETS BY MOUTH 3 TIMES A DAY (Patient taking differently: Take 75 mg by mouth 3 (three) times daily. ) 270 tablet 9  . ipratropium-albuterol (DUONEB) 0.5-2.5 (3) MG/3ML SOLN Take 3 mLs by nebulization every 6 (six) hours as needed. 360 mL 3  . levothyroxine (SYNTHROID, LEVOTHROID) 75 MCG tablet Take 1 tablet (75 mcg total) by mouth daily before breakfast. 90 tablet 0  . Multiple Vitamin (MULTIVITAMIN) tablet Take 1 tablet by mouth every evening.    [name] Kitchen Spacer/Aero-Holding Chambers (AEROCHAMBER MV) inhaler Use as instructed 1 each 0   No current facility-administered medications on file prior to visit.     Allergies  Allergen Reactions  . Catapres [Clonidine Hcl] Other (See Comments)    Made pt feel horrible, shaky, weak and nausea  . Lisinopril Anaphylaxis    Tongue swelling  . Labetalol Hcl Other (See Comments)    Caused bradycardia and syncope    Family History  Problem Relation Age of Onset  . Stomach cancer Maternal Grandmother   . Colon cancer Neg Hx     Social History   Socioeconomic History  . Marital status: Divorced    Spouse name: Not on file  . Number of children: 4  . Years of education: college  . Highest education level: Not on file  Occupational History  . Occupation: retired   Scientific laboratory technician  . Financial resource strain: Not on file  . Food insecurity:    Worry: Not on file    Inability: Not on file  . Transportation needs:    Medical: Not on file    Non-medical: Not on file  Tobacco Use  . Smoking status: Former Smoker    Packs/day: 1.50    Years: 29.00    Pack years: 43.50    Types: Cigarettes    Start date: 07/08/1949    Last attempt to quit: 07/08/1978    Years since quitting: 39.9  . Smokeless tobacco: Never Used  Substance and Sexual Activity  . Alcohol use: Yes    Alcohol/week: 1.0 standard drinks    Types: 1 Glasses of wine per week    Comment: "red wine"- some nights  . Drug use: No  . Sexual activity: Not on file  Lifestyle  . Physical activity:    Days per week: Not on file    Minutes per session: Not on file  .  Stress: Not on file  Relationships  . Social connections:    Talks on phone: Not on file    Gets together: Not on file    Attends religious service: Not on file    Active member of club or organization: Not on file    Attends meetings of clubs or organizations: Not on file    Relationship status: Not on file  . Intimate partner violence:    Fear of current or ex partner: Not on file    Emotionally abused: Not on file    Physically abused: Not on file    Forced sexual activity: Not on file  Other Topics Concern  . Not on file  Social History Narrative      Patient is right handed.   Patient drinks 1 cup caffeine daily.    Review of Systems  Constitutional: Negative.   HENT: Positive for hearing loss.   Eyes: Negative.   Respiratory: Positive for shortness of breath and wheezing.   Cardiovascular: Negative.   Genitourinary: Negative.   Musculoskeletal: Negative.   Skin: Negative.   Neurological: Negative.   Psychiatric/Behavioral: Negative.   All other systems reviewed and are negative.      BP (!) 142/70   Temp 98.3 F (36.8 C)   Wt 135 lb (61.2 kg)   BMI 22.47 kg/m   Physical Exam  Constitutional: She  is oriented to person, place, and time. She appears well-developed and well-nourished. No distress.  Eyes: Pupils are equal, round, and reactive to light. Conjunctivae and EOM are normal. Right eye exhibits no discharge. Left eye exhibits no discharge.  Cardiovascular: Normal rate, regular rhythm, normal heart sounds and intact distal pulses.  Pulmonary/Chest: Effort normal. She has wheezes (trace throughout ).  Musculoskeletal: Normal range of motion. She exhibits no edema, tenderness or deformity.  Neurological: She is alert and oriented to person, place, and time.  Skin: Skin is warm and dry. She is not diaphoretic.  Psychiatric: She has a normal mood and affect. Her behavior is normal. Thought content normal.  Nursing note and vitals reviewed.     Recent Results (from the past 2160 hour(s))  Urine culture     Status: Abnormal   Collection Time: 03/02/18  9:16 AM  Result Value Ref Range   Specimen Description URINE, RANDOM    Special Requests      NONE Performed at [facility], [address], [place], [place] [address]    Culture MULTIPLE SPECIES PRESENT, SUGGEST RECOLLECTION (A)    Report Status 03/03/2018 FINAL   CBC with Differential     Status: None   Collection Time: 03/02/18  9:24 AM  Result Value Ref Range   WBC 5.6 4.0 - 10.5 K/uL   RBC 4.34 3.87 - 5.11 MIL/uL   Hemoglobin 13.3 12.0 - 15.0 g/dL   HCT 41.5 36.0 - 46.0 %   MCV 95.6 78.0 - 100.0 fL   MCH 30.6 26.0 - 34.0 pg   MCHC 32.0 30.0 - 36.0 g/dL   RDW 12.9 11.5 - 15.5 %   Platelets 206 150 - 400 K/uL   Neutrophils Relative % 56 %   Neutro Abs 3.2 1.7 - 7.7 K/uL   Lymphocytes Relative 24 %   Lymphs Abs 1.4 0.7 - 4.0 K/uL   Monocytes Relative 13 %   Monocytes Absolute 0.7 0.1 - 1.0 K/uL   Eosinophils Relative 6 %   Eosinophils Absolute 0.3 0.0 - 0.7 K/uL   Basophils Relative 1 %  Basophils Absolute 0.1 0.0 - 0.1 K/uL   Immature Granulocytes 0 %   Abs Immature Granulocytes 0.0 0.0 - 0.1 K/uL     Comment: Performed at [facility], [place] [address], [place], [place] [address]  Comprehensive metabolic panel     Status: Abnormal   Collection Time: 03/02/18  9:24 AM  Result Value Ref Range   Sodium 138 135 - 145 mmol/L   Potassium 3.9 3.5 - 5.1 mmol/L   Chloride 104 98 - 111 mmol/L   CO2 25 22 - 32 mmol/L   Glucose, Bld 138 (H) 70 - 99 mg/dL   BUN 14 8 - 23 mg/dL   Creatinine, Ser 1.20 (H) 0.44 - 1.00 mg/dL   Calcium 9.0 8.9 - 10.3 mg/dL   Total Protein 6.4 (L) 6.5 - 8.1 g/dL   Albumin 3.5 3.5 - 5.0 g/dL   AST 21 15 - 41 U/L   ALT 16 0 - 44 U/L   Alkaline Phosphatase 140 (H) 38 - 126 U/L   Total Bilirubin 0.9 0.3 - 1.2 mg/dL   GFR calc non Af Amer 39 (L) >60 mL/min   GFR calc Af Amer 45 (L) >60 mL/min    Comment: (NOTE) The eGFR has been calculated using the CKD EPI equation. This calculation has not been validated in all clinical situations. eGFR's persistently <60 mL/min signify possible Chronic Kidney Disease.    Anion gap 9 5 - 15    Comment: Performed at [facility] [address] [place], [place] [address]  Brain natriuretic peptide     Status: None   Collection Time: 03/02/18  9:24 AM  Result Value Ref Range   B Natriuretic Peptide 81.9 0.0 - 100.0 pg/mL    Comment: Performed at [facility] [address], [place], [place] [address]  I-stat troponin, ED     Status: None   Collection Time: 03/02/18  9:40 AM  Result Value Ref Range   Troponin i, poc 0.03 0.00 - 0.08 ng/mL   Comment 3            Comment: Due to the release kinetics of cTnI, a negative result within the first hours of the onset of symptoms does not rule out myocardial infarction with certainty. If myocardial infarction is still suspected, repeat the test at appropriate intervals.   I-stat troponin, ED     Status: None   Collection Time: 03/02/18 12:49 PM  Result Value Ref Range   Troponin i, poc 0.02 0.00 - 0.08 ng/mL   Comment 3            Comment: Due to the release  kinetics of cTnI, a negative result within the first hours of the onset of symptoms does not rule out myocardial infarction with certainty. If myocardial infarction is still suspected, repeat the test at appropriate intervals.   Urinalysis, Routine w reflex microscopic     Status: None   Collection Time: 03/02/18  1:46 PM  Result Value Ref Range   Color, Urine YELLOW YELLOW   APPearance CLEAR CLEAR   Specific Gravity, Urine 1.010 1.005 - 1.030   pH 6.0 5.0 - 8.0   Glucose, UA NEGATIVE NEGATIVE mg/dL   Hgb urine dipstick NEGATIVE NEGATIVE   Bilirubin Urine NEGATIVE NEGATIVE   Ketones, ur NEGATIVE NEGATIVE mg/dL   Protein, ur NEGATIVE NEGATIVE mg/dL   Nitrite NEGATIVE NEGATIVE   Leukocytes, UA NEGATIVE NEGATIVE    Comment: Performed at [facility]  [facility], [address] [place], [place] [address]  TSH     Status: None   Collection Time: 03/02/18  6:45 PM  Result Value Ref Range   TSH 0.616 0.350 - 4.500 uIU/mL    Comment: Performed by a 3rd Generation assay with a functional sensitivity of <=0.01 uIU/mL. Performed at [facility], [place] [address], [place], [place] [address]   Basic metabolic panel     Status: Abnormal   Collection Time: 03/03/18  2:29 AM  Result Value Ref Range   Sodium 138 135 - 145 mmol/L   Potassium 4.3 3.5 - 5.1 mmol/L   Chloride 105 98 - 111 mmol/L   CO2 24 22 - 32 mmol/L   Glucose, Bld 165 (H) 70 - 99 mg/dL   BUN 23 8 - 23 mg/dL   Creatinine, Ser 1.50 (H) 0.44 - 1.00 mg/dL   Calcium 9.1 8.9 - 10.3 mg/dL   GFR calc non Af Amer 29 (L) >60 mL/min   GFR calc Af Amer 34 (L) >60 mL/min    Comment: (NOTE) The eGFR has been calculated using the CKD EPI equation. This calculation has not been validated in all clinical situations. eGFR's persistently <60 mL/min signify possible Chronic Kidney Disease.    Anion gap 9 5 - 15    Comment: Performed at [facility], [place] [address], [place], [place] [address]  CBC     Status: Abnormal   Collection  Time: 03/03/18  2:29 AM  Result Value Ref Range   WBC 2.2 (L) 4.0 - 10.5 K/uL   RBC 3.80 (L) 3.87 - 5.11 MIL/uL   Hemoglobin 11.6 (L) 12.0 - 15.0 g/dL   HCT 36.2 36.0 - 46.0 %   MCV 95.3 78.0 - 100.0 fL   MCH 30.5 26.0 - 34.0 pg   MCHC 32.0 30.0 - 36.0 g/dL   RDW 12.7 11.5 - 15.5 %   Platelets 186 150 - 400 K/uL    Comment: Performed at [facility], [place] [address], [place], [place] [address]  CBC with Differential/Platelet     Status: Abnormal   Collection Time: 03/25/18 11:38 AM  Result Value Ref Range   WBC 3.5 (L) 4.0 - 10.5 K/uL   RBC 4.24 3.87 - 5.11 Mil/uL   Hemoglobin 13.1 12.0 - 15.0 g/dL   HCT 39.0 36.0 - 46.0 %   MCV 92.0 78.0 - 100.0 fl   MCHC 33.6 30.0 - 36.0 g/dL   RDW 13.5 11.5 - 15.5 %   Platelets 223.0 150.0 - 400.0 K/uL   Neutrophils Relative % 64.3 43.0 - 77.0 %   Lymphocytes Relative 16.3 12.0 - 46.0 %   Monocytes Relative 12.6 (H) 3.0 - 12.0 %   Eosinophils Relative 5.4 (H) 0.0 - 5.0 %   Basophils Relative 1.4 0.0 - 3.0 %   Neutro Abs 2.3 1.4 - 7.7 K/uL   Lymphs Abs 0.6 (L) 0.7 - 4.0 K/uL   Monocytes Absolute 0.4 0.1 - 1.0 K/uL   Eosinophils Absolute 0.2 0.0 - 0.7 K/uL   Basophils Absolute 0.1 0.0 - 0.1 K/uL  Basic metabolic panel     Status: Abnormal   Collection Time: 03/25/18 11:38 AM  Result Value Ref Range   Sodium 138 135 - 145 mEq/L   Potassium 3.8 3.5 - 5.1 mEq/L   Chloride 102 96 - 112 mEq/L   CO2 28 19 - 32 mEq/L   Glucose, Bld 118 (H) 70 - 99 mg/dL  BUN 12 6 - 23 mg/dL   Creatinine, Ser 1.04 0.40 - 1.20 mg/dL   Calcium 8.8 8.4 - 10.5 mg/dL   GFR 52.82 (L) >60.00 mL/min  Basic metabolic panel     Status: Abnormal   Collection Time: 03/30/18  7:42 PM  Result Value Ref Range   Sodium 138 135 - 145 mmol/L   Potassium 3.4 (L) 3.5 - 5.1 mmol/L   Chloride 102 98 - 111 mmol/L   CO2 23 22 - 32 mmol/L   Glucose, Bld 132 (H) 70 - 99 mg/dL   BUN 17 8 - 23 mg/dL   Creatinine, Ser 1.19 (H) 0.44 - 1.00 mg/dL   Calcium 9.1 8.9 - 10.3 mg/dL    GFR calc non Af Amer 39 (L) >60 mL/min   GFR calc Af Amer 45 (L) >60 mL/min    Comment: (NOTE) The eGFR has been calculated using the CKD EPI equation. This calculation has not been validated in all clinical situations. eGFR's persistently <60 mL/min signify possible Chronic Kidney Disease.    Anion gap 13 5 - 15    Comment: Performed at [facility], [place] [address] [place], [place] [address]  CBC with Differential/Platelet     Status: None   Collection Time: 03/30/18  7:42 PM  Result Value Ref Range   WBC 4.5 4.0 - 10.5 K/uL   RBC 4.04 3.87 - 5.11 MIL/uL   Hemoglobin 12.4 12.0 - 15.0 g/dL   HCT 37.8 36.0 - 46.0 %   MCV 93.6 78.0 - 100.0 fL   MCH 30.7 26.0 - 34.0 pg   MCHC 32.8 30.0 - 36.0 g/dL   RDW 13.5 11.5 - 15.5 %   Platelets 244 150 - 400 K/uL   Neutrophils Relative % 59 %   Neutro Abs 2.7 1.7 - 7.7 K/uL   Lymphocytes Relative 29 %   Lymphs Abs 1.3 0.7 - 4.0 K/uL   Monocytes Relative 7 %   Monocytes Absolute 0.3 0.1 - 1.0 K/uL   Eosinophils Relative 4 %   Eosinophils Absolute 0.2 0.0 - 0.7 K/uL   Basophils Relative 1 %   Basophils Absolute 0.0 0.0 - 0.1 K/uL    Comment: Performed at [facility], [place] [address] [place], [place] [address]  I-stat troponin, ED     Status: None   Collection Time: 03/30/18  7:49 PM  Result Value Ref Range   Troponin i, poc 0.00 0.00 - 0.08 ng/mL   Comment 3            Comment: Due to the release kinetics of cTnI, a negative result within the first hours of the onset of symptoms does not rule out myocardial infarction with certainty. If myocardial infarction is still suspected, repeat the test at appropriate intervals.   Blood gas, venous     Status: Abnormal   Collection Time: 03/30/18  7:59 PM  Result Value Ref Range   FIO2 21.00    pH, Ven 7.389 7.250 - 7.430   pCO2, Ven 41.7 (L) 44.0 - 60.0 mmHg   pO2, Ven 39.7 32.0 - 45.0 mmHg   Bicarbonate 24.6 20.0 - 28.0 mmol/L   Acid-Base Excess  0.1 0.0 - 2.0 mmol/L   O2 Saturation 68.5 %   Patient temperature 98.6    Collection site VEIN    Drawn by COLLECTED BY NURSE    Sample type VENOUS     Comment: Performed at [facility] [address]  [place], [place] [address]  HIV antibody     Status: None   Collection Time: 03/31/18  5:10 AM  Result Value Ref Range   HIV Screen 4th Generation wRfx Non Reactive Non Reactive    Comment: (NOTE) Performed At: [facility] [place], [place] [id number] [place] Farmer MD [id number]   CBC     Status: Abnormal   Collection Time: 03/31/18  5:10 AM  Result Value Ref Range   WBC 3.2 (L) 4.0 - 10.5 K/uL   RBC 3.81 (L) 3.87 - 5.11 MIL/uL   Hemoglobin 11.9 (L) 12.0 - 15.0 g/dL   HCT 35.9 (L) 36.0 - 46.0 %   MCV 94.2 78.0 - 100.0 fL   MCH 31.2 26.0 - 34.0 pg   MCHC 33.1 30.0 - 36.0 g/dL   RDW 13.5 11.5 - 15.5 %   Platelets 222 150 - 400 K/uL    Comment: Performed at [facility], [place] [address] [place], [place] [address]  Basic metabolic panel     Status: Abnormal   Collection Time: 03/31/18  5:10 AM  Result Value Ref Range   Sodium 136 135 - 145 mmol/L   Potassium 3.7 3.5 - 5.1 mmol/L   Chloride 102 98 - 111 mmol/L   CO2 23 22 - 32 mmol/L   Glucose, Bld 235 (H) 70 - 99 mg/dL   BUN 22 8 - 23 mg/dL   Creatinine, Ser 1.40 (H) 0.44 - 1.00 mg/dL   Calcium 9.1 8.9 - 10.3 mg/dL   GFR calc non Af Amer 32 (L) >60 mL/min   GFR calc Af Amer 37 (L) >60 mL/min    Comment: (NOTE) The eGFR has been calculated using the CKD EPI equation. This calculation has not been validated in all clinical situations. eGFR's persistently <60 mL/min signify possible Chronic Kidney Disease.    Anion gap 11 5 - 15    Comment: Performed at [facility], [place] [address], [place],  [address]  Procalcitonin - Baseline     Status: None   Collection Time: 03/31/18 11:07 AM  Result Value Ref Range   Procalcitonin <0.10 ng/mL     Comment:        Interpretation: PCT (Procalcitonin) <= 0.5 ng/mL: Systemic infection (sepsis) is not likely. Local bacterial infection is possible. (NOTE)       Sepsis PCT Algorithm           Lower Respiratory Tract                                      Infection PCT Algorithm    ----------------------------     ----------------------------         PCT < 0.25 ng/mL                PCT < 0.10 ng/mL         Strongly encourage             Strongly discourage   discontinuation of antibiotics    initiation of antibiotics    ----------------------------     -----------------------------       PCT 0.25 - 0.50 ng/mL            PCT 0.10 - 0.25 ng/mL               OR       >80% decrease in PCT  Discourage initiation of                                            antibiotics      Encourage discontinuation           of antibiotics    ----------------------------     -----------------------------         PCT >= 0.50 ng/mL              PCT 0.26 - 0.50 ng/mL               AND        <80% decrease in PCT             Encourage initiation of                                             antibiotics       Encourage continuation           of antibiotics    ----------------------------     -----------------------------        PCT >= 0.50 ng/mL                  PCT > 0.50 ng/mL               AND         increase in PCT                  Strongly encourage                                      initiation of antibiotics    Strongly encourage escalation           of antibiotics                                     -----------------------------                                           PCT <= 0.25 ng/mL                                                 OR                                        > 80% decrease in PCT                                     Discontinue / Do not initiate  antibiotics Performed at [facility], [place] [address] [place], [place] [address]   POC Rapid Strep A     Status: Normal   Collection Time: 04/24/18  4:15 PM  Result Value Ref Range   Rapid Strep A Screen Negative Negative    Assessment/Plan: 1. Encounter to establish care - Six month follow up or sooner if needed  2. Chronic bronchitis, unspecified chronic bronchitis type ([place]) - Follow up with Pulmonary as directed - She has prednisone at home. She wanted to know if it was ok to take 5 mg daily until she can be seen by pulmonary. I am ok with her doing this   3. Hypothyroidism, unspecified type - Well controlled on Synthroid 75 mcg. No dose changes   4. Pure hypercholesterolemia - Continue with lipitor 80 mg - well controlled.   5. Essential hypertension - At baseline today  - Follow up with cardiology as directed   [name], NP

## 2018-05-25 NOTE — Telephone Encounter (Signed)
Ok to call in 10 mg prednisone daily. We will reassess at return visit in Dec

## 2018-05-25 NOTE — Telephone Encounter (Signed)
Called and spoke with patients daughter, she stated that the patient was given a prescription of prednisone. It was mentioned she may need to go on a maintenance dose to keep breathing ok. Patients daughter stated when the patient is off the prednisone she can tell and she feels better when she is on the prednisone. She would like to have a daily dose of this.   Dr. Vaughan Browner please advise, thank you.

## 2018-06-17 ENCOUNTER — Other Ambulatory Visit: Payer: Self-pay

## 2018-06-17 MED ORDER — PREDNISONE 10 MG PO TABS: 10.0000 mg | ORAL_TABLET | Freq: Every day | ORAL | 0 refills | Status: DC

## 2018-06-22 ENCOUNTER — Ambulatory Visit: Payer: Medicare Other | Admitting: Pulmonary Disease

## 2018-06-22 ENCOUNTER — Encounter: Payer: Self-pay | Admitting: Pulmonary Disease

## 2018-06-22 VITALS — BP 152/64 | HR 104 | Ht 65.0 in | Wt 137.8 lb

## 2018-06-22 DIAGNOSIS — J449 Chronic obstructive pulmonary disease, unspecified: Secondary | ICD-10-CM

## 2018-06-22 MED ORDER — PREDNISONE 5 MG PO TABS: 5.0000 mg | ORAL_TABLET | Freq: Every day | ORAL | 0 refills | Status: DC

## 2018-06-22 NOTE — Addendum Note (Signed)
Addended by: Maryanna Shape A on: 06/22/2018 10:41 AM   Modules accepted: Orders

## 2018-06-22 NOTE — Patient Instructions (Signed)
Glad you are doing well with your breathing Continue the inhaler and nebulizers as prescribed We will reduce the prednisone to 5 mg for a month and taper off Please give [place] a call if there is any change in your symptoms with this regimen Follow-up in 3 months.

## 2018-06-22 NOTE — Progress Notes (Signed)
[name]    [id number]    1925/07/08  Primary Care Physician:Nafziger, Tommi Rumps, NP  Referring Physician: [name], NP [place], [place] [address]  Chief complaint:  Follow up for  Moderate COPD Vocal cord paralysis  HPI: [name] is a 84 year old with complaints of dyspnea on exertion. The problem started in May 2017 after a carotid artery end arterectomy. This procedure was complicated by left vocal cord paralysis, hoarseness. She complains of daily cough with sputum production, wheezing. She denies any fevers, chills, hemoptysis. She had been tried on Advair for 2 months with some improvement in symptoms. She has difficulty clearing secretions. She has has a flutter valve and spirometer at home but is not using this on a regular basis.  She was hospitalized in the emergency room in October 2017 for COPD exacerbation. She was discharged on a prednisone taper and amoxicillin. Records from this hospitalization reviewed below in data section Hospitalized in April 2018 with severe anemia, bleeding AVMs.  Underwent EGD with cauterization, clipping of bleeding lesion. Hospitalized for 2 days in July 2019 for mild COPD exacerbation.  Interim History: She has had multiple exacerbations over the past year. Continues on Trelegy.  Started on low-dose prednisone at 10 mg  States that breathing is doing well with this regimen.  Still has some cough with congestion, dyspnea on exertion  Outpatient Encounter Medications as of 06/22/2018  Medication Sig  . albuterol (PROAIR HFA) 108 (90 Base) MCG/ACT inhaler Inhale 2 puffs into the lungs every 6 (six) hours as needed for wheezing or shortness of breath.  [name] Kitchen albuterol (PROVENTIL) (2.5 MG/3ML) 0.083% nebulizer solution Take 3 mLs (2.5 mg total) by nebulization every 6 (six) hours as needed for wheezing or shortness of breath.  [name] Kitchen amLODipine (NORVASC) 10 MG tablet Take 1 tablet (10 mg total) by mouth daily.  [name] Kitchen  atorvastatin (LIPITOR) 80 MG tablet TAKE 1 TABLET BY MOUTH EVERY DAY AT 6PM  . Fluticasone-Umeclidin-Vilant (TRELEGY ELLIPTA) 100-62.5-25 MCG/INH AEPB Inhale 1 puff into the lungs daily.   . Glycerin-Hypromellose-PEG 400 ([facility] DRY EYE RELIEF) 0.2-0.2-1 % SOLN Place 1 drop into both eyes as needed (dry eyes).   . hydrALAZINE (APRESOLINE) 25 MG tablet TAKE 3 TABLETS BY MOUTH 3 TIMES A DAY (Patient taking differently: Take 75 mg by mouth 3 (three) times daily. )  . ipratropium-albuterol (DUONEB) 0.5-2.5 (3) MG/3ML SOLN Take 3 mLs by nebulization every 6 (six) hours as needed.  [name] Kitchen levothyroxine (SYNTHROID, LEVOTHROID) 75 MCG tablet Take 1 tablet (75 mcg total) by mouth daily before breakfast.  . Multiple Vitamin (MULTIVITAMIN) tablet Take 1 tablet by mouth every evening.  . predniSONE (DELTASONE) 10 MG tablet Take 1 tablet (10 mg total) by mouth daily with breakfast.  . Spacer/Aero-Holding Chambers (AEROCHAMBER MV) inhaler Use as instructed   No facility-administered encounter medications on file as of 06/22/2018.    Physical Exam: Blood pressure (!) 152/64, pulse (!) 104, height 5\' 5"  (1.651 m), weight 137 lb 12.8 oz (62.5 kg), SpO2 93 %. Gen:      No acute distress HEENT:  EOMI, sclera anicteric Neck:     No masses; no thyromegaly Lungs:    Clear to auscultation bilaterally; normal respiratory effort CV:         Regular rate and rhythm; no murmurs Abd:      + bowel sounds; soft, non-tender; no palpable masses, no distension Ext:    No edema; adequate peripheral perfusion  Skin:      Warm and dry; no rash Neuro: alert and oriented x 3 Psych: normal mood and affect  Data Reviewed: CT scan chest 11/26/12- Right lingular opacity, scattered sub cm pulmonary nodules, images reviewed.  CT 04/05/16- Stable pulmonary nodules Severe COPD. Chest x-ray 01/12/2018- hyperinflation, no active pulmonary disease.  Stable 7 mm opacity in the left midlung. I reviewed the images personally.  PFTs 04/02/16 FVC 2.54  (107%), FEV1 1.43 (82%), F/F 56, TLC 107%, DLCO 42% Mod obstructive lung disease with DLCO impairment.  No bronchodilator response  DATA from [facility], [place] CT of neck 04/09/16- no CT findings to suggest an acute soft tissue abnormality,  subtle asymmetric prominence of right sub-lingual tonsil. Asymmetric prominence of left vocal cord Multiple thyroid lesions Relative ectasia of left carotid bulb Moderate to advanced degenerative arthritis and disc disease  CT of the brain 04/09/16- no CT findings for acute intracranial abnormality. Minimal changes of aging.  Chest x-ray 04/09/16- no acute cardiac pulmonary disease. Emphysema.  EKG 04/09/16- sinus tachycardia, incomplete left bundle branch block, LVH, anterior Q waves  Assessment:  Moderate COPD. Although she has moderate obstruction on PFTs her COPD is likely severe based on lung imaging and DLCO impairment.  Currently on Trelegy inhaler She is on prednisone at 10 mg.  We will try to taper it off.  Reduce to 5 mg for 1 month and then off Monitor symptoms as we are tapering off prednisone. If we are unable to get off prednisone then we can try Daliresp  Lung nodule Stable on repeat CT from 2014 to 2017. Likely benign.  Plan/Recommendations: - Continue Trelegy, albuterol as needed - Taper prednisone to 5 mg for a month and then off. - Use incentive spirometer and flutter valve 3 times/ day.  Marshell Garfinkel MD [facility] 06/22/2018, 10:18 AM  CC: [name], NP

## 2018-07-24 ENCOUNTER — Ambulatory Visit: Payer: Medicare Other | Admitting: Adult Health

## 2018-07-24 ENCOUNTER — Other Ambulatory Visit: Payer: Self-pay | Admitting: Adult Health

## 2018-07-24 NOTE — Telephone Encounter (Signed)
Sent to the pharmacy by e-scribe. 

## 2018-08-17 ENCOUNTER — Encounter (HOSPITAL_COMMUNITY): Payer: Self-pay | Admitting: Emergency Medicine

## 2018-08-17 ENCOUNTER — Other Ambulatory Visit: Payer: Self-pay

## 2018-08-17 ENCOUNTER — Ambulatory Visit: Payer: Medicare Other | Admitting: Nurse Practitioner

## 2018-08-17 ENCOUNTER — Ambulatory Visit: Payer: Medicare Other | Admitting: Pulmonary Disease

## 2018-08-17 ENCOUNTER — Emergency Department (HOSPITAL_COMMUNITY)
Admission: EM | Admit: 2018-08-17 | Discharge: 2018-08-17 | Disposition: A | Discharge status: Home / Self Care | Payer: Medicare Other | Attending: Emergency Medicine | Admitting: Emergency Medicine

## 2018-08-17 ENCOUNTER — Emergency Department (HOSPITAL_COMMUNITY): Payer: Medicare Other

## 2018-08-17 DIAGNOSIS — R0602 Shortness of breath: Secondary | ICD-10-CM | POA: Diagnosis present

## 2018-08-17 DIAGNOSIS — Z87891 Personal history of nicotine dependence: Secondary | ICD-10-CM | POA: Diagnosis not present

## 2018-08-17 DIAGNOSIS — J441 Chronic obstructive pulmonary disease with (acute) exacerbation: Secondary | ICD-10-CM | POA: Diagnosis not present

## 2018-08-17 DIAGNOSIS — Z7982 Long term (current) use of aspirin: Secondary | ICD-10-CM | POA: Insufficient documentation

## 2018-08-17 DIAGNOSIS — Z79899 Other long term (current) drug therapy: Secondary | ICD-10-CM | POA: Diagnosis not present

## 2018-08-17 DIAGNOSIS — N183 Chronic kidney disease, stage 3 (moderate): Secondary | ICD-10-CM | POA: Insufficient documentation

## 2018-08-17 DIAGNOSIS — I129 Hypertensive chronic kidney disease with stage 1 through stage 4 chronic kidney disease, or unspecified chronic kidney disease: Secondary | ICD-10-CM | POA: Insufficient documentation

## 2018-08-17 DIAGNOSIS — Z96652 Presence of left artificial knee joint: Secondary | ICD-10-CM | POA: Diagnosis not present

## 2018-08-17 DIAGNOSIS — E039 Hypothyroidism, unspecified: Secondary | ICD-10-CM | POA: Insufficient documentation

## 2018-08-17 LAB — COMPREHENSIVE METABOLIC PANEL
ALK PHOS: 82 U/L (ref 38–126)
ALT: 16 U/L (ref 0–44)
ANION GAP: 9 (ref 5–15)
AST: 23 U/L (ref 15–41)
Albumin: 3.6 g/dL (ref 3.5–5.0)
BUN: 15 mg/dL (ref 8–23)
CALCIUM: 8.8 mg/dL — AB (ref 8.9–10.3)
CO2: 22 mmol/L (ref 22–32)
Chloride: 108 mmol/L (ref 98–111)
Creatinine, Ser: 1.32 mg/dL — ABNORMAL HIGH (ref 0.44–1.00)
GFR calc non Af Amer: 35 mL/min — ABNORMAL LOW (ref >60)
GFR, EST AFRICAN AMERICAN: 41 mL/min — AB (ref >60)
Glucose, Bld: 143 mg/dL — ABNORMAL HIGH (ref 70–99)
Potassium: 3.5 mmol/L (ref 3.5–5.1)
SODIUM: 139 mmol/L (ref 135–145)
TOTAL PROTEIN: 6.2 g/dL — AB (ref 6.5–8.1)
Total Bilirubin: 0.9 mg/dL (ref 0.3–1.2)

## 2018-08-17 LAB — PROTIME-INR
INR: 0.93
Prothrombin Time: 12.4 seconds (ref 11.4–15.2)

## 2018-08-17 LAB — CBC
HCT: 41.1 % (ref 36.0–46.0)
Hemoglobin: 12.9 g/dL (ref 12.0–15.0)
MCH: 29.5 pg (ref 26.0–34.0)
MCHC: 31.4 g/dL (ref 30.0–36.0)
MCV: 93.8 fL (ref 80.0–100.0)
NRBC: 0 % (ref 0.0–0.2)
PLATELETS: 190 10*3/uL (ref 150–400)
RBC: 4.38 MIL/uL (ref 3.87–5.11)
RDW: 13.2 % (ref 11.5–15.5)
WBC: 7.6 10*3/uL (ref 4.0–10.5)

## 2018-08-17 MED ORDER — PREDNISONE 20 MG PO TABS: 20.0000 mg | ORAL_TABLET | Freq: Every day | ORAL | 0 refills | Status: DC

## 2018-08-17 NOTE — ED Notes (Addendum)
Ambulated pt around nurses station on RA, pt Sp02 levels remained 95% or greater.  Pt slightly tachycardiac at 107.  Overall, pt tolerated well.

## 2018-08-17 NOTE — ED Triage Notes (Signed)
Arrived via EMS from home. Patient developed shortness of breath continued today. Patient took her own albuterol with little to no relief. EMS administered 10mg  albuterol and 0.5 atrovent along with solumedrol 125mg  IVP. States feels better after steroids. Alert answering and following commands appropriate.

## 2018-08-17 NOTE — ED Notes (Addendum)
Discharge instructions (including medications) discussed with and copy provided to patient/caregiver.  Pt and daughter verbalizes understanding of d/c instructions.  Pt armband removed.    Pt unable to sign d/c instructions due to non-working signature pad.

## 2018-08-17 NOTE — ED Provider Notes (Signed)
[facility] EMERGENCY DEPARTMENT Provider Note   CSN: [id number] Arrival date & time: 08/17/18  0941   History   Chief Complaint Chief Complaint  Patient presents with  . Shortness of Breath    HPI [name] is a 85 y.o. female with PMH COPD, HTN, hypothyroidism presenting via EMS with acute SOB that started last night and worsened this morning and persisted when she took her Trelogy and albuterol nebulizer. She was given 125mg  solumedrol with resolution of symptoms and states she is feeling better now. She denies recent cough, sore throat, fever, chills or other illness.  Additionally has been having intermittent nose bleeds where blood "pours out of her nose." They resolve on their own, but have never happened before. She uses a humidifier at home and takes aspirin daily. She does not think she aspirated any blood.  [id number] HPI  Past Medical History:  Diagnosis Date  . Anxiety   . Aortic arch atherosclerosis ([place]) 06/24/2014  . Atherosclerotic ulcer of aorta ([place]) 06/24/2014  . Carotid artery occlusion   . [place] DISEASE, LUMBAR 12/16/2008  . DIVERTICULOSIS, COLON 09/30/2008  . DYSPNEA 07/13/2008  . Graves disease   . History of embolic stroke 1/94/1740   Left brain  . HYPERLIPIDEMIA 03/06/2007  . HYPERTENSION 03/06/2007  . HYPOTHYROIDISM 10/13/2007  . OSTEOARTHRITIS 03/06/2007  . Personal history of colonic polyps 09/30/2008  . Stroke ([place])    06/2014           . TOBACCO USE, QUIT 04/12/2009  . WEAKNESS 11/09/2007    Patient Active Problem List   Diagnosis Date Noted  . Medication management 04/02/2018  . COPD exacerbation ([place]) 03/31/2018  . Hypokalemia 03/30/2018  . COPD with acute exacerbation ([place]) 03/02/2018  . SOB (shortness of breath) 01/12/2018  . CKD (chronic kidney disease), stage III ([place]) 01/12/2018  . New onset left bundle branch block (LBBB) 01/12/2018  . COPD GOLD I D 01/12/2018  . Left buttock pain 09/13/2017  . Symptomatic anemia   .  AVM (arteriovenous malformation) of duodenum, acquired with hemorrhage   . Acute GI bleeding 10/09/2016  . Other emphysema ([place]) 05/10/2016  . Recurrent laryngeal neuropathy 02/01/2016  . Nausea with vomiting 12/11/2015  . Left carotid stenosis 11/21/2015  . Asymptomatic carotid artery stenosis 11/13/2015  . Impaired glucose tolerance 10/27/2015  . Solitary pulmonary nodule 10/27/2015  . Thyroid nodule 10/27/2015  . Syncope 10/04/2015  . Bradycardia with less than 60 beats per minute 10/04/2015  . History of embolic stroke 81/44/8185  . Paresthesia of both feet 07/06/2014  . Aortic arch atherosclerosis ([place]) 06/24/2014  . Atherosclerotic ulcer of aorta ([place]) 06/24/2014  . Stroke ([place]) 06/22/2014  . Bilateral carotid artery disease ([place]) 05/18/2014  . TOBACCO USE, QUIT 04/12/2009  . [place] DISEASE, LUMBAR 12/16/2008  . DIVERTICULOSIS, COLON 09/30/2008  . Personal history of colonic polyps 09/30/2008  . DYSPNEA 07/13/2008  . Weakness 11/09/2007  . Hypothyroidism 10/13/2007  . Hyperlipidemia 03/06/2007  . Essential hypertension 03/06/2007  . Osteoarthritis 03/06/2007    Past Surgical History:  Procedure Laterality Date  . CARDIAC CATHETERIZATION    . CATARACT EXTRACTION Bilateral   . CHOLECYSTECTOMY    . ENDARTERECTOMY Left 11/13/2015   Procedure: LEFT CAROTID ARTERY ENDARTERECTOMY;  Surgeon: Conrad Guthrie, MD;  Location: [facility];  Service: Vascular;  Laterality: Left;  . ESOPHAGOGASTRODUODENOSCOPY (EGD) WITH PROPOFOL N/A 10/11/2016   Procedure: ESOPHAGOGASTRODUODENOSCOPY (EGD) WITH PROPOFOL;  Surgeon: Mauri Pole, MD;  Location: [facility];  Service:  Endoscopy;  Laterality: N/A;  . KNEE SURGERY    . PATCH ANGIOPLASTY Left 11/13/2015   Procedure: WITH 1CM X 6CM  XENOSURE BIOLOGIC PATCH ANGIOPLASTY;  Surgeon: Conrad [place] Hawk, MD;  Location: [place];  Service: Vascular;  Laterality: Left;  . TEE WITHOUT CARDIOVERSION N/A 06/24/2014   Procedure: TRANSESOPHAGEAL ECHOCARDIOGRAM (TEE);   Surgeon: Sanda Klein, MD;  Location: [facility] ENDOSCOPY;  Service: Cardiovascular;  Laterality: N/A;     OB History   No obstetric history on file.      Home Medications    Prior to Admission medications   Medication Sig Start Date End Date Taking? Authorizing Provider  albuterol (PROAIR HFA) 108 (90 Base) MCG/ACT inhaler Inhale 2 puffs into the lungs every 6 (six) hours as needed for wheezing or shortness of breath. 11/13/17  Yes Providence Lanius A, PA-C  albuterol (PROVENTIL) (2.5 MG/3ML) 0.083% nebulizer solution Take 3 mLs (2.5 mg total) by nebulization every 6 (six) hours as needed for wheezing or shortness of breath. 04/02/18  Yes Lauraine Rinne, NP  amLODipine (NORVASC) 10 MG tablet Take 1 tablet (10 mg total) by mouth daily. 02/23/18  Yes Burtis Junes, NP  aspirin EC 81 MG tablet Take 81 mg by mouth daily.   Yes [provider]  atorvastatin (LIPITOR) 80 MG tablet TAKE 1 TABLET BY MOUTH EVERY DAY AT 6PM Patient taking differently: Take 80 mg by mouth daily at 6 PM.  07/24/18  Yes Nafziger, Tommi Rumps, NP  Fluticasone-Umeclidin-Vilant (TRELEGY ELLIPTA) 100-62.5-25 MCG/INH AEPB Inhale 1 puff into the lungs daily.    Yes [provider]  Glycerin-Hypromellose-PEG 400 ([facility] DRY EYE RELIEF) 0.2-0.2-1 % SOLN Place 1 drop into both eyes as needed (dry eyes).    Yes [provider]  hydrALAZINE (APRESOLINE) 25 MG tablet TAKE 3 TABLETS BY MOUTH 3 TIMES A DAY Patient taking differently: Take 75 mg by mouth 3 (three) times daily.  11/17/17  Yes Josue Hector, MD  ipratropium-albuterol (DUONEB) 0.5-2.5 (3) MG/3ML SOLN Take 3 mLs by nebulization every 6 (six) hours as needed. 03/13/18  Yes Martyn Ehrich, NP  levothyroxine (SYNTHROID, LEVOTHROID) 75 MCG tablet Take 1 tablet (75 mcg total) by mouth daily before breakfast. 05/25/18  Yes Nafziger, Tommi Rumps, NP  predniSONE (DELTASONE) 20 MG tablet Take 1 tablet (20 mg total) by mouth daily with breakfast. 08/17/18   Coree Riester, Andris Baumann A, DO    Spacer/Aero-Holding Chambers (AEROCHAMBER MV) inhaler Use as instructed 11/26/17   Magdalen Spatz, NP    Family History Family History  Problem Relation Age of Onset  . Stomach cancer Maternal Grandmother   . Colon cancer Neg Hx     Social History Social History   Tobacco Use  . Smoking status: Former Smoker    Packs/day: 1.50    Years: 29.00    Pack years: 43.50    Types: Cigarettes    Start date: 07/08/1949    Last attempt to quit: 07/08/1978    Years since quitting: 40.1  . Smokeless tobacco: Never Used  Substance Use Topics  . Alcohol use: Yes    Alcohol/week: 1.0 standard drinks    Types: 1 Glasses of wine per week    Comment: "red wine"- some nights  . Drug use: No     Allergies   Catapres [clonidine hcl]; Lisinopril; and Labetalol hcl   Review of Systems Review of Systems  ROS negative except as noted in HPI    Physical Exam Updated Vital Signs BP 129/66  Pulse 84   Temp 97.7 F (36.5 C) (Oral)   Resp 20   Ht 5\' 6"  (1.676 m)   Wt 61.2 kg   SpO2 96%   BMI 21.79 kg/m   Physical Exam Constitution: NAD, supine in bed  HENT: AT/[place], receiving breathing treatment Eyes: eom intact, no icterus or injection  Cardio: RRR, no m/r/g  Respiratory: decreased breath sounds on left, otherwise CTA, no wheezing rhonchi or rales  MSK: moving all extremities, no edema Neuro: a&o, cooperative, pleasant, normal affect  Skin: c/d/i    ED Treatments / Results  Labs (all labs ordered are listed, but only abnormal results are displayed) Labs Reviewed  COMPREHENSIVE METABOLIC PANEL - Abnormal; Notable for the following components:      Result Value   Glucose, Bld 143 (*)    Creatinine, Ser 1.32 (*)    Calcium 8.8 (*)    Total Protein 6.2 (*)    GFR calc non Af Amer 35 (*)    GFR calc Af Amer 41 (*)    All other components within normal limits  PROTIME-INR  CBC    EKG EKG Interpretation  Date/Time:  Monday August 17 2018 09:53:10 EST Ventricular Rate:   76 PR Interval:    QRS Duration: 81 QT Interval:  419 QTC Calculation: 472 R Axis:   79 Text Interpretation:  Sinus rhythm Atrial premature complex Anterior infarct, old Borderline repolarization abnormality When comared to prior, no significant changes seen.  No STEMI Confirmed by Antony Blackbird [id number]) on 08/17/2018 9:54:56 AM   Radiology Dg Chest 2 View  Result Date: 08/17/2018 CLINICAL DATA:  Shortness of breath EXAM: CHEST - 2 VIEW COMPARISON:  03/30/2018 FINDINGS: Borderline heart size. Interstitial distortion and hyperinflation in this patient with emphysema. Chronic nodular density over the left mid lung, seen since at least 2017. Chronic blunting of the lateral left costophrenic sulcus. There is no edema, consolidation, effusion, or pneumothorax. IMPRESSION: 1. No acute finding when compared to priors. 2. COPD. Electronically Signed   By: Monte Fantasia M.D.   On: 08/17/2018 11:21    Procedures Procedures (including critical care time)  Medications Ordered in ED Medications - No data to display   Initial Impression / Assessment and Plan / ED Course  I have reviewed the triage vital signs and the nursing notes.  Pertinent labs & imaging results that were available during my care of the patient were reviewed by me and considered in my medical decision making (see chart for details).  Clinical Course as of Aug 18 817  Mon Aug 17, 2018  1050 Symptoms have improved with albuterol treatment and IV steroids and she is now on RA. She does have decreased breath sounds on the left, will obtain CXR. With her ongoing nose bleeds which have never occurred before will obtain CBC and INR   [JS]  62 85yo female with chronic COPD presenting with SOB and exacerbation. Symptoms relieved with IV solumedrol and breathing treatment. Chest xray shows no acute findings. She is able to ambulate on RA with saturations >95%. She will be discharged with prednisone 40mg  five days. Additionally  provided information for ENT follow-up for intermittent nose bleeds which have been frequent but resolving on their own.    [JS]    Clinical Course User Index [JS] Helmer Dull A, DO    Final Clinical Impressions(s) / ED Diagnoses   Final diagnoses:  COPD exacerbation ([place])  Shortness of breath    ED Discharge Orders  Ordered    predniSONE (DELTASONE) 20 MG tablet  Daily with breakfast     08/17/18 1315           Marsean Elkhatib A, DO 08/18/18 [id number]    Tegeler, Gwenyth Allegra, MD 08/18/18 1740

## 2018-08-17 NOTE — Discharge Instructions (Addendum)
For your COPD, please start:  Prednisone 40 MG two tablets per day for five days   For your nosebleed:  Please contact Dr. Lurlean Leyden with [facility] office to schedule an appointment [address] [place], [place] [address] [phone]

## 2018-08-18 ENCOUNTER — Ambulatory Visit: Payer: Medicare Other | Admitting: Nurse Practitioner

## 2018-08-21 ENCOUNTER — Ambulatory Visit: Payer: Medicare Other | Admitting: Adult Health

## 2018-08-21 ENCOUNTER — Ambulatory Visit: Payer: Medicare Other | Admitting: Pulmonary Disease

## 2018-08-21 ENCOUNTER — Encounter: Payer: Self-pay | Admitting: Pulmonary Disease

## 2018-08-21 VITALS — BP 160/64 | HR 90 | Ht 66.0 in | Wt 141.0 lb

## 2018-08-21 DIAGNOSIS — J42 Unspecified chronic bronchitis: Secondary | ICD-10-CM

## 2018-08-21 DIAGNOSIS — Z23 Encounter for immunization: Secondary | ICD-10-CM

## 2018-08-21 DIAGNOSIS — J449 Chronic obstructive pulmonary disease, unspecified: Secondary | ICD-10-CM | POA: Diagnosis not present

## 2018-08-21 MED ORDER — PREDNISONE 10 MG PO TABS: ORAL_TABLET | ORAL | 0 refills | Status: DC

## 2018-08-21 MED ORDER — PREDNISONE 10 MG PO TABS: 10.0000 mg | ORAL_TABLET | Freq: Every day | ORAL | 3 refills | Status: DC

## 2018-08-21 NOTE — Patient Instructions (Addendum)
I am glad you are doing well with regard to the breathing Continue the Trelegy inhaler Use duo nebs every 6 hours as needed You do not need to use the albuterol nebulizer along with the duo nebs  Regarding your prednisone.  You are currently at 40 mg dose.   Reduce dose by 10 mg every 4 days until you reach a baseline dose of 10 mg.  Then hold prednisone at 10 mg a day till you can be seen again in clinic.  We will call in a prescription for this  Follow-up in 3 months.

## 2018-08-21 NOTE — Progress Notes (Signed)
[name]    [id number]    1925/02/13  Primary Care Physician:Nafziger, Tommi Rumps, NP  Referring Physician: [name], NP [name], [name]  Chief complaint:  Follow up for  Moderate COPD Vocal cord paralysis  HPI: [name] is a 85 year old with complaints of dyspnea on exertion. The problem started in May 2017 after a carotid artery end arterectomy. This procedure was complicated by left vocal cord paralysis, hoarseness. She complains of daily cough with sputum production, wheezing. She denies any fevers, chills, hemoptysis. She had been tried on Advair for 2 months with some improvement in symptoms. She has difficulty clearing secretions. She has has a flutter valve and spirometer at home but is not using this on a regular basis.  She was hospitalized in the emergency room in October 2017 for COPD exacerbation. She was discharged on a prednisone taper and amoxicillin. Records from this hospitalization reviewed below in data section Hospitalized in April 2018 with severe anemia, bleeding AVMs.  Underwent EGD with cauterization, clipping of bleeding lesion. Hospitalized for 2 days in July 2019 for mild COPD exacerbation.  Interim History: She has had multiple exacerbations over the past year. Seen in the ED on 2/10 with wheezing, dyspnea improved with 1 dose of Solu-Medrol and nebs She was discharged on a prednisone taper Continues on Trelegy.   She also saw ENT for nosebleeds and had cauterization done.  Outpatient Encounter Medications as of 08/21/2018  Medication Sig  . albuterol (PROAIR HFA) 108 (90 Base) MCG/ACT inhaler Inhale 2 puffs into the lungs every 6 (six) hours as needed for wheezing or shortness of breath.  [name] Kitchen albuterol (PROVENTIL) (2.5 MG/3ML) 0.083% nebulizer solution Take 3 mLs (2.5 mg total) by nebulization every 6 (six) hours as needed for wheezing or shortness of breath.  [name] Kitchen amLODipine (NORVASC) 10 MG tablet Take 1  tablet (10 mg total) by mouth daily.  [name] Kitchen aspirin EC 81 MG tablet Take 81 mg by mouth daily.  [name] Kitchen atorvastatin (LIPITOR) 80 MG tablet TAKE 1 TABLET BY MOUTH EVERY DAY AT 6PM (Patient taking differently: Take 80 mg by mouth daily at 6 PM. )  . Fluticasone-Umeclidin-Vilant (TRELEGY ELLIPTA) 100-62.5-25 MCG/INH AEPB Inhale 1 puff into the lungs daily.   . Glycerin-Hypromellose-PEG 400 ([employer] DRY EYE RELIEF) 0.2-0.2-1 % SOLN Place 1 drop into both eyes as needed (dry eyes).   . hydrALAZINE (APRESOLINE) 25 MG tablet TAKE 3 TABLETS BY MOUTH 3 TIMES A DAY (Patient taking differently: Take 75 mg by mouth 3 (three) times daily. )  . ipratropium-albuterol (DUONEB) 0.5-2.5 (3) MG/3ML SOLN Take 3 mLs by nebulization every 6 (six) hours as needed.  [name] Kitchen levothyroxine (SYNTHROID, LEVOTHROID) 75 MCG tablet Take 1 tablet (75 mcg total) by mouth daily before breakfast.  . predniSONE (DELTASONE) 20 MG tablet Take 1 tablet (20 mg total) by mouth daily with breakfast.  . Spacer/Aero-Holding Chambers (AEROCHAMBER MV) inhaler Use as instructed   No facility-administered encounter medications on file as of 08/21/2018.    Physical Exam: Blood pressure (!) 160/64, pulse 90, height 5\' 6"  (1.676 m), weight 141 lb (64 kg), SpO2 95 %. Gen:      No acute distress HEENT:  EOMI, sclera anicteric Neck:     No masses; no thyromegaly Lungs:    Clear to auscultation bilaterally; normal respiratory effort CV:         Regular rate and rhythm; no murmurs Abd:      +  bowel sounds; soft, non-tender; no palpable masses, no distension Ext:    No edema; adequate peripheral perfusion Skin:      Warm and dry; no rash Neuro: alert and oriented x 3 Psych: normal mood and affect  Data Reviewed: CT scan chest 11/26/12- Right lingular opacity, scattered sub cm pulmonary nodules, images reviewed.  CT 04/05/16- Stable pulmonary nodules Severe COPD. Chest x-ray 01/12/2018- hyperinflation, no active pulmonary disease.  Stable 7 mm opacity in the left  midlung. Chest x-ray 08/17/2018- hyperinflation, chronic nodular density over left lung. I reviewed the images personally.  PFTs 04/02/16 FVC 2.54 (107%), FEV1 1.43 (82%), F/F 56, TLC 107%, DLCO 42% Mod obstructive lung disease with DLCO impairment.  No bronchodilator response  DATA from [facility], [place] CT of neck 04/09/16- no CT findings to suggest an acute soft tissue abnormality,  subtle asymmetric prominence of right sub-lingual tonsil. Asymmetric prominence of left vocal cord Multiple thyroid lesions Relative ectasia of left carotid bulb Moderate to advanced degenerative arthritis and disc disease  CT of the brain 04/09/16- no CT findings for acute intracranial abnormality. Minimal changes of aging.  Chest x-ray 04/09/16- no acute cardiac pulmonary disease. Emphysema.  EKG 04/09/16- sinus tachycardia, incomplete left bundle branch block, LVH, anterior Q waves  Assessment:  Moderate COPD. Although she has moderate obstruction on PFTs her COPD is likely severe based on lung imaging and DLCO impairment.  Currently on Trelegy inhaler  She has not tolerated coming off prednisone.  I believe she will require a constant dose of 10 mg She is currently at 40 mg/day dose after recent exacerbation.  We will reduce it by 10 mg every 4 days until she reaches a baseline dose of 10 mg/day  We discussed use of Daliresp but decided to hold off as she does not want to deal with possible side effects.  Lung nodule Stable on repeat CT from 2014 to 2017. Likely benign.  Goals of care Initiated discussion about goals of care given frailty, age and recurrent exacerbations She tells me that she wants short-term life support but does not want to be kept on the ventilator for long. I encouraged her to consider DNR status.  She wants to think about it Continue discussions going forward.  Health maintenance 03/13/2018-influenza Give Pneumovax.  Plan/Recommendations: -  Continue Trelegy, duo nebs as needed - Taper prednisone to 10 mg and continue at 10 mg/day - Use incentive spirometer and flutter valve 3 times/ day. - Pneumovax  Marshell Garfinkel MD [facility] 08/21/2018, 9:49 AM  CC: [name], NP

## 2018-08-25 ENCOUNTER — Ambulatory Visit: Payer: Medicare Other | Admitting: Nurse Practitioner

## 2018-08-25 ENCOUNTER — Encounter: Payer: Self-pay | Admitting: Nurse Practitioner

## 2018-08-25 VITALS — BP 160/80 | HR 89 | Ht 66.0 in | Wt 143.0 lb

## 2018-08-25 DIAGNOSIS — R0989 Other specified symptoms and signs involving the circulatory and respiratory systems: Secondary | ICD-10-CM | POA: Diagnosis not present

## 2018-08-25 NOTE — Progress Notes (Signed)
CARDIOLOGY OFFICE NOTE  Date:  08/25/2018    [name] Date of Birth: Jan 31, 1925 Medical Record #[id number]  PCP:  [name], NP  Cardiologist:  Gillian Shields    Chief Complaint  Patient presents with  . Follow-up    6 month check. Seen for Dr. Johnsie Cancel    History of Present Illness: [name] is a 85 y.o. female who presents today for a 6 month check. Seen for Dr. Johnsie Cancel. Has primarily followed with me over the past several years.   She has ahistory of labile HTN, HLD, hypothyroidism, CVA (left parietal in Dec. 2015), carotid artery diease w/high-grade left ICA stenosis with plaque ulceration, and pulmonary nodule.  Shehas beenfollowed by vascular surgery (previously Dr. Bridgett Larsson) and was scheduled for cerebral angiogram. Doppler evaluation suggested a long LICA stenosis >49%. She became concerned about the risk of stroke and asked to have the procedure postponed until after cardiac evaluation. She was seen by Dr. Johnsie Cancel on 10/03/15 for pre operative clearance for possible LCEA.She was started on labetalol and her Norvasc was increased for better blood pressure control.  She took her first does of labetalol the following morning and shortly after began to feel weak, flushed and became very hot and had an episode of syncope. EMS was called and she was admitted 3/29-3/30/17 for work up. Her symptoms gradually improved. She was felt to be very sensitive to the labetalol with hypotension and bradycardia and now it is listed as an allergy. She had a Myoview during her admission on 10/03/15 which was low risk without scar or ischemia, EF normal. She was cleared for vascular surgery. It was felt that she should proceed with surgery as soon as possible. Diuretic therapy was discontinued due to mild renal insufficiency. She was discharged on amlodipine only for BP control.   Subsequentmultiplephone notes revealingthat she was having issues with BPs and trialed on  hydralazine and clonidine. She did not tolerate the clonidine and this was discontinued after an ER visit. Her anxiety has played a big role in her BPP management.   Planwasfor L CEA on May of 2017- looks like this complicated by a vocal cord paralysis and "with possible TE from extensive aortic arch atherosclerosis".She wasreluctant to start anticoagulation. She elected to take ASA and Plavix instead.But then was admitted withprofound anemia in the setting of aspirin and Plavix - transfused - had EGD and noted large AVM that was clipped. She is to stay just on aspirin despite being at risk for embolism from the aortic arch atherosclerosis.  I have seen her several times since - she has tended to be very fixated on past events.  Has done ok from our standpoint. Does not check her BP any more. Last seen by me back in August - she had been admitted the month prior with a COPD exacerbation - had labs, echo and change in inhalers. She was felt to be doing well when I saw her. She has had long standing daily alcohol use.   Comes in today. Here withher daughter.Was in the ER earlier this month with a COPD exacerbation. Has had several exacerbations since our last visit. She is on Prednisone. Saw pulmonary yesterday - noted discussion about DNR - she wanted to think about this. No chest pain. her breathing is her biggest issue. She does try to stay active. She still drives, plays bridge and does her shopping - she cannot bring her groceries in but she is able to  go get them.  Planning a beach trip to "go drink wine". BP fine in the ER. Higher here today. She does not check at home anymore.   Past Medical History:  Diagnosis Date  . Anxiety   . Aortic arch atherosclerosis ([place]) 06/24/2014  . Atherosclerotic ulcer of aorta ([place]) 06/24/2014  . Carotid artery occlusion   . [place] DISEASE, LUMBAR 12/16/2008  . DIVERTICULOSIS, COLON 09/30/2008  . DYSPNEA 07/13/2008  . Graves disease   . History of  embolic stroke 6/50/3546   Left brain  . HYPERLIPIDEMIA 03/06/2007  . HYPERTENSION 03/06/2007  . HYPOTHYROIDISM 10/13/2007  . OSTEOARTHRITIS 03/06/2007  . Personal history of colonic polyps 09/30/2008  . Stroke ([place])    06/2014           . TOBACCO USE, QUIT 04/12/2009  . WEAKNESS 11/09/2007    Past Surgical History:  Procedure Laterality Date  . CARDIAC CATHETERIZATION    . CATARACT EXTRACTION Bilateral   . CHOLECYSTECTOMY    . ENDARTERECTOMY Left 11/13/2015   Procedure: LEFT CAROTID ARTERY ENDARTERECTOMY;  Surgeon: Conrad [place], MD;  Location: [facility];  Service: Vascular;  Laterality: Left;  . ESOPHAGOGASTRODUODENOSCOPY (EGD) WITH PROPOFOL N/A 10/11/2016   Procedure: ESOPHAGOGASTRODUODENOSCOPY (EGD) WITH PROPOFOL;  Surgeon: Mauri Pole, MD;  Location: [facility] ENDOSCOPY;  Service: Endoscopy;  Laterality: N/A;  . KNEE SURGERY    . PATCH ANGIOPLASTY Left 11/13/2015   Procedure: WITH 1CM X 6CM  XENOSURE BIOLOGIC PATCH ANGIOPLASTY;  Surgeon: Conrad [place], MD;  Location: [facility];  Service: Vascular;  Laterality: Left;  . TEE WITHOUT CARDIOVERSION N/A 06/24/2014   Procedure: TRANSESOPHAGEAL ECHOCARDIOGRAM (TEE);  Surgeon: Sanda Klein, MD;  Location[facility] ENDOSCOPY;  Service: Cardiovascular;  Laterality: N/A;     Medications: Current Meds  Medication Sig  . albuterol (PROAIR HFA) 108 (90 Base) MCG/ACT inhaler Inhale 2 puffs into the lungs every 6 (six) hours as needed for wheezing or shortness of breath.  [name] Kitchen amLODipine (NORVASC) 10 MG tablet Take 1 tablet (10 mg total) by mouth daily.  [name] Kitchen aspirin EC 81 MG tablet Take 81 mg by mouth daily.  [name] Kitchen atorvastatin (LIPITOR) 80 MG tablet Take 80 mg by mouth daily.  . Fluticasone-Umeclidin-Vilant (TRELEGY ELLIPTA) 100-62.5-25 MCG/INH AEPB Inhale 1 puff into the lungs daily.   . Glycerin-Hypromellose-PEG 400 ([employer] DRY EYE RELIEF) 0.2-0.2-1 % SOLN Place 1 drop into both eyes as needed (dry eyes).   . hydrALAZINE (APRESOLINE) 25 MG tablet Take 75 mg by mouth 3 (three)  times daily.  [name] Kitchen ipratropium-albuterol (DUONEB) 0.5-2.5 (3) MG/3ML SOLN Take 3 mLs by nebulization every 6 (six) hours as needed.  [name] Kitchen levothyroxine (SYNTHROID, LEVOTHROID) 75 MCG tablet Take 1 tablet (75 mcg total) by mouth daily before breakfast.  . predniSONE (DELTASONE) 10 MG tablet Take 4 tablets x4 days, then 3 tablets x4 days, then 2 tablets x 4 days, then 1 tablet daily.  . predniSONE (DELTASONE) 10 MG tablet Take 1 tablet (10 mg total) by mouth daily with breakfast.  . Spacer/Aero-Holding Chambers (AEROCHAMBER MV) inhaler Use as instructed     Allergies: Allergies  Allergen Reactions  . Catapres [Clonidine Hcl] Other (See Comments)    Made pt feel horrible, shaky, weak and nausea  . Lisinopril Anaphylaxis    Tongue swelling  . Labetalol Hcl Other (See Comments)    Caused bradycardia and syncope    Social History: The patient  reports that she quit smoking about 40 years ago. Her smoking use included cigarettes. She started  smoking about 69 years ago. She has a 43.50 pack-year smoking history. She has never used smokeless tobacco. She reports current alcohol use of about 1.0 standard drinks of alcohol per week. She reports that she does not use drugs.   Family History: The patient's family history includes Stomach cancer in her maternal grandmother.   Review of Systems: Please see the history of present illness.   Otherwise, the review of systems is positive for none.   All other systems are reviewed and negative.   Physical Exam: VS:  BP (!) 160/80 (BP Location: Left Arm, Patient Position: Sitting, Cuff Size: Normal)   Pulse 89   Ht 5\' 6"  (1.676 m)   Wt 143 lb (64.9 kg)   SpO2 96%   BMI 23.08 kg/m  .  BMI Body mass index is 23.08 kg/m.  Wt Readings from Last 3 Encounters:  08/25/18 143 lb (64.9 kg)  08/21/18 141 lb (64 kg)  08/17/18 135 lb (61.2 kg)   BP is 160/60 by me.   General: Pleasant. Elderly. Alert and in no acute distress.  Weight is up from 135 since my  last visit.  HEENT: Normal.  Neck: Supple, no JVD, carotid bruits, or masses noted.  Cardiac: Regular rate and rhythm.Outflow murmur noted. No edema.  Respiratory:  Lungs with decreased breath sounds bilaterally with normal work of breathing.  GI: Soft and nontender.  MS: No deformity or atrophy. Gait and ROM intact.  Skin: Warm and dry. Color is normal.  Neuro:  Strength and sensation are intact and no gross focal deficits noted.  Psych: Alert, appropriate and with normal affect.   LABORATORY DATA:  EKG:  EKG is not ordered today.  Lab Results  Component Value Date   WBC 7.6 08/17/2018   HGB 12.9 08/17/2018   HCT 41.1 08/17/2018   PLT 190 08/17/2018   GLUCOSE 143 (H) 08/17/2018   CHOL 148 01/13/2018   TRIG 31 01/13/2018   HDL 76 01/13/2018   LDLDIRECT 141.7 05/01/2011   LDLCALC 66 01/13/2018   ALT 16 08/17/2018   AST 23 08/17/2018   NA 139 08/17/2018   K 3.5 08/17/2018   CL 108 08/17/2018   CREATININE 1.32 (H) 08/17/2018   BUN 15 08/17/2018   CO2 22 08/17/2018   TSH 0.616 03/02/2018   INR 0.93 08/17/2018   HGBA1C 5.7 (H) 01/13/2018     BNP (last 3 results) Recent Labs    11/12/17 1825 01/13/18 0021 03/02/18 0924  BNP 111.9* 105.0* 81.9    ProBNP (last 3 results) No results for input(s): PROBNP in the last 8760 hours.   Other Studies Reviewed Today:  Echo Study Conclusions 01/2018  - Left ventricle: The cavity size was normal. There was moderate concentric hypertrophy. Systolic function was normal. The estimated ejection fraction was in the range of 55% to 60%. Wall motion was normal; there were no regional wall motion abnormalities. - Aortic valve: Valve mobility was mildly restricted. There was mild stenosis. There was trivial regurgitation. Valve area (VTI): 1.53 cm^2. Valve area (Vmean): 1.42 cm^2. - Atrial septum: There was increased thickness of the septum, consistent with lipomatous hypertrophy.  Impressions:  - Normal LV  ejection fraction, mild aortic stenosis.   Nm Myocar Multi W/spect W/wall Motion / Ef 10/05/2015   There was no ST segment deviation noted during stress.  Defect 1: There is a medium defect of mild severity present in the mid anteroseptal, mid inferoseptal and apical septal location.  This is a low  risk study.  The study is normal.  The left ventricular ejection fraction is normal (55-65%).  Nuclear stress EF: 57%. Low risk stress nuclear study with LBBB-related perfusion artifact, otherwise normal perfusion and normal left ventricular regional and global systolic function.   CTA Neck (10/09/15) Advanced atherosclerotic plaque of the thoracic aorta and distal aortic arch.  50% diameter stenosis proximal right internal carotid artery  Irregular plaque of the distal left common carotid artery with plaque ulceration. Very irregular plaque involving the proximal left internal carotid artery. Critical 85% stenosis of the proximal left internal carotid artery  Mild atherosclerotic disease in the right vertebral artery without significant vertebral artery stenosis.  Heterogeneous thyroid bilaterally with 11 mm right lower pole nodule. Recommend thyroid ultrasound.    Assessment/Plan:  1.Labile HTN: her BP was 093 systolic earlier this month. Little high here today but not dangerous. She is not interested in further medicines for her BP or for checking BP at home. I think overall she is doing ok. No changes made today.   2.Carotid artery diease w/high-grade left ICA stenosis with plaque ulceration:s/p CEA last [id number] whichwas complicated by a left vocal cord paralysis. Noted severe atherosclerotic burden in the arch with concerns for possible embolism.She is followed by [facility].  3.HLD:on statin therapy  4. COPD/Pulmonary nodule(noted to be likely benign): she has had several exacerbations over the past 6 months. She is followed by pulmonary.    5.Hypothyroidism: per PCP  6.Previous CVA: Continue aspirin and statin.  7.Anxiety:always an issue - this seems better today.   8. Swelling- not really endorsed today. She most certainly gets too much salt - it will go down overnight. No changes made today.  29. Advanced age - doing quite well overall.   10 Daily alcohol use - I do not see this changing.   Current medicines are reviewed with the patient today.  The patient does not have concerns regarding medicines other than what has been noted above.  The following changes have been made:  See above.  Labs/ tests ordered today include:   No orders of the defined types were placed in this encounter.    Disposition:   FU with me in 6 months.   Patient is agreeable to this plan and will call if any problems develop in the interim.   SignedTruitt Merle, NP  08/25/2018 2:51 PM  [facility] [address] [place],   [address] Phone: [phone] Fax: [phone]

## 2018-08-25 NOTE — Patient Instructions (Addendum)
We will be checking the following labs today - NONE    Medication Instructions:    Continue with your current medicines.    If you need a refill on your cardiac medications before your next appointment, please call your pharmacy.     Testing/Procedures To Be Arranged:  N/A  Follow-Up:   See me in 6 months    At [facility], you and your health needs are our priority.  As part of our continuing mission to provide you with exceptional heart care, we have created designated Provider Care Teams.  These Care Teams include your primary Cardiologist (physician) and Advanced Practice Providers (APPs -  Physician Assistants and Nurse Practitioners) who all work together to provide you with the care you need, when you need it.  Special Instructions:  . None  Call the [facility] office at [phone] if you have any questions, problems or concerns.       

## 2018-08-30 ENCOUNTER — Other Ambulatory Visit: Payer: Self-pay | Admitting: Cardiovascular Disease

## 2018-09-02 ENCOUNTER — Other Ambulatory Visit: Payer: Self-pay

## 2018-09-02 ENCOUNTER — Emergency Department (HOSPITAL_COMMUNITY)
Admission: EM | Admit: 2018-09-02 | Discharge: 2018-09-02 | Disposition: A | Discharge status: Home / Self Care | Payer: Medicare Other | Attending: Emergency Medicine | Admitting: Emergency Medicine

## 2018-09-02 ENCOUNTER — Emergency Department (HOSPITAL_COMMUNITY): Payer: Medicare Other

## 2018-09-02 ENCOUNTER — Encounter (HOSPITAL_COMMUNITY): Payer: Self-pay

## 2018-09-02 DIAGNOSIS — E039 Hypothyroidism, unspecified: Secondary | ICD-10-CM | POA: Diagnosis not present

## 2018-09-02 DIAGNOSIS — N183 Chronic kidney disease, stage 3 (moderate): Secondary | ICD-10-CM | POA: Insufficient documentation

## 2018-09-02 DIAGNOSIS — Z79899 Other long term (current) drug therapy: Secondary | ICD-10-CM | POA: Insufficient documentation

## 2018-09-02 DIAGNOSIS — R1032 Left lower quadrant pain: Secondary | ICD-10-CM | POA: Diagnosis not present

## 2018-09-02 DIAGNOSIS — I2510 Atherosclerotic heart disease of native coronary artery without angina pectoris: Secondary | ICD-10-CM | POA: Diagnosis not present

## 2018-09-02 DIAGNOSIS — Z9049 Acquired absence of other specified parts of digestive tract: Secondary | ICD-10-CM | POA: Diagnosis not present

## 2018-09-02 DIAGNOSIS — F419 Anxiety disorder, unspecified: Secondary | ICD-10-CM | POA: Diagnosis not present

## 2018-09-02 DIAGNOSIS — J449 Chronic obstructive pulmonary disease, unspecified: Secondary | ICD-10-CM | POA: Diagnosis not present

## 2018-09-02 DIAGNOSIS — Z87891 Personal history of nicotine dependence: Secondary | ICD-10-CM | POA: Insufficient documentation

## 2018-09-02 DIAGNOSIS — Z8673 Personal history of transient ischemic attack (TIA), and cerebral infarction without residual deficits: Secondary | ICD-10-CM | POA: Diagnosis not present

## 2018-09-02 DIAGNOSIS — Z7982 Long term (current) use of aspirin: Secondary | ICD-10-CM | POA: Insufficient documentation

## 2018-09-02 DIAGNOSIS — M7918 Myalgia, other site: Secondary | ICD-10-CM

## 2018-09-02 DIAGNOSIS — I129 Hypertensive chronic kidney disease with stage 1 through stage 4 chronic kidney disease, or unspecified chronic kidney disease: Secondary | ICD-10-CM | POA: Insufficient documentation

## 2018-09-02 DIAGNOSIS — R062 Wheezing: Secondary | ICD-10-CM | POA: Insufficient documentation

## 2018-09-02 DIAGNOSIS — R109 Unspecified abdominal pain: Secondary | ICD-10-CM

## 2018-09-02 LAB — COMPREHENSIVE METABOLIC PANEL
ALT: 47 U/L — ABNORMAL HIGH (ref 0–44)
AST: 29 U/L (ref 15–41)
Albumin: 4.2 g/dL (ref 3.5–5.0)
Alkaline Phosphatase: 96 U/L (ref 38–126)
Anion gap: 10 (ref 5–15)
BUN: 21 mg/dL (ref 8–23)
CHLORIDE: 101 mmol/L (ref 98–111)
CO2: 23 mmol/L (ref 22–32)
Calcium: 8.5 mg/dL — ABNORMAL LOW (ref 8.9–10.3)
Creatinine, Ser: 1.2 mg/dL — ABNORMAL HIGH (ref 0.44–1.00)
GFR calc Af Amer: 46 mL/min — ABNORMAL LOW (ref >60)
GFR calc non Af Amer: 39 mL/min — ABNORMAL LOW (ref >60)
Glucose, Bld: 253 mg/dL — ABNORMAL HIGH (ref 70–99)
Potassium: 3.9 mmol/L (ref 3.5–5.1)
Sodium: 134 mmol/L — ABNORMAL LOW (ref 135–145)
Total Bilirubin: 0.9 mg/dL (ref 0.3–1.2)
Total Protein: 6.7 g/dL (ref 6.5–8.1)

## 2018-09-02 LAB — URINALYSIS, ROUTINE W REFLEX MICROSCOPIC
Bilirubin Urine: NEGATIVE
Glucose, UA: 50 mg/dL — AB
Hgb urine dipstick: NEGATIVE
Ketones, ur: NEGATIVE mg/dL
LEUKOCYTE UA: NEGATIVE
Nitrite: NEGATIVE
Protein, ur: NEGATIVE mg/dL
Specific Gravity, Urine: 1.012 (ref 1.005–1.030)
pH: 7 (ref 5.0–8.0)

## 2018-09-02 LAB — CBC WITH DIFFERENTIAL/PLATELET
Abs Immature Granulocytes: 0.05 10*3/uL (ref 0.00–0.07)
Basophils Absolute: 0 10*3/uL (ref 0.0–0.1)
Basophils Relative: 0 %
Eosinophils Absolute: 0 10*3/uL (ref 0.0–0.5)
Eosinophils Relative: 0 %
HCT: 44.5 % (ref 36.0–46.0)
Hemoglobin: 14.4 g/dL (ref 12.0–15.0)
Immature Granulocytes: 1 %
Lymphocytes Relative: 3 %
Lymphs Abs: 0.2 10*3/uL — ABNORMAL LOW (ref 0.7–4.0)
MCH: 30.6 pg (ref 26.0–34.0)
MCHC: 32.4 g/dL (ref 30.0–36.0)
MCV: 94.7 fL (ref 80.0–100.0)
Monocytes Absolute: 0.1 10*3/uL (ref 0.1–1.0)
Monocytes Relative: 2 %
NEUTROS PCT: 94 %
Neutro Abs: 7.5 10*3/uL (ref 1.7–7.7)
Platelets: 181 10*3/uL (ref 150–400)
RBC: 4.7 MIL/uL (ref 3.87–5.11)
RDW: 13.3 % (ref 11.5–15.5)
WBC: 7.9 10*3/uL (ref 4.0–10.5)
nRBC: 0 % (ref 0.0–0.2)

## 2018-09-02 MED ORDER — DOXYCYCLINE HYCLATE 100 MG PO CAPS: 100.0000 mg | ORAL_CAPSULE | Freq: Two times a day (BID) | ORAL | 0 refills | Status: DC

## 2018-09-02 MED ORDER — LIDOCAINE 4 % EX PTCH: 1.0000 | MEDICATED_PATCH | Freq: Two times a day (BID) | CUTANEOUS | 0 refills | Status: DC

## 2018-09-02 MED ORDER — FENTANYL CITRATE (PF) 100 MCG/2ML IJ SOLN: 50.0000 ug | INTRAMUSCULAR | Status: DC | PRN

## 2018-09-02 MED ORDER — IOPAMIDOL (ISOVUE-300) INJECTION 61%
INTRAVENOUS | Status: AC
  Filled 2018-09-02: qty 100

## 2018-09-02 MED ORDER — METHOCARBAMOL 500 MG PO TABS: 500.0000 mg | ORAL_TABLET | Freq: Two times a day (BID) | ORAL | 0 refills | Status: DC | PRN

## 2018-09-02 MED ORDER — ONDANSETRON HCL 4 MG/2ML IJ SOLN
4.0000 mg | Freq: Once | INTRAMUSCULAR | Status: AC
  Administered 2018-09-02: 4 mg via INTRAVENOUS
  Filled 2018-09-02: qty 2

## 2018-09-02 MED ORDER — IOHEXOL 300 MG/ML  SOLN
30.0000 mL | Freq: Once | INTRAMUSCULAR | Status: AC | PRN
  Administered 2018-09-02: 30 mL via ORAL

## 2018-09-02 MED ORDER — LIDOCAINE 5 % EX PTCH
1.0000 | MEDICATED_PATCH | CUTANEOUS | Status: DC
  Administered 2018-09-02: 1 via TRANSDERMAL
  Filled 2018-09-02: qty 1

## 2018-09-02 MED ORDER — SODIUM CHLORIDE (PF) 0.9 % IJ SOLN
INTRAMUSCULAR | Status: AC
  Filled 2018-09-02: qty 50

## 2018-09-02 MED ORDER — ACETAMINOPHEN ER 650 MG PO TBCR: 650.0000 mg | EXTENDED_RELEASE_TABLET | Freq: Three times a day (TID) | ORAL | 0 refills | Status: DC | PRN

## 2018-09-02 MED ORDER — ONDANSETRON HCL 4 MG/2ML IJ SOLN: 4.0000 mg | Freq: Four times a day (QID) | INTRAMUSCULAR | Status: DC | PRN

## 2018-09-02 MED ORDER — SODIUM CHLORIDE 0.9 % IV BOLUS
500.0000 mL | Freq: Once | INTRAVENOUS | Status: AC
  Administered 2018-09-02: 500 mL via INTRAVENOUS

## 2018-09-02 MED ORDER — ACETAMINOPHEN 325 MG PO TABS
650.0000 mg | ORAL_TABLET | Freq: Once | ORAL | Status: AC
  Administered 2018-09-02: 650 mg via ORAL
  Filled 2018-09-02: qty 2

## 2018-09-02 MED ORDER — FENTANYL CITRATE (PF) 100 MCG/2ML IJ SOLN
50.0000 ug | Freq: Once | INTRAMUSCULAR | Status: AC
  Administered 2018-09-02: 50 ug via INTRAVENOUS
  Filled 2018-09-02: qty 2

## 2018-09-02 MED ORDER — METHOCARBAMOL 500 MG PO TABS: 500.0000 mg | ORAL_TABLET | Freq: Once | ORAL | 0 refills | Status: DC | PRN

## 2018-09-02 MED ORDER — IOPAMIDOL (ISOVUE-300) INJECTION 61%
100.0000 mL | Freq: Once | INTRAVENOUS | Status: AC | PRN
  Administered 2018-09-02: 80 mL via INTRAVENOUS

## 2018-09-02 NOTE — ED Triage Notes (Signed)
Per EMS, Pt has sudden onset of lower left back pain. Started today at apprx 1300. Similar episode yesterday, went away after drinking propel. Did not go away today, no HTX of kidney stones.

## 2018-09-02 NOTE — Discharge Instructions (Addendum)
We saw in the ER for left-sided pain. The x-rays showing some haziness on the left side for which were sending you home with antibiotics.  Make sure you are taking deep breaths every few minutes to keep your lungs open.  Additionally the CT scan of the abdomen did not reveal any abnormalities.  There is no bleeding, infection, blockage appreciated on the CAT scan.  We suspect that the flank pain could be because of musculoskeletal pain, for which we are sending you home with lidocaine patch, Tylenol, Robaxin.  The muscle relaxant can make you drowsy so be very careful with it.  We recommend that you exercise extreme caution to prevent any accidents like fall.  Return to the ER immediately if you are feeling worse.

## 2018-09-02 NOTE — ED Provider Notes (Signed)
[facility] DEPT Provider Note   CSN: [id number] Arrival date & time: 09/02/18  1547    History   Chief Complaint Chief Complaint  Patient presents with  . Flank Pain    HPI [name] is a 85 y.o. female.     HPI 85 year old female with history of hyperlipidemia, hypertension, atherosclerosis and emphysema comes in with chief complaint of flank pain.  Patient states that she started having left-sided flank pain yesterday.  She thought she had a kidney stone and drink extra amount of fluids, and her pain resolved.  She slept well all night and was doing well in the morning, however around 1 PM she started having severe pain in the left flank region.  The pain is now constant and has progressed in intensity.  She denies any recent traumas or overexertion that might have caused pain.  She feels like the pain is internal.  Review of system is negative for any nausea, vomiting, UTI-like symptoms, vaginal discharge, vaginal bleeding.  She also denies any history of previous stones or similar pain.  Past Medical History:  Diagnosis Date  . Anxiety   . Aortic arch atherosclerosis ([place]) 06/24/2014  . Atherosclerotic ulcer of aorta ([place]) 06/24/2014  . Carotid artery occlusion   . [place] DISEASE, LUMBAR 12/16/2008  . DIVERTICULOSIS, COLON 09/30/2008  . DYSPNEA 07/13/2008  . Graves disease   . History of embolic stroke 5/88/5027   Left brain  . HYPERLIPIDEMIA 03/06/2007  . HYPERTENSION 03/06/2007  . HYPOTHYROIDISM 10/13/2007  . OSTEOARTHRITIS 03/06/2007  . Personal history of colonic polyps 09/30/2008  . Stroke ([place])    06/2014           . TOBACCO USE, QUIT 04/12/2009  . WEAKNESS 11/09/2007    Patient Active Problem List   Diagnosis Date Noted  . Medication management 04/02/2018  . COPD exacerbation ([place]) 03/31/2018  . Hypokalemia 03/30/2018  . COPD with acute exacerbation ([place]) 03/02/2018  . SOB (shortness of breath) 01/12/2018  . CKD (chronic kidney  disease), stage III ([place]) 01/12/2018  . New onset left bundle branch block (LBBB) 01/12/2018  . COPD GOLD I D 01/12/2018  . Left buttock pain 09/13/2017  . Symptomatic anemia   . AVM (arteriovenous malformation) of duodenum, acquired with hemorrhage   . Acute GI bleeding 10/09/2016  . Other emphysema ([place]) 05/10/2016  . Recurrent laryngeal neuropathy 02/01/2016  . Nausea with vomiting 12/11/2015  . Left carotid stenosis 11/21/2015  . Asymptomatic carotid artery stenosis 11/13/2015  . Impaired glucose tolerance 10/27/2015  . Solitary pulmonary nodule 10/27/2015  . Thyroid nodule 10/27/2015  . Syncope 10/04/2015  . Bradycardia with less than 60 beats per minute 10/04/2015  . History of embolic stroke 74/06/8785  . Paresthesia of both feet 07/06/2014  . Aortic arch atherosclerosis ([place]) 06/24/2014  . Atherosclerotic ulcer of aorta ([place]) 06/24/2014  . Stroke ([place]) 06/22/2014  . Bilateral carotid artery disease ([place]) 05/18/2014  . TOBACCO USE, QUIT 04/12/2009  . [place] DISEASE, LUMBAR 12/16/2008  . DIVERTICULOSIS, COLON 09/30/2008  . Personal history of colonic polyps 09/30/2008  . DYSPNEA 07/13/2008  . Weakness 11/09/2007  . Hypothyroidism 10/13/2007  . Hyperlipidemia 03/06/2007  . Essential hypertension 03/06/2007  . Osteoarthritis 03/06/2007    Past Surgical History:  Procedure Laterality Date  . CARDIAC CATHETERIZATION    . CATARACT EXTRACTION Bilateral   . CHOLECYSTECTOMY    . ENDARTERECTOMY Left 11/13/2015   Procedure: LEFT CAROTID ARTERY ENDARTERECTOMY;  Surgeon: Conrad Sheldon, MD;  Location: [facility] OR;  Service: Vascular;  Laterality: Left;  . ESOPHAGOGASTRODUODENOSCOPY (EGD) WITH PROPOFOL N/A 10/11/2016   Procedure: ESOPHAGOGASTRODUODENOSCOPY (EGD) WITH PROPOFOL;  Surgeon: Mauri Pole, MD;  Location: [facility] ENDOSCOPY;  Service: Endoscopy;  Laterality: N/A;  . KNEE SURGERY    . PATCH ANGIOPLASTY Left 11/13/2015   Procedure: WITH 1CM X 6CM  XENOSURE BIOLOGIC PATCH ANGIOPLASTY;   Surgeon: Conrad Interlochen, MD;  Location: [facility];  Service: Vascular;  Laterality: Left;  . TEE WITHOUT CARDIOVERSION N/A 06/24/2014   Procedure: TRANSESOPHAGEAL ECHOCARDIOGRAM (TEE);  Surgeon: Sanda Klein, MD;  Location: [facility] ENDOSCOPY;  Service: Cardiovascular;  Laterality: N/A;     OB History   No obstetric history on file.      Home Medications    Prior to Admission medications   Medication Sig Start Date End Date Taking? Authorizing Provider  albuterol (PROAIR HFA) 108 (90 Base) MCG/ACT inhaler Inhale 2 puffs into the lungs every 6 (six) hours as needed for wheezing or shortness of breath. 11/13/17   Volanda Napoleon, PA-C  amLODipine (NORVASC) 10 MG tablet Take 1 tablet (10 mg total) by mouth daily. 02/23/18   Burtis Junes, NP  aspirin EC 81 MG tablet Take 81 mg by mouth daily.    [provider]  atorvastatin (LIPITOR) 80 MG tablet Take 80 mg by mouth daily.    [provider]  Fluticasone-Umeclidin-Vilant (TRELEGY ELLIPTA) 100-62.5-25 MCG/INH AEPB Inhale 1 puff into the lungs daily.     [provider]  Glycerin-Hypromellose-PEG 400 ([employer] DRY EYE RELIEF) 0.2-0.2-1 % SOLN Place 1 drop into both eyes as needed (dry eyes).     [provider]  hydrALAZINE (APRESOLINE) 25 MG tablet TAKE 3 TABLETS BY MOUTH 3 TIMES A DAY 09/01/18   Josue Hector, MD  ipratropium-albuterol (DUONEB) 0.5-2.5 (3) MG/3ML SOLN Take 3 mLs by nebulization every 6 (six) hours as needed. 03/13/18   Martyn Ehrich, NP  levothyroxine (SYNTHROID, LEVOTHROID) 75 MCG tablet Take 1 tablet (75 mcg total) by mouth daily before breakfast. 05/25/18   Nafziger, Tommi Rumps, NP  predniSONE (DELTASONE) 10 MG tablet Take 4 tablets x4 days, then 3 tablets x4 days, then 2 tablets x 4 days, then 1 tablet daily. 08/21/18   Mannam, Hart Robinsons, MD  predniSONE (DELTASONE) 10 MG tablet Take 1 tablet (10 mg total) by mouth daily with breakfast. 08/21/18   Marshell Garfinkel, MD  Spacer/Aero-Holding Chambers  (AEROCHAMBER MV) inhaler Use as instructed 11/26/17   Magdalen Spatz, NP    Family History Family History  Problem Relation Age of Onset  . Stomach cancer Maternal Grandmother   . Colon cancer Neg Hx     Social History Social History   Tobacco Use  . Smoking status: Former Smoker    Packs/day: 1.50    Years: 29.00    Pack years: 43.50    Types: Cigarettes    Start date: 07/08/1949    Last attempt to quit: 07/08/1978    Years since quitting: 40.1  . Smokeless tobacco: Never Used  Substance Use Topics  . Alcohol use: Yes    Alcohol/week: 1.0 standard drinks    Types: 1 Glasses of wine per week    Comment: "red wine"- some nights  . Drug use: No     Allergies   Catapres [clonidine hcl]; Lisinopril; and Labetalol hcl   Review of Systems Review of Systems  Constitutional: Positive for activity change.  Respiratory: Negative for shortness of breath.   Cardiovascular: Negative  for chest pain.  Gastrointestinal: Negative for nausea and vomiting.  Genitourinary: Positive for frequency.  Allergic/Immunologic: Negative for immunocompromised state.  Hematological: Does not bruise/bleed easily.  All other systems reviewed and are negative.    Physical Exam Updated Vital Signs BP (!) 181/88   Pulse 74   Temp (!) 97.5 F (36.4 C) (Oral)   Resp (!) 26   Ht 5\' 6"  (1.676 m)   Wt 65.8 kg   SpO2 96%   BMI 23.40 kg/m   Physical Exam Vitals signs and nursing note reviewed.  Constitutional:      Appearance: She is well-developed.  HENT:     Head: Normocephalic and atraumatic.  Neck:     Musculoskeletal: Normal range of motion and neck supple.  Cardiovascular:     Rate and Rhythm: Normal rate.  Pulmonary:     Effort: Pulmonary effort is normal.     Breath sounds: Wheezing present. No rhonchi.     Comments: Poor aeration.  Mild tachypnea. Abdominal:     General: Bowel sounds are normal.     Tenderness: There is abdominal tenderness.     Comments: Left lower quadrant  tenderness with guarding.  Genitourinary:    Comments: Left flank tenderness. Skin:    General: Skin is warm and dry.  Neurological:     Mental Status: She is alert and oriented to person, place, and time.      ED Treatments / Results  Labs (all labs ordered are listed, but only abnormal results are displayed) Labs Reviewed  COMPREHENSIVE METABOLIC PANEL  CBC WITH DIFFERENTIAL/PLATELET  URINALYSIS, ROUTINE W REFLEX MICROSCOPIC    EKG None  Radiology No results found.  Procedures Procedures (including critical care time)  Medications Ordered in ED Medications  fentaNYL (SUBLIMAZE) injection 50 mcg (has no administration in time range)  ondansetron (ZOFRAN) injection 4 mg (has no administration in time range)  sodium chloride 0.9 % bolus 500 mL (has no administration in time range)  iohexol (OMNIPAQUE) 300 MG/ML solution 30 mL (30 mLs Oral Contrast Given 09/02/18 1634)     Initial Impression / Assessment and Plan / ED Course  I have reviewed the triage vital signs and the nursing notes.  Pertinent labs & imaging results that were available during my care of the patient were reviewed by me and considered in my medical decision making (see chart for details).        86 year old female with history of emphysema, atherosclerosis, hypertension and hyperlipidemia comes in with chief complaint of left-sided flank pain.  It appears that her pain was sudden in onset at 1 PM today and has progressed over time.  The pain is fairly constant.  She is also noted to have left lower quadrant abdominal tenderness on exam with guarding.  Initial differential diagnosis includes diverticulitis, perforated viscus, renal stone, renal infarct.  Other vascular conditions like aortic dissection, AAA also considered in the differential, however deemed to be less likely than the earlier mentioned conditions.  Plan is to get basic labs and a CT scan abdomen and pelvis with IV contrast.  Final  Clinical Impressions(s) / ED Diagnoses   Final diagnoses:  None    ED Discharge Orders    None       Varney Biles, MD 09/02/18 [phone]

## 2018-09-02 NOTE — ED Notes (Signed)
Pt. Aware of urine specimen. Will collect urine when  Pt. Voids. Nurse aware.

## 2018-09-02 NOTE — ED Notes (Signed)
Bed: WA15 Expected date:  Expected time:  Means of arrival:  Comments: EMS-back pain 

## 2018-09-05 ENCOUNTER — Ambulatory Visit: Payer: Medicare Other | Admitting: Family Medicine

## 2018-09-05 ENCOUNTER — Encounter: Payer: Self-pay | Admitting: Family Medicine

## 2018-09-05 VITALS — BP 158/96 | HR 76 | Temp 97.7°F | Ht 66.0 in | Wt 145.0 lb

## 2018-09-05 DIAGNOSIS — M5489 Other dorsalgia: Secondary | ICD-10-CM

## 2018-09-05 MED ORDER — METHYLPREDNISOLONE ACETATE 40 MG/ML IJ SUSP
40.0000 mg | Freq: Once | INTRAMUSCULAR | Status: AC
  Administered 2018-09-05: 40 mg via INTRAMUSCULAR

## 2018-09-05 MED ORDER — ACETAMINOPHEN-CODEINE #3 300-30 MG PO TABS: 1.0000 | ORAL_TABLET | Freq: Three times a day (TID) | ORAL | 0 refills | Status: DC | PRN

## 2018-09-05 MED ORDER — METHOCARBAMOL 500 MG PO TABS: 500.0000 mg | ORAL_TABLET | Freq: Every day | ORAL | 0 refills | Status: DC | PRN

## 2018-09-05 NOTE — Progress Notes (Signed)
Subjective:    Patient ID: [name], female    DOB: 1924/08/30, 85 y.o.   MRN: [id number]  HPI   Patient presents to clinic due to continued left low back pain.  Patient has had this back pain since 09/02/2018.  Patient did go to the emergency room for evaluation of the pain due to it being severe.  Extensive work-up done in emergency room including blood work, CT scan, chest x-ray due to suspected possible kidney stone or aortic dissection.  Blood work and imaging done in emergency department reviewed by me.  Kidney stone ruled out, aortic dissection ruled out.  Pain suspected to be musculoskeletal in nature.  Patient was prescribed Robaxin to use as needed for muscle relaxer and advised to use lidocaine patches. Also advised to use tylenol PRN pain.   Patient states she continues to have pain and is upset that is not going away.  Daughter asks why low back x-ray was not done in the emergency room.    Patient denies numbness or tingling in lower extremities.  Denies saddle anesthesia.  Denies fever or chills.  Patient is currently finishing a prednisone taper that was begun on 08/21/2018  CBC Latest Ref Rng & Units 09/02/2018 08/17/2018 03/31/2018  WBC 4.0 - 10.5 K/uL 7.9 7.6 3.2(L)  Hemoglobin 12.0 - 15.0 g/dL 14.4 12.9 11.9(L)  Hematocrit 36.0 - 46.0 % 44.5 41.1 35.9(L)  Platelets 150 - 400 K/uL 181 190 222   CMP Latest Ref Rng & Units 09/02/2018 08/17/2018 03/31/2018  Glucose 70 - 99 mg/dL 253(H) 143(H) 235(H)  BUN 8 - 23 mg/dL 21 15 22   Creatinine 0.44 - 1.00 mg/dL 1.20(H) 1.32(H) 1.40(H)  Sodium 135 - 145 mmol/L 134(L) 139 136  Potassium 3.5 - 5.1 mmol/L 3.9 3.5 3.7  Chloride 98 - 111 mmol/L 101 108 102  CO2 22 - 32 mmol/L 23 22 23   Calcium 8.9 - 10.3 mg/dL 8.5(L) 8.8(L) 9.1  Total Protein 6.5 - 8.1 g/dL 6.7 6.2(L) -  Total Bilirubin 0.3 - 1.2 mg/dL 0.9 0.9 -  Alkaline Phos 38 - 126 U/L 96 82 -  AST 15 - 41 U/L 29 23 -  ALT 0 - 44 U/L 47(H) 16 -   CT ABD/pelvis  09/02/2018 done in ER:  IMPRESSION: 1. No acute findings in the abdomen or pelvis. Specifically, no findings to explain the patient's history of left flank pain. No evidence for diverticulitis. No left adnexal mass. 2. Bibasilar dependent atelectasis or infiltrate with small left and tiny right pleural effusions. 3. Bilateral renal cysts. 4. 3.3 cm abdominal aortic aneurysm. Recommend followup by ultrasound in 3 years. This recommendation follows ACR consensus guidelines: White Paper of the ACR Incidental Findings Committee II on Vascular Findings. J Am Coll Radiol 2013; 44:034-742 5.  Aortic Atherosclerois (ICD10-170.0)   Two chest x-rays done in the month of February 2020 reviewed.   .  Patient Active Problem List   Diagnosis Date Noted  . Medication management 04/02/2018  . COPD exacerbation ([place]) 03/31/2018  . Hypokalemia 03/30/2018  . COPD with acute exacerbation ([place]) 03/02/2018  . SOB (shortness of breath) 01/12/2018  . CKD (chronic kidney disease), stage III ([place]) 01/12/2018  . New onset left bundle branch block (LBBB) 01/12/2018  . COPD GOLD I D 01/12/2018  . Left buttock pain 09/13/2017  . Symptomatic anemia   . AVM (arteriovenous malformation) of duodenum, acquired with hemorrhage   . Acute GI bleeding 10/09/2016  . Other emphysema ([place]) 05/10/2016  .  Recurrent laryngeal neuropathy 02/01/2016  . Nausea with vomiting 12/11/2015  . Left carotid stenosis 11/21/2015  . Asymptomatic carotid artery stenosis 11/13/2015  . Impaired glucose tolerance 10/27/2015  . Solitary pulmonary nodule 10/27/2015  . Thyroid nodule 10/27/2015  . Syncope 10/04/2015  . Bradycardia with less than 60 beats per minute 10/04/2015  . History of embolic stroke 44/96/7591  . Paresthesia of both feet 07/06/2014  . Aortic arch atherosclerosis ([place]) 06/24/2014  . Atherosclerotic ulcer of aorta ([place]) 06/24/2014  . Stroke ([place]) 06/22/2014  . Bilateral carotid artery disease ([place]) 05/18/2014  .  TOBACCO USE, QUIT 04/12/2009  . [place] DISEASE, LUMBAR 12/16/2008  . DIVERTICULOSIS, COLON 09/30/2008  . Personal history of colonic polyps 09/30/2008  . DYSPNEA 07/13/2008  . Weakness 11/09/2007  . Hypothyroidism 10/13/2007  . Hyperlipidemia 03/06/2007  . Essential hypertension 03/06/2007  . Osteoarthritis 03/06/2007   Social History   Tobacco Use  . Smoking status: Former Smoker    Packs/day: 1.50    Years: 29.00    Pack years: 43.50    Types: Cigarettes    Start date: 07/08/1949    Last attempt to quit: 07/08/1978    Years since quitting: 40.1  . Smokeless tobacco: Never Used  Substance Use Topics  . Alcohol use: Yes    Alcohol/week: 1.0 standard drinks    Types: 1 Glasses of wine per week    Comment: "red wine"- some nights   Review of Systems  Constitutional: Negative for chills, fatigue and fever.  HENT: Negative for congestion, ear pain, sinus pain and sore throat.   Eyes: Negative.   Respiratory: Negative for cough, shortness of breath and wheezing.   Cardiovascular: Negative for chest pain, palpitations and leg swelling.  Gastrointestinal: Negative for abdominal pain, diarrhea, nausea and vomiting.  Genitourinary: Negative for dysuria, frequency and urgency.  Musculoskeletal: +left low back pain Skin: Negative for color change, pallor and rash.  Neurological: Negative for syncope, light-headedness and headaches.  Psychiatric/Behavioral: The patient is not nervous/anxious.       Objective:   Physical Exam Constitutional:      General: She is not in acute distress.    Appearance: She is not toxic-appearing.  HENT:     Head: Normocephalic.  Eyes:     General: No scleral icterus.    Extraocular Movements: Extraocular movements intact.     Pupils: Pupils are equal, round, and reactive to light.  Neck:     Musculoskeletal: Normal range of motion and neck supple. No neck rigidity.  Cardiovascular:     Rate and Rhythm: Normal rate and regular rhythm.  Pulmonary:       Effort: Pulmonary effort is normal. No respiratory distress.     Breath sounds: Normal breath sounds.  Abdominal:     General: There is no distension.     Palpations: Abdomen is soft.     Tenderness: There is no abdominal tenderness. There is no right CVA tenderness, left CVA tenderness or guarding.  Musculoskeletal:       Back:     Right lower leg: No edema.     Left lower leg: No edema.     Comments: Tenderness of pain indicated by blue box on diagram.  Pain is reproducible with palpation.   Skin:    General: Skin is warm and dry.     Capillary Refill: Capillary refill takes less than 2 seconds.     Coloration: Skin is not jaundiced or pale.     Findings: No bruising or  erythema.  Neurological:     Mental Status: She is alert and oriented to person, place, and time.     Comments: In wheelchair. Dorsiplantar flexion equal and strong. Grips equal and strong. Light touch sensation intact.      Today's Vitals   09/05/18 1023  BP: (!) 158/96  Pulse: 76  Temp: 97.7 F (36.5 C)  TempSrc: Oral  SpO2: 95%  Weight: 145 lb (65.8 kg)  Height: 5\' 6"  (1.676 m)   Body mass index is 23.4 kg/m.     Assessment & Plan:    A total of 40 minutes were spent face-to-face with the patient during this encounter and over half of that time was spent on counseling and coordination of care. The patient was counseled on her lab results and imaging results from the emergency room; possible causes of the back pain and why I believe it is a musculoskeletal type pain, plan for IM injection of steroid and continued use of muscle relaxer and Tylenol as recommended by emergency department, discussion of physical therapy referral and also doing daily stretching and range of motion exercises.  Left paraspinal back pain - due to location of pain and pain being reproducible with palpation I do suspect it is musculoskeletal in nature.  Patient advised that I feel the pain is related to combination of  arthritis flare and muscle spasm.  Patient seems upset with this answer, states "I do not think is arthritis".  Patient however is agreeable to IM steroid injection in clinic x1 to see if this helps pain improved.  She will continue to use Robaxin if needed for muscle spasm, and I also prescribed 10 tablets of Tylenol #3 to use if needed for more moderate/severe pain.  Patient also encouraged to continue using lidocaine patch on painful area, and do gentle stretching and range of motion exercises to help stretch muscles and avoid stiffness in the spine.  Daughter and patient made aware that I cannot speak as to why they did not do back x-ray in the emergency room, and due to [place] not having radiology technician at Saturday clinic I am able to do a back x-ray for her today in the clinic. But, we could send her across the street to the hospital to get x-ray done.  Daughter declines going across the street to get x-ray done today.  Physical therapy referral also placed for patient  [facility] PMP registry checked and is appropriate for prescription of Tylenol with codeine  Administrations This Visit    methylPREDNISolone acetate (DEPO-MEDROL) injection 40 mg    Admin Date 09/05/2018 Action Given Dose 40 mg Route Intramuscular Administered By Shanon Ace, CMA         Patient encouraged to keep regularly scheduled follow-up with PCP.  Advised she can return to clinic anytime if issues arise.

## 2018-09-05 NOTE — Patient Instructions (Signed)
Your lab work and imaging done in the ER on 09/02/2018 was unremarkable for any acute issues that would explain the left-sided low back pain.  I suspect pain is related to a musculoskeletal issue from a combination of arthritis flareup and muscle spasm.  Use Robaxin if needed for muscle spasm.  Use Tylenol as needed for pain.  Use topical lidocaine patch for pain if needed.  Ten tablets of Tylenol #3 prescribed to use if needed for more moderate to severe pain, Tylenol #3 is a combination of Tylenol with codeine and codeine as a narcotic pain medication, narcotic pain medications can cause drowsiness.

## 2018-09-06 ENCOUNTER — Encounter: Payer: Self-pay | Admitting: Adult Health

## 2018-09-08 ENCOUNTER — Ambulatory Visit: Payer: Medicare Other | Admitting: Adult Health

## 2018-09-08 NOTE — Telephone Encounter (Signed)
Appt cancelled.  No DPR for pt's daughter.

## 2018-09-14 ENCOUNTER — Ambulatory Visit: Payer: Medicare Other | Admitting: Pulmonary Disease

## 2018-10-07 ENCOUNTER — Other Ambulatory Visit: Payer: Self-pay | Admitting: Pulmonary Disease

## 2018-11-24 ENCOUNTER — Ambulatory Visit: Payer: Medicare Other | Admitting: Adult Health

## 2019-01-07 ENCOUNTER — Other Ambulatory Visit: Payer: Self-pay | Admitting: Physician Assistant

## 2019-01-10 ENCOUNTER — Other Ambulatory Visit: Payer: Self-pay | Admitting: Pulmonary Disease

## 2019-01-15 ENCOUNTER — Other Ambulatory Visit: Payer: Self-pay | Admitting: Adult Health

## 2019-01-18 ENCOUNTER — Encounter: Payer: Self-pay | Admitting: Adult Health

## 2019-01-19 ENCOUNTER — Encounter: Payer: Self-pay | Admitting: Adult Health

## 2019-01-19 NOTE — Telephone Encounter (Signed)
Pt daughter called back and I schedule her for a virtual visit with Tommi Rumps for tomorrow at 14

## 2019-01-20 ENCOUNTER — Other Ambulatory Visit: Payer: Self-pay

## 2019-01-20 ENCOUNTER — Ambulatory Visit (INDEPENDENT_AMBULATORY_CARE_PROVIDER_SITE_OTHER): Payer: Medicare Other | Admitting: Adult Health

## 2019-01-20 ENCOUNTER — Encounter: Payer: Self-pay | Admitting: Adult Health

## 2019-01-20 DIAGNOSIS — M79604 Pain in right leg: Secondary | ICD-10-CM | POA: Diagnosis not present

## 2019-01-20 DIAGNOSIS — M79605 Pain in left leg: Secondary | ICD-10-CM | POA: Diagnosis not present

## 2019-01-20 DIAGNOSIS — R238 Other skin changes: Secondary | ICD-10-CM

## 2019-01-20 DIAGNOSIS — R233 Spontaneous ecchymoses: Secondary | ICD-10-CM

## 2019-01-20 NOTE — Progress Notes (Signed)
Virtual Visit via Video Note  I connected with [name] on 01/20/19 at 11:00 AM EDT by a video enabled telemedicine application and verified that I am speaking with the correct person using two identifiers.  Location patient: home Location provider:work or home office Persons participating in the virtual visit: patient, provider, daughter   I discussed the limitations of evaluation and management by telemedicine and the availability of in person appointments. The patient expressed understanding and agreed to proceed.   HPI: She presents to the office today for the complaint of bruising throughout bilateral legs as well as pain in areas of both legs. She has always had some bruising due to thin skin and daily aspirin use. She reports worsening bruising to bilateral legs and arms. She also reports that over the last few weeks she has been experiencing worsening discomfort in her legs, she has pain from the water hitting her legs in the shower and, rubbing her hands on her legs, and even when she elevates her legs on a pillow or towel.  He denies shortness of breath past baseline, chest pain, or claudication symptoms.      ROS: See pertinent positives and negatives per HPI.  Past Medical History:  Diagnosis Date  . Anxiety   . Aortic arch atherosclerosis ([place]) 06/24/2014  . Atherosclerotic ulcer of aorta ([place]) 06/24/2014  . Carotid artery occlusion   . [place] DISEASE, LUMBAR 12/16/2008  . DIVERTICULOSIS, COLON 09/30/2008  . DYSPNEA 07/13/2008  . Graves disease   . History of embolic stroke 3/41/9379   Left brain  . HYPERLIPIDEMIA 03/06/2007  . HYPERTENSION 03/06/2007  . HYPOTHYROIDISM 10/13/2007  . OSTEOARTHRITIS 03/06/2007  . Personal history of colonic polyps 09/30/2008  . Stroke ([place])    06/2014           . TOBACCO USE, QUIT 04/12/2009  . WEAKNESS 11/09/2007    Past Surgical History:  Procedure Laterality Date  . CARDIAC CATHETERIZATION    . CATARACT EXTRACTION Bilateral   .  CHOLECYSTECTOMY    . ENDARTERECTOMY Left 11/13/2015   Procedure: LEFT CAROTID ARTERY ENDARTERECTOMY;  Surgeon: Conrad [place] York, MD;  Location: [facility] [place];  Service: Vascular;  Laterality: Left;  . ESOPHAGOGASTRODUODENOSCOPY (EGD) WITH PROPOFOL N/A 10/11/2016   Procedure: ESOPHAGOGASTRODUODENOSCOPY (EGD) WITH PROPOFOL;  Surgeon: Mauri Pole, MD;  Location: [facility] ENDOSCOPY;  Service: Endoscopy;  Laterality: N/A;  . KNEE SURGERY    . PATCH ANGIOPLASTY Left 11/13/2015   Procedure: WITH 1CM X 6CM  XENOSURE BIOLOGIC PATCH ANGIOPLASTY;  Surgeon: Conrad , MD;  Location: [place];  Service: Vascular;  Laterality: Left;  . TEE WITHOUT CARDIOVERSION N/A 06/24/2014   Procedure: TRANSESOPHAGEAL ECHOCARDIOGRAM (TEE);  Surgeon: Sanda Klein, MD;  [facility] ENDOSCOPY;  Service: Cardiovascular;  Laterality: N/A;    Family History  Problem Relation Age of Onset  . Stomach cancer Maternal Grandmother   . Colon cancer Neg Hx      Current Outpatient Medications:  .  acetaminophen (TYLENOL 8 HOUR) 650 MG CR tablet, Take 1 tablet (650 mg total) by mouth every 8 (eight) hours as needed., Disp: 30 tablet, Rfl: 0 .  acetaminophen-codeine (TYLENOL #3) 300-30 MG tablet, Take 1 tablet by mouth every 8 (eight) hours as needed for moderate pain., Disp: 10 tablet, Rfl: 0 .  albuterol (PROAIR HFA) 108 (90 Base) MCG/ACT inhaler, Inhale 2 puffs into the lungs every 6 (six) hours as needed for wheezing or shortness of breath., Disp: 8.5 Inhaler, Rfl: 5 .  amLODipine (NORVASC)  10 MG tablet, Take 1 tablet (10 mg total) by mouth daily., Disp: 90 tablet, Rfl: 3 .  aspirin EC 81 MG tablet, Take 81 mg by mouth daily., Disp: , Rfl:  .  atorvastatin (LIPITOR) 80 MG tablet, TAKE 1 TABLET BY MOUTH EVERY DAY AT 6PM, Disp: 90 tablet, Rfl: 1 .  doxycycline (VIBRAMYCIN) 100 MG capsule, Take 1 capsule (100 mg total) by mouth 2 (two) times daily., Disp: 14 capsule, Rfl: 0 .  Glycerin-Hypromellose-PEG 400 ([facility] DRY EYE RELIEF) 0.2-0.2-1 % SOLN,  Place 1 drop into both eyes as needed (dry eyes). , Disp: , Rfl:  .  hydrALAZINE (APRESOLINE) 25 MG tablet, TAKE 3 TABLETS BY MOUTH 3 TIMES A DAY (Patient taking differently: Take 75 mg by mouth 3 (three) times daily. ), Disp: 270 tablet, Rfl: 11 .  ipratropium-albuterol (DUONEB) 0.5-2.5 (3) MG/3ML SOLN, Take 3 mLs by nebulization every 6 (six) hours as needed., Disp: 360 mL, Rfl: 3 .  levothyroxine (SYNTHROID, LEVOTHROID) 75 MCG tablet, Take 1 tablet (75 mcg total) by mouth daily before breakfast., Disp: 90 tablet, Rfl: 3 .  Lidocaine 4 % PTCH, Apply 1 patch topically 2 (two) times daily., Disp: 12 patch, Rfl: 0 .  methocarbamol (ROBAXIN) 500 MG tablet, Take 1 tablet (500 mg total) by mouth daily as needed for muscle spasms., Disp: 5 tablet, Rfl: 0 .  predniSONE (DELTASONE) 10 MG tablet, Take 4 tablets x4 days, then 3 tablets x4 days, then 2 tablets x 4 days, then 1 tablet daily., Disp: 64 tablet, Rfl: 0 .  predniSONE (DELTASONE) 10 MG tablet, TAKE 1 TABLET (10 MG TOTAL) BY MOUTH DAILY WITH BREAKFAST., Disp: 30 tablet, Rfl: 3 .  Spacer/Aero-Holding Chambers (AEROCHAMBER MV) inhaler, Use as instructed, Disp: 1 each, Rfl: 0 .  TRELEGY ELLIPTA 100-62.5-25 MCG/INH AEPB, INHALE 1 PUFF INTO THE LUNGS DAILY, Disp: 60 each, Rfl: 3  EXAM:  VITALS per patient if applicable:  GENERAL: alert, oriented, appears well and in no acute distress  HEENT: atraumatic, conjunttiva clear, no obvious abnormalities on inspection of external nose and ears  NECK: normal movements of the head and neck  LUNGS: on inspection no signs of respiratory distress, breathing rate appears normal, no obvious gross SOB, gasping or wheezing  CV: no obvious cyanosis  MS: moves all visible extremities without noticeable abnormality  PSYCH/NEURO: pleasant and cooperative, no obvious depression or anxiety, speech and thought processing grossly intact  SKIN: Multiple bruises in various stages of healing on bilateral legs. Most  noticeable on right lateral aspect and left ventral aspect of  Lower extremities. Also has a bruise on left forearm   ASSESSMENT AND PLAN:  Discussed the following assessment and plan:  1. Easy bruising -Likely from aspirin therapy and thin skin.  She can try using diabetic socks to help prevent bruising from minor injuries.  2. Leg pain, bilateral -Appears to be more nerve pain.  Again we will have her try diabetic socks to provide some cushion.  She can use Voltaren gel to see if that benefits her in any way.  Advise follow-up if symptoms do not improve   I discussed the assessment and treatment plan with the patient. The patient was provided an opportunity to ask questions and all were answered. The patient agreed with the plan and demonstrated an understanding of the instructions.   The patient was advised to call back or seek an in-person evaluation if the symptoms worsen or if the condition fails to improve as anticipated.  [name], NP

## 2019-01-22 ENCOUNTER — Encounter: Payer: Self-pay | Admitting: Adult Health

## 2019-01-22 ENCOUNTER — Other Ambulatory Visit: Payer: Self-pay | Admitting: Adult Health

## 2019-01-22 ENCOUNTER — Telehealth: Payer: Self-pay | Admitting: Adult Health

## 2019-01-22 DIAGNOSIS — G629 Polyneuropathy, unspecified: Secondary | ICD-10-CM

## 2019-01-22 MED ORDER — DICLOFENAC SODIUM 1 % TD GEL: 2.0000 g | Freq: Four times a day (QID) | TRANSDERMAL | 2 refills | Status: DC | PRN

## 2019-01-22 NOTE — Telephone Encounter (Signed)
Patient daughter, [name],  called saying that the patient was at the beach about a week ago and her daughter had some concerns about getting a COVID test done. Patient does not have any symptoms but she was around people. Please advise.

## 2019-01-22 NOTE — Telephone Encounter (Signed)
[name] notified that no testing needed unless pt becomes symptomatic.  Nothing further needed at this time.

## 2019-01-22 NOTE — Telephone Encounter (Signed)
Since she is asymptomatic we do not need to test unless there was known or suspected know close exposure

## 2019-01-25 ENCOUNTER — Telehealth: Payer: Self-pay | Admitting: Nurse Practitioner

## 2019-01-25 MED ORDER — ASPIRIN EC 81 MG PO TBEC: DELAYED_RELEASE_TABLET | ORAL | 3 refills | Status: DC

## 2019-01-25 NOTE — Telephone Encounter (Signed)
Last CBC from February was ok.   Can have repeat CBC with diff if she would like. I suspect this is related to daily aspirin but probably more related to her daily prednisone use.   Ok to cut the aspirin back to just 5 days a week. CBC with diff if she agrees.   Burtis Junes, RN, [facility] [address] [place], [place]  [id number] [phone]

## 2019-01-25 NOTE — Telephone Encounter (Signed)
I spoke with pt's daughter. She reports pt has horrible bruising that started about a month ago. She will bruise easily if she anything touches her.  Also bleeds at times. Pt touched the cabinet the other day and developed bleeding.  Also very sensitive to anything that touches her skin on left leg.  Had telemedicine visit with [name], NP regarding this on 7/15.  Daughter also reports pt does not have swelling in legs when she gets up in the morning but within an hour develops swelling.  Left greater than right.  Is keeping legs elevated.  Will forward to Truitt Merle, NP for review/recommendations.

## 2019-01-25 NOTE — Telephone Encounter (Signed)
New Message   Patients daughter is calling about her mom. She states that her mom is having problems with the left side of her body. She has some bruising and they think that it may come from the blood thinner. If the patient touches herself it an automatic bruises and bleeds. This is simple as a touch from a towel. Please call to discuss.

## 2019-01-25 NOTE — Telephone Encounter (Signed)
I spoke with pt's daughter and gave her instructions from Kathrene Alu.  Pt is having blood work done at [facility] and it appears CBC with diff is ordered. Daughter thinks this was ordered but will confirm when pt goes for lab work tomorrow.

## 2019-01-26 ENCOUNTER — Other Ambulatory Visit (INDEPENDENT_AMBULATORY_CARE_PROVIDER_SITE_OTHER): Payer: Medicare Other

## 2019-01-26 ENCOUNTER — Other Ambulatory Visit: Payer: Self-pay

## 2019-01-26 DIAGNOSIS — G629 Polyneuropathy, unspecified: Secondary | ICD-10-CM | POA: Diagnosis not present

## 2019-01-26 LAB — BASIC METABOLIC PANEL
BUN: 22 mg/dL (ref 6–23)
CO2: 27 mEq/L (ref 19–32)
Calcium: 9.1 mg/dL (ref 8.4–10.5)
Chloride: 105 mEq/L (ref 96–112)
Creatinine, Ser: 1.19 mg/dL (ref 0.40–1.20)
GFR: 42.46 mL/min — ABNORMAL LOW (ref >60.00)
Glucose, Bld: 97 mg/dL (ref 70–99)
Potassium: 4.1 mEq/L (ref 3.5–5.1)
Sodium: 141 mEq/L (ref 135–145)

## 2019-01-26 LAB — CBC WITH DIFFERENTIAL/PLATELET
Basophils Absolute: 0 10*3/uL (ref 0.0–0.1)
Basophils Relative: 0.6 % (ref 0.0–3.0)
Eosinophils Absolute: 0.1 10*3/uL (ref 0.0–0.7)
Eosinophils Relative: 1 % (ref 0.0–5.0)
HCT: 40.2 % (ref 36.0–46.0)
Hemoglobin: 13.5 g/dL (ref 12.0–15.0)
Lymphocytes Relative: 15.3 % (ref 12.0–46.0)
Lymphs Abs: 1.2 10*3/uL (ref 0.7–4.0)
MCHC: 33.5 g/dL (ref 30.0–36.0)
MCV: 92.2 fl (ref 78.0–100.0)
Monocytes Absolute: 0.9 10*3/uL (ref 0.1–1.0)
Monocytes Relative: 11.2 % (ref 3.0–12.0)
Neutro Abs: 5.8 10*3/uL (ref 1.4–7.7)
Neutrophils Relative %: 71.9 % (ref 43.0–77.0)
Platelets: 196 10*3/uL (ref 150.0–400.0)
RBC: 4.36 Mil/uL (ref 3.87–5.11)
RDW: 14.7 % (ref 11.5–15.5)
WBC: 8 10*3/uL (ref 4.0–10.5)

## 2019-01-26 LAB — HEMOGLOBIN A1C: Hgb A1c MFr Bld: 6 % (ref 4.6–6.5)

## 2019-01-27 ENCOUNTER — Other Ambulatory Visit: Payer: Self-pay | Admitting: Pulmonary Disease

## 2019-02-02 ENCOUNTER — Ambulatory Visit: Payer: Medicare Other | Admitting: Adult Health

## 2019-02-03 ENCOUNTER — Ambulatory Visit: Payer: Medicare Other | Admitting: Adult Health

## 2019-02-03 ENCOUNTER — Other Ambulatory Visit: Payer: Self-pay

## 2019-02-03 ENCOUNTER — Encounter: Payer: Self-pay | Admitting: Adult Health

## 2019-02-03 VITALS — BP 140/80 | Temp 97.8°F | Wt 139.0 lb

## 2019-02-03 DIAGNOSIS — M79604 Pain in right leg: Secondary | ICD-10-CM | POA: Diagnosis not present

## 2019-02-03 DIAGNOSIS — M79605 Pain in left leg: Secondary | ICD-10-CM

## 2019-02-03 DIAGNOSIS — I872 Venous insufficiency (chronic) (peripheral): Secondary | ICD-10-CM | POA: Diagnosis not present

## 2019-02-03 MED ORDER — HYDROCHLOROTHIAZIDE 12.5 MG PO CAPS: 12.5000 mg | ORAL_CAPSULE | Freq: Every day | ORAL | 0 refills | Status: DC

## 2019-02-03 NOTE — Progress Notes (Signed)
Subjective:    Patient ID: [name], female    DOB: 1925-06-24, 85 y.o.   MRN: [id number]  HPI   85 year old female who  has a past medical history of Anxiety, Aortic arch atherosclerosis ([place]) (06/24/2014), Atherosclerotic ulcer of aorta ([place]) (06/24/2014), Carotid artery occlusion, [place] DISEASE, LUMBAR (12/16/2008), DIVERTICULOSIS, COLON (09/30/2008), DYSPNEA (07/13/2008), Graves disease, History of embolic stroke (6/96/2952), HYPERLIPIDEMIA (03/06/2007), HYPERTENSION (03/06/2007), HYPOTHYROIDISM (10/13/2007), OSTEOARTHRITIS (03/06/2007), Personal history of colonic polyps (09/30/2008), Stroke ([place]), TOBACCO USE, QUIT (04/12/2009), and WEAKNESS (11/09/2007).  Presents to the office today for follow-up regarding bilateral leg pain as well as bilateral lower extremity edema.  Symptoms started approximately 1 month ago.  Per patient, she reports pain in her lower extremities in certain areas such as throughout the lateral aspects of bilateral legs, water from the shower causes pain as well as brushing against it with a towel.  She has never had this issue in the past and believes that it started around the same time that both of her legs started swelling.  Reports that when she gets up in the morning she does not have any lower extremity edema and then throughout the day the edema becomes worse especially around the ankles.  The edema presents from across the top of both feet up to about midcalf at times.  Denies calf pain, redness, or warmth.  He has not experienced any chest pain or shortness of breath past baseline  She has had some bruising recently but is also on aspirin.  She stopped taking aspirin during the week and only takes it on the weekends and noticed that the bruising has improved   Review of Systems  Constitutional: Negative.   Respiratory: Positive for shortness of breath (chronic). Negative for chest tightness and wheezing.   Cardiovascular: Positive for leg swelling. Negative for chest  pain and palpitations.  Gastrointestinal: Negative.   Genitourinary: Negative.   Musculoskeletal: Negative.   Neurological: Negative.   Hematological: Negative.   Psychiatric/Behavioral: Negative.   All other systems reviewed and are negative.  Past Medical History:  Diagnosis Date  . Anxiety   . Aortic arch atherosclerosis ([place]) 06/24/2014  . Atherosclerotic ulcer of aorta ([place]) 06/24/2014  . Carotid artery occlusion   . [place] DISEASE, LUMBAR 12/16/2008  . DIVERTICULOSIS, COLON 09/30/2008  . DYSPNEA 07/13/2008  . Graves disease   . History of embolic stroke 8/41/3244   Left brain  . HYPERLIPIDEMIA 03/06/2007  . HYPERTENSION 03/06/2007  . HYPOTHYROIDISM 10/13/2007  . OSTEOARTHRITIS 03/06/2007  . Personal history of colonic polyps 09/30/2008  . Stroke ([place])    06/2014           . TOBACCO USE, QUIT 04/12/2009  . WEAKNESS 11/09/2007    Social History   Socioeconomic History  . Marital status: Divorced    Spouse name: Not on file  . Number of children: 4  . Years of education: college  . Highest education level: Not on file  Occupational History  . Occupation: retired  Scientific laboratory technician  . Financial resource strain: Not on file  . Food insecurity    Worry: Not on file    Inability: Not on file  . Transportation needs    Medical: Not on file    Non-medical: Not on file  Tobacco Use  . Smoking status: Former Smoker    Packs/day: 1.50    Years: 29.00    Pack years: 43.50    Types: Cigarettes    Start date: 07/08/1949  Quit date: 07/08/1978    Years since quitting: 40.6  . Smokeless tobacco: Never Used  Substance and Sexual Activity  . Alcohol use: Yes    Alcohol/week: 1.0 standard drinks    Types: 1 Glasses of wine per week    Comment: "red wine"- some nights  . Drug use: No  . Sexual activity: Not on file  Lifestyle  . Physical activity    Days per week: Not on file    Minutes per session: Not on file  . Stress: Not on file  Relationships  . Social Herbalist  on phone: Not on file    Gets together: Not on file    Attends religious service: Not on file    Active member of club or organization: Not on file    Attends meetings of clubs or organizations: Not on file    Relationship status: Not on file  . Intimate partner violence    Fear of current or ex partner: Not on file    Emotionally abused: Not on file    Physically abused: Not on file    Forced sexual activity: Not on file  Other Topics Concern  . Not on file  Social History Narrative      Patient is right handed.   Patient drinks 1 cup caffeine daily.    Past Surgical History:  Procedure Laterality Date  . CARDIAC CATHETERIZATION    . CATARACT EXTRACTION Bilateral   . CHOLECYSTECTOMY    . ENDARTERECTOMY Left 11/13/2015   Procedure: LEFT CAROTID ARTERY ENDARTERECTOMY;  Surgeon: Conrad [place], MD;  Location: [place];  Service: Vascular;  Laterality: Left;  . ESOPHAGOGASTRODUODENOSCOPY (EGD) WITH PROPOFOL N/A 10/11/2016   Procedure: ESOPHAGOGASTRODUODENOSCOPY (EGD) WITH PROPOFOL;  Surgeon: Mauri Pole, MD;  Location: [facility] ENDOSCOPY;  Service: Endoscopy;  Laterality: N/A;  . KNEE SURGERY    . PATCH ANGIOPLASTY Left 11/13/2015   Procedure: WITH 1CM X 6CM  XENOSURE BIOLOGIC PATCH ANGIOPLASTY;  Surgeon: Conrad [place], MD;  Location: [place];  Service: Vascular;  Laterality: Left;  . TEE WITHOUT CARDIOVERSION N/A 06/24/2014   Procedure: TRANSESOPHAGEAL ECHOCARDIOGRAM (TEE);  Surgeon: Sanda Klein, MD;  Location: [facility] ENDOSCOPY;  Service: Cardiovascular;  Laterality: N/A;    Family History  Problem Relation Age of Onset  . Stomach cancer Maternal Grandmother   . Colon cancer Neg Hx     Allergies  Allergen Reactions  . Catapres [Clonidine Hcl] Other (See Comments)    Made pt feel horrible, shaky, weak and nausea  . Lisinopril Anaphylaxis    Tongue swelling  . Labetalol Hcl Other (See Comments)    Caused bradycardia and syncope    Current Outpatient Medications on File Prior to Visit   Medication Sig Dispense Refill  . acetaminophen (TYLENOL 8 HOUR) 650 MG CR tablet Take 1 tablet (650 mg total) by mouth every 8 (eight) hours as needed. 30 tablet 0  . acetaminophen-codeine (TYLENOL #3) 300-30 MG tablet Take 1 tablet by mouth every 8 (eight) hours as needed for moderate pain. 10 tablet 0  . albuterol (PROAIR HFA) 108 (90 Base) MCG/ACT inhaler Inhale 2 puffs into the lungs every 6 (six) hours as needed for wheezing or shortness of breath. 8.5 Inhaler 5  . amLODipine (NORVASC) 10 MG tablet Take 1 tablet (10 mg total) by mouth daily. 90 tablet 3  . aspirin EC 81 MG tablet Take one tablet by mouth 5 days per week 90 tablet 3  . atorvastatin (LIPITOR)  80 MG tablet TAKE 1 TABLET BY MOUTH EVERY DAY AT 6PM 90 tablet 1  . diclofenac sodium (VOLTAREN) 1 % GEL Apply 2 g topically 4 (four) times daily as needed. 50 g 2  . doxycycline (VIBRAMYCIN) 100 MG capsule Take 1 capsule (100 mg total) by mouth 2 (two) times daily. 14 capsule 0  . Glycerin-Hypromellose-PEG 400 ([facility] DRY EYE RELIEF) 0.2-0.2-1 % SOLN Place 1 drop into both eyes as needed (dry eyes).     . hydrALAZINE (APRESOLINE) 25 MG tablet TAKE 3 TABLETS BY MOUTH 3 TIMES A DAY (Patient taking differently: Take 75 mg by mouth 3 (three) times daily. ) 270 tablet 11  . ipratropium-albuterol (DUONEB) 0.5-2.5 (3) MG/3ML SOLN Take 3 mLs by nebulization every 6 (six) hours as needed. 360 mL 3  . levothyroxine (SYNTHROID, LEVOTHROID) 75 MCG tablet Take 1 tablet (75 mcg total) by mouth daily before breakfast. 90 tablet 3  . Lidocaine 4 % PTCH Apply 1 patch topically 2 (two) times daily. 12 patch 0  . methocarbamol (ROBAXIN) 500 MG tablet Take 1 tablet (500 mg total) by mouth daily as needed for muscle spasms. 5 tablet 0  . predniSONE (DELTASONE) 10 MG tablet Take 4 tablets x4 days, then 3 tablets x4 days, then 2 tablets x 4 days, then 1 tablet daily. 64 tablet 0  . predniSONE (DELTASONE) 10 MG tablet TAKE 1 TABLET (10 MG TOTAL) BY MOUTH DAILY WITH  BREAKFAST. 30 tablet 3  . Spacer/Aero-Holding Chambers (AEROCHAMBER MV) inhaler Use as instructed 1 each 0  . TRELEGY ELLIPTA 100-62.5-25 MCG/INH AEPB INHALE 1 PUFF INTO THE LUNGS EVERY DAY. 60 each 0   No current facility-administered medications on file prior to visit.     BP 140/80   Temp 97.8 F (36.6 C)   Wt 139 lb (63 kg)   BMI 22.44 kg/m       Objective:   Physical Exam Vitals signs and nursing note reviewed.  Constitutional:      Appearance: Normal appearance.  Cardiovascular:     Rate and Rhythm: Normal rate and regular rhythm.     Pulses: Normal pulses.     Heart sounds: Murmur present. Systolic murmur present with a grade of 2/6.  Pulmonary:     Effort: Pulmonary effort is normal.     Breath sounds: Normal breath sounds.  Musculoskeletal:        General: Swelling and tenderness present.     Right lower leg: Edema present.     Left lower leg: Edema present.     Comments: +2 bilateral pitting edema that is most noticeable across the dorsal aspect of both feet as well as around the ankles.  She does have mild pitting edema to about mid calf.  She does not have any calf pain, redness or warmth.  Tenderness along the lateral aspect of right leg as well as lateral aspect of left leg, some mild tenderness along bilateral medial aspect of ankles  Skin:    Capillary Refill: Capillary refill takes less than 2 seconds.  Neurological:     General: No focal deficit present.     Mental Status: She is alert and oriented to person, place, and time.  Psychiatric:        Mood and Affect: Mood normal.        Behavior: Behavior normal.        Thought Content: Thought content normal.        Judgment: Judgment normal.  Assessment & Plan:  .1. Venous insufficiency -We will trial short course of low-dose hydrochlorothiazide for lower extremity edema.  Possibly will help with leg pain that she is experiencing.  There is no concern for DVT at this time.  We will follow-up with  her in 5 days - hydrochlorothiazide (MICROZIDE) 12.5 MG capsule; Take 1 capsule (12.5 mg total) by mouth daily for 10 days.  Dispense: 10 capsule; Refill: 0  2. Leg pain, bilateral - hydrochlorothiazide (MICROZIDE) 12.5 MG capsule; Take 1 capsule (12.5 mg total) by mouth daily for 10 days.  Dispense: 10 capsule; Refill: 0  [name], NP

## 2019-02-03 NOTE — Patient Instructions (Signed)
It was great seeing you today   I am going to prescribe a short course of a diuretic to see if we can get this fluid off you and take away the pain   We will follow up with you next week

## 2019-02-09 ENCOUNTER — Telehealth: Payer: Self-pay

## 2019-02-09 NOTE — Telephone Encounter (Signed)
Copied from [place] [phone] [phone]. Topic: Quick Communication - See Telephone Encounter >> Feb 09, 2019  3:52 PM Loma Boston wrote: CRM for notification. See Telephone encounter for: 02/09/19. Tommi Rumps was supposed to follow up on pt fluid retention and it is not doing good at all withswelling and bruising just because daughter touched her. The med is not working. Ankles are swelling still  Was to follow up by phone today, please FU with pt. [phone]

## 2019-02-10 ENCOUNTER — Telehealth: Payer: Self-pay | Admitting: Adult Health

## 2019-02-10 DIAGNOSIS — M79604 Pain in right leg: Secondary | ICD-10-CM

## 2019-02-10 DIAGNOSIS — I872 Venous insufficiency (chronic) (peripheral): Secondary | ICD-10-CM

## 2019-02-10 MED ORDER — HYDROCHLOROTHIAZIDE 25 MG PO TABS: 25.0000 mg | ORAL_TABLET | Freq: Every day | ORAL | 1 refills | Status: DC

## 2019-02-10 NOTE — Telephone Encounter (Signed)
Poke to patient about lower extremity edema as well as pain to the lower extremities with touch.  I had her increase her hydrochlorothiazide to 25 mg is 12.5 was not working well.  She does report improvement in her lower extremity edema with the 25 mg dose.  She reports that lower extremity edema is gone in the morning but as the day progresses the edema becomes worse.  She has not noticed any difference in pain with palpation to bilateral legs.  We discussed trying gabapentin but she would like to trial topical lidocaine that she already has a prescription for first.  I am okay with this and will follow-up at the end of the week

## 2019-02-14 NOTE — Progress Notes (Signed)
CARDIOLOGY OFFICE NOTE  Date:  02/16/2019    [name] Date of Birth: 29-Sep-1924 Medical Record #[id number]  PCP:  [name], NP  Cardiologist:  Gillian Shields    Chief Complaint  Patient presents with  . Follow-up    History of Present Illness: Jakai KATIANNA MCCLENNEY is a 85 y.o. female who presents today for a 6 month check. Seen for Dr. Johnsie Cancel.Has primarily followed with me over the past several years.  She has ahistory of labile HTN, HLD, hypothyroidism, CVA (left parietal in Dec. 2015), carotid artery diease w/high-grade left ICA stenosis with plaque ulceration, and pulmonary nodule.  Shehas beenfollowed by vascular surgery (previously Dr. Bridgett Larsson) and was scheduled for cerebral angiogram. Doppler evaluation suggested a long LICA stenosis >38%. She became concerned about the risk of stroke and asked to have the procedure postponed until after cardiac evaluation. She was seen by Dr. Johnsie Cancel on 10/03/15 for pre operative clearance for possible LCEA.She was started on labetalol and her Norvasc was increased for better blood pressure control.  She took her first does of labetalol the following morning and shortly after began to feel weak, flushed and became very hot and had an episode of syncope. EMS was called and she was admitted 3/29-3/30/17 for work up. Her symptoms gradually improved. She was felt to be very sensitive to the labetalol with hypotension and bradycardia and now it is listed as an allergy. She had a Myoview during her admission on 10/03/15 which was low risk without scar or ischemia, EF normal. She was cleared for vascular surgery. It was felt that she should proceed with surgery as soon as possible. Diuretic therapy was discontinued due to mild renal insufficiency. She was discharged on amlodipine only for BP control.   Subsequentmultiplephone notes revealingthat she was having issues with BPs and trialed on hydralazine and clonidine. She did not  tolerate the clonidine and this was discontinued after an ER visit.Her anxiety has played a big role in her BPP management.  Planwasfor L CEA on May of 2017- looks like this complicated by a vocal cord paralysis and "with possible TE from extensive aortic arch atherosclerosis".She wasreluctant to start anticoagulation. She elected to take ASA and Plavix instead.But then was admitted withprofound anemia in the setting of aspirin and Plavix - transfused - had EGD and noted large AVM that was clipped. She is to stay just on aspirin despite being at risk for embolism from the aortic arch atherosclerosis.  I have seen her several times since - she has tended to be very fixated on past events.  Has done ok from our standpoint. Does not check her BP any more. She has had several COPD exacerbations over the past year. She has had long standing daily alcohol use. Noted that she had seen pulmonary and had had a discussion about DNR status - she was thinking that over.   The patient does not have symptoms concerning for COVID-19 infection (fever, chills, cough, or new shortness of breath).   Comes in today. Here with her daughter. Not doing well. Skin is worse. She is now on chronic steroid therapy. More swelling in her legs. Her legs hurt - easily bruises. Has cut her aspirin to just 5 days.  PCP gave HCTZ to help - then increased it - but she stopped it - not really clear to me as to why. She tells me her swelling is going down overnight.  Weight is actually down from our last visit.  Her appetite is good. Drinking one glass of wine a night. She has no idea how her BP is - she does not check it and has no intention of checking. Swelling worse on the left side than the right.  She notes the Prednisone has "been a god send for her" and she can breath.     Past Medical History:  Diagnosis Date  . Anxiety   . Aortic arch atherosclerosis ([place]) 06/24/2014  . Atherosclerotic ulcer of aorta ([place])  06/24/2014  . Carotid artery occlusion   . [place] DISEASE, LUMBAR 12/16/2008  . DIVERTICULOSIS, COLON 09/30/2008  . DYSPNEA 07/13/2008  . Graves disease   . History of embolic stroke 7/86/7672   Left brain  . HYPERLIPIDEMIA 03/06/2007  . HYPERTENSION 03/06/2007  . HYPOTHYROIDISM 10/13/2007  . OSTEOARTHRITIS 03/06/2007  . Personal history of colonic polyps 09/30/2008  . Stroke ([place])    06/2014           . TOBACCO USE, QUIT 04/12/2009  . WEAKNESS 11/09/2007    Past Surgical History:  Procedure Laterality Date  . CARDIAC CATHETERIZATION    . CATARACT EXTRACTION Bilateral   . CHOLECYSTECTOMY    . ENDARTERECTOMY Left 11/13/2015   Procedure: LEFT CAROTID ARTERY ENDARTERECTOMY;  Surgeon: Conrad Indiantown, MD;  Location: [facility];  Service: Vascular;  Laterality: Left;  . ESOPHAGOGASTRODUODENOSCOPY (EGD) WITH PROPOFOL N/A 10/11/2016   Procedure: ESOPHAGOGASTRODUODENOSCOPY (EGD) WITH PROPOFOL;  Surgeon: Mauri Pole, MD;  Location: [facility] ENDOSCOPY;  Service: Endoscopy;  Laterality: N/A;  . KNEE SURGERY    . PATCH ANGIOPLASTY Left 11/13/2015   Procedure: WITH 1CM X 6CM  XENOSURE BIOLOGIC PATCH ANGIOPLASTY;  Surgeon: Conrad Trenton, MD;  Location: [facility];  Service: Vascular;  Laterality: Left;  . TEE WITHOUT CARDIOVERSION N/A 06/24/2014   Procedure: TRANSESOPHAGEAL ECHOCARDIOGRAM (TEE);  Surgeon: Sanda Klein, MD;  Location: [facility] ENDOSCOPY;  Service: Cardiovascular;  Laterality: N/A;     Medications: Current Meds  Medication Sig  . albuterol (PROAIR HFA) 108 (90 Base) MCG/ACT inhaler Inhale 2 puffs into the lungs every 6 (six) hours as needed for wheezing or shortness of breath.  [name] Kitchen amLODipine (NORVASC) 10 MG tablet Take 0.5 tablets (5 mg total) by mouth daily.  [name] Kitchen aspirin EC 81 MG tablet Take one tablet by mouth 5 days per week  . atorvastatin (LIPITOR) 80 MG tablet TAKE 1 TABLET BY MOUTH EVERY DAY AT 6PM  . Glycerin-Hypromellose-PEG 400 ([employer] DRY EYE RELIEF) 0.2-0.2-1 % SOLN Place 1 drop into both eyes as needed  (dry eyes).   . hydrALAZINE (APRESOLINE) 25 MG tablet TAKE 3 TABLETS BY MOUTH 3 TIMES A DAY (Patient taking differently: Take 75 mg by mouth 3 (three) times daily. )  . hydrochlorothiazide (HYDRODIURIL) 25 MG tablet Take 0.5 tablets (12.5 mg total) by mouth daily.  [name] Kitchen ipratropium-albuterol (DUONEB) 0.5-2.5 (3) MG/3ML SOLN Take 3 mLs by nebulization every 6 (six) hours as needed.  [name] Kitchen levothyroxine (SYNTHROID, LEVOTHROID) 75 MCG tablet Take 1 tablet (75 mcg total) by mouth daily before breakfast.  . Lidocaine 4 % PTCH Apply 1 patch topically 2 (two) times daily. (Patient taking differently: Apply 1 patch topically as needed. Right hip pain)  . predniSONE (DELTASONE) 10 MG tablet TAKE 1 TABLET (10 MG TOTAL) BY MOUTH DAILY WITH BREAKFAST.  [name] Kitchen Spacer/Aero-Holding Chambers (AEROCHAMBER MV) inhaler Use as instructed  . TRELEGY ELLIPTA 100-62.5-25 MCG/INH AEPB INHALE 1 PUFF INTO THE LUNGS EVERY DAY.  . [DISCONTINUED] acetaminophen (TYLENOL 8 HOUR) 650 MG CR tablet  Take 1 tablet (650 mg total) by mouth every 8 (eight) hours as needed.  . [DISCONTINUED] acetaminophen-codeine (TYLENOL #3) 300-30 MG tablet Take 1 tablet by mouth every 8 (eight) hours as needed for moderate pain.  . [DISCONTINUED] amLODipine (NORVASC) 10 MG tablet Take 1 tablet (10 mg total) by mouth daily.  . [DISCONTINUED] diclofenac sodium (VOLTAREN) 1 % GEL Apply 2 g topically 4 (four) times daily as needed.  . [DISCONTINUED] doxycycline (VIBRAMYCIN) 100 MG capsule Take 1 capsule (100 mg total) by mouth 2 (two) times daily.  . [DISCONTINUED] hydrochlorothiazide (HYDRODIURIL) 25 MG tablet Take 1 tablet (25 mg total) by mouth daily.  . [DISCONTINUED] methocarbamol (ROBAXIN) 500 MG tablet Take 1 tablet (500 mg total) by mouth daily as needed for muscle spasms.  . [DISCONTINUED] predniSONE (DELTASONE) 10 MG tablet Take 4 tablets x4 days, then 3 tablets x4 days, then 2 tablets x 4 days, then 1 tablet daily.     Allergies: Allergies  Allergen  Reactions  . Catapres [Clonidine Hcl] Other (See Comments)    Made pt feel horrible, shaky, weak and nausea  . Lisinopril Anaphylaxis    Tongue swelling  . Labetalol Hcl Other (See Comments)    Caused bradycardia and syncope    Social History: The patient  reports that she quit smoking about 40 years ago. Her smoking use included cigarettes. She started smoking about 69 years ago. She has a 43.50 pack-year smoking history. She has never used smokeless tobacco. She reports current alcohol use of about 1.0 standard drinks of alcohol per week. She reports that she does not use drugs.   Family History: The patient's family history includes Stomach cancer in her maternal grandmother.   Review of Systems: Please see the history of present illness.   All other systems are reviewed and negative.   Physical Exam: VS:  BP (!) 142/74 (BP Location: Left Arm, Patient Position: Sitting, Cuff Size: Normal)   Pulse 89   Ht 5\' 6"  (1.676 m)   Wt 137 lb 1.9 oz (62.2 kg)   SpO2 94%   BMI 22.13 kg/m  .  BMI Body mass index is 22.13 kg/m.  Wt Readings from Last 3 Encounters:  02/16/19 137 lb 1.9 oz (62.2 kg)  02/03/19 139 lb (63 kg)  09/05/18 145 lb (65.8 kg)    General: Pleasant. Elderly. Alert and in no acute distress.   HEENT: Normal.  Neck: Supple, no JVD, carotid bruits, or masses noted.  Cardiac: Regular rate and rhythm. No murmurs, rubs, or gallops. 1+ edema.  Respiratory:  Lungs are clear to auscultation bilaterally with normal work of breathing.  GI: Soft and nontender.  MS: No deformity or atrophy. Gait and ROM intact.  Skin: Warm and dry. Color is normal. Skin with lots of bruising - easy friability.  Neuro:  Strength and sensation are intact and no gross focal deficits noted.  Psych: Alert, appropriate and with normal affect.   LABORATORY DATA:  EKG:  EKG is ordered today. This demonstrates NSR with PVCs - she is not in AF.  Lab Results  Component Value Date   WBC 8.0  01/26/2019   HGB 13.5 01/26/2019   HCT 40.2 01/26/2019   PLT 196.0 01/26/2019   GLUCOSE 97 01/26/2019   CHOL 148 01/13/2018   TRIG 31 01/13/2018   HDL 76 01/13/2018   LDLDIRECT 141.7 05/01/2011   LDLCALC 66 01/13/2018   ALT 47 (H) 09/02/2018   AST 29 09/02/2018   NA 141 01/26/2019  K 4.1 01/26/2019   CL 105 01/26/2019   CREATININE 1.19 01/26/2019   BUN 22 01/26/2019   CO2 27 01/26/2019   TSH 0.616 03/02/2018   INR 0.93 08/17/2018   HGBA1C 6.0 01/26/2019     BNP (last 3 results) Recent Labs    03/02/18 0924  BNP 81.9    ProBNP (last 3 results) No results for input(s): PROBNP in the last 8760 hours.   Other Studies Reviewed Today:  EchoStudy Conclusions7/2019  - Left ventricle: The cavity size was normal. There was moderate concentric hypertrophy. Systolic function was normal. The estimated ejection fraction was in the range of 55% to 60%. Wall motion was normal; there were no regional wall motion abnormalities. - Aortic valve: Valve mobility was mildly restricted. There was mild stenosis. There was trivial regurgitation. Valve area (VTI): 1.53 cm^2. Valve area (Vmean): 1.42 cm^2. - Atrial septum: There was increased thickness of the septum, consistent with lipomatous hypertrophy.  Impressions:  - Normal LV ejection fraction, mild aortic stenosis.   Nm Myocar Multi W/spect W/wall Motion / Ef 10/05/2015   There was no ST segment deviation noted during stress.  Defect 1: There is a medium defect of mild severity present in the mid anteroseptal, mid inferoseptal and apical septal location.  This is a low risk study.  The study is normal.  The left ventricular ejection fraction is normal (55-65%).  Nuclear stress EF: 57%. Low risk stress nuclear study with LBBB-related perfusion artifact, otherwise normal perfusion and normal left ventricular regional and global systolic function.   CTA Neck (10/09/15) Advanced atherosclerotic plaque  of the thoracic aorta and distal aortic arch.  50% diameter stenosis proximal right internal carotid artery  Irregular plaque of the distal left common carotid artery with plaque ulceration. Very irregular plaque involving the proximal left internal carotid artery. Critical 85% stenosis of the proximal left internal carotid artery  Mild atherosclerotic disease in the right vertebral artery without significant vertebral artery stenosis.  Heterogeneous thyroid bilaterally with 11 mm right lower pole nodule. Recommend thyroid ultrasound.    Assessment/Plan:  1.Labile HTN: on high dose Norvasc. Now on chronic steroid - most likely causing the bruising and both agents promoting swelling. Cutting Norvasc in half and restarting HCTZ at 12.5 mg a day. Lab on return. She is not going to check her BP at home.   2.Carotid artery diease w/high-grade left ICA stenosis with plaque ulceration:s/p CEA 0623 whichwas complicated by a left vocal cord paralysis. Noted severe atherosclerotic burden in the arch with concerns for possible embolism.She is followed by [facility].Not addressed.   3.HLD:on statin therapy  4. COPD/Pulmonary nodule(noted to be likely benign): now on chronic steroid therapy and this has helped.   5.Hypothyroidism:per PCP  6.Previous CVA: Continue aspirin and statin.  7.Anxiety:always an issue -this seems better today. Not discussed today.   8. Swelling- see #1.   9. Advanced age- doing quite well overall. She still drives.   10 Daily alcohol use - I do not see this changing. Admits to just one glass of wine a night.   20. COVID-19 Education: The signs and symptoms of COVID-19 were discussed with the patient and how to seek care for testing (follow up with PCP or arrange E-visit).  The importance of social distancing, staying at home, hand hygiene and wearing a mask when out in public were discussed today.  Current medicines are reviewed  with the patient today.  The patient does not have concerns regarding medicines other than what  has been noted above.  The following changes have been made:  See above.  Labs/ tests ordered today include:    Orders Placed This Encounter  Procedures  . EKG 12-Lead     Disposition:   FU with me in about 4 to 6 weeks. Will check lab on return.   Patient is agreeable to this plan and will call if any problems develop in the interim.   SignedTruitt Merle, NP  02/16/2019 3:10 PM  [facility] [address] [place], [place]  [address] Phone: [phone] Fax: [phone]

## 2019-02-16 ENCOUNTER — Ambulatory Visit (INDEPENDENT_AMBULATORY_CARE_PROVIDER_SITE_OTHER): Payer: Medicare Other | Admitting: Nurse Practitioner

## 2019-02-16 ENCOUNTER — Other Ambulatory Visit: Payer: Self-pay

## 2019-02-16 ENCOUNTER — Encounter: Payer: Self-pay | Admitting: Nurse Practitioner

## 2019-02-16 VITALS — BP 142/74 | HR 89 | Ht 66.0 in | Wt 137.1 lb

## 2019-02-16 DIAGNOSIS — R0989 Other specified symptoms and signs involving the circulatory and respiratory systems: Secondary | ICD-10-CM

## 2019-02-16 DIAGNOSIS — I872 Venous insufficiency (chronic) (peripheral): Secondary | ICD-10-CM

## 2019-02-16 DIAGNOSIS — I10 Essential (primary) hypertension: Secondary | ICD-10-CM | POA: Diagnosis not present

## 2019-02-16 DIAGNOSIS — Z8673 Personal history of transient ischemic attack (TIA), and cerebral infarction without residual deficits: Secondary | ICD-10-CM

## 2019-02-16 DIAGNOSIS — M79604 Pain in right leg: Secondary | ICD-10-CM

## 2019-02-16 DIAGNOSIS — R609 Edema, unspecified: Secondary | ICD-10-CM

## 2019-02-16 DIAGNOSIS — M79605 Pain in left leg: Secondary | ICD-10-CM

## 2019-02-16 MED ORDER — HYDROCHLOROTHIAZIDE 25 MG PO TABS: 12.5000 mg | ORAL_TABLET | Freq: Every day | ORAL | 1 refills | Status: DC

## 2019-02-16 MED ORDER — AMLODIPINE BESYLATE 10 MG PO TABS: 5.0000 mg | ORAL_TABLET | Freq: Every day | ORAL | 3 refills | Status: DC

## 2019-02-16 NOTE — Patient Instructions (Addendum)
After Visit Summary:  We will be checking the following labs today - NONE   Medication Instructions:    Continue with your current medicines. BUT  Decrease the Amlodipine to 1/2 a tablet (5mg )  Go back on just 1/2 of the HCTZ (12.5) daily   If you need a refill on your cardiac medications before your next appointment, please call your pharmacy.     Testing/Procedures To Be Arranged:  N/A  Follow-Up:   See me in 4 to 6 weeks with lab    At [facility], you and your health needs are our priority.  As part of our continuing mission to provide you with exceptional heart care, we have created designated Provider Care Teams.  These Care Teams include your primary Cardiologist (physician) and Advanced Practice Providers (APPs -  Physician Assistants and Nurse Practitioners) who all work together to provide you with the care you need, when you need it.  Special Instructions:  . Stay safe, stay home, wash your hands for at least 20 seconds and wear a mask when out in public.  . It was good to talk with you today.    Call the [facility] office at [phone] if you have any questions, problems or concerns.

## 2019-03-08 ENCOUNTER — Other Ambulatory Visit: Payer: Self-pay | Admitting: Pulmonary Disease

## 2019-03-19 NOTE — Progress Notes (Signed)
CARDIOLOGY OFFICE NOTE  Date:  03/24/2019    [name] Date of Birth: 1924-08-27 Medical Record [id number]  PCP:  [name], NP  Cardiologist:  Gillian Shields  Chief Complaint  Patient presents with  . Follow-up    History of Present Illness: [name] is a 85 y.o. female who presents today for a one month check. Seen for Dr. Johnsie Cancel.Has primarily followed with me over the past several years.  She has ahistory of labile HTN, HLD, hypothyroidism, CVA (left parietal in Dec. 2015), carotid artery diease w/high-grade left ICA stenosis with plaque ulceration, and pulmonary nodule.  Shehas beenfollowed by vascular surgery (previouslyDr. Bridgett Larsson) and was scheduled for cerebral angiogram. Doppler evaluation suggested a long LICA stenosis [id number]. She became concerned about the risk of stroke and asked to have the procedure postponed until after cardiac evaluation. She was seen by Dr. Johnsie Cancel on 10/03/15 for pre operative clearance for possible LCEA.She was started on labetalol and her Norvasc was increased for better blood pressure control.  She took her first does of labetalol the following morning and shortly after began to feel weak, flushed and became very hot and had an episode of syncope. EMS was called and she was admitted 3/29-3/30/17 for work up. Her symptoms gradually improved. She was felt to be very sensitive to the labetalol with hypotension and bradycardia and now it is listed as an allergy. She had a Myoview during her admission on 10/03/15 which was low risk without scar or ischemia, EF normal. She was cleared for vascular surgery. It was felt that she should proceed with surgery as soon as possible. Diuretic therapy was discontinued due to mild renal insufficiency. She was discharged on amlodipine only for BP control.   Subsequentmultiplephone notes revealingthat she was having issues with BPs and trialed on hydralazine and clonidine. She did not  tolerate the clonidine and this was discontinued after an ER visit.Her anxiety has played a big role in her BPP management.  Planwasfor L CEA on May of 2017- looks like this complicated by a vocal cord paralysis and "with possible TE from extensive aortic arch atherosclerosis".She wasreluctant to start anticoagulation. She elected to take ASA and Plavix instead.But then was admitted withprofound anemia in the setting of aspirin and Plavix - transfused - had EGD and noted large AVM that was clipped. She is to stay just on aspirin despite being at risk for embolism from the aortic arch atherosclerosis.  I have seen her several timessince- she has tended to be very fixated on past events. Has done ok from our standpoint. Does not check her BP any more. She has had several COPD exacerbations over the past year. She has had long standing daily alcohol use. Noted that she had seen pulmonary and had had a discussion about DNR status - she was thinking that over. I saw her last month - she was on chronic steroid therapy and having the usual skin bruising/fragility. She had had some increase in swelling in her legs and had been given HCTZ by her PCP. She was also on high dose Norvasc which I felt was contributing - this was cut back.   The patient does not have symptoms concerning for COVID-19 infection (fever, chills, cough, or new shortness of breath).   Comes in today. Here with her daughter. There was a little frustration with the screening downstairs - daughter does need to come up with her. Ms. Kokes notes that the staying at home is  taking a toll - she was a pretty social person - now no longer playing bridge, seeing friends. Her swelling has improved with the reduction in the Norvasc. Goes down to normal over night. Asking for a flu shot. No chest pain. Her breathing has been a bit worse over the past few days - using more nebulizers which she has not had to do over the past several  months since being on chronic steroid therapy. No chest pain. Using a cane. She does not have a way to check her BP at home - does not plan on checking it either. She admits she has forgotten some doses of the HCTZ and thinks that on the days she does take this that her BP is too low - not quite pre syncopal but says "it just seems low". She is having more issues with her eyes/vision which has impaired her ability to read. She is actually wearing a coat today. Daughter is in shorts.    Past Medical History:  Diagnosis Date  . Anxiety   . Aortic arch atherosclerosis ([facility]) 06/24/2014  . Atherosclerotic ulcer of aorta ([facility]) 06/24/2014  . Carotid artery occlusion   . [place] DISEASE, LUMBAR 12/16/2008  . DIVERTICULOSIS, COLON 09/30/2008  . DYSPNEA 07/13/2008  . Graves disease   . History of embolic stroke [id number]   Left brain  . HYPERLIPIDEMIA 03/06/2007  . HYPERTENSION 03/06/2007  . HYPOTHYROIDISM 10/13/2007  . OSTEOARTHRITIS 03/06/2007  . Personal history of colonic polyps 09/30/2008  . Stroke ([facility])    06/2014           . TOBACCO USE, QUIT 04/12/2009  . WEAKNESS 11/09/2007    Past Surgical History:  Procedure Laterality Date  . CARDIAC CATHETERIZATION    . CATARACT EXTRACTION Bilateral   . CHOLECYSTECTOMY    . ENDARTERECTOMY Left 11/13/2015   Procedure: LEFT CAROTID ARTERY ENDARTERECTOMY;  Surgeon: Conrad Branford, MD;  Location: [facility];  Service: Vascular;  Laterality: Left;  . ESOPHAGOGASTRODUODENOSCOPY (EGD) WITH PROPOFOL N/A 10/11/2016   Procedure: ESOPHAGOGASTRODUODENOSCOPY (EGD) WITH PROPOFOL;  Surgeon: Mauri Pole, MD;  Location: [facility] END[facility]COPY;  Service: Endoscopy;  Laterality: N/A;  . KNEE SURGERY    . PATCH ANGIOPLASTY Left 11/13/2015   Procedure: WITH 1CM X 6CM  XENOSURE BIOLOGIC PATCH ANGIOPLASTY;  Surgeon: Conrad Wanatah, MD;  Location: [facility];  Service: Vascular;  Laterality: Left;  . TEE WITHOUT CARDIOVERSION N/A 06/24/2014   Procedure: TRANSESOPHAGEAL ECHOCARDIOGRAM (TEE);  Surgeon:  Sanda Klein, MD;  Location: [facility] ENDOSCOPY;  Service: Cardiovascular;  Laterality: N/A;     Medications: Current Meds  Medication Sig  . albuterol (PROAIR HFA) 108 (90 Base) MCG/ACT inhaler Inhale 2 puffs into the lungs every 6 (six) hours as needed for wheezing or shortness of breath.  [name] Kitchen amLODipine (NORVASC) 10 MG tablet Take 0.5 tablets (5 mg total) by mouth daily.  [name] Kitchen aspirin EC 81 MG tablet Take one tablet by mouth 5 days per week  . atorvastatin (LIPITOR) 80 MG tablet TAKE 1 TABLET BY MOUTH EVERY DAY AT 6PM  . Glycerin-Hypromellose-PEG 400 ([employer] DRY EYE RELIEF) 0.2-0.2-1 % SOLN Place 1 drop into both eyes as needed (dry eyes).   . hydrALAZINE (APRESOLINE) 25 MG tablet Take 75 mg by mouth 3 (three) times daily.  . hydrochlorothiazide (HYDRODIURIL) 25 MG tablet Take 12.5 mg by mouth every other day.  . ipratropium-albuterol (DUONEB) 0.5-2.5 (3) MG/3ML SOLN Take 3 mLs by nebulization every 6 (six) hours as needed.  [name] Kitchen levothyroxine (SYNTHROID, LEVOTHROID)  75 MCG tablet Take 1 tablet (75 mcg total) by mouth daily before breakfast.  . Lidocaine 4 % PTCH Apply 1 patch topically 2 (two) times daily. (Patient taking differently: Apply 1 patch topically as needed. Right hip pain)  . predniSONE (DELTASONE) 10 MG tablet TAKE 1 TABLET (10 MG TOTAL) BY MOUTH DAILY WITH BREAKFAST.  [name] Kitchen Spacer/Aero-Holding Chambers (AEROCHAMBER MV) inhaler Use as instructed  . TRELEGY ELLIPTA 100-62.5-25 MCG/INH AEPB INHALE 1 PUFF INTO THE LUNGS EVERY DAY     Allergies: Allergies  Allergen Reactions  . Catapres [Clonidine Hcl] Other (See Comments)    Made pt feel horrible, shaky, weak and nausea  . Lisinopril Anaphylaxis    Tongue swelling  . Labetalol Hcl Other (See Comments)    Caused bradycardia and syncope    Social History: The patient  reports that she quit smoking about 40 years ago. Her smoking use included cigarettes. She started smoking about 69 years ago. She has a 43.50 pack-year smoking history. She  has never used smokeless tobacco. She reports current alcohol use of about 1.0 standard drinks of alcohol per week. She reports that she does not use drugs.   Family History: The patient's family history includes Stomach cancer in her maternal grandmother.   Review of Systems: Please see the history of present illness.   All other systems are reviewed and negative.   Physical Exam: VS:  BP (!) 146/62   Pulse (!) 112   Ht 6' (1.829 m)   Wt 139 lb 12.8 oz (63.4 kg)   SpO2 93%   BMI 18.96 kg/m  .  BMI Body mass index is 18.96 kg/m.  Wt Readings from Last 3 Encounters:  03/24/19 139 lb 12.8 oz (63.4 kg)  02/16/19 137 lb 1.9 oz (62.2 kg)  02/03/19 139 lb (63 kg)    General: Pleasant. Elderly. Alert and in no acute distress.  She did get short of breath with ambulation into the exam room today. She has a coat on.  HEENT: Normal.  Neck: Supple, no JVD, carotid bruits, or masses noted.  Cardiac: Heart tones are distant. Occasional ectopic noted but regular overall. HR is 92 by my count. Only trace edema today.  Respiratory:  Decreased breath sounds with mild increased work of breathing.  GI: Soft and nontender.  MS: No deformity or atrophy. Gait and ROM intact. Using a cane.  Skin: Warm and dry. Color is normal.  Neuro:  Strength and sensation are intact and no gross focal deficits noted.  Psych: Alert, appropriate and with normal affect.   LABORATORY DATA:  EKG:  EKG is not ordered today.  Lab Results  Component Value Date   WBC 8.0 01/26/2019   HGB 13.5 01/26/2019   HCT 40.2 01/26/2019   PLT 196.0 01/26/2019   GLUCOSE 97 01/26/2019   CHOL 148 01/13/2018   TRIG 31 01/13/2018   HDL 76 01/13/2018   LDLDIRECT 141.7 05/01/2011   LDLCALC 66 01/13/2018   ALT 47 (H) 09/02/2018   AST 29 09/02/2018   NA 141 01/26/2019   K 4.1 01/26/2019   CL 105 01/26/2019   CREATININE 1.19 01/26/2019   BUN 22 01/26/2019   CO2 27 01/26/2019   TSH 0.616 03/02/2018   INR 0.93 08/17/2018    HGBA1C 6.0 01/26/2019     BNP (last 3 results) No results for input(s): BNP in the last 8760 hours.  ProBNP (last 3 results) No results for input(s): PROBNP in the last 8760 hours.   Other  Studies Reviewed Today:  EchoStudy Conclusions7/2019  - Left ventricle: The cavity size was normal. There was moderate concentric hypertrophy. Systolic function was normal. The estimated ejection fraction was in the range of 55% to 60%. Wall motion was normal; there were no regional wall motion abnormalities. - Aortic valve: Valve mobility was mildly restricted. There was mild stenosis. There was trivial regurgitation. Valve area (VTI): 1.53 cm^2. Valve area (Vmean): 1.42 cm^2. - Atrial septum: There was increased thickness of the septum, consistent with lipomatous hypertrophy.  Impressions:  - Normal LV ejection fraction, mild aortic stenosis.   Nm Myocar Multi W/spect W/wall Motion / Ef 10/05/2015   There was no ST segment deviation noted during stress.  Defect 1: There is a medium defect of mild severity present in the mid anteroseptal, mid inferoseptal and apical septal location.  This is a low risk study.  The study is normal.  The left ventricular ejection fraction is normal (55-65%).  Nuclear stress EF: 57%. Low risk stress nuclear study with LBBB-related perfusion artifact, otherwise normal perfusion and normal left ventricular regional and global systolic function.   CTA Neck (10/09/15) Advanced atherosclerotic plaque of the thoracic aorta and distal aortic arch.  50% diameter stenosis proximal right internal carotid artery  Irregular plaque of the distal left common carotid artery with plaque ulceration. Very irregular plaque involving the proximal left internal carotid artery. Critical 85% stenosis of the proximal left internal carotid artery  Mild atherosclerotic disease in the right vertebral artery without significant vertebral artery  stenosis.  Heterogeneous thyroid bilaterally with 11 mm right lower pole nodule. Recommend thyroid ultrasound.    Assessment/Plan:  1.Labile HTN: BP recheck by me is down to 140/80. She admits she is missing doses of HCTZ but feels on the days she takes this, that her BP is too low. She is agreeable to taking every other day. BMET today. Her swelling has improved with the reduction of the Norvasc.  2.Carotid artery diease w/high-grade left ICA stenosis with plaque ulceration:s/p CEA 0000000 whichwas complicated by a left vocal cord paralysis. Noted severe atherosclerotic burden in the arch with concerns for possible embolism.She is followed by [facility].Not discussed today.   3.HLD:on high dose statin therapy- no recent lipids - we will check today.   4. COPD/Pulmonary nodule(noted to be likely benign):now on chronic steroid therapy and this has helped. She was to see pulmonary back in May - most likely postponed due to COVID - daughter will call to arrange.   5.Hypothyroidism:checking TSH today - she has chronic cold intolerance apparently.   6.Previous CVA: Continue aspirin and statin.  7.Anxiety:always an issue -this seems better today.Not discussed today.   8. Swelling-see #1. Improved.   9. Advanced age- slowing down - probably as a result of COVID.   10. Daily alcohol use- I do not see this changing. Admits to just one glass of wine a night. We did not discuss this today.   67. COVID-19 Education: The signs and symptoms of COVID-19 were discussed with the patient and how to seek care for testing (follow up with PCP or arrange E-visit).  The importance of social distancing, staying at home, hand hygiene and wearing a mask when out in public were discussed today.  Current medicines are reviewed with the patient today.  The patient does not have concerns regarding medicines other than what has been noted above.  The following changes have been made:   See above.  Labs/ tests ordered today include:    Orders  Placed This Encounter  Procedures  . Basic Metabolic Panel (BMET)  . TSH  . Hepatic function panel  . Lipid panel     Disposition:   FU with me in a few months.    Patient is agreeable to this plan and will call if any problems develop in the interim.   SignedTruitt Merle, NP  03/24/2019 3:28 PM  [facility] [address] [place], [place]  [address] Phone: [phone] Fax: [phone]

## 2019-03-24 ENCOUNTER — Ambulatory Visit (INDEPENDENT_AMBULATORY_CARE_PROVIDER_SITE_OTHER): Payer: Medicare Other | Admitting: Nurse Practitioner

## 2019-03-24 ENCOUNTER — Other Ambulatory Visit: Payer: Self-pay

## 2019-03-24 ENCOUNTER — Encounter: Payer: Self-pay | Admitting: Nurse Practitioner

## 2019-03-24 VITALS — BP 146/62 | HR 112 | Ht 72.0 in | Wt 139.8 lb

## 2019-03-24 DIAGNOSIS — E039 Hypothyroidism, unspecified: Secondary | ICD-10-CM

## 2019-03-24 DIAGNOSIS — E7800 Pure hypercholesterolemia, unspecified: Secondary | ICD-10-CM

## 2019-03-24 DIAGNOSIS — I10 Essential (primary) hypertension: Secondary | ICD-10-CM

## 2019-03-24 DIAGNOSIS — Z7189 Other specified counseling: Secondary | ICD-10-CM

## 2019-03-24 DIAGNOSIS — Z8673 Personal history of transient ischemic attack (TIA), and cerebral infarction without residual deficits: Secondary | ICD-10-CM

## 2019-03-24 DIAGNOSIS — R609 Edema, unspecified: Secondary | ICD-10-CM

## 2019-03-24 DIAGNOSIS — Z23 Encounter for immunization: Secondary | ICD-10-CM | POA: Diagnosis not present

## 2019-03-24 DIAGNOSIS — R0989 Other specified symptoms and signs involving the circulatory and respiratory systems: Secondary | ICD-10-CM

## 2019-03-24 NOTE — Patient Instructions (Addendum)
After Visit Summary:  We will be checking the following labs today - Lipids, LFTs, BMET & TSH   Medication Instructions:    Continue with your current medicines. BUT  Let's try the HCTZ just every other day.  Stay on the half dose of Amlodipine   If you need a refill on your cardiac medications before your next appointment, please call your pharmacy.     Testing/Procedures To Be Arranged:  N/A  Follow-Up:   See me in about 4 months  Call and get an appointment with pulmonary    At [facility], you and your health needs are our priority.  As part of our continuing mission to provide you with exceptional heart care, we have created designated Provider Care Teams.  These Care Teams include your primary Cardiologist (physician) and Advanced Practice Providers (APPs -  Physician Assistants and Nurse Practitioners) who all work together to provide you with the care you need, when you need it.  Special Instructions:  . Stay safe, stay home, wash your hands for at least 20 seconds and wear a mask when out in public.  . It was good to talk with you today.    Call the [place] office at [phone] if you have any questions, problems or concerns.

## 2019-03-25 LAB — LIPID PANEL
Chol/HDL Ratio: 2 ratio (ref 0.0–4.4)
Cholesterol, Total: 218 mg/dL — ABNORMAL HIGH (ref 100–199)
HDL: 111 mg/dL (ref >39)
LDL Chol Calc (NIH): 94 mg/dL (ref 0–99)
Triglycerides: 73 mg/dL (ref 0–149)
VLDL Cholesterol Cal: 13 mg/dL (ref 5–40)

## 2019-03-25 LAB — BASIC METABOLIC PANEL
BUN/Creatinine Ratio: 21 (ref 12–28)
BUN: 22 mg/dL (ref 10–36)
CO2: 22 mmol/L (ref 20–29)
Calcium: 9 mg/dL (ref 8.7–10.3)
Chloride: 103 mmol/L (ref 96–106)
Creatinine, Ser: 1.03 mg/dL — ABNORMAL HIGH (ref 0.57–1.00)
GFR calc Af Amer: 55 mL/min/{1.73_m2} — ABNORMAL LOW (ref >59)
GFR calc non Af Amer: 48 mL/min/{1.73_m2} — ABNORMAL LOW (ref >59)
Glucose: 183 mg/dL — ABNORMAL HIGH (ref 65–99)
Potassium: 4.1 mmol/L (ref 3.5–5.2)
Sodium: 141 mmol/L (ref 134–144)

## 2019-03-25 LAB — HEPATIC FUNCTION PANEL
ALT: 21 IU/L (ref 0–32)
AST: 19 IU/L (ref 0–40)
Albumin: 4.3 g/dL (ref 3.5–4.6)
Alkaline Phosphatase: 78 IU/L (ref 39–117)
Bilirubin Total: 0.7 mg/dL (ref 0.0–1.2)
Bilirubin, Direct: 0.22 mg/dL (ref 0.00–0.40)
Total Protein: 6.3 g/dL (ref 6.0–8.5)

## 2019-03-25 LAB — TSH: TSH: 1.28 u[IU]/mL (ref 0.450–4.500)

## 2019-04-03 ENCOUNTER — Other Ambulatory Visit: Payer: Self-pay | Admitting: Adult Health

## 2019-04-03 DIAGNOSIS — M79604 Pain in right leg: Secondary | ICD-10-CM

## 2019-04-03 DIAGNOSIS — I872 Venous insufficiency (chronic) (peripheral): Secondary | ICD-10-CM

## 2019-04-04 ENCOUNTER — Other Ambulatory Visit: Payer: Self-pay | Admitting: Pulmonary Disease

## 2019-04-07 ENCOUNTER — Other Ambulatory Visit: Payer: Self-pay

## 2019-04-07 ENCOUNTER — Encounter: Payer: Self-pay | Admitting: Pulmonary Disease

## 2019-04-07 ENCOUNTER — Ambulatory Visit (INDEPENDENT_AMBULATORY_CARE_PROVIDER_SITE_OTHER): Payer: Medicare Other | Admitting: Pulmonary Disease

## 2019-04-07 VITALS — BP 142/78 | HR 77 | Temp 97.7°F | Ht 66.0 in | Wt 141.0 lb

## 2019-04-07 DIAGNOSIS — J449 Chronic obstructive pulmonary disease, unspecified: Secondary | ICD-10-CM | POA: Diagnosis not present

## 2019-04-07 DIAGNOSIS — I6523 Occlusion and stenosis of bilateral carotid arteries: Secondary | ICD-10-CM

## 2019-04-07 MED ORDER — TRELEGY ELLIPTA 100-62.5-25 MCG/INH IN AEPB: 1.0000 | INHALATION_SPRAY | Freq: Every day | RESPIRATORY_TRACT | 5 refills | Status: DC

## 2019-04-07 NOTE — Addendum Note (Signed)
Addended by: Hildred Alamin I on: 04/07/2019 11:50 AM   Modules accepted: Orders

## 2019-04-07 NOTE — Progress Notes (Signed)
[name]    IP:[id number]    06-Jun-1925  Primary Care Physician:Nafziger, Tommi Rumps, NP  Referring Physician: [name], NP [place],  [place] [address]  Chief complaint:  Follow up for  COPD Gold D Vocal cord paralysis  HPI: [name] is a 85 year old with complaints of dyspnea on exertion. The problem started in May 2017 after a carotid artery end arterectomy. This procedure was complicated by left vocal cord paralysis, hoarseness.  Also has COPD Gold D, mMRC 3 with multiple exacerbations.  Multiple exacerbations and hospitalizations over the past few years for COPD. She was hospitalized in the emergency room in October 2017 for COPD exacerbation. She was discharged on a prednisone taper and amoxicillin. Records from this hospitalization reviewed below in data section Hospitalized in April 2018 with severe anemia, bleeding AVMs.  Underwent EGD with cauterization, clipping of bleeding lesion. Hospitalized for 2 days in July 2019 for mild COPD exacerbation. Seen in the ED on February 2020 with wheezing, dyspnea improved with 1 dose of Solu-Medrol and nebs  Interim History: Continues on prednisone 10 mg a day.  She states that this really helps with her breathing. Has problems with skin bruising. Continues on Trelegy inhaler. Continues on Trelegy.  . Outpatient Encounter Medications as of 04/07/2019  Medication Sig  . albuterol (PROAIR HFA) 108 (90 Base) MCG/ACT inhaler Inhale 2 puffs into the lungs every 6 (six) hours as needed for wheezing or shortness of breath.  [name] Kitchen amLODipine (NORVASC) 10 MG tablet Take 0.5 tablets (5 mg total) by mouth daily.  [name] Kitchen aspirin EC 81 MG tablet Take one tablet by mouth 5 days per week  . atorvastatin (LIPITOR) 80 MG tablet TAKE 1 TABLET BY MOUTH EVERY DAY AT 6PM  . Glycerin-Hypromellose-PEG 400 ([employer] DRY EYE RELIEF) 0.2-0.2-1 % SOLN Place 1 drop into both eyes as needed (dry eyes).   . hydrALAZINE (APRESOLINE) 25 MG  tablet Take 75 mg by mouth 3 (three) times daily.  . hydrochlorothiazide (HYDRODIURIL) 25 MG tablet Take 1 tablet (25 mg total) by mouth every other day.  . ipratropium-albuterol (DUONEB) 0.5-2.5 (3) MG/3ML SOLN Take 3 mLs by nebulization every 6 (six) hours as needed.  . latanoprost (XALATAN) 0.005 % ophthalmic solution Place 1 drop into both eyes at bedtime.   [name] Kitchen levothyroxine (SYNTHROID, LEVOTHROID) 75 MCG tablet Take 1 tablet (75 mcg total) by mouth daily before breakfast.  . Lidocaine 4 % PTCH Apply 1 patch topically 2 (two) times daily. (Patient taking differently: Apply 1 patch topically as needed. Right hip pain)  . predniSONE (DELTASONE) 10 MG tablet TAKE 1 TABLET (10 MG TOTAL) BY MOUTH DAILY WITH BREAKFAST.  [name] Kitchen Spacer/Aero-Holding Chambers (AEROCHAMBER MV) inhaler Use as instructed  . TRELEGY ELLIPTA 100-62.5-25 MCG/INH AEPB INHALE 1 PUFF INTO THE LUNGS EVERY DAY *MUST HAVE APPOINTMENT FOR REFILLS*   No facility-administered encounter medications on file as of 04/07/2019.    Physical Exam: Blood pressure (!) 142/78, pulse 77, temperature 97.7 F (36.5 C), temperature source Temporal, height 5\' 6"  (1.676 m), weight 141 lb (64 kg), SpO2 97 %. Gen:      No acute distress HEENT:  EOMI, sclera anicteric Neck:     No masses; no thyromegaly Lungs:    Clear to auscultation bilaterally; normal respiratory effort CV:         Regular rate and rhythm; no murmurs Abd:      + bowel sounds; soft, non-tender; no palpable masses, no  distension Ext:    No edema; adequate peripheral perfusion Skin:      Warm and dry; no rash Neuro: alert and oriented x 3 Psych: normal mood and affect  Data Reviewed: Imnaging CT scan chest 11/26/12- Right lingular opacity, scattered sub cm pulmonary nodules, images reviewed.  CT 04/05/16- Stable pulmonary nodules Severe COPD. Chest x-ray 01/12/2018- hyperinflation, no active pulmonary disease.  Stable 7 mm opacity in the left midlung. Chest x-ray 08/17/2018-  hyperinflation, chronic nodular density over left lung. I reviewed the images personally.  PFTs 04/02/16 FVC 2.54 (107%), FEV1 1.43 (82%), F/F 56, TLC 107%, DLCO 42% Mod obstructive lung disease with DLCO impairment.  No bronchodilator response  Assessment: Moderate COPD. Although she has moderate obstruction on PFTs her COPD is likely severe based on lung imaging and DLCO impairment.  Currently on Trelegy inhaler  She has not tolerated coming off prednisone and frequency of exacerbations have decreased with a daily dose of 10 mg.  But has skin bruising with the steroids I will try to reduce her dose to 5 mg a day and see how she does. We discussed use of Daliresp but decided to hold off as she does not want to deal with possible side effects.  Lung nodule Stable on repeat CT from 2014 to 2017. Likely benign.  Goals of care Initiated discussion about goals of care given frailty, age and recurrent exacerbations She tells me that she wants short-term life support but does not want to be kept on the ventilator for long. I encouraged her to consider DNR status.  She wants to think about it Continue discussions going forward.  Health maintenance Up-to-date with flu and Pneumovax.  Plan/Recommendations: - Continue Trelegy, duo nebs as needed - Taper prednisone to 5 mg. - Use incentive spirometer and flutter valve 3 times/ day.  Marshell Garfinkel MD [facility] 04/07/2019, 11:26 AM  CC: [name], NP

## 2019-04-07 NOTE — Patient Instructions (Signed)
Glad you are doing well with regard to your breathing Reduce the prednisone to 5 mg a day We will renew the Trelegy inhaler Follow-up in 3 months.

## 2019-04-12 ENCOUNTER — Encounter: Payer: Self-pay | Admitting: Family

## 2019-04-12 ENCOUNTER — Other Ambulatory Visit: Payer: Self-pay

## 2019-04-12 ENCOUNTER — Ambulatory Visit (HOSPITAL_COMMUNITY)
Admission: RE | Admit: 2019-04-12 | Discharge: 2019-04-12 | Disposition: A | Discharge status: Home / Self Care | Payer: Medicare Other | Source: Ambulatory Visit | Attending: Family | Admitting: Family

## 2019-04-12 ENCOUNTER — Ambulatory Visit (INDEPENDENT_AMBULATORY_CARE_PROVIDER_SITE_OTHER): Payer: Medicare Other | Admitting: Family

## 2019-04-12 VITALS — BP 150/77 | HR 94 | Temp 97.6°F | Resp 20 | Ht 66.0 in | Wt 138.0 lb

## 2019-04-12 DIAGNOSIS — I6523 Occlusion and stenosis of bilateral carotid arteries: Secondary | ICD-10-CM | POA: Diagnosis not present

## 2019-04-12 DIAGNOSIS — Z9889 Other specified postprocedural states: Secondary | ICD-10-CM | POA: Diagnosis not present

## 2019-04-12 NOTE — Progress Notes (Signed)
Chief Complaint: Follow up Extracranial Carotid Artery Stenosis   History of Present Illness  [name] is a 85 y.o. female who is s/p left carotid endarterectomy on 11/13/15 by Dr. Bridgett Larsson for left symptomatic carotid stenosis >80%.  Dr. Bridgett Larsson last evaluated pt on 10-22-17. At that time pt remained asymptomatic and carotid duplex showed 1-39% right ICA stenosis and mild hyperplasia in the left ICA.   Previous carotid studies demonstrated: RICA 123456 stenosis, LICA 123456 stenosis.  Patient has history of TIA: right hand weakness and paresthesia.  Patient was also found to have L parietal stroke in Dec 2015, felt due to aortic arch embolization.    The patient has never had amaurosis fugax or monocular blindness.  The patient has never had unilateral facial drooping or hemiplegia.  The patient has never had receptive or expressive aphasia.  The patient's previous neurologic deficits have resolved.  Pt states that she is aware of her heart murmur. She denies chest pain, states she has COPD and that her dyspnea is no worse than usual.    Diabetic: no Tobacco use: former smoker, quit in 1980, started in 1951  Pt meds include: Statin : yes ASA: yes, 5 days/week Other anticoagulants/antiplatelets: no   Past Medical History:  Diagnosis Date  . Anxiety   . Aortic arch atherosclerosis ([place]) 06/24/2014  . Atherosclerotic ulcer of aorta ([place]) 06/24/2014  . Carotid artery occlusion   . [place] DISEASE, LUMBAR 12/16/2008  . DIVERTICULOSIS, COLON 09/30/2008  . DYSPNEA 07/13/2008  . Graves disease   . History of embolic stroke [id number]   Left brain  . HYPERLIPIDEMIA 03/06/2007  . HYPERTENSION 03/06/2007  . HYPOTHYROIDISM 10/13/2007  . OSTEOARTHRITIS 03/06/2007  . Personal history of colonic polyps 09/30/2008  . Stroke ([place])    06/2014           . TOBACCO USE, QUIT 04/12/2009  . WEAKNESS 11/09/2007    Social History Social History   Tobacco Use  . Smoking status: Former Smoker     Packs/day: 1.50    Years: 29.00    Pack years: 43.50    Types: Cigarettes    Start date: 07/08/1949    Quit date: 07/08/1978    Years since quitting: 40.7  . Smokeless tobacco: Never Used  Substance Use Topics  . Alcohol use: Yes    Alcohol/week: 1.0 standard drinks    Types: 1 Glasses of wine per week    Comment: "red wine"- some nights  . Drug use: No    Family History Family History  Problem Relation Age of Onset  . Stomach cancer Maternal Grandmother   . Colon cancer Neg Hx     Surgical History Past Surgical History:  Procedure Laterality Date  . CARDIAC CATHETERIZATION    . CATARACT EXTRACTION Bilateral   . CHOLECYSTECTOMY    . ENDARTERECTOMY Left 11/13/2015   Procedure: LEFT CAROTID ARTERY ENDARTERECTOMY;  Surgeon: Conrad [place]ville, MD;  Location: [facility];  Service: Vascular;  Laterality: Left;  . ESOPHAGOGASTRODUODENOSCOPY (EGD) WITH PROPOFOL N/A 10/11/2016   Procedure: ESOPHAGOGASTRODUODENOSCOPY (EGD) WITH PROPOFOL;  Surgeon: Mauri Pole, MD;  Location: [facility];  Service: Endoscopy;  Laterality: N/A;  . KNEE SURGERY    . PATCH ANGIOPLASTY Left 11/13/2015   Procedure: WITH 1CM X 6CM  XENOSURE BIOLOGIC PATCH ANGIOPLASTY;  Surgeon: Conrad Searcy, MD;  Location: [facility];  Service: Vascular;  Laterality: Left;  . TEE WITHOUT CARDIOVERSION N/A 06/24/2014   Procedure: TRANSESOPHAGEAL ECHOCARDIOGRAM (TEE);  Surgeon: Dani Gobble  Croitoru, MD;  Location: [facility];  Service: Cardiovascular;  Laterality: N/A;    Allergies  Allergen Reactions  . Catapres [Clonidine Hcl] Other (See Comments)    Made pt feel horrible, shaky, weak and nausea  . Lisinopril Anaphylaxis    Tongue swelling  . Labetalol Hcl Other (See Comments)    Caused bradycardia and syncope    Current Outpatient Medications  Medication Sig Dispense Refill  . albuterol (PROAIR HFA) 108 (90 Base) MCG/ACT inhaler Inhale 2 puffs into the lungs every 6 (six) hours as needed for wheezing or shortness of breath. 8.5  Inhaler 5  . amLODipine (NORVASC) 10 MG tablet Take 0.5 tablets (5 mg total) by mouth daily. 90 tablet 3  . aspirin EC 81 MG tablet Take one tablet by mouth 5 days per week 90 tablet 3  . atorvastatin (LIPITOR) 80 MG tablet TAKE 1 TABLET BY MOUTH EVERY DAY AT 6PM 90 tablet 1  . Fluticasone-Umeclidin-Vilant (TRELEGY ELLIPTA) 100-62.5-25 MCG/INH AEPB Inhale 1 puff into the lungs daily. 60 each 5  . Glycerin-Hypromellose-PEG 400 ([facility] DRY EYE RELIEF) 0.2-0.2-1 % SOLN Place 1 drop into both eyes as needed (dry eyes).     . hydrALAZINE (APRESOLINE) 25 MG tablet Take 75 mg by mouth 3 (three) times daily.    . hydrochlorothiazide (HYDRODIURIL) 25 MG tablet Take 1 tablet (25 mg total) by mouth every other day. 45 tablet 1  . ipratropium-albuterol (DUONEB) 0.5-2.5 (3) MG/3ML SOLN Take 3 mLs by nebulization every 6 (six) hours as needed. 360 mL 3  . latanoprost (XALATAN) 0.005 % ophthalmic solution Place 1 drop into both eyes at bedtime.     [name] Kitchen levothyroxine (SYNTHROID, LEVOTHROID) 75 MCG tablet Take 1 tablet (75 mcg total) by mouth daily before breakfast. 90 tablet 3  . Lidocaine 4 % PTCH Apply 1 patch topically 2 (two) times daily. (Patient taking differently: Apply 1 patch topically as needed. Right hip pain) 12 patch 0  . predniSONE (DELTASONE) 10 MG tablet TAKE 1 TABLET (10 MG TOTAL) BY MOUTH DAILY WITH BREAKFAST. (Patient taking differently: Take 5 mg by mouth daily with breakfast. ) 30 tablet 3  . Spacer/Aero-Holding Chambers (AEROCHAMBER MV) inhaler Use as instructed 1 each 0   No current facility-administered medications for this visit.     Review of Systems : See HPI for pertinent positives and negatives.  Physical Examination  Vitals:   04/12/19 1144 04/12/19 1148  BP: (!) 182/80 (!) 150/77  Pulse: 90 94  Resp: 20   Temp: 97.6 F (36.4 C)   TempSrc: Temporal   SpO2: 96%   Weight: 138 lb (62.6 kg)   Height: 5\' 6"  (1.676 m)    Body mass index is 22.27 kg/m.  General: WDWN elderly  female in NAD, accompanied by her daughter GAIT: steady, slow Eyes: issue with left eye cataract or IOL (per daughter) HENT: No gross abnormalities.  Pulmonary:  Respirations are non-labored at rest, adequate air movement in all fields, no rales, rhonchi, or wheezes Cardiac: Irregular rhythm with controlled rate, + murmur.  VASCULAR EXAM Carotid Bruits Right Left   Negative Negative     Abdominal aortic pulse is not palpable. Radial pulses are 2+ palpable and equal.  LE Pulses Right Left       POPLITEAL  not palpable   not palpable       POSTERIOR TIBIAL   palpable    palpable        DORSALIS PEDIS      ANTERIOR TIBIAL  palpable   palpable     Gastrointestinal: soft, nontender, BS WNL, no r/g, no palpable masses. Musculoskeletal: no muscle atrophy/wasting. M/S 4/5 throughout, extremities without ischemic changes Skin: No rashes, no ulcers, no cellulitis. Several small bruises on forearms and legs (pt on prednisone)   Neurologic:  A&O X 3; appropriate affect, sensation is normal; speech is normal, CN 2-12 intact, pain and light touch intact in extremities, motor exam as listed above. Psychiatric: Normal thought content, mood appropriate to clinical situation.    Assessment: [name] is a 85 y.o. female who is s/p left carotid endarterectomy on 11/13/15 by Dr. Bridgett Larsson for left symptomatic carotid stenosis >80%.  Patient was also found to have L parietal stroke in Dec 2015, felt due to aortic arch embolization.    Carotid duplex today shows 1-39% bilateral ICA stenosis.   Her atherosclerotic risk factors include 29 year history of smoking 1.5 ppd (quit in 1980), hypertension, and dyslipidemia. Fortunately she does not have DM.  She takes a daily statin and 81 mg ASA 5 days/week.   Cardiac arrhythmia: pt saw her cardiologist last month and states she is  scheduled to return in January 2021. She denies feeling any different than usual.     DATA  Carotid Duplex (04-12-19): Right Carotid: Velocities in the right ICA are consistent with a 1-39% stenosis.                Non-hemodynamically significant plaque <50% noted in the CCA. The                ECA appears <50% stenosed. Left Carotid: Velocities in the left ICA are consistent with a 1-39% stenosis.               Non-hemodynamically significant plaque <50% noted in the CCA. The               ECA appears <50% stenosed. Patent left carotid endarterectomy with               non-hemodynamically significant plaque noted. Vertebrals:  Bilateral vertebral arteries demonstrate antegrade flow. Left              vertebral artery systolic deceleration noted. Subclavians: Left subclavian artery was stenotic. Left subclavian artery flow              was disturbed. Normal flow hemodynamics were seen in the right              subclavian artery.    Plan: Follow-up in 1 year with Carotid Duplex scan.   I discussed in depth with the patient the nature of atherosclerosis, and emphasized the importance of maximal medical management including strict control of blood pressure, blood glucose, and lipid levels, obtaining regular exercise, and continued cessation of smoking.  The patient is aware that without maximal medical management the underlying atherosclerotic disease process will progress, limiting the benefit of any interventions. The patient was given information about stroke prevention and what symptoms should prompt the patient to seek immediate medical care. Thank you for allowing [place] to participate in this patient's care.  Clemon Chambers, RN, MSN, FNP-C [facility] Office: [phone]  Clinic Physician:  [place]  04/12/19 11:53 AM

## 2019-04-12 NOTE — Patient Instructions (Signed)

## 2019-04-28 ENCOUNTER — Other Ambulatory Visit: Payer: Self-pay | Admitting: Pulmonary Disease

## 2019-05-10 ENCOUNTER — Other Ambulatory Visit: Payer: Self-pay | Admitting: Nurse Practitioner

## 2019-06-14 ENCOUNTER — Encounter: Payer: Self-pay | Admitting: Adult Health

## 2019-06-21 ENCOUNTER — Other Ambulatory Visit: Payer: Self-pay

## 2019-06-21 ENCOUNTER — Ambulatory Visit: Payer: Medicare Other | Admitting: Pulmonary Disease

## 2019-06-21 ENCOUNTER — Encounter: Payer: Self-pay | Admitting: Pulmonary Disease

## 2019-06-21 VITALS — BP 136/80 | HR 85 | Temp 97.8°F | Ht 66.0 in | Wt 143.2 lb

## 2019-06-21 DIAGNOSIS — J449 Chronic obstructive pulmonary disease, unspecified: Secondary | ICD-10-CM

## 2019-06-21 DIAGNOSIS — R0600 Dyspnea, unspecified: Secondary | ICD-10-CM

## 2019-06-21 MED ORDER — IPRATROPIUM-ALBUTEROL 0.5-2.5 (3) MG/3ML IN SOLN: 3.0000 mL | Freq: Four times a day (QID) | RESPIRATORY_TRACT | 3 refills | Status: DC | PRN

## 2019-06-21 MED ORDER — ROFLUMILAST 500 MCG PO TABS: 500.0000 ug | ORAL_TABLET | Freq: Every day | ORAL | 11 refills | Status: DC

## 2019-06-21 MED ORDER — PREDNISONE 10 MG PO TABS: 5.0000 mg | ORAL_TABLET | Freq: Every day | ORAL | 3 refills | Status: DC

## 2019-06-21 MED ORDER — TRELEGY ELLIPTA 100-62.5-25 MCG/INH IN AEPB: 1.0000 | INHALATION_SPRAY | Freq: Every day | RESPIRATORY_TRACT | 5 refills | Status: DC

## 2019-06-21 NOTE — Patient Instructions (Addendum)
you can go up on the prednisone to 10 mg a day for a few days as you are having mild increase in dyspnea Eventually would like to start tapering it down to 5 mg and then 2.5 mg We will place an order for Daliresp and check with insurance company to see if we can get it approved  Follow-up in 3 months.

## 2019-06-21 NOTE — Progress Notes (Signed)
[name]    [id number]    01/27/1925  Primary Care Physician:Nafziger, Tommi Rumps, NP  Referring Physician: [name], NP [place],  [place] [address]  Chief complaint:  Follow up for  COPD Gold D Vocal cord paralysis  HPI: [name] is a 85 year old with complaints of dyspnea on exertion. The problem started in May 2017 after a carotid artery end arterectomy. This procedure was complicated by left vocal cord paralysis, hoarseness.  Also has COPD Gold D, mMRC 3 with multiple exacerbations.  Multiple exacerbations and hospitalizations over the past few years for COPD. She was hospitalized in the emergency room in October 2017 for COPD exacerbation. She was discharged on a prednisone taper and amoxicillin. Records from this hospitalization reviewed below in data section Hospitalized in April 2018 with severe anemia, bleeding AVMs.  Underwent EGD with cauterization, clipping of bleeding lesion. Hospitalized for 2 days in July 2019 for mild COPD exacerbation. Seen in the ED on February 2020 with wheezing, dyspnea improved with 1 dose of Solu-Medrol and nebs  Interim History: Continues on prednisone.  Down to 5 mg a day.  She states that she is doing well on a slow taper until yesterday when she had increased congestion and wheezing.  Outpatient Encounter Medications as of 06/21/2019  Medication Sig  . albuterol (PROAIR HFA) 108 (90 Base) MCG/ACT inhaler Inhale 2 puffs into the lungs every 6 (six) hours as needed for wheezing or shortness of breath.  [name] Kitchen amLODipine (NORVASC) 10 MG tablet Take 0.5 tablets (5 mg total) by mouth daily.  [name] Kitchen aspirin EC 81 MG tablet Take one tablet by mouth 5 days per week  . atorvastatin (LIPITOR) 80 MG tablet TAKE 1 TABLET BY MOUTH EVERY DAY AT 6PM  . Fluticasone-Umeclidin-Vilant (TRELEGY ELLIPTA) 100-62.5-25 MCG/INH AEPB Inhale 1 puff into the lungs daily.  . Glycerin-Hypromellose-PEG 400 ([facility] DRY EYE RELIEF) 0.2-0.2-1 %  SOLN Place 1 drop into both eyes as needed (dry eyes).   . hydrALAZINE (APRESOLINE) 25 MG tablet Take 75 mg by mouth 3 (three) times daily.  . hydrochlorothiazide (HYDRODIURIL) 25 MG tablet Take 1 tablet (25 mg total) by mouth every other day.  . ipratropium-albuterol (DUONEB) 0.5-2.5 (3) MG/3ML SOLN Take 3 mLs by nebulization every 6 (six) hours as needed.  . latanoprost (XALATAN) 0.005 % ophthalmic solution Place 1 drop into both eyes at bedtime.   [name] Kitchen levothyroxine (SYNTHROID, LEVOTHROID) 75 MCG tablet Take 1 tablet (75 mcg total) by mouth daily before breakfast.  . Lidocaine 4 % PTCH Apply 1 patch topically 2 (two) times daily. (Patient taking differently: Apply 1 patch topically as needed. Right hip pain)  . predniSONE (DELTASONE) 10 MG tablet Take 0.5 tablets (5 mg total) by mouth daily with breakfast.  . Spacer/Aero-Holding Chambers (AEROCHAMBER MV) inhaler Use as instructed   No facility-administered encounter medications on file as of 06/21/2019.   Physical Exam: Blood pressure (!) 142/78, pulse 77, temperature 97.7 F (36.5 C), temperature source Temporal, height 5\' 6"  (1.676 m), weight 141 lb (64 kg), SpO2 97 %. Gen:      No acute distress HEENT:  EOMI, sclera anicteric Neck:     No masses; no thyromegaly Lungs:    Clear to auscultation bilaterally; normal respiratory effort CV:         Regular rate and rhythm; no murmurs Abd:      + bowel sounds; soft, non-tender; no palpable masses, no distension Ext:  No edema; adequate peripheral perfusion Skin:      Warm and dry; no rash Neuro: alert and oriented x 3 Psych: normal mood and affect  Data Reviewed: Imnaging CT scan chest 11/26/12- Right lingular opacity, scattered sub cm pulmonary nodules, images reviewed.  CT 04/05/16- Stable pulmonary nodules Severe COPD. Chest x-ray 01/12/2018- hyperinflation, no active pulmonary disease.  Stable 7 mm opacity in the left midlung. Chest x-ray 08/17/2018- hyperinflation, chronic nodular  density over left lung. I reviewed the images personally.  PFTs 04/02/16 FVC 2.54 (107%), FEV1 1.43 (82%), F/F 56, TLC 107%, DLCO 42% Mod obstructive lung disease with DLCO impairment.  No bronchodilator response  Assessment: Moderate COPD. Although she has moderate obstruction on PFTs her COPD is likely severe based on lung imaging and DLCO impairment.  Currently on Trelegy inhaler  She has not tolerated coming off prednisone and frequency of exacerbations have decreased with a daily pednisone.  But has skin bruising with the steroids at higher dose Go back up to 10 mg of prednisone due to increased dyspnea Trial of Daliresp as she has not tolerated taper off steroids.  Lung nodule Stable on repeat CT from 2014 to 2017. Likely benign.  Goals of care Initiated discussion about goals of care given frailty, age and recurrent exacerbations She tells me that she wants short-term life support but does not want to be kept on the ventilator for long. I encouraged her to consider DNR status.  She wants to think about it Continue discussions going forward.  Health maintenance Up-to-date with flu and Pneumovax.  Plan/Recommendations: - Continue Trelegy, duo nebs as needed - Continue prednisone - Daliresp - Use incentive spirometer and flutter valve 3 times/ day.  Marshell Garfinkel MD [facility] 06/21/2019, 11:28 AM  CC: [name], NP

## 2019-06-24 ENCOUNTER — Telehealth: Payer: Self-pay | Admitting: Pulmonary Disease

## 2019-06-24 NOTE — Telephone Encounter (Signed)
Medication name and strength: Daliresp 542mcg Provider: Dr. Vaughan Browner [facility] [address] Patient insurance ID:  Phone: [phone] Fax:   Was the PA started on CMM?  Yes If yes, please enter the Key: [facility] Timeframe for approval/denial: Instant approval. Approved until 07/07/20.  [facility] She is aware of the approval. Nothing further needed at time of call.

## 2019-07-06 ENCOUNTER — Encounter: Payer: Self-pay | Admitting: Adult Health

## 2019-07-10 ENCOUNTER — Telehealth: Payer: Self-pay | Admitting: Pulmonary Disease

## 2019-07-10 NOTE — Telephone Encounter (Signed)
Patient's daughter Ena Dawley called answering service for mom not doing very well Symptoms have progressively worsened since tapering doses of steroids from 10 mg  Has been using Daliresp  Using nebulizer more frequently, desaturating more easily, very fatigued  She was doing well when she was on 10 mg of prednisone, did not do as well on 5 and now she is on 2.5  She was advised to increase prednisone back to 10 for about 3 days and then try to wean it back to 5  Get in touch with the office during the week  Continue using Daliresp

## 2019-07-12 ENCOUNTER — Telehealth: Payer: Self-pay | Admitting: Pulmonary Disease

## 2019-07-12 ENCOUNTER — Other Ambulatory Visit: Payer: Self-pay | Admitting: Adult Health

## 2019-07-12 NOTE — Telephone Encounter (Signed)
Spoke with the pt's daughter and notified of recs per TP  She verbalized understanding  Appt for televisit was scheduled with Aaron Edelman

## 2019-07-12 NOTE — Telephone Encounter (Signed)
Called and spoke with Gloria daughter [name].  [name] stated Dr. Vaughan Browner had decreased Gloria Prednisone and started Patient on Daliresp 06/21/19. [name] stated Patient has followed Dr. Matilde Bash instructions.  Patient has gotten worse since tapering back the Prednisone. On 07/10/2019 Patient had gotten extremely sob, and [name] was scared to leave Patient. [name] stated Patient refused EMS, or letting her drive her to the ED, for fear of Covid 19.   [name] spoke with Dr. Ander Slade who recommended  Prednisone 10mg  x's 3 days, then wean back to 5 mg, and continue Daliresp.  [name] stated Patient is better with Prednisone 10mg , and is wanting  to know if Patient should stay on 10mg .  [name] was also wanting to know if they should continue Daliresp with Prednisone 10mg ?  07/10/2019 Dr. Mellody Memos note-  Conversation (Newest Message First) Laurin Coder, MD     07/10/19 6:32 PM Note   [name] called answering service for mom not doing very well Symptoms have progressively worsened since tapering doses of steroids from 10 mg  Has been using Daliresp  Using nebulizer more frequently, desaturating more easily, very fatigued  She was doing well when she was on 10 mg of prednisone, did not do as well on 5 and now she is on 2.5  She was advised to increase prednisone back to 10 for about 3 days and then try to wean it back to 5  Get in touch with the office during the week  Continue using Daliresp     Dr. Vaughan Browner OV note 06/21/19  Instructions  you can go up on the prednisone to 10 mg a day for a few days as you are having mild increase in dyspnea Eventually would like to start tapering it down to 5 mg and then 2.5 mg We will place an order for Daliresp and check with insurance company to see if we can get it approved  Follow-up in 3 months.     Message routed to [facility], NP to advise

## 2019-07-12 NOTE — Progress Notes (Signed)
Virtual Visit via Telephone Note  I connected with [name] on 07/13/19 at 10:30 AM EST by telephone and verified that I am speaking with the correct person using two identifiers.  Location: Patient: Home Provider: Office [facility] - [id number], [place], [place], [place] [address]   I discussed the limitations, risks, security and privacy concerns of performing an evaluation and management service by telephone and the availability of in person appointments. I also discussed with the patient that there may be a patient responsible charge related to this service. The patient expressed understanding and agreed to proceed.  Patient consented to consult via telephone: Yes People present and their role in pt care: Pt   History of Present Illness: 86 year old female former smoker followed in our office for COPD Gold I D (01/2018 - PFT Fev1 - 84), emphysema (01/2018 dlco - 40)  PMH: Hypertension, chronic kidney disease stage III, hypothyroidism, osteoarthritis, stroke, left bundle branch block, hyperlipidemia Smoker/ Smoking History: Former smoker.  43.5-pack-year history. Maintenance:  Trelegy Ellipta Pt of: Dr. Vaughan Browner  Chief complaint: Struggling with pred dose, recent daliresp start   86 year old female former smoker followed in our office for COPD.  Patient has been maintained on chronic steroids for the past couple of years.  She has been working with Dr. Vaughan Browner to titrate the steroids down.  Patient was most recently titrated down to 2.5 mg about 3 to 4 weeks ago by Dr. Vaughan Browner.  She was tolerating this okay.  She did feel like she had more fatigue and she had to use her rescue inhaler more during this time.  She recently started Daliresp last week at 500 mcg full dosing.  Since then she has had increased fatigue.  She has had no energy.  As well as a poor appetite.  Patient called the after-hours line and was started on 10 mg of prednisone.  Patient denies increased cough or  wheezing.  She has to use her DuoNeb nebulizer maybe 2-3 times a week.  In the past patient had skin bruising with the 10 mg dose of prednisone.  She did tolerate the 5 mg dose of prednisone fairly well.  She did have to use her nebulizer a few more times during the week.  Observations/Objective:  CT scan chest 11/26/12- Right lingular opacity, scattered sub cm pulmonary nodules CT 04/05/16- Stable pulmonary nodules Severe COPD. Chest x-ray 01/12/2018- hyperinflation, no active pulmonary disease.  Stable 7 mm opacity in the left midlung. Chest x-ray 08/17/2018- hyperinflation, chronic nodular density over left lung.  PFTs 04/02/16 FVC 2.54 (107%), FEV1 1.43 (82%), F/F 56, TLC 107%, DLCO 42% Mod obstructive lung disease with DLCO impairment.  No bronchodilator response   Social History   Tobacco Use  Smoking Status Former Smoker  . Packs/day: 1.50  . Years: 29.00  . Pack years: 43.50  . Types: Cigarettes  . Start date: 07/08/1949  . Quit date: 07/08/1978  . Years since quitting: 41.0  Smokeless Tobacco Never Used   Immunization History  Administered Date(s) Administered  . Fluad Quad(high Dose 65+) 03/24/2019  . Influenza Split 05/01/2011, 04/29/2012  . Influenza Whole 04/14/2007, 04/12/2008, 04/12/2009, 04/25/2010  . Influenza, High Dose Seasonal PF 04/12/2014, 04/25/2015, 03/13/2018  . Influenza,inj,Quad PF,6+ Mos 03/09/2013  . Influenza-Unspecified 03/22/2016, 04/15/2017  . Pneumococcal Conjugate-13 06/09/2013  . Pneumococcal Polysaccharide-23 04/07/1993, 05/08/2006, 08/21/2018  . Td 12/12/2008    Assessment and Plan:  COPD GOLD I D Plan: Hold Daliresp for 1 week Transition to  5 mg of prednisone daily on 07/14/2019 Complete televisit with our office on 07/21/2019 Continue Trelegy Ellipta Okay to use duo nebs every 6-8 hours as needed for shortness of breath or wheezing  Current chronic use of systemic steroids Plan:  Transition to 5mg  on 07/14/19   Medication  management Plan:  Hold Daliresp for 7 day  Transition to 5mg  pred daily    Follow Up Instructions:  Return in about 8 days (around 07/21/2019), or if symptoms worsen or fail to improve, for Follow up with [name] FNP-C.   I discussed the assessment and treatment plan with the patient. The patient was provided an opportunity to ask questions and all were answered. The patient agreed with the plan and demonstrated an understanding of the instructions.   The patient was advised to call back or seek an in-person evaluation if the symptoms worsen or if the condition fails to improve as anticipated.  I provided 28 minutes of non-face-to-face time during this encounter.   Lauraine Rinne, NP

## 2019-07-12 NOTE — Telephone Encounter (Signed)
Glad she is feeling better. Please set up for televisit tomorrow to revevaluate with DR Mannam or APP . Please stay on prednisone 10mg  daily until ov tomorrow to discuss.   Please contact office for sooner follow up if symptoms do not improve or worsen or seek emergency care

## 2019-07-13 ENCOUNTER — Encounter: Payer: Self-pay | Admitting: Pulmonary Disease

## 2019-07-13 ENCOUNTER — Ambulatory Visit (INDEPENDENT_AMBULATORY_CARE_PROVIDER_SITE_OTHER): Payer: Medicare PPO | Admitting: Pulmonary Disease

## 2019-07-13 ENCOUNTER — Other Ambulatory Visit: Payer: Self-pay

## 2019-07-13 DIAGNOSIS — Z79899 Other long term (current) drug therapy: Secondary | ICD-10-CM | POA: Diagnosis not present

## 2019-07-13 DIAGNOSIS — Z87891 Personal history of nicotine dependence: Secondary | ICD-10-CM

## 2019-07-13 DIAGNOSIS — Z7952 Long term (current) use of systemic steroids: Secondary | ICD-10-CM

## 2019-07-13 DIAGNOSIS — J42 Unspecified chronic bronchitis: Secondary | ICD-10-CM | POA: Diagnosis not present

## 2019-07-13 NOTE — Patient Instructions (Addendum)
You were seen today by Lauraine Rinne, NP  for:   1. Chronic bronchitis, unspecified chronic bronchitis type (HCC)  Trelegy Ellipta  >>> 1 puff daily in the morning >>>rinse mouth out after use  >>> This inhaler contains 3 medications that help manage her respiratory status, contact our office if you cannot afford this medication or unable to remain on this medication  Start 5 mg of prednisone daily on 07/14/2019  Hold Daliresp until 07/22/2019  Note your daily symptoms > remember "red flags" for COPD:   >>>Increase in cough >>>increase in sputum production >>>increase in shortness of breath or activity  intolerance.   If you notice these symptoms, please call the office to be seen.   2. Medication management  Hold Daliresp  Transition to 5 mg of prednisone daily  3. TOBACCO USE, QUIT  Continue to not smoke  4. Current chronic use of systemic steroids  Start 5 mg of prednisone daily on 07/14/2019   Follow Up:    Return in about 8 days (around 07/21/2019), or if symptoms worsen or fail to improve, for Follow up with [name] FNP-C.   Please do your part to reduce the spread of COVID-19:      Reduce your risk of any infection  and COVID19 by using the similar precautions used for avoiding the common cold or flu:  [name] Kitchen Wash your hands often with soap and warm water for at least 20 seconds.  If soap and water are not readily available, use an alcohol-based hand sanitizer with at least 60% alcohol.  . If coughing or sneezing, cover your mouth and nose by coughing or sneezing into the elbow areas of your shirt or coat, into a tissue or into your sleeve (not your hands). [name] A MASK when in public  . Avoid shaking hands with others and consider head nods or verbal greetings only. . Avoid touching your eyes, nose, or mouth with unwashed hands.  . Avoid close contact with people who are sick. . Avoid places or events with large numbers of people in one location, like concerts or  sporting events. . If you have some symptoms but not all symptoms, continue to monitor at home and seek medical attention if your symptoms worsen. . If you are having a medical emergency, call 911.   [place] / e-Visit: [email or web address]         [facility]: [facility]: [id number]                   [facility]: [id number]     It is flu season:   >>> Best ways to protect herself from the flu: Receive the yearly flu vaccine, practice good hand hygiene washing with soap and also using hand sanitizer when available, eat a nutritious meals, get adequate rest, hydrate appropriately   Please contact the office if your symptoms worsen or you have concerns that you are not improving.   Thank you for choosing [facility] for your healthcare, and for allowing [place] to partner with you on your healthcare journey. I am thankful to be able to provide care to you today.   [name] FNP-C

## 2019-07-13 NOTE — Assessment & Plan Note (Addendum)
Plan: Hold Daliresp for 1 week Transition to 5 mg of prednisone daily on 07/14/2019 Complete televisit with our office on 07/21/2019 Continue Trelegy Ellipta Okay to use duo nebs every 6-8 hours as needed for shortness of breath or wheezing

## 2019-07-13 NOTE — Assessment & Plan Note (Signed)
Plan:  Hold Daliresp for 7 day  Transition to 5mg  pred daily

## 2019-07-13 NOTE — Assessment & Plan Note (Signed)
Plan:  Transition to 5mg  on 07/14/19

## 2019-07-15 ENCOUNTER — Other Ambulatory Visit: Payer: Self-pay | Admitting: Adult Health

## 2019-07-16 NOTE — Progress Notes (Deleted)
Telehealth Visit  {Choose 1 Note Type (Telehealth Visit or Telephone Visit):[phone]}   Evaluation Performed:  Follow-up visit  This visit type was conducted due to national recommendations for restrictions regarding the COVID-19 Pandemic (e.g. social distancing).  This format is felt to be most appropriate for this patient at this time.  All issues noted in this document were discussed and addressed.  No physical exam was performed (except for noted visual exam findings with Video Visits).  Please refer to the patient's chart (MyChart message for video visits and phone note for telephone visits) for the patient's consent to telehealth for [facility]  Date:  07/16/2019   ID:  [name], DOB [birth year], MRN [id number]  Patient Location:  ***  Provider location:   ***  PCP:  [name], NP  Cardiologist:  Servando Snare & No primary care provider on file.  Electrophysiologist:  None   Chief Complaint:  ***  History of Present Illness:    [name] is a 86 y.o. female who presents via audio/video conferencing for a telehealth visit today.  Seen for Dr. Johnsie Cancel.Has primarily followed with me over the past several years.  She has ahistory of labile HTN, HLD, hypothyroidism, CVA (left parietal in Dec. 2015), carotid artery diease w/high-grade left ICA stenosis with plaque ulceration, and pulmonary nodule.She had vocal cord paralysis with "possible TE from extensive aortic arch atherosclerosis" in 2017 - she was reluctant to be on anticoagulation and opted for aspirin and Plavix - then had profound anemia from a bleeding AVM and has elected to remain just on aspirin only despite being at risk for embolism from the aortic arch atherosclerosis. She has had marked intolerance to Labetalol in the past, has not tolerated Clonidine and her anxiety has felt to play a significant role in her BP management. Low risk Myoview from 2017. She has daily alcohol use.    I have followed her  over the past few years - she has done ok - several COPD exacerbations and now on chronic Prednisone. Daily alcohol continues. She has no intention of checking BP at home. Has had more issues with swelling and her high dose Norvasc was felt to be a contributing factor. Last visit in September - the pandemic was taking a significant mental toll on her. Admitted she would forget some doses of HCTZ and thought that on the days she did take it - BP too low for her. More vision issues which was impacting her ability to read.   The patient {does/does not:200015} have symptoms concerning for COVID-19 infection (fever, chills, cough, or new shortness of breath).   Seen today via ***. *** has consented for this visit.    Past Medical History:  Diagnosis Date  . Anxiety   . Aortic arch atherosclerosis ([place]) 06/24/2014  . Atherosclerotic ulcer of aorta ([place]) 06/24/2014  . Carotid artery occlusion   . [place] DISEASE, LUMBAR 12/16/2008  . DIVERTICULOSIS, COLON 09/30/2008  . DYSPNEA 07/13/2008  . Graves disease   . History of embolic stroke [id number]   Left brain  . HYPERLIPIDEMIA 03/06/2007  . HYPERTENSION 03/06/2007  . HYPOTHYROIDISM 10/13/2007  . OSTEOARTHRITIS 03/06/2007  . Personal history of colonic polyps 09/30/2008  . Stroke ([place])    06/2014           . TOBACCO USE, QUIT 04/12/2009  . WEAKNESS 11/09/2007   Past Surgical History:  Procedure Laterality Date  . CARDIAC CATHETERIZATION    . CATARACT EXTRACTION Bilateral   . CHOLECYSTECTOMY    .  ENDARTERECTOMY Left 11/13/2015   Procedure: LEFT CAROTID ARTERY ENDARTERECTOMY;  Surgeon: Conrad Parks, MD;  Location: [place];  Service: Vascular;  Laterality: Left;  . ESOPHAGOGASTRODUODENOSCOPY (EGD) WITH PROPOFOL N/A 10/11/2016   Procedure: ESOPHAGOGASTRODUODENOSCOPY (EGD) WITH PROPOFOL;  Surgeon: Mauri Pole, MD;  Location: [facility];  Service: Endoscopy;  Laterality: N/A;  . KNEE SURGERY    . PATCH ANGIOPLASTY Left 11/13/2015   Procedure: WITH 1CM X  6CM  XENOSURE BIOLOGIC PATCH ANGIOPLASTY;  Surgeon: Conrad [place], MD;  Location: [facility];  Service: Vascular;  Laterality: Left;  . TEE WITHOUT CARDIOVERSION N/A 06/24/2014   Procedure: TRANSESOPHAGEAL ECHOCARDIOGRAM (TEE);  Surgeon: Sanda Klein, MD;  Location: [facility] END[facility]COPY;  Service: Cardiovascular;  Laterality: N/A;     No outpatient medications have been marked as taking for the 07/19/19 encounter (Appointment) with Burtis Junes, NP.     Allergies:   Catapres [clonidine hcl], Lisinopril, and Labetalol hcl   Social History   Tobacco Use  . Smoking status: Former Smoker    Packs/day: 1.50    Years: 29.00    Pack years: 43.50    Types: Cigarettes    Start date: 07/08/1949    Quit date: 07/08/1978    Years since quitting: 41.0  . Smokeless tobacco: Never Used  Substance Use Topics  . Alcohol use: Yes    Alcohol/week: 1.0 standard drinks    Types: 1 Glasses of wine per week    Comment: "red wine"- some nights  . Drug use: No     Family Hx: The patient's family history includes Stomach cancer in her maternal grandmother. There is no history of Colon cancer.  ROS:   Please see the history of present illness.   All other systems reviewed are negative except for ***.    Objective:    Vital Signs:  There were no vitals taken for this visit.   Wt Readings from Last 3 Encounters:  06/21/19 143 lb 3.2 oz (65 kg)  04/12/19 138 lb (62.6 kg)  04/07/19 141 lb (64 kg)    Alert female in no acute distress.   Labs/Other Tests and Data Reviewed:    Lab Results  Component Value Date   WBC 8.0 01/26/2019   HGB 13.5 01/26/2019   HCT 40.2 01/26/2019   PLT 196.0 01/26/2019   GLUCOSE 183 (H) 03/24/2019   CHOL 218 (H) 03/24/2019   TRIG 73 03/24/2019   HDL 111 03/24/2019   LDLDIRECT 141.7 05/01/2011   LDLCALC 94 03/24/2019   ALT 21 03/24/2019   AST 19 03/24/2019   NA 141 03/24/2019   K 4.1 03/24/2019   CL 103 03/24/2019   CREATININE 1.03 (H) 03/24/2019   BUN 22 03/24/2019    CO2 22 03/24/2019   TSH 1.280 03/24/2019   INR 0.93 08/17/2018   HGBA1C 6.0 01/26/2019     BNP (last 3 results) No results for input(s): BNP in the last 8760 hours.  ProBNP (last 3 results) No results for input(s): PROBNP in the last 8760 hours.    Prior CV studies:    The following studies were reviewed today:  EchoStudy Conclusions7/2019  - Left ventricle: The cavity size was normal. There was moderate concentric hypertrophy. Systolic function was normal. The estimated ejection fraction was in the range of 55% to 60%. Wall motion was normal; there were no regional wall motion abnormalities. - Aortic valve: Valve mobility was mildly restricted. There was mild stenosis. There was trivial regurgitation. Valve area (VTI): 1.53 cm^2.  Valve area (Vmean): 1.42 cm^2. - Atrial septum: There was increased thickness of the septum, consistent with lipomatous hypertrophy.  Impressions:  - Normal LV ejection fraction, mild aortic stenosis.   Nm Myocar Multi W/spect W/wall Motion / Ef 10/05/2015   There was no ST segment deviation noted during stress.  Defect 1: There is a medium defect of mild severity present in the mid anteroseptal, mid inferoseptal and apical septal location.  This is a low risk study.  The study is normal.  The left ventricular ejection fraction is normal (55-65%).  Nuclear stress EF: 57%. Low risk stress nuclear study with LBBB-related perfusion artifact, otherwise normal perfusion and normal left ventricular regional and global systolic function.   CTA Neck (10/09/15) Advanced atherosclerotic plaque of the thoracic aorta and distal aortic arch.  50% diameter stenosis proximal right internal carotid artery  Irregular plaque of the distal left common carotid artery with plaque ulceration. Very irregular plaque involving the proximal left internal carotid artery. Critical 85% stenosis of the proximal left internal carotid  artery  Mild atherosclerotic disease in the right vertebral artery without significant vertebral artery stenosis.  Heterogeneous thyroid bilaterally with 11 mm right lower pole nodule. Recommend thyroid ultrasound.   ASSESSMENT & PLAN:    1.Labile HTN: BP recheck by me is down to 140/80. She admits she is missing doses of HCTZ but feels on the days she takes this, that her BP is too low. She is agreeable to taking every other day. BMET today. Her swelling has improved with the reduction of the Norvasc.  2.Carotid artery diease w/high-grade left ICA stenosis with plaque ulceration:s/p CEA 0000000 whichwas complicated by a left vocal cord paralysis. Noted severe atherosclerotic burden in the arch with concerns for possible embolism.She is followed by [facility].Not discussed today.   3.HLD:on high dose statin therapy- no recent lipids - we will check today.   4. COPD/Pulmonary nodule(noted to be likely benign):now on chronic steroid therapy and this has helped.She was to see pulmonary back in May - most likely postponed due to COVID - daughter will call to arrange.   5.Hypothyroidism:checking TSH today - she has chronic cold intolerance apparently.   6.Previous CVA: Continue aspirin and statin.  7.Anxiety:always an issue -this seems better today.Not discussed today.  8. Swelling-see #1.Improved.   9. Advanced age- slowing down - probably as a result of COVID.  10. Daily alcohol use- I do not see this changing.Admits to just one glass of wine a night.We did not discuss this today.   [name] Kitchen COVID-19 Education: The signs and symptoms of COVID-19 were discussed with the patient and how to seek care for testing (follow up with PCP or arrange E-visit).  The importance of social distancing, staying at home, hand hygiene and wearing a mask when out in public were discussed today.  Patient Risk:   After full review of this patient's clinical status, I feel that  they are at least moderate risk at this time.  Time:   Today, I have spent *** minutes with the patient with telehealth technology discussing the above issues.     Medication Adjustments/Labs and Tests Ordered: Current medicines are reviewed at length with the patient today.  Concerns regarding medicines are outlined above.   Tests Ordered: No orders of the defined types were placed in this encounter.   Medication Changes: No orders of the defined types were placed in this encounter.   Disposition:  FU with *** in {gen number [id number] {Days to years:10300}.  Patient is agreeable to this plan and will call if any problems develop in the interim.   Amie Critchley, NP  07/16/2019 1:12 PM     [facility]

## 2019-07-19 ENCOUNTER — Ambulatory Visit: Payer: Medicare Other | Admitting: Nurse Practitioner

## 2019-07-19 ENCOUNTER — Telehealth: Payer: Medicare PPO | Admitting: Nurse Practitioner

## 2019-07-20 ENCOUNTER — Telehealth: Payer: Medicare PPO | Admitting: Cardiology

## 2019-07-20 ENCOUNTER — Telehealth: Payer: Self-pay | Admitting: Pulmonary Disease

## 2019-07-20 NOTE — Telephone Encounter (Signed)
Noted.  Thank you for letting me know.  [name] FNP

## 2019-07-20 NOTE — Telephone Encounter (Signed)
[name],  Sent this to you as an FYI only since the patient has a televisit with you tomorrow at 9:30  Called her daughter back and advised her that we do not have conference call set up. Confirmed she does have access to MyChart for patient (mother's) chart. Confirmed that the televisit would be documented the same as if the patient was seen in the clinic.  She wanted to let us know the patient does have difficulty with her hearing and may pause at times. And sometimes when called may delay in response when she initially picks up the phone. She said that if provider cannot reach the patient tomorrow, to please call her number [phone]) and she will go to her mom's home and bang on the door to let her know our office was trying to reach her. She lives across street/next door to her mom.

## 2019-07-20 NOTE — Progress Notes (Signed)
Virtual Visit via Telephone Note  I connected with [name] on 07/21/19 at  9:30 AM EST by telephone and verified that I am speaking with the correct person using two identifiers.  Location: Patient: Home Provider: [facility] - [id number], [place], [place], [place] [address]   I discussed the limitations, risks, security and privacy concerns of performing an evaluation and management service by telephone and the availability of in person appointments. I also discussed with the patient that there may be a patient responsible charge related to this service. The patient expressed understanding and agreed to proceed.  Patient consented to consult via telephone: Yes People present and their role in pt care: Pt    History of Present Illness:  86 year old female former smoker followed in our office for COPD Gold I D (01/2018 - PFT Fev1 - 84), emphysema (01/2018 dlco - 57)  PMH: Hypertension, chronic kidney disease stage III, hypothyroidism, osteoarthritis, stroke, left bundle branch block, hyperlipidemia Smoker/ Smoking History: Former smoker.  43.5-pack-year history. Maintenance:  Trelegy Ellipta Pt of: Dr. Vaughan Browner  Chief complaint: 1 week follow up, stopped daliresp  86 year old female former smoker completing 1 week televisit to follow-up with symptoms suspected to be related to [facility] prescription.  Patient stopped Daliresp at last office visit.  Patient reports that her symptoms have improved significantly.  She feels much better on 5 mg of prednisone and being off of Daliresp.  Patient has no acute complaints at this point in time.  She did have some congestion last week but that has since resolved.  Her daughter does currently have a pending Covid test.  Observations/Objective:  CT scan chest 11/26/12- Right lingular opacity, scattered sub cm pulmonary nodules CT 04/05/16- Stable pulmonary nodules Severe COPD. Chest x-ray 01/12/2018- hyperinflation, no active  pulmonary disease.  Stable 7 mm opacity in the left midlung. Chest x-ray 08/17/2018- hyperinflation, chronic nodular density over left lung.  PFTs 04/02/16 FVC 2.54 (107%), FEV1 1.43 (82%), F/F 56, TLC 107%, DLCO 42% Mod obstructive lung disease with DLCO impairment.  No bronchodilator response   Social History   Tobacco Use  Smoking Status Former Smoker  . Packs/day: 1.50  . Years: 29.00  . Pack years: 43.50  . Types: Cigarettes  . Start date: 07/08/1949  . Quit date: 07/08/1978  . Years since quitting: 41.0  Smokeless Tobacco Never Used   Immunization History  Administered Date(s) Administered  . Fluad Quad(high Dose 65+) 03/24/2019  . Influenza Split 05/01/2011, 04/29/2012  . Influenza Whole 04/14/2007, 04/12/2008, 04/12/2009, 04/25/2010  . Influenza, High Dose Seasonal PF 04/12/2014, 04/25/2015, 03/13/2018  . Influenza,inj,Quad PF,6+ Mos 03/09/2013  . Influenza-Unspecified 03/22/2016, 04/15/2017  . Pneumococcal Conjugate-13 06/09/2013  . Pneumococcal Polysaccharide-23 04/07/1993, 05/08/2006, 08/21/2018  . Td 12/12/2008    Assessment and Plan:  COPD GOLD I D Plan: Remain off Daliresp We will schedule follow-up with Dr. Vaughan Browner in [place] to use duo nebs every 6-8 hours as needed for shortness of breath or wheezing Remain on 5 mg of prednisone at this time  Current chronic use of systemic steroids Plan: Remain on 5 mg of prednisone  We have discussed risk and benefits of remaining on chronic steroids.  The patient understands these.  She would like to remain on chronic steroids at this time she believes that this improves her overall quality life.  Could consider transition in February to 5 mg every other day, with 2.5 mg the days in between  Medication  management Plan: Remain off of Daliresp  Advice given about COVID-19 virus infection Plan:  Pt to contact our office if DTR is positive    Follow Up Instructions:  Return in  about 6 weeks (around 09/01/2019), or if symptoms worsen or fail to improve, for Follow up with Dr. Vaughan Browner.   I discussed the assessment and treatment plan with the patient. The patient was provided an opportunity to ask questions and all were answered. The patient agreed with the plan and demonstrated an understanding of the instructions.   The patient was advised to call back or seek an in-person evaluation if the symptoms worsen or if the condition fails to improve as anticipated.  I provided 24 minutes of non-face-to-face time during this encounter.   Lauraine Rinne, NP

## 2019-07-21 ENCOUNTER — Encounter: Payer: Self-pay | Admitting: Pulmonary Disease

## 2019-07-21 ENCOUNTER — Other Ambulatory Visit: Payer: Self-pay

## 2019-07-21 ENCOUNTER — Ambulatory Visit (INDEPENDENT_AMBULATORY_CARE_PROVIDER_SITE_OTHER): Payer: Medicare PPO | Admitting: Pulmonary Disease

## 2019-07-21 DIAGNOSIS — Z7952 Long term (current) use of systemic steroids: Secondary | ICD-10-CM

## 2019-07-21 DIAGNOSIS — J42 Unspecified chronic bronchitis: Secondary | ICD-10-CM | POA: Diagnosis not present

## 2019-07-21 DIAGNOSIS — Z79899 Other long term (current) drug therapy: Secondary | ICD-10-CM | POA: Diagnosis not present

## 2019-07-21 DIAGNOSIS — Z7189 Other specified counseling: Secondary | ICD-10-CM | POA: Insufficient documentation

## 2019-07-21 NOTE — Assessment & Plan Note (Signed)
Plan:  Pt to contact our office if DTR is positive

## 2019-07-21 NOTE — Assessment & Plan Note (Signed)
Plan: Remain off Daliresp We will schedule follow-up with Dr. Vaughan Browner in [place] to use duo nebs every 6-8 hours as needed for shortness of breath or wheezing Remain on 5 mg of prednisone at this time

## 2019-07-21 NOTE — Patient Instructions (Addendum)
You were seen today by Lauraine Rinne, NP  for:   1. Chronic bronchitis, unspecified chronic bronchitis type (HCC)  Trelegy Ellipta  >>> 1 puff daily in the morning >>>rinse mouth out after use  >>> This inhaler contains 3 medications that help manage her respiratory status, contact our office if you cannot afford this medication or unable to remain on this medication  Continue 5mg  prednisone   Ok to remain off daliresp   Note your daily symptoms > remember "red flags" for COPD:   >>>Increase in cough >>>increase in sputum production >>>increase in shortness of breath or activity  intolerance.   If you notice these symptoms, please call the office to be seen.    2. Medication management  Ok to remain off daliresp   3. Current chronic use of systemic steroids  Ok to remain on 5mg  prednisone daily   4. Advice given about COVID-19 virus infection  If your daughter is positive please contact our office    Follow Up:    Return in about 6 weeks (around 09/01/2019), or if symptoms worsen or fail to improve, for Follow up with Dr. Vaughan Browner.   Please do your part to reduce the spread of COVID-19:      Reduce your risk of any infection  and COVID19 by using the similar precautions used for avoiding the common cold or flu:  [name] Kitchen Wash your hands often with soap and warm water for at least 20 seconds.  If soap and water are not readily available, use an alcohol-based hand sanitizer with at least 60% alcohol.  . If coughing or sneezing, cover your mouth and nose by coughing or sneezing into the elbow areas of your shirt or coat, into a tissue or into your sleeve (not your hands). [name] A MASK when in public  . Avoid shaking hands with others and consider head nods or verbal greetings only. . Avoid touching your eyes, nose, or mouth with unwashed hands.  . Avoid close contact with people who are sick. . Avoid places or events with large numbers of people in one location, like concerts  or sporting events. . If you have some symptoms but not all symptoms, continue to monitor at home and seek medical attention if your symptoms worsen. . If you are having a medical emergency, call 911.   [place] / e-Visit: [email or web address]         [facility]: [name]: [id number]                   [facility]: [id number]     It is flu season:   >>> Best ways to protect herself from the flu: Receive the yearly flu vaccine, practice good hand hygiene washing with soap and also using hand sanitizer when available, eat a nutritious meals, get adequate rest, hydrate appropriately    Please contact the office if your symptoms worsen or you have concerns that you are not improving.   Thank you for choosing [facility] for your healthcare, and for allowing [place] to partner with you on your healthcare journey. I am thankful to be able to provide care to you today.   [name] FNP-C

## 2019-07-21 NOTE — Assessment & Plan Note (Signed)
Plan: Remain on 5 mg of prednisone  We have discussed risk and benefits of remaining on chronic steroids.  The patient understands these.  She would like to remain on chronic steroids at this time she believes that this improves her overall quality life.  Could consider transition in February to 5 mg every other day, with 2.5 mg the days in between

## 2019-07-21 NOTE — Assessment & Plan Note (Signed)
Plan: Remain off of Daliresp

## 2019-07-26 ENCOUNTER — Telehealth: Payer: Self-pay | Admitting: Adult Health

## 2019-07-26 NOTE — Telephone Encounter (Signed)
Copied from [place]. Topic: General - Other >> Jul 26, 2019 11:15 AM Keene Breath wrote: Reason for CRM: Patient's daughter called to schedule appt. For today for hip pain, [place] [employer] [place] CB# [phone])  Would like to be seen today.  Tried the office, but no answer.

## 2019-07-26 NOTE — Telephone Encounter (Signed)
Patient scheduled for 07/27/19 with Tommi Rumps.

## 2019-07-27 ENCOUNTER — Encounter: Payer: Self-pay | Admitting: Adult Health

## 2019-07-27 ENCOUNTER — Other Ambulatory Visit: Payer: Self-pay

## 2019-07-27 ENCOUNTER — Telehealth: Payer: Self-pay | Admitting: Adult Health

## 2019-07-27 ENCOUNTER — Ambulatory Visit (INDEPENDENT_AMBULATORY_CARE_PROVIDER_SITE_OTHER): Payer: Medicare PPO | Admitting: Adult Health

## 2019-07-27 ENCOUNTER — Ambulatory Visit (INDEPENDENT_AMBULATORY_CARE_PROVIDER_SITE_OTHER): Payer: Medicare PPO

## 2019-07-27 VITALS — BP 140/60 | HR 56 | Temp 97.8°F | Ht 66.0 in | Wt 140.0 lb

## 2019-07-27 DIAGNOSIS — M5432 Sciatica, left side: Secondary | ICD-10-CM

## 2019-07-27 DIAGNOSIS — M25552 Pain in left hip: Secondary | ICD-10-CM

## 2019-07-27 MED ORDER — METHYLPREDNISOLONE 4 MG PO TBPK: ORAL_TABLET | ORAL | 0 refills | Status: DC

## 2019-07-27 NOTE — Progress Notes (Signed)
Subjective:    Patient ID: [name], female    DOB: 02-28-1925, 86 y.o.   MRN: [id number]  HPI  86 year old female who  has a past medical history of Anxiety, Aortic arch atherosclerosis ([place]) (06/24/2014), Atherosclerotic ulcer of aorta ([place]) (06/24/2014), Carotid artery occlusion, DISC DISEASE, LUMBAR (12/16/2008), DIVERTICULOSIS, COLON (09/30/2008), DYSPNEA (07/13/2008), Graves disease, History of embolic stroke (XX123456), HYPERLIPIDEMIA (03/06/2007), HYPERTENSION (03/06/2007), HYPOTHYROIDISM (10/13/2007), OSTEOARTHRITIS (03/06/2007), Personal history of colonic polyps (09/30/2008), Stroke ([place]), TOBACCO USE, QUIT (04/12/2009), and WEAKNESS (11/09/2007).   She presents with her daughter to this visit.  He is being evaluated today for an acute complaint of left buttock pain.  She reports the pain started approximately 1 week ago and was first noticed when she was getting out of bed.  As the week progressed the pain has been getting progressively worse.  The only relief she gets is when she sits.  Has pain with weightbearing, and change of positions.  At times pain radiates from underneath the left gluteal down the outside of her left leg.  She denies falls, trauma, or aggravating injury.  She has tried using over-the-counter pain medication including Tylenol and Motrin as well as heating pads she did not notice much improvement in her symptoms with these modalities.  Over the last 2 days she has been doing some gentle stretching exercises and again has not noticed any improvement  She denies low back pain    Review of Systems See HPI   Past Medical History:  Diagnosis Date  . Anxiety   . Aortic arch atherosclerosis ([place]) 06/24/2014  . Atherosclerotic ulcer of aorta ([place]) 06/24/2014  . Carotid artery occlusion   . [place] DISEASE, LUMBAR 12/16/2008  . DIVERTICULOSIS, COLON 09/30/2008  . DYSPNEA 07/13/2008  . Graves disease   . History of embolic stroke [id number]   Left brain  . HYPERLIPIDEMIA  03/06/2007  . HYPERTENSION 03/06/2007  . HYPOTHYROIDISM 10/13/2007  . OSTEOARTHRITIS 03/06/2007  . Personal history of colonic polyps 09/30/2008  . Stroke ([place])    06/2014           . TOBACCO USE, QUIT 04/12/2009  . WEAKNESS 11/09/2007    Social History   Socioeconomic History  . Marital status: Divorced    Spouse name: Not on file  . Number of children: 4  . Years of education: college  . Highest education level: Not on file  Occupational History  . Occupation: retired  Tobacco Use  . Smoking status: Former Smoker    Packs/day: 1.50    Years: 29.00    Pack years: 43.50    Types: Cigarettes    Start date: 07/08/1949    Quit date: 07/08/1978    Years since quitting: 41.0  . Smokeless tobacco: Never Used  Substance and Sexual Activity  . Alcohol use: Yes    Alcohol/week: 1.0 standard drinks    Types: 1 Glasses of wine per week    Comment: "red wine"- some nights  . Drug use: No  . Sexual activity: Not on file  Other Topics Concern  . Not on file  Social History Narrative      Patient is right handed.   Patient drinks 1 cup caffeine daily.   Social Determinants of Health   Financial Resource Strain:   . Difficulty of Paying Living Expenses: Not on file  Food Insecurity:   . Worried About Charity fundraiser in the Last Year: Not on file  . Ran Out of Food  in the Last Year: Not on file  Transportation Needs:   . Lack of Transportation (Medical): Not on file  . Lack of Transportation (Non-Medical): Not on file  Physical Activity:   . Days of Exercise per Week: Not on file  . Minutes of Exercise per Session: Not on file  Stress:   . Feeling of Stress : Not on file  Social Connections:   . Frequency of Communication with Friends and Family: Not on file  . Frequency of Social Gatherings with Friends and Family: Not on file  . Attends Religious Services: Not on file  . Active Member of Clubs or Organizations: Not on file  . Attends Archivist Meetings: Not on file   . Marital Status: Not on file  Intimate Partner Violence:   . Fear of Current or Ex-Partner: Not on file  . Emotionally Abused: Not on file  . Physically Abused: Not on file  . Sexually Abused: Not on file    Past Surgical History:  Procedure Laterality Date  . CARDIAC CATHETERIZATION    . CATARACT EXTRACTION Bilateral   . CHOLECYSTECTOMY    . ENDARTERECTOMY Left 11/13/2015   Procedure: LEFT CAROTID ARTERY ENDARTERECTOMY;  Surgeon: Conrad [place], MD;  Location: [facility];  Service: Vascular;  Laterality: Left;  . ESOPHAGOGASTRODUODENOSCOPY (EGD) WITH PROPOFOL N/A 10/11/2016   Procedure: ESOPHAGOGASTRODUODENOSCOPY (EGD) WITH PROPOFOL;  Surgeon: Mauri Pole, MD;  [facility]: [facility];  Service: Endoscopy;  Laterality: N/A;  . KNEE SURGERY    . PATCH ANGIOPLASTY Left 11/13/2015   Procedure: WITH 1CM X 6CM  XENOSURE BIOLOGIC PATCH ANGIOPLASTY;  Surgeon: Conrad [place], MD;  Location: [facility];  Service: Vascular;  Laterality: Left;  . TEE WITHOUT CARDIOVERSION N/A 06/24/2014   Procedure: TRANSESOPHAGEAL ECHOCARDIOGRAM (TEE);  Surgeon: Sanda Klein, MD;  [facility];  Service: Cardiovascular;  Laterality: N/A;    Family History  Problem Relation Age of Onset  . Stomach cancer Maternal Grandmother   . Colon cancer Neg Hx     Allergies  Allergen Reactions  . Catapres [Clonidine Hcl] Other (See Comments)    Made pt feel horrible, shaky, weak and nausea  . Lisinopril Anaphylaxis    Tongue swelling  . Labetalol Hcl Other (See Comments)    Caused bradycardia and syncope    Current Outpatient Medications on File Prior to Visit  Medication Sig Dispense Refill  . albuterol (PROAIR HFA) 108 (90 Base) MCG/ACT inhaler Inhale 2 puffs into the lungs every 6 (six) hours as needed for wheezing or shortness of breath. 8.5 Inhaler 5  . amLODipine (NORVASC) 10 MG tablet Take 0.5 tablets (5 mg total) by mouth daily. 90 tablet 3  . aspirin EC 81 MG tablet Take one tablet by mouth 5 days per  week 90 tablet 3  . atorvastatin (LIPITOR) 80 MG tablet TAKE 1 TABLET BY MOUTH EVERY DAY AT 6PM 90 tablet 1  . Fluticasone-Umeclidin-Vilant (TRELEGY ELLIPTA) 100-62.5-25 MCG/INH AEPB Inhale 1 puff into the lungs daily. 60 each 5  . Glycerin-Hypromellose-PEG 400 ([facility] DRY EYE RELIEF) 0.2-0.2-1 % SOLN Place 1 drop into both eyes as needed (dry eyes).     . hydrALAZINE (APRESOLINE) 25 MG tablet Take 75 mg by mouth 3 (three) times daily.    . hydrochlorothiazide (HYDRODIURIL) 25 MG tablet Take 1 tablet (25 mg total) by mouth every other day. 45 tablet 1  . ipratropium-albuterol (DUONEB) 0.5-2.5 (3) MG/3ML SOLN Take 3 mLs by nebulization every 6 (six) hours as needed.  360 mL 3  . latanoprost (XALATAN) 0.005 % ophthalmic solution Place 1 drop into both eyes at bedtime.     [name] Kitchen levothyroxine (SYNTHROID) 75 MCG tablet TAKE 1 TABLET BY MOUTH EVERY DAY BEFORE BREAKFAST 90 tablet 3  . Lidocaine 4 % PTCH Apply 1 patch topically 2 (two) times daily. (Patient taking differently: Apply 1 patch topically as needed. Right hip pain) 12 patch 0  . predniSONE (DELTASONE) 10 MG tablet Take 0.5 tablets (5 mg total) by mouth daily with breakfast. 30 tablet 3  . roflumilast (DALIRESP) 500 MCG TABS tablet Take 1 tablet (500 mcg total) by mouth daily. 30 tablet 11  . Spacer/Aero-Holding Chambers (AEROCHAMBER MV) inhaler Use as instructed 1 each 0   No current facility-administered medications on file prior to visit.    BP 140/60   Pulse (!) 56   Temp 97.8 F (36.6 C) (Other (Comment))   Ht 5\' 6"  (1.676 m)   Wt 140 lb (63.5 kg)   SpO2 94%   BMI 22.60 kg/m       Objective:   Physical Exam Vitals and nursing note reviewed.  Constitutional:      Appearance: Normal appearance.  Musculoskeletal:        General: Tenderness present. Normal range of motion.     Comments: Pain with palpation to left gluteal muscle.  When palpating this caused a feeling of "tingling" to go down the lateral aspect of her left leg.   Skin:    General: Skin is warm and dry.  Neurological:     General: No focal deficit present.     Mental Status: She is alert and oriented to person, place, and time.  Psychiatric:        Mood and Affect: Mood normal.        Behavior: Behavior normal.        Thought Content: Thought content normal.        Judgment: Judgment normal.       Assessment & Plan:  1. Left hip pain - Likely sciatica but will get xray of left hip to r/o skeletal issue.  She was already on prednisone for COPD and is managed by pulmonary.  Can consider increasing to a Medrol Dosepak for 6 days.  Try and stay away from muscle relaxers due to age and gait instability at this time.  Contiue with heating pad and gentle stretching exercises - DG Hip Unilat W OR W/O Pelvis 2-3 Views Left; Future - DG Hip Unilat W OR W/O Pelvis 2-3 Views Left  [name], NP

## 2019-07-27 NOTE — Telephone Encounter (Signed)
Spoke to patient and her daughter Jovita Gamma.  X-ray of the left hip was negative except for degenerative changes.  Her symptoms are more consistent with sciatic pain.  Will send in Medrol Dosepak to see if a larger dose of prednisone helps alleviate her pain.  She was advised to not take her normal prednisone dose on top of the Medrol Dosepak.  She can also take Motrin 600 mg 3 times daily as needed for the next 3 to 4 days.  Continue with heat pack and gentle stretching exercises.  Consider physical therapy or MRI in the future if her symptoms do not resolve

## 2019-07-27 NOTE — Telephone Encounter (Signed)
Noted  

## 2019-07-29 NOTE — Telephone Encounter (Signed)
This has been taking care of.

## 2019-08-04 NOTE — Progress Notes (Signed)
CARDIOLOGY OFFICE NOTE  Date:  08/09/2019    [name] Date of Birth: 02/08/1925 Medical Record [id number]  PCP:  [name], NP  Cardiologist:  Gillian Shields   Chief Complaint  Patient presents with  . Follow-up    History of Present Illness: [name] is a 86 y.o. female who presents today for a follow up visit. Seen for Dr. Johnsie Cancel.Has primarily followed with me over the past several years.  She has ahistory of labile HTN (intolerant to labetalol and has not tolerated Clonidine), HLD, hypothyroidism, CVA (left parietal in Dec. 2015), carotid artery diease w/high-grade left ICA stenosis with plaque ulceration, COPD and pulmonary nodule.Low risk Myoview in 2017. Anxiety has played significant role in her BP management.    She was to have L CEA on May of 2017- looks like this complicated by a vocal cord paralysis and "with possible TE from extensive aortic arch atherosclerosis".She wasreluctant to start anticoagulation. She elected to take ASA and Plavix instead.But then was admitted withprofound anemia in the setting of aspirin and Plavix - transfused - had EGD and noted large AVM that was clipped. She is to stay just on aspirin despite being at risk for embolism from the aortic arch atherosclerosis.  I have followed her since. She does not check her BP at home anymore - makes her too anxious. Has had several COPD exacerbations - now on chronic steroids. Daily alcohol use.   Last seen in September - the staying at home was really taking a toll on her. We had cut her Norvasc back due to swelling in her legs. She admitted to forgetting some doses of HCTZ. Vision deteriorating.   The patient does not have symptoms concerning for COVID-19 infection (fever, chills, cough, or new shortness of breath).   Comes in today. Here with her daughter. She is doing ok for the most part - her breathing is "good and bad" - worse with the weather changes. She has been  tapered back down to 5 mg of Prednisone. She is not taking any more HCTZ - stopped this "a long time ago". No swelling. BP is ok here. No chest pain. Not dizzy. No syncope. Only takes her aspirin during the week.   Past Medical History:  Diagnosis Date  . Anxiety   . Aortic arch atherosclerosis ([place]) 06/24/2014  . Atherosclerotic ulcer of aorta ([place]) 06/24/2014  . Carotid artery occlusion   . [place] DISEASE, LUMBAR 12/16/2008  . DIVERTICULOSIS, COLON 09/30/2008  . DYSPNEA 07/13/2008  . Graves disease   . History of embolic stroke [id number]   Left brain  . HYPERLIPIDEMIA 03/06/2007  . HYPERTENSION 03/06/2007  . HYPOTHYROIDISM 10/13/2007  . OSTEOARTHRITIS 03/06/2007  . Personal history of colonic polyps 09/30/2008  . Stroke ([place])    06/2014           . TOBACCO USE, QUIT 04/12/2009  . WEAKNESS 11/09/2007    Past Surgical History:  Procedure Laterality Date  . CARDIAC CATHETERIZATION    . CATARACT EXTRACTION Bilateral   . CHOLECYSTECTOMY    . ENDARTERECTOMY Left 11/13/2015   Procedure: LEFT CAROTID ARTERY ENDARTERECTOMY;  Surgeon: Conrad [place], MD;  Location: [facility];  Service: Vascular;  Laterality: Left;  . ESOPHAGOGASTRODUODENOSCOPY (EGD) WITH PROPOFOL N/A 10/11/2016   Procedure: ESOPHAGOGASTRODUODENOSCOPY (EGD) WITH PROPOFOL;  Surgeon: Mauri Pole, MD;  [facility] END[facility];  Service: Endoscopy;  Laterality: N/A;  . KNEE SURGERY    . PATCH ANGIOPLASTY Left 11/13/2015  Procedure: WITH 1CM X 6CM  XENOSURE BIOLOGIC PATCH ANGIOPLASTY;  Surgeon: Conrad Tappan, MD;  Location: [facility];  Service: Vascular;  Laterality: Left;  . TEE WITHOUT CARDIOVERSION N/A 06/24/2014   Procedure: TRANSESOPHAGEAL ECHOCARDIOGRAM (TEE);  Surgeon: Sanda Klein, MD;  [facility] ENDOSCOPY;  Service: Cardiovascular;  Laterality: N/A;     Medications: Current Meds  Medication Sig  . albuterol (PROAIR HFA) 108 (90 Base) MCG/ACT inhaler Inhale 2 puffs into the lungs every 6 (six) hours as needed for wheezing or  shortness of breath.  [name] Kitchen amLODipine (NORVASC) 10 MG tablet Take 0.5 tablets (5 mg total) by mouth daily.  [name] Kitchen aspirin EC 81 MG tablet Take one tablet by mouth 5 days per week  . atorvastatin (LIPITOR) 80 MG tablet TAKE 1 TABLET BY MOUTH EVERY DAY AT 6PM  . Fluticasone-Umeclidin-Vilant (TRELEGY ELLIPTA) 100-62.5-25 MCG/INH AEPB Inhale 1 puff into the lungs daily.  . Glycerin-Hypromellose-PEG 400 ([facility] DRY EYE RELIEF) 0.2-0.2-1 % SOLN Place 1 drop into both eyes as needed (dry eyes).   . hydrALAZINE (APRESOLINE) 25 MG tablet Take 75 mg by mouth 3 (three) times daily.  . hydrochlorothiazide (HYDRODIURIL) 25 MG tablet Take 1 tablet (25 mg total) by mouth every other day.  . ipratropium-albuterol (DUONEB) 0.5-2.5 (3) MG/3ML SOLN Take 3 mLs by nebulization every 6 (six) hours as needed.  . latanoprost (XALATAN) 0.005 % ophthalmic solution Place 1 drop into both eyes at bedtime.   [name] Kitchen levothyroxine (SYNTHROID) 75 MCG tablet TAKE 1 TABLET BY MOUTH EVERY DAY BEFORE BREAKFAST  . Lidocaine 4 % PTCH Apply 1 patch topically 2 (two) times daily.  . methylPREDNISolone (MEDROL DOSEPAK) 4 MG TBPK tablet Take as directed  . predniSONE (DELTASONE) 10 MG tablet Take 0.5 tablets (5 mg total) by mouth daily with breakfast.  . Spacer/Aero-Holding Chambers (AEROCHAMBER MV) inhaler Use as instructed  . [DISCONTINUED] roflumilast (DALIRESP) 500 MCG TABS tablet Take 1 tablet (500 mcg total) by mouth daily.     Allergies: Allergies  Allergen Reactions  . Catapres [Clonidine Hcl] Other (See Comments)    Made pt feel horrible, shaky, weak and nausea  . Lisinopril Anaphylaxis    Tongue swelling  . Labetalol Hcl Other (See Comments)    Caused bradycardia and syncope    Social History: The patient  reports that she quit smoking about 41 years ago. Her smoking use included cigarettes. She started smoking about 70 years ago. She has a 43.50 pack-year smoking history. She has never used smokeless tobacco. She reports current  alcohol use of about 1.0 standard drinks of alcohol per week. She reports that she does not use drugs.   Family History: The patient's family history includes Stomach cancer in her maternal grandmother.   Review of Systems: Please see the history of present illness.   All other systems are reviewed and negative.   Physical Exam: VS:  BP (!) 150/70   Pulse 83   Ht 5\' 6"  (1.676 m)   Wt 141 lb (64 kg)   SpO2 97%   BMI 22.76 kg/m  .  BMI Body mass index is 22.76 kg/m.  Wt Readings from Last 3 Encounters:  08/09/19 141 lb (64 kg)  07/27/19 140 lb (63.5 kg)  06/21/19 143 lb 3.2 oz (65 kg)    General: Pleasant. Elderly. Alert and in no acute distress.   HEENT: Normal.  Neck: Supple, no JVD, carotid bruits, or masses noted.  Cardiac: Regular rate and rhythm. Outflow murmur. No edema.  Respiratory:  Lungs are clear to auscultation bilaterally with normal work of breathing.  GI: Soft and nontender.  MS: No deformity or atrophy. Gait and ROM intact.  Skin: Warm and dry. Color is normal.  Neuro:  Strength and sensation are intact and no gross focal deficits noted.  Psych: Alert, appropriate and with normal affect.   LABORATORY DATA:  EKG:  EKG is not ordered today.   Lab Results  Component Value Date   WBC 8.0 01/26/2019   HGB 13.5 01/26/2019   HCT 40.2 01/26/2019   PLT 196.0 01/26/2019   GLUCOSE 183 (H) 03/24/2019   CHOL 218 (H) 03/24/2019   TRIG 73 03/24/2019   HDL 111 03/24/2019   LDLDIRECT 141.7 05/01/2011   LDLCALC 94 03/24/2019   ALT 21 03/24/2019   AST 19 03/24/2019   NA 141 03/24/2019   K 4.1 03/24/2019   CL 103 03/24/2019   CREATININE 1.03 (H) 03/24/2019   BUN 22 03/24/2019   CO2 22 03/24/2019   TSH 1.280 03/24/2019   INR 0.93 08/17/2018   HGBA1C 6.0 01/26/2019     BNP (last 3 results) No results for input(s): BNP in the last 8760 hours.  ProBNP (last 3 results) No results for input(s): PROBNP in the last 8760 hours.   Other Studies Reviewed  Today:  EchoStudy Conclusions7/2019  - Left ventricle: The cavity size was normal. There was moderate concentric hypertrophy. Systolic function was normal. The estimated ejection fraction was in the range of 55% to 60%. Wall motion was normal; there were no regional wall motion abnormalities. - Aortic valve: Valve mobility was mildly restricted. There was mild stenosis. There was trivial regurgitation. Valve area (VTI): 1.53 cm^2. Valve area (Vmean): 1.42 cm^2. - Atrial septum: There was increased thickness of the septum, consistent with lipomatous hypertrophy.  Impressions:  - Normal LV ejection fraction, mild aortic stenosis.   Nm Myocar Multi W/spect W/wall Motion / Ef 10/05/2015   There was no ST segment deviation noted during stress.  Defect 1: There is a medium defect of mild severity present in the mid anteroseptal, mid inferoseptal and apical septal location.  This is a low risk study.  The study is normal.  The left ventricular ejection fraction is normal (55-65%).  Nuclear stress EF: 57%. Low risk stress nuclear study with LBBB-related perfusion artifact, otherwise normal perfusion and normal left ventricular regional and global systolic function.   CTA Neck (10/09/15) Advanced atherosclerotic plaque of the thoracic aorta and distal aortic arch.  50% diameter stenosis proximal right internal carotid artery  Irregular plaque of the distal left common carotid artery with plaque ulceration. Very irregular plaque involving the proximal left internal carotid artery. Critical 85% stenosis of the proximal left internal carotid artery  Mild atherosclerotic disease in the right vertebral artery without significant vertebral artery stenosis.  Heterogeneous thyroid bilaterally with 11 mm right lower pole nodule. Recommend thyroid ultrasound.    Assessment/Plan:  1. Labile HTN - BP fair here today - no changes made - she has stopped the  HCTZ on her own.    2. Carotid disease - s/p CEA 0000000 - complicated by left vocal paralysis - severe atherosclerotic burden in the arch with concerns for possible embolism.   3. HLD - on statin  4. COPD- on chronic steroid therapy  5. Prior CVA - on aspirin and statin.   6. Anxiety - seems stable.   7. Daily alcohol use - not discussed - this is not going to change  8. Aortic  stenosis - she is not interested in updating her echo. Her dyspnea does seem more related to her COPD.   61. Advanced age - still doing ok but slowing down - trying to focus on more quality than quantity.   10. COVID-19 Education: The signs and symptoms of COVID-19 were discussed with the patient and how to seek care for testing (follow up with PCP or arrange E-visit).  The importance of social distancing, staying at home, hand hygiene and wearing a mask when out in public were discussed today. She has had her first COVID vaccine.   Current medicines are reviewed with the patient today.  The patient does not have concerns regarding medicines other than what has been noted above.  The following changes have been made:  See above.  Labs/ tests ordered today include:   No orders of the defined types were placed in this encounter.    Disposition:   FU with me in 6 months.    Patient is agreeable to this plan and will call if any problems develop in the interim.   SignedTruitt Merle, NP  08/09/2019 3:27 PM  [facility] [address] [place], [place]  [address] Phone: [phone] Fax: [phone]   Daughter had presyncopal spell from not eating since yesterday - she assumed she had hypoglycemia - given soda and life savers with complete resolution.   Burtis Junes, RN, [facility] [address] [place], [place]  [address] [phone]

## 2019-08-05 ENCOUNTER — Ambulatory Visit: Payer: Medicare PPO

## 2019-08-08 ENCOUNTER — Ambulatory Visit: Payer: Medicare PPO

## 2019-08-08 ENCOUNTER — Encounter: Payer: Self-pay | Admitting: Adult Health

## 2019-08-09 ENCOUNTER — Ambulatory Visit: Payer: Medicare PPO | Admitting: Nurse Practitioner

## 2019-08-09 ENCOUNTER — Other Ambulatory Visit: Payer: Self-pay

## 2019-08-09 ENCOUNTER — Encounter: Payer: Self-pay | Admitting: Nurse Practitioner

## 2019-08-09 VITALS — BP 150/70 | HR 83 | Ht 66.0 in | Wt 141.0 lb

## 2019-08-09 DIAGNOSIS — I10 Essential (primary) hypertension: Secondary | ICD-10-CM | POA: Diagnosis not present

## 2019-08-09 DIAGNOSIS — E7800 Pure hypercholesterolemia, unspecified: Secondary | ICD-10-CM | POA: Diagnosis not present

## 2019-08-09 DIAGNOSIS — I872 Venous insufficiency (chronic) (peripheral): Secondary | ICD-10-CM

## 2019-08-09 DIAGNOSIS — Z7189 Other specified counseling: Secondary | ICD-10-CM | POA: Diagnosis not present

## 2019-08-09 DIAGNOSIS — R0989 Other specified symptoms and signs involving the circulatory and respiratory systems: Secondary | ICD-10-CM | POA: Diagnosis not present

## 2019-08-09 NOTE — Patient Instructions (Addendum)

## 2019-08-10 ENCOUNTER — Other Ambulatory Visit: Payer: Self-pay | Admitting: Adult Health

## 2019-08-10 DIAGNOSIS — M25552 Pain in left hip: Secondary | ICD-10-CM

## 2019-08-10 NOTE — Telephone Encounter (Signed)
Please advise 

## 2019-08-13 ENCOUNTER — Ambulatory Visit: Payer: Medicare PPO

## 2019-08-16 ENCOUNTER — Ambulatory Visit: Payer: Medicare PPO

## 2019-08-28 ENCOUNTER — Other Ambulatory Visit: Payer: Self-pay | Admitting: Cardiovascular Disease

## 2019-09-01 ENCOUNTER — Ambulatory Visit: Payer: Medicare PPO | Admitting: Pulmonary Disease

## 2019-09-01 ENCOUNTER — Other Ambulatory Visit: Payer: Self-pay

## 2019-09-01 ENCOUNTER — Ambulatory Visit (INDEPENDENT_AMBULATORY_CARE_PROVIDER_SITE_OTHER): Payer: Medicare PPO

## 2019-09-01 ENCOUNTER — Encounter: Payer: Self-pay | Admitting: Pulmonary Disease

## 2019-09-01 VITALS — BP 130/74 | HR 80 | Temp 97.1°F | Ht 66.0 in | Wt 139.6 lb

## 2019-09-01 DIAGNOSIS — R0602 Shortness of breath: Secondary | ICD-10-CM

## 2019-09-01 DIAGNOSIS — J42 Unspecified chronic bronchitis: Secondary | ICD-10-CM | POA: Diagnosis not present

## 2019-09-01 NOTE — Progress Notes (Signed)
[name]    [id number]    1925-02-25  Primary Care Physician:Nafziger, Tommi Rumps, NP  Referring Physician: [name], NP [place],  [place] [address]  Chief complaint:  Follow up for  COPD Gold D Vocal cord paralysis  HPI: [name] is a 86 year old with complaints of dyspnea on exertion. The problem started in May 2017 after a carotid artery end arterectomy. This procedure was complicated by left vocal cord paralysis, hoarseness.  Also has COPD Gold D, mMRC 3 with multiple exacerbations.  Multiple exacerbations and hospitalizations over the past few years for COPD. She was hospitalized in the emergency room in October 2017 for COPD exacerbation. She was discharged on a prednisone taper and amoxicillin. Records from this hospitalization reviewed below in data section Hospitalized in April 2018 with severe anemia, bleeding AVMs.  Underwent EGD with cauterization, clipping of bleeding lesion. Hospitalized for 2 days in July 2019 for mild COPD exacerbation. Seen in the ED on February 2020 with wheezing, dyspnea improved with 1 dose of Solu-Medrol and nebs  Interim History: In 2019 we attempted to try her on Daliresp to get her off the prednisone but she did not tolerate it with increasing dyspnea She is now off the Daliresp and on chronic prednisone between 5 to 10 mg with good control of her breathing  Outpatient Encounter Medications as of 09/01/2019  Medication Sig  . albuterol (PROAIR HFA) 108 (90 Base) MCG/ACT inhaler Inhale 2 puffs into the lungs every 6 (six) hours as needed for wheezing or shortness of breath.  [name] Kitchen amLODipine (NORVASC) 10 MG tablet Take 0.5 tablets (5 mg total) by mouth daily.  [name] Kitchen aspirin EC 81 MG tablet Take one tablet by mouth 5 days per week  . atorvastatin (LIPITOR) 80 MG tablet TAKE 1 TABLET BY MOUTH EVERY DAY AT 6PM  . Fluticasone-Umeclidin-Vilant (TRELEGY ELLIPTA) 100-62.5-25 MCG/INH AEPB Inhale 1 puff into the lungs  daily.  . Glycerin-Hypromellose-PEG 400 ([facility] DRY EYE RELIEF) 0.2-0.2-1 % SOLN Place 1 drop into both eyes as needed (dry eyes).   . hydrALAZINE (APRESOLINE) 25 MG tablet TAKE 3 TABLETS BY MOUTH 3 TIMES A DAY  . hydrochlorothiazide (HYDRODIURIL) 25 MG tablet Take 1 tablet (25 mg total) by mouth every other day.  . ipratropium-albuterol (DUONEB) 0.5-2.5 (3) MG/3ML SOLN Take 3 mLs by nebulization every 6 (six) hours as needed.  . latanoprost (XALATAN) 0.005 % ophthalmic solution Place 1 drop into both eyes at bedtime.   [name] Kitchen levothyroxine (SYNTHROID) 75 MCG tablet TAKE 1 TABLET BY MOUTH EVERY DAY BEFORE BREAKFAST  . Lidocaine 4 % PTCH Apply 1 patch topically 2 (two) times daily.  . predniSONE (DELTASONE) 10 MG tablet Take 0.5 tablets (5 mg total) by mouth daily with breakfast.  . Spacer/Aero-Holding Chambers (AEROCHAMBER MV) inhaler Use as instructed  . [DISCONTINUED] methylPREDNISolone (MEDROL DOSEPAK) 4 MG TBPK tablet Take as directed   No facility-administered encounter medications on file as of 09/01/2019.   Physical Exam: Blood pressure 130/74, pulse 80, temperature (!) 97.1 F (36.2 C), temperature source Temporal, height 5\' 6"  (1.676 m), weight 139 lb 9.6 oz (63.3 kg), SpO2 97 %. Gen:      No acute distress HEENT:  EOMI, sclera anicteric Neck:     No masses; no thyromegaly Lungs:    Clear to auscultation bilaterally; normal respiratory effort CV:         Regular rate and rhythm; no murmurs Abd:      +  bowel sounds; soft, non-tender; no palpable masses, no distension Ext:    No edema; adequate peripheral perfusion Skin:      Warm and dry; no rash Neuro: alert and oriented x 3 Psych: normal mood and affect  Data Reviewed: Imnaging CT scan chest 11/26/12- Right lingular opacity, scattered sub cm pulmonary nodules, images reviewed.  CT 04/05/16- Stable pulmonary nodules Severe COPD. Chest x-ray 01/12/2018- hyperinflation, no active pulmonary disease.  Stable 7 mm opacity in the left  midlung. Chest x-ray 08/17/2018- hyperinflation, chronic nodular density over left lung. I reviewed the images personally.  PFTs 04/02/16 FVC 2.54 (107%), FEV1 1.43 (82%), F/F 56, TLC 107%, DLCO 42% Mod obstructive lung disease with DLCO impairment.  No bronchodilator response  Assessment: Moderate COPD. Although she has moderate obstruction on PFTs her COPD is likely severe based on lung imaging and DLCO impairment.  Currently on Trelegy inhaler  She has not tolerated coming off prednisone and frequency of exacerbations have decreased with a daily pednisone.  Has side effects of skin bruising with higher doses of prednisone Has not tolerated Daliresp.  Looks like she will need to be on prednisone indefinitely  Lung nodule Stable on repeat CT from 2014 to 2017. Likely benign. Chest x-ray to  Goals of care Initiated discussion about goals of care given frailty, age and recurrent exacerbations She tells me that she wants short-term life support but does not want to be kept on the ventilator for long. I encouraged her to consider DNR status.  She wants to think about it Continue discussions going forward.  Health maintenance Up-to-date with flu and Pneumovax. Has received first dose of Covid vaccine  Plan/Recommendations: - Continue Trelegy, duo nebs as needed - Continue prednisone - Chest X-ray - Use incentive spirometer and flutter valve 3 times/ day.  Marshell Garfinkel MD [facility] 09/01/2019, 10:43 AM  CC: [name], NP

## 2019-09-01 NOTE — Addendum Note (Signed)
Addended by: Hildred Alamin I on: 09/01/2019 10:57 AM   Modules accepted: Orders

## 2019-09-01 NOTE — Patient Instructions (Signed)
Glad you are doing well with your breathing Continue prednisone, inhalers Follow-up in 1 year Please give a call sooner if there is any change in your symptoms

## 2019-09-09 ENCOUNTER — Emergency Department (HOSPITAL_COMMUNITY)
Admission: EM | Admit: 2019-09-09 | Discharge: 2019-09-09 | Disposition: A | Discharge status: Home / Self Care | Payer: Medicare PPO | Attending: Emergency Medicine | Admitting: Emergency Medicine

## 2019-09-09 ENCOUNTER — Emergency Department (HOSPITAL_COMMUNITY): Payer: Medicare PPO

## 2019-09-09 ENCOUNTER — Encounter (HOSPITAL_COMMUNITY): Payer: Self-pay | Admitting: *Deleted

## 2019-09-09 ENCOUNTER — Other Ambulatory Visit: Payer: Self-pay

## 2019-09-09 DIAGNOSIS — I2510 Atherosclerotic heart disease of native coronary artery without angina pectoris: Secondary | ICD-10-CM | POA: Diagnosis not present

## 2019-09-09 DIAGNOSIS — Z79899 Other long term (current) drug therapy: Secondary | ICD-10-CM | POA: Insufficient documentation

## 2019-09-09 DIAGNOSIS — N183 Chronic kidney disease, stage 3 unspecified: Secondary | ICD-10-CM | POA: Insufficient documentation

## 2019-09-09 DIAGNOSIS — I129 Hypertensive chronic kidney disease with stage 1 through stage 4 chronic kidney disease, or unspecified chronic kidney disease: Secondary | ICD-10-CM | POA: Insufficient documentation

## 2019-09-09 DIAGNOSIS — Z87891 Personal history of nicotine dependence: Secondary | ICD-10-CM | POA: Insufficient documentation

## 2019-09-09 DIAGNOSIS — J441 Chronic obstructive pulmonary disease with (acute) exacerbation: Secondary | ICD-10-CM | POA: Diagnosis not present

## 2019-09-09 DIAGNOSIS — Y999 Unspecified external cause status: Secondary | ICD-10-CM | POA: Insufficient documentation

## 2019-09-09 DIAGNOSIS — Z7982 Long term (current) use of aspirin: Secondary | ICD-10-CM | POA: Diagnosis not present

## 2019-09-09 DIAGNOSIS — Y9389 Activity, other specified: Secondary | ICD-10-CM | POA: Insufficient documentation

## 2019-09-09 DIAGNOSIS — R0602 Shortness of breath: Secondary | ICD-10-CM | POA: Diagnosis present

## 2019-09-09 DIAGNOSIS — S51811A Laceration without foreign body of right forearm, initial encounter: Secondary | ICD-10-CM

## 2019-09-09 DIAGNOSIS — Y929 Unspecified place or not applicable: Secondary | ICD-10-CM | POA: Insufficient documentation

## 2019-09-09 DIAGNOSIS — E039 Hypothyroidism, unspecified: Secondary | ICD-10-CM | POA: Insufficient documentation

## 2019-09-09 DIAGNOSIS — W2203XA Walked into furniture, initial encounter: Secondary | ICD-10-CM | POA: Diagnosis not present

## 2019-09-09 LAB — BASIC METABOLIC PANEL
Anion gap: 9 (ref 5–15)
BUN: 23 mg/dL (ref 8–23)
CO2: 25 mmol/L (ref 22–32)
Calcium: 8.9 mg/dL (ref 8.9–10.3)
Chloride: 104 mmol/L (ref 98–111)
Creatinine, Ser: 1.18 mg/dL — ABNORMAL HIGH (ref 0.44–1.00)
GFR calc Af Amer: 46 mL/min — ABNORMAL LOW (ref >60)
GFR calc non Af Amer: 40 mL/min — ABNORMAL LOW (ref >60)
Glucose, Bld: 145 mg/dL — ABNORMAL HIGH (ref 70–99)
Potassium: 3.4 mmol/L — ABNORMAL LOW (ref 3.5–5.1)
Sodium: 138 mmol/L (ref 135–145)

## 2019-09-09 LAB — CBC WITH DIFFERENTIAL/PLATELET
Abs Immature Granulocytes: 0.01 10*3/uL (ref 0.00–0.07)
Basophils Absolute: 0.1 10*3/uL (ref 0.0–0.1)
Basophils Relative: 1 %
Eosinophils Absolute: 0.1 10*3/uL (ref 0.0–0.5)
Eosinophils Relative: 2 %
HCT: 41.3 % (ref 36.0–46.0)
Hemoglobin: 13.3 g/dL (ref 12.0–15.0)
Immature Granulocytes: 0 %
Lymphocytes Relative: 12 %
Lymphs Abs: 0.8 10*3/uL (ref 0.7–4.0)
MCH: 30.5 pg (ref 26.0–34.0)
MCHC: 32.2 g/dL (ref 30.0–36.0)
MCV: 94.7 fL (ref 80.0–100.0)
Monocytes Absolute: 0.7 10*3/uL (ref 0.1–1.0)
Monocytes Relative: 10 %
Neutro Abs: 5.2 10*3/uL (ref 1.7–7.7)
Neutrophils Relative %: 75 %
Platelets: 165 10*3/uL (ref 150–400)
RBC: 4.36 MIL/uL (ref 3.87–5.11)
RDW: 14 % (ref 11.5–15.5)
WBC: 6.8 10*3/uL (ref 4.0–10.5)
nRBC: 0 % (ref 0.0–0.2)

## 2019-09-09 LAB — BRAIN NATRIURETIC PEPTIDE: B Natriuretic Peptide: 112.3 pg/mL — ABNORMAL HIGH (ref 0.0–100.0)

## 2019-09-09 MED ORDER — METHYLPREDNISOLONE SODIUM SUCC 125 MG IJ SOLR: 125.0000 mg | Freq: Once | INTRAMUSCULAR | Status: DC

## 2019-09-09 MED ORDER — IPRATROPIUM-ALBUTEROL 0.5-2.5 (3) MG/3ML IN SOLN
3.0000 mL | Freq: Once | RESPIRATORY_TRACT | Status: AC
  Administered 2019-09-09: 3 mL via RESPIRATORY_TRACT
  Filled 2019-09-09: qty 3

## 2019-09-09 MED ORDER — LIDOCAINE HCL URETHRAL/MUCOSAL 2 % EX GEL
1.0000 "application " | Freq: Once | CUTANEOUS | Status: AC
  Administered 2019-09-09: 1 via TOPICAL
  Filled 2019-09-09: qty 20

## 2019-09-09 MED ORDER — PREDNISONE 20 MG PO TABS: 40.0000 mg | ORAL_TABLET | Freq: Every day | ORAL | 0 refills | Status: DC

## 2019-09-09 MED ORDER — PREDNISONE 20 MG PO TABS
10.0000 mg | ORAL_TABLET | Freq: Once | ORAL | Status: AC
  Administered 2019-09-09: 12:00:00 10 mg via ORAL
  Filled 2019-09-09: qty 1

## 2019-09-09 NOTE — ED Notes (Signed)
Daughter- Jose Persia [facility]) [phone]. If someone could please call her, she is asking about coming up here and being with the pt

## 2019-09-09 NOTE — ED Triage Notes (Signed)
Pt woke up and was short of  Breath. Pt took a breathing treatment with out relief . Pt called EMS.

## 2019-09-09 NOTE — ED Notes (Signed)
Patient verbalizes understanding of discharge instructions. Opportunity for questioning and answers were provided. Armband removed by staff. Patient discharged from ED.  

## 2019-09-09 NOTE — ED Provider Notes (Addendum)
[facility] EMERGENCY DEPARTMENT Provider Note   CSN: [id number] Arrival date & time: 09/09/19  [id number]     History Chief Complaint  Patient presents with  . Shortness of Breath    [name] is a 86 y.o. female.  Pt presents to the ED today with sob and a skin tear to her right forearm.  The pt said she woke up around 0500 and had accidentally hit her arm against her bed.  She sustained a skin tear.  She did call EMS out and they told her to go to the ED for eval.  She did not want to go with EMS, so she was getting ready to go and became very sob.  She took her neb this morning which helped, but did not relieve sx completely.  Pt then called EMS again and came with them to the ED.  Pt has not had a fever.  She's had both doses of the Covid vaccine.         Past Medical History:  Diagnosis Date  . Anxiety   . Aortic arch atherosclerosis ([place]) 06/24/2014  . Atherosclerotic ulcer of aorta ([place]) 06/24/2014  . Carotid artery occlusion   . [place] DISEASE, LUMBAR 12/16/2008  . DIVERTICULOSIS, COLON 09/30/2008  . DYSPNEA 07/13/2008  . Graves disease   . History of embolic stroke [id number]   Left brain  . HYPERLIPIDEMIA 03/06/2007  . HYPERTENSION 03/06/2007  . HYPOTHYROIDISM 10/13/2007  . OSTEOARTHRITIS 03/06/2007  . Personal history of colonic polyps 09/30/2008  . Stroke ([place])    06/2014           . TOBACCO USE, QUIT 04/12/2009  . WEAKNESS 11/09/2007    Patient Active Problem List   Diagnosis Date Noted  . Advice given about COVID-19 virus infection 07/21/2019  . Current chronic use of systemic steroids 07/13/2019  . Medication management 04/02/2018  . COPD exacerbation ([place]) 03/31/2018  . Hypokalemia 03/30/2018  . COPD with acute exacerbation ([place]) 03/02/2018  . SOB (shortness of breath) 01/12/2018  . CKD (chronic kidney disease), stage III 01/12/2018  . New onset left bundle branch block (LBBB) 01/12/2018  . COPD GOLD I D 01/12/2018  . Left buttock pain  09/13/2017  . Symptomatic anemia   . AVM (arteriovenous malformation) of duodenum, acquired with hemorrhage   . Acute GI bleeding 10/09/2016  . Other emphysema ([place]) 05/10/2016  . Recurrent laryngeal neuropathy 02/01/2016  . Nausea with vomiting 12/11/2015  . Left carotid stenosis 11/21/2015  . Asymptomatic carotid artery stenosis 11/13/2015  . Impaired glucose tolerance 10/27/2015  . Solitary pulmonary nodule 10/27/2015  . Thyroid nodule 10/27/2015  . Syncope 10/04/2015  . Bradycardia with less than 60 beats per minute 10/04/2015  . History of embolic stroke [id number]  . Paresthesia of both feet 07/06/2014  . Aortic arch atherosclerosis ([place]) 06/24/2014  . Atherosclerotic ulcer of aorta ([place]) 06/24/2014  . Stroke ([place]) 06/22/2014  . Bilateral carotid artery disease ([place]) 05/18/2014  . TOBACCO USE, QUIT 04/12/2009  . [place] DISEASE, LUMBAR 12/16/2008  . DIVERTICULOSIS, COLON 09/30/2008  . Personal history of colonic polyps 09/30/2008  . DYSPNEA 07/13/2008  . Weakness 11/09/2007  . Hypothyroidism 10/13/2007  . Hyperlipidemia 03/06/2007  . Essential hypertension 03/06/2007  . Osteoarthritis 03/06/2007    Past Surgical History:  Procedure Laterality Date  . CARDIAC CATHETERIZATION    . CATARACT EXTRACTION Bilateral   . CHOLECYSTECTOMY    . ENDARTERECTOMY Left 11/13/2015   Procedure: LEFT CAROTID ARTERY ENDARTERECTOMY;  Surgeon:  Conrad Homeland, MD;  Location: [facility];  Service: Vascular;  Laterality: Left;  . ESOPHAGOGASTRODUODENOSCOPY (EGD) WITH PROPOFOL N/A 10/11/2016   Procedure: ESOPHAGOGASTRODUODENOSCOPY (EGD) WITH PROPOFOL;  Surgeon: Mauri Pole, MD;  Location: [facility];  Service: Endoscopy;  Laterality: N/A;  . KNEE SURGERY    . PATCH ANGIOPLASTY Left 11/13/2015   Procedure: WITH 1CM X 6CM  XENOSURE BIOLOGIC PATCH ANGIOPLASTY;  Surgeon: Conrad Southview, MD;  Location: [facility];  Service: Vascular;  Laterality: Left;  . TEE WITHOUT CARDIOVERSION N/A 06/24/2014   Procedure:  TRANSESOPHAGEAL ECHOCARDIOGRAM (TEE);  Surgeon: Sanda Klein, MD;  Location[facility]COPY;  Service: Cardiovascular;  Laterality: N/A;     OB History   No obstetric history on file.     Family History  Problem Relation Age of Onset  . Stomach cancer Maternal Grandmother   . Colon cancer Neg Hx     Social History   Tobacco Use  . Smoking status: Former Smoker    Packs/day: 1.50    Years: 29.00    Pack years: 43.50    Types: Cigarettes    Start date: 07/08/1949    Quit date: 07/08/1978    Years since quitting: 41.2  . Smokeless tobacco: Never Used  Substance Use Topics  . Alcohol use: Yes    Alcohol/week: 1.0 standard drinks    Types: 1 Glasses of wine per week    Comment: "red wine"- some nights  . Drug use: No    Home Medications Prior to Admission medications   Medication Sig Start Date End Date Taking? Authorizing Provider  albuterol (PROAIR HFA) 108 (90 Base) MCG/ACT inhaler Inhale 2 puffs into the lungs every 6 (six) hours as needed for wheezing or shortness of breath. 11/13/17   Volanda Napoleon, PA-C  amLODipine (NORVASC) 10 MG tablet Take 0.5 tablets (5 mg total) by mouth daily. 02/16/19   Burtis Junes, NP  aspirin EC 81 MG tablet Take one tablet by mouth 5 days per week 01/25/19   Burtis Junes, NP  atorvastatin (LIPITOR) 80 MG tablet TAKE 1 TABLET BY MOUTH EVERY DAY AT [facility] 07/13/19   Nafziger, Tommi Rumps, NP  Fluticasone-Umeclidin-Vilant (TRELEGY ELLIPTA) 100-62.5-25 MCG/INH AEPB Inhale 1 puff into the lungs daily. 06/21/19   Mannam, Hart Robinsons, MD  Glycerin-Hypromellose-PEG 400 ([facility] DRY EYE RELIEF) 0.2-0.2-1 % SOLN Place 1 drop into both eyes as needed (dry eyes).     [provider]  hydrALAZINE (APRESOLINE) 25 MG tablet TAKE 3 TABLETS BY MOUTH 3 TIMES A DAY 08/30/19   Burtis Junes, NP  hydrochlorothiazide (HYDRODIURIL) 25 MG tablet Take 1 tablet (25 mg total) by mouth every other day. 04/06/19   Nafziger, Tommi Rumps, NP  ipratropium-albuterol (DUONEB) 0.5-2.5 (3)  MG/3ML SOLN Take 3 mLs by nebulization every 6 (six) hours as needed. 06/21/19   Mannam, Hart Robinsons, MD  latanoprost (XALATAN) 0.005 % ophthalmic solution Place 1 drop into both eyes at bedtime.  04/02/19   [provider]  levothyroxine (SYNTHROID) 75 MCG tablet TAKE 1 TABLET BY MOUTH EVERY DAY BEFORE BREAKFAST 07/16/19   Nafziger, Tommi Rumps, NP  Lidocaine 4 % PTCH Apply 1 patch topically 2 (two) times daily. 09/02/18   Varney Biles, MD  predniSONE (DELTASONE) 10 MG tablet Take 0.5 tablets (5 mg total) by mouth daily with breakfast. 06/21/19   Marshell Garfinkel, MD  Spacer/Aero-Holding Chambers (AEROCHAMBER MV) inhaler Use as instructed 11/26/17   Magdalen Spatz, NP    Allergies    Catapres [clonidine hcl],  Lisinopril, and Labetalol hcl  Review of Systems   Review of Systems  Respiratory: Positive for shortness of breath.   Skin: Positive for wound.  All other systems reviewed and are negative.   Physical Exam Updated Vital Signs BP (!) 146/70   Pulse 76   Temp 97.7 F (36.5 C) (Oral)   Resp (!) 26   Ht 5\' 6"  (1.676 m)   Wt 61.7 kg   SpO2 93%   BMI 21.95 kg/m   Physical Exam Vitals and nursing note reviewed.  Constitutional:      Appearance: She is well-developed.  HENT:     Head: Normocephalic and atraumatic.  Eyes:     Extraocular Movements: Extraocular movements intact.     Pupils: Pupils are equal, round, and reactive to light.  Cardiovascular:     Rate and Rhythm: Normal rate and regular rhythm.  Pulmonary:     Effort: Tachypnea present.  Abdominal:     General: Bowel sounds are normal.     Palpations: Abdomen is soft.  Musculoskeletal:        General: Normal range of motion.     Cervical back: Normal range of motion and neck supple.  Skin:    General: Skin is warm.     Capillary Refill: Capillary refill takes less than 2 seconds.     Comments: Skin tear to right forearm  Neurological:     General: No focal deficit present.     Mental Status: She is alert  and oriented to person, place, and time.  Psychiatric:        Mood and Affect: Mood normal.        Behavior: Behavior normal.     ED Results / Procedures / Treatments   Labs (all labs ordered are listed, but only abnormal results are displayed) Labs Reviewed  BASIC METABOLIC PANEL - Abnormal; Notable for the following components:      Result Value   Potassium 3.4 (*)    Glucose, Bld 145 (*)    Creatinine, Ser 1.18 (*)    GFR calc non Af Amer 40 (*)    GFR calc Af Amer 46 (*)    All other components within normal limits  BRAIN NATRIURETIC PEPTIDE - Abnormal; Notable for the following components:   B Natriuretic Peptide 112.3 (*)    All other components within normal limits  CBC WITH DIFFERENTIAL/PLATELET    EKG EKG Interpretation  Date/Time:  Thursday September 09 2019 09:41:31 EST Ventricular Rate:  71 PR Interval:    QRS Duration: 83 QT Interval:  407 QTC Calculation: 443 R Axis:   61 Text Interpretation: Sinus rhythm Atrial premature complexes Consider left atrial enlargement Anterior infarct, old No significant change since last tracing Confirmed by Isla Pence [phone]) on 09/09/2019 10:50:36 AM   Radiology DG Chest Port 1 View  Result Date: 09/09/2019 CLINICAL DATA:  Shortness of breath. EXAM: PORTABLE CHEST 1 VIEW COMPARISON:  September 01, 2019. FINDINGS: Stable cardiomegaly. Atherosclerosis of thoracic aorta is noted. No pneumothorax is noted. Right lung is clear. Mild left basilar atelectasis is noted with small left pleural effusion. Bony thorax is unremarkable. IMPRESSION: Aortic atherosclerosis. Mild left basilar subsegmental atelectasis with small left pleural effusion. Aortic Atherosclerosis (ICD10-I70.0). Electronically Signed   By: Marijo Conception M.D.   On: 09/09/2019 10:16    Procedures .[name] KitchenLaceration Repair  Date/Time: 09/20/2019 3:14 PM Performed by: Isla Pence, MD Authorized by: Isla Pence, MD   Consent:    Consent obtained:  Verbal   Consent  given by:  Patient   Alternatives discussed:  No treatment Anesthesia (see MAR for exact dosages):    Anesthesia method:  None Laceration details:    Location:  Shoulder/arm   Shoulder/arm location:  R lower arm   Length (cm):  3 Repair type:    Repair type:  Intermediate Exploration:    Contaminated: no   Treatment:    Area cleansed with:  Saline   Amount of cleaning:  Standard   Irrigation solution:  Sterile saline Skin repair:    Repair method:  Tissue adhesive and Steri-Strips Post-procedure details:    Patient tolerance of procedure:  Tolerated well, no immediate complications Comments:     Pt's skin tear repaired with dermabond and steri-strips.   (including critical care time)  Medications Ordered in ED Medications  lidocaine (XYLOCAINE) 2 % jelly 1 application (has no administration in time range)  ipratropium-albuterol (DUONEB) 0.5-2.5 (3) MG/3ML nebulizer solution 3 mL (has no administration in time range)  predniSONE (DELTASONE) tablet 10 mg (has no administration in time range)    ED Course  I have reviewed the triage vital signs and the nursing notes.  Pertinent labs & imaging results that were available during my care of the patient were reviewed by me and considered in my medical decision making (see chart for details).    MDM Rules/Calculators/A&P                      Skin tear repaired by PA Lawyer.   Pt's breathing has improved.  Pt did not want an IV dose of medication.  She only wanted 10 mg of prednisone.  Pt will be d/c home with instructions to take 10 mg prednisone for a few days and go back down to her usual 5 mg when her breathing improves.  Pt knows to return if worse.  Pt's daughter updated by phone.  Final Clinical Impression(s) / ED Diagnoses Final diagnoses:  COPD exacerbation ([place])  Skin tear of right forearm without complication, initial encounter    Rx / DC Orders ED Discharge Orders         Ordered    predniSONE (DELTASONE) 20  MG tablet  Daily,   Status:  Discontinued     09/09/19 [place], Noreene Boreman, MD 09/09/19 1132    Isla Pence, MD 09/20/19 1514    Isla Pence, MD 09/29/19 [phone]

## 2019-09-13 ENCOUNTER — Other Ambulatory Visit: Payer: Self-pay

## 2019-09-14 ENCOUNTER — Encounter: Payer: Self-pay | Admitting: Family Medicine

## 2019-09-14 ENCOUNTER — Ambulatory Visit (INDEPENDENT_AMBULATORY_CARE_PROVIDER_SITE_OTHER): Payer: Medicare PPO | Admitting: Family Medicine

## 2019-09-14 ENCOUNTER — Other Ambulatory Visit: Payer: Self-pay

## 2019-09-14 VITALS — BP 150/76 | HR 110 | Temp 98.3°F | Ht 66.0 in | Wt 140.3 lb

## 2019-09-14 DIAGNOSIS — S51811D Laceration without foreign body of right forearm, subsequent encounter: Secondary | ICD-10-CM | POA: Diagnosis not present

## 2019-09-14 DIAGNOSIS — J42 Unspecified chronic bronchitis: Secondary | ICD-10-CM | POA: Diagnosis not present

## 2019-09-14 NOTE — Patient Instructions (Signed)
Set up follow-up with [facility] in about 10 days  Recommend change outer dressing every 2 days or so and follow-up for any signs of secondary infection such as increased redness or swelling  Clean gently around the wound every couple days with soap and water

## 2019-09-14 NOTE — Progress Notes (Signed)
Subjective:     Patient ID: [name], female   DOB: 05-23-1925, 86 y.o.   MRN: [id number]  HPI Ms. Mellies is seen for ER follow-up.  She states that Thursday she rolled over in bed and when turning over apparently her left wrist struck against her right wrist and pulled back some skin.  She had significant amount of bleeding.  She called EMS.  They applied pressure wrap.  She went to ER for further evaluation.  She has chronic COPD and developed some increased dyspnea when there.  She had chest x-ray which showed no pneumonia.  She was given nebulizer treatment and did improve.  She feels back to baseline with regard to her breathing at this time.  She is on some prednisone chronically which makes her skin even more fragile.  Recent ER notes reviewed.  She has not had a tetanus booster since 2010 but had her recent second Covid vaccine and declines vaccination at this time.  She had glue and Steri-Strips applied to her wound.  Denied any other injuries.  She has not taken off her outer bandage since she was in the ER last Thursday until now.  Past Medical History:  Diagnosis Date  . Anxiety   . Aortic arch atherosclerosis ([place]) 06/24/2014  . Atherosclerotic ulcer of aorta ([place]) 06/24/2014  . Carotid artery occlusion   . [place] DISEASE, LUMBAR 12/16/2008  . DIVERTICULOSIS, COLON 09/30/2008  . DYSPNEA 07/13/2008  . Graves disease   . History of embolic stroke [id number]   Left brain  . HYPERLIPIDEMIA 03/06/2007  . HYPERTENSION 03/06/2007  . HYPOTHYROIDISM 10/13/2007  . OSTEOARTHRITIS 03/06/2007  . Personal history of colonic polyps 09/30/2008  . Stroke ([place])    06/2014           . TOBACCO USE, QUIT 04/12/2009  . WEAKNESS 11/09/2007   Past Surgical History:  Procedure Laterality Date  . CARDIAC CATHETERIZATION    . CATARACT EXTRACTION Bilateral   . CHOLECYSTECTOMY    . ENDARTERECTOMY Left 11/13/2015   Procedure: LEFT CAROTID ARTERY ENDARTERECTOMY;  Surgeon: Conrad Bluffs, MD;  Location: [facility];   Service: Vascular;  Laterality: Left;  . ESOPHAGOGASTRODUODENOSCOPY (EGD) WITH PROPOFOL N/A 10/11/2016   Procedure: ESOPHAGOGASTRODUODENOSCOPY (EGD) WITH PROPOFOL;  Surgeon: Mauri Pole, MD;  [facility];  Service: Endoscopy;  Laterality: N/A;  . KNEE SURGERY    . PATCH ANGIOPLASTY Left 11/13/2015   Procedure: WITH 1CM X 6CM  XENOSURE BIOLOGIC PATCH ANGIOPLASTY;  Surgeon: Conrad [place], MD;  Location: [facility];  Service: Vascular;  Laterality: Left;  . TEE WITHOUT CARDIOVERSION N/A 06/24/2014   Procedure: TRANSESOPHAGEAL ECHOCARDIOGRAM (TEE);  Surgeon: Sanda Klein, MD;  Location: [facility]COPY;  Service: Cardiovascular;  Laterality: N/A;    reports that she quit smoking about 41 years ago. Her smoking use included cigarettes. She started smoking about 70 years ago. She has a 43.50 pack-year smoking history. She has never used smokeless tobacco. She reports current alcohol use of about 1.0 standard drinks of alcohol per week. She reports that she does not use drugs. family history includes Stomach cancer in her maternal grandmother. Allergies  Allergen Reactions  . Catapres [Clonidine Hcl] Other (See Comments)    Made pt feel horrible, shaky, weak and nausea  . Lisinopril Anaphylaxis    Tongue swelling  . Labetalol Hcl Other (See Comments)    Caused bradycardia and syncope     Review of Systems  Constitutional: Negative for chills and fever.  Respiratory:  Negative for cough and wheezing.   Cardiovascular: Negative for chest pain and leg swelling.  Neurological: Negative for dizziness and syncope.  Psychiatric/Behavioral: Negative for confusion.       Objective:   Physical Exam Vitals reviewed.  Constitutional:      Appearance: Normal appearance.  Cardiovascular:     Rate and Rhythm: Normal rate and regular rhythm.  Skin:    Comments: She has a laceration involving the right forearm and has several Steri-Strips that are in place.  There is some crusted blood around  the laceration site.  No cellulitis changes.  No purulent secretions.  Neurological:     Mental Status: She is alert.        Assessment:     #1 flap laceration with very thin skin tear right forearm-no signs of secondary infection  #2 chronic COPD currently symptomatically stable at baseline    Plan:     -Wound is cleaned and redressed -Recommend change outer dressing within a couple days and every 2 or 3 days and also recommend wound follow-up here in about 10 days with primary to reassess -Follow-up sooner for any signs of infection such as increased redness or swelling  Eulas Post MD [facility]

## 2019-09-24 ENCOUNTER — Other Ambulatory Visit: Payer: Self-pay

## 2019-09-24 ENCOUNTER — Ambulatory Visit (INDEPENDENT_AMBULATORY_CARE_PROVIDER_SITE_OTHER): Payer: Medicare PPO | Admitting: Adult Health

## 2019-09-24 ENCOUNTER — Encounter: Payer: Self-pay | Admitting: Adult Health

## 2019-09-24 VITALS — BP 156/70 | Temp 97.9°F | Wt 142.0 lb

## 2019-09-24 DIAGNOSIS — S51811A Laceration without foreign body of right forearm, initial encounter: Secondary | ICD-10-CM | POA: Diagnosis not present

## 2019-09-24 NOTE — Progress Notes (Signed)
Subjective:    Patient ID: [name], female    DOB: 1924-09-18, 86 y.o.   MRN: [id number]  HPI  86 year old female who  has a past medical history of Anxiety, Aortic arch atherosclerosis ([place]) (06/24/2014), Atherosclerotic ulcer of aorta ([place]) (06/24/2014), Carotid artery occlusion, DISC DISEASE, LUMBAR (12/16/2008), DIVERTICULOSIS, COLON (09/30/2008), DYSPNEA (07/13/2008), Graves disease, History of embolic stroke (XX123456), HYPERLIPIDEMIA (03/06/2007), HYPERTENSION (03/06/2007), HYPOTHYROIDISM (10/13/2007), OSTEOARTHRITIS (03/06/2007), Personal history of colonic polyps (09/30/2008), Stroke ([place]), TOBACCO USE, QUIT (04/12/2009), and WEAKNESS (11/09/2007).  She presents today for follow-up regarding wound.  She was seen 10 days ago by another provider in the office after an ER visit.  She had rolled over in bed and when turning over she apparently hit her left wrist against her right wrist and pulled back some skin.  She had a significant amount of bleeding and EMS was called.  She went to the ER for further evaluation of the wound was repaired using glue and Steri-Strips.  When she was evaluated in the office the wound was clean and dry and did not show any signs of infection.  It was recommended changing the outer dressing every 2 to 3 days and to follow-up in 10 days for reassessment.  She reports that she has been keeping the wound clean and dry. Her steri strips fell off today. Denies symptoms of infections   Review of Systems See HPI   Past Medical History:  Diagnosis Date  . Anxiety   . Aortic arch atherosclerosis ([place]) 06/24/2014  . Atherosclerotic ulcer of aorta ([place]) 06/24/2014  . Carotid artery occlusion   . [place] DISEASE, LUMBAR 12/16/2008  . DIVERTICULOSIS, COLON 09/30/2008  . DYSPNEA 07/13/2008  . Graves disease   . History of embolic stroke [id number]   Left brain  . HYPERLIPIDEMIA 03/06/2007  . HYPERTENSION 03/06/2007  . HYPOTHYROIDISM 10/13/2007  . OSTEOARTHRITIS 03/06/2007  .  Personal history of colonic polyps 09/30/2008  . Stroke ([place])    06/2014           . TOBACCO USE, QUIT 04/12/2009  . WEAKNESS 11/09/2007    Social History   Socioeconomic History  . Marital status: Divorced    Spouse name: Not on file  . Number of children: 4  . Years of education: college  . Highest education level: Not on file  Occupational History  . Occupation: retired  Tobacco Use  . Smoking status: Former Smoker    Packs/day: 1.50    Years: 29.00    Pack years: 43.50    Types: Cigarettes    Start date: 07/08/1949    Quit date: 07/08/1978    Years since quitting: 41.2  . Smokeless tobacco: Never Used  Substance and Sexual Activity  . Alcohol use: Yes    Alcohol/week: 1.0 standard drinks    Types: 1 Glasses of wine per week    Comment: "red wine"- some nights  . Drug use: No  . Sexual activity: Not on file  Other Topics Concern  . Not on file  Social History Narrative      Patient is right handed.   Patient drinks 1 cup caffeine daily.   Social Determinants of Health   Financial Resource Strain:   . Difficulty of Paying Living Expenses:   Food Insecurity:   . Worried About Charity fundraiser in the Last Year:   . Arboriculturist in the Last Year:   Transportation Needs:   . Film/video editor (Medical):   [name] Kitchen  Lack of Transportation (Non-Medical):   Physical Activity:   . Days of Exercise per Week:   . Minutes of Exercise per Session:   Stress:   . Feeling of Stress :   Social Connections:   . Frequency of Communication with Friends and Family:   . Frequency of Social Gatherings with Friends and Family:   . Attends Religious Services:   . Active Member of Clubs or Organizations:   . Attends Archivist Meetings:   [name] Kitchen Marital Status:   Intimate Partner Violence:   . Fear of Current or Ex-Partner:   . Emotionally Abused:   [name] Kitchen Physically Abused:   . Sexually Abused:     Past Surgical History:  Procedure Laterality Date  . CARDIAC  CATHETERIZATION    . CATARACT EXTRACTION Bilateral   . CHOLECYSTECTOMY    . ENDARTERECTOMY Left 11/13/2015   Procedure: LEFT CAROTID ARTERY ENDARTERECTOMY;  Surgeon: Conrad Sidon, MD;  Location: [facility];  Service: Vascular;  Laterality: Left;  . ESOPHAGOGASTRODUODENOSCOPY (EGD) WITH PROPOFOL N/A 10/11/2016   Procedure: ESOPHAGOGASTRODUODENOSCOPY (EGD) WITH PROPOFOL;  Surgeon: Mauri Pole, MD;  Location: [facility];  Service: Endoscopy;  Laterality: N/A;  . KNEE SURGERY    . PATCH ANGIOPLASTY Left 11/13/2015   Procedure: WITH 1CM X 6CM  XENOSURE BIOLOGIC PATCH ANGIOPLASTY;  Surgeon: Conrad Sophia, MD;  Location: [facility];  Service: Vascular;  Laterality: Left;  . TEE WITHOUT CARDIOVERSION N/A 06/24/2014   Procedure: TRANSESOPHAGEAL ECHOCARDIOGRAM (TEE);  Surgeon: Sanda Klein, MD;  Location: [facility] ENDOSCOPY;  Service: Cardiovascular;  Laterality: N/A;    Family History  Problem Relation Age of Onset  . Stomach cancer Maternal Grandmother   . Colon cancer Neg Hx     Allergies  Allergen Reactions  . Catapres [Clonidine Hcl] Other (See Comments)    Made pt feel horrible, shaky, weak and nausea  . Lisinopril Anaphylaxis    Tongue swelling  . Labetalol Hcl Other (See Comments)    Caused bradycardia and syncope    Current Outpatient Medications on File Prior to Visit  Medication Sig Dispense Refill  . albuterol (PROAIR HFA) 108 (90 Base) MCG/ACT inhaler Inhale 2 puffs into the lungs every 6 (six) hours as needed for wheezing or shortness of breath. 8.5 Inhaler 5  . amLODipine (NORVASC) 10 MG tablet Take 0.5 tablets (5 mg total) by mouth daily. 90 tablet 3  . aspirin EC 81 MG tablet Take one tablet by mouth 5 days per week 90 tablet 3  . atorvastatin (LIPITOR) 80 MG tablet TAKE 1 TABLET BY MOUTH EVERY DAY AT 6PM 90 tablet 1  . Fluticasone-Umeclidin-Vilant (TRELEGY ELLIPTA) 100-62.5-25 MCG/INH AEPB Inhale 1 puff into the lungs daily. 60 each 5  . Glycerin-Hypromellose-PEG 400 ([facility] DRY EYE  RELIEF) 0.2-0.2-1 % SOLN Place 1 drop into both eyes as needed (dry eyes).     . hydrALAZINE (APRESOLINE) 25 MG tablet TAKE 3 TABLETS BY MOUTH 3 TIMES A DAY 270 tablet 11  . hydrochlorothiazide (HYDRODIURIL) 25 MG tablet Take 1 tablet (25 mg total) by mouth every other day. 45 tablet 1  . ipratropium-albuterol (DUONEB) 0.5-2.5 (3) MG/3ML SOLN Take 3 mLs by nebulization every 6 (six) hours as needed. 360 mL 3  . latanoprost (XALATAN) 0.005 % ophthalmic solution Place 1 drop into both eyes at bedtime.     [name] Kitchen levothyroxine (SYNTHROID) 75 MCG tablet TAKE 1 TABLET BY MOUTH EVERY DAY BEFORE BREAKFAST 90 tablet 3  . Lidocaine 4 % PTCH Apply 1  patch topically 2 (two) times daily. 12 patch 0  . predniSONE (DELTASONE) 10 MG tablet Take 0.5 tablets (5 mg total) by mouth daily with breakfast. 30 tablet 3  . Spacer/Aero-Holding Chambers (AEROCHAMBER MV) inhaler Use as instructed 1 each 0   No current facility-administered medications on file prior to visit.    There were no vitals taken for this visit.      Objective:   Physical Exam Vitals and nursing note reviewed.  Constitutional:      Appearance: Normal appearance.  Skin:    General: Skin is warm and dry.     Comments: She has a well healing wound involving the right forearm. There are no signs of infection noted.      Neurological:     General: No focal deficit present.     Mental Status: She is alert and oriented to person, place, and time.  Psychiatric:        Mood and Affect: Mood normal.        Behavior: Behavior normal.        Thought Content: Thought content normal.        Judgment: Judgment normal.       Assessment & Plan:  1. Skin tear of right forearm without complication, initial encounter - Wound appears to be healing well.   We redressed the wound with non stick gauze. Continue to change dressing every 2 days for the next week and then can keep dressing off.   - Follow up with any signs of infection   [name],  NP

## 2019-10-19 ENCOUNTER — Encounter: Payer: Medicare PPO | Admitting: Adult Health

## 2019-12-01 ENCOUNTER — Ambulatory Visit (INDEPENDENT_AMBULATORY_CARE_PROVIDER_SITE_OTHER): Payer: Medicare PPO | Admitting: Adult Health

## 2019-12-01 ENCOUNTER — Encounter: Payer: Self-pay | Admitting: Adult Health

## 2019-12-01 ENCOUNTER — Other Ambulatory Visit: Payer: Self-pay

## 2019-12-01 VITALS — BP 156/76 | HR 82 | Temp 98.0°F | Wt 138.0 lb

## 2019-12-01 DIAGNOSIS — R630 Anorexia: Secondary | ICD-10-CM

## 2019-12-01 DIAGNOSIS — R5383 Other fatigue: Secondary | ICD-10-CM

## 2019-12-01 LAB — BASIC METABOLIC PANEL
BUN: 20 mg/dL (ref 6–23)
CO2: 26 mEq/L (ref 19–32)
Calcium: 9 mg/dL (ref 8.4–10.5)
Chloride: 104 mEq/L (ref 96–112)
Creatinine, Ser: 1.07 mg/dL (ref 0.40–1.20)
GFR: 47.91 mL/min — ABNORMAL LOW (ref >60.00)
Glucose, Bld: 133 mg/dL — ABNORMAL HIGH (ref 70–99)
Potassium: 3.8 mEq/L (ref 3.5–5.1)
Sodium: 141 mEq/L (ref 135–145)

## 2019-12-01 LAB — IBC + FERRITIN
Ferritin: 19.4 ng/mL (ref 10.0–291.0)
Iron: 55 ug/dL (ref 42–145)
Saturation Ratios: 14.6 % — ABNORMAL LOW (ref 20.0–50.0)
Transferrin: 270 mg/dL (ref 212.0–360.0)

## 2019-12-01 LAB — CBC WITH DIFFERENTIAL/PLATELET
Basophils Absolute: 0 10*3/uL (ref 0.0–0.1)
Basophils Relative: 0.6 % (ref 0.0–3.0)
Eosinophils Absolute: 0.1 10*3/uL (ref 0.0–0.7)
Eosinophils Relative: 0.9 % (ref 0.0–5.0)
HCT: 40.1 % (ref 36.0–46.0)
Hemoglobin: 13.3 g/dL (ref 12.0–15.0)
Lymphocytes Relative: 9.2 % — ABNORMAL LOW (ref 12.0–46.0)
Lymphs Abs: 0.7 10*3/uL (ref 0.7–4.0)
MCHC: 33.1 g/dL (ref 30.0–36.0)
MCV: 90 fl (ref 78.0–100.0)
Monocytes Absolute: 0.6 10*3/uL (ref 0.1–1.0)
Monocytes Relative: 8 % (ref 3.0–12.0)
Neutro Abs: 6 10*3/uL (ref 1.4–7.7)
Neutrophils Relative %: 81.3 % — ABNORMAL HIGH (ref 43.0–77.0)
Platelets: 191 10*3/uL (ref 150.0–400.0)
RBC: 4.46 Mil/uL (ref 3.87–5.11)
RDW: 14.6 % (ref 11.5–15.5)
WBC: 7.4 10*3/uL (ref 4.0–10.5)

## 2019-12-01 LAB — URINALYSIS, ROUTINE W REFLEX MICROSCOPIC
Bilirubin Urine: NEGATIVE
Hgb urine dipstick: NEGATIVE
Ketones, ur: NEGATIVE
Nitrite: NEGATIVE
Specific Gravity, Urine: 1.015 (ref 1.000–1.030)
Total Protein, Urine: 30 — AB
Urine Glucose: NEGATIVE
Urobilinogen, UA: 0.2 (ref 0.0–1.0)
pH: 6 (ref 5.0–8.0)

## 2019-12-01 LAB — VITAMIN B12: Vitamin B-12: 157 pg/mL — ABNORMAL LOW (ref 211–911)

## 2019-12-01 LAB — TSH: TSH: 1.13 u[IU]/mL (ref 0.35–4.50)

## 2019-12-01 NOTE — Progress Notes (Signed)
Subjective:    Patient ID: [name], female    DOB: 11-Dec-1924, 86 y.o.   MRN: [id number]  HPI 86 year old female who  has a past medical history of Anxiety, Aortic arch atherosclerosis ([place]) (06/24/2014), Atherosclerotic ulcer of aorta ([place]) (06/24/2014), Carotid artery occlusion, DISC DISEASE, LUMBAR (12/16/2008), DIVERTICULOSIS, COLON (09/30/2008), DYSPNEA (07/13/2008), Graves disease, History of embolic stroke ([id number]), HYPERLIPIDEMIA (03/06/2007), HYPERTENSION (03/06/2007), HYPOTHYROIDISM (10/13/2007), OSTEOARTHRITIS (03/06/2007), Personal history of colonic polyps (09/30/2008), Stroke ([place]), TOBACCO USE, QUIT (04/12/2009), and WEAKNESS (11/09/2007).  She presents with her daughter for acute  Generalized fatigue and decreased appetite that started about 1 week ago.  She has history of anemia in the past and symptoms are similar to what she has experienced when she was anemic.  She has started over-the-counter iron supplementation and has taken 2 doses.  She denies fevers, chills, nausea, vomiting, diarrhea, or depression.  She is eating but has to force herself to eat.  She does report drinking water throughout the day but her daughter states that she will have water on her table next to her but will only sip on it throughout the day  Wt Readings from Last 3 Encounters:  12/01/19 138 lb (62.6 kg)  09/24/19 142 lb (64.4 kg)  09/14/19 140 lb 4.8 oz (63.6 kg)    Review of Systems See HPI   Past Medical History:  Diagnosis Date  . Anxiety   . Aortic arch atherosclerosis ([place]) 06/24/2014  . Atherosclerotic ulcer of aorta ([place]) 06/24/2014  . Carotid artery occlusion   . [place] DISEASE, LUMBAR 12/16/2008  . DIVERTICULOSIS, COLON 09/30/2008  . DYSPNEA 07/13/2008  . Graves disease   . History of embolic stroke [id number]   Left brain  . HYPERLIPIDEMIA 03/06/2007  . HYPERTENSION 03/06/2007  . HYPOTHYROIDISM 10/13/2007  . OSTEOARTHRITIS 03/06/2007  . Personal history of colonic polyps 09/30/2008  .  Stroke ([place])    06/2014           . TOBACCO USE, QUIT 04/12/2009  . WEAKNESS 11/09/2007    Social History   Socioeconomic History  . Marital status: Divorced    Spouse name: Not on file  . Number of children: 4  . Years of education: college  . Highest education level: Not on file  Occupational History  . Occupation: retired  Tobacco Use  . Smoking status: Former Smoker    Packs/day: 1.50    Years: 29.00    Pack years: 43.50    Types: Cigarettes    Start date: 07/08/1949    Quit date: 07/08/1978    Years since quitting: 41.4  . Smokeless tobacco: Never Used  Substance and Sexual Activity  . Alcohol use: Yes    Alcohol/week: 1.0 standard drinks    Types: 1 Glasses of wine per week    Comment: "red wine"- some nights  . Drug use: No  . Sexual activity: Not on file  Other Topics Concern  . Not on file  Social History Narrative      Patient is right handed.   Patient drinks 1 cup caffeine daily.   Social Determinants of Health   Financial Resource Strain:   . Difficulty of Paying Living Expenses:   Food Insecurity:   . Worried About Charity fundraiser in the Last Year:   . Arboriculturist in the Last Year:   Transportation Needs:   . Film/video editor (Medical):   [name] Kitchen Lack of Transportation (Non-Medical):   Physical Activity:   .  Days of Exercise per Week:   . Minutes of Exercise per Session:   Stress:   . Feeling of Stress :   Social Connections:   . Frequency of Communication with Friends and Family:   . Frequency of Social Gatherings with Friends and Family:   . Attends Religious Services:   . Active Member of Clubs or Organizations:   . Attends Archivist Meetings:   [name] Kitchen Marital Status:   Intimate Partner Violence:   . Fear of Current or Ex-Partner:   . Emotionally Abused:   [name] Kitchen Physically Abused:   . Sexually Abused:     Past Surgical History:  Procedure Laterality Date  . CARDIAC CATHETERIZATION    . CATARACT EXTRACTION Bilateral   .  CHOLECYSTECTOMY    . ENDARTERECTOMY Left 11/13/2015   Procedure: LEFT CAROTID ARTERY ENDARTERECTOMY;  Surgeon: Conrad Onaka, MD;  Location:  [facility];  Service: Vascular;  Laterality: Left;  . ESOPHAGOGASTRODUODENOSCOPY (EGD) WITH PROPOFOL N/A 10/11/2016   Procedure: ESOPHAGOGASTRODUODENOSCOPY (EGD) WITH PROPOFOL;  Surgeon: Mauri Pole, MD;  Location: [facility];  Service: Endoscopy;  Laterality: N/A;  . KNEE SURGERY    . PATCH ANGIOPLASTY Left 11/13/2015   Procedure: WITH 1CM X 6CM  XENOSURE BIOLOGIC PATCH ANGIOPLASTY;  Surgeon: Conrad Hialeah, MD;  Location: [facility];  Service: Vascular;  Laterality: Left;  . TEE WITHOUT CARDIOVERSION N/A 06/24/2014   Procedure: TRANSESOPHAGEAL ECHOCARDIOGRAM (TEE);  Surgeon: Sanda Klein, MD;  Location: [facility] ENDOSCOPY;  Service: Cardiovascular;  Laterality: N/A;    Family History  Problem Relation Age of Onset  . Stomach cancer Maternal Grandmother   . Colon cancer Neg Hx     Allergies  Allergen Reactions  . Catapres [Clonidine Hcl] Other (See Comments)    Made pt feel horrible, shaky, weak and nausea  . Lisinopril Anaphylaxis    Tongue swelling  . Labetalol Hcl Other (See Comments)    Caused bradycardia and syncope    Current Outpatient Medications on File Prior to Visit  Medication Sig Dispense Refill  . albuterol (PROAIR HFA) 108 (90 Base) MCG/ACT inhaler Inhale 2 puffs into the lungs every 6 (six) hours as needed for wheezing or shortness of breath. 8.5 Inhaler 5  . amLODipine (NORVASC) 10 MG tablet Take 0.5 tablets (5 mg total) by mouth daily. 90 tablet 3  . aspirin EC 81 MG tablet Take one tablet by mouth 5 days per week 90 tablet 3  . atorvastatin (LIPITOR) 80 MG tablet TAKE 1 TABLET BY MOUTH EVERY DAY AT 6PM 90 tablet 1  . Fluticasone-Umeclidin-Vilant (TRELEGY ELLIPTA) 100-62.5-25 MCG/INH AEPB Inhale 1 puff into the lungs daily. 60 each 5  . hydrALAZINE (APRESOLINE) 25 MG tablet TAKE 3 TABLETS BY MOUTH 3 TIMES A DAY 270 tablet 11  .  hydrochlorothiazide (HYDRODIURIL) 25 MG tablet Take 1 tablet (25 mg total) by mouth every other day. 45 tablet 1  . ipratropium-albuterol (DUONEB) 0.5-2.5 (3) MG/3ML SOLN Take 3 mLs by nebulization every 6 (six) hours as needed. 360 mL 3  . latanoprost (XALATAN) 0.005 % ophthalmic solution Place 1 drop into both eyes at bedtime.     [name] Kitchen levothyroxine (SYNTHROID) 75 MCG tablet TAKE 1 TABLET BY MOUTH EVERY DAY BEFORE BREAKFAST 90 tablet 3  . predniSONE (DELTASONE) 10 MG tablet Take 0.5 tablets (5 mg total) by mouth daily with breakfast. 30 tablet 3  . Spacer/Aero-Holding Chambers (AEROCHAMBER MV) inhaler Use as instructed 1 each 0   No current facility-administered medications on file prior  to visit.    BP (!) 156/76   Pulse 82   Temp 98 F (36.7 C)   Wt 138 lb (62.6 kg)   SpO2 96%   BMI 22.27 kg/m       Objective:   Physical Exam Vitals and nursing note reviewed.  Constitutional:      Appearance: Normal appearance.  Cardiovascular:     Rate and Rhythm: Normal rate and regular rhythm.     Pulses: Normal pulses.     Heart sounds: Normal heart sounds.  Pulmonary:     Effort: Pulmonary effort is normal.     Breath sounds: Normal breath sounds.  Abdominal:     General: Abdomen is flat. Bowel sounds are normal.     Palpations: Abdomen is soft.  Skin:    General: Skin is warm and dry.  Neurological:     General: No focal deficit present.     Mental Status: She is alert and oriented to person, place, and time.  Psychiatric:        Mood and Affect: Mood normal.        Behavior: Behavior normal.        Thought Content: Thought content normal.        Judgment: Judgment normal.       Assessment & Plan:  1. Fatigue, unspecified type - Possible anemia but will check TSH and Vitamin B 12 as well.  - Increase water intake  - Basic Metabolic Panel - CBC with Differential/Platelet - TSH - Vitamin B12 - IBC + Ferritin - Urinalysis  2. Decreased appetite - Basic Metabolic  Panel - CBC with Differential/Platelet - TSH - Vitamin B12 - IBC + Ferritin  [name], NP

## 2019-12-01 NOTE — Patient Instructions (Signed)
It was great seeing you today   We will follow up with you regarding your blood work    

## 2019-12-02 ENCOUNTER — Telehealth: Payer: Self-pay | Admitting: Adult Health

## 2019-12-02 NOTE — Telephone Encounter (Signed)
Thyroid normal.  No anemia.  Did have low B12 (157).  Recommend B12 1,000 mcg IM once weekly for 4 weeks and make sure she has follow up with Tommi Rumps within about one month to reassess.   He can decide at that time whether to continue IM vs oral.

## 2019-12-02 NOTE — Telephone Encounter (Signed)
Do you mind reviewing and interpreting labs  for patient since PCP is out until Tuesday please?   LVM for [name] to let her know I will forward to another provider to review and interpret.

## 2019-12-02 NOTE — Telephone Encounter (Signed)
[name] is the patients daughter, she is calling to have someone go over the lab results with her that are in [place].   Please advise

## 2019-12-02 NOTE — Telephone Encounter (Signed)
Jose Persia (OK per [facility]) was returning Kendra's call. [name] is aware that Tommi Rumps is gone until next Tuesday and was wondering if another PCP can review the pt's results and get back with her?   [name] can be reached at [phone]

## 2019-12-02 NOTE — Telephone Encounter (Signed)
Called Elita Quick back to advise that labs have not been resulted but no answer or vm was set up.

## 2019-12-03 ENCOUNTER — Other Ambulatory Visit: Payer: Self-pay | Admitting: Nurse Practitioner

## 2019-12-03 NOTE — Telephone Encounter (Signed)
Clinic RN spoke with [name]. Informed her per Dr. Elease Hashimoto the patient's Thyroid normal.  No anemia.  Did have low B12 (157).  Recommend B12 1,000 mcg IM once weekly for 4 weeks and make sure she has follow up with Tommi Rumps within about one month to reassess.   He can decide at that time whether to continue IM vs oral. [name] verbalized understanding. B-12 injections scheduled for every week in June.  Do you want a lab only appt for b-12 injection follow up?

## 2019-12-07 ENCOUNTER — Other Ambulatory Visit: Payer: Self-pay

## 2019-12-07 ENCOUNTER — Telehealth: Payer: Self-pay | Admitting: Adult Health

## 2019-12-07 ENCOUNTER — Ambulatory Visit (INDEPENDENT_AMBULATORY_CARE_PROVIDER_SITE_OTHER): Payer: Medicare PPO | Admitting: *Deleted

## 2019-12-07 DIAGNOSIS — E538 Deficiency of other specified B group vitamins: Secondary | ICD-10-CM

## 2019-12-07 DIAGNOSIS — H353222 Exudative age-related macular degeneration, left eye, with inactive choroidal neovascularization: Secondary | ICD-10-CM | POA: Diagnosis not present

## 2019-12-07 DIAGNOSIS — H401131 Primary open-angle glaucoma, bilateral, mild stage: Secondary | ICD-10-CM | POA: Diagnosis not present

## 2019-12-07 MED ORDER — CYANOCOBALAMIN 1000 MCG/ML IJ SOLN
1000.0000 ug | Freq: Once | INTRAMUSCULAR | Status: AC
  Administered 2019-12-07: 1000 ug via INTRAMUSCULAR

## 2019-12-07 NOTE — Progress Notes (Signed)
Per orders of Dr. Carlisle Cater, injection of 1st B 12 given by Zacarias Pontes. Patient tolerated injection well.

## 2019-12-07 NOTE — Telephone Encounter (Signed)
We can do a lab visit and virtual follow up

## 2019-12-07 NOTE — Telephone Encounter (Signed)
Left a message for pt to call and schedule appt per [facility].

## 2019-12-13 ENCOUNTER — Other Ambulatory Visit: Payer: Self-pay

## 2019-12-14 ENCOUNTER — Ambulatory Visit (INDEPENDENT_AMBULATORY_CARE_PROVIDER_SITE_OTHER): Payer: Medicare PPO | Admitting: Family Medicine

## 2019-12-14 DIAGNOSIS — H353221 Exudative age-related macular degeneration, left eye, with active choroidal neovascularization: Secondary | ICD-10-CM | POA: Diagnosis not present

## 2019-12-14 DIAGNOSIS — H35371 Puckering of macula, right eye: Secondary | ICD-10-CM | POA: Diagnosis not present

## 2019-12-14 DIAGNOSIS — H35033 Hypertensive retinopathy, bilateral: Secondary | ICD-10-CM | POA: Diagnosis not present

## 2019-12-14 DIAGNOSIS — E538 Deficiency of other specified B group vitamins: Secondary | ICD-10-CM

## 2019-12-14 DIAGNOSIS — H353113 Nonexudative age-related macular degeneration, right eye, advanced atrophic without subfoveal involvement: Secondary | ICD-10-CM | POA: Diagnosis not present

## 2019-12-14 MED ORDER — CYANOCOBALAMIN 1000 MCG/ML IJ SOLN
1000.0000 ug | Freq: Once | INTRAMUSCULAR | Status: AC
  Administered 2019-12-14: 1000 ug via INTRAMUSCULAR

## 2019-12-14 NOTE — Progress Notes (Signed)
Per orders of CORY NAFZIGER, an injection of B12 given by Miles Costain, RMA.  Patient tolerated injection well.

## 2019-12-20 ENCOUNTER — Other Ambulatory Visit: Payer: Self-pay

## 2019-12-21 ENCOUNTER — Ambulatory Visit (INDEPENDENT_AMBULATORY_CARE_PROVIDER_SITE_OTHER): Payer: Medicare PPO

## 2019-12-21 DIAGNOSIS — E538 Deficiency of other specified B group vitamins: Secondary | ICD-10-CM | POA: Diagnosis not present

## 2019-12-21 MED ORDER — CYANOCOBALAMIN 1000 MCG/ML IJ SOLN
1000.0000 ug | Freq: Once | INTRAMUSCULAR | Status: AC
  Administered 2019-12-21: 1000 ug via INTRAMUSCULAR

## 2019-12-21 NOTE — Patient Instructions (Signed)
Health Maintenance Due  Topic Date Due  . DEXA SCAN  Never done  . TETANUS/TDAP  12/13/2018  . COVID-19 Vaccine (2 - Moderna 2-dose series) 09/03/2019    No flowsheet data found.

## 2019-12-21 NOTE — Progress Notes (Signed)
Per orders of [name] NP , injection of B12 given in Left deltoid by Franco Collet. Patient tolerated injection well.

## 2019-12-28 ENCOUNTER — Other Ambulatory Visit: Payer: Self-pay

## 2019-12-28 ENCOUNTER — Ambulatory Visit (INDEPENDENT_AMBULATORY_CARE_PROVIDER_SITE_OTHER): Payer: Medicare PPO | Admitting: Family Medicine

## 2019-12-28 ENCOUNTER — Other Ambulatory Visit: Payer: Self-pay | Admitting: Pulmonary Disease

## 2019-12-28 DIAGNOSIS — H353221 Exudative age-related macular degeneration, left eye, with active choroidal neovascularization: Secondary | ICD-10-CM | POA: Diagnosis not present

## 2019-12-28 DIAGNOSIS — E538 Deficiency of other specified B group vitamins: Secondary | ICD-10-CM | POA: Diagnosis not present

## 2019-12-28 MED ORDER — CYANOCOBALAMIN 1000 MCG/ML IJ SOLN
1000.0000 ug | Freq: Once | INTRAMUSCULAR | Status: AC
  Administered 2019-12-28: 1000 ug via INTRAMUSCULAR

## 2019-12-28 NOTE — Progress Notes (Signed)
Per orders of CORY NAFZIGER, injection of B12 given by Miles Costain. Patient tolerated injection well.

## 2020-01-04 ENCOUNTER — Encounter: Payer: Self-pay | Admitting: Adult Health

## 2020-01-04 ENCOUNTER — Telehealth (INDEPENDENT_AMBULATORY_CARE_PROVIDER_SITE_OTHER): Payer: Medicare PPO | Admitting: Adult Health

## 2020-01-04 ENCOUNTER — Ambulatory Visit: Payer: Self-pay

## 2020-01-04 ENCOUNTER — Other Ambulatory Visit: Payer: Self-pay

## 2020-01-04 VITALS — Wt 135.0 lb

## 2020-01-04 DIAGNOSIS — E538 Deficiency of other specified B group vitamins: Secondary | ICD-10-CM

## 2020-01-04 NOTE — Progress Notes (Signed)
Virtual Visit via Telephone Note  I connected with [name] on 01/04/20 at  4:30 PM EDT by telephone and verified that I am speaking with the correct person using two identifiers.   I discussed the limitations, risks, security and privacy concerns of performing an evaluation and management service by telephone and the availability of in person appointments. I also discussed with the patient that there may be a patient responsible charge related to this service. The patient expressed understanding and agreed to proceed.  Location patient: home Location provider: work or home office Participants present for the call: patient, provider Patient did not have a visit in the prior 7 days to address this/these issue(s).   History of Present Illness: 86 year old female who is being evaluated today for follow-up regarding vitamin B12 deficiency.  She was found to be B12 deficient about a month ago after she was seen for generalized fatigue and decreased appetite that started 1 week prior.  B12 level at this time was 157.  She has received weekly B12 injections since then reports that she feels better, has an increased appetite and some decreased fatigue.   Observations/Objective: Patient sounds cheerful and well on the phone. I do not appreciate any SOB. Speech and thought processing are grossly intact. Patient reported vitals:  Assessment and Plan: 1. Vitamin B 12 deficiency -We will likely have her continue with B12 injections on a monthly basis.  We will recheck her B12 level as well. - Vitamin B12; Future   Follow Up Instructions:  I did not refer this patient for an OV in the next 24 hours for this/these issue(s).  I discussed the assessment and treatment plan with the patient. The patient was provided an opportunity to ask questions and all were answered. The patient agreed with the plan and demonstrated an understanding of the instructions.   The patient was advised to call back or  seek an in-person evaluation if the symptoms worsen or if the condition fails to improve as anticipated.  I provided 13 minutes of non-face-to-face time during this encounter.   [name], NP

## 2020-01-06 ENCOUNTER — Other Ambulatory Visit: Payer: Self-pay

## 2020-01-06 ENCOUNTER — Other Ambulatory Visit: Payer: Self-pay | Admitting: Adult Health

## 2020-01-06 ENCOUNTER — Other Ambulatory Visit (INDEPENDENT_AMBULATORY_CARE_PROVIDER_SITE_OTHER): Payer: Medicare PPO

## 2020-01-06 DIAGNOSIS — E538 Deficiency of other specified B group vitamins: Secondary | ICD-10-CM

## 2020-01-06 LAB — VITAMIN B12: Vitamin B-12: 722 pg/mL (ref 211–911)

## 2020-01-11 ENCOUNTER — Other Ambulatory Visit: Payer: Self-pay

## 2020-01-11 ENCOUNTER — Other Ambulatory Visit: Payer: Self-pay | Admitting: Adult Health

## 2020-01-11 ENCOUNTER — Telehealth: Payer: Self-pay | Admitting: Adult Health

## 2020-01-11 ENCOUNTER — Telehealth (INDEPENDENT_AMBULATORY_CARE_PROVIDER_SITE_OTHER): Payer: Medicare PPO | Admitting: Adult Health

## 2020-01-11 DIAGNOSIS — G8929 Other chronic pain: Secondary | ICD-10-CM | POA: Diagnosis not present

## 2020-01-11 DIAGNOSIS — I872 Venous insufficiency (chronic) (peripheral): Secondary | ICD-10-CM

## 2020-01-11 DIAGNOSIS — M79604 Pain in right leg: Secondary | ICD-10-CM

## 2020-01-11 DIAGNOSIS — M5442 Lumbago with sciatica, left side: Secondary | ICD-10-CM | POA: Diagnosis not present

## 2020-01-11 MED ORDER — HYDROCHLOROTHIAZIDE 25 MG PO TABS: 25.0000 mg | ORAL_TABLET | ORAL | 1 refills | Status: DC

## 2020-01-11 MED ORDER — LIDOCAINE 4 % EX PTCH: 1.0000 | MEDICATED_PATCH | Freq: Two times a day (BID) | CUTANEOUS | 3 refills | Status: DC | PRN

## 2020-01-11 NOTE — Telephone Encounter (Signed)
The patient's daughter called to see if Tommi Rumps can call the patient in medication for her sciatica flare up in her left hip area.  She needs refills on these medications:  Flexeril - I didn't see in history  Lidocaine 4 % PTCH   hydrochlorothiazide (HYDRODIURIL) 25 MG tablet     [id number] [place] [place] - [address] AT [address] Phone:  [phone]  Fax:  [phone]     I have the patient scheduled for this afternoon at 5 PM virtual in case you are not to call in the Rx for the patient.  Thank you

## 2020-01-11 NOTE — Progress Notes (Signed)
Virtual Visit via Telephone Note  I connected with [name] on 01/11/20 at  5:00 PM EDT by telephone and verified that I am speaking with the correct person using two identifiers.   I discussed the limitations, risks, security and privacy concerns of performing an evaluation and management service by telephone and the availability of in person appointments. I also discussed with the patient that there may be a patient responsible charge related to this service. The patient expressed understanding and agreed to proceed.  Location patient: home Location provider: work or home office Participants present for the call: patient, provider, daughter Patient did not have a visit in the prior 7 days to address this/these issue(s).   History of Present Illness: 86 year old female who is being evaluated today for left-sided sciatica.  She has a history of this.  Most recent flare started 3 days ago.  He reports pain from her lower back that radiates down the outside of her left leg.  Pain is worse with walking and changing positions.  She has been having to use a cane to provide extra support.  She has not been taking anything over-the-counter except Tylenol.  She does have Robaxin that was prescribed to her previously she was not sure if she should take this medication.  She is already taking prednisone for chronic COPD   Observations/Objective: Patient sounds cheerful and well on the phone. I do not appreciate any SOB. Speech and thought processing are grossly intact. Patient reported vitals:  Assessment and Plan: 1. Chronic left-sided low back pain with left-sided sciatica -She can take Robaxin this evening to see if she gets any relief.  We will follow-up with her tomorrow afternoon to see how she is doing.  We may need to switch her muscle relaxer.  She can continue with Tylenol as needed as needed.   Follow Up Instructions:  I did not refer this patient for an OV in the next 24 hours for  this/these issue(s).  I discussed the assessment and treatment plan with the patient. The patient was provided an opportunity to ask questions and all were answered. The patient agreed with the plan and demonstrated an understanding of the instructions.   The patient was advised to call back or seek an in-person evaluation if the symptoms worsen or if the condition fails to improve as anticipated.  I provided 10 minutes of non-face-to-face time during this encounter.   [name], NP

## 2020-01-12 ENCOUNTER — Telehealth: Payer: Self-pay | Admitting: Adult Health

## 2020-01-12 ENCOUNTER — Other Ambulatory Visit: Payer: Self-pay | Admitting: Pulmonary Disease

## 2020-01-12 MED ORDER — ACETAMINOPHEN-CODEINE 300-30 MG PO TABS: 1.0000 | ORAL_TABLET | Freq: Three times a day (TID) | ORAL | 0 refills | Status: DC | PRN

## 2020-01-12 MED ORDER — METHOCARBAMOL 500 MG PO TABS: 500.0000 mg | ORAL_TABLET | Freq: Three times a day (TID) | ORAL | 0 refills | Status: AC

## 2020-01-12 NOTE — Telephone Encounter (Signed)
Spoke to patient to see how she did last time with taking Robaxin for sciatica pain.  She does report that she did notice a little bit of relief but will need a refill of this medication.  She also took Tylenol with codeine that she had previously and did notice some additional relief with this and was wondering if she could have a refill of that medication as well

## 2020-01-12 NOTE — Telephone Encounter (Signed)
Error

## 2020-01-14 ENCOUNTER — Emergency Department (HOSPITAL_COMMUNITY): Payer: Medicare PPO

## 2020-01-14 ENCOUNTER — Encounter: Payer: Self-pay | Admitting: Adult Health

## 2020-01-14 ENCOUNTER — Telehealth (INDEPENDENT_AMBULATORY_CARE_PROVIDER_SITE_OTHER): Payer: Medicare PPO | Admitting: Adult Health

## 2020-01-14 ENCOUNTER — Other Ambulatory Visit: Payer: Self-pay

## 2020-01-14 ENCOUNTER — Encounter (HOSPITAL_COMMUNITY): Payer: Self-pay

## 2020-01-14 ENCOUNTER — Inpatient Hospital Stay (HOSPITAL_COMMUNITY)
Admission: EM | Admit: 2020-01-14 | Discharge: 2020-01-16 | DRG: 190 | Disposition: A | Discharge status: Home / Self Care | Payer: Medicare PPO | Attending: Family Medicine | Admitting: Family Medicine

## 2020-01-14 DIAGNOSIS — N1831 Chronic kidney disease, stage 3a: Secondary | ICD-10-CM | POA: Diagnosis present

## 2020-01-14 DIAGNOSIS — Z79890 Hormone replacement therapy (postmenopausal): Secondary | ICD-10-CM

## 2020-01-14 DIAGNOSIS — F419 Anxiety disorder, unspecified: Secondary | ICD-10-CM | POA: Diagnosis present

## 2020-01-14 DIAGNOSIS — E039 Hypothyroidism, unspecified: Secondary | ICD-10-CM | POA: Diagnosis present

## 2020-01-14 DIAGNOSIS — Z8673 Personal history of transient ischemic attack (TIA), and cerebral infarction without residual deficits: Secondary | ICD-10-CM

## 2020-01-14 DIAGNOSIS — Z888 Allergy status to other drugs, medicaments and biological substances status: Secondary | ICD-10-CM | POA: Diagnosis not present

## 2020-01-14 DIAGNOSIS — Z800 Family history of malignant neoplasm of digestive organs: Secondary | ICD-10-CM | POA: Diagnosis not present

## 2020-01-14 DIAGNOSIS — Z7952 Long term (current) use of systemic steroids: Secondary | ICD-10-CM | POA: Diagnosis not present

## 2020-01-14 DIAGNOSIS — Z20822 Contact with and (suspected) exposure to covid-19: Secondary | ICD-10-CM | POA: Diagnosis present

## 2020-01-14 DIAGNOSIS — J9601 Acute respiratory failure with hypoxia: Secondary | ICD-10-CM | POA: Diagnosis present

## 2020-01-14 DIAGNOSIS — I129 Hypertensive chronic kidney disease with stage 1 through stage 4 chronic kidney disease, or unspecified chronic kidney disease: Secondary | ICD-10-CM | POA: Diagnosis present

## 2020-01-14 DIAGNOSIS — E785 Hyperlipidemia, unspecified: Secondary | ICD-10-CM | POA: Diagnosis present

## 2020-01-14 DIAGNOSIS — Z79899 Other long term (current) drug therapy: Secondary | ICD-10-CM

## 2020-01-14 DIAGNOSIS — N1832 Chronic kidney disease, stage 3b: Secondary | ICD-10-CM | POA: Diagnosis present

## 2020-01-14 DIAGNOSIS — G8929 Other chronic pain: Secondary | ICD-10-CM

## 2020-01-14 DIAGNOSIS — K5730 Diverticulosis of large intestine without perforation or abscess without bleeding: Secondary | ICD-10-CM | POA: Diagnosis present

## 2020-01-14 DIAGNOSIS — J441 Chronic obstructive pulmonary disease with (acute) exacerbation: Secondary | ICD-10-CM | POA: Diagnosis not present

## 2020-01-14 DIAGNOSIS — Z87891 Personal history of nicotine dependence: Secondary | ICD-10-CM

## 2020-01-14 DIAGNOSIS — M5442 Lumbago with sciatica, left side: Secondary | ICD-10-CM

## 2020-01-14 DIAGNOSIS — J90 Pleural effusion, not elsewhere classified: Secondary | ICD-10-CM | POA: Diagnosis not present

## 2020-01-14 DIAGNOSIS — R52 Pain, unspecified: Secondary | ICD-10-CM | POA: Diagnosis not present

## 2020-01-14 DIAGNOSIS — Z9049 Acquired absence of other specified parts of digestive tract: Secondary | ICD-10-CM | POA: Diagnosis not present

## 2020-01-14 DIAGNOSIS — J9811 Atelectasis: Secondary | ICD-10-CM | POA: Diagnosis present

## 2020-01-14 DIAGNOSIS — I700 Atherosclerosis of aorta: Secondary | ICD-10-CM | POA: Diagnosis present

## 2020-01-14 DIAGNOSIS — R0602 Shortness of breath: Secondary | ICD-10-CM | POA: Diagnosis not present

## 2020-01-14 DIAGNOSIS — Z7982 Long term (current) use of aspirin: Secondary | ICD-10-CM | POA: Diagnosis not present

## 2020-01-14 DIAGNOSIS — I499 Cardiac arrhythmia, unspecified: Secondary | ICD-10-CM | POA: Diagnosis not present

## 2020-01-14 DIAGNOSIS — I10 Essential (primary) hypertension: Secondary | ICD-10-CM | POA: Diagnosis present

## 2020-01-14 DIAGNOSIS — R7989 Other specified abnormal findings of blood chemistry: Secondary | ICD-10-CM

## 2020-01-14 DIAGNOSIS — M1990 Unspecified osteoarthritis, unspecified site: Secondary | ICD-10-CM | POA: Diagnosis present

## 2020-01-14 DIAGNOSIS — J449 Chronic obstructive pulmonary disease, unspecified: Secondary | ICD-10-CM

## 2020-01-14 DIAGNOSIS — E0500 Thyrotoxicosis with diffuse goiter without thyrotoxic crisis or storm: Secondary | ICD-10-CM | POA: Diagnosis present

## 2020-01-14 DIAGNOSIS — N183 Chronic kidney disease, stage 3 unspecified: Secondary | ICD-10-CM | POA: Diagnosis present

## 2020-01-14 DIAGNOSIS — Z8719 Personal history of other diseases of the digestive system: Secondary | ICD-10-CM

## 2020-01-14 DIAGNOSIS — R0689 Other abnormalities of breathing: Secondary | ICD-10-CM | POA: Diagnosis not present

## 2020-01-14 HISTORY — DX: Chronic obstructive pulmonary disease, unspecified: J44.9

## 2020-01-14 HISTORY — DX: Chronic kidney disease, unspecified: N18.9

## 2020-01-14 LAB — CBC WITH DIFFERENTIAL/PLATELET
Abs Immature Granulocytes: 0.03 10*3/uL (ref 0.00–0.07)
Basophils Absolute: 0 10*3/uL (ref 0.0–0.1)
Basophils Relative: 0 %
Eosinophils Absolute: 0 10*3/uL (ref 0.0–0.5)
Eosinophils Relative: 0 %
HCT: 42.4 % (ref 36.0–46.0)
Hemoglobin: 13.4 g/dL (ref 12.0–15.0)
Immature Granulocytes: 1 %
Lymphocytes Relative: 5 %
Lymphs Abs: 0.2 10*3/uL — ABNORMAL LOW (ref 0.7–4.0)
MCH: 29 pg (ref 26.0–34.0)
MCHC: 31.6 g/dL (ref 30.0–36.0)
MCV: 91.8 fL (ref 80.0–100.0)
Monocytes Absolute: 0.1 10*3/uL (ref 0.1–1.0)
Monocytes Relative: 3 %
Neutro Abs: 3.5 10*3/uL (ref 1.7–7.7)
Neutrophils Relative %: 91 %
Platelets: 175 10*3/uL (ref 150–400)
RBC: 4.62 MIL/uL (ref 3.87–5.11)
RDW: 14.6 % (ref 11.5–15.5)
WBC: 3.8 10*3/uL — ABNORMAL LOW (ref 4.0–10.5)
nRBC: 0 % (ref 0.0–0.2)

## 2020-01-14 LAB — BASIC METABOLIC PANEL
Anion gap: 10 (ref 5–15)
BUN: 21 mg/dL (ref 8–23)
CO2: 23 mmol/L (ref 22–32)
Calcium: 9 mg/dL (ref 8.9–10.3)
Chloride: 107 mmol/L (ref 98–111)
Creatinine, Ser: 1.25 mg/dL — ABNORMAL HIGH (ref 0.44–1.00)
GFR calc Af Amer: 43 mL/min — ABNORMAL LOW (ref >60)
GFR calc non Af Amer: 37 mL/min — ABNORMAL LOW (ref >60)
Glucose, Bld: 206 mg/dL — ABNORMAL HIGH (ref 70–99)
Potassium: 4.3 mmol/L (ref 3.5–5.1)
Sodium: 140 mmol/L (ref 135–145)

## 2020-01-14 LAB — SARS CORONAVIRUS 2 BY RT PCR (HOSPITAL ORDER, PERFORMED IN ~~LOC~~ HOSPITAL LAB): SARS Coronavirus 2: NEGATIVE

## 2020-01-14 LAB — BRAIN NATRIURETIC PEPTIDE: B Natriuretic Peptide: 196.6 pg/mL — ABNORMAL HIGH (ref 0.0–100.0)

## 2020-01-14 LAB — D-DIMER, QUANTITATIVE: D-Dimer, Quant: 1.89 ug/mL-FEU — ABNORMAL HIGH (ref 0.00–0.50)

## 2020-01-14 MED ORDER — ALBUTEROL SULFATE HFA 108 (90 BASE) MCG/ACT IN AERS
2.0000 | INHALATION_SPRAY | Freq: Once | RESPIRATORY_TRACT | Status: AC
  Administered 2020-01-14: 2 via RESPIRATORY_TRACT
  Filled 2020-01-14: qty 6.7

## 2020-01-14 MED ORDER — SODIUM CHLORIDE 0.9 % IV SOLN: INTRAVENOUS | Status: DC

## 2020-01-14 MED ORDER — PREDNISONE 10 MG PO TABS: ORAL_TABLET | ORAL | 0 refills | Status: DC

## 2020-01-14 MED ORDER — ACETAMINOPHEN 325 MG PO TABS
650.0000 mg | ORAL_TABLET | Freq: Four times a day (QID) | ORAL | Status: DC | PRN
  Administered 2020-01-15 (×2): 650 mg via ORAL
  Filled 2020-01-14 (×2): qty 2

## 2020-01-14 MED ORDER — ACETAMINOPHEN 650 MG RE SUPP: 650.0000 mg | Freq: Four times a day (QID) | RECTAL | Status: DC | PRN

## 2020-01-14 MED ORDER — HYDRALAZINE HCL 50 MG PO TABS
75.0000 mg | ORAL_TABLET | Freq: Three times a day (TID) | ORAL | Status: DC
  Administered 2020-01-15 (×4): 75 mg via ORAL
  Filled 2020-01-14 (×2): qty 1
  Filled 2020-01-14: qty 3
  Filled 2020-01-14: qty 1

## 2020-01-14 MED ORDER — FLUTICASONE-UMECLIDIN-VILANT 100-62.5-25 MCG/INH IN AEPB: 1.0000 | INHALATION_SPRAY | Freq: Every day | RESPIRATORY_TRACT | Status: DC

## 2020-01-14 MED ORDER — ATORVASTATIN CALCIUM 80 MG PO TABS
80.0000 mg | ORAL_TABLET | Freq: Every evening | ORAL | Status: DC
  Filled 2020-01-14: qty 1

## 2020-01-14 MED ORDER — AMLODIPINE BESYLATE 10 MG PO TABS
10.0000 mg | ORAL_TABLET | Freq: Every day | ORAL | Status: DC
  Administered 2020-01-15: 10 mg via ORAL
  Filled 2020-01-14: qty 1

## 2020-01-14 MED ORDER — IPRATROPIUM BROMIDE HFA 17 MCG/ACT IN AERS
2.0000 | INHALATION_SPRAY | Freq: Once | RESPIRATORY_TRACT | Status: AC
  Administered 2020-01-14: 2 via RESPIRATORY_TRACT
  Filled 2020-01-14: qty 12.9

## 2020-01-14 MED ORDER — AEROCHAMBER PLUS W/MASK MISC: 1.0000 | Status: DC

## 2020-01-14 MED ORDER — METHYLPREDNISOLONE SODIUM SUCC 125 MG IJ SOLR
125.0000 mg | Freq: Once | INTRAMUSCULAR | Status: AC
  Administered 2020-01-14: 125 mg via INTRAVENOUS
  Filled 2020-01-14: qty 2

## 2020-01-14 MED ORDER — LEVOTHYROXINE SODIUM 75 MCG PO TABS
75.0000 ug | ORAL_TABLET | Freq: Every day | ORAL | Status: DC
  Administered 2020-01-15 – 2020-01-16 (×2): 75 ug via ORAL
  Filled 2020-01-14 (×2): qty 1

## 2020-01-14 MED ORDER — ENOXAPARIN SODIUM 30 MG/0.3ML ~~LOC~~ SOLN
30.0000 mg | Freq: Every day | SUBCUTANEOUS | Status: DC
  Administered 2020-01-15 (×2): 30 mg via SUBCUTANEOUS
  Filled 2020-01-14 (×2): qty 0.3

## 2020-01-14 MED ORDER — DOXYCYCLINE HYCLATE 100 MG PO TABS
100.0000 mg | ORAL_TABLET | Freq: Two times a day (BID) | ORAL | Status: DC
  Administered 2020-01-15 (×3): 100 mg via ORAL
  Filled 2020-01-14 (×3): qty 1

## 2020-01-14 MED ORDER — IOHEXOL 350 MG/ML SOLN
75.0000 mL | Freq: Once | INTRAVENOUS | Status: AC | PRN
  Administered 2020-01-14: 75 mL via INTRAVENOUS

## 2020-01-14 MED ORDER — ALBUTEROL SULFATE HFA 108 (90 BASE) MCG/ACT IN AERS
2.0000 | INHALATION_SPRAY | Freq: Four times a day (QID) | RESPIRATORY_TRACT | Status: DC | PRN
  Filled 2020-01-14: qty 6.7

## 2020-01-14 MED ORDER — HYDROCHLOROTHIAZIDE 25 MG PO TABS: 25.0000 mg | ORAL_TABLET | ORAL | Status: DC | PRN

## 2020-01-14 MED ORDER — METHYLPREDNISOLONE SODIUM SUCC 125 MG IJ SOLR
80.0000 mg | Freq: Four times a day (QID) | INTRAMUSCULAR | Status: AC
  Administered 2020-01-15 (×3): 80 mg via INTRAVENOUS
  Filled 2020-01-14 (×3): qty 2

## 2020-01-14 MED ORDER — IPRATROPIUM-ALBUTEROL 0.5-2.5 (3) MG/3ML IN SOLN: 3.0000 mL | Freq: Four times a day (QID) | RESPIRATORY_TRACT | Status: DC | PRN

## 2020-01-14 MED ORDER — LORAZEPAM 2 MG/ML IJ SOLN
0.5000 mg | Freq: Once | INTRAMUSCULAR | Status: AC
  Administered 2020-01-14: 0.5 mg via INTRAVENOUS
  Filled 2020-01-14: qty 1

## 2020-01-14 NOTE — H&P (Addendum)
History and Physical    [name] ZOX:[id number] DOB: [birth year] DOA: 01/14/2020  PCP: [name], NP  Patient coming from: Home by EMS  Chief Complaint: Shortness of breath  HPI: [name] is a 86 y.o. female with medical history significant for COPD, hypertension, hyperlipidemia, atherosclerotic disease, chronic kidney disease stage III who presents for evaluation of shortness of breath that began this morning.  She reports that when she woke up she felt very short of breath nebulizer without significant improvement.  She states she had some mild chills but did not have any fever.  She denies any chest pain or palpitations.  She has not had any nausea, vomiting, diarrhea, abdominal pain, urinary symptoms.  She is taking her prednisone 10 mg a day which is prescribed for her COPD.  She states that her pulmonologist is want to take her off prednisone but she is unable to wean down past 10 mg without having exacerbations.  States she has not had any recent travel or prolonged immobilization.  She denies any known sick contacts.  She has had no known exposures to new chemicals, plants, animals, foods.  Patient does not use oxygen at home.  EMS was activated due to her shortness of breath and she was transferred to the hospital to being found to be hypoxic and hypertensive when EMS initially evaluated her.  ED Course: In the emergency room patient required oxygen by nasal cannula to maintain O2 sats greater than 92%.  Chest x-ray showed mild hazy opacities in bilateral bases that could be from edema or pneumonitis no infiltrate or consolidation noted.  CT angiography of chest showed no pulmonary embolism with bibasilar atelectasis but no infiltrate or consolidation.  She also has a small left pleural effusion.  Hospital service is asked to admit for further management for COPD exacerbation.  Review of Systems:  General: Reports having chills today.  Denies weakness, fever, weight loss, night  sweats.  Denies dizziness.  Denies change in appetite HENT: Denies head trauma, headache, denies change in hearing, tinnitus.  Denies nasal congestion or bleeding.  Denies sore throat, sores in mouth.  Denies difficulty swallowing Eyes: Denies blurry vision, pain in eye, drainage.  Denies discoloration of eyes. Neck: Denies pain.  Denies swelling.  Denies pain with movement. Cardiovascular: Denies chest pain, palpitations.  Denies edema.  Denies orthopnea Respiratory: Reports shortness of breath dry cough.  Reports wheezing.  Denies sputum production Gastrointestinal: Denies abdominal pain, swelling.  Denies nausea, vomiting, diarrhea.  Denies melena.  Denies hematemesis. Musculoskeletal: Denies limitation of movement.  Denies deformity or swelling.  Denies pain.  Denies arthralgias or myalgias. Genitourinary: Denies pelvic pain.  Denies urinary frequency or hesitancy.  Denies dysuria.  Skin: Denies rash.  Denies petechiae, purpura, ecchymosis. Neurological: Denies headache.  Denies syncope.  Denies seizure activity.  Denies weakness or paresthesia.  Slurred speech, drooping face.  Denies visual change. Psychiatric: Denies depression, anxiety.  Denies suicidal thoughts or ideation.  Denies hallucinations.  Past Medical History:  Diagnosis Date  . Anxiety   . Aortic arch atherosclerosis ([place]) 06/24/2014  . Atherosclerotic ulcer of aorta ([place]) 06/24/2014  . Carotid artery occlusion   . Chronic kidney disease   . COPD (chronic obstructive pulmonary disease) ([place])   . [place] DISEASE, LUMBAR 12/16/2008  . DIVERTICULOSIS, COLON 09/30/2008  . DYSPNEA 07/13/2008  . Graves disease   . History of embolic stroke 02/15/9146   Left brain  . HYPERLIPIDEMIA 03/06/2007  . HYPERTENSION 03/06/2007  . HYPOTHYROIDISM  10/13/2007  . OSTEOARTHRITIS 03/06/2007  . Personal history of colonic polyps 09/30/2008  . Stroke ([place])    06/2014           . TOBACCO USE, QUIT 04/12/2009  . WEAKNESS 11/09/2007    Past Surgical  History:  Procedure Laterality Date  . CARDIAC CATHETERIZATION    . CATARACT EXTRACTION Bilateral   . CHOLECYSTECTOMY    . ENDARTERECTOMY Left 11/13/2015   Procedure: LEFT CAROTID ARTERY ENDARTERECTOMY;  Surgeon: Conrad [place], MD;  Location: [place];  Service: Vascular;  Laterality: Left;  . ESOPHAGOGASTRODUODENOSCOPY (EGD) WITH PROPOFOL N/A 10/11/2016   Procedure: ESOPHAGOGASTRODUODENOSCOPY (EGD) WITH PROPOFOL;  Surgeon: Mauri Pole, MD;  Location: [facility];  Service: Endoscopy;  Laterality: N/A;  . KNEE SURGERY    . PATCH ANGIOPLASTY Left 11/13/2015   Procedure: WITH 1CM X 6CM  XENOSURE BIOLOGIC PATCH ANGIOPLASTY;  Surgeon: Conrad [place], MD;  Location: [facility];  Service: Vascular;  Laterality: Left;  . TEE WITHOUT CARDIOVERSION N/A 06/24/2014   Procedure: TRANSESOPHAGEAL ECHOCARDIOGRAM (TEE);  Surgeon: Sanda Klein, MD;  Location[facility] ENDOSCOPY;  Service: Cardiovascular;  Laterality: N/A;    Social History  reports that she quit smoking about 41 years ago. Her smoking use included cigarettes. She started smoking about 70 years ago. She has a 43.50 pack-year smoking history. She has never used smokeless tobacco. She reports current alcohol use of about 1.0 standard drink of alcohol per week. She reports that she does not use drugs.  Allergies  Allergen Reactions  . Catapres [Clonidine Hcl] Other (See Comments)    Made pt feel horrible, shaky, weak and nausea  . Lisinopril Anaphylaxis    Tongue swelling  . Labetalol Hcl Other (See Comments)    Caused bradycardia and syncope    Family History  Problem Relation Age of Onset  . Stomach cancer Maternal Grandmother   . Colon cancer Neg Hx      Prior to Admission medications   Medication Sig Start Date End Date Taking? Authorizing Provider  albuterol (PROAIR HFA) 108 (90 Base) MCG/ACT inhaler Inhale 2 puffs into the lungs every 6 (six) hours as needed for wheezing or shortness of breath. 11/13/17  Yes Providence Lanius A, PA-C   amLODipine (NORVASC) 10 MG tablet Take 0.5 tablets (5 mg total) by mouth daily. Patient taking differently: Take 10 mg by mouth daily.  12/07/19  Yes Burtis Junes, NP  atorvastatin (LIPITOR) 80 MG tablet TAKE 1 TABLET BY MOUTH EVERY DAY AT 6PM Patient taking differently: Take 80 mg by mouth every evening.  01/06/20  Yes Nafziger, Tommi Rumps, NP  hydrALAZINE (APRESOLINE) 25 MG tablet TAKE 3 TABLETS BY MOUTH 3 TIMES A DAY Patient taking differently: Take 75 mg by mouth 3 (three) times daily.  08/30/19  Yes Burtis Junes, NP  hydrochlorothiazide (HYDRODIURIL) 25 MG tablet Take 1 tablet (25 mg total) by mouth every other day. Patient taking differently: Take 25 mg by mouth as needed (fluid).  01/11/20  Yes Nafziger, Tommi Rumps, NP  ipratropium-albuterol (DUONEB) 0.5-2.5 (3) MG/3ML SOLN Take 3 mLs by nebulization every 6 (six) hours as needed (copd).   Yes [provider]  latanoprost (XALATAN) 0.005 % ophthalmic solution Place 1 drop into both eyes at bedtime.  04/02/19  Yes [provider]  levothyroxine (SYNTHROID) 75 MCG tablet TAKE 1 TABLET BY MOUTH EVERY DAY BEFORE BREAKFAST Patient taking differently: Take 75 mcg by mouth daily before breakfast.  07/16/19  Yes Nafziger, Tommi Rumps, NP  predniSONE (DELTASONE)  10 MG tablet TAKE 1/2 TABLET BY MOUTH DAILY WITH BREAKFAST Patient taking differently: Take 5 mg by mouth daily with breakfast.  01/12/20  Yes Mannam, Praveen, MD  Spacer/Aero-Holding Chambers (AEROCHAMBER MV) inhaler Use as instructed Patient taking differently: 1 each by Other route as directed.  11/26/17  Yes Magdalen Spatz, NP  TRELEGY ELLIPTA 100-62.5-25 MCG/INH AEPB TAKE 1 PUFF BY MOUTH EVERY DAY Patient taking differently: Inhale 1 puff into the lungs daily.  12/28/19  Yes Mannam, Hart Robinsons, MD  aspirin EC 81 MG tablet Take one tablet by mouth 5 days per week Patient not taking: Reported on 01/14/2020 01/25/19   Burtis Junes, NP  ipratropium-albuterol (DUONEB) 0.5-2.5 (3) MG/3ML SOLN Take  3 mLs by nebulization every 6 (six) hours as needed. Patient not taking: Reported on 01/14/2020 06/21/19   Marshell Garfinkel, MD  Lidocaine (HM LIDOCAINE PATCH) 4 % PTCH Apply 1 patch topically 2 (two) times daily as needed. Patient not taking: Reported on 01/14/2020 01/11/20   [name], NP  methocarbamol (ROBAXIN) 500 MG tablet Take 1 tablet (500 mg total) by mouth 3 (three) times daily for 7 days. Patient not taking: Reported on 01/14/2020 01/12/20 01/19/20  [name], NP  predniSONE (DELTASONE) 10 MG tablet 40 mg x 3 days, 20 mg x 3 days, 10 mg x 3 days 01/14/20   [name], NP    Physical Exam: Vitals:   01/14/20 1900 01/14/20 2030 01/14/20 2100 01/14/20 2145  BP: 139/64 (!) 178/72 (!) 184/69 (!) 189/87  Pulse: 63 72 75 79  Resp: 19 18 (!) 23 (!) 21  Temp:      TempSrc:      SpO2: 98% 97% 95% 96%  Weight:      Height:        Constitutional: NAD, calm, comfortable Vitals:   01/14/20 1900 01/14/20 2030 01/14/20 2100 01/14/20 2145  BP: 139/64 (!) 178/72 (!) 184/69 (!) 189/87  Pulse: 63 72 75 79  Resp: 19 18 (!) 23 (!) 21  Temp:      TempSrc:      SpO2: 98% 97% 95% 96%  Weight:      Height:       General: Pleasant elderly white female.  WDWN, Alert and oriented x3.  Eyes: EOMI, PERRL, lids and conjunctivae normal.  Sclera nonicteric HENT:  [place]/AT, external ears normal.  Nares patent without epistasis.  Mucous membranes are moist. Posterior pharynx clear of any exudate or lesions.  Dentures in place.  Neck: Soft, normal range of motion, supple, no masses, no thyromegaly.  Trachea midline Respiratory: Equal breath sounds.  Diffuse scattered Rales with mild expiratory wheezing. no crackles. Normal respiratory effort. No accessory muscle use.  Cardiovascular: Regular rate and rhythm, no murmurs / rubs / gallops. No extremity edema. 1+ pedal pulses.  Abdomen: Soft, no tenderness, nondistended, no rebound or guarding.  No masses palpated. No hepatosplenomegaly. Bowel sounds  normoactive Musculoskeletal: FROM. no clubbing / cyanosis. No joint deformity upper and lower extremities. no contractures. Normal muscle tone.  Skin: Warm, dry, intact no rashes, lesions, ulcers. No induration Neurologic: CN 2-12 grossly intact.  Normal speech.  Sensation intact, patella DTR +1 bilaterally. Strength 4/5 in all extremities.   Psychiatric: Normal judgment and insight.  Normal mood.    Labs on Admission: I have personally reviewed following labs and imaging studies  CBC: Recent Labs  Lab 01/14/20 1551  WBC 3.8*  NEUTROABS 3.5  HGB 13.4  HCT 42.4  MCV 91.8  PLT  638    Basic Metabolic Panel: Recent Labs  Lab 01/14/20 1551  NA 140  K 4.3  CL 107  CO2 23  GLUCOSE 206*  BUN 21  CREATININE 1.25*  CALCIUM 9.0    GFR: Estimated Creatinine Clearance: 26.9 mL/min (A) (by C-G formula based on SCr of 1.25 mg/dL (H)).  Liver Function Tests: No results for input(s): AST, ALT, ALKPHOS, BILITOT, PROT, ALBUMIN in the last 168 hours.  Urine analysis:    Component Value Date/Time   COLORURINE YELLOW 12/01/2019 1028   APPEARANCEUR [place] (A) 12/01/2019 1028   LABSPEC 1.015 12/01/2019 1028   PHURINE 6.0 12/01/2019 1028   GLUCOSEU NEGATIVE 12/01/2019 1028   HGBUR NEGATIVE 12/01/2019 1028   BILIRUBINUR NEGATIVE 12/01/2019 1028   BILIRUBINUR n 08/17/2012 [employer] 12/01/2019 1028   PROTEINUR NEGATIVE 09/02/2018 1622   UROBILINOGEN 0.2 12/01/2019 1028   NITRITE NEGATIVE 12/01/2019 1028   LEUKOCYTESUR SMALL (A) 12/01/2019 1028    Radiological Exams on Admission: CT Angio Chest PE W/Cm &/Or Wo Cm  Result Date: 01/14/2020 CLINICAL DATA:  Shortness of breath. EXAM: CT ANGIOGRAPHY CHEST WITH CONTRAST TECHNIQUE: Multidetector CT imaging of the chest was performed using the standard protocol during bolus administration of intravenous contrast. Multiplanar CT image reconstructions and MIPs were obtained to evaluate the vascular anatomy. CONTRAST:  14mL  OMNIPAQUE IOHEXOL 350 MG/ML SOLN COMPARISON:  April 05, 2016 FINDINGS: Cardiovascular: There is marked severity calcification and atherosclerosis of the thoracic aorta. Satisfactory opacification of the pulmonary arteries to the segmental level. No evidence of pulmonary embolism. There is mild cardiomegaly. No pericardial effusion. Moderate severity coronary artery calcification is seen. Mediastinum/Nodes: No enlarged mediastinal, hilar, or axillary lymph nodes. Thyroid gland, trachea, and esophagus demonstrate no significant findings. Lungs/Pleura: Mild atelectasis is seen within the posterior aspect of the bilateral lower lobes. There is a small left pleural effusion. No pneumothorax is identified. Upper Abdomen: Surgical clips are seen within the gallbladder fossa. Musculoskeletal: No chest wall abnormality. No acute or significant osseous findings. Review of the MIP images confirms the above findings. IMPRESSION: 1. No evidence of pulmonary embolism. 2. Mild bilateral lower lobe atelectasis. 3. Small left pleural effusion. 4. Mild cardiomegaly. 5. Moderate severity coronary artery calcification. 6. Aortic atherosclerosis. Aortic Atherosclerosis (ICD10-I70.0). Electronically Signed   By: Virgina Norfolk M.D.   On: 01/14/2020 20:12   DG Chest Port 1 View  Result Date: 01/14/2020 CLINICAL DATA:  Shortness of breath EXAM: PORTABLE CHEST 1 VIEW COMPARISON:  September 09, 2019 FINDINGS: The heart size and mediastinal contours are stable. There is a small left pleural effusion unchanged. Mild hazy opacities are identified in bilateral lung bases. The visualized skeletal structures are unremarkable. IMPRESSION: Small left pleural effusion unchanged. Mild hazy opacities identified in bilateral lung bases, nonspecific but can be seen in pneumonitis or edema. Electronically Signed   By: Abelardo Diesel M.D.   On: 01/14/2020 16:38    EKG: Independently reviewed.  EKGs reviewed.  Normal sinus rhythm.  Nonspecific ST  changes.  LVH by voltage criteria.  QTc prolonged at 492  Assessment/Plan Principal Problem:   COPD with acute exacerbation [facility]) Ms. Benningfield is admitted to medical floor. Patient be started on doxycycline twice daily for COPD exacerbation requiring hospitalization and new requirement for oxygen. Solu-Medrol 60 mg IV every 6 hours the next 24 hours.  Patient was given Solu-Medrol 105 mg in the emergency room Duo nebs every 6 hours.  Albuterol every 2-4 hours as needed for shortness  of breath, cough, wheeze. Continue home treology inhaler. Supplemental oxygen as needed to keep O2 sat between 92 to 96%. Incentive spirometer every hour while awake    Essential hypertension Home medications of Norvasc, hydralazine, hydrochlorothiazide.  Monitor blood pressure    DYSPNEA RT to follow.  Supple oxygen as needed.  Senna spirometer as above. Patient has had her COVID-19 vaccine. Check RSV to make sure this viral infection is not etiology of her COPD exacerbation    CKD (chronic kidney disease), stage III Stable kidney disease.    Current chronic use of systemic steroids Prednisone will be held while patient is on Solu-Medrol    D-dimer, elevated Patient with elevated D-dimer but no PE on CTA  of chest     Elevated BNP Check echocardiogram to evaluate wall motion and EF.     DVT prophylaxis: Paddles were elevated.  Lovenox for DVT prophylaxis Code Status:   Full code.  CODE STATUS discussed with patient and her daughter who is at bedside Family Communication:  Diagnosis and plan discussed with patient and her daughter.  Questions answered.  They both agree with plan.  Further recommendations to follow as clinical indicated  Disposition Plan:   Patient is from:  Home  Anticipated DC to:  Home  Anticipated DC date:  Anticipate greater than 2 midnight stay in the hospital  Anticipated DC barriers: No barriers to discharge identified at this time   Admission status:   Inpatient  Severity of Illness: The appropriate patient status for this patient is INPATIENT. Inpatient status is judged to be reasonable and necessary in order to provide the required intensity of service to ensure the patient's safety. The patient's presenting symptoms, physical exam findings, and initial radiographic and laboratory data in the context of their chronic comorbidities is felt to place them at high risk for further clinical deterioration. Furthermore, it is not anticipated that the patient will be medically stable for discharge from the hospital within 2 midnights of admission. The following factors support the patient status of inpatient.   " The patient's presenting symptoms include shortness of breath. " The worrisome physical exam findings include diffuse rales on auscultation of lungs.  Requiring oxygen supplementation. " The initial radiographic and laboratory data are worrisome because of elevated BNP and D-dimer. " The chronic co-morbidities include COPD   * I certify that at the point of admission it is my clinical judgment that the patient will require inpatient hospital care spanning beyond 2 midnights from the point of admission due to high intensity of service, high risk for further deterioration and high frequency of surveillance required.Yevonne Aline Khoi Hamberger MD [facility]  How to contact the [facility] Attending or Consulting provider [place] or covering provider during after hours [place], for this patient?   1. Check the care team in [facility] and look for a) attending/consulting [facility] provider listed and b) the [facility] team listed 2. Log into [email or web address] and use [place]'s universal password to access. If you do not have the password, please contact the hospital operator. 3. Locate the [employer] provider you are looking for under [facility] and page to a number that you can be directly reached. 4. If you still have difficulty reaching the provider, please  page the [facility] (Director on Call) for the Hospitalists listed on amion for assistance.  01/14/2020, 11:10 PM

## 2020-01-14 NOTE — Progress Notes (Signed)
Virtual Visit via Telephone Note  I connected with [name] on 01/14/20 at  2:30 PM EDT by telephone and verified that I am speaking with the correct person using two identifiers.   I discussed the limitations, risks, security and privacy concerns of performing an evaluation and management service by telephone and the availability of in person appointments. I also discussed with the patient that there may be a patient responsible charge related to this service. The patient expressed understanding and agreed to proceed.  Location patient: home Location provider: work or home office Participants present for the call: patient, provider Patient did not have a visit in the prior 7 days to address this/these issue(s).   History of Present Illness: 86 year old female who  has a past medical history of Anxiety, Aortic arch atherosclerosis ([place]) (06/24/2014), Atherosclerotic ulcer of aorta ([place]) (06/24/2014), Carotid artery occlusion, DISC DISEASE, LUMBAR (12/16/2008), DIVERTICULOSIS, COLON (09/30/2008), DYSPNEA (07/13/2008), Graves disease, History of embolic stroke (4/40/1027), HYPERLIPIDEMIA (03/06/2007), HYPERTENSION (03/06/2007), HYPOTHYROIDISM (10/13/2007), OSTEOARTHRITIS (03/06/2007), Personal history of colonic polyps (09/30/2008), Stroke ([place]), TOBACCO USE, QUIT (04/12/2009), and WEAKNESS (11/09/2007).  3 days ago she was evaluated via phone call for left-sided sciatica pain.  She does have a history of this and her most recent flare started 3 days prior.  The pain was from her lower back and radiated down the outside of her left leg.  Pain was worse with walking and changing positions.  In the past she she had Robaxin and Tylenol with codeine prescribed to her which worked well she was already taking prednisone for chronic COPD.   She was prescribed a short course of Tylenol 3 and was advised that she could take Robaxin that she had left over.After the first day of taking Robaxin her symptoms seem to be  improving slightly.  Since then the pain has not let up.  This morning when she woke up she took her Robaxin and later on in the morning she started shaking and "not feeling good".  She cannot describe to me what it means did not feel good, but she states "I just do not feel right, I am still having that pain and now I am shaking".  She did not have this reaction yesterday when she took her medications.   Observations/Objective: Patient sounds cheerful and well on the phone. I do not appreciate any SOB. Speech and thought processing are grossly intact. Patient reported vitals:  Assessment and Plan: 1. Chronic left-sided low back pain with left-sided sciatica -Unsure of her new symptoms of "not feeling well" and shaking her due to Robaxin and/or Tylenol 3.  I will have her continue with these medications he was advised not to take them tonight.  We will do a higher course of prednisone than what she is on now to see if we can get her some symptom relief.  She was advised to go to the emergency room over the weekend if symptoms become worse or do not resolve  - predniSONE (DELTASONE) 10 MG tablet; 40 mg x 3 days, 20 mg x 3 days, 10 mg x 3 days  Dispense: 21 tablet; Refill: 0   Follow Up Instructions:   I did not refer this patient for an OV in the next 24 hours for this/these issue(s).  I discussed the assessment and treatment plan with the patient. The patient was provided an opportunity to ask questions and all were answered. The patient agreed with the plan and demonstrated an understanding of the  instructions.   The patient was advised to call back or seek an in-person evaluation if the symptoms worsen or if the condition fails to improve as anticipated.  I provided 15 minutes of non-face-to-face time during this encounter.   [name], NP

## 2020-01-14 NOTE — ED Triage Notes (Signed)
Pt from home via ems; woke this am, gradual onset sob; hx copd, took normal meds as prescribed; neb treatment with minimal relief; sob progressively worsened, tachypaneic 30; lung sounds clear with ems, 94 to 95 % ra with ems; hr sinus arrhythmia; 70s to 115; denies hx cardiac issues; no hx htn, hypertensive with ems 209/95; sob did not increase with exertion; hx anxiety; started 2 new meds yesterday for sciatica; acetaminophen with codeine and methocarbamol  bp 209/95 cbg 101 Placed on 4 l for comfort Hr 70s to 115 RR 36

## 2020-01-14 NOTE — ED Provider Notes (Signed)
[facility] EMERGENCY DEPARTMENT Provider Note   CSN: [id number] Arrival date & time: 01/14/20  1529     History No chief complaint on file.   [name] is a 86 y.o. female.  86 year old female presents with shortness of breath times several hours.  Patient does have history of COPD and is on home nebulizer without improvement.  Has had slight cough no fever or chills.  No vomiting.  No chest pain or chest pressure.  Denies any leg pain or swelling.  Is currently being treated for sciatica.  She does take prednisone daily 10 mg.  She is not on home oxygen.  EMS called and patient found to be hypertensive and transported here        Past Medical History:  Diagnosis Date  . Anxiety   . Aortic arch atherosclerosis ([place]) 06/24/2014  . Atherosclerotic ulcer of aorta ([place]) 06/24/2014  . Carotid artery occlusion   . [place] DISEASE, LUMBAR 12/16/2008  . DIVERTICULOSIS, COLON 09/30/2008  . DYSPNEA 07/13/2008  . Graves disease   . History of embolic stroke 5/94/5859   Left brain  . HYPERLIPIDEMIA 03/06/2007  . HYPERTENSION 03/06/2007  . HYPOTHYROIDISM 10/13/2007  . OSTEOARTHRITIS 03/06/2007  . Personal history of colonic polyps 09/30/2008  . Stroke ([place])    06/2014           . TOBACCO USE, QUIT 04/12/2009  . WEAKNESS 11/09/2007    Patient Active Problem List   Diagnosis Date Noted  . Advice given about COVID-19 virus infection 07/21/2019  . Current chronic use of systemic steroids 07/13/2019  . Medication management 04/02/2018  . COPD exacerbation ([place]) 03/31/2018  . Hypokalemia 03/30/2018  . COPD with acute exacerbation ([place]) 03/02/2018  . SOB (shortness of breath) 01/12/2018  . CKD (chronic kidney disease), stage III 01/12/2018  . New onset left bundle branch block (LBBB) 01/12/2018  . COPD GOLD I D 01/12/2018  . Left buttock pain 09/13/2017  . Symptomatic anemia   . AVM (arteriovenous malformation) of duodenum, acquired with hemorrhage   . Acute GI bleeding  10/09/2016  . Other emphysema ([place]) 05/10/2016  . Recurrent laryngeal neuropathy 02/01/2016  . Nausea with vomiting 12/11/2015  . Left carotid stenosis 11/21/2015  . Asymptomatic carotid artery stenosis 11/13/2015  . Impaired glucose tolerance 10/27/2015  . Solitary pulmonary nodule 10/27/2015  . Thyroid nodule 10/27/2015  . Syncope 10/04/2015  . Bradycardia with less than 60 beats per minute 10/04/2015  . History of embolic stroke 29/24/4628  . Paresthesia of both feet 07/06/2014  . Aortic arch atherosclerosis ([place]) 06/24/2014  . Atherosclerotic ulcer of aorta ([facility]) 06/24/2014  . Stroke ([place]) 06/22/2014  . Bilateral carotid artery disease ([place]) 05/18/2014  . TOBACCO USE, QUIT 04/12/2009  . [place] DISEASE, LUMBAR 12/16/2008  . DIVERTICULOSIS, COLON 09/30/2008  . Personal history of colonic polyps 09/30/2008  . DYSPNEA 07/13/2008  . Weakness 11/09/2007  . Hypothyroidism 10/13/2007  . Hyperlipidemia 03/06/2007  . Essential hypertension 03/06/2007  . Osteoarthritis 03/06/2007    Past Surgical History:  Procedure Laterality Date  . CARDIAC CATHETERIZATION    . CATARACT EXTRACTION Bilateral   . CHOLECYSTECTOMY    . ENDARTERECTOMY Left 11/13/2015   Procedure: LEFT CAROTID ARTERY ENDARTERECTOMY;  Surgeon: Conrad Foot of Ten, MD;  Location: [facility];  Service: Vascular;  Laterality: Left;  . ESOPHAGOGASTRODUODENOSCOPY (EGD) WITH PROPOFOL N/A 10/11/2016   Procedure: ESOPHAGOGASTRODUODENOSCOPY (EGD) WITH PROPOFOL;  Surgeon: Mauri Pole, MD;  Location: [facility] ENDOSCOPY;  Service: Endoscopy;  Laterality: N/A;  .  KNEE SURGERY    . PATCH ANGIOPLASTY Left 11/13/2015   Procedure: WITH 1CM X 6CM  XENOSURE BIOLOGIC PATCH ANGIOPLASTY;  Surgeon: Conrad Hauula, MD;  Location: [facility];  Service: Vascular;  Laterality: Left;  . TEE WITHOUT CARDIOVERSION N/A 06/24/2014   Procedure: TRANSESOPHAGEAL ECHOCARDIOGRAM (TEE);  Surgeon: Sanda Klein, MD;  Location: [facility] ENDOSCOPY;  Service: Cardiovascular;  Laterality:  N/A;     OB History   No obstetric history on file.     Family History  Problem Relation Age of Onset  . Stomach cancer Maternal Grandmother   . Colon cancer Neg Hx     Social History   Tobacco Use  . Smoking status: Former Smoker    Packs/day: 1.50    Years: 29.00    Pack years: 43.50    Types: Cigarettes    Start date: 07/08/1949    Quit date: 07/08/1978    Years since quitting: 41.5  . Smokeless tobacco: Never Used  Vaping Use  . Vaping Use: Never used  Substance Use Topics  . Alcohol use: Yes    Alcohol/week: 1.0 standard drink    Types: 1 Glasses of wine per week    Comment: "red wine"- some nights  . Drug use: No    Home Medications Prior to Admission medications   Medication Sig Start Date End Date Taking? Authorizing Provider  albuterol (PROAIR HFA) 108 (90 Base) MCG/ACT inhaler Inhale 2 puffs into the lungs every 6 (six) hours as needed for wheezing or shortness of breath. 11/13/17   Volanda Napoleon, PA-C  amLODipine (NORVASC) 10 MG tablet Take 0.5 tablets (5 mg total) by mouth daily. 12/07/19   Burtis Junes, NP  aspirin EC 81 MG tablet Take one tablet by mouth 5 days per week 01/25/19   Burtis Junes, NP  atorvastatin (LIPITOR) 80 MG tablet TAKE 1 TABLET BY MOUTH EVERY DAY AT [facility] 01/06/20   Nafziger, Tommi Rumps, NP  hydrALAZINE (APRESOLINE) 25 MG tablet TAKE 3 TABLETS BY MOUTH 3 TIMES A DAY 08/30/19   Burtis Junes, NP  hydrochlorothiazide (HYDRODIURIL) 25 MG tablet Take 1 tablet (25 mg total) by mouth every other day. 01/11/20   Nafziger, Tommi Rumps, NP  ipratropium-albuterol (DUONEB) 0.5-2.5 (3) MG/3ML SOLN Take 3 mLs by nebulization every 6 (six) hours as needed. 06/21/19   Mannam, Hart Robinsons, MD  latanoprost (XALATAN) 0.005 % ophthalmic solution Place 1 drop into both eyes at bedtime.  04/02/19   [provider]  levothyroxine (SYNTHROID) 75 MCG tablet TAKE 1 TABLET BY MOUTH EVERY DAY BEFORE BREAKFAST 07/16/19   Nafziger, Tommi Rumps, NP  Lidocaine (HM LIDOCAINE PATCH) 4 %  PTCH Apply 1 patch topically 2 (two) times daily as needed. 01/11/20   Nafziger, Tommi Rumps, NP  methocarbamol (ROBAXIN) 500 MG tablet Take 1 tablet (500 mg total) by mouth 3 (three) times daily for 7 days. 01/12/20 01/19/20  Nafziger, Tommi Rumps, NP  predniSONE (DELTASONE) 10 MG tablet TAKE 1/2 TABLET BY MOUTH DAILY WITH BREAKFAST 01/12/20   Mannam, Praveen, MD  predniSONE (DELTASONE) 10 MG tablet 40 mg x 3 days, 20 mg x 3 days, 10 mg x 3 days 01/14/20   [name], NP  Spacer/Aero-Holding Chambers (AEROCHAMBER MV) inhaler Use as instructed 11/26/17   Magdalen Spatz, NP  TRELEGY ELLIPTA 100-62.5-25 MCG/INH AEPB TAKE 1 PUFF BY MOUTH EVERY DAY 12/28/19   Marshell Garfinkel, MD    Allergies    Catapres [clonidine hcl], Lisinopril, and Labetalol hcl  Review of Systems  Review of Systems  All other systems reviewed and are negative.   Physical Exam Updated Vital Signs BP (!) 196/80 (BP Location: Right Arm)   Pulse 84   Temp 98.1 F (36.7 C) (Oral)   Resp 20   SpO2 97%   Physical Exam Vitals and nursing note reviewed.  Constitutional:      General: She is not in acute distress.    Appearance: Normal appearance. She is well-developed. She is not toxic-appearing.  HENT:     Head: Normocephalic and atraumatic.  Eyes:     General: Lids are normal.     Conjunctiva/sclera: Conjunctivae normal.     Pupils: Pupils are equal, round, and reactive to light.  Neck:     Thyroid: No thyroid mass.     Trachea: No tracheal deviation.  Cardiovascular:     Rate and Rhythm: Normal rate and regular rhythm.     Heart sounds: Normal heart sounds. No murmur heard.  No gallop.   Pulmonary:     Effort: Pulmonary effort is normal. No respiratory distress.     Breath sounds: No stridor. Decreased breath sounds present. No wheezing, rhonchi or rales.  Abdominal:     General: Bowel sounds are normal. There is no distension.     Palpations: Abdomen is soft.     Tenderness: There is no abdominal tenderness. There is no  rebound.  Musculoskeletal:        General: No tenderness. Normal range of motion.     Cervical back: Normal range of motion and neck supple.  Skin:    General: Skin is warm and dry.     Findings: No abrasion or rash.  Neurological:     Mental Status: She is alert and oriented to person, place, and time.     GCS: GCS eye subscore is 4. GCS verbal subscore is 5. GCS motor subscore is 6.     Cranial Nerves: No cranial nerve deficit.     Sensory: No sensory deficit.  Psychiatric:        Mood and Affect: Mood is anxious.        Speech: Speech normal.        Behavior: Behavior normal.     ED Results / Procedures / Treatments   Labs (all labs ordered are listed, but only abnormal results are displayed) Labs Reviewed  CBC WITH DIFFERENTIAL/PLATELET  BASIC METABOLIC PANEL  D-DIMER, QUANTITATIVE (NOT AT [facility])  [place]    EKG None  Radiology No results found.  Procedures Procedures (including critical care time)  Medications Ordered in ED Medications  0.9 %  sodium chloride infusion (has no administration in time range)  albuterol (VENTOLIN HFA) 108 (90 Base) MCG/ACT inhaler 2 puff (has no administration in time range)  ipratropium (ATROVENT HFA) inhaler 2 puff (has no administration in time range)    ED Course  I have reviewed the triage vital signs and the nursing notes.  Pertinent labs & imaging results that were available during my care of the patient were reviewed by me and considered in my medical decision making (see chart for details).    MDM Rules/Calculators/A&P                          Patient given 2+ albuterol and 2 puffs of Atrovent here which did improve her breathing slightly.  Has elevated D-dimer here.  She remained tachypneic and was given a CT of the chest which was  negative for PE.  Patient was taken off oxygen and did desat and was placed back on 2 L.  Was given Solu-Medrol 125 IV push.  Will be admitted for COPD exacerbation. Final  Clinical Impression(s) / ED Diagnoses Final diagnoses:  None    Rx / DC Orders ED Discharge Orders    None       Lacretia Leigh, MD 01/14/20 2111

## 2020-01-15 ENCOUNTER — Inpatient Hospital Stay (HOSPITAL_COMMUNITY): Payer: Medicare PPO

## 2020-01-15 LAB — CBC
HCT: 41 % (ref 36.0–46.0)
HCT: 42.5 % (ref 36.0–46.0)
Hemoglobin: 12.9 g/dL (ref 12.0–15.0)
Hemoglobin: 13.5 g/dL (ref 12.0–15.0)
MCH: 29 pg (ref 26.0–34.0)
MCH: 29.5 pg (ref 26.0–34.0)
MCHC: 31.5 g/dL (ref 30.0–36.0)
MCHC: 31.8 g/dL (ref 30.0–36.0)
MCV: 91.2 fL (ref 80.0–100.0)
MCV: 93.8 fL (ref 80.0–100.0)
Platelets: 154 10*3/uL (ref 150–400)
Platelets: 167 10*3/uL (ref 150–400)
RBC: 4.37 MIL/uL (ref 3.87–5.11)
RBC: 4.66 MIL/uL (ref 3.87–5.11)
RDW: 14.5 % (ref 11.5–15.5)
RDW: 14.6 % (ref 11.5–15.5)
WBC: 4.3 10*3/uL (ref 4.0–10.5)
WBC: 4.9 10*3/uL (ref 4.0–10.5)
nRBC: 0 % (ref 0.0–0.2)
nRBC: 0 % (ref 0.0–0.2)

## 2020-01-15 LAB — BASIC METABOLIC PANEL
Anion gap: 9 (ref 5–15)
BUN: 21 mg/dL (ref 8–23)
CO2: 22 mmol/L (ref 22–32)
Calcium: 8.7 mg/dL — ABNORMAL LOW (ref 8.9–10.3)
Chloride: 108 mmol/L (ref 98–111)
Creatinine, Ser: 1.07 mg/dL — ABNORMAL HIGH (ref 0.44–1.00)
GFR calc Af Amer: 52 mL/min — ABNORMAL LOW (ref >60)
GFR calc non Af Amer: 45 mL/min — ABNORMAL LOW (ref >60)
Glucose, Bld: 142 mg/dL — ABNORMAL HIGH (ref 70–99)
Potassium: 4.4 mmol/L (ref 3.5–5.1)
Sodium: 139 mmol/L (ref 135–145)

## 2020-01-15 LAB — RESPIRATORY PANEL BY PCR

## 2020-01-15 LAB — HEMOGLOBIN A1C
Hgb A1c MFr Bld: 6.1 % — ABNORMAL HIGH (ref 4.8–5.6)
Mean Plasma Glucose: 128.37 mg/dL

## 2020-01-15 LAB — CREATININE, SERUM
Creatinine, Ser: 1.06 mg/dL — ABNORMAL HIGH (ref 0.44–1.00)
GFR calc Af Amer: 53 mL/min — ABNORMAL LOW (ref >60)
GFR calc non Af Amer: 46 mL/min — ABNORMAL LOW (ref >60)

## 2020-01-15 MED ORDER — PREDNISONE 20 MG PO TABS
40.0000 mg | ORAL_TABLET | Freq: Every day | ORAL | Status: DC
  Administered 2020-01-16: 40 mg via ORAL
  Filled 2020-01-15: qty 2

## 2020-01-15 MED ORDER — UMECLIDINIUM BROMIDE 62.5 MCG/INH IN AEPB
1.0000 | INHALATION_SPRAY | Freq: Every day | RESPIRATORY_TRACT | Status: DC
  Administered 2020-01-15 – 2020-01-16 (×2): 1 via RESPIRATORY_TRACT
  Filled 2020-01-15: qty 7

## 2020-01-15 MED ORDER — POLYETHYLENE GLYCOL 3350 17 G PO PACK
17.0000 g | PACK | Freq: Every day | ORAL | Status: DC
  Administered 2020-01-15: 17 g via ORAL
  Filled 2020-01-15: qty 1

## 2020-01-15 MED ORDER — HYDRALAZINE HCL 20 MG/ML IJ SOLN
5.0000 mg | Freq: Three times a day (TID) | INTRAMUSCULAR | Status: DC | PRN
  Administered 2020-01-15: 5 mg via INTRAVENOUS
  Filled 2020-01-15: qty 1

## 2020-01-15 MED ORDER — ALBUTEROL SULFATE (2.5 MG/3ML) 0.083% IN NEBU: 3.0000 mL | INHALATION_SOLUTION | Freq: Four times a day (QID) | RESPIRATORY_TRACT | Status: DC | PRN

## 2020-01-15 MED ORDER — FLUTICASONE FUROATE-VILANTEROL 100-25 MCG/INH IN AEPB
1.0000 | INHALATION_SPRAY | Freq: Every day | RESPIRATORY_TRACT | Status: DC
  Administered 2020-01-15 – 2020-01-16 (×2): 1 via RESPIRATORY_TRACT
  Filled 2020-01-15: qty 28

## 2020-01-15 NOTE — Progress Notes (Signed)
PROGRESS NOTE    [name]  ZWC:[id number] DOB: [birth year] DOA: 01/14/2020 PCP: Gloria Peng, NP      Brief Narrative:  [name] is a 86 y.o. F with COPD not on home O2, FEV1 84%, on chronic prednisone, HTN, CAD, CKD IIIa who presented for acute SOB.  Patient was in her normal state of health (can walk about 30 ft or up one flight of stairs, uses albuterol 2-3 times per day) until the morning of admission when she developed shortness of breath.  In the ER, she was hypoxic requiring supplemental oxygen to maintain oxygen saturation 92%. And was tachypneic to the 20s. CT angiogram of the chest showed no PE, only atelectasis in bilateral bases. She was started on steroids, bronchodilators, and the hospital service were asked to evaluate for admission.         Assessment & Plan:   Acute hypoxic respiratory failure due to COPD exacerbation -Continue steroids -Continue bronchodilators -Continue doxycycline -Continue home ICS/LAMA/LAMA  Hypertension Coronary artery disease, secondary prevention Blood pressure normal -Continue amlodipine, hydralazine, hydrochlorothiazide, atorvastatin  Chronic kidney disease stage IIIa Creatinine stable relative to baseline  Elevated BNP No new orthopnea, swelling or edema on CT chest to suggest CHF flare. -Follow-up with PCP for echo  Hypothyroidism -Continue Levothyroxine           Disposition: Status is: Inpatient  Remains inpatient appropriate because:Patient still has dyspnea with walking just to the door, significantly worse than her baseline.   Dispo: The patient is from: Home              Anticipated d/c is to: Home              Anticipated d/c date is: 1 day              Patient currently is not medically stable to d/c.              MDM: The below labs and imaging reports were reviewed and summarized above.  Medication management as above. This is a severe exacerbation of her chronic  disease.     DVT prophylaxis: enoxaparin (LOVENOX) injection 30 mg Start: 01/14/20 2345  Code Status: Full code Family Communication: [name] by phone    Consultants:     Procedures:     Antimicrobials:   Doxycycline  Culture data:              Subjective: Patient still extremely dyspneic. She appears dyspneic at rest, and with walking short distances, worse than her baseline. She has had no fever, sputum production, hemoptysis. No leg swelling orthopnea.  Objective: Vitals:   01/15/20 0126 01/15/20 0309 01/15/20 0730 01/15/20 1616  BP: (!) 182/78 (!) 145/61 (!) 179/73 (!) 163/78  Pulse: 73 63 69 76  Resp: (!) 21 16 18 17   Temp: 98.7 F (37.1 C) 98 F (36.7 C) 97.8 F (36.6 C) 97.7 F (36.5 C)  TempSrc:  Oral Oral Oral  SpO2: 98% 98% 97% 93%  Weight:      Height:        Intake/Output Summary (Last 24 hours) at 01/15/2020 1720 Last data filed at 01/15/2020 0655 Gross per 24 hour  Intake 283.75 ml  Output 1200 ml  Net -916.25 ml   Filed Weights   01/14/20 1549  Weight: 63.5 kg    Examination: General appearance: Thin elderly adult female, alert and in mild respiratory distress sitting in a chair.   HEENT: Anicteric, conjunctiva pink, lids and lashes  normal. No nasal deformity, discharge, epistaxis.  Lips moist, dentures in place, oropharynx tacky dry, no oral lesions, hearing normal.   Skin: Warm and dry. No jaundice.  No suspicious rashes or lesions. Cardiac: RRR, nl S1-S2, no murmurs appreciated.  Capillary refill is brisk.  JVP normal.  No LE edema.  Radial pulses 2+ and symmetric. Respiratory:, Pursed lip breathing with any exertion. No wheezing. No rales. Abdomen: Abdomen soft. No TTP or guarding. No ascites, distension, hepatosplenomegaly.   MSK: No deformities or effusions. Neuro: Awake and alert.  EOMI, moves all extremities. Speech fluent.    Psych: Sensorium intact and responding to questions, attention normal. Affect normal.   Judgment and insight appear normal.    Data Reviewed: I have personally reviewed following labs and imaging studies:  CBC: Recent Labs  Lab 01/14/20 1551 01/15/20 0010 01/15/20 0624  WBC 3.8* 4.9 4.3  NEUTROABS 3.5  --   --   HGB 13.4 13.5 12.9  HCT 42.4 42.5 41.0  MCV 91.8 91.2 93.8  PLT 175 167 893   Basic Metabolic Panel: Recent Labs  Lab 01/14/20 1551 01/15/20 0010 01/15/20 0624  NA 140  --  139  K 4.3  --  4.4  CL 107  --  108  CO2 23  --  22  GLUCOSE 206*  --  142*  BUN 21  --  21  CREATININE 1.25* 1.06* 1.07*  CALCIUM 9.0  --  8.7*   GFR: Estimated Creatinine Clearance: 31.4 mL/min (A) (by C-G formula based on SCr of 1.07 mg/dL (H)). Liver Function Tests: No results for input(s): AST, ALT, ALKPHOS, BILITOT, PROT, ALBUMIN in the last 168 hours. No results for input(s): LIPASE, AMYLASE in the last 168 hours. No results for input(s): AMMONIA in the last 168 hours. Coagulation Profile: No results for input(s): INR, PROTIME in the last 168 hours. Cardiac Enzymes: No results for input(s): CKTOTAL, CKMB, CKMBINDEX, TROPONINI in the last 168 hours. BNP (last 3 results) No results for input(s): PROBNP in the last 8760 hours. HbA1C: Recent Labs    01/15/20 0010  HGBA1C 6.1*   CBG: No results for input(s): GLUCAP in the last 168 hours. Lipid Profile: No results for input(s): CHOL, HDL, LDLCALC, TRIG, CHOLHDL, LDLDIRECT in the last 72 hours. Thyroid Function Tests: No results for input(s): TSH, T4TOTAL, FREET4, T3FREE, THYROIDAB in the last 72 hours. Anemia Panel: No results for input(s): VITAMINB12, FOLATE, FERRITIN, TIBC, IRON, RETICCTPCT in the last 72 hours. Urine analysis:    Component Value Date/Time   COLORURINE YELLOW 12/01/2019 1028   APPEARANCEUR [place] (A) 12/01/2019 1028   LABSPEC 1.015 12/01/2019 1028   PHURINE 6.0 12/01/2019 1028   GLUCOSEU NEGATIVE 12/01/2019 1028   HGBUR NEGATIVE 12/01/2019 1028   BILIRUBINUR NEGATIVE 12/01/2019 1028    BILIRUBINUR n 08/17/2012 [place] 12/01/2019 1028   PROTEINUR NEGATIVE 09/02/2018 1622   UROBILINOGEN 0.2 12/01/2019 1028   NITRITE NEGATIVE 12/01/2019 1028   LEUKOCYTESUR SMALL (A) 12/01/2019 1028   Sepsis Labs: @LABRCNTIP (procalcitonin:4,lacticacidven:4)  ) Recent Results (from the past 240 hour(s))  SARS Coronavirus 2 by RT PCR (hospital order, performed in [facility] hospital lab) Nasopharyngeal Nasopharyngeal Swab     Status: None   Collection Time: 01/14/20  9:52 PM   Specimen: Nasopharyngeal Swab  Result Value Ref Range Status   SARS Coronavirus 2 NEGATIVE NEGATIVE Final    Comment: (NOTE) SARS-CoV-2 target nucleic acids are NOT DETECTED.  The SARS-CoV-2 RNA is generally detectable in upper  and lower respiratory specimens during the acute phase of infection. The lowest concentration of SARS-CoV-2 viral copies this assay can detect is 250 copies / mL. A negative result does not preclude SARS-CoV-2 infection and should not be used as the sole basis for treatment or other patient management decisions.  A negative result may occur with improper specimen collection / handling, submission of specimen other than nasopharyngeal swab, presence of viral mutation(s) within the areas targeted by this assay, and inadequate number of viral copies (<250 copies / mL). A negative result must be combined with clinical observations, patient history, and epidemiological information.  Fact Sheet for Patients:   StrictlyIdeas.no  Fact Sheet for Healthcare Providers: BankingDealers.co.za  This test is not yet approved or  cleared by the [place] FDA and has been authorized for detection and/or diagnosis of SARS-CoV-2 by FDA under an Emergency Use Authorization (EUA).  This EUA will remain in effect (meaning this test can be used) for the duration of the COVID-19 declaration under Section 564(b)(1) of the Act, 21  U.S.C. section 360bbb-3(b)(1), unless the authorization is terminated or revoked sooner.  Performed at [facility], [place] [address], [place], [place] [address]   Respiratory Panel by PCR     Status: None   Collection Time: 01/15/20 12:10 AM   Specimen: Nasopharyngeal Swab; Respiratory  Result Value Ref Range Status   Adenovirus NOT DETECTED NOT DETECTED Final   Coronavirus 229E NOT DETECTED NOT DETECTED Final    Comment: (NOTE) The Coronavirus on the Respiratory Panel, DOES NOT test for the novel  Coronavirus (2019 nCoV)    Coronavirus HKU1 NOT DETECTED NOT DETECTED Final   Coronavirus NL63 NOT DETECTED NOT DETECTED Final   Coronavirus OC43 NOT DETECTED NOT DETECTED Final   Metapneumovirus NOT DETECTED NOT DETECTED Final   Rhinovirus / Enterovirus NOT DETECTED NOT DETECTED Final   Influenza A NOT DETECTED NOT DETECTED Final   Influenza B NOT DETECTED NOT DETECTED Final   Parainfluenza Virus 1 NOT DETECTED NOT DETECTED Final   Parainfluenza Virus 2 NOT DETECTED NOT DETECTED Final   Parainfluenza Virus 3 NOT DETECTED NOT DETECTED Final   Parainfluenza Virus 4 NOT DETECTED NOT DETECTED Final   Respiratory Syncytial Virus NOT DETECTED NOT DETECTED Final   Bordetella pertussis NOT DETECTED NOT DETECTED Final   Chlamydophila pneumoniae NOT DETECTED NOT DETECTED Final   Mycoplasma pneumoniae NOT DETECTED NOT DETECTED Final    Comment: Performed at [facility], [place]. [address] [place], [place] [address]         Radiology Studies: CT Angio Chest PE W/Cm &/Or Wo Cm  Result Date: 01/14/2020 CLINICAL DATA:  Shortness of breath. EXAM: CT ANGIOGRAPHY CHEST WITH CONTRAST TECHNIQUE: Multidetector CT imaging of the chest was performed using the standard protocol during bolus administration of intravenous contrast. Multiplanar CT image reconstructions and MIPs were obtained to evaluate the vascular anatomy. CONTRAST:  35mL OMNIPAQUE IOHEXOL 350 MG/ML SOLN COMPARISON:  April 05, 2016 FINDINGS: Cardiovascular: There is marked severity calcification and atherosclerosis of the thoracic aorta. Satisfactory opacification of the pulmonary arteries to the segmental level. No evidence of pulmonary embolism. There is mild cardiomegaly. No pericardial effusion. Moderate severity coronary artery calcification is seen. Mediastinum/Nodes: No enlarged mediastinal, hilar, or axillary lymph nodes. Thyroid gland, trachea, and esophagus demonstrate no significant findings. Lungs/Pleura: Mild atelectasis is seen within the posterior aspect of the bilateral lower lobes. There is a small left pleural effusion. No pneumothorax is identified. Upper Abdomen: Surgical  clips are seen within the gallbladder fossa. Musculoskeletal: No chest wall abnormality. No acute or significant osseous findings. Review of the MIP images confirms the above findings. IMPRESSION: 1. No evidence of pulmonary embolism. 2. Mild bilateral lower lobe atelectasis. 3. Small left pleural effusion. 4. Mild cardiomegaly. 5. Moderate severity coronary artery calcification. 6. Aortic atherosclerosis. Aortic Atherosclerosis (ICD10-I70.0). Electronically Signed   By: Virgina Norfolk M.D.   On: 01/14/2020 20:12   DG Chest Port 1 View  Result Date: 01/14/2020 CLINICAL DATA:  Shortness of breath EXAM: PORTABLE CHEST 1 VIEW COMPARISON:  September 09, 2019 FINDINGS: The heart size and mediastinal contours are stable. There is a small left pleural effusion unchanged. Mild hazy opacities are identified in bilateral lung bases. The visualized skeletal structures are unremarkable. IMPRESSION: Small left pleural effusion unchanged. Mild hazy opacities identified in bilateral lung bases, nonspecific but can be seen in pneumonitis or edema. Electronically Signed   By: Abelardo Diesel M.D.   On: 01/14/2020 16:38        Scheduled Meds: . aerochamber plus with mask  1 each Other UD  . amLODipine  10 mg Oral Daily  . atorvastatin  80 mg Oral QPM  .  doxycycline  100 mg Oral Q12H  . enoxaparin (LOVENOX) injection  30 mg Subcutaneous QHS  . fluticasone furoate-vilanterol  1 puff Inhalation Daily   And  . umeclidinium bromide  1 puff Inhalation Daily  . hydrALAZINE  75 mg Oral TID  . levothyroxine  75 mcg Oral QAC breakfast  . polyethylene glycol  17 g Oral Daily   Continuous Infusions: . sodium chloride 15 mL/hr at 01/14/20 1605     LOS: 1 day    Time spent: 35 minutes    Edwin Dada, MD [facility] 01/15/2020, 5:20 PM     Please page though [place] or Epic secure chat:  For [employer], Adult nurse

## 2020-01-15 NOTE — Plan of Care (Signed)
  Problem: Education: Goal: Knowledge of General Education information will improve Description: Including pain rating scale, medication(s)/side effects and non-pharmacologic comfort measures Outcome: Progressing   Problem: Health Behavior/Discharge Planning: Goal: Ability to manage health-related needs will improve Outcome: Progressing   Problem: Clinical Measurements: Goal: Ability to maintain clinical measurements within normal limits will improve Outcome: Progressing   Problem: Elimination: Goal: Will not experience complications related to urinary retention Outcome: Progressing   Problem: Pain Managment: Goal: General experience of comfort will improve Outcome: Progressing   Problem: Safety: Goal: Ability to remain free from injury will improve Outcome: Progressing

## 2020-01-15 NOTE — Plan of Care (Signed)

## 2020-01-15 NOTE — Plan of Care (Signed)
  Problem: Education: Goal: Knowledge of General Education information will improve Description: Including pain rating scale, medication(s)/side effects and non-pharmacologic comfort measures Outcome: Progressing   Problem: Education: Goal: Knowledge of General Education information will improve Description: Including pain rating scale, medication(s)/side effects and non-pharmacologic comfort measures Outcome: Progressing   Problem: Education: Goal: Knowledge of General Education information will improve Description: Including pain rating scale, medication(s)/side effects and non-pharmacologic comfort measures Outcome: Progressing   Problem: Education: Goal: Knowledge of General Education information will improve Description: Including pain rating scale, medication(s)/side effects and non-pharmacologic comfort measures Outcome: Progressing   Problem: Education: Goal: Knowledge of General Education information will improve Description: Including pain rating scale, medication(s)/side effects and non-pharmacologic comfort measures Outcome: Progressing   Problem: Education: Goal: Knowledge of General Education information will improve Description: Including pain rating scale, medication(s)/side effects and non-pharmacologic comfort measures Outcome: Progressing   

## 2020-01-16 MED ORDER — IPRATROPIUM-ALBUTEROL 0.5-2.5 (3) MG/3ML IN SOLN: 3.0000 mL | Freq: Four times a day (QID) | RESPIRATORY_TRACT | 11 refills | Status: DC | PRN

## 2020-01-16 MED ORDER — PREDNISONE 10 MG PO TABS: ORAL_TABLET | ORAL | 0 refills | Status: DC

## 2020-01-16 MED ORDER — DOXYCYCLINE HYCLATE 100 MG PO TABS: 100.0000 mg | ORAL_TABLET | Freq: Two times a day (BID) | ORAL | 0 refills | Status: DC

## 2020-01-16 MED ORDER — AMLODIPINE BESYLATE 10 MG PO TABS: 10.0000 mg | ORAL_TABLET | Freq: Every day | ORAL | 3 refills | Status: DC

## 2020-01-16 NOTE — Plan of Care (Signed)

## 2020-01-16 NOTE — Progress Notes (Signed)
D/C instructions given. Verbalized understanding. Saline lock removed.

## 2020-01-16 NOTE — Discharge Summary (Signed)
Physician Discharge Summary  [name] CBJ:[id number] DOB: Aug 30, 1924 DOA: 01/14/2020  PCP: [name], NP  Admit date: 01/14/2020 Discharge date: 01/16/2020  Admitted From: Home  Disposition:  Home   Recommendations for Outpatient Follow-up:  1. Follow up with PCP Dr. Carlisle Cater in 1-2 weeks 2. Dr. Carlisle Cater: Please refer patient for outpatient echocardiogram follow up      Home Health: None  Equipment/Devices: None  Discharge Condition: Good  CODE STATUS: FULL Diet recommendation: Regular  Brief/Interim Summary: [name] is a 86 y.o. F with COPD not on home O2, FEV1 84%, on chronic prednisone, HTN, CAD, CKD IIIa who presented for acute SOB.  Patient was in her normal state of health (can walk about 30 ft or up one flight of stairs, uses albuterol 2-3 times per day) until the morning of admission when she developed shortness of breath.  In the ER, she was hypoxic requiring supplemental oxygen to maintain oxygen saturation 92%. And was tachypneic to the 20s. CT angiogram of the chest showed no PE, only atelectasis in bilateral bases. She was started on steroids, bronchodilators, and the hospital service were asked to evaluate for admission.      PRINCIPAL HOSPITAL DIAGNOSIS: Acute hypoxic respiratory failure due to COPD exacerbation    Discharge Diagnoses:   Acute hypoxic respiratory failure due to COPD exacerbation The patient was admitted and started on steroids, bronchodilators, and antibiotics.  She was weaned off of oxygen, and creatinine improved to the point that she was able to ambulate short distances without severe dyspnea.  She was discharged home to continue 2-week prednisone taper back to her home dose.  I recommend close PCP follow-up.     Hypertension Coronary artery disease, secondary prevention Continue amlodipine, hydralazine, hydrochlorothiazide, atorvastatin  Chronic kidney disease stage IIIa Creatinine stable relative to  baseline  Elevated BNP No new orthopnea, swelling or edema on CT chest to suggest CHF flare. -Follow-up with PCP for echo  Hypothyroidism Continue Levothyroxine             Discharge Instructions  Discharge Instructions    Discharge instructions   Complete by: As directed    From Dr. Loleta Books: You were admitted for a COPD flare. You should take the following prednisone taper: Take prednisone 40 mg (4 tabs) for 4 days then  Take prednisone 30 mg (3 tabs) for 4 days then  Take prednisone 20 mg (2 tabs) for 4 days then  Take prednisone 10 mg (1 tab) for 4 days then reduce to previous 2.5 mg dose     Use your nebulizer (the short-acting, 4-hour Duo-nebs with albuterol and ipratropium) three times per day for the next week You may use extra treatments in between if needed  Take doxycycline 100 mg twice daily for 3 more days    Follow up with Dr. Carlisle Cater in 1 week   Increase activity slowly   Complete by: As directed      Allergies as of 01/16/2020      Reactions   Catapres [clonidine Hcl] Other (See Comments)   Made pt feel horrible, shaky, weak and nausea   Lisinopril Anaphylaxis   Tongue swelling   Labetalol Hcl Other (See Comments)   Caused bradycardia and syncope      Medication List    STOP taking these medications   aspirin EC 81 MG tablet   Lidocaine 4 % Ptch Commonly known as: HM Lidocaine Patch     TAKE these medications   AeroChamber MV inhaler Use  as instructed What changed:   how much to take  how to take this  when to take this  additional instructions   albuterol 108 (90 Base) MCG/ACT inhaler Commonly known as: ProAir HFA Inhale 2 puffs into the lungs every 6 (six) hours as needed for wheezing or shortness of breath.   amLODipine 10 MG tablet Commonly known as: NORVASC Take 1 tablet (10 mg total) by mouth daily.   atorvastatin 80 MG tablet Commonly known as: LIPITOR TAKE 1 TABLET BY MOUTH EVERY DAY AT 6PM What  changed: See the new instructions.   doxycycline 100 MG tablet Commonly known as: VIBRA-TABS Take 1 tablet (100 mg total) by mouth every 12 (twelve) hours.   hydrALAZINE 25 MG tablet Commonly known as: APRESOLINE TAKE 3 TABLETS BY MOUTH 3 TIMES A DAY What changed: See the new instructions.   hydrochlorothiazide 25 MG tablet Commonly known as: HYDRODIURIL Take 1 tablet (25 mg total) by mouth every other day. What changed:   when to take this  reasons to take this   ipratropium-albuterol 0.5-2.5 (3) MG/3ML Soln Commonly known as: DUONEB Take 3 mLs by nebulization every 6 (six) hours as needed (copd). What changed: Another medication with the same name was removed. Continue taking this medication, and follow the directions you see here.   latanoprost 0.005 % ophthalmic solution Commonly known as: XALATAN Place 1 drop into both eyes at bedtime.   levothyroxine 75 MCG tablet Commonly known as: SYNTHROID TAKE 1 TABLET BY MOUTH EVERY DAY BEFORE BREAKFAST What changed: See the new instructions.   methocarbamol 500 MG tablet Commonly known as: Robaxin Take 1 tablet (500 mg total) by mouth 3 (three) times daily for 7 days.   predniSONE 10 MG tablet Commonly known as: DELTASONE Take 40 mg (4 tabs) for 4 days then take 30 mg (3 tabs) for 4 days then take 20 mg (2 tabs) for 4 days then take 10 mg (1 tab) for 4 days then reduce to previous 2.5 mg dose What changed:   additional instructions  Another medication with the same name was removed. Continue taking this medication, and follow the directions you see here.   Trelegy Ellipta 100-62.5-25 MCG/INH Aepb Generic drug: Fluticasone-Umeclidin-Vilant TAKE 1 PUFF BY MOUTH EVERY DAY What changed: See the new instructions.       Follow-up Information    [name], NP. Schedule an appointment as soon as possible for a visit in 1 week(s).   Specialty: Family Medicine Contact information: [place]  [phone]              Allergies  Allergen Reactions  . Catapres [Clonidine Hcl] Other (See Comments)    Made pt feel horrible, shaky, weak and nausea  . Lisinopril Anaphylaxis    Tongue swelling  . Labetalol Hcl Other (See Comments)    Caused bradycardia and syncope    Consultations:     Procedures/Studies: CT Angio Chest PE W/Cm &/Or Wo Cm  Result Date: 01/14/2020 CLINICAL DATA:  Shortness of breath. EXAM: CT ANGIOGRAPHY CHEST WITH CONTRAST TECHNIQUE: Multidetector CT imaging of the chest was performed using the standard protocol during bolus administration of intravenous contrast. Multiplanar CT image reconstructions and MIPs were obtained to evaluate the vascular anatomy. CONTRAST:  46mL OMNIPAQUE IOHEXOL 350 MG/ML SOLN COMPARISON:  April 05, 2016 FINDINGS: Cardiovascular: There is marked severity calcification and atherosclerosis of the thoracic aorta. Satisfactory opacification of the pulmonary arteries to the segmental level. No evidence of  pulmonary embolism. There is mild cardiomegaly. No pericardial effusion. Moderate severity coronary artery calcification is seen. Mediastinum/Nodes: No enlarged mediastinal, hilar, or axillary lymph nodes. Thyroid gland, trachea, and esophagus demonstrate no significant findings. Lungs/Pleura: Mild atelectasis is seen within the posterior aspect of the bilateral lower lobes. There is a small left pleural effusion. No pneumothorax is identified. Upper Abdomen: Surgical clips are seen within the gallbladder fossa. Musculoskeletal: No chest wall abnormality. No acute or significant osseous findings. Review of the MIP images confirms the above findings. IMPRESSION: 1. No evidence of pulmonary embolism. 2. Mild bilateral lower lobe atelectasis. 3. Small left pleural effusion. 4. Mild cardiomegaly. 5. Moderate severity coronary artery calcification. 6. Aortic atherosclerosis. Aortic Atherosclerosis (ICD10-I70.0). Electronically Signed    By: Virgina Norfolk M.D.   On: 01/14/2020 20:12   DG Chest Port 1 View  Result Date: 01/14/2020 CLINICAL DATA:  Shortness of breath EXAM: PORTABLE CHEST 1 VIEW COMPARISON:  September 09, 2019 FINDINGS: The heart size and mediastinal contours are stable. There is a small left pleural effusion unchanged. Mild hazy opacities are identified in bilateral lung bases. The visualized skeletal structures are unremarkable. IMPRESSION: Small left pleural effusion unchanged. Mild hazy opacities identified in bilateral lung bases, nonspecific but can be seen in pneumonitis or edema. Electronically Signed   By: Abelardo Diesel M.D.   On: 01/14/2020 16:38       Subjective: Feeling well, similar to her baseline.  No sputum production, no fever, no confusion.  No chest pain, no significant change in wheezing.  Dyspnea slightly better.  No leg swelling, no orthopnea.  Discharge Exam: Vitals:   01/16/20 0718 01/16/20 0853  BP:  (!) 159/71  Pulse:  79  Resp:    Temp:  (!) 97.4 F (36.3 C)  SpO2: 92% 96%   Vitals:   01/15/20 2038 01/16/20 0320 01/16/20 0718 01/16/20 0853  BP: (!) 162/78 (!) 144/73  (!) 159/71  Pulse: 67 65  79  Resp: 16 16    Temp: 97.8 F (36.6 C) 98.4 F (36.9 C)  (!) 97.4 F (36.3 C)  TempSrc: Oral Oral  Oral  SpO2: 95% 90% 92% 96%  Weight:      Height:        General: Pt is alert, awake, not in acute distress Cardiovascular: RRR, nl S1-S2, no murmurs appreciated.   No LE edema.   Respiratory: Normal respiratory rate and rhythm although she does pursed lip breathing at times.  CTAB without rales or wheezes. Abdominal: Abdomen soft and non-tender.  No distension or HSM.   Neuro/Psych: Strength symmetric in upper and lower extremities.  Judgment and insight appear normal.   The results of significant diagnostics from this hospitalization (including imaging, microbiology, ancillary and laboratory) are listed below for reference.     Microbiology: Recent Results (from the past  240 hour(s))  SARS Coronavirus 2 by RT PCR (hospital order, performed in [facility] hospital lab) Nasopharyngeal Nasopharyngeal Swab     Status: None   Collection Time: 01/14/20  9:52 PM   Specimen: Nasopharyngeal Swab  Result Value Ref Range Status   SARS Coronavirus 2 NEGATIVE NEGATIVE Final    Comment: (NOTE) SARS-CoV-2 target nucleic acids are NOT DETECTED.  The SARS-CoV-2 RNA is generally detectable in upper and lower respiratory specimens during the acute phase of infection. The lowest concentration of SARS-CoV-2 viral copies this assay can detect is 250 copies / mL. A negative result does not preclude SARS-CoV-2 infection and should not be used as  the sole basis for treatment or other patient management decisions.  A negative result may occur with improper specimen collection / handling, submission of specimen other than nasopharyngeal swab, presence of viral mutation(s) within the areas targeted by this assay, and inadequate number of viral copies (<250 copies / mL). A negative result must be combined with clinical observations, patient history, and epidemiological information.  Fact Sheet for Patients:   StrictlyIdeas.no  Fact Sheet for Healthcare Providers: BankingDealers.co.za  This test is not yet approved or  cleared by the [place] FDA and has been authorized for detection and/or diagnosis of SARS-CoV-2 by FDA under an Emergency Use Authorization (EUA).  This EUA will remain in effect (meaning this test can be used) for the duration of the COVID-19 declaration under Section 564(b)(1) of the Act, 21 U.S.C. section 360bbb-3(b)(1), unless the authorization is terminated or revoked sooner.  Performed at [facility], [place] [address], [place], [place] [address]   Respiratory Panel by PCR     Status: None   Collection Time: 01/15/20 12:10 AM   Specimen: Nasopharyngeal Swab; Respiratory  Result Value Ref Range  Status   Adenovirus NOT DETECTED NOT DETECTED Final   Coronavirus 229E NOT DETECTED NOT DETECTED Final    Comment: (NOTE) The Coronavirus on the Respiratory Panel, DOES NOT test for the novel  Coronavirus (2019 nCoV)    Coronavirus HKU1 NOT DETECTED NOT DETECTED Final   Coronavirus NL63 NOT DETECTED NOT DETECTED Final   Coronavirus OC43 NOT DETECTED NOT DETECTED Final   Metapneumovirus NOT DETECTED NOT DETECTED Final   Rhinovirus / Enterovirus NOT DETECTED NOT DETECTED Final   Influenza A NOT DETECTED NOT DETECTED Final   Influenza B NOT DETECTED NOT DETECTED Final   Parainfluenza Virus 1 NOT DETECTED NOT DETECTED Final   Parainfluenza Virus 2 NOT DETECTED NOT DETECTED Final   Parainfluenza Virus 3 NOT DETECTED NOT DETECTED Final   Parainfluenza Virus 4 NOT DETECTED NOT DETECTED Final   Respiratory Syncytial Virus NOT DETECTED NOT DETECTED Final   Bordetella pertussis NOT DETECTED NOT DETECTED Final   Chlamydophila pneumoniae NOT DETECTED NOT DETECTED Final   Mycoplasma pneumoniae NOT DETECTED NOT DETECTED Final    Comment: Performed at [facility], [place]. [address] [place], [place] [address]     Labs: BNP (last 3 results) Recent Labs    09/09/19 1005 01/14/20 1551  BNP 112.3* 007.6*   Basic Metabolic Panel: Recent Labs  Lab 01/14/20 1551 01/15/20 0010 01/15/20 0624  NA 140  --  139  K 4.3  --  4.4  CL 107  --  108  CO2 23  --  22  GLUCOSE 206*  --  142*  BUN 21  --  21  CREATININE 1.25* 1.06* 1.07*  CALCIUM 9.0  --  8.7*   Liver Function Tests: No results for input(s): AST, ALT, ALKPHOS, BILITOT, PROT, ALBUMIN in the last 168 hours. No results for input(s): LIPASE, AMYLASE in the last 168 hours. No results for input(s): AMMONIA in the last 168 hours. CBC: Recent Labs  Lab 01/14/20 1551 01/15/20 0010 01/15/20 0624  WBC 3.8* 4.9 4.3  NEUTROABS 3.5  --   --   HGB 13.4 13.5 12.9  HCT 42.4 42.5 41.0  MCV 91.8 91.2 93.8  PLT 175 167 154   Cardiac  Enzymes: No results for input(s): CKTOTAL, CKMB, CKMBINDEX, TROPONINI in the last 168 hours. BNP: Invalid input(s): POCBNP CBG: No results for input(s): GLUCAP in the last  168 hours. D-Dimer Recent Labs    01/14/20 1551  DDIMER 1.89*   Hgb A1c Recent Labs    01/15/20 0010  HGBA1C 6.1*   Lipid Profile No results for input(s): CHOL, HDL, LDLCALC, TRIG, CHOLHDL, LDLDIRECT in the last 72 hours. Thyroid function studies No results for input(s): TSH, T4TOTAL, T3FREE, THYROIDAB in the last 72 hours.  Invalid input(s): FREET3 Anemia work up No results for input(s): VITAMINB12, FOLATE, FERRITIN, TIBC, IRON, RETICCTPCT in the last 72 hours. Urinalysis    Component Value Date/Time   COLORURINE YELLOW 12/01/2019 1028   APPEARANCEUR [place] (A) 12/01/2019 1028   LABSPEC 1.015 12/01/2019 1028   PHURINE 6.0 12/01/2019 1028   GLUCOSEU NEGATIVE 12/01/2019 1028   HGBUR NEGATIVE 12/01/2019 1028   BILIRUBINUR NEGATIVE 12/01/2019 1028   BILIRUBINUR n 08/17/2012 [place] 12/01/2019 1028   PROTEINUR NEGATIVE 09/02/2018 1622   UROBILINOGEN 0.2 12/01/2019 1028   NITRITE NEGATIVE 12/01/2019 1028   LEUKOCYTESUR SMALL (A) 12/01/2019 1028   Sepsis Labs Invalid input(s): PROCALCITONIN,  WBC,  LACTICIDVEN Microbiology Recent Results (from the past 240 hour(s))  SARS Coronavirus 2 by RT PCR (hospital order, performed in [facility] hospital lab) Nasopharyngeal Nasopharyngeal Swab     Status: None   Collection Time: 01/14/20  9:52 PM   Specimen: Nasopharyngeal Swab  Result Value Ref Range Status   SARS Coronavirus 2 NEGATIVE NEGATIVE Final    Comment: (NOTE) SARS-CoV-2 target nucleic acids are NOT DETECTED.  The SARS-CoV-2 RNA is generally detectable in upper and lower respiratory specimens during the acute phase of infection. The lowest concentration of SARS-CoV-2 viral copies this assay can detect is 250 copies / mL. A negative result does not preclude SARS-CoV-2  infection and should not be used as the sole basis for treatment or other patient management decisions.  A negative result may occur with improper specimen collection / handling, submission of specimen other than nasopharyngeal swab, presence of viral mutation(s) within the areas targeted by this assay, and inadequate number of viral copies (<250 copies / mL). A negative result must be combined with clinical observations, patient history, and epidemiological information.  Fact Sheet for Patients:   StrictlyIdeas.no  Fact Sheet for Healthcare Providers: BankingDealers.co.za  This test is not yet approved or  cleared by the [place] FDA and has been authorized for detection and/or diagnosis of SARS-CoV-2 by FDA under an Emergency Use Authorization (EUA).  This EUA will remain in effect (meaning this test can be used) for the duration of the COVID-19 declaration under Section 564(b)(1) of the Act, 21 U.S.C. section 360bbb-3(b)(1), unless the authorization is terminated or revoked sooner.  Performed at [facility], [place] [address] [place], [place] [address]   Respiratory Panel by PCR     Status: None   Collection Time: 01/15/20 12:10 AM   Specimen: Nasopharyngeal Swab; Respiratory  Result Value Ref Range Status   Adenovirus NOT DETECTED NOT DETECTED Final   Coronavirus 229E NOT DETECTED NOT DETECTED Final    Comment: (NOTE) The Coronavirus on the Respiratory Panel, DOES NOT test for the novel  Coronavirus (2019 nCoV)    Coronavirus HKU1 NOT DETECTED NOT DETECTED Final   Coronavirus NL63 NOT DETECTED NOT DETECTED Final   Coronavirus OC43 NOT DETECTED NOT DETECTED Final   Metapneumovirus NOT DETECTED NOT DETECTED Final   Rhinovirus / Enterovirus NOT DETECTED NOT DETECTED Final   Influenza A NOT DETECTED NOT DETECTED Final   Influenza B NOT DETECTED NOT DETECTED  Final   Parainfluenza Virus 1 NOT DETECTED NOT DETECTED Final    Parainfluenza Virus 2 NOT DETECTED NOT DETECTED Final   Parainfluenza Virus 3 NOT DETECTED NOT DETECTED Final   Parainfluenza Virus 4 NOT DETECTED NOT DETECTED Final   Respiratory Syncytial Virus NOT DETECTED NOT DETECTED Final   Bordetella pertussis NOT DETECTED NOT DETECTED Final   Chlamydophila pneumoniae NOT DETECTED NOT DETECTED Final   Mycoplasma pneumoniae NOT DETECTED NOT DETECTED Final    Comment: Performed at [facility], [place] [address] [place], [place] [address]     Time coordinating discharge: 25 minutes The  controlled substances registry was reviewed for this patient       SIGNED:   Edwin Dada, MD  [facility] 01/16/2020, 4:54 PM

## 2020-01-17 ENCOUNTER — Telehealth: Payer: Self-pay | Admitting: Adult Health

## 2020-01-17 NOTE — Telephone Encounter (Signed)
Transition Care Management Follow-up Telephone Call  Date of discharge and from where: 01/16/2020 from [facility]  How have you been since you were released from the hospital? [place] Has been having some feelings of SOB but has much improved since getting out of the hospital  Any questions or concerns? No   Items Reviewed:  Did the pt receive and understand the discharge instructions provided? Yes   Medications obtained and verified? Yes   Any new allergies since your discharge? Yes   Dietary orders reviewed? Yes  Do you have support at home? Yes   Functional Questionnaire: (I = Independent and D = Dependent) ADLs: I  Bathing/Dressing- I  Meal Prep- I  Eating- I  Maintaining continence- I  Transferring/Ambulation- I (get some assistance with climbing stairs  Managing Meds- I  Follow up appointments reviewed:   PCP Hospital f/u appt confirmed? Yes  Scheduled to see [name], NP  on 01/25/20 @ 9:30 am.  [facility] f/u appt confirmed? None scheduled at this time  Are transportation arrangements needed? Yes   If their condition worsens, is the pt aware to call PCP or go to the Emergency Dept.? Yes  Was the patient provided with contact information for the PCP's office or ED? Yes  Was to pt encouraged to call back with questions or concerns? Yes

## 2020-01-25 ENCOUNTER — Encounter: Payer: Self-pay | Admitting: Adult Health

## 2020-01-25 ENCOUNTER — Other Ambulatory Visit: Payer: Self-pay

## 2020-01-25 ENCOUNTER — Ambulatory Visit: Payer: Medicare PPO | Admitting: Adult Health

## 2020-01-25 VITALS — BP 160/70 | Temp 98.4°F | Wt 139.0 lb

## 2020-01-25 DIAGNOSIS — R7989 Other specified abnormal findings of blood chemistry: Secondary | ICD-10-CM

## 2020-01-25 DIAGNOSIS — J441 Chronic obstructive pulmonary disease with (acute) exacerbation: Secondary | ICD-10-CM

## 2020-01-25 DIAGNOSIS — H353113 Nonexudative age-related macular degeneration, right eye, advanced atrophic without subfoveal involvement: Secondary | ICD-10-CM | POA: Diagnosis not present

## 2020-01-25 DIAGNOSIS — E039 Hypothyroidism, unspecified: Secondary | ICD-10-CM | POA: Diagnosis not present

## 2020-01-25 DIAGNOSIS — I10 Essential (primary) hypertension: Secondary | ICD-10-CM | POA: Diagnosis not present

## 2020-01-25 DIAGNOSIS — E538 Deficiency of other specified B group vitamins: Secondary | ICD-10-CM | POA: Diagnosis not present

## 2020-01-25 DIAGNOSIS — N1831 Chronic kidney disease, stage 3a: Secondary | ICD-10-CM

## 2020-01-25 DIAGNOSIS — H353221 Exudative age-related macular degeneration, left eye, with active choroidal neovascularization: Secondary | ICD-10-CM | POA: Diagnosis not present

## 2020-01-25 DIAGNOSIS — H35033 Hypertensive retinopathy, bilateral: Secondary | ICD-10-CM | POA: Diagnosis not present

## 2020-01-25 DIAGNOSIS — H35371 Puckering of macula, right eye: Secondary | ICD-10-CM | POA: Diagnosis not present

## 2020-01-25 MED ORDER — CYANOCOBALAMIN 1000 MCG/ML IJ SOLN
1000.0000 ug | Freq: Once | INTRAMUSCULAR | Status: AC
  Administered 2020-01-25: 1000 ug via INTRAMUSCULAR

## 2020-01-25 NOTE — Patient Instructions (Signed)
It was great seeing you today and I am glad you are feeling better.   Finish your prednisone course and then go back to your daily dose.   Let me know if you need anything

## 2020-01-25 NOTE — Progress Notes (Signed)
Subjective:    Patient ID: [name], female    DOB: 23-Feb-1925, 86 y.o.   MRN: [id number]  HPI 86 year old female who  has a past medical history of Anxiety, Aortic arch atherosclerosis ([place]) (06/24/2014), Atherosclerotic ulcer of aorta ([place]) (06/24/2014), Carotid artery occlusion, Chronic kidney disease, COPD (chronic obstructive pulmonary disease) ([place]), DISC DISEASE, LUMBAR (12/16/2008), DIVERTICULOSIS, COLON (09/30/2008), DYSPNEA (07/13/2008), Graves disease, History of embolic stroke (7/59/1638), HYPERLIPIDEMIA (03/06/2007), HYPERTENSION (03/06/2007), HYPOTHYROIDISM (10/13/2007), OSTEOARTHRITIS (03/06/2007), Personal history of colonic polyps (09/30/2008), Stroke ([place]), TOBACCO USE, QUIT (04/12/2009), and WEAKNESS (11/09/2007).  TCM visit   Admit Date 01/14/2020 Discharge Date 01/16/2020  She presented to the emergency room with shortness of breath.  In the ER she was found to be hypoxic requiring supplemental oxygen to maintain oxygen saturation above 92%.  CT angiogram of the chest showed no PE, only atelectasis in bilateral bases.  She was started on steroids, bronchodilators and was admitted for further evaluation  Hospital Course  Acute hypoxic respiratory failure due to COPD exacerbation -She was admitted and started on steroids, bronchodilators, and antibiotics.  She was able to be weaned off supplemental oxygen and creatinine improved to the point that she was able to ambulate short distances without severe dyspnea.  She was discharged home to continue a 2-week prednisone taper and then back to her normal daily dose.  Hypertension/CAD -Continued on amlodipine, hydralazine, HCTZ, and atorvastatin.  Chronic kidney disease, stage IIIa -Creatinine stable relative to baseline upon discharge  Elevated BNP -No new Ortho pia, swelling, or edema on CT chest to suggest CHF flare -Recommend follow-up with PCP for echo  Hypothyroidism -Continued on levothyroxine  Today she reports that she  is feeling much improved since being discharged from the hospital.  On the day that she was discharged she went down to the beach for the weekend.  She feels as though her breathing is back to baseline and has not developed any fevers, chills, worsening shortness of breath or any acute issues since being discharged.  She would like to have her B12 infection today    Review of Systems  Constitutional: Negative.   HENT: Negative.   Respiratory: Positive for shortness of breath (chronic ).   Cardiovascular: Negative.   Gastrointestinal: Negative.   Genitourinary: Negative.   Musculoskeletal: Negative.   Neurological: Negative.   Hematological: Negative.   Psychiatric/Behavioral: Negative.   All other systems reviewed and are negative.  Past Medical History:  Diagnosis Date  . Anxiety   . Aortic arch atherosclerosis ([place]) 06/24/2014  . Atherosclerotic ulcer of aorta ([place]) 06/24/2014  . Carotid artery occlusion   . Chronic kidney disease   . COPD (chronic obstructive pulmonary disease) ([place])   . [place] DISEASE, LUMBAR 12/16/2008  . DIVERTICULOSIS, COLON 09/30/2008  . DYSPNEA 07/13/2008  . Graves disease   . History of embolic stroke 4/66/5993   Left brain  . HYPERLIPIDEMIA 03/06/2007  . HYPERTENSION 03/06/2007  . HYPOTHYROIDISM 10/13/2007  . OSTEOARTHRITIS 03/06/2007  . Personal history of colonic polyps 09/30/2008  . Stroke ([place])    06/2014           . TOBACCO USE, QUIT 04/12/2009  . WEAKNESS 11/09/2007    Social History   Socioeconomic History  . Marital status: Divorced    Spouse name: Not on file  . Number of children: 4  . Years of education: college  . Highest education level: Not on file  Occupational History  . Occupation: retired  Tobacco Use  .  Smoking status: Former Smoker    Packs/day: 1.50    Years: 29.00    Pack years: 43.50    Types: Cigarettes    Start date: 07/08/1949    Quit date: 07/08/1978    Years since quitting: 41.5  . Smokeless tobacco: Never Used   Vaping Use  . Vaping Use: Never used  Substance and Sexual Activity  . Alcohol use: Yes    Alcohol/week: 1.0 standard drink    Types: 1 Glasses of wine per week    Comment: "red wine"- some nights  . Drug use: No  . Sexual activity: Not on file  Other Topics Concern  . Not on file  Social History Narrative      Patient is right handed.   Patient drinks 1 cup caffeine daily.   Social Determinants of Health   Financial Resource Strain:   . Difficulty of Paying Living Expenses:   Food Insecurity:   . Worried About Charity fundraiser in the Last Year:   . Arboriculturist in the Last Year:   Transportation Needs:   . Film/video editor (Medical):   [name] Kitchen Lack of Transportation (Non-Medical):   Physical Activity:   . Days of Exercise per Week:   . Minutes of Exercise per Session:   Stress:   . Feeling of Stress :   Social Connections:   . Frequency of Communication with Friends and Family:   . Frequency of Social Gatherings with Friends and Family:   . Attends Religious Services:   . Active Member of Clubs or Organizations:   . Attends Archivist Meetings:   [name] Kitchen Marital Status:   Intimate Partner Violence:   . Fear of Current or Ex-Partner:   . Emotionally Abused:   [name] Kitchen Physically Abused:   . Sexually Abused:     Past Surgical History:  Procedure Laterality Date  . CARDIAC CATHETERIZATION    . CATARACT EXTRACTION Bilateral   . CHOLECYSTECTOMY    . ENDARTERECTOMY Left 11/13/2015   Procedure: LEFT CAROTID ARTERY ENDARTERECTOMY;  Surgeon: Conrad Oatman, MD;  Location: [facility];  Service: Vascular;  Laterality: Left;  . ESOPHAGOGASTRODUODENOSCOPY (EGD) WITH PROPOFOL N/A 10/11/2016   Procedure: ESOPHAGOGASTRODUODENOSCOPY (EGD) WITH PROPOFOL;  Surgeon: Mauri Pole, MD;  [facility]: [facility];  Service: Endoscopy;  Laterality: N/A;  . KNEE SURGERY    . PATCH ANGIOPLASTY Left 11/13/2015   Procedure: WITH 1CM X 6CM  XENOSURE BIOLOGIC PATCH ANGIOPLASTY;  Surgeon: Conrad Worth, MD;  Location: [facility];  Service: Vascular;  Laterality: Left;  . TEE WITHOUT CARDIOVERSION N/A 06/24/2014   Procedure: TRANSESOPHAGEAL ECHOCARDIOGRAM (TEE);  Surgeon: Sanda Klein, MD;  Location: [facility] ENDOSCOPY;  Service: Cardiovascular;  Laterality: N/A;    Family History  Problem Relation Age of Onset  . Stomach cancer Maternal Grandmother   . Colon cancer Neg Hx     Allergies  Allergen Reactions  . Catapres [Clonidine Hcl] Other (See Comments)    Made pt feel horrible, shaky, weak and nausea  . Lisinopril Anaphylaxis    Tongue swelling  . Labetalol Hcl Other (See Comments)    Caused bradycardia and syncope    Current Outpatient Medications on File Prior to Visit  Medication Sig Dispense Refill  . albuterol (PROAIR HFA) 108 (90 Base) MCG/ACT inhaler Inhale 2 puffs into the lungs every 6 (six) hours as needed for wheezing or shortness of breath. 8.5 Inhaler 5  . amLODipine (NORVASC) 10 MG tablet Take 1 tablet (  10 mg total) by mouth daily. 30 tablet 3  . atorvastatin (LIPITOR) 80 MG tablet TAKE 1 TABLET BY MOUTH EVERY DAY AT 6PM (Patient taking differently: Take 80 mg by mouth every evening. ) 90 tablet 1  . hydrALAZINE (APRESOLINE) 25 MG tablet TAKE 3 TABLETS BY MOUTH 3 TIMES A DAY (Patient taking differently: Take 75 mg by mouth 3 (three) times daily. ) 270 tablet 11  . hydrochlorothiazide (HYDRODIURIL) 25 MG tablet Take 1 tablet (25 mg total) by mouth every other day. (Patient taking differently: Take 25 mg by mouth as needed (fluid). ) 45 tablet 1  . ipratropium-albuterol (DUONEB) 0.5-2.5 (3) MG/3ML SOLN Take 3 mLs by nebulization every 6 (six) hours as needed (copd). 360 mL 11  . latanoprost (XALATAN) 0.005 % ophthalmic solution Place 1 drop into both eyes at bedtime.     [name] Kitchen levothyroxine (SYNTHROID) 75 MCG tablet TAKE 1 TABLET BY MOUTH EVERY DAY BEFORE BREAKFAST (Patient taking differently: Take 75 mcg by mouth daily before breakfast. ) 90 tablet 3  . predniSONE (DELTASONE)  10 MG tablet Take 40 mg (4 tabs) for 4 days then take 30 mg (3 tabs) for 4 days then take 20 mg (2 tabs) for 4 days then take 10 mg (1 tab) for 4 days then reduce to previous 2.5 mg dose 40 tablet 0  . Spacer/Aero-Holding Chambers (AEROCHAMBER MV) inhaler Use as instructed (Patient taking differently: 1 each by Other route as directed. ) 1 each 0  . TRELEGY ELLIPTA 100-62.5-25 MCG/INH AEPB TAKE 1 PUFF BY MOUTH EVERY DAY (Patient taking differently: Inhale 1 puff into the lungs daily. ) 60 each 2   No current facility-administered medications on file prior to visit.    BP (!) 160/70   Temp 98.4 F (36.9 C)   Wt 139 lb (63 kg)   BMI 22.44 kg/m        Objective:   Physical Exam Vitals and nursing note reviewed.  Constitutional:      Appearance: Normal appearance.  Cardiovascular:     Rate and Rhythm: Normal rate and regular rhythm.     Pulses: Normal pulses.     Heart sounds: Normal heart sounds.  Pulmonary:     Effort: Pulmonary effort is normal.  Skin:    General: Skin is warm and dry.     Capillary Refill: Capillary refill takes less than 2 seconds.  Neurological:     Mental Status: She is alert and oriented to person, place, and time.  Psychiatric:        Mood and Affect: Mood normal.        Behavior: Behavior normal.        Thought Content: Thought content normal.        Judgment: Judgment normal.       Assessment & Plan:  1. COPD with acute exacerbation ([place]) -We reviewed her hospital notes, imaging, labs, and discharge instructions.  All questions answered to the best of my ability.  She is back to baseline.  She was advised to finish off prednisone taper then go back to daily dose of prednisone.  Continue with nebulizers as needed.  2. Vitamin B 12 deficiency  - cyanocobalamin ((VITAMIN B-12)) injection 1,000 mcg  3. Elevated brain natriuretic peptide (BNP) level -Discussed getting an echocardiogram due to mildly elevated BNP.  She does not feel as though she  wants to have this done right now and I do not disagree with her.  We will hold off for the  time being.  She does have a follow-up with cardiology next month  4. Stage 3a chronic kidney disease - Baseline on discharge   5. Hypothyroidism, unspecified type - Continue with synthroid  6. Essential hypertension -BP elevated past baseline today but she took her medications just prior to arrival.  We will continue to monitor   [name], NP

## 2020-02-01 ENCOUNTER — Ambulatory Visit: Payer: Medicare PPO

## 2020-02-01 NOTE — Progress Notes (Signed)
CARDIOLOGY OFFICE NOTE  Date:  02/14/2020    [name] Date of Birth: April 25, 1925 Medical Record #[id number]  PCP:  [name], NP  Cardiologist:  Servando Snare   Chief Complaint  Patient presents with  . Follow-up    History of Present Illness: [name] is a 86 y.o. female who presents today for a follow up visit. Seen for Dr. Johnsie Cancel.Has primarily followed with me over the past several years.  She has ahistory of labile HTN (intolerant to labetalol and has not tolerated Clonidine), HLD, aortic stenosis, hypothyroidism, CVA (left parietal in Dec. 2015), carotid artery diease w/high-grade left ICA stenosis with plaque ulceration, COPD and pulmonary nodule.Low risk Myoview in 2017. Anxiety has played significant role in her BP management.    She was to have L CEA on May of 2017- looks like this complicated by a vocal cord paralysis and "with possible TE from extensive aortic arch atherosclerosis".She wasreluctant to start anticoagulation. She elected to take ASA and Plavix instead.But then was admitted withprofound anemia in the setting of aspirin and Plavix - transfused - had EGD and noted large AVM that was clipped. She is to stay just on aspirin despite being at risk for embolism from the aortic arch atherosclerosis.  I have followed her since. She does not check her BP at home anymore - makes her too anxious. Has had several COPD exacerbations - now on chronic steroids. Daily alcohol use continues. The pandemic was very hard on her. We have had to cut Norvasc back due to swelling and she has tended to forget some doses of her HCTZ. Last seen in February - felt to be stable from our standpoint. Alcohol continues. She has not been interested in follow up echos for her known AS.   Comes in today. Here with her daughter. Was in the hospital last month with COPD exacerbation. She was hypoxic and required short term oxygen. She was treated with steroids and  bronchodilators. Went to the beach the afternoon after discharge. Stayed a week to play cards. Still not getting together much with her friends. Her goal is to be able to go again to the beach in October to play cards. She did get vaccinated. She threw her card away. Her daughter is going to contact the health department and try to get a replacement. She had Xray's earlier today - having hip/sciatic pain. She is not on HCTZ at all any more - she never really took this regularly. Off aspirin now due to all the bruising from the steroids. She remains on half dose Norvasc. Looks like the hospitalist increased this last month back to 10 mg - she has had worsening swelling with this in the past - she did not increase it when she went home. Still has her glass of wine each night. She feels like she is ok and is happy with how she is doing.   Past Medical History:  Diagnosis Date  . Anxiety   . Aortic arch atherosclerosis ([place]) 06/24/2014  . Atherosclerotic ulcer of aorta ([place]) 06/24/2014  . Carotid artery occlusion   . Chronic kidney disease   . COPD (chronic obstructive pulmonary disease) ([place])   . [place] DISEASE, LUMBAR 12/16/2008  . DIVERTICULOSIS, COLON 09/30/2008  . DYSPNEA 07/13/2008  . Graves disease   . History of embolic stroke 6/71/2458   Left brain  . HYPERLIPIDEMIA 03/06/2007  . HYPERTENSION 03/06/2007  . HYPOTHYROIDISM 10/13/2007  . OSTEOARTHRITIS 03/06/2007  . Personal history of  colonic polyps 09/30/2008  . Stroke ([place])    06/2014           . TOBACCO USE, QUIT 04/12/2009  . WEAKNESS 11/09/2007    Past Surgical History:  Procedure Laterality Date  . CARDIAC CATHETERIZATION    . CATARACT EXTRACTION Bilateral   . CHOLECYSTECTOMY    . ENDARTERECTOMY Left 11/13/2015   Procedure: LEFT CAROTID ARTERY ENDARTERECTOMY;  Surgeon: Conrad [place], MD;  Location: [facility];  Service: Vascular;  Laterality: Left;  . ESOPHAGOGASTRODUODENOSCOPY (EGD) WITH PROPOFOL N/A 10/11/2016   Procedure:  ESOPHAGOGASTRODUODENOSCOPY (EGD) WITH PROPOFOL;  Surgeon: Mauri Pole, MD;  [facility];  Service: Endoscopy;  Laterality: N/A;  . KNEE SURGERY    . PATCH ANGIOPLASTY Left 11/13/2015   Procedure: WITH 1CM X 6CM  XENOSURE BIOLOGIC PATCH ANGIOPLASTY;  Surgeon: Conrad [place], MD;  Location: [facility];  Service: Vascular;  Laterality: Left;  . TEE WITHOUT CARDIOVERSION N/A 06/24/2014   Procedure: TRANSESOPHAGEAL ECHOCARDIOGRAM (TEE);  Surgeon: Sanda Klein, MD;  Location: [facility] ENDOSCOPY;  Service: Cardiovascular;  Laterality: N/A;     Medications: Current Meds  Medication Sig  . albuterol (PROAIR HFA) 108 (90 Base) MCG/ACT inhaler Inhale 2 puffs into the lungs every 6 (six) hours as needed for wheezing or shortness of breath.  [name] Kitchen amLODipine (NORVASC) 10 MG tablet Take 1 tablet (10 mg total) by mouth daily.  [name] Kitchen atorvastatin (LIPITOR) 80 MG tablet TAKE 1 TABLET BY MOUTH EVERY DAY AT 6PM (Patient taking differently: Take 80 mg by mouth every evening. )  . hydrALAZINE (APRESOLINE) 25 MG tablet TAKE 3 TABLETS BY MOUTH 3 TIMES A DAY (Patient taking differently: Take 75 mg by mouth 3 (three) times daily. )  . ipratropium-albuterol (DUONEB) 0.5-2.5 (3) MG/3ML SOLN Take 3 mLs by nebulization every 6 (six) hours as needed (copd).  . latanoprost (XALATAN) 0.005 % ophthalmic solution Place 1 drop into both eyes at bedtime.   [name] Kitchen levothyroxine (SYNTHROID) 75 MCG tablet TAKE 1 TABLET BY MOUTH EVERY DAY BEFORE BREAKFAST (Patient taking differently: Take 75 mcg by mouth daily before breakfast. )  . predniSONE (DELTASONE) 10 MG tablet Take 40 mg (4 tabs) for 4 days then take 30 mg (3 tabs) for 4 days then take 20 mg (2 tabs) for 4 days then take 10 mg (1 tab) for 4 days then reduce to previous 2.5 mg dose  . Spacer/Aero-Holding Chambers (AEROCHAMBER MV) inhaler Use as instructed (Patient taking differently: 1 each by Other route as directed. )  . TRELEGY ELLIPTA 100-62.5-25 MCG/INH AEPB TAKE 1 PUFF BY MOUTH  EVERY DAY (Patient taking differently: Inhale 1 puff into the lungs daily. )     Allergies: Allergies  Allergen Reactions  . Catapres [Clonidine Hcl] Other (See Comments)    Made pt feel horrible, shaky, weak and nausea  . Lisinopril Anaphylaxis    Tongue swelling  . Labetalol Hcl Other (See Comments)    Caused bradycardia and syncope    Social History: The patient  reports that she quit smoking about 41 years ago. Her smoking use included cigarettes. She started smoking about 70 years ago. She has a 43.50 pack-year smoking history. She has never used smokeless tobacco. She reports current alcohol use of about 1.0 standard drink of alcohol per week. She reports that she does not use drugs.   Family History: The patient's family history includes Stomach cancer in her maternal grandmother.   Review of Systems: Please see the history of present illness.  All other systems are reviewed and negative.   Physical Exam: VS:  BP (!) 158/78   Pulse 81   Ht 5\' 6"  (1.676 m)   Wt 134 lb (60.8 kg)   SpO2 95%   BMI 21.63 kg/m  .  BMI Body mass index is 21.63 kg/m.  Wt Readings from Last 3 Encounters:  02/14/20 134 lb (60.8 kg)  02/14/20 134 lb (60.8 kg)  01/25/20 139 lb (63 kg)    General: Alert and in no acute distress.  Her weight is actually down from 141 at my last visit from February.   Cardiac: Regular rate and rhythm. Harsh outflow murmur.  No significant edema.  Respiratory:  Decreased breath sounds but overall, with normal work of breathing.  GI: Soft and nontender.  MS: No deformity or atrophy. Gait and ROM intact. Using a walker.  Skin: Warm and dry. Color is normal. Bruising on all extremities.  Neuro:  Strength and sensation are intact and no gross focal deficits noted.  Psych: Alert, appropriate and with normal affect.   LABORATORY DATA:  EKG:  EKG is not ordered today.    Lab Results  Component Value Date   WBC 4.3 01/15/2020   HGB 12.9 01/15/2020   HCT 41.0  01/15/2020   PLT 154 01/15/2020   GLUCOSE 142 (H) 01/15/2020   CHOL 218 (H) 03/24/2019   TRIG 73 03/24/2019   HDL 111 03/24/2019   LDLDIRECT 141.7 05/01/2011   LDLCALC 94 03/24/2019   ALT 21 03/24/2019   AST 19 03/24/2019   NA 139 01/15/2020   K 4.4 01/15/2020   CL 108 01/15/2020   CREATININE 1.07 (H) 01/15/2020   BUN 21 01/15/2020   CO2 22 01/15/2020   TSH 1.13 12/01/2019   INR 0.93 08/17/2018   HGBA1C 6.1 (H) 01/15/2020     BNP (last 3 results) Recent Labs    09/09/19 1005 01/14/20 1551  BNP 112.3* 196.6*    ProBNP (last 3 results) No results for input(s): PROBNP in the last 8760 hours.   Other Studies Reviewed Today:  EchoStudy Conclusions7/2019  - Left ventricle: The cavity size was normal. There was moderate concentric hypertrophy. Systolic function was normal. The estimated ejection fraction was in the range of 55% to 60%. Wall motion was normal; there were no regional wall motion abnormalities. - Aortic valve: Valve mobility was mildly restricted. There was mild stenosis. There was trivial regurgitation. Valve area (VTI): 1.53 cm^2. Valve area (Vmean): 1.42 cm^2. - Atrial septum: There was increased thickness of the septum, consistent with lipomatous hypertrophy.  Impressions:  - Normal LV ejection fraction, mild aortic stenosis.   Nm Myocar Multi W/spect W/wall Motion / Ef 10/05/2015   There was no ST segment deviation noted during stress.  Defect 1: There is a medium defect of mild severity present in the mid anteroseptal, mid inferoseptal and apical septal location.  This is a low risk study.  The study is normal.  The left ventricular ejection fraction is normal (55-65%).  Nuclear stress EF: 57%. Low risk stress nuclear study with LBBB-related perfusion artifact, otherwise normal perfusion and normal left ventricular regional and global systolic function.   CTA Neck (10/09/15) Advanced atherosclerotic plaque of the  thoracic aorta and distal aortic arch.  50% diameter stenosis proximal right internal carotid artery  Irregular plaque of the distal left common carotid artery with plaque ulceration. Very irregular plaque involving the proximal left internal carotid artery. Critical 85% stenosis of the proximal left internal carotid  artery  Mild atherosclerotic disease in the right vertebral artery without significant vertebral artery stenosis.  Heterogeneous thyroid bilaterally with 11 mm right lower pole nodule. Recommend thyroid ultrasound.    Assessment/Plan:  1. Labile HTN - BP fair - we have not been able to have adequate control for a variety of reasons - mostly due to her personal choice. Bp is fair - would favor continuing her current regimen.   2. Carotid disease - prior CEA from 5366 - complicated by left vocal paralysis - she has severe atherosclerotic burden in the arch with concerns for embolism.   3. HLD - remains on high dose statin - labs by PCP  4. Severe COPD - on chronic steroid therapy.   5. Prior CVA  6. Daily alcohol use - this is not going to stop. She is drinking one glass a day.   7. Aortic stenosis - has not been interested in updating her echo. No cardinal symptoms - her breathing issues seem more related to her COPD.   16. Advancing age - she is pretty happy - focused on quality. Weight is down. Overall, seems to be holding her own but very tenuous situation.   Current medicines are reviewed with the patient today.  The patient does not have concerns regarding medicines other than what has been noted above.  The following changes have been made:  See above.  Labs/ tests ordered today include:   No orders of the defined types were placed in this encounter.    Disposition:   FU with [place] in 6 months.    Patient is agreeable to this plan and will call if any problems develop in the interim.   SignedTruitt Merle, NP  02/14/2020 3:03 PM  [facility] [address] [place], [place]  [address] Phone: [phone] Fax: [phone]

## 2020-02-03 ENCOUNTER — Other Ambulatory Visit: Payer: Self-pay | Admitting: Adult Health

## 2020-02-03 ENCOUNTER — Encounter: Payer: Self-pay | Admitting: Adult Health

## 2020-02-03 DIAGNOSIS — G8929 Other chronic pain: Secondary | ICD-10-CM

## 2020-02-09 ENCOUNTER — Encounter: Payer: Self-pay | Admitting: Adult Health

## 2020-02-11 ENCOUNTER — Encounter: Payer: Self-pay | Admitting: Adult Health

## 2020-02-11 NOTE — Telephone Encounter (Signed)
Just have her come in

## 2020-02-14 ENCOUNTER — Other Ambulatory Visit: Payer: Self-pay

## 2020-02-14 ENCOUNTER — Ambulatory Visit (INDEPENDENT_AMBULATORY_CARE_PROVIDER_SITE_OTHER)
Admission: RE | Admit: 2020-02-14 | Discharge: 2020-02-14 | Disposition: A | Discharge status: Home / Self Care | Payer: Medicare PPO | Source: Ambulatory Visit | Attending: Adult Health | Admitting: Adult Health

## 2020-02-14 ENCOUNTER — Encounter: Payer: Self-pay | Admitting: Nurse Practitioner

## 2020-02-14 ENCOUNTER — Ambulatory Visit: Payer: Medicare PPO | Admitting: Nurse Practitioner

## 2020-02-14 ENCOUNTER — Ambulatory Visit: Payer: Medicare PPO | Admitting: Adult Health

## 2020-02-14 ENCOUNTER — Encounter: Payer: Self-pay | Admitting: Adult Health

## 2020-02-14 VITALS — BP 176/82 | Temp 97.9°F | Wt 134.0 lb

## 2020-02-14 VITALS — BP 158/78 | HR 81 | Ht 66.0 in | Wt 134.0 lb

## 2020-02-14 DIAGNOSIS — I10 Essential (primary) hypertension: Secondary | ICD-10-CM

## 2020-02-14 DIAGNOSIS — I6523 Occlusion and stenosis of bilateral carotid arteries: Secondary | ICD-10-CM | POA: Diagnosis not present

## 2020-02-14 DIAGNOSIS — M5137 Other intervertebral disc degeneration, lumbosacral region: Secondary | ICD-10-CM | POA: Diagnosis not present

## 2020-02-14 DIAGNOSIS — G8929 Other chronic pain: Secondary | ICD-10-CM

## 2020-02-14 DIAGNOSIS — M5442 Lumbago with sciatica, left side: Secondary | ICD-10-CM | POA: Diagnosis not present

## 2020-02-14 DIAGNOSIS — E7800 Pure hypercholesterolemia, unspecified: Secondary | ICD-10-CM

## 2020-02-14 DIAGNOSIS — M1612 Unilateral primary osteoarthritis, left hip: Secondary | ICD-10-CM | POA: Diagnosis not present

## 2020-02-14 DIAGNOSIS — Z8673 Personal history of transient ischemic attack (TIA), and cerebral infarction without residual deficits: Secondary | ICD-10-CM | POA: Diagnosis not present

## 2020-02-14 DIAGNOSIS — M5126 Other intervertebral disc displacement, lumbar region: Secondary | ICD-10-CM | POA: Diagnosis not present

## 2020-02-14 DIAGNOSIS — M47816 Spondylosis without myelopathy or radiculopathy, lumbar region: Secondary | ICD-10-CM | POA: Diagnosis not present

## 2020-02-14 DIAGNOSIS — M5136 Other intervertebral disc degeneration, lumbar region: Secondary | ICD-10-CM | POA: Diagnosis not present

## 2020-02-14 MED ORDER — AMLODIPINE BESYLATE 10 MG PO TABS: 5.0000 mg | ORAL_TABLET | Freq: Every day | ORAL | 3 refills | Status: DC

## 2020-02-14 NOTE — Patient Instructions (Addendum)
After Visit Summary:  We will be checking the following labs today - NONE   Medication Instructions:    Continue with your current medicines.   Ok to stay off the aspirin  Ok to stay on the 1/2 dose Norvasc (Amlodipine)   If you need a refill on your cardiac medications before your next appointment, please call your pharmacy.     Testing/Procedures To Be Arranged:  N/A  Follow-Up:   See me in 6 months.     At [facility], you and your health needs are our priority.  As part of our continuing mission to provide you with exceptional heart care, we have created designated Provider Care Teams.  These Care Teams include your primary Cardiologist (physician) and Advanced Practice Providers (APPs -  Physician Assistants and Nurse Practitioners) who all work together to provide you with the care you need, when you need it.  Special Instructions:  . Stay safe, wash your hands for at least 20 seconds and wear a mask when needed.  . It was good to talk with you both today.    Call the [place] office at [phone] if you have any questions, problems or concerns.

## 2020-02-14 NOTE — Progress Notes (Addendum)
Subjective:    Patient ID: [name], female    DOB: [birth year], 86 y.o.   MRN: [id number]  HPI 86 year old female who  has a past medical history of Anxiety, Aortic arch atherosclerosis ([place]) (06/24/2014), Atherosclerotic ulcer of aorta ([place]) (06/24/2014), Carotid artery occlusion, Chronic kidney disease, COPD (chronic obstructive pulmonary disease) ([place]), DISC DISEASE, LUMBAR (12/16/2008), DIVERTICULOSIS, COLON (09/30/2008), DYSPNEA (07/13/2008), Graves disease, History of embolic stroke (12/26/3084), HYPERLIPIDEMIA (03/06/2007), HYPERTENSION (03/06/2007), HYPOTHYROIDISM (10/13/2007), OSTEOARTHRITIS (03/06/2007), Personal history of colonic polyps (09/30/2008), Stroke ([place]), TOBACCO USE, QUIT (04/12/2009), and WEAKNESS (11/09/2007).  She presents to the office today for continued left sided low back and left sided sciatica pain. Pain is worse and walking and sitting. In the past she has tried Robaxin, Tylenol 3, and prednisone taper without much relief. She reports " this sciatica and hip pain is killing me, I am in so much pain".  She finds it difficult to get out of chairs and out of bed without being in excruciating pain. She is unable to walk up stairs   Pain level is 6-8/10 constantly.   She has found that Extra strength tylenol has been helpful to some degree but she does not want to take any more medications.    Review of Systems See HPI   Past Medical History:  Diagnosis Date  . Anxiety   . Aortic arch atherosclerosis ([place]) 06/24/2014  . Atherosclerotic ulcer of aorta ([place]) 06/24/2014  . Carotid artery occlusion   . Chronic kidney disease   . COPD (chronic obstructive pulmonary disease) ([place])   . [place] DISEASE, LUMBAR 12/16/2008  . DIVERTICULOSIS, COLON 09/30/2008  . DYSPNEA 07/13/2008  . Graves disease   . History of embolic stroke 5/78/4696   Left brain  . HYPERLIPIDEMIA 03/06/2007  . HYPERTENSION 03/06/2007  . HYPOTHYROIDISM 10/13/2007  . OSTEOARTHRITIS 03/06/2007  . Personal history  of colonic polyps 09/30/2008  . Stroke ([place])    06/2014           . TOBACCO USE, QUIT 04/12/2009  . WEAKNESS 11/09/2007    Social History   Socioeconomic History  . Marital status: Divorced    Spouse name: Not on file  . Number of children: 4  . Years of education: college  . Highest education level: Not on file  Occupational History  . Occupation: retired  Tobacco Use  . Smoking status: Former Smoker    Packs/day: 1.50    Years: 29.00    Pack years: 43.50    Types: Cigarettes    Start date: 07/08/1949    Quit date: 07/08/1978    Years since quitting: 41.6  . Smokeless tobacco: Never Used  Vaping Use  . Vaping Use: Never used  Substance and Sexual Activity  . Alcohol use: Yes    Alcohol/week: 1.0 standard drink    Types: 1 Glasses of wine per week    Comment: "red wine"- some nights  . Drug use: No  . Sexual activity: Not on file  Other Topics Concern  . Not on file  Social History Narrative      Patient is right handed.   Patient drinks 1 cup caffeine daily.   Social Determinants of Health   Financial Resource Strain:   . Difficulty of Paying Living Expenses:   Food Insecurity:   . Worried About Charity fundraiser in the Last Year:   . Arboriculturist in the Last Year:   Transportation Needs:   . Lack of Transportation (  Medical):   [name] Kitchen Lack of Transportation (Non-Medical):   Physical Activity:   . Days of Exercise per Week:   . Minutes of Exercise per Session:   Stress:   . Feeling of Stress :   Social Connections:   . Frequency of Communication with Friends and Family:   . Frequency of Social Gatherings with Friends and Family:   . Attends Religious Services:   . Active Member of Clubs or Organizations:   . Attends Archivist Meetings:   [name] Kitchen Marital Status:   Intimate Partner Violence:   . Fear of Current or Ex-Partner:   . Emotionally Abused:   [name] Kitchen Physically Abused:   . Sexually Abused:     Past Surgical History:  Procedure Laterality Date  .  CARDIAC CATHETERIZATION    . CATARACT EXTRACTION Bilateral   . CHOLECYSTECTOMY    . ENDARTERECTOMY Left 11/13/2015   Procedure: LEFT CAROTID ARTERY ENDARTERECTOMY;  Surgeon: [name] Tetherow, MD;  Location: [facility];  Service: Vascular;  Laterality: Left;  . ESOPHAGOGASTRODUODENOSCOPY (EGD) WITH PROPOFOL N/A 10/11/2016   Procedure: ESOPHAGOGASTRODUODENOSCOPY (EGD) WITH PROPOFOL;  Surgeon: Gloria Pole, MD;  [facility];  Service: Endoscopy;  Laterality: N/A;  . KNEE SURGERY    . PATCH ANGIOPLASTY Left 11/13/2015   Procedure: WITH 1CM X 6CM  XENOSURE BIOLOGIC PATCH ANGIOPLASTY;  Surgeon: [name] , MD;  Location: [facility];  Service: Vascular;  Laterality: Left;  . TEE WITHOUT CARDIOVERSION N/A 06/24/2014   Procedure: TRANSESOPHAGEAL ECHOCARDIOGRAM (TEE);  Surgeon: Gloria Klein, MD;  Location[facility] ENDOSCOPY;  Service: Cardiovascular;  Laterality: N/A;    Family History  Problem Relation Age of Onset  . Stomach cancer Maternal Grandmother   . Colon cancer Neg Hx     Allergies  Allergen Reactions  . Catapres [Clonidine Hcl] Other (See Comments)    Made pt feel horrible, shaky, weak and nausea  . Lisinopril Anaphylaxis    Tongue swelling  . Labetalol Hcl Other (See Comments)    Caused bradycardia and syncope    Current Outpatient Medications on File Prior to Visit  Medication Sig Dispense Refill  . albuterol (PROAIR HFA) 108 (90 Base) MCG/ACT inhaler Inhale 2 puffs into the lungs every 6 (six) hours as needed for wheezing or shortness of breath. 8.5 Inhaler 5  . amLODipine (NORVASC) 10 MG tablet Take 1 tablet (10 mg total) by mouth daily. 30 tablet 3  . atorvastatin (LIPITOR) 80 MG tablet TAKE 1 TABLET BY MOUTH EVERY DAY AT 6PM (Patient taking differently: Take 80 mg by mouth every evening. ) 90 tablet 1  . hydrALAZINE (APRESOLINE) 25 MG tablet TAKE 3 TABLETS BY MOUTH 3 TIMES A DAY (Patient taking differently: Take 75 mg by mouth 3 (three) times daily. ) 270 tablet 11  .  hydrochlorothiazide (HYDRODIURIL) 25 MG tablet Take 1 tablet (25 mg total) by mouth every other day. (Patient taking differently: Take 25 mg by mouth as needed (fluid). ) 45 tablet 1  . ipratropium-albuterol (DUONEB) 0.5-2.5 (3) MG/3ML SOLN Take 3 mLs by nebulization every 6 (six) hours as needed (copd). 360 mL 11  . latanoprost (XALATAN) 0.005 % ophthalmic solution Place 1 drop into both eyes at bedtime.     [name] Kitchen levothyroxine (SYNTHROID) 75 MCG tablet TAKE 1 TABLET BY MOUTH EVERY DAY BEFORE BREAKFAST (Patient taking differently: Take 75 mcg by mouth daily before breakfast. ) 90 tablet 3  . predniSONE (DELTASONE) 10 MG tablet Take 40 mg (4 tabs) for 4 days then  take 30 mg (3 tabs) for 4 days then take 20 mg (2 tabs) for 4 days then take 10 mg (1 tab) for 4 days then reduce to previous 2.5 mg dose 40 tablet 0  . Spacer/Aero-Holding Chambers (AEROCHAMBER MV) inhaler Use as instructed (Patient taking differently: 1 each by Other route as directed. ) 1 each 0  . TRELEGY ELLIPTA 100-62.5-25 MCG/INH AEPB TAKE 1 PUFF BY MOUTH EVERY DAY (Patient taking differently: Inhale 1 puff into the lungs daily. ) 60 each 2   No current facility-administered medications on file prior to visit.    BP (!) 176/82   Temp 97.9 F (36.6 C) (Oral)   Wt 134 lb (60.8 kg)   BMI 21.63 kg/m       Objective:   Physical Exam Vitals and nursing note reviewed.  Constitutional:      Appearance: Normal appearance.  Cardiovascular:     Rate and Rhythm: Regular rhythm.  Musculoskeletal:        General: Tenderness (along left sciatic nerve ) present. Normal range of motion.  Skin:    General: Skin is warm and dry.     Capillary Refill: Capillary refill takes less than 2 seconds.  Neurological:     General: No focal deficit present.     Mental Status: She is alert and oriented to person, place, and time. Mental status is at baseline.     Gait: Gait abnormal (walking with left sided limp. Using cane).     Comments: Bilateral  upper and lower extremity strength is 3/5       Assessment & Plan:  1. Chronic left-sided low back pain with left-sided sciatica - We discussed different treatments including referral to spine specialist, MRI, and home health care. She would like to proceed with home health PT  I also think she would benefit from a manual wheelchair and stair lift  Patient suffers from chronic left sided low back pain with left sided sciatica, spinal stenosis, and COPD which impairs their ability to perform daily activities like toileting, feeding, dressing, grooming, bathing. In the home a cane, walker or crutch will not resolve patients issue with performing activities of daily living. A wheelchair will allow patient to safely perform daily activities. Patient can safely propel the wheel chair in the home or has a caregiver who can provide assistance.    - Ok to take an extra strength tylenol TID  - DG Lumbar Spine Complete; Future - DG Hip Unilat W OR W/O Pelvis 2-3 Views Left; Future - Ambulatory referral to [facility], NP

## 2020-02-16 DIAGNOSIS — M1990 Unspecified osteoarthritis, unspecified site: Secondary | ICD-10-CM | POA: Diagnosis not present

## 2020-02-16 DIAGNOSIS — N189 Chronic kidney disease, unspecified: Secondary | ICD-10-CM | POA: Diagnosis not present

## 2020-02-16 DIAGNOSIS — M5186 Other intervertebral disc disorders, lumbar region: Secondary | ICD-10-CM | POA: Diagnosis not present

## 2020-02-16 DIAGNOSIS — M5442 Lumbago with sciatica, left side: Secondary | ICD-10-CM | POA: Diagnosis not present

## 2020-02-16 DIAGNOSIS — I129 Hypertensive chronic kidney disease with stage 1 through stage 4 chronic kidney disease, or unspecified chronic kidney disease: Secondary | ICD-10-CM | POA: Diagnosis not present

## 2020-02-16 DIAGNOSIS — E039 Hypothyroidism, unspecified: Secondary | ICD-10-CM | POA: Diagnosis not present

## 2020-02-16 DIAGNOSIS — J449 Chronic obstructive pulmonary disease, unspecified: Secondary | ICD-10-CM | POA: Diagnosis not present

## 2020-02-16 DIAGNOSIS — I700 Atherosclerosis of aorta: Secondary | ICD-10-CM | POA: Diagnosis not present

## 2020-02-16 DIAGNOSIS — F419 Anxiety disorder, unspecified: Secondary | ICD-10-CM | POA: Diagnosis not present

## 2020-02-17 ENCOUNTER — Encounter: Payer: Self-pay | Admitting: Adult Health

## 2020-02-18 ENCOUNTER — Encounter: Payer: Self-pay | Admitting: Adult Health

## 2020-02-21 DIAGNOSIS — M5186 Other intervertebral disc disorders, lumbar region: Secondary | ICD-10-CM | POA: Diagnosis not present

## 2020-02-21 DIAGNOSIS — N189 Chronic kidney disease, unspecified: Secondary | ICD-10-CM | POA: Diagnosis not present

## 2020-02-21 DIAGNOSIS — M1990 Unspecified osteoarthritis, unspecified site: Secondary | ICD-10-CM | POA: Diagnosis not present

## 2020-02-21 DIAGNOSIS — F419 Anxiety disorder, unspecified: Secondary | ICD-10-CM | POA: Diagnosis not present

## 2020-02-21 DIAGNOSIS — M5442 Lumbago with sciatica, left side: Secondary | ICD-10-CM | POA: Diagnosis not present

## 2020-02-21 DIAGNOSIS — I129 Hypertensive chronic kidney disease with stage 1 through stage 4 chronic kidney disease, or unspecified chronic kidney disease: Secondary | ICD-10-CM | POA: Diagnosis not present

## 2020-02-21 DIAGNOSIS — I700 Atherosclerosis of aorta: Secondary | ICD-10-CM | POA: Diagnosis not present

## 2020-02-21 DIAGNOSIS — E039 Hypothyroidism, unspecified: Secondary | ICD-10-CM | POA: Diagnosis not present

## 2020-02-21 DIAGNOSIS — J449 Chronic obstructive pulmonary disease, unspecified: Secondary | ICD-10-CM | POA: Diagnosis not present

## 2020-02-25 DIAGNOSIS — M1990 Unspecified osteoarthritis, unspecified site: Secondary | ICD-10-CM | POA: Diagnosis not present

## 2020-02-25 DIAGNOSIS — N189 Chronic kidney disease, unspecified: Secondary | ICD-10-CM | POA: Diagnosis not present

## 2020-02-25 DIAGNOSIS — J449 Chronic obstructive pulmonary disease, unspecified: Secondary | ICD-10-CM | POA: Diagnosis not present

## 2020-02-25 DIAGNOSIS — I700 Atherosclerosis of aorta: Secondary | ICD-10-CM | POA: Diagnosis not present

## 2020-02-25 DIAGNOSIS — E039 Hypothyroidism, unspecified: Secondary | ICD-10-CM | POA: Diagnosis not present

## 2020-02-25 DIAGNOSIS — M5186 Other intervertebral disc disorders, lumbar region: Secondary | ICD-10-CM | POA: Diagnosis not present

## 2020-02-25 DIAGNOSIS — I129 Hypertensive chronic kidney disease with stage 1 through stage 4 chronic kidney disease, or unspecified chronic kidney disease: Secondary | ICD-10-CM | POA: Diagnosis not present

## 2020-02-25 DIAGNOSIS — F419 Anxiety disorder, unspecified: Secondary | ICD-10-CM | POA: Diagnosis not present

## 2020-02-25 DIAGNOSIS — M5442 Lumbago with sciatica, left side: Secondary | ICD-10-CM | POA: Diagnosis not present

## 2020-02-28 DIAGNOSIS — H353114 Nonexudative age-related macular degeneration, right eye, advanced atrophic with subfoveal involvement: Secondary | ICD-10-CM | POA: Diagnosis not present

## 2020-02-28 DIAGNOSIS — H353221 Exudative age-related macular degeneration, left eye, with active choroidal neovascularization: Secondary | ICD-10-CM | POA: Diagnosis not present

## 2020-02-29 ENCOUNTER — Ambulatory Visit (INDEPENDENT_AMBULATORY_CARE_PROVIDER_SITE_OTHER): Payer: Medicare PPO

## 2020-02-29 ENCOUNTER — Other Ambulatory Visit: Payer: Self-pay

## 2020-02-29 ENCOUNTER — Telehealth: Payer: Self-pay | Admitting: Adult Health

## 2020-02-29 DIAGNOSIS — I129 Hypertensive chronic kidney disease with stage 1 through stage 4 chronic kidney disease, or unspecified chronic kidney disease: Secondary | ICD-10-CM | POA: Diagnosis not present

## 2020-02-29 DIAGNOSIS — N189 Chronic kidney disease, unspecified: Secondary | ICD-10-CM | POA: Diagnosis not present

## 2020-02-29 DIAGNOSIS — E538 Deficiency of other specified B group vitamins: Secondary | ICD-10-CM | POA: Diagnosis not present

## 2020-02-29 DIAGNOSIS — F419 Anxiety disorder, unspecified: Secondary | ICD-10-CM | POA: Diagnosis not present

## 2020-02-29 DIAGNOSIS — I700 Atherosclerosis of aorta: Secondary | ICD-10-CM | POA: Diagnosis not present

## 2020-02-29 DIAGNOSIS — M5442 Lumbago with sciatica, left side: Secondary | ICD-10-CM | POA: Diagnosis not present

## 2020-02-29 DIAGNOSIS — M1990 Unspecified osteoarthritis, unspecified site: Secondary | ICD-10-CM | POA: Diagnosis not present

## 2020-02-29 DIAGNOSIS — J449 Chronic obstructive pulmonary disease, unspecified: Secondary | ICD-10-CM | POA: Diagnosis not present

## 2020-02-29 DIAGNOSIS — M5186 Other intervertebral disc disorders, lumbar region: Secondary | ICD-10-CM | POA: Diagnosis not present

## 2020-02-29 DIAGNOSIS — E039 Hypothyroidism, unspecified: Secondary | ICD-10-CM | POA: Diagnosis not present

## 2020-02-29 MED ORDER — CYANOCOBALAMIN 1000 MCG/ML IJ SOLN
1000.0000 ug | Freq: Once | INTRAMUSCULAR | Status: AC
  Administered 2020-02-29: 1000 ug via INTRAMUSCULAR

## 2020-02-29 NOTE — Telephone Encounter (Signed)
Needing verbal orders for PT.

## 2020-02-29 NOTE — Patient Instructions (Signed)
Health Maintenance Due  Topic Date Due   DEXA SCAN  Never done   TETANUS/TDAP  12/13/2018   COVID-19 Vaccine (2 - Moderna 2-dose series) 09/03/2019   INFLUENZA VACCINE  02/06/2020    No flowsheet data found.

## 2020-02-29 NOTE — Telephone Encounter (Signed)
Okay for verbal orders? Please advise 

## 2020-02-29 NOTE — Progress Notes (Signed)
Per orders of Nafziger, Tommi Rumps, NP, injection of B12 given in Right deltoid by Franco Collet. Patient tolerated injection well.  Lab Results  Component Value Date   [id number] 269 01/06/2020

## 2020-02-29 NOTE — Telephone Encounter (Signed)
Ok for verbal orders ?

## 2020-03-01 NOTE — Telephone Encounter (Signed)
Spoke with Liji and gave verbal orders as requested.

## 2020-03-02 DIAGNOSIS — F419 Anxiety disorder, unspecified: Secondary | ICD-10-CM | POA: Diagnosis not present

## 2020-03-02 DIAGNOSIS — M5442 Lumbago with sciatica, left side: Secondary | ICD-10-CM | POA: Diagnosis not present

## 2020-03-02 DIAGNOSIS — M1990 Unspecified osteoarthritis, unspecified site: Secondary | ICD-10-CM | POA: Diagnosis not present

## 2020-03-02 DIAGNOSIS — M5186 Other intervertebral disc disorders, lumbar region: Secondary | ICD-10-CM | POA: Diagnosis not present

## 2020-03-02 DIAGNOSIS — N189 Chronic kidney disease, unspecified: Secondary | ICD-10-CM | POA: Diagnosis not present

## 2020-03-02 DIAGNOSIS — I700 Atherosclerosis of aorta: Secondary | ICD-10-CM | POA: Diagnosis not present

## 2020-03-02 DIAGNOSIS — I129 Hypertensive chronic kidney disease with stage 1 through stage 4 chronic kidney disease, or unspecified chronic kidney disease: Secondary | ICD-10-CM | POA: Diagnosis not present

## 2020-03-02 DIAGNOSIS — E039 Hypothyroidism, unspecified: Secondary | ICD-10-CM | POA: Diagnosis not present

## 2020-03-02 DIAGNOSIS — J449 Chronic obstructive pulmonary disease, unspecified: Secondary | ICD-10-CM | POA: Diagnosis not present

## 2020-03-03 ENCOUNTER — Ambulatory Visit: Payer: Medicare PPO

## 2020-03-07 DIAGNOSIS — I700 Atherosclerosis of aorta: Secondary | ICD-10-CM | POA: Diagnosis not present

## 2020-03-07 DIAGNOSIS — M5186 Other intervertebral disc disorders, lumbar region: Secondary | ICD-10-CM | POA: Diagnosis not present

## 2020-03-07 DIAGNOSIS — E039 Hypothyroidism, unspecified: Secondary | ICD-10-CM | POA: Diagnosis not present

## 2020-03-07 DIAGNOSIS — F419 Anxiety disorder, unspecified: Secondary | ICD-10-CM | POA: Diagnosis not present

## 2020-03-07 DIAGNOSIS — M1990 Unspecified osteoarthritis, unspecified site: Secondary | ICD-10-CM | POA: Diagnosis not present

## 2020-03-07 DIAGNOSIS — M5442 Lumbago with sciatica, left side: Secondary | ICD-10-CM | POA: Diagnosis not present

## 2020-03-07 DIAGNOSIS — N189 Chronic kidney disease, unspecified: Secondary | ICD-10-CM | POA: Diagnosis not present

## 2020-03-07 DIAGNOSIS — J449 Chronic obstructive pulmonary disease, unspecified: Secondary | ICD-10-CM | POA: Diagnosis not present

## 2020-03-07 DIAGNOSIS — I129 Hypertensive chronic kidney disease with stage 1 through stage 4 chronic kidney disease, or unspecified chronic kidney disease: Secondary | ICD-10-CM | POA: Diagnosis not present

## 2020-03-09 DIAGNOSIS — E039 Hypothyroidism, unspecified: Secondary | ICD-10-CM | POA: Diagnosis not present

## 2020-03-09 DIAGNOSIS — M5442 Lumbago with sciatica, left side: Secondary | ICD-10-CM | POA: Diagnosis not present

## 2020-03-09 DIAGNOSIS — M1990 Unspecified osteoarthritis, unspecified site: Secondary | ICD-10-CM | POA: Diagnosis not present

## 2020-03-09 DIAGNOSIS — J449 Chronic obstructive pulmonary disease, unspecified: Secondary | ICD-10-CM | POA: Diagnosis not present

## 2020-03-09 DIAGNOSIS — M5186 Other intervertebral disc disorders, lumbar region: Secondary | ICD-10-CM | POA: Diagnosis not present

## 2020-03-09 DIAGNOSIS — F419 Anxiety disorder, unspecified: Secondary | ICD-10-CM | POA: Diagnosis not present

## 2020-03-09 DIAGNOSIS — I129 Hypertensive chronic kidney disease with stage 1 through stage 4 chronic kidney disease, or unspecified chronic kidney disease: Secondary | ICD-10-CM | POA: Diagnosis not present

## 2020-03-09 DIAGNOSIS — I700 Atherosclerosis of aorta: Secondary | ICD-10-CM | POA: Diagnosis not present

## 2020-03-09 DIAGNOSIS — N189 Chronic kidney disease, unspecified: Secondary | ICD-10-CM | POA: Diagnosis not present

## 2020-03-15 DIAGNOSIS — M5442 Lumbago with sciatica, left side: Secondary | ICD-10-CM | POA: Diagnosis not present

## 2020-03-15 DIAGNOSIS — J449 Chronic obstructive pulmonary disease, unspecified: Secondary | ICD-10-CM | POA: Diagnosis not present

## 2020-03-15 DIAGNOSIS — E039 Hypothyroidism, unspecified: Secondary | ICD-10-CM | POA: Diagnosis not present

## 2020-03-15 DIAGNOSIS — M1990 Unspecified osteoarthritis, unspecified site: Secondary | ICD-10-CM | POA: Diagnosis not present

## 2020-03-15 DIAGNOSIS — M5186 Other intervertebral disc disorders, lumbar region: Secondary | ICD-10-CM | POA: Diagnosis not present

## 2020-03-15 DIAGNOSIS — I700 Atherosclerosis of aorta: Secondary | ICD-10-CM | POA: Diagnosis not present

## 2020-03-15 DIAGNOSIS — I129 Hypertensive chronic kidney disease with stage 1 through stage 4 chronic kidney disease, or unspecified chronic kidney disease: Secondary | ICD-10-CM | POA: Diagnosis not present

## 2020-03-15 DIAGNOSIS — N189 Chronic kidney disease, unspecified: Secondary | ICD-10-CM | POA: Diagnosis not present

## 2020-03-15 DIAGNOSIS — F419 Anxiety disorder, unspecified: Secondary | ICD-10-CM | POA: Diagnosis not present

## 2020-03-17 DIAGNOSIS — N189 Chronic kidney disease, unspecified: Secondary | ICD-10-CM | POA: Diagnosis not present

## 2020-03-17 DIAGNOSIS — E039 Hypothyroidism, unspecified: Secondary | ICD-10-CM | POA: Diagnosis not present

## 2020-03-17 DIAGNOSIS — M1990 Unspecified osteoarthritis, unspecified site: Secondary | ICD-10-CM | POA: Diagnosis not present

## 2020-03-17 DIAGNOSIS — M5442 Lumbago with sciatica, left side: Secondary | ICD-10-CM | POA: Diagnosis not present

## 2020-03-17 DIAGNOSIS — I129 Hypertensive chronic kidney disease with stage 1 through stage 4 chronic kidney disease, or unspecified chronic kidney disease: Secondary | ICD-10-CM | POA: Diagnosis not present

## 2020-03-17 DIAGNOSIS — J449 Chronic obstructive pulmonary disease, unspecified: Secondary | ICD-10-CM | POA: Diagnosis not present

## 2020-03-17 DIAGNOSIS — I700 Atherosclerosis of aorta: Secondary | ICD-10-CM | POA: Diagnosis not present

## 2020-03-17 DIAGNOSIS — M5186 Other intervertebral disc disorders, lumbar region: Secondary | ICD-10-CM | POA: Diagnosis not present

## 2020-03-17 DIAGNOSIS — F419 Anxiety disorder, unspecified: Secondary | ICD-10-CM | POA: Diagnosis not present

## 2020-03-20 ENCOUNTER — Encounter: Payer: Self-pay | Admitting: Adult Health

## 2020-03-21 ENCOUNTER — Other Ambulatory Visit: Payer: Self-pay | Admitting: Adult Health

## 2020-03-21 DIAGNOSIS — M25551 Pain in right hip: Secondary | ICD-10-CM

## 2020-03-21 DIAGNOSIS — M4807 Spinal stenosis, lumbosacral region: Secondary | ICD-10-CM

## 2020-03-22 DIAGNOSIS — I129 Hypertensive chronic kidney disease with stage 1 through stage 4 chronic kidney disease, or unspecified chronic kidney disease: Secondary | ICD-10-CM | POA: Diagnosis not present

## 2020-03-22 DIAGNOSIS — E039 Hypothyroidism, unspecified: Secondary | ICD-10-CM | POA: Diagnosis not present

## 2020-03-22 DIAGNOSIS — M1990 Unspecified osteoarthritis, unspecified site: Secondary | ICD-10-CM | POA: Diagnosis not present

## 2020-03-22 DIAGNOSIS — M5186 Other intervertebral disc disorders, lumbar region: Secondary | ICD-10-CM | POA: Diagnosis not present

## 2020-03-22 DIAGNOSIS — F419 Anxiety disorder, unspecified: Secondary | ICD-10-CM | POA: Diagnosis not present

## 2020-03-22 DIAGNOSIS — J449 Chronic obstructive pulmonary disease, unspecified: Secondary | ICD-10-CM | POA: Diagnosis not present

## 2020-03-22 DIAGNOSIS — N189 Chronic kidney disease, unspecified: Secondary | ICD-10-CM | POA: Diagnosis not present

## 2020-03-22 DIAGNOSIS — I700 Atherosclerosis of aorta: Secondary | ICD-10-CM | POA: Diagnosis not present

## 2020-03-22 DIAGNOSIS — M5442 Lumbago with sciatica, left side: Secondary | ICD-10-CM | POA: Diagnosis not present

## 2020-03-27 ENCOUNTER — Encounter: Payer: Self-pay | Admitting: Adult Health

## 2020-03-27 ENCOUNTER — Telehealth: Payer: Self-pay | Admitting: Nurse Practitioner

## 2020-03-27 ENCOUNTER — Other Ambulatory Visit: Payer: Self-pay

## 2020-03-27 ENCOUNTER — Emergency Department (HOSPITAL_COMMUNITY): Payer: Medicare PPO

## 2020-03-27 ENCOUNTER — Emergency Department (HOSPITAL_COMMUNITY)
Admission: EM | Admit: 2020-03-27 | Discharge: 2020-03-27 | Disposition: A | Discharge status: Home / Self Care | Payer: Medicare PPO | Attending: Emergency Medicine | Admitting: Emergency Medicine

## 2020-03-27 DIAGNOSIS — Z20822 Contact with and (suspected) exposure to covid-19: Secondary | ICD-10-CM | POA: Insufficient documentation

## 2020-03-27 DIAGNOSIS — R05 Cough: Secondary | ICD-10-CM | POA: Insufficient documentation

## 2020-03-27 DIAGNOSIS — E039 Hypothyroidism, unspecified: Secondary | ICD-10-CM | POA: Insufficient documentation

## 2020-03-27 DIAGNOSIS — J449 Chronic obstructive pulmonary disease, unspecified: Secondary | ICD-10-CM | POA: Diagnosis not present

## 2020-03-27 DIAGNOSIS — N183 Chronic kidney disease, stage 3 unspecified: Secondary | ICD-10-CM | POA: Diagnosis not present

## 2020-03-27 DIAGNOSIS — R42 Dizziness and giddiness: Secondary | ICD-10-CM | POA: Diagnosis not present

## 2020-03-27 DIAGNOSIS — R0602 Shortness of breath: Secondary | ICD-10-CM | POA: Diagnosis not present

## 2020-03-27 DIAGNOSIS — R0781 Pleurodynia: Secondary | ICD-10-CM | POA: Diagnosis not present

## 2020-03-27 DIAGNOSIS — J90 Pleural effusion, not elsewhere classified: Secondary | ICD-10-CM | POA: Diagnosis not present

## 2020-03-27 DIAGNOSIS — R52 Pain, unspecified: Secondary | ICD-10-CM | POA: Diagnosis not present

## 2020-03-27 DIAGNOSIS — Z87891 Personal history of nicotine dependence: Secondary | ICD-10-CM | POA: Insufficient documentation

## 2020-03-27 DIAGNOSIS — I499 Cardiac arrhythmia, unspecified: Secondary | ICD-10-CM | POA: Diagnosis not present

## 2020-03-27 DIAGNOSIS — I129 Hypertensive chronic kidney disease with stage 1 through stage 4 chronic kidney disease, or unspecified chronic kidney disease: Secondary | ICD-10-CM | POA: Diagnosis not present

## 2020-03-27 DIAGNOSIS — R064 Hyperventilation: Secondary | ICD-10-CM | POA: Diagnosis not present

## 2020-03-27 LAB — COMPREHENSIVE METABOLIC PANEL
ALT: 19 U/L (ref 0–44)
AST: 23 U/L (ref 15–41)
Albumin: 3.6 g/dL (ref 3.5–5.0)
Alkaline Phosphatase: 74 U/L (ref 38–126)
Anion gap: 9 (ref 5–15)
BUN: 16 mg/dL (ref 8–23)
CO2: 26 mmol/L (ref 22–32)
Calcium: 9.1 mg/dL (ref 8.9–10.3)
Chloride: 103 mmol/L (ref 98–111)
Creatinine, Ser: 1 mg/dL (ref 0.44–1.00)
GFR calc Af Amer: 57 mL/min — ABNORMAL LOW (ref >60)
GFR calc non Af Amer: 49 mL/min — ABNORMAL LOW (ref >60)
Glucose, Bld: 167 mg/dL — ABNORMAL HIGH (ref 70–99)
Potassium: 3.8 mmol/L (ref 3.5–5.1)
Sodium: 138 mmol/L (ref 135–145)
Total Bilirubin: 0.9 mg/dL (ref 0.3–1.2)
Total Protein: 5.9 g/dL — ABNORMAL LOW (ref 6.5–8.1)

## 2020-03-27 LAB — CBC WITH DIFFERENTIAL/PLATELET
Abs Immature Granulocytes: 0.02 10*3/uL (ref 0.00–0.07)
Basophils Absolute: 0 10*3/uL (ref 0.0–0.1)
Basophils Relative: 1 %
Eosinophils Absolute: 0.1 10*3/uL (ref 0.0–0.5)
Eosinophils Relative: 1 %
HCT: 42.4 % (ref 36.0–46.0)
Hemoglobin: 13.7 g/dL (ref 12.0–15.0)
Immature Granulocytes: 0 %
Lymphocytes Relative: 14 %
Lymphs Abs: 0.9 10*3/uL (ref 0.7–4.0)
MCH: 30.2 pg (ref 26.0–34.0)
MCHC: 32.3 g/dL (ref 30.0–36.0)
MCV: 93.4 fL (ref 80.0–100.0)
Monocytes Absolute: 0.9 10*3/uL (ref 0.1–1.0)
Monocytes Relative: 14 %
Neutro Abs: 4.5 10*3/uL (ref 1.7–7.7)
Neutrophils Relative %: 70 %
Platelets: 192 10*3/uL (ref 150–400)
RBC: 4.54 MIL/uL (ref 3.87–5.11)
RDW: 15 % (ref 11.5–15.5)
WBC: 6.5 10*3/uL (ref 4.0–10.5)
nRBC: 0 % (ref 0.0–0.2)

## 2020-03-27 LAB — SARS CORONAVIRUS 2 BY RT PCR (HOSPITAL ORDER, PERFORMED IN ~~LOC~~ HOSPITAL LAB): SARS Coronavirus 2: NEGATIVE

## 2020-03-27 LAB — TROPONIN I (HIGH SENSITIVITY): Troponin I (High Sensitivity): 22 ng/L — ABNORMAL HIGH (ref <18)

## 2020-03-27 LAB — BRAIN NATRIURETIC PEPTIDE: B Natriuretic Peptide: 205.3 pg/mL — ABNORMAL HIGH (ref 0.0–100.0)

## 2020-03-27 MED ORDER — ALBUTEROL SULFATE (2.5 MG/3ML) 0.083% IN NEBU: 5.0000 mg | INHALATION_SOLUTION | Freq: Once | RESPIRATORY_TRACT | Status: DC

## 2020-03-27 NOTE — Telephone Encounter (Signed)
Patient's daughter states the patient at the ED and is waiting in the lobby, because there are no rooms. She states they told her they will have a cardiologist on stand by. She would like to know if there is anyone that can see her in the hospital.

## 2020-03-27 NOTE — ED Notes (Signed)
Pt said she took bp med

## 2020-03-27 NOTE — ED Provider Notes (Signed)
[facility] EMERGENCY DEPARTMENT Provider Note   CSN: [id number] Arrival date & time: 03/27/20  0920     History Chief Complaint  Patient presents with  . Shortness of Breath    [name] is a 86 y.o. female.  She has a history of COPD, hypothyroidism , HTN and presented with sudden onset shortness of breath this morning. She felt lightheaded, but no associated chest pain, nausea, vomiting. Not worse with exertion. Nebulizer treatment did not help. She endorses cough and post nasal drip as well. Denies melena. Denies sick contacts. Finished Moderna series in [place].    Shortness of Breath Severity:  Moderate Duration:  12 hours Timing:  Constant Progression:  Improving Relieved by:  Nothing Worsened by:  Nothing Ineffective treatments:  Inhaler Associated symptoms: cough   Associated symptoms: no abdominal pain, no chest pain, no ear pain, no fever, no headaches, no hemoptysis, no rash, no sore throat, no vomiting and no wheezing        Past Medical History:  Diagnosis Date  . Anxiety   . Aortic arch atherosclerosis ([place]) 06/24/2014  . Atherosclerotic ulcer of aorta ([place]) 06/24/2014  . Carotid artery occlusion   . Chronic kidney disease   . COPD (chronic obstructive pulmonary disease) ([place])   . [place] DISEASE, LUMBAR 12/16/2008  . DIVERTICULOSIS, COLON 09/30/2008  . DYSPNEA 07/13/2008  . Graves disease   . History of embolic stroke 10/14/8117   Left brain  . HYPERLIPIDEMIA 03/06/2007  . HYPERTENSION 03/06/2007  . HYPOTHYROIDISM 10/13/2007  . OSTEOARTHRITIS 03/06/2007  . Personal history of colonic polyps 09/30/2008  . Stroke ([place])    06/2014           . TOBACCO USE, QUIT 04/12/2009  . WEAKNESS 11/09/2007    Patient Active Problem List   Diagnosis Date Noted  . D-dimer, elevated 01/14/2020  . Elevated brain natriuretic peptide (BNP) level 01/14/2020  . Advice given about COVID-19 virus infection 07/21/2019  . Current chronic use of systemic  steroids 07/13/2019  . Medication management 04/02/2018  . COPD exacerbation ([place]) 03/31/2018  . Hypokalemia 03/30/2018  . COPD with acute exacerbation ([place]) 03/02/2018  . SOB (shortness of breath) 01/12/2018  . CKD (chronic kidney disease), stage III 01/12/2018  . New onset left bundle branch block (LBBB) 01/12/2018  . COPD GOLD I D 01/12/2018  . Left buttock pain 09/13/2017  . Symptomatic anemia   . AVM (arteriovenous malformation) of duodenum, acquired with hemorrhage   . Acute GI bleeding 10/09/2016  . Other emphysema ([place]) 05/10/2016  . Recurrent laryngeal neuropathy 02/01/2016  . Nausea with vomiting 12/11/2015  . Left carotid stenosis 11/21/2015  . Asymptomatic carotid artery stenosis 11/13/2015  . Impaired glucose tolerance 10/27/2015  . Solitary pulmonary nodule 10/27/2015  . Thyroid nodule 10/27/2015  . Syncope 10/04/2015  . Bradycardia with less than 60 beats per minute 10/04/2015  . History of embolic stroke 14/78/2956  . Paresthesia of both feet 07/06/2014  . Aortic arch atherosclerosis ([place]) 06/24/2014  . Atherosclerotic ulcer of aorta ([place]) 06/24/2014  . Stroke ([place]) 06/22/2014  . Bilateral carotid artery disease ([place]) 05/18/2014  . TOBACCO USE, QUIT 04/12/2009  . [place] DISEASE, LUMBAR 12/16/2008  . DIVERTICULOSIS, COLON 09/30/2008  . Personal history of colonic polyps 09/30/2008  . DYSPNEA 07/13/2008  . Weakness 11/09/2007  . Hypothyroidism 10/13/2007  . Hyperlipidemia 03/06/2007  . Essential hypertension 03/06/2007  . Osteoarthritis 03/06/2007    Past Surgical History:  Procedure Laterality Date  . CARDIAC CATHETERIZATION    .  CATARACT EXTRACTION Bilateral   . CHOLECYSTECTOMY    . ENDARTERECTOMY Left 11/13/2015   Procedure: LEFT CAROTID ARTERY ENDARTERECTOMY;  Surgeon: Conrad Little [place], MD;  Location: [facility];  Service: Vascular;  Laterality: Left;  . ESOPHAGOGASTRODUODENOSCOPY (EGD) WITH PROPOFOL N/A 10/11/2016   Procedure: ESOPHAGOGASTRODUODENOSCOPY (EGD) WITH  PROPOFOL;  Surgeon: Mauri Pole, MD;  [facility];  Service: Endoscopy;  Laterality: N/A;  . KNEE SURGERY    . PATCH ANGIOPLASTY Left 11/13/2015   Procedure: WITH 1CM X 6CM  XENOSURE BIOLOGIC PATCH ANGIOPLASTY;  Surgeon: Conrad Winona, MD;  Location: [facility];  Service: Vascular;  Laterality: Left;  . TEE WITHOUT CARDIOVERSION N/A 06/24/2014   Procedure: TRANSESOPHAGEAL ECHOCARDIOGRAM (TEE);  Surgeon: Sanda Klein, MD;  Location: [facility] ENDOSCOPY;  Service: Cardiovascular;  Laterality: N/A;     OB History   No obstetric history on file.     Family History  Problem Relation Age of Onset  . Stomach cancer Maternal Grandmother   . Colon cancer Neg Hx     Social History   Tobacco Use  . Smoking status: Former Smoker    Packs/day: 1.50    Years: 29.00    Pack years: 43.50    Types: Cigarettes    Start date: 07/08/1949    Quit date: 07/08/1978    Years since quitting: 41.7  . Smokeless tobacco: Never Used  Vaping Use  . Vaping Use: Never used  Substance Use Topics  . Alcohol use: Yes    Alcohol/week: 1.0 standard drink    Types: 1 Glasses of wine per week    Comment: "red wine"- some nights  . Drug use: No    Home Medications Prior to Admission medications   Medication Sig Start Date End Date Taking? Authorizing Provider  albuterol (PROAIR HFA) 108 (90 Base) MCG/ACT inhaler Inhale 2 puffs into the lungs every 6 (six) hours as needed for wheezing or shortness of breath. 11/13/17   Volanda Napoleon, PA-C  amLODipine (NORVASC) 10 MG tablet Take 0.5 tablets (5 mg total) by mouth daily. 02/14/20   Burtis Junes, NP  atorvastatin (LIPITOR) 80 MG tablet TAKE 1 TABLET BY MOUTH EVERY DAY AT 6PM Patient taking differently: Take 80 mg by mouth every evening.  01/06/20   Nafziger, Tommi Rumps, NP  hydrALAZINE (APRESOLINE) 25 MG tablet TAKE 3 TABLETS BY MOUTH 3 TIMES A DAY Patient taking differently: Take 75 mg by mouth 3 (three) times daily.  08/30/19   Burtis Junes, NP   ipratropium-albuterol (DUONEB) 0.5-2.5 (3) MG/3ML SOLN Take 3 mLs by nebulization every 6 (six) hours as needed (copd). 01/16/20   Danford, Suann Larry, MD  latanoprost (XALATAN) 0.005 % ophthalmic solution Place 1 drop into both eyes at bedtime.  04/02/19   [provider]  levothyroxine (SYNTHROID) 75 MCG tablet TAKE 1 TABLET BY MOUTH EVERY DAY BEFORE BREAKFAST Patient taking differently: Take 75 mcg by mouth daily before breakfast.  07/16/19   Nafziger, Tommi Rumps, NP  predniSONE (DELTASONE) 10 MG tablet Take 40 mg (4 tabs) for 4 days then take 30 mg (3 tabs) for 4 days then take 20 mg (2 tabs) for 4 days then take 10 mg (1 tab) for 4 days then reduce to previous 2.5 mg dose 01/16/20   Danford, Suann Larry, MD  Spacer/Aero-Holding Chambers (AEROCHAMBER MV) inhaler Use as instructed Patient taking differently: 1 each by Other route as directed.  11/26/17   Magdalen Spatz, NP  TRELEGY ELLIPTA 100-62.5-25 MCG/INH AEPB TAKE 1  PUFF BY MOUTH EVERY DAY Patient taking differently: Inhale 1 puff into the lungs daily.  12/28/19   Marshell Garfinkel, MD    Allergies    Catapres [clonidine hcl], Lisinopril, and Labetalol hcl  Review of Systems   Review of Systems  Constitutional: Negative for chills and fever.  HENT: Negative for ear pain and sore throat.   Eyes: Negative for pain and visual disturbance.  Respiratory: Positive for cough and shortness of breath. Negative for hemoptysis and wheezing.   Cardiovascular: Negative for chest pain and palpitations.  Gastrointestinal: Negative for abdominal pain and vomiting.  Genitourinary: Negative for dysuria and hematuria.  Musculoskeletal: Negative for arthralgias and back pain.  Skin: Negative for color change and rash.  Neurological: Negative for seizures, syncope and headaches.  All other systems reviewed and are negative.   Physical Exam Updated Vital Signs BP (!) 166/91 (BP Location: Right Arm)   Pulse 68   Temp 98.1 F (36.7 C) (Oral)    Resp (!) 21   Ht 5\' 6"  (1.676 m)   Wt 59 kg   SpO2 94%   BMI 20.98 kg/m   Physical Exam Vitals and nursing note reviewed.  Constitutional:      General: She is not in acute distress.    Appearance: She is well-developed.  HENT:     Head: Normocephalic and atraumatic.  Eyes:     Conjunctiva/sclera: Conjunctivae normal.  Cardiovascular:     Rate and Rhythm: Normal rate and regular rhythm.     Heart sounds: Murmur (chronic) heard.   Pulmonary:     Effort: Pulmonary effort is normal. No respiratory distress.     Breath sounds: Normal breath sounds. No wheezing, rhonchi or rales.  Abdominal:     Palpations: Abdomen is soft.     Tenderness: There is no abdominal tenderness.  Musculoskeletal:     Cervical back: Neck supple.  Skin:    General: Skin is warm and dry.  Neurological:     General: No focal deficit present.     Mental Status: She is alert and oriented to person, place, and time.     ED Results / Procedures / Treatments   Labs (all labs ordered are listed, but only abnormal results are displayed) Labs Reviewed  COMPREHENSIVE METABOLIC PANEL - Abnormal; Notable for the following components:      Result Value   Glucose, Bld 167 (*)    Total Protein 5.9 (*)    GFR calc non Af Amer 49 (*)    GFR calc Af Amer 57 (*)    All other components within normal limits  BRAIN NATRIURETIC PEPTIDE - Abnormal; Notable for the following components:   B Natriuretic Peptide 205.3 (*)    All other components within normal limits  TROPONIN I (HIGH SENSITIVITY) - Abnormal; Notable for the following components:   Troponin I (High Sensitivity) 22 (*)    All other components within normal limits  SARS CORONAVIRUS 2 BY RT PCR [facility] ORDER, [place] LAB)  CBC WITH DIFFERENTIAL/PLATELET    EKG EKG Interpretation  Date/Time:  Monday March 27 2020 09:31:00 EDT Ventricular Rate:  70 PR Interval:  162 QRS Duration: 112 QT Interval:  420 QTC  Calculation: 453 R Axis:   -5 Text Interpretation: Sinus rhythm with Premature atrial complexes Left ventricular hypertrophy with repolarization abnormality ( Cornell product ) Anteroseptal infarct , age undetermined Abnormal ECG No significant change since last tracing Confirmed by Isla Pence [phone]) on 03/27/2020 9:03:44  PM   Radiology DG Chest 2 View  Result Date: 03/27/2020 CLINICAL DATA:  Shortness of breath. EXAM: CHEST - 2 VIEW COMPARISON:  01/14/2020 and chest CT 01/14/2020 FINDINGS: Coarse lung markings are similar to the previous examinations. Improved aeration at the left lung base compared to the most recent prior examination with persistent blunting in this area. Blunting at the left lung base is similar to the exam on 09/01/2019. No focal airspace disease or lung consolidation. Heart and mediastinum are within normal limits. IMPRESSION: 1. Chronic lung changes without acute findings. 2. Slightly improved aeration at the left lung base with persistent blunting at the left costophrenic angle. Findings could represent scarring or residual small left pleural effusion. Electronically Signed   By: Markus Daft M.D.   On: 03/27/2020 10:00    Procedures Procedures (including critical care time)  Medications Ordered in ED Medications  albuterol (PROVENTIL) (2.5 MG/3ML) 0.083% nebulizer solution 5 mg (has no administration in time range)    ED Course  I have reviewed the triage vital signs and the nursing notes.  Pertinent labs & imaging results that were available during my care of the patient were reviewed by me and considered in my medical decision making (see chart for details).    MDM Rules/Calculators/A&P                         Based on the above exam and workup, not consistent with COVID, symptomatic anemia, PNA, COPD exacerbation. Wells score 0, not hypoxic/tachypneic/tachycardic, so no concern for PE.   EKG with chronic LVH, but no acute signs of ischemia or arrhythmia.  Trop 22 drawn 13 hours after onset of symptoms, and patient never had chest pain. Trop 22 more likely demand ischemia related to intermittent hypertension throughout the day. No concern for ACS at this time.   Patient offered inhaler, but declined. No emergent or reversible cause of shortness of breath at this time. Does not meet criteria for admission. Feel she is stable to follow up with her PCP as an outpatient if symptoms continue. She verbalized understanding and agreement, and will spend the night at her daughter's house since the patient lives alone.    Final Clinical Impression(s) / ED Diagnoses Final diagnoses:  Shortness of breath    Rx / DC Orders ED Discharge Orders    None       Asencion Noble, MD 03/27/20 0076    Isla Pence, MD 03/28/20 [phone]

## 2020-03-27 NOTE — Telephone Encounter (Signed)
Calling pt's daughter back per phone call.  [facility] sent [facility] due to stated EKG looks like Heartblock markers. Pt feels like she is going to pass out, having back and hip pain, is scheduled for MRI in the beginning of Oct.  Stated if would have known would have sent to [facility] on [address].  Pt cannot leave would be considered AMA. Daughter stated was [facility] see [facility] be [facility].  Unfortunately this is how the hospital wait times are with the Pandemic. Will send to [place] to [place].

## 2020-03-27 NOTE — ED Triage Notes (Signed)
Pt arrives via EMS from home with SOB that began this morning, hx of COPD. Denies recent fever. VSS. 97% RA. ALert, oriented x4. Reports left lower back pain and sciatica. Denies urinary symptoms. 20g LAC the EKG showed LVH with elevation in V1,2,3.

## 2020-03-27 NOTE — ED Notes (Signed)
Patient verbalizes understanding of discharge instructions. Opportunity for questioning and answers were provided. Armband removed by staff, pt discharged from ED stable & ambulatory  

## 2020-03-27 NOTE — ED Notes (Signed)
BP going up on the waiting room pt here for SOB, hx of HTN, pt to go to the back ASAP. Will continue to monitor.

## 2020-03-28 ENCOUNTER — Other Ambulatory Visit: Payer: Self-pay

## 2020-03-28 ENCOUNTER — Ambulatory Visit (INDEPENDENT_AMBULATORY_CARE_PROVIDER_SITE_OTHER): Payer: Medicare PPO | Admitting: *Deleted

## 2020-03-28 DIAGNOSIS — E538 Deficiency of other specified B group vitamins: Secondary | ICD-10-CM | POA: Diagnosis not present

## 2020-03-28 MED ORDER — CYANOCOBALAMIN 1000 MCG/ML IJ SOLN
1000.0000 ug | Freq: Once | INTRAMUSCULAR | Status: AC
  Administered 2020-03-28: 1000 ug via INTRAMUSCULAR

## 2020-03-28 NOTE — Progress Notes (Signed)
Per orders of Cory Nafziger, NP, injection of Cyanocobalamin 1000mcg given by Geralyn Figiel A. Patient tolerated injection well. 

## 2020-03-29 ENCOUNTER — Encounter: Payer: Self-pay | Admitting: Adult Health

## 2020-03-30 ENCOUNTER — Other Ambulatory Visit: Payer: Self-pay | Admitting: Pulmonary Disease

## 2020-03-31 DIAGNOSIS — M5442 Lumbago with sciatica, left side: Secondary | ICD-10-CM | POA: Diagnosis not present

## 2020-03-31 DIAGNOSIS — M1990 Unspecified osteoarthritis, unspecified site: Secondary | ICD-10-CM | POA: Diagnosis not present

## 2020-03-31 DIAGNOSIS — J449 Chronic obstructive pulmonary disease, unspecified: Secondary | ICD-10-CM | POA: Diagnosis not present

## 2020-03-31 DIAGNOSIS — I129 Hypertensive chronic kidney disease with stage 1 through stage 4 chronic kidney disease, or unspecified chronic kidney disease: Secondary | ICD-10-CM | POA: Diagnosis not present

## 2020-03-31 DIAGNOSIS — F419 Anxiety disorder, unspecified: Secondary | ICD-10-CM | POA: Diagnosis not present

## 2020-03-31 DIAGNOSIS — N189 Chronic kidney disease, unspecified: Secondary | ICD-10-CM | POA: Diagnosis not present

## 2020-03-31 DIAGNOSIS — M5186 Other intervertebral disc disorders, lumbar region: Secondary | ICD-10-CM | POA: Diagnosis not present

## 2020-03-31 DIAGNOSIS — I700 Atherosclerosis of aorta: Secondary | ICD-10-CM | POA: Diagnosis not present

## 2020-03-31 DIAGNOSIS — E039 Hypothyroidism, unspecified: Secondary | ICD-10-CM | POA: Diagnosis not present

## 2020-04-03 DIAGNOSIS — H353113 Nonexudative age-related macular degeneration, right eye, advanced atrophic without subfoveal involvement: Secondary | ICD-10-CM | POA: Diagnosis not present

## 2020-04-03 DIAGNOSIS — H353221 Exudative age-related macular degeneration, left eye, with active choroidal neovascularization: Secondary | ICD-10-CM | POA: Diagnosis not present

## 2020-04-03 DIAGNOSIS — H35033 Hypertensive retinopathy, bilateral: Secondary | ICD-10-CM | POA: Diagnosis not present

## 2020-04-04 ENCOUNTER — Telehealth: Payer: Self-pay | Admitting: Adult Health

## 2020-04-04 NOTE — Telephone Encounter (Signed)
Liji from [facility] stated this was her discharge week, but that patient was not feeling well and was awaiting on MRI for her hip, and asking to end therapy dates for one week to allow for this. Ok given.

## 2020-04-04 NOTE — Telephone Encounter (Signed)
Liji from [facility] [phone] need verbal orders for: Need to extend services for 1 more week until October 9th.

## 2020-04-09 ENCOUNTER — Other Ambulatory Visit: Payer: Medicare PPO

## 2020-04-09 ENCOUNTER — Inpatient Hospital Stay: Admission: RE | Admit: 2020-04-09 | Payer: Medicare PPO | Source: Ambulatory Visit

## 2020-04-10 DIAGNOSIS — M25562 Pain in left knee: Secondary | ICD-10-CM | POA: Insufficient documentation

## 2020-04-10 DIAGNOSIS — M1712 Unilateral primary osteoarthritis, left knee: Secondary | ICD-10-CM | POA: Diagnosis not present

## 2020-04-12 ENCOUNTER — Encounter: Payer: Self-pay | Admitting: Adult Health

## 2020-04-12 DIAGNOSIS — M159 Polyosteoarthritis, unspecified: Secondary | ICD-10-CM

## 2020-04-12 DIAGNOSIS — Z8673 Personal history of transient ischemic attack (TIA), and cerebral infarction without residual deficits: Secondary | ICD-10-CM

## 2020-04-12 DIAGNOSIS — I6523 Occlusion and stenosis of bilateral carotid arteries: Secondary | ICD-10-CM

## 2020-04-12 DIAGNOSIS — J441 Chronic obstructive pulmonary disease with (acute) exacerbation: Secondary | ICD-10-CM

## 2020-04-12 DIAGNOSIS — M5137 Other intervertebral disc degeneration, lumbosacral region: Secondary | ICD-10-CM

## 2020-04-12 DIAGNOSIS — I10 Essential (primary) hypertension: Secondary | ICD-10-CM

## 2020-04-12 DIAGNOSIS — R531 Weakness: Secondary | ICD-10-CM

## 2020-04-13 ENCOUNTER — Ambulatory Visit
Admission: RE | Admit: 2020-04-13 | Discharge: 2020-04-13 | Disposition: A | Discharge status: Home / Self Care | Payer: Medicare PPO | Source: Ambulatory Visit | Attending: Adult Health | Admitting: Adult Health

## 2020-04-13 DIAGNOSIS — M4807 Spinal stenosis, lumbosacral region: Secondary | ICD-10-CM

## 2020-04-13 DIAGNOSIS — M48061 Spinal stenosis, lumbar region without neurogenic claudication: Secondary | ICD-10-CM | POA: Diagnosis not present

## 2020-04-13 DIAGNOSIS — M160 Bilateral primary osteoarthritis of hip: Secondary | ICD-10-CM | POA: Diagnosis not present

## 2020-04-13 DIAGNOSIS — M25551 Pain in right hip: Secondary | ICD-10-CM

## 2020-04-13 DIAGNOSIS — D259 Leiomyoma of uterus, unspecified: Secondary | ICD-10-CM | POA: Diagnosis not present

## 2020-04-13 NOTE — Telephone Encounter (Signed)
Call made to pt dtr [facility]) but was unable to reach. LVM for Joann to call me back so I can get the following information.     1. Where would pt/pt dtr like to have the wheel chair dispensed from?   2. I will have the DME order for the stair lift printed for dtr to pick up.  3. What is the patients pain level on average?  4. What is the patients strength score (right and left)?

## 2020-04-13 NOTE — Telephone Encounter (Signed)
Order pended for you to sign. Dtr would like to pick up both prescription. Both will go to [facility] and they will dispense per [name] (pt dtr).   Not sure if your MA will hold onto the signed rx's or if they will need to go up front for pick up.   1. Dtr would like [facility] to dispense the transport chair.  2. Dtr would like to have a transport chair 3. Pt pain level is usually around 6/10 4. Upper and Lower Extremity Strength score 3/5

## 2020-04-14 ENCOUNTER — Telehealth: Payer: Self-pay | Admitting: Adult Health

## 2020-04-14 DIAGNOSIS — F419 Anxiety disorder, unspecified: Secondary | ICD-10-CM | POA: Diagnosis not present

## 2020-04-14 DIAGNOSIS — J449 Chronic obstructive pulmonary disease, unspecified: Secondary | ICD-10-CM | POA: Diagnosis not present

## 2020-04-14 DIAGNOSIS — E039 Hypothyroidism, unspecified: Secondary | ICD-10-CM | POA: Diagnosis not present

## 2020-04-14 DIAGNOSIS — N189 Chronic kidney disease, unspecified: Secondary | ICD-10-CM | POA: Diagnosis not present

## 2020-04-14 DIAGNOSIS — M25551 Pain in right hip: Secondary | ICD-10-CM

## 2020-04-14 DIAGNOSIS — I700 Atherosclerosis of aorta: Secondary | ICD-10-CM | POA: Diagnosis not present

## 2020-04-14 DIAGNOSIS — M1990 Unspecified osteoarthritis, unspecified site: Secondary | ICD-10-CM | POA: Diagnosis not present

## 2020-04-14 DIAGNOSIS — M5186 Other intervertebral disc disorders, lumbar region: Secondary | ICD-10-CM | POA: Diagnosis not present

## 2020-04-14 DIAGNOSIS — M4807 Spinal stenosis, lumbosacral region: Secondary | ICD-10-CM

## 2020-04-14 DIAGNOSIS — M25552 Pain in left hip: Secondary | ICD-10-CM

## 2020-04-14 DIAGNOSIS — I129 Hypertensive chronic kidney disease with stage 1 through stage 4 chronic kidney disease, or unspecified chronic kidney disease: Secondary | ICD-10-CM | POA: Diagnosis not present

## 2020-04-14 DIAGNOSIS — M5442 Lumbago with sciatica, left side: Secondary | ICD-10-CM | POA: Diagnosis not present

## 2020-04-14 NOTE — Telephone Encounter (Signed)
Updated patient on her MRI results of the lumbar spine and bilateral hips.  She is okay with seeing an orthopedist and neurosurgeon to help alleviate some of her discomfort

## 2020-04-18 ENCOUNTER — Telehealth: Payer: Self-pay

## 2020-04-18 NOTE — Telephone Encounter (Signed)
[name] (Daughter) picked up orders for stairlift & wheelchair.

## 2020-04-19 ENCOUNTER — Telehealth: Payer: Self-pay | Admitting: Pulmonary Disease

## 2020-04-19 NOTE — Telephone Encounter (Signed)
Attempted to call pt but unable to reach. Left message for her to return call. 

## 2020-04-20 ENCOUNTER — Other Ambulatory Visit: Payer: Self-pay | Admitting: Adult Health

## 2020-04-20 NOTE — Telephone Encounter (Signed)
Called and spoke to pt. Pt states since she was seen last in 08/28/19 by Dr. Vaughan Browner, she has titrated herself down to daily 2.5mg  of prednisone, from 10mg . Pt states she occasionally takes 5mg  when she feels it necessary, typically when the weather changes which she states is about 1-2 times per week. Pt is also taking the Duoneb routinely in the morning, taking trelegy daily, and states she only needs the albuterol hfa once or twice a week, pt was unsure if the albuterol use coincided with her 5mg  pred use. Pt states overall she feels she is doing well.   Pt questioning if she should stay on the 2.5mg  or wean off prior to her next OV, yearly OV in February.   Dr. Vaughan Browner, please advise. Thanks.

## 2020-04-21 NOTE — Telephone Encounter (Signed)
If she is doing well then we can attempt to wean off 2.5 mg prednisone before return clinic visit

## 2020-04-21 NOTE — Telephone Encounter (Signed)
ATC patient, left a vm letting her know it was ok to weak off the 2.5mg  of prednisone prior to her f/u visit with Dr. Vaughan Browner.  Advised to call with any questions.

## 2020-04-24 ENCOUNTER — Ambulatory Visit: Payer: Medicare PPO | Admitting: Orthopaedic Surgery

## 2020-04-24 ENCOUNTER — Encounter: Payer: Self-pay | Admitting: Orthopaedic Surgery

## 2020-04-24 VITALS — Ht 66.0 in | Wt 129.0 lb

## 2020-04-24 DIAGNOSIS — M1611 Unilateral primary osteoarthritis, right hip: Secondary | ICD-10-CM | POA: Diagnosis not present

## 2020-04-24 DIAGNOSIS — M48061 Spinal stenosis, lumbar region without neurogenic claudication: Secondary | ICD-10-CM | POA: Diagnosis not present

## 2020-04-24 DIAGNOSIS — M25559 Pain in unspecified hip: Secondary | ICD-10-CM

## 2020-04-24 DIAGNOSIS — M160 Bilateral primary osteoarthritis of hip: Secondary | ICD-10-CM

## 2020-04-24 DIAGNOSIS — M1612 Unilateral primary osteoarthritis, left hip: Secondary | ICD-10-CM | POA: Diagnosis not present

## 2020-04-24 NOTE — Progress Notes (Signed)
Office Visit Note   Patient: [name]           Date of Birth: 1924/12/11           MRN: [id number] Visit Date: 04/24/2020              Requested by: [name], NP [place],  [place] [id number] PCP: [name], NP   Assessment & Plan: Visit Diagnoses:  1. Unilateral primary osteoarthritis, left hip   2. Unilateral primary osteoarthritis, right hip   3. Hip pain   4. Spinal stenosis of lumbar region without neurogenic claudication     Plan: I gave the patient and her daughter her reassurance that this is not a hip issue and she does not need hip replacement surgery.  I feel this is more of an issue and sciatic issue and she would best benefit from either a left-sided epidural steroid injection versus an injection around the ischium.  I have offered this through our physiatrist Dr. Ernestina Patches but they would like to follow-up with a neurologist first.  They will let us know if they would like anything else scheduled.  All questions and concerns were answered and addressed.  Follow-Up Instructions: Return if symptoms worsen or fail to improve.   Orders:  No orders of the defined types were placed in this encounter.  No orders of the defined types were placed in this encounter.     Procedures: No procedures performed   Clinical Data: No additional findings.   Subjective: Chief Complaint  Patient presents with  . Right Hip - Pain  . Left Hip - Pain  The patient is seen today with bilateral hip pain for the last 2 months but she points to the sciatic region and ischium on the left side as a source of her pain.  She denies any groin pain bilaterally.  She does have MRIs that accompany her showing mild to moderate arthritis in both of her hips.  She denies any significant low back pain.  She ambulates with a cane.  She is very active.  This is only been going on a short amount of time.  Apparently she is seeing a neurologist next week.  HPI  Review  of Systems She currently denies any headache, chest pain, shortness of breath, fever, chills, nausea, vomiting  Objective: Vital Signs: Ht 5\' 6"  (1.676 m)   Wt 129 lb (58.5 kg)   BMI 20.82 kg/m   Physical Exam She is alert and oriented x3 and in no acute distress Ortho Exam  examination of both hips show that the move smoothly and fluidly with no pain in the groin at all.  There is no pain on extreme rotation of either hip.  Her pain is only in the sciatic region to the left side and in the ischium of the left side. Specialty Comments:  No specialty comments available.  Imaging: No results found. I was able to independently review the MRI of both hips as well as the plain film of both hips.  I would say she only has mild arthritis in her left hip and mild to moderate arthritis in the right hip.  Clinically though this does not correlate with any worrisome features.  Her MRI of the lumbar spine does show quite severe stenosis at L4-L5.  PMFS History: Patient Active Problem List   Diagnosis Date Noted  . D-dimer, elevated 01/14/2020  . Elevated brain natriuretic peptide (BNP) level 01/14/2020  . Advice  given about COVID-19 virus infection 07/21/2019  . Current chronic use of systemic steroids 07/13/2019  . Medication management 04/02/2018  . COPD exacerbation ([place]) 03/31/2018  . Hypokalemia 03/30/2018  . COPD with acute exacerbation ([place]) 03/02/2018  . SOB (shortness of breath) 01/12/2018  . CKD (chronic kidney disease), stage III 01/12/2018  . New onset left bundle branch block (LBBB) 01/12/2018  . COPD GOLD I D 01/12/2018  . Left buttock pain 09/13/2017  . Symptomatic anemia   . AVM (arteriovenous malformation) of duodenum, acquired with hemorrhage   . Acute GI bleeding 10/09/2016  . Other emphysema ([place]) 05/10/2016  . Recurrent laryngeal neuropathy 02/01/2016  . Nausea with vomiting 12/11/2015  . Left carotid stenosis 11/21/2015  . Asymptomatic carotid artery stenosis  11/13/2015  . Impaired glucose tolerance 10/27/2015  . Solitary pulmonary nodule 10/27/2015  . Thyroid nodule 10/27/2015  . Syncope 10/04/2015  . Bradycardia with less than 60 beats per minute 10/04/2015  . History of embolic stroke 98/33/8250  . Paresthesia of both feet 07/06/2014  . Aortic arch atherosclerosis ([place]) 06/24/2014  . Atherosclerotic ulcer of aorta ([place]) 06/24/2014  . Stroke ([place]) 06/22/2014  . Bilateral carotid artery disease ([place]) 05/18/2014  . TOBACCO USE, QUIT 04/12/2009  . [place] DISEASE, LUMBAR 12/16/2008  . DIVERTICULOSIS, COLON 09/30/2008  . Personal history of colonic polyps 09/30/2008  . DYSPNEA 07/13/2008  . Weakness 11/09/2007  . Hypothyroidism 10/13/2007  . Hyperlipidemia 03/06/2007  . Essential hypertension 03/06/2007  . Osteoarthritis 03/06/2007   Past Medical History:  Diagnosis Date  . Anxiety   . Aortic arch atherosclerosis ([place]) 06/24/2014  . Atherosclerotic ulcer of aorta ([place]) 06/24/2014  . Carotid artery occlusion   . Chronic kidney disease   . COPD (chronic obstructive pulmonary disease) ([place])   . [place] DISEASE, LUMBAR 12/16/2008  . DIVERTICULOSIS, COLON 09/30/2008  . DYSPNEA 07/13/2008  . Graves disease   . History of embolic stroke 5/39/7673   Left brain  . HYPERLIPIDEMIA 03/06/2007  . HYPERTENSION 03/06/2007  . HYPOTHYROIDISM 10/13/2007  . OSTEOARTHRITIS 03/06/2007  . Personal history of colonic polyps 09/30/2008  . Stroke ([place])    06/2014           . TOBACCO USE, QUIT 04/12/2009  . WEAKNESS 11/09/2007    Family History  Problem Relation Age of Onset  . Stomach cancer Maternal Grandmother   . Colon cancer Neg Hx     Past Surgical History:  Procedure Laterality Date  . CARDIAC CATHETERIZATION    . CATARACT EXTRACTION Bilateral   . CHOLECYSTECTOMY    . ENDARTERECTOMY Left 11/13/2015   Procedure: LEFT CAROTID ARTERY ENDARTERECTOMY;  Surgeon: Conrad [place], MD;  Location: [facility];  Service: Vascular;  Laterality: Left;  .  ESOPHAGOGASTRODUODENOSCOPY (EGD) WITH PROPOFOL N/A 10/11/2016   Procedure: ESOPHAGOGASTRODUODENOSCOPY (EGD) WITH PROPOFOL;  Surgeon: Mauri Pole, MD;  Location: [facility];  Service: Endoscopy;  Laterality: N/A;  . KNEE SURGERY    . PATCH ANGIOPLASTY Left 11/13/2015   Procedure: WITH 1CM X 6CM  XENOSURE BIOLOGIC PATCH ANGIOPLASTY;  Surgeon: Conrad [place], MD;  Location: [facility];  Service: Vascular;  Laterality: Left;  . TEE WITHOUT CARDIOVERSION N/A 06/24/2014   Procedure: TRANSESOPHAGEAL ECHOCARDIOGRAM (TEE);  Surgeon: Sanda Klein, MD;  Location: [facility] ENDOSCOPY;  Service: Cardiovascular;  Laterality: N/A;   Social History   Occupational History  . Occupation: retired  Tobacco Use  . Smoking status: Former Smoker    Packs/day: 1.50    Years: 29.00    Pack  years: 43.50    Types: Cigarettes    Start date: 07/08/1949    Quit date: 07/08/1978    Years since quitting: 41.8  . Smokeless tobacco: Never Used  Vaping Use  . Vaping Use: Never used  Substance and Sexual Activity  . Alcohol use: Yes    Alcohol/week: 1.0 standard drink    Types: 1 Glasses of wine per week    Comment: "red wine"- some nights  . Drug use: No  . Sexual activity: Not on file

## 2020-04-25 ENCOUNTER — Telehealth: Payer: Self-pay | Admitting: Adult Health

## 2020-04-25 NOTE — Telephone Encounter (Signed)
Left message for patient to schedule Annual Wellness Visit.  Please schedule with Nurse Health Advisor Shannon Crews, RN at [facility]  

## 2020-04-27 NOTE — Telephone Encounter (Signed)
Called and spoke to pt. Informed her of the recs per Dr. Vaughan Browner. Pt verbalized understanding and denied any further questions or concerns at this time.

## 2020-05-01 DIAGNOSIS — M48062 Spinal stenosis, lumbar region with neurogenic claudication: Secondary | ICD-10-CM | POA: Diagnosis not present

## 2020-05-02 ENCOUNTER — Other Ambulatory Visit: Payer: Self-pay

## 2020-05-02 ENCOUNTER — Ambulatory Visit (INDEPENDENT_AMBULATORY_CARE_PROVIDER_SITE_OTHER): Payer: Medicare PPO

## 2020-05-02 ENCOUNTER — Other Ambulatory Visit: Payer: Self-pay | Admitting: *Deleted

## 2020-05-02 DIAGNOSIS — E538 Deficiency of other specified B group vitamins: Secondary | ICD-10-CM | POA: Diagnosis not present

## 2020-05-02 DIAGNOSIS — I6523 Occlusion and stenosis of bilateral carotid arteries: Secondary | ICD-10-CM

## 2020-05-02 MED ORDER — CYANOCOBALAMIN 1000 MCG/ML IJ SOLN
1000.0000 ug | Freq: Once | INTRAMUSCULAR | Status: AC
  Administered 2020-05-02: 1000 ug via INTRAMUSCULAR

## 2020-05-02 NOTE — Progress Notes (Addendum)
Per orders of [employer], injection of Cyanocobalamin 1,000 mcg/mL given by Wyvonne Lenz. Patient tolerated injection well.

## 2020-05-16 ENCOUNTER — Ambulatory Visit: Payer: Medicare PPO | Admitting: Physician Assistant

## 2020-05-16 ENCOUNTER — Ambulatory Visit (HOSPITAL_COMMUNITY)
Admission: RE | Admit: 2020-05-16 | Discharge: 2020-05-16 | Disposition: A | Discharge status: Home / Self Care | Payer: Medicare PPO | Source: Ambulatory Visit | Attending: Physician Assistant | Admitting: Physician Assistant

## 2020-05-16 ENCOUNTER — Other Ambulatory Visit: Payer: Self-pay

## 2020-05-16 ENCOUNTER — Encounter: Payer: Self-pay | Admitting: Physician Assistant

## 2020-05-16 VITALS — BP 172/81 | HR 79 | Temp 98.3°F | Resp 20 | Ht 66.0 in | Wt 128.1 lb

## 2020-05-16 DIAGNOSIS — H353113 Nonexudative age-related macular degeneration, right eye, advanced atrophic without subfoveal involvement: Secondary | ICD-10-CM | POA: Diagnosis not present

## 2020-05-16 DIAGNOSIS — H353221 Exudative age-related macular degeneration, left eye, with active choroidal neovascularization: Secondary | ICD-10-CM | POA: Diagnosis not present

## 2020-05-16 DIAGNOSIS — I6523 Occlusion and stenosis of bilateral carotid arteries: Secondary | ICD-10-CM | POA: Diagnosis not present

## 2020-05-16 NOTE — Progress Notes (Signed)
History of Present Illness:  Patient is a 86 y.o. year old female who presents for evaluation of carotid stenosis.  She is s/p ;eft symptomatic CEA known history of TIA: right hand weakness and paraesthesia. 11/13/19.  The patient denies symptoms of TIA, amaurosis, or stroke.  The patient is currently not on antiplatelet therapy.  She is staying active and has some newly diagnosed arthritis, otherwise she is doing well.  Past Medical History:  Diagnosis Date  . Anxiety   . Aortic arch atherosclerosis ([place]) 06/24/2014  . Atherosclerotic ulcer of aorta ([place]) 06/24/2014  . Carotid artery occlusion   . Chronic kidney disease   . COPD (chronic obstructive pulmonary disease) ([place])   . [place] DISEASE, LUMBAR 12/16/2008  . DIVERTICULOSIS, COLON 09/30/2008  . DYSPNEA 07/13/2008  . Graves disease   . History of embolic stroke 01/04/5283   Left brain  . HYPERLIPIDEMIA 03/06/2007  . HYPERTENSION 03/06/2007  . HYPOTHYROIDISM 10/13/2007  . OSTEOARTHRITIS 03/06/2007  . Personal history of colonic polyps 09/30/2008  . Stroke ([place])    06/2014           . TOBACCO USE, QUIT 04/12/2009  . WEAKNESS 11/09/2007    Past Surgical History:  Procedure Laterality Date  . CARDIAC CATHETERIZATION    . CATARACT EXTRACTION Bilateral   . CHOLECYSTECTOMY    . ENDARTERECTOMY Left 11/13/2015   Procedure: LEFT CAROTID ARTERY ENDARTERECTOMY;  Surgeon: Conrad [place], MD;  Location: [facility];  Service: Vascular;  Laterality: Left;  . ESOPHAGOGASTRODUODENOSCOPY (EGD) WITH PROPOFOL N/A 10/11/2016   Procedure: ESOPHAGOGASTRODUODENOSCOPY (EGD) WITH PROPOFOL;  Surgeon: Mauri Pole, MD;  [facility];  Service: Endoscopy;  Laterality: N/A;  . KNEE SURGERY    . PATCH ANGIOPLASTY Left 11/13/2015   Procedure: WITH 1CM X 6CM  XENOSURE BIOLOGIC PATCH ANGIOPLASTY;  Surgeon: Conrad [place], MD;  Location: [facility];  Service: Vascular;  Laterality: Left;  . TEE WITHOUT CARDIOVERSION N/A 06/24/2014   Procedure: TRANSESOPHAGEAL  ECHOCARDIOGRAM (TEE);  Surgeon: Sanda Klein, MD;  Location[facility]COPY;  Service: Cardiovascular;  Laterality: N/A;     Social History Social History   Tobacco Use  . Smoking status: Former Smoker    Packs/day: 1.50    Years: 29.00    Pack years: 43.50    Types: Cigarettes    Start date: 07/08/1949    Quit date: 07/08/1978    Years since quitting: 41.8  . Smokeless tobacco: Never Used  Vaping Use  . Vaping Use: Never used  Substance Use Topics  . Alcohol use: Yes    Alcohol/week: 1.0 standard drink    Types: 1 Glasses of wine per week    Comment: "red wine"- some nights  . Drug use: No    Family History Family History  Problem Relation Age of Onset  . Stomach cancer Maternal Grandmother   . Colon cancer Neg Hx     Allergies  Allergies  Allergen Reactions  . Catapres [Clonidine Hcl] Other (See Comments)    Made pt feel horrible, shaky, weak and nausea  . Lisinopril Anaphylaxis    Tongue swelling  . Catapres [Clonidine]   . Labetalol Hcl Other (See Comments)    Caused bradycardia and syncope     Current Outpatient Medications  Medication Sig Dispense Refill  . albuterol (PROAIR HFA) 108 (90 Base) MCG/ACT inhaler Inhale 2 puffs into the lungs every 6 (six) hours as needed for wheezing or shortness of breath. 8.5 Inhaler 5  .  amLODipine (NORVASC) 10 MG tablet Take 0.5 tablets (5 mg total) by mouth daily. 30 tablet 3  . atorvastatin (LIPITOR) 80 MG tablet TAKE 1 TABLET BY MOUTH EVERY DAY AT 6PM 90 tablet 1  . hydrALAZINE (APRESOLINE) 25 MG tablet TAKE 3 TABLETS BY MOUTH 3 TIMES A DAY (Patient taking differently: Take 75 mg by mouth 3 (three) times daily. ) 270 tablet 11  . ipratropium-albuterol (DUONEB) 0.5-2.5 (3) MG/3ML SOLN Take 3 mLs by nebulization every 6 (six) hours as needed (copd). 360 mL 11  . latanoprost (XALATAN) 0.005 % ophthalmic solution Place 1 drop into both eyes at bedtime.     [name] Kitchen levothyroxine (SYNTHROID) 75 MCG tablet TAKE 1 TABLET BY MOUTH EVERY  DAY BEFORE BREAKFAST (Patient taking differently: Take 75 mcg by mouth daily before breakfast. ) 90 tablet 3  . predniSONE (DELTASONE) 10 MG tablet Take 40 mg (4 tabs) for 4 days then take 30 mg (3 tabs) for 4 days then take 20 mg (2 tabs) for 4 days then take 10 mg (1 tab) for 4 days then reduce to previous 2.5 mg dose 40 tablet 0  . Spacer/Aero-Holding Chambers (AEROCHAMBER MV) inhaler Use as instructed (Patient taking differently: 1 each by Other route as directed. ) 1 each 0  . TRELEGY ELLIPTA 100-62.5-25 MCG/INH AEPB INHALE 1 PUFF BY MOUTH EVERY DAY 60 each 3   No current facility-administered medications for this visit.    ROS:   General:  No weight loss, Fever, chills  HEENT: No recent headaches, no nasal bleeding, no visual changes, no sore throat  Neurologic: No dizziness, blackouts, seizures. No recent symptoms of stroke or mini- stroke. No recent episodes of slurred speech, or temporary blindness.  Cardiac: No recent episodes of chest pain/pressure, no shortness of breath at rest.  No shortness of breath with exertion.  Denies history of atrial fibrillation or irregular heartbeat  Vascular: No history of rest pain in feet.  No history of claudication.  No history of non-healing ulcer, No history of DVT   Pulmonary: No home oxygen, no productive cough, no hemoptysis,  No asthma or wheezing  Musculoskeletal:  [x ] Arthritis, [ ]  Low back pain,  [ ]  Joint pain  Hematologic:No history of hypercoagulable state.  No history of easy bleeding.  No history of anemia  Gastrointestinal: No hematochezia or melena,  No gastroesophageal reflux, no trouble swallowing  Urinary: [ ]  chronic Kidney disease, [ ]  on HD - [ ]  MWF or [ ]  TTHS, [ ]  Burning with urination, [ ]  Frequent urination, [ ]  Difficulty urinating;   Skin: No rashes  Psychological: No history of anxiety,  No history of depression   Physical Examination  Vitals:   05/16/20 1030 05/16/20 1034  BP: (!) 178/82 (!) 172/81   Pulse: 79   Resp: 20   Temp: 98.3 F (36.8 C)   TempSrc: Temporal   SpO2: 96%   Weight: 128 lb 1.6 oz (58.1 kg)   Height: 5\' 6"  (1.676 m)     Body mass index is 20.68 kg/m.  General:  Alert and oriented, no acute distress HEENT: Normal Neck: No bruit or JVD Pulmonary: Clear to auscultation bilaterally Cardiac: Regular Rate and Rhythm with positive murmur since childhood Gastrointestinal: Soft, non-tender, non-distended, no mass, no scars Skin: No rash Extremity Pulses:  2+ radial, brachial, femoral, dorsalis pedis, posterior tibial pulses bilaterally Musculoskeletal: No deformity or edema  Neurologic: Upper and lower extremity motor 5/5 and symmetric  DATA:  Right Carotid Findings:  +----------+--------+--------+--------+----------------------+--------+       PSV cm/sEDV cm/sStenosisPlaque Description  Comments  +----------+--------+--------+--------+----------------------+--------+  CCA Prox 60   8                         +----------+--------+--------+--------+----------------------+--------+  CCA Mid  61   14   <50%  calcific             +----------+--------+--------+--------+----------------------+--------+  CCA Distal86   10       calcific and irregular      +----------+--------+--------+--------+----------------------+--------+  ICA Prox 64   13   1-39%  irregular and calcific      +----------+--------+--------+--------+----------------------+--------+  ICA Mid  118   21                        +----------+--------+--------+--------+----------------------+--------+  ICA Distal77   15                        +----------+--------+--------+--------+----------------------+--------+  ECA    99   0                          +----------+--------+--------+--------+----------------------+--------+   +----------+--------+-------+----------------+-------------------+       PSV cm/sEDV cmsDescribe    Arm Pressure (mmHG)  +----------+--------+-------+----------------+-------------------+  Subclavian71   0   Multiphasic, WNL            +----------+--------+-------+----------------+-------------------+   +---------+--------+--+--------+--+---------+  VertebralPSV cm/s49EDV cm/s14Antegrade  +---------+--------+--+--------+--+---------+      Left Carotid Findings:  +----------+--------+--------+--------+------------------+--------+       PSV cm/sEDV cm/sStenosisPlaque DescriptionComments  +----------+--------+--------+--------+------------------+--------+  CCA Prox 101   17                      +----------+--------+--------+--------+------------------+--------+  CCA Mid  88   18   <50%  smooth and diffuse      +----------+--------+--------+--------+------------------+--------+  CCA Distal64   14                      +----------+--------+--------+--------+------------------+--------+  ICA Prox 102   32   1-39%  diffuse            +----------+--------+--------+--------+------------------+--------+  ICA Mid  106   27                      +----------+--------+--------+--------+------------------+--------+  ICA Distal82   21                      +----------+--------+--------+--------+------------------+--------+  ECA    65   0                       +----------+--------+--------+--------+------------------+--------+   +----------+--------+--------+----------------+-------------------+       PSV cm/sEDV cm/sDescribe    Arm Pressure (mmHG)   +----------+--------+--------+----------------+-------------------+  [id number]   0    Multiphasic, WNL            +----------+--------+--------+----------------+-------------------+   +---------+--------+--+--------+-+---------+  VertebralPSV cm/s26EDV cm/s6Antegrade  +---------+--------+--+--------+-+---------+     Summary:  Right Carotid: Velocities in the right ICA are consistent with a 1-39%  stenosis.         Non-hemodynamically significant plaque <50% noted in the  CCA.   Left Carotid: Velocities in the left ICA are consistent with a 1-39%  stenosis.        Non-hemodynamically significant plaque <50% noted in the  CCA.  Vertebrals: Bilateral vertebral arteries demonstrate antegrade flow.  Subclavians: Normal flow hemodynamics were seen in bilateral subclavian        arteries.    ASSESSMENT:  [name] is a 86 y.o. female who is s/p left carotid endarterectomy on 11/13/15 by Dr. Bridgett Larsson for left symptomatic carotid stenosis >80%. Duplex is unchanged <39%   PLAN: She is asymptomatic.  Her carotids are stable with < 39% stenosis B.  We reviewed symptoms of stroke and TIA.  If she has these she will call 911, otherwise she will f/u in 18 months for repeat carotid duplex.     Roxy Horseman PA-C [facility] Office: [phone]  MD in clinic [place]

## 2020-06-06 ENCOUNTER — Telehealth: Payer: Self-pay

## 2020-06-06 ENCOUNTER — Other Ambulatory Visit: Payer: Self-pay

## 2020-06-06 ENCOUNTER — Ambulatory Visit (INDEPENDENT_AMBULATORY_CARE_PROVIDER_SITE_OTHER): Payer: Medicare PPO

## 2020-06-06 DIAGNOSIS — E538 Deficiency of other specified B group vitamins: Secondary | ICD-10-CM

## 2020-06-06 MED ORDER — CYANOCOBALAMIN 1000 MCG/ML IJ SOLN
1000.0000 ug | Freq: Once | INTRAMUSCULAR | Status: AC
  Administered 2020-06-06: 1000 ug via INTRAMUSCULAR

## 2020-06-06 NOTE — Telephone Encounter (Signed)
Message sent to [facility] for advise

## 2020-06-06 NOTE — Progress Notes (Signed)
Per orders of [name]  NP, injection of Cyanocobolamin 1,000 mcg/mL given by Wyvonne Lenz. Patient tolerated injection well.

## 2020-06-06 NOTE — Telephone Encounter (Signed)
Okay to continue B12 injections for now and can discuss B12 follow-up labs when her primary is back

## 2020-06-06 NOTE — Telephone Encounter (Signed)
Message sent to Dr Elease Hashimoto for advise

## 2020-06-08 NOTE — Telephone Encounter (Signed)
FYI Spoke with pt state that she does not want to continue with vit B12 injections because she thinks that the cold weather is triggering her COPD since she has to come to the office every month, pt state that she will start taking B12 supplement until [facility] get back to the office.[name] Kitchen

## 2020-06-21 DIAGNOSIS — M545 Low back pain, unspecified: Secondary | ICD-10-CM | POA: Diagnosis not present

## 2020-06-21 DIAGNOSIS — M25552 Pain in left hip: Secondary | ICD-10-CM | POA: Diagnosis not present

## 2020-06-28 DIAGNOSIS — M533 Sacrococcygeal disorders, not elsewhere classified: Secondary | ICD-10-CM | POA: Diagnosis not present

## 2020-07-03 DIAGNOSIS — M545 Low back pain, unspecified: Secondary | ICD-10-CM | POA: Diagnosis not present

## 2020-07-03 DIAGNOSIS — M25552 Pain in left hip: Secondary | ICD-10-CM | POA: Diagnosis not present

## 2020-07-04 ENCOUNTER — Other Ambulatory Visit: Payer: Self-pay | Admitting: Adult Health

## 2020-07-05 ENCOUNTER — Telehealth: Payer: Self-pay | Admitting: Adult Health

## 2020-07-05 NOTE — Telephone Encounter (Signed)
Pt daughter is calling in to check the status of the oral B-12 that they had requested on 06/06/2020 and would like to see if it can be called in today to the pharmacy since the pt took her last B-12 inj.  Pharm:  [facility] [address]

## 2020-07-05 NOTE — Telephone Encounter (Addendum)
Patient daughter is calling and is requesting a call back regarding patient taking B12 injections, please advise. CB is [phone]

## 2020-07-06 NOTE — Telephone Encounter (Signed)
B12 oral is not a prescription. This is bought OTC, look for 1000 mcg

## 2020-07-06 NOTE — Telephone Encounter (Signed)
Left a detailed message on (Lorie) the pts daughter's voicemail with the information below.

## 2020-07-11 DIAGNOSIS — H353113 Nonexudative age-related macular degeneration, right eye, advanced atrophic without subfoveal involvement: Secondary | ICD-10-CM | POA: Diagnosis not present

## 2020-07-11 DIAGNOSIS — H353221 Exudative age-related macular degeneration, left eye, with active choroidal neovascularization: Secondary | ICD-10-CM | POA: Diagnosis not present

## 2020-07-18 DIAGNOSIS — M5416 Radiculopathy, lumbar region: Secondary | ICD-10-CM | POA: Diagnosis not present

## 2020-07-27 ENCOUNTER — Telehealth: Payer: Self-pay | Admitting: Pulmonary Disease

## 2020-07-27 DIAGNOSIS — J42 Unspecified chronic bronchitis: Secondary | ICD-10-CM

## 2020-07-27 NOTE — Telephone Encounter (Signed)
Spoke with pt's Daughter, [place] Police  She states that she is needing to have order sent to [facility] for battery operated neb in case the power goes out  Order sent  Annual rov scheduled

## 2020-07-31 NOTE — Progress Notes (Unsigned)
CARDIOLOGY OFFICE NOTE  Date:  08/09/2020    [name] Date of Birth: 1925-02-27 Medical Record #[id number]  PCP:  [name], NP  Cardiologist:  Gillian Shields  No chief complaint on file.   History of Present Illness: [name] is a 87 y.o. female who presents today for a follow up visit.  Seen for Dr. Johnsie Cancel. Has primarily followed with me over the past several years.    She has a history of labile HTN (intolerant to labetalol and has not tolerated Clonidine), HLD, aortic stenosis, hypothyroidism, CVA (left parietal in Dec. 2015), carotid artery diease w/high-grade left ICA stenosis with plaque ulceration, COPD and pulmonary nodule.  Low risk Myoview in 2017. Anxiety has played significant role in her BP management.      She was to have L CEA on May of 2017 - looks like this complicated by a vocal cord paralysis and "with possible TE from extensive aortic arch atherosclerosis".  She was reluctant to start anticoagulation.  She elected to take ASA and Plavix instead.  But then was admitted with profound anemia in the setting of aspirin and Plavix - transfused - had EGD and noted large AVM that was clipped. She is to stay just on aspirin despite being at risk for embolism from the aortic arch atherosclerosis.    I have followed her since. She does not check her BP at home anymore - makes her too anxious. Has had several COPD exacerbations - now on chronic steroids. Daily alcohol use continues. The pandemic was very hard on her. We have had to cut Norvasc back due to swelling and she has tended to forget some doses of her HCTZ. Last seen in August - had been hospitalized the month prior with COPD exacerbation and was hypoxic - treated with steroids and bronchodilators - then went to the beach. Had stopped her HCTZ. Only on half dose Norvasc. Does not tolerate higher doses of Norvasc due to swelling. Alcohol continues. She has not been interested in follow up echos for her  known AS.    Comes in today. Here with   Past Medical History:  Diagnosis Date  . Anxiety   . Aortic arch atherosclerosis ([place]) 06/24/2014  . Atherosclerotic ulcer of aorta ([place]) 06/24/2014  . Carotid artery occlusion   . Chronic kidney disease   . COPD (chronic obstructive pulmonary disease) ([place])   . [place] DISEASE, LUMBAR 12/16/2008  . DIVERTICULOSIS, COLON 09/30/2008  . DYSPNEA 07/13/2008  . Graves disease   . History of embolic stroke 9/38/1829   Left brain  . HYPERLIPIDEMIA 03/06/2007  . HYPERTENSION 03/06/2007  . HYPOTHYROIDISM 10/13/2007  . OSTEOARTHRITIS 03/06/2007  . Personal history of colonic polyps 09/30/2008  . Stroke ([place])    06/2014           . TOBACCO USE, QUIT 04/12/2009  . WEAKNESS 11/09/2007    Past Surgical History:  Procedure Laterality Date  . CARDIAC CATHETERIZATION    . CATARACT EXTRACTION Bilateral   . CHOLECYSTECTOMY    . ENDARTERECTOMY Left 11/13/2015   Procedure: LEFT CAROTID ARTERY ENDARTERECTOMY;  Surgeon: Conrad Jardine, MD;  Location: [facility];  Service: Vascular;  Laterality: Left;  . ESOPHAGOGASTRODUODENOSCOPY (EGD) WITH PROPOFOL N/A 10/11/2016   Procedure: ESOPHAGOGASTRODUODENOSCOPY (EGD) WITH PROPOFOL;  Surgeon: Mauri Pole, MD;  [facility];  Service: Endoscopy;  Laterality: N/A;  . KNEE SURGERY    . PATCH ANGIOPLASTY Left 11/13/2015   Procedure: WITH 1CM X  6CM  XENOSURE BIOLOGIC PATCH ANGIOPLASTY;  Surgeon: Conrad Lamesa, MD;  Location: [facility];  Service: Vascular;  Laterality: Left;  . TEE WITHOUT CARDIOVERSION N/A 06/24/2014   Procedure: TRANSESOPHAGEAL ECHOCARDIOGRAM (TEE);  Surgeon: Sanda Klein, MD;  [facility] END[facility]COPY;  Service: Cardiovascular;  Laterality: N/A;     Medications: No outpatient medications have been marked as taking for the 08/14/20 encounter (Appointment) with Burtis Junes, NP.     Allergies: Allergies  Allergen Reactions  . Catapres [Clonidine Hcl] Other (See Comments)    Made pt feel horrible, shaky,  weak and nausea  . Lisinopril Anaphylaxis    Tongue swelling  . Catapres [Clonidine]   . Labetalol Hcl Other (See Comments)    Caused bradycardia and syncope    Social History: The patient  reports that she quit smoking about 42 years ago. Her smoking use included cigarettes. She started smoking about 71 years ago. She has a 43.50 pack-year smoking history. She has never used smokeless tobacco. She reports current alcohol use of about 1.0 standard drink of alcohol per week. She reports that she does not use drugs.   Family History: The patient's ***family history includes Stomach cancer in her maternal grandmother.   Review of Systems: Please see the history of present illness.   All other systems are reviewed and negative.   Physical Exam: VS:  There were no vitals taken for this visit. [name] Kitchen  BMI There is no height or weight on file to calculate BMI.  Wt Readings from Last 3 Encounters:  08/08/20 128 lb (58.1 kg)  05/16/20 128 lb 1.6 oz (58.1 kg)  04/24/20 129 lb (58.5 kg)    General: Pleasant. Well developed, well nourished and in no acute distress.   HEENT: Normal.  Neck: Supple, no JVD, carotid bruits, or masses noted.  Cardiac: ***Regular rate and rhythm. No murmurs, rubs, or gallops. No edema.  Respiratory:  Lungs are clear to auscultation bilaterally with normal work of breathing.  GI: Soft and nontender.  MS: No deformity or atrophy. Gait and ROM intact.  Skin: Warm and dry. Color is normal.  Neuro:  Strength and sensation are intact and no gross focal deficits noted.  Psych: Alert, appropriate and with normal affect.   LABORATORY DATA:  EKG:  EKG {ACTION; IS/IS GI:087931 ordered today.  Personally reviewed by me. This demonstrates ***.  Lab Results  Component Value Date   WBC 6.5 03/27/2020   HGB 13.7 03/27/2020   HCT 42.4 03/27/2020   PLT 192 03/27/2020   GLUCOSE 167 (H) 03/27/2020   CHOL 218 (H) 03/24/2019   TRIG 73 03/24/2019   HDL 111 03/24/2019    LDLDIRECT 141.7 05/01/2011   LDLCALC 94 03/24/2019   ALT 19 03/27/2020   AST 23 03/27/2020   NA 138 03/27/2020   K 3.8 03/27/2020   CL 103 03/27/2020   CREATININE 1.00 03/27/2020   BUN 16 03/27/2020   CO2 26 03/27/2020   TSH 1.13 12/01/2019   INR 0.93 08/17/2018   HGBA1C 6.1 (H) 01/15/2020     BNP (last 3 results) Recent Labs    09/09/19 1005 01/14/20 1551 03/27/20 2043  BNP 112.3* 196.6* 205.3*    ProBNP (last 3 results) No results for input(s): PROBNP in the last 8760 hours.   Other Studies Reviewed Today:  Echo Study Conclusions 01/2018   - Left ventricle: The cavity size was normal. There was moderate   concentric hypertrophy. Systolic function was normal. The   estimated ejection  fraction was in the range of 55% to 60%. Wall   motion was normal; there were no regional wall motion   abnormalities. - Aortic valve: Valve mobility was mildly restricted. There was   mild stenosis. There was trivial regurgitation. Valve area (VTI):   1.53 cm^2. Valve area (Vmean): 1.42 cm^2. - Atrial septum: There was increased thickness of the septum,   consistent with lipomatous hypertrophy.   Impressions:   - Normal LV ejection fraction, mild aortic stenosis.     Nm Myocar Multi W/spect W/wall Motion / Ef 10/05/2015    There was no ST segment deviation noted during stress.  Defect 1: There is a medium defect of mild severity present in the mid anteroseptal, mid inferoseptal and apical septal location.  This is a low risk study.  The study is normal.  The left ventricular ejection fraction is normal (55-65%).  Nuclear stress EF: 57%.  Low risk stress nuclear study with LBBB-related perfusion artifact, otherwise normal perfusion and normal left ventricular regional and global systolic function.    CTA Neck (10/09/15) Advanced atherosclerotic plaque of the thoracic aorta and distal aortic arch.   50% diameter stenosis proximal right internal carotid artery   Irregular  plaque of the distal left common carotid artery with plaque ulceration. Very irregular plaque involving the proximal left internal carotid artery. Critical 85% stenosis of the proximal left internal carotid artery   Mild atherosclerotic disease in the right vertebral artery without significant vertebral artery stenosis.   Heterogeneous thyroid bilaterally with 11 mm right lower pole nodule. Recommend thyroid ultrasound.       Assessment/Plan:   1. Labile HTN - BP fair - we have not been able to have adequate control for a variety of reasons - mostly due to her personal choice. Bp is fair - would favor continuing her current regimen.    2. Carotid disease - prior CEA from 7829 - complicated by left vocal paralysis - she has severe atherosclerotic burden in the arch with concerns for embolism.    3. HLD - remains on high dose statin - labs by PCP   4. Severe COPD - on chronic steroid therapy.    5. Prior CVA   6. Daily alcohol use - this is not going to stop. She is drinking one glass a day.    7. Aortic stenosis - has not been interested in updating her echo. No cardinal symptoms - her breathing issues seem more related to her COPD.    91. Advancing age - she is pretty happy - focused on quality. Weight is down. Overall, seems to be holding her own but very tenuous situation.      Current medicines are reviewed with the patient today.  The patient does not have concerns regarding medicines other than what has been noted above.  The following changes have been made:  See above.  Labs/ tests ordered today include:   No orders of the defined types were placed in this encounter.    Disposition:   FU with *** in {gen number 5-62:130865} {Days to years:10300}.   Patient is agreeable to this plan and will call if any problems develop in the interim.   SignedTruitt Merle, NP  08/09/2020 9:29 AM  [facility] [address] [place], [place]  [address] Phone: [phone] Fax: [phone]

## 2020-08-07 DIAGNOSIS — M5459 Other low back pain: Secondary | ICD-10-CM | POA: Diagnosis not present

## 2020-08-08 ENCOUNTER — Encounter: Payer: Self-pay | Admitting: Adult Health

## 2020-08-08 ENCOUNTER — Other Ambulatory Visit: Payer: Self-pay

## 2020-08-08 ENCOUNTER — Telehealth (INDEPENDENT_AMBULATORY_CARE_PROVIDER_SITE_OTHER): Payer: Medicare PPO | Admitting: Adult Health

## 2020-08-08 VITALS — Wt 128.0 lb

## 2020-08-08 DIAGNOSIS — M4807 Spinal stenosis, lumbosacral region: Secondary | ICD-10-CM | POA: Diagnosis not present

## 2020-08-08 MED ORDER — DULOXETINE HCL 30 MG PO CPEP: 30.0000 mg | ORAL_CAPSULE | Freq: Every day | ORAL | 0 refills | Status: DC

## 2020-08-08 NOTE — Progress Notes (Signed)
Virtual Visit via Telephone Note  I connected with [name] on 08/08/20 at  2:30 PM EST by telephone and verified that I am speaking with the correct person using two identifiers.   I discussed the limitations, risks, security and privacy concerns of performing an evaluation and management service by telephone and the availability of in person appointments. I also discussed with the patient that there may be a patient responsible charge related to this service. The patient expressed understanding and agreed to proceed.  Location patient: home Location provider: work or home office Participants present for the call: patient, provider Patient did not have a visit in the prior 7 days to address this/these issue(s).   History of Present Illness: 87 year old female who  has a past medical history of Anxiety, Aortic arch atherosclerosis ([place]) (06/24/2014), Atherosclerotic ulcer of aorta ([place]) (06/24/2014), Carotid artery occlusion, Chronic kidney disease, COPD (chronic obstructive pulmonary disease) ([place]), DISC DISEASE, LUMBAR (12/16/2008), DIVERTICULOSIS, COLON (09/30/2008), DYSPNEA (07/13/2008), Graves disease, History of embolic stroke (4/66/5993), HYPERLIPIDEMIA (03/06/2007), HYPERTENSION (03/06/2007), HYPOTHYROIDISM (10/13/2007), OSTEOARTHRITIS (03/06/2007), Personal history of colonic polyps (09/30/2008), Stroke ([place]), TOBACCO USE, QUIT (04/12/2009), and WEAKNESS (11/09/2007).  She is being evaluated today for chronic left sided low back pain with left-sided sciatica.  He has been seen by orthopedics in the past and has had steroid injections into her lumbar spine but reports that this did not really help with her pain.  She is not a surgical candidate.  She was advised to take Tylenol and has been doing so but gets no relief from this.  Due to her chronic kidney disease we want to keep her away from anti-inflammatory medications.  MRI of lumbar spine in 04/2020 showed  1. Multifactorial degenerative  changes at L4-5 with resultant severe canal stenosis with near complete effacement of the thecal sac. 2. Disc bulging and facet hypertrophy at L3-4 with resultant mild to moderate canal and bilateral lateral recess stenosis. 3. Intra-abdominal aortic aneurysm measuring up to 3 cm. Recommend follow-up by ultrasound every 3 years. This recommendation follows  She was seen by orthopedics earlier last month and from her report states "there is not much more they can do for me since they have will do surgery".  She is wondering if there is any medication that she can take to help get her some relief.  She is gone from being ambulatory to having to use a walker and only able to walk a few steps at a time.   Observations/Objective: Patient sounds cheerful and well on the phone. I do not appreciate any SOB. Speech and thought processing are grossly intact. Patient reported vitals:  Assessment and Plan: 1. Spinal stenosis of lumbosacral region -Trial her on Cymbalta 30 mg daily.  She will follow-up in 2 weeks, at that time we will check her sodium level.  She was advised to follow-up if no improvement over the next 7 to 10 days. - DULoxetine (CYMBALTA) 30 MG capsule; Take 1 capsule (30 mg total) by mouth daily.  Dispense: 30 capsule; Refill: 0   Follow Up Instructions:  I did not refer this patient for an OV in the next 24 hours for this/these issue(s).  I discussed the assessment and treatment plan with the patient. The patient was provided an opportunity to ask questions and all were answered. The patient agreed with the plan and demonstrated an understanding of the instructions.   The patient was advised to call back or seek an in-person evaluation if the  symptoms worsen or if the condition fails to improve as anticipated.  I provided 15  minutes of non-face-to-face time during this encounter.   [name], NP

## 2020-08-09 ENCOUNTER — Other Ambulatory Visit: Payer: Self-pay | Admitting: Pulmonary Disease

## 2020-08-11 ENCOUNTER — Encounter: Payer: Self-pay | Admitting: Adult Health

## 2020-08-11 ENCOUNTER — Other Ambulatory Visit: Payer: Self-pay | Admitting: Adult Health

## 2020-08-11 MED ORDER — DOXEPIN HCL 25 MG PO CAPS: 25.0000 mg | ORAL_CAPSULE | Freq: Every day | ORAL | 0 refills | Status: DC

## 2020-08-11 NOTE — Telephone Encounter (Signed)
Spoke to patients daughter, [name]. Will have her stop Cymbalta and trial Doxepin

## 2020-08-11 NOTE — Telephone Encounter (Signed)
Pts daughter has sent in the below msg and would like to see if they can get some assistance with it.

## 2020-08-14 ENCOUNTER — Ambulatory Visit: Payer: Medicare PPO | Admitting: Nurse Practitioner

## 2020-08-14 NOTE — Telephone Encounter (Signed)
I have spoken with [name] - [name]'s daughter. [name] is having more issues with sciatica. She has become very limited in her ability to be mobile.   They have cancelled today's visit - did not see a reason to come from cardiac standpoint. Labs are being done by PCP.   I did inform her that I am leaving at the end of this month. She is a patient of this practice and if needs arise, we will see back as needed.   Gloria Junes, RN, [facility] [address] [place], [place]  [address] [phone]

## 2020-08-16 ENCOUNTER — Encounter: Payer: Self-pay | Admitting: Adult Health

## 2020-08-22 ENCOUNTER — Other Ambulatory Visit: Payer: Self-pay | Admitting: Adult Health

## 2020-08-22 ENCOUNTER — Encounter: Payer: Self-pay | Admitting: Adult Health

## 2020-08-22 ENCOUNTER — Ambulatory Visit: Payer: Medicare PPO | Admitting: Adult Health

## 2020-08-22 ENCOUNTER — Other Ambulatory Visit: Payer: Self-pay

## 2020-08-22 VITALS — BP 148/77 | Temp 97.5°F | Wt 133.0 lb

## 2020-08-22 DIAGNOSIS — M4807 Spinal stenosis, lumbosacral region: Secondary | ICD-10-CM | POA: Diagnosis not present

## 2020-08-22 MED ORDER — TRAMADOL HCL 50 MG PO TABS
25.0000 mg | ORAL_TABLET | Freq: Four times a day (QID) | ORAL | 0 refills | Status: DC | PRN
Start: 2020-08-22 — End: 2020-08-30

## 2020-08-22 NOTE — Progress Notes (Signed)
Subjective:    Patient ID: [name], female    DOB: [birth year], 87 y.o.   MRN: [id number]  HPI 87 year old female who  has a past medical history of Anxiety, Aortic arch atherosclerosis ([place]) (06/24/2014), Atherosclerotic ulcer of aorta ([place]) (06/24/2014), Carotid artery occlusion, Chronic kidney disease, COPD (chronic obstructive pulmonary disease) ([place]), DISC DISEASE, LUMBAR (12/16/2008), DIVERTICULOSIS, COLON (09/30/2008), DYSPNEA (07/13/2008), Graves disease, History of embolic stroke (6/64/4034), HYPERLIPIDEMIA (03/06/2007), HYPERTENSION (03/06/2007), HYPOTHYROIDISM (10/13/2007), OSTEOARTHRITIS (03/06/2007), Personal history of colonic polyps (09/30/2008), Stroke ([place]), TOBACCO USE, QUIT (04/12/2009), and WEAKNESS (11/09/2007).  She presents to the office today for follow up regarding Spinal Stenosis of lumbosacral area. She has ben deemed a non surgical candidate.   Have tried multiple medications including most recently Cymbalta - which she stopped due to vomiting and not feeling well while on this medication. We then trialed her on Doxepin last week and she stopped this due to dry mouth   She also received a trigger point injections to the left lower back on 06/21/2020, SI joint injection towards the end of December and sometimes a lumbar ESI, all of which have given her no relief.  She has also tried physical therapy in the past that did not help with her symptoms as well as Tylenol and topical lidocaine patches, again these did not help.  Reports no issues with sitting or laying down but when she gets up to walk she cannot  ambulate due to the pain.  Due to  chronic kidney disease we want to stay away from oral anti-inflammatories   Review of Systems See HPI   Past Medical History:  Diagnosis Date  . Anxiety   . Aortic arch atherosclerosis ([place]) 06/24/2014  . Atherosclerotic ulcer of aorta ([place]) 06/24/2014  . Carotid artery occlusion   . Chronic kidney disease   . COPD (chronic  obstructive pulmonary disease) ([place])   . [place] DISEASE, LUMBAR 12/16/2008  . DIVERTICULOSIS, COLON 09/30/2008  . DYSPNEA 07/13/2008  . Graves disease   . History of embolic stroke 7/42/5956   Left brain  . HYPERLIPIDEMIA 03/06/2007  . HYPERTENSION 03/06/2007  . HYPOTHYROIDISM 10/13/2007  . OSTEOARTHRITIS 03/06/2007  . Personal history of colonic polyps 09/30/2008  . Stroke ([place])    06/2014           . TOBACCO USE, QUIT 04/12/2009  . WEAKNESS 11/09/2007    Social History   Socioeconomic History  . Marital status: Divorced    Spouse name: Not on file  . Number of children: 4  . Years of education: college  . Highest education level: Not on file  Occupational History  . Occupation: retired  Tobacco Use  . Smoking status: Former Smoker    Packs/day: 1.50    Years: 29.00    Pack years: 43.50    Types: Cigarettes    Start date: 07/08/1949    Quit date: 07/08/1978    Years since quitting: 42.1  . Smokeless tobacco: Never Used  Vaping Use  . Vaping Use: Never used  Substance and Sexual Activity  . Alcohol use: Yes    Alcohol/week: 1.0 standard drink    Types: 1 Glasses of wine per week    Comment: "red wine"- some nights  . Drug use: No  . Sexual activity: Not on file  Other Topics Concern  . Not on file  Social History Narrative      Patient is right handed.   Patient drinks 1 cup caffeine daily.  Social Determinants of Health   Financial Resource Strain: Not on file  Food Insecurity: Not on file  Transportation Needs: Not on file  Physical Activity: Not on file  Stress: Not on file  Social Connections: Not on file  Intimate Partner Violence: Not on file    Past Surgical History:  Procedure Laterality Date  . CARDIAC CATHETERIZATION    . CATARACT EXTRACTION Bilateral   . CHOLECYSTECTOMY    . ENDARTERECTOMY Left 11/13/2015   Procedure: LEFT CAROTID ARTERY ENDARTERECTOMY;  Surgeon: Conrad York, MD;  Location: [facility];  Service: Vascular;  Laterality: Left;  .  ESOPHAGOGASTRODUODENOSCOPY (EGD) WITH PROPOFOL N/A 10/11/2016   Procedure: ESOPHAGOGASTRODUODENOSCOPY (EGD) WITH PROPOFOL;  Surgeon: Mauri Pole, MD;  [facility];  Service: Endoscopy;  Laterality: N/A;  . KNEE SURGERY    . PATCH ANGIOPLASTY Left 11/13/2015   Procedure: WITH 1CM X 6CM  XENOSURE BIOLOGIC PATCH ANGIOPLASTY;  Surgeon: Conrad New Haven, MD;  Location: [facility];  Service: Vascular;  Laterality: Left;  . TEE WITHOUT CARDIOVERSION N/A 06/24/2014   Procedure: TRANSESOPHAGEAL ECHOCARDIOGRAM (TEE);  Surgeon: Sanda Klein, MD;  [facility] ENDOSCOPY;  Service: Cardiovascular;  Laterality: N/A;    Family History  Problem Relation Age of Onset  . Stomach cancer Maternal Grandmother   . Colon cancer Neg Hx     Allergies  Allergen Reactions  . Catapres [Clonidine Hcl] Other (See Comments)    Made pt feel horrible, shaky, weak and nausea  . Lisinopril Anaphylaxis    Tongue swelling  . Catapres [Clonidine]   . Labetalol Hcl Other (See Comments)    Caused bradycardia and syncope    Current Outpatient Medications on File Prior to Visit  Medication Sig Dispense Refill  . albuterol (PROAIR HFA) 108 (90 Base) MCG/ACT inhaler Inhale 2 puffs into the lungs every 6 (six) hours as needed for wheezing or shortness of breath. 8.5 Inhaler 5  . amLODipine (NORVASC) 10 MG tablet Take 0.5 tablets (5 mg total) by mouth daily. 30 tablet 3  . atorvastatin (LIPITOR) 80 MG tablet TAKE 1 TABLET BY MOUTH EVERY DAY AT 6PM 90 tablet 1  . hydrALAZINE (APRESOLINE) 25 MG tablet TAKE 3 TABLETS BY MOUTH 3 TIMES A DAY (Patient taking differently: Take 75 mg by mouth 3 (three) times daily.) 270 tablet 11  . ipratropium-albuterol (DUONEB) 0.5-2.5 (3) MG/3ML SOLN Take 3 mLs by nebulization every 6 (six) hours as needed (copd). 360 mL 11  . latanoprost (XALATAN) 0.005 % ophthalmic solution Place 1 drop into both eyes at bedtime.     [name] Kitchen levothyroxine (SYNTHROID) 75 MCG tablet Take 1 tablet (75 mcg total) by  mouth daily before breakfast. 90 tablet 3  . predniSONE (DELTASONE) 5 MG tablet prednisone 5 mg tablet  TAKE 1/2 (2.5mg ) - 1 TABLET (5 MG) BY ORAL ROUTE ONCE DAILY    . Spacer/Aero-Holding Chambers (AEROCHAMBER MV) inhaler Use as instructed (Patient taking differently: 1 each by Other route as directed.) 1 each 0  . TRELEGY ELLIPTA 100-62.5-25 MCG/INH AEPB INHALE 1 PUFF BY MOUTH EVERY DAY 60 each 3   No current facility-administered medications on file prior to visit.    BP (!) 148/77   Temp (!) 97.5 F (36.4 C)   Wt 133 lb (60.3 kg)   BMI 21.47 kg/m       Objective:   Physical Exam Vitals and nursing note reviewed.  Constitutional:      Appearance: Normal appearance.  Cardiovascular:     Rate and  Rhythm: Normal rate and regular rhythm.     Pulses: Normal pulses.     Heart sounds: Normal heart sounds.  Pulmonary:     Effort: Pulmonary effort is normal.     Breath sounds: Normal breath sounds.  Musculoskeletal:        General: Normal range of motion.  Skin:    General: Skin is warm and dry.     Capillary Refill: Capillary refill takes less than 2 seconds.  Neurological:     General: No focal deficit present.     Mental Status: She is alert and oriented to person, place, and time.     Motor: Weakness present.     Gait: Gait abnormal.     Comments: In wheelchair for exam   Psychiatric:        Mood and Affect: Mood normal.        Behavior: Behavior normal.        Thought Content: Thought content normal.        Judgment: Judgment normal.       Assessment & Plan:  1. Spinal stenosis of lumbosacral region -Exhausted pretty much all of our options to help give her some relief.  I am okay with her trialing Voltaren gel on her lower back as very little this actually reaches the bloodstream.  We will also prescribe tramadol 25 to 50 mg by mouth as needed.  She was advised of side effects of this medication, both she and her daughter are okay with trialing.  They will follow-up  with me with results - traMADol (ULTRAM) 50 MG tablet; Take 0.5-1 tablets (25-50 mg total) by mouth every 6 (six) hours as needed.  Dispense: 30 tablet; Refill: 0   [name], NP

## 2020-08-30 ENCOUNTER — Emergency Department (HOSPITAL_COMMUNITY): Payer: Medicare PPO

## 2020-08-30 ENCOUNTER — Encounter (HOSPITAL_COMMUNITY): Payer: Self-pay | Admitting: Internal Medicine

## 2020-08-30 ENCOUNTER — Encounter: Payer: Self-pay | Admitting: Adult Health

## 2020-08-30 ENCOUNTER — Inpatient Hospital Stay (HOSPITAL_COMMUNITY)
Admission: EM | Admit: 2020-08-30 | Discharge: 2020-09-04 | DRG: 291 | Disposition: A | Discharge status: Home Health | Payer: Medicare PPO | Attending: Internal Medicine | Admitting: Internal Medicine

## 2020-08-30 ENCOUNTER — Other Ambulatory Visit: Payer: Self-pay

## 2020-08-30 DIAGNOSIS — R0689 Other abnormalities of breathing: Secondary | ICD-10-CM | POA: Diagnosis not present

## 2020-08-30 DIAGNOSIS — K5900 Constipation, unspecified: Secondary | ICD-10-CM | POA: Diagnosis not present

## 2020-08-30 DIAGNOSIS — E782 Mixed hyperlipidemia: Secondary | ICD-10-CM | POA: Diagnosis present

## 2020-08-30 DIAGNOSIS — J9600 Acute respiratory failure, unspecified whether with hypoxia or hypercapnia: Secondary | ICD-10-CM | POA: Diagnosis not present

## 2020-08-30 DIAGNOSIS — N179 Acute kidney failure, unspecified: Secondary | ICD-10-CM | POA: Diagnosis not present

## 2020-08-30 DIAGNOSIS — I2510 Atherosclerotic heart disease of native coronary artery without angina pectoris: Secondary | ICD-10-CM | POA: Diagnosis present

## 2020-08-30 DIAGNOSIS — I499 Cardiac arrhythmia, unspecified: Secondary | ICD-10-CM | POA: Diagnosis not present

## 2020-08-30 DIAGNOSIS — I352 Nonrheumatic aortic (valve) stenosis with insufficiency: Secondary | ICD-10-CM | POA: Diagnosis present

## 2020-08-30 DIAGNOSIS — I480 Paroxysmal atrial fibrillation: Secondary | ICD-10-CM | POA: Diagnosis present

## 2020-08-30 DIAGNOSIS — E876 Hypokalemia: Secondary | ICD-10-CM | POA: Diagnosis not present

## 2020-08-30 DIAGNOSIS — J189 Pneumonia, unspecified organism: Secondary | ICD-10-CM | POA: Diagnosis not present

## 2020-08-30 DIAGNOSIS — I248 Other forms of acute ischemic heart disease: Secondary | ICD-10-CM | POA: Diagnosis not present

## 2020-08-30 DIAGNOSIS — Z800 Family history of malignant neoplasm of digestive organs: Secondary | ICD-10-CM | POA: Diagnosis not present

## 2020-08-30 DIAGNOSIS — R778 Other specified abnormalities of plasma proteins: Secondary | ICD-10-CM

## 2020-08-30 DIAGNOSIS — Z7951 Long term (current) use of inhaled steroids: Secondary | ICD-10-CM

## 2020-08-30 DIAGNOSIS — J9811 Atelectasis: Secondary | ICD-10-CM | POA: Diagnosis not present

## 2020-08-30 DIAGNOSIS — R0902 Hypoxemia: Secondary | ICD-10-CM | POA: Diagnosis not present

## 2020-08-30 DIAGNOSIS — I5042 Chronic combined systolic (congestive) and diastolic (congestive) heart failure: Secondary | ICD-10-CM

## 2020-08-30 DIAGNOSIS — Z79890 Hormone replacement therapy (postmenopausal): Secondary | ICD-10-CM

## 2020-08-30 DIAGNOSIS — I471 Supraventricular tachycardia: Secondary | ICD-10-CM | POA: Diagnosis present

## 2020-08-30 DIAGNOSIS — I429 Cardiomyopathy, unspecified: Secondary | ICD-10-CM | POA: Diagnosis present

## 2020-08-30 DIAGNOSIS — E039 Hypothyroidism, unspecified: Secondary | ICD-10-CM

## 2020-08-30 DIAGNOSIS — J90 Pleural effusion, not elsewhere classified: Secondary | ICD-10-CM

## 2020-08-30 DIAGNOSIS — Z66 Do not resuscitate: Secondary | ICD-10-CM | POA: Diagnosis present

## 2020-08-30 DIAGNOSIS — I5021 Acute systolic (congestive) heart failure: Secondary | ICD-10-CM | POA: Diagnosis not present

## 2020-08-30 DIAGNOSIS — T502X5A Adverse effect of carbonic-anhydrase inhibitors, benzothiadiazides and other diuretics, initial encounter: Secondary | ICD-10-CM | POA: Diagnosis not present

## 2020-08-30 DIAGNOSIS — R7989 Other specified abnormal findings of blood chemistry: Secondary | ICD-10-CM | POA: Diagnosis not present

## 2020-08-30 DIAGNOSIS — Z20822 Contact with and (suspected) exposure to covid-19: Secondary | ICD-10-CM | POA: Diagnosis present

## 2020-08-30 DIAGNOSIS — I130 Hypertensive heart and chronic kidney disease with heart failure and stage 1 through stage 4 chronic kidney disease, or unspecified chronic kidney disease: Secondary | ICD-10-CM | POA: Diagnosis present

## 2020-08-30 DIAGNOSIS — I5043 Acute on chronic combined systolic (congestive) and diastolic (congestive) heart failure: Secondary | ICD-10-CM | POA: Diagnosis present

## 2020-08-30 DIAGNOSIS — I350 Nonrheumatic aortic (valve) stenosis: Secondary | ICD-10-CM | POA: Diagnosis not present

## 2020-08-30 DIAGNOSIS — J438 Other emphysema: Secondary | ICD-10-CM | POA: Diagnosis not present

## 2020-08-30 DIAGNOSIS — Z803 Family history of malignant neoplasm of breast: Secondary | ICD-10-CM

## 2020-08-30 DIAGNOSIS — Z8673 Personal history of transient ischemic attack (TIA), and cerebral infarction without residual deficits: Secondary | ICD-10-CM | POA: Diagnosis not present

## 2020-08-30 DIAGNOSIS — I517 Cardiomegaly: Secondary | ICD-10-CM | POA: Diagnosis not present

## 2020-08-30 DIAGNOSIS — M1990 Unspecified osteoarthritis, unspecified site: Secondary | ICD-10-CM | POA: Diagnosis present

## 2020-08-30 DIAGNOSIS — R0600 Dyspnea, unspecified: Secondary | ICD-10-CM | POA: Diagnosis not present

## 2020-08-30 DIAGNOSIS — R339 Retention of urine, unspecified: Secondary | ICD-10-CM | POA: Diagnosis not present

## 2020-08-30 DIAGNOSIS — J449 Chronic obstructive pulmonary disease, unspecified: Secondary | ICD-10-CM | POA: Diagnosis not present

## 2020-08-30 DIAGNOSIS — I700 Atherosclerosis of aorta: Secondary | ICD-10-CM | POA: Diagnosis present

## 2020-08-30 DIAGNOSIS — N1831 Chronic kidney disease, stage 3a: Secondary | ICD-10-CM | POA: Diagnosis present

## 2020-08-30 DIAGNOSIS — J439 Emphysema, unspecified: Secondary | ICD-10-CM

## 2020-08-30 DIAGNOSIS — Z87891 Personal history of nicotine dependence: Secondary | ICD-10-CM

## 2020-08-30 DIAGNOSIS — M48062 Spinal stenosis, lumbar region with neurogenic claudication: Secondary | ICD-10-CM

## 2020-08-30 DIAGNOSIS — J441 Chronic obstructive pulmonary disease with (acute) exacerbation: Secondary | ICD-10-CM | POA: Diagnosis not present

## 2020-08-30 DIAGNOSIS — I4891 Unspecified atrial fibrillation: Secondary | ICD-10-CM | POA: Diagnosis not present

## 2020-08-30 DIAGNOSIS — Z7952 Long term (current) use of systemic steroids: Secondary | ICD-10-CM

## 2020-08-30 DIAGNOSIS — I10 Essential (primary) hypertension: Secondary | ICD-10-CM | POA: Diagnosis not present

## 2020-08-30 DIAGNOSIS — R9431 Abnormal electrocardiogram [ECG] [EKG]: Secondary | ICD-10-CM | POA: Diagnosis not present

## 2020-08-30 DIAGNOSIS — Z888 Allergy status to other drugs, medicaments and biological substances status: Secondary | ICD-10-CM

## 2020-08-30 DIAGNOSIS — Z79899 Other long term (current) drug therapy: Secondary | ICD-10-CM

## 2020-08-30 DIAGNOSIS — I5041 Acute combined systolic (congestive) and diastolic (congestive) heart failure: Secondary | ICD-10-CM | POA: Diagnosis not present

## 2020-08-30 DIAGNOSIS — R0602 Shortness of breath: Secondary | ICD-10-CM | POA: Diagnosis present

## 2020-08-30 LAB — CBC WITH DIFFERENTIAL/PLATELET
Abs Immature Granulocytes: 0.02 10*3/uL (ref 0.00–0.07)
Basophils Absolute: 0 10*3/uL (ref 0.0–0.1)
Basophils Relative: 1 %
Eosinophils Absolute: 0.1 10*3/uL (ref 0.0–0.5)
Eosinophils Relative: 1 %
HCT: 38.2 % (ref 36.0–46.0)
Hemoglobin: 12.9 g/dL (ref 12.0–15.0)
Immature Granulocytes: 0 %
Lymphocytes Relative: 12 %
Lymphs Abs: 0.7 10*3/uL (ref 0.7–4.0)
MCH: 31.3 pg (ref 26.0–34.0)
MCHC: 33.8 g/dL (ref 30.0–36.0)
MCV: 92.7 fL (ref 80.0–100.0)
Monocytes Absolute: 0.6 10*3/uL (ref 0.1–1.0)
Monocytes Relative: 10 %
Neutro Abs: 4.3 10*3/uL (ref 1.7–7.7)
Neutrophils Relative %: 76 %
Platelets: 176 10*3/uL (ref 150–400)
RBC: 4.12 MIL/uL (ref 3.87–5.11)
RDW: 13.2 % (ref 11.5–15.5)
WBC: 5.6 10*3/uL (ref 4.0–10.5)
nRBC: 0 % (ref 0.0–0.2)

## 2020-08-30 LAB — I-STAT VENOUS BLOOD GAS, ED
Acid-Base Excess: 4 mmol/L — ABNORMAL HIGH (ref 0.0–2.0)
Bicarbonate: 26.5 mmol/L (ref 20.0–28.0)
Calcium, Ion: 1.05 mmol/L — ABNORMAL LOW (ref 1.15–1.40)
HCT: 37 % (ref 36.0–46.0)
Hemoglobin: 12.6 g/dL (ref 12.0–15.0)
O2 Saturation: 100 %
Potassium: 4 mmol/L (ref 3.5–5.1)
Sodium: 133 mmol/L — ABNORMAL LOW (ref 135–145)
TCO2: 27 mmol/L (ref 22–32)
pCO2, Ven: 31 mmHg — ABNORMAL LOW (ref 44.0–60.0)
pH, Ven: 7.539 — ABNORMAL HIGH (ref 7.250–7.430)
pO2, Ven: 147 mmHg — ABNORMAL HIGH (ref 32.0–45.0)

## 2020-08-30 LAB — BASIC METABOLIC PANEL
Anion gap: 9 (ref 5–15)
BUN: 18 mg/dL (ref 8–23)
CO2: 24 mmol/L (ref 22–32)
Calcium: 8.7 mg/dL — ABNORMAL LOW (ref 8.9–10.3)
Chloride: 99 mmol/L (ref 98–111)
Creatinine, Ser: 1.06 mg/dL — ABNORMAL HIGH (ref 0.44–1.00)
GFR, Estimated: 49 mL/min — ABNORMAL LOW (ref >60)
Glucose, Bld: 147 mg/dL — ABNORMAL HIGH (ref 70–99)
Potassium: 3.8 mmol/L (ref 3.5–5.1)
Sodium: 132 mmol/L — ABNORMAL LOW (ref 135–145)

## 2020-08-30 LAB — TROPONIN I (HIGH SENSITIVITY)
Troponin I (High Sensitivity): 82 ng/L — ABNORMAL HIGH (ref <18)
Troponin I (High Sensitivity): 73 ng/L — ABNORMAL HIGH (ref <18)

## 2020-08-30 LAB — BRAIN NATRIURETIC PEPTIDE: B Natriuretic Peptide: 348.6 pg/mL — ABNORMAL HIGH (ref 0.0–100.0)

## 2020-08-30 LAB — MAGNESIUM: Magnesium: 2.1 mg/dL (ref 1.7–2.4)

## 2020-08-30 LAB — PROCALCITONIN: Procalcitonin: <0.1 ng/mL

## 2020-08-30 MED ORDER — LATANOPROST 0.005 % OP SOLN
1.0000 [drp] | Freq: Every day | OPHTHALMIC | Status: DC
  Administered 2020-08-31 – 2020-09-03 (×4): 1 [drp] via OPHTHALMIC
  Filled 2020-08-30: qty 2.5

## 2020-08-30 MED ORDER — ACETAMINOPHEN 325 MG PO TABS: 650.0000 mg | ORAL_TABLET | ORAL | Status: DC | PRN

## 2020-08-30 MED ORDER — LEVOTHYROXINE SODIUM 75 MCG PO TABS
75.0000 ug | ORAL_TABLET | Freq: Every day | ORAL | Status: DC
  Administered 2020-08-31 – 2020-09-04 (×5): 75 ug via ORAL
  Filled 2020-08-30 (×5): qty 1

## 2020-08-30 MED ORDER — PREDNISONE 10 MG PO TABS
5.0000 mg | ORAL_TABLET | Freq: Every day | ORAL | Status: DC
  Administered 2020-08-31 – 2020-09-04 (×5): 5 mg via ORAL
  Filled 2020-08-30 (×6): qty 1

## 2020-08-30 MED ORDER — IOHEXOL 350 MG/ML SOLN
100.0000 mL | Freq: Once | INTRAVENOUS | Status: AC | PRN
  Administered 2020-08-30: 63 mL via INTRAVENOUS

## 2020-08-30 MED ORDER — LORAZEPAM 1 MG PO TABS
1.0000 mg | ORAL_TABLET | Freq: Once | ORAL | Status: AC
  Administered 2020-08-30: 1 mg via ORAL
  Filled 2020-08-30: qty 1

## 2020-08-30 MED ORDER — IPRATROPIUM-ALBUTEROL 0.5-2.5 (3) MG/3ML IN SOLN: 3.0000 mL | Freq: Four times a day (QID) | RESPIRATORY_TRACT | Status: DC | PRN

## 2020-08-30 MED ORDER — UMECLIDINIUM BROMIDE 62.5 MCG/INH IN AEPB
1.0000 | INHALATION_SPRAY | Freq: Every day | RESPIRATORY_TRACT | Status: DC
  Administered 2020-09-01 – 2020-09-03 (×3): 1 via RESPIRATORY_TRACT
  Filled 2020-08-30: qty 7

## 2020-08-30 MED ORDER — HYDRALAZINE HCL 50 MG PO TABS
75.0000 mg | ORAL_TABLET | Freq: Three times a day (TID) | ORAL | Status: DC
  Administered 2020-08-31: 75 mg via ORAL
  Filled 2020-08-30: qty 3

## 2020-08-30 MED ORDER — HYDRALAZINE HCL 20 MG/ML IJ SOLN: 10.0000 mg | Freq: Four times a day (QID) | INTRAMUSCULAR | Status: DC | PRN

## 2020-08-30 MED ORDER — ATORVASTATIN CALCIUM 80 MG PO TABS
80.0000 mg | ORAL_TABLET | Freq: Every evening | ORAL | Status: DC
  Administered 2020-08-31 – 2020-09-03 (×5): 80 mg via ORAL
  Filled 2020-08-30 (×6): qty 1

## 2020-08-30 MED ORDER — POLYETHYLENE GLYCOL 3350 17 G PO PACK
17.0000 g | PACK | Freq: Every day | ORAL | Status: DC | PRN
  Administered 2020-08-31 – 2020-09-03 (×3): 17 g via ORAL
  Filled 2020-08-30 (×3): qty 1

## 2020-08-30 MED ORDER — ENOXAPARIN SODIUM 40 MG/0.4ML ~~LOC~~ SOLN
40.0000 mg | SUBCUTANEOUS | Status: DC
  Administered 2020-08-31 (×2): 40 mg via SUBCUTANEOUS
  Filled 2020-08-30 (×2): qty 0.4

## 2020-08-30 MED ORDER — FLUTICASONE FUROATE-VILANTEROL 100-25 MCG/INH IN AEPB
1.0000 | INHALATION_SPRAY | Freq: Every day | RESPIRATORY_TRACT | Status: DC
  Administered 2020-09-01 – 2020-09-04 (×4): 1 via RESPIRATORY_TRACT
  Filled 2020-08-30: qty 28

## 2020-08-30 MED ORDER — FUROSEMIDE 10 MG/ML IJ SOLN
20.0000 mg | Freq: Two times a day (BID) | INTRAMUSCULAR | Status: AC
  Administered 2020-08-31 (×2): 20 mg via INTRAVENOUS
  Filled 2020-08-30 (×3): qty 2

## 2020-08-30 MED ORDER — ONDANSETRON HCL 4 MG/2ML IJ SOLN: 4.0000 mg | Freq: Four times a day (QID) | INTRAMUSCULAR | Status: DC | PRN

## 2020-08-30 MED ORDER — IPRATROPIUM-ALBUTEROL 0.5-2.5 (3) MG/3ML IN SOLN: 3.0000 mL | RESPIRATORY_TRACT | Status: DC | PRN

## 2020-08-30 MED ORDER — FUROSEMIDE 10 MG/ML IJ SOLN
40.0000 mg | Freq: Once | INTRAMUSCULAR | Status: AC
  Administered 2020-08-30: 40 mg via INTRAVENOUS
  Filled 2020-08-30: qty 4

## 2020-08-30 NOTE — ED Provider Notes (Signed)
[facility] EMERGENCY DEPARTMENT Provider Note   CSN: [id number] Arrival date & time: 08/30/20  1646     History Chief Complaint  Patient presents with  . Shortness of Breath    [name] is a 87 y.o. female.  Presents ER with concern for shortness of breath.  Patient reports this afternoon she suddenly felt short of breath, tried taking home nebulizer and rescue inhaler without any significant relief.  States that symptoms did improve after receiving oxygen by EMS.  States that she feels somewhat anxious and somewhat short of breath but denies any other complaints.  Says that she has been feeling generally unwell, fatigued over the past few days.  Has not had any associated chest pain, no new cough, fever, nausea vomiting or abdominal pain.  COPD, hypertension, coronary artery disease, lumbar spinal stenosis, mild aortic stenosis (echo 01/2018), hypothyroidism   HPI     Past Medical History:  Diagnosis Date  . Anxiety   . Aortic arch atherosclerosis ([place]) 06/24/2014  . Atherosclerotic ulcer of aorta ([place]) 06/24/2014  . Carotid artery occlusion   . Chronic kidney disease   . COPD (chronic obstructive pulmonary disease) ([place])   . [place] DISEASE, LUMBAR 12/16/2008  . DIVERTICULOSIS, COLON 09/30/2008  . DYSPNEA 07/13/2008  . Graves disease   . History of embolic stroke 10/30/9561   Left brain  . HYPERLIPIDEMIA 03/06/2007  . HYPERTENSION 03/06/2007  . HYPOTHYROIDISM 10/13/2007  . OSTEOARTHRITIS 03/06/2007  . Personal history of colonic polyps 09/30/2008  . Stroke ([place])    06/2014           . TOBACCO USE, QUIT 04/12/2009  . WEAKNESS 11/09/2007    Patient Active Problem List   Diagnosis Date Noted  . New onset atrial fibrillation ([place]) 08/30/2020  . Mild aortic stenosis 08/30/2020  . Elevated troponin level not due myocardial infarction 08/30/2020  . Pleural effusion on left 08/30/2020  . Pain in joint of left knee 04/10/2020  . D-dimer, elevated 01/14/2020  .  Elevated brain natriuretic peptide (BNP) level 01/14/2020  . Advice given about COVID-19 virus infection 07/21/2019  . Current chronic use of systemic steroids 07/13/2019  . Medication management 04/02/2018  . COPD exacerbation ([place]) 03/31/2018  . Hypokalemia 03/30/2018  . COPD with acute exacerbation ([place]) 03/02/2018  . SOB (shortness of breath) 01/12/2018  . CKD (chronic kidney disease), stage III 01/12/2018  . New onset left bundle branch block (LBBB) 01/12/2018  . COPD GOLD I D 01/12/2018  . Neurogenic claudication due to lumbar spinal stenosis 09/13/2017  . Symptomatic anemia   . AVM (arteriovenous malformation) of duodenum, acquired with hemorrhage   . Acute GI bleeding 10/09/2016  . COPD with emphysema ([place]) 05/10/2016  . Recurrent laryngeal neuropathy 02/01/2016  . Nausea with vomiting 12/11/2015  . Left carotid stenosis 11/21/2015  . Asymptomatic carotid artery stenosis 11/13/2015  . Impaired glucose tolerance 10/27/2015  . Solitary pulmonary nodule 10/27/2015  . Thyroid nodule 10/27/2015  . Syncope 10/04/2015  . Bradycardia with less than 60 beats per minute 10/04/2015  . History of embolic stroke 87/56/4332  . Paresthesia of both feet 07/06/2014  . Aortic arch atherosclerosis ([place]) 06/24/2014  . Atherosclerotic ulcer of aorta ([place]) 06/24/2014  . Stroke ([place]) 06/22/2014  . Bilateral carotid artery disease ([place]) 05/18/2014  . TOBACCO USE, QUIT 04/12/2009  . [place] DISEASE, LUMBAR 12/16/2008  . DIVERTICULOSIS, COLON 09/30/2008  . Personal history of colonic polyps 09/30/2008  . DYSPNEA 07/13/2008  . Weakness 11/09/2007  .  Hypothyroidism 10/13/2007  . Mixed hyperlipidemia 03/06/2007  . Essential hypertension 03/06/2007  . Osteoarthritis 03/06/2007    Past Surgical History:  Procedure Laterality Date  . CARDIAC CATHETERIZATION    . CATARACT EXTRACTION Bilateral   . CHOLECYSTECTOMY    . ENDARTERECTOMY Left 11/13/2015   Procedure: LEFT CAROTID ARTERY ENDARTERECTOMY;   Surgeon: Conrad [place], MD;  Location: [facility];  Service: Vascular;  Laterality: Left;  . ESOPHAGOGASTRODUODENOSCOPY (EGD) WITH PROPOFOL N/A 10/11/2016   Procedure: ESOPHAGOGASTRODUODENOSCOPY (EGD) WITH PROPOFOL;  Surgeon: Mauri Pole, MD;  Location: [facility] ENDOSCOPY;  Service: Endoscopy;  Laterality: N/A;  . KNEE SURGERY    . PATCH ANGIOPLASTY Left 11/13/2015   Procedure: WITH 1CM X 6CM  XENOSURE BIOLOGIC PATCH ANGIOPLASTY;  Surgeon: Conrad , MD;  Location: [facility];  Service: Vascular;  Laterality: Left;  . TEE WITHOUT CARDIOVERSION N/A 06/24/2014   Procedure: TRANSESOPHAGEAL ECHOCARDIOGRAM (TEE);  Surgeon: Sanda Klein, MD;  Location[facility] ENDOSCOPY;  Service: Cardiovascular;  Laterality: N/A;     OB History   No obstetric history on file.     Family History  Problem Relation Age of Onset  . Stomach cancer Maternal Grandmother   . Colon cancer Neg Hx     Social History   Tobacco Use  . Smoking status: Former Smoker    Packs/day: 1.50    Years: 29.00    Pack years: 43.50    Types: Cigarettes    Start date: 07/08/1949    Quit date: 07/08/1978    Years since quitting: 42.1  . Smokeless tobacco: Never Used  Vaping Use  . Vaping Use: Never used  Substance Use Topics  . Alcohol use: Yes    Alcohol/week: 1.0 standard drink    Types: 1 Glasses of wine per week    Comment: "red wine"- some nights  . Drug use: No    Home Medications Prior to Admission medications   Medication Sig Start Date End Date Taking? Authorizing Provider  albuterol (PROAIR HFA) 108 (90 Base) MCG/ACT inhaler Inhale 2 puffs into the lungs every 6 (six) hours as needed for wheezing or shortness of breath. 11/13/17  Yes Volanda Napoleon, PA-C  atorvastatin (LIPITOR) 80 MG tablet TAKE 1 TABLET BY MOUTH EVERY DAY AT 6PM Patient taking differently: Take 80 mg by mouth daily. 04/20/20  Yes Nafziger, Tommi Rumps, NP  hydrALAZINE (APRESOLINE) 25 MG tablet TAKE 3 TABLETS BY MOUTH 3 TIMES A DAY Patient taking differently:  Take 75 mg by mouth 3 (three) times daily. 08/30/19  Yes Burtis Junes, NP  ipratropium-albuterol (DUONEB) 0.5-2.5 (3) MG/3ML SOLN Take 3 mLs by nebulization every 6 (six) hours as needed (copd). 01/16/20  Yes Danford, Suann Larry, MD  latanoprost (XALATAN) 0.005 % ophthalmic solution Place 1 drop into both eyes at bedtime.  04/02/19  Yes [provider]  levothyroxine (SYNTHROID) 75 MCG tablet Take 1 tablet (75 mcg total) by mouth daily before breakfast. 07/04/20  Yes Nafziger, Tommi Rumps, NP  predniSONE (DELTASONE) 5 MG tablet Take 5-10 mg by mouth daily with breakfast.   Yes [provider]  TRELEGY ELLIPTA 100-62.5-25 MCG/INH AEPB INHALE 1 [place] Patient taking differently: Inhale 1 puff into the lungs daily. 08/09/20  Yes Marshell Garfinkel, MD  Spacer/Aero-Holding Chambers (AEROCHAMBER MV) inhaler Use as instructed Patient taking differently: 1 each by Other route as directed. 11/26/17   Magdalen Spatz, NP    Allergies    Catapres [clonidine hcl], Lisinopril, and Labetalol hcl  Review  of Systems   Review of Systems  Constitutional: Negative for chills and fever.  HENT: Negative for ear pain and sore throat.   Eyes: Negative for pain and visual disturbance.  Respiratory: Positive for shortness of breath. Negative for cough.   Cardiovascular: Negative for chest pain and palpitations.  Gastrointestinal: Negative for abdominal pain and vomiting.  Genitourinary: Negative for dysuria and hematuria.  Musculoskeletal: Negative for arthralgias and back pain.  Skin: Negative for color change and rash.  Neurological: Negative for seizures and syncope.  All other systems reviewed and are negative.   Physical Exam Updated Vital Signs BP 131/62   Pulse 71   Temp 98 F (36.7 C) (Oral)   Resp 19   Ht 5\' 6"  (1.676 m)   Wt 60.3 kg   SpO2 95%   BMI 21.47 kg/m   Physical Exam Vitals and nursing note reviewed.  Constitutional:      General: She is not in acute  distress.    Appearance: She is well-developed and well-nourished.  HENT:     Head: Normocephalic and atraumatic.  Eyes:     Conjunctiva/sclera: Conjunctivae normal.  Cardiovascular:     Rate and Rhythm: Normal rate and regular rhythm.     Heart sounds: No murmur heard.   Pulmonary:     Breath sounds: Normal breath sounds.     Comments: Somewhat tachypneic, breath sounds were clear Abdominal:     Palpations: Abdomen is soft.     Tenderness: There is no abdominal tenderness.  Musculoskeletal:        General: No edema.     Cervical back: Neck supple.     Right lower leg: No edema.     Left lower leg: No edema.  Skin:    General: Skin is warm and dry.  Neurological:     General: No focal deficit present.     Mental Status: She is alert.  Psychiatric:        Mood and Affect: Mood is anxious.     ED Results / Procedures / Treatments   Labs (all labs ordered are listed, but only abnormal results are displayed) Labs Reviewed  BASIC METABOLIC PANEL - Abnormal; Notable for the following components:      Result Value   Sodium 132 (*)    Glucose, Bld 147 (*)    Creatinine, Ser 1.06 (*)    Calcium 8.7 (*)    GFR, Estimated 49 (*)    All other components within normal limits  BRAIN NATRIURETIC PEPTIDE - Abnormal; Notable for the following components:   B Natriuretic Peptide 348.6 (*)    All other components within normal limits  I-STAT VENOUS BLOOD GAS, ED - Abnormal; Notable for the following components:   pH, Ven 7.539 (*)    pCO2, Ven 31.0 (*)    pO2, Ven 147.0 (*)    Acid-Base Excess 4.0 (*)    Sodium 133 (*)    Calcium, Ion 1.05 (*)    All other components within normal limits  TROPONIN I (HIGH SENSITIVITY) - Abnormal; Notable for the following components:   Troponin I (High Sensitivity) 82 (*)    All other components within normal limits  TROPONIN I (HIGH SENSITIVITY) - Abnormal; Notable for the following components:   Troponin I (High Sensitivity) 73 (*)    All  other components within normal limits  CULTURE, BLOOD (ROUTINE X 2)  CULTURE, BLOOD (ROUTINE X 2)  SARS CORONAVIRUS 2 (TAT 6-24 HRS)  CBC WITH DIFFERENTIAL/PLATELET  MAGNESIUM  PROCALCITONIN  BLOOD GAS, VENOUS  PROCALCITONIN  TSH  COMPREHENSIVE METABOLIC PANEL  MAGNESIUM  PROTIME-INR  APTT    EKG EKG Interpretation  Date/Time:  Wednesday August 30 2020 16:51:38 EST Ventricular Rate:  87 PR Interval:    QRS Duration: 119 QT Interval:  399 QTC Calculation: 480 R Axis:   -36 Text Interpretation: Atrial fibrillation Nonspecific IVCD with LAD LVH with secondary repolarization abnormality Anterior infarct, old Confirmed by Madalyn Rob [phone]) on 08/30/2020 9:28:24 PM   Radiology DG Chest 2 View  Result Date: 08/30/2020 CLINICAL DATA:  Shortness of breath EXAM: CHEST - 2 VIEW COMPARISON:  03/27/2020 FINDINGS: Small left-sided pleural effusion with basilar airspace disease. Mild cardiomegaly with aortic atherosclerosis. Coarse chronic interstitial opacity. No pneumothorax. IMPRESSION: Small left pleural effusion with basilar atelectasis or pneumonia. Mild cardiomegaly. Electronically Signed   By: Donavan Foil M.D.   On: 08/30/2020 17:48   CT Angio Chest PE W and/or Wo Contrast  Result Date: 08/30/2020 CLINICAL DATA:  Dyspnea EXAM: CT ANGIOGRAPHY CHEST WITH CONTRAST TECHNIQUE: Multidetector CT imaging of the chest was performed using the standard protocol during bolus administration of intravenous contrast. Multiplanar CT image reconstructions and MIPs were obtained to evaluate the vascular anatomy. CONTRAST:  2mL OMNIPAQUE IOHEXOL 350 MG/ML SOLN COMPARISON:  Chest x-ray 08/30/2020, CT chest 01/14/2020, CT chest 04/03/2018 FINDINGS: Cardiovascular: Satisfactory opacification of the pulmonary arteries to the segmental level. No evidence of pulmonary embolism. Extensive aortic atherosclerosis and irregular mural disease. No dissection is seen. Negative for aortic aneurysm. Coronary  vascular calcification. Mild cardiomegaly with trace pericardial effusion. Mediastinum/Nodes: Midline trachea. No suspicious thyroid nodule. No significant mediastinal adenopathy. Esophagus within normal limits. Lungs/Pleura: Emphysema. Small left-sided pleural effusion, slightly increased compared to prior. Favor dependent atelectasis left lower lobe over pneumonia. Scattered areas of scarring within the bilateral upper lobes. Probable scarring in the right lower lobe. Upper Abdomen: No acute abnormality. Musculoskeletal: No chest wall abnormality. No acute or significant osseous findings. Review of the MIP images confirms the above findings. IMPRESSION: 1. Negative for acute pulmonary embolus or aortic dissection. 2. Emphysema. Small left-sided pleural effusion, slightly increased compared to prior. Partial consolidation left lower lobe, favor atelectasis over pneumonia. 3. Cardiomegaly with trace pericardial effusion. 4. Emphysema and aortic atherosclerosis. Aortic Atherosclerosis (ICD10-I70.0) and Emphysema (ICD10-J43.9). Electronically Signed   By: Donavan Foil M.D.   On: 08/30/2020 20:20    Procedures Procedures   Medications Ordered in ED Medications  levothyroxine (SYNTHROID) tablet 75 mcg (has no administration in time range)  umeclidinium bromide (INCRUSE ELLIPTA) 62.5 MCG/INH 1 puff (has no administration in time range)  latanoprost (XALATAN) 0.005 % ophthalmic solution 1 drop (has no administration in time range)  acetaminophen (TYLENOL) tablet 650 mg (has no administration in time range)  ondansetron (ZOFRAN) injection 4 mg (has no administration in time range)  enoxaparin (LOVENOX) injection 40 mg (has no administration in time range)  polyethylene glycol (MIRALAX / GLYCOLAX) packet 17 g (has no administration in time range)  fluticasone furoate-vilanterol (BREO ELLIPTA) 100-25 MCG/INH 1 puff (has no administration in time range)  furosemide (LASIX) injection 20 mg (has no  administration in time range)  hydrALAZINE (APRESOLINE) injection 10 mg (has no administration in time range)  atorvastatin (LIPITOR) tablet 80 mg (has no administration in time range)  hydrALAZINE (APRESOLINE) tablet 75 mg (has no administration in time range)  predniSONE (DELTASONE) tablet 5 mg (has no administration in time range)  ipratropium-albuterol (DUONEB) 0.5-2.5 (3) MG/3ML nebulizer solution 3 mL (  has no administration in time range)  LORazepam (ATIVAN) tablet 1 mg (1 mg Oral Given 08/30/20 1734)  iohexol (OMNIPAQUE) 350 MG/ML injection 100 mL (63 mLs Intravenous Contrast Given 08/30/20 1959)  furosemide (LASIX) injection 40 mg (40 mg Intravenous Given 08/30/20 2157)    ED Course  I have reviewed the triage vital signs and the nursing notes.  Pertinent labs & imaging results that were available during my care of the patient were reviewed by me and considered in my medical decision making (see chart for details).    MDM Rules/Calculators/A&P                         86 year old lady presenting to ER with concern for acute onset shortness of breath this afternoon.  On my assessment, patient was somewhat tachypneic, appeared anxious but vital signs were stable.  Completed broad work-up, no acute ischemic changes on EKG, likely in A. Fib which is new for patient.  Troponin mildly elevated, though repeat troponin was stable.  BNP mildly elevated.  CTA chest negative for PE, did demonstrate effusion, area of atelectasis.  Concern for possible heart failure exacerbation.  Lower suspicion for infectious etiology, will send procalcitonin.  Given her elevated troponin, symptomatology, believe she would benefit from observation admission, echocardiogram, trial of IV diuresis.  Reviewed case with Dr. Cyd Silence who will admit patient.  Final Clinical Impression(s) / ED Diagnoses Final diagnoses:  Acute respiratory failure, unspecified whether with hypoxia or hypercapnia (HCC)  Elevated troponin   Elevated brain natriuretic peptide (BNP) level    Rx / DC Orders ED Discharge Orders    None       Lucrezia Starch, MD 08/30/20 2345

## 2020-08-30 NOTE — ED Notes (Signed)
Pt returned from radiology.

## 2020-08-30 NOTE — H&P (Signed)
History and Physical    [name] UVO:[id number] DOB: January 06, 1925 DOA: 08/30/2020  PCP: [name], NP  Patient coming from: Home via EMS.   Chief Complaint:  Chief Complaint  Patient presents with  . Shortness of Breath     HPI:    87 year old female with past medical history of COPD, hypertension, coronary artery disease, lumbar spinal stenosis, mild aortic stenosis (echo 01/2018), hypothyroidism who presents to [facility] emergency department via EMS with complaints of shortness of breath.  Patient explains that for approximately the past 3 to 4 days she has been experiencing generalized malaise and fatigue.  She has also noticed that as of late she is also been experiencing shortness of breath with exertion although she is unable to identify how long this has been going on.  Patient also states that she has been experiencing lower extremity edema, particularly in the right lower extremity.  Patient denies any associated paroxysmal nocturnal dyspnea or pillow orthopnea.  Patient denies any change in her chronic cough, any fever or sick contacts.  Of note, patient is vaccinated for COVID-19 and has received a COVID-19 booster.  Early this afternoon, the patient was sitting on her couch watching TV she states that she began to develop worsening shortness of breath.  Shortness of breath was severe in intensity, worse with minimal exertion and improved with rest.  Patient denies any associated chest pain but does complain of associated palpitations.  Patient symptoms persisted for several hours and eventually EMS was contacted who promptly came to evaluate the patient.  Upon their initial assessment they noted that the patient was in atrial fibrillation.  Patient reported no history of this in the past.  Patient was placed on submental oxygen and brought the patient into [facility] emergency department for evaluation.  Upon evaluation in the emergency department  patient continued to remain in controlled atrial fibrillation.  Patient was weaned off of her supplemental oxygen but continued to complain of shortness of breath.  Chest x-ray did reveal left-sided pleural effusion with associated atelectasis of the left lower lobe.  CT angiogram was then performed and was found to be negative for pulmonary embolism with evidence of emphysema and again redemonstration of left-sided pleural effusion.  Troponin was performed and was found to be 82.  Patient remained chest pain-free in the emergency department.  The hospitalist group was then called to assess the patient for admission to the hospital.  Review of Systems:   Review of Systems  Constitutional: Positive for malaise/fatigue.  Respiratory: Positive for cough and shortness of breath.   Cardiovascular: Positive for palpitations and leg swelling.  Neurological: Positive for weakness.  All other systems reviewed and are negative.   Past Medical History:  Diagnosis Date  . Anxiety   . Aortic arch atherosclerosis ([place]) 06/24/2014  . Atherosclerotic ulcer of aorta ([place]) 06/24/2014  . Carotid artery occlusion   . Chronic kidney disease   . COPD (chronic obstructive pulmonary disease) ([place])   . Johnson DISEASE, LUMBAR 12/16/2008  . DIVERTICULOSIS, COLON 09/30/2008  . DYSPNEA 07/13/2008  . Graves disease   . History of embolic stroke 7/42/5956   Left brain  . HYPERLIPIDEMIA 03/06/2007  . HYPERTENSION 03/06/2007  . HYPOTHYROIDISM 10/13/2007  . OSTEOARTHRITIS 03/06/2007  . Personal history of colonic polyps 09/30/2008  . Stroke ([place])    06/2014           . TOBACCO USE, QUIT 04/12/2009  . WEAKNESS 11/09/2007    Past  Surgical History:  Procedure Laterality Date  . CARDIAC CATHETERIZATION    . CATARACT EXTRACTION Bilateral   . CHOLECYSTECTOMY    . ENDARTERECTOMY Left 11/13/2015   Procedure: LEFT CAROTID ARTERY ENDARTERECTOMY;  Surgeon: Conrad [place], MD;  Location: [place];  Service: Vascular;  Laterality: Left;  .  ESOPHAGOGASTRODUODENOSCOPY (EGD) WITH PROPOFOL N/A 10/11/2016   Procedure: ESOPHAGOGASTRODUODENOSCOPY (EGD) WITH PROPOFOL;  Surgeon: Mauri Pole, MD;  Location: [facility] ENDOSCOPY;  Service: Endoscopy;  Laterality: N/A;  . KNEE SURGERY    . PATCH ANGIOPLASTY Left 11/13/2015   Procedure: WITH 1CM X 6CM  XENOSURE BIOLOGIC PATCH ANGIOPLASTY;  Surgeon: Conrad Linden, MD;  Location: [place];  Service: Vascular;  Laterality: Left;  . TEE WITHOUT CARDIOVERSION N/A 06/24/2014   Procedure: TRANSESOPHAGEAL ECHOCARDIOGRAM (TEE);  Surgeon: Sanda Klein, MD;  Location: [facility] ENDOSCOPY;  Service: Cardiovascular;  Laterality: N/A;     reports that she quit smoking about 42 years ago. Her smoking use included cigarettes. She started smoking about 71 years ago. She has a 43.50 pack-year smoking history. She has never used smokeless tobacco. She reports current alcohol use of about 1.0 standard drink of alcohol per week. She reports that she does not use drugs.  Allergies  Allergen Reactions  . Catapres [Clonidine Hcl] Other (See Comments)    Made pt feel horrible, shaky, weak and nausea  . Lisinopril Anaphylaxis    Tongue swelling  . Labetalol Hcl Other (See Comments)    Caused bradycardia and syncope    Family History  Problem Relation Age of Onset  . Stomach cancer Maternal Grandmother   . Colon cancer Neg Hx      Prior to Admission medications   Medication Sig Start Date End Date Taking? Authorizing Provider  albuterol (PROAIR HFA) 108 (90 Base) MCG/ACT inhaler Inhale 2 puffs into the lungs every 6 (six) hours as needed for wheezing or shortness of breath. 11/13/17  Yes Volanda Napoleon, PA-C  atorvastatin (LIPITOR) 80 MG tablet TAKE 1 TABLET BY MOUTH EVERY DAY AT 6PM Patient taking differently: Take 80 mg by mouth daily. 04/20/20  Yes Nafziger, Tommi Rumps, NP  hydrALAZINE (APRESOLINE) 25 MG tablet TAKE 3 TABLETS BY MOUTH 3 TIMES A DAY Patient taking differently: Take 75 mg by mouth 3 (three) times daily.  08/30/19  Yes Burtis Junes, NP  ipratropium-albuterol (DUONEB) 0.5-2.5 (3) MG/3ML SOLN Take 3 mLs by nebulization every 6 (six) hours as needed (copd). 01/16/20  Yes Danford, Suann Larry, MD  levothyroxine (SYNTHROID) 75 MCG tablet Take 1 tablet (75 mcg total) by mouth daily before breakfast. 07/04/20  Yes Nafziger, Tommi Rumps, NP  predniSONE (DELTASONE) 5 MG tablet Take 5-10 mg by mouth daily with breakfast.   Yes [provider]  TRELEGY ELLIPTA 100-62.5-25 MCG/INH AEPB INHALE 1 [place] Patient taking differently: Inhale 1 puff into the lungs daily. 08/09/20  Yes Mannam, Praveen, MD  latanoprost (XALATAN) 0.005 % ophthalmic solution Place 1 drop into both eyes at bedtime.  04/02/19   [provider]  Spacer/Aero-Holding Chambers (AEROCHAMBER MV) inhaler Use as instructed Patient taking differently: 1 each by Other route as directed. 11/26/17   Magdalen Spatz, NP    Physical Exam: Vitals:   08/30/20 2115 08/30/20 2130 08/30/20 2145 08/30/20 2200  BP: 127/75 (!) 121/54 (!) 114/58 (!) 141/81  Pulse: 74 65 62 65  Resp: 19 20 18 18   Temp:      TempSrc:      SpO2: 94% 93%  94% 96%  Weight:      Height:        Constitutional: Acute alert and oriented x3, no associated distress.   Skin: no rashes, no lesions, notable poor skin turgor.   Eyes: Pupils are equally reactive to light.  No evidence of scleral icterus or conjunctival pallor.  ENMT: Moist mucous membranes noted.  Posterior pharynx clear of any exudate or lesions.  Neck: normal, supple, no masses, no thyromegaly.  Slight elevation of jugular venous pulse at 30 degrees.   Respiratory: Mild bibasilar rales.  No evidence of wheezing.  Patient is somewhat tachypneic without accessory muscle use.  Cardiovascular: Irregularly irregular rhythm with controlled rate.  3/6 systolic crescendo decrescendo murmur heard best over the aortic valve.  Trace bilateral lower extremity pitting edema.  No carotid bruits.   Chest:   Nontender without crepitus or deformity.   Back:   Nontender without crepitus or deformity. Abdomen: Abdomen is soft and nontender.  No evidence of intra-abdominal masses.  Positive bowel sounds noted in all quadrants.   Musculoskeletal: No joint deformity upper and lower extremities. Good ROM, no contractures. Normal muscle tone.  Neurologic: CN 2-12 grossly intact. Sensation intact.  Patient moving all 4 extremities spontaneously.  Patient is following all commands.  Patient is responsive to verbal stimuli.   Psychiatric: Patient exhibits normal mood with appropriate affect.  Patient seems to possess insight as to their current situation.     Labs on Admission: I have personally reviewed following labs and imaging studies -   CBC: Recent Labs  Lab 08/30/20 1657 08/30/20 1758  WBC 5.6  --   NEUTROABS 4.3  --   HGB 12.9 12.6  HCT 38.2 37.0  MCV 92.7  --   PLT 176  --    Basic Metabolic Panel: Recent Labs  Lab 08/30/20 1657 08/30/20 1758  NA 132* 133*  K 3.8 4.0  CL 99  --   CO2 24  --   GLUCOSE 147*  --   BUN 18  --   CREATININE 1.06*  --   CALCIUM 8.7*  --   MG 2.1  --    GFR: Estimated Creatinine Clearance: 31 mL/min (A) (by C-G formula based on SCr of 1.06 mg/dL (H)). Liver Function Tests: No results for input(s): AST, ALT, ALKPHOS, BILITOT, PROT, ALBUMIN in the last 168 hours. No results for input(s): LIPASE, AMYLASE in the last 168 hours. No results for input(s): AMMONIA in the last 168 hours. Coagulation Profile: No results for input(s): INR, PROTIME in the last 168 hours. Cardiac Enzymes: No results for input(s): CKTOTAL, CKMB, CKMBINDEX, TROPONINI in the last 168 hours. BNP (last 3 results) No results for input(s): PROBNP in the last 8760 hours. HbA1C: No results for input(s): HGBA1C in the last 72 hours. CBG: No results for input(s): GLUCAP in the last 168 hours. Lipid Profile: No results for input(s): CHOL, HDL, LDLCALC, TRIG, CHOLHDL,  LDLDIRECT in the last 72 hours. Thyroid Function Tests: No results for input(s): TSH, T4TOTAL, FREET4, T3FREE, THYROIDAB in the last 72 hours. Anemia Panel: No results for input(s): VITAMINB12, FOLATE, FERRITIN, TIBC, IRON, RETICCTPCT in the last 72 hours. Urine analysis:    Component Value Date/Time   COLORURINE YELLOW 12/01/2019 1028   APPEARANCEUR [place] (A) 12/01/2019 1028   LABSPEC 1.015 12/01/2019 1028   PHURINE 6.0 12/01/2019 1028   GLUCOSEU NEGATIVE 12/01/2019 1028   HGBUR NEGATIVE 12/01/2019 [place] 12/01/2019 1028   BILIRUBINUR n 08/17/2012 1455  KETONESUR NEGATIVE 12/01/2019 1028   PROTEINUR NEGATIVE 09/02/2018 1622   UROBILINOGEN 0.2 12/01/2019 1028   NITRITE NEGATIVE 12/01/2019 1028   LEUKOCYTESUR SMALL (A) 12/01/2019 1028    Radiological Exams on Admission - Personally Reviewed: DG Chest 2 View  Result Date: 08/30/2020 CLINICAL DATA:  Shortness of breath EXAM: CHEST - 2 VIEW COMPARISON:  03/27/2020 FINDINGS: Small left-sided pleural effusion with basilar airspace disease. Mild cardiomegaly with aortic atherosclerosis. Coarse chronic interstitial opacity. No pneumothorax. IMPRESSION: Small left pleural effusion with basilar atelectasis or pneumonia. Mild cardiomegaly. Electronically Signed   By: Donavan Foil M.D.   On: 08/30/2020 17:48   CT Angio Chest PE W and/or Wo Contrast  Result Date: 08/30/2020 CLINICAL DATA:  Dyspnea EXAM: CT ANGIOGRAPHY CHEST WITH CONTRAST TECHNIQUE: Multidetector CT imaging of the chest was performed using the standard protocol during bolus administration of intravenous contrast. Multiplanar CT image reconstructions and MIPs were obtained to evaluate the vascular anatomy. CONTRAST:  66mL OMNIPAQUE IOHEXOL 350 MG/ML SOLN COMPARISON:  Chest x-ray 08/30/2020, CT chest 01/14/2020, CT chest 04/03/2018 FINDINGS: Cardiovascular: Satisfactory opacification of the pulmonary arteries to the segmental level. No evidence of pulmonary  embolism. Extensive aortic atherosclerosis and irregular mural disease. No dissection is seen. Negative for aortic aneurysm. Coronary vascular calcification. Mild cardiomegaly with trace pericardial effusion. Mediastinum/Nodes: Midline trachea. No suspicious thyroid nodule. No significant mediastinal adenopathy. Esophagus within normal limits. Lungs/Pleura: Emphysema. Small left-sided pleural effusion, slightly increased compared to prior. Favor dependent atelectasis left lower lobe over pneumonia. Scattered areas of scarring within the bilateral upper lobes. Probable scarring in the right lower lobe. Upper Abdomen: No acute abnormality. Musculoskeletal: No chest wall abnormality. No acute or significant osseous findings. Review of the MIP images confirms the above findings. IMPRESSION: 1. Negative for acute pulmonary embolus or aortic dissection. 2. Emphysema. Small left-sided pleural effusion, slightly increased compared to prior. Partial consolidation left lower lobe, favor atelectasis over pneumonia. 3. Cardiomegaly with trace pericardial effusion. 4. Emphysema and aortic atherosclerosis. Aortic Atherosclerosis (ICD10-I70.0) and Emphysema (ICD10-J43.9). Electronically Signed   By: Donavan Foil M.D.   On: 08/30/2020 20:20    EKG: Personally reviewed.  Rhythm is atrial fibrillation with heart rate of 87 bpm.  Nonspecific intraventricular conduction delay noted.  No dynamic ST segment changes appreciated.  Assessment/Plan Principal Problem:   New onset atrial fibrillation [facility])   Patient presenting with several day history of malaise and dyspnea on exertion as well as leg swelling followed by several hour history of progressively worsening shortness of breath and palpitations at rest.  Patient found to be in atrial fibrillation albeit rate controlled with left-sided pleural effusion on chest imaging and mild peripheral edema  Unclear as to how long patient has been in atrial  fibrillation  Considering patient is currently rate controlled will abstain from initiation of AV nodal blocking agents at this time  Patient exhibiting mild evidence of volume overload, possibly from some relative degree of transient diastolic dysfunction in the setting of atrial fibrillation  Patient has therefore been given a dose of Lasix 40 mg IV in the emergency department.  We will keep patient on a short course of low-dose intravenous Lasix for several more doses  Monitoring patient on telemetry  Echocardiogram in the morning  CT angiogram of the chest reveals no evidence of pulmonary embolism  Cycling cardiac enzymes  Obtaining TSH to ensure that this is at target  Chads Vasc score is 5.  While this would typically warrant initiation of anticoagulation, patient has a  history of substantial lumbar spinal stenosis and a significant difficulty with ambulation requiring a walker placing her at a high fall risk.  Patient states that she wishes to think about taking an anticoagulant -weighing the benefit of stroke risk reduction with the risk of possible significant bleeding with a fall.  Active Problems:   Hypothyroidism  . Resume home regimen of Synthroid . TSH ordered and pending to ensure that this is at target in the setting of new diagnosis of atrial fibrillation.    Mixed hyperlipidemia  . Continuing home regimen of lipid lowering therapy.    Essential hypertension  . Resume patients home regimen or oral antihypertensives . Titrate antihypertensive regimen as necessary to achieve adequate BP control . PRN intravenous antihypertensives for excessively elevated blood pressure     COPD with emphysema (HCC)   Known history of advanced COPD with emphysema  No evidence of COPD exacerbation this time on examination  Continue home regimen of maintenance inhalers  Continuing home regimen of daily low-dose prednisone.  As needed bronchodilator therapy for episodic  shortness of breath and wheezing.    Neurogenic claudication due to lumbar spinal stenosis   Patient complains of longstanding history of progressively worsening low back pain and difficulty with ambulation  Will place PT evaluation  Fall precaution  As needed analgesics -patient has recently been prescribed with Ultram in the outpatient setting and states that it typically makes her nauseated    Elevated troponin level not due myocardial infarction   Slightly elevated troponin, likely in the setting of new onset of atrial fibrillation  Patient is currently chest pain-free  Monitoring patient on telemetry  Cycling cardiac enzymes    Pleural effusion on left   Left-sided pleural effusion along with mild peripheral edema in setting of new diagnosis of atrial fibrillation  This may be secondary to some degree of transient diastolic dysfunction in the setting of atrial fibrillation  Short course of intravenous diuretics  No clinical evidence of pneumonia    Mild aortic stenosis  Known history of mild aortic stenosis based on echocardiogram 01/2018  We will reassess on repeat echocardiogram in the morning    Code Status:  Full code Family Communication: Daughter has already been updated on patient's condition by emergency department staff  Status is: Observation  The patient remains OBS appropriate and will d/c before 2 midnights.  Dispo: The patient is from: Home              Anticipated d/c is to: Home              Anticipated d/c date is: 2 days              Patient currently is not medically stable to d/c.   Difficult to place patient No        Vernelle Emerald MD [facility] Pager [phone]  If 7PM-7AM, please contact night-coverage [email or web address] Use universal [place] password for that web site. If you do not have the password, please call the hospital operator.  08/30/2020, 10:36 PM

## 2020-08-30 NOTE — ED Triage Notes (Signed)
Pt bib [facility] from home. Pt has not felt well x 4 days, started having shortness of breath around 1530, took her home neb and rescue inhaler w/o relief.  Pt placed on 2LNC by EMS, 02 changed from 96% on room air to 98% on 2 LNC.  Pt was found to be in afib by EMS which pt reported was new for her. VSS.

## 2020-08-31 ENCOUNTER — Observation Stay (HOSPITAL_COMMUNITY): Payer: Medicare PPO

## 2020-08-31 DIAGNOSIS — R7989 Other specified abnormal findings of blood chemistry: Secondary | ICD-10-CM

## 2020-08-31 DIAGNOSIS — J9601 Acute respiratory failure with hypoxia: Secondary | ICD-10-CM | POA: Insufficient documentation

## 2020-08-31 DIAGNOSIS — J9600 Acute respiratory failure, unspecified whether with hypoxia or hypercapnia: Secondary | ICD-10-CM

## 2020-08-31 DIAGNOSIS — I4891 Unspecified atrial fibrillation: Secondary | ICD-10-CM

## 2020-08-31 DIAGNOSIS — R778 Other specified abnormalities of plasma proteins: Secondary | ICD-10-CM | POA: Diagnosis not present

## 2020-08-31 LAB — ECHOCARDIOGRAM COMPLETE
AR max vel: 0.87 cm2
AV Area VTI: 0.78 cm2
AV Area mean vel: 0.84 cm2
AV Mean grad: 19.5 mmHg
AV Peak grad: 36.8 mmHg
Ao pk vel: 3.04 m/s
Area-P 1/2: 2.48 cm2
Height: 66 in
P 1/2 time: 321 msec
S' Lateral: 3.5 cm
Weight: 2128 oz

## 2020-08-31 LAB — COMPREHENSIVE METABOLIC PANEL
ALT: 16 U/L (ref 0–44)
AST: 18 U/L (ref 15–41)
Albumin: 3 g/dL — ABNORMAL LOW (ref 3.5–5.0)
Alkaline Phosphatase: 69 U/L (ref 38–126)
Anion gap: 10 (ref 5–15)
BUN: 16 mg/dL (ref 8–23)
CO2: 25 mmol/L (ref 22–32)
Calcium: 8.5 mg/dL — ABNORMAL LOW (ref 8.9–10.3)
Chloride: 101 mmol/L (ref 98–111)
Creatinine, Ser: 1.17 mg/dL — ABNORMAL HIGH (ref 0.44–1.00)
GFR, Estimated: 44 mL/min — ABNORMAL LOW (ref >60)
Glucose, Bld: 102 mg/dL — ABNORMAL HIGH (ref 70–99)
Potassium: 3.2 mmol/L — ABNORMAL LOW (ref 3.5–5.1)
Sodium: 136 mmol/L (ref 135–145)
Total Bilirubin: 0.9 mg/dL (ref 0.3–1.2)
Total Protein: 4.9 g/dL — ABNORMAL LOW (ref 6.5–8.1)

## 2020-08-31 LAB — URINALYSIS, COMPLETE (UACMP) WITH MICROSCOPIC
Bilirubin Urine: NEGATIVE
Glucose, UA: NEGATIVE mg/dL
Hgb urine dipstick: NEGATIVE
Ketones, ur: NEGATIVE mg/dL
Leukocytes,Ua: NEGATIVE
Nitrite: NEGATIVE
Protein, ur: NEGATIVE mg/dL
Specific Gravity, Urine: 1.01 (ref 1.005–1.030)
pH: 6 (ref 5.0–8.0)

## 2020-08-31 LAB — PROTIME-INR
INR: 1.1 (ref 0.8–1.2)
Prothrombin Time: 13.7 seconds (ref 11.4–15.2)

## 2020-08-31 LAB — PROCALCITONIN: Procalcitonin: <0.1 ng/mL

## 2020-08-31 LAB — MAGNESIUM: Magnesium: 1.9 mg/dL (ref 1.7–2.4)

## 2020-08-31 LAB — TSH: TSH: 3.587 u[IU]/mL (ref 0.350–4.500)

## 2020-08-31 LAB — APTT: aPTT: 36 seconds (ref 24–36)

## 2020-08-31 LAB — SARS CORONAVIRUS 2 (TAT 6-24 HRS): SARS Coronavirus 2: NEGATIVE

## 2020-08-31 MED ORDER — HYDRALAZINE HCL 50 MG PO TABS
75.0000 mg | ORAL_TABLET | Freq: Three times a day (TID) | ORAL | Status: DC
  Administered 2020-08-31 – 2020-09-04 (×12): 75 mg via ORAL
  Filled 2020-08-31 (×2): qty 1
  Filled 2020-08-31: qty 3
  Filled 2020-08-31 (×9): qty 1

## 2020-08-31 MED ORDER — POTASSIUM CHLORIDE CRYS ER 20 MEQ PO TBCR
40.0000 meq | EXTENDED_RELEASE_TABLET | Freq: Once | ORAL | Status: AC
  Administered 2020-08-31: 40 meq via ORAL
  Filled 2020-08-31: qty 2

## 2020-08-31 MED ORDER — HYDRALAZINE HCL 25 MG PO TABS
25.0000 mg | ORAL_TABLET | Freq: Three times a day (TID) | ORAL | Status: DC
  Administered 2020-08-31: 25 mg via ORAL
  Filled 2020-08-31: qty 1

## 2020-08-31 NOTE — ED Notes (Signed)
Attempted to give reportx1 

## 2020-08-31 NOTE — Plan of Care (Signed)
  Problem: Education: Goal: Knowledge of General Education information will improve Description Including pain rating scale, medication(s)/side effects and non-pharmacologic comfort measures Outcome: Progressing   

## 2020-08-31 NOTE — Progress Notes (Signed)
PROGRESS NOTE    [name]  FGH:[id number] DOB: 24-Mar-1925 DOA: 08/30/2020 PCP: [name], NP   Brief Narrative: 87 year old with past medical history significant for COPD, hypertension, CAD, lumbar spinal stenosis, mild aortic stenosis, hypothyroidism who presents to [facility] complaining of shortness of breath.  She reports shortness of breath on exertion, unable to identify for how long she has been experiencing these symptoms.  She does report 3 to 4 days history of generalized malaise and fatigue.  Developed worsening shortness of breath today while watching TV.  EMS was contacted and patient was found to be in A. Fib.  Evaluation in the ED: Chest x-ray revealed left-sided pleural effusion with associated atelectasis of the left lower lobe.  CT angio was negative for PE with evidence of emphysema and left-sided pleural effusion.  Troponin mildly at elevated at 82   Assessment & Plan:   Principal Problem:   New onset atrial fibrillation (HCC) Active Problems:   Hypothyroidism   Mixed hyperlipidemia   Essential hypertension   COPD with emphysema (HCC)   Neurogenic claudication due to lumbar spinal stenosis   Mild aortic stenosis   Elevated troponin level not due myocardial infarction   Pleural effusion on left  1-new onset A fib:  -Currently rate controlled. -Echo pending -CT angio negative for PE. -Cardiology consulted. Follow recommendation for anticoagulation.  -Mild elevation of troponin.   2-Acute  diastolic heart failure exacerbation: Presented with SOB, BNP 348, mild elevation troponin. Chest X ray: Small left pleural effusion with basilar atelectasis or pneumonia. Mild cardiomegaly. On IV lasix 20 mg IV BID time two doses.  Cardiology consulted.   3-Hypothyroidism: Continue with Synthroid.   4-Hyperlipidemia: Continue with Lipitor.   5-Hypertension: Continue with hydralazine lower dose.   6-COPD: No significant evidence of exacerbation.   Continue with low dose prednisone, Incruse, Duoneb PRN.   Neurogenic claudication due to lumbar spinal stenosis: PT, consult.  Tylenol PRN pain.   Mild elevation of troponin: Suspect related to HF and or A fib.   Pleural effusion on the left; Continue with IV lasix.   Mild aortic stenosis: ECHO ordered.   Hypokalemia: Replete orally 40 meq time 2.   Urinary retention: Had In and out cath last night 800 and 1 L out put.       Estimated body mass index is 21.47 kg/m as calculated from the following:   Height as of this encounter: 5\' 6"  (1.676 m).   Weight as of this encounter: 60.3 kg.   DVT prophylaxis: Lovenox Code Status: DNR, discussed with patient and daughter Family Communication: Daughter who was at bedside.  Disposition Plan:  Status is: Observation  The patient remains OBS appropriate and will d/c before 2 midnights.  Dispo: The patient is from: Home              Anticipated d/c is to: SNF              Anticipated d/c date is: 2 days              Patient currently is not medically stable to d/c.   Difficult to place patient No        Consultants:   Cardiology   Procedures:   ECHO  Antimicrobials:    Subjective: She report breathing better than on admission.  She denies chest pain or abdominal pain.   Objective: Vitals:   08/31/20 0215 08/31/20 0230 08/31/20 0245 08/31/20 0600  BP: (!) 123/38 (!) 128/50 [name] Kitchen)  118/38 (!) 119/52  Pulse: 71 73 (!) 37 (!) 59  Resp: 19 20 20 15   Temp:    98.3 F (36.8 C)  TempSrc:    Oral  SpO2: 94% 94% 94% 98%  Weight:      Height:        Intake/Output Summary (Last 24 hours) at 08/31/2020 0737 Last data filed at 08/31/2020 0200 Gross per 24 hour  Intake -  Output 2763 ml  Net -2763 ml   Filed Weights   08/30/20 1655  Weight: 60.3 kg    Examination:  General exam: Appears calm and comfortable  Respiratory system: BL crackles.  Cardiovascular system: S1 & S2 heard, IRR Gastrointestinal  system: Abdomen is nondistended, soft and nontender. No organomegaly or masses felt. Normal bowel sounds heard. Central nervous system: Alert and oriented.  Extremities: Symmetric 5 x 5 power.    Data Reviewed: I have personally reviewed following labs and imaging studies  CBC: Recent Labs  Lab 08/30/20 1657 08/30/20 1758  WBC 5.6  --   NEUTROABS 4.3  --   HGB 12.9 12.6  HCT 38.2 37.0  MCV 92.7  --   PLT 176  --    Basic Metabolic Panel: Recent Labs  Lab 08/30/20 1657 08/30/20 1758 08/31/20 0320  NA 132* 133* 136  K 3.8 4.0 3.2*  CL 99  --  101  CO2 24  --  25  GLUCOSE 147*  --  102*  BUN 18  --  16  CREATININE 1.06*  --  1.17*  CALCIUM 8.7*  --  8.5*  MG 2.1  --  1.9   GFR: Estimated Creatinine Clearance: 28.1 mL/min (A) (by C-G formula based on SCr of 1.17 mg/dL (H)). Liver Function Tests: Recent Labs  Lab 08/31/20 0320  AST 18  ALT 16  ALKPHOS 69  BILITOT 0.9  PROT 4.9*  ALBUMIN 3.0*   No results for input(s): LIPASE, AMYLASE in the last 168 hours. No results for input(s): AMMONIA in the last 168 hours. Coagulation Profile: Recent Labs  Lab 08/31/20 0320  INR 1.1   Cardiac Enzymes: No results for input(s): CKTOTAL, CKMB, CKMBINDEX, TROPONINI in the last 168 hours. BNP (last 3 results) No results for input(s): PROBNP in the last 8760 hours. HbA1C: No results for input(s): HGBA1C in the last 72 hours. CBG: No results for input(s): GLUCAP in the last 168 hours. Lipid Profile: No results for input(s): CHOL, HDL, LDLCALC, TRIG, CHOLHDL, LDLDIRECT in the last 72 hours. Thyroid Function Tests: Recent Labs    08/31/20 0320  TSH 3.587   Anemia Panel: No results for input(s): VITAMINB12, FOLATE, FERRITIN, TIBC, IRON, RETICCTPCT in the last 72 hours. Sepsis Labs: Recent Labs  Lab 08/30/20 2040 08/31/20 0320  PROCALCITON <0.10 <0.10    Recent Results (from the past 240 hour(s))  SARS CORONAVIRUS 2 (TAT 6-24 HRS) Nasopharyngeal Nasopharyngeal  Swab     Status: None   Collection Time: 08/30/20 10:15 PM   Specimen: Nasopharyngeal Swab  Result Value Ref Range Status   SARS Coronavirus 2 NEGATIVE NEGATIVE Final    Comment: (NOTE) SARS-CoV-2 target nucleic acids are NOT DETECTED.  The SARS-CoV-2 RNA is generally detectable in upper and lower respiratory specimens during the acute phase of infection. Negative results do not preclude SARS-CoV-2 infection, do not rule out co-infections with other pathogens, and should not be used as the sole basis for treatment or other patient management decisions. Negative results must be combined with clinical observations, patient history,  and epidemiological information. The expected result is Negative.  Fact Sheet for Patients: SugarRoll.be  Fact Sheet for Healthcare Providers: https://www.woods-mathews.com/  This test is not yet approved or cleared by the [place] FDA and  has been authorized for detection and/or diagnosis of SARS-CoV-2 by FDA under an Emergency Use Authorization (EUA). This EUA will remain  in effect (meaning this test can be used) for the duration of the COVID-19 declaration under Se ction 564(b)(1) of the Act, 21 U.S.C. section 360bbb-3(b)(1), unless the authorization is terminated or revoked sooner.  Performed at [facility], [place] [address] [place], [place] [address]          Radiology Studies: DG Chest 2 View  Result Date: 08/30/2020 CLINICAL DATA:  Shortness of breath EXAM: CHEST - 2 VIEW COMPARISON:  03/27/2020 FINDINGS: Small left-sided pleural effusion with basilar airspace disease. Mild cardiomegaly with aortic atherosclerosis. Coarse chronic interstitial opacity. No pneumothorax. IMPRESSION: Small left pleural effusion with basilar atelectasis or pneumonia. Mild cardiomegaly. Electronically Signed   By: Donavan Foil M.D.   On: 08/30/2020 17:48   CT Angio Chest PE W and/or Wo Contrast  Result  Date: 08/30/2020 CLINICAL DATA:  Dyspnea EXAM: CT ANGIOGRAPHY CHEST WITH CONTRAST TECHNIQUE: Multidetector CT imaging of the chest was performed using the standard protocol during bolus administration of intravenous contrast. Multiplanar CT image reconstructions and MIPs were obtained to evaluate the vascular anatomy. CONTRAST:  38mL OMNIPAQUE IOHEXOL 350 MG/ML SOLN COMPARISON:  Chest x-ray 08/30/2020, CT chest 01/14/2020, CT chest 04/03/2018 FINDINGS: Cardiovascular: Satisfactory opacification of the pulmonary arteries to the segmental level. No evidence of pulmonary embolism. Extensive aortic atherosclerosis and irregular mural disease. No dissection is seen. Negative for aortic aneurysm. Coronary vascular calcification. Mild cardiomegaly with trace pericardial effusion. Mediastinum/Nodes: Midline trachea. No suspicious thyroid nodule. No significant mediastinal adenopathy. Esophagus within normal limits. Lungs/Pleura: Emphysema. Small left-sided pleural effusion, slightly increased compared to prior. Favor dependent atelectasis left lower lobe over pneumonia. Scattered areas of scarring within the bilateral upper lobes. Probable scarring in the right lower lobe. Upper Abdomen: No acute abnormality. Musculoskeletal: No chest wall abnormality. No acute or significant osseous findings. Review of the MIP images confirms the above findings. IMPRESSION: 1. Negative for acute pulmonary embolus or aortic dissection. 2. Emphysema. Small left-sided pleural effusion, slightly increased compared to prior. Partial consolidation left lower lobe, favor atelectasis over pneumonia. 3. Cardiomegaly with trace pericardial effusion. 4. Emphysema and aortic atherosclerosis. Aortic Atherosclerosis (ICD10-I70.0) and Emphysema (ICD10-J43.9). Electronically Signed   By: Donavan Foil M.D.   On: 08/30/2020 20:20        Scheduled Meds: . atorvastatin  80 mg Oral QPM  . enoxaparin (LOVENOX) injection  40 mg Subcutaneous Q24H  .  fluticasone furoate-vilanterol  1 puff Inhalation Daily  . furosemide  20 mg Intravenous BID  . hydrALAZINE  75 mg Oral TID  . latanoprost  1 drop Both Eyes QHS  . levothyroxine  75 mcg Oral Q0600  . potassium chloride  40 mEq Oral Once  . potassium chloride  40 mEq Oral Once  . predniSONE  5 mg Oral Q breakfast  . umeclidinium bromide  1 puff Inhalation Daily   Continuous Infusions:   LOS: 0 days    Time spent: 35 minutes.     Elmarie Shiley, MD [facility]   If 7PM-7AM, please contact night-coverage [email or web address]  08/31/2020, 7:37 AM

## 2020-08-31 NOTE — ED Notes (Addendum)
Pt is A-fib w/ BBB on monitor

## 2020-08-31 NOTE — Evaluation (Signed)
Physical Therapy Evaluation Patient Details Name: [name] MRN: [id number] DOB: 1924/07/28 Today's Date: 08/31/2020   History of Present Illness  Pt is a 87 y/o female admitted secondary to worsening SOB and fatigue. Found to have new onset a fib and L pleural effusion. PMH includes COPD, HTN, CAD, and lumbar stenosis.  Clinical Impression  Pt admitted secondary to problem above with deficits below. Pt requiring min guard to stand and take side steps at EOB. Pt reporting increased SOB, however, oxygen sats >90% on 2L. Pt also reporting increased L buttock pain. Given limitations, feel pt would benefit from HHPT and 24/7 assist initially to ensure safety at home. Pt reports she thought her family could provide. She is not wanting to go to SNF. Will continue to follow acutely.     Follow Up Recommendations Home health PT;Supervision/Assistance - 24 hour (24/7 initially; if pt does not have 24/7, may need to consider SNF.)    Equipment Recommendations  None recommended by PT    Recommendations for Other Services       Precautions / Restrictions Precautions Precautions: Fall Restrictions Weight Bearing Restrictions: No      Mobility  Bed Mobility Overal bed mobility: Needs Assistance Bed Mobility: Supine to Sit;Sit to Supine     Supine to sit: Min assist Sit to supine: Supervision   General bed mobility comments: Min A for trunk elevation to come to sitting.    Transfers Overall transfer level: Needs assistance Equipment used: 1 person hand held assist Transfers: Sit to/from Stand Sit to Stand: Min guard         General transfer comment: Min guard for safety to stand. Initially reporting dizziness, however, improved with second stand.  Ambulation/Gait Ambulation/Gait assistance: Min guard   Assistive device: 1 person hand held assist       General Gait Details: Took side steps at EOB. Pt with increased pain in L buttock which limited mobility. PT stood in  front of pt and had pt hold to PT arms.  Stairs            Wheelchair Mobility    Modified Rankin (Stroke Patients Only)       Balance Overall balance assessment: Needs assistance Sitting-balance support: No upper extremity supported;Feet supported Sitting balance-Leahy Scale: Good     Standing balance support: Bilateral upper extremity supported Standing balance-Leahy Scale: Poor Standing balance comment: Reliant on BUE support                             Pertinent Vitals/Pain Pain Assessment: 0-10 Pain Score: 6  Pain Location: L buttock Pain Descriptors / Indicators: Aching Pain Intervention(s): Limited activity within patient's tolerance;Monitored during session;Repositioned    Home Living Family/patient expects to be discharged to:: Private residence Living Arrangements: Alone Available Help at Discharge: Family;Available 24 hours/day Type of Home: House Home Access: Ramped entrance;Stairs to enter Entrance Stairs-Rails: Right Entrance Stairs-Number of Steps: 3 Home Layout: Multi-level Home Equipment: Shower seat;Walker - 2 wheels;Walker - 4 wheels;Transport chair;Cane - single point Additional Comments: Aide comes monday and wednesday for 3 hours    Prior Function Level of Independence: Needs assistance   Gait / Transfers Assistance Needed: Very short distance ambulation with RW. Uses transport chair when out of home.  ADL's / Homemaking Assistance Needed: Family does meal prep. Aide assists with bathing.        Hand Dominance        Extremity/Trunk Assessment  Upper Extremity Assessment Upper Extremity Assessment: Defer to OT evaluation    Lower Extremity Assessment Lower Extremity Assessment: LLE deficits/detail LLE Deficits / Details: Reports L buttock pain when standing and walking at baseline.    Cervical / Trunk Assessment Cervical / Trunk Assessment: Normal  Communication   Communication: No difficulties  Cognition  Arousal/Alertness: Awake/alert Behavior During Therapy: WFL for tasks assessed/performed Overall Cognitive Status: Within Functional Limits for tasks assessed                                        General Comments General comments (skin integrity, edema, etc.): Discussed need for 24/7 assist initially    Exercises     Assessment/Plan    PT Assessment Patient needs continued PT services  PT Problem List Decreased strength;Decreased activity tolerance;Decreased mobility;Decreased balance;Pain       PT Treatment Interventions Gait training;DME instruction;Therapeutic activities;Functional mobility training;Therapeutic exercise;Balance training;Patient/family education;Stair training    PT Goals (Current goals can be found in the Care Plan section)  Acute Rehab PT Goals Patient Stated Goal: to go home PT Goal Formulation: With patient Time For Goal Achievement: 09/14/20 Potential to Achieve Goals: Fair    Frequency Min 3X/week   Barriers to discharge        Co-evaluation               AM-PAC PT "6 Clicks" Mobility  Outcome Measure Help needed turning from your back to your side while in a flat bed without using bedrails?: A Little Help needed moving from lying on your back to sitting on the side of a flat bed without using bedrails?: A Little Help needed moving to and from a bed to a chair (including a wheelchair)?: A Little Help needed standing up from a chair using your arms (e.g., wheelchair or bedside chair)?: A Little Help needed to walk in hospital room?: A Little Help needed climbing 3-5 steps with a railing? : A Lot 6 Click Score: 17    End of Session Equipment Utilized During Treatment: Gait belt Activity Tolerance: Patient limited by pain;Patient limited by fatigue Patient left: in bed;with call bell/phone within reach (on stretcher in ED) Nurse Communication: Mobility status PT Visit Diagnosis: Unsteadiness on feet (R26.81);Muscle  weakness (generalized) (M62.81);Difficulty in walking, not elsewhere classified (R26.2);Pain Pain - Right/Left: Left Pain - part of body:  (buttock)    Time: 7078-6754 PT Time Calculation (min) (ACUTE ONLY): 18 min   Charges:   PT Evaluation $PT Eval Moderate Complexity: 1 Mod          Reuel Derby, PT, DPT  Acute Rehabilitation Services  Pager: [phone] Office: [phone]   Rudean Hitt 08/31/2020, 10:26 AM

## 2020-08-31 NOTE — Consult Note (Addendum)
Cardiology Consultation:   Patient ID: METHA KOLASA; [id number]; Apr 23, 1925   Admit date: 08/30/2020 Date of Consult: 08/31/2020  Primary Care Provider: [name], NP Primary Cardiologist: Jenkins Rouge, MD 09/07/2015 L. Servando Snare, NP  02/14/2020 Primary Electrophysiologist:  None   Patient Profile:   [name] is a 87 y.o. female with a hx of labile HTN (intol labetalol and clonidine), carotid dz s/p L CEA 2017, HTN, HLD, hypothyroid, CVA 2015, CKD III, OA w/ sciatica, hx GIB 2nd AVM when on Plavix, aortic arch atherosclerosis, COPD, AS, who is being seen today for the evaluation of CHF and Afib RVR, at the request of Dr Tyrell Antonio.  History of Present Illness:   [name] has generally been in her usual state of health recently.  Chronically, she gets short of breath walking short distances such as going from the kitchen to the living room.  Her daughter estimates that she never goes more than about 10 feet without stopping to rest.  She lives alone in a [place], but uses walkers and has a chair lift to help her retain her independence.  She states that she has problems with sciatica causing back and hip pain when she ambulates and that is part of the reason she has to stop so often.  However, the daughter notes that she is also short of breath.  Last p.m., she was watching TV and realized that she felt very short of breath.  She used her inhaler without any relief.  She used nebulizer without any relief.  When her COPD medications did not work, she came to the hospital by EMS.  Was started on oxygen, but her baseline O2 saturation was greater than 90%.    She was in atrial fibrillation, new diagnosis for her.  She had no palpitations, no awareness that her heart rate was fast or irregular.  In the ER, no heart rates are documented greater than 100.  The CT chest was negative for PE, but it did demonstrate a pleural effusion and area of atelectasis, concerning for CHF.   Her troponin was elevated but stable, BNP mildly elevated.  She was admitted and cardiology was asked to evaluate her.  In the ER, she got Lasix 40 mg IV x1 and was put on Lasix 20 mg IV twice daily, Lovenox 40 mg subcu every 12 hours, Ativan 1 mg, potassium supplementation, and other home medications.  She has spontaneously converted to sinus rhythm.  She feels like her breathing is back to baseline, but her baseline is poor.   Past Medical History:  Diagnosis Date  . Anxiety   . Aortic arch atherosclerosis ([place]) 06/24/2014  . Atherosclerotic ulcer of aorta ([place]) 06/24/2014  . Carotid artery occlusion   . Chronic kidney disease   . COPD (chronic obstructive pulmonary disease) ([place])   . [place] DISEASE, LUMBAR 12/16/2008  . DIVERTICULOSIS, COLON 09/30/2008  . DYSPNEA 07/13/2008  . Graves disease   . History of embolic stroke 2/50/5397   Left brain  . HYPERLIPIDEMIA 03/06/2007  . HYPERTENSION 03/06/2007  . HYPOTHYROIDISM 10/13/2007  . OSTEOARTHRITIS 03/06/2007  . Personal history of colonic polyps 09/30/2008  . Stroke ([place])    06/2014           . TOBACCO USE, QUIT 04/12/2009  . WEAKNESS 11/09/2007    Past Surgical History:  Procedure Laterality Date  . CARDIAC CATHETERIZATION    . CATARACT EXTRACTION Bilateral   . CHOLECYSTECTOMY    . ENDARTERECTOMY Left 11/13/2015  Procedure: LEFT CAROTID ARTERY ENDARTERECTOMY;  Surgeon: Conrad Sauk Centre, MD;  Location: [facility] OR;  Service: Vascular;  Laterality: Left;  . ESOPHAGOGASTRODUODENOSCOPY (EGD) WITH PROPOFOL N/A 10/11/2016   Procedure: ESOPHAGOGASTRODUODENOSCOPY (EGD) WITH PROPOFOL;  Surgeon: Mauri Pole, MD;  Location: [facility] ENDOSCOPY;  Service: Endoscopy;  Laterality: N/A;  . KNEE SURGERY    . PATCH ANGIOPLASTY Left 11/13/2015   Procedure: WITH 1CM X 6CM  XENOSURE BIOLOGIC PATCH ANGIOPLASTY;  Surgeon: Conrad Sobieski, MD;  Location: [facility];  Service: Vascular;  Laterality: Left;  . TEE WITHOUT CARDIOVERSION N/A 06/24/2014   Procedure: TRANSESOPHAGEAL  ECHOCARDIOGRAM (TEE);  Surgeon: Sanda Klein, MD;  Location: [facility] ENDOSCOPY;  Service: Cardiovascular;  Laterality: N/A;     Prior to Admission medications   Medication Sig Start Date End Date Taking? Authorizing Provider  albuterol (PROAIR HFA) 108 (90 Base) MCG/ACT inhaler Inhale 2 puffs into the lungs every 6 (six) hours as needed for wheezing or shortness of breath. 11/13/17  Yes Volanda Napoleon, PA-C  atorvastatin (LIPITOR) 80 MG tablet TAKE 1 TABLET BY MOUTH EVERY DAY AT 6PM Patient taking differently: Take 80 mg by mouth daily. 04/20/20  Yes Nafziger, Tommi Rumps, NP  hydrALAZINE (APRESOLINE) 25 MG tablet TAKE 3 TABLETS BY MOUTH 3 TIMES A DAY Patient taking differently: Take 75 mg by mouth 3 (three) times daily. 08/30/19  Yes Burtis Junes, NP  ipratropium-albuterol (DUONEB) 0.5-2.5 (3) MG/3ML SOLN Take 3 mLs by nebulization every 6 (six) hours as needed (copd). 01/16/20  Yes Danford, Suann Larry, MD  latanoprost (XALATAN) 0.005 % ophthalmic solution Place 1 drop into both eyes at bedtime.  04/02/19  Yes [provider]  levothyroxine (SYNTHROID) 75 MCG tablet Take 1 tablet (75 mcg total) by mouth daily before breakfast. 07/04/20  Yes Nafziger, Tommi Rumps, NP  predniSONE (DELTASONE) 5 MG tablet Take 5-10 mg by mouth daily with breakfast.   Yes [provider]  TRELEGY ELLIPTA 100-62.5-25 MCG/INH AEPB INHALE 1 [place] Patient taking differently: Inhale 1 puff into the lungs daily. 08/09/20  Yes Marshell Garfinkel, MD  Spacer/Aero-Holding Chambers (AEROCHAMBER MV) inhaler Use as instructed Patient taking differently: 1 each by Other route as directed. 11/26/17   Magdalen Spatz, NP    Inpatient Medications: Scheduled Meds: . atorvastatin  80 mg Oral QPM  . enoxaparin (LOVENOX) injection  40 mg Subcutaneous Q24H  . fluticasone furoate-vilanterol  1 puff Inhalation Daily  . furosemide  20 mg Intravenous BID  . hydrALAZINE  25 mg Oral TID  . latanoprost  1 drop Both Eyes  QHS  . levothyroxine  75 mcg Oral Q0600  . potassium chloride  40 mEq Oral Once  . predniSONE  5 mg Oral Q breakfast  . umeclidinium bromide  1 puff Inhalation Daily   Continuous Infusions:  PRN Meds: acetaminophen, hydrALAZINE, ipratropium-albuterol, ondansetron (ZOFRAN) IV, polyethylene glycol  Allergies:    Allergies  Allergen Reactions  . Catapres [Clonidine Hcl] Other (See Comments)    Made pt feel horrible, shaky, weak and nausea  . Lisinopril Anaphylaxis    Tongue swelling  . Labetalol Hcl Other (See Comments)    Caused bradycardia and syncope    Social History:   Social History   Socioeconomic History  . Marital status: Divorced    Spouse name: Not on file  . Number of children: 4  . Years of education: college  . Highest education level: Not on file  Occupational History  . Occupation: retired  Tobacco Use  .  Smoking status: Former Smoker    Packs/day: 1.50    Years: 29.00    Pack years: 43.50    Types: Cigarettes    Start date: 07/08/1949    Quit date: 07/08/1978    Years since quitting: 42.1  . Smokeless tobacco: Never Used  Vaping Use  . Vaping Use: Never used  Substance and Sexual Activity  . Alcohol use: Yes    Alcohol/week: 1.0 standard drink    Types: 1 Glasses of wine per week    Comment: "red wine"- some nights  . Drug use: No  . Sexual activity: Not on file  Other Topics Concern  . Not on file  Social History Narrative      Patient is right handed.   Patient drinks 1 cup caffeine daily.   Social Determinants of Health   Financial Resource Strain: Not on file  Food Insecurity: Not on file  Transportation Needs: Not on file  Physical Activity: Not on file  Stress: Not on file  Social Connections: Not on file  Intimate Partner Violence: Not on file    Family History:   Family History  Problem Relation Age of Onset  . Stomach cancer Maternal Grandmother   . Colon cancer Neg Hx    Family Status:  Family Status  Relation Name  Status  . Mother  Deceased at age 18       breast CA  . Father  Deceased at age 79       MI  . Brother  Alive  . MGM  Deceased  . MGF  Deceased  . PGM  Deceased  . PGF  Deceased  . Neg Hx  (Not Specified)    ROS:  Please see the history of present illness.  All other ROS reviewed and negative.     Physical Exam/Data:   Vitals:   08/31/20 0630 08/31/20 0645 08/31/20 0700 08/31/20 0900  BP:   (!) 118/54 (!) 130/52  Pulse: (!) 48 (!) 50 (!) 49 63  Resp: 20 18 19 18   Temp:      TempSrc:      SpO2: 100% 99% 100% 98%  Weight:      Height:        Intake/Output Summary (Last 24 hours) at 08/31/2020 1314 Last data filed at 08/31/2020 0200 Gross per 24 hour  Intake --  Output 2763 ml  Net -2763 ml    Last 3 Weights 08/30/2020 08/22/2020 08/08/2020  Weight (lbs) 133 lb 133 lb 128 lb  Weight (kg) 60.328 kg 60.328 kg 58.06 kg     Body mass index is 21.47 kg/m.   General: Frail, elderly, female in no acute distress HEENT: normal Lymph: no adenopathy Neck: JVD -9 cm Endocrine:  No thryomegaly Vascular: Bilateral carotid bruits; 4/4 extremity pulses 2+  Cardiac:  normal S1, S2; RRR; 2/6 murmur Lungs:  clear bilaterally, no wheezing, rhonchi or rales  Abd: soft, nontender, no hepatomegaly  Ext: no edema Musculoskeletal:  No deformities, BUE and BLE strength weak but equal Skin: warm and dry  Neuro:  CNs 2-12 intact, no focal abnormalities noted Psych:  Normal affect   EKG:  The EKG was personally reviewed and demonstrates: 2/23 ECG is read as atrial fibrillation, heart rate 87, interventricular conduction defect, QRS duration 119 ms Telemetry:  Telemetry was personally reviewed and demonstrates: Sinus rhythm with PACs   CV studies:   ECHO: Ordered  Echo:01/2018 Study Conclusions - Left ventricle: The cavity size was normal. There was  moderate concentric hypertrophy. Systolic function was normal. The estimated ejection fraction was in the range of 55% to 60%.  Wall motion was normal; there were no regional wall motion abnormalities. - Aortic valve: Valve mobility was mildly restricted. There was mild stenosis. There was trivial regurgitation. Valve area (VTI): 1.53 cm^2. Valve area (Vmean): 1.42 cm^2. - Atrial septum: There was increased thickness of the septum, consistent with lipomatous hypertrophy.  Impressions:  - Normal LV ejection fraction, mild aortic stenosis.   Nm Myocar Multi W/spect W/wall Motion / Ef 10/05/2015   There was no ST segment deviation noted during stress.  Defect 1: There is a medium defect of mild severity present in the mid anteroseptal, mid inferoseptal and apical septal location.  This is a low risk study.  The study is normal.  The left ventricular ejection fraction is normal (55-65%).  Nuclear stress EF: 57%. Low risk stress nuclear study with LBBB-related perfusion artifact, otherwise normal perfusion and normal left ventricular regional and global systolic function.   CTA Neck (10/09/15) Advanced atherosclerotic plaque of the thoracic aorta and distal aortic arch.  50% diameter stenosis proximal right internal carotid artery  Irregular plaque of the distal left common carotid artery with plaque ulceration. Very irregular plaque involving the proximal left internal carotid artery. Critical 85% stenosis of the proximal left internal carotid artery  Mild atherosclerotic disease in the right vertebral artery without significant vertebral artery stenosis.  Heterogeneous thyroid bilaterally with 11 mm right lower pole nodule. Recommend thyroid ultrasound.    Laboratory Data:   Chemistry Recent Labs  Lab 08/30/20 1657 08/30/20 1758 08/31/20 0320  NA 132* 133* 136  K 3.8 4.0 3.2*  CL 99  --  101  CO2 24  --  25  GLUCOSE 147*  --  102*  BUN 18  --  16  CREATININE 1.06*  --  1.17*  CALCIUM 8.7*  --  8.5*  GFRNONAA 49*  --  44*  ANIONGAP 9  --  10    Lab Results   Component Value Date   ALT 16 08/31/2020   AST 18 08/31/2020   ALKPHOS 69 08/31/2020   BILITOT 0.9 08/31/2020   Hematology Recent Labs  Lab 08/30/20 1657 08/30/20 1758  WBC 5.6  --   RBC 4.12  --   HGB 12.9 12.6  HCT 38.2 37.0  MCV 92.7  --   MCH 31.3  --   MCHC 33.8  --   RDW 13.2  --   PLT 176  --    Cardiac Enzymes High Sensitivity Troponin:   Recent Labs  Lab 08/30/20 1657 08/30/20 1857  TROPONINIHS 82* 73*      BNP Recent Labs  Lab 08/30/20 1657  BNP 348.6*    DDimer No results for input(s): DDIMER in the last 168 hours. TSH:  Lab Results  Component Value Date   TSH 3.587 08/31/2020   Lipids: Lab Results  Component Value Date   CHOL 218 (H) 03/24/2019   HDL 111 03/24/2019   LDLCALC 94 03/24/2019   LDLDIRECT 141.7 05/01/2011   TRIG 73 03/24/2019   CHOLHDL 2.0 03/24/2019   HgbA1c: Lab Results  Component Value Date   HGBA1C 6.1 (H) 01/15/2020   Magnesium:  Magnesium  Date Value Ref Range Status  08/31/2020 1.9 1.7 - 2.4 mg/dL Final    Comment:    Performed at [facility], [place] [address] [place], [place] [address]     Radiology/Studies:  DG Chest 2  View  Result Date: 08/30/2020 CLINICAL DATA:  Shortness of breath EXAM: CHEST - 2 VIEW COMPARISON:  03/27/2020 FINDINGS: Small left-sided pleural effusion with basilar airspace disease. Mild cardiomegaly with aortic atherosclerosis. Coarse chronic interstitial opacity. No pneumothorax. IMPRESSION: Small left pleural effusion with basilar atelectasis or pneumonia. Mild cardiomegaly. Electronically Signed   By: Donavan Foil M.D.   On: 08/30/2020 17:48   CT Angio Chest PE W and/or Wo Contrast  Result Date: 08/30/2020 CLINICAL DATA:  Dyspnea EXAM: CT ANGIOGRAPHY CHEST WITH CONTRAST TECHNIQUE: Multidetector CT imaging of the chest was performed using the standard protocol during bolus administration of intravenous contrast. Multiplanar CT image reconstructions and MIPs were obtained to  evaluate the vascular anatomy. CONTRAST:  53mL OMNIPAQUE IOHEXOL 350 MG/ML SOLN COMPARISON:  Chest x-ray 08/30/2020, CT chest 01/14/2020, CT chest 04/03/2018 FINDINGS: Cardiovascular: Satisfactory opacification of the pulmonary arteries to the segmental level. No evidence of pulmonary embolism. Extensive aortic atherosclerosis and irregular mural disease. No dissection is seen. Negative for aortic aneurysm. Coronary vascular calcification. Mild cardiomegaly with trace pericardial effusion. Mediastinum/Nodes: Midline trachea. No suspicious thyroid nodule. No significant mediastinal adenopathy. Esophagus within normal limits. Lungs/Pleura: Emphysema. Small left-sided pleural effusion, slightly increased compared to prior. Favor dependent atelectasis left lower lobe over pneumonia. Scattered areas of scarring within the bilateral upper lobes. Probable scarring in the right lower lobe. Upper Abdomen: No acute abnormality. Musculoskeletal: No chest wall abnormality. No acute or significant osseous findings. Review of the MIP images confirms the above findings. IMPRESSION: 1. Negative for acute pulmonary embolus or aortic dissection. 2. Emphysema. Small left-sided pleural effusion, slightly increased compared to prior. Partial consolidation left lower lobe, favor atelectasis over pneumonia. 3. Cardiomegaly with trace pericardial effusion. 4. Emphysema and aortic atherosclerosis. Aortic Atherosclerosis (ICD10-I70.0) and Emphysema (ICD10-J43.9). Electronically Signed   By: Donavan Foil M.D.   On: 08/30/2020 20:20    Assessment and Plan:   1.  New onset atrial fibrillation: -She is at high risk to have had this, since she has had a CVA in the past (though Dr Sallyanne Kuster thought the CVA was most likely from [facility] when he did the TEE). - however, upon ECG and telemetry review, it seems to be SR, with PVCs - no atrial fib seen ( although artifact makes some strips difficult to assess) - since no atrial fib, no  anticoagulation - had syncope after labetalol, so care with BBs - asymptomatic  2. HTN w/ hx labile HTN - at home, only takes hydralzine 75 mg tid. - follow BP w/ diuresis  3. Acute on chronic diastolic CHF - Got Lasix 40 mg IV x 1 last pm, now on 20 mg IV bid - do not think she will need much diuresis CXR/CT do not show extreme amount of fluid - BNP is also not very high.  4.  COPD: -She is on chronic steroids for this plus inhaler and nebulizers -The combination of the COPD and the chronic sciatica and back issues severely limits her activity at baseline. -Management per IM  5. Aortic stenosis - Echo is almost 87 years old, recheck  Otherwise, per IM Principal Problem:   New onset atrial fibrillation ([place]) Active Problems:   Hypothyroidism   Mixed hyperlipidemia   Essential hypertension   COPD with emphysema ([place])   Neurogenic claudication due to lumbar spinal stenosis   Mild aortic stenosis   Elevated troponin level not due myocardial infarction   Pleural effusion on left     For questions or updates, please contact  [facility] Please consult [email or web address] for contact info under Cardiology/STEMI.   SignedRosaria Ferries, PA-C  08/31/2020 1:14 PM

## 2020-08-31 NOTE — Progress Notes (Signed)
Occupational Therapy Evaluation  PTA pt lives at home alone with caregiver assisting with care Mon/Wednesday and family as needed. Pt with increased SOB and decreased activity tolerance. Daughter present for session and educated on need for initial S with all mobility and ADL/IADL tasks. Daughter states that can be arranged. Recommend follow up with [place]. Pt able to complete bed mobility, minimal ambulation and ADL task with SpO2 @ 92 on RA; HR 70; RR 27; BP 133/61. Will follow acutely.     08/31/20 1500  OT Visit Information  Last OT Received On 08/31/20  Assistance Needed +1 (chair follow may be helpful)  History of Present Illness Pt is a 87 y/o female admitted secondary to worsening SOB and fatigue. Found to have new onset a fib and L pleural effusion. PMH includes COPD, HTN, CAD, and lumbar stenosis.  Precautions  Precautions Fall  Home Living  Family/patient expects to be discharged to: Private residence  Living Arrangements Alone  Available Help at Discharge Family;Available 24 hours/day  Type of [place] entrance;Stairs to enter  Entrance Stairs-Number of Steps 3  Entrance Stairs-Rails Right  Home Layout Multi-level  Alternate Level Stairs-Number of Steps has chair lift  Bathroom Shower/Tub Walk-in shower  Bathroom Toilet Handicapped height  Bathroom Accessibility Yes  How Accessible Accessible via Paramedic - 2 wheels;Walker - 4 wheels;Transport chair;Cane - single point;Grab bars - tub/shower (stair lift)  Additional Comments Aide comes monday and wednesday for 3 hours  Prior Function  Level of [place] Needs assistance  Gait / Transfers Assistance Needed Very short distance ambulation with RW. Uses transport chair when out of home.  ADL's / Homemaking Assistance Needed Family does meal prep. Aide assists with bathing 2x/wk; pt dresses herself  Communication  Communication No difficulties  Pain Assessment  Pain  Assessment Faces  Faces Pain Scale 4  Pain Location L buttock  Pain Descriptors / Indicators Aching  Pain Intervention(s) Limited activity within patient's tolerance  Cognition  Arousal/Alertness Awake/alert  Behavior During Therapy WFL for tasks assessed/performed  Overall Cognitive Status Within Functional Limits for tasks assessed (most likely at baseline)  Upper Extremity Assessment  Upper Extremity Assessment Generalized weakness  Lower Extremity Assessment  Lower Extremity Assessment Defer to PT evaluation  LLE Deficits / Details Reports L buttock pain when standing and walking at baseline.  Cervical / Trunk Assessment  Cervical / Trunk Assessment Normal  ADL  Overall ADL's  Needs assistance/impaired  Grooming Set up;Sitting  Upper Body Bathing Set up;Sitting  Lower Body Bathing Minimal assistance;Sit to/from stand  Upper Body Dressing  Set up;Sitting  Lower Body Dressing Minimal assistance;Sit to/from Retail buyer Minimal assistance;RW  Toileting- Clothing Manipulation and Hygiene Moderate assistance  Toileting - Clothing Manipulation Details (indicate cue type and reason) purewick apparently not wroking and pt saturated in urine  Functional mobility during ADLs Min guard;Rolling walker  General ADL Comments SOB with minimal activity  Bed Mobility  Overal bed mobility Needs Assistance  Bed Mobility Supine to Sit;Sit to Supine  Supine to sit Min assist  Sit to supine Supervision  General bed mobility comments Min A for trunk elevation to come to sitting.  Transfers  Overall transfer level Needs assistance  Equipment used 1 person hand held assist  Transfers Sit to/from Stand  Sit to Stand Min guard  General transfer comment Min guard for safety to stand. Initially reporting dizziness, however, improved with second stand.  Balance  Overall balance  assessment Needs assistance  Sitting-balance support No upper extremity supported;Feet supported  Sitting  balance-Leahy Scale Good  Standing balance support Bilateral upper extremity supported  Standing balance-Leahy Scale Poor  Standing balance comment Reliant on BUE support  General Comments  General comments (skin integrity, edema, etc.) daughter present and assisted with self care - issued giat belt  OT - End of Session  Equipment Utilized During Treatment Gait belt;Rolling walker  Activity Tolerance Patient tolerated treatment well  Patient left in bed;with call bell/phone within reach;with family/visitor present  Nurse Communication Mobility status  OT Assessment  OT Recommendation/Assessment Patient needs continued OT Services  OT Visit Diagnosis Unsteadiness on feet (R26.81);Muscle weakness (generalized) (M62.81);Pain  Pain - part of body  (L butt area)  OT Problem List Decreased activity tolerance;Decreased strength;Impaired balance (sitting and/or standing);Decreased safety awareness;Decreased knowledge of use of DME or AE;Cardiopulmonary status limiting activity;Pain  OT Plan  OT Frequency (ACUTE ONLY) Min 2X/week  OT Treatment/Interventions (ACUTE ONLY) Self-care/ADL training;Therapeutic exercise;Energy conservation;DME and/or AE instruction;Therapeutic activities;Patient/family education;Balance training  AM-PAC OT "6 Clicks" Daily Activity Outcome Measure (Version 2)  Help from another person eating meals? 4  Help from another person taking care of personal grooming? 3  Help from another person toileting, which includes using toliet, bedpan, or urinal? 2  Help from another person bathing (including washing, rinsing, drying)? 3  Help from another person to put on and taking off regular upper body clothing? 3  Help from another person to put on and taking off regular lower body clothing? 3  6 Click Score 18  OT Recommendation  Follow Up Recommendations Home health OT;Supervision/Assistance - 24 hour  OT Equipment None recommended by OT  Individuals Consulted  Consulted and  Agree with Results and Recommendations Patient;Family member/caregiver  Family Member Consulted daughter  Acute Rehab OT Goals  Patient Stated Goal to go home  OT Goal Formulation With patient/family  Time For Goal Achievement 09/14/20  Potential to Achieve Goals Good  OT Time Calculation  OT Start Time (ACUTE ONLY) 1217  OT Stop Time (ACUTE ONLY) 1255  OT Time Calculation (min) 38 min  OT General Charges  $OT Visit 1 Visit  OT Evaluation  $OT Eval Moderate Complexity 1 Mod  OT Treatments  $Self Care/Home Management  8-22 mins  Written Expression  Dominant Hand Right  Maurie Boettcher, OT/L   Acute OT Clinical Specialist [place] Pager [phone] Office [phone]

## 2020-08-31 NOTE — Progress Notes (Signed)
  Echocardiogram 2D Echocardiogram has been performed.  Randa Lynn Dance 08/31/2020, 3:30 PM

## 2020-08-31 NOTE — ED Notes (Signed)
Pt c/o not being able to urinate. Pt has urinated 667mL in [place].  Bladder scan showed 949mL in bladder. MD notified, I/O performed.  1063mL of urine out. Post void residual bladder scan done, 141mL noted in bladder after I/O. Pt states she feels less full. Lower abdomen is no longer tender to palpation and has softened.

## 2020-08-31 NOTE — ED Notes (Signed)
Tele Breakfast order placed 

## 2020-09-01 DIAGNOSIS — N179 Acute kidney failure, unspecified: Secondary | ICD-10-CM | POA: Diagnosis not present

## 2020-09-01 DIAGNOSIS — I471 Supraventricular tachycardia, unspecified: Secondary | ICD-10-CM | POA: Diagnosis present

## 2020-09-01 DIAGNOSIS — I5041 Acute combined systolic (congestive) and diastolic (congestive) heart failure: Secondary | ICD-10-CM

## 2020-09-01 DIAGNOSIS — I130 Hypertensive heart and chronic kidney disease with heart failure and stage 1 through stage 4 chronic kidney disease, or unspecified chronic kidney disease: Secondary | ICD-10-CM | POA: Diagnosis present

## 2020-09-01 DIAGNOSIS — K5900 Constipation, unspecified: Secondary | ICD-10-CM | POA: Diagnosis not present

## 2020-09-01 DIAGNOSIS — I350 Nonrheumatic aortic (valve) stenosis: Secondary | ICD-10-CM | POA: Diagnosis not present

## 2020-09-01 DIAGNOSIS — T502X5A Adverse effect of carbonic-anhydrase inhibitors, benzothiadiazides and other diuretics, initial encounter: Secondary | ICD-10-CM | POA: Diagnosis not present

## 2020-09-01 DIAGNOSIS — R0602 Shortness of breath: Secondary | ICD-10-CM | POA: Diagnosis present

## 2020-09-01 DIAGNOSIS — E039 Hypothyroidism, unspecified: Secondary | ICD-10-CM | POA: Diagnosis present

## 2020-09-01 DIAGNOSIS — Z8673 Personal history of transient ischemic attack (TIA), and cerebral infarction without residual deficits: Secondary | ICD-10-CM | POA: Diagnosis not present

## 2020-09-01 DIAGNOSIS — M1990 Unspecified osteoarthritis, unspecified site: Secondary | ICD-10-CM | POA: Diagnosis present

## 2020-09-01 DIAGNOSIS — Z66 Do not resuscitate: Secondary | ICD-10-CM | POA: Diagnosis present

## 2020-09-01 DIAGNOSIS — E782 Mixed hyperlipidemia: Secondary | ICD-10-CM | POA: Diagnosis present

## 2020-09-01 DIAGNOSIS — I700 Atherosclerosis of aorta: Secondary | ICD-10-CM | POA: Diagnosis present

## 2020-09-01 DIAGNOSIS — M48062 Spinal stenosis, lumbar region with neurogenic claudication: Secondary | ICD-10-CM | POA: Diagnosis present

## 2020-09-01 DIAGNOSIS — Z20822 Contact with and (suspected) exposure to covid-19: Secondary | ICD-10-CM | POA: Diagnosis present

## 2020-09-01 DIAGNOSIS — J439 Emphysema, unspecified: Secondary | ICD-10-CM | POA: Diagnosis present

## 2020-09-01 DIAGNOSIS — I5021 Acute systolic (congestive) heart failure: Secondary | ICD-10-CM

## 2020-09-01 DIAGNOSIS — I5042 Chronic combined systolic (congestive) and diastolic (congestive) heart failure: Secondary | ICD-10-CM

## 2020-09-01 DIAGNOSIS — I5043 Acute on chronic combined systolic (congestive) and diastolic (congestive) heart failure: Secondary | ICD-10-CM | POA: Diagnosis present

## 2020-09-01 DIAGNOSIS — I10 Essential (primary) hypertension: Secondary | ICD-10-CM

## 2020-09-01 DIAGNOSIS — Z87891 Personal history of nicotine dependence: Secondary | ICD-10-CM | POA: Diagnosis not present

## 2020-09-01 DIAGNOSIS — R778 Other specified abnormalities of plasma proteins: Secondary | ICD-10-CM | POA: Diagnosis not present

## 2020-09-01 DIAGNOSIS — I480 Paroxysmal atrial fibrillation: Secondary | ICD-10-CM | POA: Diagnosis present

## 2020-09-01 DIAGNOSIS — R339 Retention of urine, unspecified: Secondary | ICD-10-CM | POA: Diagnosis not present

## 2020-09-01 DIAGNOSIS — I4891 Unspecified atrial fibrillation: Secondary | ICD-10-CM | POA: Diagnosis not present

## 2020-09-01 DIAGNOSIS — I248 Other forms of acute ischemic heart disease: Secondary | ICD-10-CM | POA: Diagnosis not present

## 2020-09-01 DIAGNOSIS — I429 Cardiomyopathy, unspecified: Secondary | ICD-10-CM | POA: Diagnosis present

## 2020-09-01 DIAGNOSIS — Z803 Family history of malignant neoplasm of breast: Secondary | ICD-10-CM | POA: Diagnosis not present

## 2020-09-01 DIAGNOSIS — I2510 Atherosclerotic heart disease of native coronary artery without angina pectoris: Secondary | ICD-10-CM | POA: Diagnosis present

## 2020-09-01 DIAGNOSIS — E876 Hypokalemia: Secondary | ICD-10-CM | POA: Diagnosis not present

## 2020-09-01 DIAGNOSIS — I352 Nonrheumatic aortic (valve) stenosis with insufficiency: Secondary | ICD-10-CM | POA: Diagnosis present

## 2020-09-01 DIAGNOSIS — Z800 Family history of malignant neoplasm of digestive organs: Secondary | ICD-10-CM | POA: Diagnosis not present

## 2020-09-01 HISTORY — DX: Chronic combined systolic (congestive) and diastolic (congestive) heart failure: I50.42

## 2020-09-01 LAB — MRSA PCR SCREENING: MRSA by PCR: NEGATIVE

## 2020-09-01 LAB — PROCALCITONIN: Procalcitonin: <0.1 ng/mL

## 2020-09-01 MED ORDER — ENOXAPARIN SODIUM 30 MG/0.3ML ~~LOC~~ SOLN
30.0000 mg | SUBCUTANEOUS | Status: DC
  Administered 2020-09-01 – 2020-09-03 (×3): 30 mg via SUBCUTANEOUS
  Filled 2020-09-01 (×3): qty 0.3

## 2020-09-01 MED ORDER — FUROSEMIDE 10 MG/ML IJ SOLN
40.0000 mg | Freq: Two times a day (BID) | INTRAMUSCULAR | Status: DC
  Administered 2020-09-01 – 2020-09-02 (×3): 40 mg via INTRAVENOUS
  Filled 2020-09-01 (×3): qty 4

## 2020-09-01 MED ORDER — ISOSORBIDE MONONITRATE ER 30 MG PO TB24
30.0000 mg | ORAL_TABLET | Freq: Every day | ORAL | Status: DC
  Administered 2020-09-01 – 2020-09-04 (×4): 30 mg via ORAL
  Filled 2020-09-01 (×4): qty 1

## 2020-09-01 MED ORDER — FUROSEMIDE 10 MG/ML IJ SOLN
20.0000 mg | Freq: Two times a day (BID) | INTRAMUSCULAR | Status: DC
  Administered 2020-09-01: 20 mg via INTRAVENOUS
  Filled 2020-09-01: qty 2

## 2020-09-01 MED ORDER — ASPIRIN 81 MG PO CHEW
81.0000 mg | CHEWABLE_TABLET | Freq: Once | ORAL | Status: AC
  Administered 2020-09-01: 81 mg via ORAL
  Filled 2020-09-01: qty 1

## 2020-09-01 MED ORDER — PANTOPRAZOLE SODIUM 40 MG PO TBEC
40.0000 mg | DELAYED_RELEASE_TABLET | Freq: Every day | ORAL | Status: DC
  Administered 2020-09-01 – 2020-09-04 (×4): 40 mg via ORAL
  Filled 2020-09-01 (×4): qty 1

## 2020-09-01 MED ORDER — FUROSEMIDE 10 MG/ML IJ SOLN: 20.0000 mg | Freq: Two times a day (BID) | INTRAMUSCULAR | Status: DC

## 2020-09-01 MED ORDER — BISACODYL 5 MG PO TBEC: 5.0000 mg | DELAYED_RELEASE_TABLET | Freq: Every day | ORAL | Status: DC | PRN

## 2020-09-01 MED ORDER — AMLODIPINE BESYLATE 5 MG PO TABS
5.0000 mg | ORAL_TABLET | Freq: Every day | ORAL | Status: DC
  Administered 2020-09-01 – 2020-09-02 (×2): 5 mg via ORAL
  Filled 2020-09-01 (×2): qty 1

## 2020-09-01 MED ORDER — SENNA 8.6 MG PO TABS
1.0000 | ORAL_TABLET | Freq: Two times a day (BID) | ORAL | Status: DC
  Administered 2020-09-03 – 2020-09-04 (×2): 8.6 mg via ORAL
  Filled 2020-09-01 (×6): qty 1

## 2020-09-01 MED ORDER — POTASSIUM CHLORIDE CRYS ER 20 MEQ PO TBCR
40.0000 meq | EXTENDED_RELEASE_TABLET | Freq: Two times a day (BID) | ORAL | Status: DC
  Administered 2020-09-01 – 2020-09-02 (×4): 40 meq via ORAL
  Filled 2020-09-01 (×3): qty 2

## 2020-09-01 NOTE — Progress Notes (Addendum)
Progress Note  Patient Name: BREUNNA NORDMANN Date of Encounter: 09/01/2020  [facility] Cardiologist: Jenkins Rouge, MD   Subjective   Feeling better.  Breathing is improving.   Inpatient Medications    Scheduled Meds: . amLODipine  5 mg Oral Daily  . aspirin  81 mg Oral Once  . atorvastatin  80 mg Oral QPM  . enoxaparin (LOVENOX) injection  40 mg Subcutaneous Q24H  . fluticasone furoate-vilanterol  1 puff Inhalation Daily  . furosemide  40 mg Intravenous BID  . hydrALAZINE  75 mg Oral TID  . isosorbide mononitrate  30 mg Oral Daily  . latanoprost  1 drop Both Eyes QHS  . levothyroxine  75 mcg Oral Q0600  . pantoprazole  40 mg Oral Daily  . potassium chloride  40 mEq Oral BID  . predniSONE  5 mg Oral Q breakfast  . umeclidinium bromide  1 puff Inhalation Daily   Continuous Infusions:  PRN Meds: acetaminophen, hydrALAZINE, ipratropium-albuterol, ondansetron (ZOFRAN) IV, polyethylene glycol   Vital Signs    Vitals:   08/31/20 2300 09/01/20 0400 09/01/20 0806 09/01/20 0842  BP: 132/61 (!) 150/55 (!) 149/65   Pulse: 72 69 69   Resp: (!) 22 20 18    Temp: 98.4 F (36.9 C) 98.3 F (36.8 C) 97.6 F (36.4 C)   TempSrc: Oral Oral Oral   SpO2: 93% 94% 96% 95%  Weight:      Height:        Intake/Output Summary (Last 24 hours) at 09/01/2020 1026 Last data filed at 09/01/2020 0600 Gross per 24 hour  Intake 120 ml  Output 900 ml  Net -780 ml   Last 3 Weights 08/30/2020 08/22/2020 08/08/2020  Weight (lbs) 133 lb 133 lb 128 lb  Weight (kg) 60.328 kg 60.328 kg 58.06 kg      Telemetry    Sinus arrhythmia.  Frequent PACs.- Personally Reviewed  ECG    08/30/20: - Personally Reviewed  Physical Exam   VS:  BP (!) 149/65 (BP Location: Right Arm)   Pulse 69   Temp 97.6 F (36.4 C) (Oral)   Resp 18   Ht 5\' 6"  (1.676 m)   Wt 60.3 kg   SpO2 95%   BMI 21.47 kg/m  , BMI Body mass index is 21.47 kg/m. GENERAL:  Well appearing HEENT: Pupils equal round and reactive,  fundi not visualized, oral mucosa unremarkable NECK:  No jugular venous distention, waveform within normal limits, carotid upstroke brisk and symmetric LUNGS:  Basilar crackles HEART:  Irregularly irregular.  PMI not displaced or sustained,S1 and S2 within normal limits, no S3, no S4, no clicks, no rubs, II/VI systolic murmur at LUSB ABD:  Flat, positive bowel sounds normal in frequency in pitch, no bruits, no rebound, no guarding, no midline pulsatile mass, no hepatomegaly, no splenomegaly EXT:  2 plus pulses throughout, no edema, no cyanosis no clubbing SKIN:  No rashes no nodules NEURO:  Cranial nerves II through XII grossly intact, motor grossly intact throughout PSYCH:  Cognitively intact, oriented to person place and time   Labs    High Sensitivity Troponin:   Recent Labs  Lab 08/30/20 1657 08/30/20 1857  TROPONINIHS 82* 73*      Chemistry Recent Labs  Lab 08/30/20 1657 08/30/20 1758 08/31/20 0320  NA 132* 133* 136  K 3.8 4.0 3.2*  CL 99  --  101  CO2 24  --  25  GLUCOSE 147*  --  102*  BUN 18  --  16  CREATININE 1.06*  --  1.17*  CALCIUM 8.7*  --  8.5*  PROT  --   --  4.9*  ALBUMIN  --   --  3.0*  AST  --   --  18  ALT  --   --  16  ALKPHOS  --   --  69  BILITOT  --   --  0.9  GFRNONAA 49*  --  44*  ANIONGAP 9  --  10     Hematology Recent Labs  Lab 08/30/20 1657 08/30/20 1758  WBC 5.6  --   RBC 4.12  --   HGB 12.9 12.6  HCT 38.2 37.0  MCV 92.7  --   MCH 31.3  --   MCHC 33.8  --   RDW 13.2  --   PLT 176  --     BNP Recent Labs  Lab 08/30/20 1657  BNP 348.6*     DDimer No results for input(s): DDIMER in the last 168 hours.   Radiology    DG Chest 2 View  Result Date: 08/30/2020 CLINICAL DATA:  Shortness of breath EXAM: CHEST - 2 VIEW COMPARISON:  03/27/2020 FINDINGS: Small left-sided pleural effusion with basilar airspace disease. Mild cardiomegaly with aortic atherosclerosis. Coarse chronic interstitial opacity. No pneumothorax.  IMPRESSION: Small left pleural effusion with basilar atelectasis or pneumonia. Mild cardiomegaly. Electronically Signed   By: Donavan Foil M.D.   On: 08/30/2020 17:48   CT Angio Chest PE W and/or Wo Contrast  Result Date: 08/30/2020 CLINICAL DATA:  Dyspnea EXAM: CT ANGIOGRAPHY CHEST WITH CONTRAST TECHNIQUE: Multidetector CT imaging of the chest was performed using the standard protocol during bolus administration of intravenous contrast. Multiplanar CT image reconstructions and MIPs were obtained to evaluate the vascular anatomy. CONTRAST:  4mL OMNIPAQUE IOHEXOL 350 MG/ML SOLN COMPARISON:  Chest x-ray 08/30/2020, CT chest 01/14/2020, CT chest 04/03/2018 FINDINGS: Cardiovascular: Satisfactory opacification of the pulmonary arteries to the segmental level. No evidence of pulmonary embolism. Extensive aortic atherosclerosis and irregular mural disease. No dissection is seen. Negative for aortic aneurysm. Coronary vascular calcification. Mild cardiomegaly with trace pericardial effusion. Mediastinum/Nodes: Midline trachea. No suspicious thyroid nodule. No significant mediastinal adenopathy. Esophagus within normal limits. Lungs/Pleura: Emphysema. Small left-sided pleural effusion, slightly increased compared to prior. Favor dependent atelectasis left lower lobe over pneumonia. Scattered areas of scarring within the bilateral upper lobes. Probable scarring in the right lower lobe. Upper Abdomen: No acute abnormality. Musculoskeletal: No chest wall abnormality. No acute or significant osseous findings. Review of the MIP images confirms the above findings. IMPRESSION: 1. Negative for acute pulmonary embolus or aortic dissection. 2. Emphysema. Small left-sided pleural effusion, slightly increased compared to prior. Partial consolidation left lower lobe, favor atelectasis over pneumonia. 3. Cardiomegaly with trace pericardial effusion. 4. Emphysema and aortic atherosclerosis. Aortic Atherosclerosis (ICD10-I70.0) and  Emphysema (ICD10-J43.9). Electronically Signed   By: Donavan Foil M.D.   On: 08/30/2020 20:20   ECHOCARDIOGRAM COMPLETE  Result Date: 08/31/2020    ECHOCARDIOGRAM REPORT   Patient Name:   MERRISSA GIACOBBE Date of Exam: 08/31/2020 Medical Rec #:  [id number]     Height:       66.0 in Accession #:    [id number]    Weight:       133.0 lb Date of Birth:  27-May-1925    BSA:          1.681 m Patient Age:    62 years      BP:  150/58 mmHg Patient Gender: F             HR:           77 bpm. Exam Location:  Inpatient Procedure: 2D Echo, Cardiac Doppler and Color Doppler Indications:    I48.0 Paroxysmal atrial fibrillation  History:        Patient has prior history of Echocardiogram examinations, most                 recent 01/13/2018. Stroke, Carotid Disease and COPD,                 Signs/Symptoms:Dyspnea; Risk Factors:Hypertension, Dyslipidemia                 and Former Smoker. Graves' Disease. CKD.  Sonographer:    Jonelle Sidle Dance Referring Phys: [id number] [place]  1. Left ventricular ejection fraction, by estimation, is 40 to 45%. The left ventricle has mildly decreased function. The left ventricle has no regional wall motion abnormalities. Left ventricular diastolic parameters are consistent with Grade I diastolic dysfunction (impaired relaxation).  2. Right ventricular systolic function is normal. The right ventricular size is normal.  3. Left atrial size was mildly dilated.  4. Moderate pleural effusion.  5. The mitral valve is normal in structure. Mild mitral valve regurgitation. No evidence of mitral stenosis.  6. Fixed right coronary cusp. The aortic valve is tricuspid. There is moderate calcification of the aortic valve. There is moderate thickening of the aortic valve. Aortic valve regurgitation is trivial. Moderate aortic valve stenosis. Aortic valve mean gradient measures 19.5 mmHg. Aortic valve Vmax measures 3.04 m/s.  7. The inferior vena cava is normal in size with greater than 50%  respiratory variability, suggesting right atrial pressure of 3 mmHg. Conclusion(s)/Recommendation(s): No intracardiac source of embolism detected on this transthoracic study. A transesophageal echocardiogram is recommended to exclude cardiac source of embolism if clinically indicated. FINDINGS  Left Ventricle: Left ventricular ejection fraction, by estimation, is 40 to 45%. The left ventricle has mildly decreased function. The left ventricle has no regional wall motion abnormalities. The left ventricular internal cavity size was normal in size. There is no left ventricular hypertrophy. Left ventricular diastolic parameters are consistent with Grade I diastolic dysfunction (impaired relaxation).  LV Wall Scoring: The inferior wall is akinetic. Right Ventricle: The right ventricular size is normal. No increase in right ventricular wall thickness. Right ventricular systolic function is normal. Left Atrium: Left atrial size was mildly dilated. Right Atrium: Right atrial size was normal in size. Pericardium: There is no evidence of pericardial effusion. Mitral Valve: The mitral valve is normal in structure. Mild mitral valve regurgitation. No evidence of mitral valve stenosis. Tricuspid Valve: The tricuspid valve is normal in structure. Tricuspid valve regurgitation is not demonstrated. No evidence of tricuspid stenosis. Aortic Valve: Fixed right coronary cusp. The aortic valve is tricuspid. There is moderate calcification of the aortic valve. There is moderate thickening of the aortic valve. Aortic valve regurgitation is trivial. Aortic regurgitation PHT measures 321 msec. Moderate aortic stenosis is present. Aortic valve mean gradient measures 19.5 mmHg. Aortic valve peak gradient measures 36.8 mmHg. Aortic valve area, by VTI measures 0.78 cm. Pulmonic Valve: The pulmonic valve was normal in structure. Pulmonic valve regurgitation is not visualized. No evidence of pulmonic stenosis. Aorta: The aortic root is normal in  size and structure. Venous: The inferior vena cava is normal in size with greater than 50% respiratory variability, suggesting right atrial pressure of 3  mmHg. IAS/Shunts: No atrial level shunt detected by color flow Doppler. Additional Comments: There is a moderate pleural effusion.  LEFT VENTRICLE PLAX 2D LVIDd:         4.00 cm LVIDs:         3.50 cm LV PW:         1.00 cm LV IVS:        0.90 cm LVOT diam:     1.90 cm LV SV:         50 LV SV Index:   30 LVOT Area:     2.84 cm  RIGHT VENTRICLE             IVC RV Basal diam:  2.70 cm     IVC diam: 1.90 cm RV S prime:     12.90 cm/s TAPSE (M-mode): 1.9 cm LEFT ATRIUM             Index       RIGHT ATRIUM           Index LA diam:        3.50 cm 2.08 cm/m  RA Area:     12.10 cm LA Vol (A2C):   61.2 ml 36.40 ml/m RA Volume:   26.60 ml  15.82 ml/m LA Vol (A4C):   57.6 ml 34.26 ml/m LA Biplane Vol: 57.7 ml 34.32 ml/m  AORTIC VALVE AV Area (Vmax):    0.87 cm AV Area (Vmean):   0.84 cm AV Area (VTI):     0.78 cm AV Vmax:           303.50 cm/s AV Vmean:          203.750 cm/s AV VTI:            0.641 m AV Peak Grad:      36.8 mmHg AV Mean Grad:      19.5 mmHg LVOT Vmax:         93.30 cm/s LVOT Vmean:        60.600 cm/s LVOT VTI:          0.176 m LVOT/AV VTI ratio: 0.27 AI PHT:            321 msec  AORTA Ao Root diam: 2.90 cm Ao Asc diam:  3.00 cm MITRAL VALVE MV Area (PHT): 2.48 cm     SHUNTS MV Decel Time: 306 msec     Systemic VTI:  0.18 m MV E velocity: 59.00 cm/s   Systemic Diam: 1.90 cm MV A velocity: 118.00 cm/s MV E/A ratio:  0.50 Candee Furbish MD Electronically signed by Candee Furbish MD Signature Date/Time: 08/31/2020/4:26:40 PM    Final     Cardiac Studies   Echo 08/31/20: 1. Left ventricular ejection fraction, by estimation, is 40 to 45%. The  left ventricle has mildly decreased function. The left ventricle has no  regional wall motion abnormalities. Left ventricular diastolic parameters  are consistent with Grade I  diastolic dysfunction (impaired  relaxation).  2. Right ventricular systolic function is normal. The right ventricular  size is normal.  3. Left atrial size was mildly dilated.  4. Moderate pleural effusion.  5. The mitral valve is normal in structure. Mild mitral valve  regurgitation. No evidence of mitral stenosis.  6. Fixed right coronary cusp. The aortic valve is tricuspid. There is  moderate calcification of the aortic valve. There is moderate thickening  of the aortic valve. Aortic valve regurgitation is trivial. Moderate  aortic valve stenosis. Aortic valve mean  gradient measures 19.5 mmHg. Aortic valve Vmax measures 3.04 m/s.  7. The inferior vena cava is normal in size with greater than 50%  respiratory variability, suggesting right atrial pressure of 3 mmHg.   Patient Profile     Ms. Kissinger is a 36F with hypertension, carotid stenosis s/p L CEA, severe aortic atherosclerosis, mild aortic stenosis, hypertension, hyperlipidemia, COPD, CKD, and prior GI bleed 2/2 AVMs admitted with progressive dyspnea   Assessment & Plan    # Acute systolic and diastolic heart failure:  LVEF this admission is 40 to 45% down from 55% when last assessed.  She has not had any chest pain.  Her functional status is limited and she has had issues with GI bleeding in the past.  Discussed with her and her daughter and they prefer conservative approach.  We will treat underlying CAD with adding aspirin to her regimen.  She has not tolerated beta-blockers in the past due to bradycardia and syncope.  She had anaphylaxis with an ACE inhibitor.  Therefore no ACE-I or ARB.  Continue hydralazine and we will add Imdur 30 mg to her regimen.  She has crackles on exam.  Increase Lasix to 40 mg IV twice daily.  # Demand ischemia: # CAD: Troponin minimally elevated.  No ischemic changes on EKG.  I personally reviewed her CT PE protocol and she has extensive calcification in the coronaries and aorta.  Adding aspirin and Imdur as above.  No plans  for invasive angiography.  No beta-blocker as above.  Given her history of GI bleeding we will add a PPI.  # Moderate aortic stenosis:  Moderate aortic stenosis noted on echo this admission.  Mean gradient is 19 mmHg.  Continue to monitor and diuresis as above.  #Sinus arrhythmia and PACs: Asymptomatic.  No evidence of atrial fibrillation.     For questions or updates, please contact [place] Please consult [email or web address] for contact info under        Signed, Skeet Latch, MD  09/01/2020, 10:26 AM

## 2020-09-01 NOTE — Progress Notes (Signed)
PROGRESS NOTE    [name]  LNL:[id number] DOB: Sep 22, 1924 DOA: 08/30/2020 PCP: [name], NP   Brief Narrative: 87 year old with past medical history significant for COPD, hypertension, CAD, lumbar spinal stenosis, mild aortic stenosis, hypothyroidism who presents to [facility] complaining of shortness of breath.  She reports shortness of breath on exertion, unable to identify for how long she has been experiencing these symptoms.  She does report 3 to 4 days history of generalized malaise and fatigue.  Developed worsening shortness of breath today while watching TV.  EMS was contacted and patient was found to be in A. Fib.  Evaluation in the ED: Chest x-ray revealed left-sided pleural effusion with associated atelectasis of the left lower lobe.  CT angio was negative for PE with evidence of emphysema and left-sided pleural effusion.  Troponin mildly at elevated at 82   Assessment & Plan:   Active Problems:   Hypothyroidism   Mixed hyperlipidemia   Essential hypertension   COPD with emphysema (HCC)   Neurogenic claudication due to lumbar spinal stenosis   Mild aortic stenosis   Elevated troponin   Pleural effusion on left  1-Sinus Arrhythmia with PAC:  -Cardiology consulted, per their review no A fib.  -ECHO; E 45 %, grade one diastolic dysfunction. Moderate pleural effusion. Moderate Aortic Valve stenosis,  -CT angio negative for PE. -Mild elevation of troponin.   2-Acute combine Systolic and  diastolic heart failure exacerbation: Presented with SOB, BNP 348, mild elevation troponin. Chest X ray: Small left pleural effusion with basilar atelectasis or pneumonia. Mild cardiomegaly. Plan to increase IV lasix to 40 mg IV BID.  Cardiology consulted.  Negative 3.8  L. Urine out put 2.7 L.  New decrease EF 45 %, plan to treat medically. No plan for cath.  Started on Imdur. Continue with hydralazine.   3-Hypothyroidism: Continue with Synthroid.    4-Hyperlipidemia: Continue with Lipitor.   5-Hypertension: Continue with hydralazine. Started on norvasc.   6-COPD: No significant evidence of exacerbation.  Continue with low dose prednisone, Incruse, Duoneb PRN.   Neurogenic claudication due to lumbar spinal stenosis: PT, consult.  Tylenol PRN pain.   Mild elevation of troponin: Suspect related to HF.  Pleural effusion on the left; Continue with IV lasix.   Mild aortic stenosis: ECHO moderate aortic stenosis by ECHO.  Diuresis.   Hypokalemia: Replete orally 40 meq BID while on lasix.   Urinary retention: Had In and out cath last night 800 and 1 L out put.  Monitor urine out put.   Constipation; start senna, received mira lax.     Estimated body mass index is 21.47 kg/m as calculated from the following:   Height as of this encounter: 5\' 6"  (1.676 m).   Weight as of this encounter: 60.3 kg.   DVT prophylaxis: Lovenox Code Status: DNR Family Communication:  Disposition Plan:  Status EY:[id number].   The patient will require care spanning > 2 midnights and should be moved to inpatient because: IV treatments appropriate due to intensity of illness or inability to take PO  Dispo: The patient is from: Home              Anticipated d/c is to: SNF              Anticipated d/c date is: 2 days              Patient currently is not medically stable to d/c.   Difficult to place patient No  Consultants:   Cardiology   Procedures:   ECHO  Antimicrobials:    Subjective: She is breathing better, had SOB on exertion.  She is not at baseline.  She report constipation.    Objective: Vitals:   08/31/20 1621 08/31/20 2000 08/31/20 2300 09/01/20 0400  BP:  (!) 126/57 132/61 (!) 150/55  Pulse:  85 72 69  Resp:  20 (!) 22 20  Temp: 97.7 F (36.5 C) 98.1 F (36.7 C) 98.4 F (36.9 C) 98.3 F (36.8 C)  TempSrc: Oral Oral Oral Oral  SpO2:  94% 93% 94%  Weight:      Height:         Intake/Output Summary (Last 24 hours) at 09/01/2020 0735 Last data filed at 09/01/2020 0600 Gross per 24 hour  Intake 120 ml  Output 900 ml  Net -780 ml   Filed Weights   08/30/20 1655  Weight: 60.3 kg    Examination:  General exam: NAD Respiratory system: B/L crackles.  Cardiovascular system: S 1, S 2 RRR Gastrointestinal system: BS present, soft, nt Central nervous system: alert Extremities: trace edema    Data Reviewed: I have personally reviewed following labs and imaging studies  CBC: Recent Labs  Lab 08/30/20 1657 08/30/20 1758  WBC 5.6  --   NEUTROABS 4.3  --   HGB 12.9 12.6  HCT 38.2 37.0  MCV 92.7  --   PLT 176  --    Basic Metabolic Panel: Recent Labs  Lab 08/30/20 1657 08/30/20 1758 08/31/20 0320  NA 132* 133* 136  K 3.8 4.0 3.2*  CL 99  --  101  CO2 24  --  25  GLUCOSE 147*  --  102*  BUN 18  --  16  CREATININE 1.06*  --  1.17*  CALCIUM 8.7*  --  8.5*  MG 2.1  --  1.9   GFR: Estimated Creatinine Clearance: 28.1 mL/min (A) (by C-G formula based on SCr of 1.17 mg/dL (H)). Liver Function Tests: Recent Labs  Lab 08/31/20 0320  AST 18  ALT 16  ALKPHOS 69  BILITOT 0.9  PROT 4.9*  ALBUMIN 3.0*   No results for input(s): LIPASE, AMYLASE in the last 168 hours. No results for input(s): AMMONIA in the last 168 hours. Coagulation Profile: Recent Labs  Lab 08/31/20 0320  INR 1.1   Cardiac Enzymes: No results for input(s): CKTOTAL, CKMB, CKMBINDEX, TROPONINI in the last 168 hours. BNP (last 3 results) No results for input(s): PROBNP in the last 8760 hours. HbA1C: No results for input(s): HGBA1C in the last 72 hours. CBG: No results for input(s): GLUCAP in the last 168 hours. Lipid Profile: No results for input(s): CHOL, HDL, LDLCALC, TRIG, CHOLHDL, LDLDIRECT in the last 72 hours. Thyroid Function Tests: Recent Labs    08/31/20 0320  TSH 3.587   Anemia Panel: No results for input(s): VITAMINB12, FOLATE, FERRITIN, TIBC, IRON,  RETICCTPCT in the last 72 hours. Sepsis Labs: Recent Labs  Lab 08/30/20 2040 08/31/20 0320 09/01/20 0012  PROCALCITON <0.10 <0.10 <0.10    Recent Results (from the past 240 hour(s))  Blood culture (routine x 2)     Status: None (Preliminary result)   Collection Time: 08/30/20  8:40 PM   Specimen: BLOOD RIGHT ARM  Result Value Ref Range Status   Specimen Description BLOOD RIGHT ARM  Final   Special Requests   Final    BOTTLES DRAWN AEROBIC AND ANAEROBIC Blood Culture adequate volume   Culture   Final  NO GROWTH < 24 HOURS Performed at [facility] [address] [place], [place] [address]    Report Status PENDING  Incomplete  Blood culture (routine x 2)     Status: None (Preliminary result)   Collection Time: 08/30/20  8:45 PM   Specimen: BLOOD RIGHT HAND  Result Value Ref Range Status   Specimen Description BLOOD RIGHT HAND  Final   Special Requests   Final    BOTTLES DRAWN AEROBIC AND ANAEROBIC Blood Culture adequate volume   Culture   Final    NO GROWTH < 24 HOURS Performed at [facility], [place] [address], [place], [place] [address]    Report Status PENDING  Incomplete  SARS CORONAVIRUS 2 (TAT 6-24 HRS) Nasopharyngeal Nasopharyngeal Swab     Status: None   Collection Time: 08/30/20 10:15 PM   Specimen: Nasopharyngeal Swab  Result Value Ref Range Status   SARS Coronavirus 2 NEGATIVE NEGATIVE Final    Comment: (NOTE) SARS-CoV-2 target nucleic acids are NOT DETECTED.  The SARS-CoV-2 RNA is generally detectable in upper and lower respiratory specimens during the acute phase of infection. Negative results do not preclude SARS-CoV-2 infection, do not rule out co-infections with other pathogens, and should not be used as the sole basis for treatment or other patient management decisions. Negative results must be combined with clinical observations, patient history, and epidemiological information. The expected result is Negative.  Fact Sheet for  Patients: SugarRoll.be  Fact Sheet for Healthcare Providers: https://www.woods-mathews.com/  This test is not yet approved or cleared by the [place] FDA and  has been authorized for detection and/or diagnosis of SARS-CoV-2 by FDA under an Emergency Use Authorization (EUA). This EUA will remain  in effect (meaning this test can be used) for the duration of the COVID-19 declaration under Se ction 564(b)(1) of the Act, 21 U.S.C. section 360bbb-3(b)(1), unless the authorization is terminated or revoked sooner.  Performed at [facility], [place] [address], [place], [place] [address]          Radiology Studies: DG Chest 2 View  Result Date: 08/30/2020 CLINICAL DATA:  Shortness of breath EXAM: CHEST - 2 VIEW COMPARISON:  03/27/2020 FINDINGS: Small left-sided pleural effusion with basilar airspace disease. Mild cardiomegaly with aortic atherosclerosis. Coarse chronic interstitial opacity. No pneumothorax. IMPRESSION: Small left pleural effusion with basilar atelectasis or pneumonia. Mild cardiomegaly. Electronically Signed   By: Donavan Foil M.D.   On: 08/30/2020 17:48   CT Angio Chest PE W and/or Wo Contrast  Result Date: 08/30/2020 CLINICAL DATA:  Dyspnea EXAM: CT ANGIOGRAPHY CHEST WITH CONTRAST TECHNIQUE: Multidetector CT imaging of the chest was performed using the standard protocol during bolus administration of intravenous contrast. Multiplanar CT image reconstructions and MIPs were obtained to evaluate the vascular anatomy. CONTRAST:  8mL OMNIPAQUE IOHEXOL 350 MG/ML SOLN COMPARISON:  Chest x-ray 08/30/2020, CT chest 01/14/2020, CT chest 04/03/2018 FINDINGS: Cardiovascular: Satisfactory opacification of the pulmonary arteries to the segmental level. No evidence of pulmonary embolism. Extensive aortic atherosclerosis and irregular mural disease. No dissection is seen. Negative for aortic aneurysm. Coronary vascular calcification. Mild  cardiomegaly with trace pericardial effusion. Mediastinum/Nodes: Midline trachea. No suspicious thyroid nodule. No significant mediastinal adenopathy. Esophagus within normal limits. Lungs/Pleura: Emphysema. Small left-sided pleural effusion, slightly increased compared to prior. Favor dependent atelectasis left lower lobe over pneumonia. Scattered areas of scarring within the bilateral upper lobes. Probable scarring in the right lower lobe. Upper Abdomen: No acute abnormality. Musculoskeletal: No chest wall  abnormality. No acute or significant osseous findings. Review of the MIP images confirms the above findings. IMPRESSION: 1. Negative for acute pulmonary embolus or aortic dissection. 2. Emphysema. Small left-sided pleural effusion, slightly increased compared to prior. Partial consolidation left lower lobe, favor atelectasis over pneumonia. 3. Cardiomegaly with trace pericardial effusion. 4. Emphysema and aortic atherosclerosis. Aortic Atherosclerosis (ICD10-I70.0) and Emphysema (ICD10-J43.9). Electronically Signed   By: Donavan Foil M.D.   On: 08/30/2020 20:20   ECHOCARDIOGRAM COMPLETE  Result Date: 08/31/2020    ECHOCARDIOGRAM REPORT   Patient Name:   [name] Date of Exam: 08/31/2020 Medical Rec #:  [id number]     Height:       66.0 in Accession #:    [id number]    Weight:       133.0 lb Date of Birth:  1924-08-13    BSA:          1.681 m Patient Age:    66 years      BP:           150/58 mmHg Patient Gender: F             HR:           77 bpm. Exam Location:  Inpatient Procedure: 2D Echo, Cardiac Doppler and Color Doppler Indications:    I48.0 Paroxysmal atrial fibrillation  History:        Patient has prior history of Echocardiogram examinations, most                 recent 01/13/2018. Stroke, Carotid Disease and COPD,                 Signs/Symptoms:Dyspnea; Risk Factors:Hypertension, Dyslipidemia                 and Former Smoker. Graves' Disease. CKD.  Sonographer:    Jonelle Sidle Dance Referring Phys:  [id number] [place]  1. Left ventricular ejection fraction, by estimation, is 40 to 45%. The left ventricle has mildly decreased function. The left ventricle has no regional wall motion abnormalities. Left ventricular diastolic parameters are consistent with Grade I diastolic dysfunction (impaired relaxation).  2. Right ventricular systolic function is normal. The right ventricular size is normal.  3. Left atrial size was mildly dilated.  4. Moderate pleural effusion.  5. The mitral valve is normal in structure. Mild mitral valve regurgitation. No evidence of mitral stenosis.  6. Fixed right coronary cusp. The aortic valve is tricuspid. There is moderate calcification of the aortic valve. There is moderate thickening of the aortic valve. Aortic valve regurgitation is trivial. Moderate aortic valve stenosis. Aortic valve mean gradient measures 19.5 mmHg. Aortic valve Vmax measures 3.04 m/s.  7. The inferior vena cava is normal in size with greater than 50% respiratory variability, suggesting right atrial pressure of 3 mmHg. Conclusion(s)/Recommendation(s): No intracardiac source of embolism detected on this transthoracic study. A transesophageal echocardiogram is recommended to exclude cardiac source of embolism if clinically indicated. FINDINGS  Left Ventricle: Left ventricular ejection fraction, by estimation, is 40 to 45%. The left ventricle has mildly decreased function. The left ventricle has no regional wall motion abnormalities. The left ventricular internal cavity size was normal in size. There is no left ventricular hypertrophy. Left ventricular diastolic parameters are consistent with Grade I diastolic dysfunction (impaired relaxation).  LV Wall Scoring: The inferior wall is akinetic. Right Ventricle: The right ventricular size is normal. No increase in right ventricular wall thickness. Right ventricular systolic function is  normal. Left Atrium: Left atrial size was mildly dilated. Right  Atrium: Right atrial size was normal in size. Pericardium: There is no evidence of pericardial effusion. Mitral Valve: The mitral valve is normal in structure. Mild mitral valve regurgitation. No evidence of mitral valve stenosis. Tricuspid Valve: The tricuspid valve is normal in structure. Tricuspid valve regurgitation is not demonstrated. No evidence of tricuspid stenosis. Aortic Valve: Fixed right coronary cusp. The aortic valve is tricuspid. There is moderate calcification of the aortic valve. There is moderate thickening of the aortic valve. Aortic valve regurgitation is trivial. Aortic regurgitation PHT measures 321 msec. Moderate aortic stenosis is present. Aortic valve mean gradient measures 19.5 mmHg. Aortic valve peak gradient measures 36.8 mmHg. Aortic valve area, by VTI measures 0.78 cm. Pulmonic Valve: The pulmonic valve was normal in structure. Pulmonic valve regurgitation is not visualized. No evidence of pulmonic stenosis. Aorta: The aortic root is normal in size and structure. Venous: The inferior vena cava is normal in size with greater than 50% respiratory variability, suggesting right atrial pressure of 3 mmHg. IAS/Shunts: No atrial level shunt detected by color flow Doppler. Additional Comments: There is a moderate pleural effusion.  LEFT VENTRICLE PLAX 2D LVIDd:         4.00 cm LVIDs:         3.50 cm LV PW:         1.00 cm LV IVS:        0.90 cm LVOT diam:     1.90 cm LV SV:         50 LV SV Index:   30 LVOT Area:     2.84 cm  RIGHT VENTRICLE             IVC RV Basal diam:  2.70 cm     IVC diam: 1.90 cm RV S prime:     12.90 cm/s TAPSE (M-mode): 1.9 cm LEFT ATRIUM             Index       RIGHT ATRIUM           Index LA diam:        3.50 cm 2.08 cm/m  RA Area:     12.10 cm LA Vol (A2C):   61.2 ml 36.40 ml/m RA Volume:   26.60 ml  15.82 ml/m LA Vol (A4C):   57.6 ml 34.26 ml/m LA Biplane Vol: 57.7 ml 34.32 ml/m  AORTIC VALVE AV Area (Vmax):    0.87 cm AV Area (Vmean):   0.84 cm AV Area  (VTI):     0.78 cm AV Vmax:           303.50 cm/s AV Vmean:          203.750 cm/s AV VTI:            0.641 m AV Peak Grad:      36.8 mmHg AV Mean Grad:      19.5 mmHg LVOT Vmax:         93.30 cm/s LVOT Vmean:        60.600 cm/s LVOT VTI:          0.176 m LVOT/AV VTI ratio: 0.27 AI PHT:            321 msec  AORTA Ao Root diam: 2.90 cm Ao Asc diam:  3.00 cm MITRAL VALVE MV Area (PHT): 2.48 cm     SHUNTS MV Decel Time: 306 msec     Systemic VTI:  0.18  m MV E velocity: 59.00 cm/s   Systemic Diam: 1.90 cm MV A velocity: 118.00 cm/s MV E/A ratio:  0.50 Candee Furbish MD Electronically signed by Candee Furbish MD Signature Date/Time: 08/31/2020/4:26:40 PM    Final         Scheduled Meds: . atorvastatin  80 mg Oral QPM  . enoxaparin (LOVENOX) injection  40 mg Subcutaneous Q24H  . fluticasone furoate-vilanterol  1 puff Inhalation Daily  . hydrALAZINE  75 mg Oral TID  . latanoprost  1 drop Both Eyes QHS  . levothyroxine  75 mcg Oral Q0600  . predniSONE  5 mg Oral Q breakfast  . umeclidinium bromide  1 puff Inhalation Daily   Continuous Infusions:   LOS: 0 days    Time spent: 35 minutes.     Elmarie Shiley, MD [facility]   If 7PM-7AM, please contact night-coverage [email or web address]  09/01/2020, 7:35 AM

## 2020-09-01 NOTE — [place] FL2 (Signed)
[place] MEDICAID FL2 LEVEL OF CARE SCREENING TOOL     IDENTIFICATION  Patient Name: [name] Birthdate: 09/14/1924 Sex: female Admission Date (Current Location): 08/30/2020  [facility] and [place] Number:  Herbalist and Address:  [facility] [place], [place] [address] [place], [place] [address]      Provider Number: [id number]  Attending Physician Name and Address:  Elmarie Shiley, MD  Relative Name and Phone Number:  Jose Persia (Daughter)   [phone] [facility])    Current Level of Care: Hospital Recommended Level of Care: [place] Prior Approval Number:    Date Approved/Denied:   PASRR Number: [id number]  Discharge Plan: SNF    Current Diagnoses: Patient Active Problem List   Diagnosis Date Noted  . Atrial fibrillation ([place]) 09/01/2020  . Acute respiratory failure ([place])   . Mild aortic stenosis 08/30/2020  . Elevated troponin 08/30/2020  . Pleural effusion on left 08/30/2020  . Pain in joint of left knee 04/10/2020  . D-dimer, elevated 01/14/2020  . Elevated brain natriuretic peptide (BNP) level 01/14/2020  . Advice given about COVID-19 virus infection 07/21/2019  . Current chronic use of systemic steroids 07/13/2019  . Medication management 04/02/2018  . COPD exacerbation ([place]) 03/31/2018  . Hypokalemia 03/30/2018  . COPD with acute exacerbation ([place]) 03/02/2018  . SOB (shortness of breath) 01/12/2018  . CKD (chronic kidney disease), stage III 01/12/2018  . New onset left bundle branch block (LBBB) 01/12/2018  . COPD GOLD I D 01/12/2018  . Neurogenic claudication due to lumbar spinal stenosis 09/13/2017  . Symptomatic anemia   . AVM (arteriovenous malformation) of duodenum, acquired with hemorrhage   . Acute GI bleeding 10/09/2016  . COPD with emphysema ([place]) 05/10/2016  . Recurrent laryngeal neuropathy 02/01/2016  . Nausea with vomiting 12/11/2015  . Left carotid stenosis 11/21/2015  . Asymptomatic  carotid artery stenosis 11/13/2015  . Impaired glucose tolerance 10/27/2015  . Solitary pulmonary nodule 10/27/2015  . Thyroid nodule 10/27/2015  . Syncope 10/04/2015  . Bradycardia with less than 60 beats per minute 10/04/2015  . History of embolic stroke 51/70/0174  . Paresthesia of both feet 07/06/2014  . Aortic arch atherosclerosis ([place]) 06/24/2014  . Atherosclerotic ulcer of aorta ([place]) 06/24/2014  . Stroke ([place]) 06/22/2014  . Bilateral carotid artery disease ([place]) 05/18/2014  . TOBACCO USE, QUIT 04/12/2009  . [place] DISEASE, LUMBAR 12/16/2008  . DIVERTICULOSIS, COLON 09/30/2008  . Personal history of colonic polyps 09/30/2008  . DYSPNEA 07/13/2008  . Weakness 11/09/2007  . Hypothyroidism 10/13/2007  . Mixed hyperlipidemia 03/06/2007  . Essential hypertension 03/06/2007  . Osteoarthritis 03/06/2007    Orientation RESPIRATION BLADDER Height & Weight     Self,Time,Situation,Place  Normal External catheter Weight: 133 lb (60.3 kg) Height:  5\' 6"  (167.6 cm)  BEHAVIORAL SYMPTOMS/MOOD NEUROLOGICAL BOWEL NUTRITION STATUS      Continent Diet (See d/c summary)  AMBULATORY STATUS COMMUNICATION OF NEEDS Skin   Extensive Assist Verbally Normal                       Personal Care Assistance Level of Assistance  Bathing,Dressing,Feeding Bathing Assistance: Limited assistance Feeding assistance: Independent Dressing Assistance: Limited assistance     Functional Limitations Info  Sight,Hearing,Speech Sight Info: Impaired Hearing Info: Impaired Speech Info: Adequate    SPECIAL CARE FACTORS FREQUENCY  PT (By licensed PT),OT (By licensed OT)     PT Frequency: 5x/week OT Frequency: 5x/week  Contractures Contractures Info: Not present    Additional Factors Info  Code Status,Allergies Code Status Info: DNR Allergies Info: Catapres (clonidine Hcl), lisinopril, labetalol           Current Medications (09/01/2020):  This is the current hospital active  medication list Current Facility-Administered Medications  Medication Dose Route Frequency Provider Last Rate Last Admin  . acetaminophen (TYLENOL) tablet 650 mg  650 mg Oral Q4H PRN Shalhoub, Sherryll Burger, MD      . amLODipine (NORVASC) tablet 5 mg  5 mg Oral Daily Skeet Latch, MD   5 mg at 09/01/20 1122  . atorvastatin (LIPITOR) tablet 80 mg  80 mg Oral QPM Shalhoub, Sherryll Burger, MD   80 mg at 08/31/20 1828  . bisacodyl (DULCOLAX) EC tablet 5 mg  5 mg Oral Daily PRN Regalado, Belkys A, MD      . enoxaparin (LOVENOX) injection 30 mg  30 mg Subcutaneous Q24H Einar Grad, RPH      . fluticasone furoate-vilanterol (BREO ELLIPTA) 100-25 MCG/INH 1 puff  1 puff Inhalation Daily Shalhoub, Sherryll Burger, MD   1 puff at 09/01/20 [phone]  . furosemide (LASIX) injection 40 mg  40 mg Intravenous BID Skeet Latch, MD      . hydrALAZINE (APRESOLINE) injection 10 mg  10 mg Intravenous Q6H PRN Shalhoub, Sherryll Burger, MD      . hydrALAZINE (APRESOLINE) tablet 75 mg  75 mg Oral TID Skeet Latch, MD   75 mg at 09/01/20 0911  . ipratropium-albuterol (DUONEB) 0.5-2.5 (3) MG/3ML nebulizer solution 3 mL  3 mL Nebulization Q4H PRN Shalhoub, Sherryll Burger, MD      . isosorbide mononitrate (IMDUR) 24 hr tablet 30 mg  30 mg Oral Daily Skeet Latch, MD   30 mg at 09/01/20 1122  . latanoprost (XALATAN) 0.005 % ophthalmic solution 1 drop  1 drop Both Eyes QHS Shalhoub, Sherryll Burger, MD   1 drop at 08/31/20 2234  . levothyroxine (SYNTHROID) tablet 75 mcg  75 mcg Oral Q0600 Vernelle Emerald, MD   75 mcg at 09/01/20 0517  . ondansetron (ZOFRAN) injection 4 mg  4 mg Intravenous Q6H PRN Shalhoub, Sherryll Burger, MD      . pantoprazole (PROTONIX) EC tablet 40 mg  40 mg Oral Daily Skeet Latch, MD   40 mg at 09/01/20 1122  . polyethylene glycol (MIRALAX / GLYCOLAX) packet 17 g  17 g Oral Daily PRN Vernelle Emerald, MD   17 g at 09/01/20 0913  . potassium chloride SA (KLOR-CON) CR tablet 40 mEq  40 mEq Oral BID Regalado, Belkys A,  MD   40 mEq at 09/01/20 0925  . predniSONE (DELTASONE) tablet 5 mg  5 mg Oral Q breakfast Shalhoub, Sherryll Burger, MD   5 mg at 09/01/20 0912  . senna (SENOKOT) tablet 8.6 mg  1 tablet Oral BID Regalado, Belkys A, MD      . umeclidinium bromide (INCRUSE ELLIPTA) 62.5 MCG/INH 1 puff  1 puff Inhalation Daily Shalhoub, Sherryll Burger, MD   1 puff at 09/01/20 [phone]     Discharge Medications: Please see discharge summary for a list of discharge medications.  Relevant Imaging Results:  Relevant Lab Results:   Additional Information SSN [place], [place]

## 2020-09-01 NOTE — Plan of Care (Signed)
  Problem: Education: Goal: Knowledge of General Education information will improve Description: Including pain rating scale, medication(s)/side effects and non-pharmacologic comfort measures Outcome: Progressing   Problem: Clinical Measurements: Goal: Ability to maintain clinical measurements within normal limits will improve Outcome: Progressing   Problem: Clinical Measurements: Goal: Cardiovascular complication will be avoided Outcome: Progressing   Problem: Nutrition: Goal: Adequate nutrition will be maintained Outcome: Progressing   Problem: Pain Managment: Goal: General experience of comfort will improve Outcome: Progressing   Problem: Skin Integrity: Goal: Risk for impaired skin integrity will decrease Outcome: Progressing   

## 2020-09-01 NOTE — TOC Initial Note (Signed)
Transition of Care [facility]) - Initial/Assessment Note    Patient Details  Name: [name] MRN: [id number] Date of Birth: [birth year]  Transition of Care [facility]) CM/SW Contact:    Bethann Berkshire, [place] Phone Number: 09/01/2020, 3:17 PM  Clinical Narrative:                  CSW met with pt to discuss SNF recommendation. Pt lives at home alone in [place]. She lives in a split level house with walkers on each floor and a chair lift to get up the stairs. Pt also has a handicap accessible bathroom. Pt has 2 daughter and 2 sons in the area. Pt is agreeable to SNF at this time. She has no preference. Pt consents to [facility] updated pt's daughter [name]. CSW completed FL2 and faxed bed requests in the hub.   Expected Discharge Plan: Skilled Nursing Facility Barriers to Discharge: Continued Medical Work up   Patient Goals and CMS Choice Patient states their goals for this hospitalization and ongoing recovery are:: Wants snf rehab CMS Medicare.gov Compare Post Acute Care list provided to:: Patient Choice offered to / list presented to : Patient  Expected Discharge Plan and Services Expected Discharge Plan: [place] arrangements for the past 2 months: Single Family Home                                      Prior Living Arrangements/Services Living arrangements for the past 2 months: Single Family Home Lives with:: Self Patient language and need for interpreter reviewed:: Yes Do you feel safe going back to the place where you live?: Yes      Need for Family Participation in Patient Care: No (Comment) Care giver support system in place?: Yes (comment) Current home services: DME,Homehealth aide Criminal Activity/Legal Involvement Pertinent to Current Situation/Hospitalization: No - Comment as needed  Activities of Daily Living      Permission Sought/Granted   Permission granted to share information with : Yes, Verbal Permission Granted  Share  Information with NAME: Jose Persia (Daughter)   [phone] (Mobile)           Emotional Assessment Appearance:: Appears stated age Attitude/Demeanor/Rapport: Engaged Affect (typically observed): Accepting,Pleasant Orientation: : Oriented to Self,Oriented to Place,Oriented to  Time,Oriented to Situation Alcohol / Substance Use: Never Used Psych Involvement: No (comment)  Admission diagnosis:  Atrial fibrillation ([place]) [I48.91] Elevated troponin [R77.8] Elevated brain natriuretic peptide (BNP) level [R79.89] Acute respiratory failure, unspecified whether with hypoxia or hypercapnia ([place]) [J96.00] Patient Active Problem List   Diagnosis Date Noted  . Atrial fibrillation ([place]) 09/01/2020  . Acute respiratory failure ([place])   . Mild aortic stenosis 08/30/2020  . Elevated troponin 08/30/2020  . Pleural effusion on left 08/30/2020  . Pain in joint of left knee 04/10/2020  . D-dimer, elevated 01/14/2020  . Elevated brain natriuretic peptide (BNP) level 01/14/2020  . Advice given about COVID-19 virus infection 07/21/2019  . Current chronic use of systemic steroids 07/13/2019  . Medication management 04/02/2018  . COPD exacerbation ([place]) 03/31/2018  . Hypokalemia 03/30/2018  . COPD with acute exacerbation ([place]) 03/02/2018  . SOB (shortness of breath) 01/12/2018  . CKD (chronic kidney disease), stage III 01/12/2018  . New onset left bundle branch block (LBBB) 01/12/2018  . COPD GOLD I D 01/12/2018  . Neurogenic claudication due to lumbar spinal stenosis 09/13/2017  .  Symptomatic anemia   . AVM (arteriovenous malformation) of duodenum, acquired with hemorrhage   . Acute GI bleeding 10/09/2016  . COPD with emphysema ([place]) 05/10/2016  . Recurrent laryngeal neuropathy 02/01/2016  . Nausea with vomiting 12/11/2015  . Left carotid stenosis 11/21/2015  . Asymptomatic carotid artery stenosis 11/13/2015  . Impaired glucose tolerance 10/27/2015  . Solitary pulmonary nodule 10/27/2015  .  Thyroid nodule 10/27/2015  . Syncope 10/04/2015  . Bradycardia with less than 60 beats per minute 10/04/2015  . History of embolic stroke 75/88/3254  . Paresthesia of both feet 07/06/2014  . Aortic arch atherosclerosis ([place]) 06/24/2014  . Atherosclerotic ulcer of aorta ([place]) 06/24/2014  . Stroke ([place]) 06/22/2014  . Bilateral carotid artery disease ([place]) 05/18/2014  . TOBACCO USE, QUIT 04/12/2009  . [place] DISEASE, LUMBAR 12/16/2008  . DIVERTICULOSIS, COLON 09/30/2008  . Personal history of colonic polyps 09/30/2008  . DYSPNEA 07/13/2008  . Weakness 11/09/2007  . Hypothyroidism 10/13/2007  . Mixed hyperlipidemia 03/06/2007  . Essential hypertension 03/06/2007  . Osteoarthritis 03/06/2007   PCP:  [name], NP Pharmacy:   [id number] - [place], [place]. AT [address] [place] [address] Phone: [phone] Fax: [phone]     Social Determinants of Health (SDOH) Interventions    Readmission Risk Interventions No flowsheet data found.

## 2020-09-01 NOTE — Progress Notes (Signed)
Physical Therapy Treatment Patient Details Name: [name] MRN: [id number] DOB: Apr 12, 1925 Today's Date: 09/01/2020    History of Present Illness Pt is a 87 y/o female admitted secondary to worsening SOB and fatigue. Found to have new onset a fib and L pleural effusion. PMH includes COPD, HTN, CAD, and lumbar stenosis.    PT Comments    Pt motivated to get OOB, as she reports she has been laying too much. Pt ambulatory for short hallway distance with RW this day, with RRmax 35 breaths/min and SPO2 WFL on RA. Pt with tachycardia to 105 bpm during mobility,  with alarms for "vtach", "afib", "ventricular rhythm". RN notified of this, as well as pt reported shortness of breath. Pt expressing at end of session how weak she feels vs baseline, and will not have 24/7 assist at home as pt's daughter present today works full time and pt's other daughter had recent back surgery. Pt open to ST-SNF to regain strength and baseline mobility prior to d/c home, RN notified. Will continue to follow acutely.    Follow Up Recommendations  SNF     Equipment Recommendations  None recommended by PT    Recommendations for Other Services       Precautions / Restrictions Precautions Precautions: Fall Restrictions Weight Bearing Restrictions: No    Mobility  Bed Mobility Overal bed mobility: Needs Assistance Bed Mobility: Supine to Sit     Supine to sit: Min assist     General bed mobility comments: min assist for trunk elevation with HHA facilitating, increased time    Transfers Overall transfer level: Needs assistance Equipment used: Rolling walker (2 wheeled) Transfers: Sit to/from Stand Sit to Stand: Min guard;Min assist         General transfer comment: min guard for rise from EOB for safety, min assist for power up from low toilet. STS x2 during session.  Ambulation/Gait Ambulation/Gait assistance: Min guard Gait Distance (Feet): 90 Feet (+10) Assistive device: Rolling walker  (2 wheeled) Gait Pattern/deviations: Step-through pattern;Decreased stride length;Trunk flexed Gait velocity: decr   General Gait Details: Min guard for safety, verbal cuing for upright posture, placement in RW. HRmax 105 bpm with alarms for "vtach", "afib", "ventricular rhythm"   Stairs             Wheelchair Mobility    Modified Rankin (Stroke Patients Only)       Balance Overall balance assessment: Needs assistance Sitting-balance support: No upper extremity supported;Feet supported Sitting balance-Leahy Scale: Good     Standing balance support: Bilateral upper extremity supported Standing balance-Leahy Scale: Poor Standing balance comment: Reliant on BUE support                            Cognition Arousal/Alertness: Awake/alert Behavior During Therapy: WFL for tasks assessed/performed Overall Cognitive Status: Within Functional Limits for tasks assessed (most likely at baseline)                                 General Comments: HOH, but sharp and witty      Exercises      General Comments        Pertinent Vitals/Pain Pain Assessment: Faces Faces Pain Scale: Hurts a little bit Pain Location: buttocks Pain Descriptors / Indicators: Sore Pain Intervention(s): Limited activity within patient's tolerance;Monitored during session;Repositioned    Home Living  Prior Function            PT Goals (current goals can now be found in the care plan section) Acute Rehab PT Goals Patient Stated Goal: get stronger, I am weak PT Goal Formulation: With patient Time For Goal Achievement: 09/14/20 Potential to Achieve Goals: Good Progress towards PT goals: Progressing toward goals    Frequency    Min 2X/week      PT Plan Discharge plan needs to be updated    Co-evaluation              AM-PAC PT "6 Clicks" Mobility   Outcome Measure  Help needed turning from your back to your side while in  a flat bed without using bedrails?: A Little Help needed moving from lying on your back to sitting on the side of a flat bed without using bedrails?: A Little Help needed moving to and from a bed to a chair (including a wheelchair)?: A Little Help needed standing up from a chair using your arms (e.g., wheelchair or bedside chair)?: A Little Help needed to walk in hospital room?: A Little Help needed climbing 3-5 steps with a railing? : A Lot 6 Click Score: 17    End of Session   Activity Tolerance: Patient limited by pain;Patient limited by fatigue Patient left: with call bell/phone within reach;in chair;with chair alarm set;with family/visitor present Nurse Communication: Mobility status PT Visit Diagnosis: Unsteadiness on feet (R26.81);Muscle weakness (generalized) (M62.81);Difficulty in walking, not elsewhere classified (R26.2)     Time: 7903-8333 PT Time Calculation (min) (ACUTE ONLY): 31 min  Charges:  $Gait Training: 8-22 mins $Therapeutic Activity: 8-22 mins                     Stacie Glaze, PT Acute Rehabilitation Services Pager [phone]  Office [phone]    [place] 09/01/2020, 11:57 AM

## 2020-09-01 NOTE — Plan of Care (Signed)
  Problem: Education: Goal: Knowledge of General Education information will improve Description: Including pain rating scale, medication(s)/side effects and non-pharmacologic comfort measures Outcome: Progressing   Problem: Health Behavior/Discharge Planning: Goal: Ability to manage health-related needs will improve Outcome: Progressing   Problem: Clinical Measurements: Goal: Ability to maintain clinical measurements within normal limits will improve Outcome: Progressing Goal: Respiratory complications will improve Outcome: Progressing   Problem: Activity: Goal: Risk for activity intolerance will decrease Outcome: Progressing   Problem: Elimination: Goal: Will not experience complications related to urinary retention Outcome: Progressing   Problem: Pain Managment: Goal: General experience of comfort will improve Outcome: Progressing   Problem: Safety: Goal: Ability to remain free from injury will improve Outcome: Progressing

## 2020-09-02 DIAGNOSIS — I5021 Acute systolic (congestive) heart failure: Secondary | ICD-10-CM

## 2020-09-02 LAB — BASIC METABOLIC PANEL
Anion gap: 9 (ref 5–15)
BUN: 28 mg/dL — ABNORMAL HIGH (ref 8–23)
CO2: 24 mmol/L (ref 22–32)
Calcium: 8.8 mg/dL — ABNORMAL LOW (ref 8.9–10.3)
Chloride: 101 mmol/L (ref 98–111)
Creatinine, Ser: 1.43 mg/dL — ABNORMAL HIGH (ref 0.44–1.00)
GFR, Estimated: 34 mL/min — ABNORMAL LOW (ref >60)
Glucose, Bld: 149 mg/dL — ABNORMAL HIGH (ref 70–99)
Potassium: 4.5 mmol/L (ref 3.5–5.1)
Sodium: 134 mmol/L — ABNORMAL LOW (ref 135–145)

## 2020-09-02 LAB — CBC
HCT: 39.7 % (ref 36.0–46.0)
Hemoglobin: 12.8 g/dL (ref 12.0–15.0)
MCH: 30.2 pg (ref 26.0–34.0)
MCHC: 32.2 g/dL (ref 30.0–36.0)
MCV: 93.6 fL (ref 80.0–100.0)
Platelets: 175 10*3/uL (ref 150–400)
RBC: 4.24 MIL/uL (ref 3.87–5.11)
RDW: 13.2 % (ref 11.5–15.5)
WBC: 6.1 10*3/uL (ref 4.0–10.5)
nRBC: 0 % (ref 0.0–0.2)

## 2020-09-02 MED ORDER — POTASSIUM CHLORIDE CRYS ER 20 MEQ PO TBCR: 40.0000 meq | EXTENDED_RELEASE_TABLET | Freq: Every day | ORAL | Status: DC

## 2020-09-02 NOTE — Progress Notes (Signed)
Progress Note  Patient Name: [name] Date of Encounter: 09/02/2020  [facility] Cardiologist: Jenkins Rouge, MD   Subjective   No CP; dyspnea improving  Inpatient Medications    Scheduled Meds: . amLODipine  5 mg Oral Daily  . atorvastatin  80 mg Oral QPM  . enoxaparin (LOVENOX) injection  30 mg Subcutaneous Q24H  . fluticasone furoate-vilanterol  1 puff Inhalation Daily  . furosemide  40 mg Intravenous BID  . hydrALAZINE  75 mg Oral TID  . isosorbide mononitrate  30 mg Oral Daily  . latanoprost  1 drop Both Eyes QHS  . levothyroxine  75 mcg Oral Q0600  . pantoprazole  40 mg Oral Daily  . potassium chloride  40 mEq Oral BID  . predniSONE  5 mg Oral Q breakfast  . senna  1 tablet Oral BID  . umeclidinium bromide  1 puff Inhalation Daily   Continuous Infusions:  PRN Meds: acetaminophen, bisacodyl, hydrALAZINE, ipratropium-albuterol, ondansetron (ZOFRAN) IV, polyethylene glycol   Vital Signs    Vitals:   09/01/20 2105 09/01/20 2300 09/02/20 0300 09/02/20 0735  BP: 124/60 (!) 106/55 (!) 126/53   Pulse:  74 76 95  Resp:  (!) 23 20 (!) 21  Temp:  98.1 F (36.7 C) 97.8 F (36.6 C)   TempSrc:  Oral Oral   SpO2:  95% 93% 93%  Weight:      Height:        Intake/Output Summary (Last 24 hours) at 09/02/2020 0752 Last data filed at 09/01/2020 2300 Gross per 24 hour  Intake 600 ml  Output 900 ml  Net -300 ml   Last 3 Weights 08/30/2020 08/22/2020 08/08/2020  Weight (lbs) 133 lb 133 lb 128 lb  Weight (kg) 60.328 kg 60.328 kg 58.06 kg      Telemetry    Sinus- Personally Reviewed  Physical Exam   GEN: No acute distress.   Neck: No JVD Cardiac: RRR, 2/6 systolic murmur Respiratory: Clear to auscultation bilaterally. GI: Soft, nontender, non-distended  MS: No edema Neuro:  Nonfocal  Psych: Normal affect   Labs    High Sensitivity Troponin:   Recent Labs  Lab 08/30/20 1657 08/30/20 1857  TROPONINIHS 82* 73*      Chemistry Recent Labs  Lab  08/30/20 1657 08/30/20 1758 08/31/20 0320  NA 132* 133* 136  K 3.8 4.0 3.2*  CL 99  --  101  CO2 24  --  25  GLUCOSE 147*  --  102*  BUN 18  --  16  CREATININE 1.06*  --  1.17*  CALCIUM 8.7*  --  8.5*  PROT  --   --  4.9*  ALBUMIN  --   --  3.0*  AST  --   --  18  ALT  --   --  16  ALKPHOS  --   --  69  BILITOT  --   --  0.9  GFRNONAA 49*  --  44*  ANIONGAP 9  --  10     Hematology Recent Labs  Lab 08/30/20 1657 08/30/20 1758  WBC 5.6  --   RBC 4.12  --   HGB 12.9 12.6  HCT 38.2 37.0  MCV 92.7  --   MCH 31.3  --   MCHC 33.8  --   RDW 13.2  --   PLT 176  --     BNP Recent Labs  Lab 08/30/20 1657  BNP 348.6*     Radiology  ECHOCARDIOGRAM COMPLETE  Result Date: 08/31/2020    ECHOCARDIOGRAM REPORT   Patient Name:   [name] Date of Exam: 08/31/2020 Medical Rec #:  [id number]     Height:       66.0 in Accession #:    [id number]    Weight:       133.0 lb Date of Birth:  01-28-1925    BSA:          1.681 m Patient Age:    87 years      BP:           150/58 mmHg Patient Gender: F             HR:           77 bpm. Exam Location:  Inpatient Procedure: 2D Echo, Cardiac Doppler and Color Doppler Indications:    I48.0 Paroxysmal atrial fibrillation  History:        Patient has prior history of Echocardiogram examinations, most                 recent 01/13/2018. Stroke, Carotid Disease and COPD,                 Signs/Symptoms:Dyspnea; Risk Factors:Hypertension, Dyslipidemia                 and Former Smoker. Graves' Disease. CKD.  Sonographer:    Jonelle Sidle Dance Referring Phys: [id number] [facility]  1. Left ventricular ejection fraction, by estimation, is 40 to 45%. The left ventricle has mildly decreased function. The left ventricle has no regional wall motion abnormalities. Left ventricular diastolic parameters are consistent with Grade I diastolic dysfunction (impaired relaxation).  2. Right ventricular systolic function is normal. The right ventricular size  is normal.  3. Left atrial size was mildly dilated.  4. Moderate pleural effusion.  5. The mitral valve is normal in structure. Mild mitral valve regurgitation. No evidence of mitral stenosis.  6. Fixed right coronary cusp. The aortic valve is tricuspid. There is moderate calcification of the aortic valve. There is moderate thickening of the aortic valve. Aortic valve regurgitation is trivial. Moderate aortic valve stenosis. Aortic valve mean gradient measures 19.5 mmHg. Aortic valve Vmax measures 3.04 m/s.  7. The inferior vena cava is normal in size with greater than 50% respiratory variability, suggesting right atrial pressure of 3 mmHg. Conclusion(s)/Recommendation(s): No intracardiac source of embolism detected on this transthoracic study. A transesophageal echocardiogram is recommended to exclude cardiac source of embolism if clinically indicated. FINDINGS  Left Ventricle: Left ventricular ejection fraction, by estimation, is 40 to 45%. The left ventricle has mildly decreased function. The left ventricle has no regional wall motion abnormalities. The left ventricular internal cavity size was normal in size. There is no left ventricular hypertrophy. Left ventricular diastolic parameters are consistent with Grade I diastolic dysfunction (impaired relaxation).  LV Wall Scoring: The inferior wall is akinetic. Right Ventricle: The right ventricular size is normal. No increase in right ventricular wall thickness. Right ventricular systolic function is normal. Left Atrium: Left atrial size was mildly dilated. Right Atrium: Right atrial size was normal in size. Pericardium: There is no evidence of pericardial effusion. Mitral Valve: The mitral valve is normal in structure. Mild mitral valve regurgitation. No evidence of mitral valve stenosis. Tricuspid Valve: The tricuspid valve is normal in structure. Tricuspid valve regurgitation is not demonstrated. No evidence of tricuspid stenosis. Aortic Valve: Fixed right  coronary cusp. The aortic valve is tricuspid. There is  moderate calcification of the aortic valve. There is moderate thickening of the aortic valve. Aortic valve regurgitation is trivial. Aortic regurgitation PHT measures 321 msec. Moderate aortic stenosis is present. Aortic valve mean gradient measures 19.5 mmHg. Aortic valve peak gradient measures 36.8 mmHg. Aortic valve area, by VTI measures 0.78 cm. Pulmonic Valve: The pulmonic valve was normal in structure. Pulmonic valve regurgitation is not visualized. No evidence of pulmonic stenosis. Aorta: The aortic root is normal in size and structure. Venous: The inferior vena cava is normal in size with greater than 50% respiratory variability, suggesting right atrial pressure of 3 mmHg. IAS/Shunts: No atrial level shunt detected by color flow Doppler. Additional Comments: There is a moderate pleural effusion.  LEFT VENTRICLE PLAX 2D LVIDd:         4.00 cm LVIDs:         3.50 cm LV PW:         1.00 cm LV IVS:        0.90 cm LVOT diam:     1.90 cm LV SV:         50 LV SV Index:   30 LVOT Area:     2.84 cm  RIGHT VENTRICLE             IVC RV Basal diam:  2.70 cm     IVC diam: 1.90 cm RV S prime:     12.90 cm/s TAPSE (M-mode): 1.9 cm LEFT ATRIUM             Index       RIGHT ATRIUM           Index LA diam:        3.50 cm 2.08 cm/m  RA Area:     12.10 cm LA Vol (A2C):   61.2 ml 36.40 ml/m RA Volume:   26.60 ml  15.82 ml/m LA Vol (A4C):   57.6 ml 34.26 ml/m LA Biplane Vol: 57.7 ml 34.32 ml/m  AORTIC VALVE AV Area (Vmax):    0.87 cm AV Area (Vmean):   0.84 cm AV Area (VTI):     0.78 cm AV Vmax:           303.50 cm/s AV Vmean:          203.750 cm/s AV VTI:            0.641 m AV Peak Grad:      36.8 mmHg AV Mean Grad:      19.5 mmHg LVOT Vmax:         93.30 cm/s LVOT Vmean:        60.600 cm/s LVOT VTI:          0.176 m LVOT/AV VTI ratio: 0.27 AI PHT:            321 msec  AORTA Ao Root diam: 2.90 cm Ao Asc diam:  3.00 cm MITRAL VALVE MV Area (PHT): 2.48 cm      SHUNTS MV Decel Time: 306 msec     Systemic VTI:  0.18 m MV E velocity: 59.00 cm/s   Systemic Diam: 1.90 cm MV A velocity: 118.00 cm/s MV E/A ratio:  0.50 Candee Furbish MD Electronically signed by Candee Furbish MD Signature Date/Time: 08/31/2020/4:26:40 PM    Final     Patient Profile     87 y.o. female with past medical history of hypertension, carotid artery disease, aortic stenosis, hypertension, hyperlipidemia, COPD, chronic kidney disease, prior GI bleed admitted with acute on chronic combined systolic/diastolic  congestive heart failure.  Assessment & Plan    1 acute on chronic combined systolic/diastolic congestive heart failure-[id number].  Symptoms are improving.  We will continue Lasix at present dose today.  Follow renal function.  Possible discharge next 24 to 48 hours.  2 cardiomyopathy-ejection fraction 40 to 45%.  However given patient's age and comorbidities plan is conservative management.  She was not on a beta-blocker previously as she has had bradycardia with syncope.  She had anaphylaxis with ACE inhibitor in the past.  Continue hydralazine/nitrates.  3 coronary artery disease-patient noted to have calcification in her coronaries and aorta.  Plan is medical therapy.  Continue aspirin and statin.  4 moderate aortic stenosis-she will need follow-up as an outpatient.  5 elevated troponin-felt likely to be demand ischemia.  No further ischemia evaluation anticipated.  For questions or updates, please contact [place] Please consult [email or web address] for contact info under        Signed, Kirk Ruths, MD  09/02/2020, 7:52 AM

## 2020-09-02 NOTE — Plan of Care (Signed)

## 2020-09-02 NOTE — Plan of Care (Signed)
  Problem: Education: Goal: Knowledge of General Education information will improve Description: Including pain rating scale, medication(s)/side effects and non-pharmacologic comfort measures Outcome: Progressing   Problem: Health Behavior/Discharge Planning: Goal: Ability to manage health-related needs will improve Outcome: Progressing   Problem: Clinical Measurements: Goal: Ability to maintain clinical measurements within normal limits will improve Outcome: Progressing   Problem: Clinical Measurements: Goal: Diagnostic test results will improve Outcome: Progressing   Problem: Clinical Measurements: Goal: Respiratory complications will improve Outcome: Progressing   Problem: Clinical Measurements: Goal: Cardiovascular complication will be avoided Outcome: Progressing   Problem: Activity: Goal: Risk for activity intolerance will decrease Outcome: Progressing   Problem: Nutrition: Goal: Adequate nutrition will be maintained Outcome: Progressing   Problem: Safety: Goal: Ability to remain free from injury will improve Outcome: Progressing   Problem: Skin Integrity: Goal: Risk for impaired skin integrity will decrease Outcome: Progressing   Problem: Pain Managment: Goal: General experience of comfort will improve Outcome: Progressing

## 2020-09-02 NOTE — Progress Notes (Signed)
PROGRESS NOTE    [name]  WJX:[id number] DOB: 12-22-1924 DOA: 08/30/2020 PCP: [name], NP   Brief Narrative: 87 year old with past medical history significant for COPD, hypertension, CAD, lumbar spinal stenosis, mild aortic stenosis, hypothyroidism who presents to [facility] complaining of shortness of breath.  She reports shortness of breath on exertion, unable to identify for how long she has been experiencing these symptoms.  She does report 3 to 4 days history of generalized malaise and fatigue.  Developed worsening shortness of breath today while watching TV.  EMS was contacted and patient was found to be in A. Fib.  Evaluation in the ED: Chest x-ray revealed left-sided pleural effusion with associated atelectasis of the left lower lobe.  CT angio was negative for PE with evidence of emphysema and left-sided pleural effusion.  Troponin mildly at elevated at 82.   Assessment & Plan:   Principal Problem:   Acute systolic CHF (congestive heart failure) (HCC) Active Problems:   Hypothyroidism   Mixed hyperlipidemia   Essential hypertension   COPD with emphysema (HCC)   Neurogenic claudication due to lumbar spinal stenosis   Mild aortic stenosis   Elevated troponin   Pleural effusion on left  1-Sinus Arrhythmia with PAC:  -Cardiology consulted, per their review no A fib.  -ECHO; E 45 %, grade one diastolic dysfunction. Moderate pleural effusion. Moderate Aortic Valve stenosis,  -CT angio negative for PE. -Mild elevation of troponin.   2-Acute combine Systolic and  diastolic heart failure exacerbation: Presented with SOB, BNP 348, mild elevation troponin. Chest X ray: Small left pleural effusion with basilar atelectasis or pneumonia. Mild cardiomegaly. Patient on IV lasix to 40 mg IV BID.  Cardiology consulted.  Negative 3.4  L. Urine out put 900 New decrease EF 45 %, plan to treat medically. No plan for cath.  Started on Imdur. Continue with hydralazine.   Cardiology recommend one more day of IV lasix, might be able to transition to oral lasix tomorrow.  Monitor renal function. Cr increase to 1.4.   3-Hypothyroidism: Continue with Synthroid.   4-Hyperlipidemia: Continue with Lipitor.   5-Hypertension: Continue with hydralazine,  norvasc.   6-COPD: No significant evidence of exacerbation.  Continue with low dose prednisone, Incruse, Duoneb PRN.   Neurogenic claudication due to lumbar spinal stenosis: PT, consult.  Tylenol PRN pain.   Mild elevation of troponin: Suspect related to HF.  Pleural effusion on the left; Continue with IV lasix.   Mild aortic stenosis: ECHO moderate aortic stenosis by ECHO.  Diuresis.   Hypokalemia: On Kcl daily/    Urinary retention: Had In and out cath last night 800 and 1 L out put.  Monitor urine out put.   Constipation; start senna, received mira lax.     Estimated body mass index is 21.47 kg/m as calculated from the following:   Height as of this encounter: 5\' 6"  (1.676 m).   Weight as of this encounter: 60.3 kg.   DVT prophylaxis: Lovenox Code Status: DNR Family Communication: Daughter updated 2/25,,, 2/26 Disposition Plan:  Status OZ:[id number].   The patient will require care spanning > 2 midnights and should be moved to inpatient because: IV treatments appropriate due to intensity of illness or inability to take PO  Dispo: The patient is from: Home              Anticipated d/c is to: SNF              Anticipated d/c date is: 2  days              Patient currently is not medically stable to d/c.   Difficult to place patient No        Consultants:   Cardiology   Procedures:   ECHO  Antimicrobials:    Subjective: She is breathing better, denies chest pain.    Objective: Vitals:   09/02/20 0300 09/02/20 0735 09/02/20 0800 09/02/20 1129  BP: (!) 126/53  (!) 151/63 131/75  Pulse: 76 95 81 88  Resp: 20 (!) 21 18 16   Temp: 97.8 F (36.6 C)  97.8 F (36.6  C) (!) 97.2 F (36.2 C)  TempSrc: Oral  Oral Oral  SpO2: 93% 93% 92% 95%  Weight:      Height:        Intake/Output Summary (Last 24 hours) at 09/02/2020 1225 Last data filed at 09/02/2020 1100 Gross per 24 hour  Intake 840 ml  Output 501 ml  Net 339 ml   Filed Weights   08/30/20 1655  Weight: 60.3 kg    Examination:  General exam: NAD Respiratory system: B/L crackles.  Cardiovascular system: S 1, S 2 RRR Gastrointestinal system: BS present, soft.  Central nervous system: Alert Extremities: Trace edema    Data Reviewed: I have personally reviewed following labs and imaging studies  CBC: Recent Labs  Lab 08/30/20 1657 08/30/20 1758 09/02/20 0809  WBC 5.6  --  6.1  NEUTROABS 4.3  --   --   HGB 12.9 12.6 12.8  HCT 38.2 37.0 39.7  MCV 92.7  --  93.6  PLT 176  --  606   Basic Metabolic Panel: Recent Labs  Lab 08/30/20 1657 08/30/20 1758 08/31/20 0320 09/02/20 0809  NA 132* 133* 136 134*  K 3.8 4.0 3.2* 4.5  CL 99  --  101 101  CO2 24  --  25 24  GLUCOSE 147*  --  102* 149*  BUN 18  --  16 28*  CREATININE 1.06*  --  1.17* 1.43*  CALCIUM 8.7*  --  8.5* 8.8*  MG 2.1  --  1.9  --    GFR: Estimated Creatinine Clearance: 23 mL/min (A) (by C-G formula based on SCr of 1.43 mg/dL (H)). Liver Function Tests: Recent Labs  Lab 08/31/20 0320  AST 18  ALT 16  ALKPHOS 69  BILITOT 0.9  PROT 4.9*  ALBUMIN 3.0*   No results for input(s): LIPASE, AMYLASE in the last 168 hours. No results for input(s): AMMONIA in the last 168 hours. Coagulation Profile: Recent Labs  Lab 08/31/20 0320  INR 1.1   Cardiac Enzymes: No results for input(s): CKTOTAL, CKMB, CKMBINDEX, TROPONINI in the last 168 hours. BNP (last 3 results) No results for input(s): PROBNP in the last 8760 hours. HbA1C: No results for input(s): HGBA1C in the last 72 hours. CBG: No results for input(s): GLUCAP in the last 168 hours. Lipid Profile: No results for input(s): CHOL, HDL, LDLCALC,  TRIG, CHOLHDL, LDLDIRECT in the last 72 hours. Thyroid Function Tests: Recent Labs    08/31/20 0320  TSH 3.587   Anemia Panel: No results for input(s): VITAMINB12, FOLATE, FERRITIN, TIBC, IRON, RETICCTPCT in the last 72 hours. Sepsis Labs: Recent Labs  Lab 08/30/20 2040 08/31/20 0320 09/01/20 0012  PROCALCITON <0.10 <0.10 <0.10    Recent Results (from the past 240 hour(s))  Blood culture (routine x 2)     Status: None (Preliminary result)   Collection Time: 08/30/20  8:40 PM  Specimen: BLOOD RIGHT ARM  Result Value Ref Range Status   Specimen Description BLOOD RIGHT ARM  Final   Special Requests   Final    BOTTLES DRAWN AEROBIC AND ANAEROBIC Blood Culture adequate volume   Culture   Final    NO GROWTH 2 DAYS Performed at [facility], [address], [place], [place] [address]    Report Status PENDING  Incomplete  Blood culture (routine x 2)     Status: None (Preliminary result)   Collection Time: 08/30/20  8:45 PM   Specimen: BLOOD RIGHT HAND  Result Value Ref Range Status   Specimen Description BLOOD RIGHT HAND  Final   Special Requests   Final    BOTTLES DRAWN AEROBIC AND ANAEROBIC Blood Culture adequate volume   Culture   Final    NO GROWTH 2 DAYS Performed at [facility], [place] [address], [place], [place] [address]    Report Status PENDING  Incomplete  SARS CORONAVIRUS 2 (TAT 6-24 HRS) Nasopharyngeal Nasopharyngeal Swab     Status: None   Collection Time: 08/30/20 10:15 PM   Specimen: Nasopharyngeal Swab  Result Value Ref Range Status   SARS Coronavirus 2 NEGATIVE NEGATIVE Final    Comment: (NOTE) SARS-CoV-2 target nucleic acids are NOT DETECTED.  The SARS-CoV-2 RNA is generally detectable in upper and lower respiratory specimens during the acute phase of infection. Negative results do not preclude SARS-CoV-2 infection, do not rule out co-infections with other pathogens, and should not be used as the sole basis for treatment or other patient  management decisions. Negative results must be combined with clinical observations, patient history, and epidemiological information. The expected result is Negative.  Fact Sheet for Patients: SugarRoll.be  Fact Sheet for Healthcare Providers: https://www.woods-mathews.com/  This test is not yet approved or cleared by the [place] FDA and  has been authorized for detection and/or diagnosis of SARS-CoV-2 by FDA under an Emergency Use Authorization (EUA). This EUA will remain  in effect (meaning this test can be used) for the duration of the COVID-19 declaration under Se ction 564(b)(1) of the Act, 21 U.S.C. section 360bbb-3(b)(1), unless the authorization is terminated or revoked sooner.  Performed at [facility], [place] [address], [place], [place] [address]   MRSA PCR Screening     Status: None   Collection Time: 09/01/20  9:19 AM   Specimen: Nasopharyngeal  Result Value Ref Range Status   MRSA by PCR NEGATIVE NEGATIVE Final    Comment:        The GeneXpert MRSA Assay (FDA approved for NASAL specimens only), is one component of a comprehensive MRSA colonization surveillance program. It is not intended to diagnose MRSA infection nor to guide or monitor treatment for MRSA infections. Performed at [facility], [place] [address], [place], [place] [address]          Radiology Studies: ECHOCARDIOGRAM COMPLETE  Result Date: 08/31/2020    ECHOCARDIOGRAM REPORT   Patient Name:   DELPHA PERKO Date of Exam: 08/31/2020 Medical Rec #:  [id number]     Height:       66.0 in Accession #:    [id number]    Weight:       133.0 lb Date of Birth:  1925/05/06    BSA:          1.681 m Patient Age:    75 years      BP:  150/58 mmHg Patient Gender: F             HR:           77 bpm. Exam Location:  Inpatient Procedure: 2D Echo, Cardiac Doppler and Color Doppler Indications:    I48.0 Paroxysmal atrial fibrillation  History:         Patient has prior history of Echocardiogram examinations, most                 recent 01/13/2018. Stroke, Carotid Disease and COPD,                 Signs/Symptoms:Dyspnea; Risk Factors:Hypertension, Dyslipidemia                 and Former Smoker. Graves' Disease. CKD.  Sonographer:    Jonelle Sidle Dance Referring Phys: [id number] [place]  1. Left ventricular ejection fraction, by estimation, is 40 to 45%. The left ventricle has mildly decreased function. The left ventricle has no regional wall motion abnormalities. Left ventricular diastolic parameters are consistent with Grade I diastolic dysfunction (impaired relaxation).  2. Right ventricular systolic function is normal. The right ventricular size is normal.  3. Left atrial size was mildly dilated.  4. Moderate pleural effusion.  5. The mitral valve is normal in structure. Mild mitral valve regurgitation. No evidence of mitral stenosis.  6. Fixed right coronary cusp. The aortic valve is tricuspid. There is moderate calcification of the aortic valve. There is moderate thickening of the aortic valve. Aortic valve regurgitation is trivial. Moderate aortic valve stenosis. Aortic valve mean gradient measures 19.5 mmHg. Aortic valve Vmax measures 3.04 m/s.  7. The inferior vena cava is normal in size with greater than 50% respiratory variability, suggesting right atrial pressure of 3 mmHg. Conclusion(s)/Recommendation(s): No intracardiac source of embolism detected on this transthoracic study. A transesophageal echocardiogram is recommended to exclude cardiac source of embolism if clinically indicated. FINDINGS  Left Ventricle: Left ventricular ejection fraction, by estimation, is 40 to 45%. The left ventricle has mildly decreased function. The left ventricle has no regional wall motion abnormalities. The left ventricular internal cavity size was normal in size. There is no left ventricular hypertrophy. Left ventricular diastolic parameters are consistent  with Grade I diastolic dysfunction (impaired relaxation).  LV Wall Scoring: The inferior wall is akinetic. Right Ventricle: The right ventricular size is normal. No increase in right ventricular wall thickness. Right ventricular systolic function is normal. Left Atrium: Left atrial size was mildly dilated. Right Atrium: Right atrial size was normal in size. Pericardium: There is no evidence of pericardial effusion. Mitral Valve: The mitral valve is normal in structure. Mild mitral valve regurgitation. No evidence of mitral valve stenosis. Tricuspid Valve: The tricuspid valve is normal in structure. Tricuspid valve regurgitation is not demonstrated. No evidence of tricuspid stenosis. Aortic Valve: Fixed right coronary cusp. The aortic valve is tricuspid. There is moderate calcification of the aortic valve. There is moderate thickening of the aortic valve. Aortic valve regurgitation is trivial. Aortic regurgitation PHT measures 321 msec. Moderate aortic stenosis is present. Aortic valve mean gradient measures 19.5 mmHg. Aortic valve peak gradient measures 36.8 mmHg. Aortic valve area, by VTI measures 0.78 cm. Pulmonic Valve: The pulmonic valve was normal in structure. Pulmonic valve regurgitation is not visualized. No evidence of pulmonic stenosis. Aorta: The aortic root is normal in size and structure. Venous: The inferior vena cava is normal in size with greater than 50% respiratory variability, suggesting right atrial pressure of 3  mmHg. IAS/Shunts: No atrial level shunt detected by color flow Doppler. Additional Comments: There is a moderate pleural effusion.  LEFT VENTRICLE PLAX 2D LVIDd:         4.00 cm LVIDs:         3.50 cm LV PW:         1.00 cm LV IVS:        0.90 cm LVOT diam:     1.90 cm LV SV:         50 LV SV Index:   30 LVOT Area:     2.84 cm  RIGHT VENTRICLE             IVC RV Basal diam:  2.70 cm     IVC diam: 1.90 cm RV S prime:     12.90 cm/s TAPSE (M-mode): 1.9 cm LEFT ATRIUM             Index        RIGHT ATRIUM           Index LA diam:        3.50 cm 2.08 cm/m  RA Area:     12.10 cm LA Vol (A2C):   61.2 ml 36.40 ml/m RA Volume:   26.60 ml  15.82 ml/m LA Vol (A4C):   57.6 ml 34.26 ml/m LA Biplane Vol: 57.7 ml 34.32 ml/m  AORTIC VALVE AV Area (Vmax):    0.87 cm AV Area (Vmean):   0.84 cm AV Area (VTI):     0.78 cm AV Vmax:           303.50 cm/s AV Vmean:          203.750 cm/s AV VTI:            0.641 m AV Peak Grad:      36.8 mmHg AV Mean Grad:      19.5 mmHg LVOT Vmax:         93.30 cm/s LVOT Vmean:        60.600 cm/s LVOT VTI:          0.176 m LVOT/AV VTI ratio: 0.27 AI PHT:            321 msec  AORTA Ao Root diam: 2.90 cm Ao Asc diam:  3.00 cm MITRAL VALVE MV Area (PHT): 2.48 cm     SHUNTS MV Decel Time: 306 msec     Systemic VTI:  0.18 m MV E velocity: 59.00 cm/s   Systemic Diam: 1.90 cm MV A velocity: 118.00 cm/s MV E/A ratio:  0.50 Candee Furbish MD Electronically signed by Candee Furbish MD Signature Date/Time: 08/31/2020/4:26:40 PM    Final         Scheduled Meds: . amLODipine  5 mg Oral Daily  . atorvastatin  80 mg Oral QPM  . enoxaparin (LOVENOX) injection  30 mg Subcutaneous Q24H  . fluticasone furoate-vilanterol  1 puff Inhalation Daily  . furosemide  40 mg Intravenous BID  . hydrALAZINE  75 mg Oral TID  . isosorbide mononitrate  30 mg Oral Daily  . latanoprost  1 drop Both Eyes QHS  . levothyroxine  75 mcg Oral Q0600  . pantoprazole  40 mg Oral Daily  . potassium chloride  40 mEq Oral BID  . predniSONE  5 mg Oral Q breakfast  . senna  1 tablet Oral BID  . umeclidinium bromide  1 puff Inhalation Daily   Continuous Infusions:   LOS: 1 day    Time spent:  35 minutes.     Elmarie Shiley, MD [facility]   If 7PM-7AM, please contact night-coverage [email or web address]  09/02/2020, 12:25 PM

## 2020-09-03 DIAGNOSIS — I5021 Acute systolic (congestive) heart failure: Secondary | ICD-10-CM | POA: Diagnosis not present

## 2020-09-03 LAB — BASIC METABOLIC PANEL
Anion gap: 7 (ref 5–15)
BUN: 33 mg/dL — ABNORMAL HIGH (ref 8–23)
CO2: 28 mmol/L (ref 22–32)
Calcium: 8.4 mg/dL — ABNORMAL LOW (ref 8.9–10.3)
Chloride: 99 mmol/L (ref 98–111)
Creatinine, Ser: 1.64 mg/dL — ABNORMAL HIGH (ref 0.44–1.00)
GFR, Estimated: 29 mL/min — ABNORMAL LOW (ref >60)
Glucose, Bld: 102 mg/dL — ABNORMAL HIGH (ref 70–99)
Potassium: 4.5 mmol/L (ref 3.5–5.1)
Sodium: 134 mmol/L — ABNORMAL LOW (ref 135–145)

## 2020-09-03 LAB — CBC
HCT: 35 % — ABNORMAL LOW (ref 36.0–46.0)
Hemoglobin: 11.8 g/dL — ABNORMAL LOW (ref 12.0–15.0)
MCH: 31.2 pg (ref 26.0–34.0)
MCHC: 33.7 g/dL (ref 30.0–36.0)
MCV: 92.6 fL (ref 80.0–100.0)
Platelets: 171 10*3/uL (ref 150–400)
RBC: 3.78 MIL/uL — ABNORMAL LOW (ref 3.87–5.11)
RDW: 13.5 % (ref 11.5–15.5)
WBC: 5.6 10*3/uL (ref 4.0–10.5)
nRBC: 0 % (ref 0.0–0.2)

## 2020-09-03 MED ORDER — ASPIRIN 81 MG PO CHEW
81.0000 mg | CHEWABLE_TABLET | Freq: Every day | ORAL | Status: DC
  Administered 2020-09-03 – 2020-09-04 (×2): 81 mg via ORAL
  Filled 2020-09-03 (×2): qty 1

## 2020-09-03 MED ORDER — FUROSEMIDE 40 MG PO TABS
40.0000 mg | ORAL_TABLET | Freq: Every day | ORAL | Status: DC
  Administered 2020-09-03: 40 mg via ORAL
  Filled 2020-09-03: qty 1

## 2020-09-03 MED ORDER — ALUM & MAG HYDROXIDE-SIMETH 200-200-20 MG/5ML PO SUSP
30.0000 mL | ORAL | Status: DC | PRN
  Administered 2020-09-03: 30 mL via ORAL
  Filled 2020-09-03: qty 30

## 2020-09-03 NOTE — Progress Notes (Signed)
Ambulated along the hallway with front wheel walker on room air. Complained of slight shortness of breath at the end of the ambulation. Pulse ox on room air is 93%. Complained of constipation for 4 days with no BM. miralax given. Continue to monitor.

## 2020-09-03 NOTE — Progress Notes (Signed)
Progress Note  Patient Name: [name] Date of Encounter: 09/03/2020  [facility] Cardiologist: Jenkins Rouge, MD   Subjective   Denies CP or dyspnea  Inpatient Medications    Scheduled Meds: . amLODipine  5 mg Oral Daily  . atorvastatin  80 mg Oral QPM  . enoxaparin (LOVENOX) injection  30 mg Subcutaneous Q24H  . fluticasone furoate-vilanterol  1 puff Inhalation Daily  . hydrALAZINE  75 mg Oral TID  . isosorbide mononitrate  30 mg Oral Daily  . latanoprost  1 drop Both Eyes QHS  . levothyroxine  75 mcg Oral Q0600  . pantoprazole  40 mg Oral Daily  . predniSONE  5 mg Oral Q breakfast  . senna  1 tablet Oral BID  . umeclidinium bromide  1 puff Inhalation Daily   Continuous Infusions:  PRN Meds: acetaminophen, bisacodyl, hydrALAZINE, ipratropium-albuterol, ondansetron (ZOFRAN) IV, polyethylene glycol   Vital Signs    Vitals:   09/02/20 2111 09/02/20 2300 09/03/20 0300 09/03/20 0500  BP: 121/68 105/61 (!) 107/55   Pulse:  63 64   Resp:  17 19   Temp:  99 F (37.2 C) 98.6 F (37 C)   TempSrc:  Oral Oral   SpO2:  94% 95%   Weight:    57.4 kg  Height:        Intake/Output Summary (Last 24 hours) at 09/03/2020 0720 Last data filed at 09/02/2020 1800 Gross per 24 hour  Intake 940 ml  Output 801 ml  Net 139 ml   Last 3 Weights 09/03/2020 08/30/2020 08/22/2020  Weight (lbs) 126 lb 8.7 oz 133 lb 133 lb  Weight (kg) 57.4 kg 60.328 kg 60.328 kg      Telemetry    Sinus with pacs and pvcs- Personally Reviewed  Physical Exam   GEN: WD NAD Neck: supple Cardiac: RRR, 2/6 systolic murmur, no gallop Respiratory: CTA GI: Soft, NT/ND MS: No edema Neuro:  Grossly intact Psych: Normal affect   Labs    High Sensitivity Troponin:   Recent Labs  Lab 08/30/20 1657 08/30/20 1857  TROPONINIHS 82* 73*      Chemistry Recent Labs  Lab 08/31/20 0320 09/02/20 0809 09/03/20 0059  NA 136 134* 134*  K 3.2* 4.5 4.5  CL 101 101 99  CO2 25 24 28   GLUCOSE 102*  149* 102*  BUN 16 28* 33*  CREATININE 1.17* 1.43* 1.64*  CALCIUM 8.5* 8.8* 8.4*  PROT 4.9*  --   --   ALBUMIN 3.0*  --   --   AST 18  --   --   ALT 16  --   --   ALKPHOS 69  --   --   BILITOT 0.9  --   --   GFRNONAA 44* 34* 29*  ANIONGAP 10 9 7      Hematology Recent Labs  Lab 08/30/20 1657 08/30/20 1758 09/02/20 0809 09/03/20 0059  WBC 5.6  --  6.1 5.6  RBC 4.12  --  4.24 3.78*  HGB 12.9 12.6 12.8 11.8*  HCT 38.2 37.0 39.7 35.0*  MCV 92.7  --  93.6 92.6  MCH 31.3  --  30.2 31.2  MCHC 33.8  --  32.2 33.7  RDW 13.2  --  13.2 13.5  PLT 176  --  175 171    BNP Recent Labs  Lab 08/30/20 1657  BNP 348.6*     Patient Profile     87 y.o. female with past medical history of hypertension, carotid  artery disease, aortic stenosis, hypertension, hyperlipidemia, COPD, chronic kidney disease, prior GI bleed admitted with acute on chronic combined systolic/diastolic congestive heart failure.  Echocardiogram shows ejection fraction 40 to 24%, grade 1 diastolic dysfunction, mild left atrial enlargement, mild mitral regurgitation, moderate aortic stenosis with mean gradient 20 mmHg, trace aortic insufficiency.  Assessment & Plan    1 acute on chronic combined systolic/diastolic congestive heart failure-[id number]/[id number].  Wt 57.4 kg. Symptoms improved.  Change Lasix to 40 mg by mouth daily.  Will need close follow-up of renal function after discharge (bmet 1 week).  2 cardiomyopathy-ejection fraction 40 to 45%.  However given patient's age and comorbidities plan is conservative management.  She was not on a beta-blocker previously as she has had bradycardia with syncope.  She had anaphylaxis with ACE inhibitor in the past.  Continue hydralazine/nitrates.  3 coronary artery disease-patient noted to have calcification in her coronaries and aorta.  Plan is medical therapy.  Continue aspirin and statin.  4 moderate aortic stenosis-she will need follow-up as an outpatient.  5 elevated  troponin-felt likely to be demand ischemia.  No further ischemia evaluation anticipated.  6 hypertension-blood pressure has trended down.  We will discontinue amlodipine and continue hydralazine/nitrates.  Follow blood pressure and adjust medications as needed.  For questions or updates, please contact [place] Please consult [email or web address] for contact info under        Signed, Kirk Ruths, MD  09/03/2020, 7:20 AM

## 2020-09-03 NOTE — Progress Notes (Signed)
PROGRESS NOTE    [name]  ZOX:[id number] DOB: [birth year] DOA: 08/30/2020 PCP: [name], NP   Brief Narrative: 87 year old with past medical history significant for COPD, hypertension, CAD, lumbar spinal stenosis, mild aortic stenosis, hypothyroidism who presents to [facility] complaining of shortness of breath.  She reports shortness of breath on exertion, unable to identify for how long she has been experiencing these symptoms.  She does report 3 to 4 days history of generalized malaise and fatigue.  Developed worsening shortness of breath today while watching TV.  EMS was contacted and patient was found to be in A. Fib.  Evaluation in the ED: Chest x-ray revealed left-sided pleural effusion with associated atelectasis of the left lower lobe.  CT angio was negative for PE with evidence of emphysema and left-sided pleural effusion.  Troponin mildly  elevated at 82.   Assessment & Plan:   Principal Problem:   Acute systolic CHF (congestive heart failure) (HCC) Active Problems:   Hypothyroidism   Mixed hyperlipidemia   Essential hypertension   COPD with emphysema (HCC)   Neurogenic claudication due to lumbar spinal stenosis   Mild aortic stenosis   Elevated troponin   Pleural effusion on left  1-Sinus Arrhythmia with PAC:  -Cardiology consulted, per their review no A fib.  -ECHO; E 45 %, grade one diastolic dysfunction. Moderate pleural effusion. Moderate Aortic Valve stenosis,  -CT angio negative for PE. -Mild elevation of troponin.  -Stable.   2-Acute combine Systolic and  diastolic heart failure exacerbation: Presented with SOB, BNP 348, mild elevation troponin. Chest X ray: Small left pleural effusion with basilar atelectasis or pneumonia. Mild cardiomegaly. Patient on IV lasix to 40 mg IV BID.  Cardiology consulted.  Negative 3.7 L. Weight: 133----126. New decrease EF 45 %, plan to treat medically. No plan for cath. Medical management: patient on aspirin and  Lipitor.  Started on Imdur. Continue with hydralazine.  Plan to transition to oral lasix today. Repeat B-met in am.  Monitor renal function. Cr increase to 1.4. --16  3-Hypothyroidism: Continue with Synthroid.   4-Hyperlipidemia: Continue with Lipitor.   5-Hypertension: Continue with hydralazine,  norvasc.   6-COPD: No significant evidence of exacerbation.  Continue with low dose prednisone, Incruse, Duoneb PRN.   Neurogenic claudication due to lumbar spinal stenosis: PT, consult.  Tylenol PRN pain.   Mild elevation of troponin: Suspect related to HF.  Pleural effusion on the left; Continue with IV lasix.   Mild aortic stenosis: ECHO moderate aortic stenosis by ECHO.  Diuresis.   Hypokalemia: Resolved.   AKI;  Suspect related to over diuresis.  Cr increase to 1.6 Lasix change to oral.  Repeat labs in am.   Urinary retention: Had In and out cath last night 800 and 1 L out put.  Monitor urine out put.   Constipation; start senna, received mira lax.     Estimated body mass index is 20.42 kg/m as calculated from the following:   Height as of this encounter: $RemoveBeforeD'5\' 6"'[id number]$  (1.676 m).   Weight as of this encounter: 57.4 kg.   DVT prophylaxis: Lovenox Code Status: DNR Family Communication: Daughter updated 2/27 Disposition Plan:  Status WJ:[id number].   The patient will require care spanning > 2 midnights and should be moved to inpatient because: IV treatments appropriate due to intensity of illness or inability to take PO  Dispo: The patient is from: Home              Anticipated d/c is to:  SNF              Anticipated d/c date is: 1 day              Patient currently is not medically stable to d/c.   Difficult to place patient No        Consultants:   Cardiology   Procedures:   ECHO  Antimicrobials:    Subjective: She denies dyspnea at rest, report dyspnea on exertion still.  She denies chest pain.   Objective: Vitals:   09/02/20 2111  09/02/20 2300 09/03/20 0300 09/03/20 0500  BP: 121/68 105/61 (!) 107/55   Pulse:  63 64   Resp:  17 19   Temp:  99 F (37.2 C) 98.6 F (37 C)   TempSrc:  Oral Oral   SpO2:  94% 95%   Weight:    57.4 kg  Height:        Intake/Output Summary (Last 24 hours) at 09/03/2020 0711 Last data filed at 09/02/2020 1800 Gross per 24 hour  Intake 940 ml  Output 801 ml  Net 139 ml   Filed Weights   08/30/20 1655 09/03/20 0500  Weight: 60.3 kg 57.4 kg    Examination:  General exam: NAD Respiratory system: CTA Cardiovascular system: S 1, S 2 RRR Gastrointestinal system: BS present, soft, nt Central nervous system: alert Extremities: Trace edema    Data Reviewed: I have personally reviewed following labs and imaging studies  CBC: Recent Labs  Lab 08/30/20 1657 08/30/20 1758 09/02/20 0809 09/03/20 0059  WBC 5.6  --  6.1 5.6  NEUTROABS 4.3  --   --   --   HGB 12.9 12.6 12.8 11.8*  HCT 38.2 37.0 39.7 35.0*  MCV 92.7  --  93.6 92.6  PLT 176  --  175 497   Basic Metabolic Panel: Recent Labs  Lab 08/30/20 1657 08/30/20 1758 08/31/20 0320 09/02/20 0809 09/03/20 0059  NA 132* 133* 136 134* 134*  K 3.8 4.0 3.2* 4.5 4.5  CL 99  --  101 101 99  CO2 24  --  $R'25 24 28  'Hj$ GLUCOSE 147*  --  102* 149* 102*  BUN 18  --  16 28* 33*  CREATININE 1.06*  --  1.17* 1.43* 1.64*  CALCIUM 8.7*  --  8.5* 8.8* 8.4*  MG 2.1  --  1.9  --   --    GFR: Estimated Creatinine Clearance: 19.4 mL/min (A) (by C-G formula based on SCr of 1.64 mg/dL (H)). Liver Function Tests: Recent Labs  Lab 08/31/20 0320  AST 18  ALT 16  ALKPHOS 69  BILITOT 0.9  PROT 4.9*  ALBUMIN 3.0*   No results for input(s): LIPASE, AMYLASE in the last 168 hours. No results for input(s): AMMONIA in the last 168 hours. Coagulation Profile: Recent Labs  Lab 08/31/20 0320  INR 1.1   Cardiac Enzymes: No results for input(s): CKTOTAL, CKMB, CKMBINDEX, TROPONINI in the last 168 hours. BNP (last 3 results) No results  for input(s): PROBNP in the last 8760 hours. HbA1C: No results for input(s): HGBA1C in the last 72 hours. CBG: No results for input(s): GLUCAP in the last 168 hours. Lipid Profile: No results for input(s): CHOL, HDL, LDLCALC, TRIG, CHOLHDL, LDLDIRECT in the last 72 hours. Thyroid Function Tests: No results for input(s): TSH, T4TOTAL, FREET4, T3FREE, THYROIDAB in the last 72 hours. Anemia Panel: No results for input(s): VITAMINB12, FOLATE, FERRITIN, TIBC, IRON, RETICCTPCT in the last 72 hours. Sepsis Labs:  Recent Labs  Lab 08/30/20 2040 08/31/20 0320 09/01/20 0012  PROCALCITON <0.10 <0.10 <0.10    Recent Results (from the past 240 hour(s))  Blood culture (routine x 2)     Status: None (Preliminary result)   Collection Time: 08/30/20  8:40 PM   Specimen: BLOOD RIGHT ARM  Result Value Ref Range Status   Specimen Description BLOOD RIGHT ARM  Final   Special Requests   Final    BOTTLES DRAWN AEROBIC AND ANAEROBIC Blood Culture adequate volume   Culture   Final    NO GROWTH 3 DAYS Performed at [facility], [place] [address], [place], [place] [address]    Report Status PENDING  Incomplete  Blood culture (routine x 2)     Status: None (Preliminary result)   Collection Time: 08/30/20  8:45 PM   Specimen: BLOOD RIGHT HAND  Result Value Ref Range Status   Specimen Description BLOOD RIGHT HAND  Final   Special Requests   Final    BOTTLES DRAWN AEROBIC AND ANAEROBIC Blood Culture adequate volume   Culture   Final    NO GROWTH 3 DAYS Performed at [facility], [place] [address] [place], [place] [address]    Report Status PENDING  Incomplete  SARS CORONAVIRUS 2 (TAT 6-24 HRS) Nasopharyngeal Nasopharyngeal Swab     Status: None   Collection Time: 08/30/20 10:15 PM   Specimen: Nasopharyngeal Swab  Result Value Ref Range Status   SARS Coronavirus 2 NEGATIVE NEGATIVE Final    Comment: (NOTE) SARS-CoV-2 target nucleic acids are NOT DETECTED.  The SARS-CoV-2 RNA is generally  detectable in upper and lower respiratory specimens during the acute phase of infection. Negative results do not preclude SARS-CoV-2 infection, do not rule out co-infections with other pathogens, and should not be used as the sole basis for treatment or other patient management decisions. Negative results must be combined with clinical observations, patient history, and epidemiological information. The expected result is Negative.  Fact Sheet for Patients: SugarRoll.be  Fact Sheet for Healthcare Providers: https://www.woods-mathews.com/  This test is not yet approved or cleared by the [place] FDA and  has been authorized for detection and/or diagnosis of SARS-CoV-2 by FDA under an Emergency Use Authorization (EUA). This EUA will remain  in effect (meaning this test can be used) for the duration of the COVID-19 declaration under Se ction 564(b)(1) of the Act, 21 U.S.C. section 360bbb-3(b)(1), unless the authorization is terminated or revoked sooner.  Performed at [facility], [place] [address], [place], [place] [address]   MRSA PCR Screening     Status: None   Collection Time: 09/01/20  9:19 AM   Specimen: Nasopharyngeal  Result Value Ref Range Status   MRSA by PCR NEGATIVE NEGATIVE Final    Comment:        The GeneXpert MRSA Assay (FDA approved for NASAL specimens only), is one component of a comprehensive MRSA colonization surveillance program. It is not intended to diagnose MRSA infection nor to guide or monitor treatment for MRSA infections. Performed at [facility], [place] [address] [place], [place] [address]          Radiology Studies: No results found.      Scheduled Meds: . amLODipine  5 mg Oral Daily  . atorvastatin  80 mg Oral QPM  . enoxaparin (LOVENOX) injection  30 mg Subcutaneous Q24H  . fluticasone furoate-vilanterol  1 puff Inhalation Daily  . hydrALAZINE  75 mg Oral TID  .  isosorbide  mononitrate  30 mg Oral Daily  . latanoprost  1 drop Both Eyes QHS  . levothyroxine  75 mcg Oral Q0600  . pantoprazole  40 mg Oral Daily  . predniSONE  5 mg Oral Q breakfast  . senna  1 tablet Oral BID  . umeclidinium bromide  1 puff Inhalation Daily   Continuous Infusions:   LOS: 2 days    Time spent: 35 minutes.     Elmarie Shiley, MD [facility]   If 7PM-7AM, please contact night-coverage [email or web address]  09/03/2020, 7:11 AM

## 2020-09-03 NOTE — Plan of Care (Signed)
  Problem: Education: Goal: Knowledge of General Education information will improve Description: Including pain rating scale, medication(s)/side effects and non-pharmacologic comfort measures Outcome: Progressing   Problem: Health Behavior/Discharge Planning: Goal: Ability to manage health-related needs will improve Outcome: Progressing   Problem: Clinical Measurements: Goal: Ability to maintain clinical measurements within normal limits will improve Outcome: Progressing   Problem: Clinical Measurements: Goal: Diagnostic test results will improve Outcome: Progressing   Problem: Activity: Goal: Risk for activity intolerance will decrease Outcome: Progressing   Problem: Clinical Measurements: Goal: Cardiovascular complication will be avoided Outcome: Progressing   Problem: Nutrition: Goal: Adequate nutrition will be maintained Outcome: Progressing   Problem: Pain Managment: Goal: General experience of comfort will improve Outcome: Progressing   Problem: Skin Integrity: Goal: Risk for impaired skin integrity will decrease Outcome: Progressing

## 2020-09-03 NOTE — TOC Progression Note (Addendum)
Transition of Care [facility]) - Progression Note    Patient Details  Name: [name] MRN: [id number] Date of Birth: September 24, 1924  Transition of Care [facility]) CM/SW Contact  [name], [place] Phone Number: [phone] 09/03/2020, 3:10 PM  Clinical Narrative:    Phone call to patient's daughter [name] to discuss bed offers. Patient's daughter has declined [facility] and [facility]. Patient's daughter would prefer [name], [facility], [facility] or [facility]. If they are not available, she will opt to take patient home with [facility] services(OT, PT and an Aid). Patient's daughter also requesting a bedside commode in the event patient discharges home.  Patient's daughter confirms that patient has a stair lift, a wheelchair ramp, and her bathroom is accessible.   Transition of Care to continue to follow for discharge needs.   [address], LCSW Transition of Care [phone]     Expected Discharge Plan: Skilled Nursing Facility Barriers to Discharge: Continued Medical Work up  Expected Discharge Plan and Services Expected Discharge Plan: [place] arrangements for the past 2 months: Single Family Home                                       Social Determinants of Health (SDOH) Interventions    Readmission Risk Interventions No flowsheet data found.

## 2020-09-04 ENCOUNTER — Other Ambulatory Visit: Payer: Self-pay | Admitting: Nurse Practitioner

## 2020-09-04 ENCOUNTER — Other Ambulatory Visit: Payer: Self-pay | Admitting: Physician Assistant

## 2020-09-04 DIAGNOSIS — I5021 Acute systolic (congestive) heart failure: Secondary | ICD-10-CM | POA: Diagnosis not present

## 2020-09-04 DIAGNOSIS — I6523 Occlusion and stenosis of bilateral carotid arteries: Secondary | ICD-10-CM

## 2020-09-04 LAB — BASIC METABOLIC PANEL
Anion gap: 11 (ref 5–15)
BUN: 36 mg/dL — ABNORMAL HIGH (ref 8–23)
CO2: 26 mmol/L (ref 22–32)
Calcium: 8.4 mg/dL — ABNORMAL LOW (ref 8.9–10.3)
Chloride: 99 mmol/L (ref 98–111)
Creatinine, Ser: 1.46 mg/dL — ABNORMAL HIGH (ref 0.44–1.00)
GFR, Estimated: 33 mL/min — ABNORMAL LOW (ref >60)
Glucose, Bld: 109 mg/dL — ABNORMAL HIGH (ref 70–99)
Potassium: 3.5 mmol/L (ref 3.5–5.1)
Sodium: 136 mmol/L (ref 135–145)

## 2020-09-04 LAB — CULTURE, BLOOD (ROUTINE X 2)
Culture: NO GROWTH
Culture: NO GROWTH
Special Requests: ADEQUATE
Special Requests: ADEQUATE

## 2020-09-04 MED ORDER — FUROSEMIDE 20 MG PO TABS: 20.0000 mg | ORAL_TABLET | Freq: Every day | ORAL | 3 refills | Status: DC

## 2020-09-04 MED ORDER — ASPIRIN 81 MG PO CHEW: 81.0000 mg | CHEWABLE_TABLET | Freq: Every day | ORAL | 3 refills | Status: DC

## 2020-09-04 MED ORDER — FUROSEMIDE 20 MG PO TABS: 20.0000 mg | ORAL_TABLET | Freq: Every day | ORAL | Status: DC

## 2020-09-04 MED ORDER — ISOSORBIDE MONONITRATE ER 30 MG PO TB24: 30.0000 mg | ORAL_TABLET | Freq: Every day | ORAL | 3 refills | Status: DC

## 2020-09-04 MED ORDER — PANTOPRAZOLE SODIUM 40 MG PO TBEC: 40.0000 mg | DELAYED_RELEASE_TABLET | Freq: Every day | ORAL | 2 refills | Status: DC

## 2020-09-04 MED ORDER — POTASSIUM CHLORIDE CRYS ER 20 MEQ PO TBCR
20.0000 meq | EXTENDED_RELEASE_TABLET | Freq: Every day | ORAL | 3 refills | Status: DC
Start: 2020-09-04 — End: 2020-12-07

## 2020-09-04 NOTE — Discharge Instructions (Signed)

## 2020-09-04 NOTE — Progress Notes (Signed)
Discharge instructions reviewed with patient and Daughter [name].  Both verbalized understanding and written copy with important appointments and medications highlighted.  Patient via wheelchair to daughter's waiting car in stable condition.

## 2020-09-04 NOTE — Progress Notes (Signed)
Occupational Therapy Treatment Patient Details Name: [name] MRN: [id number] DOB: 22-May-1925 Today's Date: 09/04/2020    History of present illness Pt is a 87 y/o female admitted secondary to worsening SOB and fatigue. Found to have new onset a fib and L pleural effusion. PMH includes COPD, HTN, CAD, and lumbar stenosis.   OT comments  Pt progressing well towards OT goals this session. Pt able to get dressed completely with min A for socks, transfers and mobility. Pt with SOB with activity but SpO2 remained above 92% throughout. Pt educated on energy conservation and will benefit from skilled OT in the [facility] setting post-acute.    Follow Up Recommendations  Home health OT;Supervision/Assistance - 24 hour    Equipment Recommendations  None recommended by OT    Recommendations for Other Services      Precautions / Restrictions Precautions Precautions: Fall Restrictions Weight Bearing Restrictions: No       Mobility Bed Mobility Overal bed mobility: Needs Assistance Bed Mobility: Supine to Sit     Supine to sit: Supervision          Transfers Overall transfer level: Needs assistance Equipment used: 1 person hand held assist Transfers: Sit to/from Stand Sit to Stand: Supervision              Balance Overall balance assessment: Needs assistance Sitting-balance support: No upper extremity supported;Feet supported Sitting balance-Leahy Scale: Good     Standing balance support: Bilateral upper extremity supported Standing balance-Leahy Scale: Poor Standing balance comment: Reliant on BUE support                           ADL either performed or assessed with clinical judgement   ADL Overall ADL's : Needs assistance/impaired                 Upper Body Dressing : Sitting;Min guard Upper Body Dressing Details (indicate cue type and reason): able to don shirts Lower Body Dressing: Minimal assistance;Sit to/from stand Lower Body Dressing  Details (indicate cue type and reason): assist for socks, able to don pants Toilet Transfer: RW;Supervision/safety   Toileting- Clothing Manipulation and Hygiene: Set up;Sit to/from Nurse, children's Details (indicate cue type and reason): Pt has shower chair and plans on using it Functional mobility during ADLs: Min guard;Rolling walker General ADL Comments: SOB with minimal activity, SpO2 WFL     Vision       Perception     Praxis      Cognition Arousal/Alertness: Awake/alert Behavior During Therapy: WFL for tasks assessed/performed Overall Cognitive Status: Within Functional Limits for tasks assessed (most likely at baseline)                                          Exercises     Shoulder Instructions       General Comments      Pertinent Vitals/ Pain       Pain Assessment: No/denies pain Pain Intervention(s): Monitored during session  Home Living                                          Prior Functioning/Environment              Frequency  Min 2X/week        Progress Toward Goals  OT Goals(current goals can now be found in the care plan section)  Progress towards OT goals: Progressing toward goals  Acute Rehab OT Goals Patient Stated Goal: to go home OT Goal Formulation: With patient/family Time For Goal Achievement: 09/14/20 Potential to Achieve Goals: Good  Plan Discharge plan remains appropriate    Co-evaluation                 AM-PAC OT "6 Clicks" Daily Activity     Outcome Measure   Help from another person eating meals?: None Help from another person taking care of personal grooming?: A Little Help from another person toileting, which includes using toliet, bedpan, or urinal?: A Lot Help from another person bathing (including washing, rinsing, drying)?: A Little Help from another person to put on and taking off regular upper body clothing?: A Little Help from another person to  put on and taking off regular lower body clothing?: A Little 6 Click Score: 18    End of Session Equipment Utilized During Treatment: Gait belt;Rolling walker  OT Visit Diagnosis: Unsteadiness on feet (R26.81);Muscle weakness (generalized) (M62.81);Pain   Activity Tolerance Patient tolerated treatment well   Patient Left in bed;with call bell/phone within reach;with family/visitor present (sitting EOB)   Nurse Communication Mobility status        Time: 5456-2563 OT Time Calculation (min): 41 min  Charges: OT General Charges $OT Visit: 1 Visit OT Treatments $Self Care/Home Management : 23-37 mins $Therapeutic Activity: 8-22 mins  Jesse Sans OTR/L Acute Rehabilitation Services Pager: [phone] Office: [place] 09/04/2020, 11:06 AM

## 2020-09-04 NOTE — Progress Notes (Signed)
Progress Note  Patient Name: [name] Date of Encounter: 09/04/2020  [facility] Cardiologist: Jenkins Rouge, MD   Subjective   No CP or dyspnea  Inpatient Medications    Scheduled Meds: . aspirin  81 mg Oral Daily  . atorvastatin  80 mg Oral QPM  . enoxaparin (LOVENOX) injection  30 mg Subcutaneous Q24H  . fluticasone furoate-vilanterol  1 puff Inhalation Daily  . furosemide  40 mg Oral Daily  . hydrALAZINE  75 mg Oral TID  . isosorbide mononitrate  30 mg Oral Daily  . latanoprost  1 drop Both Eyes QHS  . levothyroxine  75 mcg Oral Q0600  . pantoprazole  40 mg Oral Daily  . predniSONE  5 mg Oral Q breakfast  . senna  1 tablet Oral BID  . umeclidinium bromide  1 puff Inhalation Daily   Continuous Infusions:  PRN Meds: acetaminophen, alum & mag hydroxide-simeth, bisacodyl, hydrALAZINE, ipratropium-albuterol, ondansetron (ZOFRAN) IV, polyethylene glycol   Vital Signs    Vitals:   09/03/20 1918 09/03/20 2100 09/03/20 2315 09/04/20 0228  BP: 126/80 119/64 (!) 132/52 (!) 149/85  Pulse: 77  74 76  Resp: 20  20 14   Temp: 97.8 F (36.6 C)  98 F (36.7 C) (!) 97.4 F (36.3 C)  TempSrc: Oral  Oral Oral  SpO2: 94%  96% 93%  Weight:    53.3 kg  Height:        Intake/Output Summary (Last 24 hours) at 09/04/2020 0717 Last data filed at 09/03/2020 1700 Gross per 24 hour  Intake 350 ml  Output 350 ml  Net 0 ml   Last 3 Weights 09/04/2020 09/03/2020 08/30/2020  Weight (lbs) 117 lb 8.1 oz 126 lb 8.7 oz 133 lb  Weight (kg) 53.3 kg 57.4 kg 60.328 kg      Telemetry    Sinus with pacs- Personally Reviewed  Physical Exam   GEN: Elderly, NAD Neck: supple, no JVD Cardiac: RRR, 2/6 systolic murmur Respiratory: CTA; no wheeze GI: Soft, NT/ND, no masses MS: No edema Neuro:  No focal findings Psych: Normal affect   Labs    High Sensitivity Troponin:   Recent Labs  Lab 08/30/20 1657 08/30/20 1857  TROPONINIHS 82* 73*      Chemistry Recent Labs  Lab  08/31/20 0320 09/02/20 0809 09/03/20 0059 09/04/20 0039  NA 136 134* 134* 136  K 3.2* 4.5 4.5 3.5  CL 101 101 99 99  CO2 25 24 28 26   GLUCOSE 102* 149* 102* 109*  BUN 16 28* 33* 36*  CREATININE 1.17* 1.43* 1.64* 1.46*  CALCIUM 8.5* 8.8* 8.4* 8.4*  PROT 4.9*  --   --   --   ALBUMIN 3.0*  --   --   --   AST 18  --   --   --   ALT 16  --   --   --   ALKPHOS 69  --   --   --   BILITOT 0.9  --   --   --   GFRNONAA 44* 34* 29* 33*  ANIONGAP 10 9 7 11      Hematology Recent Labs  Lab 08/30/20 1657 08/30/20 1758 09/02/20 0809 09/03/20 0059  WBC 5.6  --  6.1 5.6  RBC 4.12  --  4.24 3.78*  HGB 12.9 12.6 12.8 11.8*  HCT 38.2 37.0 39.7 35.0*  MCV 92.7  --  93.6 92.6  MCH 31.3  --  30.2 31.2  MCHC 33.8  --  32.2 33.7  RDW 13.2  --  13.2 13.5  PLT 176  --  175 171    BNP Recent Labs  Lab 08/30/20 1657  BNP 348.6*     Patient Profile     87 y.o. female with past medical history of hypertension, carotid artery disease, aortic stenosis, hypertension, hyperlipidemia, COPD, chronic kidney disease, prior GI bleed admitted with acute on chronic combined systolic/diastolic congestive heart failure.  Echocardiogram shows ejection fraction 40 to 40%, grade 1 diastolic dysfunction, mild left atrial enlargement, mild mitral regurgitation, moderate aortic stenosis with mean gradient 20 mmHg, trace aortic insufficiency.  Assessment & Plan    1 acute on chronic combined systolic/diastolic congestive heart failure-Wt 53.3 kg. Symptoms resolved.  We will hold Lasix today and resume at 20 mg daily tomorrow.  Check potassium and renal function 1 week following discharge.  2 cardiomyopathy-ejection fraction 40 to 45%.  However given patient's age and comorbidities plan is conservative management.  She was not on a beta-blocker previously as she has had bradycardia with syncope.  She had anaphylaxis with ACE inhibitor in the past.  Continue hydralazine/nitrates.  3 coronary artery  disease-patient noted to have calcification in her coronaries and aorta.  Plan is medical therapy.  Continue aspirin and statin.  4 moderate aortic stenosis-she will need follow-up as an outpatient.  5 elevated troponin-felt likely to be demand ischemia.  No further ischemia evaluation anticipated.  6 hypertension-blood pressure controlled.  Continue present medications and follow.  Patient can be discharged from a cardiac standpoint.  Would resume Lasix 20 mg daily tomorrow.  Check potassium and renal function 1 week following discharge.  We will arrange follow-up with APP in 2 weeks.  Follow-up with Dr. Johnsie Cancel in 3 months.  Cardiology will sign off.  Please call with questions.  For questions or updates, please contact [place] Please consult [email or web address] for contact info under        Signed, Kirk Ruths, MD  09/04/2020, 7:17 AM

## 2020-09-04 NOTE — Discharge Summary (Addendum)
Physician Discharge Summary  JAQUIA BENEDICTO FUX:[id number] DOB: [birth year] DOA: 08/30/2020  PCP: [name], NP  Admit date: 08/30/2020 Discharge date: 09/04/2020  Admitted From: Home  Disposition:  Home   Recommendations for Outpatient Follow-up:  1. Follow up with PCP in 1-2 weeks 2. Please obtain BMP/CBC in one week 3. Follow up with cardiology for further care of HF  Home Health: yes.   Discharge Condition: Stable.  CODE STATUS: DNR Diet recommendation: Heart Healthy  Brief/Interim Summary: 87 year old with past medical history significant for COPD, hypertension, CAD, lumbar spinal stenosis, mild aortic stenosis, hypothyroidism who presents to [facility] complaining of shortness of breath.  She reports shortness of breath on exertion, unable to identify for how long she has been experiencing these symptoms.  She does report 3 to 4 days history of generalized malaise and fatigue.  Developed worsening shortness of breath today while watching TV.  EMS was contacted and patient was found to be in A. Fib.  Evaluation in the ED: Chest x-ray revealed left-sided pleural effusion with associated atelectasis of the left lower lobe.  CT angio was negative for PE with evidence of emphysema and left-sided pleural effusion.  Troponin mildly  elevated at 82.  1-Sinus Arrhythmia with PAC:  -Cardiology consulted, per their review no A fib.  -ECHO; E 45 %, grade one diastolic dysfunction. Moderate pleural effusion. Moderate Aortic Valve stenosis,  -CT angio negative for PE. -Mild elevation of troponin.  -Stable.   2-Acute combine Systolic and  diastolic heart failure exacerbation: Presented with SOB, BNP 348, mild elevation troponin. Chest X ray: Small left pleural effusion with basilar atelectasis or pneumonia. Mild cardiomegaly. Received IV lasix to 40 mg IV BID while in the hospital. .  Cardiology consulted.  Negative 3.7 L. Weight: 133----126.--117 New decrease EF 45 %, plan to treat  medically. No plan for cath. Medical management: patient on aspirin and Lipitor.  Started on Imdur. Continue with hydralazine.  lasix held today by cardiology. Plan to resume tomorrow 20 mg daily. Will provide prescription for potassium.   3-Hypothyroidism: Continue with Synthroid.   4-Hyperlipidemia: Continue with Lipitor.   5-Hypertension: Continue with hydralazine,  norvasc.   6-COPD: No significant evidence of exacerbation.  Continue with low dose prednisone, Incruse, Duoneb PRN.   Neurogenic claudication due to lumbar spinal stenosis: PT, consult.  Tylenol PRN pain.   Mild elevation of troponin: Suspect related to HF.  Pleural effusion on the left; Continue with IV lasix.   Mild aortic stenosis: ECHO moderate aortic stenosis by ECHO.  Diuresis.   Hypokalemia: Resolved.   AKI; on CKD stage IIIa.  Suspect related to over diuresis.  Cr increase to 1.6 Lasix change to oral. Cr down to 1.4  Repeat labs in am.   Urinary retention: Had In and out cath last night 800 and 1 L out put.  Monitor urine out put.  She was able to urinate.   Constipation; started  senna, received mira lax.      Discharge Diagnoses:  Principal Problem:   Acute systolic CHF (congestive heart failure) (HCC) Active Problems:   Hypothyroidism   Mixed hyperlipidemia   Essential hypertension   COPD with emphysema (HCC)   Neurogenic claudication due to lumbar spinal stenosis   Mild aortic stenosis   Elevated troponin   Pleural effusion on left    Discharge Instructions  Discharge Instructions    Diet - low sodium heart healthy   Complete by: As directed    Increase activity slowly  Complete by: As directed      Allergies as of 09/04/2020      Reactions   Catapres [clonidine Hcl] Other (See Comments)   Made pt feel horrible, shaky, weak and nausea   Lisinopril Anaphylaxis   Tongue swelling   Labetalol Hcl Other (See Comments)   Caused bradycardia and syncope       Medication List    TAKE these medications   AeroChamber MV inhaler Use as instructed What changed:   how much to take  how to take this  when to take this  additional instructions   albuterol 108 (90 Base) MCG/ACT inhaler Commonly known as: ProAir HFA Inhale 2 puffs into the lungs every 6 (six) hours as needed for wheezing or shortness of breath.   aspirin 81 MG chewable tablet Chew 1 tablet (81 mg total) by mouth daily.   atorvastatin 80 MG tablet Commonly known as: LIPITOR TAKE 1 TABLET BY MOUTH EVERY DAY AT 6PM What changed: See the new instructions.   furosemide 20 MG tablet Commonly known as: LASIX Take 1 tablet (20 mg total) by mouth daily. Start taking on: September 05, 2020   hydrALAZINE 25 MG tablet Commonly known as: APRESOLINE TAKE 3 TABLETS BY MOUTH 3 TIMES A DAY What changed: See the new instructions.   ipratropium-albuterol 0.5-2.5 (3) MG/3ML Soln Commonly known as: DUONEB Take 3 mLs by nebulization every 6 (six) hours as needed (copd).   isosorbide mononitrate 30 MG 24 hr tablet Commonly known as: IMDUR Take 1 tablet (30 mg total) by mouth daily.   latanoprost 0.005 % ophthalmic solution Commonly known as: XALATAN Place 1 drop into both eyes at bedtime.   levothyroxine 75 MCG tablet Commonly known as: SYNTHROID Take 1 tablet (75 mcg total) by mouth daily before breakfast.   pantoprazole 40 MG tablet Commonly known as: PROTONIX Take 1 tablet (40 mg total) by mouth daily.   potassium chloride SA 20 MEQ tablet Commonly known as: KLOR-CON Take 1 tablet (20 mEq total) by mouth daily.   predniSONE 5 MG tablet Commonly known as: DELTASONE Take 5-10 mg by mouth daily with breakfast.   Trelegy Ellipta 100-62.5-25 MCG/INH Aepb Generic drug: Fluticasone-Umeclidin-Vilant INHALE 1 PUFF BY MOUTH EVERY DAY What changed: See the new instructions.            Durable Medical Equipment  (From admission, onward)         Start     Ordered    09/04/20 0902  For home use only DME Bedside commode  Once       Question:  Patient needs a bedside commode to treat with the following condition  Answer:  Leg weakness   09/04/20 0902          Allergies  Allergen Reactions  . Catapres [Clonidine Hcl] Other (See Comments)    Made pt feel horrible, shaky, weak and nausea  . Lisinopril Anaphylaxis    Tongue swelling  . Labetalol Hcl Other (See Comments)    Caused bradycardia and syncope    Consultations:  Cardiology    Procedures/Studies: DG Chest 2 View  Result Date: 08/30/2020 CLINICAL DATA:  Shortness of breath EXAM: CHEST - 2 VIEW COMPARISON:  03/27/2020 FINDINGS: Small left-sided pleural effusion with basilar airspace disease. Mild cardiomegaly with aortic atherosclerosis. Coarse chronic interstitial opacity. No pneumothorax. IMPRESSION: Small left pleural effusion with basilar atelectasis or pneumonia. Mild cardiomegaly. Electronically Signed   By: Donavan Foil M.D.   On: 08/30/2020 17:48   CT  Angio Chest PE W and/or Wo Contrast  Result Date: 08/30/2020 CLINICAL DATA:  Dyspnea EXAM: CT ANGIOGRAPHY CHEST WITH CONTRAST TECHNIQUE: Multidetector CT imaging of the chest was performed using the standard protocol during bolus administration of intravenous contrast. Multiplanar CT image reconstructions and MIPs were obtained to evaluate the vascular anatomy. CONTRAST:  75mL OMNIPAQUE IOHEXOL 350 MG/ML SOLN COMPARISON:  Chest x-ray 08/30/2020, CT chest 01/14/2020, CT chest 04/03/2018 FINDINGS: Cardiovascular: Satisfactory opacification of the pulmonary arteries to the segmental level. No evidence of pulmonary embolism. Extensive aortic atherosclerosis and irregular mural disease. No dissection is seen. Negative for aortic aneurysm. Coronary vascular calcification. Mild cardiomegaly with trace pericardial effusion. Mediastinum/Nodes: Midline trachea. No suspicious thyroid nodule. No significant mediastinal adenopathy. Esophagus within  normal limits. Lungs/Pleura: Emphysema. Small left-sided pleural effusion, slightly increased compared to prior. Favor dependent atelectasis left lower lobe over pneumonia. Scattered areas of scarring within the bilateral upper lobes. Probable scarring in the right lower lobe. Upper Abdomen: No acute abnormality. Musculoskeletal: No chest wall abnormality. No acute or significant osseous findings. Review of the MIP images confirms the above findings. IMPRESSION: 1. Negative for acute pulmonary embolus or aortic dissection. 2. Emphysema. Small left-sided pleural effusion, slightly increased compared to prior. Partial consolidation left lower lobe, favor atelectasis over pneumonia. 3. Cardiomegaly with trace pericardial effusion. 4. Emphysema and aortic atherosclerosis. Aortic Atherosclerosis (ICD10-I70.0) and Emphysema (ICD10-J43.9). Electronically Signed   By: Donavan Foil M.D.   On: 08/30/2020 20:20   ECHOCARDIOGRAM COMPLETE  Result Date: 08/31/2020    ECHOCARDIOGRAM REPORT   Patient Name:   DENESIA DONELAN Date of Exam: 08/31/2020 Medical Rec #:  [id number]     Height:       66.0 in Accession #:    [id number]    Weight:       133.0 lb Date of Birth:  11-Mar-1925    BSA:          1.681 m Patient Age:    14 years      BP:           150/58 mmHg Patient Gender: F             HR:           77 bpm. Exam Location:  Inpatient Procedure: 2D Echo, Cardiac Doppler and Color Doppler Indications:    I48.0 Paroxysmal atrial fibrillation  History:        Patient has prior history of Echocardiogram examinations, most                 recent 01/13/2018. Stroke, Carotid Disease and COPD,                 Signs/Symptoms:Dyspnea; Risk Factors:Hypertension, Dyslipidemia                 and Former Smoker. Graves' Disease. CKD.  Sonographer:    Jonelle Sidle Dance Referring Phys: [id number] [place]  1. Left ventricular ejection fraction, by estimation, is 40 to 45%. The left ventricle has mildly decreased function. The left  ventricle has no regional wall motion abnormalities. Left ventricular diastolic parameters are consistent with Grade I diastolic dysfunction (impaired relaxation).  2. Right ventricular systolic function is normal. The right ventricular size is normal.  3. Left atrial size was mildly dilated.  4. Moderate pleural effusion.  5. The mitral valve is normal in structure. Mild mitral valve regurgitation. No evidence of mitral stenosis.  6. Fixed right coronary cusp. The aortic valve is tricuspid. There  is moderate calcification of the aortic valve. There is moderate thickening of the aortic valve. Aortic valve regurgitation is trivial. Moderate aortic valve stenosis. Aortic valve mean gradient measures 19.5 mmHg. Aortic valve Vmax measures 3.04 m/s.  7. The inferior vena cava is normal in size with greater than 50% respiratory variability, suggesting right atrial pressure of 3 mmHg. Conclusion(s)/Recommendation(s): No intracardiac source of embolism detected on this transthoracic study. A transesophageal echocardiogram is recommended to exclude cardiac source of embolism if clinically indicated. FINDINGS  Left Ventricle: Left ventricular ejection fraction, by estimation, is 40 to 45%. The left ventricle has mildly decreased function. The left ventricle has no regional wall motion abnormalities. The left ventricular internal cavity size was normal in size. There is no left ventricular hypertrophy. Left ventricular diastolic parameters are consistent with Grade I diastolic dysfunction (impaired relaxation).  LV Wall Scoring: The inferior wall is akinetic. Right Ventricle: The right ventricular size is normal. No increase in right ventricular wall thickness. Right ventricular systolic function is normal. Left Atrium: Left atrial size was mildly dilated. Right Atrium: Right atrial size was normal in size. Pericardium: There is no evidence of pericardial effusion. Mitral Valve: The mitral valve is normal in structure. Mild  mitral valve regurgitation. No evidence of mitral valve stenosis. Tricuspid Valve: The tricuspid valve is normal in structure. Tricuspid valve regurgitation is not demonstrated. No evidence of tricuspid stenosis. Aortic Valve: Fixed right coronary cusp. The aortic valve is tricuspid. There is moderate calcification of the aortic valve. There is moderate thickening of the aortic valve. Aortic valve regurgitation is trivial. Aortic regurgitation PHT measures 321 msec. Moderate aortic stenosis is present. Aortic valve mean gradient measures 19.5 mmHg. Aortic valve peak gradient measures 36.8 mmHg. Aortic valve area, by VTI measures 0.78 cm. Pulmonic Valve: The pulmonic valve was normal in structure. Pulmonic valve regurgitation is not visualized. No evidence of pulmonic stenosis. Aorta: The aortic root is normal in size and structure. Venous: The inferior vena cava is normal in size with greater than 50% respiratory variability, suggesting right atrial pressure of 3 mmHg. IAS/Shunts: No atrial level shunt detected by color flow Doppler. Additional Comments: There is a moderate pleural effusion.  LEFT VENTRICLE PLAX 2D LVIDd:         4.00 cm LVIDs:         3.50 cm LV PW:         1.00 cm LV IVS:        0.90 cm LVOT diam:     1.90 cm LV SV:         50 LV SV Index:   30 LVOT Area:     2.84 cm  RIGHT VENTRICLE             IVC RV Basal diam:  2.70 cm     IVC diam: 1.90 cm RV S prime:     12.90 cm/s TAPSE (M-mode): 1.9 cm LEFT ATRIUM             Index       RIGHT ATRIUM           Index LA diam:        3.50 cm 2.08 cm/m  RA Area:     12.10 cm LA Vol (A2C):   61.2 ml 36.40 ml/m RA Volume:   26.60 ml  15.82 ml/m LA Vol (A4C):   57.6 ml 34.26 ml/m LA Biplane Vol: 57.7 ml 34.32 ml/m  AORTIC VALVE AV Area (Vmax):    0.87  cm AV Area (Vmean):   0.84 cm AV Area (VTI):     0.78 cm AV Vmax:           303.50 cm/s AV Vmean:          203.750 cm/s AV VTI:            0.641 m AV Peak Grad:      36.8 mmHg AV Mean Grad:      19.5  mmHg LVOT Vmax:         93.30 cm/s LVOT Vmean:        60.600 cm/s LVOT VTI:          0.176 m LVOT/AV VTI ratio: 0.27 AI PHT:            321 msec  AORTA Ao Root diam: 2.90 cm Ao Asc diam:  3.00 cm MITRAL VALVE MV Area (PHT): 2.48 cm     SHUNTS MV Decel Time: 306 msec     Systemic VTI:  0.18 m MV E velocity: 59.00 cm/s   Systemic Diam: 1.90 cm MV A velocity: 118.00 cm/s MV E/A ratio:  0.50 Candee Furbish MD Electronically signed by Candee Furbish MD Signature Date/Time: 08/31/2020/4:26:40 PM    Final       Subjective:   Discharge Exam: Vitals:   09/04/20 0228 09/04/20 0728  BP: (!) 149/85 (!) 152/94  Pulse: 76 87  Resp: 14 20  Temp: (!) 97.4 F (36.3 C) 97.6 F (36.4 C)  SpO2: 93% 94%     General: Pt is alert, awake, not in acute distress Cardiovascular: RRR, S1/S2 +, Respiratory: CTA bilaterally, no wheezing, no rhonchi Abdominal: Soft, NT, ND, bowel sounds + Extremities: no edema, no cyanosis    The results of significant diagnostics from this hospitalization (including imaging, microbiology, ancillary and laboratory) are listed below for reference.     Microbiology: Recent Results (from the past 240 hour(s))  Blood culture (routine x 2)     Status: None (Preliminary result)   Collection Time: 08/30/20  8:40 PM   Specimen: BLOOD RIGHT ARM  Result Value Ref Range Status   Specimen Description BLOOD RIGHT ARM  Final   Special Requests   Final    BOTTLES DRAWN AEROBIC AND ANAEROBIC Blood Culture adequate volume   Culture   Final    NO GROWTH 4 DAYS Performed at [facility], [address], [place], [place] [address]    Report Status PENDING  Incomplete  Blood culture (routine x 2)     Status: None (Preliminary result)   Collection Time: 08/30/20  8:45 PM   Specimen: BLOOD RIGHT HAND  Result Value Ref Range Status   Specimen Description BLOOD RIGHT HAND  Final   Special Requests   Final    BOTTLES DRAWN AEROBIC AND ANAEROBIC Blood Culture adequate volume   Culture    Final    NO GROWTH 4 DAYS Performed at [facility], [place] [address], [place], [place] [address]    Report Status PENDING  Incomplete  SARS CORONAVIRUS 2 (TAT 6-24 HRS) Nasopharyngeal Nasopharyngeal Swab     Status: None   Collection Time: 08/30/20 10:15 PM   Specimen: Nasopharyngeal Swab  Result Value Ref Range Status   SARS Coronavirus 2 NEGATIVE NEGATIVE Final    Comment: (NOTE) SARS-CoV-2 target nucleic acids are NOT DETECTED.  The SARS-CoV-2 RNA is generally detectable in upper and lower respiratory specimens during the acute phase of infection. Negative results do not preclude  SARS-CoV-2 infection, do not rule out co-infections with other pathogens, and should not be used as the sole basis for treatment or other patient management decisions. Negative results must be combined with clinical observations, patient history, and epidemiological information. The expected result is Negative.  Fact Sheet for Patients: SugarRoll.be  Fact Sheet for Healthcare Providers: https://www.woods-mathews.com/  This test is not yet approved or cleared by the [place] FDA and  has been authorized for detection and/or diagnosis of SARS-CoV-2 by FDA under an Emergency Use Authorization (EUA). This EUA will remain  in effect (meaning this test can be used) for the duration of the COVID-19 declaration under Se ction 564(b)(1) of the Act, 21 U.S.C. section 360bbb-3(b)(1), unless the authorization is terminated or revoked sooner.  Performed at [facility], [place] [address], [place], [place] [address]   MRSA PCR Screening     Status: None   Collection Time: 09/01/20  9:19 AM   Specimen: Nasopharyngeal  Result Value Ref Range Status   MRSA by PCR NEGATIVE NEGATIVE Final    Comment:        The GeneXpert MRSA Assay (FDA approved for NASAL specimens only), is one component of a comprehensive MRSA colonization surveillance program. It is  not intended to diagnose MRSA infection nor to guide or monitor treatment for MRSA infections. Performed at [facility], [place] [address], [place], [place] [address]      Labs: BNP (last 3 results) Recent Labs    01/14/20 1551 03/27/20 2043 08/30/20 1657  BNP 196.6* 205.3* 314.9*   Basic Metabolic Panel: Recent Labs  Lab 08/30/20 1657 08/30/20 1758 08/31/20 0320 09/02/20 0809 09/03/20 0059 09/04/20 0039  NA 132* 133* 136 134* 134* 136  K 3.8 4.0 3.2* 4.5 4.5 3.5  CL 99  --  101 101 99 99  CO2 24  --  25 24 28 26   GLUCOSE 147*  --  102* 149* 102* 109*  BUN 18  --  16 28* 33* 36*  CREATININE 1.06*  --  1.17* 1.43* 1.64* 1.46*  CALCIUM 8.7*  --  8.5* 8.8* 8.4* 8.4*  MG 2.1  --  1.9  --   --   --    Liver Function Tests: Recent Labs  Lab 08/31/20 0320  AST 18  ALT 16  ALKPHOS 69  BILITOT 0.9  PROT 4.9*  ALBUMIN 3.0*   No results for input(s): LIPASE, AMYLASE in the last 168 hours. No results for input(s): AMMONIA in the last 168 hours. CBC: Recent Labs  Lab 08/30/20 1657 08/30/20 1758 09/02/20 0809 09/03/20 0059  WBC 5.6  --  6.1 5.6  NEUTROABS 4.3  --   --   --   HGB 12.9 12.6 12.8 11.8*  HCT 38.2 37.0 39.7 35.0*  MCV 92.7  --  93.6 92.6  PLT 176  --  175 171   Cardiac Enzymes: No results for input(s): CKTOTAL, CKMB, CKMBINDEX, TROPONINI in the last 168 hours. BNP: Invalid input(s): POCBNP CBG: No results for input(s): GLUCAP in the last 168 hours. D-Dimer No results for input(s): DDIMER in the last 72 hours. Hgb A1c No results for input(s): HGBA1C in the last 72 hours. Lipid Profile No results for input(s): CHOL, HDL, LDLCALC, TRIG, CHOLHDL, LDLDIRECT in the last 72 hours. Thyroid function studies No results for input(s): TSH, T4TOTAL, T3FREE, THYROIDAB in the last 72 hours.  Invalid input(s): FREET3 Anemia work up No results for input(s): VITAMINB12, FOLATE, FERRITIN, TIBC, IRON, RETICCTPCT in  the last 72 hours. Urinalysis     Component Value Date/Time   COLORURINE STRAW (A) 08/31/2020 0233   APPEARANCEUR CLEAR 08/31/2020 0233   LABSPEC 1.010 08/31/2020 0233   PHURINE 6.0 08/31/2020 0233   GLUCOSEU NEGATIVE 08/31/2020 0233   GLUCOSEU NEGATIVE 12/01/2019 1028   HGBUR NEGATIVE 08/31/2020 0233   BILIRUBINUR NEGATIVE 08/31/2020 0233   BILIRUBINUR n 08/17/2012 1455   KETONESUR NEGATIVE 08/31/2020 0233   PROTEINUR NEGATIVE 08/31/2020 0233   UROBILINOGEN 0.2 12/01/2019 1028   NITRITE NEGATIVE 08/31/2020 0233   LEUKOCYTESUR NEGATIVE 08/31/2020 0233   Sepsis Labs Invalid input(s): PROCALCITONIN,  WBC,  LACTICIDVEN Microbiology Recent Results (from the past 240 hour(s))  Blood culture (routine x 2)     Status: None (Preliminary result)   Collection Time: 08/30/20  8:40 PM   Specimen: BLOOD RIGHT ARM  Result Value Ref Range Status   Specimen Description BLOOD RIGHT ARM  Final   Special Requests   Final    BOTTLES DRAWN AEROBIC AND ANAEROBIC Blood Culture adequate volume   Culture   Final    NO GROWTH 4 DAYS Performed at [facility], [place] [address] [place], [place] [address]    Report Status PENDING  Incomplete  Blood culture (routine x 2)     Status: None (Preliminary result)   Collection Time: 08/30/20  8:45 PM   Specimen: BLOOD RIGHT HAND  Result Value Ref Range Status   Specimen Description BLOOD RIGHT HAND  Final   Special Requests   Final    BOTTLES DRAWN AEROBIC AND ANAEROBIC Blood Culture adequate volume   Culture   Final    NO GROWTH 4 DAYS Performed at [facility], [place] [address], [place], [place] [address]    Report Status PENDING  Incomplete  SARS CORONAVIRUS 2 (TAT 6-24 HRS) Nasopharyngeal Nasopharyngeal Swab     Status: None   Collection Time: 08/30/20 10:15 PM   Specimen: Nasopharyngeal Swab  Result Value Ref Range Status   SARS Coronavirus 2 NEGATIVE NEGATIVE Final    Comment: (NOTE) SARS-CoV-2 target nucleic acids are NOT DETECTED.  The SARS-CoV-2 RNA is generally  detectable in upper and lower respiratory specimens during the acute phase of infection. Negative results do not preclude SARS-CoV-2 infection, do not rule out co-infections with other pathogens, and should not be used as the sole basis for treatment or other patient management decisions. Negative results must be combined with clinical observations, patient history, and epidemiological information. The expected result is Negative.  Fact Sheet for Patients: SugarRoll.be  Fact Sheet for Healthcare Providers: https://www.woods-mathews.com/  This test is not yet approved or cleared by the [place] FDA and  has been authorized for detection and/or diagnosis of SARS-CoV-2 by FDA under an Emergency Use Authorization (EUA). This EUA will remain  in effect (meaning this test can be used) for the duration of the COVID-19 declaration under Se ction 564(b)(1) of the Act, 21 U.S.C. section 360bbb-3(b)(1), unless the authorization is terminated or revoked sooner.  Performed at [facility], [place] [address], [place], [place] [address]   MRSA PCR Screening     Status: None   Collection Time: 09/01/20  9:19 AM   Specimen: Nasopharyngeal  Result Value Ref Range Status   MRSA by PCR NEGATIVE NEGATIVE Final    Comment:        The GeneXpert MRSA Assay (FDA approved for NASAL specimens only), is one component of a comprehensive MRSA colonization surveillance program. It is  not intended to diagnose MRSA infection nor to guide or monitor treatment for MRSA infections. Performed at [facility], [place] [address] [place], [place] [address]      Time coordinating discharge: 40 minutes  SIGNED:   Elmarie Shiley, MD  [facility]

## 2020-09-04 NOTE — TOC Progression Note (Addendum)
Transition of Care [facility]) - Progression Note    Patient Details  Name: [name] MRN: [id number] Date of Birth: 01-Feb-1925  Transition of Care [facility]) CM/SW [place], [place] Phone Number: 09/04/2020, 9:39 AM  Clinical Narrative:     [facility] has beds but not accepting [employer]. [facility] and [facility] have no bed availability. [facility] only accepts pt's from within their ILF/ALF community.   CSW called pt's daughter [name]. [name] confirmed plan for pt to return to her home and daughter will live with her temporarily to assist. She requests a bedside commode. HH to be arranged for PT/OT/aide. Daughter states pt does not need [facility]. No preference in [place]. CSW contacted Georgina Snell with Alvis [facility] with [facility] referral. Bedside commode arranged with adapt; to be delivered to pt room.   1001: Georgina Snell confirmed [facility] referral for [facility]  Expected Discharge Plan: Skilled Nursing Facility Barriers to Discharge: Continued Medical Work up  Expected Discharge Plan and Services Expected Discharge Plan: [place] arrangements for the past 2 months: Single Family Home Expected Discharge Date: 09/04/20                                     Social Determinants of Health (SDOH) Interventions    Readmission Risk Interventions No flowsheet data found.

## 2020-09-04 NOTE — Progress Notes (Signed)
Cardiology follow up has been arranged.

## 2020-09-04 NOTE — Plan of Care (Signed)

## 2020-09-04 NOTE — Care Management Important Message (Signed)
Important Message  Patient Details  Name: [name] MRN: [id number] Date of Birth: 1924-08-11   Medicare Important Message Given:  Yes     Abby Stines [address] Circle 09/04/2020, 12:45 PM

## 2020-09-04 NOTE — Progress Notes (Signed)
   09/04/20 1047  AVS Discharge Documentation  AVS Discharge Instructions Including Medications Provided to patient/caregiver  Name of Person Receiving AVS Discharge Instructions Including Medications [name] and daughter [name]  Name of Clinician That Reviewed AVS Discharge Instructions Including Medications Anson Fret RN [facility]

## 2020-09-04 NOTE — Care Management Important Message (Signed)
Important Message  Patient Details  Name: LIDYA MCCALISTER MRN: [id number] Date of Birth: 29-Jul-1924   Medicare Important Message Given:  Yes  Patient left prior to IM delivery.  Will mail IM to home address.    Laylonie Marzec 09/04/2020, 12:46 PM

## 2020-09-05 ENCOUNTER — Other Ambulatory Visit: Payer: Self-pay | Admitting: Adult Health

## 2020-09-05 ENCOUNTER — Telehealth: Payer: Self-pay | Admitting: Adult Health

## 2020-09-05 DIAGNOSIS — H353221 Exudative age-related macular degeneration, left eye, with active choroidal neovascularization: Secondary | ICD-10-CM | POA: Diagnosis not present

## 2020-09-05 DIAGNOSIS — H353113 Nonexudative age-related macular degeneration, right eye, advanced atrophic without subfoveal involvement: Secondary | ICD-10-CM | POA: Diagnosis not present

## 2020-09-05 DIAGNOSIS — H35033 Hypertensive retinopathy, bilateral: Secondary | ICD-10-CM | POA: Diagnosis not present

## 2020-09-05 DIAGNOSIS — H26492 Other secondary cataract, left eye: Secondary | ICD-10-CM | POA: Diagnosis not present

## 2020-09-05 MED ORDER — ALPRAZOLAM 0.5 MG PO TABS: 0.2500 mg | ORAL_TABLET | Freq: Two times a day (BID) | ORAL | 1 refills | Status: DC | PRN

## 2020-09-05 NOTE — Progress Notes (Signed)
  Chronic Care Management   Note  09/05/2020 Name: [name] MRN: [id number] DOB: [birth year]  [name] is a 87 y.o. year old female who is a primary care patient of [name], NP. I reached out to [name] by phone today in response to a referral sent by Ms. Brienna M Lyons's PCP, [name], NP.   Ms. Jian was given information about Chronic Care Management services today including:  1. CCM service includes personalized support from designated clinical staff supervised by her physician, including individualized plan of care and coordination with other care providers 2. 24/7 contact phone numbers for assistance for urgent and routine care needs. 3. Service will only be billed when office clinical staff spend 20 minutes or more in a month to coordinate care. 4. Only one practitioner may furnish and bill the service in a calendar month. 5. The patient may stop CCM services at any time (effective at the end of the month) by phone call to the office staff.   Patient agreed to services and verbal consent obtained.   Follow up plan:   Carley Perdue [employer] Scheduler

## 2020-09-07 ENCOUNTER — Other Ambulatory Visit: Payer: Self-pay

## 2020-09-07 ENCOUNTER — Other Ambulatory Visit: Payer: Medicare PPO | Admitting: *Deleted

## 2020-09-07 DIAGNOSIS — I6523 Occlusion and stenosis of bilateral carotid arteries: Secondary | ICD-10-CM | POA: Diagnosis not present

## 2020-09-07 LAB — BASIC METABOLIC PANEL
BUN/Creatinine Ratio: 24 (ref 12–28)
BUN: 30 mg/dL (ref 10–36)
CO2: 25 mmol/L (ref 20–29)
Calcium: 8.9 mg/dL (ref 8.7–10.3)
Chloride: 100 mmol/L (ref 96–106)
Creatinine, Ser: 1.27 mg/dL — ABNORMAL HIGH (ref 0.57–1.00)
Glucose: 157 mg/dL — ABNORMAL HIGH (ref 65–99)
Potassium: 3.9 mmol/L (ref 3.5–5.2)
Sodium: 138 mmol/L (ref 134–144)
eGFR: 39 mL/min/{1.73_m2} — ABNORMAL LOW (ref >59)

## 2020-09-12 ENCOUNTER — Ambulatory Visit: Payer: Medicare PPO | Admitting: Pulmonary Disease

## 2020-09-12 ENCOUNTER — Other Ambulatory Visit: Payer: Self-pay

## 2020-09-12 ENCOUNTER — Encounter: Payer: Self-pay | Admitting: Pulmonary Disease

## 2020-09-12 VITALS — BP 148/70 | HR 67 | Temp 98.0°F | Ht 66.0 in | Wt 126.8 lb

## 2020-09-12 DIAGNOSIS — J449 Chronic obstructive pulmonary disease, unspecified: Secondary | ICD-10-CM | POA: Diagnosis not present

## 2020-09-12 NOTE — Patient Instructions (Signed)
I am glad you are doing well with regard to your breathing Continue the Trelegy inhaler and prednisone at current dose Continue the nebulizer as needed  Follow-up in 6 months.

## 2020-09-12 NOTE — Progress Notes (Signed)
[name]    [id number]    August 24, 1924  Primary Care Physician:Nafziger, Tommi Rumps, NP  Referring Physician: [name], NP [name],  [place] [address]  Chief complaint:  Follow up for  COPD Gold D Vocal cord paralysis  HPI: [name] is a 87 year old with complaints of dyspnea on exertion. The problem started in May 2017 after a carotid artery end arterectomy. This procedure was complicated by left vocal cord paralysis, hoarseness.  Also has COPD Gold D, mMRC 3 with multiple exacerbations.  Multiple exacerbations and hospitalizations over the past few years for COPD. She was hospitalized in the emergency room in October 2017 for COPD exacerbation. She was discharged on a prednisone taper and amoxicillin. Records from this hospitalization reviewed below in data section  Hospitalized in April 2018 with severe anemia, bleeding AVMs.  Underwent EGD with cauterization, clipping of bleeding lesion. Hospitalized for 2 days in July 2019 for mild COPD exacerbation. In 2019 we attempted to try her on Daliresp to get her off the prednisone but she did not tolerate it with increasing dyspnea Seen in the ED on February 2020 with wheezing, dyspnea improved with 1 dose of Solu-Medrol and nebs  Interim History: Remains on Trelegy inhaler, nebs as needed and chronic prednisone. She was hospitalized in February 2022 for acute respiratory failure due to CHF exacerbation, no evidence of COPD exacerbation.  She improved with diuresis and was seen by cardiology  Overall she feels back to baseline   Outpatient Encounter Medications as of 09/12/2020  Medication Sig  . albuterol (PROAIR HFA) 108 (90 Base) MCG/ACT inhaler Inhale 2 puffs into the lungs every 6 (six) hours as needed for wheezing or shortness of breath.  . ALPRAZolam (XANAX) 0.5 MG tablet Take 0.5-1 tablets (0.25-0.5 mg total) by mouth 2 (two) times daily as needed for anxiety.  [name] Kitchen aspirin 81 MG chewable  tablet Chew 1 tablet (81 mg total) by mouth daily.  [name] Kitchen atorvastatin (LIPITOR) 80 MG tablet TAKE 1 TABLET BY MOUTH EVERY DAY AT 6PM (Patient taking differently: Take 80 mg by mouth daily.)  . furosemide (LASIX) 20 MG tablet Take 1 tablet (20 mg total) by mouth daily.  . hydrALAZINE (APRESOLINE) 25 MG tablet TAKE 3 TABLETS BY MOUTH 3 TIMES A DAY (Patient taking differently: Take 75 mg by mouth 3 (three) times daily.)  . ipratropium-albuterol (DUONEB) 0.5-2.5 (3) MG/3ML SOLN Take 3 mLs by nebulization every 6 (six) hours as needed (copd).  . isosorbide mononitrate (IMDUR) 30 MG 24 hr tablet Take 1 tablet (30 mg total) by mouth daily.  [name] Kitchen latanoprost (XALATAN) 0.005 % ophthalmic solution Place 1 drop into both eyes at bedtime.   [name] Kitchen levothyroxine (SYNTHROID) 75 MCG tablet Take 1 tablet (75 mcg total) by mouth daily before breakfast.  . pantoprazole (PROTONIX) 40 MG tablet Take 1 tablet (40 mg total) by mouth daily.  . potassium chloride SA (KLOR-CON) 20 MEQ tablet Take 1 tablet (20 mEq total) by mouth daily.  . predniSONE (DELTASONE) 5 MG tablet Take 5-10 mg by mouth daily with breakfast.  . Spacer/Aero-Holding Chambers (AEROCHAMBER MV) inhaler Use as instructed (Patient taking differently: 1 each by Other route as directed.)  . TRELEGY ELLIPTA 100-62.5-25 MCG/INH AEPB INHALE 1 PUFF BY MOUTH EVERY DAY (Patient taking differently: Inhale 1 puff into the lungs daily.)   No facility-administered encounter medications on file as of 09/12/2020.   Physical Exam: Blood pressure (!) 148/70, pulse 67, temperature 98  F (36.7 C), temperature source Temporal, height 5\' 6"  (1.676 m), weight 126 lb 12.8 oz (57.5 kg), SpO2 96 %. Gen:      No acute distress HEENT:  EOMI, sclera anicteric Neck:     No masses; no thyromegaly Lungs:    Clear to auscultation bilaterally; normal respiratory effort CV:         Regular rate and rhythm; no murmurs Abd:      + bowel sounds; soft, non-tender; no palpable masses, no  distension Ext:    No edema; adequate peripheral perfusion Skin:      Warm and dry; no rash Neuro: alert and oriented x 3 Psych: normal mood and affect  Data Reviewed: Imnaging CT scan chest 11/26/12- Right lingular opacity, scattered sub cm pulmonary nodules, images reviewed.  CT 04/05/16- Stable pulmonary nodules Severe COPD. Chest x-ray 01/12/2018- hyperinflation, no active pulmonary disease.  Stable 7 mm opacity in the left midlung. Chest x-ray 08/17/2018- hyperinflation, chronic nodular density over left lung. CTA 08/30/2020-negative for PE, emphysema, small left effusion with atelectasis, cardiomegaly. I reviewed the images personally.  PFTs 04/02/16 FVC 2.54 (107%), FEV1 1.43 (82%), F/F 56, TLC 107%, DLCO 42% Mod obstructive lung disease with DLCO impairment.  No bronchodilator response  Assessment: Moderate COPD. Although she has moderate obstruction on PFTs her COPD is likely severe based on lung imaging and DLCO impairment.  Currently on Trelegy inhaler, nebs as needed  She has not tolerated coming off prednisone and frequency of exacerbations have decreased with a daily pednisone.  Has side effects of skin bruising with higher doses of prednisone Has not tolerated Daliresp.  Looks like she will need to be on prednisone indefinitely which she takes between 2.5 to 10 mg/day based on symptoms   Goals of care Initiated discussion about goals of care given frailty, age and recurrent exacerbations She tells me that she wants short-term life support but does not want to be kept on the ventilator for long. I encouraged her to consider DNR status.  She wants to think about it Continue discussions going forward.  Health maintenance Up-to-date with flu and Pneumovax and Covid vaccine   Plan/Recommendations: - Continue Trelegy, duo nebs as needed - Continue prednisone - Use incentive spirometer and flutter valve 3 times/ day.  Marshell Garfinkel MD [facility] 09/12/2020, 10:49 AM  CC: [name], NP

## 2020-09-16 DIAGNOSIS — W228XXA Striking against or struck by other objects, initial encounter: Secondary | ICD-10-CM | POA: Diagnosis not present

## 2020-09-16 DIAGNOSIS — S81812A Laceration without foreign body, left lower leg, initial encounter: Secondary | ICD-10-CM | POA: Diagnosis not present

## 2020-09-20 ENCOUNTER — Encounter (HOSPITAL_COMMUNITY): Payer: Self-pay | Admitting: Emergency Medicine

## 2020-09-20 ENCOUNTER — Emergency Department (HOSPITAL_COMMUNITY): Payer: Medicare PPO

## 2020-09-20 ENCOUNTER — Emergency Department (HOSPITAL_COMMUNITY)
Admission: EM | Admit: 2020-09-20 | Discharge: 2020-09-20 | Disposition: A | Discharge status: Home / Self Care | Payer: Medicare PPO | Attending: Emergency Medicine | Admitting: Emergency Medicine

## 2020-09-20 ENCOUNTER — Encounter: Payer: Self-pay | Admitting: Adult Health

## 2020-09-20 DIAGNOSIS — Z79899 Other long term (current) drug therapy: Secondary | ICD-10-CM | POA: Insufficient documentation

## 2020-09-20 DIAGNOSIS — N183 Chronic kidney disease, stage 3 unspecified: Secondary | ICD-10-CM | POA: Diagnosis not present

## 2020-09-20 DIAGNOSIS — R0602 Shortness of breath: Secondary | ICD-10-CM | POA: Insufficient documentation

## 2020-09-20 DIAGNOSIS — I5023 Acute on chronic systolic (congestive) heart failure: Secondary | ICD-10-CM | POA: Diagnosis not present

## 2020-09-20 DIAGNOSIS — I130 Hypertensive heart and chronic kidney disease with heart failure and stage 1 through stage 4 chronic kidney disease, or unspecified chronic kidney disease: Secondary | ICD-10-CM | POA: Diagnosis not present

## 2020-09-20 DIAGNOSIS — Z20822 Contact with and (suspected) exposure to covid-19: Secondary | ICD-10-CM | POA: Diagnosis not present

## 2020-09-20 DIAGNOSIS — I5021 Acute systolic (congestive) heart failure: Secondary | ICD-10-CM | POA: Insufficient documentation

## 2020-09-20 DIAGNOSIS — I313 Pericardial effusion (noninflammatory): Secondary | ICD-10-CM | POA: Insufficient documentation

## 2020-09-20 DIAGNOSIS — J441 Chronic obstructive pulmonary disease with (acute) exacerbation: Secondary | ICD-10-CM | POA: Insufficient documentation

## 2020-09-20 DIAGNOSIS — R062 Wheezing: Secondary | ICD-10-CM | POA: Diagnosis not present

## 2020-09-20 DIAGNOSIS — Z87891 Personal history of nicotine dependence: Secondary | ICD-10-CM | POA: Insufficient documentation

## 2020-09-20 DIAGNOSIS — E039 Hypothyroidism, unspecified: Secondary | ICD-10-CM | POA: Diagnosis not present

## 2020-09-20 DIAGNOSIS — I499 Cardiac arrhythmia, unspecified: Secondary | ICD-10-CM | POA: Diagnosis not present

## 2020-09-20 DIAGNOSIS — I110 Hypertensive heart disease with heart failure: Secondary | ICD-10-CM | POA: Diagnosis not present

## 2020-09-20 DIAGNOSIS — J9811 Atelectasis: Secondary | ICD-10-CM | POA: Diagnosis not present

## 2020-09-20 DIAGNOSIS — R0689 Other abnormalities of breathing: Secondary | ICD-10-CM | POA: Diagnosis not present

## 2020-09-20 DIAGNOSIS — I700 Atherosclerosis of aorta: Secondary | ICD-10-CM | POA: Diagnosis not present

## 2020-09-20 DIAGNOSIS — R000 Tachycardia, unspecified: Secondary | ICD-10-CM | POA: Diagnosis not present

## 2020-09-20 DIAGNOSIS — J90 Pleural effusion, not elsewhere classified: Secondary | ICD-10-CM | POA: Diagnosis not present

## 2020-09-20 DIAGNOSIS — Z7982 Long term (current) use of aspirin: Secondary | ICD-10-CM | POA: Diagnosis not present

## 2020-09-20 DIAGNOSIS — I491 Atrial premature depolarization: Secondary | ICD-10-CM | POA: Diagnosis not present

## 2020-09-20 DIAGNOSIS — I517 Cardiomegaly: Secondary | ICD-10-CM | POA: Diagnosis not present

## 2020-09-20 LAB — CBC
HCT: 41.1 % (ref 36.0–46.0)
Hemoglobin: 13.2 g/dL (ref 12.0–15.0)
MCH: 30.7 pg (ref 26.0–34.0)
MCHC: 32.1 g/dL (ref 30.0–36.0)
MCV: 95.6 fL (ref 80.0–100.0)
Platelets: 181 10*3/uL (ref 150–400)
RBC: 4.3 MIL/uL (ref 3.87–5.11)
RDW: 13.6 % (ref 11.5–15.5)
WBC: 6.2 10*3/uL (ref 4.0–10.5)
nRBC: 0 % (ref 0.0–0.2)

## 2020-09-20 LAB — RESP PANEL BY RT-PCR (FLU A&B, COVID) ARPGX2
Influenza A by PCR: NEGATIVE
Influenza B by PCR: NEGATIVE
SARS Coronavirus 2 by RT PCR: NEGATIVE

## 2020-09-20 LAB — BASIC METABOLIC PANEL
Anion gap: 9 (ref 5–15)
BUN: 21 mg/dL (ref 8–23)
CO2: 26 mmol/L (ref 22–32)
Calcium: 9 mg/dL (ref 8.9–10.3)
Chloride: 101 mmol/L (ref 98–111)
Creatinine, Ser: 1.4 mg/dL — ABNORMAL HIGH (ref 0.44–1.00)
GFR, Estimated: 35 mL/min — ABNORMAL LOW (ref >60)
Glucose, Bld: 163 mg/dL — ABNORMAL HIGH (ref 70–99)
Potassium: 4 mmol/L (ref 3.5–5.1)
Sodium: 136 mmol/L (ref 135–145)

## 2020-09-20 LAB — TROPONIN I (HIGH SENSITIVITY)
Troponin I (High Sensitivity): 19 ng/L — ABNORMAL HIGH (ref <18)
Troponin I (High Sensitivity): 19 ng/L — ABNORMAL HIGH (ref <18)

## 2020-09-20 LAB — BRAIN NATRIURETIC PEPTIDE: B Natriuretic Peptide: 372.4 pg/mL — ABNORMAL HIGH (ref 0.0–100.0)

## 2020-09-20 MED ORDER — FUROSEMIDE 10 MG/ML IJ SOLN
40.0000 mg | Freq: Once | INTRAMUSCULAR | Status: AC
  Administered 2020-09-20: 40 mg via INTRAVENOUS
  Filled 2020-09-20: qty 4

## 2020-09-20 MED ORDER — IOHEXOL 350 MG/ML SOLN
60.0000 mL | Freq: Once | INTRAVENOUS | Status: AC | PRN
  Administered 2020-09-20: 60 mL via INTRAVENOUS

## 2020-09-20 MED ORDER — METHYLPREDNISOLONE SODIUM SUCC 125 MG IJ SOLR
125.0000 mg | Freq: Once | INTRAMUSCULAR | Status: AC
  Administered 2020-09-20: 125 mg via INTRAVENOUS
  Filled 2020-09-20: qty 2

## 2020-09-20 MED ORDER — ALBUTEROL SULFATE HFA 108 (90 BASE) MCG/ACT IN AERS
2.0000 | INHALATION_SPRAY | Freq: Once | RESPIRATORY_TRACT | Status: AC
  Administered 2020-09-20: 2 via RESPIRATORY_TRACT
  Filled 2020-09-20: qty 6.7

## 2020-09-20 NOTE — ED Triage Notes (Signed)
Patient BIB [facility] for sudden onset of shortness of breath that started at approximately 1350 today. Similar episode a few weeks ago when patient was newly diagnosed with CHF. Room air SpO2 96%, patient placed on 4L [place] for comfort by EMS. Patient alert, oriented, and in no apparent distress at this time.

## 2020-09-20 NOTE — ED Notes (Signed)
Patient 94-95% on RA while ambulating

## 2020-09-20 NOTE — ED Provider Notes (Signed)
[name] Provider Note   CSN: [id number] Arrival date & time: 09/20/20  1433     History Chief Complaint  Patient presents with  . Shortness of Breath    [name] is a 87 y.o. female history of COPD, systolic and diastolic CHF, here presenting with shortness of breath.  Patient states that she has acute onset of shortness of breath this afternoon.  She states that she has a hard time catching her breath.  She states that she is compliant with her 20 mg of Lasix every day.  She also use a albuterol nebulizer with no relief.  She states that she is still on prednisone taper from her last admission for COPD and CHF.  She was admitted about a month ago.   The history is provided by the patient.       Past Medical History:  Diagnosis Date  . Anxiety   . Aortic arch atherosclerosis ([place]) 06/24/2014  . Atherosclerotic ulcer of aorta ([place]) 06/24/2014  . Carotid artery occlusion   . Chronic kidney disease   . COPD (chronic obstructive pulmonary disease) ([place])   . [place] DISEASE, LUMBAR 12/16/2008  . DIVERTICULOSIS, COLON 09/30/2008  . DYSPNEA 07/13/2008  . Graves disease   . History of embolic stroke 0/02/6760   Left brain  . HYPERLIPIDEMIA 03/06/2007  . HYPERTENSION 03/06/2007  . HYPOTHYROIDISM 10/13/2007  . OSTEOARTHRITIS 03/06/2007  . Personal history of colonic polyps 09/30/2008  . Stroke ([place])    06/2014           . TOBACCO USE, QUIT 04/12/2009  . WEAKNESS 11/09/2007    Patient Active Problem List   Diagnosis Date Noted  . Acute systolic CHF (congestive heart failure) ([place]) 09/01/2020  . Acute respiratory failure ([place])   . Mild aortic stenosis 08/30/2020  . Elevated troponin 08/30/2020  . Pleural effusion on left 08/30/2020  . Pain in joint of left knee 04/10/2020  . D-dimer, elevated 01/14/2020  . Elevated brain natriuretic peptide (BNP) level 01/14/2020  . Advice given about COVID-19 virus infection 07/21/2019  . Current chronic  use of systemic steroids 07/13/2019  . Medication management 04/02/2018  . COPD exacerbation ([place]) 03/31/2018  . Hypokalemia 03/30/2018  . COPD with acute exacerbation ([place]) 03/02/2018  . SOB (shortness of breath) 01/12/2018  . CKD (chronic kidney disease), stage III 01/12/2018  . New onset left bundle branch block (LBBB) 01/12/2018  . COPD GOLD I D 01/12/2018  . Neurogenic claudication due to lumbar spinal stenosis 09/13/2017  . Symptomatic anemia   . AVM (arteriovenous malformation) of duodenum, acquired with hemorrhage   . Acute GI bleeding 10/09/2016  . COPD with emphysema ([place]) 05/10/2016  . Recurrent laryngeal neuropathy 02/01/2016  . Nausea with vomiting 12/11/2015  . Left carotid stenosis 11/21/2015  . Asymptomatic carotid artery stenosis 11/13/2015  . Impaired glucose tolerance 10/27/2015  . Solitary pulmonary nodule 10/27/2015  . Thyroid nodule 10/27/2015  . Syncope 10/04/2015  . Bradycardia with less than 60 beats per minute 10/04/2015  . History of embolic stroke 95/03/3266  . Paresthesia of both feet 07/06/2014  . Aortic arch atherosclerosis ([place]) 06/24/2014  . Atherosclerotic ulcer of aorta ([place]) 06/24/2014  . Stroke ([place]) 06/22/2014  . Bilateral carotid artery disease ([place]) 05/18/2014  . TOBACCO USE, QUIT 04/12/2009  . [place] DISEASE, LUMBAR 12/16/2008  . DIVERTICULOSIS, COLON 09/30/2008  . Personal history of colonic polyps 09/30/2008  . DYSPNEA 07/13/2008  . Weakness 11/09/2007  . Hypothyroidism 10/13/2007  .  Mixed hyperlipidemia 03/06/2007  . Essential hypertension 03/06/2007  . Osteoarthritis 03/06/2007    Past Surgical History:  Procedure Laterality Date  . CARDIAC CATHETERIZATION    . CATARACT EXTRACTION Bilateral   . CHOLECYSTECTOMY    . ENDARTERECTOMY Left 11/13/2015   Procedure: LEFT CAROTID ARTERY ENDARTERECTOMY;  Surgeon: Conrad Grainfield, MD;  Location: [place];  Service: Vascular;  Laterality: Left;  . ESOPHAGOGASTRODUODENOSCOPY (EGD) WITH PROPOFOL  N/A 10/11/2016   Procedure: ESOPHAGOGASTRODUODENOSCOPY (EGD) WITH PROPOFOL;  Surgeon: Mauri Pole, MD;  Location: [facility] ENDOSCOPY;  Service: Endoscopy;  Laterality: N/A;  . KNEE SURGERY    . PATCH ANGIOPLASTY Left 11/13/2015   Procedure: WITH 1CM X 6CM  XENOSURE BIOLOGIC PATCH ANGIOPLASTY;  Surgeon: Conrad Williston, MD;  Location: [place];  Service: Vascular;  Laterality: Left;  . TEE WITHOUT CARDIOVERSION N/A 06/24/2014   Procedure: TRANSESOPHAGEAL ECHOCARDIOGRAM (TEE);  Surgeon: Sanda Klein, MD;  Location: [facility] ENDOSCOPY;  Service: Cardiovascular;  Laterality: N/A;     OB History   No obstetric history on file.     Family History  Problem Relation Age of Onset  . Stomach cancer Maternal Grandmother   . Colon cancer Neg Hx     Social History   Tobacco Use  . Smoking status: Former Smoker    Packs/day: 1.50    Years: 29.00    Pack years: 43.50    Types: Cigarettes    Start date: 07/08/1949    Quit date: 07/08/1978    Years since quitting: 42.2  . Smokeless tobacco: Never Used  Vaping Use  . Vaping Use: Never used  Substance Use Topics  . Alcohol use: Yes    Alcohol/week: 1.0 standard drink    Types: 1 Glasses of wine per week    Comment: "red wine"- some nights  . Drug use: No    Home Medications Prior to Admission medications   Medication Sig Start Date End Date Taking? Authorizing Provider  albuterol (PROAIR HFA) 108 (90 Base) MCG/ACT inhaler Inhale 2 puffs into the lungs every 6 (six) hours as needed for wheezing or shortness of breath. 11/13/17   Volanda Napoleon, PA-C  ALPRAZolam Duanne Moron) 0.5 MG tablet Take 0.5-1 tablets (0.25-0.5 mg total) by mouth 2 (two) times daily as needed for anxiety. 09/05/20   [name], NP  aspirin 81 MG chewable tablet Chew 1 tablet (81 mg total) by mouth daily. 09/04/20   Regalado, Belkys A, MD  atorvastatin (LIPITOR) 80 MG tablet TAKE 1 TABLET BY MOUTH EVERY DAY AT 6PM Patient taking differently: Take 80 mg by mouth daily. 04/20/20    Nafziger, Tommi Rumps, NP  furosemide (LASIX) 20 MG tablet Take 1 tablet (20 mg total) by mouth daily. 09/05/20   Regalado, Belkys A, MD  hydrALAZINE (APRESOLINE) 25 MG tablet TAKE 3 TABLETS BY MOUTH 3 TIMES A DAY Patient taking differently: Take 75 mg by mouth 3 (three) times daily. 08/30/19   Burtis Junes, NP  ipratropium-albuterol (DUONEB) 0.5-2.5 (3) MG/3ML SOLN Take 3 mLs by nebulization every 6 (six) hours as needed (copd). 01/16/20   Danford, Suann Larry, MD  isosorbide mononitrate (IMDUR) 30 MG 24 hr tablet Take 1 tablet (30 mg total) by mouth daily. 09/04/20   Regalado, Belkys A, MD  latanoprost (XALATAN) 0.005 % ophthalmic solution Place 1 drop into both eyes at bedtime.  04/02/19   [provider]  levothyroxine (SYNTHROID) 75 MCG tablet Take 1 tablet (75 mcg total) by mouth daily before breakfast. 07/04/20   Nafziger,  Tommi Rumps, NP  pantoprazole (PROTONIX) 40 MG tablet Take 1 tablet (40 mg total) by mouth daily. 09/04/20   Regalado, Belkys A, MD  potassium chloride SA (KLOR-CON) 20 MEQ tablet Take 1 tablet (20 mEq total) by mouth daily. 09/04/20   Regalado, Belkys A, MD  predniSONE (DELTASONE) 5 MG tablet Take 5-10 mg by mouth daily with breakfast.    [provider]  Spacer/Aero-Holding Chambers (AEROCHAMBER MV) inhaler Use as instructed Patient taking differently: 1 each by Other route as directed. 11/26/17   Magdalen Spatz, NP  TRELEGY ELLIPTA 100-62.5-25 MCG/INH AEPB INHALE 1 PUFF BY MOUTH EVERY DAY Patient taking differently: Inhale 1 puff into the lungs daily. 08/09/20   Marshell Garfinkel, MD    Allergies    Catapres [clonidine hcl], Lisinopril, and Labetalol hcl  Review of Systems   Review of Systems  Respiratory: Positive for shortness of breath.   All other systems reviewed and are negative.   Physical Exam Updated Vital Signs BP (!) 180/86   Pulse 85   Temp 98 F (36.7 C)   Resp (!) 28   SpO2 96%   Physical Exam Vitals and nursing note reviewed.   Constitutional:      Comments: Chronically ill and tachypneic  HENT:     Head: Normocephalic.     Mouth/Throat:     Mouth: Mucous membranes are moist.  Eyes:     Extraocular Movements: Extraocular movements intact.     Pupils: Pupils are equal, round, and reactive to light.  Cardiovascular:     Rate and Rhythm: Normal rate and regular rhythm.  Pulmonary:     Comments: Tachypneic and mild diffuse wheezing and diminished in bilateral bases Abdominal:     General: Bowel sounds are normal.     Palpations: Abdomen is soft.  Musculoskeletal:        General: Normal range of motion.     Cervical back: Normal range of motion and neck supple.  Skin:    General: Skin is warm.     Capillary Refill: Capillary refill takes less than 2 seconds.  Neurological:     General: No focal deficit present.     Mental Status: She is oriented to person, place, and time.  Psychiatric:        Mood and Affect: Mood normal.        Behavior: Behavior normal.     ED Results / Procedures / Treatments   Labs (all labs ordered are listed, but only abnormal results are displayed) Labs Reviewed  BASIC METABOLIC PANEL - Abnormal; Notable for the following components:      Result Value   Glucose, Bld 163 (*)    Creatinine, Ser 1.40 (*)    GFR, Estimated 35 (*)    All other components within normal limits  BRAIN NATRIURETIC PEPTIDE - Abnormal; Notable for the following components:   B Natriuretic Peptide 372.4 (*)    All other components within normal limits  TROPONIN I (HIGH SENSITIVITY) - Abnormal; Notable for the following components:   Troponin I (High Sensitivity) 19 (*)    All other components within normal limits  TROPONIN I (HIGH SENSITIVITY) - Abnormal; Notable for the following components:   Troponin I (High Sensitivity) 19 (*)    All other components within normal limits  RESP PANEL BY RT-PCR (FLU A&B, COVID) [id number]  CBC    EKG EKG Interpretation  Date/Time:  Wednesday September 20 2020  14:55:32 EDT Ventricular Rate:  91 PR Interval:  158  QRS Duration: 110 QT Interval:  386 QTC Calculation: 474 R Axis:     Text Interpretation: Sinus rhythm with Premature atrial complexes Left ventricular hypertrophy with repolarization abnormality ( Cornell product ) Cannot rule out Septal infarct , age undetermined Abnormal ECG No significant change since last tracing Confirmed by Wandra Arthurs [phone]) on 09/20/2020 3:08:38 PM   Radiology DG Chest 2 View  Result Date: 09/20/2020 CLINICAL DATA:  Shortness of breath. EXAM: CHEST - 2 VIEW COMPARISON:  August 30, 2020 FINDINGS: The lungs are hyperinflated. Chronic appearing increased interstitial lung markings are seen. Very mild atelectasis is seen within the left lung base. There is a small left pleural effusion. No pneumothorax is identified. The heart size and mediastinal contours are within normal limits. There is moderate severity calcification of the aortic arch. Degenerative changes seen within the bilateral shoulders and thoracic spine. IMPRESSION: Chronic appearing increased interstitial lung markings with mild left basilar atelectasis and small left pleural effusion. Electronically Signed   By: Virgina Norfolk M.D.   On: 09/20/2020 15:37   CT Angio Chest PE W and/or Wo Contrast  Result Date: 09/20/2020 CLINICAL DATA:  87 year old with shortness of breath. EXAM: CT ANGIOGRAPHY CHEST WITH CONTRAST TECHNIQUE: Multidetector CT imaging of the chest was performed using the standard protocol during bolus administration of intravenous contrast. Multiplanar CT image reconstructions and MIPs were obtained to evaluate the vascular anatomy. CONTRAST:  95mL OMNIPAQUE IOHEXOL 350 MG/ML SOLN COMPARISON:  Radiograph earlier today. Chest CTA 3 weeks ago 08/30/2020 FINDINGS: Cardiovascular: There are no filling defects within the pulmonary arteries to suggest pulmonary embolus. Advanced aortic atherosclerosis with calcified plaque and irregular mural  thrombus. This was better assessed on prior exam given phase of contrast. Borderline cardiomegaly. There are coronary artery calcifications. Trace pericardial fluid is similar to prior exam. Mediastinum/Nodes: No enlarged mediastinal or hilar lymph nodes. No esophageal wall thickening. Lungs/Pleura: Improved left pleural effusion from prior exam, small in size. Mild adjacent compressive atelectasis. Linear atelectasis in the right lower lobe and lingula. Mild background emphysema. Bandlike scarring in the left greater than right upper lobe, unchanged. No acute airspace disease. No findings of pulmonary edema. Trachea and central bronchi are patent. Upper Abdomen: Cholecystectomy.  No acute upper abdominal findings. Musculoskeletal: No acute or suspicious osseous abnormalities. Advanced bilateral glenohumeral osteoarthritis. Review of the MIP images confirms the above findings. IMPRESSION: 1. No pulmonary embolus. 2. Small left pleural effusion which is improved from prior CT. 3. Emphysema with multifocal scarring.  No acute airspace disease. 4. Similar cardiomegaly and trace pericardial effusion. Aortic Atherosclerosis (ICD10-I70.0) and Emphysema (ICD10-J43.9). Electronically Signed   By: Keith Rake M.D.   On: 09/20/2020 18:05    Procedures Procedures   Medications Ordered in ED Medications  methylPREDNISolone sodium succinate (SOLU-MEDROL) 125 mg/2 mL injection 125 mg (125 mg Intravenous Given 09/20/20 1610)  albuterol (VENTOLIN HFA) 108 (90 Base) MCG/ACT inhaler 2 puff (2 puffs Inhalation Given 09/20/20 1609)  furosemide (LASIX) injection 40 mg (40 mg Intravenous Given 09/20/20 1654)  iohexol (OMNIPAQUE) 350 MG/ML injection 60 mL (60 mLs Intravenous Contrast Given 09/20/20 1736)    ED Course  I have reviewed the triage vital signs and the nursing notes.  Pertinent labs & imaging results that were available during my care of the patient were reviewed by me and considered in my medical decision  making (see chart for details).    MDM Rules/Calculators/A&P  [name] is a 87 y.o. female here presenting with shortness of breath.  Patient has worsening shortness of breath that is acute in onset today.  Denies any chest pain or back pain.  Recently admitted for COPD and CHF and has some wheezing and crackles.  Consider COPD and CHF exacerbation.  Also consider PE or dissection as well.  She did have a PET scan 3 weeks ago but since the symptoms are worsening we will repeat a CT PE.  6:44 PM Patient is feeling better now.  She is not on oxygen right now.  She has minimal wheezing.  Her BNP is 370 which is similar to the last admission.  Her troponin is flat.  Patient's Covid test is negative.  CT showed left pleural effusion is improving from previous.  Patient still has a trace pericardial effusion.  Patient given Lasix and felt better now.  She was able to ambulate and oxygen was maintained at 94%.  We will increase her Lasix to 40 mg daily for 3 days.  I wanted to increase her prednisone as well but she states that she does not want to increase prednisone.  She has been on prednisone for several weeks now and she wants to maintain the current dose.  Final Clinical Impression(s) / ED Diagnoses Final diagnoses:  None    Rx / DC Orders ED Discharge Orders    None       Drenda Freeze, MD 09/20/20 1850

## 2020-09-20 NOTE — Discharge Instructions (Signed)
Increase Lasix to 40 mg daily for 3 days then back to 20 mg.  Continue monitor your daily weight  Talk to your cardiologist and you need to follow-up next week.  Continue albuterol as needed for shortness of breath.  Return to ER if you have worse shortness of breath, trouble breathing, fever, cough.

## 2020-09-21 ENCOUNTER — Telehealth: Payer: Self-pay | Admitting: Cardiovascular Disease

## 2020-09-21 NOTE — Telephone Encounter (Signed)
Called patient's daughter back to let her know we have an opening on Monday. She agreed to the appointment and patient started taking the increased dose of Lasix today.

## 2020-09-21 NOTE — Telephone Encounter (Signed)
Left message for patient to call back  

## 2020-09-21 NOTE — Telephone Encounter (Signed)
Pt c/o medication issue:  1. Name of Medication: furosemide (LASIX) 20 MG tablet  2. How are you currently taking this medication (dosage and times per day)? As prescribed   3. Are you having a reaction (difficulty breathing--STAT)? No   4. What is your medication issue? [name] is calling stating [name] was released from the hospital yesterday and they are wanting her to increase her Lasix to 40 MG's for the next 3 days. She states they advised her to call our office and have her rescheduled for next week to see how the medication is effecting her. At this time there is no sooner appointments available at the [facility] office before 10/17/20. [name] has not yet increased her Lasix. Please advise.

## 2020-09-24 NOTE — Progress Notes (Signed)
CARDIOLOGY OFFICE NOTE  Date:  09/25/2020    [name] Date of Birth: 1924-11-09 Medical Record #[id number]  PCP:  [name], NP  Cardiologist:  Berton Bon chief complaint on file.   History of Present Illness: [name] is a 87 y.o. female who presents today for a follow up visit.   She has ahistory of labile HTN (intolerant to labetalol and has not tolerated Clonidine), HLD, aortic stenosis, hypothyroidism, CVA (left parietal in Dec. 2015), post CEA  COPD and pulmonary nodule.Low risk Myoview in 2017. Anxiety has played significant role in her BP management.    She was to have L CEA on May of 2017- looks like this complicated by a vocal cord paralysis and "with possible TE from extensive aortic arch atherosclerosis".She wasreluctant to start anticoagulation. She elected to take ASA and Plavix instead.But then was admitted withprofound anemia in the setting of aspirin and Plavix - transfused - had EGD and noted large AVM that was clipped. She is to stay just on aspirin despite being at risk for embolism from the aortic arch atherosclerosis.  Has had several COPD exacerbations - now on chronic steroids. Daily alcohol use continues. More edema on Norvasc . Last seen in February - felt to be stable from our standpoint.  Hospitalized 2/23-2/28/22 with COPD exacerbation and CHF. Echo with moderate AS and EF 40-45% d/c with imdur/hydralazine and lasix 20 mg daily Baseline CR about 1.4   Seen in ED 3/16 with dyspnea BNP 372 CT negative PE emphysema R/O ECG NSR Rx lasix improved D/c with Lasix 40 mg for 3 days then ? Back to 20 mg daily Left pleural effusion small improved R/O with negative troponin ER doctor wanted to increase steroids but patient deferred Feels back to baseline  Has Sciatica and fell with excoriation over left shin   Past Medical History:  Diagnosis Date  . Anxiety   . Aortic arch atherosclerosis ([place]) 06/24/2014  . Atherosclerotic  ulcer of aorta ([place]) 06/24/2014  . Carotid artery occlusion   . Chronic kidney disease   . COPD (chronic obstructive pulmonary disease) ([place])   . [place] DISEASE, LUMBAR 12/16/2008  . DIVERTICULOSIS, COLON 09/30/2008  . DYSPNEA 07/13/2008  . Graves disease   . History of embolic stroke 08/27/2540   Left brain  . HYPERLIPIDEMIA 03/06/2007  . HYPERTENSION 03/06/2007  . HYPOTHYROIDISM 10/13/2007  . OSTEOARTHRITIS 03/06/2007  . Personal history of colonic polyps 09/30/2008  . Stroke ([place])    06/2014           . TOBACCO USE, QUIT 04/12/2009  . WEAKNESS 11/09/2007    Past Surgical History:  Procedure Laterality Date  . CARDIAC CATHETERIZATION    . CATARACT EXTRACTION Bilateral   . CHOLECYSTECTOMY    . ENDARTERECTOMY Left 11/13/2015   Procedure: LEFT CAROTID ARTERY ENDARTERECTOMY;  Surgeon: Conrad Temple, MD;  Location: [facility];  Service: Vascular;  Laterality: Left;  . ESOPHAGOGASTRODUODENOSCOPY (EGD) WITH PROPOFOL N/A 10/11/2016   Procedure: ESOPHAGOGASTRODUODENOSCOPY (EGD) WITH PROPOFOL;  Surgeon: Mauri Pole, MD;  Location: [facility];  Service: Endoscopy;  Laterality: N/A;  . KNEE SURGERY    . PATCH ANGIOPLASTY Left 11/13/2015   Procedure: WITH 1CM X 6CM  XENOSURE BIOLOGIC PATCH ANGIOPLASTY;  Surgeon: Conrad Loveland Park, MD;  Location: [facility];  Service: Vascular;  Laterality: Left;  . TEE WITHOUT CARDIOVERSION N/A 06/24/2014   Procedure: TRANSESOPHAGEAL ECHOCARDIOGRAM (TEE);  Surgeon: Sanda Klein, MD;  Location: [facility];  Service:  Cardiovascular;  Laterality: N/A;     Medications: Current Meds  Medication Sig  . albuterol (PROAIR HFA) 108 (90 Base) MCG/ACT inhaler Inhale 2 puffs into the lungs every 6 (six) hours as needed for wheezing or shortness of breath.  . ALPRAZolam (XANAX) 0.5 MG tablet Take 0.5-1 tablets (0.25-0.5 mg total) by mouth 2 (two) times daily as needed for anxiety.  [name] Kitchen aspirin 81 MG chewable tablet Chew 1 tablet (81 mg total) by mouth daily.  [name] Kitchen atorvastatin (LIPITOR) 80 MG  tablet TAKE 1 TABLET BY MOUTH EVERY DAY AT 6PM (Patient taking differently: Take 80 mg by mouth daily.)  . furosemide (LASIX) 20 MG tablet Take 1 tablet (20 mg total) by mouth daily.  . hydrALAZINE (APRESOLINE) 25 MG tablet TAKE 3 TABLETS BY MOUTH 3 TIMES A DAY (Patient taking differently: Take 75 mg by mouth 3 (three) times daily.)  . ipratropium-albuterol (DUONEB) 0.5-2.5 (3) MG/3ML SOLN Take 3 mLs by nebulization every 6 (six) hours as needed (copd).  . isosorbide mononitrate (IMDUR) 30 MG 24 hr tablet Take 1 tablet (30 mg total) by mouth daily.  [name] Kitchen latanoprost (XALATAN) 0.005 % ophthalmic solution Place 1 drop into both eyes at bedtime.   [name] Kitchen levothyroxine (SYNTHROID) 75 MCG tablet Take 1 tablet (75 mcg total) by mouth daily before breakfast.  . pantoprazole (PROTONIX) 40 MG tablet Take 1 tablet (40 mg total) by mouth daily.  . potassium chloride SA (KLOR-CON) 20 MEQ tablet Take 1 tablet (20 mEq total) by mouth daily.  . predniSONE (DELTASONE) 5 MG tablet Take 5-10 mg by mouth daily with breakfast.  . Spacer/Aero-Holding Chambers (AEROCHAMBER MV) inhaler Use as instructed (Patient taking differently: 1 each by Other route as directed.)  . TRELEGY ELLIPTA 100-62.5-25 MCG/INH AEPB INHALE 1 PUFF BY MOUTH EVERY DAY (Patient taking differently: Inhale 1 puff into the lungs daily.)     Allergies: Allergies  Allergen Reactions  . Catapres [Clonidine Hcl] Other (See Comments)    Made pt feel horrible, shaky, weak and nausea  . Clonidine Other (See Comments)    Made pt feel horrible, shaky, weak and nausea  . Lisinopril Anaphylaxis    Tongue swelling  . Labetalol Other (See Comments)    Caused bradycardia and syncope  . Labetalol Hcl Other (See Comments)    Caused bradycardia and syncope    Social History: The patient  reports that she quit smoking about 42 years ago. Her smoking use included cigarettes. She started smoking about 71 years ago. She has a 43.50 pack-year smoking history. She has  never used smokeless tobacco. She reports current alcohol use of about 1.0 standard drink of alcohol per week. She reports that she does not use drugs.   Family History: The patient's family history includes Stomach cancer in her maternal grandmother.   Review of Systems: Please see the history of present illness.   All other systems are reviewed and negative.   Physical Exam: VS:  BP (!) 118/50   Pulse 76   Ht 5\' 6"  (1.676 m)   Wt 55.8 kg   SpO2 96%   BMI 19.85 kg/m  .  BMI Body mass index is 19.85 kg/m.  Wt Readings from Last 3 Encounters:  09/25/20 55.8 kg  09/12/20 57.5 kg  09/04/20 53.3 kg    Affect appropriate Healthy:  appears stated age HEENT: normal Neck supple with no adenopathy JVP normal  Post right CEA  no thyromegaly Lungs clear with no wheezing and good diaphragmatic motion Heart:  S1/S2 AS  murmur, no rub, gallop or click PMI normal Abdomen: benighn, BS positve, no tenderness, no AAA no bruit.  No HSM or HJR Distal pulses intact with no bruits No edema Neuro non-focal Healing ecchymosis over left shin  No muscular weakness    LABORATORY DATA:  EKG:  EKG is not ordered today.    Lab Results  Component Value Date   WBC 6.2 09/20/2020   HGB 13.2 09/20/2020   HCT 41.1 09/20/2020   PLT 181 09/20/2020   GLUCOSE 163 (H) 09/20/2020   CHOL 218 (H) 03/24/2019   TRIG 73 03/24/2019   HDL 111 03/24/2019   LDLDIRECT 141.7 05/01/2011   LDLCALC 94 03/24/2019   ALT 16 08/31/2020   AST 18 08/31/2020   NA 136 09/20/2020   K 4.0 09/20/2020   CL 101 09/20/2020   CREATININE 1.40 (H) 09/20/2020   BUN 21 09/20/2020   CO2 26 09/20/2020   TSH 3.587 08/31/2020   INR 1.1 08/31/2020   HGBA1C 6.1 (H) 01/15/2020     BNP (last 3 results) Recent Labs    03/27/20 2043 08/30/20 1657 09/20/20 1542  BNP 205.3* 348.6* 372.4*    ProBNP (last 3 results) No results for input(s): PROBNP in the last 8760 hours.   Other Studies Reviewed Today:  EchoStudy  Conclusions 08/31/20  EF 40-45% Moderate AS Mean gradient 19.5 mmHg  DVI .27    Nm Myocar Multi W/spect W/wall Motion / Ef 10/05/2015   There was no ST segment deviation noted during stress.  Defect 1: There is a medium defect of mild severity present in the mid anteroseptal, mid inferoseptal and apical septal location.  This is a low risk study.  The study is normal.  The left ventricular ejection fraction is normal (55-65%).  Nuclear stress EF: 57%. Low risk stress nuclear study with LBBB-related perfusion artifact, otherwise normal perfusion and normal left ventricular regional and global systolic function.   CTA Neck (10/09/15) Advanced atherosclerotic plaque of the thoracic aorta and distal aortic arch.  50% diameter stenosis proximal right internal carotid artery  Irregular plaque of the distal left common carotid artery with plaque ulceration. Very irregular plaque involving the proximal left internal carotid artery. Critical 85% stenosis of the proximal left internal carotid artery  Mild atherosclerotic disease in the right vertebral artery without significant vertebral artery stenosis.  Heterogeneous thyroid bilaterally with 11 mm right lower pole nodule. Recommend thyroid ultrasound.    Assessment/Plan:  1. Labile HTN - Well controlled.  Continue current medications and low sodium Dash type diet.    2. Carotid disease - prior CEA from 2017 - duplex 05/2020 plaque no stenosis   3. HLD - remains on high dose statin - labs by PCP  4. Severe COPD - on chronic steroid therapy.   5. Prior CVA  6. Daily alcohol use - this is not going to stop. She is drinking one glass a day.   7. Aortic stenosis - echo 08/31/20 moderate AS mean gradient 20 mmhg in setting EF 40-45% given age observe     Current medicines are reviewed with the patient today.  The patient does not have concerns regarding medicines other than what has been noted above.  The following  changes have been made:  See above.  Labs/ tests ordered today include:   No orders of the defined types were placed in this encounter.    Disposition:   FU with [place] in 6 months.    Patient is agreeable  to this plan and will call if any problems develop in the interim.   Signed: Jenkins Rouge, MD  09/25/2020 10:07 AM  [facility] [address] [place], [place]  [address] Phone: [phone] Fax: [phone]

## 2020-09-25 ENCOUNTER — Emergency Department (HOSPITAL_COMMUNITY)
Admission: EM | Admit: 2020-09-25 | Discharge: 2020-09-25 | Disposition: A | Discharge status: Home / Self Care | Payer: Medicare PPO | Attending: Emergency Medicine | Admitting: Emergency Medicine

## 2020-09-25 ENCOUNTER — Encounter: Payer: Self-pay | Admitting: Adult Health

## 2020-09-25 ENCOUNTER — Ambulatory Visit: Payer: Medicare PPO | Admitting: Cardiovascular Disease

## 2020-09-25 ENCOUNTER — Encounter: Payer: Self-pay | Admitting: Cardiovascular Disease

## 2020-09-25 ENCOUNTER — Other Ambulatory Visit: Payer: Self-pay

## 2020-09-25 VITALS — BP 118/50 | HR 76 | Ht 66.0 in | Wt 123.0 lb

## 2020-09-25 DIAGNOSIS — R079 Chest pain, unspecified: Secondary | ICD-10-CM | POA: Diagnosis not present

## 2020-09-25 DIAGNOSIS — I350 Nonrheumatic aortic (valve) stenosis: Secondary | ICD-10-CM

## 2020-09-25 DIAGNOSIS — R42 Dizziness and giddiness: Secondary | ICD-10-CM | POA: Diagnosis not present

## 2020-09-25 DIAGNOSIS — R600 Localized edema: Secondary | ICD-10-CM | POA: Diagnosis not present

## 2020-09-25 DIAGNOSIS — R55 Syncope and collapse: Secondary | ICD-10-CM | POA: Insufficient documentation

## 2020-09-25 DIAGNOSIS — Z5321 Procedure and treatment not carried out due to patient leaving prior to being seen by health care provider: Secondary | ICD-10-CM | POA: Diagnosis not present

## 2020-09-25 DIAGNOSIS — I5040 Unspecified combined systolic (congestive) and diastolic (congestive) heart failure: Secondary | ICD-10-CM

## 2020-09-25 DIAGNOSIS — R0789 Other chest pain: Secondary | ICD-10-CM | POA: Diagnosis not present

## 2020-09-25 DIAGNOSIS — R0602 Shortness of breath: Secondary | ICD-10-CM | POA: Diagnosis not present

## 2020-09-25 DIAGNOSIS — I447 Left bundle-branch block, unspecified: Secondary | ICD-10-CM | POA: Diagnosis not present

## 2020-09-25 DIAGNOSIS — I499 Cardiac arrhythmia, unspecified: Secondary | ICD-10-CM | POA: Diagnosis not present

## 2020-09-25 LAB — BASIC METABOLIC PANEL
Anion gap: 9 (ref 5–15)
BUN: 21 mg/dL (ref 8–23)
CO2: 26 mmol/L (ref 22–32)
Calcium: 8.8 mg/dL — ABNORMAL LOW (ref 8.9–10.3)
Chloride: 101 mmol/L (ref 98–111)
Creatinine, Ser: 1.27 mg/dL — ABNORMAL HIGH (ref 0.44–1.00)
GFR, Estimated: 39 mL/min — ABNORMAL LOW (ref >60)
Glucose, Bld: 108 mg/dL — ABNORMAL HIGH (ref 70–99)
Potassium: 3.8 mmol/L (ref 3.5–5.1)
Sodium: 136 mmol/L (ref 135–145)

## 2020-09-25 LAB — CBC
HCT: 38.6 % (ref 36.0–46.0)
Hemoglobin: 12.7 g/dL (ref 12.0–15.0)
MCH: 30.8 pg (ref 26.0–34.0)
MCHC: 32.9 g/dL (ref 30.0–36.0)
MCV: 93.7 fL (ref 80.0–100.0)
Platelets: 171 10*3/uL (ref 150–400)
RBC: 4.12 MIL/uL (ref 3.87–5.11)
RDW: 13.8 % (ref 11.5–15.5)
WBC: 6.9 10*3/uL (ref 4.0–10.5)
nRBC: 0 % (ref 0.0–0.2)

## 2020-09-25 MED ORDER — HYDRALAZINE HCL 50 MG PO TABS: 50.0000 mg | ORAL_TABLET | Freq: Three times a day (TID) | ORAL | 3 refills | Status: DC

## 2020-09-25 NOTE — ED Notes (Signed)
Patient very adamant about not waiting for the time. Patient states she can check her mychart tomorrow because she wants to be home in bed. IV taken out. LWBS

## 2020-09-25 NOTE — ED Notes (Signed)
Patients daughter states her mom cant wait for twelve hours before being seen by a doctor. Explained wait time versus acuity and the process of getting patients back to seen the doctor.

## 2020-09-25 NOTE — ED Triage Notes (Signed)
Pt arrived by EMS for SOB and near syncope at home. Pt went to UC for blood pressure check and they sent her here for work up of syncope. Pt reports feeling lightheaded at home, sat down on the couch and passed out for nearly 2 hours. Pt appears sob which she reports she has been seen recently for same and sob feels slightly worse. Pt A&Ox4 currently. No neuro deficits noted on exam

## 2020-09-25 NOTE — Patient Instructions (Addendum)
Medication Instructions:  Your physician has recommended you make the following change in your medication:  1-STOP Aspirin 2-Increase Hydralazine 50 mg by mouth three times a day.  *If you need a refill on your cardiac medications before your next appointment, please call your pharmacy*  Lab Work: If you have labs (blood work) drawn today and your tests are completely normal, you will receive your results only by: [name] Kitchen MyChart Message (if you have MyChart) OR . A paper copy in the mail If you have any lab test that is abnormal or we need to change your treatment, we will call you to review the results.  Testing/Procedures: None ordered today.  Follow-Up: At [facility], you and your health needs are our priority.  As part of our continuing mission to provide you with exceptional heart care, we have created designated Provider Care Teams.  These Care Teams include your primary Cardiologist (physician) and Advanced Practice Providers (APPs -  Physician Assistants and Nurse Practitioners) who all work together to provide you with the care you need, when you need it.  We recommend signing up for the patient portal called "MyChart".  Sign up information is provided on this After Visit Summary.  MyChart is used to connect with patients for Virtual Visits (Telemedicine).  Patients are able to view lab/test results, encounter notes, upcoming appointments, etc.  Non-urgent messages can be sent to your provider as well.   To learn more about what you can do with MyChart, go to [email or web address].    Your next appointment:   3 month(s)  The format for your next appointment:   In Person  Provider:   You may see Jenkins Rouge, MD or one of the following Advanced Practice Providers on your designated Care Team:    Kathyrn Drown, NP

## 2020-09-26 NOTE — Telephone Encounter (Signed)
Called patient's daughter to discuss message. Patient had passed out about 2 1/2 hours yesterday after not feeling well, and not eating anything. Patient only had an orange juice and a mini muffin that morning before her office visit with Dr. Johnsie Cancel. Patient went to urgent care where her BP was elevated and was instructed to go to the ED. Patient's BP was elevated in the ED as well. Patient did not stay in the ED, due to not wanting to wait to see the doctor. Patient went home and was instructed to take an extra lasix if needed.Patient went home and ate dinner and felt better. Patient has been taking increase dose of lasix for 5 days, instead of the 3 days they were told. Informed patient's daughter they need to go back to taking lasix 20 mg by mouth daily as prescribed, but if they feel like patient is needing the extra dose to call our office.  This morning patient states she feels fine and she is doing okay per her daughter. Informed patient's daughter that patient needs to eat a healthy diet and make sure she stays hydrated. Will forward to Dr. Johnsie Cancel to further advisement.

## 2020-10-05 ENCOUNTER — Ambulatory Visit: Payer: Medicare PPO | Admitting: Cardiology

## 2020-10-10 DIAGNOSIS — M5416 Radiculopathy, lumbar region: Secondary | ICD-10-CM | POA: Diagnosis not present

## 2020-10-12 ENCOUNTER — Telehealth: Payer: Self-pay | Admitting: Cardiovascular Disease

## 2020-10-12 ENCOUNTER — Other Ambulatory Visit: Payer: Self-pay

## 2020-10-12 MED ORDER — AMLODIPINE BESYLATE 5 MG PO TABS: 5.0000 mg | ORAL_TABLET | Freq: Every day | ORAL | 0 refills | Status: DC

## 2020-10-12 NOTE — Telephone Encounter (Signed)
Pt c/o BP issue: STAT if pt c/o blurred vision, one-sided weakness or slurred speech  1. What are your last 5 BP readings? 182/91 today, has been ranging 172/87   2. Are you having any other symptoms (ex. Dizziness, headache, blurred vision, passed out)? Feels wobbly and woozy  3. What is your BP issue? Patient's daughter states the patient is very upset, because her BP has been high. She states she is not currently with the patient, but leaves close. She states the patient said she felt wobbly and woozy, but did not mention any other symptoms. She states if she does not answer to call her sister [employer] at [phone].

## 2020-10-13 ENCOUNTER — Emergency Department (HOSPITAL_COMMUNITY)
Admission: EM | Admit: 2020-10-13 | Discharge: 2020-10-13 | Disposition: A | Discharge status: Home / Self Care | Payer: Medicare PPO | Attending: Emergency Medicine | Admitting: Emergency Medicine

## 2020-10-13 ENCOUNTER — Encounter (HOSPITAL_COMMUNITY): Payer: Self-pay | Admitting: Emergency Medicine

## 2020-10-13 ENCOUNTER — Ambulatory Visit: Payer: Medicare PPO | Admitting: Adult Health

## 2020-10-13 ENCOUNTER — Other Ambulatory Visit: Payer: Self-pay

## 2020-10-13 DIAGNOSIS — I130 Hypertensive heart and chronic kidney disease with heart failure and stage 1 through stage 4 chronic kidney disease, or unspecified chronic kidney disease: Secondary | ICD-10-CM | POA: Insufficient documentation

## 2020-10-13 DIAGNOSIS — I10 Essential (primary) hypertension: Secondary | ICD-10-CM | POA: Diagnosis not present

## 2020-10-13 DIAGNOSIS — Z87891 Personal history of nicotine dependence: Secondary | ICD-10-CM | POA: Diagnosis not present

## 2020-10-13 DIAGNOSIS — Z7951 Long term (current) use of inhaled steroids: Secondary | ICD-10-CM | POA: Insufficient documentation

## 2020-10-13 DIAGNOSIS — R5381 Other malaise: Secondary | ICD-10-CM | POA: Diagnosis not present

## 2020-10-13 DIAGNOSIS — I5021 Acute systolic (congestive) heart failure: Secondary | ICD-10-CM | POA: Insufficient documentation

## 2020-10-13 DIAGNOSIS — E039 Hypothyroidism, unspecified: Secondary | ICD-10-CM | POA: Insufficient documentation

## 2020-10-13 DIAGNOSIS — N183 Chronic kidney disease, stage 3 unspecified: Secondary | ICD-10-CM | POA: Insufficient documentation

## 2020-10-13 DIAGNOSIS — J449 Chronic obstructive pulmonary disease, unspecified: Secondary | ICD-10-CM | POA: Insufficient documentation

## 2020-10-13 DIAGNOSIS — Z79899 Other long term (current) drug therapy: Secondary | ICD-10-CM | POA: Diagnosis not present

## 2020-10-13 DIAGNOSIS — Z0289 Encounter for other administrative examinations: Secondary | ICD-10-CM

## 2020-10-13 LAB — CBC WITH DIFFERENTIAL/PLATELET
Abs Immature Granulocytes: 0.02 10*3/uL (ref 0.00–0.07)
Basophils Absolute: 0 10*3/uL (ref 0.0–0.1)
Basophils Relative: 1 %
Eosinophils Absolute: 0.1 10*3/uL (ref 0.0–0.5)
Eosinophils Relative: 2 %
HCT: 35.6 % — ABNORMAL LOW (ref 36.0–46.0)
Hemoglobin: 11.5 g/dL — ABNORMAL LOW (ref 12.0–15.0)
Immature Granulocytes: 0 %
Lymphocytes Relative: 13 %
Lymphs Abs: 0.8 10*3/uL (ref 0.7–4.0)
MCH: 31.7 pg (ref 26.0–34.0)
MCHC: 32.3 g/dL (ref 30.0–36.0)
MCV: 98.1 fL (ref 80.0–100.0)
Monocytes Absolute: 0.8 10*3/uL (ref 0.1–1.0)
Monocytes Relative: 14 %
Neutro Abs: 4.3 10*3/uL (ref 1.7–7.7)
Neutrophils Relative %: 70 %
Platelets: 148 10*3/uL — ABNORMAL LOW (ref 150–400)
RBC: 3.63 MIL/uL — ABNORMAL LOW (ref 3.87–5.11)
RDW: 14.5 % (ref 11.5–15.5)
WBC: 6.1 10*3/uL (ref 4.0–10.5)
nRBC: 0 % (ref 0.0–0.2)

## 2020-10-13 LAB — BASIC METABOLIC PANEL
Anion gap: 5 (ref 5–15)
BUN: 24 mg/dL — ABNORMAL HIGH (ref 8–23)
CO2: 29 mmol/L (ref 22–32)
Calcium: 8.8 mg/dL — ABNORMAL LOW (ref 8.9–10.3)
Chloride: 105 mmol/L (ref 98–111)
Creatinine, Ser: 1.19 mg/dL — ABNORMAL HIGH (ref 0.44–1.00)
GFR, Estimated: 43 mL/min — ABNORMAL LOW (ref >60)
Glucose, Bld: 125 mg/dL — ABNORMAL HIGH (ref 70–99)
Potassium: 3.7 mmol/L (ref 3.5–5.1)
Sodium: 139 mmol/L (ref 135–145)

## 2020-10-13 NOTE — ED Triage Notes (Signed)
Patient BIB [facility] from home. Reports high blood pressure readings today and yesterday, 308/MVHQIONG, and 295/MWUXLKGM. Per patient has chronic HTN, has been working with PCP to get BP under control. BP 160/78 with EMS. NAD. A&Ox4.

## 2020-10-13 NOTE — ED Notes (Signed)
Patient's daughter is at the bedside.

## 2020-10-13 NOTE — Discharge Instructions (Addendum)
Please read and follow all provided instructions.  Your diagnoses today include:  1. Primary hypertension     Your blood pressure was high today (BP): BP (!) 161/57 (BP Location: Left Arm)   Pulse 69   Temp 97.9 F (36.6 C) (Oral)   Resp 18   SpO2 98%   Tests performed today include:  Vital signs. See below for your results today.   Medications prescribed:   None  Home care instructions:  Follow any educational materials contained in this packet.  Follow-up instructions: Please follow-up with Dr. Oval Linsey in the hypertension clinic as planned.  If your blood pressure is greater than 180 (top number) or 110 (bottom number) at dinner time you can consider taking 5 mg of amlodipine.   Return instructions:   Please return to the Emergency Department if you experience worsening symptoms.   Return with severe chest pain, abdominal pain, or shortness of breath.   Return with severe headache, focal weakness, numbness, difficulty with speech or vision.  Return with loss of vision or sudden vision change.  Please return if you have any other emergent concerns.  Additional Information:  Your vital signs today were: BP (!) 161/57 (BP Location: Left Arm)   Pulse 69   Temp 97.9 F (36.6 C) (Oral)   Resp 18   SpO2 98%  If your blood pressure (BP) was elevated above 135/85 this visit, please have this repeated by your doctor within one month. --------------

## 2020-10-13 NOTE — ED Provider Notes (Signed)
[facility] EMERGENCY DEPARTMENT Provider Note   CSN: [id number] Arrival date & time: 10/13/20  1002     History Chief Complaint  Patient presents with  . Hypertension    [name] is a 87 y.o. female.  Patient with history of chronic kidney disease, high blood pressure on amlodipine 5 mg daily, hydralazine 50 mg 3 times daily. She presents today for evaluation of high blood pressure.  Patient states that her blood pressure was elevated last night and again this morning, up as high as 175 systolic.  She states that her cardiologist referred her to her PCP and her PCP told her to come emergently to the hospital.  She states that she feels weak and woozy.  She denies headache, stroke symptoms, vision loss or change, chest pain, or shortness of breath other than her typical COPD symptoms.  No abdominal pains today.  She took her blood pressure medication this morning with improvement of her blood pressure on arrival to the ED.  She states that she was anxious about her blood pressure being elevated, prompting emergency department visit as well.  The onset of this condition was acute. The course is constant. Aggravating factors: none. Alleviating factors: none.          Past Medical History:  Diagnosis Date  . Anxiety   . Aortic arch atherosclerosis ([place]) 06/24/2014  . Atherosclerotic ulcer of aorta ([place]) 06/24/2014  . Carotid artery occlusion   . Chronic kidney disease   . COPD (chronic obstructive pulmonary disease) ([place])   . [place] DISEASE, LUMBAR 12/16/2008  . DIVERTICULOSIS, COLON 09/30/2008  . DYSPNEA 07/13/2008  . Graves disease   . History of embolic stroke 07/09/5850   Left brain  . HYPERLIPIDEMIA 03/06/2007  . HYPERTENSION 03/06/2007  . HYPOTHYROIDISM 10/13/2007  . OSTEOARTHRITIS 03/06/2007  . Personal history of colonic polyps 09/30/2008  . Stroke ([place])    06/2014           . TOBACCO USE, QUIT 04/12/2009  . WEAKNESS 11/09/2007    Patient Active Problem  List   Diagnosis Date Noted  . Acute systolic CHF (congestive heart failure) ([place]) 09/01/2020  . Acute respiratory failure ([place])   . Mild aortic stenosis 08/30/2020  . Elevated troponin 08/30/2020  . Pleural effusion on left 08/30/2020  . Pain in joint of left knee 04/10/2020  . D-dimer, elevated 01/14/2020  . Elevated brain natriuretic peptide (BNP) level 01/14/2020  . Advice given about COVID-19 virus infection 07/21/2019  . Current chronic use of systemic steroids 07/13/2019  . Medication management 04/02/2018  . COPD exacerbation ([place]) 03/31/2018  . Hypokalemia 03/30/2018  . COPD with acute exacerbation ([place]) 03/02/2018  . SOB (shortness of breath) 01/12/2018  . CKD (chronic kidney disease), stage III 01/12/2018  . New onset left bundle branch block (LBBB) 01/12/2018  . COPD GOLD I D 01/12/2018  . Neurogenic claudication due to lumbar spinal stenosis 09/13/2017  . Symptomatic anemia   . AVM (arteriovenous malformation) of duodenum, acquired with hemorrhage   . Acute GI bleeding 10/09/2016  . COPD with emphysema ([place]) 05/10/2016  . Recurrent laryngeal neuropathy 02/01/2016  . Nausea with vomiting 12/11/2015  . Left carotid stenosis 11/21/2015  . Asymptomatic carotid artery stenosis 11/13/2015  . Impaired glucose tolerance 10/27/2015  . Solitary pulmonary nodule 10/27/2015  . Thyroid nodule 10/27/2015  . Syncope 10/04/2015  . Bradycardia with less than 60 beats per minute 10/04/2015  . History of embolic stroke 77/82/4235  . Paresthesia of  both feet 07/06/2014  . Aortic arch atherosclerosis ([place]) 06/24/2014  . Atherosclerotic ulcer of aorta ([place]) 06/24/2014  . Stroke ([place]) 06/22/2014  . Bilateral carotid artery disease ([place]) 05/18/2014  . TOBACCO USE, QUIT 04/12/2009  . [place] DISEASE, LUMBAR 12/16/2008  . DIVERTICULOSIS, COLON 09/30/2008  . Personal history of colonic polyps 09/30/2008  . DYSPNEA 07/13/2008  . Weakness 11/09/2007  . Hypothyroidism 10/13/2007  . Mixed  hyperlipidemia 03/06/2007  . Essential hypertension 03/06/2007  . Osteoarthritis 03/06/2007    Past Surgical History:  Procedure Laterality Date  . CARDIAC CATHETERIZATION    . CATARACT EXTRACTION Bilateral   . CHOLECYSTECTOMY    . ENDARTERECTOMY Left 11/13/2015   Procedure: LEFT CAROTID ARTERY ENDARTERECTOMY;  Surgeon: Conrad Dania Beach, MD;  Location: [facility];  Service: Vascular;  Laterality: Left;  . ESOPHAGOGASTRODUODENOSCOPY (EGD) WITH PROPOFOL N/A 10/11/2016   Procedure: ESOPHAGOGASTRODUODENOSCOPY (EGD) WITH PROPOFOL;  Surgeon: Mauri Pole, MD;  Location: [facility] END[facility];  Service: Endoscopy;  Laterality: N/A;  . KNEE SURGERY    . PATCH ANGIOPLASTY Left 11/13/2015   Procedure: WITH 1CM X 6CM  XENOSURE BIOLOGIC PATCH ANGIOPLASTY;  Surgeon: Conrad Alcester, MD;  Location: [facility];  Service: Vascular;  Laterality: Left;  . TEE WITHOUT CARDIOVERSION N/A 06/24/2014   Procedure: TRANSESOPHAGEAL ECHOCARDIOGRAM (TEE);  Surgeon: Sanda Klein, MD;  Location: [facility] ENDOSCOPY;  Service: Cardiovascular;  Laterality: N/A;     OB History   No obstetric history on file.     Family History  Problem Relation Age of Onset  . Stomach cancer Maternal Grandmother   . Colon cancer Neg Hx     Social History   Tobacco Use  . Smoking status: Former Smoker    Packs/day: 1.50    Years: 29.00    Pack years: 43.50    Types: Cigarettes    Start date: 07/08/1949    Quit date: 07/08/1978    Years since quitting: 42.2  . Smokeless tobacco: Never Used  Vaping Use  . Vaping Use: Never used  Substance Use Topics  . Alcohol use: Yes    Alcohol/week: 1.0 standard drink    Types: 1 Glasses of wine per week    Comment: "red wine"- some nights  . Drug use: No    Home Medications Prior to Admission medications   Medication Sig Start Date End Date Taking? Authorizing Provider  albuterol (PROAIR HFA) 108 (90 Base) MCG/ACT inhaler Inhale 2 puffs into the lungs every 6 (six) hours as needed for wheezing or shortness of  breath. 11/13/17   Volanda Napoleon, PA-C  ALPRAZolam Duanne Moron) 0.5 MG tablet Take 0.5-1 tablets (0.25-0.5 mg total) by mouth 2 (two) times daily as needed for anxiety. 09/05/20   Nafziger, Tommi Rumps, NP  amLODipine (NORVASC) 5 MG tablet Take 1 tablet (5 mg total) by mouth daily. 10/12/20   Josue Hector, MD  atorvastatin (LIPITOR) 80 MG tablet TAKE 1 TABLET BY MOUTH EVERY DAY AT 6PM Patient taking differently: Take 80 mg by mouth daily. 04/20/20   Nafziger, Tommi Rumps, NP  furosemide (LASIX) 20 MG tablet Take 1 tablet (20 mg total) by mouth daily. 09/05/20   Regalado, Belkys A, MD  hydrALAZINE (APRESOLINE) 50 MG tablet Take 1 tablet (50 mg total) by mouth 3 (three) times daily. 09/25/20   Josue Hector, MD  ipratropium-albuterol (DUONEB) 0.5-2.5 (3) MG/3ML SOLN Take 3 mLs by nebulization every 6 (six) hours as needed (copd). 01/16/20   Danford, Suann Larry, MD  isosorbide mononitrate (IMDUR) 30 MG 24  hr tablet Take 1 tablet (30 mg total) by mouth daily. 09/04/20   Regalado, Belkys A, MD  latanoprost (XALATAN) 0.005 % ophthalmic solution Place 1 drop into both eyes at bedtime.  04/02/19   [provider]  levothyroxine (SYNTHROID) 75 MCG tablet Take 1 tablet (75 mcg total) by mouth daily before breakfast. 07/04/20   Nafziger, Tommi Rumps, NP  pantoprazole (PROTONIX) 40 MG tablet Take 1 tablet (40 mg total) by mouth daily. 09/04/20   Regalado, Belkys A, MD  potassium chloride SA (KLOR-CON) 20 MEQ tablet Take 1 tablet (20 mEq total) by mouth daily. 09/04/20   Regalado, Belkys A, MD  predniSONE (DELTASONE) 5 MG tablet Take 5-10 mg by mouth daily with breakfast.    [provider]  Spacer/Aero-Holding Chambers (AEROCHAMBER MV) inhaler Use as instructed Patient taking differently: 1 each by Other route as directed. 11/26/17   Magdalen Spatz, NP  TRELEGY ELLIPTA 100-62.5-25 MCG/INH AEPB INHALE 1 PUFF BY MOUTH EVERY DAY Patient taking differently: Inhale 1 puff into the lungs daily. 08/09/20   Marshell Garfinkel, MD     Allergies    Catapres [clonidine hcl], Clonidine, Lisinopril, Labetalol, and Labetalol hcl  Review of Systems   Review of Systems  Constitutional: Negative for diaphoresis and fever.  Eyes: Negative for redness.  Respiratory: Negative for cough and shortness of breath.   Cardiovascular: Negative for chest pain, palpitations and leg swelling.  Gastrointestinal: Negative for abdominal pain, nausea and vomiting.  Genitourinary: Negative for dysuria.  Musculoskeletal: Negative for back pain and neck pain.  Skin: Negative for rash.  Neurological: Positive for weakness and light-headedness. Negative for syncope.  Psychiatric/Behavioral: The patient is not nervous/anxious.     Physical Exam Updated Vital Signs BP (!) 166/61 (BP Location: Left Arm)   Pulse 67   Temp 97.9 F (36.6 C) (Oral)   Resp 16   SpO2 97%   Physical Exam Vitals and nursing note reviewed.  Constitutional:      Appearance: She is well-developed. She is not diaphoretic.  HENT:     Head: Normocephalic and atraumatic.     Mouth/Throat:     Mouth: Mucous membranes are not dry.  Eyes:     Conjunctiva/sclera: Conjunctivae normal.  Neck:     Vascular: Normal carotid pulses. No carotid bruit or JVD.     Trachea: Trachea normal. No tracheal deviation.  Cardiovascular:     Rate and Rhythm: Normal rate and regular rhythm.     Pulses: No decreased pulses.     Heart sounds: Normal heart sounds, S1 normal and S2 normal. No murmur heard.   Pulmonary:     Effort: Pulmonary effort is normal. No respiratory distress.     Breath sounds: No wheezing.  Chest:     Chest wall: No tenderness.  Abdominal:     General: Bowel sounds are normal.     Palpations: Abdomen is soft.     Tenderness: There is no abdominal tenderness. There is no guarding or rebound.  Musculoskeletal:        General: Normal range of motion.     Cervical back: Normal range of motion and neck supple. No muscular tenderness.  Skin:    General:  Skin is warm and dry.     Coloration: Skin is not pale.  Neurological:     General: No focal deficit present.     Mental Status: She is alert. Mental status is at baseline.     Motor: No weakness.  Psychiatric:  Mood and Affect: Mood is anxious.     ED Results / Procedures / Treatments   Labs (all labs ordered are listed, but only abnormal results are displayed) Labs Reviewed  CBC WITH DIFFERENTIAL/PLATELET  BASIC METABOLIC PANEL    EKG None  Radiology No results found.  Procedures Procedures   Medications Ordered in ED Medications - No data to display  ED Course  I have reviewed the triage vital signs and the nursing notes.  Pertinent labs & imaging results that were available during my care of the patient were reviewed by me and considered in my medical decision making (see chart for details).  Patient seen and examined. Work-up initiated.   Vital signs reviewed and are as follows: BP (!) 161/57 (BP Location: Left Arm)   Pulse 69   Temp 97.9 F (36.6 C) (Oral)   Resp 18   SpO2 98%   I had a good discussion with patient and daughter who is now at bedside.  She reports that the patient took 5 mg of amlodipine, which they previously discontinued due to hypotension, and before bed blood pressure was down close to 120.  We discussed that if the blood pressure is above 180/110, she can consider taking the amlodipine in the evening, otherwise we will have her follow-up with hypertension clinic as planned.  They are comfortable with this plan.  Patient is anxious to go home.  Encouraged to only check blood pressure once per day at the same time per day.  She admits to becoming more anxious when her blood pressure is high.  We did discuss signs and symptoms of a stroke and need to use her life alert if these occur. Patient counseled to return if they have weakness in their arms or legs, slurred speech, trouble walking or talking, confusion, trouble with their balance,  or if they have any other concerns. Patient verbalizes understanding and agrees with plan.     MDM Rules/Calculators/A&P                          Patient with elevated blood pressure readings.  Several recent blood pressure medication changes.  They were encouraged to come to ED by PCP today.  Patient without any signs of endorgan damage.  She reports some generalized "wooziness" but no objective signs of stroke.  No signs of endorgan damage.  Creatinine is okay.  Plan as above.  Patient looks well and anxious to go home.    Final Clinical Impression(s) / ED Diagnoses Final diagnoses:  Primary hypertension    Rx / DC Orders ED Discharge Orders    None       Carlisle Cater, PA-C 10/13/20 [place]    Breck Coons, MD 10/14/20 [phone]

## 2020-10-13 NOTE — ED Notes (Signed)
Patient discharge instructions reviewed with the patient. Patient verbalized understanding of instructions. Patient discharged.

## 2020-10-13 NOTE — ED Notes (Signed)
Patient is resting comfortably. 

## 2020-10-13 NOTE — ED Notes (Signed)
Vital signs stable. 

## 2020-10-13 NOTE — Telephone Encounter (Signed)
Pt c/o BP issue: STAT if pt c/o blurred vision, one-sided weakness or slurred speech  1. What are your last 5 BP readings?  10/13/20 200/95-99 10/12/20 129/75 7:30 PM   2. Are you having any other symptoms (ex. Dizziness, headache, blurred vision, passed out)? Doesn't feel good or right, yesterday she was dizzy and shaking.  3. What is your BP issue? [name] is calling back about Violett's BP. She states she is on the way to her house now due to it being 200/95-99 this morning. Haeleigh gets very upset when her BP is high and [name] is unsure if she has taken her medications this morning. As the call was ending [name] was pulling into Afifa's driveway. She is requesting a callback to discuss while she is there. Please advise.

## 2020-10-13 NOTE — Telephone Encounter (Signed)
I spoke with the pts daughter and the pt is in the ED at the recommendation of her PCP for her BP.   She is asking to have follow up after her next appt with Kathyrn Drown NP at the [facility] office since it is a more convenient location and with Dr. Oval Linsey since she will be there seeing pts.   I advised her we would need to get approval from her and Dr. Johnsie Cancel.   She says that can be a future plan. I advised I will forward message to get it in process.

## 2020-10-13 NOTE — Telephone Encounter (Signed)
Fine for her to see Sharee Pimple and f/u with Dr Oval Linsey for BP

## 2020-10-19 DIAGNOSIS — M48062 Spinal stenosis, lumbar region with neurogenic claudication: Secondary | ICD-10-CM | POA: Diagnosis not present

## 2020-10-19 DIAGNOSIS — M5416 Radiculopathy, lumbar region: Secondary | ICD-10-CM | POA: Diagnosis not present

## 2020-10-24 ENCOUNTER — Ambulatory Visit: Payer: Medicare PPO

## 2020-10-31 ENCOUNTER — Emergency Department (HOSPITAL_BASED_OUTPATIENT_CLINIC_OR_DEPARTMENT_OTHER): Payer: Medicare PPO

## 2020-10-31 ENCOUNTER — Other Ambulatory Visit: Payer: Self-pay

## 2020-10-31 ENCOUNTER — Encounter (HOSPITAL_BASED_OUTPATIENT_CLINIC_OR_DEPARTMENT_OTHER): Payer: Self-pay | Admitting: Emergency Medicine

## 2020-10-31 ENCOUNTER — Emergency Department (HOSPITAL_BASED_OUTPATIENT_CLINIC_OR_DEPARTMENT_OTHER)
Admission: EM | Admit: 2020-10-31 | Discharge: 2020-10-31 | Disposition: A | Discharge status: Home / Self Care | Payer: Medicare PPO | Attending: Emergency Medicine | Admitting: Emergency Medicine

## 2020-10-31 DIAGNOSIS — Z7952 Long term (current) use of systemic steroids: Secondary | ICD-10-CM | POA: Diagnosis not present

## 2020-10-31 DIAGNOSIS — I130 Hypertensive heart and chronic kidney disease with heart failure and stage 1 through stage 4 chronic kidney disease, or unspecified chronic kidney disease: Secondary | ICD-10-CM | POA: Insufficient documentation

## 2020-10-31 DIAGNOSIS — N183 Chronic kidney disease, stage 3 unspecified: Secondary | ICD-10-CM | POA: Diagnosis not present

## 2020-10-31 DIAGNOSIS — R0602 Shortness of breath: Secondary | ICD-10-CM | POA: Diagnosis not present

## 2020-10-31 DIAGNOSIS — Z9861 Coronary angioplasty status: Secondary | ICD-10-CM | POA: Diagnosis not present

## 2020-10-31 DIAGNOSIS — J90 Pleural effusion, not elsewhere classified: Secondary | ICD-10-CM | POA: Diagnosis not present

## 2020-10-31 DIAGNOSIS — Z4502 Encounter for adjustment and management of automatic implantable cardiac defibrillator: Secondary | ICD-10-CM | POA: Diagnosis not present

## 2020-10-31 DIAGNOSIS — I5021 Acute systolic (congestive) heart failure: Secondary | ICD-10-CM | POA: Insufficient documentation

## 2020-10-31 DIAGNOSIS — R0600 Dyspnea, unspecified: Secondary | ICD-10-CM | POA: Diagnosis not present

## 2020-10-31 DIAGNOSIS — H353113 Nonexudative age-related macular degeneration, right eye, advanced atrophic without subfoveal involvement: Secondary | ICD-10-CM | POA: Diagnosis not present

## 2020-10-31 DIAGNOSIS — H353221 Exudative age-related macular degeneration, left eye, with active choroidal neovascularization: Secondary | ICD-10-CM | POA: Diagnosis not present

## 2020-10-31 DIAGNOSIS — E039 Hypothyroidism, unspecified: Secondary | ICD-10-CM | POA: Insufficient documentation

## 2020-10-31 DIAGNOSIS — Z87891 Personal history of nicotine dependence: Secondary | ICD-10-CM | POA: Diagnosis not present

## 2020-10-31 DIAGNOSIS — Z79899 Other long term (current) drug therapy: Secondary | ICD-10-CM | POA: Insufficient documentation

## 2020-10-31 DIAGNOSIS — J441 Chronic obstructive pulmonary disease with (acute) exacerbation: Secondary | ICD-10-CM | POA: Insufficient documentation

## 2020-10-31 DIAGNOSIS — I110 Hypertensive heart disease with heart failure: Secondary | ICD-10-CM | POA: Diagnosis not present

## 2020-10-31 DIAGNOSIS — I509 Heart failure, unspecified: Secondary | ICD-10-CM

## 2020-10-31 LAB — BASIC METABOLIC PANEL
Anion gap: 7 (ref 5–15)
BUN: 26 mg/dL — ABNORMAL HIGH (ref 8–23)
CO2: 30 mmol/L (ref 22–32)
Calcium: 8.8 mg/dL — ABNORMAL LOW (ref 8.9–10.3)
Chloride: 102 mmol/L (ref 98–111)
Creatinine, Ser: 1.19 mg/dL — ABNORMAL HIGH (ref 0.44–1.00)
GFR, Estimated: 43 mL/min — ABNORMAL LOW (ref >60)
Glucose, Bld: 133 mg/dL — ABNORMAL HIGH (ref 70–99)
Potassium: 3.8 mmol/L (ref 3.5–5.1)
Sodium: 139 mmol/L (ref 135–145)

## 2020-10-31 LAB — CBC
HCT: 39 % (ref 36.0–46.0)
Hemoglobin: 12.5 g/dL (ref 12.0–15.0)
MCH: 30.5 pg (ref 26.0–34.0)
MCHC: 32.1 g/dL (ref 30.0–36.0)
MCV: 95.1 fL (ref 80.0–100.0)
Platelets: 143 10*3/uL — ABNORMAL LOW (ref 150–400)
RBC: 4.1 MIL/uL (ref 3.87–5.11)
RDW: 14.5 % (ref 11.5–15.5)
WBC: 6.6 10*3/uL (ref 4.0–10.5)
nRBC: 0 % (ref 0.0–0.2)

## 2020-10-31 LAB — BRAIN NATRIURETIC PEPTIDE: B Natriuretic Peptide: 430.6 pg/mL — ABNORMAL HIGH (ref 0.0–100.0)

## 2020-10-31 NOTE — Discharge Instructions (Addendum)
Take an extra dose of the Lasix today.  Follow-up with your doctors.  Return for worsening symptoms.

## 2020-10-31 NOTE — ED Triage Notes (Signed)
Sob since last night , pt hyper ventilating at this time

## 2020-10-31 NOTE — ED Provider Notes (Signed)
[facility] EMERGENCY DEPT Provider Note   CSN: [id number] Arrival date & time: 10/31/20  2440     History Chief Complaint  Patient presents with  . Shortness of Breath    [name] is a 87 y.o. female.  HPI Patient presents with shortness of breath.  Began last night.  Has been somewhat constant.  Worse this morning.  Had used her inhaler last night and her nebulizer today without relief.  No fevers or chills.  No chest pain.  History of COPD and CHF.  Patient states she feels as if her fluid status is pretty good.  They have been having difficulty weighing her at home because she does not stand well enough to stand on a scale.  States that on Saturday with today being Tuesday weight was good however.  Feeling somewhat better upon arrival.  Had a half of Xanax given to her by her daughter and feels much better now.    Past Medical History:  Diagnosis Date  . Anxiety   . Aortic arch atherosclerosis ([place]) 06/24/2014  . Atherosclerotic ulcer of aorta ([place]) 06/24/2014  . Carotid artery occlusion   . Chronic kidney disease   . COPD (chronic obstructive pulmonary disease) ([place])   . [place] DISEASE, LUMBAR 12/16/2008  . DIVERTICULOSIS, COLON 09/30/2008  . DYSPNEA 07/13/2008  . Graves disease   . History of embolic stroke 07/10/7251   Left brain  . HYPERLIPIDEMIA 03/06/2007  . HYPERTENSION 03/06/2007  . HYPOTHYROIDISM 10/13/2007  . OSTEOARTHRITIS 03/06/2007  . Personal history of colonic polyps 09/30/2008  . Stroke ([place])    06/2014           . TOBACCO USE, QUIT 04/12/2009  . WEAKNESS 11/09/2007    Patient Active Problem List   Diagnosis Date Noted  . Acute systolic CHF (congestive heart failure) ([place]) 09/01/2020  . Acute respiratory failure ([place])   . Mild aortic stenosis 08/30/2020  . Elevated troponin 08/30/2020  . Pleural effusion on left 08/30/2020  . Pain in joint of left knee 04/10/2020  . D-dimer, elevated 01/14/2020  . Elevated brain natriuretic peptide (BNP)  level 01/14/2020  . Advice given about COVID-19 virus infection 07/21/2019  . Current chronic use of systemic steroids 07/13/2019  . Medication management 04/02/2018  . COPD exacerbation ([place]) 03/31/2018  . Hypokalemia 03/30/2018  . COPD with acute exacerbation ([place]) 03/02/2018  . SOB (shortness of breath) 01/12/2018  . CKD (chronic kidney disease), stage III 01/12/2018  . New onset left bundle branch block (LBBB) 01/12/2018  . COPD GOLD I D 01/12/2018  . Neurogenic claudication due to lumbar spinal stenosis 09/13/2017  . Symptomatic anemia   . AVM (arteriovenous malformation) of duodenum, acquired with hemorrhage   . Acute GI bleeding 10/09/2016  . COPD with emphysema ([place]) 05/10/2016  . Recurrent laryngeal neuropathy 02/01/2016  . Nausea with vomiting 12/11/2015  . Left carotid stenosis 11/21/2015  . Asymptomatic carotid artery stenosis 11/13/2015  . Impaired glucose tolerance 10/27/2015  . Solitary pulmonary nodule 10/27/2015  . Thyroid nodule 10/27/2015  . Syncope 10/04/2015  . Bradycardia with less than 60 beats per minute 10/04/2015  . History of embolic stroke 66/44/0347  . Paresthesia of both feet 07/06/2014  . Aortic arch atherosclerosis ([place]) 06/24/2014  . Atherosclerotic ulcer of aorta ([place]) 06/24/2014  . Stroke ([place]) 06/22/2014  . Bilateral carotid artery disease ([place]) 05/18/2014  . TOBACCO USE, QUIT 04/12/2009  . [place] DISEASE, LUMBAR 12/16/2008  . DIVERTICULOSIS, COLON 09/30/2008  . Personal history of  colonic polyps 09/30/2008  . DYSPNEA 07/13/2008  . Weakness 11/09/2007  . Hypothyroidism 10/13/2007  . Mixed hyperlipidemia 03/06/2007  . Essential hypertension 03/06/2007  . Osteoarthritis 03/06/2007    Past Surgical History:  Procedure Laterality Date  . CARDIAC CATHETERIZATION    . CATARACT EXTRACTION Bilateral   . CHOLECYSTECTOMY    . ENDARTERECTOMY Left 11/13/2015   Procedure: LEFT CAROTID ARTERY ENDARTERECTOMY;  Surgeon: Conrad [place], MD;  Location: [facility];  Service: Vascular;  Laterality: Left;  . ESOPHAGOGASTRODUODENOSCOPY (EGD) WITH PROPOFOL N/A 10/11/2016   Procedure: ESOPHAGOGASTRODUODENOSCOPY (EGD) WITH PROPOFOL;  Surgeon: Mauri Pole, MD;  Location: [facility] END[facility];  Service: Endoscopy;  Laterality: N/A;  . KNEE SURGERY    . PATCH ANGIOPLASTY Left 11/13/2015   Procedure: WITH 1CM X 6CM  XENOSURE BIOLOGIC PATCH ANGIOPLASTY;  Surgeon: Conrad [place], MD;  Location: [facility];  Service: Vascular;  Laterality: Left;  . TEE WITHOUT CARDIOVERSION N/A 06/24/2014   Procedure: TRANSESOPHAGEAL ECHOCARDIOGRAM (TEE);  Surgeon: Sanda Klein, MD;  Location: [facility] ENDOSCOPY;  Service: Cardiovascular;  Laterality: N/A;     OB History   No obstetric history on file.     Family History  Problem Relation Age of Onset  . Stomach cancer Maternal Grandmother   . Colon cancer Neg Hx     Social History   Tobacco Use  . Smoking status: Former Smoker    Packs/day: 1.50    Years: 29.00    Pack years: 43.50    Types: Cigarettes    Start date: 07/08/1949    Quit date: 07/08/1978    Years since quitting: 42.3  . Smokeless tobacco: Never Used  Vaping Use  . Vaping Use: Never used  Substance Use Topics  . Alcohol use: Yes    Alcohol/week: 1.0 standard drink    Types: 1 Glasses of wine per week    Comment: "red wine"- some nights  . Drug use: No    Home Medications Prior to Admission medications   Medication Sig Start Date End Date Taking? Authorizing Provider  albuterol (PROAIR HFA) 108 (90 Base) MCG/ACT inhaler Inhale 2 puffs into the lungs every 6 (six) hours as needed for wheezing or shortness of breath. 11/13/17   Volanda Napoleon, PA-C  ALPRAZolam Duanne Moron) 0.5 MG tablet Take 0.5-1 tablets (0.25-0.5 mg total) by mouth 2 (two) times daily as needed for anxiety. 09/05/20   Nafziger, Tommi Rumps, NP  amLODipine (NORVASC) 5 MG tablet Take 1 tablet (5 mg total) by mouth daily. 10/12/20   Josue Hector, MD  atorvastatin (LIPITOR) 80 MG tablet TAKE 1 TABLET BY MOUTH  EVERY DAY AT 6PM Patient taking differently: Take 80 mg by mouth daily. 04/20/20   Nafziger, Tommi Rumps, NP  furosemide (LASIX) 20 MG tablet Take 1 tablet (20 mg total) by mouth daily. 09/05/20   Regalado, Belkys A, MD  hydrALAZINE (APRESOLINE) 50 MG tablet Take 1 tablet (50 mg total) by mouth 3 (three) times daily. 09/25/20   Josue Hector, MD  ipratropium-albuterol (DUONEB) 0.5-2.5 (3) MG/3ML SOLN Take 3 mLs by nebulization every 6 (six) hours as needed (copd). 01/16/20   Danford, Suann Larry, MD  isosorbide mononitrate (IMDUR) 30 MG 24 hr tablet Take 1 tablet (30 mg total) by mouth daily. 09/04/20   Regalado, Belkys A, MD  latanoprost (XALATAN) 0.005 % ophthalmic solution Place 1 drop into both eyes at bedtime.  04/02/19   [provider]  levothyroxine (SYNTHROID) 75 MCG tablet Take 1 tablet (75 mcg total) by  mouth daily before breakfast. 07/04/20   Nafziger, Tommi Rumps, NP  pantoprazole (PROTONIX) 40 MG tablet Take 1 tablet (40 mg total) by mouth daily. 09/04/20   Regalado, Belkys A, MD  potassium chloride SA (KLOR-CON) 20 MEQ tablet Take 1 tablet (20 mEq total) by mouth daily. 09/04/20   Regalado, Belkys A, MD  predniSONE (DELTASONE) 5 MG tablet Take 5-10 mg by mouth daily with breakfast.    [provider]  Spacer/Aero-Holding Chambers (AEROCHAMBER MV) inhaler Use as instructed Patient taking differently: 1 each by Other route as directed. 11/26/17   Magdalen Spatz, NP  TRELEGY ELLIPTA 100-62.5-25 MCG/INH AEPB INHALE 1 PUFF BY MOUTH EVERY DAY Patient taking differently: Inhale 1 puff into the lungs daily. 08/09/20   Marshell Garfinkel, MD    Allergies    Catapres [clonidine hcl], Clonidine, Lisinopril, Labetalol, and Labetalol hcl  Review of Systems   Review of Systems  Constitutional: Negative for appetite change, fatigue and fever.  Respiratory: Positive for shortness of breath.   Cardiovascular: Negative for chest pain.  Gastrointestinal: Negative for abdominal pain.  Genitourinary:  Negative for flank pain.  Musculoskeletal: Negative for back pain.  Neurological: Negative for weakness.  Psychiatric/Behavioral: Negative for confusion.    Physical Exam Updated Vital Signs BP (!) 156/66 (BP Location: Right Arm)   Pulse 69   Temp 97.8 F (36.6 C) (Oral)   Resp 16   SpO2 95%   Physical Exam Vitals and nursing note reviewed.  HENT:     Head: Normocephalic.  Eyes:     Extraocular Movements: Extraocular movements intact.  Cardiovascular:     Rate and Rhythm: Normal rate. Rhythm irregular.  Pulmonary:     Comments: Harsh breath sounds without wheezing.  May be more harsh at left base. Chest:     Chest wall: No tenderness.  Abdominal:     Tenderness: There is no abdominal tenderness.  Musculoskeletal:     Right lower leg: Tenderness present. No edema.     Left lower leg: Tenderness present. No edema.  Skin:    General: Skin is warm.     Capillary Refill: Capillary refill takes less than 2 seconds.  Neurological:     Mental Status: She is alert and oriented to person, place, and time.     ED Results / Procedures / Treatments   Labs (all labs ordered are listed, but only abnormal results are displayed) Labs Reviewed  BRAIN NATRIURETIC PEPTIDE - Abnormal; Notable for the following components:      Result Value   B Natriuretic Peptide 430.6 (*)    All other components within normal limits  CBC - Abnormal; Notable for the following components:   Platelets 143 (*)    All other components within normal limits  BASIC METABOLIC PANEL - Abnormal; Notable for the following components:   Glucose, Bld 133 (*)    BUN 26 (*)    Creatinine, Ser 1.19 (*)    Calcium 8.8 (*)    GFR, Estimated 43 (*)    All other components within normal limits    EKG EKG Interpretation  Date/Time:  Tuesday October 31 2020 09:16:36 EDT Ventricular Rate:  88 PR Interval:  161 QRS Duration: 119 QT Interval:  445 QTC Calculation: 539 R Axis:   -16 Text Interpretation: sinus  with pac LVH with secondary repolarization abnormality Anterior infarct, old Prolonged QT interval Confirmed by Davonna Belling [phone]) on 10/31/2020 9:41:41 AM   Radiology DG Chest Portable 1 View  Result Date: 10/31/2020  CLINICAL DATA:  Shortness of breath and difficulty breathing. EXAM: PORTABLE CHEST 1 VIEW COMPARISON:  09/20/2020 FINDINGS: Heart size is normal. Chronic aortic atherosclerotic calcification. Right lung shows mild scarring but no acute finding. On the left, there is pleural blunting as seen previously consistent with a small effusion with mild left base volume loss. Chronic degenerative changes affect the shoulders. IMPRESSION: Persistent small left effusion with mild left base volume loss. Aortic atherosclerosis. Electronically Signed   By: Nelson Chimes M.D.   On: 10/31/2020 09:56    Procedures Procedures   Medications Ordered in ED Medications - No data to display  ED Course  I have reviewed the triage vital signs and the nursing notes.  Pertinent labs & imaging results that were available during my care of the patient were reviewed by me and considered in my medical decision making (see chart for details).    MDM Rules/Calculators/A&P                          Patient presents with shortness of breath.  Began last night but continued today but feels much better upon arrival to ER.  Not hypoxic.  History of CHF.  Unable to get weight on patient at home due to her not being able to stand with her walker easily but as of Saturday had had a good weight.  Has some harsh breath sounds at the bases.  BNP is elevated above what it had been recently including admission for CHF.  However not hypoxic.  Not requiring oxygen.  Chest x-ray rather stable.  Patient has Lasix at home that day will increase the dose if she has her weight go up.  I think she will benefit from an extra dose today.  At that point she can follow-up with her primary care doctor and cardiology and see how she  is doing.  Doubt pneumonia.  Doubt pulm embolism.  Discharge home Final Clinical Impression(s) / ED Diagnoses Final diagnoses:  Dyspnea, unspecified type  Congestive heart failure, unspecified HF chronicity, unspecified heart failure type [facility])    Rx / DC Orders ED Discharge Orders    None       Davonna Belling, MD 10/31/20 1235

## 2020-11-08 DIAGNOSIS — M48062 Spinal stenosis, lumbar region with neurogenic claudication: Secondary | ICD-10-CM | POA: Diagnosis not present

## 2020-11-09 NOTE — Telephone Encounter (Signed)
Dr Oval Linsey ok with patient following up with her at [facility]

## 2020-11-17 ENCOUNTER — Encounter (HOSPITAL_BASED_OUTPATIENT_CLINIC_OR_DEPARTMENT_OTHER): Payer: Self-pay | Admitting: Emergency Medicine

## 2020-11-17 ENCOUNTER — Emergency Department (HOSPITAL_BASED_OUTPATIENT_CLINIC_OR_DEPARTMENT_OTHER): Payer: Medicare PPO

## 2020-11-17 ENCOUNTER — Other Ambulatory Visit: Payer: Self-pay

## 2020-11-17 ENCOUNTER — Emergency Department (HOSPITAL_BASED_OUTPATIENT_CLINIC_OR_DEPARTMENT_OTHER)
Admission: EM | Admit: 2020-11-17 | Discharge: 2020-11-17 | Disposition: A | Discharge status: Home / Self Care | Payer: Medicare PPO | Attending: Emergency Medicine | Admitting: Emergency Medicine

## 2020-11-17 DIAGNOSIS — I509 Heart failure, unspecified: Secondary | ICD-10-CM | POA: Diagnosis not present

## 2020-11-17 DIAGNOSIS — J449 Chronic obstructive pulmonary disease, unspecified: Secondary | ICD-10-CM | POA: Diagnosis not present

## 2020-11-17 DIAGNOSIS — N183 Chronic kidney disease, stage 3 unspecified: Secondary | ICD-10-CM | POA: Diagnosis not present

## 2020-11-17 DIAGNOSIS — Z79899 Other long term (current) drug therapy: Secondary | ICD-10-CM | POA: Insufficient documentation

## 2020-11-17 DIAGNOSIS — N3000 Acute cystitis without hematuria: Secondary | ICD-10-CM | POA: Diagnosis not present

## 2020-11-17 DIAGNOSIS — I130 Hypertensive heart and chronic kidney disease with heart failure and stage 1 through stage 4 chronic kidney disease, or unspecified chronic kidney disease: Secondary | ICD-10-CM | POA: Diagnosis not present

## 2020-11-17 DIAGNOSIS — I5021 Acute systolic (congestive) heart failure: Secondary | ICD-10-CM | POA: Diagnosis not present

## 2020-11-17 DIAGNOSIS — Z87891 Personal history of nicotine dependence: Secondary | ICD-10-CM | POA: Diagnosis not present

## 2020-11-17 DIAGNOSIS — I110 Hypertensive heart disease with heart failure: Secondary | ICD-10-CM | POA: Diagnosis not present

## 2020-11-17 DIAGNOSIS — E039 Hypothyroidism, unspecified: Secondary | ICD-10-CM | POA: Diagnosis not present

## 2020-11-17 DIAGNOSIS — J9811 Atelectasis: Secondary | ICD-10-CM | POA: Diagnosis not present

## 2020-11-17 DIAGNOSIS — J90 Pleural effusion, not elsewhere classified: Secondary | ICD-10-CM | POA: Diagnosis not present

## 2020-11-17 DIAGNOSIS — R0602 Shortness of breath: Secondary | ICD-10-CM | POA: Diagnosis not present

## 2020-11-17 LAB — BASIC METABOLIC PANEL
Anion gap: 8 (ref 5–15)
BUN: 19 mg/dL (ref 8–23)
CO2: 29 mmol/L (ref 22–32)
Calcium: 8.4 mg/dL — ABNORMAL LOW (ref 8.9–10.3)
Chloride: 103 mmol/L (ref 98–111)
Creatinine, Ser: 1.11 mg/dL — ABNORMAL HIGH (ref 0.44–1.00)
GFR, Estimated: 46 mL/min — ABNORMAL LOW (ref >60)
Glucose, Bld: 123 mg/dL — ABNORMAL HIGH (ref 70–99)
Potassium: 3.6 mmol/L (ref 3.5–5.1)
Sodium: 140 mmol/L (ref 135–145)

## 2020-11-17 LAB — CBC WITH DIFFERENTIAL/PLATELET
Abs Immature Granulocytes: 0.01 10*3/uL (ref 0.00–0.07)
Basophils Absolute: 0 10*3/uL (ref 0.0–0.1)
Basophils Relative: 1 %
Eosinophils Absolute: 0.2 10*3/uL (ref 0.0–0.5)
Eosinophils Relative: 4 %
HCT: 36.1 % (ref 36.0–46.0)
Hemoglobin: 11.7 g/dL — ABNORMAL LOW (ref 12.0–15.0)
Immature Granulocytes: 0 %
Lymphocytes Relative: 17 %
Lymphs Abs: 0.9 10*3/uL (ref 0.7–4.0)
MCH: 31 pg (ref 26.0–34.0)
MCHC: 32.4 g/dL (ref 30.0–36.0)
MCV: 95.5 fL (ref 80.0–100.0)
Monocytes Absolute: 0.8 10*3/uL (ref 0.1–1.0)
Monocytes Relative: 14 %
Neutro Abs: 3.6 10*3/uL (ref 1.7–7.7)
Neutrophils Relative %: 64 %
Platelets: 145 10*3/uL — ABNORMAL LOW (ref 150–400)
RBC: 3.78 MIL/uL — ABNORMAL LOW (ref 3.87–5.11)
RDW: 14.5 % (ref 11.5–15.5)
WBC: 5.5 10*3/uL (ref 4.0–10.5)
nRBC: 0 % (ref 0.0–0.2)

## 2020-11-17 LAB — I-STAT VENOUS BLOOD GAS, ED
Acid-Base Excess: 3 mmol/L — ABNORMAL HIGH (ref 0.0–2.0)
Bicarbonate: 28.6 mmol/L — ABNORMAL HIGH (ref 20.0–28.0)
Calcium, Ion: 1.27 mmol/L (ref 1.15–1.40)
HCT: 34 % — ABNORMAL LOW (ref 36.0–46.0)
Hemoglobin: 11.6 g/dL — ABNORMAL LOW (ref 12.0–15.0)
O2 Saturation: 59 %
Patient temperature: 98.6
Potassium: 3.7 mmol/L (ref 3.5–5.1)
Sodium: 139 mmol/L (ref 135–145)
TCO2: 30 mmol/L (ref 22–32)
pCO2, Ven: 46.3 mmHg (ref 44.0–60.0)
pH, Ven: 7.4 (ref 7.250–7.430)
pO2, Ven: 31 mmHg — CL (ref 32.0–45.0)

## 2020-11-17 LAB — URINALYSIS, ROUTINE W REFLEX MICROSCOPIC
Bilirubin Urine: NEGATIVE
Glucose, UA: NEGATIVE mg/dL
Hgb urine dipstick: NEGATIVE
Ketones, ur: NEGATIVE mg/dL
Nitrite: NEGATIVE
Protein, ur: NEGATIVE mg/dL
Specific Gravity, Urine: <1.005 — ABNORMAL LOW (ref 1.005–1.030)
WBC, UA: >50 WBC/hpf — ABNORMAL HIGH (ref 0–5)
pH: 7 (ref 5.0–8.0)

## 2020-11-17 LAB — BRAIN NATRIURETIC PEPTIDE: B Natriuretic Peptide: 392.3 pg/mL — ABNORMAL HIGH (ref 0.0–100.0)

## 2020-11-17 LAB — TROPONIN I (HIGH SENSITIVITY)
Troponin I (High Sensitivity): 68 ng/L — ABNORMAL HIGH (ref <18)
Troponin I (High Sensitivity): 64 ng/L — ABNORMAL HIGH (ref <18)

## 2020-11-17 MED ORDER — FUROSEMIDE 10 MG/ML IJ SOLN
20.0000 mg | Freq: Once | INTRAMUSCULAR | Status: AC
  Administered 2020-11-17: 20 mg via INTRAVENOUS
  Filled 2020-11-17: qty 2

## 2020-11-17 MED ORDER — ALPRAZOLAM 0.5 MG PO TABS
0.5000 mg | ORAL_TABLET | Freq: Once | ORAL | Status: AC
  Administered 2020-11-17: 0.5 mg via ORAL
  Filled 2020-11-17: qty 1

## 2020-11-17 MED ORDER — CEPHALEXIN 500 MG PO CAPS: 500.0000 mg | ORAL_CAPSULE | Freq: Three times a day (TID) | ORAL | 0 refills | Status: AC

## 2020-11-17 NOTE — ED Notes (Signed)
Pt assisted to bsc, could not void with purewick. After sitting for about  4 minutes patient started feeling dizzy and "not right in my head." assisted back to bed, heart rate 64, bp 174/76,oxygen 97 on room air, resp 34, reports still short of breath and feeling not right, now sfays vision is blurry.md alerted

## 2020-11-17 NOTE — ED Triage Notes (Signed)
Pt arrives to ED with c/o of shortness of breath that started acutely this morning after waking up. Pt with hx of CHF. RR >40 at this time. No fevers, headache, CP, or chills.

## 2020-11-17 NOTE — ED Notes (Signed)
Pt was able to void 350 cc clear yellow urine after initial bladder scan. PVR was 530cc. I/O cathed for 800 cc clear urine.

## 2020-11-17 NOTE — ED Notes (Signed)
Flushed pts bilateral ears per ER protocol (physican ordered). Pt asked RN to stop and RN stopped. No adverse side effects.

## 2020-11-17 NOTE — ED Notes (Signed)
Pt given Ginger Ale, crackers and peanut butter, per Levada Dy - RN.

## 2020-11-17 NOTE — Discharge Instructions (Addendum)
The urine sample showed a possible urinary tract infection.  Please take the antibiotic as prescribed.  For Saturday and Sunday I would recommend doubling your dose of Lasix to 40 mg once daily (2 tablets).  Then on Monday, resume your regular dose.  This afternoon, please call both your primary care office and cardiology office to request close follow-up next week for recheck.  If you have any concerns for urinary retention, fever, difficulty in breathing, chest pain or other new concerns, come back to ER for reassessment.

## 2020-11-17 NOTE — ED Provider Notes (Signed)
[facility] EMERGENCY DEPT Provider Note   CSN: [id number] Arrival date & time: 11/17/20  [id number]     History Chief Complaint  Patient presents with  . Shortness of Breath    Cathie AMANA CALLANAN is a 87 y.o. female.  Extensive past medical history including history of heart failure presenting to ER with concern for shortness of breath.  Patient provided some history, transfer obtained from daughter at bedside.  Patient was in her normal state of health yesterday, asymptomatic.  Woke up this morning and felt very short of breath, has difficulty catching her breath.  Worse with talking, worse with exertion.  Improved with rest.  No associated cough, no fever, no chest pain or chest tightness.  Similar to prior episode a couple weeks ago.  And took an extra dose of her anxiety medicine and this helped her symptoms.  Still feels somewhat short of breath. Unsure of weight gain/loss. No leg swelling today.   HPI     Past Medical History:  Diagnosis Date  . Anxiety   . Aortic arch atherosclerosis ([place]) 06/24/2014  . Atherosclerotic ulcer of aorta ([place]) 06/24/2014  . Carotid artery occlusion   . Chronic kidney disease   . COPD (chronic obstructive pulmonary disease) ([place])   . [place] DISEASE, LUMBAR 12/16/2008  . DIVERTICULOSIS, COLON 09/30/2008  . DYSPNEA 07/13/2008  . Graves disease   . History of embolic stroke [id number]   Left brain  . HYPERLIPIDEMIA 03/06/2007  . HYPERTENSION 03/06/2007  . HYPOTHYROIDISM 10/13/2007  . OSTEOARTHRITIS 03/06/2007  . Personal history of colonic polyps 09/30/2008  . Stroke ([place])    06/2014           . TOBACCO USE, QUIT 04/12/2009  . WEAKNESS 11/09/2007    Patient Active Problem List   Diagnosis Date Noted  . Acute systolic CHF (congestive heart failure) ([place]) 09/01/2020  . Acute respiratory failure ([place])   . Mild aortic stenosis 08/30/2020  . Elevated troponin 08/30/2020  . Pleural effusion on left 08/30/2020  . Pain in joint of left knee 04/10/2020   . D-dimer, elevated 01/14/2020  . Elevated brain natriuretic peptide (BNP) level 01/14/2020  . Advice given about COVID-19 virus infection 07/21/2019  . Current chronic use of systemic steroids 07/13/2019  . Medication management 04/02/2018  . COPD exacerbation ([place]) 03/31/2018  . Hypokalemia 03/30/2018  . COPD with acute exacerbation ([place]) 03/02/2018  . SOB (shortness of breath) 01/12/2018  . CKD (chronic kidney disease), stage III 01/12/2018  . New onset left bundle branch block (LBBB) 01/12/2018  . COPD GOLD I D 01/12/2018  . Neurogenic claudication due to lumbar spinal stenosis 09/13/2017  . Symptomatic anemia   . AVM (arteriovenous malformation) of duodenum, acquired with hemorrhage   . Acute GI bleeding 10/09/2016  . COPD with emphysema ([place]) 05/10/2016  . Recurrent laryngeal neuropathy 02/01/2016  . Nausea with vomiting 12/11/2015  . Left carotid stenosis 11/21/2015  . Asymptomatic carotid artery stenosis 11/13/2015  . Impaired glucose tolerance 10/27/2015  . Solitary pulmonary nodule 10/27/2015  . Thyroid nodule 10/27/2015  . Syncope 10/04/2015  . Bradycardia with less than 60 beats per minute 10/04/2015  . History of embolic stroke [id number]  . Paresthesia of both feet 07/06/2014  . Aortic arch atherosclerosis ([place]) 06/24/2014  . Atherosclerotic ulcer of aorta ([place]) 06/24/2014  . Stroke ([place]) 06/22/2014  . Bilateral carotid artery disease ([place]) 05/18/2014  . TOBACCO USE, QUIT 04/12/2009  . [place] DISEASE, LUMBAR 12/16/2008  . DIVERTICULOSIS, COLON 09/30/2008  .  Personal history of colonic polyps 09/30/2008  . DYSPNEA 07/13/2008  . Weakness 11/09/2007  . Hypothyroidism 10/13/2007  . Mixed hyperlipidemia 03/06/2007  . Essential hypertension 03/06/2007  . Osteoarthritis 03/06/2007    Past Surgical History:  Procedure Laterality Date  . CARDIAC CATHETERIZATION    . CATARACT EXTRACTION Bilateral   . CHOLECYSTECTOMY    . ENDARTERECTOMY Left 11/13/2015   Procedure:  LEFT CAROTID ARTERY ENDARTERECTOMY;  Surgeon: Conrad Summerfield, MD;  Location: [facility];  Service: Vascular;  Laterality: Left;  . ESOPHAGOGASTRODUODENOSCOPY (EGD) WITH PROPOFOL N/A 10/11/2016   Procedure: ESOPHAGOGASTRODUODENOSCOPY (EGD) WITH PROPOFOL;  Surgeon: Mauri Pole, MD;  Location: [facility] ENDOSCOPY;  Service: Endoscopy;  Laterality: N/A;  . KNEE SURGERY    . PATCH ANGIOPLASTY Left 11/13/2015   Procedure: WITH 1CM X 6CM  XENOSURE BIOLOGIC PATCH ANGIOPLASTY;  Surgeon: Conrad Nickerson, MD;  Location: [facility];  Service: Vascular;  Laterality: Left;  . TEE WITHOUT CARDIOVERSION N/A 06/24/2014   Procedure: TRANSESOPHAGEAL ECHOCARDIOGRAM (TEE);  Surgeon: Sanda Klein, MD;  Location: [facility] ENDOSCOPY;  Service: Cardiovascular;  Laterality: N/A;     OB History   No obstetric history on file.     Family History  Problem Relation Age of Onset  . Stomach cancer Maternal Grandmother   . Colon cancer Neg Hx     Social History   Tobacco Use  . Smoking status: Former Smoker    Packs/day: 1.50    Years: 29.00    Pack years: 43.50    Types: Cigarettes    Start date: 07/08/1949    Quit date: 07/08/1978    Years since quitting: 42.3  . Smokeless tobacco: Never Used  Vaping Use  . Vaping Use: Never used  Substance Use Topics  . Alcohol use: Yes    Alcohol/week: 1.0 standard drink    Types: 1 Glasses of wine per week    Comment: "red wine"- some nights  . Drug use: No    Home Medications Prior to Admission medications   Medication Sig Start Date End Date Taking? Authorizing Provider  albuterol (PROAIR HFA) 108 (90 Base) MCG/ACT inhaler Inhale 2 puffs into the lungs every 6 (six) hours as needed for wheezing or shortness of breath. 11/13/17  Yes Volanda Napoleon, PA-C  ALPRAZolam (XANAX) 0.5 MG tablet Take 0.5-1 tablets (0.25-0.5 mg total) by mouth 2 (two) times daily as needed for anxiety. 09/05/20  Yes Nafziger, Tommi Rumps, NP  amLODipine (NORVASC) 5 MG tablet Take 1 tablet (5 mg total) by mouth daily.  10/12/20  Yes Josue Hector, MD  atorvastatin (LIPITOR) 80 MG tablet TAKE 1 TABLET BY MOUTH EVERY DAY AT 6PM Patient taking differently: Take 80 mg by mouth daily. 04/20/20  Yes Nafziger, Tommi Rumps, NP  cephALEXin (KEFLEX) 500 MG capsule Take 1 capsule (500 mg total) by mouth 3 (three) times daily for 5 days. 11/17/20 11/22/20 Yes Delray Reza, Ellwood Dense, MD  furosemide (LASIX) 20 MG tablet Take 1 tablet (20 mg total) by mouth daily. 09/05/20  Yes Regalado, Belkys A, MD  ipratropium-albuterol (DUONEB) 0.5-2.5 (3) MG/3ML SOLN Take 3 mLs by nebulization every 6 (six) hours as needed (copd). 01/16/20  Yes Danford, Suann Larry, MD  isosorbide mononitrate (IMDUR) 30 MG 24 hr tablet Take 1 tablet (30 mg total) by mouth daily. 09/04/20  Yes Regalado, Belkys A, MD  latanoprost (XALATAN) 0.005 % ophthalmic solution Place 1 drop into both eyes at bedtime.  04/02/19  Yes [provider]  levothyroxine (SYNTHROID) 75 MCG tablet Take  1 tablet (75 mcg total) by mouth daily before breakfast. 07/04/20  Yes Nafziger, Tommi Rumps, NP  pantoprazole (PROTONIX) 40 MG tablet Take 1 tablet (40 mg total) by mouth daily. 09/04/20  Yes Regalado, Belkys A, MD  potassium chloride SA (KLOR-CON) 20 MEQ tablet Take 1 tablet (20 mEq total) by mouth daily. 09/04/20  Yes Regalado, Cassie Freer, MD  Spacer/Aero-Holding Chambers (AEROCHAMBER MV) inhaler Use as instructed Patient taking differently: 1 each by Other route as directed. 11/26/17  Yes Magdalen Spatz, NP  TRELEGY ELLIPTA 100-62.5-25 MCG/INH AEPB INHALE 1 PUFF BY MOUTH EVERY DAY Patient taking differently: Inhale 1 puff into the lungs daily. 08/09/20  Yes Mannam, Praveen, MD  hydrALAZINE (APRESOLINE) 50 MG tablet Take 1 tablet (50 mg total) by mouth 3 (three) times daily. 09/25/20   Josue Hector, MD  predniSONE (DELTASONE) 5 MG tablet Take 5-10 mg by mouth daily with breakfast.    [provider]    Allergies    Catapres [clonidine hcl], Clonidine, Lisinopril, Labetalol, and  Labetalol hcl  Review of Systems   Review of Systems  Constitutional: Negative for chills and fever.  HENT: Negative for ear pain and sore throat.   Eyes: Negative for pain and visual disturbance.  Respiratory: Positive for shortness of breath. Negative for cough.   Cardiovascular: Negative for chest pain and palpitations.  Gastrointestinal: Negative for abdominal pain and vomiting.  Genitourinary: Negative for dysuria and hematuria.  Musculoskeletal: Negative for arthralgias and back pain.  Skin: Negative for color change and rash.  Neurological: Negative for seizures and syncope.  All other systems reviewed and are negative.   Physical Exam Updated Vital Signs BP (!) 158/85   Pulse 70   Temp 97.6 F (36.4 C) (Oral)   Resp (!) 22   Ht 5\' 6"  (1.676 m)   Wt 57.2 kg   SpO2 97%   BMI 20.34 kg/m   Physical Exam Vitals and nursing note reviewed.  Constitutional:      General: She is not in acute distress.    Appearance: She is well-developed.     Comments: Appears mildly anxious, slightly dyspneic but not in distress  HENT:     Head: Normocephalic and atraumatic.  Eyes:     Conjunctiva/sclera: Conjunctivae normal.  Cardiovascular:     Rate and Rhythm: Normal rate and regular rhythm.     Heart sounds: No murmur heard.   Pulmonary:     Effort: Pulmonary effort is normal.     Comments: Mild dyspnea, speaks in full sentences, lungs clear Abdominal:     Palpations: Abdomen is soft.     Tenderness: There is no abdominal tenderness.  Musculoskeletal:     Cervical back: Neck supple.  Skin:    General: Skin is warm and dry.  Neurological:     Mental Status: She is alert.     ED Results / Procedures / Treatments   Labs (all labs ordered are listed, but only abnormal results are displayed) Labs Reviewed  CBC WITH DIFFERENTIAL/PLATELET - Abnormal; Notable for the following components:      Result Value   RBC 3.78 (*)    Hemoglobin 11.7 (*)    Platelets 145 (*)     All other components within normal limits  BASIC METABOLIC PANEL - Abnormal; Notable for the following components:   Glucose, Bld 123 (*)    Creatinine, Ser 1.11 (*)    Calcium 8.4 (*)    GFR, Estimated 46 (*)    All  other components within normal limits  BRAIN NATRIURETIC PEPTIDE - Abnormal; Notable for the following components:   B Natriuretic Peptide 392.3 (*)    All other components within normal limits  URINALYSIS, ROUTINE W REFLEX MICROSCOPIC - Abnormal; Notable for the following components:   Color, Urine COLORLESS (*)    Specific Gravity, Urine <1.005 (*)    Leukocytes,Ua LARGE (*)    WBC, UA >50 (*)    All other components within normal limits  I-STAT VENOUS BLOOD GAS, ED - Abnormal; Notable for the following components:   pO2, Ven 31.0 (*)    Bicarbonate 28.6 (*)    Acid-Base Excess 3.0 (*)    HCT 34.0 (*)    Hemoglobin 11.6 (*)    All other components within normal limits  TROPONIN I (HIGH SENSITIVITY) - Abnormal; Notable for the following components:   Troponin I (High Sensitivity) 68 (*)    All other components within normal limits  TROPONIN I (HIGH SENSITIVITY) - Abnormal; Notable for the following components:   Troponin I (High Sensitivity) 64 (*)    All other components within normal limits  BLOOD GAS, VENOUS    EKG EKG Interpretation  Date/Time:  Friday Nov 17 2020 11:02:52 EDT Ventricular Rate:  63 PR Interval:  172 QRS Duration: 121 QT Interval:  475 QTC Calculation: 487 R Axis:   -37 Text Interpretation: Sinus rhythm Left bundle branch block Confirmed by Madalyn Rob [id number]) on 11/17/2020 11:37:51 AM   Radiology DG Chest Portable 1 View  Result Date: 11/17/2020 CLINICAL DATA:  Fluid overload, shortness of breath EXAM: PORTABLE CHEST 1 VIEW COMPARISON:  10/31/2020 FINDINGS: Small left pleural effusion with left base atelectasis, similar to prior study. Heart is normal size. Aortic atherosclerosis. No confluent opacity on the right. Mild  hyperinflation, stable. No acute bony abnormality. IMPRESSION: Stable small left pleural effusion with left base atelectasis. Hyperinflation. No significant change. Electronically Signed   By: Rolm Baptise M.D.   On: 11/17/2020 10:14    Procedures Procedures   Medications Ordered in ED Medications  furosemide (LASIX) injection 20 mg (20 mg Intravenous Given 11/17/20 0918)  ALPRAZolam Duanne Moron) tablet 0.5 mg (0.5 mg Oral Given 11/17/20 6045)    ED Course  I have reviewed the triage vital signs and the nursing notes.  Pertinent labs & imaging results that were available during my care of the patient were reviewed by me and considered in my medical decision making (see chart for details).  Clinical Course as of 11/17/20 1324  Fri Nov 17, 2020  1001 Rechecked, symptoms improved [RD]    Clinical Course User Index [RD] Lucrezia Starch, MD   MDM Rules/Calculators/A&P                         87 year old lady with extensive past medical history including a history of heart failure, COPD presented to ER with concern for shortness of breath.  On initial exam, patient appears somewhat anxious, mildly tachypneic but not in distress.  Vital signs were stable.  Based on symptomatology, initial concern was primarily for heart failure.  Provided dose of Lasix, check BNP, chest x-ray.  Chest x-ray was actually quite clear, no acute change.  BNP at baseline.  Troponin mildly elevated but EKG did not have any acute ischemic change.  I discussed these findings with [facility] on-call for cardiology.  He recommended trending the troponin but if repeat is stable, okay for discharge and outpatient management unlikely ACS.  Her breathing improved, no hypoxia.  Repeat troponin stable, believe she is appropriate for outpatient management.  In case of mild heart failure exacerbation, recommended increasing dose of her Lasix for the next 2 days and then having close follow-up with her primary care doctor or  cardiology.  While in ER, patient developed urinary retention.  Recommended placing Foley catheter however patient very hesitant and refused to have a Foley placed but was amenable to in and out.  Discussed possibility that patient may have ongoing issues with retention without foley but patient insisted.UA concerning for pos UTI. Started on cephalexin. Discussed strict return precautions for SOB/CP/retention, etc with patient and daugther. Discharged in stable condition.   After the discussed management above, the patient was determined to be safe for discharge.  The patient was in agreement with this plan and all questions regarding their care were answered.  ED return precautions were discussed and the patient will return to the ED with any significant worsening of condition.   Final Clinical Impression(s) / ED Diagnoses Final diagnoses:  Acute cystitis without hematuria  Heart failure, unspecified HF chronicity, unspecified heart failure type ([place])    Rx / DC Orders ED Discharge Orders         Ordered    cephALEXin (KEFLEX) 500 MG capsule  3 times daily        11/17/20 1309           Lucrezia Starch, MD 11/17/20 1325

## 2020-11-18 ENCOUNTER — Emergency Department (HOSPITAL_BASED_OUTPATIENT_CLINIC_OR_DEPARTMENT_OTHER)
Admission: EM | Admit: 2020-11-18 | Discharge: 2020-11-18 | Disposition: A | Discharge status: Home / Self Care | Payer: Medicare PPO | Attending: Emergency Medicine | Admitting: Emergency Medicine

## 2020-11-18 ENCOUNTER — Other Ambulatory Visit: Payer: Self-pay

## 2020-11-18 ENCOUNTER — Encounter (HOSPITAL_BASED_OUTPATIENT_CLINIC_OR_DEPARTMENT_OTHER): Payer: Self-pay | Admitting: Emergency Medicine

## 2020-11-18 DIAGNOSIS — Z87891 Personal history of nicotine dependence: Secondary | ICD-10-CM | POA: Diagnosis not present

## 2020-11-18 DIAGNOSIS — J441 Chronic obstructive pulmonary disease with (acute) exacerbation: Secondary | ICD-10-CM | POA: Diagnosis not present

## 2020-11-18 DIAGNOSIS — Z8616 Personal history of COVID-19: Secondary | ICD-10-CM | POA: Insufficient documentation

## 2020-11-18 DIAGNOSIS — E039 Hypothyroidism, unspecified: Secondary | ICD-10-CM | POA: Insufficient documentation

## 2020-11-18 DIAGNOSIS — N183 Chronic kidney disease, stage 3 unspecified: Secondary | ICD-10-CM | POA: Diagnosis not present

## 2020-11-18 DIAGNOSIS — N390 Urinary tract infection, site not specified: Secondary | ICD-10-CM | POA: Diagnosis not present

## 2020-11-18 DIAGNOSIS — I5021 Acute systolic (congestive) heart failure: Secondary | ICD-10-CM | POA: Diagnosis not present

## 2020-11-18 DIAGNOSIS — Z79899 Other long term (current) drug therapy: Secondary | ICD-10-CM | POA: Insufficient documentation

## 2020-11-18 DIAGNOSIS — R0600 Dyspnea, unspecified: Secondary | ICD-10-CM

## 2020-11-18 DIAGNOSIS — I130 Hypertensive heart and chronic kidney disease with heart failure and stage 1 through stage 4 chronic kidney disease, or unspecified chronic kidney disease: Secondary | ICD-10-CM | POA: Diagnosis not present

## 2020-11-18 DIAGNOSIS — R0602 Shortness of breath: Secondary | ICD-10-CM | POA: Diagnosis not present

## 2020-11-18 DIAGNOSIS — R339 Retention of urine, unspecified: Secondary | ICD-10-CM

## 2020-11-18 DIAGNOSIS — Z7951 Long term (current) use of inhaled steroids: Secondary | ICD-10-CM | POA: Insufficient documentation

## 2020-11-18 NOTE — ED Notes (Signed)
Pt placed on bedside commode.  

## 2020-11-18 NOTE — ED Notes (Signed)
ED Provider at bedside. 

## 2020-11-18 NOTE — ED Triage Notes (Signed)
Pt arrives pov with c/o shob and urinary retention. Pt endorses taking 40mg  lasix, unable to urinate since 0700 today

## 2020-11-18 NOTE — ED Notes (Signed)
"  Check pulse ox while ambulating" ordered by MD. Patient had difficulty pivoting to bed at arrival due to pain in her legs. MD made aware. Patient currently in bed on RA with O2 sats of 97%.

## 2020-11-18 NOTE — ED Notes (Signed)
Patient refused foley catheter.

## 2020-11-18 NOTE — ED Notes (Signed)
Pt has no urge to urinate, she is aware we need her to empty her bladder.

## 2020-11-18 NOTE — ED Provider Notes (Signed)
[facility] EMERGENCY DEPT Provider Note   CSN: [id number] Arrival date & time: 11/18/20  1218     History Chief Complaint  Patient presents with  . Shortness of Breath    [name] is a 87 y.o. female.  HPI Patient presents with shortness of breath and difficulty urinating.  Seen yesterday in the ER for similar symptoms.  Thought to have some volume overload.  Also having difficulty urinating.  States she has not urinated since this morning.  States she took Lasix and really did not urinate after it.  Had been seen in the ER and had some urinary retention but refused Foley catheter at that time.  Patient states when this urinary retention started she began to feel more short of breath and took her Xanax.  May be feeling a little better now.  Not on oxygen at home.  Has had a few doses of Keflex for UTI diagnosed yesterday.      Past Medical History:  Diagnosis Date  . Anxiety   . Aortic arch atherosclerosis ([place]) 06/24/2014  . Atherosclerotic ulcer of aorta ([place]) 06/24/2014  . Carotid artery occlusion   . Chronic kidney disease   . COPD (chronic obstructive pulmonary disease) ([place])   . [place] DISEASE, LUMBAR 12/16/2008  . DIVERTICULOSIS, COLON 09/30/2008  . DYSPNEA 07/13/2008  . Graves disease   . History of embolic stroke [id number]   Left brain  . HYPERLIPIDEMIA 03/06/2007  . HYPERTENSION 03/06/2007  . HYPOTHYROIDISM 10/13/2007  . OSTEOARTHRITIS 03/06/2007  . Personal history of colonic polyps 09/30/2008  . Stroke ([place])    06/2014           . TOBACCO USE, QUIT 04/12/2009  . WEAKNESS 11/09/2007    Patient Active Problem List   Diagnosis Date Noted  . Acute systolic CHF (congestive heart failure) ([place]) 09/01/2020  . Acute respiratory failure ([place])   . Mild aortic stenosis 08/30/2020  . Elevated troponin 08/30/2020  . Pleural effusion on left 08/30/2020  . Pain in joint of left knee 04/10/2020  . D-dimer, elevated 01/14/2020  . Elevated brain natriuretic  peptide (BNP) level 01/14/2020  . Advice given about COVID-19 virus infection 07/21/2019  . Current chronic use of systemic steroids 07/13/2019  . Medication management 04/02/2018  . COPD exacerbation ([place]) 03/31/2018  . Hypokalemia 03/30/2018  . COPD with acute exacerbation ([place]) 03/02/2018  . SOB (shortness of breath) 01/12/2018  . CKD (chronic kidney disease), stage III 01/12/2018  . New onset left bundle branch block (LBBB) 01/12/2018  . COPD GOLD I D 01/12/2018  . Neurogenic claudication due to lumbar spinal stenosis 09/13/2017  . Symptomatic anemia   . AVM (arteriovenous malformation) of duodenum, acquired with hemorrhage   . Acute GI bleeding 10/09/2016  . COPD with emphysema ([place]) 05/10/2016  . Recurrent laryngeal neuropathy 02/01/2016  . Nausea with vomiting 12/11/2015  . Left carotid stenosis 11/21/2015  . Asymptomatic carotid artery stenosis 11/13/2015  . Impaired glucose tolerance 10/27/2015  . Solitary pulmonary nodule 10/27/2015  . Thyroid nodule 10/27/2015  . Syncope 10/04/2015  . Bradycardia with less than 60 beats per minute 10/04/2015  . History of embolic stroke [id number]  . Paresthesia of both feet 07/06/2014  . Aortic arch atherosclerosis ([place]) 06/24/2014  . Atherosclerotic ulcer of aorta ([place]) 06/24/2014  . Stroke ([place]) 06/22/2014  . Bilateral carotid artery disease ([place]) 05/18/2014  . TOBACCO USE, QUIT 04/12/2009  . [place] DISEASE, LUMBAR 12/16/2008  . DIVERTICULOSIS, COLON 09/30/2008  . Personal history of  colonic polyps 09/30/2008  . DYSPNEA 07/13/2008  . Weakness 11/09/2007  . Hypothyroidism 10/13/2007  . Mixed hyperlipidemia 03/06/2007  . Essential hypertension 03/06/2007  . Osteoarthritis 03/06/2007    Past Surgical History:  Procedure Laterality Date  . CARDIAC CATHETERIZATION    . CATARACT EXTRACTION Bilateral   . CHOLECYSTECTOMY    . ENDARTERECTOMY Left 11/13/2015   Procedure: LEFT CAROTID ARTERY ENDARTERECTOMY;  Surgeon: Conrad Orient, MD;   Location: [facility];  Service: Vascular;  Laterality: Left;  . ESOPHAGOGASTRODUODENOSCOPY (EGD) WITH PROPOFOL N/A 10/11/2016   Procedure: ESOPHAGOGASTRODUODENOSCOPY (EGD) WITH PROPOFOL;  Surgeon: Mauri Pole, MD;  Location: [facility];  Service: Endoscopy;  Laterality: N/A;  . KNEE SURGERY    . PATCH ANGIOPLASTY Left 11/13/2015   Procedure: WITH 1CM X 6CM  XENOSURE BIOLOGIC PATCH ANGIOPLASTY;  Surgeon: Conrad Lyman, MD;  Location: [facility];  Service: Vascular;  Laterality: Left;  . TEE WITHOUT CARDIOVERSION N/A 06/24/2014   Procedure: TRANSESOPHAGEAL ECHOCARDIOGRAM (TEE);  Surgeon: Sanda Klein, MD;  Location: [facility] ENDOSCOPY;  Service: Cardiovascular;  Laterality: N/A;     OB History   No obstetric history on file.     Family History  Problem Relation Age of Onset  . Stomach cancer Maternal Grandmother   . Colon cancer Neg Hx     Social History   Tobacco Use  . Smoking status: Former Smoker    Packs/day: 1.50    Years: 29.00    Pack years: 43.50    Types: Cigarettes    Start date: 07/08/1949    Quit date: 07/08/1978    Years since quitting: 42.3  . Smokeless tobacco: Never Used  Vaping Use  . Vaping Use: Never used  Substance Use Topics  . Alcohol use: Yes    Alcohol/week: 1.0 standard drink    Types: 1 Glasses of wine per week    Comment: "red wine"- some nights  . Drug use: No    Home Medications Prior to Admission medications   Medication Sig Start Date End Date Taking? Authorizing Provider  albuterol (PROAIR HFA) 108 (90 Base) MCG/ACT inhaler Inhale 2 puffs into the lungs every 6 (six) hours as needed for wheezing or shortness of breath. 11/13/17   Volanda Napoleon, PA-C  ALPRAZolam Duanne Moron) 0.5 MG tablet Take 0.5-1 tablets (0.25-0.5 mg total) by mouth 2 (two) times daily as needed for anxiety. 09/05/20   Nafziger, Tommi Rumps, NP  amLODipine (NORVASC) 5 MG tablet Take 1 tablet (5 mg total) by mouth daily. 10/12/20   Josue Hector, MD  atorvastatin (LIPITOR) 80 MG tablet TAKE 1  TABLET BY MOUTH EVERY DAY AT 6PM Patient taking differently: Take 80 mg by mouth daily. 04/20/20   Nafziger, Tommi Rumps, NP  cephALEXin (KEFLEX) 500 MG capsule Take 1 capsule (500 mg total) by mouth 3 (three) times daily for 5 days. 11/17/20 11/22/20  Lucrezia Starch, MD  furosemide (LASIX) 20 MG tablet Take 1 tablet (20 mg total) by mouth daily. 09/05/20   Regalado, Belkys A, MD  hydrALAZINE (APRESOLINE) 50 MG tablet Take 1 tablet (50 mg total) by mouth 3 (three) times daily. 09/25/20   Josue Hector, MD  ipratropium-albuterol (DUONEB) 0.5-2.5 (3) MG/3ML SOLN Take 3 mLs by nebulization every 6 (six) hours as needed (copd). 01/16/20   Danford, Suann Larry, MD  isosorbide mononitrate (IMDUR) 30 MG 24 hr tablet Take 1 tablet (30 mg total) by mouth daily. 09/04/20   Regalado, Belkys A, MD  latanoprost (XALATAN) 0.005 % ophthalmic solution  Place 1 drop into both eyes at bedtime.  04/02/19   [provider]  levothyroxine (SYNTHROID) 75 MCG tablet Take 1 tablet (75 mcg total) by mouth daily before breakfast. 07/04/20   Nafziger, Tommi Rumps, NP  pantoprazole (PROTONIX) 40 MG tablet Take 1 tablet (40 mg total) by mouth daily. 09/04/20   Regalado, Belkys A, MD  potassium chloride SA (KLOR-CON) 20 MEQ tablet Take 1 tablet (20 mEq total) by mouth daily. 09/04/20   Regalado, Belkys A, MD  predniSONE (DELTASONE) 5 MG tablet Take 5-10 mg by mouth daily with breakfast.    [provider]  Spacer/Aero-Holding Chambers (AEROCHAMBER MV) inhaler Use as instructed Patient taking differently: 1 each by Other route as directed. 11/26/17   Magdalen Spatz, NP  TRELEGY ELLIPTA 100-62.5-25 MCG/INH AEPB INHALE 1 PUFF BY MOUTH EVERY DAY Patient taking differently: Inhale 1 puff into the lungs daily. 08/09/20   Marshell Garfinkel, MD    Allergies    Catapres [clonidine hcl], Clonidine, Lisinopril, Labetalol, and Labetalol hcl  Review of Systems   Review of Systems  Constitutional: Negative for appetite change.   Respiratory: Positive for shortness of breath.   Gastrointestinal: Negative for abdominal pain.  Genitourinary: Positive for difficulty urinating.  Musculoskeletal: Negative for back pain.  Skin: Negative for rash.  Neurological: Negative for weakness.  Psychiatric/Behavioral: Negative for confusion.    Physical Exam Updated Vital Signs BP (!) 174/67 (BP Location: Left Arm)   Pulse 71   Temp 98.3 F (36.8 C) (Oral)   Resp 20   Ht 5\' 6"  (1.676 m)   Wt 56.7 kg   SpO2 99%   BMI 20.18 kg/m   Physical Exam Vitals reviewed.  Constitutional:      Appearance: She is well-developed.  HENT:     Head: Atraumatic.  Cardiovascular:     Rate and Rhythm: Normal rate and regular rhythm.  Pulmonary:     Comments: Mildly harsh breath sounds without definite focal rales or rhonchi. Chest:     Chest wall: No mass.  Abdominal:     Palpations: There is no mass.  Musculoskeletal:     Right lower leg: No edema.  Skin:    General: Skin is warm.     Capillary Refill: Capillary refill takes less than 2 seconds.  Neurological:     Mental Status: She is alert and oriented to person, place, and time.     ED Results / Procedures / Treatments   Labs (all labs ordered are listed, but only abnormal results are displayed) Labs Reviewed - No data to display  EKG None  Radiology DG Chest Portable 1 View  Result Date: 11/17/2020 CLINICAL DATA:  Fluid overload, shortness of breath EXAM: PORTABLE CHEST 1 VIEW COMPARISON:  10/31/2020 FINDINGS: Small left pleural effusion with left base atelectasis, similar to prior study. Heart is normal size. Aortic atherosclerosis. No confluent opacity on the right. Mild hyperinflation, stable. No acute bony abnormality. IMPRESSION: Stable small left pleural effusion with left base atelectasis. Hyperinflation. No significant change. Electronically Signed   By: Rolm Baptise M.D.   On: 11/17/2020 10:14    Procedures Procedures   Medications Ordered in  ED Medications - No data to display  ED Course  I have reviewed the triage vital signs and the nursing notes.  Pertinent labs & imaging results that were available during my care of the patient were reviewed by me and considered in my medical decision making (see chart for details).    MDM Rules/Calculators/A&P  Patient presents with urinary retention.  Initial bladder scan showed 300.  Unable to urinate.  Had urinary retention yesterday.  Order had been put in for Foley catheter but patient later was able to urinate on her own.  She is very hesitant to get a catheter and I think the fact she was able to void down to almost nothing left in the bladder it is safe for discharge without the catheter at this time although there is possibility of it happening again.  If the retention potentially is caused by UTI more antibiotics would hopefully help. Also had shortness of breath.  However not hypoxic.  Reviewed lab work and x-ray from yesterday.  BNP at baseline as well as mildly elevated troponin.  Patient felt much better after being able to urinate.  Respiratory status improved.  There may be an anxiety component on this and patient takes benzos when she gets short of breath.  Think she is stable to discharge from that aspect.  Do not need further work-up for that at this time.  Has diuresed some of the Lasix from yesterday.  Discharge home. Final Clinical Impression(s) / ED Diagnoses Final diagnoses:  Dyspnea, unspecified type  Urinary retention  Urinary tract infection without hematuria, site unspecified    Rx / DC Orders ED Discharge Orders    None       Davonna Belling, MD 11/18/20 1557

## 2020-11-18 NOTE — ED Notes (Signed)
Pt okayed to have food drink by EDP--patient repositioned--family at bedside

## 2020-11-18 NOTE — ED Notes (Signed)
Pt assisted to [facility], able to void 400 cc, UA and culture obtained and sent to lab. Pt had 55 ML post void with bladder scan.

## 2020-11-18 NOTE — Discharge Instructions (Signed)
Continue the antibiotics for the urinary tract infection.  Return if you have difficulty urinating.  Follow-up with urology

## 2020-11-18 NOTE — ED Notes (Signed)
Patient arrives with trouble urinating and is short of breath at this time. Patient placed on 2 L [place] for comfort (O2 sats 97%). Patient has a hx of COPD and takes alb and duo PRN; also has trelegy for maintenance. BBS FC/dim. Patient CXR showed pleural eff and atelectasis on 11/17/20. Patient endorses she took 2 lasix pills today.

## 2020-11-21 ENCOUNTER — Encounter: Payer: Self-pay | Admitting: Adult Health

## 2020-11-21 ENCOUNTER — Emergency Department (HOSPITAL_BASED_OUTPATIENT_CLINIC_OR_DEPARTMENT_OTHER)
Admission: EM | Admit: 2020-11-21 | Discharge: 2020-11-21 | Disposition: A | Discharge status: Home / Self Care | Payer: Medicare PPO | Attending: Emergency Medicine | Admitting: Emergency Medicine

## 2020-11-21 ENCOUNTER — Other Ambulatory Visit: Payer: Self-pay

## 2020-11-21 ENCOUNTER — Encounter (HOSPITAL_BASED_OUTPATIENT_CLINIC_OR_DEPARTMENT_OTHER): Payer: Self-pay | Admitting: *Deleted

## 2020-11-21 ENCOUNTER — Ambulatory Visit: Payer: Medicare PPO | Admitting: Adult Health

## 2020-11-21 ENCOUNTER — Emergency Department (HOSPITAL_BASED_OUTPATIENT_CLINIC_OR_DEPARTMENT_OTHER): Payer: Medicare PPO | Admitting: Radiology

## 2020-11-21 VITALS — BP 160/90 | Temp 97.7°F | Ht 66.0 in | Wt 123.0 lb

## 2020-11-21 DIAGNOSIS — I130 Hypertensive heart and chronic kidney disease with heart failure and stage 1 through stage 4 chronic kidney disease, or unspecified chronic kidney disease: Secondary | ICD-10-CM | POA: Diagnosis not present

## 2020-11-21 DIAGNOSIS — Z8673 Personal history of transient ischemic attack (TIA), and cerebral infarction without residual deficits: Secondary | ICD-10-CM | POA: Insufficient documentation

## 2020-11-21 DIAGNOSIS — N3000 Acute cystitis without hematuria: Secondary | ICD-10-CM

## 2020-11-21 DIAGNOSIS — R339 Retention of urine, unspecified: Secondary | ICD-10-CM

## 2020-11-21 DIAGNOSIS — J441 Chronic obstructive pulmonary disease with (acute) exacerbation: Secondary | ICD-10-CM | POA: Insufficient documentation

## 2020-11-21 DIAGNOSIS — R531 Weakness: Secondary | ICD-10-CM | POA: Diagnosis not present

## 2020-11-21 DIAGNOSIS — R0600 Dyspnea, unspecified: Secondary | ICD-10-CM | POA: Diagnosis not present

## 2020-11-21 DIAGNOSIS — Z7951 Long term (current) use of inhaled steroids: Secondary | ICD-10-CM | POA: Diagnosis not present

## 2020-11-21 DIAGNOSIS — Z8616 Personal history of COVID-19: Secondary | ICD-10-CM | POA: Insufficient documentation

## 2020-11-21 DIAGNOSIS — J449 Chronic obstructive pulmonary disease, unspecified: Secondary | ICD-10-CM

## 2020-11-21 DIAGNOSIS — N183 Chronic kidney disease, stage 3 unspecified: Secondary | ICD-10-CM | POA: Insufficient documentation

## 2020-11-21 DIAGNOSIS — R0602 Shortness of breath: Secondary | ICD-10-CM | POA: Diagnosis not present

## 2020-11-21 DIAGNOSIS — D696 Thrombocytopenia, unspecified: Secondary | ICD-10-CM | POA: Diagnosis not present

## 2020-11-21 DIAGNOSIS — D649 Anemia, unspecified: Secondary | ICD-10-CM | POA: Diagnosis not present

## 2020-11-21 DIAGNOSIS — Z79899 Other long term (current) drug therapy: Secondary | ICD-10-CM | POA: Diagnosis not present

## 2020-11-21 DIAGNOSIS — J90 Pleural effusion, not elsewhere classified: Secondary | ICD-10-CM | POA: Diagnosis not present

## 2020-11-21 DIAGNOSIS — I5022 Chronic systolic (congestive) heart failure: Secondary | ICD-10-CM | POA: Insufficient documentation

## 2020-11-21 DIAGNOSIS — I110 Hypertensive heart disease with heart failure: Secondary | ICD-10-CM | POA: Diagnosis not present

## 2020-11-21 DIAGNOSIS — E039 Hypothyroidism, unspecified: Secondary | ICD-10-CM | POA: Diagnosis not present

## 2020-11-21 DIAGNOSIS — Z87891 Personal history of nicotine dependence: Secondary | ICD-10-CM | POA: Diagnosis not present

## 2020-11-21 DIAGNOSIS — I10 Essential (primary) hypertension: Secondary | ICD-10-CM

## 2020-11-21 LAB — TROPONIN I (HIGH SENSITIVITY)
Troponin I (High Sensitivity): 44 ng/L — ABNORMAL HIGH (ref <18)
Troponin I (High Sensitivity): 44 ng/L — ABNORMAL HIGH (ref <18)

## 2020-11-21 LAB — BASIC METABOLIC PANEL
Anion gap: 10 (ref 5–15)
BUN: 22 mg/dL (ref 8–23)
CO2: 27 mmol/L (ref 22–32)
Calcium: 8.6 mg/dL — ABNORMAL LOW (ref 8.9–10.3)
Chloride: 101 mmol/L (ref 98–111)
Creatinine, Ser: 1.14 mg/dL — ABNORMAL HIGH (ref 0.44–1.00)
GFR, Estimated: 45 mL/min — ABNORMAL LOW (ref >60)
Glucose, Bld: 114 mg/dL — ABNORMAL HIGH (ref 70–99)
Potassium: 3.6 mmol/L (ref 3.5–5.1)
Sodium: 138 mmol/L (ref 135–145)

## 2020-11-21 LAB — CBC WITH DIFFERENTIAL/PLATELET
Abs Immature Granulocytes: 0.01 10*3/uL (ref 0.00–0.07)
Basophils Absolute: 0 10*3/uL (ref 0.0–0.1)
Basophils Relative: 1 %
Eosinophils Absolute: 0 10*3/uL (ref 0.0–0.5)
Eosinophils Relative: 1 %
HCT: 26.5 % — ABNORMAL LOW (ref 36.0–46.0)
Hemoglobin: 8.4 g/dL — ABNORMAL LOW (ref 12.0–15.0)
Immature Granulocytes: 0 %
Lymphocytes Relative: 13 %
Lymphs Abs: 0.5 10*3/uL — ABNORMAL LOW (ref 0.7–4.0)
MCH: 30.3 pg (ref 26.0–34.0)
MCHC: 31.7 g/dL (ref 30.0–36.0)
MCV: 95.7 fL (ref 80.0–100.0)
Monocytes Absolute: 0.6 10*3/uL (ref 0.1–1.0)
Monocytes Relative: 13 %
Neutro Abs: 3 10*3/uL (ref 1.7–7.7)
Neutrophils Relative %: 72 %
Platelets: 110 10*3/uL — ABNORMAL LOW (ref 150–400)
RBC: 2.77 MIL/uL — ABNORMAL LOW (ref 3.87–5.11)
RDW: 14.1 % (ref 11.5–15.5)
WBC: 4.1 10*3/uL (ref 4.0–10.5)
nRBC: 0 % (ref 0.0–0.2)

## 2020-11-21 LAB — OCCULT BLOOD X 1 CARD TO LAB, STOOL: Fecal Occult Bld: NEGATIVE

## 2020-11-21 LAB — BRAIN NATRIURETIC PEPTIDE: B Natriuretic Peptide: 435.1 pg/mL — ABNORMAL HIGH (ref 0.0–100.0)

## 2020-11-21 NOTE — Patient Instructions (Signed)
It was great seeing you today   I am going to have you add Norvasc 5 mg back to your regimen   Please monitor your input and outputs at home.

## 2020-11-21 NOTE — ED Notes (Signed)
To x-ray

## 2020-11-21 NOTE — ED Triage Notes (Signed)
Pt came in for shortness of breath and difficulty urinating.

## 2020-11-21 NOTE — Progress Notes (Signed)
Subjective:    Patient ID: [name], female    DOB: March 25, 1925, 87 y.o.   MRN: [id number]  HPI 87 year old female who  has a past medical history of Anxiety, Aortic arch atherosclerosis ([place]) (06/24/2014), Atherosclerotic ulcer of aorta ([place]) (06/24/2014), Carotid artery occlusion, Chronic kidney disease, COPD (chronic obstructive pulmonary disease) ([place]), DISC DISEASE, LUMBAR (12/16/2008), DIVERTICULOSIS, COLON (09/30/2008), DYSPNEA (07/13/2008), Graves disease, History of embolic stroke (9/37/9024), HYPERLIPIDEMIA (03/06/2007), HYPERTENSION (03/06/2007), HYPOTHYROIDISM (10/13/2007), OSTEOARTHRITIS (03/06/2007), Personal history of colonic polyps (09/30/2008), Stroke ([place]), TOBACCO USE, QUIT (04/12/2009), and WEAKNESS (11/09/2007).  She presents to the office today with her daughter after being seen in the ER on 11/18/2020.  She presented with shortness of breath and difficulty urinating.  She was seen in the ER the day before for similar symptoms.  She was thought to have some volume overload and also having difficulty urinating.  She had taken her Lasix and really did not urinate afterwards.  During her first ER visit she refused Foley catheter at that time.[name] Kitchen  She was placed on Keflex for suspected UTI on 11/18/2019 ( no culture was done)  And during this ER visit her initial bladder scan showed 300 mL.  She was unable to urinate, but was able to urinate on her own before Foley catheter was placed.  Thought that the retention was possibly caused by UTI and to continue her antibiotics.  Her BNP was at baseline and x-ray was unremarkable.  After she was able to urinate  and was discharged due to feeling better and her respiratory status improved.  Today she reports that she is voiding at home, but not as much as she would like, reports voiding twice today.  Shortness of breath seems to be back to baseline.  Denies any symptoms of a urinary tract infection.  Blood pressure is elevated, not been taking her  Norvasc.  It looks like there is some confusion on who is managing her hypertension, prescriptions are written by cardiology but per note on 09/25/2020, blood pressure management is post to be done by her PCP, so we will start taking over this for her.    Review of Systems See HPI   Past Medical History:  Diagnosis Date  . Anxiety   . Aortic arch atherosclerosis ([place]) 06/24/2014  . Atherosclerotic ulcer of aorta ([place]) 06/24/2014  . Carotid artery occlusion   . Chronic kidney disease   . COPD (chronic obstructive pulmonary disease) ([place])   . [place] DISEASE, LUMBAR 12/16/2008  . DIVERTICULOSIS, COLON 09/30/2008  . DYSPNEA 07/13/2008  . Graves disease   . History of embolic stroke 0/97/3532   Left brain  . HYPERLIPIDEMIA 03/06/2007  . HYPERTENSION 03/06/2007  . HYPOTHYROIDISM 10/13/2007  . OSTEOARTHRITIS 03/06/2007  . Personal history of colonic polyps 09/30/2008  . Stroke ([place])    06/2014           . TOBACCO USE, QUIT 04/12/2009  . WEAKNESS 11/09/2007    Social History   Socioeconomic History  . Marital status: Divorced    Spouse name: Not on file  . Number of children: 4  . Years of education: college  . Highest education level: Not on file  Occupational History  . Occupation: retired  Tobacco Use  . Smoking status: Former Smoker    Packs/day: 1.50    Years: 29.00    Pack years: 43.50    Types: Cigarettes    Start date: 07/08/1949    Quit date: 07/08/1978  Years since quitting: 42.4  . Smokeless tobacco: Never Used  Vaping Use  . Vaping Use: Never used  Substance and Sexual Activity  . Alcohol use: Yes    Alcohol/week: 1.0 standard drink    Types: 1 Glasses of wine per week    Comment: "red wine"- some nights  . Drug use: No  . Sexual activity: Not on file  Other Topics Concern  . Not on file  Social History Narrative      Patient is right handed.   Patient drinks 1 cup caffeine daily.   Social Determinants of Health   Financial Resource Strain: Not on file  Food  Insecurity: Not on file  Transportation Needs: Not on file  Physical Activity: Not on file  Stress: Not on file  Social Connections: Not on file  Intimate Partner Violence: Not on file    Past Surgical History:  Procedure Laterality Date  . CARDIAC CATHETERIZATION    . CATARACT EXTRACTION Bilateral   . CHOLECYSTECTOMY    . ENDARTERECTOMY Left 11/13/2015   Procedure: LEFT CAROTID ARTERY ENDARTERECTOMY;  Surgeon: Conrad Rigby, MD;  Location: [place];  Service: Vascular;  Laterality: Left;  . ESOPHAGOGASTRODUODENOSCOPY (EGD) WITH PROPOFOL N/A 10/11/2016   Procedure: ESOPHAGOGASTRODUODENOSCOPY (EGD) WITH PROPOFOL;  Surgeon: Mauri Pole, MD;  Location: [facility];  Service: Endoscopy;  Laterality: N/A;  . KNEE SURGERY    . PATCH ANGIOPLASTY Left 11/13/2015   Procedure: WITH 1CM X 6CM  XENOSURE BIOLOGIC PATCH ANGIOPLASTY;  Surgeon: Conrad Nebo, MD;  Location: [place];  Service: Vascular;  Laterality: Left;  . TEE WITHOUT CARDIOVERSION N/A 06/24/2014   Procedure: TRANSESOPHAGEAL ECHOCARDIOGRAM (TEE);  Surgeon: Sanda Klein, MD;  [facility];  Service: Cardiovascular;  Laterality: N/A;    Family History  Problem Relation Age of Onset  . Stomach cancer Maternal Grandmother   . Colon cancer Neg Hx     Allergies  Allergen Reactions  . Catapres [Clonidine Hcl] Other (See Comments)    Made pt feel horrible, shaky, weak and nausea  . Clonidine Other (See Comments)    Made pt feel horrible, shaky, weak and nausea  . Lisinopril Anaphylaxis    Tongue swelling  . Labetalol Other (See Comments)    Caused bradycardia and syncope  . Labetalol Hcl Other (See Comments)    Caused bradycardia and syncope    Current Outpatient Medications on File Prior to Visit  Medication Sig Dispense Refill  . albuterol (PROAIR HFA) 108 (90 Base) MCG/ACT inhaler Inhale 2 puffs into the lungs every 6 (six) hours as needed for wheezing or shortness of breath. 8.5 Inhaler 5  . ALPRAZolam (XANAX) 0.5  MG tablet Take 0.5-1 tablets (0.25-0.5 mg total) by mouth 2 (two) times daily as needed for anxiety. 30 tablet 1  . amLODipine (NORVASC) 5 MG tablet Take 1 tablet (5 mg total) by mouth daily. 90 tablet 0  . atorvastatin (LIPITOR) 80 MG tablet TAKE 1 TABLET BY MOUTH EVERY DAY AT 6PM (Patient taking differently: Take 80 mg by mouth daily.) 90 tablet 1  . furosemide (LASIX) 20 MG tablet Take 1 tablet (20 mg total) by mouth daily. 30 tablet 3  . hydrALAZINE (APRESOLINE) 50 MG tablet Take 1 tablet (50 mg total) by mouth 3 (three) times daily. 270 tablet 3  . ipratropium-albuterol (DUONEB) 0.5-2.5 (3) MG/3ML SOLN Take 3 mLs by nebulization every 6 (six) hours as needed (copd). 360 mL 11  . isosorbide mononitrate (IMDUR) 30 MG 24 hr tablet  Take 1 tablet (30 mg total) by mouth daily. 30 tablet 3  . latanoprost (XALATAN) 0.005 % ophthalmic solution Place 1 drop into both eyes at bedtime.     [name] Kitchen levothyroxine (SYNTHROID) 75 MCG tablet Take 1 tablet (75 mcg total) by mouth daily before breakfast. 90 tablet 3  . pantoprazole (PROTONIX) 40 MG tablet Take 1 tablet (40 mg total) by mouth daily. 30 tablet 2  . potassium chloride SA (KLOR-CON) 20 MEQ tablet Take 1 tablet (20 mEq total) by mouth daily. 30 tablet 3  . predniSONE (DELTASONE) 5 MG tablet Take 5-10 mg by mouth daily with breakfast.    . Spacer/Aero-Holding Chambers (AEROCHAMBER MV) inhaler Use as instructed (Patient taking differently: 1 each by Other route as directed.) 1 each 0  . TRELEGY ELLIPTA 100-62.5-25 MCG/INH AEPB INHALE 1 PUFF BY MOUTH EVERY DAY (Patient taking differently: Inhale 1 puff into the lungs daily.) 60 each 3   No current facility-administered medications on file prior to visit.    BP (!) 160/90   Temp 97.7 F (36.5 C) (Oral)   Ht 5\' 6"  (1.676 m)   Wt 123 lb (55.8 kg)   BMI 19.85 kg/m         Objective:   Physical Exam Vitals and nursing note reviewed.  Constitutional:      Appearance: Normal appearance.   Cardiovascular:     Rate and Rhythm: Normal rate and regular rhythm.     Pulses: Normal pulses.     Heart sounds: Normal heart sounds.  Pulmonary:     Effort: Pulmonary effort is normal.     Breath sounds: Normal breath sounds.  Musculoskeletal:        General: Normal range of motion.  Skin:    General: Skin is warm and dry.     Capillary Refill: Capillary refill takes less than 2 seconds.  Neurological:     Mental Status: She is alert.  Psychiatric:        Mood and Affect: Mood normal.        Behavior: Behavior normal.        Thought Content: Thought content normal.        Judgment: Judgment normal.       Assessment & Plan:  1. Essential hypertension - will have her start Norvasc 5 mg daily.   2. Acute cystitis without hematuria -She and her family would like to have a repeat urinalysis and urine culture done next week after she finishes her antibiotics to make sure her urinary tract infection is cleared - Culture, Urine; Future - Urinalysis; Future  3. Urinary retention  - Culture, Urine; Future - Urinalysis; Future  4. Dyspnea, unspecified type - Back to baseline. Likely anxiety component, has xanax that she takes PRN for this    [name], NP

## 2020-11-21 NOTE — ED Provider Notes (Signed)
[facility] EMERGENCY DEPT Provider Note   CSN: [id number] Arrival date & time: 11/21/20  1744     History Chief Complaint  Patient presents with  . Shortness of Breath    [name] is a 87 y.o. female.  [name] presents with shortness of breath.  She has a history of COPD, not on home oxygen, and congestive heart failure.  She has been to the emergency department multiple times in the past week for similar symptoms and has had reassuring evaluations.  She saw her primary care doctor today.  She was doing fine until this afternoon when she developed some shortness of breath.  She lives alone, but her family checks on her and assists her.  Her daughter is open to case management consultation, palliative care consultation, and discussion of how to get the patient more help at home.  The patient states her priority is remaining as independent as possible, but this has become increasingly more difficult as she is dyspneic and weak with any short periods of ambulation.  The history is provided by the patient and a relative (daughter).  Shortness of Breath Severity:  Moderate Onset quality:  Sudden Timing:  Constant Progression:  Unchanged Chronicity:  Chronic Context comment:  At rest Relieved by:  Nothing Worsened by:  Exertion Ineffective treatments: xanax. Associated symptoms: no abdominal pain, no chest pain, no cough, no ear pain, no fever, no rash, no sore throat and no vomiting        Past Medical History:  Diagnosis Date  . Anxiety   . Aortic arch atherosclerosis ([place]) 06/24/2014  . Atherosclerotic ulcer of aorta ([place]) 06/24/2014  . Carotid artery occlusion   . Chronic kidney disease   . COPD (chronic obstructive pulmonary disease) ([place])   . [place] DISEASE, LUMBAR 12/16/2008  . DIVERTICULOSIS, COLON 09/30/2008  . DYSPNEA 07/13/2008  . Graves disease   . History of embolic stroke [id number]   Left brain  . HYPERLIPIDEMIA 03/06/2007  . HYPERTENSION  03/06/2007  . HYPOTHYROIDISM 10/13/2007  . OSTEOARTHRITIS 03/06/2007  . Personal history of colonic polyps 09/30/2008  . Stroke ([place])    06/2014           . TOBACCO USE, QUIT 04/12/2009  . WEAKNESS 11/09/2007    Patient Active Problem List   Diagnosis Date Noted  . Acute systolic CHF (congestive heart failure) ([place]) 09/01/2020  . Acute respiratory failure ([place])   . Mild aortic stenosis 08/30/2020  . Elevated troponin 08/30/2020  . Pleural effusion on left 08/30/2020  . Pain in joint of left knee 04/10/2020  . D-dimer, elevated 01/14/2020  . Elevated brain natriuretic peptide (BNP) level 01/14/2020  . Advice given about COVID-19 virus infection 07/21/2019  . Current chronic use of systemic steroids 07/13/2019  . Medication management 04/02/2018  . COPD exacerbation ([place]) 03/31/2018  . Hypokalemia 03/30/2018  . COPD with acute exacerbation ([place]) 03/02/2018  . SOB (shortness of breath) 01/12/2018  . CKD (chronic kidney disease), stage III 01/12/2018  . New onset left bundle branch block (LBBB) 01/12/2018  . COPD GOLD I D 01/12/2018  . Neurogenic claudication due to lumbar spinal stenosis 09/13/2017  . Symptomatic anemia   . AVM (arteriovenous malformation) of duodenum, acquired with hemorrhage   . Acute GI bleeding 10/09/2016  . COPD with emphysema ([place]) 05/10/2016  . Recurrent laryngeal neuropathy 02/01/2016  . Nausea with vomiting 12/11/2015  . Left carotid stenosis 11/21/2015  . Asymptomatic carotid artery stenosis 11/13/2015  . Impaired glucose  tolerance 10/27/2015  . Solitary pulmonary nodule 10/27/2015  . Thyroid nodule 10/27/2015  . Syncope 10/04/2015  . Bradycardia with less than 60 beats per minute 10/04/2015  . History of embolic stroke 01/60/1093  . Paresthesia of both feet 07/06/2014  . Aortic arch atherosclerosis ([place]) 06/24/2014  . Atherosclerotic ulcer of aorta ([place]) 06/24/2014  . Stroke ([place]) 06/22/2014  . Bilateral carotid artery disease ([place]) 05/18/2014  .  TOBACCO USE, QUIT 04/12/2009  . [place] DISEASE, LUMBAR 12/16/2008  . DIVERTICULOSIS, COLON 09/30/2008  . Personal history of colonic polyps 09/30/2008  . DYSPNEA 07/13/2008  . Weakness 11/09/2007  . Hypothyroidism 10/13/2007  . Mixed hyperlipidemia 03/06/2007  . Essential hypertension 03/06/2007  . Osteoarthritis 03/06/2007    Past Surgical History:  Procedure Laterality Date  . CARDIAC CATHETERIZATION    . CATARACT EXTRACTION Bilateral   . CHOLECYSTECTOMY    . ENDARTERECTOMY Left 11/13/2015   Procedure: LEFT CAROTID ARTERY ENDARTERECTOMY;  Surgeon: Conrad Harvey, MD;  Location: [facility];  Service: Vascular;  Laterality: Left;  . ESOPHAGOGASTRODUODENOSCOPY (EGD) WITH PROPOFOL N/A 10/11/2016   Procedure: ESOPHAGOGASTRODUODENOSCOPY (EGD) WITH PROPOFOL;  Surgeon: Mauri Pole, MD;  Location: [facility];  Service: Endoscopy;  Laterality: N/A;  . KNEE SURGERY    . PATCH ANGIOPLASTY Left 11/13/2015   Procedure: WITH 1CM X 6CM  XENOSURE BIOLOGIC PATCH ANGIOPLASTY;  Surgeon: Conrad Hopewell, MD;  Location: [facility];  Service: Vascular;  Laterality: Left;  . TEE WITHOUT CARDIOVERSION N/A 06/24/2014   Procedure: TRANSESOPHAGEAL ECHOCARDIOGRAM (TEE);  Surgeon: Sanda Klein, MD;  Location[facility]COPY;  Service: Cardiovascular;  Laterality: N/A;     OB History   No obstetric history on file.     Family History  Problem Relation Age of Onset  . Stomach cancer Maternal Grandmother   . Colon cancer Neg Hx     Social History   Tobacco Use  . Smoking status: Former Smoker    Packs/day: 1.50    Years: 29.00    Pack years: 43.50    Types: Cigarettes    Start date: 07/08/1949    Quit date: 07/08/1978    Years since quitting: 42.4  . Smokeless tobacco: Never Used  Vaping Use  . Vaping Use: Never used  Substance Use Topics  . Alcohol use: Yes    Alcohol/week: 1.0 standard drink    Types: 1 Glasses of wine per week    Comment: "red wine"- some nights  . Drug use: No    Home  Medications Prior to Admission medications   Medication Sig Start Date End Date Taking? Authorizing Provider  albuterol (PROAIR HFA) 108 (90 Base) MCG/ACT inhaler Inhale 2 puffs into the lungs every 6 (six) hours as needed for wheezing or shortness of breath. 11/13/17  Yes Volanda Napoleon, PA-C  ALPRAZolam (XANAX) 0.5 MG tablet Take 0.5-1 tablets (0.25-0.5 mg total) by mouth 2 (two) times daily as needed for anxiety. 09/05/20  Yes Nafziger, Tommi Rumps, NP  amLODipine (NORVASC) 5 MG tablet Take 1 tablet (5 mg total) by mouth daily. 10/12/20  Yes Josue Hector, MD  atorvastatin (LIPITOR) 80 MG tablet TAKE 1 TABLET BY MOUTH EVERY DAY AT 6PM Patient taking differently: Take 80 mg by mouth daily. 04/20/20  Yes Nafziger, Tommi Rumps, NP  cephALEXin (KEFLEX) 500 MG capsule Take 1 capsule (500 mg total) by mouth 3 (three) times daily for 5 days. 11/17/20 11/22/20 Yes Dykstra, Ellwood Dense, MD  furosemide (LASIX) 20 MG tablet Take 1 tablet (20 mg total) by  mouth daily. 09/05/20  Yes Regalado, Belkys A, MD  hydrALAZINE (APRESOLINE) 50 MG tablet Take 1 tablet (50 mg total) by mouth 3 (three) times daily. 09/25/20  Yes Josue Hector, MD  isosorbide mononitrate (IMDUR) 30 MG 24 hr tablet Take 1 tablet (30 mg total) by mouth daily. 09/04/20  Yes Regalado, Belkys A, MD  latanoprost (XALATAN) 0.005 % ophthalmic solution Place 1 drop into both eyes at bedtime.  04/02/19  Yes [provider]  levothyroxine (SYNTHROID) 75 MCG tablet Take 1 tablet (75 mcg total) by mouth daily before breakfast. 07/04/20  Yes Nafziger, Tommi Rumps, NP  pantoprazole (PROTONIX) 40 MG tablet Take 1 tablet (40 mg total) by mouth daily. 09/04/20  Yes Regalado, Belkys A, MD  potassium chloride SA (KLOR-CON) 20 MEQ tablet Take 1 tablet (20 mEq total) by mouth daily. 09/04/20  Yes Regalado, Belkys A, MD  predniSONE (DELTASONE) 5 MG tablet Take 5-10 mg by mouth daily with breakfast.   Yes [provider]  TRELEGY ELLIPTA 100-62.5-25 MCG/INH AEPB INHALE 1 PUFF  BY MOUTH EVERY DAY Patient taking differently: Inhale 1 puff into the lungs daily. 08/09/20  Yes Mannam, Praveen, MD  ipratropium-albuterol (DUONEB) 0.5-2.5 (3) MG/3ML SOLN Take 3 mLs by nebulization every 6 (six) hours as needed (copd). 01/16/20   Danford, Suann Larry, MD  Spacer/Aero-Holding Chambers (AEROCHAMBER MV) inhaler Use as instructed Patient taking differently: 1 each by Other route as directed. 11/26/17   Magdalen Spatz, NP    Allergies    Catapres [clonidine hcl], Clonidine, Lisinopril, Labetalol, and Labetalol hcl  Review of Systems   Review of Systems  Constitutional: Negative for chills and fever.  HENT: Negative for ear pain and sore throat.   Eyes: Negative for pain and visual disturbance.  Respiratory: Positive for shortness of breath. Negative for cough.   Cardiovascular: Negative for chest pain and palpitations.  Gastrointestinal: Negative for abdominal pain and vomiting.  Genitourinary: Negative for dysuria and hematuria.  Musculoskeletal: Negative for arthralgias and back pain.  Skin: Negative for color change and rash.  Neurological: Negative for seizures and syncope.  All other systems reviewed and are negative.   Physical Exam Updated Vital Signs BP (!) 145/80 (BP Location: Left Arm)   Pulse 74   Temp 97.8 F (36.6 C) (Oral)   Resp 20   Ht 5\' 6"  (1.676 m)   Wt 55.8 kg   SpO2 96%   BMI 19.85 kg/m   Physical Exam Vitals and nursing note reviewed.  Constitutional:      General: She is not in acute distress.    Appearance: She is well-developed.  HENT:     Head: Normocephalic and atraumatic.  Eyes:     Conjunctiva/sclera: Conjunctivae normal.  Cardiovascular:     Rate and Rhythm: Normal rate and regular rhythm.     Heart sounds: No murmur heard.   Pulmonary:     Effort: Pulmonary effort is normal. No respiratory distress.     Breath sounds: Normal breath sounds.  Abdominal:     Palpations: Abdomen is soft.     Tenderness: There is no  abdominal tenderness.     Comments: Rectal exam unremarkable.  Brown stool  Musculoskeletal:     Cervical back: Neck supple.  Skin:    General: Skin is warm and dry.  Neurological:     General: No focal deficit present.     Mental Status: She is alert.  Psychiatric:        Mood and Affect: Mood  normal.     ED Results / Procedures / Treatments   Labs (all labs ordered are listed, but only abnormal results are displayed) Labs Reviewed  BASIC METABOLIC PANEL - Abnormal; Notable for the following components:      Result Value   Glucose, Bld 114 (*)    Creatinine, Ser 1.14 (*)    Calcium 8.6 (*)    GFR, Estimated 45 (*)    All other components within normal limits  CBC WITH DIFFERENTIAL/PLATELET - Abnormal; Notable for the following components:   RBC 2.77 (*)    Hemoglobin 8.4 (*)    HCT 26.5 (*)    Platelets 110 (*)    Lymphs Abs 0.5 (*)    All other components within normal limits  BRAIN NATRIURETIC PEPTIDE - Abnormal; Notable for the following components:   B Natriuretic Peptide 435.1 (*)    All other components within normal limits  TROPONIN I (HIGH SENSITIVITY) - Abnormal; Notable for the following components:   Troponin I (High Sensitivity) 44 (*)    All other components within normal limits  TROPONIN I (HIGH SENSITIVITY) - Abnormal; Notable for the following components:   Troponin I (High Sensitivity) 44 (*)    All other components within normal limits  OCCULT BLOOD X 1 CARD TO LAB, STOOL  POC OCCULT BLOOD, ED    EKG None  Radiology DG Chest 2 View  Result Date: 11/21/2020 CLINICAL DATA:  Shortness of breath for 2-3 weeks, worse in morning, history of hypertension COPD EXAM: CHEST - 2 VIEW COMPARISON:  Radiograph 11/17/2020 FINDINGS: Stable chronic left pleural effusion. Coarsened interstitial changes and bronchitic features are also unchanged from comparison imaging. No new consolidative process. No right pleural effusion. No pneumothorax. Stable  cardiomediastinal contours with a calcified aorta. No acute osseous or soft tissue abnormality. Degenerative changes are present in the imaged spine and shoulders. Cholecystectomy clips are present in the right upper quadrant. IMPRESSION: Chronic left pleural effusion and coarsened interstitial changes, unchanged from comparisons. No acute cardiopulmonary abnormality. Aortic Atherosclerosis (ICD10-I70.0). Electronically Signed   By: Lovena Le M.D.   On: 11/21/2020 19:35    Procedures Procedures   Medications Ordered in ED Medications - No data to display  ED Course  I have reviewed the triage vital signs and the nursing notes.  Pertinent labs & imaging results that were available during my care of the patient were reviewed by me and considered in my medical decision making (see chart for details).    MDM Rules/Calculators/A&P                          Kathryn presents to the ED for the third time in roughly 1 week complaining of dyspnea.  She has chronic medical conditions including COPD and CHF that do in fact give her somewhat chronic dyspnea.  She has had multiple evaluations that have revealed chronic but stable abnormalities with slightly elevated B natruretic peptide values, slightly elevated troponin values.  Here in the ED her work-up was roughly similar to her prior work-ups, but her hemoglobin was much lower than it had been 4 days ago.  Her platelet count was also lower.  This seemed to be abnormal, and I am concerned about a possible aplastic issue.  She is taking cephalexin for a UTI.  Otherwise, there has been no difference in her medication regimen.  She has not noted any blood loss.  She will see her primary care  doctor in 1 week for recheck of her CBC parameters.  I have also referred her for home health including palliative care consult. Final Clinical Impression(s) / ED Diagnoses Final diagnoses:  Chronic systolic congestive heart failure (HCC)  Chronic obstructive  pulmonary disease, unspecified COPD type (HCC)  Anemia, unspecified type  Thrombocytopenia ([place])  Primary hypertension    Rx / DC Orders ED Discharge Orders         Ordered    Consult to Transition of Care Team       Comments: Multiple emergency department visits for chronic complaints.  Would benefit from consideration of palliative care, additional assistance at home, etc.  Provider:  (Not yet assigned)   11/21/20 1852    Home Health       Comments: ANJANI KILCREASE has COPD and CHF.  She has gradually declined as far as her activities of daily living and can only walk short distances without feeling weak and short of breath.  Her family performs a lot of her activities of daily living, but her care needs have increased.  She has been to the emergency department 3 times in 1 week for shortness of breath.  I think she would benefit from an assessment as to whether or not she would benefit from home health care.  She is very interested in staying independent and in her home.   11/21/20 2209    Face-to-face encounter (required for Medicare/Medicaid patients)       Comments: [facility] certify that this patient is under my care and that I, or a nurse practitioner or physician's assistant working with me, had a face-to-face encounter that meets the physician face-to-face encounter requirements with this patient on 11/21/2020. The encounter with the patient was in whole, or in part for the following medical condition(s) which is the primary reason for home health care (List medical condition): CHF, COPD   11/21/20 2209           Arnaldo Natal, MD 11/21/20 2227

## 2020-11-23 ENCOUNTER — Encounter (INDEPENDENT_AMBULATORY_CARE_PROVIDER_SITE_OTHER): Payer: Self-pay | Admitting: Otolaryngology

## 2020-11-23 ENCOUNTER — Other Ambulatory Visit: Payer: Self-pay

## 2020-11-23 ENCOUNTER — Other Ambulatory Visit: Payer: Self-pay | Admitting: Adult Health

## 2020-11-23 ENCOUNTER — Ambulatory Visit (INDEPENDENT_AMBULATORY_CARE_PROVIDER_SITE_OTHER): Payer: Medicare PPO | Admitting: Otolaryngology

## 2020-11-23 VITALS — Temp 97.2°F

## 2020-11-23 DIAGNOSIS — H6123 Impacted cerumen, bilateral: Secondary | ICD-10-CM

## 2020-11-23 DIAGNOSIS — H903 Sensorineural hearing loss, bilateral: Secondary | ICD-10-CM

## 2020-11-23 NOTE — Progress Notes (Signed)
HPI: [name] is a 87 y.o. female who presents for evaluation of wax bit of her ears and the patient wears hearing aids..  Past Medical History:  Diagnosis Date  . Anxiety   . Aortic arch atherosclerosis ([place]) 06/24/2014  . Atherosclerotic ulcer of aorta ([place]) 06/24/2014  . Carotid artery occlusion   . Chronic kidney disease   . COPD (chronic obstructive pulmonary disease) ([place])   . [place] DISEASE, LUMBAR 12/16/2008  . DIVERTICULOSIS, COLON 09/30/2008  . DYSPNEA 07/13/2008  . Graves disease   . History of embolic stroke 8/84/1660   Left brain  . HYPERLIPIDEMIA 03/06/2007  . HYPERTENSION 03/06/2007  . HYPOTHYROIDISM 10/13/2007  . OSTEOARTHRITIS 03/06/2007  . Personal history of colonic polyps 09/30/2008  . Stroke ([place])    06/2014           . TOBACCO USE, QUIT 04/12/2009  . WEAKNESS 11/09/2007   Past Surgical History:  Procedure Laterality Date  . CARDIAC CATHETERIZATION    . CATARACT EXTRACTION Bilateral   . CHOLECYSTECTOMY    . ENDARTERECTOMY Left 11/13/2015   Procedure: LEFT CAROTID ARTERY ENDARTERECTOMY;  Surgeon: Conrad [place], MD;  Location: [facility];  Service: Vascular;  Laterality: Left;  . ESOPHAGOGASTRODUODENOSCOPY (EGD) WITH PROPOFOL N/A 10/11/2016   Procedure: ESOPHAGOGASTRODUODENOSCOPY (EGD) WITH PROPOFOL;  Surgeon: Mauri Pole, MD;  [facility];  Service: Endoscopy;  Laterality: N/A;  . KNEE SURGERY    . PATCH ANGIOPLASTY Left 11/13/2015   Procedure: WITH 1CM X 6CM  XENOSURE BIOLOGIC PATCH ANGIOPLASTY;  Surgeon: Conrad [place], MD;  Location: [facility];  Service: Vascular;  Laterality: Left;  . TEE WITHOUT CARDIOVERSION N/A 06/24/2014   Procedure: TRANSESOPHAGEAL ECHOCARDIOGRAM (TEE);  Surgeon: Sanda Klein, MD;  Location[facility] ENDOSCOPY;  Service: Cardiovascular;  Laterality: N/A;   Social History   Socioeconomic History  . Marital status: Divorced    Spouse name: Not on file  . Number of children: 4  . Years of education: college  . Highest education level: Not  on file  Occupational History  . Occupation: retired  Tobacco Use  . Smoking status: Former Smoker    Packs/day: 1.50    Years: 29.00    Pack years: 43.50    Types: Cigarettes    Start date: 07/08/1949    Quit date: 07/08/1978    Years since quitting: 42.4  . Smokeless tobacco: Never Used  Vaping Use  . Vaping Use: Never used  Substance and Sexual Activity  . Alcohol use: Yes    Alcohol/week: 1.0 standard drink    Types: 1 Glasses of wine per week    Comment: "red wine"- some nights  . Drug use: No  . Sexual activity: Not on file  Other Topics Concern  . Not on file  Social History Narrative      Patient is right handed.   Patient drinks 1 cup caffeine daily.   Social Determinants of Health   Financial Resource Strain: Not on file  Food Insecurity: Not on file  Transportation Needs: Not on file  Physical Activity: Not on file  Stress: Not on file  Social Connections: Not on file   Family History  Problem Relation Age of Onset  . Stomach cancer Maternal Grandmother   . Colon cancer Neg Hx    Allergies  Allergen Reactions  . Catapres [Clonidine Hcl] Other (See Comments)    Made pt feel horrible, shaky, weak and nausea  . Clonidine Other (See Comments)    Made pt  feel horrible, shaky, weak and nausea  . Lisinopril Anaphylaxis    Tongue swelling  . Labetalol Other (See Comments)    Caused bradycardia and syncope  . Labetalol Hcl Other (See Comments)    Caused bradycardia and syncope   Prior to Admission medications   Medication Sig Start Date End Date Taking? Authorizing Provider  albuterol (PROAIR HFA) 108 (90 Base) MCG/ACT inhaler Inhale 2 puffs into the lungs every 6 (six) hours as needed for wheezing or shortness of breath. 11/13/17   Volanda Napoleon, PA-C  ALPRAZolam Duanne Moron) 0.5 MG tablet Take 0.5-1 tablets (0.25-0.5 mg total) by mouth 2 (two) times daily as needed for anxiety. 09/05/20   Nafziger, Tommi Rumps, NP  amLODipine (NORVASC) 5 MG tablet Take 1 tablet (5 mg  total) by mouth daily. 10/12/20   Josue Hector, MD  atorvastatin (LIPITOR) 80 MG tablet TAKE 1 TABLET BY MOUTH EVERY DAY AT 6PM Patient taking differently: Take 80 mg by mouth daily. 04/20/20   Nafziger, Tommi Rumps, NP  furosemide (LASIX) 20 MG tablet Take 1 tablet (20 mg total) by mouth daily. 09/05/20   Regalado, Belkys A, MD  hydrALAZINE (APRESOLINE) 50 MG tablet Take 1 tablet (50 mg total) by mouth 3 (three) times daily. 09/25/20   Josue Hector, MD  ipratropium-albuterol (DUONEB) 0.5-2.5 (3) MG/3ML SOLN Take 3 mLs by nebulization every 6 (six) hours as needed (copd). 01/16/20   Danford, Suann Larry, MD  isosorbide mononitrate (IMDUR) 30 MG 24 hr tablet Take 1 tablet (30 mg total) by mouth daily. 09/04/20   Regalado, Belkys A, MD  latanoprost (XALATAN) 0.005 % ophthalmic solution Place 1 drop into both eyes at bedtime.  04/02/19   [provider]  levothyroxine (SYNTHROID) 75 MCG tablet Take 1 tablet (75 mcg total) by mouth daily before breakfast. 07/04/20   Nafziger, Tommi Rumps, NP  pantoprazole (PROTONIX) 40 MG tablet Take 1 tablet (40 mg total) by mouth daily. 09/04/20   Regalado, Belkys A, MD  potassium chloride SA (KLOR-CON) 20 MEQ tablet Take 1 tablet (20 mEq total) by mouth daily. 09/04/20   Regalado, Belkys A, MD  predniSONE (DELTASONE) 5 MG tablet Take 5-10 mg by mouth daily with breakfast.    [provider]  Spacer/Aero-Holding Chambers (AEROCHAMBER MV) inhaler Use as instructed Patient taking differently: 1 each by Other route as directed. 11/26/17   Magdalen Spatz, NP  TRELEGY ELLIPTA 100-62.5-25 MCG/INH AEPB INHALE 1 PUFF BY MOUTH EVERY DAY Patient taking differently: Inhale 1 puff into the lungs daily. 08/09/20   Mannam, Hart Robinsons, MD     Positive ROS: Otherwise negative  All other systems have been reviewed and were otherwise negative with the exception of those mentioned in the HPI and as above.  Physical Exam: Constitutional: Alert, well-appearing, no acute distress Ears:  External ears without lesions or tenderness. Ear canals more wax on the right side compared to the left.  She has small ear canals bilaterally.  Earwax was cleaned with suction curettes and forceps.  TMs were clear.. Nasal: External nose without lesions. Clear nasal passages Oral: Oropharynx clear. Neck: No palpable adenopathy or masses Respiratory: Breathing comfortably  Skin: No facial/neck lesions or rash noted.  Cerumen impaction removal  Date/Time: 11/23/2020 5:14 PM Performed by: Rozetta Nunnery, MD Authorized by: Rozetta Nunnery, MD   Consent:    Consent obtained:  Verbal   Consent given by:  Patient   Risks discussed:  Pain and bleeding Procedure details:    Location:  L  ear and R ear   Procedure type: curette, suction and forceps   Post-procedure details:    Inspection:  TM intact and canal normal   Hearing quality:  Improved   Patient tolerance of procedure:  Tolerated well, no immediate complications Comments:     TMs are clear bilaterally.    Assessment: Wax buildup in patient who wears hearing aids.  Plan: This was cleaned in the office.  She will follow-up as needed.  Radene Journey, MD

## 2020-11-23 NOTE — Telephone Encounter (Signed)
I will have Tommi Rumps take a look at this when he comes back next week

## 2020-11-26 ENCOUNTER — Other Ambulatory Visit: Payer: Self-pay | Admitting: Adult Health

## 2020-11-26 DIAGNOSIS — D649 Anemia, unspecified: Secondary | ICD-10-CM

## 2020-11-27 ENCOUNTER — Telehealth: Payer: Self-pay | Admitting: *Deleted

## 2020-11-27 NOTE — Telephone Encounter (Signed)
Pt daughter [name]) called regarding Palliative and Home Health referrals. RNCM reviewed chart and did not find referral had been sent.  RNCM offered list (verbally) of agencies.  [name] chose [facility] for home palliative and [facility] for Home Health services.  RNCM contacted liaisons for each agency to extend referral.

## 2020-11-28 ENCOUNTER — Emergency Department (HOSPITAL_COMMUNITY): Payer: Medicare PPO

## 2020-11-28 ENCOUNTER — Other Ambulatory Visit: Payer: Self-pay

## 2020-11-28 ENCOUNTER — Encounter (HOSPITAL_COMMUNITY): Payer: Self-pay | Admitting: Pharmacy Technician

## 2020-11-28 ENCOUNTER — Emergency Department (HOSPITAL_COMMUNITY)
Admission: EM | Admit: 2020-11-28 | Discharge: 2020-11-28 | Disposition: A | Discharge status: Home / Self Care | Payer: Medicare PPO | Attending: Emergency Medicine | Admitting: Emergency Medicine

## 2020-11-28 DIAGNOSIS — N183 Chronic kidney disease, stage 3 unspecified: Secondary | ICD-10-CM | POA: Insufficient documentation

## 2020-11-28 DIAGNOSIS — I10 Essential (primary) hypertension: Secondary | ICD-10-CM | POA: Diagnosis not present

## 2020-11-28 DIAGNOSIS — H532 Diplopia: Secondary | ICD-10-CM | POA: Diagnosis not present

## 2020-11-28 DIAGNOSIS — I5021 Acute systolic (congestive) heart failure: Secondary | ICD-10-CM | POA: Diagnosis not present

## 2020-11-28 DIAGNOSIS — Z87891 Personal history of nicotine dependence: Secondary | ICD-10-CM | POA: Diagnosis not present

## 2020-11-28 DIAGNOSIS — R0689 Other abnormalities of breathing: Secondary | ICD-10-CM | POA: Diagnosis not present

## 2020-11-28 DIAGNOSIS — E039 Hypothyroidism, unspecified: Secondary | ICD-10-CM | POA: Diagnosis not present

## 2020-11-28 DIAGNOSIS — S99921A Unspecified injury of right foot, initial encounter: Secondary | ICD-10-CM | POA: Diagnosis not present

## 2020-11-28 DIAGNOSIS — R42 Dizziness and giddiness: Secondary | ICD-10-CM

## 2020-11-28 DIAGNOSIS — R531 Weakness: Secondary | ICD-10-CM | POA: Diagnosis not present

## 2020-11-28 DIAGNOSIS — J449 Chronic obstructive pulmonary disease, unspecified: Secondary | ICD-10-CM | POA: Insufficient documentation

## 2020-11-28 DIAGNOSIS — Z23 Encounter for immunization: Secondary | ICD-10-CM | POA: Diagnosis not present

## 2020-11-28 DIAGNOSIS — Z20822 Contact with and (suspected) exposure to covid-19: Secondary | ICD-10-CM | POA: Insufficient documentation

## 2020-11-28 DIAGNOSIS — Z79899 Other long term (current) drug therapy: Secondary | ICD-10-CM | POA: Diagnosis not present

## 2020-11-28 DIAGNOSIS — I130 Hypertensive heart and chronic kidney disease with heart failure and stage 1 through stage 4 chronic kidney disease, or unspecified chronic kidney disease: Secondary | ICD-10-CM | POA: Diagnosis not present

## 2020-11-28 DIAGNOSIS — I720 Aneurysm of carotid artery: Secondary | ICD-10-CM | POA: Insufficient documentation

## 2020-11-28 DIAGNOSIS — I499 Cardiac arrhythmia, unspecified: Secondary | ICD-10-CM | POA: Diagnosis not present

## 2020-11-28 DIAGNOSIS — G319 Degenerative disease of nervous system, unspecified: Secondary | ICD-10-CM | POA: Diagnosis not present

## 2020-11-28 DIAGNOSIS — R29810 Facial weakness: Secondary | ICD-10-CM | POA: Diagnosis not present

## 2020-11-28 LAB — DIFFERENTIAL
Abs Immature Granulocytes: 0.01 10*3/uL (ref 0.00–0.07)
Basophils Absolute: 0 10*3/uL (ref 0.0–0.1)
Basophils Relative: 1 %
Eosinophils Absolute: 0.2 10*3/uL (ref 0.0–0.5)
Eosinophils Relative: 3 %
Immature Granulocytes: 0 %
Lymphocytes Relative: 18 %
Lymphs Abs: 1 10*3/uL (ref 0.7–4.0)
Monocytes Absolute: 0.7 10*3/uL (ref 0.1–1.0)
Monocytes Relative: 13 %
Neutro Abs: 3.4 10*3/uL (ref 1.7–7.7)
Neutrophils Relative %: 65 %

## 2020-11-28 LAB — CBC
HCT: 37.9 % (ref 36.0–46.0)
Hemoglobin: 12 g/dL (ref 12.0–15.0)
MCH: 30.6 pg (ref 26.0–34.0)
MCHC: 31.7 g/dL (ref 30.0–36.0)
MCV: 96.7 fL (ref 80.0–100.0)
Platelets: 147 10*3/uL — ABNORMAL LOW (ref 150–400)
RBC: 3.92 MIL/uL (ref 3.87–5.11)
RDW: 14 % (ref 11.5–15.5)
WBC: 5.3 10*3/uL (ref 4.0–10.5)
nRBC: 0 % (ref 0.0–0.2)

## 2020-11-28 LAB — COMPREHENSIVE METABOLIC PANEL
ALT: 17 U/L (ref 0–44)
AST: 18 U/L (ref 15–41)
Albumin: 3.3 g/dL — ABNORMAL LOW (ref 3.5–5.0)
Alkaline Phosphatase: 78 U/L (ref 38–126)
Anion gap: 7 (ref 5–15)
BUN: 17 mg/dL (ref 8–23)
CO2: 28 mmol/L (ref 22–32)
Calcium: 9.1 mg/dL (ref 8.9–10.3)
Chloride: 103 mmol/L (ref 98–111)
Creatinine, Ser: 1.17 mg/dL — ABNORMAL HIGH (ref 0.44–1.00)
GFR, Estimated: 44 mL/min — ABNORMAL LOW (ref >60)
Glucose, Bld: 95 mg/dL (ref 70–99)
Potassium: 3.7 mmol/L (ref 3.5–5.1)
Sodium: 138 mmol/L (ref 135–145)
Total Bilirubin: 1.1 mg/dL (ref 0.3–1.2)
Total Protein: 5.8 g/dL — ABNORMAL LOW (ref 6.5–8.1)

## 2020-11-28 LAB — RESP PANEL BY RT-PCR (FLU A&B, COVID) ARPGX2
Influenza A by PCR: NEGATIVE
Influenza B by PCR: NEGATIVE
SARS Coronavirus 2 by RT PCR: NEGATIVE

## 2020-11-28 LAB — URINALYSIS, ROUTINE W REFLEX MICROSCOPIC
Bilirubin Urine: NEGATIVE
Glucose, UA: NEGATIVE mg/dL
Hgb urine dipstick: NEGATIVE
Ketones, ur: NEGATIVE mg/dL
Leukocytes,Ua: NEGATIVE
Nitrite: NEGATIVE
Protein, ur: NEGATIVE mg/dL
Specific Gravity, Urine: 1.006 (ref 1.005–1.030)
pH: 8 (ref 5.0–8.0)

## 2020-11-28 LAB — I-STAT CHEM 8, ED
BUN: 28 mg/dL — ABNORMAL HIGH (ref 8–23)
Calcium, Ion: 1.12 mmol/L — ABNORMAL LOW (ref 1.15–1.40)
Chloride: 102 mmol/L (ref 98–111)
Creatinine, Ser: 1.2 mg/dL — ABNORMAL HIGH (ref 0.44–1.00)
Glucose, Bld: 91 mg/dL (ref 70–99)
HCT: 34 % — ABNORMAL LOW (ref 36.0–46.0)
Hemoglobin: 11.6 g/dL — ABNORMAL LOW (ref 12.0–15.0)
Potassium: 6.5 mmol/L (ref 3.5–5.1)
Sodium: 136 mmol/L (ref 135–145)
TCO2: 31 mmol/L (ref 22–32)

## 2020-11-28 LAB — RAPID URINE DRUG SCREEN, HOSP PERFORMED
Amphetamines: NOT DETECTED
Barbiturates: NOT DETECTED
Benzodiazepines: POSITIVE — AB
Cocaine: NOT DETECTED
Opiates: NOT DETECTED
Tetrahydrocannabinol: NOT DETECTED

## 2020-11-28 LAB — APTT: aPTT: 27 seconds (ref 24–36)

## 2020-11-28 LAB — PROTIME-INR
INR: 1 (ref 0.8–1.2)
Prothrombin Time: 13.4 seconds (ref 11.4–15.2)

## 2020-11-28 LAB — ETHANOL: Alcohol, Ethyl (B): <10 mg/dL (ref <10)

## 2020-11-28 MED ORDER — TETANUS-DIPHTH-ACELL PERTUSSIS 5-2.5-18.5 LF-MCG/0.5 IM SUSY
0.5000 mL | PREFILLED_SYRINGE | Freq: Once | INTRAMUSCULAR | Status: AC
  Administered 2020-11-28: 0.5 mL via INTRAMUSCULAR
  Filled 2020-11-28: qty 0.5

## 2020-11-28 NOTE — Discharge Instructions (Addendum)
Get help right away if: You have a sudden, severe headache with no known cause. This may include a stiff neck. You have sudden nausea or vomiting with a severe headache. You have a seizure. You have other symptoms of a stroke. "BE FAST" is an easy way to remember the main warning signs of a stroke: B - Balance. Signs are dizziness, sudden trouble walking, or loss of balance. E - Eyes. Signs are trouble seeing or a sudden change in vision. F - Face. Signs are sudden weakness or numbness of the face, or the face or eyelid drooping on one side. A - Arms. Signs are weakness or numbness in an arm. This happens suddenly and usually on one side of the body. S - Speech. Signs are sudden trouble speaking, slurred speech, or trouble understanding what people say. T - Time. Time to call emergency services. Write down what time symptoms started.

## 2020-11-28 NOTE — ED Notes (Signed)
Critical lab result given to Dr. Maryan Rued

## 2020-11-28 NOTE — ED Provider Notes (Signed)
[facility] EMERGENCY DEPARTMENT Provider Note   CSN: [id number] Arrival date & time: 11/28/20  0865     History No chief complaint on file.   [name] is a 87 y.o. female.  Who presents emergency department for right leg weakness.  The patient states that yesterday she had blurry and double vision all day.  She realized this when she was trying to read words on the television.  She states that she could tilt her head into a certain position and the words would improve.  She is unable to differentiate if it was both or 1 eye.  She states that she wrote it off as being part of her macular degeneration.  This morning when she awoke she felt as though she was leaning toward the right side.  She felt off balance.  She tried to get up and walk to her bathroom using her walker but states that her right leg "was all wobbly."  She states that it felt weak and she was not able to move it normally and ended up hitting her toe against the dresser.  She has a small wound on her second right toe and is unsure of her last tetanus vaccination.  She states that she no longer feels like she is leaning to the right side but still cannot move her leg appropriately.  She has a past medical history of atherosclerosis, CKD, COPD  on 5 mg of daily prednisone, and history of previous CVA.  HPI     Past Medical History:  Diagnosis Date  . Anxiety   . Aortic arch atherosclerosis ([place]) 06/24/2014  . Atherosclerotic ulcer of aorta ([place]) 06/24/2014  . Carotid artery occlusion   . Chronic kidney disease   . COPD (chronic obstructive pulmonary disease) ([place])   . [place] DISEASE, LUMBAR 12/16/2008  . DIVERTICULOSIS, COLON 09/30/2008  . DYSPNEA 07/13/2008  . Graves disease   . History of embolic stroke 7/84/6962   Left brain  . HYPERLIPIDEMIA 03/06/2007  . HYPERTENSION 03/06/2007  . HYPOTHYROIDISM 10/13/2007  . OSTEOARTHRITIS 03/06/2007  . Personal history of colonic polyps 09/30/2008  . Stroke ([place])     06/2014           . TOBACCO USE, QUIT 04/12/2009  . WEAKNESS 11/09/2007    Patient Active Problem List   Diagnosis Date Noted  . Acute systolic CHF (congestive heart failure) ([place]) 09/01/2020  . Acute respiratory failure ([place])   . Mild aortic stenosis 08/30/2020  . Elevated troponin 08/30/2020  . Pleural effusion on left 08/30/2020  . Pain in joint of left knee 04/10/2020  . D-dimer, elevated 01/14/2020  . Elevated brain natriuretic peptide (BNP) level 01/14/2020  . Advice given about COVID-19 virus infection 07/21/2019  . Current chronic use of systemic steroids 07/13/2019  . Medication management 04/02/2018  . COPD exacerbation ([place]) 03/31/2018  . Hypokalemia 03/30/2018  . COPD with acute exacerbation ([place]) 03/02/2018  . SOB (shortness of breath) 01/12/2018  . CKD (chronic kidney disease), stage III 01/12/2018  . New onset left bundle branch block (LBBB) 01/12/2018  . COPD GOLD I D 01/12/2018  . Neurogenic claudication due to lumbar spinal stenosis 09/13/2017  . Symptomatic anemia   . AVM (arteriovenous malformation) of duodenum, acquired with hemorrhage   . Acute GI bleeding 10/09/2016  . COPD with emphysema ([place]) 05/10/2016  . Recurrent laryngeal neuropathy 02/01/2016  . Nausea with vomiting 12/11/2015  . Left carotid stenosis 11/21/2015  . Asymptomatic carotid artery stenosis 11/13/2015  .  Impaired glucose tolerance 10/27/2015  . Solitary pulmonary nodule 10/27/2015  . Thyroid nodule 10/27/2015  . Syncope 10/04/2015  . Bradycardia with less than 60 beats per minute 10/04/2015  . History of embolic stroke 52/84/1324  . Paresthesia of both feet 07/06/2014  . Aortic arch atherosclerosis ([place]) 06/24/2014  . Atherosclerotic ulcer of aorta ([place]) 06/24/2014  . Stroke ([place]) 06/22/2014  . Bilateral carotid artery disease ([place]) 05/18/2014  . TOBACCO USE, QUIT 04/12/2009  . [place] DISEASE, LUMBAR 12/16/2008  . DIVERTICULOSIS, COLON 09/30/2008  . Personal history of colonic  polyps 09/30/2008  . DYSPNEA 07/13/2008  . Weakness 11/09/2007  . Hypothyroidism 10/13/2007  . Mixed hyperlipidemia 03/06/2007  . Essential hypertension 03/06/2007  . Osteoarthritis 03/06/2007    Past Surgical History:  Procedure Laterality Date  . CARDIAC CATHETERIZATION    . CATARACT EXTRACTION Bilateral   . CHOLECYSTECTOMY    . ENDARTERECTOMY Left 11/13/2015   Procedure: LEFT CAROTID ARTERY ENDARTERECTOMY;  Surgeon: Conrad Glendon, MD;  Location: [place];  Service: Vascular;  Laterality: Left;  . ESOPHAGOGASTRODUODENOSCOPY (EGD) WITH PROPOFOL N/A 10/11/2016   Procedure: ESOPHAGOGASTRODUODENOSCOPY (EGD) WITH PROPOFOL;  Surgeon: Mauri Pole, MD;  Location: [facility] END[facility]COPY;  Service: Endoscopy;  Laterality: N/A;  . KNEE SURGERY    . PATCH ANGIOPLASTY Left 11/13/2015   Procedure: WITH 1CM X 6CM  XENOSURE BIOLOGIC PATCH ANGIOPLASTY;  Surgeon: Conrad Isabella, MD;  Location: [place];  Service: Vascular;  Laterality: Left;  . TEE WITHOUT CARDIOVERSION N/A 06/24/2014   Procedure: TRANSESOPHAGEAL ECHOCARDIOGRAM (TEE);  Surgeon: Sanda Klein, MD;  Location[facility] ENDOSCOPY;  Service: Cardiovascular;  Laterality: N/A;     OB History   No obstetric history on file.     Family History  Problem Relation Age of Onset  . Stomach cancer Maternal Grandmother   . Colon cancer Neg Hx     Social History   Tobacco Use  . Smoking status: Former Smoker    Packs/day: 1.50    Years: 29.00    Pack years: 43.50    Types: Cigarettes    Start date: 07/08/1949    Quit date: 07/08/1978    Years since quitting: 42.4  . Smokeless tobacco: Never Used  Vaping Use  . Vaping Use: Never used  Substance Use Topics  . Alcohol use: Yes    Alcohol/week: 1.0 standard drink    Types: 1 Glasses of wine per week    Comment: "red wine"- some nights  . Drug use: No    Home Medications Prior to Admission medications   Medication Sig Start Date End Date Taking? Authorizing Provider  albuterol (PROAIR HFA) 108 (90 Base)  MCG/ACT inhaler Inhale 2 puffs into the lungs every 6 (six) hours as needed for wheezing or shortness of breath. 11/13/17  Yes Volanda [place], PA-C  ALPRAZolam (XANAX) 0.5 MG tablet Take 0.5-1 tablets (0.25-0.5 mg total) by mouth 2 (two) times daily as needed for anxiety. 09/05/20  Yes Nafziger, Tommi Rumps, NP  amLODipine (NORVASC) 5 MG tablet Take 1 tablet (5 mg total) by mouth daily. 10/12/20  Yes Josue Hector, MD  atorvastatin (LIPITOR) 80 MG tablet TAKE 1 TABLET BY MOUTH EVERY DAY AT 6PM Patient taking differently: Take 80 mg by mouth daily. 04/20/20  Yes Nafziger, Tommi Rumps, NP  furosemide (LASIX) 20 MG tablet Take 1 tablet (20 mg total) by mouth daily. 09/05/20  Yes Regalado, Belkys A, MD  hydrALAZINE (APRESOLINE) 50 MG tablet Take 1 tablet (50 mg total) by mouth 3 (three) times  daily. 09/25/20  Yes Josue Hector, MD  ipratropium-albuterol (DUONEB) 0.5-2.5 (3) MG/3ML SOLN Take 3 mLs by nebulization every 6 (six) hours as needed (copd). 01/16/20  Yes Danford, Suann Larry, MD  isosorbide mononitrate (IMDUR) 30 MG 24 hr tablet Take 1 tablet (30 mg total) by mouth daily. 09/04/20  Yes Regalado, Belkys A, MD  latanoprost (XALATAN) 0.005 % ophthalmic solution Place 1 drop into both eyes at bedtime.  04/02/19  Yes [provider]  levothyroxine (SYNTHROID) 75 MCG tablet Take 1 tablet (75 mcg total) by mouth daily before breakfast. 07/04/20  Yes Nafziger, Tommi Rumps, NP  potassium chloride SA (KLOR-CON) 20 MEQ tablet Take 1 tablet (20 mEq total) by mouth daily. 09/04/20  Yes Regalado, Belkys A, MD  predniSONE (DELTASONE) 5 MG tablet Take 5 mg by mouth daily with breakfast.   Yes [provider]  Spacer/Aero-Holding Chambers (AEROCHAMBER MV) inhaler Use as instructed Patient taking differently: 1 each by Other route as directed. 11/26/17  Yes Magdalen Spatz, NP  TRELEGY ELLIPTA 100-62.5-25 MCG/INH AEPB INHALE 1 PUFF BY MOUTH EVERY DAY Patient taking differently: Inhale 1 puff into the lungs daily. 08/09/20   Yes Mannam, Praveen, MD  pantoprazole (PROTONIX) 40 MG tablet TAKE 1 TABLET BY MOUTH EVERY DAY 11/28/20   Nafziger, Tommi Rumps, NP    Allergies    Catapres [clonidine hcl], Clonidine, Lisinopril, Labetalol, and Labetalol hcl  Review of Systems   Review of Systems Ten systems reviewed and are negative for acute change, except as noted in the HPI.   Physical Exam Updated Vital Signs BP (!) 152/75   Pulse (!) 56   Temp 98.2 F (36.8 C) (Oral)   Resp (!) 24   Ht 5\' 6"  (1.676 m)   Wt 55.3 kg   SpO2 96%   BMI 19.69 kg/m   Physical Exam Vitals and nursing note reviewed.  Constitutional:      General: She is not in acute distress.    Appearance: She is well-developed. She is not diaphoretic.  HENT:     Head: Normocephalic and atraumatic.  Eyes:     General: Vision grossly intact. No visual field deficit or scleral icterus.    Extraocular Movements: Extraocular movements intact.     Conjunctiva/sclera: Conjunctivae normal.     Pupils: Pupils are equal, round, and reactive to light.     Comments: No visual field cuts  Cardiovascular:     Rate and Rhythm: Normal rate and regular rhythm.     Heart sounds: Normal heart sounds. No murmur heard. No friction rub. No gallop.   Pulmonary:     Effort: Pulmonary effort is normal. No respiratory distress.     Breath sounds: Normal breath sounds.  Abdominal:     General: Bowel sounds are normal. There is no distension.     Palpations: Abdomen is soft. There is no mass.     Tenderness: There is no abdominal tenderness. There is no guarding.  Musculoskeletal:     Cervical back: Normal range of motion.  Skin:    General: Skin is warm and dry.  Neurological:     Mental Status: She is alert and oriented to person, place, and time.     GCS: GCS eye subscore is 4. GCS verbal subscore is 5. GCS motor subscore is 6.     Cranial Nerves: No cranial nerve deficit, dysarthria or facial asymmetry.     Sensory: No sensory deficit.     Motor: Weakness  present. No tremor or  pronator drift.     Coordination: Coordination normal. Finger-Nose-Finger Test normal.     Comments: UE equal grip strength and LE 5/5 strength with dorsi/plantar flexion at the ankle but 4/5 RLE proximal muscle strength   Psychiatric:        Behavior: Behavior normal.     ED Results / Procedures / Treatments   Labs (all labs ordered are listed, but only abnormal results are displayed) Labs Reviewed  CBC - Abnormal; Notable for the following components:      Result Value   Platelets 147 (*)    All other components within normal limits  COMPREHENSIVE METABOLIC PANEL - Abnormal; Notable for the following components:   Creatinine, Ser 1.17 (*)    Total Protein 5.8 (*)    Albumin 3.3 (*)    GFR, Estimated 44 (*)    All other components within normal limits  RAPID URINE DRUG SCREEN, HOSP PERFORMED - Abnormal; Notable for the following components:   Benzodiazepines POSITIVE (*)    All other components within normal limits  URINALYSIS, ROUTINE W REFLEX MICROSCOPIC - Abnormal; Notable for the following components:   Color, Urine STRAW (*)    All other components within normal limits  I-STAT CHEM 8, ED - Abnormal; Notable for the following components:   Potassium 6.5 (*)    BUN 28 (*)    Creatinine, Ser 1.20 (*)    Calcium, Ion 1.12 (*)    Hemoglobin 11.6 (*)    HCT 34.0 (*)    All other components within normal limits  RESP PANEL BY RT-PCR (FLU A&B, COVID) ARPGX2  ETHANOL  DIFFERENTIAL  PROTIME-INR  APTT    EKG EKG Interpretation  Date/Time:  Tuesday Nov 28 2020 08:04:51 EDT Ventricular Rate:  70 PR Interval:  177 QRS Duration: 124 QT Interval:  443 QTC Calculation: 478 R Axis:   215 Text Interpretation: Sinus rhythm Anterolateral infarct, old Left bundle branch block No significant change since last tracing Confirmed by Blanchie Dessert [phone]) on 11/28/2020 8:12:24 AM   Radiology MR BRAIN WO CONTRAST  Result Date: 11/28/2020 CLINICAL DATA:   Stroke suspected. Balance issues. Double vision. Left-sided facial droop. EXAM: MRI HEAD WITHOUT CONTRAST TECHNIQUE: Multiplanar, multiecho pulse sequences of the brain and surrounding structures were obtained without intravenous contrast. COMPARISON:  MRI Nov 21, 2015.  CTA Nov 21, 2015. FINDINGS: Brain: No acute infarction, hemorrhage, hydrocephalus, or extra-axial collection. Moderate generalized atrophy with ex vacuo ventricular dilation, similar to prior. There are couple small foci of susceptibility artifact in the left frontal lobe, which may represent prior microhemorrhages. Mild for age scattered T2/FLAIR hyperintensities within the white matter, most likely related to chronic microvascular ischemic disease. Suspected approximately 8 mm pituitary cyst. No significant suprasellar extension or mass effect on the optic chiasm. Vascular: Major arterial flow voids are maintained skull base. Dolichoectasia of the left internal carotid artery with aneurysmal dilation of the distal left cavernous segment. Although evaluation is limited on this noncontrast MRI, there is suspected interval enlargement of the aneurysm. Skull and upper cervical spine: Normal marrow signal. Sinuses/Orbits: Clear sinuses.  Unremarkable orbits. Other: Moderate right mastoid effusion. IMPRESSION: 1. No acute infarct. 2. Dolichoectasia of the left internal carotid artery with aneurysmal dilation of the distal left cavernous segment. Although evaluation is limited on this noncontrast MRI, there is suspected interval enlargement of the aneurysm and a CTA or MRA (CTA is preferred to allow direct comparison to 2017 prior) is recommended to further evaluate. 3. 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 While evaluation is limited on this standard noncontrast MRI, the cyst appears similar or decreased in size when comparing to the 2017 prior. No significant suprasellar extension.  Pituitary protocol MRI could further evaluate if clinically indicated. 4. Mild chronic microvascular ischemic disease and moderate atrophy. 5. Chronic moderate right mastoid effusion. Electronically Signed   By: Margaretha Sheffield MD   On: 11/28/2020 10:22   DG Chest Portable 1 View  Result Date: 11/29/2020 CLINICAL DATA:  Shortness of breath, mid back pain for 2 hours EXAM: PORTABLE CHEST 1 VIEW COMPARISON:  11/21/2020 FINDINGS: Cardiomegaly. Both lungs are clear. Unchanged small left pleural effusion. IMPRESSION: Cardiomegaly. Unchanged small left pleural effusion. No new airspace opacity. Electronically Signed   By: Eddie Candle M.D.   On: 11/29/2020 13:13   DG Foot Complete Right  Result Date: 11/28/2020 CLINICAL DATA:  Stubbed toe on furniture EXAM: RIGHT FOOT COMPLETE - 3+ VIEW COMPARISON:  None. FINDINGS: There is no evidence of fracture or dislocation. There is no evidence of arthropathy or other focal bone abnormality. IMPRESSION: No acute fracture. Electronically Signed   By: Macy Mis M.D.   On: 11/28/2020 11:43    Procedures Procedures   Medications Ordered in ED Medications  Tdap (BOOSTRIX) injection 0.5 mL (0.5 mLs Intramuscular Given 11/28/20 1020)    ED Course  I have reviewed the triage vital signs and the nursing notes.  Pertinent labs & imaging results that were available during my care of the patient were reviewed by me and considered in my medical decision making (see chart for details).  Clinical Course as of 11/29/20 1636  Tue Nov 28, 2020  0827 Patient with abnormal neuro deficits since yesterday morning. Out of window for TPA.  Will proceed with stroke work up and obtain MRI [AH]  1132 Potassium: 3.7 [AH]    Clinical Course User Index [AH] Margarita Mail, PA-C   MDM Rules/Calculators/A&P                          [id number] VS:  Vitals:   11/28/20 1018 11/28/20 1145 11/28/20 1230 11/28/20 1253  BP: (!) 158/127 (!) 157/65 (!) 152/75   Pulse: (!) 59  61 (!) 56   Resp: 16 18 (!) 24   Temp: 97.7 F (36.5 C) 98.6 F (37 C)  98.2 F (36.8 C)  TempSrc: Oral Oral  Oral  SpO2: 94% 94% 96%   Weight: 55.3 kg     Height: 5\' 6"  (1.676 m)       UJ:[id number] is gathered by patient and emr. Previous records obtained and reviewed. DDX:The patient's complaint of weakness involves an extensive number of diagnostic and treatment options, and is a complaint that carries with it a high risk of complications, morbidity, and potential mortality. Given the large differential diagnosis, medical decision making is of high complexity. The differential diagnosis of weakness includes but is not limited to neurologic causes (GBS, myasthenia gravis, CVA, MS, ALS, transverse myelitis, spinal cord injury, CVA, botulism, ) and other causes: ACS, Arrhythmia, syncope, orthostatic hypotension, sepsis, hypoglycemia, electrolyte disturbance, hypothyroidism, respiratory failure, symptomatic anemia, dehydration, heat injury, polypharmacy, malignancy.  Labs: I ordered reviewed and interpreted labs which include Cbc with mildly low platelets- insignificant cmp- mild renal insufficiency Chem 8- falsely elevated potassium -verified on chemistry panel  UA wnl  Remainder of lab findings wnl  Imaging: I ordered and reviewed images which included  MRI brain. I independently visualized and interpreted all imaging. Significant findings include large Left carotid cerebral aneurysm.  EKG:nsr rate 70 Consults:Dr. Kirpatrick - reccomends very close OP f/u with Neurology for large aneurysm given age WIO:XBDZHGD here with dipolpia, weakness, imbalance- MRI is negative for acute CVA- patient sxs are improved. Will d/c with close op f/u  Patient disposition:The patient appears reasonably screened and/or stabilized for discharge and I doubt any other medical condition or other [facility] requiring further screening, evaluation, or treatment in the ED at this time prior to discharge. I have discussed lab  and/or imaging findings with the patient and answered all questions/concerns to the best of my ability.I have discussed return precautions and OP follow up.    Final Clinical Impression(s) / ED Diagnoses Final diagnoses:  Weakness  Disequilibrium  Carotid artery aneurysm ([place])    Rx / DC Orders ED Discharge Orders         Ordered    Ambulatory referral to Neurology       Comments: An appointment is requested in approximately: 2 weeks- large Carotid artery aneurysm   11/28/20 1252           Margarita Mail, PA-C 11/29/20 1638    Blanchie Dessert, MD 12/03/20 2044

## 2020-11-28 NOTE — ED Notes (Signed)
Patient transported to MRI 

## 2020-11-28 NOTE — ED Notes (Signed)
Patient Alert and oriented to baseline. Stable and ambulatory to baseline. Patient verbalized understanding of the discharge instructions.  Patient belongings were taken by the patient.   

## 2020-11-28 NOTE — ED Triage Notes (Signed)
Pt bib ems from home, woke up with balance issues, felt like she was leaning. Pt reports double vision on arrival. Pt with L sided facial droop. Denies headache. LKW 2000 yesterday. 20g LAC.  178/80 HR 80 CBG 106 95% RA

## 2020-11-28 NOTE — Progress Notes (Signed)
[facility] ED [facility]) Hospital Liaison RN note  This is a pending outpatient based Palliative Care patient with [facility].  [facility] hospital liaison will continue to follow for discharge disposition.  Please call with any outpatient based palliative care related questions or concerns.  Thank you, Margaretmary Eddy, BSN, RN [facility] Liaison [phone]

## 2020-11-29 ENCOUNTER — Other Ambulatory Visit: Payer: Self-pay

## 2020-11-29 ENCOUNTER — Emergency Department (HOSPITAL_COMMUNITY): Payer: Medicare PPO

## 2020-11-29 ENCOUNTER — Encounter: Payer: Self-pay | Admitting: Adult Health

## 2020-11-29 ENCOUNTER — Telehealth: Payer: Medicare PPO | Admitting: Adult Health

## 2020-11-29 ENCOUNTER — Other Ambulatory Visit: Payer: Medicare PPO

## 2020-11-29 ENCOUNTER — Encounter (HOSPITAL_COMMUNITY): Payer: Self-pay

## 2020-11-29 ENCOUNTER — Emergency Department (HOSPITAL_COMMUNITY)
Admission: EM | Admit: 2020-11-29 | Discharge: 2020-11-29 | Disposition: A | Discharge status: Home / Self Care | Payer: Medicare PPO | Attending: Emergency Medicine | Admitting: Emergency Medicine

## 2020-11-29 DIAGNOSIS — I447 Left bundle-branch block, unspecified: Secondary | ICD-10-CM | POA: Diagnosis not present

## 2020-11-29 DIAGNOSIS — R0602 Shortness of breath: Secondary | ICD-10-CM | POA: Diagnosis not present

## 2020-11-29 DIAGNOSIS — J441 Chronic obstructive pulmonary disease with (acute) exacerbation: Secondary | ICD-10-CM | POA: Diagnosis not present

## 2020-11-29 DIAGNOSIS — I5021 Acute systolic (congestive) heart failure: Secondary | ICD-10-CM | POA: Diagnosis not present

## 2020-11-29 DIAGNOSIS — S9031XA Contusion of right foot, initial encounter: Secondary | ICD-10-CM | POA: Diagnosis not present

## 2020-11-29 DIAGNOSIS — Z79899 Other long term (current) drug therapy: Secondary | ICD-10-CM | POA: Insufficient documentation

## 2020-11-29 DIAGNOSIS — Z87891 Personal history of nicotine dependence: Secondary | ICD-10-CM | POA: Insufficient documentation

## 2020-11-29 DIAGNOSIS — E876 Hypokalemia: Secondary | ICD-10-CM | POA: Diagnosis not present

## 2020-11-29 DIAGNOSIS — R0682 Tachypnea, not elsewhere classified: Secondary | ICD-10-CM | POA: Diagnosis not present

## 2020-11-29 DIAGNOSIS — M549 Dorsalgia, unspecified: Secondary | ICD-10-CM | POA: Diagnosis not present

## 2020-11-29 DIAGNOSIS — R0689 Other abnormalities of breathing: Secondary | ICD-10-CM | POA: Diagnosis not present

## 2020-11-29 DIAGNOSIS — I671 Cerebral aneurysm, nonruptured: Secondary | ICD-10-CM | POA: Diagnosis not present

## 2020-11-29 DIAGNOSIS — I700 Atherosclerosis of aorta: Secondary | ICD-10-CM | POA: Diagnosis not present

## 2020-11-29 DIAGNOSIS — R42 Dizziness and giddiness: Secondary | ICD-10-CM | POA: Insufficient documentation

## 2020-11-29 DIAGNOSIS — I517 Cardiomegaly: Secondary | ICD-10-CM | POA: Diagnosis not present

## 2020-11-29 DIAGNOSIS — I129 Hypertensive chronic kidney disease with stage 1 through stage 4 chronic kidney disease, or unspecified chronic kidney disease: Secondary | ICD-10-CM | POA: Diagnosis not present

## 2020-11-29 DIAGNOSIS — M545 Low back pain, unspecified: Secondary | ICD-10-CM | POA: Insufficient documentation

## 2020-11-29 DIAGNOSIS — I130 Hypertensive heart and chronic kidney disease with heart failure and stage 1 through stage 4 chronic kidney disease, or unspecified chronic kidney disease: Secondary | ICD-10-CM | POA: Insufficient documentation

## 2020-11-29 DIAGNOSIS — E782 Mixed hyperlipidemia: Secondary | ICD-10-CM | POA: Diagnosis not present

## 2020-11-29 DIAGNOSIS — D631 Anemia in chronic kidney disease: Secondary | ICD-10-CM | POA: Diagnosis not present

## 2020-11-29 DIAGNOSIS — S99921A Unspecified injury of right foot, initial encounter: Secondary | ICD-10-CM | POA: Diagnosis present

## 2020-11-29 DIAGNOSIS — E039 Hypothyroidism, unspecified: Secondary | ICD-10-CM | POA: Insufficient documentation

## 2020-11-29 DIAGNOSIS — I5022 Chronic systolic (congestive) heart failure: Secondary | ICD-10-CM | POA: Diagnosis not present

## 2020-11-29 DIAGNOSIS — J90 Pleural effusion, not elsewhere classified: Secondary | ICD-10-CM | POA: Diagnosis not present

## 2020-11-29 DIAGNOSIS — R531 Weakness: Secondary | ICD-10-CM | POA: Diagnosis not present

## 2020-11-29 DIAGNOSIS — N183 Chronic kidney disease, stage 3 unspecified: Secondary | ICD-10-CM | POA: Insufficient documentation

## 2020-11-29 DIAGNOSIS — X58XXXA Exposure to other specified factors, initial encounter: Secondary | ICD-10-CM | POA: Diagnosis not present

## 2020-11-29 LAB — CBC WITH DIFFERENTIAL/PLATELET
Abs Immature Granulocytes: 0.03 10*3/uL (ref 0.00–0.07)
Basophils Absolute: 0 10*3/uL (ref 0.0–0.1)
Basophils Relative: 0 %
Eosinophils Absolute: 0.1 10*3/uL (ref 0.0–0.5)
Eosinophils Relative: 1 %
HCT: 37.9 % (ref 36.0–46.0)
Hemoglobin: 12.1 g/dL (ref 12.0–15.0)
Immature Granulocytes: 0 %
Lymphocytes Relative: 11 %
Lymphs Abs: 0.8 10*3/uL (ref 0.7–4.0)
MCH: 30.9 pg (ref 26.0–34.0)
MCHC: 31.9 g/dL (ref 30.0–36.0)
MCV: 96.9 fL (ref 80.0–100.0)
Monocytes Absolute: 0.5 10*3/uL (ref 0.1–1.0)
Monocytes Relative: 8 %
Neutro Abs: 5.7 10*3/uL (ref 1.7–7.7)
Neutrophils Relative %: 80 %
Platelets: 158 10*3/uL (ref 150–400)
RBC: 3.91 MIL/uL (ref 3.87–5.11)
RDW: 13.9 % (ref 11.5–15.5)
WBC: 7.1 10*3/uL (ref 4.0–10.5)
nRBC: 0 % (ref 0.0–0.2)

## 2020-11-29 LAB — URINALYSIS, ROUTINE W REFLEX MICROSCOPIC
Bilirubin Urine: NEGATIVE
Glucose, UA: NEGATIVE mg/dL
Hgb urine dipstick: NEGATIVE
Ketones, ur: NEGATIVE mg/dL
Leukocytes,Ua: NEGATIVE
Nitrite: NEGATIVE
Protein, ur: NEGATIVE mg/dL
Specific Gravity, Urine: 1.012 (ref 1.005–1.030)
pH: 7 (ref 5.0–8.0)

## 2020-11-29 LAB — BRAIN NATRIURETIC PEPTIDE: B Natriuretic Peptide: 446.3 pg/mL — ABNORMAL HIGH (ref 0.0–100.0)

## 2020-11-29 LAB — BASIC METABOLIC PANEL
Anion gap: 7 (ref 5–15)
BUN: 25 mg/dL — ABNORMAL HIGH (ref 8–23)
CO2: 27 mmol/L (ref 22–32)
Calcium: 8.8 mg/dL — ABNORMAL LOW (ref 8.9–10.3)
Chloride: 103 mmol/L (ref 98–111)
Creatinine, Ser: 1.2 mg/dL — ABNORMAL HIGH (ref 0.44–1.00)
GFR, Estimated: 42 mL/min — ABNORMAL LOW (ref >60)
Glucose, Bld: 100 mg/dL — ABNORMAL HIGH (ref 70–99)
Potassium: 4.2 mmol/L (ref 3.5–5.1)
Sodium: 137 mmol/L (ref 135–145)

## 2020-11-29 LAB — TROPONIN I (HIGH SENSITIVITY)
Troponin I (High Sensitivity): 33 ng/L — ABNORMAL HIGH (ref <18)
Troponin I (High Sensitivity): 28 ng/L — ABNORMAL HIGH (ref <18)

## 2020-11-29 NOTE — ED Provider Notes (Signed)
[facility] EMERGENCY DEPARTMENT Provider Note   CSN: [id number] Arrival date & time: 11/29/20  1057     History Chief Complaint  Patient presents with  . Back Pain    [name] is a 87 y.o. female at the bedside for multiple episodes of shortness of breath and lightheadedness with change of position today.  Patient was seen in the ER yesterday for stroke work-up with a normal MRI.  Additionally patient endorses when she is experiencing shortness of breath she experiences significant pain between her shoulder blades.  Patient was diagnosed yesterday with carotid aneurysm.  Has not had any falls today. According to patient's daughter who is at the bedside, the patient lives alone but has in-home nursing that comes to see her daily.  Today found her blood pressure to be 112/60, which the daughter reports is significantly lower than normal for her. Nurse encouraged her to call EMS at home due to patient's shortness of breath and lightheadedness.  I personally reviewed this patient's medical records.  She has history of hypothyroidism, hypertension on amlodipine and Imdur and Lasix, COPD chronically on prednisone, and duodenal AVM.   HPI     Past Medical History:  Diagnosis Date  . Anxiety   . Aortic arch atherosclerosis ([place]) 06/24/2014  . Atherosclerotic ulcer of aorta ([place]) 06/24/2014  . Carotid artery occlusion   . Chronic kidney disease   . COPD (chronic obstructive pulmonary disease) ([place])   . [place] DISEASE, LUMBAR 12/16/2008  . DIVERTICULOSIS, COLON 09/30/2008  . DYSPNEA 07/13/2008  . Graves disease   . History of embolic stroke 0/99/8338   Left brain  . HYPERLIPIDEMIA 03/06/2007  . HYPERTENSION 03/06/2007  . HYPOTHYROIDISM 10/13/2007  . OSTEOARTHRITIS 03/06/2007  . Personal history of colonic polyps 09/30/2008  . Stroke ([place])    06/2014           . TOBACCO USE, QUIT 04/12/2009  . WEAKNESS 11/09/2007    Patient Active Problem List   Diagnosis Date Noted   . Acute systolic CHF (congestive heart failure) ([place]) 09/01/2020  . Acute respiratory failure ([place])   . Mild aortic stenosis 08/30/2020  . Elevated troponin 08/30/2020  . Pleural effusion on left 08/30/2020  . Pain in joint of left knee 04/10/2020  . D-dimer, elevated 01/14/2020  . Elevated brain natriuretic peptide (BNP) level 01/14/2020  . Advice given about COVID-19 virus infection 07/21/2019  . Current chronic use of systemic steroids 07/13/2019  . Medication management 04/02/2018  . COPD exacerbation ([place]) 03/31/2018  . Hypokalemia 03/30/2018  . COPD with acute exacerbation ([place]) 03/02/2018  . SOB (shortness of breath) 01/12/2018  . CKD (chronic kidney disease), stage III 01/12/2018  . New onset left bundle branch block (LBBB) 01/12/2018  . COPD GOLD I D 01/12/2018  . Neurogenic claudication due to lumbar spinal stenosis 09/13/2017  . Symptomatic anemia   . AVM (arteriovenous malformation) of duodenum, acquired with hemorrhage   . Acute GI bleeding 10/09/2016  . COPD with emphysema ([place]) 05/10/2016  . Recurrent laryngeal neuropathy 02/01/2016  . Nausea with vomiting 12/11/2015  . Left carotid stenosis 11/21/2015  . Asymptomatic carotid artery stenosis 11/13/2015  . Impaired glucose tolerance 10/27/2015  . Solitary pulmonary nodule 10/27/2015  . Thyroid nodule 10/27/2015  . Syncope 10/04/2015  . Bradycardia with less than 60 beats per minute 10/04/2015  . History of embolic stroke 25/11/3974  . Paresthesia of both feet 07/06/2014  . Aortic arch atherosclerosis ([place]) 06/24/2014  . Atherosclerotic ulcer of  aorta ([place]) 06/24/2014  . Stroke ([place]) 06/22/2014  . Bilateral carotid artery disease ([place]) 05/18/2014  . TOBACCO USE, QUIT 04/12/2009  . [place] DISEASE, LUMBAR 12/16/2008  . DIVERTICULOSIS, COLON 09/30/2008  . Personal history of colonic polyps 09/30/2008  . DYSPNEA 07/13/2008  . Weakness 11/09/2007  . Hypothyroidism 10/13/2007  . Mixed hyperlipidemia 03/06/2007  .  Essential hypertension 03/06/2007  . Osteoarthritis 03/06/2007    Past Surgical History:  Procedure Laterality Date  . CARDIAC CATHETERIZATION    . CATARACT EXTRACTION Bilateral   . CHOLECYSTECTOMY    . ENDARTERECTOMY Left 11/13/2015   Procedure: LEFT CAROTID ARTERY ENDARTERECTOMY;  Surgeon: Conrad Sturtevant, MD;  Location: [facility];  Service: Vascular;  Laterality: Left;  . ESOPHAGOGASTRODUODENOSCOPY (EGD) WITH PROPOFOL N/A 10/11/2016   Procedure: ESOPHAGOGASTRODUODENOSCOPY (EGD) WITH PROPOFOL;  Surgeon: Mauri Pole, MD;  Location: [facility] ENDOSCOPY;  Service: Endoscopy;  Laterality: N/A;  . KNEE SURGERY    . PATCH ANGIOPLASTY Left 11/13/2015   Procedure: WITH 1CM X 6CM  XENOSURE BIOLOGIC PATCH ANGIOPLASTY;  Surgeon: Conrad McDermitt, MD;  Location: [facility];  Service: Vascular;  Laterality: Left;  . TEE WITHOUT CARDIOVERSION N/A 06/24/2014   Procedure: TRANSESOPHAGEAL ECHOCARDIOGRAM (TEE);  Surgeon: Sanda Klein, MD;  Location: [facility] ENDOSCOPY;  Service: Cardiovascular;  Laterality: N/A;     OB History   No obstetric history on file.     Family History  Problem Relation Age of Onset  . Stomach cancer Maternal Grandmother   . Colon cancer Neg Hx     Social History   Tobacco Use  . Smoking status: Former Smoker    Packs/day: 1.50    Years: 29.00    Pack years: 43.50    Types: Cigarettes    Start date: 07/08/1949    Quit date: 07/08/1978    Years since quitting: 42.4  . Smokeless tobacco: Never Used  Vaping Use  . Vaping Use: Never used  Substance Use Topics  . Alcohol use: Yes    Alcohol/week: 1.0 standard drink    Types: 1 Glasses of wine per week    Comment: "red wine"- some nights  . Drug use: No    Home Medications Prior to Admission medications   Medication Sig Start Date End Date Taking? Authorizing Provider  albuterol (PROAIR HFA) 108 (90 Base) MCG/ACT inhaler Inhale 2 puffs into the lungs every 6 (six) hours as needed for wheezing or shortness of breath. 11/13/17  Yes Volanda Napoleon, PA-C  ALPRAZolam (XANAX) 0.5 MG tablet Take 0.5-1 tablets (0.25-0.5 mg total) by mouth 2 (two) times daily as needed for anxiety. 09/05/20  Yes Nafziger, Tommi Rumps, NP  amLODipine (NORVASC) 5 MG tablet Take 1 tablet (5 mg total) by mouth daily. 10/12/20  Yes Josue Hector, MD  atorvastatin (LIPITOR) 80 MG tablet TAKE 1 TABLET BY MOUTH EVERY DAY AT 6PM Patient taking differently: Take 80 mg by mouth daily. 04/20/20  Yes Nafziger, Tommi Rumps, NP  furosemide (LASIX) 20 MG tablet Take 1 tablet (20 mg total) by mouth daily. 09/05/20  Yes Regalado, Belkys A, MD  hydrALAZINE (APRESOLINE) 50 MG tablet Take 1 tablet (50 mg total) by mouth 3 (three) times daily. 09/25/20  Yes Josue Hector, MD  ipratropium-albuterol (DUONEB) 0.5-2.5 (3) MG/3ML SOLN Take 3 mLs by nebulization every 6 (six) hours as needed (copd). 01/16/20  Yes Danford, Suann Larry, MD  isosorbide mononitrate (IMDUR) 30 MG 24 hr tablet Take 1 tablet (30 mg total) by mouth daily. 09/04/20  Yes Regalado,  Belkys A, MD  latanoprost (XALATAN) 0.005 % ophthalmic solution Place 1 drop into both eyes at bedtime.  04/02/19  Yes [provider]  levothyroxine (SYNTHROID) 75 MCG tablet Take 1 tablet (75 mcg total) by mouth daily before breakfast. 07/04/20  Yes Nafziger, Tommi Rumps, NP  pantoprazole (PROTONIX) 40 MG tablet TAKE 1 TABLET BY MOUTH EVERY DAY 11/28/20  Yes Nafziger, Tommi Rumps, NP  potassium chloride SA (KLOR-CON) 20 MEQ tablet Take 1 tablet (20 mEq total) by mouth daily. 09/04/20  Yes Regalado, Belkys A, MD  predniSONE (DELTASONE) 5 MG tablet Take 5 mg by mouth daily with breakfast.   Yes [provider]  Spacer/Aero-Holding Chambers (AEROCHAMBER MV) inhaler Use as instructed Patient taking differently: 1 each by Other route as directed. 11/26/17  Yes Magdalen Spatz, NP  TRELEGY ELLIPTA 100-62.5-25 MCG/INH AEPB INHALE 1 PUFF BY MOUTH EVERY DAY Patient taking differently: Inhale 1 puff into the lungs daily. 08/09/20  Yes Mannam, Praveen, MD     Allergies    Catapres [clonidine hcl], Clonidine, Lisinopril, Labetalol, and Labetalol hcl  Review of Systems   Review of Systems  Constitutional: Positive for appetite change and fatigue. Negative for activity change, chills, diaphoresis, fever and unexpected weight change.  HENT: Negative.   Eyes: Negative for photophobia, pain, redness and visual disturbance.  Respiratory: Positive for chest tightness and shortness of breath. Negative for cough and wheezing.   Cardiovascular: Positive for palpitations. Negative for chest pain and leg swelling.  Gastrointestinal: Negative.   Musculoskeletal: Negative.   Neurological: Positive for weakness and light-headedness. Negative for dizziness, syncope, facial asymmetry and headaches.    Physical Exam Updated Vital Signs BP (!) 147/77 (BP Location: Right Arm)   Pulse 74   Temp 97.9 F (36.6 C) (Oral)   Resp (!) 21   Ht 5\' 6"  (1.676 m)   Wt 55.3 kg   SpO2 97%   BMI 19.68 kg/m   Physical Exam Vitals and nursing note reviewed.  Constitutional:      Appearance: She is normal weight. She is ill-appearing. She is not toxic-appearing.  HENT:     Head: Normocephalic and atraumatic.     Nose: Nose normal.     Mouth/Throat:     Mouth: Mucous membranes are moist.     Pharynx: Oropharynx is clear. Uvula midline. No oropharyngeal exudate, posterior oropharyngeal erythema or uvula swelling.     Tonsils: No tonsillar exudate.  Eyes:     General: Lids are normal. Vision grossly intact. No scleral icterus.       Right eye: No discharge.        Left eye: No discharge.     Extraocular Movements: Extraocular movements intact.     Conjunctiva/sclera: Conjunctivae normal.     Pupils: Pupils are equal, round, and reactive to light.  Neck:     Trachea: Trachea and phonation normal.  Cardiovascular:     Rate and Rhythm: Normal rate. Rhythm irregularly irregular.     Pulses: Normal pulses.     Heart sounds: Murmur heard.   Systolic murmur is  present with a grade of 5/6.     Comments: Frequent PVCs on the monitor Pulmonary:     Effort: Pulmonary effort is normal. Tachypnea and prolonged expiration present. No accessory muscle usage or respiratory distress.     Breath sounds: Examination of the right-lower field reveals decreased breath sounds. Examination of the left-lower field reveals decreased breath sounds and rales. Decreased breath sounds and rales present. No wheezing.  Chest:     Chest wall: No mass, lacerations, deformity, swelling, tenderness, crepitus or edema.  Abdominal:     General: Bowel sounds are normal. There is no distension.     Palpations: Abdomen is soft.     Tenderness: There is no abdominal tenderness. There is no right CVA tenderness, left CVA tenderness, guarding or rebound.  Musculoskeletal:        General: No deformity.     Cervical back: Normal range of motion and neck supple. No edema, rigidity or crepitus. No pain with movement, spinous process tenderness or muscular tenderness.     Right lower leg: No edema.     Left lower leg: No edema.     Comments: Bruising and mild edema as well as injury to the second toe on the right foot, evaluated yesterday at ED visit.  Lymphadenopathy:     Cervical: No cervical adenopathy.  Skin:    General: Skin is warm and dry.     Capillary Refill: Capillary refill takes less than 2 seconds.     Comments: Skin is thin, healing bruising in various stages, patient on chronic prednisone.  Neurological:     General: No focal deficit present.     Mental Status: She is alert and oriented to person, place, and time. Mental status is at baseline.     GCS: GCS eye subscore is 4. GCS verbal subscore is 5. GCS motor subscore is 6.     Cranial Nerves: Cranial nerves are intact.     Sensory: Sensation is intact.     Motor: Motor function is intact.     Coordination: Coordination is intact.     Gait: Gait is intact.     Comments: Walks with walker at baseline. Ambulated  independently with walker without significant change in VS.   Psychiatric:        Mood and Affect: Mood normal.     ED Results / Procedures / Treatments   Labs (all labs ordered are listed, but only abnormal results are displayed) Labs Reviewed  BASIC METABOLIC PANEL - Abnormal; Notable for the following components:      Result Value   Glucose, Bld 100 (*)    BUN 25 (*)    Creatinine, Ser 1.20 (*)    Calcium 8.8 (*)    GFR, Estimated 42 (*)    All other components within normal limits  BRAIN NATRIURETIC PEPTIDE - Abnormal; Notable for the following components:   B Natriuretic Peptide 446.3 (*)    All other components within normal limits  TROPONIN I (HIGH SENSITIVITY) - Abnormal; Notable for the following components:   Troponin I (High Sensitivity) 33 (*)    All other components within normal limits  TROPONIN I (HIGH SENSITIVITY) - Abnormal; Notable for the following components:   Troponin I (High Sensitivity) 28 (*)    All other components within normal limits  CBC WITH DIFFERENTIAL/PLATELET  URINALYSIS, ROUTINE W REFLEX MICROSCOPIC    EKG EKG Interpretation  Date/Time:  Wednesday Nov 29 2020 11:00:09 EDT Ventricular Rate:  66 PR Interval:  160 QRS Duration: 119 QT Interval:  446 QTC Calculation: 468 R Axis:   -68 Text Interpretation: Sinus rhythm Atrial premature complexes Left anterior fascicular block LVH with secondary repolarization abnormality Anterior infarct, old ST elevation suggests acute pericarditis NSR, PAC, STEand STD old Confirmed by Lavenia Atlas [phone]) on 11/29/2020 11:08:42 AM   Radiology MR BRAIN WO CONTRAST  Result Date: 11/28/2020 CLINICAL DATA:  Stroke suspected. Balance issues.  Double vision. Left-sided facial droop. EXAM: MRI HEAD WITHOUT CONTRAST TECHNIQUE: Multiplanar, multiecho pulse sequences of the brain and surrounding structures were obtained without intravenous contrast. COMPARISON:  MRI Nov 21, 2015.  CTA Nov 21, 2015. FINDINGS:  Brain: No acute infarction, hemorrhage, hydrocephalus, or extra-axial collection. Moderate generalized atrophy with ex vacuo ventricular dilation, similar to prior. There are couple small foci of susceptibility artifact in the left frontal lobe, which may represent prior microhemorrhages. Mild for age scattered T2/FLAIR hyperintensities within the white matter, most likely related to chronic microvascular ischemic disease. Suspected approximately 8 mm pituitary cyst. No significant suprasellar extension or mass effect on the optic chiasm. Vascular: Major arterial flow voids are maintained skull base. Dolichoectasia of the left internal carotid artery with aneurysmal dilation of the distal left cavernous segment. Although evaluation is limited on this noncontrast MRI, there is suspected interval enlargement of the aneurysm. Skull and upper cervical spine: Normal marrow signal. Sinuses/Orbits: Clear sinuses.  Unremarkable orbits. Other: Moderate right mastoid effusion. IMPRESSION: 1. No acute infarct. 2. Dolichoectasia of the left internal carotid artery with aneurysmal dilation of the distal left cavernous segment. Although evaluation is limited on this noncontrast MRI, there is suspected interval enlargement of the aneurysm and a CTA or MRA (CTA is preferred to allow direct comparison to 2017 prior) is recommended to further evaluate. 3. 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 While evaluation is limited on this standard noncontrast MRI, the cyst appears similar or decreased in size when comparing to the 2017 prior. No significant suprasellar extension. Pituitary protocol MRI could further evaluate if clinically indicated. 4. Mild chronic microvascular ischemic disease and moderate atrophy. 5. Chronic moderate right mastoid effusion. Electronically Signed   By: Margaretha Sheffield MD   On: 11/28/2020 10:22   DG Chest Portable 1 View  Result Date:  11/29/2020 CLINICAL DATA:  Shortness of breath, mid back pain for 2 hours EXAM: PORTABLE CHEST 1 VIEW COMPARISON:  11/21/2020 FINDINGS: Cardiomegaly. Both lungs are clear. Unchanged small left pleural effusion. IMPRESSION: Cardiomegaly. Unchanged small left pleural effusion. No new airspace opacity. Electronically Signed   By: Eddie Candle M.D.   On: 11/29/2020 13:13   DG Foot Complete Right  Result Date: 11/28/2020 CLINICAL DATA:  Stubbed toe on furniture EXAM: RIGHT FOOT COMPLETE - 3+ VIEW COMPARISON:  None. FINDINGS: There is no evidence of fracture or dislocation. There is no evidence of arthropathy or other focal bone abnormality. IMPRESSION: No acute fracture. Electronically Signed   By: Macy Mis M.D.   On: 11/28/2020 11:43    Procedures Procedures   Medications Ordered in ED Medications - No data to display  ED Course  I have reviewed the triage vital signs and the nursing notes.  Pertinent labs & imaging results that were available during my care of the patient were reviewed by me and considered in my medical decision making (see chart for details).  Clinical Course as of 11/29/20 1811  Wed Nov 29, 2020  1506 Patient reevaluated with improvement in her symptoms,  desiring to be discharged. Will reassess after orthostatic VS are obtained.  [RS]    Clinical Course User Index [RS] Karstyn Birkey, Sharlene Dory   MDM Rules/Calculators/A&P                          87 year old female who presents with concern for shortness of breath, lightheadedness, generalized weakness since this morning.  Seen yesterday for stroke work-up in ED which was negative but did reveal a carotid aneurysm.  The differential diagnosis of weakness includes but is not limited to: [name] Kitchen Neurologic causes: GBS, myasthenia gravis, CVA, MS, ALS, transverse myelitis, spinal cord injury, CVA, botulism . Other causes: ACS, Arrhythmia, syncope, orthostatic hypotension, sepsis, hypoglycemia, electrolyte disturbance,  hypothyroidism, respiratory failure, symptomatic anemia, dehydration, heat injury, polypharmacy, malignancy.  CBC unremarkable, BMP with baseline creatinine of 1.2, without electrolyte derangement.  BNP at baseline and troponin also at baseline x2.  UA reassuring, no appear infectious.  EKG reassuring, at baseline.  Chest x-ray with left pleural effusion not increased significantly from prior.  Patient ambulated in the ED with walker which she uses at baseline, did not require assistance by this provider or nursing tech.  Did experience some shortness of breath at the end of the walk around the pod, however did not experience any drop in her oxygen saturation or significant increase in her heart rate.  There is no evidence of orthostasis when her vital signs were checked in multiple positions.  Patient adamant she wants to be discharged home.  As I see no indication for admission, I feel this is reasonable.  No further work-up warranted in the ER at this time.  While the exact etiology of this patient's symptoms this morning remains unclear, there is not appear to have been an emergent source.  Recommend to follow-up closely with her primary care doctor.  When discussing the case with her daughter, she expresses that the home health nurse voiced concern the patient may have taken a double dose of one of her blood pressure medications this morning accidentally.  Encouraged her daughter to closely monitor the dosages of her mother's medications.  Her daughter, [name], voiced understanding and was agreeable to this plan.  Nika voiced understanding of her medical evaluation and treatment plan.  Each of her questions was answered to her expressed satisfaction.  Strict return precautions were given.  Patient is well-appearing, stable, and appropriate for discharge at this time.  This chart was dictated using voice recognition software, Dragon. Despite the best efforts of this provider to proofread and correct  errors, errors may still occur which can change documentation meaning.   Final Clinical Impression(s) / ED Diagnoses Final diagnoses:  SOB (shortness of breath)    Rx / DC Orders ED Discharge Orders    None       Emeline Darling, PA-C 11/29/20 1811    Lorelle Gibbs, DO 12/02/20 1517

## 2020-11-29 NOTE — ED Triage Notes (Signed)
Pt BIB [facility] from home, pt c/o mid back pain x2 hours, increase pain with  sitting upright, relieved with slight reposition.  Seen here yesterday for stroke symptoms, dx w/cerebral aneurysm.     BP 160/90 HR 72 RR 30 97% RA

## 2020-11-29 NOTE — ED Notes (Signed)
Got patient undressed on the monitor did ekg shown to er provider got patient a warm blanket with call bell in reach

## 2020-11-29 NOTE — ED Notes (Signed)
ED Provider at bedside. 

## 2020-11-29 NOTE — Discharge Instructions (Signed)
You were seen in the ER today for your shortness of breath. Your physical exam, blood work, urine test, chest xray, and EKG were reassuring.  While the exact source of your symptoms this morning remains unclear, does not appear to be any emergent problem at this time. Please follow-up with your primary care doctor return worsening symptoms.

## 2020-11-29 NOTE — ED Notes (Signed)
Family at bedside. 

## 2020-11-29 NOTE — ED Notes (Signed)
Left arm 152/73 (94)  Right arm 169/73 (102) Right Leg 145/78 (100) Left Leg 173/72 (98)

## 2020-11-30 ENCOUNTER — Telehealth: Payer: Self-pay | Admitting: Adult Health

## 2020-11-30 ENCOUNTER — Telehealth: Payer: Self-pay

## 2020-11-30 DIAGNOSIS — D631 Anemia in chronic kidney disease: Secondary | ICD-10-CM | POA: Diagnosis not present

## 2020-11-30 DIAGNOSIS — I5022 Chronic systolic (congestive) heart failure: Secondary | ICD-10-CM | POA: Diagnosis not present

## 2020-11-30 DIAGNOSIS — I671 Cerebral aneurysm, nonruptured: Secondary | ICD-10-CM

## 2020-11-30 DIAGNOSIS — E876 Hypokalemia: Secondary | ICD-10-CM | POA: Diagnosis not present

## 2020-11-30 DIAGNOSIS — F419 Anxiety disorder, unspecified: Secondary | ICD-10-CM

## 2020-11-30 DIAGNOSIS — I447 Left bundle-branch block, unspecified: Secondary | ICD-10-CM | POA: Diagnosis not present

## 2020-11-30 DIAGNOSIS — I700 Atherosclerosis of aorta: Secondary | ICD-10-CM | POA: Diagnosis not present

## 2020-11-30 DIAGNOSIS — I129 Hypertensive chronic kidney disease with stage 1 through stage 4 chronic kidney disease, or unspecified chronic kidney disease: Secondary | ICD-10-CM | POA: Diagnosis not present

## 2020-11-30 DIAGNOSIS — N183 Chronic kidney disease, stage 3 unspecified: Secondary | ICD-10-CM | POA: Diagnosis not present

## 2020-11-30 DIAGNOSIS — E782 Mixed hyperlipidemia: Secondary | ICD-10-CM | POA: Diagnosis not present

## 2020-11-30 MED ORDER — ALPRAZOLAM 0.5 MG PO TABS: 0.5000 mg | ORAL_TABLET | Freq: Two times a day (BID) | ORAL | 2 refills | Status: DC

## 2020-11-30 NOTE — Telephone Encounter (Signed)
Spoke to the patients daughter [name]) who has updated me about the patients frequent ER trips lately.  Patient seems to be having more issues with shortness of breath induced by anxiety which is causing her to have to go to the emergency room.  Currently prescribed Xanax 0.5 mg, 1/2-1 tab for anxiety.  She is only taking this as needed but it does work well for her when she takes it.  Does not make her feel sleepy, fatigue, have any brain fog, or gait instability.  Additionally it was found during one of the ER visits that she has  large Left carotid cerebral aneurysm.  Recommended neurology follow-up.   Refer to neurology for further evaluation of the aneurysm.  I am going to have the patient start taking Xanax twice a day

## 2020-11-30 NOTE — Telephone Encounter (Signed)
Spoke with patient's daughter Ayan and scheduled an in-person Palliative Consult for 12/22/20 @ 9AM.  COVID screening was negative. No pets in home. Patient lives alone.   Consent obtained; updated Outlook/Netsmart/Team List and Epic.   Family is aware they may be receiving a call from NP the day before or day of to confirm appointment.

## 2020-12-01 DIAGNOSIS — D631 Anemia in chronic kidney disease: Secondary | ICD-10-CM | POA: Diagnosis not present

## 2020-12-01 DIAGNOSIS — E876 Hypokalemia: Secondary | ICD-10-CM | POA: Diagnosis not present

## 2020-12-01 DIAGNOSIS — E782 Mixed hyperlipidemia: Secondary | ICD-10-CM | POA: Diagnosis not present

## 2020-12-01 DIAGNOSIS — I129 Hypertensive chronic kidney disease with stage 1 through stage 4 chronic kidney disease, or unspecified chronic kidney disease: Secondary | ICD-10-CM | POA: Diagnosis not present

## 2020-12-01 DIAGNOSIS — I5022 Chronic systolic (congestive) heart failure: Secondary | ICD-10-CM | POA: Diagnosis not present

## 2020-12-01 DIAGNOSIS — I700 Atherosclerosis of aorta: Secondary | ICD-10-CM | POA: Diagnosis not present

## 2020-12-01 DIAGNOSIS — I671 Cerebral aneurysm, nonruptured: Secondary | ICD-10-CM | POA: Diagnosis not present

## 2020-12-01 DIAGNOSIS — I447 Left bundle-branch block, unspecified: Secondary | ICD-10-CM | POA: Diagnosis not present

## 2020-12-01 DIAGNOSIS — N183 Chronic kidney disease, stage 3 unspecified: Secondary | ICD-10-CM | POA: Diagnosis not present

## 2020-12-04 NOTE — Progress Notes (Signed)
Cardiology Office Note   Date:  12/07/2020   ID:  [name], DOB Sep 06, 1924, MRN [id number]  PCP:  Shirline Frees, NP  Cardiologist:  Dr. Eden Emms, MD   Chief Complaint  Patient presents with  . Follow-up   History of Present Illness: [name] is a 87 y.o. female who presents for follow up, seen for Dr. Eden Emms.   [name] has a hx of atrial atrial fibrillation on AC, labile HTN (intolerant to labetalol and clonidine), carotid artery disease s/p L CEA 2017, HTN, HLD, hypothyroid, CVA 2015, CKD III, OA w/ sciatica, hx GIB secondary to AVM while on Plavix, aortic therosclerosis, COPD, and AS.  She was last seen by Dr. Eden Emms 09/25/20 at which time BP was stable. She has undergone Myoview stress test in 2017 which was considered low risk. Appears that she has had several hospitalizations for COPD exacerbations, now on chronic steroids. She has also been seen on many occasions in the ED 5/13, 5/14, 5/17 and 5/25. Initially she was seen due to inability to urinate and SOB felt to be secondary to UTI and mild fluid volume overload. She was set up for [facility] and new palliative consult. On 11/28/20 she was seen for right leg weakness with visual changes. MRI negative for acute change but found to have an aneurysm>>neurology recommended close outpatient follow up however on phone follow up, sounds like this has been present since 2017.   They come today with a myriad of issues however mostly in regards to recent ED visit and issues with intermittent hand grasping which they are asking if symptoms could be from TIA. I have requested they follow with neurology as previously planned. They are asking about the amlodipine which she was recently started on by her PCP. They were told to hold the medication if pre-med BP <140 systolic. I have requested that they take the medicine as it was prescribed as this will allow more consistency in her BP control. BP today is 126/60. She denies chest pain,  palpitations, LE edema, PND, dizziness or syncope.    Looks like she has been managed nicely by her primary care NP.  She is now followed by home health for care needs.  Past Medical History:  Diagnosis Date  . Anxiety   . Aortic arch atherosclerosis (HCC) 06/24/2014  . Atherosclerotic ulcer of aorta (HCC) 06/24/2014  . Carotid artery occlusion   . Chronic kidney disease   . COPD (chronic obstructive pulmonary disease) (HCC)   . DISC DISEASE, LUMBAR 12/16/2008  . DIVERTICULOSIS, COLON 09/30/2008  . DYSPNEA 07/13/2008  . Graves disease   . History of embolic stroke 08/26/2014   Left brain  . HYPERLIPIDEMIA 03/06/2007  . HYPERTENSION 03/06/2007  . HYPOTHYROIDISM 10/13/2007  . OSTEOARTHRITIS 03/06/2007  . Personal history of colonic polyps 09/30/2008  . Stroke (HCC)    06/2014           . TOBACCO USE, QUIT 04/12/2009  . WEAKNESS 11/09/2007    Past Surgical History:  Procedure Laterality Date  . CARDIAC CATHETERIZATION    . CATARACT EXTRACTION Bilateral   . CHOLECYSTECTOMY    . ENDARTERECTOMY Left 11/13/2015   Procedure: LEFT CAROTID ARTERY ENDARTERECTOMY;  Surgeon: Fransisco Hertz, MD;  Location: [facility] OR;  Service: Vascular;  Laterality: Left;  . ESOPHAGOGASTRODUODENOSCOPY (EGD) WITH PROPOFOL N/A 10/11/2016   Procedure: ESOPHAGOGASTRODUODENOSCOPY (EGD) WITH PROPOFOL;  Surgeon: Napoleon Form, MD;  Location: [facility] ENDOSCOPY;  Service: Endoscopy;  Laterality: N/A;  .  KNEE SURGERY    . PATCH ANGIOPLASTY Left 11/13/2015   Procedure: WITH 1CM X 6CM  XENOSURE BIOLOGIC PATCH ANGIOPLASTY;  Surgeon: Conrad Rayville, MD;  Location: [facility];  Service: Vascular;  Laterality: Left;  . TEE WITHOUT CARDIOVERSION N/A 06/24/2014   Procedure: TRANSESOPHAGEAL ECHOCARDIOGRAM (TEE);  Surgeon: Sanda Klein, MD;  Location: [facility] ENDOSCOPY;  Service: Cardiovascular;  Laterality: N/A;     Current Outpatient Medications  Medication Sig Dispense Refill  . albuterol (PROAIR HFA) 108 (90 Base) MCG/ACT inhaler Inhale 2 puffs into  the lungs every 6 (six) hours as needed for wheezing or shortness of breath. 8.5 Inhaler 5  . ALPRAZolam (XANAX) 0.5 MG tablet Take 1 tablet (0.5 mg total) by mouth 2 (two) times daily. 60 tablet 2  . doxepin (SINEQUAN) 25 MG capsule Take 25 mg by mouth at bedtime.    [name] Kitchen ipratropium-albuterol (DUONEB) 0.5-2.5 (3) MG/3ML SOLN Take 3 mLs by nebulization every 6 (six) hours as needed (copd). 360 mL 11  . latanoprost (XALATAN) 0.005 % ophthalmic solution Place 1 drop into both eyes at bedtime.     [name] Kitchen levothyroxine (SYNTHROID) 75 MCG tablet Take 1 tablet (75 mcg total) by mouth daily before breakfast. 90 tablet 3  . pantoprazole (PROTONIX) 40 MG tablet TAKE 1 TABLET BY MOUTH EVERY DAY 30 tablet 2  . predniSONE (DELTASONE) 5 MG tablet Take 5 mg by mouth daily with breakfast.    . Spacer/Aero-Holding Chambers (AEROCHAMBER MV) inhaler Use as instructed 1 each 0  . TRELEGY ELLIPTA 100-62.5-25 MCG/INH AEPB INHALE 1 PUFF BY MOUTH EVERY DAY 60 each 3  . amLODipine (NORVASC) 5 MG tablet Take 1 tablet (5 mg total) by mouth daily. 90 tablet 3  . atorvastatin (LIPITOR) 80 MG tablet TAKE 1 TABLET BY MOUTH EVERY DAY AT 6PM 90 tablet 3  . furosemide (LASIX) 20 MG tablet Take 1 tablet (20 mg total) by mouth daily. 90 tablet 3  . hydrALAZINE (APRESOLINE) 50 MG tablet Take 1 tablet (50 mg total) by mouth 3 (three) times daily. 270 tablet 3  . isosorbide mononitrate (IMDUR) 30 MG 24 hr tablet Take 1 tablet (30 mg total) by mouth daily. 90 tablet 3  . potassium chloride SA (KLOR-CON) 20 MEQ tablet Take 1 tablet (20 mEq total) by mouth daily. 90 tablet 3   No current facility-administered medications for this visit.    Allergies:   Catapres [clonidine hcl], Clonidine, Lisinopril, Labetalol, and Labetalol hcl    Social History:  The patient  reports that she quit smoking about 42 years ago. Her smoking use included cigarettes. She started smoking about 71 years ago. She has a 43.50 pack-year smoking history. She has  never used smokeless tobacco. She reports current alcohol use of about 1.0 standard drink of alcohol per week. She reports that she does not use drugs.   Family History:  The patient's family history includes Stomach cancer in her maternal grandmother.    ROS:  Please see the history of present illness. Otherwise, review of systems are positive for none.   All other systems are reviewed and negative.    PHYSICAL EXAM: VS:  BP 126/60   Pulse 77   Ht $R'5\' 6"'gX$  (1.676 m)   Wt 123 lb (55.8 kg)   SpO2 96%   BMI 19.85 kg/m  , BMI Body mass index is 19.85 kg/m.   General: Well developed, well nourished, NAD Neck: Negative for carotid bruits. No JVD Lungs:Clear to ausculation bilaterally. Breathing is unlabored. Cardiovascular:  RRR with S1 S2. + murmur Extremities: No edema. Radial pulses 2+ bilaterally Neuro: Alert and oriented. No focal deficits. No facial asymmetry. MAE spontaneously. Psych: Responds to questions appropriately with normal affect.     EKG:  EKG is not ordered today.  Recent Labs: 08/31/2020: Magnesium 1.9; TSH 3.587 11/28/2020: ALT 17 11/29/2020: B Natriuretic Peptide 446.3; BUN 25; Creatinine, Ser 1.20; Hemoglobin 12.1; Platelets 158; Potassium 4.2; Sodium 137    Lipid Panel    Component Value Date/Time   CHOL 218 (H) 03/24/2019 1539   TRIG 73 03/24/2019 1539   HDL 111 03/24/2019 1539   CHOLHDL 2.0 03/24/2019 1539   CHOLHDL 1.9 01/13/2018 0027   VLDL 6 01/13/2018 0027   LDLCALC 94 03/24/2019 1539   LDLDIRECT 141.7 05/01/2011 1037      Wt Readings from Last 3 Encounters:  12/07/20 123 lb (55.8 kg)  11/29/20 121 lb 14.6 oz (55.3 kg)  11/28/20 122 lb (55.3 kg)    Other studies Reviewed: Additional studies/ records that were reviewed today include:  Review of the above records demonstrates:   Echo:01/2018 Study Conclusions - Left ventricle: The cavity size was normal. There was moderate concentric hypertrophy. Systolic function was normal.  The estimated ejection fraction was in the range of 55% to 60%. Wall motion was normal; there were no regional wall motion abnormalities. - Aortic valve: Valve mobility was mildly restricted. There was mild stenosis. There was trivial regurgitation. Valve area (VTI): 1.53 cm^2. Valve area (Vmean): 1.42 cm^2. - Atrial septum: There was increased thickness of the septum, consistent with lipomatous hypertrophy.  Impressions:  - Normal LV ejection fraction, mild aortic stenosis.  Nm Myocar Multi W/spect W/wall Motion / Ef 10/05/2015   There was no ST segment deviation noted during stress.  Defect 1: There is a medium defect of mild severity present in the mid anteroseptal, mid inferoseptal and apical septal location.  This is a low risk study.  The study is normal.  The left ventricular ejection fraction is normal (55-65%).  Nuclear stress EF: 57%. Low risk stress nuclear study with LBBB-related perfusion artifact, otherwise normal perfusion and normal left ventricular regional and global systolic function.   CTA Neck (10/09/15) Advanced atherosclerotic plaque of the thoracic aorta and distal aortic arch.  50% diameter stenosis proximal right internal carotid artery  Irregular plaque of the distal left common carotid artery with plaque ulceration. Very irregular plaque involving the proximal left internal carotid artery. Critical 85% stenosis of the proximal left internal carotid artery  Mild atherosclerotic disease in the right vertebral artery without significant vertebral artery stenosis.  Heterogeneous thyroid bilaterally with 11 mm right lower pole nodule. Recommend thyroid ultrasound  ASSESSMENT AND PLAN:  1. HTN: -Stable, 126/60 -Continue current regimen   2. Carotid artery disease: -Prior CEA in 2017 -Last duplex 05/2020 with no stenosis -Continue with statin   3. HLD:  -Last LDL, 94 from 03/24/2019 -Managed by PCP   4. Prior  CVA: -Recently seen in the ED for gait instability>MRI with aneurysm however per Dr. Jannifer Franklin this has been present since 2017 -Plan is to follow OP with neurology  5. Aortic stenosis: -Last echo with moderate AS with a mean gradient at 57mmHg -No clinical symptoms of worsening with chest pain or dizziness   4. Cardiomyopathy: -LVEF at 40 to 45%. -Appears euvolemic today  -GDMT limited by multiple med intolerances -Continue hydralazin/nitrates    Current medicines are reviewed at length with the patient today.  The patient does not have  concerns regarding medicines.  The following changes have been made:  no change  Labs/ tests ordered today include: None  No orders of the defined types were placed in this encounter.    Disposition:   FU with Dr. Oval Linsey in 3 months  Signed, Kathyrn Drown, NP  12/07/2020 [facility] [address], [place], [place]  [address] Phone: [phone]; Fax: [phone]

## 2020-12-05 DIAGNOSIS — H35033 Hypertensive retinopathy, bilateral: Secondary | ICD-10-CM | POA: Diagnosis not present

## 2020-12-05 DIAGNOSIS — H353221 Exudative age-related macular degeneration, left eye, with active choroidal neovascularization: Secondary | ICD-10-CM | POA: Diagnosis not present

## 2020-12-05 DIAGNOSIS — H26492 Other secondary cataract, left eye: Secondary | ICD-10-CM | POA: Diagnosis not present

## 2020-12-05 DIAGNOSIS — H353113 Nonexudative age-related macular degeneration, right eye, advanced atrophic without subfoveal involvement: Secondary | ICD-10-CM | POA: Diagnosis not present

## 2020-12-06 ENCOUNTER — Encounter: Payer: Self-pay | Admitting: Adult Health

## 2020-12-06 ENCOUNTER — Telehealth: Payer: Self-pay | Admitting: Neurology

## 2020-12-06 ENCOUNTER — Telehealth: Payer: Self-pay

## 2020-12-06 DIAGNOSIS — E782 Mixed hyperlipidemia: Secondary | ICD-10-CM | POA: Diagnosis not present

## 2020-12-06 DIAGNOSIS — I671 Cerebral aneurysm, nonruptured: Secondary | ICD-10-CM | POA: Diagnosis not present

## 2020-12-06 DIAGNOSIS — E876 Hypokalemia: Secondary | ICD-10-CM | POA: Diagnosis not present

## 2020-12-06 DIAGNOSIS — I129 Hypertensive chronic kidney disease with stage 1 through stage 4 chronic kidney disease, or unspecified chronic kidney disease: Secondary | ICD-10-CM | POA: Diagnosis not present

## 2020-12-06 DIAGNOSIS — I5022 Chronic systolic (congestive) heart failure: Secondary | ICD-10-CM | POA: Diagnosis not present

## 2020-12-06 DIAGNOSIS — D631 Anemia in chronic kidney disease: Secondary | ICD-10-CM | POA: Diagnosis not present

## 2020-12-06 DIAGNOSIS — I447 Left bundle-branch block, unspecified: Secondary | ICD-10-CM | POA: Diagnosis not present

## 2020-12-06 DIAGNOSIS — I700 Atherosclerosis of aorta: Secondary | ICD-10-CM | POA: Diagnosis not present

## 2020-12-06 DIAGNOSIS — N183 Chronic kidney disease, stage 3 unspecified: Secondary | ICD-10-CM | POA: Diagnosis not present

## 2020-12-06 NOTE — Telephone Encounter (Signed)
I called the daughter.  The carotid aneurysm has been documented since 2017, we may have enlarged some over time, the patient does have some chronic renal issues, best to get an MRA of the head to look and compared to prior study.  The patient has not been seen through our office and 5 years, would need another referral if she is to be seen and evaluated.

## 2020-12-06 NOTE — Telephone Encounter (Signed)
Patient's daughter Jose Persia called in today about referral that was put in after most recent hospital visit. Stated a large aneurysm was found in her carotid artery. I offered to set up an appointment with you in July for a consult and she stated she just wanted a call from someone to see if there's anything that can even be done, and if there is any options she will set up an appointment. She expressed concern that due to her mothers age she would not be a candidate for any surgeries and would like advice on how to proceed.

## 2020-12-06 NOTE — Telephone Encounter (Signed)
Spoke to [facility] therapist, yesterday and she stated that pt would benefit from PT. Flor requested PT 1 Wk 6. Ok'd per [employer] Flor advised of update.

## 2020-12-07 ENCOUNTER — Other Ambulatory Visit: Payer: Self-pay

## 2020-12-07 ENCOUNTER — Other Ambulatory Visit: Payer: Self-pay | Admitting: Adult Health

## 2020-12-07 ENCOUNTER — Ambulatory Visit (INDEPENDENT_AMBULATORY_CARE_PROVIDER_SITE_OTHER): Payer: Medicare PPO | Admitting: Cardiology

## 2020-12-07 ENCOUNTER — Telehealth: Payer: Self-pay | Admitting: Adult Health

## 2020-12-07 ENCOUNTER — Encounter: Payer: Self-pay | Admitting: Cardiology

## 2020-12-07 VITALS — BP 126/60 | HR 77 | Ht 66.0 in | Wt 123.0 lb

## 2020-12-07 DIAGNOSIS — R0989 Other specified symptoms and signs involving the circulatory and respiratory systems: Secondary | ICD-10-CM

## 2020-12-07 DIAGNOSIS — I700 Atherosclerosis of aorta: Secondary | ICD-10-CM | POA: Diagnosis not present

## 2020-12-07 DIAGNOSIS — I10 Essential (primary) hypertension: Secondary | ICD-10-CM | POA: Diagnosis not present

## 2020-12-07 DIAGNOSIS — I5040 Unspecified combined systolic (congestive) and diastolic (congestive) heart failure: Secondary | ICD-10-CM

## 2020-12-07 DIAGNOSIS — E782 Mixed hyperlipidemia: Secondary | ICD-10-CM | POA: Diagnosis not present

## 2020-12-07 DIAGNOSIS — I671 Cerebral aneurysm, nonruptured: Secondary | ICD-10-CM | POA: Diagnosis not present

## 2020-12-07 DIAGNOSIS — Z8673 Personal history of transient ischemic attack (TIA), and cerebral infarction without residual deficits: Secondary | ICD-10-CM

## 2020-12-07 DIAGNOSIS — E876 Hypokalemia: Secondary | ICD-10-CM | POA: Diagnosis not present

## 2020-12-07 DIAGNOSIS — N183 Chronic kidney disease, stage 3 unspecified: Secondary | ICD-10-CM | POA: Diagnosis not present

## 2020-12-07 DIAGNOSIS — I350 Nonrheumatic aortic (valve) stenosis: Secondary | ICD-10-CM

## 2020-12-07 DIAGNOSIS — I5022 Chronic systolic (congestive) heart failure: Secondary | ICD-10-CM | POA: Diagnosis not present

## 2020-12-07 DIAGNOSIS — D631 Anemia in chronic kidney disease: Secondary | ICD-10-CM | POA: Diagnosis not present

## 2020-12-07 DIAGNOSIS — I447 Left bundle-branch block, unspecified: Secondary | ICD-10-CM | POA: Diagnosis not present

## 2020-12-07 DIAGNOSIS — I6523 Occlusion and stenosis of bilateral carotid arteries: Secondary | ICD-10-CM | POA: Diagnosis not present

## 2020-12-07 DIAGNOSIS — I129 Hypertensive chronic kidney disease with stage 1 through stage 4 chronic kidney disease, or unspecified chronic kidney disease: Secondary | ICD-10-CM | POA: Diagnosis not present

## 2020-12-07 MED ORDER — AMLODIPINE BESYLATE 5 MG PO TABS: 5.0000 mg | ORAL_TABLET | Freq: Every day | ORAL | 3 refills | Status: DC

## 2020-12-07 MED ORDER — POTASSIUM CHLORIDE CRYS ER 20 MEQ PO TBCR: 20.0000 meq | EXTENDED_RELEASE_TABLET | Freq: Every day | ORAL | 3 refills | Status: DC

## 2020-12-07 MED ORDER — ATORVASTATIN CALCIUM 80 MG PO TABS: ORAL_TABLET | ORAL | 3 refills | Status: DC

## 2020-12-07 MED ORDER — FUROSEMIDE 20 MG PO TABS: 20.0000 mg | ORAL_TABLET | Freq: Every day | ORAL | 3 refills | Status: DC

## 2020-12-07 MED ORDER — ISOSORBIDE MONONITRATE ER 30 MG PO TB24: 30.0000 mg | ORAL_TABLET | Freq: Every day | ORAL | 3 refills | Status: DC

## 2020-12-07 MED ORDER — HYDRALAZINE HCL 50 MG PO TABS: 50.0000 mg | ORAL_TABLET | Freq: Three times a day (TID) | ORAL | 3 refills | Status: DC

## 2020-12-07 NOTE — Telephone Encounter (Signed)
FYI

## 2020-12-07 NOTE — Telephone Encounter (Signed)
FYI:  Morene Antu, w/[facility] is calling to let the provider know that the daughter has decline the social worker stated that she does not think it is needed at this time and will call if there is any changes.

## 2020-12-07 NOTE — Telephone Encounter (Signed)
Noted  

## 2020-12-07 NOTE — Patient Instructions (Addendum)
Medication Instructions:  Your physician recommends that you continue on your current medications as directed. Please refer to the Current Medication list given to you today.  *If you need a refill on your cardiac medications before your next appointment, please call your pharmacy*   Lab Work: NONE If you have labs (blood work) drawn today and your tests are completely normal, you will receive your results only by: [name] Kitchen MyChart Message (if you have MyChart) OR . A paper copy in the mail If you have any lab test that is abnormal or we need to change your treatment, we will call you to review the results.   Testing/Procedures: NONE   Follow-Up: At [facility], you and your health needs are our priority.  As part of our continuing mission to provide you with exceptional heart care, we have created designated Provider Care Teams.  These Care Teams include your primary Cardiologist (physician) and Advanced Practice Providers (APPs -  Physician Assistants and Nurse Practitioners) who all work together to provide you with the care you need, when you need it.  We recommend signing up for the patient portal called "MyChart".  Sign up information is provided on this After Visit Summary.  MyChart is used to connect with patients for Virtual Visits (Telemedicine).  Patients are able to view lab/test results, encounter notes, upcoming appointments, etc.  Non-urgent messages can be sent to your provider as well.   To learn more about what you can do with MyChart, go to [email or web address].    Your next appointment:   1 month(s) AT [place]  The format for your next appointment:   In Person  Provider:   You may see DR. [place] or one of the following Advanced Practice Providers on your designated Care Team:    [place], PA-C  Coletta Memos, FNP

## 2020-12-08 DIAGNOSIS — E782 Mixed hyperlipidemia: Secondary | ICD-10-CM | POA: Diagnosis not present

## 2020-12-08 DIAGNOSIS — D631 Anemia in chronic kidney disease: Secondary | ICD-10-CM | POA: Diagnosis not present

## 2020-12-08 DIAGNOSIS — I5022 Chronic systolic (congestive) heart failure: Secondary | ICD-10-CM | POA: Diagnosis not present

## 2020-12-08 DIAGNOSIS — I447 Left bundle-branch block, unspecified: Secondary | ICD-10-CM | POA: Diagnosis not present

## 2020-12-08 DIAGNOSIS — I671 Cerebral aneurysm, nonruptured: Secondary | ICD-10-CM | POA: Diagnosis not present

## 2020-12-08 DIAGNOSIS — I700 Atherosclerosis of aorta: Secondary | ICD-10-CM | POA: Diagnosis not present

## 2020-12-08 DIAGNOSIS — N183 Chronic kidney disease, stage 3 unspecified: Secondary | ICD-10-CM | POA: Diagnosis not present

## 2020-12-08 DIAGNOSIS — E876 Hypokalemia: Secondary | ICD-10-CM | POA: Diagnosis not present

## 2020-12-08 DIAGNOSIS — I129 Hypertensive chronic kidney disease with stage 1 through stage 4 chronic kidney disease, or unspecified chronic kidney disease: Secondary | ICD-10-CM | POA: Diagnosis not present

## 2020-12-12 DIAGNOSIS — E876 Hypokalemia: Secondary | ICD-10-CM | POA: Diagnosis not present

## 2020-12-12 DIAGNOSIS — I129 Hypertensive chronic kidney disease with stage 1 through stage 4 chronic kidney disease, or unspecified chronic kidney disease: Secondary | ICD-10-CM | POA: Diagnosis not present

## 2020-12-12 DIAGNOSIS — E782 Mixed hyperlipidemia: Secondary | ICD-10-CM | POA: Diagnosis not present

## 2020-12-12 DIAGNOSIS — I700 Atherosclerosis of aorta: Secondary | ICD-10-CM | POA: Diagnosis not present

## 2020-12-12 DIAGNOSIS — D631 Anemia in chronic kidney disease: Secondary | ICD-10-CM | POA: Diagnosis not present

## 2020-12-12 DIAGNOSIS — N183 Chronic kidney disease, stage 3 unspecified: Secondary | ICD-10-CM | POA: Diagnosis not present

## 2020-12-12 DIAGNOSIS — I5022 Chronic systolic (congestive) heart failure: Secondary | ICD-10-CM | POA: Diagnosis not present

## 2020-12-12 DIAGNOSIS — I447 Left bundle-branch block, unspecified: Secondary | ICD-10-CM | POA: Diagnosis not present

## 2020-12-12 DIAGNOSIS — I671 Cerebral aneurysm, nonruptured: Secondary | ICD-10-CM | POA: Diagnosis not present

## 2020-12-14 DIAGNOSIS — N183 Chronic kidney disease, stage 3 unspecified: Secondary | ICD-10-CM | POA: Diagnosis not present

## 2020-12-14 DIAGNOSIS — E876 Hypokalemia: Secondary | ICD-10-CM | POA: Diagnosis not present

## 2020-12-14 DIAGNOSIS — I700 Atherosclerosis of aorta: Secondary | ICD-10-CM | POA: Diagnosis not present

## 2020-12-14 DIAGNOSIS — E782 Mixed hyperlipidemia: Secondary | ICD-10-CM | POA: Diagnosis not present

## 2020-12-14 DIAGNOSIS — I129 Hypertensive chronic kidney disease with stage 1 through stage 4 chronic kidney disease, or unspecified chronic kidney disease: Secondary | ICD-10-CM | POA: Diagnosis not present

## 2020-12-14 DIAGNOSIS — I447 Left bundle-branch block, unspecified: Secondary | ICD-10-CM | POA: Diagnosis not present

## 2020-12-14 DIAGNOSIS — I671 Cerebral aneurysm, nonruptured: Secondary | ICD-10-CM | POA: Diagnosis not present

## 2020-12-14 DIAGNOSIS — I5022 Chronic systolic (congestive) heart failure: Secondary | ICD-10-CM | POA: Diagnosis not present

## 2020-12-14 DIAGNOSIS — D631 Anemia in chronic kidney disease: Secondary | ICD-10-CM | POA: Diagnosis not present

## 2020-12-15 DIAGNOSIS — E782 Mixed hyperlipidemia: Secondary | ICD-10-CM | POA: Diagnosis not present

## 2020-12-15 DIAGNOSIS — I129 Hypertensive chronic kidney disease with stage 1 through stage 4 chronic kidney disease, or unspecified chronic kidney disease: Secondary | ICD-10-CM | POA: Diagnosis not present

## 2020-12-15 DIAGNOSIS — I447 Left bundle-branch block, unspecified: Secondary | ICD-10-CM | POA: Diagnosis not present

## 2020-12-15 DIAGNOSIS — E876 Hypokalemia: Secondary | ICD-10-CM | POA: Diagnosis not present

## 2020-12-15 DIAGNOSIS — D631 Anemia in chronic kidney disease: Secondary | ICD-10-CM | POA: Diagnosis not present

## 2020-12-15 DIAGNOSIS — I671 Cerebral aneurysm, nonruptured: Secondary | ICD-10-CM | POA: Diagnosis not present

## 2020-12-15 DIAGNOSIS — I700 Atherosclerosis of aorta: Secondary | ICD-10-CM | POA: Diagnosis not present

## 2020-12-15 DIAGNOSIS — N183 Chronic kidney disease, stage 3 unspecified: Secondary | ICD-10-CM | POA: Diagnosis not present

## 2020-12-15 DIAGNOSIS — I5022 Chronic systolic (congestive) heart failure: Secondary | ICD-10-CM | POA: Diagnosis not present

## 2020-12-18 DIAGNOSIS — I5022 Chronic systolic (congestive) heart failure: Secondary | ICD-10-CM | POA: Diagnosis not present

## 2020-12-18 DIAGNOSIS — E876 Hypokalemia: Secondary | ICD-10-CM | POA: Diagnosis not present

## 2020-12-18 DIAGNOSIS — D631 Anemia in chronic kidney disease: Secondary | ICD-10-CM | POA: Diagnosis not present

## 2020-12-18 DIAGNOSIS — I129 Hypertensive chronic kidney disease with stage 1 through stage 4 chronic kidney disease, or unspecified chronic kidney disease: Secondary | ICD-10-CM | POA: Diagnosis not present

## 2020-12-18 DIAGNOSIS — N183 Chronic kidney disease, stage 3 unspecified: Secondary | ICD-10-CM | POA: Diagnosis not present

## 2020-12-18 DIAGNOSIS — I700 Atherosclerosis of aorta: Secondary | ICD-10-CM | POA: Diagnosis not present

## 2020-12-18 DIAGNOSIS — E782 Mixed hyperlipidemia: Secondary | ICD-10-CM | POA: Diagnosis not present

## 2020-12-18 DIAGNOSIS — I447 Left bundle-branch block, unspecified: Secondary | ICD-10-CM | POA: Diagnosis not present

## 2020-12-18 DIAGNOSIS — I671 Cerebral aneurysm, nonruptured: Secondary | ICD-10-CM | POA: Diagnosis not present

## 2020-12-19 DIAGNOSIS — I700 Atherosclerosis of aorta: Secondary | ICD-10-CM | POA: Diagnosis not present

## 2020-12-19 DIAGNOSIS — N183 Chronic kidney disease, stage 3 unspecified: Secondary | ICD-10-CM | POA: Diagnosis not present

## 2020-12-19 DIAGNOSIS — I671 Cerebral aneurysm, nonruptured: Secondary | ICD-10-CM | POA: Diagnosis not present

## 2020-12-19 DIAGNOSIS — I447 Left bundle-branch block, unspecified: Secondary | ICD-10-CM | POA: Diagnosis not present

## 2020-12-19 DIAGNOSIS — I129 Hypertensive chronic kidney disease with stage 1 through stage 4 chronic kidney disease, or unspecified chronic kidney disease: Secondary | ICD-10-CM | POA: Diagnosis not present

## 2020-12-19 DIAGNOSIS — E782 Mixed hyperlipidemia: Secondary | ICD-10-CM | POA: Diagnosis not present

## 2020-12-19 DIAGNOSIS — D631 Anemia in chronic kidney disease: Secondary | ICD-10-CM | POA: Diagnosis not present

## 2020-12-19 DIAGNOSIS — I5022 Chronic systolic (congestive) heart failure: Secondary | ICD-10-CM | POA: Diagnosis not present

## 2020-12-19 DIAGNOSIS — E876 Hypokalemia: Secondary | ICD-10-CM | POA: Diagnosis not present

## 2020-12-20 DIAGNOSIS — E782 Mixed hyperlipidemia: Secondary | ICD-10-CM | POA: Diagnosis not present

## 2020-12-20 DIAGNOSIS — I700 Atherosclerosis of aorta: Secondary | ICD-10-CM | POA: Diagnosis not present

## 2020-12-20 DIAGNOSIS — E876 Hypokalemia: Secondary | ICD-10-CM | POA: Diagnosis not present

## 2020-12-20 DIAGNOSIS — D631 Anemia in chronic kidney disease: Secondary | ICD-10-CM | POA: Diagnosis not present

## 2020-12-20 DIAGNOSIS — I5022 Chronic systolic (congestive) heart failure: Secondary | ICD-10-CM | POA: Diagnosis not present

## 2020-12-20 DIAGNOSIS — I447 Left bundle-branch block, unspecified: Secondary | ICD-10-CM | POA: Diagnosis not present

## 2020-12-20 DIAGNOSIS — N183 Chronic kidney disease, stage 3 unspecified: Secondary | ICD-10-CM | POA: Diagnosis not present

## 2020-12-20 DIAGNOSIS — I129 Hypertensive chronic kidney disease with stage 1 through stage 4 chronic kidney disease, or unspecified chronic kidney disease: Secondary | ICD-10-CM | POA: Diagnosis not present

## 2020-12-20 DIAGNOSIS — I671 Cerebral aneurysm, nonruptured: Secondary | ICD-10-CM | POA: Diagnosis not present

## 2020-12-22 ENCOUNTER — Other Ambulatory Visit: Payer: Self-pay | Admitting: Pulmonary Disease

## 2020-12-22 ENCOUNTER — Other Ambulatory Visit: Payer: Self-pay

## 2020-12-22 ENCOUNTER — Other Ambulatory Visit: Payer: Medicare PPO | Admitting: Nurse Practitioner

## 2020-12-22 VITALS — BP 142/60 | HR 61 | Resp 18 | Ht 66.0 in | Wt 122.0 lb

## 2020-12-22 DIAGNOSIS — Z515 Encounter for palliative care: Secondary | ICD-10-CM

## 2020-12-22 DIAGNOSIS — R2681 Unsteadiness on feet: Secondary | ICD-10-CM

## 2020-12-22 DIAGNOSIS — J449 Chronic obstructive pulmonary disease, unspecified: Secondary | ICD-10-CM | POA: Diagnosis not present

## 2020-12-22 NOTE — Progress Notes (Signed)
[facility] Palliative Care Consult Note Telephone: [phone]  Fax: [phone]    Date of encounter: 12/22/20 PATIENT NAME: [name] [place] [id number]   [phone] (home)  DOB: 1925/02/28 MRN: [id number]  PRIMARY CARE PROVIDER:    [name], NP,  [place] [address] [phone]  REFERRING PROVIDER:   [name], NP [place],  [place] [address] [phone]  RESPONSIBLE PARTY:    Contact Information     Name Relation Home Work [place] Daughter  [phone]   [place] Daughter [phone]  [phone]     I met face to face with patient in home.  Palliative Care was asked to follow this patient by consultation request of  [name], Gloria Rumps, NP to address advance care planning and complex medical decision making. This is the initial visit.                                   ASSESSMENT AND PLAN / RECOMMENDATIONS:   Advance Care Planning/Goals of Care: Goals include to maximize quality of life and symptom management.  Visit consisted of building trust and discussions on Palliative care medicine as a specialized medical care for people living with serious illness, aimed at facilitating improved quality of life through symptoms relief, assisting with advance care planning and establishing goals of care. Patient expressed appreciation for education provided on Palliative care and how it differs from Hospice service. Our advance care planning conversation included a dVisit consisted of building trust and discussions on Palliative care medicine as specialized medical care for people living with serious illness, aimed at facilitating improved quality of life through symptoms relief, assisting with advance care planning and establishing goals of care. Family expressed appreciation for education provided on Palliative care and how it differs from  Hospice service.iscussion about:    The value and importance of advance care planning  Experiences with loved ones who have been seriously ill or have died  Exploration of personal, cultural or spiritual beliefs that might influence medical decisions  Exploration of goals of care in the event of a sudden injury or illness  Review and updating or creation of an  advance directive document . Decision not to resuscitate  CODE STATUS: DNR Goals of care: Patient's goal of care is comfort and to remain in her home through end of life. She verbalized that she desire to be as functional as possible.  Directive: Patient decided to not be resuscitated in the event of cardiac or respiratory arrest saying she had led a good life and want to pass on peacefully when it is her time. Validation provided. DNR form signed for patient today, advised to keep form in a readily accessible area in her home for easy assess if needed. Discussed need to complete a form. Discussed and reviewed sections of the form in detail. Patient verbalized interest in completing the form, she however want to discuss it with her children first. Blank MOST form and information leaflet on MOST form left with patient, we will complete at next visit. Palliative care will continue to provide support to patient, family and the medical team.   I spent 48 minutes providing this consultation. More than 50% of the time in this consultation was spent in counseling and care coordination  Symptom Management/Plan: Unsteady gait: Continue PT/OT. Maintain safety. Encourage consistent use of  assistive device for ambulation. COPD: no evidence of acute exacerbation. Continue current plan of care with Trelegy and Albuterol neb as needed. Anxiety: Stable. Continue Xanax as needed.  Provided general support and encouragement. Questions and concerns were addressed. Patient was encouraged to call with questions and/or concerns. Visit update discussed with  patient's daughter [name]. She voiced no concerns for patient.  PPS: 50%  HOSPICE ELIGIBILITY/DIAGNOSIS: TBD  Chief Complaint: Generalized weakness  History obtained from review of EMR, discussion with primary team, and interview with family, facility staff/caregiver and/or [name].  HISTORY OF PRESENT ILLNESS:  [name] is a 87 y.o. year old female with multiple medical problems significant for COPD, hypertension, CAD, CHF (EF 45%), chronic pain due to lumbar spinal stenosis, mild aortic stenosis, macular degeneration, hypothyroidism, hx of CVA with right hemiplegia. Patient with frequent emergency room visits, 10 in the last 6 months, with 5 being in the last one month. Most of her ED visits are related to COPD exacerbation, last ED visit was 3 week ago. Patient reported to have anxiety attacks related to respiratory distress and was started on Xanax PRN. Patient report no ED visit since she started taking Xanax as needed. She denied any concerns today. Denied any uncontrolled pain, denied insomnia, report good appetite, denied falls. Denied fever or chills. She lives at home alone, her 4 children lives close by and are very involved in her care. She also has a paid caregiver that comes 2 times a week to assist her with her ADLs.   I reviewed available labs, medications, imaging, studies and related documents from the EMR.  Records reviewed and summarized above.   ROS General: NAD EYES: denies vision changes ENMT: denies dysphagia Cardiovascular: denies chest pain, denies DOE Pulmonary: denies cough, denies increased SOB Abdomen: endorses good appetite, denies constipation, endorses continence of bowel GU: denies dysuria, endorses continence of urine MSK:  endorsed weakness, no falls reported Skin: denies rashes or wounds Neurological: denies uncontrolled pain, denies insomnia Psych: Endorses positive mood Heme/lymph/immuno: denies bruises, abnormal bleeding  Vitals with BMI  12/22/2020 12/07/2020 11/29/2020  Height $Remov'5\' 6"'kvBhvH$  $Remove'5\' 6"'MOgDRdH$  -  Weight 122 lbs 123 lbs -  BMI 95.2 84.13 -  Systolic 244 010 272  Diastolic 60 60 77  Pulse 61 77 74    Physical Exam: General: frail appearing, thin, siting in recliner in NAD actively participated in visit discussion  EYES: anicteric sclera, no discharge  ENMT: intact hearing, oral mucous membranes moist CV: S1S2 normal, no LE edema Pulmonary: LCTA, no increased work of breathing, no cough, room air Abdomen: no ascites GU: deferred MSK: no sarcopenia, moves all extremities, ambulatory Skin: warm and dry, no rashes or wounds on visible skin, skin thin and fragile Neuro: generalized weakness,  o cognitive impairment Psych: non-anxious affect, A and O x 4 Hem/lymph/immuno: no widespread bruising  CURRENT PROBLEM LIST:  Patient Active Problem List   Diagnosis Date Noted   Acute systolic CHF (congestive heart failure) (HCC) 09/01/2020   Acute respiratory failure (HCC)    Mild aortic stenosis 08/30/2020   Elevated troponin 08/30/2020   Pleural effusion on left 08/30/2020   Pain in joint of left knee 04/10/2020   D-dimer, elevated 01/14/2020   Elevated brain natriuretic peptide (BNP) level 01/14/2020   Advice given about COVID-19 virus infection 07/21/2019   Current chronic use of systemic steroids 07/13/2019   Medication management 04/02/2018   COPD exacerbation ([place]) 03/31/2018   Hypokalemia 03/30/2018   COPD with acute exacerbation (  [place]) 03/02/2018   SOB (shortness of breath) 01/12/2018   CKD (chronic kidney disease), stage III 01/12/2018   New onset left bundle branch block (LBBB) 01/12/2018   COPD GOLD I D 01/12/2018   Neurogenic claudication due to lumbar spinal stenosis 09/13/2017   Symptomatic anemia    AVM (arteriovenous malformation) of duodenum, acquired with hemorrhage    Acute GI bleeding 10/09/2016   COPD with emphysema ([place]) 05/10/2016   Recurrent laryngeal neuropathy 02/01/2016   Nausea with vomiting  12/11/2015   Left carotid stenosis 11/21/2015   Asymptomatic carotid artery stenosis 11/13/2015   Impaired glucose tolerance 10/27/2015   Solitary pulmonary nodule 10/27/2015   Thyroid nodule 10/27/2015   Syncope 10/04/2015   Bradycardia with less than 60 beats per minute 10/04/2015   History of embolic stroke 76/28/3151   Paresthesia of both feet 07/06/2014   Aortic arch atherosclerosis ([place]) 06/24/2014   Atherosclerotic ulcer of aorta ([place]) 06/24/2014   Stroke ([place]) 06/22/2014   Bilateral carotid artery disease ([place]) 05/18/2014   TOBACCO USE, QUIT 04/12/2009   [place] DISEASE, LUMBAR 12/16/2008   DIVERTICULOSIS, COLON 09/30/2008   Personal history of colonic polyps 09/30/2008   DYSPNEA 07/13/2008   Weakness 11/09/2007   Hypothyroidism 10/13/2007   Mixed hyperlipidemia 03/06/2007   Essential hypertension 03/06/2007   Osteoarthritis 03/06/2007   PAST MEDICAL HISTORY:  Active Ambulatory Problems    Diagnosis Date Noted   Hypothyroidism 10/13/2007   Mixed hyperlipidemia 03/06/2007   Essential hypertension 03/06/2007   DIVERTICULOSIS, COLON 09/30/2008   Osteoarthritis 03/06/2007   [place] DISEASE, LUMBAR 12/16/2008   Weakness 11/09/2007   DYSPNEA 07/13/2008   Personal history of colonic polyps 09/30/2008   TOBACCO USE, QUIT 04/12/2009   Bilateral carotid artery disease ([place]) 05/18/2014   Stroke ([place]) 06/22/2014   Aortic arch atherosclerosis ([place]) 06/24/2014   Atherosclerotic ulcer of aorta ([place]) 06/24/2014   Paresthesia of both feet 07/06/2014   History of embolic stroke 76/16/0737   Syncope 10/04/2015   Bradycardia with less than 60 beats per minute 10/04/2015   Impaired glucose tolerance 10/27/2015   Solitary pulmonary nodule 10/27/2015   Thyroid nodule 10/27/2015   Asymptomatic carotid artery stenosis 11/13/2015   Left carotid stenosis 11/21/2015   Nausea with vomiting 12/11/2015   Recurrent laryngeal neuropathy 02/01/2016   COPD with emphysema ([place]) 05/10/2016   Acute  GI bleeding 10/09/2016   Symptomatic anemia    AVM (arteriovenous malformation) of duodenum, acquired with hemorrhage    Neurogenic claudication due to lumbar spinal stenosis 09/13/2017   SOB (shortness of breath) 01/12/2018   CKD (chronic kidney disease), stage III 01/12/2018   New onset left bundle branch block (LBBB) 01/12/2018   COPD GOLD I D 01/12/2018   COPD with acute exacerbation ([place]) 03/02/2018   Hypokalemia 03/30/2018   COPD exacerbation ([place]) 03/31/2018   Medication management 04/02/2018   Current chronic use of systemic steroids 07/13/2019   Advice given about COVID-19 virus infection 07/21/2019   D-dimer, elevated 01/14/2020   Elevated brain natriuretic peptide (BNP) level 01/14/2020   Pain in joint of left knee 04/10/2020   Mild aortic stenosis 08/30/2020   Elevated troponin 08/30/2020   Pleural effusion on left 08/30/2020   Acute respiratory failure (HCC)    Acute systolic CHF (congestive heart failure) ([place]) 09/01/2020   Resolved Ambulatory Problems    Diagnosis Date Noted   ARM PAIN, RIGHT 10/04/2009   WOUND, OPEN, LEG, WITHOUT COMPLICATION 10/62/6948   Stroke (cerebrum) ([place]) 06/22/2014   Paresthesia 10/03/2014   HLD (  hyperlipidemia) 01/12/2018   New onset atrial fibrillation ([place]) 08/30/2020   Atrial fibrillation ([place]) 09/01/2020   Past Medical History:  Diagnosis Date   Anxiety    Carotid artery occlusion    Chronic kidney disease    [place] disease    HYPERLIPIDEMIA 03/06/2007   HYPERTENSION 03/06/2007   HYPOTHYROIDISM 10/13/2007   OSTEOARTHRITIS 03/06/2007   WEAKNESS 11/09/2007   SOCIAL HX:  Social History   Tobacco Use   Smoking status: Former    Packs/day: 1.50    Years: 29.00    Pack years: 43.50    Types: Cigarettes    Start date: 07/08/1949    Quit date: 07/08/1978    Years since quitting: 42.4   Smokeless tobacco: Never  Substance Use Topics   Alcohol use: Yes    Alcohol/week: 1.0 standard drink    Types: 1 Glasses of wine per week     Comment: "red wine"- some nights   FAMILY HX:  Family History  Problem Relation Age of Onset   Stomach cancer Maternal Grandmother    Colon cancer Neg Hx      ALLERGIES:  Allergies  Allergen Reactions   Catapres [Clonidine Hcl] Other (See Comments)    Made pt feel horrible, shaky, weak and nausea   Clonidine Other (See Comments)    Made pt feel horrible, shaky, weak and nausea   Lisinopril Anaphylaxis    Tongue swelling   Labetalol Other (See Comments)    Caused bradycardia and syncope   Labetalol Hcl Other (See Comments)    Caused bradycardia and syncope     PERTINENT MEDICATIONS:  Outpatient Encounter Medications as of 12/22/2020  Medication Sig   albuterol (PROAIR HFA) 108 (90 Base) MCG/ACT inhaler Inhale 2 puffs into the lungs every 6 (six) hours as needed for wheezing or shortness of breath.   ALPRAZolam (XANAX) 0.5 MG tablet Take 1 tablet (0.5 mg total) by mouth 2 (two) times daily.   amLODipine (NORVASC) 5 MG tablet Take 1 tablet (5 mg total) by mouth daily.   atorvastatin (LIPITOR) 80 MG tablet TAKE 1 TABLET BY MOUTH EVERY DAY AT 6PM   doxepin (SINEQUAN) 25 MG capsule Take 25 mg by mouth at bedtime.   furosemide (LASIX) 20 MG tablet Take 1 tablet (20 mg total) by mouth daily.   hydrALAZINE (APRESOLINE) 50 MG tablet Take 1 tablet (50 mg total) by mouth 3 (three) times daily.   ipratropium-albuterol (DUONEB) 0.5-2.5 (3) MG/3ML SOLN Take 3 mLs by nebulization every 6 (six) hours as needed (copd).   isosorbide mononitrate (IMDUR) 30 MG 24 hr tablet Take 1 tablet (30 mg total) by mouth daily.   latanoprost (XALATAN) 0.005 % ophthalmic solution Place 1 drop into both eyes at bedtime.    levothyroxine (SYNTHROID) 75 MCG tablet Take 1 tablet (75 mcg total) by mouth daily before breakfast.   pantoprazole (PROTONIX) 40 MG tablet TAKE 1 TABLET BY MOUTH EVERY DAY   potassium chloride SA (KLOR-CON) 20 MEQ tablet Take 1 tablet (20 mEq total) by mouth daily.   predniSONE (DELTASONE) 5  MG tablet Take 5 mg by mouth daily with breakfast.   Spacer/Aero-Holding Chambers (AEROCHAMBER MV) inhaler Use as instructed   TRELEGY ELLIPTA 100-62.5-25 MCG/INH AEPB INHALE 1 PUFF BY MOUTH EVERY DAY   No facility-administered encounter medications on file as of 12/22/2020.   Thank you for the opportunity to participate in the care of Ms. Hartsough.  The palliative care team will continue to follow. Please call our office at  [phone] if we can be of additional assistance.   Jari Favre, DNP, AGPCNP-BC  COVID-19 PATIENT SCREENING TOOL Asked and negative response unless otherwise noted:   Have you had symptoms of covid, tested positive or been in contact with someone with symptoms/positive test in the past 5-10 days?

## 2020-12-22 NOTE — Telephone Encounter (Signed)
Patient requesting refill on Prednisone. I wanted to clarify current dose.  Dr. Vaughan Browner please advise

## 2020-12-23 ENCOUNTER — Inpatient Hospital Stay (HOSPITAL_BASED_OUTPATIENT_CLINIC_OR_DEPARTMENT_OTHER)
Admission: EM | Admit: 2020-12-23 | Discharge: 2020-12-27 | DRG: 065 | Disposition: A | Discharge status: Home Health | Payer: Medicare PPO | Attending: Hospitalist | Admitting: Hospitalist

## 2020-12-23 ENCOUNTER — Other Ambulatory Visit: Payer: Self-pay

## 2020-12-23 ENCOUNTER — Emergency Department (HOSPITAL_BASED_OUTPATIENT_CLINIC_OR_DEPARTMENT_OTHER): Payer: Medicare PPO

## 2020-12-23 ENCOUNTER — Encounter (HOSPITAL_BASED_OUTPATIENT_CLINIC_OR_DEPARTMENT_OTHER): Payer: Self-pay

## 2020-12-23 DIAGNOSIS — R29703 NIHSS score 3: Secondary | ICD-10-CM | POA: Diagnosis present

## 2020-12-23 DIAGNOSIS — R531 Weakness: Secondary | ICD-10-CM

## 2020-12-23 DIAGNOSIS — I671 Cerebral aneurysm, nonruptured: Secondary | ICD-10-CM | POA: Diagnosis present

## 2020-12-23 DIAGNOSIS — E039 Hypothyroidism, unspecified: Secondary | ICD-10-CM | POA: Diagnosis present

## 2020-12-23 DIAGNOSIS — R339 Retention of urine, unspecified: Secondary | ICD-10-CM

## 2020-12-23 DIAGNOSIS — F419 Anxiety disorder, unspecified: Secondary | ICD-10-CM | POA: Diagnosis present

## 2020-12-23 DIAGNOSIS — Z20822 Contact with and (suspected) exposure to covid-19: Secondary | ICD-10-CM | POA: Diagnosis present

## 2020-12-23 DIAGNOSIS — Z66 Do not resuscitate: Secondary | ICD-10-CM | POA: Diagnosis present

## 2020-12-23 DIAGNOSIS — I130 Hypertensive heart and chronic kidney disease with heart failure and stage 1 through stage 4 chronic kidney disease, or unspecified chronic kidney disease: Secondary | ICD-10-CM | POA: Diagnosis present

## 2020-12-23 DIAGNOSIS — Z8673 Personal history of transient ischemic attack (TIA), and cerebral infarction without residual deficits: Secondary | ICD-10-CM | POA: Diagnosis not present

## 2020-12-23 DIAGNOSIS — I6389 Other cerebral infarction: Secondary | ICD-10-CM | POA: Diagnosis not present

## 2020-12-23 DIAGNOSIS — G8191 Hemiplegia, unspecified affecting right dominant side: Secondary | ICD-10-CM | POA: Diagnosis present

## 2020-12-23 DIAGNOSIS — Z7952 Long term (current) use of systemic steroids: Secondary | ICD-10-CM | POA: Diagnosis not present

## 2020-12-23 DIAGNOSIS — R29898 Other symptoms and signs involving the musculoskeletal system: Secondary | ICD-10-CM | POA: Diagnosis present

## 2020-12-23 DIAGNOSIS — I480 Paroxysmal atrial fibrillation: Secondary | ICD-10-CM | POA: Diagnosis present

## 2020-12-23 DIAGNOSIS — M6281 Muscle weakness (generalized): Secondary | ICD-10-CM | POA: Diagnosis not present

## 2020-12-23 DIAGNOSIS — K219 Gastro-esophageal reflux disease without esophagitis: Secondary | ICD-10-CM | POA: Diagnosis present

## 2020-12-23 DIAGNOSIS — Z888 Allergy status to other drugs, medicaments and biological substances status: Secondary | ICD-10-CM

## 2020-12-23 DIAGNOSIS — R29810 Facial weakness: Secondary | ICD-10-CM | POA: Diagnosis present

## 2020-12-23 DIAGNOSIS — I5042 Chronic combined systolic (congestive) and diastolic (congestive) heart failure: Secondary | ICD-10-CM | POA: Diagnosis present

## 2020-12-23 DIAGNOSIS — J449 Chronic obstructive pulmonary disease, unspecified: Secondary | ICD-10-CM | POA: Diagnosis present

## 2020-12-23 DIAGNOSIS — I6330 Cerebral infarction due to thrombosis of unspecified cerebral artery: Secondary | ICD-10-CM | POA: Insufficient documentation

## 2020-12-23 DIAGNOSIS — Z7951 Long term (current) use of inhaled steroids: Secondary | ICD-10-CM

## 2020-12-23 DIAGNOSIS — K5900 Constipation, unspecified: Secondary | ICD-10-CM | POA: Diagnosis present

## 2020-12-23 DIAGNOSIS — I4891 Unspecified atrial fibrillation: Secondary | ICD-10-CM

## 2020-12-23 DIAGNOSIS — Z87891 Personal history of nicotine dependence: Secondary | ICD-10-CM

## 2020-12-23 DIAGNOSIS — E785 Hyperlipidemia, unspecified: Secondary | ICD-10-CM | POA: Diagnosis present

## 2020-12-23 DIAGNOSIS — G4733 Obstructive sleep apnea (adult) (pediatric): Secondary | ICD-10-CM | POA: Diagnosis present

## 2020-12-23 DIAGNOSIS — N1832 Chronic kidney disease, stage 3b: Secondary | ICD-10-CM | POA: Diagnosis present

## 2020-12-23 DIAGNOSIS — E1122 Type 2 diabetes mellitus with diabetic chronic kidney disease: Secondary | ICD-10-CM | POA: Diagnosis present

## 2020-12-23 DIAGNOSIS — Z79890 Hormone replacement therapy (postmenopausal): Secondary | ICD-10-CM

## 2020-12-23 DIAGNOSIS — I6521 Occlusion and stenosis of right carotid artery: Secondary | ICD-10-CM | POA: Diagnosis not present

## 2020-12-23 DIAGNOSIS — R471 Dysarthria and anarthria: Secondary | ICD-10-CM | POA: Diagnosis present

## 2020-12-23 DIAGNOSIS — Z79899 Other long term (current) drug therapy: Secondary | ICD-10-CM

## 2020-12-23 DIAGNOSIS — I639 Cerebral infarction, unspecified: Secondary | ICD-10-CM | POA: Diagnosis not present

## 2020-12-23 DIAGNOSIS — E1165 Type 2 diabetes mellitus with hyperglycemia: Secondary | ICD-10-CM | POA: Diagnosis present

## 2020-12-23 LAB — CBC
HCT: 39 % (ref 36.0–46.0)
Hemoglobin: 12.6 g/dL (ref 12.0–15.0)
MCH: 30.6 pg (ref 26.0–34.0)
MCHC: 32.3 g/dL (ref 30.0–36.0)
MCV: 94.7 fL (ref 80.0–100.0)
Platelets: 147 10*3/uL — ABNORMAL LOW (ref 150–400)
RBC: 4.12 MIL/uL (ref 3.87–5.11)
RDW: 13.5 % (ref 11.5–15.5)
WBC: 6.1 10*3/uL (ref 4.0–10.5)
nRBC: 0 % (ref 0.0–0.2)

## 2020-12-23 LAB — COMPREHENSIVE METABOLIC PANEL
ALT: 15 U/L (ref 0–44)
AST: 19 U/L (ref 15–41)
Albumin: 3.9 g/dL (ref 3.5–5.0)
Alkaline Phosphatase: 93 U/L (ref 38–126)
Anion gap: 11 (ref 5–15)
BUN: 22 mg/dL (ref 8–23)
CO2: 26 mmol/L (ref 22–32)
Calcium: 9 mg/dL (ref 8.9–10.3)
Chloride: 102 mmol/L (ref 98–111)
Creatinine, Ser: 1.27 mg/dL — ABNORMAL HIGH (ref 0.44–1.00)
GFR, Estimated: 39 mL/min — ABNORMAL LOW (ref >60)
Glucose, Bld: 168 mg/dL — ABNORMAL HIGH (ref 70–99)
Potassium: 3.8 mmol/L (ref 3.5–5.1)
Sodium: 139 mmol/L (ref 135–145)
Total Bilirubin: 0.6 mg/dL (ref 0.3–1.2)
Total Protein: 6.4 g/dL — ABNORMAL LOW (ref 6.5–8.1)

## 2020-12-23 LAB — DIFFERENTIAL
Abs Immature Granulocytes: 0.01 10*3/uL (ref 0.00–0.07)
Basophils Absolute: 0.1 10*3/uL (ref 0.0–0.1)
Basophils Relative: 1 %
Eosinophils Absolute: 0 10*3/uL (ref 0.0–0.5)
Eosinophils Relative: 1 %
Immature Granulocytes: 0 %
Lymphocytes Relative: 14 %
Lymphs Abs: 0.9 10*3/uL (ref 0.7–4.0)
Monocytes Absolute: 0.4 10*3/uL (ref 0.1–1.0)
Monocytes Relative: 7 %
Neutro Abs: 4.7 10*3/uL (ref 1.7–7.7)
Neutrophils Relative %: 77 %

## 2020-12-23 LAB — ETHANOL: Alcohol, Ethyl (B): 13 mg/dL — ABNORMAL HIGH (ref <10)

## 2020-12-23 LAB — PROTIME-INR
INR: 1 (ref 0.8–1.2)
Prothrombin Time: 12.6 seconds (ref 11.4–15.2)

## 2020-12-23 LAB — RESP PANEL BY RT-PCR (FLU A&B, COVID) ARPGX2
Influenza A by PCR: NEGATIVE
Influenza B by PCR: NEGATIVE
SARS Coronavirus 2 by RT PCR: NEGATIVE

## 2020-12-23 LAB — APTT: aPTT: 30 seconds (ref 24–36)

## 2020-12-23 MED ORDER — IOHEXOL 350 MG/ML SOLN
60.0000 mL | Freq: Once | INTRAVENOUS | Status: AC | PRN
  Administered 2020-12-23: 60 mL via INTRAVENOUS

## 2020-12-23 NOTE — ED Triage Notes (Signed)
Patient here POV from Home with R. Hand Weakness (Patient noted she could not hold items without spilling items with R. Arm).   Symptoms began 2 days PTA per Patient. Family here at Triage  Slight Drift noted to R. Leg. A&Ox4. GCS 15.

## 2020-12-23 NOTE — ED Provider Notes (Signed)
[facility]ERG[facility] DEPT Provider Note   CSN: [id number] Arrival date & time: 12/23/20  1933     History Chief Complaint  Patient presents with   Hand Weakness    Right    [name] is a 87 y.o. female.  HPI 87 year old female presents with right-sided weakness.  Primarily is in her hand but also her leg.  Started 2 days ago.  She states that the daughter found out tonight which is why she presented to the ER.  There is no headache though the daughter notes she has been told she has an aneurysm in her brain.  No new shortness of breath though she chronically is dyspneic from COPD.  The right leg is weak enough that she is dragging it when walking with a walker.  The right hand is weak enough that she is dropping things like her fork tonight.  Past Medical History:  Diagnosis Date   Anxiety    Aortic arch atherosclerosis ([place]) 06/24/2014   Atherosclerotic ulcer of aorta ([place]) 06/24/2014   Carotid artery occlusion    Chronic kidney disease    COPD (chronic obstructive pulmonary disease) ([place])    [place] DISEASE, LUMBAR 12/16/2008   DIVERTICULOSIS, COLON 09/30/2008   DYSPNEA 07/13/2008   Graves disease    History of embolic stroke 4/81/8563   Left brain   HYPERLIPIDEMIA 03/06/2007   HYPERTENSION 03/06/2007   HYPOTHYROIDISM 10/13/2007   OSTEOARTHRITIS 03/06/2007   Personal history of colonic polyps 09/30/2008   Stroke ([place])    06/2014            TOBACCO USE, QUIT 04/12/2009   WEAKNESS 11/09/2007    Patient Active Problem List   Diagnosis Date Noted   Right hand weakness 14/97/0263   Acute systolic CHF (congestive heart failure) ([place]) 09/01/2020   Acute respiratory failure (HCC)    Mild aortic stenosis 08/30/2020   Elevated troponin 08/30/2020   Pleural effusion on left 08/30/2020   Pain in joint of left knee 04/10/2020   D-dimer, elevated 01/14/2020   Elevated brain natriuretic peptide (BNP) level 01/14/2020   Advice given about COVID-19 virus infection  07/21/2019   Current chronic use of systemic steroids 07/13/2019   Medication management 04/02/2018   COPD exacerbation ([place]) 03/31/2018   Hypokalemia 03/30/2018   COPD with acute exacerbation ([place]) 03/02/2018   SOB (shortness of breath) 01/12/2018   CKD (chronic kidney disease), stage III 01/12/2018   New onset left bundle branch block (LBBB) 01/12/2018   COPD GOLD I D 01/12/2018   Neurogenic claudication due to lumbar spinal stenosis 09/13/2017   Symptomatic anemia    AVM (arteriovenous malformation) of duodenum, acquired with hemorrhage    Acute GI bleeding 10/09/2016   COPD with emphysema ([place]) 05/10/2016   Recurrent laryngeal neuropathy 02/01/2016   Nausea with vomiting 12/11/2015   Left carotid stenosis 11/21/2015   Asymptomatic carotid artery stenosis 11/13/2015   Impaired glucose tolerance 10/27/2015   Solitary pulmonary nodule 10/27/2015   Thyroid nodule 10/27/2015   Syncope 10/04/2015   Bradycardia with less than 60 beats per minute 10/04/2015   History of embolic stroke 78/58/8502   Paresthesia of both feet 07/06/2014   Aortic arch atherosclerosis ([place]) 06/24/2014   Atherosclerotic ulcer of aorta ([place]) 06/24/2014   Stroke ([place]) 06/22/2014   Bilateral carotid artery disease ([place]) 05/18/2014   TOBACCO USE, QUIT 04/12/2009   [place] DISEASE, LUMBAR 12/16/2008   DIVERTICULOSIS, COLON 09/30/2008   Personal history of colonic polyps 09/30/2008   DYSPNEA 07/13/2008  Weakness 11/09/2007   Hypothyroidism 10/13/2007   Mixed hyperlipidemia 03/06/2007   Essential hypertension 03/06/2007   Osteoarthritis 03/06/2007    Past Surgical History:  Procedure Laterality Date   CARDIAC CATHETERIZATION     CATARACT EXTRACTION Bilateral    CHOLECYSTECTOMY     ENDARTERECTOMY Left 11/13/2015   Procedure: LEFT CAROTID ARTERY ENDARTERECTOMY;  Surgeon: Conrad Green Tree, MD;  Location: [facility];  Service: Vascular;  Laterality: Left;   ESOPHAGOGASTRODUODENOSCOPY (EGD) WITH PROPOFOL N/A 10/11/2016    Procedure: ESOPHAGOGASTRODUODENOSCOPY (EGD) WITH PROPOFOL;  Surgeon: Mauri Pole, MD;  Location: [facility];  Service: Endoscopy;  Laterality: N/A;   KNEE SURGERY     PATCH ANGIOPLASTY Left 11/13/2015   Procedure: WITH 1CM X 6CM  XENOSURE BIOLOGIC PATCH ANGIOPLASTY;  Surgeon: Conrad Chinchilla, MD;  Location: [facility];  Service: Vascular;  Laterality: Left;   TEE WITHOUT CARDIOVERSION N/A 06/24/2014   Procedure: TRANSESOPHAGEAL ECHOCARDIOGRAM (TEE);  Surgeon: Sanda Klein, MD;  Location: [facility] ENDOSCOPY;  Service: Cardiovascular;  Laterality: N/A;     OB History   No obstetric history on file.     Family History  Problem Relation Age of Onset   Stomach cancer Maternal Grandmother    Colon cancer Neg Hx     Social History   Tobacco Use   Smoking status: Former    Packs/day: 1.50    Years: 29.00    Pack years: 43.50    Types: Cigarettes    Start date: 07/08/1949    Quit date: 07/08/1978    Years since quitting: 42.4   Smokeless tobacco: Never  Vaping Use   Vaping Use: Never used  Substance Use Topics   Alcohol use: Yes    Alcohol/week: 1.0 standard drink    Types: 1 Glasses of wine per week    Comment: "red wine"- some nights   Drug use: No    Home Medications Prior to Admission medications   Medication Sig Start Date End Date Taking? Authorizing Provider  albuterol (PROAIR HFA) 108 (90 Base) MCG/ACT inhaler Inhale 2 puffs into the lungs every 6 (six) hours as needed for wheezing or shortness of breath. 11/13/17   Volanda Napoleon, PA-C  ALPRAZolam Duanne Moron) 0.5 MG tablet Take 1 tablet (0.5 mg total) by mouth 2 (two) times daily. 11/30/20   Nafziger, Tommi Rumps, NP  amLODipine (NORVASC) 5 MG tablet Take 1 tablet (5 mg total) by mouth daily. 12/07/20   Kathyrn Drown D, NP  atorvastatin (LIPITOR) 80 MG tablet TAKE 1 TABLET BY MOUTH EVERY DAY AT [facility] 12/07/20   Kathyrn Drown D, NP  doxepin (SINEQUAN) 25 MG capsule Take 25 mg by mouth at bedtime. 12/02/20   [provider]  furosemide  (LASIX) 20 MG tablet Take 1 tablet (20 mg total) by mouth daily. 12/07/20   Kathyrn Drown D, NP  hydrALAZINE (APRESOLINE) 50 MG tablet Take 1 tablet (50 mg total) by mouth 3 (three) times daily. 12/07/20   Kathyrn Drown D, NP  ipratropium-albuterol (DUONEB) 0.5-2.5 (3) MG/3ML SOLN Take 3 mLs by nebulization every 6 (six) hours as needed (copd). 01/16/20   Danford, Suann Larry, MD  isosorbide mononitrate (IMDUR) 30 MG 24 hr tablet Take 1 tablet (30 mg total) by mouth daily. 12/07/20   Tommie Raymond, NP  latanoprost (XALATAN) 0.005 % ophthalmic solution Place 1 drop into both eyes at bedtime.  04/02/19   [provider]  levothyroxine (SYNTHROID) 75 MCG tablet Take 1 tablet (75 mcg total) by mouth daily before breakfast.  07/04/20   Nafziger, Tommi Rumps, NP  pantoprazole (PROTONIX) 40 MG tablet TAKE 1 TABLET BY MOUTH EVERY DAY 11/28/20   Nafziger, Tommi Rumps, NP  potassium chloride SA (KLOR-CON) 20 MEQ tablet Take 1 tablet (20 mEq total) by mouth daily. 12/07/20   Tommie Raymond, NP  predniSONE (DELTASONE) 5 MG tablet Take 5 mg by mouth daily with breakfast.    [provider]  Spacer/Aero-Holding Chambers (AEROCHAMBER MV) inhaler Use as instructed 11/26/17   Magdalen Spatz, NP  TRELEGY ELLIPTA 100-62.5-25 MCG/INH AEPB INHALE 1 PUFF BY MOUTH EVERY DAY 08/09/20   Mannam, Hart Robinsons, MD    Allergies    Catapres [clonidine hcl], Clonidine, Lisinopril, Labetalol, and Labetalol hcl  Review of Systems   Review of Systems  Respiratory:  Positive for shortness of breath.   Neurological:  Positive for weakness. Negative for headaches.  All other systems reviewed and are negative.  Physical Exam Updated Vital Signs BP (!) 153/95   Pulse 62   Temp 98.3 F (36.8 C) (Oral)   Resp (!) 23   Ht 5\' 6"  (1.676 m)   Wt 58.8 kg   SpO2 92%   BMI 20.92 kg/m   Physical Exam Vitals and nursing note reviewed.  Constitutional:      Appearance: She is well-developed.  HENT:     Head: Normocephalic and  atraumatic.     Right Ear: External ear normal.     Left Ear: External ear normal.     Nose: Nose normal.  Eyes:     General:        Right eye: No discharge.        Left eye: No discharge.  Cardiovascular:     Rate and Rhythm: Normal rate and regular rhythm.     Heart sounds: Normal heart sounds.  Pulmonary:     Effort: Pulmonary effort is normal.     Breath sounds: Normal breath sounds.  Abdominal:     Palpations: Abdomen is soft.     Tenderness: There is no abdominal tenderness.  Skin:    General: Skin is warm and dry.  Neurological:     Mental Status: She is alert.     Comments: CN 3-12 grossly intact. 5/5 strength in both left extremities. right arm is mildly weak with some mild right arm drift.  Right leg has 4/5 strength. Grossly normal sensation.  Able to do finger-to-nose bilaterally but slower and more difficult with the right   Psychiatric:        Mood and Affect: Mood is not anxious.    ED Results / Procedures / Treatments   Labs (all labs ordered are listed, but only abnormal results are displayed) Labs Reviewed  ETHANOL - Abnormal; Notable for the following components:      Result Value   Alcohol, Ethyl (B) 13 (*)    All other components within normal limits  CBC - Abnormal; Notable for the following components:   Platelets 147 (*)    All other components within normal limits  COMPREHENSIVE METABOLIC PANEL - Abnormal; Notable for the following components:   Glucose, Bld 168 (*)    Creatinine, Ser 1.27 (*)    Total Protein 6.4 (*)    GFR, Estimated 39 (*)    All other components within normal limits  RESP PANEL BY RT-PCR (FLU A&B, COVID) ARPGX2  PROTIME-INR  APTT  DIFFERENTIAL  RAPID URINE DRUG SCREEN, HOSP PERFORMED  URINALYSIS, ROUTINE W REFLEX MICROSCOPIC    EKG None  Radiology  CT Angio Head W or Wo Contrast  Result Date: 12/23/2020 CLINICAL DATA:  Right hand weakness EXAM: CT ANGIOGRAPHY HEAD AND NECK TECHNIQUE: Multidetector CT imaging of the  head and neck was performed using the standard protocol during bolus administration of intravenous contrast. Multiplanar CT image reconstructions and MIPs were obtained to evaluate the vascular anatomy. Carotid stenosis measurements (when applicable) are obtained utilizing NASCET criteria, using the distal internal carotid diameter as the denominator. CONTRAST:  63mL OMNIPAQUE IOHEXOL 350 MG/ML SOLN COMPARISON:  11/21/2015 FINDINGS: CT HEAD FINDINGS Brain: There is no mass, hemorrhage or extra-axial collection. The size and configuration of the ventricles and extra-axial CSF spaces are normal. There is hypoattenuation of the periventricular white matter, most commonly indicating chronic ischemic microangiopathy. Skull: The visualized skull base, calvarium and extracranial soft tissues are normal. Sinuses/Orbits: No fluid levels or advanced mucosal thickening of the visualized paranasal sinuses. No mastoid or middle ear effusion. The orbits are normal. CTA NECK FINDINGS SKELETON: Multilevel degenerative disc disease and facet hypertrophy without bony spinal canal stenosis. OTHER NECK: Normal pharynx, larynx and major salivary glands. No cervical lymphadenopathy. Unremarkable thyroid gland. UPPER CHEST: Biapical emphysema. AORTIC ARCH: There a large amount of soft and calcific atherosclerosis of the aortic arch. There is no aneurysm, dissection or hemodynamically significant stenosis of the visualized portion of the aorta. Conventional 3 vessel aortic branching pattern. The visualized proximal subclavian arteries are widely patent. RIGHT CAROTID SYSTEM: No dissection, occlusion or aneurysm. There is calcific atherosclerosis extending into the proximal ICA, resulting in 40% stenosis. LEFT CAROTID SYSTEM: 1.6 cm dilatation of the carotid bulb is unchanged. No stenosis. Status post endarterectomy. VERTEBRAL ARTERIES: Right dominant configuration. Both origins are clearly patent. There is no dissection, occlusion or  flow-limiting stenosis to the skull base (V1-V3 segments). CTA HEAD FINDINGS POSTERIOR CIRCULATION: --Vertebral arteries: Atherosclerotic calcification on the right without stenosis. --Inferior cerebellar arteries: Normal. --Basilar artery: Normal. --Superior cerebellar arteries: Normal. --Posterior cerebral arteries (PCA): Normal. ANTERIOR CIRCULATION: --Intracranial internal carotid arteries: Aneurysm of the left ophthalmic/clinoid segment measures 1.7 x 1.4 cm, previously 1.0 x 1.0 cm. There is fusiform aneurysmal dilatation of the proximal cavernous segment measuring 7 mm. --Anterior cerebral arteries (ACA): Normal. Absent right A1 segment, normal variant --Middle cerebral arteries (MCA): Normal. VENOUS SINUSES: As permitted by contrast timing, patent. ANATOMIC VARIANTS: None Review of the MIP images confirms the above findings. IMPRESSION: 1. No emergent large vessel occlusion or high-grade stenosis of the intracranial arteries. 2. Increased size of left ICA ophthalmic/clinoid segment aneurysm, now measuring 1.7 x 1.4 cm, previously 1.0 x 1.0 cm. 3. Unchanged fusiform dilatation of the proximal cavernous segment measuring 7 mm. 4. Status post left carotid endarterectomy without stenosis. 40% stenosis of the proximal right internal carotid artery by NASCET criteria. Aortic Atherosclerosis (ICD10-I70.0) and Emphysema (ICD10-J43.9). Electronically Signed   By: Ulyses Jarred M.D.   On: 12/23/2020 21:35   CT Angio Neck W and/or Wo Contrast  Result Date: 12/23/2020 CLINICAL DATA:  Right hand weakness EXAM: CT ANGIOGRAPHY HEAD AND NECK TECHNIQUE: Multidetector CT imaging of the head and neck was performed using the standard protocol during bolus administration of intravenous contrast. Multiplanar CT image reconstructions and MIPs were obtained to evaluate the vascular anatomy. Carotid stenosis measurements (when applicable) are obtained utilizing NASCET criteria, using the distal internal carotid diameter as the  denominator. CONTRAST:  44mL OMNIPAQUE IOHEXOL 350 MG/ML SOLN COMPARISON:  11/21/2015 FINDINGS: CT HEAD FINDINGS Brain: There is no mass, hemorrhage or extra-axial collection. The size and configuration  of the ventricles and extra-axial CSF spaces are normal. There is hypoattenuation of the periventricular white matter, most commonly indicating chronic ischemic microangiopathy. Skull: The visualized skull base, calvarium and extracranial soft tissues are normal. Sinuses/Orbits: No fluid levels or advanced mucosal thickening of the visualized paranasal sinuses. No mastoid or middle ear effusion. The orbits are normal. CTA NECK FINDINGS SKELETON: Multilevel degenerative disc disease and facet hypertrophy without bony spinal canal stenosis. OTHER NECK: Normal pharynx, larynx and major salivary glands. No cervical lymphadenopathy. Unremarkable thyroid gland. UPPER CHEST: Biapical emphysema. AORTIC ARCH: There a large amount of soft and calcific atherosclerosis of the aortic arch. There is no aneurysm, dissection or hemodynamically significant stenosis of the visualized portion of the aorta. Conventional 3 vessel aortic branching pattern. The visualized proximal subclavian arteries are widely patent. RIGHT CAROTID SYSTEM: No dissection, occlusion or aneurysm. There is calcific atherosclerosis extending into the proximal ICA, resulting in 40% stenosis. LEFT CAROTID SYSTEM: 1.6 cm dilatation of the carotid bulb is unchanged. No stenosis. Status post endarterectomy. VERTEBRAL ARTERIES: Right dominant configuration. Both origins are clearly patent. There is no dissection, occlusion or flow-limiting stenosis to the skull base (V1-V3 segments). CTA HEAD FINDINGS POSTERIOR CIRCULATION: --Vertebral arteries: Atherosclerotic calcification on the right without stenosis. --Inferior cerebellar arteries: Normal. --Basilar artery: Normal. --Superior cerebellar arteries: Normal. --Posterior cerebral arteries (PCA): Normal. ANTERIOR  CIRCULATION: --Intracranial internal carotid arteries: Aneurysm of the left ophthalmic/clinoid segment measures 1.7 x 1.4 cm, previously 1.0 x 1.0 cm. There is fusiform aneurysmal dilatation of the proximal cavernous segment measuring 7 mm. --Anterior cerebral arteries (ACA): Normal. Absent right A1 segment, normal variant --Middle cerebral arteries (MCA): Normal. VENOUS SINUSES: As permitted by contrast timing, patent. ANATOMIC VARIANTS: None Review of the MIP images confirms the above findings. IMPRESSION: 1. No emergent large vessel occlusion or high-grade stenosis of the intracranial arteries. 2. Increased size of left ICA ophthalmic/clinoid segment aneurysm, now measuring 1.7 x 1.4 cm, previously 1.0 x 1.0 cm. 3. Unchanged fusiform dilatation of the proximal cavernous segment measuring 7 mm. 4. Status post left carotid endarterectomy without stenosis. 40% stenosis of the proximal right internal carotid artery by NASCET criteria. Aortic Atherosclerosis (ICD10-I70.0) and Emphysema (ICD10-J43.9). Electronically Signed   By: Ulyses Jarred M.D.   On: 12/23/2020 21:35    Procedures Procedures   Medications Ordered in ED Medications  iohexol (OMNIPAQUE) 350 MG/ML injection 60 mL (60 mLs Intravenous Contrast Given 12/23/20 2046)    ED Course  I have reviewed the triage vital signs and the nursing notes.  Pertinent labs & imaging results that were available during my care of the patient were reviewed by me and considered in my medical decision making (see chart for details).  Clinical Course as of 12/23/20 2351  Sat Dec 23, 2020  1940 Weakness onset was 2 days ago so she is not a candidate as a code stroke.  We will pursue stroke work-up however. [SG]    Clinical Course User Index [SG] Sherwood Gambler, MD   MDM Rules/Calculators/A&P                          Patient CTs have been reviewed and there is no obvious stroke or large vessel occlusion.  The CTA was obtained given her prior aneurysm  history and her ICA ophthalmic/clinoid segment aneurysm is slightly bigger compared to several years ago.  However I do not think this is causing her symptoms.  Consistent with a subacute stroke on exam and  because of difficulty walking and her age I think she will need to be admitted.  Discussed with Dr. Myna Hidalgo. Final Clinical Impression(s) / ED Diagnoses Final diagnoses:  Right sided weakness    Rx / DC Orders ED Discharge Orders     None        Sherwood Gambler, MD 12/23/20 2351

## 2020-12-23 NOTE — ED Notes (Signed)
RT placed pt on RA from [place] 2 L. Pt states she is breathing much better, AEB pts WOB was decreased to a normal range. RT will continue to monitor.

## 2020-12-23 NOTE — ED Notes (Signed)
EDP [place] present during Triage

## 2020-12-23 NOTE — ED Notes (Signed)
RT educated pt on importance of COPD maintenance meds and when she should be using her MDI/NEBs to assist w/better breathing, especially during the hot humid days when she states she has more trouble breathing. Daughter present for teaching and both understand importance and process. RT will continue to monitor.

## 2020-12-24 DIAGNOSIS — R339 Retention of urine, unspecified: Secondary | ICD-10-CM | POA: Diagnosis present

## 2020-12-24 DIAGNOSIS — R29898 Other symptoms and signs involving the musculoskeletal system: Secondary | ICD-10-CM | POA: Diagnosis not present

## 2020-12-24 DIAGNOSIS — E039 Hypothyroidism, unspecified: Secondary | ICD-10-CM | POA: Diagnosis not present

## 2020-12-24 DIAGNOSIS — R29703 NIHSS score 3: Secondary | ICD-10-CM | POA: Diagnosis not present

## 2020-12-24 DIAGNOSIS — N1832 Chronic kidney disease, stage 3b: Secondary | ICD-10-CM | POA: Diagnosis not present

## 2020-12-24 DIAGNOSIS — I639 Cerebral infarction, unspecified: Secondary | ICD-10-CM

## 2020-12-24 DIAGNOSIS — G8191 Hemiplegia, unspecified affecting right dominant side: Secondary | ICD-10-CM | POA: Diagnosis not present

## 2020-12-24 DIAGNOSIS — I671 Cerebral aneurysm, nonruptured: Secondary | ICD-10-CM | POA: Diagnosis not present

## 2020-12-24 DIAGNOSIS — Z888 Allergy status to other drugs, medicaments and biological substances status: Secondary | ICD-10-CM | POA: Diagnosis not present

## 2020-12-24 DIAGNOSIS — K5900 Constipation, unspecified: Secondary | ICD-10-CM | POA: Diagnosis not present

## 2020-12-24 DIAGNOSIS — E1122 Type 2 diabetes mellitus with diabetic chronic kidney disease: Secondary | ICD-10-CM | POA: Diagnosis not present

## 2020-12-24 DIAGNOSIS — R29810 Facial weakness: Secondary | ICD-10-CM | POA: Diagnosis not present

## 2020-12-24 DIAGNOSIS — R471 Dysarthria and anarthria: Secondary | ICD-10-CM | POA: Diagnosis not present

## 2020-12-24 DIAGNOSIS — Z66 Do not resuscitate: Secondary | ICD-10-CM | POA: Diagnosis not present

## 2020-12-24 DIAGNOSIS — Z7952 Long term (current) use of systemic steroids: Secondary | ICD-10-CM | POA: Diagnosis not present

## 2020-12-24 DIAGNOSIS — F419 Anxiety disorder, unspecified: Secondary | ICD-10-CM | POA: Diagnosis not present

## 2020-12-24 DIAGNOSIS — K219 Gastro-esophageal reflux disease without esophagitis: Secondary | ICD-10-CM | POA: Diagnosis not present

## 2020-12-24 DIAGNOSIS — I480 Paroxysmal atrial fibrillation: Secondary | ICD-10-CM | POA: Diagnosis not present

## 2020-12-24 DIAGNOSIS — E1165 Type 2 diabetes mellitus with hyperglycemia: Secondary | ICD-10-CM | POA: Diagnosis not present

## 2020-12-24 DIAGNOSIS — Z20822 Contact with and (suspected) exposure to covid-19: Secondary | ICD-10-CM | POA: Diagnosis not present

## 2020-12-24 DIAGNOSIS — I130 Hypertensive heart and chronic kidney disease with heart failure and stage 1 through stage 4 chronic kidney disease, or unspecified chronic kidney disease: Secondary | ICD-10-CM | POA: Diagnosis not present

## 2020-12-24 DIAGNOSIS — Z8673 Personal history of transient ischemic attack (TIA), and cerebral infarction without residual deficits: Secondary | ICD-10-CM | POA: Diagnosis not present

## 2020-12-24 DIAGNOSIS — E785 Hyperlipidemia, unspecified: Secondary | ICD-10-CM | POA: Diagnosis not present

## 2020-12-24 DIAGNOSIS — J449 Chronic obstructive pulmonary disease, unspecified: Secondary | ICD-10-CM | POA: Diagnosis not present

## 2020-12-24 DIAGNOSIS — I5042 Chronic combined systolic (congestive) and diastolic (congestive) heart failure: Secondary | ICD-10-CM | POA: Diagnosis not present

## 2020-12-24 DIAGNOSIS — I6330 Cerebral infarction due to thrombosis of unspecified cerebral artery: Secondary | ICD-10-CM | POA: Diagnosis not present

## 2020-12-24 DIAGNOSIS — Z87891 Personal history of nicotine dependence: Secondary | ICD-10-CM | POA: Diagnosis not present

## 2020-12-24 LAB — RAPID URINE DRUG SCREEN, HOSP PERFORMED
Amphetamines: NOT DETECTED
Barbiturates: NOT DETECTED
Benzodiazepines: POSITIVE — AB
Cocaine: NOT DETECTED
Opiates: NOT DETECTED
Tetrahydrocannabinol: NOT DETECTED

## 2020-12-24 LAB — URINALYSIS, ROUTINE W REFLEX MICROSCOPIC
Bilirubin Urine: NEGATIVE
Glucose, UA: NEGATIVE mg/dL
Hgb urine dipstick: NEGATIVE
Ketones, ur: NEGATIVE mg/dL
Leukocytes,Ua: NEGATIVE
Nitrite: NEGATIVE
Protein, ur: NEGATIVE mg/dL
Specific Gravity, Urine: 1.011 (ref 1.005–1.030)
pH: 7.5 (ref 5.0–8.0)

## 2020-12-24 MED ORDER — AMLODIPINE BESYLATE 5 MG PO TABS
5.0000 mg | ORAL_TABLET | Freq: Every day | ORAL | Status: DC
  Administered 2020-12-24 – 2020-12-27 (×4): 5 mg via ORAL
  Filled 2020-12-24 (×4): qty 1

## 2020-12-24 MED ORDER — INSULIN ASPART 100 UNIT/ML IJ SOLN
0.0000 [IU] | Freq: Every day | INTRAMUSCULAR | Status: DC
Start: 2020-12-25 — End: 2020-12-26

## 2020-12-24 MED ORDER — ISOSORBIDE MONONITRATE ER 30 MG PO TB24
30.0000 mg | ORAL_TABLET | Freq: Every day | ORAL | Status: DC
  Administered 2020-12-25 – 2020-12-27 (×3): 30 mg via ORAL
  Filled 2020-12-24 (×4): qty 1

## 2020-12-24 MED ORDER — ACETAMINOPHEN 325 MG PO TABS: 650.0000 mg | ORAL_TABLET | Freq: Four times a day (QID) | ORAL | Status: DC | PRN

## 2020-12-24 MED ORDER — FUROSEMIDE 20 MG PO TABS
20.0000 mg | ORAL_TABLET | Freq: Every day | ORAL | Status: DC
  Administered 2020-12-24 – 2020-12-27 (×4): 20 mg via ORAL
  Filled 2020-12-24 (×4): qty 1

## 2020-12-24 MED ORDER — PANTOPRAZOLE SODIUM 40 MG PO TBEC
40.0000 mg | DELAYED_RELEASE_TABLET | Freq: Every day | ORAL | Status: DC
  Administered 2020-12-24 – 2020-12-27 (×4): 40 mg via ORAL
  Filled 2020-12-24 (×4): qty 1

## 2020-12-24 MED ORDER — ALBUTEROL SULFATE (2.5 MG/3ML) 0.083% IN NEBU: 2.5000 mg | INHALATION_SOLUTION | Freq: Four times a day (QID) | RESPIRATORY_TRACT | Status: DC | PRN

## 2020-12-24 MED ORDER — IPRATROPIUM-ALBUTEROL 0.5-2.5 (3) MG/3ML IN SOLN: 3.0000 mL | Freq: Four times a day (QID) | RESPIRATORY_TRACT | Status: DC | PRN

## 2020-12-24 MED ORDER — PREDNISONE 5 MG PO TABS
5.0000 mg | ORAL_TABLET | Freq: Every day | ORAL | Status: DC
  Administered 2020-12-24 – 2020-12-27 (×4): 5 mg via ORAL
  Filled 2020-12-24 (×4): qty 1

## 2020-12-24 MED ORDER — CHLORHEXIDINE GLUCONATE CLOTH 2 % EX PADS
6.0000 | MEDICATED_PAD | Freq: Every day | CUTANEOUS | Status: DC
  Administered 2020-12-24 – 2020-12-27 (×4): 6 via TOPICAL

## 2020-12-24 MED ORDER — ATORVASTATIN CALCIUM 80 MG PO TABS
80.0000 mg | ORAL_TABLET | Freq: Every day | ORAL | Status: DC
  Administered 2020-12-25 – 2020-12-27 (×3): 80 mg via ORAL
  Filled 2020-12-24 (×3): qty 1

## 2020-12-24 MED ORDER — INSULIN ASPART 100 UNIT/ML IJ SOLN
0.0000 [IU] | Freq: Three times a day (TID) | INTRAMUSCULAR | Status: DC
Start: 2020-12-25 — End: 2020-12-26

## 2020-12-24 MED ORDER — LEVOTHYROXINE SODIUM 75 MCG PO TABS
75.0000 ug | ORAL_TABLET | Freq: Every day | ORAL | Status: DC
  Administered 2020-12-24 – 2020-12-27 (×4): 75 ug via ORAL
  Filled 2020-12-24: qty 3
  Filled 2020-12-24 (×3): qty 1

## 2020-12-24 MED ORDER — ALPRAZOLAM 0.5 MG PO TABS
0.5000 mg | ORAL_TABLET | Freq: Three times a day (TID) | ORAL | Status: DC | PRN
  Administered 2020-12-24 – 2020-12-25 (×2): 0.5 mg via ORAL
  Filled 2020-12-24: qty 1

## 2020-12-24 MED ORDER — ASPIRIN 325 MG PO TABS: 325.0000 mg | ORAL_TABLET | Freq: Every day | ORAL | Status: DC

## 2020-12-24 MED ORDER — ONDANSETRON HCL 4 MG/2ML IJ SOLN: 4.0000 mg | Freq: Four times a day (QID) | INTRAMUSCULAR | Status: DC | PRN

## 2020-12-24 MED ORDER — LATANOPROST 0.005 % OP SOLN
1.0000 [drp] | Freq: Every day | OPHTHALMIC | Status: DC
  Administered 2020-12-24 – 2020-12-26 (×3): 1 [drp] via OPHTHALMIC
  Filled 2020-12-24: qty 2.5

## 2020-12-24 MED ORDER — MELATONIN 3 MG PO TABS: 3.0000 mg | ORAL_TABLET | Freq: Every evening | ORAL | Status: DC | PRN

## 2020-12-24 MED ORDER — SENNOSIDES-DOCUSATE SODIUM 8.6-50 MG PO TABS
2.0000 | ORAL_TABLET | Freq: Every day | ORAL | Status: DC
  Administered 2020-12-24 – 2020-12-26 (×3): 2 via ORAL
  Filled 2020-12-24 (×3): qty 2

## 2020-12-24 MED ORDER — ALBUTEROL SULFATE HFA 108 (90 BASE) MCG/ACT IN AERS: 2.0000 | INHALATION_SPRAY | Freq: Four times a day (QID) | RESPIRATORY_TRACT | Status: DC | PRN

## 2020-12-24 MED ORDER — ALPRAZOLAM 0.5 MG PO TABS
0.5000 mg | ORAL_TABLET | Freq: Two times a day (BID) | ORAL | Status: DC | PRN
  Administered 2020-12-24: 0.5 mg via ORAL
  Filled 2020-12-24 (×2): qty 1

## 2020-12-24 MED ORDER — POTASSIUM CHLORIDE CRYS ER 20 MEQ PO TBCR
20.0000 meq | EXTENDED_RELEASE_TABLET | Freq: Every day | ORAL | Status: DC
  Administered 2020-12-24 – 2020-12-27 (×4): 20 meq via ORAL
  Filled 2020-12-24 (×4): qty 1

## 2020-12-24 MED ORDER — ALPRAZOLAM 0.5 MG PO TABS
0.5000 mg | ORAL_TABLET | Freq: Two times a day (BID) | ORAL | Status: DC
  Administered 2020-12-24 – 2020-12-27 (×5): 0.5 mg via ORAL
  Filled 2020-12-24 (×6): qty 1

## 2020-12-24 MED ORDER — ENOXAPARIN SODIUM 30 MG/0.3ML IJ SOSY
30.0000 mg | PREFILLED_SYRINGE | Freq: Every day | INTRAMUSCULAR | Status: DC
  Administered 2020-12-25 – 2020-12-27 (×3): 30 mg via SUBCUTANEOUS
  Filled 2020-12-24 (×4): qty 0.3

## 2020-12-24 MED ORDER — HYDRALAZINE HCL 50 MG PO TABS
50.0000 mg | ORAL_TABLET | Freq: Three times a day (TID) | ORAL | Status: DC
  Administered 2020-12-24 – 2020-12-27 (×9): 50 mg via ORAL
  Filled 2020-12-24 (×4): qty 1
  Filled 2020-12-24 (×2): qty 2
  Filled 2020-12-24 (×5): qty 1

## 2020-12-24 MED ORDER — ASPIRIN 325 MG PO TABS
325.0000 mg | ORAL_TABLET | Freq: Every day | ORAL | Status: DC
  Administered 2020-12-25 – 2020-12-26 (×3): 325 mg via ORAL
  Filled 2020-12-24 (×2): qty 1

## 2020-12-24 NOTE — H&P (Addendum)
History and Physical  [name] BOF:[id number] DOB: September 27, 1924 DOA: 12/23/2020  Referring physician: Accepted by Dr. Myna Hidalgo, [facility] as a direct transfer from [place] ED to [place]  2West unit.[name] PCP: [name], NP  Outpatient Specialists: Neurology, cardiology. Patient coming from: Home.   Chief Complaint: Right sided weakness  HPI: [name] is a 87 y.o. female with medical history significant for prior CVA in 2015, former tobacco user, quit 4 years ago, COPD, CKD 3B, chronic anxiety, hypertension, hyperlipidemia, hypothyroidism, GERD, who presented to [place] ED on 12/23/2020 due to right-sided weakness of 2 days duration.  Associated with dropping things out of her right hand and dragging her right lower extremity.  She uses a walker at baseline.  Last known well was on 12/21/2020.  She was outside of window for tPA.  She was accepted by Dr. Myna Hidalgo, [facility] as a direct transfer to [facility] for MRI brain.  MRI brain is currently pending.  She had a CT head that revealed no acute changes.  She also had a CTA head and neck which revealed increased size of left ICA ophthalmic/clinoid segment aneurysm from 1 x 1 cm to 1.7 x 1.4 cm.  Unchanged fusiform dilatation of the proximal cavernous segment measuring 7 mm.  Status post left carotid endarterectomy without stenosis, 40% stenosis of the proximal right internal carotid artery by NASCET criteria.  Patient was admitted for stroke work-up.   ED Course:  Temperature 98.2.  BP 144/60, pulse 67, respiratory 20, O2 saturation 100% on room air.  Lab studies remarkable for serum glucose 168, BUN 22, creatinine 1.27, anion gap 11, GFR 39.  CBC essentially unremarkable except for platelet count of 147.  UA negative for pyuria.  Review of Systems: Review of systems as noted in the HPI. All other systems reviewed and are negative.   Past Medical History:  Diagnosis Date   Anxiety    Aortic arch atherosclerosis ([place]) 06/24/2014    Atherosclerotic ulcer of aorta ([place]) 06/24/2014   Carotid artery occlusion    Chronic kidney disease    COPD (chronic obstructive pulmonary disease) ([place])    [place] DISEASE, LUMBAR 12/16/2008   DIVERTICULOSIS, COLON 09/30/2008   DYSPNEA 07/13/2008   [place] disease    History of embolic stroke 7/78/2423   Left brain   HYPERLIPIDEMIA 03/06/2007   HYPERTENSION 03/06/2007   HYPOTHYROIDISM 10/13/2007   OSTEOARTHRITIS 03/06/2007   Personal history of colonic polyps 09/30/2008   Stroke ([place])    06/2014            TOBACCO USE, QUIT 04/12/2009   WEAKNESS 11/09/2007   Past Surgical History:  Procedure Laterality Date   CARDIAC CATHETERIZATION     CATARACT EXTRACTION Bilateral    CHOLECYSTECTOMY     ENDARTERECTOMY Left 11/13/2015   Procedure: LEFT CAROTID ARTERY ENDARTERECTOMY;  Surgeon: Conrad Bruce, MD;  Location: [facility] OR;  Service: Vascular;  Laterality: Left;   ESOPHAGOGASTRODUODENOSCOPY (EGD) WITH PROPOFOL N/A 10/11/2016   Procedure: ESOPHAGOGASTRODUODENOSCOPY (EGD) WITH PROPOFOL;  Surgeon: Mauri Pole, MD;  Location: [facility] ENDOSCOPY;  Service: Endoscopy;  Laterality: N/A;   KNEE SURGERY     PATCH ANGIOPLASTY Left 11/13/2015   Procedure: WITH 1CM X 6CM  XENOSURE BIOLOGIC PATCH ANGIOPLASTY;  Surgeon: Conrad Edgar, MD;  Location: [place];  Service: Vascular;  Laterality: Left;   TEE WITHOUT CARDIOVERSION N/A 06/24/2014   Procedure: TRANSESOPHAGEAL ECHOCARDIOGRAM (TEE);  Surgeon: Sanda Klein, MD;  Location: [place];  Service: Cardiovascular;  Laterality: N/A;  Social History:  reports that she quit smoking about 42 years ago. Her smoking use included cigarettes. She started smoking about 71 years ago. She has a 43.50 pack-year smoking history. She has never used smokeless tobacco. She reports current alcohol use of about 1.0 standard drink of alcohol per week. She reports that she does not use drugs.   Allergies  Allergen Reactions   Catapres [Clonidine Hcl] Other (See Comments)    Made pt feel  horrible, shaky, weak and nausea   Clonidine Other (See Comments)    Made pt feel horrible, shaky, weak and nausea   Lisinopril Anaphylaxis    Tongue swelling   Labetalol Other (See Comments)    Caused bradycardia and syncope   Labetalol Hcl Other (See Comments)    Caused bradycardia and syncope    Family History  Problem Relation Age of Onset   Stomach cancer Maternal Grandmother    Colon cancer Neg Hx       Prior to Admission medications   Medication Sig Start Date End Date Taking? Authorizing Provider  albuterol (PROAIR HFA) 108 (90 Base) MCG/ACT inhaler Inhale 2 puffs into the lungs every 6 (six) hours as needed for wheezing or shortness of breath. 11/13/17  Yes Volanda Napoleon, PA-C  ALPRAZolam (XANAX) 0.5 MG tablet Take 1 tablet (0.5 mg total) by mouth 2 (two) times daily. 11/30/20  Yes Nafziger, Tommi Rumps, NP  amLODipine (NORVASC) 5 MG tablet Take 1 tablet (5 mg total) by mouth daily. 12/07/20  Yes Kathyrn Drown D, NP  atorvastatin (LIPITOR) 80 MG tablet TAKE 1 TABLET BY MOUTH EVERY DAY AT 6PM 12/07/20  Yes Kathyrn Drown D, NP  doxepin (SINEQUAN) 25 MG capsule Take 25 mg by mouth at bedtime. 12/02/20  Yes [provider]  furosemide (LASIX) 20 MG tablet Take 1 tablet (20 mg total) by mouth daily. 12/07/20  Yes Kathyrn Drown D, NP  hydrALAZINE (APRESOLINE) 50 MG tablet Take 1 tablet (50 mg total) by mouth 3 (three) times daily. 12/07/20  Yes Kathyrn Drown D, NP  ipratropium-albuterol (DUONEB) 0.5-2.5 (3) MG/3ML SOLN Take 3 mLs by nebulization every 6 (six) hours as needed (copd). 01/16/20  Yes Danford, Suann Larry, MD  isosorbide mononitrate (IMDUR) 30 MG 24 hr tablet Take 1 tablet (30 mg total) by mouth daily. 12/07/20  Yes Kathyrn Drown D, NP  latanoprost (XALATAN) 0.005 % ophthalmic solution Place 1 drop into both eyes at bedtime.  04/02/19  Yes [provider]  levothyroxine (SYNTHROID) 75 MCG tablet Take 1 tablet (75 mcg total) by mouth daily before breakfast. 07/04/20   Yes Nafziger, Tommi Rumps, NP  pantoprazole (PROTONIX) 40 MG tablet TAKE 1 TABLET BY MOUTH EVERY DAY 11/28/20  Yes Nafziger, Tommi Rumps, NP  potassium chloride SA (KLOR-CON) 20 MEQ tablet Take 1 tablet (20 mEq total) by mouth daily. 12/07/20  Yes Kathyrn Drown D, NP  predniSONE (DELTASONE) 5 MG tablet Take 5 mg by mouth daily with breakfast.   Yes [provider]  TRELEGY ELLIPTA 100-62.5-25 MCG/INH AEPB INHALE 1 PUFF BY MOUTH EVERY DAY 08/09/20  Yes Mannam, Hart Robinsons, MD  Spacer/Aero-Holding Chambers (AEROCHAMBER MV) inhaler Use as instructed 11/26/17   Magdalen Spatz, NP    Physical Exam: BP (!) 156/64 (BP Location: Right Arm)   Pulse 63   Temp (!) 97.5 F (36.4 C)   Resp 20   Ht 5\' 6"  (1.676 m)   Wt 58.8 kg   SpO2 95%   BMI 20.92 kg/m   General: 87 y.o.  year-old female well developed well nourished in no acute distress.  Alert and oriented x3. Cardiovascular: Regular rate and rhythm with no rubs or gallops.  No thyromegaly or JVD noted.  No lower extremity edema. 2/4 pulses in all 4 extremities. Respiratory: Clear to auscultation with no wheezes or rales. Good inspiratory effort. Abdomen: Soft nontender nondistended with normal bowel sounds x4 quadrants. Muskuloskeletal: No cyanosis, clubbing or edema noted bilaterally Neuro: CN II-XII intact, strength, sensation, reflexes Skin: No ulcerative lesions noted or rashes Psychiatry: Judgement and insight appear normal. Mood is appropriate for condition and setting          Labs on Admission:  Basic Metabolic Panel: Recent Labs  Lab 12/23/20 1942  NA 139  K 3.8  CL 102  CO2 26  GLUCOSE 168*  BUN 22  CREATININE 1.27*  CALCIUM 9.0   Liver Function Tests: Recent Labs  Lab 12/23/20 1942  AST 19  ALT 15  ALKPHOS 93  BILITOT 0.6  PROT 6.4*  ALBUMIN 3.9   No results for input(s): LIPASE, AMYLASE in the last 168 hours. No results for input(s): AMMONIA in the last 168 hours. CBC: Recent Labs  Lab 12/23/20 1942  WBC 6.1   NEUTROABS 4.7  HGB 12.6  HCT 39.0  MCV 94.7  PLT 147*   Cardiac Enzymes: No results for input(s): CKTOTAL, CKMB, CKMBINDEX, TROPONINI in the last 168 hours.  BNP (last 3 results) Recent Labs    11/17/20 0909 11/21/20 1911 11/29/20 1224  BNP 392.3* 435.1* 446.3*    ProBNP (last 3 results) No results for input(s): PROBNP in the last 8760 hours.  CBG: No results for input(s): GLUCAP in the last 168 hours.  Radiological Exams on Admission: CT Angio Head W or Wo Contrast  Result Date: 12/23/2020 CLINICAL DATA:  Right hand weakness EXAM: CT ANGIOGRAPHY HEAD AND NECK TECHNIQUE: Multidetector CT imaging of the head and neck was performed using the standard protocol during bolus administration of intravenous contrast. Multiplanar CT image reconstructions and MIPs were obtained to evaluate the vascular anatomy. Carotid stenosis measurements (when applicable) are obtained utilizing NASCET criteria, using the distal internal carotid diameter as the denominator. CONTRAST:  54mL OMNIPAQUE IOHEXOL 350 MG/ML SOLN COMPARISON:  11/21/2015 FINDINGS: CT HEAD FINDINGS Brain: There is no mass, hemorrhage or extra-axial collection. The size and configuration of the ventricles and extra-axial CSF spaces are normal. There is hypoattenuation of the periventricular white matter, most commonly indicating chronic ischemic microangiopathy. Skull: The visualized skull base, calvarium and extracranial soft tissues are normal. Sinuses/Orbits: No fluid levels or advanced mucosal thickening of the visualized paranasal sinuses. No mastoid or middle ear effusion. The orbits are normal. CTA NECK FINDINGS SKELETON: Multilevel degenerative disc disease and facet hypertrophy without bony spinal canal stenosis. OTHER NECK: Normal pharynx, larynx and major salivary glands. No cervical lymphadenopathy. Unremarkable thyroid gland. UPPER CHEST: Biapical emphysema. AORTIC ARCH: There a large amount of soft and calcific  atherosclerosis of the aortic arch. There is no aneurysm, dissection or hemodynamically significant stenosis of the visualized portion of the aorta. Conventional 3 vessel aortic branching pattern. The visualized proximal subclavian arteries are widely patent. RIGHT CAROTID SYSTEM: No dissection, occlusion or aneurysm. There is calcific atherosclerosis extending into the proximal ICA, resulting in 40% stenosis. LEFT CAROTID SYSTEM: 1.6 cm dilatation of the carotid bulb is unchanged. No stenosis. Status post endarterectomy. VERTEBRAL ARTERIES: Right dominant configuration. Both origins are clearly patent. There is no dissection, occlusion or flow-limiting stenosis to the skull base (V1-V3 segments).  CTA HEAD FINDINGS POSTERIOR CIRCULATION: --Vertebral arteries: Atherosclerotic calcification on the right without stenosis. --Inferior cerebellar arteries: Normal. --Basilar artery: Normal. --Superior cerebellar arteries: Normal. --Posterior cerebral arteries (PCA): Normal. ANTERIOR CIRCULATION: --Intracranial internal carotid arteries: Aneurysm of the left ophthalmic/clinoid segment measures 1.7 x 1.4 cm, previously 1.0 x 1.0 cm. There is fusiform aneurysmal dilatation of the proximal cavernous segment measuring 7 mm. --Anterior cerebral arteries (ACA): Normal. Absent right A1 segment, normal variant --Middle cerebral arteries (MCA): Normal. VENOUS SINUSES: As permitted by contrast timing, patent. ANATOMIC VARIANTS: None Review of the MIP images confirms the above findings. IMPRESSION: 1. No emergent large vessel occlusion or high-grade stenosis of the intracranial arteries. 2. Increased size of left ICA ophthalmic/clinoid segment aneurysm, now measuring 1.7 x 1.4 cm, previously 1.0 x 1.0 cm. 3. Unchanged fusiform dilatation of the proximal cavernous segment measuring 7 mm. 4. Status post left carotid endarterectomy without stenosis. 40% stenosis of the proximal right internal carotid artery by NASCET criteria. Aortic  Atherosclerosis (ICD10-I70.0) and Emphysema (ICD10-J43.9). Electronically Signed   By: Ulyses Jarred M.D.   On: 12/23/2020 21:35   CT Angio Neck W and/or Wo Contrast  Result Date: 12/23/2020 CLINICAL DATA:  Right hand weakness EXAM: CT ANGIOGRAPHY HEAD AND NECK TECHNIQUE: Multidetector CT imaging of the head and neck was performed using the standard protocol during bolus administration of intravenous contrast. Multiplanar CT image reconstructions and MIPs were obtained to evaluate the vascular anatomy. Carotid stenosis measurements (when applicable) are obtained utilizing NASCET criteria, using the distal internal carotid diameter as the denominator. CONTRAST:  98mL OMNIPAQUE IOHEXOL 350 MG/ML SOLN COMPARISON:  11/21/2015 FINDINGS: CT HEAD FINDINGS Brain: There is no mass, hemorrhage or extra-axial collection. The size and configuration of the ventricles and extra-axial CSF spaces are normal. There is hypoattenuation of the periventricular white matter, most commonly indicating chronic ischemic microangiopathy. Skull: The visualized skull base, calvarium and extracranial soft tissues are normal. Sinuses/Orbits: No fluid levels or advanced mucosal thickening of the visualized paranasal sinuses. No mastoid or middle ear effusion. The orbits are normal. CTA NECK FINDINGS SKELETON: Multilevel degenerative disc disease and facet hypertrophy without bony spinal canal stenosis. OTHER NECK: Normal pharynx, larynx and major salivary glands. No cervical lymphadenopathy. Unremarkable thyroid gland. UPPER CHEST: Biapical emphysema. AORTIC ARCH: There a large amount of soft and calcific atherosclerosis of the aortic arch. There is no aneurysm, dissection or hemodynamically significant stenosis of the visualized portion of the aorta. Conventional 3 vessel aortic branching pattern. The visualized proximal subclavian arteries are widely patent. RIGHT CAROTID SYSTEM: No dissection, occlusion or aneurysm. There is calcific  atherosclerosis extending into the proximal ICA, resulting in 40% stenosis. LEFT CAROTID SYSTEM: 1.6 cm dilatation of the carotid bulb is unchanged. No stenosis. Status post endarterectomy. VERTEBRAL ARTERIES: Right dominant configuration. Both origins are clearly patent. There is no dissection, occlusion or flow-limiting stenosis to the skull base (V1-V3 segments). CTA HEAD FINDINGS POSTERIOR CIRCULATION: --Vertebral arteries: Atherosclerotic calcification on the right without stenosis. --Inferior cerebellar arteries: Normal. --Basilar artery: Normal. --Superior cerebellar arteries: Normal. --Posterior cerebral arteries (PCA): Normal. ANTERIOR CIRCULATION: --Intracranial internal carotid arteries: Aneurysm of the left ophthalmic/clinoid segment measures 1.7 x 1.4 cm, previously 1.0 x 1.0 cm. There is fusiform aneurysmal dilatation of the proximal cavernous segment measuring 7 mm. --Anterior cerebral arteries (ACA): Normal. Absent right A1 segment, normal variant --Middle cerebral arteries (MCA): Normal. VENOUS SINUSES: As permitted by contrast timing, patent. ANATOMIC VARIANTS: None Review of the MIP images confirms the above findings. IMPRESSION: 1. No emergent  large vessel occlusion or high-grade stenosis of the intracranial arteries. 2. Increased size of left ICA ophthalmic/clinoid segment aneurysm, now measuring 1.7 x 1.4 cm, previously 1.0 x 1.0 cm. 3. Unchanged fusiform dilatation of the proximal cavernous segment measuring 7 mm. 4. Status post left carotid endarterectomy without stenosis. 40% stenosis of the proximal right internal carotid artery by NASCET criteria. Aortic Atherosclerosis (ICD10-I70.0) and Emphysema (ICD10-J43.9). Electronically Signed   By: Ulyses Jarred M.D.   On: 12/23/2020 21:35    EKG: I independently viewed the EKG done and my findings are as followed: None available at the time of this visit.  Assessment/Plan Present on Admission:  Right hand weakness  Active Problems:    Right hand weakness  Right hand/right-sided weakness Presented with complaints of right hand weakness, dropping things associated with mild right lower extremity weakness.  Last known well 12/21/2020. Outside of window for tPA Transferred to [facility] for MRI brain and for stroke work-up Obtain MRI brain without contrast 2D echo Lipid/A1c PT/OT/speech therapist assessment Frequent neurochecks Aspirin, high intensity statin N.p.o. until cleared by speech therapist No need for permissive hypertension per neurology Appreciate Dr. Johny Chess, neurologist assistance.  Left ICA ophthalmic/clinoid segment aneurysm from 1 x 1 cm to 1.7 x 1.4 cm. Follow MRI  Essential hypertension BP stable Continue home oral antihypertensive Continue to monitor vital signs  Type 2 diabetes with hyperglycemia, likely exacerbated by chronic steroid use.   Obtain hemoglobin A1c Goal hemoglobin A1c less than 7.0. Insulin sliding scale  Hyperlipidemia Obtain fasting lipid panel Goal LDL less than 70.  Combined chronic diastolic and systolic CHF Last 2D echo from 08/31/2020 revealed LVEF 40 to 45% and grade 1 diastolic dysfunction. Euvolemic on exam Resume home regimen Strict I's and O's and daily weight  Hypothyroidism Resume home levothyroxine  COPD on chronic steroid use Currently stable Resume home regimen  Chronic anxiety Resume home regimen  GERD/constipation PPI/bowel regimen  Ambulatory dysfunction Uses a walker to ambulate Follow PT OT assessment and recommendations   DVT prophylaxis: Subcu Lovenox daily.  Code Status: DNR per her daughter at bedside.  Family Communication: Daughter at bedside  Disposition Plan: Admitted to telemetry medical as a direct transfer from [place] ED by Dr. Myna Hidalgo, [facility]  Consults called: Neurology  Admission status: Observation status   Status is: Observation    Dispo: The patient is from: Home.                Anticipated d/c is  to: Home.               Patient currently not stable for discharge    Difficult to place patient, not applicable.      Kayleen Memos MD [facility] Pager [phone]  If 7PM-7AM, please contact night-coverage [email or web address] Password [email or web address]  12/24/2020, 8:16 PM

## 2020-12-24 NOTE — Plan of Care (Signed)

## 2020-12-24 NOTE — ED Notes (Signed)
Daughter [name] would like an update via her mobile. [place]

## 2020-12-24 NOTE — ED Notes (Signed)
0438-PT awoke from sleep complaining of severe lower abd pain. Upon examination Pt has distended lower abd and states she feels like she can't urinate and her lower back is hurting.   0442-Pt is bladder scanned by Rn and shows three separate results all over 867mls or urine in bladder. Dr. Florina Ou notified at this time and gives verbal order to insert foley catheter.

## 2020-12-24 NOTE — ED Notes (Signed)
Pt assisted up to chair at 1400.

## 2020-12-24 NOTE — Consult Note (Signed)
Neurology Consultation  Reason for Consult: Right-sided weakness Referring Physician: Dr. Nevada Crane  CC: Right-sided weakness  History is obtained from: Patient  HPI: [name] is a 87 y.o. female chronic kidney disease, COPD, hypertension, hyperlipidemia, stroke in 2015, history of tobacco abuse in the past, degenerative disc disease, atherosclerotic aortic arch, anxiety, presented to the emergency room for evaluation of right-sided weakness that started 2 days prior to presentation which puts her last known well sometime on 12/21/2020. She says that her symptoms started a couple of days ago with right leg weakness with slight dragging while walking with a walker but it was more of the right arm weakness that was bothering her.  The family noted that a day or so after symptoms started and brought her to the emergency room at the driver yesterday.  She was admitted for further evaluation of these issues. Patient was comfortably sitting in bed when I went to examine her.  She was able to give me some history but not a full detailed history at this time. My detailed examination as below No family was at bedside at this time.   LKW: Greater than 72 hours ago tpa given?: no, outside the window Premorbid modified Rankin scale (mRS): 3-4  ROS: Full ROS was performed and is negative except as noted in the HPI.  Past Medical History:  Diagnosis Date   Anxiety    Aortic arch atherosclerosis ([place]) 06/24/2014   Atherosclerotic ulcer of aorta ([place]) 06/24/2014   Carotid artery occlusion    Chronic kidney disease    COPD (chronic obstructive pulmonary disease) ([place])    [place] DISEASE, LUMBAR 12/16/2008   DIVERTICULOSIS, COLON 09/30/2008   DYSPNEA 07/13/2008   [place] disease    History of embolic stroke 11/01/8339   Left brain   HYPERLIPIDEMIA 03/06/2007   HYPERTENSION 03/06/2007   HYPOTHYROIDISM 10/13/2007   OSTEOARTHRITIS 03/06/2007   Personal history of colonic polyps 09/30/2008   Stroke ([place])     06/2014            TOBACCO USE, QUIT 04/12/2009   WEAKNESS 11/09/2007   Family History  Problem Relation Age of Onset   Stomach cancer Maternal Grandmother    Colon cancer Neg Hx    Social History:   reports that she quit smoking about 42 years ago. Her smoking use included cigarettes. She started smoking about 71 years ago. She has a 43.50 pack-year smoking history. She has never used smokeless tobacco. She reports current alcohol use of about 1.0 standard drink of alcohol per week. She reports that she does not use drugs.  Medications  Current Facility-Administered Medications:    albuterol (PROVENTIL) (2.5 MG/3ML) 0.083% nebulizer solution 2.5 mg, 2.5 mg, Inhalation, Q6H PRN, Opyd, Ilene Qua, MD   ALPRAZolam Duanne Moron) tablet 0.5 mg, 0.5 mg, Oral, BID, Long, Wonda Olds, MD   ALPRAZolam Duanne Moron) tablet 0.5 mg, 0.5 mg, Oral, TID PRN, Long, Wonda Olds, MD, 0.5 mg at 12/24/20 1503   amLODipine (NORVASC) tablet 5 mg, 5 mg, Oral, Daily, Long, Wonda Olds, MD, 5 mg at 12/24/20 0758   atorvastatin (LIPITOR) tablet 80 mg, 80 mg, Oral, Daily, Long, Wonda Olds, MD   Chlorhexidine Gluconate Cloth 2 % PADS 6 each, 6 each, Topical, Daily, Opyd, Ilene Qua, MD, 6 each at 12/24/20 2000   furosemide (LASIX) tablet 20 mg, 20 mg, Oral, Daily, Long, Wonda Olds, MD, 20 mg at 12/24/20 0758   hydrALAZINE (APRESOLINE) tablet 50 mg, 50 mg, Oral, TID, Long, Wonda Olds, MD,  50 mg at 12/24/20 0757   ipratropium-albuterol (DUONEB) 0.5-2.5 (3) MG/3ML nebulizer solution 3 mL, 3 mL, Nebulization, Q6H PRN, Long, Wonda Olds, MD   isosorbide mononitrate (IMDUR) 24 hr tablet 30 mg, 30 mg, Oral, Daily, Long, Wonda Olds, MD   latanoprost (XALATAN) 0.005 % ophthalmic solution 1 drop, 1 drop, Both Eyes, QHS, Long, Wonda Olds, MD   levothyroxine (SYNTHROID) tablet 75 mcg, 75 mcg, Oral, QAC breakfast, Long, Wonda Olds, MD, 75 mcg at 12/24/20 0757   pantoprazole (PROTONIX) EC tablet 40 mg, 40 mg, Oral, Daily, Long, Wonda Olds, MD, 40 mg at 12/24/20 1502    potassium chloride SA (KLOR-CON) CR tablet 20 mEq, 20 mEq, Oral, Daily, Long, Wonda Olds, MD, 20 mEq at 12/24/20 0757   predniSONE (DELTASONE) tablet 5 mg, 5 mg, Oral, Q breakfast, Long, Wonda Olds, MD, 5 mg at 12/24/20 0757   senna-docusate (Senokot-S) tablet 2 tablet, 2 tablet, Oral, QHS, Kayleen Memos, DO   Exam: Current vital signs: BP (!) 144/60 (BP Location: Right Arm)   Pulse 67   Temp 98.2 F (36.8 C) (Oral)   Resp 20   Ht 5\' 6"  (1.676 m)   Wt 58.8 kg   SpO2 100%   BMI 20.92 kg/m  Vital signs in last 24 hours: Temp:  [97.5 F (36.4 C)-98.2 F (36.8 C)] 98.2 F (36.8 C) (06/19 2059) Pulse Rate:  [43-70] 67 (06/19 2059) Resp:  [13-28] 20 (06/19 2059) BP: (116-184)/(53-144) 144/60 (06/19 2059) SpO2:  [91 %-100 %] 100 % (06/19 2059) General: Awake alert in no distress HEENT: Normocephalic/atraumatic Lungs: Scattered wheezing CVS: Regular rhythm Abdomen nondistended nontender Extremities warm well perfused Neurological exam Awake alert oriented x3 Speech is moderately dysarthric-question if that is because of her dentures or true dysarthria No aphasia Cranial nerves II to XII intact Motor examination with mild weakness of the right upper and right lower extremity.  Right lower extremity examination is more limited due to pain and hyperesthesia.  Left upper and lower extremity normal strength. Sensory exam: Difficult to perform given extreme hyperesthesia in the right leg. Coordination: No obvious dysmetria but slow to perform coordination testing right upper and right lower extremity compared to left upper left lower extremity. NIH stroke scale 1a Level of Conscious.: 0 1b LOC Questions: 0 1c LOC Commands: 0 2 Best Gaze: 0 3 Visual: 0 4 Facial Palsy: 0 5a Motor Arm - left: 0 5b Motor Arm - Right: 1 6a Motor Leg - Left: 0 6b Motor Leg - Right: 0 7 Limb Ataxia: 1 8 Sensory: 0 9 Best Language: 0 10 Dysarthria: 1 11 Extinct. and Inatten.: 0 TOTAL: 3  Labs I have  reviewed labs in epic and the results pertinent to this consultation are:  CBC    Component Value Date/Time   WBC 6.1 12/23/2020 1942   RBC 4.12 12/23/2020 1942   HGB 12.6 12/23/2020 1942   HGB 13.3 08/20/2017 1430   HCT 39.0 12/23/2020 1942   HCT 38.9 08/20/2017 1430   PLT 147 (L) 12/23/2020 1942   PLT 213 08/20/2017 1430   MCV 94.7 12/23/2020 1942   MCV 92 08/20/2017 1430   MCH 30.6 12/23/2020 1942   MCHC 32.3 12/23/2020 1942   RDW 13.5 12/23/2020 1942   RDW 14.1 08/20/2017 1430   LYMPHSABS 0.9 12/23/2020 1942   MONOABS 0.4 12/23/2020 1942   EOSABS 0.0 12/23/2020 1942   BASOSABS 0.1 12/23/2020 1942    CMP     Component Value Date/Time  NA 139 12/23/2020 1942   NA 138 09/07/2020 0931   K 3.8 12/23/2020 1942   CL 102 12/23/2020 1942   CO2 26 12/23/2020 1942   GLUCOSE 168 (H) 12/23/2020 1942   BUN 22 12/23/2020 1942   BUN 30 09/07/2020 0931   CREATININE 1.27 (H) 12/23/2020 1942   CALCIUM 9.0 12/23/2020 1942   PROT 6.4 (L) 12/23/2020 1942   PROT 6.3 03/24/2019 1539   ALBUMIN 3.9 12/23/2020 1942   ALBUMIN 4.3 03/24/2019 1539   AST 19 12/23/2020 1942   ALT 15 12/23/2020 1942   ALKPHOS 93 12/23/2020 1942   BILITOT 0.6 12/23/2020 1942   BILITOT 0.7 03/24/2019 1539   GFRNONAA 39 (L) 12/23/2020 1942   GFRAA 57 (L) 03/27/2020 2042    Lipid Panel     Component Value Date/Time   CHOL 218 (H) 03/24/2019 1539   TRIG 73 03/24/2019 1539   HDL 111 03/24/2019 1539   CHOLHDL 2.0 03/24/2019 1539   CHOLHDL 1.9 01/13/2018 0027   VLDL 6 01/13/2018 0027   LDLCALC 94 03/24/2019 1539   LDLDIRECT 141.7 05/01/2011 1037     Imaging I have reviewed the images obtained CT head with no acute changes CTA head and neck with no emergent LVO. Increase size of left ICA ophthalmic/clinoid segment aneurysm-previous measurement 1 x 1 cm, today's measurement 1.7 x 1.4 cm.  Unchanged fusiform dilatation of the proximal cavernous segment measuring 7 mm.  Status post left carotid  endarterectomy without stenosis-40% stenosis of the proximal right internal carotid artery by NASCET criteria.  Assessment: 87 year old woman with above past medical history presenting with acute onset of right-sided weakness, that started at least 3 days ago. Given her current clinical exam with mild right hemiparesis and some dysarthria, could either be brainstem stroke or lacunar infarction in the posterior limb of the internal capsule with predominantly motor symptoms. Needs full stroke work-up  Impression: Acute ischemic stroke  Recommendations: Admit to hospitalist Frequent neurochecks Telemetry MRI brain without contrast Aspirin High-intensity statin Lipid panel A1c 2D echo PT OT Speech therapy N.p.o. until cleared by bedside swallow evaluation or formal swallow evaluation. No need for permissive hypertension at this time Stroke team will follow. Plan relayed to Dr. Nevada Crane, admitting  -- Amie Portland, MD Neurologist [facility] Pager: [phone]     @[email or web address] @

## 2020-12-25 ENCOUNTER — Observation Stay (HOSPITAL_COMMUNITY): Payer: Medicare PPO

## 2020-12-25 ENCOUNTER — Encounter: Payer: Self-pay | Admitting: Adult Health

## 2020-12-25 DIAGNOSIS — Z87891 Personal history of nicotine dependence: Secondary | ICD-10-CM | POA: Diagnosis not present

## 2020-12-25 DIAGNOSIS — E1122 Type 2 diabetes mellitus with diabetic chronic kidney disease: Secondary | ICD-10-CM | POA: Diagnosis present

## 2020-12-25 DIAGNOSIS — I6330 Cerebral infarction due to thrombosis of unspecified cerebral artery: Secondary | ICD-10-CM | POA: Diagnosis present

## 2020-12-25 DIAGNOSIS — E785 Hyperlipidemia, unspecified: Secondary | ICD-10-CM | POA: Diagnosis present

## 2020-12-25 DIAGNOSIS — K5900 Constipation, unspecified: Secondary | ICD-10-CM | POA: Diagnosis present

## 2020-12-25 DIAGNOSIS — Z66 Do not resuscitate: Secondary | ICD-10-CM | POA: Diagnosis present

## 2020-12-25 DIAGNOSIS — R29898 Other symptoms and signs involving the musculoskeletal system: Secondary | ICD-10-CM

## 2020-12-25 DIAGNOSIS — I6389 Other cerebral infarction: Secondary | ICD-10-CM

## 2020-12-25 DIAGNOSIS — Z20822 Contact with and (suspected) exposure to covid-19: Secondary | ICD-10-CM | POA: Diagnosis present

## 2020-12-25 DIAGNOSIS — I480 Paroxysmal atrial fibrillation: Secondary | ICD-10-CM | POA: Diagnosis present

## 2020-12-25 DIAGNOSIS — R471 Dysarthria and anarthria: Secondary | ICD-10-CM | POA: Diagnosis present

## 2020-12-25 DIAGNOSIS — N1832 Chronic kidney disease, stage 3b: Secondary | ICD-10-CM | POA: Diagnosis present

## 2020-12-25 DIAGNOSIS — I130 Hypertensive heart and chronic kidney disease with heart failure and stage 1 through stage 4 chronic kidney disease, or unspecified chronic kidney disease: Secondary | ICD-10-CM | POA: Diagnosis present

## 2020-12-25 DIAGNOSIS — Z888 Allergy status to other drugs, medicaments and biological substances status: Secondary | ICD-10-CM | POA: Diagnosis not present

## 2020-12-25 DIAGNOSIS — J449 Chronic obstructive pulmonary disease, unspecified: Secondary | ICD-10-CM | POA: Diagnosis present

## 2020-12-25 DIAGNOSIS — R29703 NIHSS score 3: Secondary | ICD-10-CM | POA: Diagnosis present

## 2020-12-25 DIAGNOSIS — E039 Hypothyroidism, unspecified: Secondary | ICD-10-CM | POA: Diagnosis present

## 2020-12-25 DIAGNOSIS — I671 Cerebral aneurysm, nonruptured: Secondary | ICD-10-CM | POA: Diagnosis present

## 2020-12-25 DIAGNOSIS — F419 Anxiety disorder, unspecified: Secondary | ICD-10-CM | POA: Diagnosis present

## 2020-12-25 DIAGNOSIS — Z7952 Long term (current) use of systemic steroids: Secondary | ICD-10-CM | POA: Diagnosis not present

## 2020-12-25 DIAGNOSIS — E1165 Type 2 diabetes mellitus with hyperglycemia: Secondary | ICD-10-CM | POA: Diagnosis present

## 2020-12-25 DIAGNOSIS — Z8673 Personal history of transient ischemic attack (TIA), and cerebral infarction without residual deficits: Secondary | ICD-10-CM | POA: Diagnosis not present

## 2020-12-25 DIAGNOSIS — K219 Gastro-esophageal reflux disease without esophagitis: Secondary | ICD-10-CM | POA: Diagnosis present

## 2020-12-25 DIAGNOSIS — R29810 Facial weakness: Secondary | ICD-10-CM | POA: Diagnosis present

## 2020-12-25 DIAGNOSIS — G8191 Hemiplegia, unspecified affecting right dominant side: Secondary | ICD-10-CM | POA: Diagnosis present

## 2020-12-25 DIAGNOSIS — I5042 Chronic combined systolic (congestive) and diastolic (congestive) heart failure: Secondary | ICD-10-CM | POA: Diagnosis present

## 2020-12-25 DIAGNOSIS — R339 Retention of urine, unspecified: Secondary | ICD-10-CM | POA: Diagnosis present

## 2020-12-25 DIAGNOSIS — R531 Weakness: Secondary | ICD-10-CM | POA: Diagnosis not present

## 2020-12-25 LAB — CBC
HCT: 34.8 % — ABNORMAL LOW (ref 36.0–46.0)
Hemoglobin: 11.2 g/dL — ABNORMAL LOW (ref 12.0–15.0)
MCH: 30.9 pg (ref 26.0–34.0)
MCHC: 32.2 g/dL (ref 30.0–36.0)
MCV: 95.9 fL (ref 80.0–100.0)
Platelets: 123 10*3/uL — ABNORMAL LOW (ref 150–400)
RBC: 3.63 MIL/uL — ABNORMAL LOW (ref 3.87–5.11)
RDW: 13.3 % (ref 11.5–15.5)
WBC: 5.5 10*3/uL (ref 4.0–10.5)
nRBC: 0 % (ref 0.0–0.2)

## 2020-12-25 LAB — BASIC METABOLIC PANEL
Anion gap: 7 (ref 5–15)
BUN: 19 mg/dL (ref 8–23)
CO2: 28 mmol/L (ref 22–32)
Calcium: 8.6 mg/dL — ABNORMAL LOW (ref 8.9–10.3)
Chloride: 104 mmol/L (ref 98–111)
Creatinine, Ser: 1.17 mg/dL — ABNORMAL HIGH (ref 0.44–1.00)
GFR, Estimated: 44 mL/min — ABNORMAL LOW (ref >60)
Glucose, Bld: 91 mg/dL (ref 70–99)
Potassium: 3.6 mmol/L (ref 3.5–5.1)
Sodium: 139 mmol/L (ref 135–145)

## 2020-12-25 LAB — GLUCOSE, CAPILLARY
Glucose-Capillary: 90 mg/dL (ref 70–99)
Glucose-Capillary: 84 mg/dL (ref 70–99)
Glucose-Capillary: 117 mg/dL — ABNORMAL HIGH (ref 70–99)
Glucose-Capillary: 119 mg/dL — ABNORMAL HIGH (ref 70–99)
Glucose-Capillary: 129 mg/dL — ABNORMAL HIGH (ref 70–99)

## 2020-12-25 LAB — ECHOCARDIOGRAM COMPLETE
AR max vel: 0.93 cm2
AV Area VTI: 0.98 cm2
AV Area mean vel: 0.88 cm2
AV Mean grad: 15.7 mmHg
AV Peak grad: 29.6 mmHg
Ao pk vel: 2.72 m/s
Area-P 1/2: 2.62 cm2
Calc EF: 42.9 %
MV VTI: 1.56 cm2
S' Lateral: 3.3 cm
Single Plane A2C EF: 33.7 %
Single Plane A4C EF: 50.6 %

## 2020-12-25 LAB — HEMOGLOBIN A1C
Hgb A1c MFr Bld: 5.8 % — ABNORMAL HIGH (ref 4.8–5.6)
Mean Plasma Glucose: 120 mg/dL

## 2020-12-25 LAB — LIPID PANEL
Cholesterol: 159 mg/dL (ref 0–200)
HDL: 74 mg/dL (ref >40)
LDL Cholesterol: 74 mg/dL (ref 0–99)
Total CHOL/HDL Ratio: 2.1 RATIO
Triglycerides: 56 mg/dL (ref <150)
VLDL: 11 mg/dL (ref 0–40)

## 2020-12-25 LAB — MAGNESIUM: Magnesium: 2.1 mg/dL (ref 1.7–2.4)

## 2020-12-25 LAB — PHOSPHORUS: Phosphorus: 3.6 mg/dL (ref 2.5–4.6)

## 2020-12-25 MED ORDER — POLYVINYL ALCOHOL 1.4 % OP SOLN
1.0000 [drp] | OPHTHALMIC | Status: DC | PRN
  Administered 2020-12-25: 1 [drp] via OPHTHALMIC
  Filled 2020-12-25: qty 15

## 2020-12-25 NOTE — Progress Notes (Signed)
STROKE TEAM PROGRESS NOTE   INTERVAL HISTORY  No visitors at bedside.  Uses walker at home, lives alone, PT/OT ongoing once a week. Has assistance with cooking twice a week. RN visits, Children live in town with one daughter across the street. [name] is the daughter to call planning.  We discussed her stroke diagnosis, ongoing work up and plan of care. Daughter was updated by phone as below. She feels patient needs rehab setting at discharge.   Vitals:   12/24/20 1753 12/24/20 2059 12/25/20 0523 12/25/20 0858  BP: (!) 156/64 (!) 144/60 (!) 156/69 138/61  Pulse: 63 67 (!) 52   Resp: 20 20 18    Temp: (!) 97.5 F (36.4 C) 98.2 F (36.8 C) 98 F (36.7 C)   TempSrc:  Oral Oral   SpO2: 95% 100% 96%   Weight:      Height:       CBC:  Recent Labs  Lab 12/23/20 1942 12/25/20 0432  WBC 6.1 5.5  NEUTROABS 4.7  --   HGB 12.6 11.2*  HCT 39.0 34.8*  MCV 94.7 95.9  PLT 147* 166*   Basic Metabolic Panel:  Recent Labs  Lab 12/23/20 1942 12/25/20 0432  NA 139 139  K 3.8 3.6  CL 102 104  CO2 26 28  GLUCOSE 168* 91  BUN 22 19  CREATININE 1.27* 1.17*  CALCIUM 9.0 8.6*  MG  --  2.1  PHOS  --  3.6   Lipid Panel:  Recent Labs  Lab 12/25/20 0432  CHOL 159  TRIG 56  HDL 74  CHOLHDL 2.1  VLDL 11  LDLCALC 74   HgbA1c: No results for input(s): HGBA1C in the last 168 hours. Urine Drug Screen:  Recent Labs  Lab 12/23/20 1942  LABOPIA NONE DETECTED  COCAINSCRNUR NONE DETECTED  LABBENZ POSITIVE*  AMPHETMU NONE DETECTED  THCU NONE DETECTED  LABBARB NONE DETECTED    Alcohol Level  Recent Labs  Lab 12/23/20 1942  ETH 13*    IMAGING past 24 hours MR BRAIN WO CONTRAST  Result Date: 12/25/2020 CLINICAL DATA:  Right-sided weakness EXAM: MRI HEAD WITHOUT CONTRAST TECHNIQUE: Multiplanar, multiecho pulse sequences of the brain and surrounding structures were obtained without intravenous contrast. COMPARISON:  11/28/2020 FINDINGS: Brain: There are punctate foci of abnormal  diffusion restriction in both frontal lobes and in the left occipital lobe. Minimal chronic microhemorrhage. There is multifocal hyperintense T2-weighted signal within the white matter. Generalized volume loss without a clear lobar predilection. The midline structures are normal. Vascular: Left ICA aneurysm again demonstrated. Skull and upper cervical spine: Normal calvarium and skull base. Narrowing of the cervical spinal at the C2 level. Sinuses/Orbits:No paranasal sinus fluid levels or advanced mucosal thickening. Right mastoid effusion. Normal orbits. IMPRESSION: 1. Punctate foci of acute ischemia in both frontal lobes and left occipital lobe. 2. No acute hemorrhage or mass effect. 3. Chronic ischemic microangiopathy and generalized volume loss. 4. Left ICA aneurysm, unchanged Electronically Signed   By: Ulyses Jarred M.D.   On: 12/25/2020 03:01    PHYSICAL EXAM General: Frail elderly malnourished looking Caucasian lady not in distress awake alert in no distress HEENT: Normocephalic/atraumatic Lungs: Scattered wheezing CVS: Regular rhythm Abdomen nondistended nontender Extremities warm well perfused Neurological exam Awake alert oriented x3 Speech is mildly dysarthric- No aphasia Cranial nerves II to XII intact Motor examination with mild weakness of the right upper and right lower extremity.  Right lower extremity examination is more limited due to pain and hyperesthesia.  Left  upper and lower extremity normal strength. Sensory exam: Difficult to perform given extreme hyperesthesia in the right leg. Coordination: No obvious dysmetria but slow to perform coordination testing right upper and right lower extremity compared to left upper left lower extremity. NIH stroke scale 1a Level of Conscious.: 0 1b LOC Questions: 0 1c LOC Commands: 0 2 Best Gaze: 0 3 Visual: 0 4 Facial Palsy: 0 5a Motor Arm - left: 0 5b Motor Arm - Right: 1 6a Motor Leg - Left: 0 6b Motor Leg - Right: 0 7 Limb  Ataxia: 1 8 Sensory: 0 9 Best Language: 0 10 Dysarthria: 1 11 Extinct. and Inatten.: 0 TOTAL: 3  ASSESSMENT/PLAN [name] is a 87 y.o. female with history of chronic kidney disease, COPD, hypertension, hyperlipidemia, stroke in 2015, history of tobacco abuse in the past, degenerative disc disease, atherosclerotic aortic arch, anxiety, presented to the emergency room for evaluation of right-sided weakness that started 2 days prior to presentation.   Punctate stroke n both frontal lobes and left occipital lobe likely due to atrial fibrillation not on anticoagulation  Code Stroke CT head No acute abnormality. Small vessel disease. Atrophy. ASPECTS 10.    CTA head & neck  1. No emergent large vessel occlusion or high-grade stenosis of the intracranial arteries. 2. Increased size of left ICA ophthalmic/clinoid segment aneurysm, now measuring 1.7 x 1.4 cm, previously 1.0 x 1.0 cm. 3. Unchanged fusiform dilatation of the proximal cavernous segment measuring 7 mm. 4. Status post left carotid endarterectomy without stenosis. 40% stenosis of the proximal right internal carotid artery by NASCET criteria. MRI: 1. Punctate foci of acute ischemia in both frontal lobes and left occipital lobe. 2. No acute hemorrhage or mass effect. 3. Chronic ischemic microangiopathy and generalized volume loss. 4. Left ICA aneurysm, unchanged  2D Echo shows EF 40-45% LDL 74 HgbA1c 6.1 VTE prophylaxis - SCDs    Diet   Diet heart healthy/carb modified Room service appropriate? Yes; Fluid consistency: Thin   No AC/AP prior to admission: Per Dr. Kyla Balzarine March 2022 office visit: "She was to have L CEA on May of 2017 - looks like this complicated by a vocal cord paralysis and "with possible TE from extensive aortic arch atherosclerosis".  She was reluctant to start anticoagulation.  She elected to take ASA and Plavix instead.  But then was admitted with profound anemia in the setting of aspirin and  Plavix - transfused - had EGD and noted large AVM that was clipped. She is to stay just on aspirin despite being at risk for embolism from the aortic arch atherosclerosis." Ideally, she would be on anticoagulation. Discussed anticoagulation recommendation extensivley with daughter, [name] by phone. She will be at bedside tomorrow to further discuss.  Therapy recommendations:  Pending Has palliative care following at home after 5 ER visits in two weeks recently. Has had some panic type episodes. Patient is DNR.  Disposition:  TBD  Hypertension Stable Permissive hypertension (OK if < 220/120) but gradually normalize in 5-7 days Long-term BP goal normotensive  Hyperlipidemia LDL 74, almost at goal < 70 at home dose of lipitor 80mg , continue high intensity statin  Other Stroke Risk Factors Advanced Age >/= 23  Former Cigarette smoker, 43 pack years  Hx stroke/TIA 2015 Obstructive sleep apnea, on CPAP at home Congestive heart failure  Other Active Problems   Hospital day # 0 I have personally obtained history,examined this patient, reviewed notes, independently viewed imaging studies, participated in medical decision making and plan  of care.ROS completed by me personally and pertinent positives fully documented  I have made any additions or clarifications directly to the above note. Agree with note above.  Patient presented with dysarthria and left hemiparesis with MRI showing bifrontal and left occipital embolic infarcts likely from A. fib not on anticoagulation in the past due to GI hemorrhage.  Patient also has a terminal left ICA aneurysm which appears to have increased slightly in size.  Given her age she is likely not a candidate for aggressive intervention with stent coiling.  Nurse practitioner spoke to the patient's daughter who does not want aggressive treatment at the present time but will be available tomorrow morning for discussion about goals of care.  If family wants to be aggressive  would recommend Eliquis for stroke prevention but at her age I agree she is not a candidate for endovascular treatment of her abdominal mass aneurysm.  Greater than 50% time during this 35-minute visit was spent in counseling and coordination of care and discussion with care team.  Discussed with Dr. Su Grand,  Antony Contras, MD Medical Director [place] Pager: [phone] 12/25/2020 5:59 PM   To contact Stroke Continuity provider, please refer to [email or web address] After hours, contact General Neurology

## 2020-12-25 NOTE — Plan of Care (Signed)

## 2020-12-25 NOTE — Progress Notes (Signed)
PROGRESS NOTE    [name]  UEA:[id number] DOB: 28-Feb-1925 DOA: 12/23/2020 PCP: [name], NP   Brief Narrative:  This 87 years old female with PMH significant for prior CVA in 2015, former tobacco user, quit 4 years ago, COPD, CKD stage IIIb, chronic anxiety, hypertension, hyperlipidemia, hypothyroidism, GERD presented in the ED with right-sided weakness for 2 days.  Patient reports noticing dropping things out of her right hand and dragging her right lower extremity.  She uses walker at baseline. Patient is admitted for stroke work-up.  She is out of tPA window.  CT head revealed no acute changes.  CTA head and neck revealed increased size of left ICA ophthalmic/glenoid segment aneurysm from 1 x1 cm to 1.7 x 1.4 cm. MRI Brain: Punctate foci of acute ischemia in both frontal lobes and left occipital lobe.No acute hemorrhage or mass effect.  Assessment & Plan:   Active Problems:   Right hand weakness   Cerebral thrombosis with cerebral infarction  Right hand/right-sided weakness : She presented with right hand weakness, dropping things associated with mild right lower extremity weakness.   Last known well 12/21/2020. Outside of tPA window CT head revealed no acute changes, CTA head and neck revealed increased size of left ICA ophthalmic segment aneurysm. MRI : Punctate foci of acute ischemia in both frontal lobes and left occipital lobe. Obtain 2D echocardiogram. Lipid/A1c : LDL 74 PT/OT/speech therapist assessment Frequent neurochecks Continue Aspirin, high intensity statin No need for permissive hypertension per neurology Follow-up neurology.  Recommended stroke work-up.   Left ICA ophthalmic/clinoid segment aneurysm from 1 x 1 cm to 1.7 x 1.4 cm. MRI reported unchanged aneurysm.   Essential hypertension BP stable. Continue home medications.  Type 2 diabetes: Obtain hemoglobin A1c Goal hemoglobin A1c less than 7.0. Continue Insulin sliding scale    Hyperlipidemia Goal LDL less than 70.  Continue statins.   Combined chronic diastolic and systolic CHF Last 2D echo from 08/31/2020 revealed LVEF 40 to 45% and grade 1 diastolic dysfunction. Euvolemic on exam. Resume home regimen Strict I's and O's and daily weight   Hypothyroidism: Continue levothyroxine   COPD on chronic steroid use Currently stable Continue inhalers.   Chronic anxiety Continue Xanax   GERD/constipation Continue Protonix/Colace   Ambulatory dysfunction Uses a walker to ambulate PT and OT evaluation    DVT prophylaxis:  Lovenox Code Status:  DNR Family Communication: Daughter @ bed side. Disposition Plan:    Status is: Observation  The patient remains OBS appropriate and will d/c before 2 midnights.  Dispo:  Patient From: Home  Planned Disposition: Home  Medically stable for discharge: No      Consultants:  Neurology  Procedures: CTA head and neck, MRI  Antimicrobials:   Anti-infectives (From admission, onward)    None        Subjective: Patient was seen and examined at bedside.  Overnight events noted.   Patient was sitting comfortably on the bed, reports feeling improved.  She still reports feeling weak on the right side.  Objective: Vitals:   12/24/20 1753 12/24/20 2059 12/25/20 0523 12/25/20 0858  BP: (!) 156/64 (!) 144/60 (!) 156/69 138/61  Pulse: 63 67 (!) 52   Resp: 20 20 18    Temp: (!) 97.5 F (36.4 C) 98.2 F (36.8 C) 98 F (36.7 C)   TempSrc:  Oral Oral   SpO2: 95% 100% 96%   Weight:      Height:        Intake/Output Summary (Last  24 hours) at 12/25/2020 1107 Last data filed at 12/24/2020 1314 Gross per 24 hour  Intake --  Output 1200 ml  Net -1200 ml   Filed Weights   12/23/20 1947  Weight: 58.8 kg    Examination:  General exam: Appears calm and comfortable , not in any acute distress.  Appears cheerful. Respiratory system: Clear to auscultation. Respiratory effort normal. Cardiovascular system:  S1 & S2 heard, RRR. No JVD, murmurs, rubs, gallops or clicks. No pedal edema. Gastrointestinal system: Abdomen is nondistended, soft and nontender. No organomegaly or masses felt. Normal bowel sounds heard. Central nervous system: Alert and oriented.  Right upper and lower extremity weakness. Extremities: Right upper and lower extremity weakness.  No edema, no cyanosis, no clubbing. Skin: No rashes, lesions or ulcers Psychiatry: Judgement and insight appear normal. Mood & affect appropriate.     Data Reviewed: I have personally reviewed following labs and imaging studies  CBC: Recent Labs  Lab 12/23/20 1942 12/25/20 0432  WBC 6.1 5.5  NEUTROABS 4.7  --   HGB 12.6 11.2*  HCT 39.0 34.8*  MCV 94.7 95.9  PLT 147* 902*   Basic Metabolic Panel: Recent Labs  Lab 12/23/20 1942 12/25/20 0432  NA 139 139  K 3.8 3.6  CL 102 104  CO2 26 28  GLUCOSE 168* 91  BUN 22 19  CREATININE 1.27* 1.17*  CALCIUM 9.0 8.6*  MG  --  2.1  PHOS  --  3.6   GFR: Estimated Creatinine Clearance: 27.9 mL/min (A) (by C-G formula based on SCr of 1.17 mg/dL (H)). Liver Function Tests: Recent Labs  Lab 12/23/20 1942  AST 19  ALT 15  ALKPHOS 93  BILITOT 0.6  PROT 6.4*  ALBUMIN 3.9   No results for input(s): LIPASE, AMYLASE in the last 168 hours. No results for input(s): AMMONIA in the last 168 hours. Coagulation Profile: Recent Labs  Lab 12/23/20 1942  INR 1.0   Cardiac Enzymes: No results for input(s): CKTOTAL, CKMB, CKMBINDEX, TROPONINI in the last 168 hours. BNP (last 3 results) No results for input(s): PROBNP in the last 8760 hours. HbA1C: No results for input(s): HGBA1C in the last 72 hours. CBG: Recent Labs  Lab 12/25/20 0100 12/25/20 0744  GLUCAP 90 84   Lipid Profile: Recent Labs    12/25/20 0432  CHOL 159  HDL 74  LDLCALC 74  TRIG 56  CHOLHDL 2.1   Thyroid Function Tests: No results for input(s): TSH, T4TOTAL, FREET4, T3FREE, THYROIDAB in the last 72  hours. Anemia Panel: No results for input(s): VITAMINB12, FOLATE, FERRITIN, TIBC, IRON, RETICCTPCT in the last 72 hours. Sepsis Labs: No results for input(s): PROCALCITON, LATICACIDVEN in the last 168 hours.  Recent Results (from the past 240 hour(s))  Resp Panel by RT-PCR (Flu A&B, Covid) Nasopharyngeal Swab     Status: None   Collection Time: 12/23/20  7:42 PM   Specimen: Nasopharyngeal Swab; Nasopharyngeal(NP) swabs in vial transport medium  Result Value Ref Range Status   SARS Coronavirus 2 by RT PCR NEGATIVE NEGATIVE Final    Comment: (NOTE) SARS-CoV-2 target nucleic acids are NOT DETECTED.  The SARS-CoV-2 RNA is generally detectable in upper respiratory specimens during the acute phase of infection. The lowest concentration of SARS-CoV-2 viral copies this assay can detect is 138 copies/mL. A negative result does not preclude SARS-Cov-2 infection and should not be used as the sole basis for treatment or other patient management decisions. A negative result may occur with  improper specimen collection/handling,  submission of specimen other than nasopharyngeal swab, presence of viral mutation(s) within the areas targeted by this assay, and inadequate number of viral copies(<138 copies/mL). A negative result must be combined with clinical observations, patient history, and epidemiological information. The expected result is Negative.  Fact Sheet for Patients:  EntrepreneurPulse.com.au  Fact Sheet for Healthcare Providers:  IncredibleEmployment.be  This test is no t yet approved or cleared by the [place] FDA and  has been authorized for detection and/or diagnosis of SARS-CoV-2 by FDA under an Emergency Use Authorization (EUA). This EUA will remain  in effect (meaning this test can be used) for the duration of the COVID-19 declaration under Section 564(b)(1) of the Act, 21 U.S.C.section 360bbb-3(b)(1), unless the authorization is  terminated  or revoked sooner.       Influenza A by PCR NEGATIVE NEGATIVE Final   Influenza B by PCR NEGATIVE NEGATIVE Final    Comment: (NOTE) The Xpert Xpress SARS-CoV-2/FLU/RSV plus assay is intended as an aid in the diagnosis of influenza from Nasopharyngeal swab specimens and should not be used as a sole basis for treatment. Nasal washings and aspirates are unacceptable for Xpert Xpress SARS-CoV-2/FLU/RSV testing.  Fact Sheet for Patients: EntrepreneurPulse.com.au  Fact Sheet for Healthcare Providers: IncredibleEmployment.be  This test is not yet approved or cleared by the [place] FDA and has been authorized for detection and/or diagnosis of SARS-CoV-2 by FDA under an Emergency Use Authorization (EUA). This EUA will remain in effect (meaning this test can be used) for the duration of the COVID-19 declaration under Section 564(b)(1) of the Act, 21 U.S.C. section 360bbb-3(b)(1), unless the authorization is terminated or revoked.  Performed at [facility], [address], [place],  [address]     Radiology Studies: CT Angio Head W or Wo Contrast  Result Date: 12/23/2020 CLINICAL DATA:  Right hand weakness EXAM: CT ANGIOGRAPHY HEAD AND NECK TECHNIQUE: Multidetector CT imaging of the head and neck was performed using the standard protocol during bolus administration of intravenous contrast. Multiplanar CT image reconstructions and MIPs were obtained to evaluate the vascular anatomy. Carotid stenosis measurements (when applicable) are obtained utilizing NASCET criteria, using the distal internal carotid diameter as the denominator. CONTRAST:  34mL OMNIPAQUE IOHEXOL 350 MG/ML SOLN COMPARISON:  11/21/2015 FINDINGS: CT HEAD FINDINGS Brain: There is no mass, hemorrhage or extra-axial collection. The size and configuration of the ventricles and extra-axial CSF spaces are normal. There is hypoattenuation of the  periventricular white matter, most commonly indicating chronic ischemic microangiopathy. Skull: The visualized skull base, calvarium and extracranial soft tissues are normal. Sinuses/Orbits: No fluid levels or advanced mucosal thickening of the visualized paranasal sinuses. No mastoid or middle ear effusion. The orbits are normal. CTA NECK FINDINGS SKELETON: Multilevel degenerative disc disease and facet hypertrophy without bony spinal canal stenosis. OTHER NECK: Normal pharynx, larynx and major salivary glands. No cervical lymphadenopathy. Unremarkable thyroid gland. UPPER CHEST: Biapical emphysema. AORTIC ARCH: There a large amount of soft and calcific atherosclerosis of the aortic arch. There is no aneurysm, dissection or hemodynamically significant stenosis of the visualized portion of the aorta. Conventional 3 vessel aortic branching pattern. The visualized proximal subclavian arteries are widely patent. RIGHT CAROTID SYSTEM: No dissection, occlusion or aneurysm. There is calcific atherosclerosis extending into the proximal ICA, resulting in 40% stenosis. LEFT CAROTID SYSTEM: 1.6 cm dilatation of the carotid bulb is unchanged. No stenosis. Status post endarterectomy. VERTEBRAL ARTERIES: Right dominant configuration. Both origins are clearly patent. There is no dissection, occlusion or flow-limiting stenosis  to the skull base (V1-V3 segments). CTA HEAD FINDINGS POSTERIOR CIRCULATION: --Vertebral arteries: Atherosclerotic calcification on the right without stenosis. --Inferior cerebellar arteries: Normal. --Basilar artery: Normal. --Superior cerebellar arteries: Normal. --Posterior cerebral arteries (PCA): Normal. ANTERIOR CIRCULATION: --Intracranial internal carotid arteries: Aneurysm of the left ophthalmic/clinoid segment measures 1.7 x 1.4 cm, previously 1.0 x 1.0 cm. There is fusiform aneurysmal dilatation of the proximal cavernous segment measuring 7 mm. --Anterior cerebral arteries (ACA): Normal. Absent  right A1 segment, normal variant --Middle cerebral arteries (MCA): Normal. VENOUS SINUSES: As permitted by contrast timing, patent. ANATOMIC VARIANTS: None Review of the MIP images confirms the above findings. IMPRESSION: 1. No emergent large vessel occlusion or high-grade stenosis of the intracranial arteries. 2. Increased size of left ICA ophthalmic/clinoid segment aneurysm, now measuring 1.7 x 1.4 cm, previously 1.0 x 1.0 cm. 3. Unchanged fusiform dilatation of the proximal cavernous segment measuring 7 mm. 4. Status post left carotid endarterectomy without stenosis. 40% stenosis of the proximal right internal carotid artery by NASCET criteria. Aortic Atherosclerosis (ICD10-I70.0) and Emphysema (ICD10-J43.9). Electronically Signed   By: Ulyses Jarred M.D.   On: 12/23/2020 21:35   CT Angio Neck W and/or Wo Contrast  Result Date: 12/23/2020 CLINICAL DATA:  Right hand weakness EXAM: CT ANGIOGRAPHY HEAD AND NECK TECHNIQUE: Multidetector CT imaging of the head and neck was performed using the standard protocol during bolus administration of intravenous contrast. Multiplanar CT image reconstructions and MIPs were obtained to evaluate the vascular anatomy. Carotid stenosis measurements (when applicable) are obtained utilizing NASCET criteria, using the distal internal carotid diameter as the denominator. CONTRAST:  16mL OMNIPAQUE IOHEXOL 350 MG/ML SOLN COMPARISON:  11/21/2015 FINDINGS: CT HEAD FINDINGS Brain: There is no mass, hemorrhage or extra-axial collection. The size and configuration of the ventricles and extra-axial CSF spaces are normal. There is hypoattenuation of the periventricular white matter, most commonly indicating chronic ischemic microangiopathy. Skull: The visualized skull base, calvarium and extracranial soft tissues are normal. Sinuses/Orbits: No fluid levels or advanced mucosal thickening of the visualized paranasal sinuses. No mastoid or middle ear effusion. The orbits are normal. CTA NECK  FINDINGS SKELETON: Multilevel degenerative disc disease and facet hypertrophy without bony spinal canal stenosis. OTHER NECK: Normal pharynx, larynx and major salivary glands. No cervical lymphadenopathy. Unremarkable thyroid gland. UPPER CHEST: Biapical emphysema. AORTIC ARCH: There a large amount of soft and calcific atherosclerosis of the aortic arch. There is no aneurysm, dissection or hemodynamically significant stenosis of the visualized portion of the aorta. Conventional 3 vessel aortic branching pattern. The visualized proximal subclavian arteries are widely patent. RIGHT CAROTID SYSTEM: No dissection, occlusion or aneurysm. There is calcific atherosclerosis extending into the proximal ICA, resulting in 40% stenosis. LEFT CAROTID SYSTEM: 1.6 cm dilatation of the carotid bulb is unchanged. No stenosis. Status post endarterectomy. VERTEBRAL ARTERIES: Right dominant configuration. Both origins are clearly patent. There is no dissection, occlusion or flow-limiting stenosis to the skull base (V1-V3 segments). CTA HEAD FINDINGS POSTERIOR CIRCULATION: --Vertebral arteries: Atherosclerotic calcification on the right without stenosis. --Inferior cerebellar arteries: Normal. --Basilar artery: Normal. --Superior cerebellar arteries: Normal. --Posterior cerebral arteries (PCA): Normal. ANTERIOR CIRCULATION: --Intracranial internal carotid arteries: Aneurysm of the left ophthalmic/clinoid segment measures 1.7 x 1.4 cm, previously 1.0 x 1.0 cm. There is fusiform aneurysmal dilatation of the proximal cavernous segment measuring 7 mm. --Anterior cerebral arteries (ACA): Normal. Absent right A1 segment, normal variant --Middle cerebral arteries (MCA): Normal. VENOUS SINUSES: As permitted by contrast timing, patent. ANATOMIC VARIANTS: None Review of the MIP images confirms the  above findings. IMPRESSION: 1. No emergent large vessel occlusion or high-grade stenosis of the intracranial arteries. 2. Increased size of left ICA  ophthalmic/clinoid segment aneurysm, now measuring 1.7 x 1.4 cm, previously 1.0 x 1.0 cm. 3. Unchanged fusiform dilatation of the proximal cavernous segment measuring 7 mm. 4. Status post left carotid endarterectomy without stenosis. 40% stenosis of the proximal right internal carotid artery by NASCET criteria. Aortic Atherosclerosis (ICD10-I70.0) and Emphysema (ICD10-J43.9). Electronically Signed   By: Ulyses Jarred M.D.   On: 12/23/2020 21:35   MR BRAIN WO CONTRAST  Result Date: 12/25/2020 CLINICAL DATA:  Right-sided weakness EXAM: MRI HEAD WITHOUT CONTRAST TECHNIQUE: Multiplanar, multiecho pulse sequences of the brain and surrounding structures were obtained without intravenous contrast. COMPARISON:  11/28/2020 FINDINGS: Brain: There are punctate foci of abnormal diffusion restriction in both frontal lobes and in the left occipital lobe. Minimal chronic microhemorrhage. There is multifocal hyperintense T2-weighted signal within the white matter. Generalized volume loss without a clear lobar predilection. The midline structures are normal. Vascular: Left ICA aneurysm again demonstrated. Skull and upper cervical spine: Normal calvarium and skull base. Narrowing of the cervical spinal at the C2 level. Sinuses/Orbits:No paranasal sinus fluid levels or advanced mucosal thickening. Right mastoid effusion. Normal orbits. IMPRESSION: 1. Punctate foci of acute ischemia in both frontal lobes and left occipital lobe. 2. No acute hemorrhage or mass effect. 3. Chronic ischemic microangiopathy and generalized volume loss. 4. Left ICA aneurysm, unchanged Electronically Signed   By: Ulyses Jarred M.D.   On: 12/25/2020 03:01    Scheduled Meds:  ALPRAZolam  0.5 mg Oral BID   amLODipine  5 mg Oral Daily   aspirin  325 mg Oral Daily   atorvastatin  80 mg Oral Daily   Chlorhexidine Gluconate Cloth  6 each Topical Daily   enoxaparin (LOVENOX) injection  30 mg Subcutaneous Daily   furosemide  20 mg Oral Daily    hydrALAZINE  50 mg Oral TID   insulin aspart  0-5 Units Subcutaneous QHS   insulin aspart  0-9 Units Subcutaneous TID WC   isosorbide mononitrate  30 mg Oral Daily   latanoprost  1 drop Both Eyes QHS   levothyroxine  75 mcg Oral QAC breakfast   pantoprazole  40 mg Oral Daily   potassium chloride SA  20 mEq Oral Daily   predniSONE  5 mg Oral Q breakfast   senna-docusate  2 tablet Oral QHS   Continuous Infusions:   LOS: 0 days    Time spent: 35 mins    Zymier Rodgers, MD [facility]   If 7PM-7AM, please contact night-coverage

## 2020-12-25 NOTE — Evaluation (Addendum)
Physical Therapy Evaluation Patient Details Name: [name] MRN: [id number] DOB: 07/07/1925 Today's Date: 12/25/2020   History of Present Illness  87 y.o. female presented to ED on 12/23/2020 due to right-sided weakness of 2 days duration. 12/25/20 MRI acute ischemia in both frontal lobes and left  occipital lobe.  PMH significant for prior CVA in 2015, former tobacco user, quit 4 years ago, COPD, CKD 3B, chronic anxiety, hypertension, hyperlipidemia, hypothyroidism, GERD,  Clinical Impression   Pt admitted secondary to problem above with deficits below. PTA patient was walking modified independently household distances with RW. She had an aide to assist with showering and children provide groceries and meals.  Pt currently requires minguard assist with walking with RW and was limited by dizziness.  Anticipate patient will benefit from PT to address problems listed below.Will continue to follow acutely to maximize functional mobility independence and safety.       Follow Up Recommendations Home health PT; 24/7 Supervision for mobility; if family cannot provide 24/7, may need short-term SNF    Equipment Recommendations  None recommended by PT    Recommendations for Other Services OT consult     Precautions / Restrictions Precautions Precautions: Fall      Mobility  Bed Mobility Overal bed mobility: Needs Assistance Bed Mobility: Supine to Sit;Sit to Supine     Supine to sit: Supervision (with rail) Sit to supine: Min guard (near need for assist with legs)        Transfers Overall transfer level: Needs assistance Equipment used: Rolling walker (2 wheeled) Transfers: Sit to/from Stand Sit to Stand: Min assist         General transfer comment: cues for hand placement with each of 7 transfers (pt prefers to have both hands on RW for sit<>stand). Pt can verbalize how she "is supposed to do it"  Ambulation/Gait Ambulation/Gait assistance: Min guard Gait Distance  (Feet): 24 Feet Assistive device: Rolling walker (2 wheeled) Gait Pattern/deviations: Step-through pattern;Decreased stride length;Shuffle;Trunk flexed        Stairs            Wheelchair Mobility    Modified Rankin (Stroke Patients Only)       Balance Overall balance assessment: Mild deficits observed, not formally tested                                           Pertinent Vitals/Pain Pain Assessment: Faces Faces Pain Scale: Hurts little more Pain Location: left hip/sciatica Pain Descriptors / Indicators: Aching Pain Intervention(s): Limited activity within patient's tolerance;Monitored during session    Home Living Family/patient expects to be discharged to:: Private residence Living Arrangements: Alone Available Help at Discharge: Family;Available 24 hours/day;Personal care attendant Type of Home: House Home Access: Ramped entrance     Home Layout: Multi-level Home Equipment: Shower seat;Walker - 2 wheels;Walker - 4 wheels;Transport chair;Cane - single point;Grab bars - tub/shower;Other (comment) (2 chair lifts and RW on every level) Additional Comments: Aide comes 1x/week for ~30 minutes to assist with shower    Prior Function Level of Independence: Needs assistance   Gait / Transfers Assistance Needed: Very short distance ambulation with RW. Uses transport chair when out of home.  ADL's / Homemaking Assistance Needed: Family does meal prep. Aide assists with bathing 1x/wk; pt dresses herself  Comments: has housekeeper every two weeks to help clean, cooks, drives, gets groceries  Hand Dominance   Dominant Hand: Right    Extremity/Trunk Assessment   Upper Extremity Assessment Upper Extremity Assessment: Defer to OT evaluation    Lower Extremity Assessment Lower Extremity Assessment: Generalized weakness    Cervical / Trunk Assessment Cervical / Trunk Assessment: Kyphotic  Communication   Communication: HOH  Cognition  Arousal/Alertness: Awake/alert Behavior During Therapy: WFL for tasks assessed/performed Overall Cognitive Status: Within Functional Limits for tasks assessed                                 General Comments: cognition not specifically tested      General Comments General comments (skin integrity, edema, etc.): Pt became dizzy upon sitting and initial sit to stands. Ultimately progressed to walking in room. Pt surprised by how weak she is after one day in bed and performed repeated sit to stands    Exercises Other Exercises Other Exercises: sit to stand x 5 consecutive reps from recliner   Assessment/Plan    PT Assessment Patient needs continued PT services  PT Problem List Decreased strength;Decreased activity tolerance;Decreased balance;Decreased mobility;Decreased knowledge of use of DME       PT Treatment Interventions DME instruction;Gait training;Functional mobility training;Therapeutic activities;Therapeutic exercise;Balance training;Patient/family education    PT Goals (Current goals can be found in the Care Plan section)  Acute Rehab PT Goals Patient Stated Goal: return home and resume HHPT with same company and therapist PT Goal Formulation: With patient Time For Goal Achievement: 01/08/21 Potential to Achieve Goals: Good    Frequency Min 3X/week   Barriers to discharge        Co-evaluation               AM-PAC PT "6 Clicks" Mobility  Outcome Measure Help needed turning from your back to your side while in a flat bed without using bedrails?: A Little Help needed moving from lying on your back to sitting on the side of a flat bed without using bedrails?: A Little Help needed moving to and from a bed to a chair (including a wheelchair)?: A Little Help needed standing up from a chair using your arms (e.g., wheelchair or bedside chair)?: A Little Help needed to walk in hospital room?: A Little Help needed climbing 3-5 steps with a railing? : A  Lot 6 Click Score: 17    End of Session Equipment Utilized During Treatment: Gait belt Activity Tolerance: Treatment limited secondary to medical complications (Comment);Patient limited by fatigue (dizziness) Patient left: in bed;with call bell/phone within reach;Other (comment) (with tech for 2D echo (had to return to bed)) Nurse Communication: Mobility status;Other (comment) (pt would like to get OOB again after 2D echo) PT Visit Diagnosis: Unsteadiness on feet (R26.81);Muscle weakness (generalized) (M62.81)    Time: 3419-3790 PT Time Calculation (min) (ACUTE ONLY): 29 min   Charges:   PT Evaluation $PT Eval Moderate Complexity: 1 Mod PT Treatments $Therapeutic Exercise: 8-22 mins         Arby Barrette, PT Pager [phone]   Rexanne Mano 12/25/2020, 11:25 AM

## 2020-12-25 NOTE — Progress Notes (Signed)
SLP Cancellation Note  Patient Details Name: [name] MRN: [id number] DOB: Aug 05, 1924   Cancelled treatment:       Reason Eval/Treat Not Completed: Other (comment). Pt passed the [facility] swallow screen and was appropriately started on a diet. Will defer swallow evaluation   Breasia Karges, Katherene Ponto 12/25/2020, 8:08 AM

## 2020-12-25 NOTE — Evaluation (Addendum)
Occupational Therapy Evaluation Patient Details Name: [name] MRN: [id number] DOB: 1924/07/31 Today's Date: 12/25/2020    History of Present Illness 87 y.o. female presented to ED on 12/23/2020 due to right-sided weakness of 2 days duration. 12/25/20 MRI acute ischemia in both frontal lobes and left  occipital lobe.  PMH significant for prior CVA in 2015, former tobacco user, quit 4 years ago, COPD, CKD 3B, chronic anxiety, hypertension, hyperlipidemia, hypothyroidism, GERD,   Clinical Impression    This 87 yo female admitted with above presents to acute OT with mild balance deficits from weak right side, as well as mild weakness in RUE (dominant) all affecting her safety and independence with basic ADLs as she was doing them pta. She will benefit from acute OT with follow up [place] (only if she has 24/7 S/prn A) otherwise will need SNF. We will continue to follow.  Follow Up Recommendations  Home health OT;Supervision/Assistance - 24 hour (if not 24/7 then SNF is recommended)    Equipment Recommendations  None recommended by OT       Precautions / Restrictions Precautions Precautions: Fall Restrictions Weight Bearing Restrictions: No      Mobility Bed Mobility Overal bed mobility: Needs Assistance Bed Mobility: Supine to Sit     Supine to sit: Supervision          Transfers Overall transfer level: Needs assistance Equipment used: Rolling walker (2 wheeled) Transfers: Sit to/from Stand Sit to Stand: Min guard         General transfer comment: cues for hand placement    Balance Overall balance assessment: Mild deficits observed, not formally tested                                         ADL either performed or assessed with clinical judgement   ADL Overall ADL's : Needs assistance/impaired Eating/Feeding: Modified independent;Sitting   Grooming: Wash/dry hands;Min guard;Standing Grooming Details (indicate cue type and reason): Pt felt she  needed to support herself with hands between wetting them, washing them, and drying them Upper Body Bathing: Set up;Supervision/ safety;Sitting   Lower Body Bathing: Min guard;Sit to/from stand   Upper Body Dressing : Min guard;Set up;Sitting   Lower Body Dressing: Min guard;Sit to/from stand   Toilet Transfer: Min guard;Ambulation;RW Toilet Transfer Details (indicate cue type and reason): simulated bed>sink to groom>chiair>recliner Toileting- Clothing Manipulation and Hygiene: Min guard;Sit to/from stand      Time spent with both daughters discussing discharge options/plans.         Vision Baseline Vision/History: No visual deficits Patient Visual Report: No change from baseline Vision Assessment?: Yes Eye Alignment: Within Functional Limits Ocular Range of Motion: Within Functional Limits Alignment/Gaze Preference: Within Defined Limits Tracking/Visual Pursuits: Able to track stimulus in all quads without difficulty Convergence: Within functional limits Visual Fields: No apparent deficits            Pertinent Vitals/Pain Pain Assessment: Faces Faces Pain Scale: Hurts little more Pain Location: left hip/sciatica Pain Descriptors / Indicators: Aching;Sore Pain Intervention(s): Limited activity within patient's tolerance;Monitored during session;Repositioned     Hand Dominance Right   Extremity/Trunk Assessment Upper Extremity Assessment Upper Extremity Assessment: RUE deficits/detail RUE Deficits / Details: mild delay compared to LUE with all movements (she is right hand dominant), 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 RUE Coordination: decreased fine motor;decreased gross motor  Communication Communication Communication: HOH   Cognition Arousal/Alertness: Awake/alert Behavior During Therapy: WFL for tasks assessed/performed Overall Cognitive Status: Within Functional Limits for tasks assessed                                                 Home Living Family/patient expects to be discharged to:: Private residence Living Arrangements: Alone Available Help at Discharge: Family;Personal care attendant;Available PRN/intermittently Type of Home: House Home Access: Ramped entrance     Home Layout: Multi-level Alternate Level Stairs-Number of Steps: has chair lift to all 3 levels (split level home)   Bathroom Shower/Tub: Occupational psychologist: Handicapped height     Home Equipment: Clinical cytogeneticist - 2 wheels;Walker - 4 wheels;Transport chair;Cane - single point;Grab bars - tub/shower;Other (comment) (stair lifts and RWs on every level)   Additional Comments: Aide comes 1x/week for ~30 minutes to assist with shower; family/aid does cooking/cleaning      Prior Functioning/Environment Level of Independence: Needs assistance  Gait / Transfers Assistance Needed: Very short distance ambulation with RW. Uses transport chair when out of home. ADL's / Homemaking Assistance Needed: Family does meal prep. Aide assists with bathing 1x/wk; pt dresses herself   Comments: has housekeeper every two weeks to help clean, cooks, drives, gets groceries        OT Problem List: Decreased strength;Decreased range of motion;Impaired balance (sitting and/or standing);Impaired UE functional use;Pain      OT Treatment/Interventions: Self-care/ADL training;DME and/or AE instruction;Patient/family education;Balance training;Therapeutic exercise;Therapeutic activities    OT Goals(Current goals can be found in the care plan section) Acute Rehab OT Goals Patient Stated Goal: really wants to go home OT Goal Formulation: With patient Time For Goal Achievement: 01/08/21 Potential to Achieve Goals: Good  OT Frequency: Min 2X/week              AM-PAC OT "6 Clicks" Daily Activity     Outcome Measure Help from another person eating meals?: None Help from another person taking care of personal grooming?: A Little Help from  another person toileting, which includes using toliet, bedpan, or urinal?: A Little Help from another person bathing (including washing, rinsing, drying)?: A Little Help from another person to put on and taking off regular upper body clothing?: A Little Help from another person to put on and taking off regular lower body clothing?: A Little 6 Click Score: 19   End of Session Equipment Utilized During Treatment: Rolling walker  Activity Tolerance: Patient tolerated treatment well Patient left: in chair;with call bell/phone within reach;with chair alarm set  OT Visit Diagnosis: Unsteadiness on feet (R26.81);Muscle weakness (generalized) (M62.81);Pain Pain - Right/Left: Left Pain - part of body: Hip                Time: 1404-1500 OT Time Calculation (min): 56 min Charges:  OT General Charges $OT Visit: 1 Visit OT Evaluation $OT Eval Moderate Complexity: 1 Mod OT Treatments $Self Care/Home Management : 38-52 mins [place], OTR/L Acute [employer] Pager [phone] Office [phone]    Almon Register 12/25/2020, 3:22 PM

## 2020-12-26 LAB — GLUCOSE, CAPILLARY
Glucose-Capillary: 85 mg/dL (ref 70–99)
Glucose-Capillary: 95 mg/dL (ref 70–99)
Glucose-Capillary: 120 mg/dL — ABNORMAL HIGH (ref 70–99)

## 2020-12-26 MED ORDER — ASPIRIN EC 81 MG PO TBEC
81.0000 mg | DELAYED_RELEASE_TABLET | Freq: Every day | ORAL | Status: DC
  Administered 2020-12-27: 81 mg via ORAL
  Filled 2020-12-26: qty 1

## 2020-12-26 MED ORDER — CLOPIDOGREL BISULFATE 75 MG PO TABS
75.0000 mg | ORAL_TABLET | Freq: Every day | ORAL | Status: DC
  Administered 2020-12-26 – 2020-12-27 (×2): 75 mg via ORAL
  Filled 2020-12-26 (×2): qty 1

## 2020-12-26 NOTE — Progress Notes (Addendum)
PROGRESS NOTE    RACHAEL FERRIE  NTI:[id number] DOB: 10-31-1924 DOA: 12/23/2020 PCP: [name], NP   Brief Narrative:  This 87 years old female with PMH significant for prior CVA in 2015, former tobacco user, quit 4 years ago, COPD, CKD stage IIIb, chronic anxiety, hypertension, hyperlipidemia, hypothyroidism, GERD presented in the ED with right-sided weakness for 2 days.  Patient reports dropping things out of her right hand and dragging her right lower extremity.  She uses walker at baseline. Patient is admitted for stroke work-up.  She is out of tPA window.  CT head revealed no acute changes.  CTA head and neck revealed increased size of left ICA ophthalmic/glenoid segment aneurysm from 1 x1 cm to 1.7 x 1.4 cm. MRI Brain: Punctate foci of acute ischemia in both frontal lobes and left occipital lobe.No acute hemorrhage or mass effect.  Assessment & Plan:   Active Problems:   Right hand weakness   Cerebral thrombosis with cerebral infarction  Right hand/right-sided weakness : She presented with right hand weakness, dropping things associated with mild right lower extremity weakness.   Last known well 12/21/2020. Outside of tPA window. CT head revealed no acute changes, CTA head and neck revealed increased size of left ICA ophthalmic segment aneurysm. MRI : Punctate foci of acute ischemia in both frontal lobes and left occipital lobe. 2D echo: LVEF: 40 to 45%.  Mild left ventricular dysfunction, no regional wall motion abnormalities.  Grade 1 diastolic dysfunction QQPYP/P5K : LDL 74 PT and OT recommended HHPT with 24-hour supervision / SNF Frequent neurochecks Continue Aspirin, plavix  high intensity statin. No need for permissive hypertension per neurology. Neurology states patient is not a candidate for aggressive intervention with a stent coiling given her age. Neurology recommended aspirin and Plavix for 3 weeks followed by aspirin only.  Left ICA ophthalmic/clinoid segment  aneurysm from 1 x 1 cm to 1.7 x 1.4 cm. MRI reported unchanged aneurysm.   Essential hypertension BP stable. Continue home medications.  Type 2 diabetes: Controlled. Hb A1C 6.1 Goal hemoglobin A1c less than 7.0. Continue Insulin sliding scale   Hyperlipidemia Goal LDL less than 70.  Continue statins.   Combined chronic diastolic and systolic CHF Last 2D echo from 08/31/2020 revealed LVEF 40 to 45% and grade 1 diastolic dysfunction. Euvolemic on exam. Resume home regimen. Strict I's and O's and daily weight   Hypothyroidism: Continue levothyroxine   COPD on chronic steroid use Currently stable Continue inhalers.   Chronic anxiety Continue Xanax   GERD/constipation Continue Protonix/Colace   Ambulatory dysfunction Uses a walker to ambulate PT and OT evaluation  Acute urinary retention: Patient required Foley catheter when admitted. Voiding trial in progress.  DVT prophylaxis:  Lovenox Code Status:  DNR Family Communication: Daughter @ bed side. Disposition Plan:   Status is: Inpatient  Remains inpatient appropriate because:Inpatient level of care appropriate due to severity of illness  Dispo:  Patient From: Home  Planned Disposition: [facility] /HHPT  Medically stable for discharge: No    Consultants:  Neurology  Procedures: CTA head and neck, MRI  Antimicrobials:   Anti-infectives (From admission, onward)    None        Subjective: Patient was seen and examined at bedside.  Overnight events noted.   Patient was sitting comfortably on the bed, reports feeling improved.   She still reports feeling weak on the right side. Foley catheter removed, voiding trial started.  Objective: Vitals:   12/25/20 1958 12/26/20 9326 12/26/20 7124  12/26/20 1132  BP: 140/67 (!) 167/59 (!) 154/61 115/60  Pulse: (!) 58 64 62 68  Resp: 18 18 16 16   Temp: 98.2 F (36.8 C) 97.8 F (36.6 C) 98.2 F (36.8 C) 97.6 F (36.4 C)  TempSrc: Oral Oral  Oral Oral  SpO2: 96% 98% 99% 96%  Weight:      Height:        Intake/Output Summary (Last 24 hours) at 12/26/2020 1629 Last data filed at 12/26/2020 1400 Gross per 24 hour  Intake --  Output 1450 ml  Net -1450 ml   Filed Weights   12/23/20 1947  Weight: 58.8 kg    Examination:  General exam: Appears calm and comfortable , not in any acute distress.  Appears cheerful. Respiratory system: Clear to auscultation. Respiratory effort normal. Cardiovascular system: S1 & S2 heard, RRR. No JVD, murmurs, rubs, gallops or clicks. No pedal edema. Gastrointestinal system: Abdomen is nondistended, soft and nontender. No organomegaly or masses felt. Normal bowel sounds heard. Central nervous system: Alert and oriented.  Right upper and lower extremity weakness. Extremities: Right upper and lower extremity weakness.  No edema, no cyanosis, no clubbing. Skin: No rashes, lesions or ulcers Psychiatry: Judgement and insight appear normal. Mood & affect appropriate.     Data Reviewed: I have personally reviewed following labs and imaging studies  CBC: Recent Labs  Lab 12/23/20 1942 12/25/20 0432  WBC 6.1 5.5  NEUTROABS 4.7  --   HGB 12.6 11.2*  HCT 39.0 34.8*  MCV 94.7 95.9  PLT 147* 496*   Basic Metabolic Panel: Recent Labs  Lab 12/23/20 1942 12/25/20 0432  NA 139 139  K 3.8 3.6  CL 102 104  CO2 26 28  GLUCOSE 168* 91  BUN 22 19  CREATININE 1.27* 1.17*  CALCIUM 9.0 8.6*  MG  --  2.1  PHOS  --  3.6   GFR: Estimated Creatinine Clearance: 27.9 mL/min (A) (by C-G formula based on SCr of 1.17 mg/dL (H)). Liver Function Tests: Recent Labs  Lab 12/23/20 1942  AST 19  ALT 15  ALKPHOS 93  BILITOT 0.6  PROT 6.4*  ALBUMIN 3.9   No results for input(s): LIPASE, AMYLASE in the last 168 hours. No results for input(s): AMMONIA in the last 168 hours. Coagulation Profile: Recent Labs  Lab 12/23/20 1942  INR 1.0   Cardiac Enzymes: No results for input(s): CKTOTAL, CKMB,  CKMBINDEX, TROPONINI in the last 168 hours. BNP (last 3 results) No results for input(s): PROBNP in the last 8760 hours. HbA1C: Recent Labs    12/25/20 0432  HGBA1C 5.8*   CBG: Recent Labs  Lab 12/25/20 1713 12/25/20 2001 12/26/20 0727 12/26/20 1135 12/26/20 1624  GLUCAP 119* 129* 85 95 120*   Lipid Profile: Recent Labs    12/25/20 0432  CHOL 159  HDL 74  LDLCALC 74  TRIG 56  CHOLHDL 2.1   Thyroid Function Tests: No results for input(s): TSH, T4TOTAL, FREET4, T3FREE, THYROIDAB in the last 72 hours. Anemia Panel: No results for input(s): VITAMINB12, FOLATE, FERRITIN, TIBC, IRON, RETICCTPCT in the last 72 hours. Sepsis Labs: No results for input(s): PROCALCITON, LATICACIDVEN in the last 168 hours.  Recent Results (from the past 240 hour(s))  Resp Panel by RT-PCR (Flu A&B, Covid) Nasopharyngeal Swab     Status: None   Collection Time: 12/23/20  7:42 PM   Specimen: Nasopharyngeal Swab; Nasopharyngeal(NP) swabs in vial transport medium  Result Value Ref Range Status   SARS Coronavirus 2 by  RT PCR NEGATIVE NEGATIVE Final    Comment: (NOTE) SARS-CoV-2 target nucleic acids are NOT DETECTED.  The SARS-CoV-2 RNA is generally detectable in upper respiratory specimens during the acute phase of infection. The lowest concentration of SARS-CoV-2 viral copies this assay can detect is 138 copies/mL. A negative result does not preclude SARS-Cov-2 infection and should not be used as the sole basis for treatment or other patient management decisions. A negative result may occur with  improper specimen collection/handling, submission of specimen other than nasopharyngeal swab, presence of viral mutation(s) within the areas targeted by this assay, and inadequate number of viral copies(<138 copies/mL). A negative result must be combined with clinical observations, patient history, and epidemiological information. The expected result is Negative.  Fact Sheet for Patients:   EntrepreneurPulse.com.au  Fact Sheet for Healthcare Providers:  IncredibleEmployment.be  This test is no t yet approved or cleared by the [place] FDA and  has been authorized for detection and/or diagnosis of SARS-CoV-2 by FDA under an Emergency Use Authorization (EUA). This EUA will remain  in effect (meaning this test can be used) for the duration of the COVID-19 declaration under Section 564(b)(1) of the Act, 21 U.S.C.section 360bbb-3(b)(1), unless the authorization is terminated  or revoked sooner.       Influenza A by PCR NEGATIVE NEGATIVE Final   Influenza B by PCR NEGATIVE NEGATIVE Final    Comment: (NOTE) The Xpert Xpress SARS-CoV-2/FLU/RSV plus assay is intended as an aid in the diagnosis of influenza from Nasopharyngeal swab specimens and should not be used as a sole basis for treatment. Nasal washings and aspirates are unacceptable for Xpert Xpress SARS-CoV-2/FLU/RSV testing.  Fact Sheet for Patients: EntrepreneurPulse.com.au  Fact Sheet for Healthcare Providers: IncredibleEmployment.be  This test is not yet approved or cleared by the [place] FDA and has been authorized for detection and/or diagnosis of SARS-CoV-2 by FDA under an Emergency Use Authorization (EUA). This EUA will remain in effect (meaning this test can be used) for the duration of the COVID-19 declaration under Section 564(b)(1) of the Act, 21 U.S.C. section 360bbb-3(b)(1), unless the authorization is terminated or revoked.  Performed at [facility], [address], [place], [place] [address]     Radiology Studies: MR BRAIN WO CONTRAST  Result Date: 12/25/2020 CLINICAL DATA:  Right-sided weakness EXAM: MRI HEAD WITHOUT CONTRAST TECHNIQUE: Multiplanar, multiecho pulse sequences of the brain and surrounding structures were obtained without intravenous contrast. COMPARISON:  11/28/2020 FINDINGS:  Brain: There are punctate foci of abnormal diffusion restriction in both frontal lobes and in the left occipital lobe. Minimal chronic microhemorrhage. There is multifocal hyperintense T2-weighted signal within the white matter. Generalized volume loss without a clear lobar predilection. The midline structures are normal. Vascular: Left ICA aneurysm again demonstrated. Skull and upper cervical spine: Normal calvarium and skull base. Narrowing of the cervical spinal at the C2 level. Sinuses/Orbits:No paranasal sinus fluid levels or advanced mucosal thickening. Right mastoid effusion. Normal orbits. IMPRESSION: 1. Punctate foci of acute ischemia in both frontal lobes and left occipital lobe. 2. No acute hemorrhage or mass effect. 3. Chronic ischemic microangiopathy and generalized volume loss. 4. Left ICA aneurysm, unchanged Electronically Signed   By: Ulyses Jarred M.D.   On: 12/25/2020 03:01   ECHOCARDIOGRAM COMPLETE  Result Date: 12/25/2020    ECHOCARDIOGRAM REPORT   Patient Name:   KINZLEIGH KANDLER Date of Exam: 12/25/2020 Medical Rec #:  [id number]     Height:       66.0 in Accession #:  [id number]    Weight:       129.6 lb Date of Birth:  1924-10-25    BSA:          1.663 m Patient Age:    89 years      BP:           156/69 mmHg Patient Gender: F             HR:           62 bpm. Exam Location:  Inpatient Procedure: 2D Echo, Cardiac Doppler and Color Doppler Indications:    Acute right side body wewakness  History:        Patient has prior history of Echocardiogram examinations, most                 recent 08/31/2020. COPD and Stroke; Risk Factors:Dyslipidemia,                 Hypertension, Former Smoker and Diabetes. 06/24/2014 TEE with                 negative bubble study.  Sonographer:    Luisa Hart RDCS Referring Phys: Kiefer  1. Left ventricular ejection fraction, by estimation, is 40 to 45%. Left ventricular ejection fraction by 2D MOD biplane is 42.9 %. The left ventricle has  mildly decreased function. The left ventricle has no regional wall motion abnormalities. There is mild left ventricular hypertrophy. Left ventricular diastolic parameters are consistent with Grade I diastolic dysfunction (impaired relaxation). Elevated left ventricular end-diastolic pressure.  2. Right ventricular systolic function is hyperdynamic. The right ventricular size is normal. There is normal pulmonary artery systolic pressure. The estimated right ventricular systolic pressure is 97.9 mmHg.  3. Left atrial size was mildly dilated.  4. The pericardial effusion is posterior to the left ventricle.  5. The mitral valve is abnormal. Trivial mitral valve regurgitation.  6. The aortic valve is tricuspid. Aortic valve regurgitation is not visualized. Moderate aortic valve stenosis. Aortic valve area, by VTI measures 0.98 cm. Aortic valve mean gradient measures 15.7 mmHg. Aortic valve Vmax measures 2.72 m/s. DI is 0.39.  7. The inferior vena cava is normal in size with greater than 50% respiratory variability, suggesting right atrial pressure of 3 mmHg. Comparison(s): No significant change from prior study. 08/31/2020: LVEF 40-45%, moderate AS - mean gradient 19.5 mmHg. FINDINGS  Left Ventricle: Left ventricular ejection fraction, by estimation, is 40 to 45%. Left ventricular ejection fraction by 2D MOD biplane is 42.9 %. The left ventricle has mildly decreased function. The left ventricle has no regional wall motion abnormalities. The left ventricular internal cavity size was normal in size. There is mild left ventricular hypertrophy. Left ventricular diastolic parameters are consistent with Grade I diastolic dysfunction (impaired relaxation). Elevated left ventricular end-diastolic pressure. Right Ventricle: The right ventricular size is normal. No increase in right ventricular wall thickness. Right ventricular systolic function is hyperdynamic. There is normal pulmonary artery systolic pressure. The tricuspid  regurgitant velocity is 1.82 m/s, and with an assumed right atrial pressure of 3 mmHg, the estimated right ventricular systolic pressure is 89.2 mmHg. Left Atrium: Left atrial size was mildly dilated. Right Atrium: Right atrial size was normal in size. Pericardium: Trivial pericardial effusion is present. The pericardial effusion is posterior to the left ventricle. Mitral Valve: The mitral valve is abnormal. There is mild thickening of the mitral valve leaflet(s). There is mild calcification of the mitral valve leaflet(s). Mild mitral annular calcification.  Trivial mitral valve regurgitation. MV peak gradient, 6.0 mmHg. The mean mitral valve gradient is 2.0 mmHg. Tricuspid Valve: The tricuspid valve is grossly normal. Tricuspid valve regurgitation is trivial. Aortic Valve: The aortic valve is tricuspid. Aortic valve regurgitation is not visualized. Moderate aortic stenosis is present. Aortic valve mean gradient measures 15.7 mmHg. Aortic valve peak gradient measures 29.6 mmHg. Aortic valve area, by VTI measures 0.98 cm. Pulmonic Valve: The pulmonic valve was normal in structure. Pulmonic valve regurgitation is not visualized. Aorta: The aortic root and ascending aorta are structurally normal, with no evidence of dilitation. Venous: The inferior vena cava is normal in size with greater than 50% respiratory variability, suggesting right atrial pressure of 3 mmHg. IAS/Shunts: No atrial level shunt detected by color flow Doppler.  LEFT VENTRICLE PLAX 2D                        Biplane EF (MOD) LVIDd:         4.40 cm         LV Biplane EF:   Left LVIDs:         3.30 cm                          ventricular LV PW:         1.30 cm                          ejection LV IVS:        1.30 cm                          fraction by LVOT diam:     1.80 cm                          2D MOD LV SV:         57                               biplane is LV SV Index:   34                               42.9 %. LVOT Area:     2.54 cm                                 Diastology                                LV e' medial:    2.85 cm/s LV Volumes (MOD)               LV E/e' medial:  21.4 LV vol d, MOD    38.9 ml       LV e' lateral:   3.45 cm/s A2C:                           LV E/e' lateral: 17.7 LV vol d, MOD    61.6 ml A4C: LV vol s, MOD    25.8 ml A2C: LV vol s, MOD    30.4  ml A4C: LV SV MOD A2C:   13.1 ml LV SV MOD A4C:   61.6 ml LV SV MOD BP:    21.1 ml RIGHT VENTRICLE RV Basal diam:  2.70 cm RV Mid diam:    1.70 cm RV S prime:     17.00 cm/s TAPSE (M-mode): 2.4 cm LEFT ATRIUM             Index       RIGHT ATRIUM           Index LA diam:        2.90 cm 1.74 cm/m  RA Area:     13.30 cm LA Vol (A2C):   84.2 ml 50.63 ml/m RA Volume:   27.30 ml  16.42 ml/m LA Vol (A4C):   34.5 ml 20.74 ml/m LA Biplane Vol: 58.4 ml 35.12 ml/m  AORTIC VALVE                    PULMONIC VALVE AV Area (Vmax):    0.93 cm     PV Vmax:       1.19 m/s AV Area (Vmean):   0.88 cm     PV Vmean:      65.200 cm/s AV Area (VTI):     0.98 cm     PV VTI:        0.209 m AV Vmax:           272.00 cm/s  PV Peak grad:  5.6 mmHg AV Vmean:          181.000 cm/s PV Mean grad:  2.0 mmHg AV VTI:            0.580 m AV Peak Grad:      29.6 mmHg AV Mean Grad:      15.7 mmHg LVOT Vmax:         99.55 cm/s LVOT Vmean:        62.900 cm/s LVOT VTI:          0.224 m LVOT/AV VTI ratio: 0.39  AORTA Ao Root diam: 3.00 cm Ao Asc diam:  2.90 cm MITRAL VALVE                TRICUSPID VALVE MV Area (PHT): 2.62 cm     TR Peak grad:   13.2 mmHg MV Area VTI:   1.56 cm     TR Vmax:        182.00 cm/s MV Peak grad:  6.0 mmHg MV Mean grad:  2.0 mmHg     SHUNTS MV Vmax:       1.22 m/s     Systemic VTI:  0.22 m MV Vmean:      62.0 cm/s    Systemic Diam: 1.80 cm MV Decel Time: 290 msec MV E velocity: 60.90 cm/s MV A velocity: 109.00 cm/s MV E/A ratio:  0.56 Lyman Bishop MD Electronically signed by Lyman Bishop MD Signature Date/Time: 12/25/2020/1:23:45 PM    Final     Scheduled Meds:  ALPRAZolam  0.5 mg Oral BID    amLODipine  5 mg Oral Daily   aspirin EC  81 mg Oral Daily   atorvastatin  80 mg Oral Daily   Chlorhexidine Gluconate Cloth  6 each Topical Daily   clopidogrel  75 mg Oral Daily   enoxaparin (LOVENOX) injection  30 mg Subcutaneous Daily   furosemide  20 mg Oral Daily   hydrALAZINE  50 mg Oral TID   insulin aspart  0-5 Units  Subcutaneous QHS   insulin aspart  0-9 Units Subcutaneous TID WC   isosorbide mononitrate  30 mg Oral Daily   latanoprost  1 drop Both Eyes QHS   levothyroxine  75 mcg Oral QAC breakfast   pantoprazole  40 mg Oral Daily   potassium chloride SA  20 mEq Oral Daily   predniSONE  5 mg Oral Q breakfast   senna-docusate  2 tablet Oral QHS   Continuous Infusions:   LOS: 1 day    Time spent: 25 mins    Shawna Clamp, MD [facility]   If 7PM-7AM, please contact night-coverage

## 2020-12-26 NOTE — Plan of Care (Signed)

## 2020-12-26 NOTE — Progress Notes (Signed)
Urinary catheter removed at 1000. Voiding trial begun.

## 2020-12-26 NOTE — Progress Notes (Addendum)
Occupational Therapy Treatment Patient Details Name: [name] MRN: [id number] DOB: 09/06/1924 Today's Date: 12/26/2020    History of present illness 87 y.o. female presented to ED on 12/23/2020 due to right-sided weakness of 2 days duration. 12/25/20 MRI acute ischemia in both frontal lobes and left  occipital lobe.  PMH significant for prior CVA in 2015, former tobacco user, quit 4 years ago, COPD, CKD 3B, chronic anxiety, hypertension, hyperlipidemia, hypothyroidism, GERD,   OT comments  Pt received in recliner with pts daughters present. Pt making steady progress towards OT goals. Session focus on RUE [facility] and BADL reeducation. Pt continues to present with impaired RUE FMC, imparied balance and decreased strength and endurance. Pt reports liking to play Bridge prior to hospitalization, issued pt playing cards with pt able to separate cards by suit. Pt with full AROM in RUE, mild strength deficits noted in R hand ( may benefit from theraputty HEP next session). Pt currently requires MIN guard assist for household distance functional mobility with RW, supervision for standing ADLs and [place]  for toileting hygiene. Pts daughters reports that pt will not have 24/7 support at home, therefore pt likely to DC to SNF as indicated below, will follow acutely per POC.    Follow Up Recommendations  Home health OT;Supervision/Assistance - 24 hour;Other (comment) (if not 24/7 then SNF is recommended)    Equipment Recommendations  None recommended by OT    Recommendations for Other Services      Precautions / Restrictions Precautions Precautions: Fall Restrictions Weight Bearing Restrictions: No       Mobility Bed Mobility               General bed mobility comments: pt received in recliner, and left up in recliner at end of session    Transfers Overall transfer level: Needs assistance Equipment used: Rolling walker (2 wheeled) Transfers: Sit to/from Stand Sit to Stand: Min guard          General transfer comment: minguard for safety, cues to use grab bars during sit<>stand    Balance Overall balance assessment: Needs assistance Sitting-balance support: No upper extremity supported;Feet supported Sitting balance-Leahy Scale: Fair     Standing balance support: No upper extremity supported;During functional activity Standing balance-Leahy Scale: Fair Standing balance comment: standing at sink for ADLs with no UE support                           ADL either performed or assessed with clinical judgement   ADL Overall ADL's : Needs assistance/impaired     Grooming: Wash/dry hands;Standing;Supervision/safety Grooming Details (indicate cue type and reason): gross supervision to stand at sink for hand hygiene, cues needed to locate soap                 Toilet Transfer: Min guard;Ambulation;RW;Regular Toilet;Grab bars Toilet Transfer Details (indicate cue type and reason): min guard for safety, cues for grab bars Toileting- Clothing Manipulation and Hygiene: Set up;Sitting/lateral lean;Sit to/from stand;Minimal assistance Toileting - Clothing Manipulation Details (indicate cue type and reason): MIN A to manage clothing during anterior/ posterior pericare     Functional mobility during ADLs: Min guard;Rolling walker General ADL Comments: pt continues to present with impaired RUE FMC, imparied balance and decreased strength and endurance     Vision Baseline Vision/History: Wears glasses Wears Glasses: Reading only     Perception     Praxis      Cognition Arousal/Alertness: Awake/alert Behavior During  Therapy: WFL for tasks assessed/performed Overall Cognitive Status: Within Functional Limits for tasks assessed                                          Exercises Hand Exercises Digit Composite Flexion: AROM;Right;10 reps;Seated Composite Extension: AROM;Right;10 reps;Seated Digit Composite Abduction: AROM;Right;10  reps;Seated Digit Composite Adduction: AROM;Right;10 reps;Seated Digit Lifts: AROM;Right;5 reps;Seated Opposition: AROM;Right;10 reps;Seated Hand Activities Cards - Deal: Right Cards - Flip: Right Cards - Hand Shuffle: Right Cards - Shuffle: Right Other Exercises Other Exercises: pt orgainizing playing cards by suit   Shoulder Instructions       General Comments pts daughters present during session, issued pt playing cards to practice [facility] skills, pt on RA during session with SpO2 96-97% HR in 80s with exertion.     Pertinent Vitals/ Pain       Pain Assessment: No/denies pain  Home Living                                          Prior Functioning/Environment              Frequency  Min 2X/week        Progress Toward Goals  OT Goals(current goals can now be found in the care plan section)  Progress towards OT goals: Progressing toward goals  Acute Rehab OT Goals Patient Stated Goal: to get some rehab OT Goal Formulation: With patient/family Time For Goal Achievement: 01/08/21 Potential to Achieve Goals: Good  Plan Discharge plan remains appropriate;Frequency remains appropriate    Co-evaluation                 AM-PAC OT "6 Clicks" Daily Activity     Outcome Measure   Help from another person eating meals?: None Help from another person taking care of personal grooming?: A Little Help from another person toileting, which includes using toliet, bedpan, or urinal?: A Little Help from another person bathing (including washing, rinsing, drying)?: A Little Help from another person to put on and taking off regular upper body clothing?: None Help from another person to put on and taking off regular lower body clothing?: A Little 6 Click Score: 20    End of Session Equipment Utilized During Treatment: Gait belt;Rolling walker  OT Visit Diagnosis: Unsteadiness on feet (R26.81);Muscle weakness (generalized) (M62.81)   Activity Tolerance  Patient tolerated treatment well   Patient Left in chair;with call bell/phone within reach;with family/visitor present   Nurse Communication Mobility status        Time: 1010-1039 OT Time Calculation (min): 29 min  Charges: OT General Charges $OT Visit: 1 Visit OT Treatments $Self Care/Home Management : 8-22 mins $Therapeutic Activity: 8-22 mins  Harley Alto., COTA/L Acute Rehabilitation Services [phone]    Precious Haws 12/26/2020, 12:20 PM

## 2020-12-26 NOTE — Progress Notes (Signed)
STROKE TEAM PROGRESS NOTE   INTERVAL HISTORY Patient remained stable.  Neurological exam is unchanged.  Vital signs are stable.  Therapist recommend home health Her 2 daughters at bedside.  They informed me that patient had a suspicion for A. fib but cardiologist Dr. Oval Linsey informed them that upon careful review she did not have definite A. fib.  Patient tells me categorically that she will not take Eliquis or any other strong blood thinner even if A. fib has been found in her future hence I am not see any point in doing loop recorder at this time We discussed her stroke diagnosis, ongoing work up and plan of care. Daughters were updated and questions answered Vitals:   12/25/20 1958 12/26/20 0521 12/26/20 0729 12/26/20 1132  BP: 140/67 (!) 167/59 (!) 154/61 115/60  Pulse: (!) 58 64 62 68  Resp: 18 18 16 16   Temp: 98.2 F (36.8 C) 97.8 F (36.6 C) 98.2 F (36.8 C) 97.6 F (36.4 C)  TempSrc: Oral Oral Oral Oral  SpO2: 96% 98% 99% 96%  Weight:      Height:       CBC:  Recent Labs  Lab 12/23/20 1942 12/25/20 0432  WBC 6.1 5.5  NEUTROABS 4.7  --   HGB 12.6 11.2*  HCT 39.0 34.8*  MCV 94.7 95.9  PLT 147* 474*   Basic Metabolic Panel:  Recent Labs  Lab 12/23/20 1942 12/25/20 0432  NA 139 139  K 3.8 3.6  CL 102 104  CO2 26 28  GLUCOSE 168* 91  BUN 22 19  CREATININE 1.27* 1.17*  CALCIUM 9.0 8.6*  MG  --  2.1  PHOS  --  3.6   Lipid Panel:  Recent Labs  Lab 12/25/20 0432  CHOL 159  TRIG 56  HDL 74  CHOLHDL 2.1  VLDL 11  LDLCALC 74   HgbA1c:  Recent Labs  Lab 12/25/20 0432  HGBA1C 5.8*   Urine Drug Screen:  Recent Labs  Lab 12/23/20 1942  LABOPIA NONE DETECTED  COCAINSCRNUR NONE DETECTED  LABBENZ POSITIVE*  AMPHETMU NONE DETECTED  THCU NONE DETECTED  LABBARB NONE DETECTED    Alcohol Level  Recent Labs  Lab 12/23/20 1942  ETH 13*    IMAGING past 24 hours No results found.  PHYSICAL EXAM General: Frail elderly malnourished looking Caucasian  lady not in distress awake alert in no distress HEENT: Normocephalic/atraumatic Lungs: Scattered wheezing CVS: Regular rhythm Abdomen nondistended nontender Extremities warm well perfused Neurological exam Awake alert oriented x3 Speech is mildly dysarthric- No aphasia Cranial nerves II to XII intact Motor examination with mild weakness of the right upper and right lower extremity.  Right lower extremity examination is more limited due to pain and hyperesthesia.  Left upper and lower extremity normal strength. Sensory exam: Difficult to perform given extreme hyperesthesia in the right leg. Coordination: No obvious dysmetria but slow to perform coordination testing right upper and right lower extremity compared to left upper left lower extremity. NIH stroke scale 1a Level of Conscious.: 0 1b LOC Questions: 0 1c LOC Commands: 0 2 Best Gaze: 0 3 Visual: 0 4 Facial Palsy: 0 5a Motor Arm - left: 0 5b Motor Arm - Right: 1 6a Motor Leg - Left: 0 6b Motor Leg - Right: 0 7 Limb Ataxia: 1 8 Sensory: 0 9 Best Language: 0 10 Dysarthria: 1 11 Extinct. and Inatten.: 0 TOTAL: 3  ASSESSMENT/PLAN [name] is a 87 y.o. female with history of chronic kidney  disease, COPD, hypertension, hyperlipidemia, stroke in 2015, history of tobacco abuse in the past, degenerative disc disease, atherosclerotic aortic arch, anxiety, presented to the emergency room for evaluation of right-sided weakness that started 2 days prior to presentation.   Punctate stroke n both frontal lobes and left occipital lobe likely due to atrial fibrillation not on anticoagulation  Code Stroke CT head No acute abnormality. Small vessel disease. Atrophy. ASPECTS 10.    CTA head & neck  1. No emergent large vessel occlusion or high-grade stenosis of the intracranial arteries. 2. Increased size of left ICA ophthalmic/clinoid segment aneurysm, now measuring 1.7 x 1.4 cm, previously 1.0 x 1.0 cm. 3. Unchanged fusiform  dilatation of the proximal cavernous segment measuring 7 mm. 4. Status post left carotid endarterectomy without stenosis. 40% stenosis of the proximal right internal carotid artery by NASCET criteria. MRI: 1. Punctate foci of acute ischemia in both frontal lobes and left occipital lobe. 2. No acute hemorrhage or mass effect. 3. Chronic ischemic microangiopathy and generalized volume loss. 4. Left ICA aneurysm, unchanged  2D Echo shows EF 40-45% LDL 74 HgbA1c 6.1 VTE prophylaxis - SCDs    Diet   Diet heart healthy/carb modified Room service appropriate? Yes; Fluid consistency: Thin   No AC/AP prior to admission: Per Dr. Kyla Balzarine March 2022 office visit: "She was to have L CEA on May of 2017 - looks like this complicated by a vocal cord paralysis and "with possible TE from extensive aortic arch atherosclerosis".  She was reluctant to start anticoagulation.  She elected to take ASA and Plavix instead.  But then was admitted with profound anemia in the setting of aspirin and Plavix - transfused - had EGD and noted large AVM that was clipped. She is to stay just on aspirin despite being at risk for embolism from the aortic arch atherosclerosis." Ideally, she would be on anticoagulation. Discussed anticoagulation recommendation extensivley with daughter, [name] by phone. She will be at bedside tomorrow to further discuss.  Therapy recommendations: Home health has palliative care following at home after 5 ER visits in two weeks recently. Has had some panic type episodes. Patient is DNR.  Disposition: Home Hypertension Stable Permissive hypertension (OK if < 220/120) but gradually normalize in 5-7 days Long-term BP goal normotensive  Hyperlipidemia LDL 74, almost at goal < 70 at home dose of lipitor 80mg , continue high intensity statin  Other Stroke Risk Factors Advanced Age >/= 1  Former Cigarette smoker, 43 pack years  Hx stroke/TIA 2015 Obstructive sleep apnea, on CPAP at  home Congestive heart failure  Other Active Problems   Hospital day # 1 Patient presented with dysarthria and left hemiparesis with MRI showing bifrontal and left occipital embolic infarcts likely from undetected A. fib .she was not on any antiplatelet therapy in the past due to GI hemorrhage.  Patient also has a terminal left ICA aneurysm which appears to have increased slightly in size.  Given her age she is likely not a candidate for aggressive intervention with stent coiling.    I agree she is not a candidate for endovascular treatment of her terminal carotid artery aneurysm.  Recommend aspirin 81 mg daily and Plavix 75 mg daily for 3 weeks followed by aspirin alone.  Follow-up as an outpatient stroke clinic in 6 weeks greater than 50% time during this 25-minute visit was spent in counseling and coordination of care and discussion with care team.  Discussed with Dr. Su Grand,  Antony Contras, MD Medical Director [facility] Pager: [phone] 12/26/2020 2:10 PM   To contact Stroke Continuity provider, please refer to [email or web address] After hours, contact General Neurology

## 2020-12-26 NOTE — NC FL2 (Signed)
[place] MEDICAID FL2 LEVEL OF CARE SCREENING TOOL     IDENTIFICATION  Patient Name: [name] Birthdate: 07/28/1924 Sex: female Admission Date (Current Location): 12/23/2020  [facility] and [place] Number:  Herbalist and Address:  [facility] [place], [place] [address], [place], [place] [address]      Provider Number: [id number]  Attending Physician Name and Address:  Shawna Clamp, MD  Relative Name and Phone Number:  Jose Persia daughter [phone]    Current Level of Care: Hospital Recommended Level of Care: [place] Prior Approval Number:    Date Approved/Denied:   PASRR Number: [id number]  Discharge Plan: SNF    Current Diagnoses: Patient Active Problem List   Diagnosis Date Noted   Cerebral thrombosis with cerebral infarction 12/25/2020   Right hand weakness 35/00/9381   Acute systolic CHF (congestive heart failure) ([place]) 09/01/2020   Acute respiratory failure (HCC)    Mild aortic stenosis 08/30/2020   Elevated troponin 08/30/2020   Pleural effusion on left 08/30/2020   Pain in joint of left knee 04/10/2020   D-dimer, elevated 01/14/2020   Elevated brain natriuretic peptide (BNP) level 01/14/2020   Advice given about COVID-19 virus infection 07/21/2019   Current chronic use of systemic steroids 07/13/2019   Medication management 04/02/2018   COPD exacerbation ([place]) 03/31/2018   Hypokalemia 03/30/2018   COPD with acute exacerbation ([place]) 03/02/2018   SOB (shortness of breath) 01/12/2018   CKD (chronic kidney disease), stage III 01/12/2018   New onset left bundle branch block (LBBB) 01/12/2018   COPD GOLD I D 01/12/2018   Neurogenic claudication due to lumbar spinal stenosis 09/13/2017   Symptomatic anemia    AVM (arteriovenous malformation) of duodenum, acquired with hemorrhage    Acute GI bleeding 10/09/2016   COPD with emphysema ([place]) 05/10/2016   Recurrent laryngeal neuropathy 02/01/2016   Nausea with  vomiting 12/11/2015   Left carotid stenosis 11/21/2015   Asymptomatic carotid artery stenosis 11/13/2015   Impaired glucose tolerance 10/27/2015   Solitary pulmonary nodule 10/27/2015   Thyroid nodule 10/27/2015   Syncope 10/04/2015   Bradycardia with less than 60 beats per minute 10/04/2015   History of embolic stroke 82/99/3716   Paresthesia of both feet 07/06/2014   Aortic arch atherosclerosis ([place]) 06/24/2014   Atherosclerotic ulcer of aorta ([place]) 06/24/2014   Stroke ([place]) 06/22/2014   Bilateral carotid artery disease ([place]) 05/18/2014   TOBACCO USE, QUIT 04/12/2009   [place] DISEASE, LUMBAR 12/16/2008   DIVERTICULOSIS, COLON 09/30/2008   Personal history of colonic polyps 09/30/2008   DYSPNEA 07/13/2008   Weakness 11/09/2007   Hypothyroidism 10/13/2007   Mixed hyperlipidemia 03/06/2007   Essential hypertension 03/06/2007   Osteoarthritis 03/06/2007    Orientation RESPIRATION BLADDER Height & Weight     Self, Time, Situation, Place  Normal Continent Weight: 58.8 kg Height:  5\' 6"  (167.6 cm)  BEHAVIORAL SYMPTOMS/MOOD NEUROLOGICAL BOWEL NUTRITION STATUS     (n/a) Continent Diet (Heart health / carb modified)  AMBULATORY STATUS COMMUNICATION OF NEEDS Skin   Limited Assist (rolling walker) Verbally Normal                       Personal Care Assistance Level of Assistance  Bathing, Feeding, Dressing, Total care Bathing Assistance: Limited assistance Feeding assistance: Limited assistance (Set up) Dressing Assistance: Limited assistance Total Care Assistance: Limited assistance   Functional Limitations Info  Sight, Hearing, Speech Sight Info: Impaired Hearing Info: Impaired (hearing aid) Speech  Info: Adequate    SPECIAL CARE FACTORS FREQUENCY  PT (By licensed PT), OT (By licensed OT)     PT Frequency: 5X OT Frequency: 5X            Contractures Contractures Info: Not present    Additional Factors Info  Code Status, Allergies, Psychotropic, Insulin  Sliding Scale, Isolation Precautions, Suctioning Needs Code Status Info: DNR Allergies Info: Catapres (Clonidine Hcl), Clonidine, Lisinopril, Labetalol, Labetalol Hcl Psychotropic Info: see d/c summary Insulin Sliding Scale Info: see d/c summary for sliding scale info Isolation Precautions Info: n/a Suctioning Needs: n/a   Current Medications (12/26/2020):  This is the current hospital active medication list Current Facility-Administered Medications  Medication Dose Route Frequency Provider Last Rate Last Admin   acetaminophen (TYLENOL) tablet 650 mg  650 mg Oral Q6H PRN Hall, Carole N, DO       albuterol (PROVENTIL) (2.5 MG/3ML) 0.083% nebulizer solution 2.5 mg  2.5 mg Inhalation Q6H PRN Opyd, Ilene Qua, MD       ALPRAZolam Duanne Moron) tablet 0.5 mg  0.5 mg Oral BID Long, Wonda Olds, MD   0.5 mg at 12/26/20 0901   ALPRAZolam Duanne Moron) tablet 0.5 mg  0.5 mg Oral TID PRN Margette Fast, MD   0.5 mg at 12/25/20 2129   amLODipine (NORVASC) tablet 5 mg  5 mg Oral Daily Long, Wonda Olds, MD   5 mg at 12/26/20 0900   aspirin tablet 325 mg  325 mg Oral Daily Irene Pap N, DO   325 mg at 12/26/20 0900   atorvastatin (LIPITOR) tablet 80 mg  80 mg Oral Daily Long, Wonda Olds, MD   80 mg at 12/26/20 0900   Chlorhexidine Gluconate Cloth 2 % PADS 6 each  6 each Topical Daily Opyd, Ilene Qua, MD   6 each at 12/26/20 0901   enoxaparin (LOVENOX) injection 30 mg  30 mg Subcutaneous Daily Irene Pap N, DO   30 mg at 12/26/20 0901   furosemide (LASIX) tablet 20 mg  20 mg Oral Daily Long, Wonda Olds, MD   20 mg at 12/26/20 0901   hydrALAZINE (APRESOLINE) tablet 50 mg  50 mg Oral TID Margette Fast, MD   50 mg at 12/26/20 0616   insulin aspart (novoLOG) injection 0-5 Units  0-5 Units Subcutaneous QHS Hall, Carole N, DO       insulin aspart (novoLOG) injection 0-9 Units  0-9 Units Subcutaneous TID WC Hall, Carole N, DO       ipratropium-albuterol (DUONEB) 0.5-2.5 (3) MG/3ML nebulizer solution 3 mL  3 mL Nebulization Q6H PRN  Long, Wonda Olds, MD       isosorbide mononitrate (IMDUR) 24 hr tablet 30 mg  30 mg Oral Daily Long, Wonda Olds, MD   30 mg at 12/26/20 0900   latanoprost (XALATAN) 0.005 % ophthalmic solution 1 drop  1 drop Both Eyes QHS Long, Wonda Olds, MD   1 drop at 12/25/20 2131   levothyroxine (SYNTHROID) tablet 75 mcg  75 mcg Oral QAC breakfast Margette Fast, MD   75 mcg at 12/26/20 0901   melatonin tablet 3 mg  3 mg Oral QHS PRN Irene Pap N, DO       ondansetron (ZOFRAN) injection 4 mg  4 mg Intravenous Q6H PRN Hall, Carole N, DO       pantoprazole (PROTONIX) EC tablet 40 mg  40 mg Oral Daily Long, Wonda Olds, MD   40 mg at 12/26/20 0900   polyvinyl alcohol (LIQUIFILM TEARS)  1.4 % ophthalmic solution 1 drop  1 drop Both Eyes PRN Shawna Clamp, MD   1 drop at 12/25/20 1206   potassium chloride SA (KLOR-CON) CR tablet 20 mEq  20 mEq Oral Daily Long, Wonda Olds, MD   20 mEq at 12/26/20 0901   predniSONE (DELTASONE) tablet 5 mg  5 mg Oral Q breakfast Long, Wonda Olds, MD   5 mg at 12/26/20 0900   senna-docusate (Senokot-S) tablet 2 tablet  2 tablet Oral QHS Kayleen Memos, DO   2 tablet at 12/25/20 2129     Discharge Medications: Please see discharge summary for a list of discharge medications.  Relevant Imaging Results:  Relevant Lab Results:   Additional Information SS# [id number]  Angelita Ingles, RN

## 2020-12-26 NOTE — TOC Initial Note (Signed)
Transition of Care [facility]) - Initial/Assessment Note    Patient Details  Name: [name] MRN: [id number] Date of Birth: 1925/02/17  [facility]) CM/SW Contact:    Angelita Ingles, RN Phone Number:  12/26/2020, 2:21 PM  Clinical Narrative:                 [facility] consulted for disposition needs. Patient comes from home where she lives alone and has family to com in to check on her and bring her food. Patient has PCP and transportation to appointments and to run errands. Daughter [name] at the bedside states that she is the [place] and POA. List of choice for SNF has been given to [facility] has been initiated. Message has been sent to MD for signature on FL2 and Covid test if MD feels that patient will be ready for discharge on 6/22. Daughter [name] has been updated on current bed offers and is currently following up with facilities because she has questions about nursing staffing and reviews. TOC will continue to follow.   Expected Discharge Plan: Skilled Nursing Facility Barriers to Discharge: Continued Medical Work up   Patient Goals and CMS Choice Patient states their goals for this hospitalization and ongoing recovery are:: Wants to get strong enough to go back home. CMS Medicare.gov Compare Post Acute Care list provided to:: Patient Represenative (must comment) Choice offered to / list presented to : Patient, Adult Children  Expected Discharge Plan and Services Expected Discharge Plan: [place] In-house Referral: NA Discharge Planning Services: CM Consult Post Acute Care Choice: [place] Living arrangements for the past 2 months: Single Family Home                 DME Arranged: N/A DME Agency: NA       HH Arranged: NA HH Agency: NA        Prior Living Arrangements/Services Living arrangements for the past 2 months: Single Family Home Lives with:: Self Patient language and need for interpreter reviewed::  Yes Do you feel safe going back to the place where you live?: Yes      Need for Family Participation in Patient Care: Yes (Comment) Care giver support system in place?: Yes (comment) Current home services:  (n/a) Criminal Activity/Legal Involvement Pertinent to Current Situation/Hospitalization: No - Comment as needed  Activities of Daily Living Home Assistive Devices/Equipment: Other (Comment) ADL Screening (condition at time of admission) Patient's cognitive ability adequate to safely complete daily activities?: Yes Is the patient deaf or have difficulty hearing?: Yes Does the patient have difficulty seeing, even when wearing glasses/contacts?: No Does the patient have difficulty concentrating, remembering, or making decisions?: No Patient able to express need for assistance with ADLs?: No Does the patient have difficulty dressing or bathing?: No Independently performs ADLs?: No Communication: Independent Dressing (OT): Needs assistance Is this a change from baseline?: Change from baseline, expected to last <3days Grooming: Independent with device (comment) Feeding: Independent Bathing: Needs assistance Is this a change from baseline?: Change from baseline, expected to last <3 days Toileting: Independent with device (comment) In/Out Bed: Independent with device (comment) Walks in Home: Needs assistance Is this a change from baseline?: Change from baseline, expected to last <3 days Does the patient have difficulty walking or climbing stairs?: Yes Weakness of Legs: Both Weakness of Arms/Hands: Both  Permission Sought/Granted   Permission granted to share information with : Yes, Verbal Permission Granted  Share Information with NAME:  [name]     Permission granted to share info w Relationship: daughter     Emotional Assessment Appearance:: Appears stated age Attitude/Demeanor/Rapport: Gracious Affect (typically observed): Pleasant Orientation: : Oriented to Self,  Oriented to Place, Oriented to  Time, Oriented to Situation Alcohol / Substance Use: Not Applicable Psych Involvement: Yes (comment)  Admission diagnosis:  Urinary retention [R33.9] Right hand weakness [R29.898] Right sided weakness [R53.1] Patient Active Problem List   Diagnosis Date Noted   Cerebral thrombosis with cerebral infarction 12/25/2020   Right hand weakness 45/40/9811   Acute systolic CHF (congestive heart failure) ([place]) 09/01/2020   Acute respiratory failure (HCC)    Mild aortic stenosis 08/30/2020   Elevated troponin 08/30/2020   Pleural effusion on left 08/30/2020   Pain in joint of left knee 04/10/2020   D-dimer, elevated 01/14/2020   Elevated brain natriuretic peptide (BNP) level 01/14/2020   Advice given about COVID-19 virus infection 07/21/2019   Current chronic use of systemic steroids 07/13/2019   Medication management 04/02/2018   COPD exacerbation ([place]) 03/31/2018   Hypokalemia 03/30/2018   COPD with acute exacerbation ([place]) 03/02/2018   SOB (shortness of breath) 01/12/2018   CKD (chronic kidney disease), stage III 01/12/2018   New onset left bundle branch block (LBBB) 01/12/2018   COPD GOLD I D 01/12/2018   Neurogenic claudication due to lumbar spinal stenosis 09/13/2017   Symptomatic anemia    AVM (arteriovenous malformation) of duodenum, acquired with hemorrhage    Acute GI bleeding 10/09/2016   COPD with emphysema ([place]) 05/10/2016   Recurrent laryngeal neuropathy 02/01/2016   Nausea with vomiting 12/11/2015   Left carotid stenosis 11/21/2015   Asymptomatic carotid artery stenosis 11/13/2015   Impaired glucose tolerance 10/27/2015   Solitary pulmonary nodule 10/27/2015   Thyroid nodule 10/27/2015   Syncope 10/04/2015   Bradycardia with less than 60 beats per minute 10/04/2015   History of embolic stroke 91/47/8295   Paresthesia of both feet 07/06/2014   Aortic arch atherosclerosis ([place]) 06/24/2014   Atherosclerotic ulcer of aorta ([place])  06/24/2014   Stroke ([place]) 06/22/2014   Bilateral carotid artery disease ([place]) 05/18/2014   TOBACCO USE, QUIT 04/12/2009   [place] DISEASE, LUMBAR 12/16/2008   DIVERTICULOSIS, COLON 09/30/2008   Personal history of colonic polyps 09/30/2008   DYSPNEA 07/13/2008   Weakness 11/09/2007   Hypothyroidism 10/13/2007   Mixed hyperlipidemia 03/06/2007   Essential hypertension 03/06/2007   Osteoarthritis 03/06/2007   PCP:  [name], NP Pharmacy:   [id number] - [place], [place] [address] [place] [address] Phone: [phone] Fax: [phone]     Social Determinants of Health (SDOH) Interventions    Readmission Risk Interventions Readmission Risk Prevention Plan 12/26/2020  Transportation Screening Complete  Medication Review Press photographer) Complete  PCP or Specialist appointment within 3-5 days of discharge Complete  HRI or Home Care Consult Complete  SW Recovery Care/Counseling Consult Complete  Palliative Care Screening Not [place] Complete  Some recent data might be hidden

## 2020-12-26 NOTE — Progress Notes (Signed)
OT Cancellation Note  Patient Details Name: [name] MRN: [id number] DOB: Nov 29, 1924   Cancelled Treatment:    Reason Eval/Treat Not Completed: Patient at procedure or test/ unavailable;Other (comment) pt speaking with MD upon arrival, will check back as time allows for OT session.  Corinne Ports K., COTA/L Acute Rehabilitation Services [phone]   Precious Haws 12/26/2020, 9:12 AM

## 2020-12-27 ENCOUNTER — Inpatient Hospital Stay (HOSPITAL_COMMUNITY): Payer: Medicare PPO

## 2020-12-27 ENCOUNTER — Other Ambulatory Visit: Payer: Self-pay | Admitting: Physician Assistant

## 2020-12-27 DIAGNOSIS — I6523 Occlusion and stenosis of bilateral carotid arteries: Secondary | ICD-10-CM

## 2020-12-27 DIAGNOSIS — I639 Cerebral infarction, unspecified: Secondary | ICD-10-CM

## 2020-12-27 LAB — CBC
HCT: 32.4 % — ABNORMAL LOW (ref 36.0–46.0)
Hemoglobin: 10.4 g/dL — ABNORMAL LOW (ref 12.0–15.0)
MCH: 30.9 pg (ref 26.0–34.0)
MCHC: 32.1 g/dL (ref 30.0–36.0)
MCV: 96.1 fL (ref 80.0–100.0)
Platelets: 115 10*3/uL — ABNORMAL LOW (ref 150–400)
RBC: 3.37 MIL/uL — ABNORMAL LOW (ref 3.87–5.11)
RDW: 13.2 % (ref 11.5–15.5)
WBC: 4.1 10*3/uL (ref 4.0–10.5)
nRBC: 0 % (ref 0.0–0.2)

## 2020-12-27 LAB — BASIC METABOLIC PANEL
Anion gap: 8 (ref 5–15)
BUN: 30 mg/dL — ABNORMAL HIGH (ref 8–23)
CO2: 27 mmol/L (ref 22–32)
Calcium: 8.4 mg/dL — ABNORMAL LOW (ref 8.9–10.3)
Chloride: 102 mmol/L (ref 98–111)
Creatinine, Ser: 1.39 mg/dL — ABNORMAL HIGH (ref 0.44–1.00)
GFR, Estimated: 35 mL/min — ABNORMAL LOW (ref >60)
Glucose, Bld: 115 mg/dL — ABNORMAL HIGH (ref 70–99)
Potassium: 3.7 mmol/L (ref 3.5–5.1)
Sodium: 137 mmol/L (ref 135–145)

## 2020-12-27 LAB — MAGNESIUM: Magnesium: 2.1 mg/dL (ref 1.7–2.4)

## 2020-12-27 LAB — PHOSPHORUS: Phosphorus: 3.8 mg/dL (ref 2.5–4.6)

## 2020-12-27 MED ORDER — CLOPIDOGREL BISULFATE 75 MG PO TABS: 75.0000 mg | ORAL_TABLET | Freq: Every day | ORAL | 0 refills | Status: AC

## 2020-12-27 MED ORDER — ASPIRIN 81 MG PO TBEC: 81.0000 mg | DELAYED_RELEASE_TABLET | Freq: Every day | ORAL | Status: AC

## 2020-12-27 NOTE — TOC Progression Note (Signed)
Transition of Care [facility]) - Progression Note    Patient Details  Name: [name] MRN: [id number] Date of Birth: April 11, 1925  Transition of Care [facility]) CM/SW [place], RN Phone Number:[phone]  12/27/2020, 12:43 PM  Clinical Narrative:    Patient has declined SNF services and will now go home with [facility] services resumed. Patient is currently active with [facility]. CM has notified [facility] of need for resumption of services. Orders have been entered and services will resume once patient is discharged.    Expected Discharge Plan: [place] Barriers to Discharge: Continued Medical Work up  Expected Discharge Plan and Services Expected Discharge Plan: [place] In-house Referral: NA Discharge Planning Services: CM Consult Post Acute Care Choice: [place] Living arrangements for the past 2 months: Single Family Home                 DME Arranged: N/A DME Agency: NA       HH Arranged: NA HH Agency: NA         Social Determinants of Health (SDOH) Interventions    Readmission Risk Interventions Readmission Risk Prevention Plan 12/26/2020  Transportation Screening Complete  Medication Review Press photographer) Complete  PCP or Specialist appointment within 3-5 days of discharge Complete  HRI or [place] Complete  SW Recovery Care/Counseling Consult Complete  Palliative Care Screening Not [place] Complete  Some recent data might be hidden

## 2020-12-27 NOTE — Progress Notes (Signed)
   Was informed by Dr. Billie Ruddy that the patient had not received her monitor prior to discharge. It was ordered, not sure why not put on.  Spoke with daughter on the phone, and to Ms. Font as well.  Advised we will very sorry for the mixup, but will get the monitor mailed to her.  Have ordered a 14-day live telemetry monitor and sent a message to Tonye Pearson.  Will send a message to schedulers to move out of her appointment, but the daughter stated that she would call as well.  Rosaria Ferries, PA-C 12/27/2020 6:45 PM

## 2020-12-27 NOTE — Plan of Care (Signed)
  Problem: Education: Goal: Knowledge of General Education information will improve Description: Including pain rating scale, medication(s)/side effects and non-pharmacologic comfort measures Outcome: Adequate for Discharge   Problem: Health Behavior/Discharge Planning: Goal: Ability to manage health-related needs will improve Outcome: Adequate for Discharge   Problem: Clinical Measurements: Goal: Ability to maintain clinical measurements within normal limits will improve Outcome: Adequate for Discharge Goal: Will remain free from infection Outcome: Adequate for Discharge Goal: Diagnostic test results will improve Outcome: Adequate for Discharge Goal: Respiratory complications will improve Outcome: Adequate for Discharge Goal: Cardiovascular complication will be avoided Outcome: Adequate for Discharge   Problem: Activity: Goal: Risk for activity intolerance will decrease Outcome: Adequate for Discharge   Problem: Nutrition: Goal: Adequate nutrition will be maintained Outcome: Adequate for Discharge   Problem: Coping: Goal: Level of anxiety will decrease Outcome: Adequate for Discharge   Problem: Elimination: Goal: Will not experience complications related to bowel motility Outcome: Adequate for Discharge Goal: Will not experience complications related to urinary retention Outcome: Adequate for Discharge   Problem: Pain Managment: Goal: General experience of comfort will improve Outcome: Adequate for Discharge   Problem: Safety: Goal: Ability to remain free from injury will improve Outcome: Adequate for Discharge   Problem: Skin Integrity: Goal: Risk for impaired skin integrity will decrease Outcome: Adequate for Discharge   Problem: Education: Goal: Knowledge of disease or condition will improve Outcome: Adequate for Discharge Goal: Knowledge of secondary prevention will improve Outcome: Adequate for Discharge Goal: Knowledge of patient specific risk factors  addressed and post discharge goals established will improve Outcome: Adequate for Discharge Goal: Individualized Educational Video(s) Outcome: Adequate for Discharge   Problem: Coping: Goal: Will verbalize positive feelings about self Outcome: Adequate for Discharge Goal: Will identify appropriate support needs Outcome: Adequate for Discharge   Problem: Health Behavior/Discharge Planning: Goal: Ability to manage health-related needs will improve Outcome: Adequate for Discharge   Problem: Self-Care: Goal: Ability to participate in self-care as condition permits will improve Outcome: Adequate for Discharge Goal: Verbalization of feelings and concerns over difficulty with self-care will improve Outcome: Adequate for Discharge Goal: Ability to communicate needs accurately will improve Outcome: Adequate for Discharge   Problem: Nutrition: Goal: Risk of aspiration will decrease Outcome: Adequate for Discharge Goal: Dietary intake will improve Outcome: Adequate for Discharge   Problem: Intracerebral Hemorrhage Tissue Perfusion: Goal: Complications of Intracerebral Hemorrhage will be minimized Outcome: Adequate for Discharge   Problem: Ischemic Stroke/TIA Tissue Perfusion: Goal: Complications of ischemic stroke/TIA will be minimized Outcome: Adequate for Discharge   Problem: Spontaneous Subarachnoid Hemorrhage Tissue Perfusion: Goal: Complications of Spontaneous Subarachnoid Hemorrhage will be minimized Outcome: Adequate for Discharge   Problem: Acute Rehab PT Goals(only PT should resolve) Goal: Pt Will Go Supine/Side To Sit Outcome: Adequate for Discharge Goal: Patient Will Transfer Sit To/From Stand Outcome: Adequate for Discharge Goal: Pt Will Ambulate Outcome: Adequate for Discharge   Problem: Acute Rehab OT Goals (only OT should resolve) Goal: Pt. Will Perform Grooming Outcome: Adequate for Discharge Goal: Pt. Will Perform Upper Body Dressing Outcome: Adequate for  Discharge Goal: Pt. Will Perform Lower Body Dressing Outcome: Adequate for Discharge Goal: Pt. Will Transfer To Toilet Outcome: Adequate for Discharge Goal: Pt. Will Perform Toileting-Clothing Manipulation Outcome: Adequate for Discharge Goal: Pt/Caregiver Will Perform Home Exercise Program Outcome: Adequate for Discharge

## 2020-12-27 NOTE — Discharge Summary (Signed)
Physician Discharge Summary   [name]  female DOB: [birth year]  TKW:[id number]  PCP: [name], NP  Admit date: 12/23/2020 Discharge date: 12/27/2020  Admitted From: home Disposition:  home Home Health: Yes CODE STATUS: DNR  Discharge Instructions     Discharge instructions   Complete by: As directed    Please take aspirin 81 mg and plavix together for 3 weeks, then just aspirin alone, per neurologist recommendation.  I looked at your MRI brain report, you have no hemorrhage.    Please follow up with cardiology for assessment of Afib and whether or not to start Eliquis.   Dr. Enzo Bi [facility] Course:  For full details, please see H&P, progress notes, consult notes and ancillary notes.  Briefly,  [name] is a 87 years old female with PMH significant for prior CVA in 2015, former tobacco user, quit 4 years ago, COPD, CKD stage IIIb, chronic anxiety, hypertension, hyperlipidemia, hypothyroidism, GERD presented in the ED with right-sided weakness for 2 days.    Right hand/right-sided weakness 2/2 Acute stroke She presented with right hand weakness, dropping things associated with mild right lower extremity weakness.   Last known well 12/21/2020. Outside of tPA window. CT head revealed no acute changes, CTA head and neck revealed increased size of left ICA ophthalmic segment aneurysm. MRI : Punctate foci of acute ischemia in both frontal lobes and left occipital lobe. Echo showed no atrial level shunt. Lipid/A1c : LDL 74 Neurology recommended aspirin and Plavix for 3 weeks followed by aspirin only. Due to suspicion for embolic strokes, 92-EQA cardiac monitor ordered and pt will follow up with outpatient cardiology for evaluation of Afib.   Left ICA ophthalmic/clinoid segment aneurysm  MRI reported unchanged aneurysm.   Essential hypertension BP stable. Continue home medications.   Type 2 diabetes: Controlled. Hb A1C 6.1.  Not on home  hypoglycemics agents.   Hyperlipidemia Goal LDL less than 70.   Continue home statin.   Combined chronic diastolic and systolic CHF Last 2D echo from 08/31/2020 revealed LVEF 40 to 45% and grade 1 diastolic dysfunction. Euvolemic on exam. Cont home lasix.   Hypothyroidism: Continue levothyroxine   COPD on chronic steroid use Currently stable Continue inhalers.   Chronic anxiety Continue Xanax   GERD/constipation Continue Protonix/Colace   Ambulatory dysfunction Uses a walker to ambulate   Acute urinary retention, resolved Patient required Foley catheter when admitted.  Passed voiding trial.   Discharge Diagnoses:  Active Problems:   Right hand weakness   Cerebral thrombosis with cerebral infarction   30 Day Unplanned Readmission Risk Score    Flowsheet Row ED to Hosp-Admission (Current) from 12/23/2020 in [facility]  30 Day Unplanned Readmission Risk Score (%) 52.76 Filed at 12/27/2020 1200       This score is the patient's risk of an unplanned readmission within 30 days of being discharged (0 -100%). The score is based on dignosis, age, lab data, medications, orders, and past utilization.   Low:  0-14.9   Medium: 15-21.9   High: 22-29.9   Extreme: 30 and above          Discharge Instructions:  Allergies as of 12/27/2020       Reactions   Catapres [clonidine Hcl] Other (See Comments)   Made pt feel horrible, shaky, weak and nausea   Clonidine Other (See Comments)   Made pt feel horrible, shaky, weak and nausea  Lisinopril Anaphylaxis   Tongue swelling   Labetalol Other (See Comments)   Caused bradycardia and syncope   Labetalol Hcl Other (See Comments)   Caused bradycardia and syncope        Medication List     STOP taking these medications    AeroChamber MV inhaler       TAKE these medications    albuterol 108 (90 Base) MCG/ACT inhaler Commonly known as: ProAir HFA Inhale 2 puffs into the lungs every 6 (six)  hours as needed for wheezing or shortness of breath.   ALPRAZolam 0.5 MG tablet Commonly known as: Xanax Take 1 tablet (0.5 mg total) by mouth 2 (two) times daily.   amLODipine 5 MG tablet Commonly known as: NORVASC Take 1 tablet (5 mg total) by mouth daily.   aspirin 81 MG EC tablet Take 1 tablet (81 mg total) by mouth daily. Swallow whole. Start taking on: December 28, 2020   atorvastatin 80 MG tablet Commonly known as: LIPITOR TAKE 1 TABLET BY MOUTH EVERY DAY AT 6PM What changed:  how much to take how to take this when to take this additional instructions   clopidogrel 75 MG tablet Commonly known as: PLAVIX Take 1 tablet (75 mg total) by mouth daily for 21 days. Start taking on: December 28, 2020   doxepin 25 MG capsule Commonly known as: SINEQUAN Take 25 mg by mouth at bedtime.   furosemide 20 MG tablet Commonly known as: LASIX Take 1 tablet (20 mg total) by mouth daily.   hydrALAZINE 50 MG tablet Commonly known as: APRESOLINE Take 1 tablet (50 mg total) by mouth 3 (three) times daily.   ipratropium-albuterol 0.5-2.5 (3) MG/3ML Soln Commonly known as: DUONEB Take 3 mLs by nebulization every 6 (six) hours as needed (copd).   isosorbide mononitrate 30 MG 24 hr tablet Commonly known as: IMDUR Take 1 tablet (30 mg total) by mouth daily.   latanoprost 0.005 % ophthalmic solution Commonly known as: XALATAN Place 1 drop into both eyes at bedtime.   levothyroxine 75 MCG tablet Commonly known as: SYNTHROID Take 1 tablet (75 mcg total) by mouth daily before breakfast.   pantoprazole 40 MG tablet Commonly known as: PROTONIX TAKE 1 TABLET BY MOUTH EVERY DAY   potassium chloride SA 20 MEQ tablet Commonly known as: KLOR-CON Take 1 tablet (20 mEq total) by mouth daily.   predniSONE 5 MG tablet Commonly known as: DELTASONE Take 5 mg by mouth daily with breakfast.   Trelegy Ellipta 100-62.5-25 MCG/INH Aepb Generic drug: Fluticasone-Umeclidin-Vilant INHALE 1 PUFF BY  MOUTH EVERY DAY What changed: See the new instructions.          Allergies  Allergen Reactions   Catapres [Clonidine Hcl] Other (See Comments)    Made pt feel horrible, shaky, weak and nausea   Clonidine Other (See Comments)    Made pt feel horrible, shaky, weak and nausea   Lisinopril Anaphylaxis    Tongue swelling   Labetalol Other (See Comments)    Caused bradycardia and syncope   Labetalol Hcl Other (See Comments)    Caused bradycardia and syncope     The results of significant diagnostics from this hospitalization (including imaging, microbiology, ancillary and laboratory) are listed below for reference.   Consultations:   Procedures/Studies: CT Angio Head W or Wo Contrast  Result Date: 12/23/2020 CLINICAL DATA:  Right hand weakness EXAM: CT ANGIOGRAPHY HEAD AND NECK TECHNIQUE: Multidetector CT imaging of the head and neck was performed using the standard protocol  during bolus administration of intravenous contrast. Multiplanar CT image reconstructions and MIPs were obtained to evaluate the vascular anatomy. Carotid stenosis measurements (when applicable) are obtained utilizing NASCET criteria, using the distal internal carotid diameter as the denominator. CONTRAST:  6mL OMNIPAQUE IOHEXOL 350 MG/ML SOLN COMPARISON:  11/21/2015 FINDINGS: CT HEAD FINDINGS Brain: There is no mass, hemorrhage or extra-axial collection. The size and configuration of the ventricles and extra-axial CSF spaces are normal. There is hypoattenuation of the periventricular white matter, most commonly indicating chronic ischemic microangiopathy. Skull: The visualized skull base, calvarium and extracranial soft tissues are normal. Sinuses/Orbits: No fluid levels or advanced mucosal thickening of the visualized paranasal sinuses. No mastoid or middle ear effusion. The orbits are normal. CTA NECK FINDINGS SKELETON: Multilevel degenerative disc disease and facet hypertrophy without bony spinal canal stenosis.  OTHER NECK: Normal pharynx, larynx and major salivary glands. No cervical lymphadenopathy. Unremarkable thyroid gland. UPPER CHEST: Biapical emphysema. AORTIC ARCH: There a large amount of soft and calcific atherosclerosis of the aortic arch. There is no aneurysm, dissection or hemodynamically significant stenosis of the visualized portion of the aorta. Conventional 3 vessel aortic branching pattern. The visualized proximal subclavian arteries are widely patent. RIGHT CAROTID SYSTEM: No dissection, occlusion or aneurysm. There is calcific atherosclerosis extending into the proximal ICA, resulting in 40% stenosis. LEFT CAROTID SYSTEM: 1.6 cm dilatation of the carotid bulb is unchanged. No stenosis. Status post endarterectomy. VERTEBRAL ARTERIES: Right dominant configuration. Both origins are clearly patent. There is no dissection, occlusion or flow-limiting stenosis to the skull base (V1-V3 segments). CTA HEAD FINDINGS POSTERIOR CIRCULATION: --Vertebral arteries: Atherosclerotic calcification on the right without stenosis. --Inferior cerebellar arteries: Normal. --Basilar artery: Normal. --Superior cerebellar arteries: Normal. --Posterior cerebral arteries (PCA): Normal. ANTERIOR CIRCULATION: --Intracranial internal carotid arteries: Aneurysm of the left ophthalmic/clinoid segment measures 1.7 x 1.4 cm, previously 1.0 x 1.0 cm. There is fusiform aneurysmal dilatation of the proximal cavernous segment measuring 7 mm. --Anterior cerebral arteries (ACA): Normal. Absent right A1 segment, normal variant --Middle cerebral arteries (MCA): Normal. VENOUS SINUSES: As permitted by contrast timing, patent. ANATOMIC VARIANTS: None Review of the MIP images confirms the above findings. IMPRESSION: 1. No emergent large vessel occlusion or high-grade stenosis of the intracranial arteries. 2. Increased size of left ICA ophthalmic/clinoid segment aneurysm, now measuring 1.7 x 1.4 cm, previously 1.0 x 1.0 cm. 3. Unchanged fusiform  dilatation of the proximal cavernous segment measuring 7 mm. 4. Status post left carotid endarterectomy without stenosis. 40% stenosis of the proximal right internal carotid artery by NASCET criteria. Aortic Atherosclerosis (ICD10-I70.0) and Emphysema (ICD10-J43.9). Electronically Signed   By: Ulyses Jarred M.D.   On: 12/23/2020 21:35   CT Angio Neck W and/or Wo Contrast  Result Date: 12/23/2020 CLINICAL DATA:  Right hand weakness EXAM: CT ANGIOGRAPHY HEAD AND NECK TECHNIQUE: Multidetector CT imaging of the head and neck was performed using the standard protocol during bolus administration of intravenous contrast. Multiplanar CT image reconstructions and MIPs were obtained to evaluate the vascular anatomy. Carotid stenosis measurements (when applicable) are obtained utilizing NASCET criteria, using the distal internal carotid diameter as the denominator. CONTRAST:  70mL OMNIPAQUE IOHEXOL 350 MG/ML SOLN COMPARISON:  11/21/2015 FINDINGS: CT HEAD FINDINGS Brain: There is no mass, hemorrhage or extra-axial collection. The size and configuration of the ventricles and extra-axial CSF spaces are normal. There is hypoattenuation of the periventricular white matter, most commonly indicating chronic ischemic microangiopathy. Skull: The visualized skull base, calvarium and extracranial soft tissues are normal. Sinuses/Orbits: No fluid levels  or advanced mucosal thickening of the visualized paranasal sinuses. No mastoid or middle ear effusion. The orbits are normal. CTA NECK FINDINGS SKELETON: Multilevel degenerative disc disease and facet hypertrophy without bony spinal canal stenosis. OTHER NECK: Normal pharynx, larynx and major salivary glands. No cervical lymphadenopathy. Unremarkable thyroid gland. UPPER CHEST: Biapical emphysema. AORTIC ARCH: There a large amount of soft and calcific atherosclerosis of the aortic arch. There is no aneurysm, dissection or hemodynamically significant stenosis of the visualized portion  of the aorta. Conventional 3 vessel aortic branching pattern. The visualized proximal subclavian arteries are widely patent. RIGHT CAROTID SYSTEM: No dissection, occlusion or aneurysm. There is calcific atherosclerosis extending into the proximal ICA, resulting in 40% stenosis. LEFT CAROTID SYSTEM: 1.6 cm dilatation of the carotid bulb is unchanged. No stenosis. Status post endarterectomy. VERTEBRAL ARTERIES: Right dominant configuration. Both origins are clearly patent. There is no dissection, occlusion or flow-limiting stenosis to the skull base (V1-V3 segments). CTA HEAD FINDINGS POSTERIOR CIRCULATION: --Vertebral arteries: Atherosclerotic calcification on the right without stenosis. --Inferior cerebellar arteries: Normal. --Basilar artery: Normal. --Superior cerebellar arteries: Normal. --Posterior cerebral arteries (PCA): Normal. ANTERIOR CIRCULATION: --Intracranial internal carotid arteries: Aneurysm of the left ophthalmic/clinoid segment measures 1.7 x 1.4 cm, previously 1.0 x 1.0 cm. There is fusiform aneurysmal dilatation of the proximal cavernous segment measuring 7 mm. --Anterior cerebral arteries (ACA): Normal. Absent right A1 segment, normal variant --Middle cerebral arteries (MCA): Normal. VENOUS SINUSES: As permitted by contrast timing, patent. ANATOMIC VARIANTS: None Review of the MIP images confirms the above findings. IMPRESSION: 1. No emergent large vessel occlusion or high-grade stenosis of the intracranial arteries. 2. Increased size of left ICA ophthalmic/clinoid segment aneurysm, now measuring 1.7 x 1.4 cm, previously 1.0 x 1.0 cm. 3. Unchanged fusiform dilatation of the proximal cavernous segment measuring 7 mm. 4. Status post left carotid endarterectomy without stenosis. 40% stenosis of the proximal right internal carotid artery by NASCET criteria. Aortic Atherosclerosis (ICD10-I70.0) and Emphysema (ICD10-J43.9). Electronically Signed   By: Ulyses Jarred M.D.   On: 12/23/2020 21:35   MR  BRAIN WO CONTRAST  Result Date: 12/25/2020 CLINICAL DATA:  Right-sided weakness EXAM: MRI HEAD WITHOUT CONTRAST TECHNIQUE: Multiplanar, multiecho pulse sequences of the brain and surrounding structures were obtained without intravenous contrast. COMPARISON:  11/28/2020 FINDINGS: Brain: There are punctate foci of abnormal diffusion restriction in both frontal lobes and in the left occipital lobe. Minimal chronic microhemorrhage. There is multifocal hyperintense T2-weighted signal within the white matter. Generalized volume loss without a clear lobar predilection. The midline structures are normal. Vascular: Left ICA aneurysm again demonstrated. Skull and upper cervical spine: Normal calvarium and skull base. Narrowing of the cervical spinal at the C2 level. Sinuses/Orbits:No paranasal sinus fluid levels or advanced mucosal thickening. Right mastoid effusion. Normal orbits. IMPRESSION: 1. Punctate foci of acute ischemia in both frontal lobes and left occipital lobe. 2. No acute hemorrhage or mass effect. 3. Chronic ischemic microangiopathy and generalized volume loss. 4. Left ICA aneurysm, unchanged Electronically Signed   By: Ulyses Jarred M.D.   On: 12/25/2020 03:01   MR BRAIN WO CONTRAST  Result Date: 11/28/2020 CLINICAL DATA:  Stroke suspected. Balance issues. Double vision. Left-sided facial droop. EXAM: MRI HEAD WITHOUT CONTRAST TECHNIQUE: Multiplanar, multiecho pulse sequences of the brain and surrounding structures were obtained without intravenous contrast. COMPARISON:  MRI Nov 21, 2015.  CTA Nov 21, 2015. FINDINGS: Brain: No acute infarction, hemorrhage, hydrocephalus, or extra-axial collection. Moderate generalized atrophy with ex vacuo ventricular dilation, similar to prior. There are  couple small foci of susceptibility artifact in the left frontal lobe, which may represent prior microhemorrhages. Mild for age scattered T2/FLAIR hyperintensities within the white matter, most likely related to chronic  microvascular ischemic disease. Suspected approximately 8 mm pituitary cyst. No significant suprasellar extension or mass effect on the optic chiasm. Vascular: Major arterial flow voids are maintained skull base. Dolichoectasia of the left internal carotid artery with aneurysmal dilation of the distal left cavernous segment. Although evaluation is limited on this noncontrast MRI, there is suspected interval enlargement of the aneurysm. Skull and upper cervical spine: Normal marrow signal. Sinuses/Orbits: Clear sinuses.  Unremarkable orbits. Other: Moderate right mastoid effusion. IMPRESSION: 1. No acute infarct. 2. Dolichoectasia of the left internal carotid artery with aneurysmal dilation of the distal left cavernous segment. Although evaluation is limited on this noncontrast MRI, there is suspected interval enlargement of the aneurysm and a CTA or MRA (CTA is preferred to allow direct comparison to 2017 prior) is recommended to further evaluate. 3. 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 While evaluation is limited on this standard noncontrast MRI, the cyst appears similar or decreased in size when comparing to the 2017 prior. No significant suprasellar extension. Pituitary protocol MRI could further evaluate if clinically indicated. 4. Mild chronic microvascular ischemic disease and moderate atrophy. 5. Chronic moderate right mastoid effusion. Electronically Signed   By: Margaretha Sheffield MD   On: 11/28/2020 10:22   DG Chest Portable 1 View  Result Date: 11/29/2020 CLINICAL DATA:  Shortness of breath, mid back pain for 2 hours EXAM: PORTABLE CHEST 1 VIEW COMPARISON:  11/21/2020 FINDINGS: Cardiomegaly. Both lungs are clear. Unchanged small left pleural effusion. IMPRESSION: Cardiomegaly. Unchanged small left pleural effusion. No new airspace opacity. Electronically Signed   By: Eddie Candle M.D.   On: 11/29/2020 13:13   DG Foot Complete  Right  Result Date: 11/28/2020 CLINICAL DATA:  Stubbed toe on furniture EXAM: RIGHT FOOT COMPLETE - 3+ VIEW COMPARISON:  None. FINDINGS: There is no evidence of fracture or dislocation. There is no evidence of arthropathy or other focal bone abnormality. IMPRESSION: No acute fracture. Electronically Signed   By: Macy Mis M.D.   On: 11/28/2020 11:43   ECHOCARDIOGRAM COMPLETE  Result Date: 12/25/2020    ECHOCARDIOGRAM REPORT   Patient Name:   [name] Date of Exam: 12/25/2020 Medical Rec #:  [id number]     Height:       66.0 in Accession #:    [id number]    Weight:       129.6 lb Date of Birth:  Jan 25, 1925    BSA:          1.663 m Patient Age:    19 years      BP:           156/69 mmHg Patient Gender: F             HR:           62 bpm. Exam Location:  Inpatient Procedure: 2D Echo, Cardiac Doppler and Color Doppler Indications:    Acute right side body wewakness  History:        Patient has prior history of Echocardiogram examinations, most                 recent 08/31/2020. COPD and Stroke; Risk Factors:Dyslipidemia,                 Hypertension, Former Smoker  and Diabetes. 06/24/2014 TEE with                 negative bubble study.  Sonographer:    Luisa Hart RDCS Referring Phys: [place]  1. Left ventricular ejection fraction, by estimation, is 40 to 45%. Left ventricular ejection fraction by 2D MOD biplane is 42.9 %. The left ventricle has mildly decreased function. The left ventricle has no regional wall motion abnormalities. There is mild left ventricular hypertrophy. Left ventricular diastolic parameters are consistent with Grade I diastolic dysfunction (impaired relaxation). Elevated left ventricular end-diastolic pressure.  2. Right ventricular systolic function is hyperdynamic. The right ventricular size is normal. There is normal pulmonary artery systolic pressure. The estimated right ventricular systolic pressure is 78.2 mmHg.  3. Left atrial size was mildly dilated.   4. The pericardial effusion is posterior to the left ventricle.  5. The mitral valve is abnormal. Trivial mitral valve regurgitation.  6. The aortic valve is tricuspid. Aortic valve regurgitation is not visualized. Moderate aortic valve stenosis. Aortic valve area, by VTI measures 0.98 cm. Aortic valve mean gradient measures 15.7 mmHg. Aortic valve Vmax measures 2.72 m/s. DI is 0.39.  7. The inferior vena cava is normal in size with greater than 50% respiratory variability, suggesting right atrial pressure of 3 mmHg. Comparison(s): No significant change from prior study. 08/31/2020: LVEF 40-45%, moderate AS - mean gradient 19.5 mmHg. FINDINGS  Left Ventricle: Left ventricular ejection fraction, by estimation, is 40 to 45%. Left ventricular ejection fraction by 2D MOD biplane is 42.9 %. The left ventricle has mildly decreased function. The left ventricle has no regional wall motion abnormalities. The left ventricular internal cavity size was normal in size. There is mild left ventricular hypertrophy. Left ventricular diastolic parameters are consistent with Grade I diastolic dysfunction (impaired relaxation). Elevated left ventricular end-diastolic pressure. Right Ventricle: The right ventricular size is normal. No increase in right ventricular wall thickness. Right ventricular systolic function is hyperdynamic. There is normal pulmonary artery systolic pressure. The tricuspid regurgitant velocity is 1.82 m/s, and with an assumed right atrial pressure of 3 mmHg, the estimated right ventricular systolic pressure is 42.3 mmHg. Left Atrium: Left atrial size was mildly dilated. Right Atrium: Right atrial size was normal in size. Pericardium: Trivial pericardial effusion is present. The pericardial effusion is posterior to the left ventricle. Mitral Valve: The mitral valve is abnormal. There is mild thickening of the mitral valve leaflet(s). There is mild calcification of the mitral valve leaflet(s). Mild mitral annular  calcification. Trivial mitral valve regurgitation. MV peak gradient, 6.0 mmHg. The mean mitral valve gradient is 2.0 mmHg. Tricuspid Valve: The tricuspid valve is grossly normal. Tricuspid valve regurgitation is trivial. Aortic Valve: The aortic valve is tricuspid. Aortic valve regurgitation is not visualized. Moderate aortic stenosis is present. Aortic valve mean gradient measures 15.7 mmHg. Aortic valve peak gradient measures 29.6 mmHg. Aortic valve area, by VTI measures 0.98 cm. Pulmonic Valve: The pulmonic valve was normal in structure. Pulmonic valve regurgitation is not visualized. Aorta: The aortic root and ascending aorta are structurally normal, with no evidence of dilitation. Venous: The inferior vena cava is normal in size with greater than 50% respiratory variability, suggesting right atrial pressure of 3 mmHg. IAS/Shunts: No atrial level shunt detected by color flow Doppler.  LEFT VENTRICLE PLAX 2D                        Biplane EF (MOD) LVIDd:  4.40 cm         LV Biplane EF:   Left LVIDs:         3.30 cm                          ventricular LV PW:         1.30 cm                          ejection LV IVS:        1.30 cm                          fraction by LVOT diam:     1.80 cm                          2D MOD LV SV:         57                               biplane is LV SV Index:   34                               42.9 %. LVOT Area:     2.54 cm                                Diastology                                LV e' medial:    2.85 cm/s LV Volumes (MOD)               LV E/e' medial:  21.4 LV vol d, MOD    38.9 ml       LV e' lateral:   3.45 cm/s A2C:                           LV E/e' lateral: 17.7 LV vol d, MOD    61.6 ml A4C: LV vol s, MOD    25.8 ml A2C: LV vol s, MOD    30.4 ml A4C: LV SV MOD A2C:   13.1 ml LV SV MOD A4C:   61.6 ml LV SV MOD BP:    21.1 ml RIGHT VENTRICLE RV Basal diam:  2.70 cm RV Mid diam:    1.70 cm RV S prime:     17.00 cm/s TAPSE (M-mode): 2.4 cm LEFT ATRIUM              Index       RIGHT ATRIUM           Index LA diam:        2.90 cm 1.74 cm/m  RA Area:     13.30 cm LA Vol (A2C):   84.2 ml 50.63 ml/m RA Volume:   27.30 ml  16.42 ml/m LA Vol (A4C):   34.5 ml 20.74 ml/m LA Biplane Vol: 58.4 ml 35.12 ml/m  AORTIC VALVE                    PULMONIC VALVE AV Area (  Vmax):    0.93 cm     PV Vmax:       1.19 m/s AV Area (Vmean):   0.88 cm     PV Vmean:      65.200 cm/s AV Area (VTI):     0.98 cm     PV VTI:        0.209 m AV Vmax:           272.00 cm/s  PV Peak grad:  5.6 mmHg AV Vmean:          181.000 cm/s PV Mean grad:  2.0 mmHg AV VTI:            0.580 m AV Peak Grad:      29.6 mmHg AV Mean Grad:      15.7 mmHg LVOT Vmax:         99.55 cm/s LVOT Vmean:        62.900 cm/s LVOT VTI:          0.224 m LVOT/AV VTI ratio: 0.39  AORTA Ao Root diam: 3.00 cm Ao Asc diam:  2.90 cm MITRAL VALVE                TRICUSPID VALVE MV Area (PHT): 2.62 cm     TR Peak grad:   13.2 mmHg MV Area VTI:   1.56 cm     TR Vmax:        182.00 cm/s MV Peak grad:  6.0 mmHg MV Mean grad:  2.0 mmHg     SHUNTS MV Vmax:       1.22 m/s     Systemic VTI:  0.22 m MV Vmean:      62.0 cm/s    Systemic Diam: 1.80 cm MV Decel Time: 290 msec MV E velocity: 60.90 cm/s MV A velocity: 109.00 cm/s MV E/A ratio:  0.56 Lyman Bishop MD Electronically signed by Lyman Bishop MD Signature Date/Time: 12/25/2020/1:23:45 PM    Final       Labs: BNP (last 3 results) Recent Labs    11/17/20 0909 11/21/20 1911 11/29/20 1224  BNP 392.3* 435.1* 270.3*   Basic Metabolic Panel: Recent Labs  Lab 12/23/20 1942 12/25/20 0432 12/27/20 0127  NA 139 139 137  K 3.8 3.6 3.7  CL 102 104 102  CO2 26 28 27   GLUCOSE 168* 91 115*  BUN 22 19 30*  CREATININE 1.27* 1.17* 1.39*  CALCIUM 9.0 8.6* 8.4*  MG  --  2.1 2.1  PHOS  --  3.6 3.8   Liver Function Tests: Recent Labs  Lab 12/23/20 1942  AST 19  ALT 15  ALKPHOS 93  BILITOT 0.6  PROT 6.4*  ALBUMIN 3.9   No results for input(s): LIPASE, AMYLASE in the  last 168 hours. No results for input(s): AMMONIA in the last 168 hours. CBC: Recent Labs  Lab 12/23/20 1942 12/25/20 0432 12/27/20 0127  WBC 6.1 5.5 4.1  NEUTROABS 4.7  --   --   HGB 12.6 11.2* 10.4*  HCT 39.0 34.8* 32.4*  MCV 94.7 95.9 96.1  PLT 147* 123* 115*   Cardiac Enzymes: No results for input(s): CKTOTAL, CKMB, CKMBINDEX, TROPONINI in the last 168 hours. BNP: Invalid input(s): POCBNP CBG: Recent Labs  Lab 12/25/20 1713 12/25/20 2001 12/26/20 0727 12/26/20 1135 12/26/20 1624  GLUCAP 119* 129* 85 95 120*   D-Dimer No results for input(s): DDIMER in the last 72 hours. Hgb A1c Recent Labs    12/25/20 0432  HGBA1C 5.8*  Lipid Profile Recent Labs    12/25/20 0432  CHOL 159  HDL 74  LDLCALC 74  TRIG 56  CHOLHDL 2.1   Thyroid function studies No results for input(s): TSH, T4TOTAL, T3FREE, THYROIDAB in the last 72 hours.  Invalid input(s): FREET3 Anemia work up No results for input(s): VITAMINB12, FOLATE, FERRITIN, TIBC, IRON, RETICCTPCT in the last 72 hours. Urinalysis    Component Value Date/Time   COLORURINE COLORLESS (A) 12/23/2020 1942   APPEARANCEUR CLEAR 12/23/2020 1942   LABSPEC 1.011 12/23/2020 1942   PHURINE 7.5 12/23/2020 1942   GLUCOSEU NEGATIVE 12/23/2020 1942   GLUCOSEU NEGATIVE 12/01/2019 [place] 12/23/2020 1942   BILIRUBINUR NEGATIVE 12/23/2020 1942   BILIRUBINUR n 08/17/2012 1455   KETONESUR NEGATIVE 12/23/2020 1942   PROTEINUR NEGATIVE 12/23/2020 1942   UROBILINOGEN 0.2 12/01/2019 1028   NITRITE NEGATIVE 12/23/2020 1942   LEUKOCYTESUR NEGATIVE 12/23/2020 1942   Sepsis Labs Invalid input(s): PROCALCITONIN,  WBC,  LACTICIDVEN Microbiology Recent Results (from the past 240 hour(s))  Resp Panel by RT-PCR (Flu A&B, Covid) Nasopharyngeal Swab     Status: None   Collection Time: 12/23/20  7:42 PM   Specimen: Nasopharyngeal Swab; Nasopharyngeal(NP) swabs in vial transport medium  Result Value Ref Range Status    SARS Coronavirus 2 by RT PCR NEGATIVE NEGATIVE Final    Comment: (NOTE) SARS-CoV-2 target nucleic acids are NOT DETECTED.  The SARS-CoV-2 RNA is generally detectable in upper respiratory specimens during the acute phase of infection. The lowest concentration of SARS-CoV-2 viral copies this assay can detect is 138 copies/mL. A negative result does not preclude SARS-Cov-2 infection and should not be used as the sole basis for treatment or other patient management decisions. A negative result may occur with  improper specimen collection/handling, submission of specimen other than nasopharyngeal swab, presence of viral mutation(s) within the areas targeted by this assay, and inadequate number of viral copies(<138 copies/mL). A negative result must be combined with clinical observations, patient history, and epidemiological information. The expected result is Negative.  Fact Sheet for Patients:  EntrepreneurPulse.com.au  Fact Sheet for Healthcare Providers:  IncredibleEmployment.be  This test is no t yet approved or cleared by the [place] FDA and  has been authorized for detection and/or diagnosis of SARS-CoV-2 by FDA under an Emergency Use Authorization (EUA). This EUA will remain  in effect (meaning this test can be used) for the duration of the COVID-19 declaration under Section 564(b)(1) of the Act, 21 U.S.C.section 360bbb-3(b)(1), unless the authorization is terminated  or revoked sooner.       Influenza A by PCR NEGATIVE NEGATIVE Final   Influenza B by PCR NEGATIVE NEGATIVE Final    Comment: (NOTE) The Xpert Xpress SARS-CoV-2/FLU/RSV plus assay is intended as an aid in the diagnosis of influenza from Nasopharyngeal swab specimens and should not be used as a sole basis for treatment. Nasal washings and aspirates are unacceptable for Xpert Xpress SARS-CoV-2/FLU/RSV testing.  Fact Sheet for  Patients: EntrepreneurPulse.com.au  Fact Sheet for Healthcare Providers: IncredibleEmployment.be  This test is not yet approved or cleared by the [place] FDA and has been authorized for detection and/or diagnosis of SARS-CoV-2 by FDA under an Emergency Use Authorization (EUA). This EUA will remain in effect (meaning this test can be used) for the duration of the COVID-19 declaration under Section 564(b)(1) of the Act, 21 U.S.C. section 360bbb-3(b)(1), unless the authorization is terminated or revoked.  Performed at [facility], [address] [place], [place] [address]  Total time spend on discharging this patient, including the last patient exam, discussing the hospital stay, instructions for ongoing care as it relates to all pertinent caregivers, as well as preparing the medical discharge records, prescriptions, and/or referrals as applicable, is 60 minutes.    Enzo Bi, MD  [facility] 12/27/2020, 12:47 PM

## 2020-12-27 NOTE — Progress Notes (Signed)
EKG department should place Zio patch today prior to discharge.

## 2020-12-27 NOTE — Progress Notes (Signed)
Physical Therapy Treatment Patient Details Name: [name] MRN: [id number] DOB: April 16, 1925 Today's Date: 12/27/2020    History of Present Illness 87 y.o. female presented to ED on 12/23/2020 due to right-sided weakness of 2 days duration. 12/25/20 MRI acute ischemia in both frontal lobes and left  occipital lobe.  PMH significant for prior CVA in 2015, former tobacco user, quit 4 years ago, COPD, CKD 3B, chronic anxiety, hypertension, hyperlipidemia, hypothyroidism, GERD,    PT Comments    Daughter present and concerned that pt is refusing SNF and family cannot provide 24/7 supervision. Patient demonstrated multiple higher level activities using RW and did all very well and safely (see below for details). By end of session, I was convinced pt could return home with the intermittent supervision family has been providing up until now. (Someone is there for 3 meals a day and each evening until pt goes to bed). MD and Case Manager/TOC team made aware.     Follow Up Recommendations  Home health PT;Supervision - Intermittent (pt refusing SNF; family is present for all 3 meals and each evening until pt goes to bed)     Equipment Recommendations  None recommended by PT    Recommendations for Other Services OT consult     Precautions / Restrictions Precautions Precautions: Fall Restrictions Weight Bearing Restrictions: No    Mobility  Bed Mobility               General bed mobility comments: pt received in recliner, and left up in recliner at end of session    Transfers Overall transfer level: Needs assistance Equipment used: Rolling walker (2 wheeled) Transfers: Sit to/from Stand Sit to Stand: Supervision         General transfer comment: supervision for cues to reach back to chair x 3 reps "I feel the chair wiht the back of my legs, I know it is there and not going to move."  Ambulation/Gait Ambulation/Gait assistance: Modified independent (Device/Increase time) Gait  Distance (Feet): 160 Feet (plus 50 feet in room around obstacles) Assistive device: Rolling walker (2 wheeled) Gait Pattern/deviations: Step-through pattern;Decreased stride length;Shuffle;Trunk flexed   Gait velocity interpretation: 1.31 - 2.62 ft/sec, indicative of limited community ambulator General Gait Details: tends to push RW slightly too far ahead; when cued for proximity "everyone tells me that!" and daughter confirms; otherwise pt demonstrated ideal, safe use of RW including narrow spaces and turning sideways (without cues), opening door, moving from walker to counter to do her LE exercises   Stairs             Wheelchair Mobility    Modified Rankin (Stroke Patients Only)       Balance Overall balance assessment: Needs assistance Sitting-balance support: No upper extremity supported;Feet supported Sitting balance-Leahy Scale: Fair     Standing balance support: No upper extremity supported;During functional activity Standing balance-Leahy Scale: Fair Standing balance comment: standing at sink for ADLs with no UE support                            Cognition Arousal/Alertness: Awake/alert Behavior During Therapy: WFL for tasks assessed/performed Overall Cognitive Status: Within Functional Limits for tasks assessed                                 General Comments: demonstrated excellent safety awareness with RW and in/out of small spaces, up to  counter, putting it aside to do her leg exercises, etc      Exercises General Exercises - Lower Extremity Hip ABduction/ADduction: AROM;Strengthening;Both;10 reps;Standing Hip Flexion/Marching: AROM;Both;5 reps;Standing Mini-Sqauts: AROM;Both;5 reps;Standing Other Exercises Other Exercises: standing hip extension x 5 reps each leg (pt demonstrating safety with her HEP given by HHPT)    General Comments General comments (skin integrity, edema, etc.): Daughter, [name], present and not in agreement  wiht pt's refusal to go to SNF. By end of session, she was OK with plan for pt to go home with HHPT and intermittent supervision as pt did very well.      Pertinent Vitals/Pain Pain Assessment: No/denies pain    Home Living Family/patient expects to be discharged to:: Private residence Living Arrangements: Alone Available Help at Discharge: Family;Personal care attendant;Available PRN/intermittently Type of Home: House Home Access: Ramped entrance   Home Layout: Multi-level Home Equipment: Shower seat;Walker - 2 wheels;Walker - 4 wheels;Transport chair;Cane - single point;Grab bars - tub/shower;Other (comment) (stair lifts and RWs on every level) Additional Comments: Aide comes 1x/week for ~30 minutes to assist with shower; family/aid does cooking/cleaning    Prior Function Level of Independence: Needs assistance  Gait / Transfers Assistance Needed: Very short distance ambulation with RW. Uses transport chair when out of home. ADL's / Homemaking Assistance Needed: Family does meal prep. Aide assists with bathing 1x/wk; pt dresses herself Comments: has housekeeper every two weeks to help clean, cooks, drives, gets groceries   PT Goals (current goals can now be found in the care plan section) Acute Rehab PT Goals Patient Stated Goal: to return home with [facility] Time For Goal Achievement: 01/08/21 Potential to Achieve Goals: Good Progress towards PT goals: Progressing toward goals    Frequency    Min 3X/week      PT Plan Discharge plan needs to be updated    Co-evaluation              AM-PAC PT "6 Clicks" Mobility   Outcome Measure  Help needed turning from your back to your side while in a flat bed without using bedrails?: A Little Help needed moving from lying on your back to sitting on the side of a flat bed without using bedrails?: A Little Help needed moving to and from a bed to a chair (including a wheelchair)?: A Little Help needed standing up from a chair using  your arms (e.g., wheelchair or bedside chair)?: A Little Help needed to walk in hospital room?: None Help needed climbing 3-5 steps with a railing? : A Lot 6 Click Score: 18    End of Session Equipment Utilized During Treatment: Gait belt Activity Tolerance: Patient tolerated treatment well Patient left: with call bell/phone within reach;in chair;with chair alarm set;with family/visitor present Nurse Communication: Mobility status PT Visit Diagnosis: Unsteadiness on feet (R26.81);Muscle weakness (generalized) (M62.81)     Time: 7902-4097 PT Time Calculation (min) (ACUTE ONLY): 37 min  Charges:  $Gait Training: 23-37 mins                      Arby Barrette, PT Pager [phone]    Rexanne Mano 12/27/2020, 11:22 AM

## 2020-12-28 ENCOUNTER — Ambulatory Visit (INDEPENDENT_AMBULATORY_CARE_PROVIDER_SITE_OTHER): Payer: Medicare PPO

## 2020-12-28 ENCOUNTER — Other Ambulatory Visit: Payer: Self-pay | Admitting: Physician Assistant

## 2020-12-28 ENCOUNTER — Telehealth: Payer: Self-pay

## 2020-12-28 ENCOUNTER — Telehealth: Payer: Self-pay | Admitting: Pulmonary Disease

## 2020-12-28 DIAGNOSIS — I4891 Unspecified atrial fibrillation: Secondary | ICD-10-CM

## 2020-12-28 DIAGNOSIS — I639 Cerebral infarction, unspecified: Secondary | ICD-10-CM

## 2020-12-28 DIAGNOSIS — I6523 Occlusion and stenosis of bilateral carotid arteries: Secondary | ICD-10-CM

## 2020-12-28 NOTE — Telephone Encounter (Signed)
Lm for patient's daughter, [name].

## 2020-12-28 NOTE — Telephone Encounter (Signed)
Transition Care Management Follow-up Telephone Call Date of discharge and from where: 12/27/2020 How have you been since you were released from the hospital? well Any questions or concerns? No  Items Reviewed: Did the pt receive and understand the discharge instructions provided? Yes  Medications obtained and verified? Yes  Other? No  Any new allergies since your discharge? No  Dietary orders reviewed? Yes Do you have support at home? Yes   Home Care and Equipment/Supplies: Were home health services ordered? yes If so, what is the name of the agency? Daughter is waiting for [facility] to reinstate the [facility]   Has the agency set up a time to come to the patient's home? no Were any new equipment or medical supplies ordered?  No What is the name of the medical supply agency? N/a Were you able to get the supplies/equipment? not applicable Do you have any questions related to the use of the equipment or supplies? No  Functional Questionnaire: (I = Independent and D = Dependent) ADLs: d  Bathing/Dressing- D  Meal Prep- D  Eating- I  Maintaining continence- D  Transferring/Ambulation- D  Managing Meds- D  Follow up appointments reviewed:  PCP Hospital f/u appt confirmed? Yes  Scheduled to see [name] on 01/23/2021 @ 100pm. [facility] f/u appt confirmed? Yes  Scheduled to see [name] 01/12/2021  for placement heart monitor for 14 days  Are transportation arrangements needed? No  If their condition worsens, is the pt aware to call PCP or go to the Emergency Dept.? Yes Was the patient provided with contact information for the PCP's office or ED? Yes Was to pt encouraged to call back with questions or concerns? Yes

## 2020-12-28 NOTE — Telephone Encounter (Signed)
error 

## 2020-12-28 NOTE — Telephone Encounter (Signed)
Pt's daughter calling back about refill on prednisone. Pt's daughter can be reached at [phone]

## 2020-12-28 NOTE — Progress Notes (Unsigned)
Patient enrolled for [employer] to mail a 13 day ZIO AT Long Term Monitor-Live Telemetry to address on file.

## 2020-12-29 DIAGNOSIS — D631 Anemia in chronic kidney disease: Secondary | ICD-10-CM | POA: Diagnosis not present

## 2020-12-29 DIAGNOSIS — E876 Hypokalemia: Secondary | ICD-10-CM | POA: Diagnosis not present

## 2020-12-29 DIAGNOSIS — I447 Left bundle-branch block, unspecified: Secondary | ICD-10-CM | POA: Diagnosis not present

## 2020-12-29 DIAGNOSIS — E782 Mixed hyperlipidemia: Secondary | ICD-10-CM | POA: Diagnosis not present

## 2020-12-29 DIAGNOSIS — N183 Chronic kidney disease, stage 3 unspecified: Secondary | ICD-10-CM | POA: Diagnosis not present

## 2020-12-29 DIAGNOSIS — I5022 Chronic systolic (congestive) heart failure: Secondary | ICD-10-CM | POA: Diagnosis not present

## 2020-12-29 DIAGNOSIS — I671 Cerebral aneurysm, nonruptured: Secondary | ICD-10-CM | POA: Diagnosis not present

## 2020-12-29 DIAGNOSIS — I700 Atherosclerosis of aorta: Secondary | ICD-10-CM | POA: Diagnosis not present

## 2020-12-29 DIAGNOSIS — I129 Hypertensive chronic kidney disease with stage 1 through stage 4 chronic kidney disease, or unspecified chronic kidney disease: Secondary | ICD-10-CM | POA: Diagnosis not present

## 2020-12-29 MED ORDER — PREDNISONE 10 MG PO TABS: 10.0000 mg | ORAL_TABLET | Freq: Every day | ORAL | 1 refills | Status: DC

## 2020-12-29 NOTE — Telephone Encounter (Signed)
Called and spoke with [name] and he stated that before she was taking the 2.5 to 10 mg daily depending on her symptoms.  He stated that they stopped her from cutting the pills into 1/4 to do this.  So she is now taking the 10 mg daily.  Rx has been sent in and nothing further is needed.

## 2020-12-31 DIAGNOSIS — E782 Mixed hyperlipidemia: Secondary | ICD-10-CM | POA: Diagnosis not present

## 2020-12-31 DIAGNOSIS — I471 Supraventricular tachycardia: Secondary | ICD-10-CM

## 2020-12-31 DIAGNOSIS — I639 Cerebral infarction, unspecified: Secondary | ICD-10-CM | POA: Diagnosis not present

## 2020-12-31 DIAGNOSIS — I6523 Occlusion and stenosis of bilateral carotid arteries: Secondary | ICD-10-CM

## 2020-12-31 DIAGNOSIS — E876 Hypokalemia: Secondary | ICD-10-CM | POA: Diagnosis not present

## 2020-12-31 DIAGNOSIS — D631 Anemia in chronic kidney disease: Secondary | ICD-10-CM | POA: Diagnosis not present

## 2020-12-31 DIAGNOSIS — N183 Chronic kidney disease, stage 3 unspecified: Secondary | ICD-10-CM | POA: Diagnosis not present

## 2020-12-31 DIAGNOSIS — I5022 Chronic systolic (congestive) heart failure: Secondary | ICD-10-CM | POA: Diagnosis not present

## 2020-12-31 DIAGNOSIS — I4891 Unspecified atrial fibrillation: Secondary | ICD-10-CM

## 2020-12-31 DIAGNOSIS — I129 Hypertensive chronic kidney disease with stage 1 through stage 4 chronic kidney disease, or unspecified chronic kidney disease: Secondary | ICD-10-CM | POA: Diagnosis not present

## 2020-12-31 DIAGNOSIS — I671 Cerebral aneurysm, nonruptured: Secondary | ICD-10-CM | POA: Diagnosis not present

## 2020-12-31 DIAGNOSIS — I700 Atherosclerosis of aorta: Secondary | ICD-10-CM | POA: Diagnosis not present

## 2020-12-31 DIAGNOSIS — I447 Left bundle-branch block, unspecified: Secondary | ICD-10-CM | POA: Diagnosis not present

## 2021-01-01 ENCOUNTER — Telehealth: Payer: Self-pay | Admitting: Adult Health

## 2021-01-01 DIAGNOSIS — D631 Anemia in chronic kidney disease: Secondary | ICD-10-CM | POA: Diagnosis not present

## 2021-01-01 DIAGNOSIS — I639 Cerebral infarction, unspecified: Secondary | ICD-10-CM | POA: Diagnosis not present

## 2021-01-01 DIAGNOSIS — I4891 Unspecified atrial fibrillation: Secondary | ICD-10-CM | POA: Diagnosis not present

## 2021-01-01 DIAGNOSIS — N183 Chronic kidney disease, stage 3 unspecified: Secondary | ICD-10-CM | POA: Diagnosis not present

## 2021-01-01 DIAGNOSIS — I129 Hypertensive chronic kidney disease with stage 1 through stage 4 chronic kidney disease, or unspecified chronic kidney disease: Secondary | ICD-10-CM | POA: Diagnosis not present

## 2021-01-01 DIAGNOSIS — I671 Cerebral aneurysm, nonruptured: Secondary | ICD-10-CM | POA: Diagnosis not present

## 2021-01-01 DIAGNOSIS — I5022 Chronic systolic (congestive) heart failure: Secondary | ICD-10-CM | POA: Diagnosis not present

## 2021-01-01 DIAGNOSIS — E876 Hypokalemia: Secondary | ICD-10-CM | POA: Diagnosis not present

## 2021-01-01 DIAGNOSIS — I447 Left bundle-branch block, unspecified: Secondary | ICD-10-CM | POA: Diagnosis not present

## 2021-01-01 DIAGNOSIS — I6523 Occlusion and stenosis of bilateral carotid arteries: Secondary | ICD-10-CM | POA: Diagnosis not present

## 2021-01-01 DIAGNOSIS — E782 Mixed hyperlipidemia: Secondary | ICD-10-CM | POA: Diagnosis not present

## 2021-01-01 DIAGNOSIS — I700 Atherosclerosis of aorta: Secondary | ICD-10-CM | POA: Diagnosis not present

## 2021-01-01 NOTE — Telephone Encounter (Signed)
[name] w/[facility] is calling in for verbal orders skill nursing for 1 week 4 and 1 PRN to continue to do what pt was doing before she went into the hospital. Pt got out of the hospital on 12/27/2020 from having another stroke and pt was seen by [place] on 12/31/2020 and assisted the family with putting on the heart monitor.  May leave a detail msg on the secured line.

## 2021-01-02 DIAGNOSIS — I447 Left bundle-branch block, unspecified: Secondary | ICD-10-CM | POA: Diagnosis not present

## 2021-01-02 DIAGNOSIS — I5022 Chronic systolic (congestive) heart failure: Secondary | ICD-10-CM | POA: Diagnosis not present

## 2021-01-02 DIAGNOSIS — H353113 Nonexudative age-related macular degeneration, right eye, advanced atrophic without subfoveal involvement: Secondary | ICD-10-CM | POA: Diagnosis not present

## 2021-01-02 DIAGNOSIS — E876 Hypokalemia: Secondary | ICD-10-CM | POA: Diagnosis not present

## 2021-01-02 DIAGNOSIS — N183 Chronic kidney disease, stage 3 unspecified: Secondary | ICD-10-CM | POA: Diagnosis not present

## 2021-01-02 DIAGNOSIS — I129 Hypertensive chronic kidney disease with stage 1 through stage 4 chronic kidney disease, or unspecified chronic kidney disease: Secondary | ICD-10-CM | POA: Diagnosis not present

## 2021-01-02 DIAGNOSIS — I700 Atherosclerosis of aorta: Secondary | ICD-10-CM | POA: Diagnosis not present

## 2021-01-02 DIAGNOSIS — H26492 Other secondary cataract, left eye: Secondary | ICD-10-CM | POA: Diagnosis not present

## 2021-01-02 DIAGNOSIS — D631 Anemia in chronic kidney disease: Secondary | ICD-10-CM | POA: Diagnosis not present

## 2021-01-02 DIAGNOSIS — E782 Mixed hyperlipidemia: Secondary | ICD-10-CM | POA: Diagnosis not present

## 2021-01-02 DIAGNOSIS — H353221 Exudative age-related macular degeneration, left eye, with active choroidal neovascularization: Secondary | ICD-10-CM | POA: Diagnosis not present

## 2021-01-02 DIAGNOSIS — I671 Cerebral aneurysm, nonruptured: Secondary | ICD-10-CM | POA: Diagnosis not present

## 2021-01-02 DIAGNOSIS — H35033 Hypertensive retinopathy, bilateral: Secondary | ICD-10-CM | POA: Diagnosis not present

## 2021-01-02 NOTE — Telephone Encounter (Signed)
Okay for verbal orders? Please advise 

## 2021-01-03 NOTE — Telephone Encounter (Signed)
Left message for [name] to return phone call.

## 2021-01-04 ENCOUNTER — Telehealth: Payer: Self-pay

## 2021-01-04 NOTE — Telephone Encounter (Signed)
[facility] calling for PT 2W1 1W3 to bring pt into end of episode. Ok'd per [facility] verbally. No further actions needed.

## 2021-01-04 NOTE — Telephone Encounter (Signed)
Verbal orders given to Tonya 

## 2021-01-05 DIAGNOSIS — S90112A Contusion of left great toe without damage to nail, initial encounter: Secondary | ICD-10-CM | POA: Diagnosis not present

## 2021-01-05 DIAGNOSIS — W010XXA Fall on same level from slipping, tripping and stumbling without subsequent striking against object, initial encounter: Secondary | ICD-10-CM | POA: Diagnosis not present

## 2021-01-08 DIAGNOSIS — I5022 Chronic systolic (congestive) heart failure: Secondary | ICD-10-CM | POA: Diagnosis not present

## 2021-01-08 DIAGNOSIS — D631 Anemia in chronic kidney disease: Secondary | ICD-10-CM | POA: Diagnosis not present

## 2021-01-08 DIAGNOSIS — E782 Mixed hyperlipidemia: Secondary | ICD-10-CM | POA: Diagnosis not present

## 2021-01-08 DIAGNOSIS — I447 Left bundle-branch block, unspecified: Secondary | ICD-10-CM | POA: Diagnosis not present

## 2021-01-08 DIAGNOSIS — I700 Atherosclerosis of aorta: Secondary | ICD-10-CM | POA: Diagnosis not present

## 2021-01-08 DIAGNOSIS — I129 Hypertensive chronic kidney disease with stage 1 through stage 4 chronic kidney disease, or unspecified chronic kidney disease: Secondary | ICD-10-CM | POA: Diagnosis not present

## 2021-01-08 DIAGNOSIS — E876 Hypokalemia: Secondary | ICD-10-CM | POA: Diagnosis not present

## 2021-01-08 DIAGNOSIS — N183 Chronic kidney disease, stage 3 unspecified: Secondary | ICD-10-CM | POA: Diagnosis not present

## 2021-01-08 DIAGNOSIS — I671 Cerebral aneurysm, nonruptured: Secondary | ICD-10-CM | POA: Diagnosis not present

## 2021-01-10 ENCOUNTER — Telehealth: Payer: Self-pay | Admitting: Pulmonary Disease

## 2021-01-10 ENCOUNTER — Other Ambulatory Visit: Payer: Self-pay | Admitting: Pulmonary Disease

## 2021-01-10 DIAGNOSIS — N183 Chronic kidney disease, stage 3 unspecified: Secondary | ICD-10-CM | POA: Diagnosis not present

## 2021-01-10 DIAGNOSIS — D631 Anemia in chronic kidney disease: Secondary | ICD-10-CM | POA: Diagnosis not present

## 2021-01-10 DIAGNOSIS — I447 Left bundle-branch block, unspecified: Secondary | ICD-10-CM | POA: Diagnosis not present

## 2021-01-10 DIAGNOSIS — I671 Cerebral aneurysm, nonruptured: Secondary | ICD-10-CM | POA: Diagnosis not present

## 2021-01-10 DIAGNOSIS — I129 Hypertensive chronic kidney disease with stage 1 through stage 4 chronic kidney disease, or unspecified chronic kidney disease: Secondary | ICD-10-CM | POA: Diagnosis not present

## 2021-01-10 DIAGNOSIS — E782 Mixed hyperlipidemia: Secondary | ICD-10-CM | POA: Diagnosis not present

## 2021-01-10 DIAGNOSIS — I5022 Chronic systolic (congestive) heart failure: Secondary | ICD-10-CM | POA: Diagnosis not present

## 2021-01-10 DIAGNOSIS — E876 Hypokalemia: Secondary | ICD-10-CM | POA: Diagnosis not present

## 2021-01-10 DIAGNOSIS — I700 Atherosclerosis of aorta: Secondary | ICD-10-CM | POA: Diagnosis not present

## 2021-01-10 MED ORDER — TRELEGY ELLIPTA 100-62.5-25 MCG/INH IN AEPB: INHALATION_SPRAY | RESPIRATORY_TRACT | 3 refills | Status: DC

## 2021-01-10 NOTE — Telephone Encounter (Signed)
Call made to patient, daughter [name] answered phone (DPR), confirmed patient DOB. Confirmed medication and pharmacy. Refill sent in. Patient daughter wanted to know if we could refill prednisone for 90 day supply moving forward. I made her aware we could. She also states her mother recently had a stroke and has been home about a week and now has home health. I made her aware I would be sure to update her provider. Voiced understanding. Patient aware her provider is not in clinic and will not be for a few days. Patient has enough prednisone for 2 months.   PM patient wanted to update you, letting you know she recently had a stroke and also requesting a 90 day supply for her prednisone. She just had a refill sent in 6/24. Thanks :)

## 2021-01-12 ENCOUNTER — Ambulatory Visit (HOSPITAL_BASED_OUTPATIENT_CLINIC_OR_DEPARTMENT_OTHER): Payer: Medicare PPO | Admitting: Family

## 2021-01-12 DIAGNOSIS — I700 Atherosclerosis of aorta: Secondary | ICD-10-CM | POA: Diagnosis not present

## 2021-01-12 DIAGNOSIS — E782 Mixed hyperlipidemia: Secondary | ICD-10-CM | POA: Diagnosis not present

## 2021-01-12 DIAGNOSIS — I447 Left bundle-branch block, unspecified: Secondary | ICD-10-CM | POA: Diagnosis not present

## 2021-01-12 DIAGNOSIS — I5022 Chronic systolic (congestive) heart failure: Secondary | ICD-10-CM | POA: Diagnosis not present

## 2021-01-12 DIAGNOSIS — E876 Hypokalemia: Secondary | ICD-10-CM | POA: Diagnosis not present

## 2021-01-12 DIAGNOSIS — I129 Hypertensive chronic kidney disease with stage 1 through stage 4 chronic kidney disease, or unspecified chronic kidney disease: Secondary | ICD-10-CM | POA: Diagnosis not present

## 2021-01-12 DIAGNOSIS — I671 Cerebral aneurysm, nonruptured: Secondary | ICD-10-CM | POA: Diagnosis not present

## 2021-01-12 DIAGNOSIS — D631 Anemia in chronic kidney disease: Secondary | ICD-10-CM | POA: Diagnosis not present

## 2021-01-12 DIAGNOSIS — N183 Chronic kidney disease, stage 3 unspecified: Secondary | ICD-10-CM | POA: Diagnosis not present

## 2021-01-15 DIAGNOSIS — I5022 Chronic systolic (congestive) heart failure: Secondary | ICD-10-CM | POA: Diagnosis not present

## 2021-01-15 DIAGNOSIS — I447 Left bundle-branch block, unspecified: Secondary | ICD-10-CM | POA: Diagnosis not present

## 2021-01-15 DIAGNOSIS — D631 Anemia in chronic kidney disease: Secondary | ICD-10-CM | POA: Diagnosis not present

## 2021-01-15 DIAGNOSIS — N183 Chronic kidney disease, stage 3 unspecified: Secondary | ICD-10-CM | POA: Diagnosis not present

## 2021-01-15 DIAGNOSIS — E876 Hypokalemia: Secondary | ICD-10-CM | POA: Diagnosis not present

## 2021-01-15 DIAGNOSIS — I671 Cerebral aneurysm, nonruptured: Secondary | ICD-10-CM | POA: Diagnosis not present

## 2021-01-15 DIAGNOSIS — I700 Atherosclerosis of aorta: Secondary | ICD-10-CM | POA: Diagnosis not present

## 2021-01-15 DIAGNOSIS — E782 Mixed hyperlipidemia: Secondary | ICD-10-CM | POA: Diagnosis not present

## 2021-01-15 DIAGNOSIS — I129 Hypertensive chronic kidney disease with stage 1 through stage 4 chronic kidney disease, or unspecified chronic kidney disease: Secondary | ICD-10-CM | POA: Diagnosis not present

## 2021-01-16 MED ORDER — PREDNISONE 10 MG PO TABS: 10.0000 mg | ORAL_TABLET | Freq: Every day | ORAL | 1 refills | Status: DC

## 2021-01-16 NOTE — Telephone Encounter (Signed)
Thanks for the update. Ok to give 90 day refills on the prednisone

## 2021-01-16 NOTE — Telephone Encounter (Signed)
Called and spoke with [name] (DPR).  [name] aware that Prednisone 90 days sent to requested pharmacy.  Understanding stated.  Nothing further at this time.

## 2021-01-18 DIAGNOSIS — E782 Mixed hyperlipidemia: Secondary | ICD-10-CM | POA: Diagnosis not present

## 2021-01-18 DIAGNOSIS — I5022 Chronic systolic (congestive) heart failure: Secondary | ICD-10-CM | POA: Diagnosis not present

## 2021-01-18 DIAGNOSIS — D631 Anemia in chronic kidney disease: Secondary | ICD-10-CM | POA: Diagnosis not present

## 2021-01-18 DIAGNOSIS — I671 Cerebral aneurysm, nonruptured: Secondary | ICD-10-CM | POA: Diagnosis not present

## 2021-01-18 DIAGNOSIS — N183 Chronic kidney disease, stage 3 unspecified: Secondary | ICD-10-CM | POA: Diagnosis not present

## 2021-01-18 DIAGNOSIS — I700 Atherosclerosis of aorta: Secondary | ICD-10-CM | POA: Diagnosis not present

## 2021-01-18 DIAGNOSIS — E876 Hypokalemia: Secondary | ICD-10-CM | POA: Diagnosis not present

## 2021-01-18 DIAGNOSIS — I447 Left bundle-branch block, unspecified: Secondary | ICD-10-CM | POA: Diagnosis not present

## 2021-01-18 DIAGNOSIS — I129 Hypertensive chronic kidney disease with stage 1 through stage 4 chronic kidney disease, or unspecified chronic kidney disease: Secondary | ICD-10-CM | POA: Diagnosis not present

## 2021-01-21 NOTE — Progress Notes (Signed)
Office Visit    Patient Name: [name] Date of Encounter: 01/22/2021  PCP:  [name], NP   [place]  Cardiologist:  Skeet Latch, MD  Advanced Practice Provider:  No care team member to display Electrophysiologist:  None    Chief Complaint    [name] is a 87 y.o. female with a hx of labile hypertension, hyperlipidemia, aortic stenosis, hypothyroidism, CVA (2015, 2022), TIA, former tobacco use, CKD IIIb, GERD, hypothyroidism, anxiety, COPD, pulmonary nodule, carotid artery stenosis s/p left carotid endarterectomy, chronic systolic and diastolic heart failure presents today for hospital follow up   Past Medical History    Past Medical History:  Diagnosis Date   Anxiety    Aortic arch atherosclerosis ([place]) 06/24/2014   Atherosclerotic ulcer of aorta ([place]) 06/24/2014   Carotid artery occlusion    Chronic kidney disease    COPD (chronic obstructive pulmonary disease) ([place])    [place] DISEASE, LUMBAR 12/16/2008   DIVERTICULOSIS, COLON 09/30/2008   DYSPNEA 07/13/2008   [place]ves disease    History of embolic stroke 6/71/2458   Left brain   HYPERLIPIDEMIA 03/06/2007   HYPERTENSION 03/06/2007   HYPOTHYROIDISM 10/13/2007   OSTEOARTHRITIS 03/06/2007   Personal history of colonic polyps 09/30/2008   Stroke ([place])    06/2014            TOBACCO USE, QUIT 04/12/2009   WEAKNESS 11/09/2007   Past Surgical History:  Procedure Laterality Date   CARDIAC CATHETERIZATION     CATARACT EXTRACTION Bilateral    CHOLECYSTECTOMY     ENDARTERECTOMY Left 11/13/2015   Procedure: LEFT CAROTID ARTERY ENDARTERECTOMY;  Surgeon: Conrad Rosedale, MD;  [facility];  Service: Vascular;  Laterality: Left;   ESOPHAGOGASTRODUODENOSCOPY (EGD) WITH PROPOFOL N/A 10/11/2016   Procedure: ESOPHAGOGASTRODUODENOSCOPY (EGD) WITH PROPOFOL;  Surgeon: Mauri Pole, MD;  Location: [facility] ENDOSCOPY;  Service: Endoscopy;  Laterality: N/A;   KNEE SURGERY     PATCH ANGIOPLASTY Left 11/13/2015    Procedure: WITH 1CM X 6CM  XENOSURE BIOLOGIC PATCH ANGIOPLASTY;  Surgeon: Conrad McGovern, MD;  Location: [facility];  Service: Vascular;  Laterality: Left;   TEE WITHOUT CARDIOVERSION N/A 06/24/2014   Procedure: TRANSESOPHAGEAL ECHOCARDIOGRAM (TEE);  Surgeon: Sanda Klein, MD;  Location: [facility] ENDOSCOPY;  Service: Cardiovascular;  Laterality: N/A;    Allergies  Allergies  Allergen Reactions   Catapres [Clonidine Hcl] Other (See Comments)    Made pt feel horrible, shaky, weak and nausea   Clonidine Other (See Comments)    Made pt feel horrible, shaky, weak and nausea   Lisinopril Anaphylaxis    Tongue swelling   Labetalol Other (See Comments)    Caused bradycardia and syncope   Labetalol Hcl Other (See Comments)    Caused bradycardia and syncope    History of Present Illness    [name] is a 87 y.o. female with a hx of labile hypertension, hyperlipidemia, aortic stenosis, hypothyroidism, CVA (2015, 2022), TIA, former tobacco use, CKD IIIb, GERD, hypothyroidism, anxiety, COPD, pulmonary nodule, carotid artery stenosis s/p left carotid endarterectomy, chronic systolic and diastolic heart failure  last seen while hospitalized.  She has had labile hypertension with intolerances to Labetolol and Clonidine. She had a low risk myoview in 2017. She had left carotid endarterectomy May 0998 complicated by vocal card paralysis. There was notation of "possible TE from extensive aortic arch atherosclerosis" and elected to take ASA and Plavix instead of anticoagulation. She was subsequently admitted with profound anemia  and required transfusion. She had EGD with large AVM clipped.   She is on chronic steroids due to recurrent COPD exacerbations.   She was hospitalized February 2022 with COPD and CHF exacerbation. Her echocardiogram at the time showed moderate aortic stenosis and EF 40-45%. She was discharged on Imdur, Hydralazine, Lasix 20mg  QD.   She was hospitalized 12/23/20 after presenting with  right hand weakness. She was outside of the tPA window. CT head with no acute changes. CTA head and neck with icnreased size of left ICA ophthalmic segment and aneurysm. MRI with punctate foci of acute ischemia in both frontal lobes and left occipital lobe. Echo 12/25/20 with LVEF 40-45%, no RWMA, mild LVH, gr1DD, hyperdynamic RV function, normal PASP, LA mildly dilated, pericardial effusion posterior to LV, trivial MR, moderate aortic stenosis, trileaflet aortic valve.. She was dischraged with 14 day monitor due to concern for embolic strokes and possible atrial fibrillation. She was discharged with Aspirin and Plavix for 3 weeks then aspirin alone.   Preliminary ZIO report shows predmoninantly NSR (min 46 bpm, avg 66 bpm, max 185 bpm). First degree AV block and bundle branch block/IVCD was present. She had 6913 runs of SVT fastest 5 beats at 185 bpm and longest 18 beats at 102 BPM. She had frequent PAC with 5.7% burden, couplet PAC 3.8% burden, PAC triplet 3.3%, PVC <1% burden.   She presents today for follow up with her daughter. She has PT, OT, RN coming in once per week.  Notes feeling overall well since hospital discharge.  She is pleased with how her strength is improving and notes a sensation in her right hand is slowly improving.  She has a blood pressure cuff at home but does not check routinely.  She does monitor her oxygen regularly at home but does not often make note of the heart rate.  Notes no palpitations.  Reports no lightheadedness, dizziness, near-syncope, syncope.  Does note previous episode of syncope while taking labetalol.  She is understandably hesitant regarding beta-blockers.  We reviewed preliminary report of her monitor, as above.  She does not that she stopped with aspirin and Plavix which her daughter was unaware of.  We discussed the importance of maintaining aspirin due to history of CVA.  EKGs/Labs/Other Studies Reviewed:   The following studies were reviewed today:  Echo  12/25/20  1. Left ventricular ejection fraction, by estimation, is 40 to 45%. Left  ventricular ejection fraction by 2D MOD biplane is 42.9 %. The left  ventricle has mildly decreased function. The left ventricle has no  regional wall motion abnormalities. There is  mild left ventricular hypertrophy. Left ventricular diastolic parameters  are consistent with Grade I diastolic dysfunction (impaired relaxation).  Elevated left ventricular end-diastolic pressure.   2. Right ventricular systolic function is hyperdynamic. The right  ventricular size is normal. There is normal pulmonary artery systolic  pressure. The estimated right ventricular systolic pressure is 91.4 mmHg.   3. Left atrial size was mildly dilated.   4. The pericardial effusion is posterior to the left ventricle.   5. The mitral valve is abnormal. Trivial mitral valve regurgitation.   6. The aortic valve is tricuspid. Aortic valve regurgitation is not  visualized. Moderate aortic valve stenosis. Aortic valve area, by VTI  measures 0.98 cm. Aortic valve mean gradient measures 15.7 mmHg. Aortic  valve Vmax measures 2.72 m/s. DI is 0.39.   7. The inferior vena cava is normal in size with greater than 50%  respiratory variability, suggesting  right atrial pressure of 3 mmHg.   EKG:  EKG is ordered today.  The ekg ordered today demonstrates SR 65 bpm with LVH with repolarization abnormality.  Recent Labs: 08/31/2020: TSH 3.587 11/29/2020: B Natriuretic Peptide 446.3 12/23/2020: ALT 15 12/27/2020: BUN 30; Creatinine, Ser 1.39; Hemoglobin 10.4; Magnesium 2.1; Platelets 115; Potassium 3.7; Sodium 137  Recent Lipid Panel    Component Value Date/Time   CHOL 159 12/25/2020 0432   CHOL 218 (H) 03/24/2019 1539   TRIG 56 12/25/2020 0432   HDL 74 12/25/2020 0432   HDL 111 03/24/2019 1539   CHOLHDL 2.1 12/25/2020 0432   VLDL 11 12/25/2020 0432   LDLCALC 74 12/25/2020 0432   LDLCALC 94 03/24/2019 1539   LDLDIRECT 141.7 05/01/2011 1037    Home Medications   Current Meds  Medication Sig   albuterol (PROAIR HFA) 108 (90 Base) MCG/ACT inhaler Inhale 2 puffs into the lungs every 6 (six) hours as needed for wheezing or shortness of breath.   ALPRAZolam (XANAX) 0.5 MG tablet Take 1 tablet (0.5 mg total) by mouth 2 (two) times daily.   amLODipine (NORVASC) 5 MG tablet Take 1 tablet (5 mg total) by mouth daily.   aspirin EC 81 MG EC tablet Take 1 tablet (81 mg total) by mouth daily. Swallow whole.   atorvastatin (LIPITOR) 80 MG tablet TAKE 1 TABLET BY MOUTH EVERY DAY AT 6PM   doxepin (SINEQUAN) 25 MG capsule Take 25 mg by mouth at bedtime.   Fluticasone-Umeclidin-Vilant (TRELEGY ELLIPTA) 100-62.5-25 MCG/INH AEPB INHALE 1 PUFF BY MOUTH EVERY DAY   furosemide (LASIX) 20 MG tablet Take 1 tablet (20 mg total) by mouth daily.   hydrALAZINE (APRESOLINE) 50 MG tablet Take 1 tablet (50 mg total) by mouth 3 (three) times daily.   ipratropium-albuterol (DUONEB) 0.5-2.5 (3) MG/3ML SOLN Take 3 mLs by nebulization every 6 (six) hours as needed (copd).   isosorbide mononitrate (IMDUR) 30 MG 24 hr tablet Take 1 tablet (30 mg total) by mouth daily.   latanoprost (XALATAN) 0.005 % ophthalmic solution Place 1 drop into both eyes at bedtime.    levothyroxine (SYNTHROID) 75 MCG tablet Take 1 tablet (75 mcg total) by mouth daily before breakfast.   pantoprazole (PROTONIX) 40 MG tablet TAKE 1 TABLET BY MOUTH EVERY DAY   potassium chloride SA (KLOR-CON) 20 MEQ tablet Take 1 tablet (20 mEq total) by mouth daily.   predniSONE (DELTASONE) 10 MG tablet Take 1 tablet (10 mg total) by mouth daily with breakfast.   [DISCONTINUED] Fluticasone-Umeclidin-Vilant (TRELEGY ELLIPTA) 100-62.5-25 MCG/INH AEPB INHALE 1 PUFF BY MOUTH EVERY DAY     Review of Systems     All other systems reviewed and are otherwise negative except as noted above.  Physical Exam    VS:  BP (!) 146/60   Pulse 65   Ht 5\' 6"  (1.676 m)   Wt 127 lb (57.6 kg)   SpO2 95%   BMI 20.50  kg/m  , BMI Body mass index is 20.5 kg/m.  Wt Readings from Last 3 Encounters:  01/22/21 127 lb (57.6 kg)  12/23/20 129 lb 9.6 oz (58.8 kg)  12/22/20 122 lb (55.3 kg)     GEN: Well nourished, well developed, in no acute distress. HEENT: normal. Neck: Supple, no JVD, carotid bruits, or masses. Cardiac: RRR, no murmurs, rubs, or gallops. No clubbing, cyanosis, edema.  Radials/PT 2+ and equal bilaterally.  Respiratory:  Respirations regular and unlabored, clear to auscultation bilaterally. GI: Soft, nontender, nondistended. MS: No deformity or  atrophy. Skin: Warm and dry, no rash. Neuro:  Strength and sensation are intact. Psych: Normal affect.  Assessment & Plan   SVT / PAC -preliminary ZIO monitor report placed for cryptogenic stroke with frequent PAC 5.7% burden, PAC couplet 3.8% burden, PAC triplet 3.3% burden, PVC less than 1%. Noted 6,913 episodes of SVT.  She denies palpitations, lightheadedness, dizziness.  She does have a history of syncope followed labetalol.  We will reach out to Dr. Oval Linsey for further recommendations and to review monitor to ensure no episodes of atrial fibrillation.  As preliminary ZIO report shows no atrial fibrillation, no indication for anticoagulation at this time.  We discussed the possibility of a long-term Linq monitor and she is overall and interested in this.  Labile hypertension - Has not been monitoring blood pressure at home.  Blood pressure today is mildly elevated.  Home monitoring encouraged.  She will report blood pressure consistently greater than 130/80.  Continue present antihypertensive regimen including amlodipine 5 mg daily, Lasix 20 mg daily, hydralazine 50 mg TID, Imdur 30mg  QD.   Carotid disease s/p L CEA in 2017 -Continue to follow with vascular surgery.  Continue aspirin, statin.  HLD -continue atorvastatin 80 mg daily.  Denies myalgias.  COPD - On chronic steroid therapy.  Continue to follow with pulmonology.  History of CVA in  2015 and 2022 -completed 3-week course of Plavix.  She also stopped aspirin 81 mg daily and encouraged to resume.  Additional GDMT includes atorvastatin.  Reports her right hand numbness is improving with home health PT, OT, RN.    Aortic stenosis -echo 12/25/2020 with moderate aortic valve stenosis with valve area of 0.98 cm, mean gradient 15.7 mmHg, V-max 2.72 m/s.  Continue optimal blood pressure and heart rate control.  Consider repeat echocardiogram in 1 year for monitoring.  Chronic combined systolic and diastolic heart failure -echocardiogram 12/25/2020 with LVEF 40 to 25%. Euvolemic and well compensated on exam.  GDMT includes Lasix, hydralazine, Imdur.  History of intolerance to lisinopril.  Will defer ARB/MRA/ARNI due to CKD.  Low-salt diet and fluid restriction less than 2 L encouraged.  CKD - Careful titration of diuretic and antihypertensive.    Disposition: Follow up in 3 month(s) with Dr. Oval Linsey or APP.  Signed, Loel Dubonnet, NP 01/22/2021, 11:16 AM [place]

## 2021-01-22 ENCOUNTER — Other Ambulatory Visit: Payer: Self-pay

## 2021-01-22 ENCOUNTER — Ambulatory Visit (INDEPENDENT_AMBULATORY_CARE_PROVIDER_SITE_OTHER): Payer: Medicare PPO | Admitting: Family

## 2021-01-22 ENCOUNTER — Encounter (HOSPITAL_BASED_OUTPATIENT_CLINIC_OR_DEPARTMENT_OTHER): Payer: Self-pay | Admitting: Family

## 2021-01-22 VITALS — BP 146/60 | HR 65 | Ht 66.0 in | Wt 127.0 lb

## 2021-01-22 DIAGNOSIS — I639 Cerebral infarction, unspecified: Secondary | ICD-10-CM

## 2021-01-22 DIAGNOSIS — I491 Atrial premature depolarization: Secondary | ICD-10-CM | POA: Diagnosis not present

## 2021-01-22 DIAGNOSIS — E782 Mixed hyperlipidemia: Secondary | ICD-10-CM | POA: Diagnosis not present

## 2021-01-22 DIAGNOSIS — I471 Supraventricular tachycardia: Secondary | ICD-10-CM | POA: Diagnosis not present

## 2021-01-22 DIAGNOSIS — I350 Nonrheumatic aortic (valve) stenosis: Secondary | ICD-10-CM | POA: Diagnosis not present

## 2021-01-22 DIAGNOSIS — I10 Essential (primary) hypertension: Secondary | ICD-10-CM | POA: Diagnosis not present

## 2021-01-22 DIAGNOSIS — Z8673 Personal history of transient ischemic attack (TIA), and cerebral infarction without residual deficits: Secondary | ICD-10-CM

## 2021-01-22 NOTE — Patient Instructions (Addendum)
Medication Instructions:  Your physician has recommended you make the following change in your medication:    RESUME Aspirin EC 81mg  daily  Loel Dubonnet, NP will talk to Dr. Oval Linsey about medicine to help prevent fast heart beats and early heart beats.  *If you need a refill on your cardiac medications before your next appointment, please call your pharmacy*  Lab Work: None ordered today.   Testing/Procedures: Your EKG today showed sinus bradycardia with occasional early beats.  Your ZIO monitor had a preliminary report showing frequent episodes of a heart beat called SVT and early beats called PAC's.   Follow-Up: At [facility], you and your health needs are our priority.  As part of our continuing mission to provide you with exceptional heart care, we have created designated Provider Care Teams.  These Care Teams include your primary Cardiologist (physician) and Advanced Practice Providers (APPs -  Physician Assistants and Nurse Practitioners) who all work together to provide you with the care you need, when you need it.  We recommend signing up for the patient portal called "MyChart".  Sign up information is provided on this After Visit Summary.  MyChart is used to connect with patients for Virtual Visits (Telemedicine).  Patients are able to view lab/test results, encounter notes, upcoming appointments, etc.  Non-urgent messages can be sent to your provider as well.   To learn more about what you can do with MyChart, go to [email or web address].    Your next appointment:   3 month(s)  The format for your next appointment:   In Person  Provider:   Skeet Latch, MD or Loel Dubonnet, NP    Other Instructions  Heart Healthy Diet Recommendations: A low-salt diet is recommended. Meats should be grilled, baked, or boiled. Avoid fried foods. Focus on lean protein sources like fish or chicken with vegetables and fruits. The American Heart Association is a Educational psychologist!  American Heart Association Diet and Lifeystyle Recommendations   Exercise recommendations: The American Heart Association recommends 150 minutes of moderate intensity exercise weekly. Try 30 minutes of moderate intensity exercise 4-5 times per week. This could include walking, jogging, or swimming.

## 2021-01-23 ENCOUNTER — Inpatient Hospital Stay: Payer: Medicare PPO | Admitting: Adult Health

## 2021-01-23 DIAGNOSIS — I69351 Hemiplegia and hemiparesis following cerebral infarction affecting right dominant side: Secondary | ICD-10-CM | POA: Diagnosis not present

## 2021-01-23 DIAGNOSIS — I5022 Chronic systolic (congestive) heart failure: Secondary | ICD-10-CM | POA: Diagnosis not present

## 2021-01-23 DIAGNOSIS — N183 Chronic kidney disease, stage 3 unspecified: Secondary | ICD-10-CM | POA: Diagnosis not present

## 2021-01-23 DIAGNOSIS — E782 Mixed hyperlipidemia: Secondary | ICD-10-CM | POA: Diagnosis not present

## 2021-01-23 DIAGNOSIS — E876 Hypokalemia: Secondary | ICD-10-CM | POA: Diagnosis not present

## 2021-01-23 DIAGNOSIS — I129 Hypertensive chronic kidney disease with stage 1 through stage 4 chronic kidney disease, or unspecified chronic kidney disease: Secondary | ICD-10-CM | POA: Diagnosis not present

## 2021-01-23 DIAGNOSIS — I130 Hypertensive heart and chronic kidney disease with heart failure and stage 1 through stage 4 chronic kidney disease, or unspecified chronic kidney disease: Secondary | ICD-10-CM | POA: Diagnosis not present

## 2021-01-23 DIAGNOSIS — I671 Cerebral aneurysm, nonruptured: Secondary | ICD-10-CM | POA: Diagnosis not present

## 2021-01-23 DIAGNOSIS — I447 Left bundle-branch block, unspecified: Secondary | ICD-10-CM | POA: Diagnosis not present

## 2021-01-23 DIAGNOSIS — I700 Atherosclerosis of aorta: Secondary | ICD-10-CM | POA: Diagnosis not present

## 2021-01-23 DIAGNOSIS — D631 Anemia in chronic kidney disease: Secondary | ICD-10-CM | POA: Diagnosis not present

## 2021-01-24 ENCOUNTER — Other Ambulatory Visit: Payer: Medicare PPO

## 2021-01-24 ENCOUNTER — Other Ambulatory Visit: Payer: Medicare PPO | Admitting: *Deleted

## 2021-01-24 ENCOUNTER — Other Ambulatory Visit: Payer: Self-pay

## 2021-01-24 VITALS — BP 149/81 | HR 86 | Temp 97.6°F | Resp 17

## 2021-01-24 DIAGNOSIS — I129 Hypertensive chronic kidney disease with stage 1 through stage 4 chronic kidney disease, or unspecified chronic kidney disease: Secondary | ICD-10-CM | POA: Diagnosis not present

## 2021-01-24 DIAGNOSIS — Z515 Encounter for palliative care: Secondary | ICD-10-CM

## 2021-01-24 DIAGNOSIS — E782 Mixed hyperlipidemia: Secondary | ICD-10-CM | POA: Diagnosis not present

## 2021-01-24 DIAGNOSIS — I700 Atherosclerosis of aorta: Secondary | ICD-10-CM | POA: Diagnosis not present

## 2021-01-24 DIAGNOSIS — E876 Hypokalemia: Secondary | ICD-10-CM | POA: Diagnosis not present

## 2021-01-24 DIAGNOSIS — I5022 Chronic systolic (congestive) heart failure: Secondary | ICD-10-CM | POA: Diagnosis not present

## 2021-01-24 DIAGNOSIS — I671 Cerebral aneurysm, nonruptured: Secondary | ICD-10-CM | POA: Diagnosis not present

## 2021-01-24 DIAGNOSIS — I447 Left bundle-branch block, unspecified: Secondary | ICD-10-CM | POA: Diagnosis not present

## 2021-01-24 DIAGNOSIS — D631 Anemia in chronic kidney disease: Secondary | ICD-10-CM | POA: Diagnosis not present

## 2021-01-24 DIAGNOSIS — N183 Chronic kidney disease, stage 3 unspecified: Secondary | ICD-10-CM | POA: Diagnosis not present

## 2021-01-24 NOTE — Progress Notes (Signed)
[facility] COMMUNITY PALLIATIVE CARE RN NOTE  PATIENT NAME: [name] DOB: 1925/04/11 MRN: [id number]  PRIMARY CARE PROVIDER: [name], NP  RESPONSIBLE PARTY:  Acct ID - Guarantor Home Phone Work Phone Relationship Acct Type  [id number] [name]  Self P/F     [address], [place], [place] [address]   Covid-19 Pre-screening Negative  PLAN OF CARE and INTERVENTION:  ADVANCE CARE PLANNING/GOALS OF CARE: Goal is for patient to continue having a good quality of life and remain in her home. PATIENT/CAREGIVER EDUCATION: Symptom management DISEASE STATUS: Joint palliative care follow up visit made with Katheren Puller, MSW. Upon arrival, patient is sitting up in a chair in her living room awake and alert. She is able to engage in appropriate conversation and make her needs known. Forgetful at times. She denies pain. She is ambulatory using her walker. She requires stand-by assistance with showers, but able to perform all other ADLs independently. She is receiving in home PT. She has a good appetite. She denies dysphagia and taking her medications without difficulty. She was hospitalized from 12/23/20 - 12/27/20. She says that prior to going to the hospital, she began having numbness and weakness in her right hand/arm, where she was unable to grasp objects and right leg weakness.  She was diagnosed with cerebral thrombosis with cerebral infarction. She was on Aspirin 81 mg along with Plavix 75 mg for 3 weeks, now is just taking Aspirin. Her symptoms have resolved. She says she is doing well and doesn't have any issues at this time. She is about to go out for a manicure. She is appreciative of visit.   CODE STATUS: DNR ADVANCED DIRECTIVES: Y MOST FORM: no PPS: 50%   PHYSICAL EXAM:   VITALS: Today's Vitals   01/24/21 0911  BP: (!) 149/81  Pulse: 86  Resp: 17  Temp: 97.6 F (36.4 C)  SpO2: 93%  PainSc: 0-No pain    LUNGS: clear to auscultation  CARDIAC: Cor  RRR EXTREMITIES: No edema SKIN:  Exposed skin is dry and intact   NEURO:  Alert and oriented x 3, forgetful, ambulatory w/walker   (Duration of visit and documentation 45 minutes)   Daryl Eastern, RN BSN

## 2021-01-25 ENCOUNTER — Telehealth: Payer: Self-pay | Admitting: Adult Health

## 2021-01-25 NOTE — Telephone Encounter (Signed)
Flor from [facility] called to get verbal orders for PT 1 time a week for 6 weeks.  [place] [phone]

## 2021-01-25 NOTE — Telephone Encounter (Signed)
Okay for verbal orders? Please advise 

## 2021-01-26 DIAGNOSIS — H401131 Primary open-angle glaucoma, bilateral, mild stage: Secondary | ICD-10-CM | POA: Diagnosis not present

## 2021-01-26 DIAGNOSIS — I671 Cerebral aneurysm, nonruptured: Secondary | ICD-10-CM | POA: Diagnosis not present

## 2021-01-26 DIAGNOSIS — I5022 Chronic systolic (congestive) heart failure: Secondary | ICD-10-CM | POA: Diagnosis not present

## 2021-01-26 DIAGNOSIS — E876 Hypokalemia: Secondary | ICD-10-CM | POA: Diagnosis not present

## 2021-01-26 DIAGNOSIS — D631 Anemia in chronic kidney disease: Secondary | ICD-10-CM | POA: Diagnosis not present

## 2021-01-26 DIAGNOSIS — I700 Atherosclerosis of aorta: Secondary | ICD-10-CM | POA: Diagnosis not present

## 2021-01-26 DIAGNOSIS — Z961 Presence of intraocular lens: Secondary | ICD-10-CM | POA: Diagnosis not present

## 2021-01-26 DIAGNOSIS — H26492 Other secondary cataract, left eye: Secondary | ICD-10-CM | POA: Diagnosis not present

## 2021-01-26 DIAGNOSIS — H353222 Exudative age-related macular degeneration, left eye, with inactive choroidal neovascularization: Secondary | ICD-10-CM | POA: Diagnosis not present

## 2021-01-26 DIAGNOSIS — E782 Mixed hyperlipidemia: Secondary | ICD-10-CM | POA: Diagnosis not present

## 2021-01-26 DIAGNOSIS — I129 Hypertensive chronic kidney disease with stage 1 through stage 4 chronic kidney disease, or unspecified chronic kidney disease: Secondary | ICD-10-CM | POA: Diagnosis not present

## 2021-01-26 DIAGNOSIS — I447 Left bundle-branch block, unspecified: Secondary | ICD-10-CM | POA: Diagnosis not present

## 2021-01-26 DIAGNOSIS — N183 Chronic kidney disease, stage 3 unspecified: Secondary | ICD-10-CM | POA: Diagnosis not present

## 2021-01-26 NOTE — Progress Notes (Deleted)
COMMUNITY PALLIATIVE CARE SW NOTE  PATIENT NAME: [name] DOB: 05/27/1925 MRN: [id number]  PRIMARY CARE PROVIDER: [name], NP  RESPONSIBLE PARTY:  Acct ID - Guarantor Home Phone Work Phone Relationship Acct Type  [id number] [name]  Self P/F     [address], [place], [place] [address]     PLAN OF CARE and INTERVENTIONS:             GOALS OF CARE/ ADVANCE CARE PLANNING:  Goal is to keep patient at home. Patient is a DNR. SOCIAL/EMOTIONAL/SPIRITUAL ASSESSMENT/ INTERVENTIONS:  SW and RN-M. Nadara Mustard completed a follow-up visit with patient at her home following a call from her daughter who reported that patient was declining. Patient's daughter described a progressive decline in patient since she had a fall on the 7/12. Patient had metastatic breast cancer with lesions to her spine and liver. Patient no longer sleeps in her bed, but in her recliner. She has more profound involuntary tremors in her hands.Patient and pain to her shoulder. Patient ambulates with a walker, but that is only from her chair to the bathroom, otherwise patient is ambulated in a wheelchair throughout the house. Patient is at risk for falls. Patient's appetite is fair, but she has started pocketing food and is encouraged to eat. Patient's meats are cut up, but she does have intermittent coughing when she eats. Generally her appetite varies, particularly at dinner. Patient continues to have a dry cough. Patient is very hard of hearing, however she is responsive to simple yes/no questions. Hospice education was provided to patient's daughter as patient seems to hospice eligible at this. Patient's daughter agreed for nurse to discuss patient's decline to seek an order for a hospice consult. RN to follow-up.  PATIENT/CAREGIVER EDUCATION/ COPING:  Patient appears to be coping well. Patient's family is coping well. Her son-in-law is a PA, and has been instrumental in guiding the family on patient  changes. PERSONAL EMERGENCY PLAN:  911 can be activated for emergencies. COMMUNITY RESOURCES COORDINATION/ HEALTH CARE NAVIGATION:  Patient's daughter has contacted [facility] for in-home care support for patient. FINANCIAL/LEGAL CONCERNS/INTERVENTIONS:  None.      SOCIAL HX:  Social History   Tobacco Use   Smoking status: Former    Packs/day: 1.50    Years: 29.00    Pack years: 43.50    Types: Cigarettes    Start date: 07/08/1949    Quit date: 07/08/1978    Years since quitting: 42.5   Smokeless tobacco: Never  Substance Use Topics   Alcohol use: Yes    Alcohol/week: 1.0 standard drink    Types: 1 Glasses of wine per week    Comment: "red wine"- some nights    CODE STATUS: DNR ADVANCED DIRECTIVES: Yes MOST FORM COMPLETE:  Yes HOSPICE EDUCATION PROVIDED: Yes, consult to be sought.   PPS: Patient's ability to ambulate has declined to being pushed in a wheelchair by her family. She is pocketing food, at risk for falls and has increased involuntary shaking in her hands.  Duration of visit and documentation: 60 minutes.     [address] [place], [place]

## 2021-01-26 NOTE — Telephone Encounter (Signed)
Left message to return phone call.

## 2021-01-26 NOTE — Progress Notes (Signed)
Initially documented on the wrong patient/NOTE DELETED  Documentation for [name]  SW and RN-M. Nadara Mustard completed a  follow-up palliative visit with patient at her home. She was present with her daughter Cecille Rubin. Patient was sitting in the living-room, waiting to go out with a friend. Patient was in a great mood, she was cordial, humorous and engaged. She denied pain. Patient reported that she was doing well. She is sleeping well. She ambulates independently and go with friends and family. She reports that her appetite is good. Patient has [facility] for PT and OT 2x's week. She also has a private caregiver 2x/week. 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 Patient verbalized no concerns. Her daughter lives across the street and is accessible for any needs/support. Patient remains open to ongoing palliative care visits/support. The team reinforced contact information and encouraged her to call with any questions or concerns.

## 2021-01-28 DIAGNOSIS — D631 Anemia in chronic kidney disease: Secondary | ICD-10-CM | POA: Diagnosis not present

## 2021-01-28 DIAGNOSIS — I447 Left bundle-branch block, unspecified: Secondary | ICD-10-CM | POA: Diagnosis not present

## 2021-01-28 DIAGNOSIS — I5021 Acute systolic (congestive) heart failure: Secondary | ICD-10-CM | POA: Diagnosis not present

## 2021-01-28 DIAGNOSIS — I700 Atherosclerosis of aorta: Secondary | ICD-10-CM | POA: Diagnosis not present

## 2021-01-28 DIAGNOSIS — I69351 Hemiplegia and hemiparesis following cerebral infarction affecting right dominant side: Secondary | ICD-10-CM | POA: Diagnosis not present

## 2021-01-28 DIAGNOSIS — I671 Cerebral aneurysm, nonruptured: Secondary | ICD-10-CM | POA: Diagnosis not present

## 2021-01-28 DIAGNOSIS — I130 Hypertensive heart and chronic kidney disease with heart failure and stage 1 through stage 4 chronic kidney disease, or unspecified chronic kidney disease: Secondary | ICD-10-CM | POA: Diagnosis not present

## 2021-01-28 DIAGNOSIS — I5022 Chronic systolic (congestive) heart failure: Secondary | ICD-10-CM | POA: Diagnosis not present

## 2021-01-28 DIAGNOSIS — N183 Chronic kidney disease, stage 3 unspecified: Secondary | ICD-10-CM | POA: Diagnosis not present

## 2021-01-29 ENCOUNTER — Telehealth: Payer: Self-pay | Admitting: Adult Health

## 2021-01-29 MED ORDER — CIPROFLOXACIN HCL 500 MG PO TABS: 500.0000 mg | ORAL_TABLET | Freq: Two times a day (BID) | ORAL | 0 refills | Status: DC

## 2021-01-29 NOTE — Telephone Encounter (Signed)
The patient's daughter [name] called to see if [name] can call in an antibiotic for the patient because the home health nurse dropped off an UA sample and she has Ecoli.   [employer] [id number]

## 2021-01-29 NOTE — Telephone Encounter (Signed)
[place] called wanting to know if an antibiotic was called in for the patient.  The patient had an appointment with [name] on 01/23/2021 and canceled the appointment because the home health nurse was coming to the house. The nurse took a UA sample and it had an odor to it so the nurse told her that she had a UTI.   The daughters are wanting an antibiotic called in today to catch the infection before it gets worse and put her in the hospital like last time.  [id number] [place] [place] [address]:  [phone] Fax:  [phone]

## 2021-01-29 NOTE — Telephone Encounter (Signed)
I sent in 7 days of Cipro

## 2021-01-29 NOTE — Addendum Note (Signed)
Addended by: Alysia Penna A on: 01/29/2021 05:47 PM   Modules accepted: Orders

## 2021-01-30 DIAGNOSIS — D631 Anemia in chronic kidney disease: Secondary | ICD-10-CM | POA: Diagnosis not present

## 2021-01-30 DIAGNOSIS — H353221 Exudative age-related macular degeneration, left eye, with active choroidal neovascularization: Secondary | ICD-10-CM | POA: Diagnosis not present

## 2021-01-30 DIAGNOSIS — H35033 Hypertensive retinopathy, bilateral: Secondary | ICD-10-CM | POA: Diagnosis not present

## 2021-01-30 DIAGNOSIS — H26492 Other secondary cataract, left eye: Secondary | ICD-10-CM | POA: Diagnosis not present

## 2021-01-30 DIAGNOSIS — I5022 Chronic systolic (congestive) heart failure: Secondary | ICD-10-CM | POA: Diagnosis not present

## 2021-01-30 DIAGNOSIS — I700 Atherosclerosis of aorta: Secondary | ICD-10-CM | POA: Diagnosis not present

## 2021-01-30 DIAGNOSIS — I130 Hypertensive heart and chronic kidney disease with heart failure and stage 1 through stage 4 chronic kidney disease, or unspecified chronic kidney disease: Secondary | ICD-10-CM | POA: Diagnosis not present

## 2021-01-30 DIAGNOSIS — I69351 Hemiplegia and hemiparesis following cerebral infarction affecting right dominant side: Secondary | ICD-10-CM | POA: Diagnosis not present

## 2021-01-30 DIAGNOSIS — N183 Chronic kidney disease, stage 3 unspecified: Secondary | ICD-10-CM | POA: Diagnosis not present

## 2021-01-30 DIAGNOSIS — I5021 Acute systolic (congestive) heart failure: Secondary | ICD-10-CM | POA: Diagnosis not present

## 2021-01-30 DIAGNOSIS — H353113 Nonexudative age-related macular degeneration, right eye, advanced atrophic without subfoveal involvement: Secondary | ICD-10-CM | POA: Diagnosis not present

## 2021-01-30 DIAGNOSIS — I671 Cerebral aneurysm, nonruptured: Secondary | ICD-10-CM | POA: Diagnosis not present

## 2021-01-30 DIAGNOSIS — I447 Left bundle-branch block, unspecified: Secondary | ICD-10-CM | POA: Diagnosis not present

## 2021-01-30 NOTE — Telephone Encounter (Signed)
Patient daughter notified of update  and verbalized understanding. 

## 2021-01-30 NOTE — Telephone Encounter (Signed)
verbal orders given to [facility].

## 2021-01-31 DIAGNOSIS — D631 Anemia in chronic kidney disease: Secondary | ICD-10-CM | POA: Diagnosis not present

## 2021-01-31 DIAGNOSIS — I700 Atherosclerosis of aorta: Secondary | ICD-10-CM | POA: Diagnosis not present

## 2021-01-31 DIAGNOSIS — I671 Cerebral aneurysm, nonruptured: Secondary | ICD-10-CM | POA: Diagnosis not present

## 2021-01-31 DIAGNOSIS — N183 Chronic kidney disease, stage 3 unspecified: Secondary | ICD-10-CM | POA: Diagnosis not present

## 2021-01-31 DIAGNOSIS — I69351 Hemiplegia and hemiparesis following cerebral infarction affecting right dominant side: Secondary | ICD-10-CM | POA: Diagnosis not present

## 2021-01-31 DIAGNOSIS — I447 Left bundle-branch block, unspecified: Secondary | ICD-10-CM | POA: Diagnosis not present

## 2021-01-31 DIAGNOSIS — I130 Hypertensive heart and chronic kidney disease with heart failure and stage 1 through stage 4 chronic kidney disease, or unspecified chronic kidney disease: Secondary | ICD-10-CM | POA: Diagnosis not present

## 2021-01-31 DIAGNOSIS — I5021 Acute systolic (congestive) heart failure: Secondary | ICD-10-CM | POA: Diagnosis not present

## 2021-01-31 DIAGNOSIS — I5022 Chronic systolic (congestive) heart failure: Secondary | ICD-10-CM | POA: Diagnosis not present

## 2021-02-06 DIAGNOSIS — I671 Cerebral aneurysm, nonruptured: Secondary | ICD-10-CM | POA: Diagnosis not present

## 2021-02-06 DIAGNOSIS — I5022 Chronic systolic (congestive) heart failure: Secondary | ICD-10-CM | POA: Diagnosis not present

## 2021-02-06 DIAGNOSIS — I447 Left bundle-branch block, unspecified: Secondary | ICD-10-CM | POA: Diagnosis not present

## 2021-02-06 DIAGNOSIS — D631 Anemia in chronic kidney disease: Secondary | ICD-10-CM | POA: Diagnosis not present

## 2021-02-06 DIAGNOSIS — I130 Hypertensive heart and chronic kidney disease with heart failure and stage 1 through stage 4 chronic kidney disease, or unspecified chronic kidney disease: Secondary | ICD-10-CM | POA: Diagnosis not present

## 2021-02-06 DIAGNOSIS — N183 Chronic kidney disease, stage 3 unspecified: Secondary | ICD-10-CM | POA: Diagnosis not present

## 2021-02-06 DIAGNOSIS — I700 Atherosclerosis of aorta: Secondary | ICD-10-CM | POA: Diagnosis not present

## 2021-02-06 DIAGNOSIS — I69351 Hemiplegia and hemiparesis following cerebral infarction affecting right dominant side: Secondary | ICD-10-CM | POA: Diagnosis not present

## 2021-02-06 DIAGNOSIS — I5021 Acute systolic (congestive) heart failure: Secondary | ICD-10-CM | POA: Diagnosis not present

## 2021-02-07 DIAGNOSIS — I700 Atherosclerosis of aorta: Secondary | ICD-10-CM | POA: Diagnosis not present

## 2021-02-07 DIAGNOSIS — N183 Chronic kidney disease, stage 3 unspecified: Secondary | ICD-10-CM | POA: Diagnosis not present

## 2021-02-07 DIAGNOSIS — I5021 Acute systolic (congestive) heart failure: Secondary | ICD-10-CM | POA: Diagnosis not present

## 2021-02-07 DIAGNOSIS — I671 Cerebral aneurysm, nonruptured: Secondary | ICD-10-CM | POA: Diagnosis not present

## 2021-02-07 DIAGNOSIS — I69351 Hemiplegia and hemiparesis following cerebral infarction affecting right dominant side: Secondary | ICD-10-CM | POA: Diagnosis not present

## 2021-02-07 DIAGNOSIS — D631 Anemia in chronic kidney disease: Secondary | ICD-10-CM | POA: Diagnosis not present

## 2021-02-07 DIAGNOSIS — I130 Hypertensive heart and chronic kidney disease with heart failure and stage 1 through stage 4 chronic kidney disease, or unspecified chronic kidney disease: Secondary | ICD-10-CM | POA: Diagnosis not present

## 2021-02-07 DIAGNOSIS — I5022 Chronic systolic (congestive) heart failure: Secondary | ICD-10-CM | POA: Diagnosis not present

## 2021-02-07 DIAGNOSIS — I447 Left bundle-branch block, unspecified: Secondary | ICD-10-CM | POA: Diagnosis not present

## 2021-02-08 ENCOUNTER — Telehealth: Payer: Self-pay

## 2021-02-08 DIAGNOSIS — H26492 Other secondary cataract, left eye: Secondary | ICD-10-CM | POA: Diagnosis not present

## 2021-02-08 NOTE — Telephone Encounter (Signed)
-----   Message from Garvin Fila, MD sent at 02/08/2021  4:41 PM EDT ----- [name] inform the patient that 2-week external heart monitoring study did not show any evidence of atrial fibrillation.  She had a few brief episodes of rapid heart rate but no worrisome arrhythmias. ----- Message ----- From: Skeet Latch, MD Sent: 02/07/2021   3:22 PM EDT To: Garvin Fila, MD

## 2021-02-08 NOTE — Telephone Encounter (Signed)
I called pt's daughter Arville Go) and left vm ( ok per dpr) advising of results of study.  Advised she could call back if she had any questions/concerns.

## 2021-02-13 DIAGNOSIS — I671 Cerebral aneurysm, nonruptured: Secondary | ICD-10-CM | POA: Diagnosis not present

## 2021-02-13 DIAGNOSIS — I130 Hypertensive heart and chronic kidney disease with heart failure and stage 1 through stage 4 chronic kidney disease, or unspecified chronic kidney disease: Secondary | ICD-10-CM | POA: Diagnosis not present

## 2021-02-13 DIAGNOSIS — I5022 Chronic systolic (congestive) heart failure: Secondary | ICD-10-CM | POA: Diagnosis not present

## 2021-02-13 DIAGNOSIS — I5021 Acute systolic (congestive) heart failure: Secondary | ICD-10-CM | POA: Diagnosis not present

## 2021-02-13 DIAGNOSIS — I69351 Hemiplegia and hemiparesis following cerebral infarction affecting right dominant side: Secondary | ICD-10-CM | POA: Diagnosis not present

## 2021-02-13 DIAGNOSIS — I700 Atherosclerosis of aorta: Secondary | ICD-10-CM | POA: Diagnosis not present

## 2021-02-13 DIAGNOSIS — D631 Anemia in chronic kidney disease: Secondary | ICD-10-CM | POA: Diagnosis not present

## 2021-02-13 DIAGNOSIS — N183 Chronic kidney disease, stage 3 unspecified: Secondary | ICD-10-CM | POA: Diagnosis not present

## 2021-02-13 DIAGNOSIS — I447 Left bundle-branch block, unspecified: Secondary | ICD-10-CM | POA: Diagnosis not present

## 2021-02-15 ENCOUNTER — Encounter: Payer: Self-pay | Admitting: Physician Assistant

## 2021-02-15 ENCOUNTER — Ambulatory Visit (INDEPENDENT_AMBULATORY_CARE_PROVIDER_SITE_OTHER): Payer: Medicare PPO | Admitting: Physician Assistant

## 2021-02-15 ENCOUNTER — Other Ambulatory Visit: Payer: Self-pay

## 2021-02-15 DIAGNOSIS — L729 Follicular cyst of the skin and subcutaneous tissue, unspecified: Secondary | ICD-10-CM | POA: Diagnosis not present

## 2021-02-15 DIAGNOSIS — D229 Melanocytic nevi, unspecified: Secondary | ICD-10-CM

## 2021-02-15 DIAGNOSIS — L814 Other melanin hyperpigmentation: Secondary | ICD-10-CM | POA: Diagnosis not present

## 2021-02-15 DIAGNOSIS — Z1283 Encounter for screening for malignant neoplasm of skin: Secondary | ICD-10-CM | POA: Diagnosis not present

## 2021-02-15 DIAGNOSIS — L578 Other skin changes due to chronic exposure to nonionizing radiation: Secondary | ICD-10-CM | POA: Diagnosis not present

## 2021-02-15 DIAGNOSIS — D1800 Hemangioma unspecified site: Secondary | ICD-10-CM

## 2021-02-15 DIAGNOSIS — L821 Other seborrheic keratosis: Secondary | ICD-10-CM | POA: Diagnosis not present

## 2021-02-19 DIAGNOSIS — I69351 Hemiplegia and hemiparesis following cerebral infarction affecting right dominant side: Secondary | ICD-10-CM | POA: Diagnosis not present

## 2021-02-19 DIAGNOSIS — I5021 Acute systolic (congestive) heart failure: Secondary | ICD-10-CM | POA: Diagnosis not present

## 2021-02-19 DIAGNOSIS — I130 Hypertensive heart and chronic kidney disease with heart failure and stage 1 through stage 4 chronic kidney disease, or unspecified chronic kidney disease: Secondary | ICD-10-CM | POA: Diagnosis not present

## 2021-02-19 DIAGNOSIS — I700 Atherosclerosis of aorta: Secondary | ICD-10-CM | POA: Diagnosis not present

## 2021-02-19 DIAGNOSIS — I447 Left bundle-branch block, unspecified: Secondary | ICD-10-CM | POA: Diagnosis not present

## 2021-02-19 DIAGNOSIS — N183 Chronic kidney disease, stage 3 unspecified: Secondary | ICD-10-CM | POA: Diagnosis not present

## 2021-02-19 DIAGNOSIS — D631 Anemia in chronic kidney disease: Secondary | ICD-10-CM | POA: Diagnosis not present

## 2021-02-19 DIAGNOSIS — I5022 Chronic systolic (congestive) heart failure: Secondary | ICD-10-CM | POA: Diagnosis not present

## 2021-02-19 DIAGNOSIS — I671 Cerebral aneurysm, nonruptured: Secondary | ICD-10-CM | POA: Diagnosis not present

## 2021-02-21 ENCOUNTER — Encounter: Payer: Self-pay | Admitting: Physician Assistant

## 2021-02-21 NOTE — Progress Notes (Signed)
   Follow-Up Visit   Subjective  [place] is a 87 y.o. female who presents for the following: Annual Exam (Left temple right abdomen and back new lesions x months, history of MM and Non mole Skin Cancer.   The following portions of the chart were reviewed this encounter and updated as appropriate:  Tobacco  Allergies  Meds  Problems  Med Hx  Surg Hx  Fam Hx      Objective  Well appearing patient in no apparent distress; mood and affect are within normal limits.  All skin waist up and legs were examined.   Left Temple Large round, soft plaque   Assessment & Plan  Cyst of skin Left Temple  Stable and safe to leave if stable  Lentigines - Scattered tan macules - Discussed due to sun exposure - Benign, observe - Call for any changes  Seborrheic Keratoses - Stuck-on, waxy, tan-brown papules and plaques  - Discussed benign etiology and prognosis. - Observe - Call for any changes  Melanocytic Nevi - Tan-brown and/or pink-flesh-colored symmetric macules and papules - Benign appearing on exam today - Observation - Call clinic for new or changing moles - Recommend daily use of broad spectrum spf 30+ sunscreen to sun-exposed areas.   Hemangiomas - Red papules - Discussed benign nature - Observe - Call for any changes  Actinic Damage - diffuse scaly erythematous macules with underlying dyspigmentation - Recommend daily broad spectrum sunscreen SPF 30+ to sun-exposed areas, reapply every 2 hours as needed.  - Call for new or changing lesions.  Skin cancer screening performed today.  All scars were clear, no evidence of Lymphadenopathy.  I, Dylin Ihnen, PA-C, have reviewed all documentation's for this visit.  The documentation on 02/21/21 for the exam, diagnosis, procedures and orders are all accurate and complete.

## 2021-02-24 ENCOUNTER — Other Ambulatory Visit: Payer: Self-pay | Admitting: Adult Health

## 2021-02-26 ENCOUNTER — Emergency Department (HOSPITAL_COMMUNITY): Payer: Medicare PPO

## 2021-02-26 ENCOUNTER — Encounter (HOSPITAL_COMMUNITY): Payer: Self-pay

## 2021-02-26 ENCOUNTER — Emergency Department (HOSPITAL_COMMUNITY)
Admission: EM | Admit: 2021-02-26 | Discharge: 2021-02-26 | Disposition: A | Discharge status: Home / Self Care | Payer: Medicare PPO | Attending: Emergency Medicine | Admitting: Emergency Medicine

## 2021-02-26 ENCOUNTER — Other Ambulatory Visit: Payer: Self-pay

## 2021-02-26 DIAGNOSIS — Z8616 Personal history of COVID-19: Secondary | ICD-10-CM | POA: Diagnosis not present

## 2021-02-26 DIAGNOSIS — Z87891 Personal history of nicotine dependence: Secondary | ICD-10-CM | POA: Diagnosis not present

## 2021-02-26 DIAGNOSIS — R531 Weakness: Secondary | ICD-10-CM | POA: Insufficient documentation

## 2021-02-26 DIAGNOSIS — J441 Chronic obstructive pulmonary disease with (acute) exacerbation: Secondary | ICD-10-CM | POA: Insufficient documentation

## 2021-02-26 DIAGNOSIS — I672 Cerebral atherosclerosis: Secondary | ICD-10-CM | POA: Diagnosis not present

## 2021-02-26 DIAGNOSIS — I5021 Acute systolic (congestive) heart failure: Secondary | ICD-10-CM | POA: Diagnosis not present

## 2021-02-26 DIAGNOSIS — I10 Essential (primary) hypertension: Secondary | ICD-10-CM | POA: Diagnosis not present

## 2021-02-26 DIAGNOSIS — I6503 Occlusion and stenosis of bilateral vertebral arteries: Secondary | ICD-10-CM | POA: Diagnosis not present

## 2021-02-26 DIAGNOSIS — R29810 Facial weakness: Secondary | ICD-10-CM | POA: Diagnosis not present

## 2021-02-26 DIAGNOSIS — M4312 Spondylolisthesis, cervical region: Secondary | ICD-10-CM | POA: Diagnosis not present

## 2021-02-26 DIAGNOSIS — I130 Hypertensive heart and chronic kidney disease with heart failure and stage 1 through stage 4 chronic kidney disease, or unspecified chronic kidney disease: Secondary | ICD-10-CM | POA: Diagnosis not present

## 2021-02-26 DIAGNOSIS — Z79899 Other long term (current) drug therapy: Secondary | ICD-10-CM | POA: Diagnosis not present

## 2021-02-26 DIAGNOSIS — S13120A Subluxation of C1/C2 cervical vertebrae, initial encounter: Secondary | ICD-10-CM | POA: Diagnosis not present

## 2021-02-26 DIAGNOSIS — Z7951 Long term (current) use of inhaled steroids: Secondary | ICD-10-CM | POA: Insufficient documentation

## 2021-02-26 DIAGNOSIS — N183 Chronic kidney disease, stage 3 unspecified: Secondary | ICD-10-CM | POA: Diagnosis not present

## 2021-02-26 DIAGNOSIS — R0602 Shortness of breath: Secondary | ICD-10-CM | POA: Diagnosis not present

## 2021-02-26 DIAGNOSIS — Z7982 Long term (current) use of aspirin: Secondary | ICD-10-CM | POA: Diagnosis not present

## 2021-02-26 DIAGNOSIS — H538 Other visual disturbances: Secondary | ICD-10-CM | POA: Insufficient documentation

## 2021-02-26 DIAGNOSIS — M47812 Spondylosis without myelopathy or radiculopathy, cervical region: Secondary | ICD-10-CM | POA: Diagnosis not present

## 2021-02-26 DIAGNOSIS — R29898 Other symptoms and signs involving the musculoskeletal system: Secondary | ICD-10-CM | POA: Diagnosis not present

## 2021-02-26 DIAGNOSIS — I6523 Occlusion and stenosis of bilateral carotid arteries: Secondary | ICD-10-CM | POA: Diagnosis not present

## 2021-02-26 DIAGNOSIS — Z20822 Contact with and (suspected) exposure to covid-19: Secondary | ICD-10-CM | POA: Insufficient documentation

## 2021-02-26 DIAGNOSIS — R52 Pain, unspecified: Secondary | ICD-10-CM | POA: Diagnosis not present

## 2021-02-26 DIAGNOSIS — E039 Hypothyroidism, unspecified: Secondary | ICD-10-CM | POA: Insufficient documentation

## 2021-02-26 DIAGNOSIS — S13150A Subluxation of C4/C5 cervical vertebrae, initial encounter: Secondary | ICD-10-CM | POA: Diagnosis not present

## 2021-02-26 DIAGNOSIS — R29818 Other symptoms and signs involving the nervous system: Secondary | ICD-10-CM | POA: Diagnosis not present

## 2021-02-26 DIAGNOSIS — M4802 Spinal stenosis, cervical region: Secondary | ICD-10-CM | POA: Diagnosis not present

## 2021-02-26 LAB — CBC
HCT: 37.7 % (ref 36.0–46.0)
Hemoglobin: 11.9 g/dL — ABNORMAL LOW (ref 12.0–15.0)
MCH: 29.5 pg (ref 26.0–34.0)
MCHC: 31.6 g/dL (ref 30.0–36.0)
MCV: 93.3 fL (ref 80.0–100.0)
Platelets: 193 10*3/uL (ref 150–400)
RBC: 4.04 MIL/uL (ref 3.87–5.11)
RDW: 13 % (ref 11.5–15.5)
WBC: 6.4 10*3/uL (ref 4.0–10.5)
nRBC: 0 % (ref 0.0–0.2)

## 2021-02-26 LAB — DIFFERENTIAL
Abs Immature Granulocytes: 0.03 10*3/uL (ref 0.00–0.07)
Basophils Absolute: 0 10*3/uL (ref 0.0–0.1)
Basophils Relative: 1 %
Eosinophils Absolute: 0.1 10*3/uL (ref 0.0–0.5)
Eosinophils Relative: 1 %
Immature Granulocytes: 1 %
Lymphocytes Relative: 16 %
Lymphs Abs: 1 10*3/uL (ref 0.7–4.0)
Monocytes Absolute: 0.4 10*3/uL (ref 0.1–1.0)
Monocytes Relative: 6 %
Neutro Abs: 4.9 10*3/uL (ref 1.7–7.7)
Neutrophils Relative %: 75 %

## 2021-02-26 LAB — URINALYSIS, ROUTINE W REFLEX MICROSCOPIC
Bilirubin Urine: NEGATIVE
Glucose, UA: NEGATIVE mg/dL
Hgb urine dipstick: NEGATIVE
Ketones, ur: NEGATIVE mg/dL
Leukocytes,Ua: NEGATIVE
Nitrite: NEGATIVE
Protein, ur: NEGATIVE mg/dL
Specific Gravity, Urine: 1.012 (ref 1.005–1.030)
pH: 8 (ref 5.0–8.0)

## 2021-02-26 LAB — I-STAT CHEM 8, ED
BUN: 21 mg/dL (ref 8–23)
Calcium, Ion: 1.12 mmol/L — ABNORMAL LOW (ref 1.15–1.40)
Chloride: 102 mmol/L (ref 98–111)
Creatinine, Ser: 1.2 mg/dL — ABNORMAL HIGH (ref 0.44–1.00)
Glucose, Bld: 169 mg/dL — ABNORMAL HIGH (ref 70–99)
HCT: 36 % (ref 36.0–46.0)
Hemoglobin: 12.2 g/dL (ref 12.0–15.0)
Potassium: 3.9 mmol/L (ref 3.5–5.1)
Sodium: 138 mmol/L (ref 135–145)
TCO2: 26 mmol/L (ref 22–32)

## 2021-02-26 LAB — SARS CORONAVIRUS 2 (TAT 6-24 HRS): SARS Coronavirus 2: NEGATIVE

## 2021-02-26 LAB — COMPREHENSIVE METABOLIC PANEL
ALT: 17 U/L (ref 0–44)
AST: 20 U/L (ref 15–41)
Albumin: 3.6 g/dL (ref 3.5–5.0)
Alkaline Phosphatase: 95 U/L (ref 38–126)
Anion gap: 8 (ref 5–15)
BUN: 19 mg/dL (ref 8–23)
CO2: 25 mmol/L (ref 22–32)
Calcium: 8.8 mg/dL — ABNORMAL LOW (ref 8.9–10.3)
Chloride: 104 mmol/L (ref 98–111)
Creatinine, Ser: 1.32 mg/dL — ABNORMAL HIGH (ref 0.44–1.00)
GFR, Estimated: 38 mL/min — ABNORMAL LOW (ref >60)
Glucose, Bld: 170 mg/dL — ABNORMAL HIGH (ref 70–99)
Potassium: 4 mmol/L (ref 3.5–5.1)
Sodium: 137 mmol/L (ref 135–145)
Total Bilirubin: 0.9 mg/dL (ref 0.3–1.2)
Total Protein: 6 g/dL — ABNORMAL LOW (ref 6.5–8.1)

## 2021-02-26 LAB — PROTIME-INR
INR: 1 (ref 0.8–1.2)
Prothrombin Time: 13.1 seconds (ref 11.4–15.2)

## 2021-02-26 LAB — APTT: aPTT: 27 seconds (ref 24–36)

## 2021-02-26 MED ORDER — SODIUM CHLORIDE 0.9% FLUSH: 3.0000 mL | Freq: Once | INTRAVENOUS | Status: DC

## 2021-02-26 MED ORDER — IOHEXOL 350 MG/ML SOLN
60.0000 mL | Freq: Once | INTRAVENOUS | Status: AC | PRN
  Administered 2021-02-26: 60 mL via INTRAVENOUS

## 2021-02-26 NOTE — ED Notes (Signed)
Placed pt back on pure wick.

## 2021-02-26 NOTE — ED Notes (Signed)
Transported to MRI at this time

## 2021-02-26 NOTE — ED Provider Notes (Signed)
[facility] EMERGENCY DEPARTMENT Provider Note   CSN: [id number] Arrival date & time: 02/26/21  1334  An emergency department physician performed an initial assessment on this suspected stroke patient at 1338.  History Chief Complaint  Patient presents with   Code Stroke    [name] is a 87 y.o. female.  Patient is a 87 year old female who presents as a code stroke.  She recently was admitted in June of this year for a stroke with right-sided arm weakness.  She was also noted to have an unchanged appearance of an aneurysm.  She presents today as a code stroke with right-sided weakness and double vision.  She said she noticed some difficulty with hand gripping yesterday but it was more pronounced this morning.  She also notices that her leg feels a little weaker than normal.  No trouble talking.  She says that sometimes she sees 2 of things.      Past Medical History:  Diagnosis Date   Anxiety    Aortic arch atherosclerosis ([place]) 06/24/2014   Atherosclerotic ulcer of aorta ([place]) 06/24/2014   Carotid artery occlusion    Chronic kidney disease    COPD (chronic obstructive pulmonary disease) ([place])    Shambaugh DISEASE, LUMBAR 12/16/2008   DIVERTICULOSIS, COLON 09/30/2008   DYSPNEA 07/13/2008   Graves disease    History of embolic stroke [id number]   Left brain   HYPERLIPIDEMIA 03/06/2007   HYPERTENSION 03/06/2007   HYPOTHYROIDISM 10/13/2007   OSTEOARTHRITIS 03/06/2007   Personal history of colonic polyps 09/30/2008   Stroke ([place])    06/2014            TOBACCO USE, QUIT 04/12/2009   WEAKNESS 11/09/2007    Patient Active Problem List   Diagnosis Date Noted   Cerebral thrombosis with cerebral infarction 12/25/2020   Right hand weakness [id number]   Acute systolic CHF (congestive heart failure) ([place]) 09/01/2020   Acute respiratory failure ([facility])    Mild aortic stenosis 08/30/2020   Elevated troponin 08/30/2020   Pleural effusion on left 08/30/2020   Pain in joint of  left knee 04/10/2020   D-dimer, elevated 01/14/2020   Elevated brain natriuretic peptide (BNP) level 01/14/2020   Advice given about COVID-19 virus infection 07/21/2019   Current chronic use of systemic steroids 07/13/2019   Medication management 04/02/2018   COPD exacerbation ([place]) 03/31/2018   Hypokalemia 03/30/2018   COPD with acute exacerbation ([place]) 03/02/2018   SOB (shortness of breath) 01/12/2018   CKD (chronic kidney disease), stage III 01/12/2018   New onset left bundle branch block (LBBB) 01/12/2018   COPD GOLD I D 01/12/2018   Neurogenic claudication due to lumbar spinal stenosis 09/13/2017   Symptomatic anemia    AVM (arteriovenous malformation) of duodenum, acquired with hemorrhage    Acute GI bleeding 10/09/2016   COPD with emphysema ([place]) 05/10/2016   Recurrent laryngeal neuropathy 02/01/2016   Nausea with vomiting 12/11/2015   Left carotid stenosis 11/21/2015   Asymptomatic carotid artery stenosis 11/13/2015   Impaired glucose tolerance 10/27/2015   Solitary pulmonary nodule 10/27/2015   Thyroid nodule 10/27/2015   Syncope 10/04/2015   Bradycardia with less than 60 beats per minute 10/04/2015   History of embolic stroke [id number]   Paresthesia of both feet 07/06/2014   Aortic arch atherosclerosis ([place]) 06/24/2014   Atherosclerotic ulcer of aorta ([place]) 06/24/2014   Stroke ([place]) 06/22/2014   Bilateral carotid artery disease ([place]) 05/18/2014   TOBACCO USE, QUIT 04/12/2009   DISC DISEASE, LUMBAR  12/16/2008   DIVERTICULOSIS, COLON 09/30/2008   Personal history of colonic polyps 09/30/2008   DYSPNEA 07/13/2008   Weakness 11/09/2007   Hypothyroidism 10/13/2007   Mixed hyperlipidemia 03/06/2007   Essential hypertension 03/06/2007   Osteoarthritis 03/06/2007    Past Surgical History:  Procedure Laterality Date   CARDIAC CATHETERIZATION     CATARACT EXTRACTION Bilateral    CHOLECYSTECTOMY     ENDARTERECTOMY Left 11/13/2015   Procedure: LEFT CAROTID ARTERY  ENDARTERECTOMY;  Surgeon: Conrad Lake Carmel, MD;  Location: [facility] OR;  Service: Vascular;  Laterality: Left;   ESOPHAGOGASTRODUODENOSCOPY (EGD) WITH PROPOFOL N/A 10/11/2016   Procedure: ESOPHAGOGASTRODUODENOSCOPY (EGD) WITH PROPOFOL;  Surgeon: Mauri Pole, MD;  Location: [facility] ENDOSCOPY;  Service: Endoscopy;  Laterality: N/A;   KNEE SURGERY     PATCH ANGIOPLASTY Left 11/13/2015   Procedure: WITH 1CM X 6CM  XENOSURE BIOLOGIC PATCH ANGIOPLASTY;  Surgeon: Conrad New Waverly, MD;  Location: [facility];  Service: Vascular;  Laterality: Left;   TEE WITHOUT CARDIOVERSION N/A 06/24/2014   Procedure: TRANSESOPHAGEAL ECHOCARDIOGRAM (TEE);  Surgeon: Sanda Klein, MD;  Location: [facility] ENDOSCOPY;  Service: Cardiovascular;  Laterality: N/A;     OB History   No obstetric history on file.     Family History  Problem Relation Age of Onset   Stomach cancer Maternal Grandmother    Colon cancer Neg Hx     Social History   Tobacco Use   Smoking status: Former    Packs/day: 1.50    Years: 29.00    Pack years: 43.50    Types: Cigarettes    Start date: 07/08/1949    Quit date: 07/08/1978    Years since quitting: 42.6   Smokeless tobacco: Never  Vaping Use   Vaping Use: Never used  Substance Use Topics   Alcohol use: Yes    Alcohol/week: 1.0 standard drink    Types: 1 Glasses of wine per week    Comment: "red wine"- some nights   Drug use: No    Home Medications Prior to Admission medications   Medication Sig Start Date End Date Taking? Authorizing Provider  albuterol (PROAIR HFA) 108 (90 Base) MCG/ACT inhaler Inhale 2 puffs into the lungs every 6 (six) hours as needed for wheezing or shortness of breath. 11/13/17  Yes Volanda Napoleon, PA-C  ALPRAZolam (XANAX) 0.5 MG tablet Take 1 tablet (0.5 mg total) by mouth 2 (two) times daily. 11/30/20  Yes Nafziger, Tommi Rumps, NP  amLODipine (NORVASC) 5 MG tablet Take 1 tablet (5 mg total) by mouth daily. 12/07/20  Yes Kathyrn Drown D, NP  aspirin EC 81 MG EC tablet Take 1 tablet (81  mg total) by mouth daily. Swallow whole. 12/28/20  Yes Enzo Bi, MD  atorvastatin (LIPITOR) 80 MG tablet TAKE 1 TABLET BY MOUTH EVERY DAY AT 6PM Patient taking differently: Take 80 mg by mouth at bedtime. TAKE 1 TABLET BY MOUTH EVERY DAY AT 6PM 12/07/20  Yes Kathyrn Drown D, NP  Cyanocobalamin (B-12 PO) Take 1 tablet by mouth daily.   Yes [provider]  Fluticasone-Umeclidin-Vilant (TRELEGY ELLIPTA) 100-62.5-25 MCG/INH AEPB INHALE 1 PUFF BY MOUTH EVERY DAY Patient taking differently: Inhale 1 puff into the lungs daily. 01/10/21  Yes Mannam, Praveen, MD  furosemide (LASIX) 20 MG tablet Take 1 tablet (20 mg total) by mouth daily. 12/07/20  Yes Kathyrn Drown D, NP  hydrALAZINE (APRESOLINE) 50 MG tablet Take 1 tablet (50 mg total) by mouth 3 (three) times daily. 12/07/20  Yes Tommie Raymond, NP  ipratropium-albuterol (DUONEB) 0.5-2.5 (3) MG/3ML SOLN Take 3 mLs by nebulization every 6 (six) hours as needed (copd). 01/16/20  Yes Danford, Suann Larry, MD  isosorbide mononitrate (IMDUR) 30 MG 24 hr tablet Take 1 tablet (30 mg total) by mouth daily. 12/07/20  Yes Kathyrn Drown D, NP  latanoprost (XALATAN) 0.005 % ophthalmic solution Place 1 drop into both eyes at bedtime.  04/02/19  Yes [provider]  levothyroxine (SYNTHROID) 75 MCG tablet Take 1 tablet (75 mcg total) by mouth daily before breakfast. 07/04/20  Yes Nafziger, Tommi Rumps, NP  pantoprazole (PROTONIX) 40 MG tablet TAKE 1 TABLET BY MOUTH EVERY DAY Patient taking differently: Take 40 mg by mouth daily. 02/26/21  Yes Nafziger, Tommi Rumps, NP  potassium chloride SA (KLOR-CON) 20 MEQ tablet Take 1 tablet (20 mEq total) by mouth daily. 12/07/20  Yes Kathyrn Drown D, NP  predniSONE (DELTASONE) 10 MG tablet Take 1 tablet (10 mg total) by mouth daily with breakfast. 01/16/21  Yes Mannam, Praveen, MD  ciprofloxacin (CIPRO) 500 MG tablet Take 1 tablet (500 mg total) by mouth 2 (two) times daily. Patient not taking: Reported on 02/26/2021 01/29/21   Laurey Morale, MD    Allergies    Catapres [clonidine hcl], Clonidine, Lisinopril, Labetalol, and Labetalol hcl  Review of Systems   Review of Systems  Constitutional:  Negative for chills, diaphoresis, fatigue and fever.  HENT:  Negative for congestion, rhinorrhea and sneezing.   Eyes:  Positive for visual disturbance.  Respiratory:  Negative for cough, chest tightness and shortness of breath.   Cardiovascular:  Negative for chest pain and leg swelling.  Gastrointestinal:  Negative for abdominal pain, blood in stool, diarrhea, nausea and vomiting.  Genitourinary:  Negative for difficulty urinating, flank pain, frequency and hematuria.  Musculoskeletal:  Negative for arthralgias and back pain.  Skin:  Negative for rash.  Neurological:  Positive for weakness and numbness. Negative for dizziness, speech difficulty and headaches.   Physical Exam Updated Vital Signs BP (!) 163/79 (BP Location: Right Arm)   Pulse 83   Temp 98 F (36.7 C) (Tympanic)   Resp 16   Wt 58.4 kg   SpO2 98%   BMI 20.78 kg/m   Physical Exam Constitutional:      Appearance: She is well-developed.  HENT:     Head: Normocephalic and atraumatic.  Eyes:     Pupils: Pupils are equal, round, and reactive to light.  Cardiovascular:     Rate and Rhythm: Normal rate and regular rhythm.     Heart sounds: Normal heart sounds.  Pulmonary:     Effort: Pulmonary effort is normal. No respiratory distress.     Breath sounds: Normal breath sounds. No wheezing or rales.  Chest:     Chest wall: No tenderness.  Abdominal:     General: Bowel sounds are normal.     Palpations: Abdomen is soft.     Tenderness: There is no abdominal tenderness. There is no guarding or rebound.  Musculoskeletal:        General: Normal range of motion.     Cervical back: Normal range of motion and neck supple.  Lymphadenopathy:     Cervical: No cervical adenopathy.  Skin:    General: Skin is warm and dry.     Findings: No rash.   Neurological:     Mental Status: She is alert and oriented to person, place, and time.     Comments: Some minor weakness of the right arm as compared to  the left.  Some diminished sensation to the right side as compared to the left.  She has some slight weakness in her right leg as compared to the left.  No slurred speech or obvious facial drooping.    ED Results / Procedures / Treatments   Labs (all labs ordered are listed, but only abnormal results are displayed) Labs Reviewed  CBC - Abnormal; Notable for the following components:      Result Value   Hemoglobin 11.9 (*)    All other components within normal limits  COMPREHENSIVE METABOLIC PANEL - Abnormal; Notable for the following components:   Glucose, Bld 170 (*)    Creatinine, Ser 1.32 (*)    Calcium 8.8 (*)    Total Protein 6.0 (*)    GFR, Estimated 38 (*)    All other components within normal limits  I-STAT CHEM 8, ED - Abnormal; Notable for the following components:   Creatinine, Ser 1.20 (*)    Glucose, Bld 169 (*)    Calcium, Ion 1.12 (*)    All other components within normal limits  SARS CORONAVIRUS 2 (TAT 6-24 HRS)  PROTIME-INR  APTT  DIFFERENTIAL  CBG MONITORING, ED    EKG EKG Interpretation  Date/Time:  Monday February 26 2021 14:08:55 EDT Ventricular Rate:  86 PR Interval:  212 QRS Duration: 125 QT Interval:  418 QTC Calculation: 500 R Axis:   -27 Text Interpretation: Sinus rhythm Probable left atrial enlargement Left bundle branch block similar to prior EKG Confirmed by Malvin Johns [phone]) on 02/26/2021 2:27:27 PM  Radiology CT HEAD CODE STROKE WO CONTRAST  Result Date: 02/26/2021 CLINICAL DATA:  Code stroke.  Abnormal sensation right hand EXAM: CT HEAD WITHOUT CONTRAST TECHNIQUE: Contiguous axial images were obtained from the base of the skull through the vertex without intravenous contrast. COMPARISON:  12/23/2020 FINDINGS: Brain: No acute intracranial hemorrhage, mass effect, or edema. No new loss  of gray-white differentiation. Ventricles are stable in size. No extra-axial collection. Vascular: There is intracranial atherosclerotic calcification at the skull base. No hyperdense vessel. Soft tissue along left carotid reflecting known aneurysm. Skull: No new finding. Sinuses/Orbits: No acute abnormality. Other: Persistent patchy right mastoid opacification. ASPECTS ([place] Stroke Program Early CT Score) - Ganglionic level infarction (caudate, lentiform nuclei, internal capsule, insula, M1-M3 cortex): 7 - Supraganglionic infarction (M4-M6 cortex): 3 Total score (0-10 with 10 being normal): 10 IMPRESSION: There is no acute intracranial hemorrhage or evidence of acute infarction. ASPECT score is 10. These results were communicated to Dr. Curly Shores at 1:54 pm on 02/26/2021 by text page via the [facility] messaging system. Electronically Signed   By: Macy Mis M.D.   On: 02/26/2021 13:56   CT ANGIO HEAD NECK W WO CM (CODE STROKE)  Result Date: 02/26/2021 CLINICAL DATA:  Code stroke.  Abnormal sensation in the right hand. EXAM: CT ANGIOGRAPHY HEAD AND NECK TECHNIQUE: Multidetector CT imaging of the head and neck was performed using the standard protocol during bolus administration of intravenous contrast. Multiplanar CT image reconstructions and MIPs were obtained to evaluate the vascular anatomy. Carotid stenosis measurements (when applicable) are obtained utilizing NASCET criteria, using the distal internal carotid diameter as the denominator. CONTRAST:  31m OMNIPAQUE IOHEXOL 350 MG/ML SOLN COMPARISON:  CT head without contrast 02/26/2021. MR head without contrast 12/25/2020. CTA of the head and neck 12/23/2020. FINDINGS: CTA NECK FINDINGS Aortic arch: Marked atherosclerotic disease is present in the aorta. Significant irregular noncalcified plaque is present in the distal arch and proximal descending thoracic  aorta without significant change. Atherosclerotic calcifications are present at the origins the great  vessels without significant stenosis. No aneurysm is present. Right carotid system: Mural calcifications are again noted within the distal right common carotid artery without significant stenosis. Additional calcifications are present in the proximal right ICA without significant stenosis or change. Left carotid system: Atherosclerotic changes are noted in the walls of the distal left common carotid artery. Dilated carotid bulb consistent with previous endarterectomy noted. No residual recurrent stenosis is present. Cervical left ICA is otherwise normal, larger than on the right. Vertebral arteries: The right vertebral artery is the dominant vessel. Atherosclerotic changes are present at the origins of both vertebral arteries without significant stenosis. No significant stenosis is present in either vertebral artery in the neck. Skeleton: Stable multilevel degenerative changes are present. No acute osseous abnormality is present. Other neck: Soft tissues the neck are otherwise unremarkable. Salivary glands are within normal limits. Thyroid is normal. No significant adenopathy is present. No focal mucosal or submucosal lesions are present. Upper chest: Mild centrilobular emphysematous changes are present. A left pleural effusion has increased since the prior study. Scattered ground-glass attenuation is present. No focal nodule or mass lesion is present. Review of the MIP images confirms the above findings CTA HEAD FINDINGS Anterior circulation: The left cavernous ICA aneurysm is stable, measuring 1.7 x 1.4 cm on axial images. There is a wide necked of this aneurysm. Left ICA continues to be of larger caliber than on the right. No significant intracranial stenosis or large vessel occlusion is present. The MCA bifurcations are intact. ACA and MCA branch vessels are unremarkable. Posterior circulation: The right vertebral artery is dominant. Minimal calcifications are present in the V4 segment without significant  stenosis. The hypoplastic left vertebral artery bifurcates at the PICA. Basilar artery is normal. Both posterior cerebral arteries originate from basilar tip. Prominent right posterior communicating artery contributes. The PCA branch vessels are within normal limits bilaterally. Venous sinuses: The dural sinuses are patent. The straight sinus deep cerebral veins are intact. Cortical veins are within normal limits. No significant vascular malformation is evident. Anatomic variants: None Review of the MIP images confirms the above findings IMPRESSION: 1. No emergent large vessel occlusion. 2. Stable 1.7 x 1.4 cm left cavernous ICA aneurysm. 3. No significant proximal stenosis, new aneurysm, or branch vessel occlusion within the Circle of Willis. 4. Prior left carotid endarterectomy without residual or recurrent stenosis. 5. Extensive atherosclerotic changes in the carotid arteries bilaterally without significant stenosis in the neck. 6. Aortic Atherosclerosis (ICD10-I70.0) and Emphysema (ICD10-J43.9). Electronically Signed   By: San Morelle M.D.   On: 02/26/2021 14:14    Procedures Procedures   Medications Ordered in ED Medications  sodium chloride flush (NS) 0.9 % injection 3 mL (3 mLs Intravenous Not Given 02/26/21 1453)  iohexol (OMNIPAQUE) 350 MG/ML injection 60 mL (60 mLs Intravenous Contrast Given 02/26/21 1358)    ED Course  I have reviewed the triage vital signs and the nursing notes.  Pertinent labs & imaging results that were available during my care of the patient were reviewed by me and considered in my medical decision making (see chart for details).    MDM Rules/Calculators/A&P                           Patient is a 87 year old female who presents as a code stroke.  Her CT a/CT show a prior aneurysm no acute findings.  She has been  seen by neurology.  They are awaiting an MRI to make further determinations of her need for admission.  This is given that she has a recent  admission for a full stroke work-up.  She is currently on dual platelet therapy. Final Clinical Impression(s) / ED Diagnoses Final diagnoses:  None    Rx / DC Orders ED Discharge Orders     None        Malvin Johns, MD 02/26/21 1526

## 2021-02-26 NOTE — ED Notes (Signed)
Family updated as to patient's status.

## 2021-02-26 NOTE — ED Triage Notes (Signed)
BIB EMS for right arm weakness and numbness that started today while eating lunch with family around 11:45. NIH 3 at this time. Dry CT head done and CT angio.

## 2021-02-26 NOTE — Discharge Instructions (Addendum)
The Neurologist has cleared you. MRI doesn't show any stroke.  You have severe disk disease and we would like you to see Neurosurgeon as soon as possible.

## 2021-02-26 NOTE — Consult Note (Signed)
Neurology Consultation Reason for Consult: Right sided weakness  Requesting Physician: Varney Biles   CC: Right hand weakness   History is obtained from: Patient, daughter at bedside (POA), and chart review   HPI: [name] is a 87 y.o. female with a past medical history significant for aortic arch atherosclerosis, left carotid artery stenosis s/p CEA, hyperlipidemia, hypertension, remote tobacco use, COPD on chronic prednisone (10 mg daily) , CKD, anxiety (on Xanax as needed, last taken prior to arrival at the hospital), lumbar disc disease, Graves' disease, DNR CODE STATUS  She has had intermittent right-sided symptoms that have been attributed to stroke/TIA.  MRI brain on 12/25/2020 did show punctate foci of acute ischemia in bilateral frontal lobes as well as left occipital lobe as well as a large left ICA aneurysm, though I do not see a clear culprit lesion that would cause right-sided weakness on my review (possibly the punctate left frontal lesion though overall it seems unlikely).  Given her advanced age and comorbidities risks of intervention on the aneurysm have been felt felt to outweigh the benefits.  Additionally there has been some concern for potential occult atrial fibrillation.  However she had a recent 14 day cardiac monitor w/o evidence of Afib week, for which results were called to the patient in early August.  History is largely provided by her daughter who is her power of attorney.  She notes that the patient at baseline lives independently and manages her medications herself.  The patient is very slow to fill her pillbox, but has been filling it correctly when her daughter has checked.  Daughter did note 1 pill dropped on the floor recently which the patient could not identify and was thrown away, but otherwise has no concerns about medication adherence.  Daughter does manage the patient's finances, but notes that the patient has actually been improving since her last  admission for right-sided weakness in June.  She has been working with home PT and OT and has actually been planned to graduate from that in the next week or so.  Otherwise she reports the patient has had chronic urinary leakage and stool incontinence for the past year which she cannot detail the frequency of given the patient does not always report it to her and the patient does sometimes do her own laundry.    She has additionally been having some right leg spasms today (she describes 4-5 spasms but daughter describes spasms lasting 5 to 10 seconds happening every few minutes for at least 20 to 30 minutes, after CTs were complete and before MRI was performed) as well as an episode that woke her from her sleep about a week ago lasting approximately 10 minutes.  And some increased shortness of breath since yesterday.  Daughter does note that the patient has some mild memory impairments on occasion, for example forgetting who brought in her newspaper for her when her regular visiting daughter was out of town.  LKW: 8/21 on further history from family  tPA given?: No, unsecured large ICA aneursym and LKW when clarified  Premorbid modified rankin scale: 0-1     0 - No symptoms.     1 - No significant disability. Able to carry out all usual activities, despite some symptoms.  ROS: All other review of systems was negative except as noted in the HPI.   Past Medical History:  Diagnosis Date   Anxiety    Aortic arch atherosclerosis ([place]) 06/24/2014   Atherosclerotic ulcer of aorta ([place])  06/24/2014   Carotid artery occlusion    Chronic kidney disease    COPD (chronic obstructive pulmonary disease) ([place])    [place] DISEASE, LUMBAR 12/16/2008   DIVERTICULOSIS, COLON 09/30/2008   DYSPNEA 07/13/2008   Graves disease    History of embolic stroke [id number]   Left brain   HYPERLIPIDEMIA 03/06/2007   HYPERTENSION 03/06/2007   HYPOTHYROIDISM 10/13/2007   OSTEOARTHRITIS 03/06/2007   Personal history of colonic polyps  09/30/2008   Stroke ([place])    06/2014            TOBACCO USE, QUIT 04/12/2009   WEAKNESS 11/09/2007   Past Surgical History:  Procedure Laterality Date   CARDIAC CATHETERIZATION     CATARACT EXTRACTION Bilateral    CHOLECYSTECTOMY     ENDARTERECTOMY Left 11/13/2015   Procedure: LEFT CAROTID ARTERY ENDARTERECTOMY;  Surgeon: Conrad Oatfield, MD;  Location: [facility] OR;  Service: Vascular;  Laterality: Left;   ESOPHAGOGASTRODUODENOSCOPY (EGD) WITH PROPOFOL N/A 10/11/2016   Procedure: ESOPHAGOGASTRODUODENOSCOPY (EGD) WITH PROPOFOL;  Surgeon: Mauri Pole, MD;  Location: [facility] ENDOSCOPY;  Service: Endoscopy;  Laterality: N/A;   KNEE SURGERY     PATCH ANGIOPLASTY Left 11/13/2015   Procedure: WITH 1CM X 6CM  XENOSURE BIOLOGIC PATCH ANGIOPLASTY;  Surgeon: Conrad Lebanon, MD;  Location: [facility];  Service: Vascular;  Laterality: Left;   TEE WITHOUT CARDIOVERSION N/A 06/24/2014   Procedure: TRANSESOPHAGEAL ECHOCARDIOGRAM (TEE);  Surgeon: Sanda Klein, MD;  Location: [facility] ENDOSCOPY;  Service: Cardiovascular;  Laterality: N/A;   Current Outpatient Medications  Medication Instructions   albuterol (PROAIR HFA) 108 (90 Base) MCG/ACT inhaler 2 puffs, Inhalation, Every 6 hours PRN   ALPRAZolam (XANAX) 0.5 mg, Oral, 2 times daily   amLODipine (NORVASC) 5 mg, Oral, Daily   aspirin 81 mg, Oral, Daily, Swallow whole.   atorvastatin (LIPITOR) 80 MG tablet TAKE 1 TABLET BY MOUTH EVERY DAY AT 6PM   ciprofloxacin (CIPRO) 500 mg, Oral, 2 times daily   Cyanocobalamin (B-12 PO) 1 tablet, Oral, Daily   Fluticasone-Umeclidin-Vilant (TRELEGY ELLIPTA) 100-62.5-25 MCG/INH AEPB INHALE 1 PUFF BY MOUTH EVERY DAY   furosemide (LASIX) 20 mg, Oral, Daily   hydrALAZINE (APRESOLINE) 50 mg, Oral, 3 times daily   ipratropium-albuterol (DUONEB) 0.5-2.5 (3) MG/3ML SOLN 3 mLs, Nebulization, Every 6 hours PRN   isosorbide mononitrate (IMDUR) 30 mg, Oral, Daily   latanoprost (XALATAN) 0.005 % ophthalmic solution 1 drop, Both Eyes, Daily at bedtime    levothyroxine (SYNTHROID) 75 mcg, Oral, Daily before breakfast   pantoprazole (PROTONIX) 40 MG tablet TAKE 1 TABLET BY MOUTH EVERY DAY   potassium chloride SA (KLOR-CON) 20 MEQ tablet 20 mEq, Oral, Daily   predniSONE (DELTASONE) 10 mg, Oral, Daily with breakfast     Family History  Problem Relation Age of Onset   Stomach cancer Maternal Grandmother    Colon cancer Neg Hx    Social History:  reports that she quit smoking about 42 years ago. Her smoking use included cigarettes. She started smoking about 71 years ago. She has a 43.50 pack-year smoking history. She has never used smokeless tobacco. She reports current alcohol use of about 1.0 standard drink per week. She reports that she does not use drugs.  Exam: Current vital signs: Wt 58.4 kg   BMI 20.78 kg/m  Vital signs in last 24 hours: Weight:  [58.4 kg] 58.4 kg (08/22 1344)   Physical Exam  Constitutional: Appears well-developed and well-nourished.  Psych: Affect appropriate to situation, mildly anxious but cooperative Eyes: No  scleral injection HENT: No oropharyngeal obstruction.  MSK: no joint deformities.  Cardiovascular: Normal rate and regular rhythm.  Respiratory: Effort normal, non-labored breathing GI: Soft.  No distension. There is no tenderness.  Skin: Warm dry and intact visible skin  Neuro: Mental Status: Patient is awake, alert, oriented to person, place, month, year, and situation. Patient is able to give a clear and coherent history. No signs of aphasia or neglect Cranial Nerves: II: Visual Fields are full. Pupils are equal, round, and reactive to light. III,IV, VI: EOMI without ptosis or diploplia on initial evaluation, though she then reported some vertical diplopia to stroke response nurse.  V: Facial sensation is symmetric to temperature VII: Facial movement is notable for the left side of her face not quite elevating as much as the right which is chronic.  VIII: hearing is intact to voice X: Uvula  elevates symmetrically XI: Shoulder shrug is symmetric. XII: tongue is midline without atrophy or fasciculations.  Motor: Tone is normal. Bulk is normal. 5/5 strength was present in all four extremities.  Reevaluation after MRI's were completed, she has improved fine motor coordination of the right upper extremity Sensory: Sensation is symmetric to light touch and temperature in the arms and legs though she notes that her right hand feels subjectively strange Deep Tendon Reflexes: 3+ and symmetric in the biceps and brachioradialis and 2+ and symmetric patellae and Achilles.  Plantars: Toes are downgoing bilaterally.  Cerebellar: FNF and HKS are intact bilaterally Gait: Mildly stooped posture.  Able to rise on heels and toes.  Mildly wide-based and shuffling gait within limits of pure wick in place  NIHSS total 2 Score breakdown: One-point for right arm drift and one-point for right leg drift (left facial droop is chronic and therefore was not scored)    I have reviewed labs in epic and the results pertinent to this consultation are:  Lab Results  Component Value Date   HGBA1C 5.8 (H) 12/25/2020     Lab Results  Component Value Date   CHOL 159 12/25/2020   HDL 74 12/25/2020   LDLCALC 74 12/25/2020   LDLDIRECT 141.7 05/01/2011   TRIG 56 12/25/2020   CHOLHDL 2.1 12/25/2020     I have reviewed the images obtained:  Head CT personally reviewed, no acute intracranial process  CTA personally reviewed, no LVO, again noted L ICA aneursym   Recent ECHO 12/25/2020  1. Left ventricular ejection fraction, by estimation, is 40 to 45%. Left  ventricular ejection fraction by 2D MOD biplane is 42.9 %. The left  ventricle has mildly decreased function. The left ventricle has no  regional wall motion abnormalities. There is  mild left ventricular hypertrophy. Left ventricular diastolic parameters  are consistent with Grade I diastolic dysfunction (impaired relaxation).  Elevated left  ventricular end-diastolic pressure.   2. Right ventricular systolic function is hyperdynamic. The right  ventricular size is normal. There is normal pulmonary artery systolic  pressure. The estimated right ventricular systolic pressure is [id number] mmHg.   3. Left atrial size was mildly dilated.   4. The pericardial effusion is posterior to the left ventricle.   5. The mitral valve is abnormal. Trivial mitral valve regurgitation.   6. The aortic valve is tricuspid. Aortic valve regurgitation is not  visualized. Moderate aortic valve stenosis. Aortic valve area, by VTI  measures 0.98 cm. Aortic valve mean gradient measures 15.7 mmHg. Aortic  valve Vmax measures 2.72 m/s. DI is 0.39.   7. The inferior vena cava is  normal in size with greater than 50%  respiratory variability, suggesting right atrial pressure of 3 mmHg.   Comparison(s): No significant change from prior study. 08/31/2020: LVEF  40-45%, moderate AS - mean gradient 19.5 mmHg.    Initial Recommendations:  # Recurrent right sided weakness concerning for stroke vs. C-spine process  - No need to repeat HgbA1c, fasting lipid panel - MRI brain  - MRI C-spine to rule out spinal pathology contributing to intermittent recurrent right-sided only symptoms - Frequent neuro checks - Echocardiogram completed recently, do not repeat unless c/f new cardiac dysfunction arises  - Continue DAPT indefinitely, at least 3 additional months from today - Risk factor modification - Telemetry monitoring; consider loop monitor if patient would consider AC if this was positive for Afib  - Blood pressure goal   - Permissive hypertension to 220/120 due to c/f acute stroke, if MRI is negative would gradually normalize - PT consult, OT consult, Speech consult - Work-up of shortness of breath per ED ----------  Addendum after imaging reviewed:   MRI brain without acute intracranial pathology, though on this imaging anterior translation of the C1 ring  relative to C2 with moderate to severe canal stenosis at the level of the dens and mass-effect on the cord is identified.  Axial images are insufficient to assess for cord signal change although it appears to be slightly worsened since 2017 per radiology.  She additionally does have multilevel degenerative changes throughout the cervical spine, somewhat worse on the left side compared to the right.  MRI brain full radiology impression 1. No acute intracranial pathology. 2. Unchanged global parenchymal volume loss and chronic white matter microangiopathy. 3. Right mastoid effusion, present since at least 12/25/2020. 4. The left ICA aneurysm is better evaluated on the same day CTA head. MRI C-spine full radiology impression 1. Anterior translation of the C1 ring relative to C2 with the posterior ring anteriorly subluxed into the canal. This results in moderate to severe canal stenosis at the level of the dens with mass effect on the cord. No definite cord signal abnormality identified. The stenosis appears slightly worsened compared to the brain MRI and CT a neck from 11/21/2015. 2. Advanced multilevel degenerative changes throughout the remainder of the cervical spine detailed above resulting in mild-to-moderate spinal canal stenosis at C5-C6 and severe neural foraminal stenosis at numerous levels.  Impression: This is a 87 year old woman with past medical history significant for multiple stroke risk factors as above.  She presents with another episode of mild right upper extremity motor incoordination.  No acute abnormality on MRI despite symptoms lasting several hours, and having happened even the day prior, which lowers my concern for acute stroke/TIA, especially as this is recurrent from her event in June.  I am concerned about the cervical spine stenosis at C1 though given her excellent function at this time and her advanced age and comorbidities doubt that the risks of an intervention would  outweigh the benefit, defer to neurosurgical evaluation.  Given the patient has been stable, neurosurgical evaluation can be completed on an outpatient basis.  Additionally family is concerned about the possibility of a recurrent UTI and therefore I have ordered the UA to expedite the work-up of this.  We did discuss continuing Plavix again for another 21-day to 90-day course but the patient declined this due to her prior bleeding issues  In summary: -Close outpatient neurosurgery follow-up -Close outpatient neurology follow-up -Follow-up UA per ED   Lesleigh Noe MD-PhD [facility] [phone]  Available 7 AM to 7 PM, outside these hours please contact Neurologist on call listed on AMION   Total critical care time: 45 minutes   Critical care time was exclusive of separately billable procedures and treating other patients.   Critical care was necessary to treat or prevent imminent or life-threatening deterioration; patient presented with an acute neurological change within window for emergent interventions such as tPA and intra-arterial thrombectomy for which she was rapidly evaluated   Critical care was time spent personally by me on the following activities: development of treatment plan with patient and/or surrogate as well as nursing, discussions with consultants/primary team, evaluation of patient's response to treatment, examination of patient, obtaining history from patient or surrogate, ordering and performing treatments and interventions, ordering and review of laboratory studies, ordering and review of radiographic studies, and re-evaluation of patient's condition as needed, as documented above.

## 2021-02-26 NOTE — Code Documentation (Signed)
Stroke Response Nurse Documentation Code Documentation  [name] is a 87 y.o. female arriving to [facility] ED via [facility] on 02/26/21 with past medical hx of HLD, HTN, stroke, anxiety, CKD. Code stroke was activated by EMS. Patient with chronic left facial droop, from home with a caregiver where she was LKW at 1244 and now complaining of right sided weakness and decreased sensation in right hand.   On aspirin 81 mg daily. Stroke team at the bedside on patient arrival. Labs drawn and patient cleared for CT by Dr. Florene Glen. Patient to CT with team. NIHSS 5, see documentation for details and code stroke times. Patient with left facial droop, right arm weakness, right leg weakness, right decreased sensation, and dysarthria  on exam. The following imaging was completed: CT, CTA head and neck. Patient is not a candidate for tPA due to unsecured aneurysm found on CTA.  Care/Plan: q2h vitals and mNIHSS. Bedside handoff with ED RN Mitzi Hansen.    Candace Cruise K  Stroke Response RN

## 2021-02-27 ENCOUNTER — Emergency Department (HOSPITAL_COMMUNITY): Payer: Medicare PPO

## 2021-02-27 ENCOUNTER — Observation Stay (HOSPITAL_COMMUNITY)
Admission: EM | Admit: 2021-02-27 | Discharge: 2021-02-28 | Disposition: A | Discharge status: Home / Self Care | Payer: Medicare PPO | Attending: Family Medicine | Admitting: Family Medicine

## 2021-02-27 ENCOUNTER — Encounter (HOSPITAL_COMMUNITY): Payer: Self-pay | Admitting: Family Medicine

## 2021-02-27 DIAGNOSIS — R531 Weakness: Secondary | ICD-10-CM

## 2021-02-27 DIAGNOSIS — R0602 Shortness of breath: Secondary | ICD-10-CM | POA: Diagnosis not present

## 2021-02-27 DIAGNOSIS — J439 Emphysema, unspecified: Secondary | ICD-10-CM | POA: Diagnosis not present

## 2021-02-27 DIAGNOSIS — Z7982 Long term (current) use of aspirin: Secondary | ICD-10-CM | POA: Insufficient documentation

## 2021-02-27 DIAGNOSIS — I671 Cerebral aneurysm, nonruptured: Secondary | ICD-10-CM | POA: Diagnosis not present

## 2021-02-27 DIAGNOSIS — I10 Essential (primary) hypertension: Secondary | ICD-10-CM | POA: Diagnosis not present

## 2021-02-27 DIAGNOSIS — I69351 Hemiplegia and hemiparesis following cerebral infarction affecting right dominant side: Secondary | ICD-10-CM | POA: Diagnosis not present

## 2021-02-27 DIAGNOSIS — J449 Chronic obstructive pulmonary disease, unspecified: Secondary | ICD-10-CM | POA: Insufficient documentation

## 2021-02-27 DIAGNOSIS — E039 Hypothyroidism, unspecified: Secondary | ICD-10-CM | POA: Diagnosis present

## 2021-02-27 DIAGNOSIS — I350 Nonrheumatic aortic (valve) stenosis: Secondary | ICD-10-CM | POA: Diagnosis not present

## 2021-02-27 DIAGNOSIS — I5021 Acute systolic (congestive) heart failure: Secondary | ICD-10-CM | POA: Diagnosis not present

## 2021-02-27 DIAGNOSIS — N1832 Chronic kidney disease, stage 3b: Secondary | ICD-10-CM | POA: Diagnosis present

## 2021-02-27 DIAGNOSIS — Z79899 Other long term (current) drug therapy: Secondary | ICD-10-CM | POA: Insufficient documentation

## 2021-02-27 DIAGNOSIS — I130 Hypertensive heart and chronic kidney disease with heart failure and stage 1 through stage 4 chronic kidney disease, or unspecified chronic kidney disease: Secondary | ICD-10-CM | POA: Diagnosis not present

## 2021-02-27 DIAGNOSIS — I5042 Chronic combined systolic (congestive) and diastolic (congestive) heart failure: Secondary | ICD-10-CM | POA: Diagnosis present

## 2021-02-27 DIAGNOSIS — Z8673 Personal history of transient ischemic attack (TIA), and cerebral infarction without residual deficits: Secondary | ICD-10-CM

## 2021-02-27 DIAGNOSIS — E782 Mixed hyperlipidemia: Secondary | ICD-10-CM | POA: Diagnosis present

## 2021-02-27 DIAGNOSIS — I959 Hypotension, unspecified: Secondary | ICD-10-CM | POA: Diagnosis not present

## 2021-02-27 DIAGNOSIS — R55 Syncope and collapse: Secondary | ICD-10-CM | POA: Diagnosis not present

## 2021-02-27 DIAGNOSIS — I779 Disorder of arteries and arterioles, unspecified: Secondary | ICD-10-CM | POA: Diagnosis present

## 2021-02-27 DIAGNOSIS — D631 Anemia in chronic kidney disease: Secondary | ICD-10-CM | POA: Diagnosis not present

## 2021-02-27 DIAGNOSIS — J90 Pleural effusion, not elsewhere classified: Secondary | ICD-10-CM | POA: Diagnosis not present

## 2021-02-27 DIAGNOSIS — I447 Left bundle-branch block, unspecified: Secondary | ICD-10-CM | POA: Diagnosis not present

## 2021-02-27 DIAGNOSIS — Z87891 Personal history of nicotine dependence: Secondary | ICD-10-CM | POA: Insufficient documentation

## 2021-02-27 DIAGNOSIS — M4802 Spinal stenosis, cervical region: Secondary | ICD-10-CM | POA: Diagnosis present

## 2021-02-27 DIAGNOSIS — I700 Atherosclerosis of aorta: Secondary | ICD-10-CM | POA: Diagnosis not present

## 2021-02-27 DIAGNOSIS — N183 Chronic kidney disease, stage 3 unspecified: Secondary | ICD-10-CM | POA: Diagnosis not present

## 2021-02-27 DIAGNOSIS — Z20822 Contact with and (suspected) exposure to covid-19: Secondary | ICD-10-CM | POA: Insufficient documentation

## 2021-02-27 DIAGNOSIS — I5022 Chronic systolic (congestive) heart failure: Secondary | ICD-10-CM | POA: Diagnosis not present

## 2021-02-27 DIAGNOSIS — R52 Pain, unspecified: Secondary | ICD-10-CM | POA: Diagnosis not present

## 2021-02-27 LAB — CK: Total CK: 98 U/L (ref 38–234)

## 2021-02-27 LAB — SARS CORONAVIRUS 2 (TAT 6-24 HRS): SARS Coronavirus 2: NEGATIVE

## 2021-02-27 LAB — BASIC METABOLIC PANEL
Anion gap: 6 (ref 5–15)
BUN: 21 mg/dL (ref 8–23)
CO2: 30 mmol/L (ref 22–32)
Calcium: 8.8 mg/dL — ABNORMAL LOW (ref 8.9–10.3)
Chloride: 104 mmol/L (ref 98–111)
Creatinine, Ser: 1.43 mg/dL — ABNORMAL HIGH (ref 0.44–1.00)
GFR, Estimated: 34 mL/min — ABNORMAL LOW (ref >60)
Glucose, Bld: 107 mg/dL — ABNORMAL HIGH (ref 70–99)
Potassium: 3.6 mmol/L (ref 3.5–5.1)
Sodium: 140 mmol/L (ref 135–145)

## 2021-02-27 LAB — CBC WITH DIFFERENTIAL/PLATELET
Abs Immature Granulocytes: 0.01 10*3/uL (ref 0.00–0.07)
Basophils Absolute: 0.1 10*3/uL (ref 0.0–0.1)
Basophils Relative: 1 %
Eosinophils Absolute: 0.2 10*3/uL (ref 0.0–0.5)
Eosinophils Relative: 4 %
HCT: 35.9 % — ABNORMAL LOW (ref 36.0–46.0)
Hemoglobin: 11.1 g/dL — ABNORMAL LOW (ref 12.0–15.0)
Immature Granulocytes: 0 %
Lymphocytes Relative: 17 %
Lymphs Abs: 0.9 10*3/uL (ref 0.7–4.0)
MCH: 29.4 pg (ref 26.0–34.0)
MCHC: 30.9 g/dL (ref 30.0–36.0)
MCV: 95 fL (ref 80.0–100.0)
Monocytes Absolute: 0.9 10*3/uL (ref 0.1–1.0)
Monocytes Relative: 18 %
Neutro Abs: 3.1 10*3/uL (ref 1.7–7.7)
Neutrophils Relative %: 60 %
Platelets: 165 10*3/uL (ref 150–400)
RBC: 3.78 MIL/uL — ABNORMAL LOW (ref 3.87–5.11)
RDW: 13.2 % (ref 11.5–15.5)
WBC: 5.2 10*3/uL (ref 4.0–10.5)
nRBC: 0 % (ref 0.0–0.2)

## 2021-02-27 LAB — MAGNESIUM: Magnesium: 2.3 mg/dL (ref 1.7–2.4)

## 2021-02-27 LAB — TROPONIN I (HIGH SENSITIVITY)
Troponin I (High Sensitivity): 24 ng/L — ABNORMAL HIGH (ref <18)
Troponin I (High Sensitivity): 22 ng/L — ABNORMAL HIGH (ref <18)

## 2021-02-27 MED ORDER — ASPIRIN EC 81 MG PO TBEC
81.0000 mg | DELAYED_RELEASE_TABLET | Freq: Every day | ORAL | Status: DC
  Administered 2021-02-27: 81 mg via ORAL
  Filled 2021-02-27: qty 1

## 2021-02-27 MED ORDER — SODIUM CHLORIDE 0.9 % IV SOLN: Freq: Once | INTRAVENOUS | Status: DC

## 2021-02-27 MED ORDER — ISOSORBIDE MONONITRATE ER 30 MG PO TB24: 30.0000 mg | ORAL_TABLET | Freq: Every day | ORAL | Status: DC

## 2021-02-27 MED ORDER — LEVOTHYROXINE SODIUM 75 MCG PO TABS
75.0000 ug | ORAL_TABLET | Freq: Every day | ORAL | Status: DC
  Administered 2021-02-28: 75 ug via ORAL
  Filled 2021-02-27: qty 1

## 2021-02-27 MED ORDER — FLUTICASONE-UMECLIDIN-VILANT 100-62.5-25 MCG/INH IN AEPB: 1.0000 | INHALATION_SPRAY | Freq: Every day | RESPIRATORY_TRACT | Status: DC

## 2021-02-27 MED ORDER — SODIUM CHLORIDE 0.9 % IV SOLN: 250.0000 mL | INTRAVENOUS | Status: DC | PRN

## 2021-02-27 MED ORDER — ENOXAPARIN SODIUM 30 MG/0.3ML IJ SOSY
30.0000 mg | PREFILLED_SYRINGE | INTRAMUSCULAR | Status: DC
  Administered 2021-02-27: 30 mg via SUBCUTANEOUS
  Filled 2021-02-27: qty 0.3

## 2021-02-27 MED ORDER — ALPRAZOLAM 0.5 MG PO TABS
0.5000 mg | ORAL_TABLET | Freq: Three times a day (TID) | ORAL | Status: DC
  Administered 2021-02-27 – 2021-02-28 (×2): 0.5 mg via ORAL
  Filled 2021-02-27 (×2): qty 1

## 2021-02-27 MED ORDER — ACETAMINOPHEN 650 MG RE SUPP: 650.0000 mg | Freq: Four times a day (QID) | RECTAL | Status: DC | PRN

## 2021-02-27 MED ORDER — FUROSEMIDE 20 MG PO TABS
20.0000 mg | ORAL_TABLET | Freq: Every day | ORAL | Status: DC
  Administered 2021-02-28: 20 mg via ORAL
  Filled 2021-02-27: qty 1

## 2021-02-27 MED ORDER — ATORVASTATIN CALCIUM 80 MG PO TABS
80.0000 mg | ORAL_TABLET | Freq: Every day | ORAL | Status: DC
  Administered 2021-02-27: 80 mg via ORAL
  Filled 2021-02-27: qty 1

## 2021-02-27 MED ORDER — IPRATROPIUM-ALBUTEROL 0.5-2.5 (3) MG/3ML IN SOLN: 3.0000 mL | Freq: Four times a day (QID) | RESPIRATORY_TRACT | Status: DC | PRN

## 2021-02-27 MED ORDER — SODIUM CHLORIDE 0.9% FLUSH: 3.0000 mL | INTRAVENOUS | Status: DC | PRN

## 2021-02-27 MED ORDER — POTASSIUM CHLORIDE CRYS ER 20 MEQ PO TBCR
20.0000 meq | EXTENDED_RELEASE_TABLET | Freq: Every day | ORAL | Status: DC
  Administered 2021-02-28: 20 meq via ORAL
  Filled 2021-02-27: qty 1

## 2021-02-27 MED ORDER — ACETAMINOPHEN 325 MG PO TABS: 650.0000 mg | ORAL_TABLET | Freq: Four times a day (QID) | ORAL | Status: DC | PRN

## 2021-02-27 MED ORDER — AMLODIPINE BESYLATE 5 MG PO TABS
5.0000 mg | ORAL_TABLET | Freq: Every day | ORAL | Status: DC
  Administered 2021-02-27: 5 mg via ORAL
  Filled 2021-02-27: qty 1

## 2021-02-27 MED ORDER — PANTOPRAZOLE SODIUM 40 MG PO TBEC
40.0000 mg | DELAYED_RELEASE_TABLET | Freq: Every day | ORAL | Status: DC
  Administered 2021-02-27: 40 mg via ORAL
  Filled 2021-02-27: qty 1

## 2021-02-27 MED ORDER — LATANOPROST 0.005 % OP SOLN
1.0000 [drp] | Freq: Every day | OPHTHALMIC | Status: DC
  Administered 2021-02-27: 1 [drp] via OPHTHALMIC
  Filled 2021-02-27: qty 2.5

## 2021-02-27 MED ORDER — SODIUM CHLORIDE 0.9 % IV BOLUS
250.0000 mL | Freq: Once | INTRAVENOUS | Status: AC
  Administered 2021-02-27: 250 mL via INTRAVENOUS

## 2021-02-27 MED ORDER — SODIUM CHLORIDE 0.9% FLUSH
3.0000 mL | Freq: Two times a day (BID) | INTRAVENOUS | Status: DC
  Administered 2021-02-27 (×2): 3 mL via INTRAVENOUS

## 2021-02-27 MED ORDER — ALBUTEROL SULFATE (2.5 MG/3ML) 0.083% IN NEBU: 3.0000 mL | INHALATION_SOLUTION | Freq: Four times a day (QID) | RESPIRATORY_TRACT | Status: DC | PRN

## 2021-02-27 MED ORDER — PREDNISONE 10 MG PO TABS
10.0000 mg | ORAL_TABLET | Freq: Every day | ORAL | Status: DC
  Administered 2021-02-28: 10 mg via ORAL
  Filled 2021-02-27: qty 1

## 2021-02-27 MED ORDER — FLUTICASONE FUROATE-VILANTEROL 100-25 MCG/INH IN AEPB
1.0000 | INHALATION_SPRAY | Freq: Every day | RESPIRATORY_TRACT | Status: DC
  Administered 2021-02-28: 1 via RESPIRATORY_TRACT
  Filled 2021-02-27: qty 28

## 2021-02-27 MED ORDER — UMECLIDINIUM BROMIDE 62.5 MCG/INH IN AEPB
1.0000 | INHALATION_SPRAY | Freq: Every day | RESPIRATORY_TRACT | Status: DC
  Administered 2021-02-28: 1 via RESPIRATORY_TRACT
  Filled 2021-02-27: qty 7

## 2021-02-27 MED ORDER — HYDRALAZINE HCL 50 MG PO TABS
50.0000 mg | ORAL_TABLET | Freq: Three times a day (TID) | ORAL | Status: DC
  Administered 2021-02-27 – 2021-02-28 (×2): 50 mg via ORAL
  Filled 2021-02-27 (×2): qty 1

## 2021-02-27 NOTE — ED Triage Notes (Addendum)
Pt comes from home via EMS. Daughter reports she was sitting at the table, eyes rolled back and she stopped breathing, no CPR initiated. Upon EMS arrival pt was breathing at baseline. Pt reports that she doesn't feel well but cannot define it more specifically. Pt was seen here yesterday stroke symptoms, no treatment due hx brain aneurysm. Pt is DNR, paperwork in room. Pt a/o x4 per EMS upon arrival to ED. Pt refused IV access. Daughter on her way here. BP 122/50 HR 63 RR 20 O2 94% RA CBG 233  Pt reports that she does not remember event today, she remembers getting up at 0700, eating a bowl of oatmeal and taking her morning mediation. She reports that she felt sob so took a whole xanax to help her relax and help with her breathing. She reports that usually only takes 1/2 of a xanax. She reports that she felt she was having a hard time taking after eating breakfast and after xanax but does not remember syncopal event.

## 2021-02-27 NOTE — Consult Note (Signed)
Cardiology Consultation:   Patient ID: [name] MRN: [id number]; DOB: 1925-01-25  Admit date: 02/27/2021 Date of Consult: 02/27/2021  PCP:  [name], NP   [facility] Providers Cardiologist:  Skeet Latch, MD        Patient Profile:   [name] is a 87 y.o. female with a hx of multiple medical problems who is being seen 02/27/2021 for the evaluation of syncope at the request of Dr Rogers Blocker.   History of Present Illness:   [name] is a 87 year old woman with past [facility] medical history of stroke, recurrent TIA, chronic kidney disease, hypertension, COPD on chronic steroids, bilateral carotid arterial disease status post left carotid endarterectomy and left ICA aneurysm, and moderate aortic stenosis who presents with syncope.  The patient's daughter is at the bedside and assists with medical history.  The patient is quite sharp and also able to provide an accurate history.  She has had frequent episodes of shortness of breath and seems to respond well to taking Xanax.  This occurred earlier today and her symptoms improved after a Xanax.  She was then sitting at the breakfast table and had eaten some food.  Her medicines were taken about 30 minutes beforehand.  She started to "not feel well."  She became hot and then her daughter describes the patient's eyes rolling back in her head and she suddenly lost consciousness.  Her breathing was abnormal as well.  EMS was called and the daughter reports that her mother was essentially unresponsive for about 4 minutes.  She did not fall to the floor but rather stayed in a chair with her head back.  She did not lose bowel or bladder function.  The patient is now feeling okay and denies any further symptoms.  The patient was just seen yesterday in the emergency room with right arm weakness and double vision.  She was seen by neurology and studies did not support a diagnosis of an acute stroke.  She had an EEG today that was within normal  limits.  A recent outpatient heart monitor was completed and showed multiple short runs of PSVT with the longest run of 18 beats, 5.7% supraventricular ectopics, and rare ventricular ectopics.  There was no atrial fibrillation or flutter identified.  There has been some discussion in the outpatient setting about an implantable loop recorder.   Past Medical History:  Diagnosis Date   Anxiety    Aortic arch atherosclerosis ([place]) 06/24/2014   Atherosclerotic ulcer of aorta ([place]) 06/24/2014   Carotid artery occlusion    Chronic kidney disease    COPD (chronic obstructive pulmonary disease) ([place])    [place] DISEASE, LUMBAR 12/16/2008   DIVERTICULOSIS, COLON 09/30/2008   DYSPNEA 07/13/2008   Graves disease    History of embolic stroke [id number]   Left brain   HYPERLIPIDEMIA 03/06/2007   HYPERTENSION 03/06/2007   HYPOTHYROIDISM 10/13/2007   OSTEOARTHRITIS 03/06/2007   Personal history of colonic polyps 09/30/2008   Stroke ([place])    06/2014            TOBACCO USE, QUIT 04/12/2009   WEAKNESS 11/09/2007    Past Surgical History:  Procedure Laterality Date   CARDIAC CATHETERIZATION     CATARACT EXTRACTION Bilateral    CHOLECYSTECTOMY     ENDARTERECTOMY Left 11/13/2015   Procedure: LEFT CAROTID ARTERY ENDARTERECTOMY;  Surgeon: Conrad Fulton, MD;  Location: [facility];  Service: Vascular;  Laterality: Left;   ESOPHAGOGASTRODUODENOSCOPY (EGD) WITH PROPOFOL N/A 10/11/2016   Procedure:  ESOPHAGOGASTRODUODENOSCOPY (EGD) WITH PROPOFOL;  Surgeon: Mauri Pole, MD;  Location: [facility] ENDOSCOPY;  Service: Endoscopy;  Laterality: N/A;   KNEE SURGERY     PATCH ANGIOPLASTY Left 11/13/2015   Procedure: WITH 1CM X 6CM  XENOSURE BIOLOGIC PATCH ANGIOPLASTY;  Surgeon: Conrad Decatur, MD;  [facility];  Service: Vascular;  Laterality: Left;   TEE WITHOUT CARDIOVERSION N/A 06/24/2014   Procedure: TRANSESOPHAGEAL ECHOCARDIOGRAM (TEE);  Surgeon: Sanda Klein, MD;  Location: [facility] ENDOSCOPY;  Service: Cardiovascular;  Laterality: N/A;      Home Medications:  Prior to Admission medications   Medication Sig Start Date End Date Taking? Authorizing Provider  albuterol (PROAIR HFA) 108 (90 Base) MCG/ACT inhaler Inhale 2 puffs into the lungs every 6 (six) hours as needed for wheezing or shortness of breath. 11/13/17  Yes Volanda Napoleon, PA-C  ALPRAZolam (XANAX) 0.5 MG tablet Take 1 tablet (0.5 mg total) by mouth 2 (two) times daily. Patient taking differently: Take 0.5 mg by mouth 3 (three) times daily. 11/30/20  Yes Nafziger, Tommi Rumps, NP  amLODipine (NORVASC) 5 MG tablet Take 1 tablet (5 mg total) by mouth daily. 12/07/20  Yes Kathyrn Drown D, NP  aspirin EC 81 MG EC tablet Take 1 tablet (81 mg total) by mouth daily. Swallow whole. Patient taking differently: Take 81 mg by mouth at bedtime. Swallow whole. 12/28/20  Yes Enzo Bi, MD  atorvastatin (LIPITOR) 80 MG tablet TAKE 1 TABLET BY MOUTH EVERY DAY AT 6PM Patient taking differently: Take 80 mg by mouth at bedtime. TAKE 1 TABLET BY MOUTH EVERY DAY AT 6PM 12/07/20  Yes Kathyrn Drown D, NP  Cyanocobalamin (B-12 PO) Take 1 tablet by mouth daily.   Yes [provider]  Fluticasone-Umeclidin-Vilant (TRELEGY ELLIPTA) 100-62.5-25 MCG/INH AEPB INHALE 1 PUFF BY MOUTH EVERY DAY Patient taking differently: Inhale 1 puff into the lungs daily. 01/10/21  Yes Mannam, Praveen, MD  furosemide (LASIX) 20 MG tablet Take 1 tablet (20 mg total) by mouth daily. 12/07/20  Yes Kathyrn Drown D, NP  hydrALAZINE (APRESOLINE) 50 MG tablet Take 1 tablet (50 mg total) by mouth 3 (three) times daily. 12/07/20  Yes Kathyrn Drown D, NP  ipratropium-albuterol (DUONEB) 0.5-2.5 (3) MG/3ML SOLN Take 3 mLs by nebulization every 6 (six) hours as needed (copd). 01/16/20  Yes Danford, Suann Larry, MD  isosorbide mononitrate (IMDUR) 30 MG 24 hr tablet Take 1 tablet (30 mg total) by mouth daily. 12/07/20  Yes Kathyrn Drown D, NP  latanoprost (XALATAN) 0.005 % ophthalmic solution Place 1 drop into both eyes at bedtime.   04/02/19  Yes [provider]  levothyroxine (SYNTHROID) 75 MCG tablet Take 1 tablet (75 mcg total) by mouth daily before breakfast. 07/04/20  Yes Nafziger, Tommi Rumps, NP  pantoprazole (PROTONIX) 40 MG tablet TAKE 1 TABLET BY MOUTH EVERY DAY Patient taking differently: Take 40 mg by mouth daily. 02/26/21  Yes Nafziger, Tommi Rumps, NP  potassium chloride SA (KLOR-CON) 20 MEQ tablet Take 1 tablet (20 mEq total) by mouth daily. 12/07/20  Yes Kathyrn Drown D, NP  predniSONE (DELTASONE) 10 MG tablet Take 1 tablet (10 mg total) by mouth daily with breakfast. 01/16/21  Yes Mannam, Praveen, MD  ciprofloxacin (CIPRO) 500 MG tablet Take 1 tablet (500 mg total) by mouth 2 (two) times daily. Patient not taking: Reported on 02/26/2021 01/29/21   Laurey Morale, MD    Inpatient Medications: Scheduled Meds:  ALPRAZolam  0.5 mg Oral TID   amLODipine  5 mg Oral Daily  aspirin  81 mg Oral QHS   atorvastatin  80 mg Oral QHS   enoxaparin (LOVENOX) injection  30 mg Subcutaneous Q24H   [START ON 02/28/2021] Fluticasone-Umeclidin-Vilant  1 puff Inhalation Daily   [START ON 02/28/2021] furosemide  20 mg Oral Daily   hydrALAZINE  50 mg Oral TID   [START ON 02/28/2021] isosorbide mononitrate  30 mg Oral Daily   latanoprost  1 drop Both Eyes QHS   [START ON 02/28/2021] levothyroxine  75 mcg Oral QAC breakfast   pantoprazole  40 mg Oral Daily   [START ON 02/28/2021] potassium chloride SA  20 mEq Oral Daily   [START ON 02/28/2021] predniSONE  10 mg Oral Q breakfast   sodium chloride flush  3 mL Intravenous Q12H   Continuous Infusions:  sodium chloride     PRN Meds: sodium chloride, acetaminophen **OR** acetaminophen, albuterol, ipratropium-albuterol, sodium chloride flush  Allergies:    Allergies  Allergen Reactions   Catapres [Clonidine Hcl] Other (See Comments)    Made pt feel horrible, shaky, weak and nausea   Clonidine Other (See Comments)    Made pt feel horrible, shaky, weak and nausea   Lisinopril Anaphylaxis     Tongue swelling   Labetalol Other (See Comments)    Caused bradycardia and syncope   Labetalol Hcl Other (See Comments)    Caused bradycardia and syncope    Social History:   Social History   Socioeconomic History   Marital status: Divorced    Spouse name: Not on file   Number of children: 4   Years of education: college   Highest education level: Not on file  Occupational History   Occupation: retired  Tobacco Use   Smoking status: Former    Packs/day: 1.50    Years: 29.00    Pack years: 43.50    Types: Cigarettes    Start date: 07/08/1949    Quit date: 07/08/1978    Years since quitting: 42.6   Smokeless tobacco: Never  Vaping Use   Vaping Use: Never used  Substance and Sexual Activity   Alcohol use: Yes    Alcohol/week: 1.0 standard drink    Types: 1 Glasses of wine per week    Comment: "red wine"- some nights   Drug use: No   Sexual activity: Not on file  Other Topics Concern   Not on file  Social History Narrative      Patient is right handed.   Patient drinks 1 cup caffeine daily.   Social Determinants of Health   Financial Resource Strain: Not on file  Food Insecurity: Not on file  Transportation Needs: Not on file  Physical Activity: Not on file  Stress: Not on file  Social Connections: Not on file  Intimate Partner Violence: Not on file    Family History:   Family History  Problem Relation Age of Onset   Stomach cancer Maternal Grandmother    Colon cancer Neg Hx      ROS:  Please see the history of present illness.  All other ROS reviewed and negative.     Physical Exam/Data:   Vitals:   02/27/21 1330 02/27/21 1600 02/27/21 1700 02/27/21 1817  BP: (!) 152/64 (!) 175/69 (!) 160/72 (!) 191/86  Pulse: 61 60 63 76  Resp: '15 15 15 20  '$ Temp:  (!) 97.3 F (36.3 C)  98.1 F (36.7 C)  TempSrc:  Oral  Oral  SpO2: 97% 94% 96% 96%   No intake or output data in  the 24 hours ending 02/27/21 1832 Last 3 Weights 02/26/2021 01/22/2021 12/23/2020   Weight (lbs) 128 lb 12 oz 127 lb 129 lb 9.6 oz  Weight (kg) 58.4 kg 57.607 kg 58.786 kg     There is no height or weight on file to calculate BMI.  General: Elderly woman no distress HEENT: normal Lymph: no adenopathy Neck: no JVD Endocrine:  No thryomegaly Vascular: Bilateral carotid bruits Cardiac:  normal S1, S2; RRR; 3/6 harsh crescendo decrescendo murmur at the right upper sternal border, mid peaking, diminished A2 Lungs: Scattered rhonchi Abd: soft, nontender, no hepatomegaly  Ext: no edema Musculoskeletal:  No deformities, BUE and BLE strength normal and equal Skin: Diffuse ecchymoses, thin skin Neuro:  CNs 2-12 intact, no focal abnormalities noted Psych:  Normal affect   EKG:  The EKG was personally reviewed and demonstrates: Sinus rhythm with LVH, heart rate 62 bpm, PACs  Telemetry:  Telemetry was personally reviewed and demonstrates: Sinus rhythm with frequent PACs  Relevant CV Studies: Echocardiogram 12/25/2020: IMPRESSIONS     1. Left ventricular ejection fraction, by estimation, is 40 to 45%. Left  ventricular ejection fraction by 2D MOD biplane is 42.9 %. The left  ventricle has mildly decreased function. The left ventricle has no  regional wall motion abnormalities. There is  mild left ventricular hypertrophy. Left ventricular diastolic parameters  are consistent with Grade I diastolic dysfunction (impaired relaxation).  Elevated left ventricular end-diastolic pressure.   2. Right ventricular systolic function is hyperdynamic. The right  ventricular size is normal. There is normal pulmonary artery systolic  pressure. The estimated right ventricular systolic pressure is [id number] mmHg.   3. Left atrial size was mildly dilated.   4. The pericardial effusion is posterior to the left ventricle.   5. The mitral valve is abnormal. Trivial mitral valve regurgitation.   6. The aortic valve is tricuspid. Aortic valve regurgitation is not  visualized. Moderate aortic valve  stenosis. Aortic valve area, by VTI  measures 0.98 cm. Aortic valve mean gradient measures 15.7 mmHg. Aortic  valve Vmax measures 2.72 m/s. DI is 0.39.   7. The inferior vena cava is normal in size with greater than 50%  respiratory variability, suggesting right atrial pressure of 3 mmHg.   Laboratory Data:  High Sensitivity Troponin:   Recent Labs  Lab 02/27/21 0958 02/27/21 1248  TROPONINIHS 24* 22*     Chemistry Recent Labs  Lab 02/26/21 1339 02/26/21 1344 02/27/21 0958  NA 137 138 140  K 4.0 3.9 3.6  CL 104 102 104  CO2 25  --  30  GLUCOSE 170* 169* 107*  BUN '19 21 21  '$ CREATININE 1.32* 1.20* 1.43*  CALCIUM 8.8*  --  8.8*  GFRNONAA 38*  --  34*  ANIONGAP 8  --  6    Recent Labs  Lab 02/26/21 1339  PROT 6.0*  ALBUMIN 3.6  AST 20  ALT 17  ALKPHOS 95  BILITOT 0.9   Hematology Recent Labs  Lab 02/26/21 1339 02/26/21 1344 02/27/21 0958  WBC 6.4  --  5.2  RBC 4.04  --  3.78*  HGB 11.9* 12.2 11.1*  HCT 37.7 36.0 35.9*  MCV 93.3  --  95.0  MCH 29.5  --  29.4  MCHC 31.6  --  30.9  RDW 13.0  --  13.2  PLT 193  --  165   BNPNo results for input(s): BNP, PROBNP in the last 168 hours.  DDimer No results for input(s): DDIMER in the  last 168 hours.   Radiology/Studies:  CT Head Wo Contrast  Result Date: 02/27/2021 CLINICAL DATA:  87 year old female with syncope. Cavernous left ICA aneurysm. EXAM: CT HEAD WITHOUT CONTRAST TECHNIQUE: Contiguous axial images were obtained from the base of the skull through the vertex without intravenous contrast. COMPARISON:  Brain MRI and CTA head and neck yesterday, and earlier. FINDINGS: Brain: No midline shift, ventriculomegaly, mass effect, evidence of mass lesion, intracranial hemorrhage or evidence of cortically based acute infarction. Gray-white matter differentiation is stable and remains largely normal for age. Vascular: Calcified atherosclerosis at the skull base. Asymmetric cavernous sinus density and mass effect  secondary to the known 1.8 cm large ICA aneurysm demonstrated by CTA yesterday. No suspicious intracranial vascular hyperdensity. Skull: No acute osseous abnormality identified. Sinuses/Orbits: Stable mild right mastoid effusion. Visualized paranasal sinuses are stable and well aerated. Other: No acute orbit or scalp soft tissue finding. IMPRESSION: 1. No acute intracranial abnormality. Large Left ICA aneurysm as demonstrated by CTA yesterday. 2. Chronic mild right mastoid effusion. Electronically Signed   By: Genevie Ann M.D.   On: 02/27/2021 10:48   MR BRAIN WO CONTRAST  Result Date: 02/26/2021 CLINICAL DATA:  Neuro deficit, acute stroke suspected EXAM: MRI HEAD WITHOUT CONTRAST TECHNIQUE: Multiplanar, multiecho pulse sequences of the brain and surrounding structures were obtained without intravenous contrast. COMPARISON:  Same day CT/CTA head FINDINGS: Brain: There is no evidence of acute intracranial hemorrhage, extra-axial fluid collection, or infarct. A few punctate foci of chronic microhemorrhage are not significantly changed, nonspecific. Scattered foci FLAIR signal abnormality in the subcortical and periventricular white matter are nonspecific but likely reflect sequelae of chronic white matter microangiopathy, unchanged. Global parenchymal volume loss with commensurate enlargement of the ventricular system is unchanged. There is no midline shift. No mass lesion is identified. Vascular: A large left cavernous ICA aneurysm is seen, better evaluated on the same-day CT head. The flow voids are otherwise unremarkable. Skull and upper cervical spine: Normal marrow signal. There is degenerative change at the craniocervical junction resulting in moderate to severe stenosis with mass effect on the cervicomedullary junction. Sinuses/Orbits: The paranasal sinuses are clear. There is a large right mastoid effusion, present since at least 12/25/2020. The patient is status post bilateral lens implant. The globes and  orbits are otherwise unremarkable. Other: None. IMPRESSION: 1. No acute intracranial pathology. 2. Unchanged global parenchymal volume loss and chronic white matter microangiopathy. 3. Right mastoid effusion, present since at least 12/25/2020. 4. The left ICA aneurysm is better evaluated on the same day CTA head. Electronically Signed   By: Valetta Mole M.D.   On: 02/26/2021 17:26   MR CERVICAL SPINE WO CONTRAST  Result Date: 02/26/2021 CLINICAL DATA:  Myelopathy, acute or progressive EXAM: MRI CERVICAL SPINE WITHOUT CONTRAST TECHNIQUE: Multiplanar, multisequence MR imaging of the cervical spine was performed. No intravenous contrast was administered. COMPARISON:  Same-day CT a neck, brain MRI and CT a neck 11/21/2015 FINDINGS: Alignment: There is straightening of the normal cervical spine lordosis. There is trace anterolisthesis C3 on C4 and C4 on C5, likely degenerative in nature. There is no jumped or perched facet. Vertebrae: Vertebral body heights are preserved. Marrow signal is within normal limits. Cord: Cord signal is normal; however, note that the cervicomedullary junction is incompletely imaged in the axial plane. Posterior Fossa, vertebral arteries, paraspinal tissues: The brain and posterior fossa are better assessed on the separately dictated brain MRI. The vertebral artery flow voids are present, better assessed on the same day CT  neck. The paraspinal soft tissues are unremarkable. There are multiple thyroid nodules measuring up to 7 mm on the right. Not clinically significant; no follow-up imaging recommended (ref: J Am Coll Radiol. 2015 Feb;12(2): 143-50). Disc levels: There is marked multilevel disc desiccation and narrowing, most advanced at C5-C6 and C6-C7. There is mild diffuse congenital narrowing of the cervical spine with superimposed degenerative changes below. Craniocervical junction: There is degenerative pannus about the dens. There is anterior subluxation of the C1 ring relative to C2  with the posterior arch anteriorly subluxed towards the canal. This results in moderate to severe stenosis of the canal at the level of the dens with mass effect on the cervicomedullary junction. This appears slightly worsened compared to the brain MRI of 11/21/2015. No definite cord signal abnormality is identified. C2-C3: There is a broad-based posterior disc osteophyte complex, ligamentum flavum thickening, and uncovertebral and facet arthropathy resulting in mild spinal canal stenosis and mild bilateral neural foraminal stenosis. C3-C4: There is a prominent posterior disc osteophyte complex, ligamentum flavum thickening, and uncovertebral and facet arthropathy resulting in mild spinal canal stenosis with partial effacement of the ventral thecal sac and severe right and moderate left neural foraminal stenosis. C4-C5: There is a posterior disc osteophyte complex, ligamentum flavum thickening, and uncovertebral and facet arthropathy resulting in mild spinal canal stenosis with partial effacement of the ventral thecal sac and severe bilateral neural foraminal stenosis. C5-C6: There is a posterior disc osteophyte complex with a prominent right paracentral/foraminal protrusion, ligamentum flavum thickening, and uncovertebral and facet arthropathy resulting in mild-to-moderate spinal canal stenosis with mild mass effect on the cord (worse in the right aspect of the canal) and severe bilateral neural foraminal stenosis. C6-C7: There is a posterior disc osteophyte complex, ligamentum flavum thickening, and uncovertebral and facet arthropathy resulting in mild spinal canal stenosis and severe bilateral neural foraminal stenosis C7-T1: No high-grade spinal canal or neural foraminal stenosis. IMPRESSION: 1. Anterior translation of the C1 ring relative to C2 with the posterior ring anteriorly subluxed into the canal. This results in moderate to severe canal stenosis at the level of the dens with mass effect on the cord. No  definite cord signal abnormality identified. The stenosis appears slightly worsened compared to the brain MRI and CT a neck from 11/21/2015. 2. Advanced multilevel degenerative changes throughout the remainder of the cervical spine detailed above resulting in mild-to-moderate spinal canal stenosis at C5-C6 and severe neural foraminal stenosis at numerous levels. Electronically Signed   By: Valetta Mole M.D.   On: 02/26/2021 17:40   DG Chest Port 1 View  Result Date: 02/27/2021 CLINICAL DATA:  87 year old female with shortness of breath. EXAM: PORTABLE CHEST 1 VIEW COMPARISON:  Chest radiographs 11/29/2020 and earlier. FINDINGS: Portable AP semi upright view at 1010 hours. Chronic blunting of the left costophrenic angle appears related to persistent pleural effusion seen by CTA in March. Stable cardiac size and mediastinal contours. Calcified aortic atherosclerosis. Visualized tracheal air column is within normal limits. No pneumothorax, pulmonary edema or consolidation. Upper lobe predominant emphysema and mild lung base scarring are again evident. No acute osseous abnormality identified. IMPRESSION: 1. Chronic small left pleural effusion. No acute cardiopulmonary abnormality. 2. Aortic Atherosclerosis (ICD10-I70.0) and Emphysema (ICD10-J43.9). Electronically Signed   By: Genevie Ann M.D.   On: 02/27/2021 10:43   EEG adult  Result Date: 02/27/2021 Lora Havens, MD     02/27/2021  4:33 PM Patient Name: [name] MRN: [id number] Epilepsy Attending: Lora Havens Referring Physician/Provider:  Dr Orma Flaming Date: 02/27/2021 Duration: 23.37 mins Patient history: 87 year old female with an episode of syncope.  EEG to evaluate for seizures. Level of alertness: Awake AEDs during EEG study: None Technical aspects: This EEG study was done with scalp electrodes positioned according to the 10-20 International system of electrode placement. Electrical activity was acquired at a sampling rate of '500Hz'$  and reviewed  with a high frequency filter of '70Hz'$  and a low frequency filter of '1Hz'$ . EEG data were recorded continuously and digitally stored. Description: The posterior dominant rhythm consists of 8.'5Hz'$  activity of moderate voltage (25-35 uV) seen predominantly in posterior head regions, symmetric and reactive to eye opening and eye closing. Hyperventilation and photic stimulation were not performed.   IMPRESSION: This study is within normal limits. No seizures or epileptiform discharges were seen throughout the recording. Lora Havens   CT HEAD CODE STROKE WO CONTRAST  Result Date: 02/26/2021 CLINICAL DATA:  Code stroke.  Abnormal sensation right hand EXAM: CT HEAD WITHOUT CONTRAST TECHNIQUE: Contiguous axial images were obtained from the base of the skull through the vertex without intravenous contrast. COMPARISON:  12/23/2020 FINDINGS: Brain: No acute intracranial hemorrhage, mass effect, or edema. No new loss of gray-white differentiation. Ventricles are stable in size. No extra-axial collection. Vascular: There is intracranial atherosclerotic calcification at the skull base. No hyperdense vessel. Soft tissue along left carotid reflecting known aneurysm. Skull: No new finding. Sinuses/Orbits: No acute abnormality. Other: Persistent patchy right mastoid opacification. ASPECTS ([place] Stroke Program Early CT Score) - Ganglionic level infarction (caudate, lentiform nuclei, internal capsule, insula, M1-M3 cortex): 7 - Supraganglionic infarction (M4-M6 cortex): 3 Total score (0-10 with 10 being normal): 10 IMPRESSION: There is no acute intracranial hemorrhage or evidence of acute infarction. ASPECT score is 10. These results were communicated to Dr. Curly Shores at 1:54 pm on 02/26/2021 by text page via the [facility] messaging system. Electronically Signed   By: Macy Mis M.D.   On: 02/26/2021 13:56   CT ANGIO HEAD NECK W WO CM (CODE STROKE)  Result Date: 02/26/2021 CLINICAL DATA:  Code stroke.  Abnormal sensation in the  right hand. EXAM: CT ANGIOGRAPHY HEAD AND NECK TECHNIQUE: Multidetector CT imaging of the head and neck was performed using the standard protocol during bolus administration of intravenous contrast. Multiplanar CT image reconstructions and MIPs were obtained to evaluate the vascular anatomy. Carotid stenosis measurements (when applicable) are obtained utilizing NASCET criteria, using the distal internal carotid diameter as the denominator. CONTRAST:  39m OMNIPAQUE IOHEXOL 350 MG/ML SOLN COMPARISON:  CT head without contrast 02/26/2021. MR head without contrast 12/25/2020. CTA of the head and neck 12/23/2020. FINDINGS: CTA NECK FINDINGS Aortic arch: Marked atherosclerotic disease is present in the aorta. Significant irregular noncalcified plaque is present in the distal arch and proximal descending thoracic aorta without significant change. Atherosclerotic calcifications are present at the origins the great vessels without significant stenosis. No aneurysm is present. Right carotid system: Mural calcifications are again noted within the distal right common carotid artery without significant stenosis. Additional calcifications are present in the proximal right ICA without significant stenosis or change. Left carotid system: Atherosclerotic changes are noted in the walls of the distal left common carotid artery. Dilated carotid bulb consistent with previous endarterectomy noted. No residual recurrent stenosis is present. Cervical left ICA is otherwise normal, larger than on the right. Vertebral arteries: The right vertebral artery is the dominant vessel. Atherosclerotic changes are present at the origins of both vertebral arteries without significant stenosis. No significant stenosis  is present in either vertebral artery in the neck. Skeleton: Stable multilevel degenerative changes are present. No acute osseous abnormality is present. Other neck: Soft tissues the neck are otherwise unremarkable. Salivary glands are  within normal limits. Thyroid is normal. No significant adenopathy is present. No focal mucosal or submucosal lesions are present. Upper chest: Mild centrilobular emphysematous changes are present. A left pleural effusion has increased since the prior study. Scattered ground-glass attenuation is present. No focal nodule or mass lesion is present. Review of the MIP images confirms the above findings CTA HEAD FINDINGS Anterior circulation: The left cavernous ICA aneurysm is stable, measuring 1.7 x 1.4 cm on axial images. There is a wide necked of this aneurysm. Left ICA continues to be of larger caliber than on the right. No significant intracranial stenosis or large vessel occlusion is present. The MCA bifurcations are intact. ACA and MCA branch vessels are unremarkable. Posterior circulation: The right vertebral artery is dominant. Minimal calcifications are present in the V4 segment without significant stenosis. The hypoplastic left vertebral artery bifurcates at the PICA. Basilar artery is normal. Both posterior cerebral arteries originate from basilar tip. Prominent right posterior communicating artery contributes. The PCA branch vessels are within normal limits bilaterally. Venous sinuses: The dural sinuses are patent. The straight sinus deep cerebral veins are intact. Cortical veins are within normal limits. No significant vascular malformation is evident. Anatomic variants: None Review of the MIP images confirms the above findings IMPRESSION: 1. No emergent large vessel occlusion. 2. Stable 1.7 x 1.4 cm left cavernous ICA aneurysm. 3. No significant proximal stenosis, new aneurysm, or branch vessel occlusion within the Circle of Willis. 4. Prior left carotid endarterectomy without residual or recurrent stenosis. 5. Extensive atherosclerotic changes in the carotid arteries bilaterally without significant stenosis in the neck. 6. Aortic Atherosclerosis (ICD10-I70.0) and Emphysema (ICD10-J43.9). Electronically  Signed   By: San Morelle M.D.   On: 02/26/2021 14:14     Assessment and Plan:   Syncope Moderate aortic stenosis Extensive vascular disease History of stroke COPD appears stable Chronic kidney disease stage IIIb Chronic combined diastolic and systolic heart failure, clinically euvolemic  Reviewed recent records, echo findings, laboratory data, and outpatient monitor results.  There are a multitude of potential etiologies of syncope in this elderly woman with multiple problems.  Cardiac etiologies could include arrhythmia, postprandial hypotension, neuro depressor syncope as most likely etiologies.  I talked to her about the idea of an implantable loop recorder.  She is not inclined to proceed with this as she does not think she would want to act on any of the information.  I think in the context of her advanced age, conservative therapy is appropriate.  Will monitor the patient on telemetry overnight.  Will follow her vital signs and review her antihypertensive medications.  It may be best to be a bit liberal with her hypertension but this has to be balanced with the fact that she has had a past history of stroke and recent symptoms concerning for TIA.  We will follow-up with the patient tomorrow.  I would anticipate no further cardiac testing while she is in the hospital.   Risk Assessment/Risk Scores:       For questions or updates, please contact [place] Please consult [email or web address] for contact info under    Signed, Sherren Mocha, MD  02/27/2021 6:32 PM

## 2021-02-27 NOTE — ED Provider Notes (Signed)
[facility] EMERGENCY DEPARTMENT Provider Note   CSN: [id number] Arrival date & time: 02/27/21  [id number]     History Chief Complaint  Patient presents with   Loss of Consciousness    [name] is a 87 y.o. female.  Patient is a 87 year old female who presents after a syncopal episode.  She has a history of COPD, chronic kidney disease, atherosclerosis, hypertension, hyperlipidemia, prior embolic stroke in June of this year.  She is actually seen yesterday as a code stroke with some right arm weakness.  She had an MRI that showed no acute strokes.  She also had an MRI of her cervical spine which did show some anterior subluxation of C1 on C2.  She was evaluated by neurology and cleared for discharge.  She was going to follow-up with neurosurgery as an outpatient.  Today she says that she woke up feeling a little short of breath.  She says this happens to her from time to time and normally she will take a Xanax and feel better.  She went downstairs and took a Xanax.  She ate some breakfast and then the next thing she knew she was passed out.  Her daughter was at her side and says that she has been complaining of some shortness of breath and she has had some recent panic attacks which seem similar.  She started fanning herself with the newspaper.  She suddenly dropped the paper and fell forward onto the table.  She did not fall off the chair.  She said her eyes rolled back in her head.  There is no seizure or shaking activity visualized.  She did not think she was breathing and could not feel a pulse.  If she was trying to get her onto the floor, she woke up and started breathing on her own again.  She had told EMS right when she woke up that she had some chest pain but she denies that currently.  She still has a little bit of feeling of weakness in her right arm but she says is better than yesterday.  He denies any numbness or weakness in her other extremities.  No difficulty with her  vision.  She says her speech felt a little off after this event but says it feels pretty much back to normal now.      Past Medical History:  Diagnosis Date   Anxiety    Aortic arch atherosclerosis ([place]) 06/24/2014   Atherosclerotic ulcer of aorta ([place]) 06/24/2014   Carotid artery occlusion    Chronic kidney disease    COPD (chronic obstructive pulmonary disease) ([place])    [place] DISEASE, LUMBAR 12/16/2008   DIVERTICULOSIS, COLON 09/30/2008   DYSPNEA 07/13/2008   [place]ves disease    History of embolic stroke [id number]   Left brain   HYPERLIPIDEMIA 03/06/2007   HYPERTENSION 03/06/2007   HYPOTHYROIDISM 10/13/2007   OSTEOARTHRITIS 03/06/2007   Personal history of colonic polyps 09/30/2008   Stroke ([place])    06/2014            TOBACCO USE, QUIT 04/12/2009   WEAKNESS 11/09/2007    Patient Active Problem List   Diagnosis Date Noted   Cerebral thrombosis with cerebral infarction 12/25/2020   Right hand weakness [id number]   Acute systolic CHF (congestive heart failure) ([place]) 09/01/2020   Acute respiratory failure (HCC)    Mild aortic stenosis 08/30/2020   Elevated troponin 08/30/2020   Pleural effusion on left 08/30/2020   Pain in joint of left  knee 04/10/2020   D-dimer, elevated 01/14/2020   Elevated brain natriuretic peptide (BNP) level 01/14/2020   Current chronic use of systemic steroids 07/13/2019   Medication management 04/02/2018   COPD exacerbation ([place]) 03/31/2018   Hypokalemia 03/30/2018   COPD with acute exacerbation ([place]) 03/02/2018   SOB (shortness of breath) 01/12/2018   CKD (chronic kidney disease), stage III ([place]) 01/12/2018   New onset left bundle branch block (LBBB) 01/12/2018   COPD GOLD I D 01/12/2018   Neurogenic claudication due to lumbar spinal stenosis 09/13/2017   Symptomatic anemia    AVM (arteriovenous malformation) of duodenum, acquired with hemorrhage    Acute GI bleeding 10/09/2016   COPD with emphysema ([place]) 05/10/2016   Recurrent laryngeal neuropathy  02/01/2016   Nausea with vomiting 12/11/2015   Left carotid stenosis 11/21/2015   Asymptomatic carotid artery stenosis 11/13/2015   Impaired glucose tolerance 10/27/2015   Solitary pulmonary nodule 10/27/2015   Thyroid nodule 10/27/2015   Syncope 10/04/2015   Bradycardia with less than 60 beats per minute 10/04/2015   History of embolic stroke [id number]   Paresthesia of both feet 07/06/2014   Aortic arch atherosclerosis ([place]) 06/24/2014   Atherosclerotic ulcer of aorta ([place]) 06/24/2014   Stroke ([place]) 06/22/2014   Bilateral carotid artery disease ([place]) 05/18/2014   TOBACCO USE, QUIT 04/12/2009   [place] DISEASE, LUMBAR 12/16/2008   DIVERTICULOSIS, COLON 09/30/2008   Personal history of colonic polyps 09/30/2008   DYSPNEA 07/13/2008   Weakness 11/09/2007   Hypothyroidism 10/13/2007   Mixed hyperlipidemia 03/06/2007   Essential hypertension 03/06/2007   Osteoarthritis 03/06/2007    Past Surgical History:  Procedure Laterality Date   CARDIAC CATHETERIZATION     CATARACT EXTRACTION Bilateral    CHOLECYSTECTOMY     ENDARTERECTOMY Left 11/13/2015   Procedure: LEFT CAROTID ARTERY ENDARTERECTOMY;  Surgeon: Conrad Gateway, MD;  Location: [facility] OR;  Service: Vascular;  Laterality: Left;   ESOPHAGOGASTRODUODENOSCOPY (EGD) WITH PROPOFOL N/A 10/11/2016   Procedure: ESOPHAGOGASTRODUODENOSCOPY (EGD) WITH PROPOFOL;  Surgeon: Mauri Pole, MD;  Location: [facility] ENDOSCOPY;  Service: Endoscopy;  Laterality: N/A;   KNEE SURGERY     PATCH ANGIOPLASTY Left 11/13/2015   Procedure: WITH 1CM X 6CM  XENOSURE BIOLOGIC PATCH ANGIOPLASTY;  Surgeon: Conrad , MD;  Location: [facility];  Service: Vascular;  Laterality: Left;   TEE WITHOUT CARDIOVERSION N/A 06/24/2014   Procedure: TRANSESOPHAGEAL ECHOCARDIOGRAM (TEE);  Surgeon: Sanda Klein, MD;  Location: [facility] ENDOSCOPY;  Service: Cardiovascular;  Laterality: N/A;     OB History   No obstetric history on file.     Family History  Problem Relation Age of Onset    Stomach cancer Maternal Grandmother    Colon cancer Neg Hx     Social History   Tobacco Use   Smoking status: Former    Packs/day: 1.50    Years: 29.00    Pack years: 43.50    Types: Cigarettes    Start date: 07/08/1949    Quit date: 07/08/1978    Years since quitting: 42.6   Smokeless tobacco: Never  Vaping Use   Vaping Use: Never used  Substance Use Topics   Alcohol use: Yes    Alcohol/week: 1.0 standard drink    Types: 1 Glasses of wine per week    Comment: "red wine"- some nights   Drug use: No    Home Medications Prior to Admission medications   Medication Sig Start Date End Date Taking? Authorizing Provider  albuterol [facility] HFA) 108 (90 Base)  MCG/ACT inhaler Inhale 2 puffs into the lungs every 6 (six) hours as needed for wheezing or shortness of breath. 11/13/17   Volanda Napoleon, PA-C  ALPRAZolam Duanne Moron) 0.5 MG tablet Take 1 tablet (0.5 mg total) by mouth 2 (two) times daily. 11/30/20   Nafziger, Tommi Rumps, NP  amLODipine (NORVASC) 5 MG tablet Take 1 tablet (5 mg total) by mouth daily. 12/07/20   Kathyrn Drown D, NP  aspirin EC 81 MG EC tablet Take 1 tablet (81 mg total) by mouth daily. Swallow whole. 12/28/20   Enzo Bi, MD  atorvastatin (LIPITOR) 80 MG tablet TAKE 1 TABLET BY MOUTH EVERY DAY AT 6PM Patient taking differently: Take 80 mg by mouth at bedtime. TAKE 1 TABLET BY MOUTH EVERY DAY AT [facility] 12/07/20   Kathyrn Drown D, NP  ciprofloxacin (CIPRO) 500 MG tablet Take 1 tablet (500 mg total) by mouth 2 (two) times daily. Patient not taking: Reported on 02/26/2021 01/29/21   Laurey Morale, MD  Cyanocobalamin (B-12 PO) Take 1 tablet by mouth daily.    [provider]  Fluticasone-Umeclidin-Vilant (TRELEGY ELLIPTA) 100-62.5-25 MCG/INH AEPB INHALE 1 PUFF BY MOUTH EVERY DAY Patient taking differently: Inhale 1 puff into the lungs daily. 01/10/21   Mannam, Hart Robinsons, MD  furosemide (LASIX) 20 MG tablet Take 1 tablet (20 mg total) by mouth daily. 12/07/20   Kathyrn Drown D, NP   hydrALAZINE (APRESOLINE) 50 MG tablet Take 1 tablet (50 mg total) by mouth 3 (three) times daily. 12/07/20   Kathyrn Drown D, NP  ipratropium-albuterol (DUONEB) 0.5-2.5 (3) MG/3ML SOLN Take 3 mLs by nebulization every 6 (six) hours as needed (copd). 01/16/20   Danford, Suann Larry, MD  isosorbide mononitrate (IMDUR) 30 MG 24 hr tablet Take 1 tablet (30 mg total) by mouth daily. 12/07/20   Tommie Raymond, NP  latanoprost (XALATAN) 0.005 % ophthalmic solution Place 1 drop into both eyes at bedtime.  04/02/19   [provider]  levothyroxine (SYNTHROID) 75 MCG tablet Take 1 tablet (75 mcg total) by mouth daily before breakfast. 07/04/20   Nafziger, Tommi Rumps, NP  pantoprazole (PROTONIX) 40 MG tablet TAKE 1 TABLET BY MOUTH EVERY DAY Patient taking differently: Take 40 mg by mouth daily. 02/26/21   Nafziger, Tommi Rumps, NP  potassium chloride SA (KLOR-CON) 20 MEQ tablet Take 1 tablet (20 mEq total) by mouth daily. 12/07/20   Tommie Raymond, NP  predniSONE (DELTASONE) 10 MG tablet Take 1 tablet (10 mg total) by mouth daily with breakfast. 01/16/21   Mannam, Hart Robinsons, MD    Allergies    Catapres [clonidine hcl], Clonidine, Lisinopril, Labetalol, and Labetalol hcl  Review of Systems   Review of Systems  Constitutional:  Negative for chills, diaphoresis, fatigue and fever.  HENT:  Negative for congestion, rhinorrhea and sneezing.   Eyes: Negative.   Respiratory:  Positive for shortness of breath. Negative for cough and chest tightness.   Cardiovascular:  Positive for chest pain. Negative for leg swelling.  Gastrointestinal:  Negative for abdominal pain, blood in stool, diarrhea, nausea and vomiting.  Genitourinary:  Negative for difficulty urinating, flank pain, frequency and hematuria.  Musculoskeletal:  Negative for arthralgias and back pain.  Skin:  Negative for rash.  Neurological:  Positive for syncope, speech difficulty and weakness. Negative for dizziness, numbness and headaches.   Physical  Exam Updated Vital Signs BP (!) 150/62   Pulse (!) 56   Temp (!) 97.3 F (36.3 C) (Oral)   Resp 18   SpO2 95%  Physical Exam Constitutional:      Appearance: She is well-developed.  HENT:     Head: Normocephalic and atraumatic.  Eyes:     Pupils: Pupils are equal, round, and reactive to light.  Cardiovascular:     Rate and Rhythm: Normal rate and regular rhythm.     Heart sounds: Normal heart sounds.  Pulmonary:     Effort: Pulmonary effort is normal. No respiratory distress.     Breath sounds: Normal breath sounds. No wheezing or rales.  Chest:     Chest wall: No tenderness.  Abdominal:     General: Bowel sounds are normal.     Palpations: Abdomen is soft.     Tenderness: There is no abdominal tenderness. There is no guarding or rebound.  Musculoskeletal:        General: Normal range of motion.     Cervical back: Normal range of motion and neck supple.  Lymphadenopathy:     Cervical: No cervical adenopathy.  Skin:    General: Skin is warm and dry.     Findings: No rash.  Neurological:     General: No focal deficit present.     Mental Status: She is alert and oriented to person, place, and time.     Comments: Motor 5/5 all extremities Sensation grossly intact to LT all extremities Finger to Nose intact, no pronator drift CN II-XII grossly intact      ED Results / Procedures / Treatments   Labs (all labs ordered are listed, but only abnormal results are displayed) Labs Reviewed  BASIC METABOLIC PANEL - Abnormal; Notable for the following components:      Result Value   Glucose, Bld 107 (*)    Creatinine, Ser 1.43 (*)    Calcium 8.8 (*)    GFR, Estimated 34 (*)    All other components within normal limits  CBC WITH DIFFERENTIAL/PLATELET - Abnormal; Notable for the following components:   RBC 3.78 (*)    Hemoglobin 11.1 (*)    HCT 35.9 (*)    All other components within normal limits  TROPONIN I (HIGH SENSITIVITY) - Abnormal; Notable for the following  components:   Troponin I (High Sensitivity) 24 (*)    All other components within normal limits  SARS CORONAVIRUS 2 (TAT 6-24 HRS)  TROPONIN I (HIGH SENSITIVITY)    EKG EKG Interpretation  Date/Time:  Tuesday February 27 2021 10:00:11 EDT Ventricular Rate:  62 PR Interval:  170 QRS Duration: 122 QT Interval:  452 QTC Calculation: 459 R Axis:   -24 Text Interpretation: Sinus rhythm Supraventricular bigeminy Left bundle branch block since last tracing no significant change Confirmed by Malvin Johns [id number]) on 02/27/2021 10:07:36 AM  Radiology CT Head Wo Contrast  Result Date: 02/27/2021 CLINICAL DATA:  87 year old female with syncope. Cavernous left ICA aneurysm. EXAM: CT HEAD WITHOUT CONTRAST TECHNIQUE: Contiguous axial images were obtained from the base of the skull through the vertex without intravenous contrast. COMPARISON:  Brain MRI and CTA head and neck yesterday, and earlier. FINDINGS: Brain: No midline shift, ventriculomegaly, mass effect, evidence of mass lesion, intracranial hemorrhage or evidence of cortically based acute infarction. Gray-white matter differentiation is stable and remains largely normal for age. Vascular: Calcified atherosclerosis at the skull base. Asymmetric cavernous sinus density and mass effect secondary to the known 1.8 cm large ICA aneurysm demonstrated by CTA yesterday. No suspicious intracranial vascular hyperdensity. Skull: No acute osseous abnormality identified. Sinuses/Orbits: Stable mild right mastoid effusion. Visualized paranasal sinuses are stable  and well aerated. Other: No acute orbit or scalp soft tissue finding. IMPRESSION: 1. No acute intracranial abnormality. Large Left ICA aneurysm as demonstrated by CTA yesterday. 2. Chronic mild right mastoid effusion. Electronically Signed   By: Genevie Ann M.D.   On: 02/27/2021 10:48   MR BRAIN WO CONTRAST  Result Date: 02/26/2021 CLINICAL DATA:  Neuro deficit, acute stroke suspected EXAM: MRI HEAD WITHOUT  CONTRAST TECHNIQUE: Multiplanar, multiecho pulse sequences of the brain and surrounding structures were obtained without intravenous contrast. COMPARISON:  Same day CT/CTA head FINDINGS: Brain: There is no evidence of acute intracranial hemorrhage, extra-axial fluid collection, or infarct. A few punctate foci of chronic microhemorrhage are not significantly changed, nonspecific. Scattered foci FLAIR signal abnormality in the subcortical and periventricular white matter are nonspecific but likely reflect sequelae of chronic white matter microangiopathy, unchanged. Global parenchymal volume loss with commensurate enlargement of the ventricular system is unchanged. There is no midline shift. No mass lesion is identified. Vascular: A large left cavernous ICA aneurysm is seen, better evaluated on the same-day CT head. The flow voids are otherwise unremarkable. Skull and upper cervical spine: Normal marrow signal. There is degenerative change at the craniocervical junction resulting in moderate to severe stenosis with mass effect on the cervicomedullary junction. Sinuses/Orbits: The paranasal sinuses are clear. There is a large right mastoid effusion, present since at least 12/25/2020. The patient is status post bilateral lens implant. The globes and orbits are otherwise unremarkable. Other: None. IMPRESSION: 1. No acute intracranial pathology. 2. Unchanged global parenchymal volume loss and chronic white matter microangiopathy. 3. Right mastoid effusion, present since at least 12/25/2020. 4. The left ICA aneurysm is better evaluated on the same day CTA head. Electronically Signed   By: Valetta Mole M.D.   On: 02/26/2021 17:26   MR CERVICAL SPINE WO CONTRAST  Result Date: 02/26/2021 CLINICAL DATA:  Myelopathy, acute or progressive EXAM: MRI CERVICAL SPINE WITHOUT CONTRAST TECHNIQUE: Multiplanar, multisequence MR imaging of the cervical spine was performed. No intravenous contrast was administered. COMPARISON:   Same-day CT a neck, brain MRI and CT a neck 11/21/2015 FINDINGS: Alignment: There is straightening of the normal cervical spine lordosis. There is trace anterolisthesis C3 on C4 and C4 on C5, likely degenerative in nature. There is no jumped or perched facet. Vertebrae: Vertebral body heights are preserved. Marrow signal is within normal limits. Cord: Cord signal is normal; however, note that the cervicomedullary junction is incompletely imaged in the axial plane. Posterior Fossa, vertebral arteries, paraspinal tissues: The brain and posterior fossa are better assessed on the separately dictated brain MRI. The vertebral artery flow voids are present, better assessed on the same day CT neck. The paraspinal soft tissues are unremarkable. There are multiple thyroid nodules measuring up to 7 mm on the right. Not clinically significant; no follow-up imaging recommended (ref: J Am Coll Radiol. 2015 Feb;12(2): 143-50). Disc levels: There is marked multilevel disc desiccation and narrowing, most advanced at C5-C6 and C6-C7. There is mild diffuse congenital narrowing of the cervical spine with superimposed degenerative changes below. Craniocervical junction: There is degenerative pannus about the dens. There is anterior subluxation of the C1 ring relative to C2 with the posterior arch anteriorly subluxed towards the canal. This results in moderate to severe stenosis of the canal at the level of the dens with mass effect on the cervicomedullary junction. This appears slightly worsened compared to the brain MRI of 11/21/2015. No definite cord signal abnormality is identified. C2-C3: There is a broad-based posterior  disc osteophyte complex, ligamentum flavum thickening, and uncovertebral and facet arthropathy resulting in mild spinal canal stenosis and mild bilateral neural foraminal stenosis. C3-C4: There is a prominent posterior disc osteophyte complex, ligamentum flavum thickening, and uncovertebral and facet arthropathy  resulting in mild spinal canal stenosis with partial effacement of the ventral thecal sac and severe right and moderate left neural foraminal stenosis. C4-C5: There is a posterior disc osteophyte complex, ligamentum flavum thickening, and uncovertebral and facet arthropathy resulting in mild spinal canal stenosis with partial effacement of the ventral thecal sac and severe bilateral neural foraminal stenosis. C5-C6: There is a posterior disc osteophyte complex with a prominent right paracentral/foraminal protrusion, ligamentum flavum thickening, and uncovertebral and facet arthropathy resulting in mild-to-moderate spinal canal stenosis with mild mass effect on the cord (worse in the right aspect of the canal) and severe bilateral neural foraminal stenosis. C6-C7: There is a posterior disc osteophyte complex, ligamentum flavum thickening, and uncovertebral and facet arthropathy resulting in mild spinal canal stenosis and severe bilateral neural foraminal stenosis C7-T1: No high-grade spinal canal or neural foraminal stenosis. IMPRESSION: 1. Anterior translation of the C1 ring relative to C2 with the posterior ring anteriorly subluxed into the canal. This results in moderate to severe canal stenosis at the level of the dens with mass effect on the cord. No definite cord signal abnormality identified. The stenosis appears slightly worsened compared to the brain MRI and CT a neck from 11/21/2015. 2. Advanced multilevel degenerative changes throughout the remainder of the cervical spine detailed above resulting in mild-to-moderate spinal canal stenosis at C5-C6 and severe neural foraminal stenosis at numerous levels. Electronically Signed   By: Valetta Mole M.D.   On: 02/26/2021 17:40   DG Chest Port 1 View  Result Date: 02/27/2021 CLINICAL DATA:  87 year old female with shortness of breath. EXAM: PORTABLE CHEST 1 VIEW COMPARISON:  Chest radiographs 11/29/2020 and earlier. FINDINGS: Portable AP semi upright view at  1010 hours. Chronic blunting of the left costophrenic angle appears related to persistent pleural effusion seen by CTA in March. Stable cardiac size and mediastinal contours. Calcified aortic atherosclerosis. Visualized tracheal air column is within normal limits. No pneumothorax, pulmonary edema or consolidation. Upper lobe predominant emphysema and mild lung base scarring are again evident. No acute osseous abnormality identified. IMPRESSION: 1. Chronic small left pleural effusion. No acute cardiopulmonary abnormality. 2. Aortic Atherosclerosis (ICD10-I70.0) and Emphysema (ICD10-J43.9). Electronically Signed   By: Genevie Ann M.D.   On: 02/27/2021 10:43   CT HEAD CODE STROKE WO CONTRAST  Result Date: 02/26/2021 CLINICAL DATA:  Code stroke.  Abnormal sensation right hand EXAM: CT HEAD WITHOUT CONTRAST TECHNIQUE: Contiguous axial images were obtained from the base of the skull through the vertex without intravenous contrast. COMPARISON:  12/23/2020 FINDINGS: Brain: No acute intracranial hemorrhage, mass effect, or edema. No new loss of gray-white differentiation. Ventricles are stable in size. No extra-axial collection. Vascular: There is intracranial atherosclerotic calcification at the skull base. No hyperdense vessel. Soft tissue along left carotid reflecting known aneurysm. Skull: No new finding. Sinuses/Orbits: No acute abnormality. Other: Persistent patchy right mastoid opacification. ASPECTS ([place] Stroke Program Early CT Score) - Ganglionic level infarction (caudate, lentiform nuclei, internal capsule, insula, M1-M3 cortex): 7 - Supraganglionic infarction (M4-M6 cortex): 3 Total score (0-10 with 10 being normal): 10 IMPRESSION: There is no acute intracranial hemorrhage or evidence of acute infarction. ASPECT score is 10. These results were communicated to Dr. Curly Shores at 1:54 pm on 02/26/2021 by text page via the [facility] messaging  system. Electronically Signed   By: Macy Mis M.D.   On: 02/26/2021 13:56    CT ANGIO HEAD NECK W WO CM (CODE STROKE)  Result Date: 02/26/2021 CLINICAL DATA:  Code stroke.  Abnormal sensation in the right hand. EXAM: CT ANGIOGRAPHY HEAD AND NECK TECHNIQUE: Multidetector CT imaging of the head and neck was performed using the standard protocol during bolus administration of intravenous contrast. Multiplanar CT image reconstructions and MIPs were obtained to evaluate the vascular anatomy. Carotid stenosis measurements (when applicable) are obtained utilizing NASCET criteria, using the distal internal carotid diameter as the denominator. CONTRAST:  13m OMNIPAQUE IOHEXOL 350 MG/ML SOLN COMPARISON:  CT head without contrast 02/26/2021. MR head without contrast 12/25/2020. CTA of the head and neck 12/23/2020. FINDINGS: CTA NECK FINDINGS Aortic arch: Marked atherosclerotic disease is present in the aorta. Significant irregular noncalcified plaque is present in the distal arch and proximal descending thoracic aorta without significant change. Atherosclerotic calcifications are present at the origins the great vessels without significant stenosis. No aneurysm is present. Right carotid system: Mural calcifications are again noted within the distal right common carotid artery without significant stenosis. Additional calcifications are present in the proximal right ICA without significant stenosis or change. Left carotid system: Atherosclerotic changes are noted in the walls of the distal left common carotid artery. Dilated carotid bulb consistent with previous endarterectomy noted. No residual recurrent stenosis is present. Cervical left ICA is otherwise normal, larger than on the right. Vertebral arteries: The right vertebral artery is the dominant vessel. Atherosclerotic changes are present at the origins of both vertebral arteries without significant stenosis. No significant stenosis is present in either vertebral artery in the neck. Skeleton: Stable multilevel degenerative changes are  present. No acute osseous abnormality is present. Other neck: Soft tissues the neck are otherwise unremarkable. Salivary glands are within normal limits. Thyroid is normal. No significant adenopathy is present. No focal mucosal or submucosal lesions are present. Upper chest: Mild centrilobular emphysematous changes are present. A left pleural effusion has increased since the prior study. Scattered ground-glass attenuation is present. No focal nodule or mass lesion is present. Review of the MIP images confirms the above findings CTA HEAD FINDINGS Anterior circulation: The left cavernous ICA aneurysm is stable, measuring 1.7 x 1.4 cm on axial images. There is a wide necked of this aneurysm. Left ICA continues to be of larger caliber than on the right. No significant intracranial stenosis or large vessel occlusion is present. The MCA bifurcations are intact. ACA and MCA branch vessels are unremarkable. Posterior circulation: The right vertebral artery is dominant. Minimal calcifications are present in the V4 segment without significant stenosis. The hypoplastic left vertebral artery bifurcates at the PICA. Basilar artery is normal. Both posterior cerebral arteries originate from basilar tip. Prominent right posterior communicating artery contributes. The PCA branch vessels are within normal limits bilaterally. Venous sinuses: The dural sinuses are patent. The straight sinus deep cerebral veins are intact. Cortical veins are within normal limits. No significant vascular malformation is evident. Anatomic variants: None Review of the MIP images confirms the above findings IMPRESSION: 1. No emergent large vessel occlusion. 2. Stable 1.7 x 1.4 cm left cavernous ICA aneurysm. 3. No significant proximal stenosis, new aneurysm, or branch vessel occlusion within the Circle of Willis. 4. Prior left carotid endarterectomy without residual or recurrent stenosis. 5. Extensive atherosclerotic changes in the carotid arteries  bilaterally without significant stenosis in the neck. 6. Aortic Atherosclerosis (ICD10-I70.0) and Emphysema (ICD10-J43.9). Electronically Signed   By:  San Morelle M.D.   On: 02/26/2021 14:14    Procedures Procedures   Medications Ordered in ED Medications - No data to display  ED Course  I have reviewed the triage vital signs and the nursing notes.  Pertinent labs & imaging results that were available during my care of the patient were reviewed by me and considered in my medical decision making (see chart for details).    MDM Rules/Calculators/A&P                           Patient is a 87 year old female who presents after syncopal episode.  She did not have any preceding symptoms other than some shortness of breath that she had on awakening.  No dizziness, palpitations or chest pain.  She denies any current shortness of breath.  Her chest x-ray is clear without evidence of pneumonia or pulmonary edema.  Her EKG does not show any arrhythmias or ischemic changes.  Head CT shows no acute changes from yesterday.  I spoke with Dr. Curly Shores with neurology who felt that patient could have an EEG to rule out seizure but otherwise she does not have any concern for further neurologic work-up.  I spoke with Dr. Kathyrn Sheriff with neurosurgery who reviewed the MRI of her cervical spine which she had yesterday.  He does not feel that at her age there would be any indication for intervention.  Her labs are nonconcerning.  She does not have any focal neurologic deficits other than some mild weakness of her right arm.  I spoke with Dr. Rogers Blocker who will admit the patient for further treatment. Final Clinical Impression(s) / ED Diagnoses Final diagnoses:  Syncope and collapse    Rx / DC Orders ED Discharge Orders     None        Malvin Johns, MD 02/27/21 [phone]

## 2021-02-27 NOTE — Progress Notes (Signed)
EEG complete - results pending 

## 2021-02-27 NOTE — ED Notes (Signed)
Awaiting transport.

## 2021-02-27 NOTE — ED Notes (Signed)
Patient transported to CT 

## 2021-02-27 NOTE — H&P (Signed)
History and Physical    KESLYN VANDEVOORT [id number] DOB: 02-22-1925 DOA: 02/27/2021  PCP: [name], NP Consultants:  pulmonology: [facility]   cardiology: Dr. Oval Linsey Patient coming from:  Home - lives alone.   Chief Complaint: syncope    HPI: [name] is a 87 y.o. female with medical history significant of hx of stroke, CKD stage 3, hypothyroidism, HLD, HTN, COPD on chronic steroids, bilateral carotid artery disease s/p left CEA, left ICA aneurysm who presented for possible syncopal event this morning. She woke up around 7:30am and felt like she had a hard time breathing and precipitates a panic attack and she took a xanax and came downstairs. She was at the breakfast table and doing better. She then got hot and told her daughter she didn't feel good.  She then dropped the paper, her head went back and was over the chair, eyes rolled back and then was staring straight up and then she stopped breathing.  Her daughter then called 911 and was talking to her mom with no response.  911 asked her to lay her down. She was completely unresponsive. When she tried to move her to the floor, she woke up and said, "No." EMS arrived and put her on oxygen and took her vitals. She was a little out of it and had some confusion. She was able to answer questions appropriately. Told EMS her chest hurt, but she doesn't remember any of this. Daughter states she was unresponsive for 4 minutes. Denies any shaking, loss of urine, tongue biting during episode. She denies any headache afterwards, but states she had a hard time talking.   She was seen yesterday in ER for stroke like symptoms with right sided arm weakness and double vision. Neurology was consulted and w/u was negative for acute stroke. She does have a stable 1.7cmx 1.4cm left cavernous ICA aneurysm, but given her advanced age and comorbidities it is been felt that risk of intervention outweigh the benefits.  She was also noted to have quite significant  cervical spine stenosis at C1, however do to her excellent function neurology felt that she could follow-up closely with neurosurgery outpatient for this.  She denies any headaches, vision changes, chest pain, palpitations, N/V/D, abdominal pain, leg swelling, dysuria. Right sided weakness has resolved. She has been eating and drinking well until this AM.   ED Course: vitals: afebrile, bp: 137/55, HR: 58, RR: 16, oxygen: 99% 2L [place], but 96% RA Pertinent labs: Creatinine: 1.43 (1.17-1.39), Hgb: 11.1, Troponin 24--> 22(baseline: 40-60) CXR: chronic small pleural effusion, CTH: large left ICA aneurysm, MRI brain yesterday: no acute pathology, CTA: stable 1.7x 1.4cm left cavernous ICA aneurysm.  MIC cervical spine: Anterior translation of the C1 ring relative to C2 with the posterior ring anteriorly subluxed into the canal. This results in moderate to severe canal stenosis at the level of the dens with mass effect on the cord. Neurosurgery called and would not do anything on her cervical spine. Neurology also consulted and will do EEG.  We were asked to admit.   Review of Systems: As per HPI; otherwise review of systems reviewed and negative.   Ambulatory Status:  Ambulates with walker     Past Medical History:  Diagnosis Date   Anxiety    Aortic arch atherosclerosis ([place]) 06/24/2014   Atherosclerotic ulcer of aorta ([place]) 06/24/2014   Carotid artery occlusion    Chronic kidney disease    COPD (chronic obstructive pulmonary disease) (HCC)    DISC DISEASE,  LUMBAR 12/16/2008   DIVERTICULOSIS, COLON 09/30/2008   DYSPNEA 07/13/2008   Graves disease    History of embolic stroke [id number]   Left brain   HYPERLIPIDEMIA 03/06/2007   HYPERTENSION 03/06/2007   HYPOTHYROIDISM 10/13/2007   OSTEOARTHRITIS 03/06/2007   Personal history of colonic polyps 09/30/2008   Stroke ([place])    06/2014            TOBACCO USE, QUIT 04/12/2009   WEAKNESS 11/09/2007    Past Surgical History:  Procedure Laterality Date    CARDIAC CATHETERIZATION     CATARACT EXTRACTION Bilateral    CHOLECYSTECTOMY     ENDARTERECTOMY Left 11/13/2015   Procedure: LEFT CAROTID ARTERY ENDARTERECTOMY;  Surgeon: Conrad Jane, MD;  Location: [facility] OR;  Service: Vascular;  Laterality: Left;   ESOPHAGOGASTRODUODENOSCOPY (EGD) WITH PROPOFOL N/A 10/11/2016   Procedure: ESOPHAGOGASTRODUODENOSCOPY (EGD) WITH PROPOFOL;  Surgeon: Mauri Pole, MD;  Location: [facility] ENDOSCOPY;  Service: Endoscopy;  Laterality: N/A;   KNEE SURGERY     PATCH ANGIOPLASTY Left 11/13/2015   Procedure: WITH 1CM X 6CM  XENOSURE BIOLOGIC PATCH ANGIOPLASTY;  Surgeon: Conrad , MD;  Location: [place];  Service: Vascular;  Laterality: Left;   TEE WITHOUT CARDIOVERSION N/A 06/24/2014   Procedure: TRANSESOPHAGEAL ECHOCARDIOGRAM (TEE);  Surgeon: Sanda Klein, MD;  Location: [facility] ENDOSCOPY;  Service: Cardiovascular;  Laterality: N/A;    Social History   Socioeconomic History   Marital status: Divorced    Spouse name: Not on file   Number of children: 4   Years of education: college   Highest education level: Not on file  Occupational History   Occupation: retired  Tobacco Use   Smoking status: Former    Packs/day: 1.50    Years: 29.00    Pack years: 43.50    Types: Cigarettes    Start date: 07/08/1949    Quit date: 07/08/1978    Years since quitting: 42.6   Smokeless tobacco: Never  Vaping Use   Vaping Use: Never used  Substance and Sexual Activity   Alcohol use: Yes    Alcohol/week: 1.0 standard drink    Types: 1 Glasses of wine per week    Comment: "red wine"- some nights   Drug use: No   Sexual activity: Not on file  Other Topics Concern   Not on file  Social History Narrative      Patient is right handed.   Patient drinks 1 cup caffeine daily.   Social Determinants of Health   Financial Resource Strain: Not on file  Food Insecurity: Not on file  Transportation Needs: Not on file  Physical Activity: Not on file  Stress: Not on file  Social  Connections: Not on file  Intimate Partner Violence: Not on file    Allergies  Allergen Reactions   Catapres [Clonidine Hcl] Other (See Comments)    Made pt feel horrible, shaky, weak and nausea   Clonidine Other (See Comments)    Made pt feel horrible, shaky, weak and nausea   Lisinopril Anaphylaxis    Tongue swelling   Labetalol Other (See Comments)    Caused bradycardia and syncope   Labetalol Hcl Other (See Comments)    Caused bradycardia and syncope    Family History  Problem Relation Age of Onset   Stomach cancer Maternal Grandmother    Colon cancer Neg Hx     Prior to Admission medications   Medication Sig Start Date End Date Taking? Authorizing Provider  albuterol (PROAIR HFA) 108 (90  Base) MCG/ACT inhaler Inhale 2 puffs into the lungs every 6 (six) hours as needed for wheezing or shortness of breath. 11/13/17   Volanda Napoleon, PA-C  ALPRAZolam Duanne Moron) 0.5 MG tablet Take 1 tablet (0.5 mg total) by mouth 2 (two) times daily. 11/30/20   Nafziger, Tommi Rumps, NP  amLODipine (NORVASC) 5 MG tablet Take 1 tablet (5 mg total) by mouth daily. 12/07/20   Kathyrn Drown D, NP  aspirin EC 81 MG EC tablet Take 1 tablet (81 mg total) by mouth daily. Swallow whole. 12/28/20   Enzo Bi, MD  atorvastatin (LIPITOR) 80 MG tablet TAKE 1 TABLET BY MOUTH EVERY DAY AT 6PM Patient taking differently: Take 80 mg by mouth at bedtime. TAKE 1 TABLET BY MOUTH EVERY DAY AT [facility] 12/07/20   Kathyrn Drown D, NP  ciprofloxacin (CIPRO) 500 MG tablet Take 1 tablet (500 mg total) by mouth 2 (two) times daily. Patient not taking: Reported on 02/26/2021 01/29/21   Laurey Morale, MD  Cyanocobalamin (B-12 PO) Take 1 tablet by mouth daily.    [provider]  Fluticasone-Umeclidin-Vilant (TRELEGY ELLIPTA) 100-62.5-25 MCG/INH AEPB INHALE 1 PUFF BY MOUTH EVERY DAY Patient taking differently: Inhale 1 puff into the lungs daily. 01/10/21   Mannam, Hart Robinsons, MD  furosemide (LASIX) 20 MG tablet Take 1 tablet (20 mg total)  by mouth daily. 12/07/20   Kathyrn Drown D, NP  hydrALAZINE (APRESOLINE) 50 MG tablet Take 1 tablet (50 mg total) by mouth 3 (three) times daily. 12/07/20   Kathyrn Drown D, NP  ipratropium-albuterol (DUONEB) 0.5-2.5 (3) MG/3ML SOLN Take 3 mLs by nebulization every 6 (six) hours as needed (copd). 01/16/20   Danford, Suann Larry, MD  isosorbide mononitrate (IMDUR) 30 MG 24 hr tablet Take 1 tablet (30 mg total) by mouth daily. 12/07/20   Tommie Raymond, NP  latanoprost (XALATAN) 0.005 % ophthalmic solution Place 1 drop into both eyes at bedtime.  04/02/19   [provider]  levothyroxine (SYNTHROID) 75 MCG tablet Take 1 tablet (75 mcg total) by mouth daily before breakfast. 07/04/20   Nafziger, Tommi Rumps, NP  pantoprazole (PROTONIX) 40 MG tablet TAKE 1 TABLET BY MOUTH EVERY DAY Patient taking differently: Take 40 mg by mouth daily. 02/26/21   Nafziger, Tommi Rumps, NP  potassium chloride SA (KLOR-CON) 20 MEQ tablet Take 1 tablet (20 mEq total) by mouth daily. 12/07/20   Tommie Raymond, NP  predniSONE (DELTASONE) 10 MG tablet Take 1 tablet (10 mg total) by mouth daily with breakfast. 01/16/21   Marshell Garfinkel, MD    Physical Exam: Vitals:   02/27/21 1200 02/27/21 1215 02/27/21 1300 02/27/21 1330  BP: 139/70 (!) 133/54 (!) 150/62 (!) 152/64  Pulse: (!) 56 (!) 52 (!) 56 61  Resp: '19 18 18 15  '$ Temp:      TempSrc:      SpO2: 96% 96% 95% 97%     General:  Appears calm and comfortable and is in NAD Eyes:  PERRL, EOMI, normal lids, iris ENT:  grossly normal hearing, lips & tongue, dry mucous membranes; dentures Neck:  no LAD, masses or thyromegaly; no carotid bruits Cardiovascular:  RRR, harsh systolic murmur. Trace, bilateral LE edema.  Respiratory:   CTA bilaterally with no wheezes/rales/rhonchi.  Normal respiratory effort. Abdomen:  soft, NT, ND, NABS Back:   normal alignment, no CVAT Skin:  purpura on bilateral lower legs  Musculoskeletal:  grossly normal tone BUE/BLE, good ROM, no bony  abnormality Lower extremity:    Limited foot exam  with no ulcerations.  2+ distal pulses. Psychiatric:  grossly normal mood and affect, speech fluent and appropriate, AOx3 Neurologic:  CN 2-12 grossly intact, moves all extremities in coordinated fashion, sensation intact    Radiological Exams on Admission: Independently reviewed - see discussion in A/P where applicable  CT Head Wo Contrast  Result Date: 02/27/2021 CLINICAL DATA:  87 year old female with syncope. Cavernous left ICA aneurysm. EXAM: CT HEAD WITHOUT CONTRAST TECHNIQUE: Contiguous axial images were obtained from the base of the skull through the vertex without intravenous contrast. COMPARISON:  Brain MRI and CTA head and neck yesterday, and earlier. FINDINGS: Brain: No midline shift, ventriculomegaly, mass effect, evidence of mass lesion, intracranial hemorrhage or evidence of cortically based acute infarction. Gray-white matter differentiation is stable and remains largely normal for age. Vascular: Calcified atherosclerosis at the skull base. Asymmetric cavernous sinus density and mass effect secondary to the known 1.8 cm large ICA aneurysm demonstrated by CTA yesterday. No suspicious intracranial vascular hyperdensity. Skull: No acute osseous abnormality identified. Sinuses/Orbits: Stable mild right mastoid effusion. Visualized paranasal sinuses are stable and well aerated. Other: No acute orbit or scalp soft tissue finding. IMPRESSION: 1. No acute intracranial abnormality. Large Left ICA aneurysm as demonstrated by CTA yesterday. 2. Chronic mild right mastoid effusion. Electronically Signed   By: Genevie Ann M.D.   On: 02/27/2021 10:48   MR BRAIN WO CONTRAST  Result Date: 02/26/2021 CLINICAL DATA:  Neuro deficit, acute stroke suspected EXAM: MRI HEAD WITHOUT CONTRAST TECHNIQUE: Multiplanar, multiecho pulse sequences of the brain and surrounding structures were obtained without intravenous contrast. COMPARISON:  Same day CT/CTA head  FINDINGS: Brain: There is no evidence of acute intracranial hemorrhage, extra-axial fluid collection, or infarct. A few punctate foci of chronic microhemorrhage are not significantly changed, nonspecific. Scattered foci FLAIR signal abnormality in the subcortical and periventricular white matter are nonspecific but likely reflect sequelae of chronic white matter microangiopathy, unchanged. Global parenchymal volume loss with commensurate enlargement of the ventricular system is unchanged. There is no midline shift. No mass lesion is identified. Vascular: A large left cavernous ICA aneurysm is seen, better evaluated on the same-day CT head. The flow voids are otherwise unremarkable. Skull and upper cervical spine: Normal marrow signal. There is degenerative change at the craniocervical junction resulting in moderate to severe stenosis with mass effect on the cervicomedullary junction. Sinuses/Orbits: The paranasal sinuses are clear. There is a large right mastoid effusion, present since at least 12/25/2020. The patient is status post bilateral lens implant. The globes and orbits are otherwise unremarkable. Other: None. IMPRESSION: 1. No acute intracranial pathology. 2. Unchanged global parenchymal volume loss and chronic white matter microangiopathy. 3. Right mastoid effusion, present since at least 12/25/2020. 4. The left ICA aneurysm is better evaluated on the same day CTA head. Electronically Signed   By: Valetta Mole M.D.   On: 02/26/2021 17:26   MR CERVICAL SPINE WO CONTRAST  Result Date: 02/26/2021 CLINICAL DATA:  Myelopathy, acute or progressive EXAM: MRI CERVICAL SPINE WITHOUT CONTRAST TECHNIQUE: Multiplanar, multisequence MR imaging of the cervical spine was performed. No intravenous contrast was administered. COMPARISON:  Same-day CT a neck, brain MRI and CT a neck 11/21/2015 FINDINGS: Alignment: There is straightening of the normal cervical spine lordosis. There is trace anterolisthesis C3 on C4 and C4  on C5, likely degenerative in nature. There is no jumped or perched facet. Vertebrae: Vertebral body heights are preserved. Marrow signal is within normal limits. Cord: Cord signal is normal; however, note that the  cervicomedullary junction is incompletely imaged in the axial plane. Posterior Fossa, vertebral arteries, paraspinal tissues: The brain and posterior fossa are better assessed on the separately dictated brain MRI. The vertebral artery flow voids are present, better assessed on the same day CT neck. The paraspinal soft tissues are unremarkable. There are multiple thyroid nodules measuring up to 7 mm on the right. Not clinically significant; no follow-up imaging recommended (ref: J Am Coll Radiol. 2015 Feb;12(2): 143-50). Disc levels: There is marked multilevel disc desiccation and narrowing, most advanced at C5-C6 and C6-C7. There is mild diffuse congenital narrowing of the cervical spine with superimposed degenerative changes below. Craniocervical junction: There is degenerative pannus about the dens. There is anterior subluxation of the C1 ring relative to C2 with the posterior arch anteriorly subluxed towards the canal. This results in moderate to severe stenosis of the canal at the level of the dens with mass effect on the cervicomedullary junction. This appears slightly worsened compared to the brain MRI of 11/21/2015. No definite cord signal abnormality is identified. C2-C3: There is a broad-based posterior disc osteophyte complex, ligamentum flavum thickening, and uncovertebral and facet arthropathy resulting in mild spinal canal stenosis and mild bilateral neural foraminal stenosis. C3-C4: There is a prominent posterior disc osteophyte complex, ligamentum flavum thickening, and uncovertebral and facet arthropathy resulting in mild spinal canal stenosis with partial effacement of the ventral thecal sac and severe right and moderate left neural foraminal stenosis. C4-C5: There is a posterior disc  osteophyte complex, ligamentum flavum thickening, and uncovertebral and facet arthropathy resulting in mild spinal canal stenosis with partial effacement of the ventral thecal sac and severe bilateral neural foraminal stenosis. C5-C6: There is a posterior disc osteophyte complex with a prominent right paracentral/foraminal protrusion, ligamentum flavum thickening, and uncovertebral and facet arthropathy resulting in mild-to-moderate spinal canal stenosis with mild mass effect on the cord (worse in the right aspect of the canal) and severe bilateral neural foraminal stenosis. C6-C7: There is a posterior disc osteophyte complex, ligamentum flavum thickening, and uncovertebral and facet arthropathy resulting in mild spinal canal stenosis and severe bilateral neural foraminal stenosis C7-T1: No high-grade spinal canal or neural foraminal stenosis. IMPRESSION: 1. Anterior translation of the C1 ring relative to C2 with the posterior ring anteriorly subluxed into the canal. This results in moderate to severe canal stenosis at the level of the dens with mass effect on the cord. No definite cord signal abnormality identified. The stenosis appears slightly worsened compared to the brain MRI and CT a neck from 11/21/2015. 2. Advanced multilevel degenerative changes throughout the remainder of the cervical spine detailed above resulting in mild-to-moderate spinal canal stenosis at C5-C6 and severe neural foraminal stenosis at numerous levels. Electronically Signed   By: Valetta Mole M.D.   On: 02/26/2021 17:40   DG Chest Port 1 View  Result Date: 02/27/2021 CLINICAL DATA:  87 year old female with shortness of breath. EXAM: PORTABLE CHEST 1 VIEW COMPARISON:  Chest radiographs 11/29/2020 and earlier. FINDINGS: Portable AP semi upright view at 1010 hours. Chronic blunting of the left costophrenic angle appears related to persistent pleural effusion seen by CTA in March. Stable cardiac size and mediastinal contours. Calcified  aortic atherosclerosis. Visualized tracheal air column is within normal limits. No pneumothorax, pulmonary edema or consolidation. Upper lobe predominant emphysema and mild lung base scarring are again evident. No acute osseous abnormality identified. IMPRESSION: 1. Chronic small left pleural effusion. No acute cardiopulmonary abnormality. 2. Aortic Atherosclerosis (ICD10-I70.0) and Emphysema (ICD10-J43.9). Electronically Signed   By:  Genevie Ann M.D.   On: 02/27/2021 10:43   CT HEAD CODE STROKE WO CONTRAST  Result Date: 02/26/2021 CLINICAL DATA:  Code stroke.  Abnormal sensation right hand EXAM: CT HEAD WITHOUT CONTRAST TECHNIQUE: Contiguous axial images were obtained from the base of the skull through the vertex without intravenous contrast. COMPARISON:  12/23/2020 FINDINGS: Brain: No acute intracranial hemorrhage, mass effect, or edema. No new loss of gray-white differentiation. Ventricles are stable in size. No extra-axial collection. Vascular: There is intracranial atherosclerotic calcification at the skull base. No hyperdense vessel. Soft tissue along left carotid reflecting known aneurysm. Skull: No new finding. Sinuses/Orbits: No acute abnormality. Other: Persistent patchy right mastoid opacification. ASPECTS ([place] Stroke Program Early CT Score) - Ganglionic level infarction (caudate, lentiform nuclei, internal capsule, insula, M1-M3 cortex): 7 - Supraganglionic infarction (M4-M6 cortex): 3 Total score (0-10 with 10 being normal): 10 IMPRESSION: There is no acute intracranial hemorrhage or evidence of acute infarction. ASPECT score is 10. These results were communicated to Dr. Curly Shores at 1:54 pm on 02/26/2021 by text page via the [facility] messaging system. Electronically Signed   By: Macy Mis M.D.   On: 02/26/2021 13:56   CT ANGIO HEAD NECK W WO CM (CODE STROKE)  Result Date: 02/26/2021 CLINICAL DATA:  Code stroke.  Abnormal sensation in the right hand. EXAM: CT ANGIOGRAPHY HEAD AND NECK TECHNIQUE:  Multidetector CT imaging of the head and neck was performed using the standard protocol during bolus administration of intravenous contrast. Multiplanar CT image reconstructions and MIPs were obtained to evaluate the vascular anatomy. Carotid stenosis measurements (when applicable) are obtained utilizing NASCET criteria, using the distal internal carotid diameter as the denominator. CONTRAST:  72m OMNIPAQUE IOHEXOL 350 MG/ML SOLN COMPARISON:  CT head without contrast 02/26/2021. MR head without contrast 12/25/2020. CTA of the head and neck 12/23/2020. FINDINGS: CTA NECK FINDINGS Aortic arch: Marked atherosclerotic disease is present in the aorta. Significant irregular noncalcified plaque is present in the distal arch and proximal descending thoracic aorta without significant change. Atherosclerotic calcifications are present at the origins the great vessels without significant stenosis. No aneurysm is present. Right carotid system: Mural calcifications are again noted within the distal right common carotid artery without significant stenosis. Additional calcifications are present in the proximal right ICA without significant stenosis or change. Left carotid system: Atherosclerotic changes are noted in the walls of the distal left common carotid artery. Dilated carotid bulb consistent with previous endarterectomy noted. No residual recurrent stenosis is present. Cervical left ICA is otherwise normal, larger than on the right. Vertebral arteries: The right vertebral artery is the dominant vessel. Atherosclerotic changes are present at the origins of both vertebral arteries without significant stenosis. No significant stenosis is present in either vertebral artery in the neck. Skeleton: Stable multilevel degenerative changes are present. No acute osseous abnormality is present. Other neck: Soft tissues the neck are otherwise unremarkable. Salivary glands are within normal limits. Thyroid is normal. No significant  adenopathy is present. No focal mucosal or submucosal lesions are present. Upper chest: Mild centrilobular emphysematous changes are present. A left pleural effusion has increased since the prior study. Scattered ground-glass attenuation is present. No focal nodule or mass lesion is present. Review of the MIP images confirms the above findings CTA HEAD FINDINGS Anterior circulation: The left cavernous ICA aneurysm is stable, measuring 1.7 x 1.4 cm on axial images. There is a wide necked of this aneurysm. Left ICA continues to be of larger caliber than on the right. No significant  intracranial stenosis or large vessel occlusion is present. The MCA bifurcations are intact. ACA and MCA branch vessels are unremarkable. Posterior circulation: The right vertebral artery is dominant. Minimal calcifications are present in the V4 segment without significant stenosis. The hypoplastic left vertebral artery bifurcates at the PICA. Basilar artery is normal. Both posterior cerebral arteries originate from basilar tip. Prominent right posterior communicating artery contributes. The PCA branch vessels are within normal limits bilaterally. Venous sinuses: The dural sinuses are patent. The straight sinus deep cerebral veins are intact. Cortical veins are within normal limits. No significant vascular malformation is evident. Anatomic variants: None Review of the MIP images confirms the above findings IMPRESSION: 1. No emergent large vessel occlusion. 2. Stable 1.7 x 1.4 cm left cavernous ICA aneurysm. 3. No significant proximal stenosis, new aneurysm, or branch vessel occlusion within the Circle of Willis. 4. Prior left carotid endarterectomy without residual or recurrent stenosis. 5. Extensive atherosclerotic changes in the carotid arteries bilaterally without significant stenosis in the neck. 6. Aortic Atherosclerosis (ICD10-I70.0) and Emphysema (ICD10-J43.9). Electronically Signed   By: San Morelle M.D.   On: 02/26/2021  14:14    EKG: Independently reviewed.  NSR with rate 62, LBBB; nonspecific ST changes with no evidence of acute ischemia. No significant changes.    Labs on Admission: I have personally reviewed the available labs and imaging studies at the time of the admission.  Pertinent labs:  Creatinine: 1.43 (1.17-1.39) Hgb: 11.1 Troponin 24--> 22 (baseline: 40-60)   Assessment/Plan Principal Problem:   Syncope -87 year old with witnessed syncopal episode by daughter this morning.  Admit for syncopal work-up.  Just seen yesterday in ER for right-sided weakness with extensive stroke work-up that was negative. -orthostatics pending  -telemetry  -Cardiology EP has been consulted for possible ILR.  family would like to discuss with them.  Just had a 2-week monitor done that showed no evidence of atrial fibrillation she only had a few brief episodes of rapid heart rate but no worrisome arrhythmias.  Also had a Zio patch after her stroke that showed no episodes of atrial fibrillation in 12/2020.  -Recent echo in June 2022 showed moderate aortic valve stenosis. -Neurology called by EDP and recommended EEG. This has been ordered, consult if any abnormal findings. Checking CK/mag as well.   Active Problems:   Essential hypertension -Decently well controlled. Hx of labile HTN.  -Continue home medication of amlodipine 5 mg daily, hydralazine 50 mg 3 times daily, and Imdur 30 mg daily    Bilateral carotid artery disease ([place]) -Status post left CEA in May 2017.  -Follows outpatient with vascular surgery    History of embolic stroke in 123456 and 2022 -Recent stroke in June 2022. -Continue aspirin and atorvastatin daily -Declined recent suggestion of Plavix/ASA therapy by neurologist yesterday.  Did complete a 3-week course of aspirin and Plavix after her stroke in June.    COPD with emphysema ([place]) -No signs of any COPD exacerbation. -Continue daily prednisone -Continue Trelegy, and as needed duo nebs  and albuterol    CKD (chronic kidney disease), stage III ([place]) -slightly above baseline and slightly dry on exam -small 246m bolus -follow renal panel     Chronic combined systolic and diastolic CHF (congestive heart failure) [place] -Last echo in June 2022 showed EF of 40 to 45% with mildly decreased left ventricular function.  Grade 1 diastolic dysfunction.  She also has moderate aortic valve stenosis. -She is euvolemic with no signs of volume overload or CHF exacerbation -We will  monitor I's and O's and weights. -Continue home medication of Lasix.  Has allergy to ACE inhibitor    Brain aneurysm -Neurosurgery discussed case with EDP and not a candidate for surgery due to advanced age and multiple comorbidities    Cervical spinal stenosis -Neurosurgery discussed with EDP and not a candidate for any intervention -Continue conservative therapy and may benefit from some PT    Hypothyroidism Continue home dose of Synthroid -Last TSH was in February 2022 and within normal limits    Mixed hyperlipidemia -Continue atorvastatin -Last lipid panel done June 2022 with LDL of 74    Weakness  -PT/OT eval/treat   Anxiety Continue her xanax TID  There is no height or weight on file to calculate BMI.    Level of care: Telemetry Cardiac DVT prophylaxis:  Lovenox  Code Status:  DNR - confirmed with patient/family Family Communication: daughter bedside: joan johnson.  Disposition Plan:  The patient is from: home  Anticipated d/c is to: home  Requires inpatient hospitalization and is at significant risk of worsening, requires constant monitoring, assessment and MDM with specialists.    Consults called: neurology in EDP and cardiology  Admission status:  observation   Orma Flaming MD [facility]   How to contact the [facility] Attending or Consulting provider [place] or covering provider during after hours [place], for this patient?  Check the care team in [facility] and look for a)  attending/consulting [facility] provider listed and b) the [facility] team listed Log into [email or web address] and use [place]'s universal password to access. If you do not have the password, please contact the hospital operator. Locate the [facility] provider you are looking for under [facility] and page to a number that you can be directly reached. If you still have difficulty reaching the provider, please page the [facility] (Director on Call) for the Hospitalists listed on amion for assistance.   02/27/2021, 1:55 PM

## 2021-02-27 NOTE — ED Notes (Signed)
Nurse report given to [facility], RN

## 2021-02-27 NOTE — Procedures (Signed)
Patient Name: [name]  MRN: [id number]  Epilepsy Attending: Lora Havens  Referring Physician/Provider: Dr Orma Flaming Date: 02/27/2021 Duration: 23.37 mins  Patient history: 87 year old female with an episode of syncope.  EEG to evaluate for seizures.  Level of alertness: Awake  AEDs during EEG study: None  Technical aspects: This EEG study was done with scalp electrodes positioned according to the 10-20 International system of electrode placement. Electrical activity was acquired at a sampling rate of '500Hz'$  and reviewed with a high frequency filter of '70Hz'$  and a low frequency filter of '1Hz'$ . EEG data were recorded continuously and digitally stored.   Description: The posterior dominant rhythm consists of 8.'5Hz'$  activity of moderate voltage (25-35 uV) seen predominantly in posterior head regions, symmetric and reactive to eye opening and eye closing. Hyperventilation and photic stimulation were not performed.     IMPRESSION: This study is within normal limits. No seizures or epileptiform discharges were seen throughout the recording.  Harvel Meskill Barbra Sarks

## 2021-02-27 NOTE — Plan of Care (Signed)
Patient seen by myself yesterday now presenting with an unexplained syncopal event.  Back to neurological baseline per verbal report from ED provider.  Not clearly primarily neurological event but for completeness do recommend a routine EEG.  If patient has further concern for acute neurological events, please activate a code stroke if appropriate or reach out to neurology if less emergent.  If routine EEG shows significant abnormalities, please do reach out to neurology for further guidance.  Neurology is happy to be available as needed.  Lesleigh Noe, MD-PhD [facility] [phone]  Available 7 AM to 7 PM, outside these hours please contact Neurologist on call listed on AMION

## 2021-02-27 NOTE — ED Notes (Addendum)
Pt provided with ginger ale after passing swallow screen and Dr Tamera Punt ok, family provided with graham crackers

## 2021-02-28 ENCOUNTER — Encounter: Payer: Self-pay | Admitting: Adult Health

## 2021-02-28 DIAGNOSIS — I350 Nonrheumatic aortic (valve) stenosis: Secondary | ICD-10-CM | POA: Diagnosis not present

## 2021-02-28 DIAGNOSIS — R55 Syncope and collapse: Secondary | ICD-10-CM | POA: Diagnosis not present

## 2021-02-28 DIAGNOSIS — I10 Essential (primary) hypertension: Secondary | ICD-10-CM | POA: Diagnosis not present

## 2021-02-28 LAB — BASIC METABOLIC PANEL
Anion gap: 6 (ref 5–15)
BUN: 25 mg/dL — ABNORMAL HIGH (ref 8–23)
CO2: 28 mmol/L (ref 22–32)
Calcium: 8.6 mg/dL — ABNORMAL LOW (ref 8.9–10.3)
Chloride: 105 mmol/L (ref 98–111)
Creatinine, Ser: 1.37 mg/dL — ABNORMAL HIGH (ref 0.44–1.00)
GFR, Estimated: 36 mL/min — ABNORMAL LOW (ref >60)
Glucose, Bld: 95 mg/dL (ref 70–99)
Potassium: 3.9 mmol/L (ref 3.5–5.1)
Sodium: 139 mmol/L (ref 135–145)

## 2021-02-28 MED ORDER — AMLODIPINE BESYLATE 5 MG PO TABS
5.0000 mg | ORAL_TABLET | Freq: Every day | ORAL | 3 refills | Status: DC
Start: 2021-02-28 — End: 2021-11-26

## 2021-02-28 MED ORDER — AMLODIPINE BESYLATE 5 MG PO TABS: 5.0000 mg | ORAL_TABLET | Freq: Every day | ORAL | Status: DC

## 2021-02-28 MED ORDER — CALCIUM CARBONATE ANTACID 500 MG PO CHEW
400.0000 mg | CHEWABLE_TABLET | Freq: Once | ORAL | Status: AC
  Administered 2021-02-28: 400 mg via ORAL
  Filled 2021-02-28: qty 2

## 2021-02-28 MED ORDER — ISOSORBIDE MONONITRATE ER 30 MG PO TB24: 30.0000 mg | ORAL_TABLET | Freq: Every day | ORAL | 3 refills | Status: DC

## 2021-02-28 MED ORDER — ISOSORBIDE MONONITRATE ER 30 MG PO TB24: 30.0000 mg | ORAL_TABLET | Freq: Every day | ORAL | Status: DC

## 2021-02-28 MED ORDER — PANTOPRAZOLE SODIUM 40 MG PO TBEC
40.0000 mg | DELAYED_RELEASE_TABLET | Freq: Every day | ORAL | Status: DC
  Administered 2021-02-28: 40 mg via ORAL
  Filled 2021-02-28: qty 1

## 2021-02-28 NOTE — Discharge Summary (Signed)
Physician Discharge Summary  KATHARINE GALM [id number] DOB: [birth year] DOA: 02/27/2021  PCP: [name], NP  Admit date: 02/27/2021 Discharge date: 02/28/2021  Time spent: 40 minutes  Recommendations for Outpatient Follow-up:  Follow outpatient CBC/CMP Attention to blood pressure outpatient - imdur and amlodipine converted to bedtime dosing - may need further adjustment, permissive hypertension if continued issues Repeat echo in 6 months for aortic stenosis - follow with cardiology outpatient  Consider neurology follow up pending goals of care Follow up with PCP regarding C spine findings (per EDP conversation with neurosurgery, unlikely they would intervene - see EDP Edelin Fryer/P) Continue palliative care follow up and goals of care conversations  Discharge Diagnoses:  Principal Problem:   Syncope Active Problems:   Hypothyroidism   Mixed hyperlipidemia   Essential hypertension   Weakness   Bilateral carotid artery disease ([place])   History of embolic stroke   COPD with emphysema ([place])   CKD (chronic kidney disease), stage III ([place])   Chronic combined systolic and diastolic CHF (congestive heart failure) ([place])   Brain aneurysm   Cervical spinal stenosis   Discharge Condition: stable  Diet recommendation: heart healthy  Filed Weights   02/27/21 1958 02/28/21 0455  Weight: 59.2 kg 56.8 kg    History of present illness:   [name] is [name] 87 y.o. female with medical history significant of hx of stroke, CKD stage 3, hypothyroidism, HLD, HTN, COPD on chronic steroids, bilateral carotid artery disease s/p left CEA, left ICA aneurysm who presented for possible syncopal event this morning. She woke up around 7:30am on the day of admission and felt like she had [name] hard time breathing and precipitates [name] panic attack and she took Alfa Leibensperger xanax and came downstairs. She was at the breakfast table and doing better. She then got hot and told her daughter she didn't feel good.  She then dropped the  paper, her head went back and was over the chair, eyes rolled back and then was staring straight up and then she stopped breathing.  Her daughter then called 911 and was talking to her mom with no response.  911 asked her to lay her down. She was completely unresponsive. When she tried to move her to the floor, she woke up and said, "No." EMS arrived and put her on oxygen and took her vitals. She was Trek Kimball little out of it and had some confusion. She was able to answer questions appropriately. Told EMS her chest hurt, but she doesn't remember any of this. Daughter states she was unresponsive for 4 minutes. Denies any shaking, loss of urine, tongue biting during episode. She denies any headache afterwards, but states she had [name] hard time talking.    She was seen the day prior to admission in ER for stroke like symptoms with right sided arm weakness and double vision. Neurology was consulted and w/u was negative for acute stroke. She does have [name] stable 1.7cmx 1.4cm left cavernous ICA aneurysm, but given her advanced age and comorbidities it is been felt that risk of intervention outweigh the benefits.  She was also noted to have quite significant cervical spine stenosis at C1, however do to her excellent function neurology felt that she could follow-up closely with neurosurgery outpatient for this.   She denies any headaches, vision changes, chest pain, palpitations, N/V/D, abdominal pain, leg swelling, dysuria. Right sided weakness has resolved. She has been eating and drinking well until this AM.    ED Course: vitals: afebrile, bp: 137/55,  HR: 58, RR: 16, oxygen: 99% 2L [place], but 96% RA Pertinent labs: Creatinine: 1.43 (1.17-1.39), Hgb: 11.1, Troponin 24--> 22(baseline: 40-60) CXR: chronic small pleural effusion, CTH: large left ICA aneurysm, MRI brain yesterday: no acute pathology, CTA: stable 1.7x 1.4cm left cavernous ICA aneurysm.  MIC cervical spine: Anterior translation of the C1 ring relative to C2 with  the posterior ring anteriorly subluxed into the canal. This results in moderate to severe canal stenosis at the level of the dens with mass effect on the cord. Neurosurgery called and would not do anything on her cervical spine. Neurology also consulted and will do EEG.  We were asked to admit.   She was admitted with syncope.  Cardiology was consulted.  EEG did not show seizures/epileptiform activity.  BP medication timing adjusted.  Plan for d/c with outpatient follow up.  See below for additional details  Hospital Course:  Syncope -87 year old with witnessed syncopal episode by daughter morning of admission.  Admit for syncopal work-up.  Just seen day prior to admission in ER for right-sided weakness with extensive stroke work-up that was negative. -orthostatics negative  -Cardiology EP has been consulted for possible ILR.  She was not interested after discussion with cardiology.     - adjusted medications - take imdur and amlodipine at night, keep others the same - EEG negative -Recent echo in June 2022 showed moderate aortic valve stenosis.  She needs repeat echo in 6 months. - follow outpatient with PCP, neurology, cardiology   Essential hypertension -Decently well controlled. Hx of labile HTN.  -Continue home medication of amlodipine 5 mg daily, hydralazine 50 mg 3 times daily, and Imdur 30 mg daily (as above, imdur and amlodipine now transitioned to nighttime)     Bilateral carotid artery disease ([name]) -Status post left CEA in May 2017.  -Follows outpatient with vascular surgery     History of embolic stroke in [id number] and 2022 -Recent stroke in June 2022. -Continue aspirin and atorvastatin daily -Declined recent suggestion of Plavix/ASA therapy by neurologist on day prior to admission.  Did complete Myli Pae 3-week course of aspirin and Plavix after her stroke in June. - discussed follow up with neurology, unclear that she's interested in follow up, but recommended considering       COPD with emphysema ([place]) -No signs of any COPD exacerbation. -Continue daily prednisone -Continue Trelegy, and as needed duo nebs and albuterol     CKD (chronic kidney disease), stage III ([place]) - follow outpatient      Chronic combined systolic and diastolic CHF (congestive heart failure)  Moderate Aortic Valve Stenosis -Last echo in June 2022 showed EF of 40 to 45% with mildly decreased left ventricular function.  Grade 1 diastolic dysfunction.  She also has moderate aortic valve stenosis. -She is euvolemic with no signs of volume overload or CHF exacerbation -needs repeat echo in 6 months - follow with cardiology outpatient      L ICA aneurysm -Neurology discussed during 12/2020 admission - not likely candidate for aggressive intervention per neurology (see 12/25/20 note) - follow up with neurology per goals of care     Cervical spinal stenosis -Neurosurgery discussed with EDP and not Shepard Keltz candidate for any intervention -Continue conservative therapy and may benefit from some PT - follow outpatient with PCP     Hypothyroidism Continue home dose of Synthroid -Last TSH was in February 2022 and within normal limits     Mixed hyperlipidemia -Continue atorvastatin -Last lipid panel done June 2022 with LDL of  74     Weakness  -PT/OT eval/treat    Anxiety Continue her xanax TID     Procedures: EEG IMPRESSION: This study is within normal limits. No seizures or epileptiform discharges were seen throughout the recording  Consultations: cards  Discharge Exam: Vitals:   02/28/21 0455 02/28/21 0806  BP: (!) 129/46   Pulse: (!) 47   Resp: 20   Temp: 98.9 F (37.2 C)   SpO2: 95% 97%   No complaints Can't remember what happened Daughter at bedside Discussed d/c plan  General: No acute distress. Cardiovascular: Heart sounds show Jordani Nunn regular rate, and rhythm.  Lungs: Clear to auscultation bilaterally  Abdomen: Soft, nontender, nondistended  Neurological: Alert and  oriented 3. Moves all extremities 4 with equal strength. Cranial nerves II through XII grossly intact. Skin: Warm and dry. No rashes or lesions. Extremities: No clubbing or cyanosis. No edema.  Discharge Instructions   Discharge Instructions     Call MD for:  difficulty breathing, headache or visual disturbances   Complete by: As directed    Call MD for:  extreme fatigue   Complete by: As directed    Call MD for:  hives   Complete by: As directed    Call MD for:  persistant dizziness or light-headedness   Complete by: As directed    Call MD for:  persistant nausea and vomiting   Complete by: As directed    Call MD for:  redness, tenderness, or signs of infection (pain, swelling, redness, odor or green/yellow discharge around incision site)   Complete by: As directed    Call MD for:  severe uncontrolled pain   Complete by: As directed    Call MD for:  temperature >100.4   Complete by: As directed    Diet - low sodium heart healthy   Complete by: As directed    Discharge instructions   Complete by: As directed    You were seen for an episode of syncope (fainting).    You've improved at this point.  It's unclear exactly what caused your fainting spell, cardiology suspects it was hypotension (low blood pressure related) vs neurocardiogenic (vagally mediated).  It may be related to your blood pressure medicines.  Take imdur and amlodipine at night and continue lasix and hydralazine as previously prescribed.  Your EEG did not show evidence of seizures.   You had cervical spinal stenosis noted on imaging.  Follow this outpatient with your PCP (the ED doctor spoke with neurosurgery who did not think there was an indication for intervention).    Please follow up with cardiology for Carlos Quackenbush repeat echo in 6 months to follow up your aortic stenosis.    You have Hashim Eichhorst follow up appointment with Dr. Oval Linsey in October.  Please follow up with your PCP within 1 week of discharge to follow up labs and  ensure your symptoms have improved.  Follow up with your outpatient providers as scheduled.  Return for new, recurrent, or worsening symptoms.  Please ask your PCP to request records from this hospitalization so they know what was done and what the next steps will be.   Increase activity slowly   Complete by: As directed       Allergies as of 02/28/2021       Reactions   Catapres [clonidine Hcl] Other (See Comments)   Made pt feel horrible, shaky, weak and nausea   Clonidine Other (See Comments)   Made pt feel horrible, shaky, weak and nausea  Lisinopril Anaphylaxis   Tongue swelling   Labetalol Other (See Comments)   Caused bradycardia and syncope   Labetalol Hcl Other (See Comments)   Caused bradycardia and syncope        Medication List     STOP taking these medications    ciprofloxacin 500 MG tablet Commonly known as: Cipro       TAKE these medications    albuterol 108 (90 Base) MCG/ACT inhaler Commonly known as: ProAir HFA Inhale 2 puffs into the lungs every 6 (six) hours as needed for wheezing or shortness of breath.   ALPRAZolam 0.5 MG tablet Commonly known as: Xanax Take 1 tablet (0.5 mg total) by mouth 2 (two) times daily. What changed: when to take this   amLODipine 5 MG tablet Commonly known as: NORVASC Take 1 tablet (5 mg total) by mouth at bedtime. What changed: when to take this   aspirin 81 MG EC tablet Take 1 tablet (81 mg total) by mouth daily. Swallow whole. What changed: when to take this   atorvastatin 80 MG tablet Commonly known as: LIPITOR TAKE 1 TABLET BY MOUTH EVERY DAY AT 6PM What changed:  how much to take how to take this when to take this   B-12 PO Take 1 tablet by mouth daily.   furosemide 20 MG tablet Commonly known as: LASIX Take 1 tablet (20 mg total) by mouth daily.   hydrALAZINE 50 MG tablet Commonly known as: APRESOLINE Take 1 tablet (50 mg total) by mouth 3 (three) times daily.   ipratropium-albuterol  0.5-2.5 (3) MG/3ML Soln Commonly known as: DUONEB Take 3 mLs by nebulization every 6 (six) hours as needed (copd).   isosorbide mononitrate 30 MG 24 hr tablet Commonly known as: IMDUR Take 1 tablet (30 mg total) by mouth at bedtime. What changed: when to take this   latanoprost 0.005 % ophthalmic solution Commonly known as: XALATAN Place 1 drop into both eyes at bedtime.   levothyroxine 75 MCG tablet Commonly known as: SYNTHROID Take 1 tablet (75 mcg total) by mouth daily before breakfast.   pantoprazole 40 MG tablet Commonly known as: PROTONIX TAKE 1 TABLET BY MOUTH EVERY DAY   potassium chloride SA 20 MEQ tablet Commonly known as: KLOR-CON Take 1 tablet (20 mEq total) by mouth daily.   predniSONE 10 MG tablet Commonly known as: DELTASONE Take 1 tablet (10 mg total) by mouth daily with breakfast.   Trelegy Ellipta 100-62.5-25 MCG/INH Aepb Generic drug: Fluticasone-Umeclidin-Vilant INHALE 1 PUFF BY MOUTH EVERY DAY What changed:  how much to take how to take this when to take this additional instructions       Allergies  Allergen Reactions   Catapres [Clonidine Hcl] Other (See Comments)    Made pt feel horrible, shaky, weak and nausea   Clonidine Other (See Comments)    Made pt feel horrible, shaky, weak and nausea   Lisinopril Anaphylaxis    Tongue swelling   Labetalol Other (See Comments)    Caused bradycardia and syncope   Labetalol Hcl Other (See Comments)    Caused bradycardia and syncope    Follow-up Information     [facility] [place] Follow up.   Specialty: Home Health Services Why: Registered Nurse, Physical Therapy, Occupational Therapy-office to call with visit times. Contact information: [address] [place] [address] [phone]                  The results of significant diagnostics from this hospitalization (  including imaging, microbiology, ancillary and laboratory) are listed below for reference.     Significant Diagnostic Studies: CT Head Wo Contrast  Result Date: 02/27/2021 CLINICAL DATA:  87 year old female with syncope. Cavernous left ICA aneurysm. EXAM: CT HEAD WITHOUT CONTRAST TECHNIQUE: Contiguous axial images were obtained from the base of the skull through the vertex without intravenous contrast. COMPARISON:  Brain MRI and CTA head and neck yesterday, and earlier. FINDINGS: Brain: No midline shift, ventriculomegaly, mass effect, evidence of mass lesion, intracranial hemorrhage or evidence of cortically based acute infarction. Gray-white matter differentiation is stable and remains largely normal for age. Vascular: Calcified atherosclerosis at the skull base. Asymmetric cavernous sinus density and mass effect secondary to the known 1.8 cm large ICA aneurysm demonstrated by CTA yesterday. No suspicious intracranial vascular hyperdensity. Skull: No acute osseous abnormality identified. Sinuses/Orbits: Stable mild right mastoid effusion. Visualized paranasal sinuses are stable and well aerated. Other: No acute orbit or scalp soft tissue finding. IMPRESSION: 1. No acute intracranial abnormality. Large Left ICA aneurysm as demonstrated by CTA yesterday. 2. Chronic mild right mastoid effusion. Electronically Signed   By: Genevie Ann M.D.   On: 02/27/2021 10:48   MR BRAIN WO CONTRAST  Result Date: 02/26/2021 CLINICAL DATA:  Neuro deficit, acute stroke suspected EXAM: MRI HEAD WITHOUT CONTRAST TECHNIQUE: Multiplanar, multiecho pulse sequences of the brain and surrounding structures were obtained without intravenous contrast. COMPARISON:  Same day CT/CTA head FINDINGS: Brain: There is no evidence of acute intracranial hemorrhage, extra-axial fluid collection, or infarct. Kree Armato few punctate foci of chronic microhemorrhage are not significantly changed, nonspecific. Scattered foci FLAIR signal abnormality in the subcortical and periventricular white matter are nonspecific but likely reflect sequelae of chronic  white matter microangiopathy, unchanged. Global parenchymal volume loss with commensurate enlargement of the ventricular system is unchanged. There is no midline shift. No mass lesion is identified. Vascular: Fredna Stricker large left cavernous ICA aneurysm is seen, better evaluated on the same-day CT head. The flow voids are otherwise unremarkable. Skull and upper cervical spine: Normal marrow signal. There is degenerative change at the craniocervical junction resulting in moderate to severe stenosis with mass effect on the cervicomedullary junction. Sinuses/Orbits: The paranasal sinuses are clear. There is Lizmary Nader large right mastoid effusion, present since at least 12/25/2020. The patient is status post bilateral lens implant. The globes and orbits are otherwise unremarkable. Other: None. IMPRESSION: 1. No acute intracranial pathology. 2. Unchanged global parenchymal volume loss and chronic white matter microangiopathy. 3. Right mastoid effusion, present since at least 12/25/2020. 4. The left ICA aneurysm is better evaluated on the same day CTA head. Electronically Signed   By: Valetta Mole M.D.   On: 02/26/2021 17:26   MR CERVICAL SPINE WO CONTRAST  Result Date: 02/26/2021 CLINICAL DATA:  Myelopathy, acute or progressive EXAM: MRI CERVICAL SPINE WITHOUT CONTRAST TECHNIQUE: Multiplanar, multisequence MR imaging of the cervical spine was performed. No intravenous contrast was administered. COMPARISON:  Same-day CT Adreena Willits neck, brain MRI and CT Yula Crotwell neck 11/21/2015 FINDINGS: Alignment: There is straightening of the normal cervical spine lordosis. There is trace anterolisthesis C3 on C4 and C4 on C5, likely degenerative in nature. There is no jumped or perched facet. Vertebrae: Vertebral body heights are preserved. Marrow signal is within normal limits. Cord: Cord signal is normal; however, note that the cervicomedullary junction is incompletely imaged in the axial plane. Posterior Fossa, vertebral arteries, paraspinal tissues: The brain  and posterior fossa are better assessed on the separately dictated brain MRI. The vertebral artery  flow voids are present, better assessed on the same day CT neck. The paraspinal soft tissues are unremarkable. There are multiple thyroid nodules measuring up to 7 mm on the right. Not clinically significant; no follow-up imaging recommended (ref: J Am Coll Radiol. 2015 Feb;12(2): 143-50). Disc levels: There is marked multilevel disc desiccation and narrowing, most advanced at C5-C6 and C6-C7. There is mild diffuse congenital narrowing of the cervical spine with superimposed degenerative changes below. Craniocervical junction: There is degenerative pannus about the dens. There is anterior subluxation of the C1 ring relative to C2 with the posterior arch anteriorly subluxed towards the canal. This results in moderate to severe stenosis of the canal at the level of the dens with mass effect on the cervicomedullary junction. This appears slightly worsened compared to the brain MRI of 11/21/2015. No definite cord signal abnormality is identified. C2-C3: There is Dominique Ressel broad-based posterior disc osteophyte complex, ligamentum flavum thickening, and uncovertebral and facet arthropathy resulting in mild spinal canal stenosis and mild bilateral neural foraminal stenosis. C3-C4: There is Monterrio Gerst prominent posterior disc osteophyte complex, ligamentum flavum thickening, and uncovertebral and facet arthropathy resulting in mild spinal canal stenosis with partial effacement of the ventral thecal sac and severe right and moderate left neural foraminal stenosis. C4-C5: There is Hermes Wafer posterior disc osteophyte complex, ligamentum flavum thickening, and uncovertebral and facet arthropathy resulting in mild spinal canal stenosis with partial effacement of the ventral thecal sac and severe bilateral neural foraminal stenosis. C5-C6: There is Da Authement posterior disc osteophyte complex with Kameshia Madruga prominent right paracentral/foraminal protrusion, ligamentum flavum  thickening, and uncovertebral and facet arthropathy resulting in mild-to-moderate spinal canal stenosis with mild mass effect on the cord (worse in the right aspect of the canal) and severe bilateral neural foraminal stenosis. C6-C7: There is Annelle Behrendt posterior disc osteophyte complex, ligamentum flavum thickening, and uncovertebral and facet arthropathy resulting in mild spinal canal stenosis and severe bilateral neural foraminal stenosis C7-T1: No high-grade spinal canal or neural foraminal stenosis. IMPRESSION: 1. Anterior translation of the C1 ring relative to C2 with the posterior ring anteriorly subluxed into the canal. This results in moderate to severe canal stenosis at the level of the dens with mass effect on the cord. No definite cord signal abnormality identified. The stenosis appears slightly worsened compared to the brain MRI and CT Montrice Gracey neck from 11/21/2015. 2. Advanced multilevel degenerative changes throughout the remainder of the cervical spine detailed above resulting in mild-to-moderate spinal canal stenosis at C5-C6 and severe neural foraminal stenosis at numerous levels. Electronically Signed   By: Valetta Mole M.D.   On: 02/26/2021 17:40   DG Chest Port 1 View  Result Date: 02/27/2021 CLINICAL DATA:  87 year old female with shortness of breath. EXAM: PORTABLE CHEST 1 VIEW COMPARISON:  Chest radiographs 11/29/2020 and earlier. FINDINGS: Portable AP semi upright view at 1010 hours. Chronic blunting of the left costophrenic angle appears related to persistent pleural effusion seen by CTA in March. Stable cardiac size and mediastinal contours. Calcified aortic atherosclerosis. Visualized tracheal air column is within normal limits. No pneumothorax, pulmonary edema or consolidation. Upper lobe predominant emphysema and mild lung base scarring are again evident. No acute osseous abnormality identified. IMPRESSION: 1. Chronic small left pleural effusion. No acute cardiopulmonary abnormality. 2. Aortic  Atherosclerosis (ICD10-I70.0) and Emphysema (ICD10-J43.9). Electronically Signed   By: Genevie Ann M.D.   On: 02/27/2021 10:43   EEG adult  Result Date: 02/27/2021 Lora Havens, MD     02/27/2021  4:33 PM Patient Name: Aizlyn  BIRDA FRYE MRN: [id number] Epilepsy Attending: Lora Havens Referring Physician/Provider: Dr Orma Flaming Date: 02/27/2021 Duration: 23.37 mins Patient history: 87 year old female with an episode of syncope.  EEG to evaluate for seizures. Level of alertness: Awake AEDs during EEG study: None Technical aspects: This EEG study was done with scalp electrodes positioned according to the 10-20 International system of electrode placement. Electrical activity was acquired at Markise Haymer sampling rate of '500Hz'$  and reviewed with Odena Mcquaid high frequency filter of '70Hz'$  and Rateel Beldin low frequency filter of '1Hz'$ . EEG data were recorded continuously and digitally stored. Description: The posterior dominant rhythm consists of 8.'5Hz'$  activity of moderate voltage (25-35 uV) seen predominantly in posterior head regions, symmetric and reactive to eye opening and eye closing. Hyperventilation and photic stimulation were not performed.   IMPRESSION: This study is within normal limits. No seizures or epileptiform discharges were seen throughout the recording. Lora Havens   CT HEAD CODE STROKE WO CONTRAST  Result Date: 02/26/2021 CLINICAL DATA:  Code stroke.  Abnormal sensation right hand EXAM: CT HEAD WITHOUT CONTRAST TECHNIQUE: Contiguous axial images were obtained from the base of the skull through the vertex without intravenous contrast. COMPARISON:  12/23/2020 FINDINGS: Brain: No acute intracranial hemorrhage, mass effect, or edema. No new loss of gray-white differentiation. Ventricles are stable in size. No extra-axial collection. Vascular: There is intracranial atherosclerotic calcification at the skull base. No hyperdense vessel. Soft tissue along left carotid reflecting known aneurysm. Skull: No new finding.  Sinuses/Orbits: No acute abnormality. Other: Persistent patchy right mastoid opacification. ASPECTS ([place] Stroke Program Early CT Score) - Ganglionic level infarction (caudate, lentiform nuclei, internal capsule, insula, M1-M3 cortex): 7 - Supraganglionic infarction (M4-M6 cortex): 3 Total score (0-10 with 10 being normal): 10 IMPRESSION: There is no acute intracranial hemorrhage or evidence of acute infarction. ASPECT score is 10. These results were communicated to Dr. Curly Shores at 1:54 pm on 02/26/2021 by text page via the [place] [facility] messaging system. Electronically Signed   By: Macy Mis M.D.   On: 02/26/2021 13:56   CT ANGIO HEAD NECK W WO CM (CODE STROKE)  Result Date: 02/26/2021 CLINICAL DATA:  Code stroke.  Abnormal sensation in the right hand. EXAM: CT ANGIOGRAPHY HEAD AND NECK TECHNIQUE: Multidetector CT imaging of the head and neck was performed using the standard protocol during bolus administration of intravenous contrast. Multiplanar CT image reconstructions and MIPs were obtained to evaluate the vascular anatomy. Carotid stenosis measurements (when applicable) are obtained utilizing NASCET criteria, using the distal internal carotid diameter as the denominator. CONTRAST:  30m OMNIPAQUE IOHEXOL 350 MG/ML SOLN COMPARISON:  CT head without contrast 02/26/2021. MR head without contrast 12/25/2020. CTA of the head and neck 12/23/2020. FINDINGS: CTA NECK FINDINGS Aortic arch: Marked atherosclerotic disease is present in the aorta. Significant irregular noncalcified plaque is present in the distal arch and proximal descending thoracic aorta without significant change. Atherosclerotic calcifications are present at the origins the great vessels without significant stenosis. No aneurysm is present. Right carotid system: Mural calcifications are again noted within the distal right common carotid artery without significant stenosis. Additional calcifications are present in the proximal right ICA without  significant stenosis or change. Left carotid system: Atherosclerotic changes are noted in the walls of the distal left common carotid artery. Dilated carotid bulb consistent with previous endarterectomy noted. No residual recurrent stenosis is present. Cervical left ICA is otherwise normal, larger than on the right. Vertebral arteries: The right vertebral artery is the dominant vessel. Atherosclerotic changes are present at the  origins of both vertebral arteries without significant stenosis. No significant stenosis is present in either vertebral artery in the neck. Skeleton: Stable multilevel degenerative changes are present. No acute osseous abnormality is present. Other neck: Soft tissues the neck are otherwise unremarkable. Salivary glands are within normal limits. Thyroid is normal. No significant adenopathy is present. No focal mucosal or submucosal lesions are present. Upper chest: Mild centrilobular emphysematous changes are present. Raylin Diguglielmo left pleural effusion has increased since the prior study. Scattered ground-glass attenuation is present. No focal nodule or mass lesion is present. Review of the MIP images confirms the above findings CTA HEAD FINDINGS Anterior circulation: The left cavernous ICA aneurysm is stable, measuring 1.7 x 1.4 cm on axial images. There is Brandis Matsuura wide necked of this aneurysm. Left ICA continues to be of larger caliber than on the right. No significant intracranial stenosis or large vessel occlusion is present. The MCA bifurcations are intact. ACA and MCA branch vessels are unremarkable. Posterior circulation: The right vertebral artery is dominant. Minimal calcifications are present in the V4 segment without significant stenosis. The hypoplastic left vertebral artery bifurcates at the PICA. Basilar artery is normal. Both posterior cerebral arteries originate from basilar tip. Prominent right posterior communicating artery contributes. The PCA branch vessels are within normal limits  bilaterally. Venous sinuses: The dural sinuses are patent. The straight sinus deep cerebral veins are intact. Cortical veins are within normal limits. No significant vascular malformation is evident. Anatomic variants: None Review of the MIP images confirms the above findings IMPRESSION: 1. No emergent large vessel occlusion. 2. Stable 1.7 x 1.4 cm left cavernous ICA aneurysm. 3. No significant proximal stenosis, new aneurysm, or branch vessel occlusion within the Circle of Willis. 4. Prior left carotid endarterectomy without residual or recurrent stenosis. 5. Extensive atherosclerotic changes in the carotid arteries bilaterally without significant stenosis in the neck. 6. Aortic Atherosclerosis (ICD10-I70.0) and Emphysema (ICD10-J43.9). Electronically Signed   By: San Morelle M.D.   On: 02/26/2021 14:14    Microbiology: Recent Results (from the past 240 hour(s))  SARS CORONAVIRUS 2 (TAT 6-24 HRS) Nasopharyngeal Nasopharyngeal Swab     Status: None   Collection Time: 02/26/21  3:23 PM   Specimen: Nasopharyngeal Swab  Result Value Ref Range Status   SARS Coronavirus 2 NEGATIVE NEGATIVE Final    Comment: (NOTE) SARS-CoV-2 target nucleic acids are NOT DETECTED.  The SARS-CoV-2 RNA is generally detectable in upper and lower respiratory specimens during the acute phase of infection. Negative results do not preclude SARS-CoV-2 infection, do not rule out co-infections with other pathogens, and should not be used as the sole basis for treatment or other patient management decisions. Negative results must be combined with clinical observations, patient history, and epidemiological information. The expected result is Negative.  Fact Sheet for Patients: SugarRoll.be  Fact Sheet for Healthcare Providers: https://www.woods-mathews.com/  This test is not yet approved or cleared by the [place] FDA and  has been authorized for detection and/or  diagnosis of SARS-CoV-2 by FDA under an Emergency Use Authorization (EUA). This EUA will remain  in effect (meaning this test can be used) for the duration of the COVID-19 declaration under Se ction 564(b)(1) of the Act, 21 U.S.C. section 360bbb-3(b)(1), unless the authorization is terminated or revoked sooner.  Performed at [facility], [place] [address] [place], [place] [address]   SARS CORONAVIRUS 2 (TAT 6-24 HRS) Nasopharyngeal Nasopharyngeal Swab     Status: None   Collection Time: 02/27/21 12:50 PM  Specimen: Nasopharyngeal Swab  Result Value Ref Range Status   SARS Coronavirus 2 NEGATIVE NEGATIVE Final    Comment: (NOTE) SARS-CoV-2 target nucleic acids are NOT DETECTED.  The SARS-CoV-2 RNA is generally detectable in upper and lower respiratory specimens during the acute phase of infection. Negative results do not preclude SARS-CoV-2 infection, do not rule out co-infections with other pathogens, and should not be used as the sole basis for treatment or other patient management decisions. Negative results must be combined with clinical observations, patient history, and epidemiological information. The expected result is Negative.  Fact Sheet for Patients: SugarRoll.be  Fact Sheet for Healthcare Providers: https://www.woods-mathews.com/  This test is not yet approved or cleared by the [place] FDA and  has been authorized for detection and/or diagnosis of SARS-CoV-2 by FDA under an Emergency Use Authorization (EUA). This EUA will remain  in effect (meaning this test can be used) for the duration of the COVID-19 declaration under Se ction 564(b)(1) of the Act, 21 U.S.C. section 360bbb-3(b)(1), unless the authorization is terminated or revoked sooner.  Performed at [facility], [place] [address], [place], [place] [address]      Labs: Basic Metabolic Panel: Recent Labs  Lab 02/26/21 1339 02/26/21 1344  02/27/21 0958 02/27/21 1825 02/28/21 0239  NA 137 138 140  --  139  K 4.0 3.9 3.6  --  3.9  CL 104 102 104  --  105  CO2 25  --  30  --  28  GLUCOSE 170* 169* 107*  --  95  BUN '19 21 21  '$ --  25*  CREATININE 1.32* 1.20* 1.43*  --  1.37*  CALCIUM 8.8*  --  8.8*  --  8.6*  MG  --   --   --  2.3  --    Liver Function Tests: Recent Labs  Lab 02/26/21 1339  AST 20  ALT 17  ALKPHOS 95  BILITOT 0.9  PROT 6.0*  ALBUMIN 3.6   No results for input(s): LIPASE, AMYLASE in the last 168 hours. No results for input(s): AMMONIA in the last 168 hours. CBC: Recent Labs  Lab 02/26/21 1339 02/26/21 1344 02/27/21 0958  WBC 6.4  --  5.2  NEUTROABS 4.9  --  3.1  HGB 11.9* 12.2 11.1*  HCT 37.7 36.0 35.9*  MCV 93.3  --  95.0  PLT 193  --  165   Cardiac Enzymes: Recent Labs  Lab 02/27/21 1825  CKTOTAL 98   BNP: BNP (last 3 results) Recent Labs    11/17/20 0909 11/21/20 1911 11/29/20 1224  BNP 392.3* 435.1* 446.3*    ProBNP (last 3 results) No results for input(s): PROBNP in the last 8760 hours.  CBG: No results for input(s): GLUCAP in the last 168 hours.     Signed:  Fayrene Helper MD.  [facility] 02/28/2021, 9:20 PM

## 2021-02-28 NOTE — Care Management Obs Status (Signed)
[place] NOTIFICATION   Patient Details  Name: [name] MRN: [id number] Date of Birth: January 10, 1925   Medicare Observation Status Notification Given:  Yes    Bethena Roys, RN 02/28/2021, 12:27 PM

## 2021-02-28 NOTE — Care Management (Signed)
1301 02-28-21 Case Manager spoke with the patient's daughter regarding home health services. Patient is currently active with [facility] services for Registered Nurse, Physical Therapy, and Occupational Therapy. Case Manager called [facility] and they will follow the patient post hospitalization. Patient has durable medical equipment (DME) BSC, stair lift, rolling walker, and a gait belt in the home. Per daughter the patient lives alone and the family checks in on the patient. Case Manager did make the RN aware to speak with family before d/c regarding that the patient will initially need more supervision once she gets home.

## 2021-02-28 NOTE — Plan of Care (Signed)
  Problem: Education: Goal: Knowledge of condition and prescribed therapy will improve 02/28/2021 0225 by Kerrie Pleasure, RN Outcome: Progressing 02/28/2021 0224 by Kerrie Pleasure, RN Outcome: Progressing   Problem: Cardiac: Goal: Will achieve and/or maintain adequate cardiac output 02/28/2021 0225 by Kerrie Pleasure, RN Outcome: Progressing 02/28/2021 0224 by Kerrie Pleasure, RN Outcome: Progressing   Problem: Physical Regulation: Goal: Complications related to the disease process, condition or treatment will be avoided or minimized 02/28/2021 0225 by Kerrie Pleasure, RN Outcome: Progressing 02/28/2021 0224 by Kerrie Pleasure, RN Outcome: Progressing   Problem: Health Behavior/Discharge Planning: Goal: Ability to manage health-related needs will improve Outcome: Progressing   Problem: Clinical Measurements: Goal: Will remain free from infection Outcome: Progressing   Problem: Activity: Goal: Risk for activity intolerance will decrease Outcome: Progressing

## 2021-02-28 NOTE — Progress Notes (Signed)
Occupational Therapy Evaluation Patient Details Name: [name] MRN: [id number] DOB: 01-21-1925 Today's Date: 02/28/2021    History of Present Illness 87 y.o. female presented to ED via EMS on 02/28/2021 after period of unresponsiveness  and onset of stroke like symptoms of right-sided weakness and double vision.  Neurology was consulted and w/u was negative for acute stroke.  Admitted for observation for treatment of syncope.  PMH significant for prior CVA  6/22 and 2015, former tobacco user COPD, CKD 3B, chronic anxiety, hypertension, hyperlipidemia, hypothyroidism, GERD,   Clinical Impression   Pt presents with deficits above. PTA pt PLOF living at home with assistance from PCA ~2x/week and family support with IADLs, Pt is mostly Mod I with use of AE within home to required support from family for safety with IADLs. Pt is currently limited with safe ADL engagement due to weakness, decreased activity tolerance, decreased functional stability, and safety awareness with min guard for functional transfers and constant cueing for safe hand placement and sequencing with AE. Pt will benefit from additional skilled level PT to address safety with ADLs, increased strength, compensatory strategies, and safety awareness. DC recommendation to home with HHOT and 24 hour supervision for safety within home environment.     Follow Up Recommendations  Home health OT;Supervision/Assistance - 24 hour    Equipment Recommendations       Recommendations for Other Services       Precautions / Restrictions Precautions Precautions: Fall Restrictions Weight Bearing Restrictions: No      Mobility Bed Mobility               General bed mobility comments: pt received sitting in chair upon arrival    Transfers Overall transfer level: Needs assistance Equipment used: Rolling walker (2 wheeled) Transfers: Sit to/from Stand Sit to Stand: Supervision         General transfer comment:  supervision for safety, verbal cues/reminders for safe hand placement    Balance Overall balance assessment: Mild deficits observed, not formally tested                                         ADL either performed or assessed with clinical judgement   ADL Overall ADL's : Needs assistance/impaired Eating/Feeding: Independent   Grooming: Min guard;Standing (with RW)           Upper Body Dressing : Set up;Sitting   Lower Body Dressing: Supervision/safety;Sitting/lateral leans Lower Body Dressing Details (indicate cue type and reason): Pt demonstrated donning/doffing of B socks, by reaching forward in sitting to doff and present LE's to figure 4 position to don. Toilet Transfer: Min guard;Ambulation;RW Toilet Transfer Details (indicate cue type and reason): simulated toilet transfer from recliner to bathroom door and back to recliner. Heavy cueing and reminders for safety with sequencing and hand placement with RW. specifially sit to stand and stand to sit transitions. Toileting- Clothing Manipulation and Hygiene: Supervision/safety;Sit to/from stand               Vision         Perception     Praxis      Pertinent Vitals/Pain Pain Assessment: No/denies pain     Hand Dominance Right   Extremity/Trunk Assessment Upper Extremity Assessment Upper Extremity Assessment: Overall WFL for tasks assessed   Lower Extremity Assessment Lower Extremity Assessment: Defer to PT evaluation       Communication  Communication Communication: HOH   Cognition Arousal/Alertness: Awake/alert Behavior During Therapy: WFL for tasks assessed/performed Overall Cognitive Status: Within Functional Limits for tasks assessed                                     General Comments  BP level assessed at 116/59 sitting and 115/59 in standing.    Exercises     Shoulder Instructions      Home Living Family/patient expects to be discharged to:: Private  residence Living Arrangements: Alone Available Help at Discharge: Family;Personal care attendant;Available PRN/intermittently Type of Home: House Home Access: Ramped entrance     Home Layout: Multi-level Alternate Level Stairs-Number of Steps: has chair lift to all 3 levels (split level home) (walker at every level) Alternate Level Stairs-Rails: Left Bathroom Shower/Tub: Occupational psychologist: Handicapped height Bathroom Accessibility: Yes   Home Equipment: Clinical cytogeneticist - 2 wheels;Transport chair;Cane - single point;Grab bars - tub/shower;Grab bars - toilet;Other (comment) (chair lifts and RW on all levels)   Additional Comments: Reports having a PCA to assist with light meals for ~2x/week, family assists with cleaning/laundry/ and driving.      Prior Functioning/Environment Level of Independence: Needs assistance  Gait / Transfers Assistance Needed: Very short distance ambulation with RW. Uses transport chair when out of home. ADL's / Homemaking Assistance Needed: Assistance for bathing and dressing   Comments: iADLs family provides        OT Problem List: Decreased strength;Decreased range of motion;Decreased activity tolerance;Impaired balance (sitting and/or standing);Decreased safety awareness;Decreased knowledge of use of DME or AE;Decreased knowledge of precautions      OT Treatment/Interventions: Self-care/ADL training;Therapeutic exercise;DME and/or AE instruction;Therapeutic activities;Patient/family education;Balance training    OT Goals(Current goals can be found in the care plan section) Acute Rehab OT Goals Patient Stated Goal: go home OT Goal Formulation: With patient Time For Goal Achievement: 03/14/21 Potential to Achieve Goals: Good  OT Frequency: Min 1X/week   Barriers to D/C:            Co-evaluation              AM-PAC OT "6 Clicks" Daily Activity     Outcome Measure Help from another person eating meals?: None Help from  another person taking care of personal grooming?: A Little Help from another person toileting, which includes using toliet, bedpan, or urinal?: A Little Help from another person bathing (including washing, rinsing, drying)?: A Little Help from another person to put on and taking off regular upper body clothing?: A Little Help from another person to put on and taking off regular lower body clothing?: A Little 6 Click Score: 19   End of Session Equipment Utilized During Treatment: Gait belt;Rolling walker Nurse Communication: Mobility status  Activity Tolerance: Patient tolerated treatment well Patient left: in chair;with call bell/phone within reach;with chair alarm set  OT Visit Diagnosis: Unsteadiness on feet (R26.81);Dizziness and giddiness (R42);History of falling (Z91.81)                Time: 1203-1220 OT Time Calculation (min): 17 min Charges:  OT General Charges $OT Visit: 1 Visit OT Evaluation $OT Eval Low Complexity: [place], MSOT, OTR/L  Supplemental Rehabilitation Services  [phone]   Marius Ditch 02/28/2021, 1:30 PM

## 2021-02-28 NOTE — Progress Notes (Signed)
Progress Note  Patient Name: [name] Date of Encounter: 02/28/2021  [facility] Cardiologist: Skeet Latch, MD   Subjective   Feels really well this morning.  Sitting up in a chair at the bedside.  No recurrent lightheadedness.  No shortness of breath.  Had some reflux type symptoms and is asking about taking a Tums.  Otherwise no complaints  Inpatient Medications    Scheduled Meds:  ALPRAZolam  0.5 mg Oral TID   amLODipine  5 mg Oral Q1200   aspirin EC  81 mg Oral QHS   atorvastatin  80 mg Oral QHS   enoxaparin (LOVENOX) injection  30 mg Subcutaneous Q24H   fluticasone furoate-vilanterol  1 puff Inhalation Daily   furosemide  20 mg Oral Daily   hydrALAZINE  50 mg Oral TID   isosorbide mononitrate  30 mg Oral Daily   latanoprost  1 drop Both Eyes QHS   levothyroxine  75 mcg Oral QAC breakfast   pantoprazole  40 mg Oral Q1200   potassium chloride SA  20 mEq Oral Daily   predniSONE  10 mg Oral Q breakfast   sodium chloride flush  3 mL Intravenous Q12H   umeclidinium bromide  1 puff Inhalation Daily   Continuous Infusions:  sodium chloride     PRN Meds: sodium chloride, acetaminophen **OR** acetaminophen, albuterol, ipratropium-albuterol, sodium chloride flush   Vital Signs    Vitals:   02/27/21 2125 02/28/21 0124 02/28/21 0455 02/28/21 0806  BP: (!) 144/68 (!) 99/57 (!) 129/46   Pulse: 72 (!) 58 (!) 47   Resp: (!) '23 20 20   '$ Temp: 98.4 F (36.9 C)  98.9 F (37.2 C)   TempSrc: Oral  Oral   SpO2: 96% 92% 95% 97%  Weight:   56.8 kg   Height:        Intake/Output Summary (Last 24 hours) at 02/28/2021 0949 Last data filed at 02/27/2021 2025 Gross per 24 hour  Intake --  Output 200 ml  Net -200 ml   Last 3 Weights 02/28/2021 02/27/2021 02/26/2021  Weight (lbs) 125 lb 4.8 oz 130 lb 8 oz 128 lb 12 oz  Weight (kg) 56.836 kg 59.194 kg 58.4 kg      Telemetry    Normal sinus rhythm, few PACs, no pauses or atrial fibrillation identified - Personally  Reviewed   Physical Exam  Pleasant elderly woman in no distress GEN: No acute distress.   Neck: No JVD Cardiac: RRR, 3/6 crescendo decrescendo murmur at the right upper sternal border Respiratory: Clear to auscultation bilaterally. GI: Soft, nontender, non-distended  MS: No edema; No deformity. Neuro:  Nonfocal  Psych: Normal affect   Labs    High Sensitivity Troponin:   Recent Labs  Lab 02/27/21 0958 02/27/21 1248  TROPONINIHS 24* 22*      Chemistry Recent Labs  Lab 02/26/21 1339 02/26/21 1344 02/27/21 0958 02/28/21 0239  NA 137 138 140 139  K 4.0 3.9 3.6 3.9  CL 104 102 104 105  CO2 25  --  30 28  GLUCOSE 170* 169* 107* 95  BUN '19 21 21 '$ 25*  CREATININE 1.32* 1.20* 1.43* 1.37*  CALCIUM 8.8*  --  8.8* 8.6*  PROT 6.0*  --   --   --   ALBUMIN 3.6  --   --   --   AST 20  --   --   --   ALT 17  --   --   --   [facility]  95  --   --   --   BILITOT 0.9  --   --   --   GFRNONAA 38*  --  34* 36*  ANIONGAP 8  --  6 6     Hematology Recent Labs  Lab 02/26/21 1339 02/26/21 1344 02/27/21 0958  WBC 6.4  --  5.2  RBC 4.04  --  3.78*  HGB 11.9* 12.2 11.1*  HCT 37.7 36.0 35.9*  MCV 93.3  --  95.0  MCH 29.5  --  29.4  MCHC 31.6  --  30.9  RDW 13.0  --  13.2  PLT 193  --  165    BNPNo results for input(s): BNP, PROBNP in the last 168 hours.   DDimer No results for input(s): DDIMER in the last 168 hours.   Radiology    CT Head Wo Contrast  Result Date: 02/27/2021 CLINICAL DATA:  86 year old female with syncope. Cavernous left ICA aneurysm. EXAM: CT HEAD WITHOUT CONTRAST TECHNIQUE: Contiguous axial images were obtained from the base of the skull through the vertex without intravenous contrast. COMPARISON:  Brain MRI and CTA head and neck yesterday, and earlier. FINDINGS: Brain: No midline shift, ventriculomegaly, mass effect, evidence of mass lesion, intracranial hemorrhage or evidence of cortically based acute infarction. Gray-white matter differentiation is stable  and remains largely normal for age. Vascular: Calcified atherosclerosis at the skull base. Asymmetric cavernous sinus density and mass effect secondary to the known 1.8 cm large ICA aneurysm demonstrated by CTA yesterday. No suspicious intracranial vascular hyperdensity. Skull: No acute osseous abnormality identified. Sinuses/Orbits: Stable mild right mastoid effusion. Visualized paranasal sinuses are stable and well aerated. Other: No acute orbit or scalp soft tissue finding. IMPRESSION: 1. No acute intracranial abnormality. Large Left ICA aneurysm as demonstrated by CTA yesterday. 2. Chronic mild right mastoid effusion. Electronically Signed   By: Genevie Ann M.D.   On: 02/27/2021 10:48   MR BRAIN WO CONTRAST  Result Date: 02/26/2021 CLINICAL DATA:  Neuro deficit, acute stroke suspected EXAM: MRI HEAD WITHOUT CONTRAST TECHNIQUE: Multiplanar, multiecho pulse sequences of the brain and surrounding structures were obtained without intravenous contrast. COMPARISON:  Same day CT/CTA head FINDINGS: Brain: There is no evidence of acute intracranial hemorrhage, extra-axial fluid collection, or infarct. A few punctate foci of chronic microhemorrhage are not significantly changed, nonspecific. Scattered foci FLAIR signal abnormality in the subcortical and periventricular white matter are nonspecific but likely reflect sequelae of chronic white matter microangiopathy, unchanged. Global parenchymal volume loss with commensurate enlargement of the ventricular system is unchanged. There is no midline shift. No mass lesion is identified. Vascular: A large left cavernous ICA aneurysm is seen, better evaluated on the same-day CT head. The flow voids are otherwise unremarkable. Skull and upper cervical spine: Normal marrow signal. There is degenerative change at the craniocervical junction resulting in moderate to severe stenosis with mass effect on the cervicomedullary junction. Sinuses/Orbits: The paranasal sinuses are clear.  There is a large right mastoid effusion, present since at least 12/25/2020. The patient is status post bilateral lens implant. The globes and orbits are otherwise unremarkable. Other: None. IMPRESSION: 1. No acute intracranial pathology. 2. Unchanged global parenchymal volume loss and chronic white matter microangiopathy. 3. Right mastoid effusion, present since at least 12/25/2020. 4. The left ICA aneurysm is better evaluated on the same day CTA head. Electronically Signed   By: Valetta Mole M.D.   On: 02/26/2021 17:26   MR CERVICAL SPINE WO CONTRAST  Result Date: 02/26/2021 CLINICAL  DATA:  Myelopathy, acute or progressive EXAM: MRI CERVICAL SPINE WITHOUT CONTRAST TECHNIQUE: Multiplanar, multisequence MR imaging of the cervical spine was performed. No intravenous contrast was administered. COMPARISON:  Same-day CT a neck, brain MRI and CT a neck 11/21/2015 FINDINGS: Alignment: There is straightening of the normal cervical spine lordosis. There is trace anterolisthesis C3 on C4 and C4 on C5, likely degenerative in nature. There is no jumped or perched facet. Vertebrae: Vertebral body heights are preserved. Marrow signal is within normal limits. Cord: Cord signal is normal; however, note that the cervicomedullary junction is incompletely imaged in the axial plane. Posterior Fossa, vertebral arteries, paraspinal tissues: The brain and posterior fossa are better assessed on the separately dictated brain MRI. The vertebral artery flow voids are present, better assessed on the same day CT neck. The paraspinal soft tissues are unremarkable. There are multiple thyroid nodules measuring up to 7 mm on the right. Not clinically significant; no follow-up imaging recommended (ref: J Am Coll Radiol. 2015 Feb;12(2): 143-50). Disc levels: There is marked multilevel disc desiccation and narrowing, most advanced at C5-C6 and C6-C7. There is mild diffuse congenital narrowing of the cervical spine with superimposed degenerative  changes below. Craniocervical junction: There is degenerative pannus about the dens. There is anterior subluxation of the C1 ring relative to C2 with the posterior arch anteriorly subluxed towards the canal. This results in moderate to severe stenosis of the canal at the level of the dens with mass effect on the cervicomedullary junction. This appears slightly worsened compared to the brain MRI of 11/21/2015. No definite cord signal abnormality is identified. C2-C3: There is a broad-based posterior disc osteophyte complex, ligamentum flavum thickening, and uncovertebral and facet arthropathy resulting in mild spinal canal stenosis and mild bilateral neural foraminal stenosis. C3-C4: There is a prominent posterior disc osteophyte complex, ligamentum flavum thickening, and uncovertebral and facet arthropathy resulting in mild spinal canal stenosis with partial effacement of the ventral thecal sac and severe right and moderate left neural foraminal stenosis. C4-C5: There is a posterior disc osteophyte complex, ligamentum flavum thickening, and uncovertebral and facet arthropathy resulting in mild spinal canal stenosis with partial effacement of the ventral thecal sac and severe bilateral neural foraminal stenosis. C5-C6: There is a posterior disc osteophyte complex with a prominent right paracentral/foraminal protrusion, ligamentum flavum thickening, and uncovertebral and facet arthropathy resulting in mild-to-moderate spinal canal stenosis with mild mass effect on the cord (worse in the right aspect of the canal) and severe bilateral neural foraminal stenosis. C6-C7: There is a posterior disc osteophyte complex, ligamentum flavum thickening, and uncovertebral and facet arthropathy resulting in mild spinal canal stenosis and severe bilateral neural foraminal stenosis C7-T1: No high-grade spinal canal or neural foraminal stenosis. IMPRESSION: 1. Anterior translation of the C1 ring relative to C2 with the posterior ring  anteriorly subluxed into the canal. This results in moderate to severe canal stenosis at the level of the dens with mass effect on the cord. No definite cord signal abnormality identified. The stenosis appears slightly worsened compared to the brain MRI and CT a neck from 11/21/2015. 2. Advanced multilevel degenerative changes throughout the remainder of the cervical spine detailed above resulting in mild-to-moderate spinal canal stenosis at C5-C6 and severe neural foraminal stenosis at numerous levels. Electronically Signed   By: Valetta Mole M.D.   On: 02/26/2021 17:40   DG Chest Port 1 View  Result Date: 02/27/2021 CLINICAL DATA:  87 year old female with shortness of breath. EXAM: PORTABLE CHEST 1 VIEW COMPARISON:  Chest radiographs 11/29/2020 and earlier. FINDINGS: Portable AP semi upright view at 1010 hours. Chronic blunting of the left costophrenic angle appears related to persistent pleural effusion seen by CTA in March. Stable cardiac size and mediastinal contours. Calcified aortic atherosclerosis. Visualized tracheal air column is within normal limits. No pneumothorax, pulmonary edema or consolidation. Upper lobe predominant emphysema and mild lung base scarring are again evident. No acute osseous abnormality identified. IMPRESSION: 1. Chronic small left pleural effusion. No acute cardiopulmonary abnormality. 2. Aortic Atherosclerosis (ICD10-I70.0) and Emphysema (ICD10-J43.9). Electronically Signed   By: Genevie Ann M.D.   On: 02/27/2021 10:43   EEG adult  Result Date: 02/27/2021 Lora Havens, MD     02/27/2021  4:33 PM Patient Name: NAKAILA COLEY MRN: [id number] Epilepsy Attending: Lora Havens Referring Physician/Provider: Dr Orma Flaming Date: 02/27/2021 Duration: 23.37 mins Patient history: 87 year old female with an episode of syncope.  EEG to evaluate for seizures. Level of alertness: Awake AEDs during EEG study: None Technical aspects: This EEG study was done with scalp electrodes  positioned according to the 10-20 International system of electrode placement. Electrical activity was acquired at a sampling rate of '500Hz'$  and reviewed with a high frequency filter of '70Hz'$  and a low frequency filter of '1Hz'$ . EEG data were recorded continuously and digitally stored. Description: The posterior dominant rhythm consists of 8.'5Hz'$  activity of moderate voltage (25-35 uV) seen predominantly in posterior head regions, symmetric and reactive to eye opening and eye closing. Hyperventilation and photic stimulation were not performed.   IMPRESSION: This study is within normal limits. No seizures or epileptiform discharges were seen throughout the recording. Lora Havens   CT HEAD CODE STROKE WO CONTRAST  Result Date: 02/26/2021 CLINICAL DATA:  Code stroke.  Abnormal sensation right hand EXAM: CT HEAD WITHOUT CONTRAST TECHNIQUE: Contiguous axial images were obtained from the base of the skull through the vertex without intravenous contrast. COMPARISON:  12/23/2020 FINDINGS: Brain: No acute intracranial hemorrhage, mass effect, or edema. No new loss of gray-white differentiation. Ventricles are stable in size. No extra-axial collection. Vascular: There is intracranial atherosclerotic calcification at the skull base. No hyperdense vessel. Soft tissue along left carotid reflecting known aneurysm. Skull: No new finding. Sinuses/Orbits: No acute abnormality. Other: Persistent patchy right mastoid opacification. ASPECTS ([place] Stroke Program Early CT Score) - Ganglionic level infarction (caudate, lentiform nuclei, internal capsule, insula, M1-M3 cortex): 7 - Supraganglionic infarction (M4-M6 cortex): 3 Total score (0-10 with 10 being normal): 10 IMPRESSION: There is no acute intracranial hemorrhage or evidence of acute infarction. ASPECT score is 10. These results were communicated to Dr. Curly Shores at 1:54 pm on 02/26/2021 by text page via the [facility] messaging system. Electronically Signed   By: Macy Mis  M.D.   On: 02/26/2021 13:56   CT ANGIO HEAD NECK W WO CM (CODE STROKE)  Result Date: 02/26/2021 CLINICAL DATA:  Code stroke.  Abnormal sensation in the right hand. EXAM: CT ANGIOGRAPHY HEAD AND NECK TECHNIQUE: Multidetector CT imaging of the head and neck was performed using the standard protocol during bolus administration of intravenous contrast. Multiplanar CT image reconstructions and MIPs were obtained to evaluate the vascular anatomy. Carotid stenosis measurements (when applicable) are obtained utilizing NASCET criteria, using the distal internal carotid diameter as the denominator. CONTRAST:  82m OMNIPAQUE IOHEXOL 350 MG/ML SOLN COMPARISON:  CT head without contrast 02/26/2021. MR head without contrast 12/25/2020. CTA of the head and neck 12/23/2020. FINDINGS: CTA NECK FINDINGS Aortic arch: Marked atherosclerotic disease is present in  the aorta. Significant irregular noncalcified plaque is present in the distal arch and proximal descending thoracic aorta without significant change. Atherosclerotic calcifications are present at the origins the great vessels without significant stenosis. No aneurysm is present. Right carotid system: Mural calcifications are again noted within the distal right common carotid artery without significant stenosis. Additional calcifications are present in the proximal right ICA without significant stenosis or change. Left carotid system: Atherosclerotic changes are noted in the walls of the distal left common carotid artery. Dilated carotid bulb consistent with previous endarterectomy noted. No residual recurrent stenosis is present. Cervical left ICA is otherwise normal, larger than on the right. Vertebral arteries: The right vertebral artery is the dominant vessel. Atherosclerotic changes are present at the origins of both vertebral arteries without significant stenosis. No significant stenosis is present in either vertebral artery in the neck. Skeleton: Stable multilevel  degenerative changes are present. No acute osseous abnormality is present. Other neck: Soft tissues the neck are otherwise unremarkable. Salivary glands are within normal limits. Thyroid is normal. No significant adenopathy is present. No focal mucosal or submucosal lesions are present. Upper chest: Mild centrilobular emphysematous changes are present. A left pleural effusion has increased since the prior study. Scattered ground-glass attenuation is present. No focal nodule or mass lesion is present. Review of the MIP images confirms the above findings CTA HEAD FINDINGS Anterior circulation: The left cavernous ICA aneurysm is stable, measuring 1.7 x 1.4 cm on axial images. There is a wide necked of this aneurysm. Left ICA continues to be of larger caliber than on the right. No significant intracranial stenosis or large vessel occlusion is present. The MCA bifurcations are intact. ACA and MCA branch vessels are unremarkable. Posterior circulation: The right vertebral artery is dominant. Minimal calcifications are present in the V4 segment without significant stenosis. The hypoplastic left vertebral artery bifurcates at the PICA. Basilar artery is normal. Both posterior cerebral arteries originate from basilar tip. Prominent right posterior communicating artery contributes. The PCA branch vessels are within normal limits bilaterally. Venous sinuses: The dural sinuses are patent. The straight sinus deep cerebral veins are intact. Cortical veins are within normal limits. No significant vascular malformation is evident. Anatomic variants: None Review of the MIP images confirms the above findings IMPRESSION: 1. No emergent large vessel occlusion. 2. Stable 1.7 x 1.4 cm left cavernous ICA aneurysm. 3. No significant proximal stenosis, new aneurysm, or branch vessel occlusion within the Circle of Willis. 4. Prior left carotid endarterectomy without residual or recurrent stenosis. 5. Extensive atherosclerotic changes in the  carotid arteries bilaterally without significant stenosis in the neck. 6. Aortic Atherosclerosis (ICD10-I70.0) and Emphysema (ICD10-J43.9). Electronically Signed   By: San Morelle M.D.   On: 02/26/2021 14:14     Patient Profile     87 y.o. female with multiple medical problems who presents with syncope  Assessment & Plan    1.  Syncope: Review of telemetry shows no significant arrhythmia.  Discussed possible etiologies of syncope with the patient and her daughter.  I think the most likely event is a hypotension related syncopal episode versus neurocardiogenic syncope.  She took her antihypertensive medicines, ate a meal, and then had syncope about 30 minutes later.  We talked about dividing her medications and we will move amlodipine and isosorbide to bedtime dosing.  The patient is in agreement with this.  She has not had any significant arrhythmia on review of her telemetry. 2.  Moderate aortic stenosis: Recommend repeat echocardiogram in 6 months.  Most recent echocardiogram reviewed. 3.  Recurrent stroke in patient with extensive vascular disease.  Continue medical management. 4.  Chronic combined systolic and diastolic heart failure, clinically euvolemic. 5.  Chronic kidney disease stage IIIb 6.  Hypertension: Continue amlodipine, hydralazine, and isosorbide.  Divide dosing schedule as above.  Blood pressure reasonably controlled.  Would err on the side of permissive hypertension as she is quite frail now with recurrent syncope.    [facility] will sign off.   Medication Recommendations: See above Other recommendations (labs, testing, etc): None Follow up as an outpatient:  Scheduled to see Dr. Oval Linsey 10/17  For questions or updates, please contact [facility] Please consult [email or web address] for contact info under        Signed, Sherren Mocha, MD  02/28/2021, 9:49 AM

## 2021-02-28 NOTE — Evaluation (Signed)
Physical Therapy Evaluation Patient Details Name: [name] MRN: [id number] DOB: [birth year] Today's Date: 02/28/2021   History of Present Illness  87 y.o. female presented to ED via EMS on 02/28/2021 after period of unresponsiveness  and onset of stroke like symptoms of right-sided weakness and double vision.  Neurology was consulted and w/u was negative for acute stroke.  Admitted for observation for treatment of syncope.  PMH significant for prior CVA  6/22 and 2015, former tobacco user COPD, CKD 3B, chronic anxiety, hypertension, hyperlipidemia, hypothyroidism, GERD,  Clinical Impression  PTA pt living alone in multilevel home with ramped entrance and chair lifts between levels. Pt reports ambulation with RW at home and use of transport chair with community mobility. Pt requires assist for bathing and dressing, and family provides for iADLs, had Aide that helped with bathing and dressing but quit and private service has not been able to replace. Pt is currently limited in safe mobility by generalized weakness and mild instability, however reports she is at her baseline level of function. Pt is mod I for bed mobility and supervision for transfers and ambulation with RW. Pt will need 24 hr supervision at discharge from a safety standpoint given her episodes of syncope. Pt does not need physical assist. PT recommends pt continue HHPT that she is already taking part in from prior hospitalization. PT will continue to follow acutely.     Follow Up Recommendations Home health PT;Supervision/Assistance - 24 hour    Equipment Recommendations  None recommended by PT    Recommendations for Other Services OT consult     Precautions / Restrictions Precautions Precautions: Fall Restrictions Weight Bearing Restrictions: No      Mobility  Bed Mobility Overal bed mobility: Modified Independent             General bed mobility comments: HoB elevated, increased time and effort     Transfers Overall transfer level: Needs assistance Equipment used: Rolling walker (2 wheeled) Transfers: Sit to/from Stand Sit to Stand: Supervision         General transfer comment: supervision for safety, vc for safe hand placement  Ambulation/Gait Ambulation/Gait assistance: Supervision Gait Distance (Feet): 25 Feet Assistive device: Rolling walker (2 wheeled) Gait Pattern/deviations: Step-through pattern;Decreased step length - right;Decreased step length - left;Trunk flexed Gait velocity: WFL Gait velocity interpretation: <1.8 ft/sec, indicate of risk for recurrent falls General Gait Details: supervision for safety, with slowed, steady gait         Balance Overall balance assessment: Mild deficits observed, not formally tested                                           Pertinent Vitals/Pain Pain Assessment: No/denies pain    Home Living Family/patient expects to be discharged to:: Private residence Living Arrangements: Alone Available Help at Discharge: Family;Personal care attendant;Available PRN/intermittently Type of Home: House Home Access: Ramped entrance     Home Layout: Multi-level Home Equipment: Shower seat;Walker - 2 wheels;Transport chair;Cane - single point;Grab bars - tub/shower;Grab bars - toilet;Other (comment) (chair lifts and RW on all levels) Additional Comments: use to have Aide but 2x/week for ~30 minutes to assist with shower; family/housekeeper does cooking/cleaning    Prior Function Level of Independence: Needs assistance   Gait / Transfers Assistance Needed: Very short distance ambulation with RW. Uses transport chair when out of home.  ADL's / [employer]  Needed: Assistance for bathing and dressing  Comments: iADLs family provides     Hand Dominance   Dominant Hand: Right    Extremity/Trunk Assessment   Upper Extremity Assessment Upper Extremity Assessment: Defer to OT evaluation    Lower  Extremity Assessment Lower Extremity Assessment: RLE deficits/detail;LLE deficits/detail RLE Deficits / Details: ROM WFL, strength grossly 4/5 RLE Sensation: WNL RLE Coordination: decreased fine motor LLE Deficits / Details: ROM WFL, strength grossly 4/5 LLE Sensation: WNL LLE Coordination: decreased fine motor       Communication   Communication: HOH  Cognition Arousal/Alertness: Awake/alert Behavior During Therapy: WFL for tasks assessed/performed Overall Cognitive Status: Within Functional Limits for tasks assessed                                        General Comments General comments (skin integrity, edema, etc.): Daughter present during session, Orthostatic vitals taken and charted in vital signs, SaO2 on RA >95%O2 throughout session.    Exercises     Assessment/Plan    PT Assessment Patient needs continued PT services  PT Problem List Cardiopulmonary status limiting activity;Decreased activity tolerance       PT Treatment Interventions DME instruction;Gait training;Functional mobility training;Therapeutic activities;Therapeutic exercise;Balance training;Cognitive remediation;Patient/family education    PT Goals (Current goals can be found in the Care Plan section)  Acute Rehab PT Goals Patient Stated Goal: go home PT Goal Formulation: With patient/family Time For Goal Achievement: 03/14/21 Potential to Achieve Goals: Good    Frequency Min 3X/week    AM-PAC PT "6 Clicks" Mobility  Outcome Measure Help needed turning from your back to your side while in a flat bed without using bedrails?: None Help needed moving from lying on your back to sitting on the side of a flat bed without using bedrails?: None Help needed moving to and from a bed to a chair (including a wheelchair)?: None Help needed standing up from a chair using your arms (e.g., wheelchair or bedside chair)?: None Help needed to walk in hospital room?: None Help needed climbing 3-5  steps with a railing? : A Little 6 Click Score: 23    End of Session   Activity Tolerance: Patient tolerated treatment well Patient left: in chair;with call bell/phone within reach;with chair alarm set;with family/visitor present Nurse Communication: Mobility status;Other (comment) (charting of orthostatic BP measurements) PT Visit Diagnosis: Muscle weakness (generalized) (M62.81);History of falling (Z91.81)    Time: [id number] PT Time Calculation (min) (ACUTE ONLY): 36 min   Charges:   PT Evaluation $PT Eval Moderate Complexity: 1 Mod PT Treatments $Therapeutic Activity: 8-22 mins        Reily Treloar B. Migdalia Dk PT, DPT Acute Rehabilitation Services Pager [phone] Office [phone]   [place] 02/28/2021, 12:20 PM

## 2021-03-05 NOTE — Telephone Encounter (Signed)
FYI

## 2021-03-06 ENCOUNTER — Telehealth: Payer: Self-pay

## 2021-03-06 ENCOUNTER — Encounter: Payer: Self-pay | Admitting: Adult Health

## 2021-03-06 ENCOUNTER — Other Ambulatory Visit: Payer: Self-pay

## 2021-03-06 ENCOUNTER — Ambulatory Visit: Payer: Medicare PPO | Admitting: Adult Health

## 2021-03-06 VITALS — BP 130/60 | HR 82 | Temp 98.0°F | Ht 66.0 in | Wt 130.0 lb

## 2021-03-06 DIAGNOSIS — R531 Weakness: Secondary | ICD-10-CM

## 2021-03-06 DIAGNOSIS — I671 Cerebral aneurysm, nonruptured: Secondary | ICD-10-CM

## 2021-03-06 DIAGNOSIS — I5042 Chronic combined systolic (congestive) and diastolic (congestive) heart failure: Secondary | ICD-10-CM

## 2021-03-06 DIAGNOSIS — R55 Syncope and collapse: Secondary | ICD-10-CM | POA: Diagnosis not present

## 2021-03-06 DIAGNOSIS — E039 Hypothyroidism, unspecified: Secondary | ICD-10-CM

## 2021-03-06 DIAGNOSIS — N1831 Chronic kidney disease, stage 3a: Secondary | ICD-10-CM

## 2021-03-06 DIAGNOSIS — M4802 Spinal stenosis, cervical region: Secondary | ICD-10-CM

## 2021-03-06 DIAGNOSIS — I350 Nonrheumatic aortic (valve) stenosis: Secondary | ICD-10-CM | POA: Diagnosis not present

## 2021-03-06 DIAGNOSIS — I10 Essential (primary) hypertension: Secondary | ICD-10-CM | POA: Diagnosis not present

## 2021-03-06 DIAGNOSIS — J439 Emphysema, unspecified: Secondary | ICD-10-CM | POA: Diagnosis not present

## 2021-03-06 NOTE — Telephone Encounter (Signed)
Left message to return phone call.

## 2021-03-06 NOTE — Telephone Encounter (Signed)
Verbal orders given to Cara. 

## 2021-03-06 NOTE — Telephone Encounter (Signed)
Okay for verbal orders? Please advise 

## 2021-03-06 NOTE — Telephone Encounter (Signed)
Cera from [facility] called requesting verbal orders to resume care call back [phone]

## 2021-03-06 NOTE — Progress Notes (Signed)
Subjective:    Patient ID: [name], female    DOB: 03-13-1925, 87 y.o.   MRN: [id number]  HPI 87 year old female who  has a past medical history of Anxiety, Aortic arch atherosclerosis ([place]) (06/24/2014), Atherosclerotic ulcer of aorta ([place]) (06/24/2014), Carotid artery occlusion, Chronic kidney disease, COPD (chronic obstructive pulmonary disease) ([address]), DISC DISEASE, LUMBAR (12/16/2008), DIVERTICULOSIS, COLON (09/30/2008), DYSPNEA (07/13/2008), Graves disease, History of embolic stroke ([id number]), HYPERLIPIDEMIA (03/06/2007), HYPERTENSION (03/06/2007), HYPOTHYROIDISM (10/13/2007), OSTEOARTHRITIS (03/06/2007), Personal history of colonic polyps (09/30/2008), Stroke ([place]), TOBACCO USE, QUIT (04/12/2009), and WEAKNESS (11/09/2007).  She presents to the office today for TCM visit   Admit Date 02/27/2021 Discharge Date 02/28/2021  Presented to the emergency room for possible syncopal event on the morning of 02/27/2021.  She woke up around 7:30 AM on the day of admission and felt as though she had a hard time breathing) but states a panic attack.  She took a Xanax and 1 downstairs.  When she was at the breakfast table she was doing better but then became hot told her daughter that she did not feel good.  She done dropped the paper, her head went back and over the chair, eyes rolled back and she was staring straight up and then stop breathing.  Her daughter called 911 and was completely unresponsive at this time.  When her daughter tried to move her to the floor, the patient woke up and said "no".  EMS arrived and put her on oxygen.  When EMS arrived she was a little out of it and had some mild confusion.  She was able to answer questions appropriately.  Her daughter reported that she was unresponsive for about 4 minutes.  There was no shaking, loss of urine, tongue biting during this episode.  She denied headaches afterwards  She was seen the day prior to admission in the ER for strokelike symptoms with  right-sided arm weakness and double vision.  Neurology was consulted and her work-up was negative for acute stroke.  She improved upon discharge.  It was not actually clear what caused her fainting spell, cardiology suspected it was hypotension versus neurocardiogenic, vagally mediated.  Hospital Course  Syncope  -Orthostatics  - She was not interested in consultation with cardiology during this admission -Medications adjusted.  Imdur and amlodipine were switched to nightly dosing -EEG negative -Echo in June showed moderate aortic valve stenosis.  Recommend repeating echo in 6 months -Outpatient follow-up with PCP, neurology, cardiology  Essential Hypertension  -Continue home medication  Bilateral Carotid Artery Disease -Status post left CEA in May 2017 -Follow-up with outpatient vascular surgery as needed  History of embolic stroke in 123456 and 2022 -Continue aspirin and atorvastatin daily -She did complete a 3-week course of aspirin and Plavix after her stroke in June -Not interested in follow-up with neurology at this time  COPD with emphysema  -No signs of any COPD exacerbation -Continue with daily prednisone -To new Trelegy and as needed DuoNebs and albuterol rescue inhaler  CKD, stage III - Follow up outpatient   Chronic combined systolic and diastolic CHF.  Moderate aortic valve stenosis\ -Last echo in June 2022 showed EF of 40 to 45% with mildly decreased left ventricular function.  Grade 1 diastolic dysfunction.  She also has moderate aortic valve stenosis -No signs of fluid overload or CHF exacerbation during this hospital admission Follow-up with cardiology outpatient  Left ICA aneurysm -Followed up with neurology-not likely candidate for aggressive intervention per neurology  Cervical spine  stenosis -Neurosurgery discussed with EDP and not a candidate for any intervention -Continue with conservative therapy and may benefit from some PT  Hypothyroidism  -  Continue home dose of synthroid   Mixed Hyperlipidemia  - COntinue atorvastatin   Weakness - PT/OT   Today in the office she is with her daughter.  Reports that she feels "back to normal".  She has not had any dizziness, lightheadedness, or syncopal episodes since being discharged from the hospital.  Her daughter is going over to her house every morning to make sure that she is getting out of bed, eating breakfast, and getting ready for the day then she leaves.  The plan to have a home health aide come into the home during the morning hours 3 to 4 days a week just to check in on her.  The patient is interested in having some home health PT for strengthening exercises.  She has no acute complaints today   Review of Systems  Constitutional:  Positive for activity change and fatigue.  HENT: Negative.    Respiratory:  Positive for shortness of breath (chronic).   Cardiovascular: Negative.   Gastrointestinal: Negative.   Musculoskeletal:  Positive for arthralgias, back pain and gait problem.  Neurological:  Positive for weakness.  Hematological: Negative.   Psychiatric/Behavioral:  The patient is nervous/anxious (chronic).   All other systems reviewed and are negative. Past Medical History:  Diagnosis Date   Anxiety    Aortic arch atherosclerosis ([place]) 06/24/2014   Atherosclerotic ulcer of aorta ([place]) 06/24/2014   Carotid artery occlusion    Chronic kidney disease    COPD (chronic obstructive pulmonary disease) ([place])    DISC DISEASE, LUMBAR 12/16/2008   DIVERTICULOSIS, COLON 09/30/2008   DYSPNEA 07/13/2008   Graves disease    History of embolic stroke [id number]   Left brain   HYPERLIPIDEMIA 03/06/2007   HYPERTENSION 03/06/2007   HYPOTHYROIDISM 10/13/2007   OSTEOARTHRITIS 03/06/2007   Personal history of colonic polyps 09/30/2008   Stroke ([place])    06/2014            TOBACCO USE, QUIT 04/12/2009   WEAKNESS 11/09/2007    Social History   Socioeconomic History   Marital status:  Divorced    Spouse name: Not on file   Number of children: 4   Years of education: college   Highest education level: Not on file  Occupational History   Occupation: retired  Tobacco Use   Smoking status: Former    Packs/day: 1.50    Years: 29.00    Pack years: 43.50    Types: Cigarettes    Start date: 07/08/1949    Quit date: 07/08/1978    Years since quitting: 42.6   Smokeless tobacco: Never  Vaping Use   Vaping Use: Never used  Substance and Sexual Activity   Alcohol use: Yes    Alcohol/week: 1.0 standard drink    Types: 1 Glasses of wine per week    Comment: "red wine"- some nights   Drug use: No   Sexual activity: Not Currently  Other Topics Concern   Not on file  Social History Narrative      Patient is right handed.   Patient drinks 1 cup caffeine daily.   Social Determinants of Health   Financial Resource Strain: Not on file  Food Insecurity: Not on file  Transportation Needs: Not on file  Physical Activity: Not on file  Stress: Not on file  Social Connections: Not on file  Intimate Partner  Violence: Not on file    Past Surgical History:  Procedure Laterality Date   CARDIAC CATHETERIZATION     CATARACT EXTRACTION Bilateral    CHOLECYSTECTOMY     ENDARTERECTOMY Left 11/13/2015   Procedure: LEFT CAROTID ARTERY ENDARTERECTOMY;  Surgeon: Conrad Lumpkin, MD;  Location: [facility];  Service: Vascular;  Laterality: Left;   ESOPHAGOGASTRODUODENOSCOPY (EGD) WITH PROPOFOL N/A 10/11/2016   Procedure: ESOPHAGOGASTRODUODENOSCOPY (EGD) WITH PROPOFOL;  Surgeon: Mauri Pole, MD;  Location: [facility];  Service: Endoscopy;  Laterality: N/A;   KNEE SURGERY     PATCH ANGIOPLASTY Left 11/13/2015   Procedure: WITH 1CM X 6CM  XENOSURE BIOLOGIC PATCH ANGIOPLASTY;  Surgeon: Conrad [place], MD;  Location: [facility];  Service: Vascular;  Laterality: Left;   TEE WITHOUT CARDIOVERSION N/A 06/24/2014   Procedure: TRANSESOPHAGEAL ECHOCARDIOGRAM (TEE);  Surgeon: Sanda Klein, MD;  Location: [facility]  ENDOSCOPY;  Service: Cardiovascular;  Laterality: N/A;    Family History  Problem Relation Age of Onset   Stomach cancer Maternal Grandmother    Colon cancer Neg Hx     Allergies  Allergen Reactions   Catapres [Clonidine Hcl] Other (See Comments)    Made pt feel horrible, shaky, weak and nausea   Clonidine Other (See Comments)    Made pt feel horrible, shaky, weak and nausea   Lisinopril Anaphylaxis    Tongue swelling   Labetalol Other (See Comments)    Caused bradycardia and syncope   Labetalol Hcl Other (See Comments)    Caused bradycardia and syncope    Current Outpatient Medications on File Prior to Visit  Medication Sig Dispense Refill   albuterol (PROAIR HFA) 108 (90 Base) MCG/ACT inhaler Inhale 2 puffs into the lungs every 6 (six) hours as needed for wheezing or shortness of breath. 8.5 Inhaler 5   ALPRAZolam (XANAX) 0.5 MG tablet Take 1 tablet (0.5 mg total) by mouth 2 (two) times daily. (Patient taking differently: Take 0.5 mg by mouth 3 (three) times daily.) 60 tablet 2   amLODipine (NORVASC) 5 MG tablet Take 1 tablet (5 mg total) by mouth at bedtime. 90 tablet 3   aspirin EC 81 MG EC tablet Take 1 tablet (81 mg total) by mouth daily. Swallow whole. (Patient taking differently: Take 81 mg by mouth at bedtime. Swallow whole.)     atorvastatin (LIPITOR) 80 MG tablet TAKE 1 TABLET BY MOUTH EVERY DAY AT 6PM (Patient taking differently: Take 80 mg by mouth at bedtime. TAKE 1 TABLET BY MOUTH EVERY DAY AT 6PM) 90 tablet 3   Cyanocobalamin (B-12 PO) Take 1 tablet by mouth daily.     Fluticasone-Umeclidin-Vilant (TRELEGY ELLIPTA) 100-62.5-25 MCG/INH AEPB INHALE 1 PUFF BY MOUTH EVERY DAY (Patient taking differently: Inhale 1 puff into the lungs daily.) 60 each 3   furosemide (LASIX) 20 MG tablet Take 1 tablet (20 mg total) by mouth daily. 90 tablet 3   hydrALAZINE (APRESOLINE) 50 MG tablet Take 1 tablet (50 mg total) by mouth 3 (three) times daily. 270 tablet 3    ipratropium-albuterol (DUONEB) 0.5-2.5 (3) MG/3ML SOLN Take 3 mLs by nebulization every 6 (six) hours as needed (copd). 360 mL 11   isosorbide mononitrate (IMDUR) 30 MG 24 hr tablet Take 1 tablet (30 mg total) by mouth at bedtime. 90 tablet 3   latanoprost (XALATAN) 0.005 % ophthalmic solution Place 1 drop into both eyes at bedtime.      levothyroxine (SYNTHROID) 75 MCG tablet Take 1 tablet (75 mcg total) by mouth daily before  breakfast. 90 tablet 3   pantoprazole (PROTONIX) 40 MG tablet TAKE 1 TABLET BY MOUTH EVERY DAY (Patient taking differently: Take 40 mg by mouth daily.) 30 tablet 2   potassium chloride SA (KLOR-CON) 20 MEQ tablet Take 1 tablet (20 mEq total) by mouth daily. 90 tablet 3   predniSONE (DELTASONE) 10 MG tablet Take 1 tablet (10 mg total) by mouth daily with breakfast. 90 tablet 1   No current facility-administered medications on file prior to visit.    BP 130/60   Pulse 82   Temp 98 F (36.7 C) (Oral)   Ht '5\' 6"'$  (1.676 m)   Wt 130 lb (59 kg)   SpO2 94%   BMI 20.98 kg/m       Objective:   Physical Exam Vitals and nursing note reviewed.  Constitutional:      Appearance: Normal appearance.  HENT:     Head: Normocephalic and atraumatic.     Right Ear: Tympanic membrane, ear canal and external ear normal. There is no impacted cerumen.     Left Ear: Tympanic membrane, ear canal and external ear normal. There is no impacted cerumen.     Nose: Nose normal.     Mouth/Throat:     Mouth: Mucous membranes are dry.     Pharynx: Oropharynx is clear.  Eyes:     Extraocular Movements: Extraocular movements intact.     Pupils: Pupils are equal, round, and reactive to light.  Cardiovascular:     Rate and Rhythm: Normal rate and regular rhythm.     Pulses: Normal pulses.     Heart sounds: Normal heart sounds.  Pulmonary:     Effort: Pulmonary effort is normal.     Breath sounds: Normal breath sounds.  Skin:    General: Skin is warm and dry.  Neurological:     General:  No focal deficit present.     Mental Status: She is alert and oriented to person, place, and time.     Motor: Weakness present.     Comments: In wheelchair for exam    Psychiatric:        Mood and Affect: Mood normal.        Behavior: Behavior normal.        Thought Content: Thought content normal.        Judgment: Judgment normal.      Assessment & Plan:  1. Chronic combined systolic and diastolic CHF (congestive heart failure) ([place]) -Reviewed hospital notes, discharge stress instructions, labs, and imaging.  All questions answered to the best of my ability.  2. Cervical spinal stenosis - Ambulatory referral to Home Health  3. Mild aortic stenosis - Follow up with cardiology as directed  4. Syncope, unspecified syncope type - No recurrent symptoms  - Continue to monitor  -   5. Cerebral aneurysm - Continue to monitor  - Not a surgical canidate   6. Pulmonary emphysema, unspecified emphysema type ([place]) - Continue with plan of care by pulmonary   7. Weakness  - Ambulatory referral to Home Health  8. Essential hypertension - Well controlled.  - Continue current medications   9. Hypothyroidism, unspecified type - Continue current synthroid dose   10. Stage 3a chronic kidney disease ([place]) - Continue to monitor   [name], NP

## 2021-03-08 DIAGNOSIS — I447 Left bundle-branch block, unspecified: Secondary | ICD-10-CM | POA: Diagnosis not present

## 2021-03-08 DIAGNOSIS — I69351 Hemiplegia and hemiparesis following cerebral infarction affecting right dominant side: Secondary | ICD-10-CM | POA: Diagnosis not present

## 2021-03-08 DIAGNOSIS — N183 Chronic kidney disease, stage 3 unspecified: Secondary | ICD-10-CM | POA: Diagnosis not present

## 2021-03-08 DIAGNOSIS — D631 Anemia in chronic kidney disease: Secondary | ICD-10-CM | POA: Diagnosis not present

## 2021-03-08 DIAGNOSIS — I700 Atherosclerosis of aorta: Secondary | ICD-10-CM | POA: Diagnosis not present

## 2021-03-08 DIAGNOSIS — I5021 Acute systolic (congestive) heart failure: Secondary | ICD-10-CM | POA: Diagnosis not present

## 2021-03-08 DIAGNOSIS — I671 Cerebral aneurysm, nonruptured: Secondary | ICD-10-CM | POA: Diagnosis not present

## 2021-03-08 DIAGNOSIS — I5022 Chronic systolic (congestive) heart failure: Secondary | ICD-10-CM | POA: Diagnosis not present

## 2021-03-08 DIAGNOSIS — I130 Hypertensive heart and chronic kidney disease with heart failure and stage 1 through stage 4 chronic kidney disease, or unspecified chronic kidney disease: Secondary | ICD-10-CM | POA: Diagnosis not present

## 2021-03-09 DIAGNOSIS — I671 Cerebral aneurysm, nonruptured: Secondary | ICD-10-CM | POA: Diagnosis not present

## 2021-03-09 DIAGNOSIS — I130 Hypertensive heart and chronic kidney disease with heart failure and stage 1 through stage 4 chronic kidney disease, or unspecified chronic kidney disease: Secondary | ICD-10-CM | POA: Diagnosis not present

## 2021-03-09 DIAGNOSIS — D631 Anemia in chronic kidney disease: Secondary | ICD-10-CM | POA: Diagnosis not present

## 2021-03-09 DIAGNOSIS — I447 Left bundle-branch block, unspecified: Secondary | ICD-10-CM | POA: Diagnosis not present

## 2021-03-09 DIAGNOSIS — I700 Atherosclerosis of aorta: Secondary | ICD-10-CM | POA: Diagnosis not present

## 2021-03-09 DIAGNOSIS — I5022 Chronic systolic (congestive) heart failure: Secondary | ICD-10-CM | POA: Diagnosis not present

## 2021-03-09 DIAGNOSIS — I69351 Hemiplegia and hemiparesis following cerebral infarction affecting right dominant side: Secondary | ICD-10-CM | POA: Diagnosis not present

## 2021-03-09 DIAGNOSIS — I5021 Acute systolic (congestive) heart failure: Secondary | ICD-10-CM | POA: Diagnosis not present

## 2021-03-09 DIAGNOSIS — N183 Chronic kidney disease, stage 3 unspecified: Secondary | ICD-10-CM | POA: Diagnosis not present

## 2021-03-13 DIAGNOSIS — H353113 Nonexudative age-related macular degeneration, right eye, advanced atrophic without subfoveal involvement: Secondary | ICD-10-CM | POA: Diagnosis not present

## 2021-03-13 DIAGNOSIS — H353221 Exudative age-related macular degeneration, left eye, with active choroidal neovascularization: Secondary | ICD-10-CM | POA: Diagnosis not present

## 2021-03-14 ENCOUNTER — Other Ambulatory Visit: Payer: Medicare PPO

## 2021-03-14 ENCOUNTER — Other Ambulatory Visit: Payer: Self-pay

## 2021-03-14 ENCOUNTER — Other Ambulatory Visit: Payer: Medicare PPO | Admitting: *Deleted

## 2021-03-14 VITALS — BP 152/73 | HR 68 | Temp 97.7°F | Resp 16

## 2021-03-14 DIAGNOSIS — Z515 Encounter for palliative care: Secondary | ICD-10-CM

## 2021-03-14 NOTE — Progress Notes (Signed)
[facility] COMMUNITY PALLIATIVE CARE RN NOTE  PATIENT NAME: [name] DOB: 05-11-1925 MRN: [id number]  PRIMARY CARE PROVIDER: [name], NP  RESPONSIBLE PARTY:  Acct ID - Guarantor Home Phone Work Phone Relationship Acct Type  [id number] AMAI, [phone]  Self P/F     [address], [place],  [id number]   Covid-19 Pre-screening Negative  PLAN OF CARE and INTERVENTION:  ADVANCE CARE PLANNING/GOALS OF CARE: Goal is for patient to remain in her home and maintain her independence. She has a DNR. PATIENT/CAREGIVER EDUCATION: Symptom management DISEASE STATUS: Joint follow-up visit made with LCSW, M. Lonon. Met with patient in her home. She was recently admitted to [facility] on 02/27/21 to 02/28/21 after having a syncope episode at home and was unresponsive. She was seen the day before in the ED for right sided weakness, but stroke work-up was negative. EEG was negative for seizures. Cardiology was consulted and her BP medication times were adjusted. She was then discharged back to home. Patient reports feeling "great.". Denies pain or shortness of breath. Denies dizziness, headaches or weakness. She continues to ambulate independently using her walker. She continues to go out to get her mani pedi's which she thoroughly enjoys. Her appetite is good. No dysphagia. No recent falls. She was seen by her PCP this week and no changes were made to her plan of care. She continues to wear her Life Alert pendant for safety. She is appreciative of visit.   HISTORY OF PRESENT ILLNESS: This is a 87 yo female with a history of CHF, CVA, mild aortic stenosis, COPD, hypothyroidism, chronic kidney disease Stage 3 and osteoarthritis. Palliative care team continues to follow patient for symptom management, goals of care and complex decision making.   CODE STATUS: DNR ADVANCED DIRECTIVES: Y MOST FORM: no PPS: 60%   PHYSICAL EXAM:   VITALS: Today's Vitals   03/14/21 0958  BP: (!)  152/73  Pulse: 68  Resp: 16  Temp: 97.7 F (36.5 C)  TempSrc: Temporal  SpO2: 95%  PainSc: 0-No pain    LUNGS: clear to auscultation  CARDIAC: Cor RRR EXTREMITIES: No edema SKIN:  Exposed skin is dry and intact   NEURO:  Alert and oriented x 4, pleasant mood, hard of hearing, ambulatory w/walker   (Duration of visit and documentation 45 minutes)   Daryl Eastern, RN BSN

## 2021-03-14 NOTE — Progress Notes (Signed)
COMMUNITY PALLIATIVE CARE SW NOTE  PATIENT NAME: [name] DOB: Jun 30, 1925 MRN: [id number]  PRIMARY CARE PROVIDER: [name], NP  RESPONSIBLE PARTY:  Acct ID - Guarantor Home Phone Work Phone Relationship Acct Type  [id number] [name]  Self P/F     [address], [place],  [address]     PLAN OF CARE and INTERVENTIONS:             GOALS OF CARE/ ADVANCE CARE PLANNING: Goal is for patient to remain in her home as independent as possible. Patient is a DNR. SOCIAL/EMOTIONAL/SPIRITUAL ASSESSMENT/ INTERVENTIONS:  SW and RN-M. Nadara Mustard completed a  follow-up palliative visit with patient at her home. Patient was sitting in her recliner the living-room,awake, alert and in a great mood, she was cordial, humorous and engaged. She denied pain. Patient reported that she was doing well despite a recent hospitalization. She is sleeping well. She ambulates independently and continues to go with friends and family. She reports that her appetite remains good. She also has a private caregiver 2x/week. Patient also has a life alert button that she wears around her neck. Patient verbalized no concerns. Her daughter lives across the street and is accessible for any needs/support daily. Patient remains open to ongoing palliative care visits/support. The team reinforced contact information and encouraged her to call with any questions or concerns.  PATIENT/CAREGIVER EDUCATION/ COPING:  Patient appears to be coping well.  PERSONAL EMERGENCY PLAN:  Patient has a life alert button and 911 can accessed for emergencies.  COMMUNITY RESOURCES COORDINATION/ HEALTH CARE NAVIGATION:  Patient has a private caregivers 2x week.  FINANCIAL/LEGAL CONCERNS/INTERVENTIONS:  None.     SOCIAL HX:  Social History   Tobacco Use   Smoking status: Former    Packs/day: 1.50    Years: 29.00    Pack years: 43.50    Types: Cigarettes    Start date: 07/08/1949    Quit date: 07/08/1978    Years since  quitting: 42.7   Smokeless tobacco: Never  Substance Use Topics   Alcohol use: Yes    Alcohol/week: 1.0 standard drink    Types: 1 Glasses of wine per week    Comment: "red wine"- some nights    CODE STATUS: DNR ADVANCED DIRECTIVES: Yes MOST FORM COMPLETE:  No HOSPICE EDUCATION PROVIDED: N  PPS: Patient is alert and oriented x3. Patient is independent for ADL's and ambulates with a walker.  Duration of visit and documentation: 60 minutes.    [address] [place], [place]

## 2021-03-15 ENCOUNTER — Telehealth: Payer: Self-pay | Admitting: Adult Health

## 2021-03-15 ENCOUNTER — Other Ambulatory Visit: Payer: Self-pay

## 2021-03-15 ENCOUNTER — Encounter (HOSPITAL_BASED_OUTPATIENT_CLINIC_OR_DEPARTMENT_OTHER): Payer: Self-pay | Admitting: Cardiovascular Disease

## 2021-03-15 ENCOUNTER — Ambulatory Visit (INDEPENDENT_AMBULATORY_CARE_PROVIDER_SITE_OTHER): Payer: Medicare PPO | Admitting: Cardiovascular Disease

## 2021-03-15 VITALS — BP 148/70 | HR 91 | Ht 66.0 in | Wt 130.4 lb

## 2021-03-15 DIAGNOSIS — I671 Cerebral aneurysm, nonruptured: Secondary | ICD-10-CM | POA: Diagnosis not present

## 2021-03-15 DIAGNOSIS — I700 Atherosclerosis of aorta: Secondary | ICD-10-CM

## 2021-03-15 DIAGNOSIS — I447 Left bundle-branch block, unspecified: Secondary | ICD-10-CM | POA: Diagnosis not present

## 2021-03-15 DIAGNOSIS — E782 Mixed hyperlipidemia: Secondary | ICD-10-CM

## 2021-03-15 DIAGNOSIS — K31811 Angiodysplasia of stomach and duodenum with bleeding: Secondary | ICD-10-CM

## 2021-03-15 DIAGNOSIS — I69351 Hemiplegia and hemiparesis following cerebral infarction affecting right dominant side: Secondary | ICD-10-CM | POA: Diagnosis not present

## 2021-03-15 DIAGNOSIS — I130 Hypertensive heart and chronic kidney disease with heart failure and stage 1 through stage 4 chronic kidney disease, or unspecified chronic kidney disease: Secondary | ICD-10-CM | POA: Diagnosis not present

## 2021-03-15 DIAGNOSIS — I5042 Chronic combined systolic (congestive) and diastolic (congestive) heart failure: Secondary | ICD-10-CM

## 2021-03-15 DIAGNOSIS — I639 Cerebral infarction, unspecified: Secondary | ICD-10-CM | POA: Diagnosis not present

## 2021-03-15 DIAGNOSIS — I5021 Acute systolic (congestive) heart failure: Secondary | ICD-10-CM | POA: Diagnosis not present

## 2021-03-15 DIAGNOSIS — I6523 Occlusion and stenosis of bilateral carotid arteries: Secondary | ICD-10-CM

## 2021-03-15 DIAGNOSIS — I10 Essential (primary) hypertension: Secondary | ICD-10-CM | POA: Diagnosis not present

## 2021-03-15 DIAGNOSIS — D631 Anemia in chronic kidney disease: Secondary | ICD-10-CM | POA: Diagnosis not present

## 2021-03-15 DIAGNOSIS — N183 Chronic kidney disease, stage 3 unspecified: Secondary | ICD-10-CM | POA: Diagnosis not present

## 2021-03-15 DIAGNOSIS — I350 Nonrheumatic aortic (valve) stenosis: Secondary | ICD-10-CM

## 2021-03-15 DIAGNOSIS — R55 Syncope and collapse: Secondary | ICD-10-CM | POA: Diagnosis not present

## 2021-03-15 DIAGNOSIS — I5022 Chronic systolic (congestive) heart failure: Secondary | ICD-10-CM | POA: Diagnosis not present

## 2021-03-15 NOTE — Assessment & Plan Note (Addendum)
LVEF 40-45%.  Ischemic work up is deferred.  She is euvolemic.  Continue lasix, hydralazine and Imdur.  She is not on a beta-blocker due to bradycardia and she is not on an ACE inhibitor or ARB due to anaphylaxis.

## 2021-03-15 NOTE — Assessment & Plan Note (Signed)
Mean gradient 15 mmHg on echo 12/2020.  Continue to monitor.

## 2021-03-15 NOTE — Assessment & Plan Note (Signed)
Lipids well-controlled on atorvastatin. 

## 2021-03-15 NOTE — Assessment & Plan Note (Signed)
She has a history of GI bleeding from AVMs.  There was consideration for her being on anticoagulation due to concern for atrial fibrillation.  However there is no clear evidence of any atrial fibrillation that has been seen on monitoring either in the hospital or on ambulatory monitors.  Therefore continue with just aspirin for now.  She seems to be tolerating this well with the addition of pantoprazole.

## 2021-03-15 NOTE — Progress Notes (Signed)
Cardiology Office Note   Date:  03/15/2021   ID:  MITTIE BOETTNER, DOB May 09, 1925, MRN [id number]  PCP:  [name], NP  Cardiologist:   Skeet Latch, MD   No chief complaint on file.    History of Present Illness: [name] is a 87 y.o. female with chronic systolic diastolic heart failure, hypertension, carotid stenosis status post left CEA, severe atherosclerosis of the aorta, moderate aortic stenosis, hypertension, hyperlipidemia, COPD, GI bleed 2/2, and CKD IIIB  who presents for follow up.  She was admitted 08/2020 with acute systolic massive heart failure.  LVEF was 40 to 45% down from 55% previously.  Given her history of GI bleeding and limited functional capacity, she and her family elected for conservative management.  Aspirin was added to her regimen.  She is not treated with beta-blockers due to bradycardia and syncope.  She previously had anaphylaxis with an ACE inhibitor.  She continued on hydralazine and Imdur was added to her regimen.  She was diuresed with IV Lasix.  She had symptoms concerning for TIA and wore an ambulatory monitor 12/2020 that revealed frequent PACs and rare PVCs.  Repeat echo 12/2020 revealed no change in LVEF at 40 to 45% and mild LVH.  She had grade 1 diastolic dysfunction and moderate aortic stenosis (mean gradient 15 mmHg.).  She followed up with [name], NP on 01/2021 and reported labile blood pressures but was otherwise stable.  She was seen in the hospital 02/2021 with an episode of syncope.  She reported frequent episodes of shortness of breath that were responsive to Xanax.  On the day of her event she took her medicines and about 30 minutes later started feeling poorly.  Her daughter reported her eyes rolling back into her head and her losing consciousness.  Her breathing was noted to be abnormal.  The daughter reported that she was unresponsive for about 4 minutes.  There were no arrhythmias on telemetry during her hospitalization.  There  is some concern it may be due to her blood pressure medications and her amlodipine and Imdur were moved to the evening.  She saw Palliative Care in home yesterday and still lives alone.  Lately she has been feeling well.  Her BP has been ranging in the 120-150s. She has no chest pain.  Her daughter notes that when EMS came for her syncopal episode she did report having chest pain.  Since that time she denies any exertional chest pain.  Her breathing is mostly stable but she gets short of breath when it is humid.  She rarely has to use her nebulizer.  She has not had any lower extremity edema, orthopnea, or PND.  She denies any recurrent syncope and has been trying to increase her fluid intake.   Past Medical History:  Diagnosis Date   Anxiety    Aortic arch atherosclerosis ([place]) 06/24/2014   Atherosclerotic ulcer of aorta ([facility]) 06/24/2014   Carotid artery occlusion    Chronic combined systolic and diastolic heart failure ([facility]) 09/01/2020   Chronic kidney disease    COPD (chronic obstructive pulmonary disease) ([place])    [place] DISEASE, LUMBAR 12/16/2008   DIVERTICULOSIS, COLON 09/30/2008   DYSPNEA 07/13/2008   [place]ves disease    History of embolic stroke [id number]   Left brain   HYPERLIPIDEMIA 03/06/2007   HYPERTENSION 03/06/2007   HYPOTHYROIDISM 10/13/2007   OSTEOARTHRITIS 03/06/2007   Personal history of colonic polyps 09/30/2008   Stroke ([facility])    06/2014  TOBACCO USE, QUIT 04/12/2009   WEAKNESS 11/09/2007    Past Surgical History:  Procedure Laterality Date   CARDIAC CATHETERIZATION     CATARACT EXTRACTION Bilateral    CHOLECYSTECTOMY     ENDARTERECTOMY Left 11/13/2015   Procedure: LEFT CAROTID ARTERY ENDARTERECTOMY;  Surgeon: Conrad Wall, MD;  Location: [facility];  Service: Vascular;  Laterality: Left;   ESOPHAGOGASTRODUODENOSCOPY (EGD) WITH PROPOFOL N/A 10/11/2016   Procedure: ESOPHAGOGASTRODUODENOSCOPY (EGD) WITH PROPOFOL;  Surgeon: Mauri Pole, MD;  Location: [facility];   Service: Endoscopy;  Laterality: N/A;   KNEE SURGERY     PATCH ANGIOPLASTY Left 11/13/2015   Procedure: WITH 1CM X 6CM  XENOSURE BIOLOGIC PATCH ANGIOPLASTY;  Surgeon: Conrad , MD;  Location: [facility];  Service: Vascular;  Laterality: Left;   TEE WITHOUT CARDIOVERSION N/A 06/24/2014   Procedure: TRANSESOPHAGEAL ECHOCARDIOGRAM (TEE);  Surgeon: Sanda Klein, MD;  Location: [facility] END[facility]COPY;  Service: Cardiovascular;  Laterality: N/A;     Current Outpatient Medications  Medication Sig Dispense Refill   albuterol (PROAIR HFA) 108 (90 Base) MCG/ACT inhaler Inhale 2 puffs into the lungs every 6 (six) hours as needed for wheezing or shortness of breath. 8.5 Inhaler 5   ALPRAZolam (XANAX) 0.5 MG tablet Take 1 tablet (0.5 mg total) by mouth 2 (two) times daily. (Patient taking differently: Take 0.5 mg by mouth 3 (three) times daily.) 60 tablet 2   amLODipine (NORVASC) 5 MG tablet Take 1 tablet (5 mg total) by mouth at bedtime. 90 tablet 3   aspirin EC 81 MG EC tablet Take 1 tablet (81 mg total) by mouth daily. Swallow whole. (Patient taking differently: Take 81 mg by mouth at bedtime. Swallow whole.)     atorvastatin (LIPITOR) 80 MG tablet TAKE 1 TABLET BY MOUTH EVERY DAY AT 6PM (Patient taking differently: Take 80 mg by mouth at bedtime. TAKE 1 TABLET BY MOUTH EVERY DAY AT 6PM) 90 tablet 3   Cyanocobalamin (B-12 PO) Take 1 tablet by mouth daily.     Fluticasone-Umeclidin-Vilant (TRELEGY ELLIPTA) 100-62.5-25 MCG/INH AEPB INHALE 1 PUFF BY MOUTH EVERY DAY (Patient taking differently: Inhale 1 puff into the lungs daily.) 60 each 3   furosemide (LASIX) 20 MG tablet Take 1 tablet (20 mg total) by mouth daily. 90 tablet 3   hydrALAZINE (APRESOLINE) 50 MG tablet Take 1 tablet (50 mg total) by mouth 3 (three) times daily. 270 tablet 3   ipratropium-albuterol (DUONEB) 0.5-2.5 (3) MG/3ML SOLN Take 3 mLs by nebulization every 6 (six) hours as needed (copd). 360 mL 11   isosorbide mononitrate (IMDUR) 30 MG 24 hr tablet  Take 1 tablet (30 mg total) by mouth at bedtime. 90 tablet 3   latanoprost (XALATAN) 0.005 % ophthalmic solution Place 1 drop into both eyes at bedtime.      levothyroxine (SYNTHROID) 75 MCG tablet Take 1 tablet (75 mcg total) by mouth daily before breakfast. 90 tablet 3   pantoprazole (PROTONIX) 40 MG tablet TAKE 1 TABLET BY MOUTH EVERY DAY (Patient taking differently: Take 40 mg by mouth daily.) 30 tablet 2   potassium chloride SA (KLOR-CON) 20 MEQ tablet Take 1 tablet (20 mEq total) by mouth daily. 90 tablet 3   predniSONE (DELTASONE) 10 MG tablet Take 1 tablet (10 mg total) by mouth daily with breakfast. 90 tablet 1   No current facility-administered medications for this visit.    Allergies:   Catapres [clonidine hcl], Clonidine, Lisinopril, Labetalol, and Labetalol hcl    Social History:  The patient  reports that she quit smoking about 42 years ago. Her smoking use included cigarettes. She started smoking about 71 years ago. She has a 43.50 pack-year smoking history. She has never used smokeless tobacco. She reports current alcohol use of about 1.0 standard drink per week. She reports that she does not use drugs.   Family History:  The patient's family history includes Stomach cancer in her maternal grandmother.    ROS:  Please see the history of present illness.   Otherwise, review of systems are positive for none.   All other systems are reviewed and negative.    PHYSICAL EXAM: VS:  BP (!) 148/70   Pulse 91   Ht '5\' 6"'$  (1.676 m)   Wt 130 lb 6.4 oz (59.1 kg)   SpO2 94%   BMI 21.05 kg/m  , BMI Body mass index is 21.05 kg/m. GENERAL:  Well appearing HEENT:  Pupils equal round and reactive, fundi not visualized, oral mucosa unremarkable NECK:  No jugular venous distention, waveform within normal limits, carotid upstroke brisk and symmetric, no bruits, no thyromegaly LYMPHATICS:  No cervical adenopathy LUNGS:  Clear to auscultation bilaterally HEART:  RRR.  PMI not displaced or  sustained,S1 and S2 within normal limits, no S3, no S4, no clicks, no rubs, III/VI mid-peaking systolic murmur at the LUSB ABD:  Flat, positive bowel sounds normal in frequency in pitch, no bruits, no rebound, no guarding, no midline pulsatile mass, no hepatomegaly, no splenomegaly EXT:  2 plus pulses throughout, no edema, no cyanosis no clubbing SKIN: Thin.  Multiple ecchymoses. NEURO:  Cranial nerves II through XII grossly intact, motor grossly intact throughout PSYCH:  Cognitively intact, oriented to person place and time   EKG:  EKG is not ordered today.   Echo 08/31/20:  1. Left ventricular ejection fraction, by estimation, is 40 to 45%. The  left ventricle has mildly decreased function. The left ventricle has no  regional wall motion abnormalities. Left ventricular diastolic parameters  are consistent with Grade I  diastolic dysfunction (impaired relaxation).   2. Right ventricular systolic function is normal. The right ventricular  size is normal.   3. Left atrial size was mildly dilated.   4. Moderate pleural effusion.   5. The mitral valve is normal in structure. Mild mitral valve  regurgitation. No evidence of mitral stenosis.   6. Fixed right coronary cusp. The aortic valve is tricuspid. There is  moderate calcification of the aortic valve. There is moderate thickening  of the aortic valve. Aortic valve regurgitation is trivial. Moderate  aortic valve stenosis. Aortic valve mean  gradient measures 19.5 mmHg. Aortic valve Vmax measures 3.04 m/s.   7. The inferior vena cava is normal in size with greater than 50%  respiratory variability, suggesting right atrial pressure of 3 mmHg.      Recent Labs: 08/31/2020: TSH 3.587 11/29/2020: B Natriuretic Peptide 446.3 02/26/2021: ALT 17 02/27/2021: Hemoglobin 11.1; Magnesium 2.3; Platelets 165 02/28/2021: BUN 25; Creatinine, Ser 1.37; Potassium 3.9; Sodium 139    Lipid Panel    Component Value Date/Time   CHOL 159 12/25/2020  0432   CHOL 218 (H) 03/24/2019 1539   TRIG 56 12/25/2020 0432   HDL 74 12/25/2020 0432   HDL 111 03/24/2019 1539   CHOLHDL 2.1 12/25/2020 0432   VLDL 11 12/25/2020 0432   LDLCALC 74 12/25/2020 0432   LDLCALC 94 03/24/2019 1539   LDLDIRECT 141.7 05/01/2011 1037      Wt Readings from Last 3 Encounters:  03/15/21  130 lb 6.4 oz (59.1 kg)  03/06/21 130 lb (59 kg)  02/28/21 125 lb 4.8 oz (56.8 kg)      ASSESSMENT AND PLAN:  Mild aortic stenosis Mean gradient 15 mmHg on echo 12/2020.  Continue to monitor.    Chronic combined systolic and diastolic heart failure (HCC) LVEF 40-45%.  Ischemic work up is deferred.  She is euvolemic.  Continue lasix, hydralazine and Imdur.  She is not on a beta-blocker due to bradycardia and she is not on an ACE inhibitor or ARB due to anaphylaxis.  Mixed hyperlipidemia Lipids well-controlled on atorvastatin.   Syncope and collapse She had a recent episode of syncope that seem to be in the setting of intravascular volume depletion and taking her morning medications.  She has been in the emergency department the night before and had not had much to eat.  She notes that she has not been drinking much.  She is going to work on increasing her fluid intake.  Some of her medications were moved to evening and she seems to be doing better.  Her blood pressures not quite at goal.  However given her syncopal episodes we will aim for blood pressures around [id number] systolic.  Essential hypertension BP target is around [id number] systolic given her syncope.  Continue amlodipine, hydralazine, and Imdur.  Stroke She has a history of GI bleeding from AVMs.  There was consideration for her being on anticoagulation due to concern for atrial fibrillation.  However there is no clear evidence of any atrial fibrillation that has been seen on monitoring either in the hospital or on ambulatory monitors.  Therefore continue with just aspirin for now.  She seems to be tolerating this well with  the addition of pantoprazole.    Current medicines are reviewed at length with the patient today.  The patient does not have concerns regarding medicines.  The following changes have been made:  no change  Labs/ tests ordered today include:  No orders of the defined types were placed in this encounter.    Disposition:   FU with Leighla Chestnutt C. Oval Linsey, MD, [facility] in 4 months    Signed, Jailynne Opperman C. Oval Linsey, MD, [facility]  03/15/2021 11:12 AM    [place]

## 2021-03-15 NOTE — Assessment & Plan Note (Signed)
BP target is around [id number] systolic given her syncope.  Continue amlodipine, hydralazine, and Imdur.

## 2021-03-15 NOTE — Telephone Encounter (Signed)
If appropriate can you sign off on this order in the absence of [place]?

## 2021-03-15 NOTE — Telephone Encounter (Signed)
[name], [phone], called from [facility] home health called to request orders for a wound that is on the PT right lower leg to provide skin care. [name] would like a call to clarify the order to make sure it is right.

## 2021-03-15 NOTE — Assessment & Plan Note (Signed)
She had a recent episode of syncope that seem to be in the setting of intravascular volume depletion and taking her morning medications.  She has been in the emergency department the night before and had not had much to eat.  She notes that she has not been drinking much.  She is going to work on increasing her fluid intake.  Some of her medications were moved to evening and she seems to be doing better.  Her blood pressures not quite at goal.  However given her syncopal episodes we will aim for blood pressures around [id number] systolic.

## 2021-03-15 NOTE — Patient Instructions (Addendum)
Medication Instructions:  Your physician recommends that you continue on your current medications as directed. Please refer to the Current Medication list given to you today.   *If you need a refill on your cardiac medications before your next appointment, please call your pharmacy*  Lab Work: NONE  Testing/Procedures: NONE  Follow-Up: At [facility], you and your health needs are our priority.  As part of our continuing mission to provide you with exceptional heart care, we have created designated Provider Care Teams.  These Care Teams include your primary Cardiologist (physician) and Advanced Practice Providers (APPs -  Physician Assistants and Nurse Practitioners) who all work together to provide you with the care you need, when you need it.  We recommend signing up for the patient portal called "MyChart".  Sign up information is provided on this After Visit Summary.  MyChart is used to connect with patients for Virtual Visits (Telemedicine).  Patients are able to view lab/test results, encounter notes, upcoming appointments, etc.  Non-urgent messages can be sent to your provider as well.   To learn more about what you can do with MyChart, go to [email or web address].    Your next appointment:   4 MONTHS   The format for your next appointment:   In Person  Provider:   Skeet Latch, MD or [name], NP

## 2021-03-19 DIAGNOSIS — I5021 Acute systolic (congestive) heart failure: Secondary | ICD-10-CM | POA: Diagnosis not present

## 2021-03-19 DIAGNOSIS — I700 Atherosclerosis of aorta: Secondary | ICD-10-CM | POA: Diagnosis not present

## 2021-03-19 DIAGNOSIS — I447 Left bundle-branch block, unspecified: Secondary | ICD-10-CM | POA: Diagnosis not present

## 2021-03-19 DIAGNOSIS — I5022 Chronic systolic (congestive) heart failure: Secondary | ICD-10-CM | POA: Diagnosis not present

## 2021-03-19 DIAGNOSIS — I671 Cerebral aneurysm, nonruptured: Secondary | ICD-10-CM | POA: Diagnosis not present

## 2021-03-19 DIAGNOSIS — I130 Hypertensive heart and chronic kidney disease with heart failure and stage 1 through stage 4 chronic kidney disease, or unspecified chronic kidney disease: Secondary | ICD-10-CM | POA: Diagnosis not present

## 2021-03-19 DIAGNOSIS — N183 Chronic kidney disease, stage 3 unspecified: Secondary | ICD-10-CM | POA: Diagnosis not present

## 2021-03-19 DIAGNOSIS — I69351 Hemiplegia and hemiparesis following cerebral infarction affecting right dominant side: Secondary | ICD-10-CM | POA: Diagnosis not present

## 2021-03-19 DIAGNOSIS — D631 Anemia in chronic kidney disease: Secondary | ICD-10-CM | POA: Diagnosis not present

## 2021-03-19 NOTE — Telephone Encounter (Signed)
Ok for wound care.

## 2021-03-21 DIAGNOSIS — I447 Left bundle-branch block, unspecified: Secondary | ICD-10-CM | POA: Diagnosis not present

## 2021-03-21 DIAGNOSIS — I700 Atherosclerosis of aorta: Secondary | ICD-10-CM | POA: Diagnosis not present

## 2021-03-21 DIAGNOSIS — I130 Hypertensive heart and chronic kidney disease with heart failure and stage 1 through stage 4 chronic kidney disease, or unspecified chronic kidney disease: Secondary | ICD-10-CM | POA: Diagnosis not present

## 2021-03-21 DIAGNOSIS — I5022 Chronic systolic (congestive) heart failure: Secondary | ICD-10-CM | POA: Diagnosis not present

## 2021-03-21 DIAGNOSIS — I671 Cerebral aneurysm, nonruptured: Secondary | ICD-10-CM | POA: Diagnosis not present

## 2021-03-21 DIAGNOSIS — I5021 Acute systolic (congestive) heart failure: Secondary | ICD-10-CM | POA: Diagnosis not present

## 2021-03-21 DIAGNOSIS — I69351 Hemiplegia and hemiparesis following cerebral infarction affecting right dominant side: Secondary | ICD-10-CM | POA: Diagnosis not present

## 2021-03-21 DIAGNOSIS — D631 Anemia in chronic kidney disease: Secondary | ICD-10-CM | POA: Diagnosis not present

## 2021-03-21 DIAGNOSIS — N183 Chronic kidney disease, stage 3 unspecified: Secondary | ICD-10-CM | POA: Diagnosis not present

## 2021-03-22 NOTE — Telephone Encounter (Signed)
Left message to return phone call.

## 2021-03-23 DIAGNOSIS — I5021 Acute systolic (congestive) heart failure: Secondary | ICD-10-CM | POA: Diagnosis not present

## 2021-03-23 DIAGNOSIS — I671 Cerebral aneurysm, nonruptured: Secondary | ICD-10-CM | POA: Diagnosis not present

## 2021-03-23 DIAGNOSIS — N183 Chronic kidney disease, stage 3 unspecified: Secondary | ICD-10-CM | POA: Diagnosis not present

## 2021-03-23 DIAGNOSIS — I447 Left bundle-branch block, unspecified: Secondary | ICD-10-CM | POA: Diagnosis not present

## 2021-03-23 DIAGNOSIS — D631 Anemia in chronic kidney disease: Secondary | ICD-10-CM | POA: Diagnosis not present

## 2021-03-23 DIAGNOSIS — I5022 Chronic systolic (congestive) heart failure: Secondary | ICD-10-CM | POA: Diagnosis not present

## 2021-03-23 DIAGNOSIS — I69351 Hemiplegia and hemiparesis following cerebral infarction affecting right dominant side: Secondary | ICD-10-CM | POA: Diagnosis not present

## 2021-03-23 DIAGNOSIS — I700 Atherosclerosis of aorta: Secondary | ICD-10-CM | POA: Diagnosis not present

## 2021-03-23 DIAGNOSIS — I130 Hypertensive heart and chronic kidney disease with heart failure and stage 1 through stage 4 chronic kidney disease, or unspecified chronic kidney disease: Secondary | ICD-10-CM | POA: Diagnosis not present

## 2021-03-26 ENCOUNTER — Encounter: Payer: Self-pay | Admitting: Adult Health

## 2021-03-26 NOTE — Telephone Encounter (Signed)
Glenard Haring stated that the wound was taking care of it was a minor tear in the skin. No further action needed!

## 2021-03-27 DIAGNOSIS — N183 Chronic kidney disease, stage 3 unspecified: Secondary | ICD-10-CM | POA: Diagnosis not present

## 2021-03-27 DIAGNOSIS — D631 Anemia in chronic kidney disease: Secondary | ICD-10-CM | POA: Diagnosis not present

## 2021-03-27 DIAGNOSIS — I5021 Acute systolic (congestive) heart failure: Secondary | ICD-10-CM | POA: Diagnosis not present

## 2021-03-27 DIAGNOSIS — I69351 Hemiplegia and hemiparesis following cerebral infarction affecting right dominant side: Secondary | ICD-10-CM | POA: Diagnosis not present

## 2021-03-27 DIAGNOSIS — I700 Atherosclerosis of aorta: Secondary | ICD-10-CM | POA: Diagnosis not present

## 2021-03-27 DIAGNOSIS — I671 Cerebral aneurysm, nonruptured: Secondary | ICD-10-CM | POA: Diagnosis not present

## 2021-03-27 DIAGNOSIS — I5022 Chronic systolic (congestive) heart failure: Secondary | ICD-10-CM | POA: Diagnosis not present

## 2021-03-27 DIAGNOSIS — I130 Hypertensive heart and chronic kidney disease with heart failure and stage 1 through stage 4 chronic kidney disease, or unspecified chronic kidney disease: Secondary | ICD-10-CM | POA: Diagnosis not present

## 2021-03-27 DIAGNOSIS — I447 Left bundle-branch block, unspecified: Secondary | ICD-10-CM | POA: Diagnosis not present

## 2021-03-27 NOTE — Telephone Encounter (Signed)
FYI

## 2021-03-29 DIAGNOSIS — I350 Nonrheumatic aortic (valve) stenosis: Secondary | ICD-10-CM | POA: Diagnosis not present

## 2021-03-29 DIAGNOSIS — E039 Hypothyroidism, unspecified: Secondary | ICD-10-CM | POA: Diagnosis not present

## 2021-03-29 DIAGNOSIS — I700 Atherosclerosis of aorta: Secondary | ICD-10-CM | POA: Diagnosis not present

## 2021-03-29 DIAGNOSIS — N183 Chronic kidney disease, stage 3 unspecified: Secondary | ICD-10-CM | POA: Diagnosis not present

## 2021-03-29 DIAGNOSIS — I4891 Unspecified atrial fibrillation: Secondary | ICD-10-CM | POA: Diagnosis not present

## 2021-03-29 DIAGNOSIS — D631 Anemia in chronic kidney disease: Secondary | ICD-10-CM | POA: Diagnosis not present

## 2021-03-29 DIAGNOSIS — I69351 Hemiplegia and hemiparesis following cerebral infarction affecting right dominant side: Secondary | ICD-10-CM | POA: Diagnosis not present

## 2021-03-29 DIAGNOSIS — I130 Hypertensive heart and chronic kidney disease with heart failure and stage 1 through stage 4 chronic kidney disease, or unspecified chronic kidney disease: Secondary | ICD-10-CM | POA: Diagnosis not present

## 2021-03-29 DIAGNOSIS — I5023 Acute on chronic systolic (congestive) heart failure: Secondary | ICD-10-CM | POA: Diagnosis not present

## 2021-03-30 ENCOUNTER — Telehealth: Payer: Self-pay | Admitting: Adult Health

## 2021-03-30 NOTE — Telephone Encounter (Signed)
Verbal orders given to Cara. 

## 2021-03-30 NOTE — Telephone Encounter (Signed)
Okay for verbal orders? Please advise 

## 2021-03-30 NOTE — Telephone Encounter (Signed)
Cara from [facility] called to state that they are requesting to extend orders for nursing for once a week for four weeks to continue to help patient with her heart failure and hypertension.  Cara's contact number is [phone] and she says that the line is a secure line so it is okay to leave a voicemail if needed.  Please advise.

## 2021-04-05 DIAGNOSIS — I130 Hypertensive heart and chronic kidney disease with heart failure and stage 1 through stage 4 chronic kidney disease, or unspecified chronic kidney disease: Secondary | ICD-10-CM | POA: Diagnosis not present

## 2021-04-05 DIAGNOSIS — I5023 Acute on chronic systolic (congestive) heart failure: Secondary | ICD-10-CM | POA: Diagnosis not present

## 2021-04-05 DIAGNOSIS — D631 Anemia in chronic kidney disease: Secondary | ICD-10-CM | POA: Diagnosis not present

## 2021-04-05 DIAGNOSIS — I700 Atherosclerosis of aorta: Secondary | ICD-10-CM | POA: Diagnosis not present

## 2021-04-05 DIAGNOSIS — I4891 Unspecified atrial fibrillation: Secondary | ICD-10-CM | POA: Diagnosis not present

## 2021-04-05 DIAGNOSIS — I350 Nonrheumatic aortic (valve) stenosis: Secondary | ICD-10-CM | POA: Diagnosis not present

## 2021-04-05 DIAGNOSIS — N183 Chronic kidney disease, stage 3 unspecified: Secondary | ICD-10-CM | POA: Diagnosis not present

## 2021-04-05 DIAGNOSIS — I69351 Hemiplegia and hemiparesis following cerebral infarction affecting right dominant side: Secondary | ICD-10-CM | POA: Diagnosis not present

## 2021-04-05 DIAGNOSIS — E039 Hypothyroidism, unspecified: Secondary | ICD-10-CM | POA: Diagnosis not present

## 2021-04-06 DIAGNOSIS — E039 Hypothyroidism, unspecified: Secondary | ICD-10-CM | POA: Diagnosis not present

## 2021-04-06 DIAGNOSIS — I130 Hypertensive heart and chronic kidney disease with heart failure and stage 1 through stage 4 chronic kidney disease, or unspecified chronic kidney disease: Secondary | ICD-10-CM | POA: Diagnosis not present

## 2021-04-06 DIAGNOSIS — I350 Nonrheumatic aortic (valve) stenosis: Secondary | ICD-10-CM | POA: Diagnosis not present

## 2021-04-06 DIAGNOSIS — D631 Anemia in chronic kidney disease: Secondary | ICD-10-CM | POA: Diagnosis not present

## 2021-04-06 DIAGNOSIS — I5023 Acute on chronic systolic (congestive) heart failure: Secondary | ICD-10-CM | POA: Diagnosis not present

## 2021-04-06 DIAGNOSIS — I700 Atherosclerosis of aorta: Secondary | ICD-10-CM | POA: Diagnosis not present

## 2021-04-06 DIAGNOSIS — I4891 Unspecified atrial fibrillation: Secondary | ICD-10-CM | POA: Diagnosis not present

## 2021-04-06 DIAGNOSIS — I69351 Hemiplegia and hemiparesis following cerebral infarction affecting right dominant side: Secondary | ICD-10-CM | POA: Diagnosis not present

## 2021-04-06 DIAGNOSIS — N183 Chronic kidney disease, stage 3 unspecified: Secondary | ICD-10-CM | POA: Diagnosis not present

## 2021-04-12 DIAGNOSIS — I69351 Hemiplegia and hemiparesis following cerebral infarction affecting right dominant side: Secondary | ICD-10-CM | POA: Diagnosis not present

## 2021-04-12 DIAGNOSIS — I700 Atherosclerosis of aorta: Secondary | ICD-10-CM | POA: Diagnosis not present

## 2021-04-12 DIAGNOSIS — I130 Hypertensive heart and chronic kidney disease with heart failure and stage 1 through stage 4 chronic kidney disease, or unspecified chronic kidney disease: Secondary | ICD-10-CM | POA: Diagnosis not present

## 2021-04-12 DIAGNOSIS — E039 Hypothyroidism, unspecified: Secondary | ICD-10-CM | POA: Diagnosis not present

## 2021-04-12 DIAGNOSIS — I5023 Acute on chronic systolic (congestive) heart failure: Secondary | ICD-10-CM | POA: Diagnosis not present

## 2021-04-12 DIAGNOSIS — N183 Chronic kidney disease, stage 3 unspecified: Secondary | ICD-10-CM | POA: Diagnosis not present

## 2021-04-12 DIAGNOSIS — D631 Anemia in chronic kidney disease: Secondary | ICD-10-CM | POA: Diagnosis not present

## 2021-04-12 DIAGNOSIS — I350 Nonrheumatic aortic (valve) stenosis: Secondary | ICD-10-CM | POA: Diagnosis not present

## 2021-04-12 DIAGNOSIS — I4891 Unspecified atrial fibrillation: Secondary | ICD-10-CM | POA: Diagnosis not present

## 2021-04-13 ENCOUNTER — Telehealth: Payer: Self-pay | Admitting: *Deleted

## 2021-04-13 NOTE — Telephone Encounter (Signed)
Patient's daughter [name] returned my call. Palliative care follow up visit scheduled for 04/20/21 @ 930a.

## 2021-04-13 NOTE — Telephone Encounter (Signed)
Received a communication from palliative administrative staff stating patient's daughter [name] called and is requesting to schedule a visit. Returned call to Ivor and left a voicemail with my contact information.

## 2021-04-18 DIAGNOSIS — I5023 Acute on chronic systolic (congestive) heart failure: Secondary | ICD-10-CM | POA: Diagnosis not present

## 2021-04-18 DIAGNOSIS — I700 Atherosclerosis of aorta: Secondary | ICD-10-CM | POA: Diagnosis not present

## 2021-04-18 DIAGNOSIS — D631 Anemia in chronic kidney disease: Secondary | ICD-10-CM | POA: Diagnosis not present

## 2021-04-18 DIAGNOSIS — E039 Hypothyroidism, unspecified: Secondary | ICD-10-CM | POA: Diagnosis not present

## 2021-04-18 DIAGNOSIS — I69351 Hemiplegia and hemiparesis following cerebral infarction affecting right dominant side: Secondary | ICD-10-CM | POA: Diagnosis not present

## 2021-04-18 DIAGNOSIS — I4891 Unspecified atrial fibrillation: Secondary | ICD-10-CM | POA: Diagnosis not present

## 2021-04-18 DIAGNOSIS — N183 Chronic kidney disease, stage 3 unspecified: Secondary | ICD-10-CM | POA: Diagnosis not present

## 2021-04-18 DIAGNOSIS — I350 Nonrheumatic aortic (valve) stenosis: Secondary | ICD-10-CM | POA: Diagnosis not present

## 2021-04-18 DIAGNOSIS — I130 Hypertensive heart and chronic kidney disease with heart failure and stage 1 through stage 4 chronic kidney disease, or unspecified chronic kidney disease: Secondary | ICD-10-CM | POA: Diagnosis not present

## 2021-04-20 ENCOUNTER — Other Ambulatory Visit: Payer: Medicare PPO | Admitting: *Deleted

## 2021-04-20 ENCOUNTER — Other Ambulatory Visit: Payer: Medicare PPO

## 2021-04-20 ENCOUNTER — Other Ambulatory Visit: Payer: Self-pay

## 2021-04-20 VITALS — BP 140/75 | HR 68 | Temp 97.9°F | Resp 18

## 2021-04-20 DIAGNOSIS — I5023 Acute on chronic systolic (congestive) heart failure: Secondary | ICD-10-CM | POA: Diagnosis not present

## 2021-04-20 DIAGNOSIS — E039 Hypothyroidism, unspecified: Secondary | ICD-10-CM | POA: Diagnosis not present

## 2021-04-20 DIAGNOSIS — I350 Nonrheumatic aortic (valve) stenosis: Secondary | ICD-10-CM | POA: Diagnosis not present

## 2021-04-20 DIAGNOSIS — I130 Hypertensive heart and chronic kidney disease with heart failure and stage 1 through stage 4 chronic kidney disease, or unspecified chronic kidney disease: Secondary | ICD-10-CM | POA: Diagnosis not present

## 2021-04-20 DIAGNOSIS — I700 Atherosclerosis of aorta: Secondary | ICD-10-CM | POA: Diagnosis not present

## 2021-04-20 DIAGNOSIS — N183 Chronic kidney disease, stage 3 unspecified: Secondary | ICD-10-CM | POA: Diagnosis not present

## 2021-04-20 DIAGNOSIS — Z515 Encounter for palliative care: Secondary | ICD-10-CM

## 2021-04-20 DIAGNOSIS — I4891 Unspecified atrial fibrillation: Secondary | ICD-10-CM | POA: Diagnosis not present

## 2021-04-20 DIAGNOSIS — I69351 Hemiplegia and hemiparesis following cerebral infarction affecting right dominant side: Secondary | ICD-10-CM | POA: Diagnosis not present

## 2021-04-20 DIAGNOSIS — D631 Anemia in chronic kidney disease: Secondary | ICD-10-CM | POA: Diagnosis not present

## 2021-04-20 NOTE — Progress Notes (Signed)
COMMUNITY PALLIATIVE CARE SW NOTE  PATIENT NAME: [name] DOB: [birth year] MRN: [id number]  PRIMARY CARE PROVIDER: [name], NP  RESPONSIBLE PARTY:  Acct ID - Guarantor Home Phone Work Phone Relationship Acct Type  [id number] [name]  Self P/F     [address], [place], [place] [address]     PLAN OF CARE and INTERVENTIONS:             GOALS OF CARE/ ADVANCE CARE PLANNING:  Goal is for patient to remain in her home, as independent and safe as possible. Patient is a DNR. SOCIAL/EMOTIONAL/SPIRITUAL ASSESSMENT/ INTERVENTIONS:  SW and RN-M. Nadara Mustard completed a  follow-up palliative visit with patient at her home. Patient was sitting in her recliner the living-room,awake, alert and in a great mood, she was cordial, humorous and engaged. Patient reported that she is reading a new book. She denied pain. Patient reported that she was doing well and has had no major changes in her condition. She report that she will be re-evaluated next week for physical therapy. She is hoping that she will be able to continue with physical therapy as she feels it will help. She is sleeping well. She ambulates independently and continues to go with friends and family for lunch, mani/pedicures and to have her hair done, along with doctor's appointments. She reports that her appetite remains good. She also has a private caregiver 2x/week. Patient also has a life alert button that she wears around her neck. Patient verbalized no concerns. Her daughter lives across the street and is accessible for any needs/support daily. Patient remains open to ongoing palliative care visits/support. The team reinforced contact information and encouraged her to call with any questions or concerns.  PATIENT/CAREGIVER EDUCATION/ COPING:  Patient is coping well. She have a great support network of friends and family.  PERSONAL EMERGENCY PLAN:  911 can be activated for emergencies. COMMUNITY RESOURCES COORDINATION/  HEALTH CARE NAVIGATION:  Patient has a private caregiver 2x/week.  FINANCIAL/LEGAL CONCERNS/INTERVENTIONS:  None.     SOCIAL HX:  Social History   Tobacco Use   Smoking status: Former    Packs/day: 1.50    Years: 29.00    Pack years: 43.50    Types: Cigarettes    Start date: 07/08/1949    Quit date: 07/08/1978    Years since quitting: 42.8   Smokeless tobacco: Never  Substance Use Topics   Alcohol use: Yes    Alcohol/week: 1.0 standard drink    Types: 1 Glasses of wine per week    Comment: "red wine"- some nights    CODE STATUS: DNR ADVANCED DIRECTIVES: Yes MOST FORM COMPLETE:  No HOSPICE EDUCATION PROVIDED: No  PPS: Patient is alert and oriented x3. Patient is independent for ADL's and ambulates with a walker.   Duration of visit and documentation: 60 minutes.   [address] [place], [place]

## 2021-04-20 NOTE — Progress Notes (Signed)
[facility] COMMUNITY PALLIATIVE CARE RN NOTE  PATIENT NAME: [name] DOB: 07-10-1924 MRN: [id number]  PRIMARY CARE PROVIDER: [name], NP  RESPONSIBLE PARTY:  Acct ID - Guarantor Home Phone Work Phone Relationship Acct Type  [id number] Gloria, [name]  Self P/F     [address], [place], [place] [address]   Covid-19 Pre-screening Negative  PLAN OF CARE and INTERVENTION:  ADVANCE CARE PLANNING/GOALS OF CARE: Goal is for patient to remain in her home.  PATIENT/CAREGIVER EDUCATION: Symptom management, safe mobility DISEASE STATUS: Joint follow-up visit made with LCSW, M. Lonon. Met with patient in her home. Upon arrival she is sitting up in her recliner awake and alert. She denies any pain or discomfort. Denies any dizziness, shortness of breath or weakness on either side of her body. She says that she is "doing great." She has been participating in in-home therapy with PT. She had a visit with them 2 days ago. They are working on her balance and she feels this is improving. She will be re-evaluated by PT next week. She remains ambulatory using her walker. She has a good appetite. Denies dysphagia. Her appetite is good. Overall she feels that she is doing well. No issues or concerns at this time. She enjoys reading and has recently started on a new book. She is appreciative of visit.  HISTORY OF PRESENT ILLNESS: This is a 87 yo female with a history of CHF, CVA, mild aortic stenosis, COPD, hypothyroidism, chronic kidney disease Stage 3 and osteoarthritis. Palliative care team continues to follow patient for symptom management, goals of care and complex decision making.    CODE STATUS: DNR ADVANCED DIRECTIVES: Y MOST FORM: no PPS: 60%   PHYSICAL EXAM:   VITALS: Today's Vitals   04/20/21 0959  BP: 140/75  Pulse: 68  Resp: 18  Temp: 97.9 F (36.6 C)  TempSrc: Temporal  SpO2: 96%  PainSc: 0-No pain    LUNGS: clear to auscultation  CARDIAC: Cor  RRR EXTREMITIES: No edema SKIN:  Small scab noted to her left lower leg; Exposed skin is dry and intact; Denies any skin issues   NEURO:  Alert and oriented x 4, pleasant mood, hard of hearing, ambulatory w/walker   (Duration of visit and documentation 40 minutes)   Daryl Eastern, RN BSN

## 2021-04-21 ENCOUNTER — Other Ambulatory Visit: Payer: Self-pay | Admitting: Adult Health

## 2021-04-23 ENCOUNTER — Ambulatory Visit (HOSPITAL_BASED_OUTPATIENT_CLINIC_OR_DEPARTMENT_OTHER): Payer: Medicare PPO | Admitting: Cardiovascular Disease

## 2021-04-23 DIAGNOSIS — N183 Chronic kidney disease, stage 3 unspecified: Secondary | ICD-10-CM | POA: Diagnosis not present

## 2021-04-23 DIAGNOSIS — I69351 Hemiplegia and hemiparesis following cerebral infarction affecting right dominant side: Secondary | ICD-10-CM | POA: Diagnosis not present

## 2021-04-23 DIAGNOSIS — I5023 Acute on chronic systolic (congestive) heart failure: Secondary | ICD-10-CM | POA: Diagnosis not present

## 2021-04-23 DIAGNOSIS — I130 Hypertensive heart and chronic kidney disease with heart failure and stage 1 through stage 4 chronic kidney disease, or unspecified chronic kidney disease: Secondary | ICD-10-CM | POA: Diagnosis not present

## 2021-04-23 DIAGNOSIS — I700 Atherosclerosis of aorta: Secondary | ICD-10-CM | POA: Diagnosis not present

## 2021-04-23 DIAGNOSIS — I4891 Unspecified atrial fibrillation: Secondary | ICD-10-CM | POA: Diagnosis not present

## 2021-04-23 DIAGNOSIS — E039 Hypothyroidism, unspecified: Secondary | ICD-10-CM | POA: Diagnosis not present

## 2021-04-23 DIAGNOSIS — I350 Nonrheumatic aortic (valve) stenosis: Secondary | ICD-10-CM | POA: Diagnosis not present

## 2021-04-23 DIAGNOSIS — D631 Anemia in chronic kidney disease: Secondary | ICD-10-CM | POA: Diagnosis not present

## 2021-04-25 DIAGNOSIS — I350 Nonrheumatic aortic (valve) stenosis: Secondary | ICD-10-CM | POA: Diagnosis not present

## 2021-04-25 DIAGNOSIS — I69351 Hemiplegia and hemiparesis following cerebral infarction affecting right dominant side: Secondary | ICD-10-CM | POA: Diagnosis not present

## 2021-04-25 DIAGNOSIS — I5023 Acute on chronic systolic (congestive) heart failure: Secondary | ICD-10-CM | POA: Diagnosis not present

## 2021-04-25 DIAGNOSIS — I130 Hypertensive heart and chronic kidney disease with heart failure and stage 1 through stage 4 chronic kidney disease, or unspecified chronic kidney disease: Secondary | ICD-10-CM | POA: Diagnosis not present

## 2021-04-25 DIAGNOSIS — D631 Anemia in chronic kidney disease: Secondary | ICD-10-CM | POA: Diagnosis not present

## 2021-04-25 DIAGNOSIS — I700 Atherosclerosis of aorta: Secondary | ICD-10-CM | POA: Diagnosis not present

## 2021-04-25 DIAGNOSIS — E039 Hypothyroidism, unspecified: Secondary | ICD-10-CM | POA: Diagnosis not present

## 2021-04-25 DIAGNOSIS — N183 Chronic kidney disease, stage 3 unspecified: Secondary | ICD-10-CM | POA: Diagnosis not present

## 2021-04-25 DIAGNOSIS — I4891 Unspecified atrial fibrillation: Secondary | ICD-10-CM | POA: Diagnosis not present

## 2021-04-26 DIAGNOSIS — I700 Atherosclerosis of aorta: Secondary | ICD-10-CM | POA: Diagnosis not present

## 2021-04-26 DIAGNOSIS — I350 Nonrheumatic aortic (valve) stenosis: Secondary | ICD-10-CM | POA: Diagnosis not present

## 2021-04-26 DIAGNOSIS — D631 Anemia in chronic kidney disease: Secondary | ICD-10-CM | POA: Diagnosis not present

## 2021-04-26 DIAGNOSIS — I5023 Acute on chronic systolic (congestive) heart failure: Secondary | ICD-10-CM | POA: Diagnosis not present

## 2021-04-26 DIAGNOSIS — I69351 Hemiplegia and hemiparesis following cerebral infarction affecting right dominant side: Secondary | ICD-10-CM | POA: Diagnosis not present

## 2021-04-26 DIAGNOSIS — N183 Chronic kidney disease, stage 3 unspecified: Secondary | ICD-10-CM | POA: Diagnosis not present

## 2021-04-26 DIAGNOSIS — E039 Hypothyroidism, unspecified: Secondary | ICD-10-CM | POA: Diagnosis not present

## 2021-04-26 DIAGNOSIS — I4891 Unspecified atrial fibrillation: Secondary | ICD-10-CM | POA: Diagnosis not present

## 2021-04-26 DIAGNOSIS — I130 Hypertensive heart and chronic kidney disease with heart failure and stage 1 through stage 4 chronic kidney disease, or unspecified chronic kidney disease: Secondary | ICD-10-CM | POA: Diagnosis not present

## 2021-05-08 DIAGNOSIS — H353221 Exudative age-related macular degeneration, left eye, with active choroidal neovascularization: Secondary | ICD-10-CM | POA: Diagnosis not present

## 2021-05-08 DIAGNOSIS — H353113 Nonexudative age-related macular degeneration, right eye, advanced atrophic without subfoveal involvement: Secondary | ICD-10-CM | POA: Diagnosis not present

## 2021-05-14 DIAGNOSIS — H353221 Exudative age-related macular degeneration, left eye, with active choroidal neovascularization: Secondary | ICD-10-CM | POA: Diagnosis not present

## 2021-05-14 DIAGNOSIS — H401131 Primary open-angle glaucoma, bilateral, mild stage: Secondary | ICD-10-CM | POA: Diagnosis not present

## 2021-05-14 DIAGNOSIS — H31001 Unspecified chorioretinal scars, right eye: Secondary | ICD-10-CM | POA: Diagnosis not present

## 2021-05-19 ENCOUNTER — Other Ambulatory Visit: Payer: Self-pay | Admitting: Pulmonary Disease

## 2021-05-21 ENCOUNTER — Other Ambulatory Visit: Payer: Self-pay | Admitting: Adult Health

## 2021-07-12 ENCOUNTER — Encounter (HOSPITAL_BASED_OUTPATIENT_CLINIC_OR_DEPARTMENT_OTHER): Payer: Self-pay

## 2021-07-16 ENCOUNTER — Ambulatory Visit (INDEPENDENT_AMBULATORY_CARE_PROVIDER_SITE_OTHER): Payer: Medicare PPO | Admitting: Cardiovascular Disease

## 2021-07-16 ENCOUNTER — Other Ambulatory Visit: Payer: Self-pay

## 2021-07-16 ENCOUNTER — Encounter (HOSPITAL_BASED_OUTPATIENT_CLINIC_OR_DEPARTMENT_OTHER): Payer: Self-pay | Admitting: Cardiovascular Disease

## 2021-07-16 DIAGNOSIS — I471 Supraventricular tachycardia: Secondary | ICD-10-CM

## 2021-07-16 DIAGNOSIS — I5042 Chronic combined systolic (congestive) and diastolic (congestive) heart failure: Secondary | ICD-10-CM

## 2021-07-16 DIAGNOSIS — I639 Cerebral infarction, unspecified: Secondary | ICD-10-CM

## 2021-07-16 DIAGNOSIS — I350 Nonrheumatic aortic (valve) stenosis: Secondary | ICD-10-CM | POA: Insufficient documentation

## 2021-07-16 DIAGNOSIS — R55 Syncope and collapse: Secondary | ICD-10-CM | POA: Diagnosis not present

## 2021-07-16 DIAGNOSIS — I700 Atherosclerosis of aorta: Secondary | ICD-10-CM

## 2021-07-16 NOTE — Assessment & Plan Note (Signed)
No recurrent episodes.  Her episode occurred in the setting of intravascular volume depletion and being off of her usual regimen.  Continue to make sure she stays hydrated.

## 2021-07-16 NOTE — Assessment & Plan Note (Signed)
Moderate aortic stenosis on echo 12/2020.  She is asymptomatic and doing well.  She has been evaluated for TAVR and is not interested in any invasive procedures.  Receiving palliative services in her home.  She did have an episode of syncope, but it does not seem that it was caused by her aortic stenosis.

## 2021-07-16 NOTE — Assessment & Plan Note (Signed)
LVEF 40 to 45%.  Presumed ischemic cardiomyopathy.  She is doing well and is managed medically.  Continue amlodipine, hydralazine, and Imdur.  She isn't on ARB/ACE-I 2/2 renal dysfunction.  No beta blocker 2/2 bradycardia.  She is euvolemic and doing well.

## 2021-07-16 NOTE — Assessment & Plan Note (Signed)
Continue statin.  LDL was 74 on 12/2020.

## 2021-07-16 NOTE — Progress Notes (Signed)
Cardiology Office Note   Date:  07/16/2021   ID:  [name], DOB [birth year], MRN [id number]  PCP:  [name], NP  Cardiologist:   Skeet Latch, MD   No chief complaint on file.     History of Present Illness: [name] is a 88 y.o. female with chronic systolic diastolic heart failure, hypertension, carotid stenosis status post left CEA, severe atherosclerosis of the aorta, moderate aortic stenosis, hypertension, hyperlipidemia, COPD, GI bleed 2/2, and CKD IIIB  who presents for follow up.  She was admitted 08/2020 with acute systolic and diastolic heart failure.  LVEF was 40 to 45% down from 55% previously.  Given her history of GI bleeding and limited functional capacity, she and her family elected for conservative management.  Aspirin was added to her regimen.  She is not treated with beta-blockers due to bradycardia and syncope.  She previously had anaphylaxis with an ACE inhibitor.  She continued on hydralazine and Imdur was added to her regimen.  She was diuresed with IV Lasix.  She had symptoms concerning for TIA and wore an ambulatory monitor 12/2020 that revealed frequent PACs and rare PVCs.  Repeat echo 12/2020 revealed no change in LVEF at 40 to 45% and mild LVH.  She had grade 1 diastolic dysfunction and moderate aortic stenosis (mean gradient 15 mmHg.).  She followed up with [name], NP on 01/2021 and reported labile blood pressures but was otherwise stable.  She was seen in the hospital 02/2021 with an episode of syncope.  She reported frequent episodes of shortness of breath that were responsive to Xanax.  On the day of her event she took her medicines and about 30 minutes later started feeling poorly.  Her daughter reported her eyes rolling back into her head and her losing consciousness.  Her breathing was noted to be abnormal.  The daughter reported that she was unresponsive for about 4 minutes.  There were no arrhythmias on telemetry during her hospitalization.   There is some concern it may be due to her blood pressure medications and her amlodipine and Imdur were moved to the evening.  She was started on Palliative Care in home.  At her last visit she had a recent syncopal episode that was thought to be due to intravascular volume depletion and taking her morning medicine.  She had been in the emergency department the night before and had not been eating or drinking much.  We discussed try to increase her fluid intake.  Some of her medicines were also moved to the evening.  Her blood pressure target was set at 140 given her  syncope.  Overall she has felt well. She hasn't had any more syncopal episodes.  She struggles with macular degeneration.  She struggles to read more than 15 minutes.  She complains of easy bruising.  She has no chest pain.  She did have an episode of shortness of breath but that occurred after choking at the dinner table.  She has otherwise been well and has no lower extremity edema, orthopnea, or PND.  She notes that sometimes there is a difference in her blood pressure when she checks it in each arm.  She has no arm weakness, numbness, or tingling.   Past Medical History:  Diagnosis Date   Anxiety    Aortic arch atherosclerosis ([place]) 06/24/2014   Atherosclerotic ulcer of aorta ([place]) 06/24/2014   Carotid artery occlusion    Chronic combined systolic and diastolic heart failure ([place])  09/01/2020   Chronic kidney disease    COPD (chronic obstructive pulmonary disease) ([place])    [place] DISEASE, LUMBAR 12/16/2008   DIVERTICULOSIS, COLON 09/30/2008   DYSPNEA 07/13/2008   Graves disease    History of embolic stroke 9/50/9326   Left brain   HYPERLIPIDEMIA 03/06/2007   HYPERTENSION 03/06/2007   HYPOTHYROIDISM 10/13/2007   OSTEOARTHRITIS 03/06/2007   Personal history of colonic polyps 09/30/2008   Stroke ([place])    06/2014            TOBACCO USE, QUIT 04/12/2009   WEAKNESS 11/09/2007    Past Surgical History:  Procedure Laterality Date   CARDIAC  CATHETERIZATION     CATARACT EXTRACTION Bilateral    CHOLECYSTECTOMY     ENDARTERECTOMY Left 11/13/2015   Procedure: LEFT CAROTID ARTERY ENDARTERECTOMY;  Surgeon: Conrad Cattar[place], MD;  Location: [facility];  Service: Vascular;  Laterality: Left;   ESOPHAGOGASTRODUODENOSCOPY (EGD) WITH PROPOFOL N/A 10/11/2016   Procedure: ESOPHAGOGASTRODUODENOSCOPY (EGD) WITH PROPOFOL;  Surgeon: Mauri Pole, MD;  Location: [facility] ENDOSCOPY;  Service: Endoscopy;  Laterality: N/A;   KNEE SURGERY     PATCH ANGIOPLASTY Left 11/13/2015   Procedure: WITH 1CM X 6CM  XENOSURE BIOLOGIC PATCH ANGIOPLASTY;  Surgeon: Conrad Parcelas [place] Bor[place], MD;  Location: [facility];  Service: Vascular;  Laterality: Left;   TEE WITHOUT CARDIOVERSION N/A 06/24/2014   Procedure: TRANSESOPHAGEAL ECHOCARDIOGRAM (TEE);  Surgeon: Sanda Klein, MD;  [facility] END[facility]COPY;  Service: Cardiovascular;  Laterality: N/A;     Current Outpatient Medications  Medication Sig Dispense Refill   albuterol (PROAIR HFA) 108 (90 Base) MCG/ACT inhaler Inhale 2 puffs into the lungs every 6 (six) hours as needed for wheezing or shortness of breath. 8.5 Inhaler 5   ALPRAZolam (XANAX) 0.5 MG tablet Take 1 tablet (0.5 mg total) by mouth 2 (two) times daily. (Patient taking differently: Take 0.5 mg by mouth 3 (three) times daily.) 60 tablet 2   amLODipine (NORVASC) 5 MG tablet Take 1 tablet (5 mg total) by mouth at bedtime. 90 tablet 3   aspirin EC 81 MG EC tablet Take 1 tablet (81 mg total) by mouth daily. Swallow whole. (Patient taking differently: Take 81 mg by mouth at bedtime. Swallow whole.)     atorvastatin (LIPITOR) 80 MG tablet TAKE 1 TABLET BY MOUTH EVERY DAY AT 6PM (Patient taking differently: Take 80 mg by mouth at bedtime. TAKE 1 TABLET BY MOUTH EVERY DAY AT 6PM) 90 tablet 3   Cyanocobalamin (B-12 PO) Take 1 tablet by mouth daily.     furosemide (LASIX) 20 MG tablet Take 1 tablet (20 mg total) by mouth daily. 90 tablet 3   hydrALAZINE (APRESOLINE) 50 MG tablet Take 1 tablet (50  mg total) by mouth 3 (three) times daily. 270 tablet 3   ipratropium-albuterol (DUONEB) 0.5-2.5 (3) MG/3ML SOLN Take 3 mLs by nebulization every 6 (six) hours as needed (copd). 360 mL 11   isosorbide mononitrate (IMDUR) 30 MG 24 hr tablet Take 1 tablet (30 mg total) by mouth at bedtime. 90 tablet 3   latanoprost (XALATAN) 0.005 % ophthalmic solution Place 1 drop into both eyes at bedtime.      levothyroxine (SYNTHROID) 75 MCG tablet TAKE 1 TABLET BY MOUTH DAILY BEFORE BREAKFAST. 90 tablet 3   pantoprazole (PROTONIX) 40 MG tablet TAKE 1 TABLET BY MOUTH EVERY DAY 30 tablet 2   potassium chloride SA (KLOR-CON) 20 MEQ tablet Take 1 tablet (20 mEq total) by mouth daily. 90 tablet 3   predniSONE (  DELTASONE) 10 MG tablet Take 1 tablet (10 mg total) by mouth daily with breakfast. 90 tablet 1   TRELEGY ELLIPTA 100-62.5-25 MCG/ACT AEPB INHALE 1 PUFF BY MOUTH EVERY DAY 60 each 3   No current facility-administered medications for this visit.    Allergies:   Catapres [clonidine hcl], Clonidine, Lisinopril, Labetalol, and Labetalol hcl    Social History:  The patient  reports that she quit smoking about 43 years ago. Her smoking use included cigarettes. She started smoking about 72 years ago. She has a 43.50 pack-year smoking history. She has never used smokeless tobacco. She reports current alcohol use of about 1.0 standard drink per week. She reports that she does not use drugs.   Family History:  The patient's family history includes Stomach cancer in her maternal grandmother.    ROS:  Please see the history of present illness.   Otherwise, review of systems are positive for none.   All other systems are reviewed and negative.    PHYSICAL EXAM: VS:  BP (!) 152/60 (BP Location: Right Arm, Patient Position: Sitting, Cuff Size: Normal)    Pulse 70    Ht 5\' 6"  (1.676 m)    Wt 132 lb 9.6 oz (60.1 kg)    SpO2 94%    BMI 21.40 kg/m  , BMI Body mass index is 21.4 kg/m. GENERAL:  Well appearing HEENT:   Pupils equal round and reactive, fundi not visualized, oral mucosa unremarkable NECK:  No jugular venous distention, waveform within normal limits, carotid upstroke brisk and symmetric, no bruits, no thyromegaly LUNGS:  Clear to auscultation bilaterally HEART:  RRR.  PMI not displaced or sustained,S1 and S2 within normal limits, no S3, no S4, no clicks, no rubs, III/VI mid-peaking systolic murmur at the LUSB ABD:  Flat, positive bowel sounds normal in frequency in pitch, no bruits, no rebound, no guarding, no midline pulsatile mass, no hepatomegaly, no splenomegaly EXT:  2 plus pulses throughout, no edema, no cyanosis no clubbing SKIN: Thin.  Multiple ecchymoses. NEURO:  Cranial nerves II through XII grossly intact, motor grossly intact throughout PSYCH:  Cognitively intact, oriented to person place and time   EKG:  EKG is not ordered today.   Echo 08/31/20:  1. Left ventricular ejection fraction, by estimation, is 40 to 45%. The  left ventricle has mildly decreased function. The left ventricle has no  regional wall motion abnormalities. Left ventricular diastolic parameters  are consistent with Grade I  diastolic dysfunction (impaired relaxation).   2. Right ventricular systolic function is normal. The right ventricular  size is normal.   3. Left atrial size was mildly dilated.   4. Moderate pleural effusion.   5. The mitral valve is normal in structure. Mild mitral valve  regurgitation. No evidence of mitral stenosis.   6. Fixed right coronary cusp. The aortic valve is tricuspid. There is  moderate calcification of the aortic valve. There is moderate thickening  of the aortic valve. Aortic valve regurgitation is trivial. Moderate  aortic valve stenosis. Aortic valve mean  gradient measures 19.5 mmHg. Aortic valve Vmax measures 3.04 m/s.   7. The inferior vena cava is normal in size with greater than 50%  respiratory variability, suggesting right atrial pressure of 3 mmHg.       Recent Labs: 08/31/2020: TSH 3.587 11/29/2020: B Natriuretic Peptide 446.3 02/26/2021: ALT 17 02/27/2021: Hemoglobin 11.1; Magnesium 2.3; Platelets 165 02/28/2021: BUN 25; Creatinine, Ser 1.37; Potassium 3.9; Sodium 139    Lipid Panel  Component Value Date/Time   CHOL 159 12/25/2020 0432   CHOL 218 (H) 03/24/2019 1539   TRIG 56 12/25/2020 0432   HDL 74 12/25/2020 0432   HDL 111 03/24/2019 1539   CHOLHDL 2.1 12/25/2020 0432   VLDL 11 12/25/2020 0432   LDLCALC 74 12/25/2020 0432   LDLCALC 94 03/24/2019 1539   LDLDIRECT 141.7 05/01/2011 1037      Wt Readings from Last 3 Encounters:  07/16/21 132 lb 9.6 oz (60.1 kg)  03/15/21 130 lb 6.4 oz (59.1 kg)  03/06/21 130 lb (59 kg)      ASSESSMENT AND PLAN:  Aortic arch atherosclerosis Continue statin.  LDL was 74 on 12/2020.  Syncope and collapse No recurrent episodes.  Her episode occurred in the setting of intravascular volume depletion and being off of her usual regimen.  Continue to make sure she stays hydrated.  Chronic combined systolic and diastolic CHF (congestive heart failure) (HCC) LVEF 40 to 45%.  Presumed ischemic cardiomyopathy.  She is doing well and is managed medically.  Continue amlodipine, hydralazine, and Imdur.  She isn't on ARB/ACE-I 2/2 renal dysfunction.  No beta blocker 2/2 bradycardia.  She is euvolemic and doing well.  SVT (supraventricular tachycardia) ([place]) She had frequent runs of SVT and PACs on ambulatory monitoring.  She is asymptomatic and is also had bradycardia.  She is not on any nodal agents at this time.  Stroke CVA in 2015 and 2022.  She was on aspirin and Plavix for 3 weeks.  Continue aspirin and statin.  She has been released by physical therapy.  Aortic stenosis Moderate aortic stenosis on echo 12/2020.  She is asymptomatic and doing well.  She has been evaluated for TAVR and is not interested in any invasive procedures.  Receiving palliative services in her home.  She did have an  episode of syncope, but it does not seem that it was caused by her aortic stenosis.    Current medicines are reviewed at length with the patient today.  The patient does not have concerns regarding medicines.  The following changes have been made:  no change  Labs/ tests ordered today include:  No orders of the defined types were placed in this encounter.     Disposition:   FU with Linna Thebeau C. Oval Linsey, MD, [facility] in 6 months    Signed, Amalie Koran C. Oval Linsey, MD, [facility]  07/16/2021 10:21 AM    [place]

## 2021-07-16 NOTE — Patient Instructions (Signed)
Medication Instructions:  °Continue current medications ° °*If you need a refill on your cardiac medications before your next appointment, please call your pharmacy* ° ° °Lab Work: °None Ordered ° ° °Testing/Procedures: °None Ordered ° ° °Follow-Up: °At [facility], you and your health needs are our priority.  As part of our continuing mission to provide you with exceptional heart care, we have created designated Provider Care Teams.  These Care Teams include your primary Cardiologist (physician) and Advanced Practice Providers (APPs -  Physician Assistants and Nurse Practitioners) who all work together to provide you with the care you need, when you need it. ° °We recommend signing up for the patient portal called "MyChart".  Sign up information is provided on this After Visit Summary.  MyChart is used to connect with patients for Virtual Visits (Telemedicine).  Patients are able to view lab/test results, encounter notes, upcoming appointments, etc.  Non-urgent messages can be sent to your provider as well.   °To learn more about what you can do with MyChart, go to [email or web address].   ° °Your next appointment:   °6 month(s) ° °The format for your next appointment:   °In Person ° °Provider:   °Tiffany Malvern, MD  ° ° ° ° °

## 2021-07-16 NOTE — Assessment & Plan Note (Signed)
CVA in 2015 and 2022.  She was on aspirin and Plavix for 3 weeks.  Continue aspirin and statin.  She has been released by physical therapy.

## 2021-07-16 NOTE — Assessment & Plan Note (Signed)
She had frequent runs of SVT and PACs on ambulatory monitoring.  She is asymptomatic and is also had bradycardia.  She is not on any nodal agents at this time.

## 2021-07-17 DIAGNOSIS — H353221 Exudative age-related macular degeneration, left eye, with active choroidal neovascularization: Secondary | ICD-10-CM | POA: Diagnosis not present

## 2021-07-17 DIAGNOSIS — H26492 Other secondary cataract, left eye: Secondary | ICD-10-CM | POA: Diagnosis not present

## 2021-07-17 DIAGNOSIS — H353113 Nonexudative age-related macular degeneration, right eye, advanced atrophic without subfoveal involvement: Secondary | ICD-10-CM | POA: Diagnosis not present

## 2021-07-17 DIAGNOSIS — H35033 Hypertensive retinopathy, bilateral: Secondary | ICD-10-CM | POA: Diagnosis not present

## 2021-07-18 ENCOUNTER — Ambulatory Visit: Payer: Medicare PPO | Admitting: Physician Assistant

## 2021-07-25 ENCOUNTER — Ambulatory Visit: Payer: Medicare PPO | Admitting: Physician Assistant

## 2021-07-25 ENCOUNTER — Other Ambulatory Visit: Payer: Self-pay

## 2021-07-25 DIAGNOSIS — L570 Actinic keratosis: Secondary | ICD-10-CM

## 2021-07-25 DIAGNOSIS — L720 Epidermal cyst: Secondary | ICD-10-CM | POA: Diagnosis not present

## 2021-07-25 DIAGNOSIS — Z1283 Encounter for screening for malignant neoplasm of skin: Secondary | ICD-10-CM | POA: Diagnosis not present

## 2021-07-25 DIAGNOSIS — D485 Neoplasm of uncertain behavior of skin: Secondary | ICD-10-CM

## 2021-07-25 NOTE — Patient Instructions (Signed)

## 2021-07-31 ENCOUNTER — Ambulatory Visit (INDEPENDENT_AMBULATORY_CARE_PROVIDER_SITE_OTHER): Payer: Medicare PPO

## 2021-07-31 ENCOUNTER — Encounter: Payer: Self-pay | Admitting: Physician Assistant

## 2021-07-31 ENCOUNTER — Other Ambulatory Visit: Payer: Self-pay

## 2021-07-31 DIAGNOSIS — Z4802 Encounter for removal of sutures: Secondary | ICD-10-CM

## 2021-07-31 NOTE — Progress Notes (Signed)
Follow-Up Visit   Subjective  [place] is a 88 y.o. female who presents for the following: Annual Exam (Pt here for annual and has a few spots to be evaluated on the Left Temple, Right Shoulder and posterior neck. Some cause a little discomfort ).   The following portions of the chart were reviewed this encounter and updated as appropriate:  Tobacco   Allergies   Meds   Problems   Med Hx   Surg Hx   Fam Hx       Objective  Well appearing patient in no apparent distress; mood and affect are within normal limits.  A focused examination was performed including waist up and legs. Relevant physical exam findings are noted in the Assessment and Plan.  Left Temple Large nodule       Right Shoulder - Posterior Crusted verrucous plaque       Posterior Mid Neck Brown plaque      Assessment & Plan  Neoplasm of uncertain behavior of skin (3) Left Temple  Skin / nail biopsy Type of biopsy: tangential   Informed consent: discussed and consent obtained   Timeout: patient name, date of birth, surgical site, and procedure verified   Anesthesia: the lesion was anesthetized in a standard fashion   Anesthetic:  1% lidocaine w/ epinephrine 1-100,000 local infiltration Instrument used: flexible razor blade   Hemostasis achieved with: ferric subsulfate   Outcome: patient tolerated procedure well   Post-procedure details: wound care instructions given    Skin repair Complexity:  Simple Final length (cm):  1 Informed consent: discussed and consent obtained   Timeout: patient name, date of birth, surgical site, and procedure verified   Procedure prep:  Patient was prepped and draped in usual sterile fashion (non sterile) Prep type:  Chlorhexidine Anesthesia: the lesion was anesthetized in a standard fashion   Undermining: edges undermined   Fine/surface layer approximation (top stitches):  Suture size:  5-0 Suture type: nylon   Suture type comment:  Nylon Stitches:  simple running   Suture removal (days):  7 Hemostasis achieved with: suture, pressure and electrodesiccation Outcome: patient tolerated procedure well with no complications   Post-procedure details: wound care instructions given   Post-procedure details comment:  Pressure bandage applied Additional details:  5-0 x 2   Specimen 1 - Surgical pathology Differential Diagnosis: cyst  Check Margins: No  Right Shoulder - Posterior  Skin / nail biopsy Type of biopsy: tangential   Informed consent: discussed and consent obtained   Timeout: patient name, date of birth, surgical site, and procedure verified   Anesthesia: the lesion was anesthetized in a standard fashion   Anesthetic:  1% lidocaine w/ epinephrine 1-100,000 local infiltration Instrument used: flexible razor blade   Hemostasis achieved with: ferric subsulfate   Outcome: patient tolerated procedure well   Post-procedure details: wound care instructions given    Specimen 2 - Surgical pathology Differential Diagnosis: sk  Check Margins: No  Posterior Mid Neck  Skin / nail biopsy Type of biopsy: tangential   Informed consent: discussed and consent obtained   Timeout: patient name, date of birth, surgical site, and procedure verified   Anesthesia: the lesion was anesthetized in a standard fashion   Anesthetic:  1% lidocaine w/ epinephrine 1-100,000 local infiltration Instrument used: flexible razor blade   Hemostasis achieved with: ferric subsulfate   Outcome: patient tolerated procedure well   Post-procedure details: wound care instructions given    Specimen 3 - Surgical pathology Differential  Diagnosis: scc vs bcc  Check Margins: yes    I, Kareli Hossain, PA-C, have reviewed all documentation's for this visit.  The documentation on 07/31/21 for the exam, diagnosis, procedures and orders are all accurate and complete.

## 2021-07-31 NOTE — Progress Notes (Signed)
Pt here for suture removal x 2. Pathology to pt

## 2021-08-03 ENCOUNTER — Telehealth: Payer: Self-pay

## 2021-08-03 NOTE — Telephone Encounter (Signed)
Message left for patient. Call back information provided

## 2021-08-03 NOTE — Telephone Encounter (Signed)
Spoke with patient's daughter, Cecille Rubin. Per Cecille Rubin, patient has been doing well. Cecille Rubin to call patient to check her VM and return call.

## 2021-08-09 ENCOUNTER — Other Ambulatory Visit: Payer: Self-pay | Admitting: Adult Health

## 2021-08-10 NOTE — Telephone Encounter (Signed)
Okay for refill?    Lov 03/06/2021  Refill   60 tablet 2 11/30/2020

## 2021-08-13 ENCOUNTER — Other Ambulatory Visit: Payer: Self-pay | Admitting: Adult Health

## 2021-08-24 ENCOUNTER — Other Ambulatory Visit: Payer: Self-pay | Admitting: Pulmonary Disease

## 2021-08-24 NOTE — Telephone Encounter (Signed)
Prednisone prescription refill request received from pharmacy. Patient LOV was 09/12/20. Called and spoke with patient daughter Cecille Rubin ([place]).  Patient is only taking Prednisone as needed daily.  Cecille Rubin asked if patient could do a my cart video visit with patient for pulmonary follow up to make it easier on patient.  Cecille Rubin stated she will be present at video visit with patient to review any issues. Patient scheduled 09/04/21 at 1130 with Dr. Vaughan Browner. Prednisone refill sent to [facility]. Nothing further this time.

## 2021-08-24 NOTE — Telephone Encounter (Signed)
Please advise patient requesting refills

## 2021-09-03 ENCOUNTER — Other Ambulatory Visit: Payer: Medicare PPO

## 2021-09-03 ENCOUNTER — Other Ambulatory Visit: Payer: Self-pay

## 2021-09-03 DIAGNOSIS — Z515 Encounter for palliative care: Secondary | ICD-10-CM

## 2021-09-03 NOTE — Progress Notes (Signed)
PATIENT NAME: [name] DOB: Aug 28, 1924 MRN: [id number]  PRIMARY CARE PROVIDER: [name], NP  RESPONSIBLE PARTY:  Acct ID - Guarantor Home Phone Work Phone Relationship Acct Type  [id number] [name], [name]  Self P/F     [address], [place], [place] [address]    COMMUNITY PALLIATIVE CARE RN NOTE     PLAN OF CARE and INTERVENTION:  ADVANCE CARE PLANNING/GOALS OF CARE: to remain in her home as possible. CAREGIVER EDUCATION: Safety, Symptom Management. DISEASE STATUS: RN Palliative care telephonic encounter completed today with patient and daughter. Spoke with both patient and her daughter. Patient continues to live independently in her home. She is ambulatory with assistance of her walker. She denies any recent falls. She reports some pain due to arthritis but declines needing anything for pain. No worsening shortness of breath. COPD managed with current med regime. She denies any recent weight gain/fluid retention. Meal intake remains good. Daughter shared that she prepares patient meals and patient can heat it up in the microwave. No new concerns verbalized at this time. Patient declined need for an in person visit and is aware to call Palliative care with any questions or concerns. HISTORY OF PRESENT ILLNESS: This is a 88- year-old female with a diagnosis of COPD, CHF, Aortic Stenosis, CKD, and Osteoarthritis. Palliative care has been asked to follow for additional support, care and complex decision making.   CODE STATUS: DNR PPS: 60% Physical Exam: deferred.        Duration: 30 min     Cornelius Moras, RN

## 2021-09-04 ENCOUNTER — Telehealth: Payer: Self-pay | Admitting: Pulmonary Disease

## 2021-09-04 ENCOUNTER — Telehealth: Payer: Medicare PPO | Admitting: Pulmonary Disease

## 2021-09-04 NOTE — Telephone Encounter (Signed)
Pt scheduled with PM on 3/15 @ 1045 for overdue f/u on COPD. Nothing further needed.

## 2021-09-10 DIAGNOSIS — H353221 Exudative age-related macular degeneration, left eye, with active choroidal neovascularization: Secondary | ICD-10-CM | POA: Diagnosis not present

## 2021-09-10 DIAGNOSIS — H401131 Primary open-angle glaucoma, bilateral, mild stage: Secondary | ICD-10-CM | POA: Diagnosis not present

## 2021-09-10 DIAGNOSIS — H532 Diplopia: Secondary | ICD-10-CM | POA: Diagnosis not present

## 2021-09-10 DIAGNOSIS — H43813 Vitreous degeneration, bilateral: Secondary | ICD-10-CM | POA: Diagnosis not present

## 2021-09-11 DIAGNOSIS — H353221 Exudative age-related macular degeneration, left eye, with active choroidal neovascularization: Secondary | ICD-10-CM | POA: Diagnosis not present

## 2021-09-17 ENCOUNTER — Other Ambulatory Visit: Payer: Self-pay | Admitting: Pulmonary Disease

## 2021-09-17 DIAGNOSIS — H353221 Exudative age-related macular degeneration, left eye, with active choroidal neovascularization: Secondary | ICD-10-CM | POA: Diagnosis not present

## 2021-09-17 DIAGNOSIS — H532 Diplopia: Secondary | ICD-10-CM | POA: Diagnosis not present

## 2021-09-17 DIAGNOSIS — H02402 Unspecified ptosis of left eyelid: Secondary | ICD-10-CM | POA: Diagnosis not present

## 2021-09-19 ENCOUNTER — Ambulatory Visit: Payer: Medicare PPO | Admitting: Pulmonary Disease

## 2021-09-19 ENCOUNTER — Encounter: Payer: Self-pay | Admitting: Pulmonary Disease

## 2021-09-19 ENCOUNTER — Other Ambulatory Visit: Payer: Self-pay

## 2021-09-19 VITALS — BP 138/74 | HR 71 | Temp 97.7°F | Ht 66.0 in | Wt 134.2 lb

## 2021-09-19 DIAGNOSIS — J449 Chronic obstructive pulmonary disease, unspecified: Secondary | ICD-10-CM

## 2021-09-19 MED ORDER — TRELEGY ELLIPTA 100-62.5-25 MCG/ACT IN AEPB: 1.0000 | INHALATION_SPRAY | Freq: Every day | RESPIRATORY_TRACT | 4 refills | Status: AC

## 2021-09-19 MED ORDER — ALBUTEROL SULFATE HFA 108 (90 BASE) MCG/ACT IN AERS: 2.0000 | INHALATION_SPRAY | Freq: Four times a day (QID) | RESPIRATORY_TRACT | 3 refills | Status: DC | PRN

## 2021-09-19 NOTE — Patient Instructions (Signed)
I am glad you are stable with your breathing ?Continue inhalers, prednisone at 5 mg ?Follow-up in 1 year.  Video visit okay ?

## 2021-09-19 NOTE — Progress Notes (Addendum)
[name]    [id number]    [birth year]  Primary Care Physician:Nafziger, Tommi Rumps, NP  Referring Physician: [name], NP [place],  [place] [address]  Chief complaint:  Follow up for  COPD Gold D Vocal cord paralysis  HPI: [name] is a 88 y.o. with complaints of dyspnea on exertion. The problem started in May 2017 after a carotid artery end arterectomy. This procedure was complicated by left vocal cord paralysis, hoarseness.  Also has COPD Gold D, mMRC 3 with multiple exacerbations.  Multiple exacerbations and hospitalizations over the past few years for COPD. She was hospitalized in the emergency room in October 2017 for COPD exacerbation. She was discharged on a prednisone taper and amoxicillin. Records from this hospitalization reviewed below in data section  Hospitalized in April 2018 with severe anemia, bleeding AVMs.  Underwent EGD with cauterization, clipping of bleeding lesion. Hospitalized for 2 days in July 2019 for mild COPD exacerbation. In 2019 we attempted to try her on Daliresp to get her off the prednisone but she did not tolerate it with increasing dyspnea Seen in the ED on February 2020 with wheezing, dyspnea improved with 1 dose of Solu-Medrol and nebs  She was hospitalized in February 2022 for acute respiratory failure due to CHF exacerbation, no evidence of COPD exacerbation.  She improved with diuresis and was seen by cardiology  Interim History: Remains on Trelegy inhaler, nebs as needed and chronic prednisone. Breathing is stable with no issues  Outpatient Encounter Medications as of 09/19/2021  Medication Sig   albuterol (PROAIR HFA) 108 (90 Base) MCG/ACT inhaler Inhale 2 puffs into the lungs every 6 (six) hours as needed for wheezing or shortness of breath.   amLODipine (NORVASC) 5 MG tablet Take 1 tablet (5 mg total) by mouth at bedtime.   aspirin EC 81 MG EC tablet Take 1 tablet (81 mg total) by mouth daily.  Swallow whole. (Patient taking differently: Take 81 mg by mouth at bedtime. Swallow whole.)   atorvastatin (LIPITOR) 80 MG tablet TAKE 1 TABLET BY MOUTH EVERY DAY AT 6PM (Patient taking differently: Take 80 mg by mouth at bedtime. TAKE 1 TABLET BY MOUTH EVERY DAY AT 6PM)   Cyanocobalamin (B-12 PO) Take 1 tablet by mouth daily.   Fluticasone-Umeclidin-Vilant (TRELEGY ELLIPTA) 100-62.5-25 MCG/ACT AEPB INHALE 1 PUFF BY MOUTH EVERY DAY   furosemide (LASIX) 20 MG tablet Take 1 tablet (20 mg total) by mouth daily.   hydrALAZINE (APRESOLINE) 50 MG tablet Take 1 tablet (50 mg total) by mouth 3 (three) times daily.   ipratropium-albuterol (DUONEB) 0.5-2.5 (3) MG/3ML SOLN Take 3 mLs by nebulization every 6 (six) hours as needed (copd).   isosorbide mononitrate (IMDUR) 30 MG 24 hr tablet Take 1 tablet (30 mg total) by mouth at bedtime.   latanoprost (XALATAN) 0.005 % ophthalmic solution Place 1 drop into both eyes at bedtime.    levothyroxine (SYNTHROID) 75 MCG tablet TAKE 1 TABLET BY MOUTH DAILY BEFORE BREAKFAST.   pantoprazole (PROTONIX) 40 MG tablet TAKE 1 TABLET BY MOUTH EVERY DAY   potassium chloride SA (KLOR-CON) 20 MEQ tablet Take 1 tablet (20 mEq total) by mouth daily.   predniSONE (DELTASONE) 10 MG tablet TAKE 1 TABLET BY MOUTH EVERY DAY WITH BREAKFAST (Patient taking differently: '5mg'$  daily)   No facility-administered encounter medications on file as of 09/19/2021.   Physical Exam: Blood pressure 138/74, pulse 71, temperature 97.7 F (36.5 C), temperature source Oral, height  $'5\' 6"'f$  (1.676 m), weight 134 lb 3.2 oz (60.9 kg), SpO2 95 %. Gen:      No acute distress HEENT:  EOMI, sclera anicteric Neck:     No masses; no thyromegaly Lungs:    Clear to auscultation bilaterally; normal respiratory effort CV:         Regular rate and rhythm; no murmurs Abd:      + bowel sounds; soft, non-tender; no palpable masses, no distension Ext:    No edema; adequate peripheral perfusion Skin:      Warm and dry;  no rash Neuro: alert and oriented x 3 Psych: normal mood and affect   Data Reviewed: Imnaging CT scan chest 11/26/12- Right lingular opacity, scattered sub cm pulmonary nodules, images reviewed.  CT 04/05/16- Stable pulmonary nodules Severe COPD. Chest x-ray 01/12/2018- hyperinflation, no active pulmonary disease.  Stable 7 mm opacity in the left midlung. Chest x-ray 08/17/2018- hyperinflation, chronic nodular density over left lung. CTA 08/30/2020-negative for PE, emphysema, small left effusion with atelectasis, cardiomegaly. Chest x-ray 02/27/2021-small left effusion, no acute cardiopulmonary abnormality Reviewed the images personally.  PFTs 04/02/16 FVC 2.54 (107%), FEV1 1.43 (82%), F/F 56, TLC 107%, DLCO 42% Mod obstructive lung disease with DLCO impairment.  No bronchodilator response  Assessment: Moderate COPD. Although she has moderate obstruction on PFTs her COPD is likely severe based on lung imaging and DLCO impairment.  Currently on Trelegy inhaler, nebs as needed  She has not tolerated coming off prednisone and frequency of exacerbations have decreased with a daily pednisone.  Has side effects of skin bruising with higher doses of prednisone Has not tolerated Daliresp.  Looks like she will need to be on prednisone indefinitely which she takes between 2.5 to 10 mg/day based on symptoms  Goals of care Currently followed by palliative care.  She is DNR per palliative notes  Health maintenance Up-to-date with flu and Pneumovax and Covid vaccine    Plan/Recommendations: - Continue Trelegy, duo nebs as needed - Continue prednisone - Use incentive spirometer and flutter valve 3 times/ day.   Gloria Garfinkel MD [facility] 09/19/2021, 11:05 AM  CC: Gloria Peng, NP

## 2021-10-11 DIAGNOSIS — H9113 Presbycusis, bilateral: Secondary | ICD-10-CM | POA: Diagnosis not present

## 2021-10-19 ENCOUNTER — Ambulatory Visit: Payer: Medicare PPO | Admitting: Physician Assistant

## 2021-10-30 ENCOUNTER — Other Ambulatory Visit: Payer: Self-pay | Admitting: Adult Health

## 2021-11-02 DIAGNOSIS — H353113 Nonexudative age-related macular degeneration, right eye, advanced atrophic without subfoveal involvement: Secondary | ICD-10-CM | POA: Diagnosis not present

## 2021-11-02 DIAGNOSIS — H353221 Exudative age-related macular degeneration, left eye, with active choroidal neovascularization: Secondary | ICD-10-CM | POA: Diagnosis not present

## 2021-11-09 ENCOUNTER — Other Ambulatory Visit: Payer: Medicare PPO

## 2021-11-09 DIAGNOSIS — Z515 Encounter for palliative care: Secondary | ICD-10-CM

## 2021-11-09 NOTE — Progress Notes (Signed)
COMMUNITY PALLIATIVE CARE SW NOTE ? ?PATIENT NAME: [name] ?DOB: 1925-02-22 ?MRN: [id number] ? ?PRIMARY CARE PROVIDER: [name], NP ? ?RESPONSIBLE PARTY:  ?Acct ID - Guarantor Home Phone Work Phone Relationship Acct Type  ?[id number] [name]  Self P/F  ?   [place], [place], [place] [address]  ? ?SOCIAL WORK TELEPHONIC ENCOUNTER ? ?PC SW telephonic encounter with patient to assess her needs, comfort and overall status. Patient reported that she has been  holding her own. She has not had any falls. She continues to ambulate with her walker. Patient denied any pain issues. She is eating well. She continues to live alone, but with daily contact with her daughters. She continues to play bridge, and get her hair and nails done regularly. She continues to have a caregiver that comes in to assist her with personal care need, run errands and housekeeping. SW assessed her desire for a NP or SW visit. Patient declined noting that she was fine. She was receptive to follow-up in 6-8 weeks. ?No other concerns were noted.  ? ?Katheren Puller, LCSW ? ?

## 2021-11-23 ENCOUNTER — Other Ambulatory Visit: Payer: Self-pay | Admitting: Cardiology

## 2021-11-26 NOTE — Telephone Encounter (Signed)
Rx(s) sent to pharmacy electronically.  

## 2021-12-10 ENCOUNTER — Telehealth: Payer: Self-pay

## 2021-12-10 NOTE — Telephone Encounter (Signed)
Volunteer check in call made for palliative care, no answer 

## 2021-12-17 ENCOUNTER — Other Ambulatory Visit: Payer: Self-pay | Admitting: Adult Health

## 2021-12-31 DIAGNOSIS — H353114 Nonexudative age-related macular degeneration, right eye, advanced atrophic with subfoveal involvement: Secondary | ICD-10-CM | POA: Diagnosis not present

## 2021-12-31 DIAGNOSIS — H43812 Vitreous degeneration, left eye: Secondary | ICD-10-CM | POA: Diagnosis not present

## 2021-12-31 DIAGNOSIS — H353221 Exudative age-related macular degeneration, left eye, with active choroidal neovascularization: Secondary | ICD-10-CM | POA: Diagnosis not present

## 2021-12-31 DIAGNOSIS — H35372 Puckering of macula, left eye: Secondary | ICD-10-CM | POA: Diagnosis not present

## 2022-01-01 DIAGNOSIS — H43812 Vitreous degeneration, left eye: Secondary | ICD-10-CM | POA: Diagnosis not present

## 2022-01-01 DIAGNOSIS — H353114 Nonexudative age-related macular degeneration, right eye, advanced atrophic with subfoveal involvement: Secondary | ICD-10-CM | POA: Diagnosis not present

## 2022-01-01 DIAGNOSIS — S0502XA Injury of conjunctiva and corneal abrasion without foreign body, left eye, initial encounter: Secondary | ICD-10-CM | POA: Diagnosis not present

## 2022-01-01 DIAGNOSIS — H353221 Exudative age-related macular degeneration, left eye, with active choroidal neovascularization: Secondary | ICD-10-CM | POA: Diagnosis not present

## 2022-01-14 ENCOUNTER — Telehealth: Payer: Self-pay | Admitting: Pulmonary Disease

## 2022-01-14 NOTE — Telephone Encounter (Signed)
ATC daughter [name], left message letting her know that I am going to send a message to Dr. Vaughan Browner and once we hear back we will call them.   Pt needs refill of duoned. Pharmacy denied it as prescription is expired. This med was prescribed by doc in hospital. Pharmacy is [facility] on [address].   Please advise

## 2022-01-15 MED ORDER — IPRATROPIUM-ALBUTEROL 0.5-2.5 (3) MG/3ML IN SOLN: 3.0000 mL | Freq: Four times a day (QID) | RESPIRATORY_TRACT | 11 refills | Status: DC | PRN

## 2022-01-15 NOTE — Telephone Encounter (Signed)
Okay to send prescription refill for DuoNebs every 6 hours as needed

## 2022-01-15 NOTE — Telephone Encounter (Signed)
Rx for pt's duoneb sol has been sent to preferred pharmacy for pt. Called and spoke with pt's daughter Margarita Grizzle letting her know this had been done and she verbalized understanding. Nothing further needed.

## 2022-01-22 ENCOUNTER — Ambulatory Visit: Payer: Medicare PPO | Admitting: Physician Assistant

## 2022-01-23 ENCOUNTER — Telehealth: Payer: Self-pay | Admitting: Pulmonary Disease

## 2022-01-23 NOTE — Telephone Encounter (Signed)
[name], patients daughter is calling and requesting if her mother can take 2.'5mg'$  of prednisone and not the whole dose due to her mother bruising really badly.  Please advise sir

## 2022-01-24 NOTE — Telephone Encounter (Signed)
Yes. It is ok to take prednisone 2.5 mg daily

## 2022-01-24 NOTE — Telephone Encounter (Signed)
Called and spoke with [name]. She verbalized understanding. Nothing further needed.

## 2022-01-27 ENCOUNTER — Other Ambulatory Visit: Payer: Self-pay | Admitting: Adult Health

## 2022-02-07 ENCOUNTER — Ambulatory Visit: Payer: Medicare PPO | Admitting: Adult Health

## 2022-02-07 ENCOUNTER — Encounter: Payer: Self-pay | Admitting: Adult Health

## 2022-02-07 VITALS — BP 120/60 | HR 80 | Temp 97.6°F | Ht 66.0 in | Wt 131.0 lb

## 2022-02-07 DIAGNOSIS — I5042 Chronic combined systolic (congestive) and diastolic (congestive) heart failure: Secondary | ICD-10-CM

## 2022-02-07 DIAGNOSIS — J42 Unspecified chronic bronchitis: Secondary | ICD-10-CM

## 2022-02-07 DIAGNOSIS — E039 Hypothyroidism, unspecified: Secondary | ICD-10-CM

## 2022-02-07 DIAGNOSIS — N1831 Chronic kidney disease, stage 3a: Secondary | ICD-10-CM

## 2022-02-07 LAB — CBC WITH DIFFERENTIAL/PLATELET
Basophils Absolute: 0 10*3/uL (ref 0.0–0.1)
Basophils Relative: 0.3 % (ref 0.0–3.0)
Eosinophils Absolute: 0 10*3/uL (ref 0.0–0.7)
Eosinophils Relative: 0.5 % (ref 0.0–5.0)
HCT: 33.4 % — ABNORMAL LOW (ref 36.0–46.0)
Hemoglobin: 10.8 g/dL — ABNORMAL LOW (ref 12.0–15.0)
Lymphocytes Relative: 6.4 % — ABNORMAL LOW (ref 12.0–46.0)
Lymphs Abs: 0.5 10*3/uL — ABNORMAL LOW (ref 0.7–4.0)
MCHC: 32.3 g/dL (ref 30.0–36.0)
MCV: 83.6 fl (ref 78.0–100.0)
Monocytes Absolute: 0.3 10*3/uL (ref 0.1–1.0)
Monocytes Relative: 3.7 % (ref 3.0–12.0)
Neutro Abs: 6.5 10*3/uL (ref 1.4–7.7)
Neutrophils Relative %: 89.1 % — ABNORMAL HIGH (ref 43.0–77.0)
Platelets: 199 10*3/uL (ref 150.0–400.0)
RBC: 3.99 Mil/uL (ref 3.87–5.11)
RDW: 17 % — ABNORMAL HIGH (ref 11.5–15.5)
WBC: 7.3 10*3/uL (ref 4.0–10.5)

## 2022-02-07 LAB — COMPREHENSIVE METABOLIC PANEL
ALT: 13 U/L (ref 0–35)
AST: 16 U/L (ref 0–37)
Albumin: 4 g/dL (ref 3.5–5.2)
Alkaline Phosphatase: 87 U/L (ref 39–117)
BUN: 24 mg/dL — ABNORMAL HIGH (ref 6–23)
CO2: 28 mEq/L (ref 19–32)
Calcium: 9 mg/dL (ref 8.4–10.5)
Chloride: 102 mEq/L (ref 96–112)
Creatinine, Ser: 1.21 mg/dL — ABNORMAL HIGH (ref 0.40–1.20)
GFR: 38.33 mL/min — ABNORMAL LOW (ref >60.00)
Glucose, Bld: 112 mg/dL — ABNORMAL HIGH (ref 70–99)
Potassium: 4 mEq/L (ref 3.5–5.1)
Sodium: 138 mEq/L (ref 135–145)
Total Bilirubin: 0.6 mg/dL (ref 0.2–1.2)
Total Protein: 6.8 g/dL (ref 6.0–8.3)

## 2022-02-07 LAB — TSH: TSH: 1.06 u[IU]/mL (ref 0.35–5.50)

## 2022-02-07 NOTE — Progress Notes (Signed)
Subjective:    Patient ID: CHRISTYANN MANOLIS, female    DOB: Dec 04, 1924, 88 y.o.   MRN: [id number]  HPI  88 year old female who  has a past medical history of Anxiety, Aortic arch atherosclerosis ([place]) (06/24/2014), Atherosclerotic ulcer of aorta ([place]) (06/24/2014), Carotid artery occlusion, Chronic combined systolic and diastolic heart failure ([place]) (09/01/2020), Chronic kidney disease, COPD (chronic obstructive pulmonary disease) ([place]), DISC DISEASE, LUMBAR (12/16/2008), DIVERTICULOSIS, COLON (09/30/2008), DYSPNEA (07/13/2008), Graves disease, History of embolic stroke (8/36/6294), HYPERLIPIDEMIA (03/06/2007), HYPERTENSION (03/06/2007), HYPOTHYROIDISM (10/13/2007), OSTEOARTHRITIS (03/06/2007), Personal history of colonic polyps (09/30/2008), Stroke ([place]), TOBACCO USE, QUIT (04/12/2009), and WEAKNESS (11/09/2007).  She presents to the office today with her daughter.  Her daughter has 2 home health care aides that come to her house throughout the week to help [place] bathe, dress, and fix meals.  She needs a new FL 2 done to get recertified for long term care help d/t history of COPD, CHF, chronic kidney disease stage III.  Overall she reports that she has been doing well, she has not had any hospital admissions for roughly 1 year.  We also need to recheck her TSH since it has not been checked since February 2022.  She is currently managed with Synthroid 75 mcg daily  Lab Results  Component Value Date   TSH 3.587 08/31/2020     Review of Systems See HPI   Past Medical History:  Diagnosis Date   Anxiety    Aortic arch atherosclerosis ([place]) 06/24/2014   Atherosclerotic ulcer of aorta ([place]) 06/24/2014   Carotid artery occlusion    Chronic combined systolic and diastolic heart failure ([place]) 09/01/2020   Chronic kidney disease    COPD (chronic obstructive pulmonary disease) ([place])    DISC DISEASE, LUMBAR 12/16/2008   DIVERTICULOSIS, COLON 09/30/2008   DYSPNEA 07/13/2008   Graves disease    History of embolic  stroke 7/65/4650   Left brain   HYPERLIPIDEMIA 03/06/2007   HYPERTENSION 03/06/2007   HYPOTHYROIDISM 10/13/2007   OSTEOARTHRITIS 03/06/2007   Personal history of colonic polyps 09/30/2008   Stroke ([place])    06/2014            TOBACCO USE, QUIT 04/12/2009   WEAKNESS 11/09/2007    Social History   Socioeconomic History   Marital status: Divorced    Spouse name: Not on file   Number of children: 4   Years of education: college   Highest education level: Not on file  Occupational History   Occupation: retired  Tobacco Use   Smoking status: Former    Packs/day: 1.50    Years: 29.00    Total pack years: 43.50    Types: Cigarettes    Start date: 07/08/1949    Quit date: 07/08/1978    Years since quitting: 43.6   Smokeless tobacco: Never  Vaping Use   Vaping Use: Never used  Substance and Sexual Activity   Alcohol use: Yes    Alcohol/week: 1.0 standard drink of alcohol    Types: 1 Glasses of wine per week    Comment: "red wine"- some nights   Drug use: No   Sexual activity: Not Currently  Other Topics Concern   Not on file  Social History Narrative      Patient is right handed.   Patient drinks 1 cup caffeine daily.   Social Determinants of Health   Financial Resource Strain: Not on file  Food Insecurity: Not on file  Transportation Needs: Not on file  Physical Activity:  Not on file  Stress: Not on file  Social Connections: Not on file  Intimate Partner Violence: Not on file    Past Surgical History:  Procedure Laterality Date   CARDIAC CATHETERIZATION     CATARACT EXTRACTION Bilateral    CHOLECYSTECTOMY     ENDARTERECTOMY Left 11/13/2015   Procedure: LEFT CAROTID ARTERY ENDARTERECTOMY;  Surgeon: Conrad Shiloh, MD;  Location: [facility];  Service: Vascular;  Laterality: Left;   ESOPHAGOGASTRODUODENOSCOPY (EGD) WITH PROPOFOL N/A 10/11/2016   Procedure: ESOPHAGOGASTRODUODENOSCOPY (EGD) WITH PROPOFOL;  Surgeon: Mauri Pole, MD;  Location: [facility] ENDOSCOPY;  Service: Endoscopy;   Laterality: N/A;   KNEE SURGERY     PATCH ANGIOPLASTY Left 11/13/2015   Procedure: WITH 1CM X 6CM  XENOSURE BIOLOGIC PATCH ANGIOPLASTY;  Surgeon: Conrad Pinebluff, MD;  Location: [facility];  Service: Vascular;  Laterality: Left;   TEE WITHOUT CARDIOVERSION N/A 06/24/2014   Procedure: TRANSESOPHAGEAL ECHOCARDIOGRAM (TEE);  Surgeon: Sanda Klein, MD;  Location[facility] ENDOSCOPY;  Service: Cardiovascular;  Laterality: N/A;    Family History  Problem Relation Age of Onset   Stomach cancer Maternal Grandmother    Colon cancer Neg Hx     Allergies  Allergen Reactions   Catapres [Clonidine Hcl] Other (See Comments)    Made pt feel horrible, shaky, weak and nausea   Clonidine Other (See Comments)    Made pt feel horrible, shaky, weak and nausea   Lisinopril Anaphylaxis    Tongue swelling   Labetalol Other (See Comments)    Caused bradycardia and syncope   Labetalol Hcl Other (See Comments)    Caused bradycardia and syncope    Current Outpatient Medications on File Prior to Visit  Medication Sig Dispense Refill   albuterol (PROAIR HFA) 108 (90 Base) MCG/ACT inhaler Inhale 2 puffs into the lungs every 6 (six) hours as needed for wheezing or shortness of breath. 25.5 g 3   ALPRAZolam (XANAX) 0.5 MG tablet TAKE 1 TABLET BY MOUTH 3 TIMES DAILY. 90 tablet 1   amLODipine (NORVASC) 5 MG tablet TAKE 1 TABLET (5 MG TOTAL) BY MOUTH DAILY. 90 tablet 1   aspirin EC 81 MG EC tablet Take 1 tablet (81 mg total) by mouth daily. Swallow whole. (Patient taking differently: Take 81 mg by mouth at bedtime. Swallow whole.)     atorvastatin (LIPITOR) 80 MG tablet TAKE 1 TABLET BY MOUTH EVERY DAY AT 6PM (Patient taking differently: Take 80 mg by mouth at bedtime. TAKE 1 TABLET BY MOUTH EVERY DAY AT 6PM) 90 tablet 3   Cyanocobalamin (B-12 PO) Take 1 tablet by mouth daily.     Fluticasone-Umeclidin-Vilant (TRELEGY ELLIPTA) 100-62.5-25 MCG/ACT AEPB Inhale 1 puff into the lungs daily. 120 each 4   furosemide (LASIX) 20 MG  tablet TAKE 1 TABLET BY MOUTH EVERY DAY 90 tablet 1   hydrALAZINE (APRESOLINE) 50 MG tablet TAKE 1 TABLET BY MOUTH THREE TIMES A DAY 270 tablet 1   ipratropium-albuterol (DUONEB) 0.5-2.5 (3) MG/3ML SOLN Take 3 mLs by nebulization every 6 (six) hours as needed (copd). 360 mL 11   isosorbide mononitrate (IMDUR) 30 MG 24 hr tablet TAKE 1 TABLET BY MOUTH EVERY DAY 90 tablet 1   KLOR-CON M20 20 MEQ tablet TAKE 1 TABLET BY MOUTH EVERY DAY 90 tablet 1   latanoprost (XALATAN) 0.005 % ophthalmic solution Place 1 drop into both eyes at bedtime.      levothyroxine (SYNTHROID) 75 MCG tablet TAKE 1 TABLET BY MOUTH DAILY BEFORE BREAKFAST. 90 tablet 3  pantoprazole (PROTONIX) 40 MG tablet Take 1 tablet (40 mg total) by mouth daily. SCHEDULE APPT FOR FUTURE REFILLS 90 tablet 0   predniSONE (DELTASONE) 10 MG tablet TAKE 1 TABLET BY MOUTH EVERY DAY WITH BREAKFAST (Patient taking differently: '5mg'$  daily) 90 tablet 1   No current facility-administered medications on file prior to visit.    BP 120/60   Pulse 80   Temp 97.6 F (36.4 C) (Oral)   Ht '5\' 6"'$  (1.676 m)   Wt 131 lb (59.4 kg)   SpO2 94%   BMI 21.14 kg/m       Objective:   Physical Exam Vitals and nursing note reviewed.  Constitutional:      Appearance: She is ill-appearing (chronicall).  Cardiovascular:     Rate and Rhythm: Normal rate and regular rhythm.     Heart sounds: Murmur heard.  Pulmonary:     Effort: Pulmonary effort is normal.     Breath sounds: Normal breath sounds.  Musculoskeletal:        General: Normal range of motion.  Skin:    General: Skin is warm and dry.  Neurological:     General: No focal deficit present.     Mental Status: She is alert and oriented to person, place, and time.     Gait: Gait abnormal.  Psychiatric:        Mood and Affect: Mood normal.        Behavior: Behavior normal.        Thought Content: Thought content normal.       Assessment & Plan:  1. Hypothyroidism, unspecified type - Consider  dose increase - TSH; Future - CBC with Differential/Platelet; Future - Comprehensive metabolic panel; Future - Comprehensive metabolic panel - CBC with Differential/Platelet - TSH  2. Stage 3a chronic kidney disease (HCC) - FL2 filled out and given to daughter  3. Chronic combined systolic and diastolic heart failure (HCC) - FL2 filled out and given to daughter  4. Chronic bronchitis, unspecified chronic bronchitis type ([place]) - FL2 filled out and given to daughter  [name], NP

## 2022-02-10 ENCOUNTER — Encounter: Payer: Self-pay | Admitting: Pulmonary Disease

## 2022-02-11 MED ORDER — PREDNISONE 5 MG PO TABS: 5.0000 mg | ORAL_TABLET | Freq: Every day | ORAL | 1 refills | Status: DC

## 2022-02-21 ENCOUNTER — Other Ambulatory Visit: Payer: Self-pay | Admitting: *Deleted

## 2022-02-21 NOTE — Patient Outreach (Signed)
  Care Coordination   Initial Visit Note   02/21/2022 Name: [name] MRN: [id number] DOB: Sep 28, 1924  [name] is a 88 y.o. year old female who sees Nafziger, Tommi Rumps, NP for primary care. I spoke with  [name] by phone today  What matters to the patients health and wellness today?  LTC placement assistance    Goals Addressed               This Visit's Progress     LTC placement (pt-stated)         Care Coordination Interventions: Reviewed scheduled/upcoming provider appointments including pending appointments and requested caregiver to contact primary and request AWV for this year. Social Work referral for LTC placement assistance. Provider has giver caregiver an FL2 however not completed           Caregiver current at a provider office and call ended. Referral to social worker completed to continue with assistance. SDOH assessments and interventions completed:  No     Care Coordination Interventions Activated:  Yes  Care Coordination Interventions:  Yes, provided   Follow up plan: No further intervention required.   Encounter Outcome:  Pt. Visit Completed   Raina Mina, RN Care Management Coordinator [place] Office [phone]

## 2022-02-21 NOTE — Patient Instructions (Signed)
Visit Information  Thank you for taking time to visit with me today. Please don't hesitate to contact me if I can be of assistance to you.   Following are the goals we discussed today:   Goals Addressed               This Visit's Progress     LTC placement (pt-stated)         Care Coordination Interventions: Reviewed scheduled/upcoming provider appointments including pending appointments and requested caregiver to contact primary and request AWV for this year. Social Work referral for LTC placement assistance. Provider has giver caregiver an FL2 however not completed             Please call the care guide team at [phone] if you need to cancel or reschedule your appointment.   If you are experiencing a Mental Health or [place] or need someone to talk to, please call the Suicide and Crisis Lifeline: 988  Patient verbalizes understanding of instructions and care plan provided today and agrees to view in [place]. Active MyChart status and patient understanding of how to access instructions and care plan via MyChart confirmed with patient.     No further follow up required: No further assistance presented at this time  Raina Mina, RN Care Management Coordinator [place] Office [phone]

## 2022-02-22 ENCOUNTER — Other Ambulatory Visit: Payer: Self-pay | Admitting: Cardiology

## 2022-02-22 NOTE — Telephone Encounter (Signed)
This is Dr. Louviers's pt.  °

## 2022-02-25 DIAGNOSIS — H353114 Nonexudative age-related macular degeneration, right eye, advanced atrophic with subfoveal involvement: Secondary | ICD-10-CM | POA: Diagnosis not present

## 2022-02-25 DIAGNOSIS — H353221 Exudative age-related macular degeneration, left eye, with active choroidal neovascularization: Secondary | ICD-10-CM | POA: Diagnosis not present

## 2022-02-25 DIAGNOSIS — H43813 Vitreous degeneration, bilateral: Secondary | ICD-10-CM | POA: Diagnosis not present

## 2022-03-05 ENCOUNTER — Ambulatory Visit: Payer: Medicare PPO | Admitting: Physician Assistant

## 2022-03-07 ENCOUNTER — Telehealth: Payer: Self-pay | Admitting: Adult Health

## 2022-03-07 ENCOUNTER — Encounter: Payer: Self-pay | Admitting: Adult Health

## 2022-03-07 NOTE — Telephone Encounter (Signed)
PPW given to provider

## 2022-03-07 NOTE — Telephone Encounter (Signed)
Paperwork was dropped off before closing that needs to be signed by primary care provider. Paperwork was given to cma to be placed in provider's folder.            Please advise

## 2022-03-08 ENCOUNTER — Ambulatory Visit: Payer: Self-pay

## 2022-03-08 NOTE — Patient Outreach (Signed)
  Care Coordination   Follow Up Visit Note   03/08/2022 Name: MICHELL GIULIANO MRN: [id number] DOB: Mar 13, 1925  Shandelle GLADYSE CORVIN is a 88 y.o. year old female who sees Nafziger, Tommi Rumps, NP for primary care. I  spoke with patients daughter [name] by phone.  What matters to the patients health and wellness today?  Move into long term care    Goals Addressed               This Visit's Progress     Patient Stated     COMPLETED: LTC placement (pt-stated)         Care Coordination Interventions: Determined patient will move into [name] FL2 completed by provider and daughter [name] will pick up on 03/08/22 Encouraged [name] to contact SW as needed            SDOH assessments and interventions completed:  No     Care Coordination Interventions Activated:  Yes  Care Coordination Interventions:  Yes, provided   Follow up plan: No further intervention required.   Encounter Outcome:  Pt. Visit Completed   Daneen Schick, BSW, CDP Social Worker, Certified Dementia Practitioner Care Coordination [phone]

## 2022-03-08 NOTE — Patient Instructions (Signed)
Visit Information  Thank you for taking time to visit with me today. Please don't hesitate to contact me if I can be of assistance to you.   Following are the goals we discussed today:   Goals Addressed               This Visit's Progress     Patient Stated     COMPLETED: LTC placement (pt-stated)         Care Coordination Interventions: Determined patient will move into [name] FL2 completed by provider and daughter [name] will pick up on 03/08/22 Encouraged [name] to contact SW as needed            If you are experiencing a Mental Health or [place] or need someone to talk to, please go to [facility] [address] [place] [phone])  Patient verbalizes understanding of instructions and care plan provided today and agrees to view in [place]. Active MyChart status and patient understanding of how to access instructions and care plan via MyChart confirmed with patient.     No further follow up required: Please contact me as needed. Enjoy [name]!  Daneen Schick, BSW, CDP Social Worker, Certified Dementia Practitioner Care Coordination [phone]

## 2022-03-08 NOTE — Telephone Encounter (Signed)
Provider is aware of requested orders below. Per Tommi Rumps okay to fill out FL2 forms.Advised Pt daughter [name] of update. I called [facility] to see if pt can get a lab for TB vs skin test. Awaiting a call back for advise. [name] aware of update.

## 2022-03-12 ENCOUNTER — Encounter: Payer: Self-pay | Admitting: Adult Health

## 2022-03-12 ENCOUNTER — Telehealth: Payer: Self-pay | Admitting: Adult Health

## 2022-03-12 NOTE — Telephone Encounter (Signed)
Daughter called back. [name] [phone]

## 2022-03-12 NOTE — Telephone Encounter (Signed)
Pt's daughter states pt needs a tb test to move into assisted living.  She states she had spoke to you about it and is requesting a call back.  She also sent a MyChart message.

## 2022-03-12 NOTE — Telephone Encounter (Signed)
Called pt daughter but she was on a conference call and will call back.

## 2022-03-12 NOTE — Telephone Encounter (Signed)
Tried to call pt daughter no answer. Also called [name] and no answer from nurse regarding TB lab

## 2022-03-12 NOTE — Telephone Encounter (Signed)
[place] for wheelchair?

## 2022-03-13 ENCOUNTER — Other Ambulatory Visit: Payer: Self-pay

## 2022-03-13 ENCOUNTER — Other Ambulatory Visit: Payer: Self-pay | Admitting: Adult Health

## 2022-03-13 DIAGNOSIS — R2681 Unsteadiness on feet: Secondary | ICD-10-CM

## 2022-03-13 NOTE — Progress Notes (Signed)
Referral to PT for motorized wheelchair evaluation

## 2022-03-13 NOTE — Telephone Encounter (Signed)
Left detailed message informing  of  [name] of update.

## 2022-03-15 ENCOUNTER — Ambulatory Visit: Payer: Medicare PPO

## 2022-03-15 DIAGNOSIS — Z111 Encounter for screening for respiratory tuberculosis: Secondary | ICD-10-CM

## 2022-03-15 NOTE — Progress Notes (Signed)
Per Cory's nurse, Tillie Rung; patient will have it read at the nursing home on Monday. An alternative, if needed, patient can come office to have it read no later than 1pm.   PPD Placement note  [place], 88 y.o. female is here today for placement of PPD test Reason for PPD test: [facility] living Pt taken PPD test before: no Verified in allergy area and with patient that they are not allergic to the products PPD is made of (Phenol or Tween). No: verified Is patient taking any oral or IV steroid medication now or have they taken it in the last month? no Has the patient ever received the BCG vaccine?: no Has the patient been in recent contact with anyone known or suspected of having active TB disease?: no      Date of exposure (if applicable): N/A      Name of person they were exposed to (if applicable): N/A  O: Alert and oriented in NAD. P:  PPD placed on 03/15/2022.  Patient advised to return for reading within 48-72 hours.   TB 0.72m ID placed on R forearm Lot#: [id number]:04/06/2025 NDC:[id number]

## 2022-03-19 ENCOUNTER — Telehealth: Payer: Self-pay | Admitting: Adult Health

## 2022-03-19 ENCOUNTER — Encounter: Payer: Self-pay | Admitting: Adult Health

## 2022-03-19 ENCOUNTER — Telehealth: Payer: Self-pay | Admitting: Pulmonary Disease

## 2022-03-19 MED ORDER — IPRATROPIUM-ALBUTEROL 0.5-2.5 (3) MG/3ML IN SOLN: 3.0000 mL | Freq: Four times a day (QID) | RESPIRATORY_TRACT | 11 refills | Status: AC | PRN

## 2022-03-19 MED ORDER — ALBUTEROL SULFATE HFA 108 (90 BASE) MCG/ACT IN AERS
2.0000 | INHALATION_SPRAY | Freq: Four times a day (QID) | RESPIRATORY_TRACT | 3 refills | Status: AC | PRN
Start: 2022-03-19 — End: ?

## 2022-03-19 NOTE — Telephone Encounter (Signed)
Pt needs medication list to be signed and dated by provider along with the requested forms. Pls call daughter when complete [phone]. Needs this signed and to the facility by tomorrow (03/20/22)

## 2022-03-19 NOTE — Telephone Encounter (Signed)
[name] pt daughter is calling and [facility] needs TB info lot number etc. Please fax to Broadus John at [phone] and FL-2 med list needs clarification and she would like kendra to return her call

## 2022-03-19 NOTE — Telephone Encounter (Signed)
Pt is being moved to [facility] on Friday. Needs order statging that she can keep albuterol at all times. Pt also would like to keep duoneb nebulizer at all times as well.  Broadus John is the move in coordinator. Fax is [phone]

## 2022-03-19 NOTE — Telephone Encounter (Signed)
Called and spoke with [name].  [name] stated patient is moving to [facility] room 319 03/22/22.  [name] stated patient uses a neb machine and self administers Duo Nebs.  Patient also keeps her Albuterol inhaler with her in case it is needed.  [name] needs order from Dr. Vaughan Browner to keep Albuterol inhaler, Duo Neb at bedside, and use neb machine for self administration.  Albuterol and Duo neb prescriptions printed out with instructions.  [name] requested order be faxed to [facility] [phone] attention [facility].

## 2022-03-19 NOTE — Telephone Encounter (Signed)
Fax sent to [facility] [phone] attention Broadus John. Confirmation received.  Nothing further at this time.

## 2022-03-20 NOTE — Telephone Encounter (Signed)
Pt daughter is call back and stated she need this done today because pt is to move in and she need Tommi Rumps to sign paper for her medication

## 2022-03-21 NOTE — Telephone Encounter (Signed)
Paperwork was picked up and faxed as well. Pt daughter verbalized that nothing else was needed

## 2022-03-23 DIAGNOSIS — J449 Chronic obstructive pulmonary disease, unspecified: Secondary | ICD-10-CM | POA: Diagnosis not present

## 2022-03-23 DIAGNOSIS — I509 Heart failure, unspecified: Secondary | ICD-10-CM | POA: Diagnosis not present

## 2022-03-23 DIAGNOSIS — H3530 Unspecified macular degeneration: Secondary | ICD-10-CM | POA: Diagnosis not present

## 2022-03-23 DIAGNOSIS — R269 Unspecified abnormalities of gait and mobility: Secondary | ICD-10-CM | POA: Diagnosis not present

## 2022-03-23 DIAGNOSIS — I69398 Other sequelae of cerebral infarction: Secondary | ICD-10-CM | POA: Diagnosis not present

## 2022-03-23 DIAGNOSIS — F419 Anxiety disorder, unspecified: Secondary | ICD-10-CM | POA: Diagnosis not present

## 2022-03-23 DIAGNOSIS — N183 Chronic kidney disease, stage 3 unspecified: Secondary | ICD-10-CM | POA: Diagnosis not present

## 2022-03-23 DIAGNOSIS — E039 Hypothyroidism, unspecified: Secondary | ICD-10-CM | POA: Diagnosis not present

## 2022-03-23 DIAGNOSIS — E785 Hyperlipidemia, unspecified: Secondary | ICD-10-CM | POA: Diagnosis not present

## 2022-03-27 ENCOUNTER — Encounter: Payer: Self-pay | Admitting: Pulmonary Disease

## 2022-03-28 ENCOUNTER — Other Ambulatory Visit: Payer: Self-pay

## 2022-03-28 ENCOUNTER — Emergency Department (HOSPITAL_COMMUNITY): Payer: Medicare PPO

## 2022-03-28 ENCOUNTER — Encounter (HOSPITAL_COMMUNITY): Payer: Self-pay | Admitting: Oncology

## 2022-03-28 ENCOUNTER — Inpatient Hospital Stay (HOSPITAL_COMMUNITY)
Admission: EM | Admit: 2022-03-28 | Discharge: 2022-04-03 | DRG: 177 | Disposition: A | Discharge status: Home / Self Care | Payer: Medicare PPO | Attending: Internal Medicine | Admitting: Internal Medicine

## 2022-03-28 DIAGNOSIS — R0602 Shortness of breath: Secondary | ICD-10-CM | POA: Diagnosis not present

## 2022-03-28 DIAGNOSIS — D638 Anemia in other chronic diseases classified elsewhere: Secondary | ICD-10-CM | POA: Diagnosis present

## 2022-03-28 DIAGNOSIS — N1832 Chronic kidney disease, stage 3b: Secondary | ICD-10-CM | POA: Diagnosis present

## 2022-03-28 DIAGNOSIS — I2510 Atherosclerotic heart disease of native coronary artery without angina pectoris: Secondary | ICD-10-CM | POA: Diagnosis present

## 2022-03-28 DIAGNOSIS — J1282 Pneumonia due to coronavirus disease 2019: Secondary | ICD-10-CM | POA: Diagnosis present

## 2022-03-28 DIAGNOSIS — J441 Chronic obstructive pulmonary disease with (acute) exacerbation: Secondary | ICD-10-CM | POA: Diagnosis present

## 2022-03-28 DIAGNOSIS — J40 Bronchitis, not specified as acute or chronic: Secondary | ICD-10-CM | POA: Diagnosis not present

## 2022-03-28 DIAGNOSIS — Z66 Do not resuscitate: Secondary | ICD-10-CM | POA: Diagnosis present

## 2022-03-28 DIAGNOSIS — J9601 Acute respiratory failure with hypoxia: Secondary | ICD-10-CM | POA: Diagnosis not present

## 2022-03-28 DIAGNOSIS — U071 COVID-19: Secondary | ICD-10-CM | POA: Diagnosis not present

## 2022-03-28 DIAGNOSIS — E0500 Thyrotoxicosis with diffuse goiter without thyrotoxic crisis or storm: Secondary | ICD-10-CM | POA: Diagnosis present

## 2022-03-28 DIAGNOSIS — R531 Weakness: Secondary | ICD-10-CM | POA: Diagnosis not present

## 2022-03-28 DIAGNOSIS — R1310 Dysphagia, unspecified: Secondary | ICD-10-CM | POA: Diagnosis present

## 2022-03-28 DIAGNOSIS — D709 Neutropenia, unspecified: Secondary | ICD-10-CM | POA: Diagnosis present

## 2022-03-28 DIAGNOSIS — R6520 Severe sepsis without septic shock: Secondary | ICD-10-CM | POA: Diagnosis present

## 2022-03-28 DIAGNOSIS — E871 Hypo-osmolality and hyponatremia: Secondary | ICD-10-CM | POA: Diagnosis not present

## 2022-03-28 DIAGNOSIS — I130 Hypertensive heart and chronic kidney disease with heart failure and stage 1 through stage 4 chronic kidney disease, or unspecified chronic kidney disease: Secondary | ICD-10-CM | POA: Diagnosis present

## 2022-03-28 DIAGNOSIS — R739 Hyperglycemia, unspecified: Secondary | ICD-10-CM | POA: Diagnosis present

## 2022-03-28 DIAGNOSIS — Z87891 Personal history of nicotine dependence: Secondary | ICD-10-CM | POA: Diagnosis not present

## 2022-03-28 DIAGNOSIS — I5042 Chronic combined systolic (congestive) and diastolic (congestive) heart failure: Secondary | ICD-10-CM | POA: Diagnosis not present

## 2022-03-28 DIAGNOSIS — E785 Hyperlipidemia, unspecified: Secondary | ICD-10-CM | POA: Diagnosis not present

## 2022-03-28 DIAGNOSIS — F419 Anxiety disorder, unspecified: Secondary | ICD-10-CM | POA: Diagnosis present

## 2022-03-28 DIAGNOSIS — Z515 Encounter for palliative care: Secondary | ICD-10-CM

## 2022-03-28 DIAGNOSIS — R062 Wheezing: Secondary | ICD-10-CM | POA: Diagnosis not present

## 2022-03-28 DIAGNOSIS — Z7952 Long term (current) use of systemic steroids: Secondary | ICD-10-CM

## 2022-03-28 DIAGNOSIS — J440 Chronic obstructive pulmonary disease with acute lower respiratory infection: Secondary | ICD-10-CM | POA: Diagnosis present

## 2022-03-28 DIAGNOSIS — E039 Hypothyroidism, unspecified: Secondary | ICD-10-CM | POA: Diagnosis not present

## 2022-03-28 DIAGNOSIS — M19012 Primary osteoarthritis, left shoulder: Secondary | ICD-10-CM | POA: Diagnosis not present

## 2022-03-28 DIAGNOSIS — I350 Nonrheumatic aortic (valve) stenosis: Secondary | ICD-10-CM | POA: Diagnosis present

## 2022-03-28 DIAGNOSIS — Z800 Family history of malignant neoplasm of digestive organs: Secondary | ICD-10-CM

## 2022-03-28 DIAGNOSIS — I459 Conduction disorder, unspecified: Secondary | ICD-10-CM | POA: Diagnosis present

## 2022-03-28 DIAGNOSIS — Z79890 Hormone replacement therapy (postmenopausal): Secondary | ICD-10-CM | POA: Diagnosis not present

## 2022-03-28 DIAGNOSIS — R001 Bradycardia, unspecified: Secondary | ICD-10-CM | POA: Diagnosis not present

## 2022-03-28 DIAGNOSIS — D509 Iron deficiency anemia, unspecified: Secondary | ICD-10-CM | POA: Diagnosis present

## 2022-03-28 DIAGNOSIS — A419 Sepsis, unspecified organism: Secondary | ICD-10-CM

## 2022-03-28 DIAGNOSIS — A0839 Other viral enteritis: Secondary | ICD-10-CM | POA: Diagnosis not present

## 2022-03-28 DIAGNOSIS — M19011 Primary osteoarthritis, right shoulder: Secondary | ICD-10-CM | POA: Diagnosis not present

## 2022-03-28 DIAGNOSIS — Z8673 Personal history of transient ischemic attack (TIA), and cerebral infarction without residual deficits: Secondary | ICD-10-CM

## 2022-03-28 DIAGNOSIS — R0609 Other forms of dyspnea: Secondary | ICD-10-CM | POA: Diagnosis not present

## 2022-03-28 DIAGNOSIS — Z8719 Personal history of other diseases of the digestive system: Secondary | ICD-10-CM

## 2022-03-28 LAB — CBC
HCT: 30.7 % — ABNORMAL LOW (ref 36.0–46.0)
Hemoglobin: 9.3 g/dL — ABNORMAL LOW (ref 12.0–15.0)
MCH: 26.1 pg (ref 26.0–34.0)
MCHC: 30.3 g/dL (ref 30.0–36.0)
MCV: 86 fL (ref 80.0–100.0)
Platelets: 159 10*3/uL (ref 150–400)
RBC: 3.57 MIL/uL — ABNORMAL LOW (ref 3.87–5.11)
RDW: 16.2 % — ABNORMAL HIGH (ref 11.5–15.5)
WBC: 2.9 10*3/uL — ABNORMAL LOW (ref 4.0–10.5)
nRBC: 0 % (ref 0.0–0.2)

## 2022-03-28 LAB — DIFFERENTIAL
Abs Immature Granulocytes: 0.04 10*3/uL (ref 0.00–0.07)
Basophils Absolute: 0 10*3/uL (ref 0.0–0.1)
Basophils Relative: 1 %
Eosinophils Absolute: 0 10*3/uL (ref 0.0–0.5)
Eosinophils Relative: 0 %
Immature Granulocytes: 1 %
Lymphocytes Relative: 20 %
Lymphs Abs: 0.6 10*3/uL — ABNORMAL LOW (ref 0.7–4.0)
Monocytes Absolute: 0.3 10*3/uL (ref 0.1–1.0)
Monocytes Relative: 10 %
Neutro Abs: 1.9 10*3/uL (ref 1.7–7.7)
Neutrophils Relative %: 68 %

## 2022-03-28 LAB — COMPREHENSIVE METABOLIC PANEL
ALT: 26 U/L (ref 0–44)
AST: 34 U/L (ref 15–41)
Albumin: 3.2 g/dL — ABNORMAL LOW (ref 3.5–5.0)
Alkaline Phosphatase: 76 U/L (ref 38–126)
Anion gap: 8 (ref 5–15)
BUN: 24 mg/dL — ABNORMAL HIGH (ref 8–23)
CO2: 25 mmol/L (ref 22–32)
Calcium: 7.9 mg/dL — ABNORMAL LOW (ref 8.9–10.3)
Chloride: 98 mmol/L (ref 98–111)
Creatinine, Ser: 1.18 mg/dL — ABNORMAL HIGH (ref 0.44–1.00)
GFR, Estimated: 43 mL/min — ABNORMAL LOW (ref >60)
Glucose, Bld: 195 mg/dL — ABNORMAL HIGH (ref 70–99)
Potassium: 3.7 mmol/L (ref 3.5–5.1)
Sodium: 131 mmol/L — ABNORMAL LOW (ref 135–145)
Total Bilirubin: 0.7 mg/dL (ref 0.3–1.2)
Total Protein: 6 g/dL — ABNORMAL LOW (ref 6.5–8.1)

## 2022-03-28 LAB — PROTIME-INR
INR: 1 (ref 0.8–1.2)
Prothrombin Time: 13.2 seconds (ref 11.4–15.2)

## 2022-03-28 LAB — IRON AND TIBC
Iron: 17 ug/dL — ABNORMAL LOW (ref 28–170)
Saturation Ratios: 5 % — ABNORMAL LOW (ref 10.4–31.8)
TIBC: 329 ug/dL (ref 250–450)
UIBC: 312 ug/dL

## 2022-03-28 LAB — RESP PANEL BY RT-PCR (FLU A&B, COVID) ARPGX2
Influenza A by PCR: NEGATIVE
Influenza B by PCR: NEGATIVE
SARS Coronavirus 2 by RT PCR: POSITIVE — AB

## 2022-03-28 LAB — BRAIN NATRIURETIC PEPTIDE: B Natriuretic Peptide: 386 pg/mL — ABNORMAL HIGH (ref 0.0–100.0)

## 2022-03-28 LAB — FERRITIN: Ferritin: 23 ng/mL (ref 11–307)

## 2022-03-28 LAB — LACTIC ACID, PLASMA: Lactic Acid, Venous: 2.3 mmol/L (ref 0.5–1.9)

## 2022-03-28 LAB — URIC ACID: Uric Acid, Serum: 5.9 mg/dL (ref 2.5–7.1)

## 2022-03-28 LAB — MAGNESIUM: Magnesium: 2 mg/dL (ref 1.7–2.4)

## 2022-03-28 LAB — TSH: TSH: 0.485 u[IU]/mL (ref 0.350–4.500)

## 2022-03-28 LAB — PROCALCITONIN: Procalcitonin: <0.1 ng/mL

## 2022-03-28 MED ORDER — PREDNISONE 50 MG PO TABS
50.0000 mg | ORAL_TABLET | Freq: Every day | ORAL | Status: DC
  Administered 2022-03-31 – 2022-04-03 (×4): 50 mg via ORAL
  Filled 2022-03-28 (×4): qty 1

## 2022-03-28 MED ORDER — IPRATROPIUM-ALBUTEROL 0.5-2.5 (3) MG/3ML IN SOLN
3.0000 mL | Freq: Four times a day (QID) | RESPIRATORY_TRACT | Status: DC
  Administered 2022-03-29: 3 mL via RESPIRATORY_TRACT
  Filled 2022-03-28: qty 3

## 2022-03-28 MED ORDER — SODIUM CHLORIDE 0.9 % IV SOLN
100.0000 mg | Freq: Every day | INTRAVENOUS | Status: AC
  Administered 2022-03-29 – 2022-03-30 (×2): 100 mg via INTRAVENOUS
  Filled 2022-03-28 (×2): qty 20

## 2022-03-28 MED ORDER — PANTOPRAZOLE SODIUM 40 MG PO TBEC
40.0000 mg | DELAYED_RELEASE_TABLET | Freq: Every day | ORAL | Status: DC
  Administered 2022-03-29 – 2022-04-03 (×6): 40 mg via ORAL
  Filled 2022-03-28 (×6): qty 1

## 2022-03-28 MED ORDER — ACETAMINOPHEN 650 MG RE SUPP: 650.0000 mg | Freq: Four times a day (QID) | RECTAL | Status: DC | PRN

## 2022-03-28 MED ORDER — ACETAMINOPHEN 325 MG PO TABS
650.0000 mg | ORAL_TABLET | Freq: Four times a day (QID) | ORAL | Status: DC | PRN
  Filled 2022-03-28: qty 2

## 2022-03-28 MED ORDER — SODIUM CHLORIDE 0.9 % IV SOLN
100.0000 mg | Freq: Two times a day (BID) | INTRAVENOUS | Status: DC
  Administered 2022-03-28 – 2022-03-30 (×4): 100 mg via INTRAVENOUS
  Filled 2022-03-28 (×5): qty 100

## 2022-03-28 MED ORDER — MAGNESIUM SULFATE 2 GM/50ML IV SOLN
2.0000 g | Freq: Once | INTRAVENOUS | Status: AC
  Administered 2022-03-28: 2 g via INTRAVENOUS
  Filled 2022-03-28: qty 50

## 2022-03-28 MED ORDER — LATANOPROST 0.005 % OP SOLN
1.0000 [drp] | Freq: Every day | OPHTHALMIC | Status: DC
  Administered 2022-03-30 – 2022-04-02 (×4): 1 [drp] via OPHTHALMIC
  Filled 2022-03-28 (×2): qty 2.5

## 2022-03-28 MED ORDER — ALBUTEROL SULFATE (2.5 MG/3ML) 0.083% IN NEBU
2.5000 mg | INHALATION_SOLUTION | Freq: Once | RESPIRATORY_TRACT | Status: AC
  Administered 2022-03-28: 2.5 mg via RESPIRATORY_TRACT
  Filled 2022-03-28: qty 3

## 2022-03-28 MED ORDER — SODIUM CHLORIDE 0.9 % IV SOLN
200.0000 mg | Freq: Once | INTRAVENOUS | Status: AC
  Administered 2022-03-28: 200 mg via INTRAVENOUS
  Filled 2022-03-28: qty 40

## 2022-03-28 MED ORDER — ALBUTEROL SULFATE (2.5 MG/3ML) 0.083% IN NEBU: 2.5000 mg | INHALATION_SOLUTION | RESPIRATORY_TRACT | Status: DC | PRN

## 2022-03-28 MED ORDER — IPRATROPIUM-ALBUTEROL 0.5-2.5 (3) MG/3ML IN SOLN: 3.0000 mL | Freq: Four times a day (QID) | RESPIRATORY_TRACT | Status: DC

## 2022-03-28 MED ORDER — LEVOTHYROXINE SODIUM 50 MCG PO TABS
75.0000 ug | ORAL_TABLET | Freq: Every day | ORAL | Status: DC
  Administered 2022-03-29 – 2022-04-03 (×6): 75 ug via ORAL
  Filled 2022-03-28 (×6): qty 1

## 2022-03-28 MED ORDER — IPRATROPIUM-ALBUTEROL 0.5-2.5 (3) MG/3ML IN SOLN
3.0000 mL | Freq: Once | RESPIRATORY_TRACT | Status: AC
  Administered 2022-03-28: 3 mL via RESPIRATORY_TRACT
  Filled 2022-03-28: qty 3

## 2022-03-28 MED ORDER — METHYLPREDNISOLONE SODIUM SUCC 125 MG IJ SOLR
125.0000 mg | Freq: Every day | INTRAMUSCULAR | Status: AC
  Administered 2022-03-28 – 2022-03-30 (×3): 125 mg via INTRAVENOUS
  Filled 2022-03-28 (×3): qty 2

## 2022-03-28 NOTE — Telephone Encounter (Signed)
Ok. Noted 

## 2022-03-28 NOTE — H&P (Addendum)
History and Physical    PLEASE NOTE THAT DRAGON DICTATION SOFTWARE WAS USED IN THE CONSTRUCTION OF THIS NOTE.   [name] ATF:[id number] DOB: 01-May-1925 DOA: 03/28/2022  PCP: [name], NP  Patient coming from: home   I have personally briefly reviewed patient's old medical records in [place]  Chief Complaint: Shortness of breath she reports  HPI: [name] is a 88 y.o. female with medical history significant for severe COPD on chronic corticosteroid therapy, chronic systolic/diastolic heart failure, moderate aortic stenosis, chronic kidney disease stage IIIb with baseline creatinine range 1.2-1.4, hypertension, hyperlipidemia, acquired hypothyroidism, who is admitted to [facility] on 03/28/2022 with severe COVID-19 infection after presenting from SNF to [facility] ED complaining of shortness of breath.   The patient reports to 3 days of new onset shortness of breath associated with new onset nonproductive cough as well as wheezing.  Also associated with subjective fever and generalized myalgias in the absence of chills or full body rigors.  Shortness of breath has not been associated with any orthopnea, PND, or worsening peripheral edema.  No recent chest pain, palpitations, diaphoresis, nausea, vomiting, dizziness, presyncope, or syncope.  States  No recent abdominal pain, rash, diarrhea.  Denies any recent dysuria or gross hematuria.  Medical history notable for chronic systolic/diastolic heart failure as well as moderate aortic stenosis, with most recent echocardiogram in June 2022 notable for LVEF 45%, no focal motion normalities, mild LVH, grade 1 diastolic dysfunction, mildly dilated left atrium, and moderate aortic stenosis with aortic valve area of 0.98 cm.  IDDM 9.  She also has history of severe COPD, chronically steroid dependent, on prednisone 5 mg p.o. daily as an outpatient.  No known baseline supplemental oxygen requirements.  Additionally, she has  history of anemia of chronic disease associated asymptomatic.  She 10-12, with most recent prior hemoglobin noted to be 10.8 on 02/07/22.   She underwent COVID-19 testing at SNF yesterday, which was found to be positive.  In the setting of further progression of the above respiratory symptoms over the course the next day, she presents to [facility] emergency department sooner for further evaluation management of the above.     ED Course:  Vital signs in the ED were notable for the following: Afebrile; heart rate 27-0 02; systolic blood pressures in the 120s to 140s; respiratory rate 16-22; oxygen saturation 87% on room air, subsequent improving into the range of 95 to 90% on 2 L nasal cannula.  Labs were notable for the following: CMP notable for sodium 131, which corrects to proxy 130.6 when taking into account concomitant hyperglycemia, relative to most recent prior serum sodium level 138 on 02/07/2022, prn 25, creatinine 1.18 compared to 1.21 9323, calcium, just for mild hypoalbuminemia noted to be 8.5, avidin 3.2, otherwise, liver enzymes within normal limits, including AST 34.  CBC notable for white cell count 2900, hemoglobin 9.3 sissy with normocytic/normochromic findings, platelet count 159.  COVID-19 PCR in the ED today positive, will influenza A/B negative.  Imaging and additional notable ED work-up: Interpretation of EKG performed in the ED today is somewhat limited by presence of motion artifact, but within these confines, and compared to most recent prior EKG performed on 02/27/2021, appears to demonstrate sinus rhythm with multiple PACs, nonspecific intraventricular conduction delay, heart rate 86, nonspecific T wave inversion in aVL and V6, which appears unchanged from most recent prior EKG, also showing evidence of ST elevation in V3, also appears unchanged from most recent  prior EKG, without any new T wave or ST changes noted.  Chest x-ray shows no evidence of acute cardiopulmonary  process.  While in the ED, the following were administered: Duo nebulizer treatment, albuterol nebulizer treatment, 2 g of IV magnesium sulfate.  Subsequently, the patient was admitted for further evaluation management of severe OEVOJ-50 infection complicated by acute hypoxic respiratory distress as well as acute COPD exacerbation, criteria met for severe sepsis and presenting labs notable for acute hyponatremia.     Review of Systems: As per HPI otherwise 10 point review of systems negative.   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   DIVERTICULOSIS, COLON 09/30/2008   DYSPNEA 07/13/2008   Graves disease    History of embolic stroke 0/93/8182   Left brain   HYPERLIPIDEMIA 03/06/2007   HYPERTENSION 03/06/2007   HYPOTHYROIDISM 10/13/2007   OSTEOARTHRITIS 03/06/2007   Personal history of colonic polyps 09/30/2008   Stroke ([place])    06/2014            TOBACCO USE, QUIT 04/12/2009   WEAKNESS 11/09/2007    Past Surgical History:  Procedure Laterality Date   CARDIAC CATHETERIZATION     CATARACT EXTRACTION Bilateral    CHOLECYSTECTOMY     ENDARTERECTOMY Left 11/13/2015   Procedure: LEFT CAROTID ARTERY ENDARTERECTOMY;  Surgeon: Conrad Lindsay, MD;  Location[facility] OR;  Service: Vascular;  Laterality: Left;   ESOPHAGOGASTRODUODENOSCOPY (EGD) WITH PROPOFOL N/A 10/11/2016   Procedure: ESOPHAGOGASTRODUODENOSCOPY (EGD) WITH PROPOFOL;  Surgeon: Mauri Pole, MD;  Location: [facility] ENDOSCOPY;  Service: Endoscopy;  Laterality: N/A;   KNEE SURGERY     PATCH ANGIOPLASTY Left 11/13/2015   Procedure: WITH 1CM X 6CM  XENOSURE BIOLOGIC PATCH ANGIOPLASTY;  Surgeon: Conrad [place], MD;  Location: [facility];   Service: Vascular;  Laterality: Left;   TEE WITHOUT CARDIOVERSION N/A 06/24/2014   Procedure: TRANSESOPHAGEAL ECHOCARDIOGRAM (TEE);  Surgeon: Sanda Klein, MD;  Location: [facility] ENDOSCOPY;  Service: Cardiovascular;  Laterality: N/A;    Social History:  reports that she quit smoking about 43 years ago. Her smoking use included cigarettes. She started smoking about 72 years ago. She has a 43.50 pack-year smoking history. She has never used smokeless tobacco. She reports current alcohol use of about 1.0 standard drink of alcohol per week. She reports that she does not use drugs.   Allergies  Allergen Reactions   Catapres [Clonidine Hcl] Other (See Comments)    Made pt feel horrible, shaky, weak and nausea. Not listed on MAR   Clonidine Other (See Comments)    Made pt feel horrible, shaky, weak and nausea. Not listed on MAR   Lisinopril Anaphylaxis    Tongue swelling. Not listed on MAR   Labetalol Other (See Comments)    Caused bradycardia and syncope. Not listed on MAR   Labetalol Hcl Other (See Comments)    Caused bradycardia and syncope. Not listed on MAR    Family History  Problem Relation Age of Onset   Stomach cancer Maternal Grandmother    Colon cancer Neg Hx     Family history reviewed and not pertinent    Prior to Admission medications   Medication Sig Start  Date End Date Taking? Authorizing Provider  albuterol (PROAIR HFA) 108 (90 Base) MCG/ACT inhaler Inhale 2 puffs into the lungs every 6 (six) hours as needed for wheezing or shortness of breath. 03/19/22  Yes Mannam, Praveen, MD  ALPRAZolam (XANAX) 0.5 MG tablet TAKE 1 TABLET BY MOUTH 3 TIMES DAILY. Patient taking differently: Take 0.25 mg by mouth 3 (three) times daily as needed for anxiety. 12/18/21  Yes Nafziger, Tommi Rumps, NP  amLODipine (NORVASC) 5 MG tablet TAKE 1 TABLET (5 MG TOTAL) BY MOUTH DAILY. 11/26/21  Yes Skeet Latch, MD  aspirin EC 81 MG EC tablet Take 1 tablet (81 mg total) by mouth daily. Swallow  whole. Patient taking differently: Take 81 mg by mouth daily. 12/28/20  Yes Enzo Bi, MD  atorvastatin (LIPITOR) 80 MG tablet TAKE 1 TABLET BY MOUTH EVERY DAY AT 6PM Patient taking differently: Take 80 mg by mouth daily at 6 PM. 02/22/22  Yes Skeet Latch, MD  Fluticasone-Umeclidin-Vilant (TRELEGY ELLIPTA) 100-62.5-25 MCG/ACT AEPB Inhale 1 puff into the lungs daily. 09/19/21  Yes Mannam, Praveen, MD  furosemide (LASIX) 20 MG tablet TAKE 1 TABLET BY MOUTH EVERY DAY 11/26/21  Yes Skeet Latch, MD  hydrALAZINE (APRESOLINE) 50 MG tablet TAKE 1 TABLET BY MOUTH THREE TIMES A DAY Patient taking differently: Take 50 mg by mouth 3 (three) times daily. 11/26/21  Yes Skeet Latch, MD  isosorbide mononitrate (IMDUR) 30 MG 24 hr tablet TAKE 1 TABLET BY MOUTH EVERY DAY Patient taking differently: Take 30 mg by mouth daily. 11/26/21  Yes Skeet Latch, MD  KLOR-CON M20 20 MEQ tablet TAKE 1 TABLET BY MOUTH EVERY DAY Patient taking differently: Take 20 mEq by mouth daily. 11/26/21  Yes Skeet Latch, MD  latanoprost (XALATAN) 0.005 % ophthalmic solution Place 1 drop into both eyes at bedtime.  04/02/19  Yes [provider]  levothyroxine (SYNTHROID) 75 MCG tablet TAKE 1 TABLET BY MOUTH DAILY BEFORE BREAKFAST. Patient taking differently: Take 75 mcg by mouth daily before breakfast. 04/23/21  Yes Nafziger, Tommi Rumps, NP  pantoprazole (PROTONIX) 40 MG tablet Take 1 tablet (40 mg total) by mouth daily. SCHEDULE APPT FOR FUTURE REFILLS 01/28/22  Yes Nafziger, Tommi Rumps, NP  predniSONE (DELTASONE) 5 MG tablet Take 1 tablet (5 mg total) by mouth daily with breakfast. 02/11/22  Yes Mannam, Praveen, MD  Cyanocobalamin (B-12 PO) Take 1 tablet by mouth daily. Patient not taking: Reported on 03/28/2022    [provider]  guaiFENesin (MUCINEX) 600 MG 12 hr tablet Take by mouth every 12 (twelve) hours. For 7 days Patient not taking: Reported on 03/28/2022    [provider]  ipratropium-albuterol  (DUONEB) 0.5-2.5 (3) MG/3ML SOLN Take 3 mLs by nebulization every 6 (six) hours as needed (copd). Patient not taking: Reported on 03/28/2022 03/19/22   Marshell Garfinkel, MD  molnupiravir EUA (LAGEVRIO) 200 MG CAPS capsule Take 4 capsules by mouth 2 (two) times daily. For 5 days Patient not taking: Reported on 03/28/2022    [provider]     Objective    Physical Exam: Vitals:   03/28/22 1730 03/28/22 1800 03/28/22 1832 03/28/22 1900  BP: (!) 122/58 (!) 119/54  133/78  Pulse: 70 73  72  Resp: _0 Temp:   98.7 F (37.1 C)   TempSrc:   Oral   SpO2: 94% 92%  94%  Weight:      Height:        General: appears to be stated age; alert; mildly increased work of  breathing noted Skin: warm, dry, no rash Head:  AT/[place] Mouth:  Oral mucosa membranes appear moist, normal dentition Neck: supple; trachea midline Heart:  RRR; did not appreciate any M/R/G Lungs: CTAB, did not appreciate any wheezes, rales, or rhonchi Abdomen: + BS; soft, ND, NT Vascular: 2+ pedal pulses b/l; 2+ radial pulses b/l Extremities: no peripheral edema, no muscle wasting Neuro: strength and sensation intact in upper and lower extremities b/l    Labs on Admission: I have personally reviewed following labs and imaging studies  CBC: Recent Labs  Lab 03/28/22 1501  WBC 2.9*  NEUTROABS 1.9  HGB 9.3*  HCT 30.7*  MCV 86.0  PLT 093   Basic Metabolic Panel: Recent Labs  Lab 03/28/22 1501  NA 131*  K 3.7  CL 98  CO2 25  GLUCOSE 195*  BUN 24*  CREATININE 1.18*  CALCIUM 7.9*   GFR: Estimated Creatinine Clearance: 27.3 mL/min (A) (by C-G formula based on SCr of 1.18 mg/dL (H)). Liver Function Tests: Recent Labs  Lab 03/28/22 1501  AST 34  ALT 26  ALKPHOS 76  BILITOT 0.7  PROT 6.0*  ALBUMIN 3.2*   No results for input(s): "LIPASE", "AMYLASE" in the last 168 hours. No results for input(s): "AMMONIA" in the last 168 hours. Coagulation Profile: No results for input(s): "INR",  "PROTIME" in the last 168 hours. Cardiac Enzymes: No results for input(s): "CKTOTAL", "CKMB", "CKMBINDEX", "TROPONINI" in the last 168 hours. BNP (last 3 results) No results for input(s): "PROBNP" in the last 8760 hours. HbA1C: No results for input(s): "HGBA1C" in the last 72 hours. CBG: No results for input(s): "GLUCAP" in the last 168 hours. Lipid Profile: No results for input(s): "CHOL", "HDL", "LDLCALC", "TRIG", "CHOLHDL", "LDLDIRECT" in the last 72 hours. Thyroid Function Tests: No results for input(s): "TSH", "T4TOTAL", "FREET4", "T3FREE", "THYROIDAB" in the last 72 hours. Anemia Panel: No results for input(s): "VITAMINB12", "FOLATE", "FERRITIN", "TIBC", "IRON", "RETICCTPCT" in the last 72 hours. Urine analysis:    Component Value Date/Time   COLORURINE STRAW (A) 02/26/2021 2035   APPEARANCEUR CLEAR 02/26/2021 2035   LABSPEC 1.012 02/26/2021 2035   PHURINE 8.0 02/26/2021 2035   GLUCOSEU NEGATIVE 02/26/2021 2035   GLUCOSEU NEGATIVE 12/01/2019 1028   HGBUR NEGATIVE 02/26/2021 2035   BILIRUBINUR NEGATIVE 02/26/2021 2035   BILIRUBINUR n 08/17/2012 1455   KETONESUR NEGATIVE 02/26/2021 2035   PROTEINUR NEGATIVE 02/26/2021 2035   UROBILINOGEN 0.2 12/01/2019 1028   NITRITE NEGATIVE 02/26/2021 2035   LEUKOCYTESUR NEGATIVE 02/26/2021 2035    Radiological Exams on Admission: DG Chest Port 1 View  Result Date: 03/28/2022 CLINICAL DATA:  Short of breath, weakness, possible COVID EXAM: PORTABLE CHEST 1 VIEW COMPARISON:  Prior chest x-ray 02/27/2021 FINDINGS: Stable mild cardiomegaly. Atherosclerotic calcifications again noted throughout the thoracic aorta. Background bronchitic changes are stable. Left lower lobe patchy airspace opacity also appears similar compared to prior. Linear atelectasis versus scarring in the right lung base. No new airspace opacity, pulmonary edema or pneumothorax. No acute osseous abnormality. Degenerative changes at both glenohumeral joints. IMPRESSION: Stable  chest x-ray without evidence of acute cardiopulmonary process. Electronically Signed   By: Jacqulynn Cadet M.D.   On: 03/28/2022 15:33     EKG: Independently reviewed, with result as described above.    Assessment/Plan    Principal Problem:   COVID-19 virus infection Active Problems:   Hypothyroidism   Anemia of chronic disease   HLD (hyperlipidemia)   Stage 3b chronic kidney disease (CKD) (HCC)   COPD with acute  exacerbation ([place])   Acute respiratory failure with hypoxia (HCC)   Chronic combined systolic and diastolic heart failure (HCC)   Acute hyponatremia   Severe sepsis ([place])      #) Severe COVID-19 infection: dx on the basis of 2 3 days of new onset shortness of breath, nonproductive cough, subjective fever generalized myalgias. Additionally, in context of no known baseline supplemental O2 requirements, the pt  is requiring 2 L nasal cannula in order to maintain O2 sats greater than or equal to 94%. In setting of this acute hypoxia, criteria are met for patient's COVID-19 infxn to be considered severe in nature. Consequently, we will escalate systemic corticosteroids relative home chronic prednisone therapy, as further detailed below, with dose that should also be compatible with management of resultant acute COPD exacerbation, as below.  CXR shows no evidence of acute process.  Of note, in the setting of the pt's age greater than 26 as well as multiple co-morbidities that include chronic systolic/diastolic heart failure, this pt meets criteria to be considered high risk for a more complicated clinical course of COVID-19 infxn, including increased probability for progression of the severity associated with this infxn. Therefore, in setting of symptomatic COVID-19 infxn requiring hospitalization for further evaluation and management thereof in this pt with the aforementioned high risk criteria who is felt to be early in the course of their infxn given onset of respiratory symptoms  starting less than 5 days ago, indications are met for initiation of 3 day course of remdesivir. Of note, ALT found to be less than 220. Therefore, there is no contraindication for initiation of remdesivir on the basis of transaminitis.   Will closely monitor ensuing degree of hypoxia as well as associated trend in supplemental O2 requirements. Will also trend inflammatory markers, as further described above. Does not appear to be indication for initiation of Tocilizumab at this time, as further described below. No h/o diabetes.    Plan: Airborne and contact precautions. Monitor continuous pulse ox and monitor on telemetry. prn supplemental O2 to maintain O2 sats greater than or equal to 94%. Proning protocol initiated. PRN albuterol neb. Scheduled duonebs.  Solumedrol 1 mg/kg IV BID. PRN acetaminophen for fever. Start remdesivir x 3 doses, as above. Check and trend inflammatory markers in form of CRP. Check serum mag and phos levels. Check CMP/CBC in the AM. Flutter valve and incentive spirometry. Check blood gas.            #) Severe Sepsis: Appears to be on the basis of COVID-19, as above, with SIRS criteria met via presenting leukopenia, tachycardia, tachypnea.  Patient's sepsis meets criteria to be consider severe in nature on the basis of concomitant acute hypoxic respiratory distress. No evidence of associated hypotension thus far. Will check stat LA, as below. In the absence of LA level greater than or equal to 4.0 and in the absence of hypotension, criteria are not met at this time for initiation of a 30 mL/kg IVF bolus. No evidence of additional underlying infectious process beyond COVID-19, and bacterial pneumonia is felt to be less likely. Will check procalcitonin level to further assess as well as UA. As patient's sepsis is felt to be viral in nature, will refrain from antibiotic coverage at this time. Of note, will start doxycycline, but this is for benefit of reduction in duration of  hospitalization in the context of acute COPD exacerbation, as further detailed below.  Plan: Work-up and management of COVID-19 infection, as above. Check blood cultures x2.  Repeat CBC with differential in the morning.  Check urinalysis. PRN acetaminophen for fever. STAT LA. check procalcitonin, as above. Will refrain from antibiotics for now per above rationale.           #) Acute COPD exacerbation: in the context of a documented history of  severe steroid dependent COPD, diagnosis of acute exacerbation on the basis of 2 to 3 days of progressive shortness of breath, nonproductive cough, which appears to be as a consequence of severe COVID-19 infection, as above with presenting CXR showing no evidence of acute cardiopulmonary process, including no evidence of infiltrate, edema, effusion, or pneumothorax.  She is a former smoker, with no known baseline supplemental oxygen demands.  Outpatient respiratory regimen appears to include Trelegy, prn albuterol inhaler, and prednisone 5 mg p.o. daily.  Plan: monitor continuous pulse oxymetry. Monitor on telemetry. Solumedrol. Scheduled duonebs q6 hours. Prn albuterol inhaler. CMP in the morning. Repeat CBC in the morning. Check serum Mg and Phos levels. Will attempt additional chart review to evaluate most recent PFT results. Will start doxycycline for benefit of shortened duration of hospitalization associated with antibiotic initiation in the setting of acute COPD exacerbation. Check blood gas. Add-on procalcitonin.  Further evaluation management of presenting severe COVID-19 infection, as above.          #) Acute hypoxic respiratory distress: in the context of no known baseline supplemental oxygen requirements, presenting O2 sat 87%, subsequent proving into the mid 90s on 2 L nasal cannula, as further quantified above, thereby meeting criteria for acute hypoxic respiratory distress as opposed to acute hypoxic respiratory failure at this time.  Appears to be on the basis of COVID-19 infection, as above, as was resultant acute COPD exacerbation. ACS is felt to be less likely at this time in the absence of any recent chest pain and in the context of presenting EKG showing no evidence of acute ischemic process. No clinical or radiographic evidence of suggest acutely decompensated heart failure at this time. While there is increased risk for acute PE in the setting of COVID-19 infection, clinically, this appears to be less likely at this time. Influenza PCR found to be negative when checked today.   Plan: further evaluation and management of presenting COVID-19 infection, as above, including monitoring of continuous pulse oximetry with prn supplemental O2 to maintain O2 sats greater than or equal to 94%. monitor on telemetry. Trending of inflammatory markers, as above. Check CMP and CBC in the morning. Check serum Mg and Phos levels. Check blood gas.  Scheduled duo nebulizer treatments, as needed albuterol nebulizers.  Systolic corticosteroids, as above.            #) Acute hypo-osmolar hyponatremia: Appears relatively euvolemic at this time, raising possibility of SIADH as a consequence of acute pulmonary infection in the form of presenting severe COVID-19 infection.  Not on a thiazide diuretics as an outpatient.  Of note, she also has history of acquired hypothyroidism, and will check TSH to further evaluate.  No evidence of acute focal neurologic deficits or report of recent trauma.  will refrain from provision of IVF's pending the result of random urine sodium and urine osmolality studies to provide additional insight into potential contributions from dehydration vs SIADH, as subsequent management would differ depending upon these results.    Plan: monitor strict I's and O's and daily weights.  check UA, random urine sodium, urine osmolality.  Check serum osmolality to confirm suspected hypoosmolar etiology.  Repeat CMP in the morning.  Check TSH. Check serum uric acid level, as SIADH can be associated with hypouricemia due to hyperuricuria.  Add on BNP.  Check INR.          #) Chronic combined systolic and diastolic heart failure: documented history of such, with most recent echocardiogram performed June 2022 notable for LVEF 40 to 45% as well as grade 1 diastolic dysfunction with additional details as conveyed above.  No clinical signs radiographic evidence to suggest acutely decompensated heart failure at this time.  patient's home diuretic regimen reportedly consists of the following: Lasix 20 mg p.o. daily , with additional home cardiac medications notable for the following: Hydralazine, Imdur.  The context of presenting severe sepsis as well as increased risk for an afterload dependent pathophysiology in the setting of moderate aortic stenosis, will hold home antihypertensive medications for now, with plan to reevaluate timing of the resumption of the morning.  Plan: monitor strict I's & O's and daily weights. Repeat CMP in the morning. Check serum magnesium level.  Hold home diuretic regimen, Imdur, hydralazine for now, with reassessment of volume and hemodynamic status to occur in the morning, as above.  Add on BNP.         #) CKD Stage 3B: Documented history of such, with baseline creatinine 1.2-1.4, with presenting creatinine consistent with this baseline.    Plan: Monitor strict I's and O's and daily weights.  Attempt to avoid nephrotoxic agents.  CMP/magnesium level in the AM.            #) Hyperlipidemia: documented h/o such. On high intensity atorvastatin as outpatient.  In the setting of initiation of remdesivir, will hold statin for now.   Plan: Hold home statin, as above..              #) acquired hypothyroidism: documented h/o such, on Synthroid as outpatient.   Plan: cont home Synthroid.  Check TSH in the setting of acute hyponatremia.            #) Acute on chronic  anemia: In the context of document history of anemia of chronic disease associated baseline hemoglobin range 10-12, presenting hemoglobin slightly lower at 9.3 and this otherwise generally euvolemic appearing patient, without significant suspected contribution from hemodilutional element.  No overt evidence of active bleed at this time, noting use of daily baby aspirin as her sole outpatient blood thinning agent.  Potentially an exacerbation as a consequence of anemia of acute illness in setting of worsening severe COVID-19 infection.  Will add on iron studies to evaluate for any optimize ability from the standpoint given her presenting new onset shortness of breath, as well as increased risk for acutely decompensated heart failure as a consequence of worsening anemia.  Plan add on iron studies.  Check INR.  Repeat CBC in the morning.  SCDs.  Add on BNP.  Monitor strict I's and O's and daily weights.      DVT prophylaxis: SCD's   Code Status: DNR/DNI confirmed via active MOST from and consistent with code status documentation from most recent prior hospitalization Family Communication: none Disposition Plan: Per Rounding Team Consults called: none;  Admission status: Inpatient   PLEASE NOTE THAT DRAGON DICTATION SOFTWARE WAS USED IN THE CONSTRUCTION OF THIS NOTE.   [place] DO [facility] [place]   03/28/2022, 8:06 PM

## 2022-03-28 NOTE — ED Provider Notes (Signed)
[facility] DEPT Provider Note   CSN: [id number] Arrival date & time: 03/28/22  1408     History  Chief Complaint  Patient presents with   Shortness of Breath    [name] is a 88 y.o. female.  Patient has a history of hypertension COPD congestive heart failure.  She has had a cough and shortness of breath for a few days.  She had a positive COVID test yesterday.   Shortness of Breath      Home Medications Prior to Admission medications   Medication Sig Start Date End Date Taking? Authorizing Provider  albuterol (PROAIR HFA) 108 (90 Base) MCG/ACT inhaler Inhale 2 puffs into the lungs every 6 (six) hours as needed for wheezing or shortness of breath. 03/19/22  Yes Mannam, Praveen, MD  ALPRAZolam (XANAX) 0.5 MG tablet TAKE 1 TABLET BY MOUTH 3 TIMES DAILY. Patient taking differently: Take 0.25 mg by mouth 3 (three) times daily as needed for anxiety. 12/18/21  Yes Nafziger, Tommi Rumps, NP  amLODipine (NORVASC) 5 MG tablet TAKE 1 TABLET (5 MG TOTAL) BY MOUTH DAILY. 11/26/21  Yes Skeet Latch, MD  aspirin EC 81 MG EC tablet Take 1 tablet (81 mg total) by mouth daily. Swallow whole. Patient taking differently: Take 81 mg by mouth daily. 12/28/20  Yes Enzo Bi, MD  atorvastatin (LIPITOR) 80 MG tablet TAKE 1 TABLET BY MOUTH EVERY DAY AT 6PM Patient taking differently: Take 80 mg by mouth daily at 6 PM. 02/22/22  Yes Skeet Latch, MD  Fluticasone-Umeclidin-Vilant (TRELEGY ELLIPTA) 100-62.5-25 MCG/ACT AEPB Inhale 1 puff into the lungs daily. 09/19/21  Yes Mannam, Praveen, MD  furosemide (LASIX) 20 MG tablet TAKE 1 TABLET BY MOUTH EVERY DAY 11/26/21  Yes Skeet Latch, MD  hydrALAZINE (APRESOLINE) 50 MG tablet TAKE 1 TABLET BY MOUTH THREE TIMES A DAY Patient taking differently: Take 50 mg by mouth 3 (three) times daily. 11/26/21  Yes Skeet Latch, MD  isosorbide mononitrate (IMDUR) 30 MG 24 hr tablet TAKE 1 TABLET BY MOUTH EVERY DAY Patient taking  differently: Take 30 mg by mouth daily. 11/26/21  Yes Skeet Latch, MD  KLOR-CON M20 20 MEQ tablet TAKE 1 TABLET BY MOUTH EVERY DAY Patient taking differently: Take 20 mEq by mouth daily. 11/26/21  Yes Skeet Latch, MD  latanoprost (XALATAN) 0.005 % ophthalmic solution Place 1 drop into both eyes at bedtime.  04/02/19  Yes [provider]  levothyroxine (SYNTHROID) 75 MCG tablet TAKE 1 TABLET BY MOUTH DAILY BEFORE BREAKFAST. Patient taking differently: Take 75 mcg by mouth daily before breakfast. 04/23/21  Yes Nafziger, Tommi Rumps, NP  pantoprazole (PROTONIX) 40 MG tablet Take 1 tablet (40 mg total) by mouth daily. SCHEDULE APPT FOR FUTURE REFILLS 01/28/22  Yes Nafziger, Tommi Rumps, NP  predniSONE (DELTASONE) 5 MG tablet Take 1 tablet (5 mg total) by mouth daily with breakfast. 02/11/22  Yes Mannam, Praveen, MD  Cyanocobalamin (B-12 PO) Take 1 tablet by mouth daily. Patient not taking: Reported on 03/28/2022    [provider]  guaiFENesin (MUCINEX) 600 MG 12 hr tablet Take by mouth every 12 (twelve) hours. For 7 days Patient not taking: Reported on 03/28/2022    [provider]  ipratropium-albuterol (DUONEB) 0.5-2.5 (3) MG/3ML SOLN Take 3 mLs by nebulization every 6 (six) hours as needed (copd). Patient not taking: Reported on 03/28/2022 03/19/22   Marshell Garfinkel, MD  molnupiravir EUA (LAGEVRIO) 200 MG CAPS capsule Take 4 capsules by mouth 2 (two) times daily. For 5 days Patient  not taking: Reported on 03/28/2022    [provider]      Allergies    Catapres [clonidine hcl], Clonidine, Lisinopril, Labetalol, and Labetalol hcl    Review of Systems   Review of Systems  Respiratory:  Positive for shortness of breath.     Physical Exam Updated Vital Signs BP 133/78   Pulse 72   Temp 98.7 F (37.1 C) (Oral)   Resp 16   Ht '5\' 6"'$  (1.676 m)   Wt 59.4 kg   SpO2 94%   BMI 21.14 kg/m  Physical Exam  ED Results / Procedures / Treatments   Labs (all labs ordered  are listed, but only abnormal results are displayed) Labs Reviewed  RESP PANEL BY RT-PCR (FLU A&B, COVID) ARPGX2 - Abnormal; Notable for the following components:      Result Value   SARS Coronavirus 2 by RT PCR POSITIVE (*)    All other components within normal limits  COMPREHENSIVE METABOLIC PANEL - Abnormal; Notable for the following components:   Sodium 131 (*)    Glucose, Bld 195 (*)    BUN 24 (*)    Creatinine, Ser 1.18 (*)    Calcium 7.9 (*)    Total Protein 6.0 (*)    Albumin 3.2 (*)    GFR, Estimated 43 (*)    All other components within normal limits  CBC - Abnormal; Notable for the following components:   WBC 2.9 (*)    RBC 3.57 (*)    Hemoglobin 9.3 (*)    HCT 30.7 (*)    RDW 16.2 (*)    All other components within normal limits  DIFFERENTIAL - Abnormal; Notable for the following components:   Lymphs Abs 0.6 (*)    All other components within normal limits    EKG None  Radiology DG Chest Port 1 View  Result Date: 03/28/2022 CLINICAL DATA:  Short of breath, weakness, possible COVID EXAM: PORTABLE CHEST 1 VIEW COMPARISON:  Prior chest x-ray 02/27/2021 FINDINGS: Stable mild cardiomegaly. Atherosclerotic calcifications again noted throughout the thoracic aorta. Background bronchitic changes are stable. Left lower lobe patchy airspace opacity also appears similar compared to prior. Linear atelectasis versus scarring in the right lung base. No new airspace opacity, pulmonary edema or pneumothorax. No acute osseous abnormality. Degenerative changes at both glenohumeral joints. IMPRESSION: Stable chest x-ray without evidence of acute cardiopulmonary process. Electronically Signed   By: Jacqulynn Cadet M.D.   On: 03/28/2022 15:33    Procedures Procedures    Medications Ordered in ED Medications  ipratropium-albuterol (DUONEB) 0.5-2.5 (3) MG/3ML nebulizer solution 3 mL (3 mLs Nebulization Given 03/28/22 1610)  albuterol (PROVENTIL) (2.5 MG/3ML) 0.083% nebulizer solution  2.5 mg (2.5 mg Nebulization Given 03/28/22 1610)  magnesium sulfate IVPB 2 g 50 mL (0 g Intravenous Stopped 03/28/22 1707)    ED Course/ Medical Decision Making/ A&P                           Medical Decision Making Risk Prescription drug management. Decision regarding hospitalization.   Patient with hypoxia secondary to COVID.  She will be admitted to medicine        Final Clinical Impression(s) / ED Diagnoses Final diagnoses:  COVID    Rx / DC Orders ED Discharge Orders     None         Milton Ferguson, MD 03/29/22 1252

## 2022-03-28 NOTE — Progress Notes (Signed)
Pt's daughter answered questions on nursing admission history. She states she gave pt her bedtime medications when she ate supper. Explained to daughter that pt's rn will be giving medications that doctor orders. Daughter states pt has anxiety. She has not told her that she has Covid. She states that pt does not know she has covid and if she finds out she will become very anxious. Doroteo Bradford BSN, RN-BC Throughput Nurse 03/28/2022. 8:14 PM

## 2022-03-28 NOTE — ED Triage Notes (Signed)
BIBA Per EMS: Pt coming from [facility]. Covid pos. SHOB, wheezing.  duoneb given en Route  22 L FA  125 solumedrol

## 2022-03-28 NOTE — Telephone Encounter (Signed)
Pt daughter is asking for supplemental o2 and to increase her prednisone. As the pt is covid (+) and just moved into an assisted living. Which is [name]. Please advise  [name] F) [phone]

## 2022-03-28 NOTE — Telephone Encounter (Signed)
Called and spoke to [place]. [name] states pt has tested positive for COVID on 9/20. Pt has increase in ShOB, non prod cough, chest congestion. [name] denies pt having chest discomfort, f/c/s, wheezing. Pt is taking '5mg'$  of pred. [name] is questioning if an antiviral can be scripted (if so, script needs to be faxed to facility; [phone]). [name] is also questioning if pt needs to increase her daily pred.    Dr. Vaughan Browner, please advise. Thanks.

## 2022-03-28 NOTE — Telephone Encounter (Signed)
Pt daughter is calling stating that the pt is now headed to the hospital and that her o2 stats dropped to 30 and that the md onsite at the care facility instructed the daughter to take her to the ER.

## 2022-03-29 DIAGNOSIS — E871 Hypo-osmolality and hyponatremia: Secondary | ICD-10-CM | POA: Diagnosis not present

## 2022-03-29 DIAGNOSIS — U071 COVID-19: Secondary | ICD-10-CM | POA: Diagnosis not present

## 2022-03-29 DIAGNOSIS — J9601 Acute respiratory failure with hypoxia: Secondary | ICD-10-CM | POA: Diagnosis not present

## 2022-03-29 DIAGNOSIS — D638 Anemia in other chronic diseases classified elsewhere: Secondary | ICD-10-CM | POA: Diagnosis not present

## 2022-03-29 LAB — CBC WITH DIFFERENTIAL/PLATELET
Abs Immature Granulocytes: 0.01 10*3/uL (ref 0.00–0.07)
Basophils Absolute: 0 10*3/uL (ref 0.0–0.1)
Basophils Relative: 0 %
Eosinophils Absolute: 0 10*3/uL (ref 0.0–0.5)
Eosinophils Relative: 0 %
HCT: 30.6 % — ABNORMAL LOW (ref 36.0–46.0)
Hemoglobin: 9.1 g/dL — ABNORMAL LOW (ref 12.0–15.0)
Immature Granulocytes: 1 %
Lymphocytes Relative: 15 %
Lymphs Abs: 0.2 10*3/uL — ABNORMAL LOW (ref 0.7–4.0)
MCH: 25.9 pg — ABNORMAL LOW (ref 26.0–34.0)
MCHC: 29.7 g/dL — ABNORMAL LOW (ref 30.0–36.0)
MCV: 86.9 fL (ref 80.0–100.0)
Monocytes Absolute: 0 10*3/uL — ABNORMAL LOW (ref 0.1–1.0)
Monocytes Relative: 2 %
Neutro Abs: 1.1 10*3/uL — ABNORMAL LOW (ref 1.7–7.7)
Neutrophils Relative %: 82 %
Platelets: 150 10*3/uL (ref 150–400)
RBC: 3.52 MIL/uL — ABNORMAL LOW (ref 3.87–5.11)
RDW: 16.1 % — ABNORMAL HIGH (ref 11.5–15.5)
WBC: 1.4 10*3/uL — CL (ref 4.0–10.5)
nRBC: 0 % (ref 0.0–0.2)

## 2022-03-29 LAB — COMPREHENSIVE METABOLIC PANEL
ALT: 30 U/L (ref 0–44)
AST: 34 U/L (ref 15–41)
Albumin: 3.1 g/dL — ABNORMAL LOW (ref 3.5–5.0)
Alkaline Phosphatase: 76 U/L (ref 38–126)
Anion gap: 9 (ref 5–15)
BUN: 26 mg/dL — ABNORMAL HIGH (ref 8–23)
CO2: 24 mmol/L (ref 22–32)
Calcium: 8 mg/dL — ABNORMAL LOW (ref 8.9–10.3)
Chloride: 104 mmol/L (ref 98–111)
Creatinine, Ser: 1.22 mg/dL — ABNORMAL HIGH (ref 0.44–1.00)
GFR, Estimated: 41 mL/min — ABNORMAL LOW (ref >60)
Glucose, Bld: 189 mg/dL — ABNORMAL HIGH (ref 70–99)
Potassium: 3.5 mmol/L (ref 3.5–5.1)
Sodium: 137 mmol/L (ref 135–145)
Total Bilirubin: 0.9 mg/dL (ref 0.3–1.2)
Total Protein: 6 g/dL — ABNORMAL LOW (ref 6.5–8.1)

## 2022-03-29 LAB — LACTIC ACID, PLASMA: Lactic Acid, Venous: 2.1 mmol/L (ref 0.5–1.9)

## 2022-03-29 LAB — BLOOD GAS, VENOUS
Acid-Base Excess: 2.2 mmol/L — ABNORMAL HIGH (ref 0.0–2.0)
Bicarbonate: 27.8 mmol/L (ref 20.0–28.0)
O2 Saturation: 84.5 %
Patient temperature: 37
pCO2, Ven: 46 mmHg (ref 44–60)
pH, Ven: 7.39 (ref 7.25–7.43)
pO2, Ven: 55 mmHg — ABNORMAL HIGH (ref 32–45)

## 2022-03-29 LAB — C-REACTIVE PROTEIN
CRP: 2.8 mg/dL — ABNORMAL HIGH (ref <1.0)
CRP: 2.7 mg/dL — ABNORMAL HIGH (ref <1.0)

## 2022-03-29 LAB — OSMOLALITY: Osmolality: 294 mOsm/kg (ref 275–295)

## 2022-03-29 LAB — PHOSPHORUS: Phosphorus: 3.9 mg/dL (ref 2.5–4.6)

## 2022-03-29 LAB — MAGNESIUM: Magnesium: 2.6 mg/dL — ABNORMAL HIGH (ref 1.7–2.4)

## 2022-03-29 MED ORDER — ENOXAPARIN SODIUM 30 MG/0.3ML IJ SOSY
30.0000 mg | PREFILLED_SYRINGE | INTRAMUSCULAR | Status: DC
  Administered 2022-03-29 – 2022-04-02 (×5): 30 mg via SUBCUTANEOUS
  Filled 2022-03-29 (×5): qty 0.3

## 2022-03-29 MED ORDER — IPRATROPIUM-ALBUTEROL 0.5-2.5 (3) MG/3ML IN SOLN
3.0000 mL | Freq: Two times a day (BID) | RESPIRATORY_TRACT | Status: DC
  Administered 2022-03-29 – 2022-03-30 (×2): 3 mL via RESPIRATORY_TRACT
  Filled 2022-03-29 (×2): qty 3

## 2022-03-29 MED ORDER — IPRATROPIUM-ALBUTEROL 0.5-2.5 (3) MG/3ML IN SOLN
3.0000 mL | Freq: Three times a day (TID) | RESPIRATORY_TRACT | Status: DC
  Administered 2022-03-29: 3 mL via RESPIRATORY_TRACT
  Filled 2022-03-29: qty 3

## 2022-03-29 MED ORDER — ALPRAZOLAM 0.25 MG PO TABS
0.2500 mg | ORAL_TABLET | Freq: Three times a day (TID) | ORAL | Status: DC | PRN
  Administered 2022-03-29 – 2022-04-03 (×7): 0.25 mg via ORAL
  Filled 2022-03-29 (×7): qty 1

## 2022-03-29 NOTE — Consult Note (Signed)
Consultation Note Date: 03/29/2022   Patient Name: [name]  DOB: [birth year]  MRN: [id number]  Age / Sex: 88 y.o., female  PCP: [name], NP Referring Physician: Hosie Poisson, MD  Reason for Consultation: Establishing goals of care  HPI/Patient Profile: 88 y.o. female   admitted on 03/28/2022    Clinical Assessment and Goals of Care: 88 year old female who was living by herself and recently, about a week ago moved into [facility] assisted living facility.  Her daughter [name] is her healthcare power of attorney agent.  Patient has past medical history of COPD is on chronic steroids, chronic systolic and diastolic heart failure, also has moderate aortic stenosis stage III chronic kidney disease with baseline creatinine 1.2-1.4 history of hypertension dyslipidemia and hypothyroidism, admitted to hospital medicine service at [facility] with COVID-19 infection. Patient placed on precautions, placed on doxycycline and remdesivir.  He is on supplemental oxygen and also breathing treatments.  Palliative consultation for goals of care discussions has been requested. HCPOA Daughter Jose Persia at [phone]  SUMMARY OF RECOMMENDATIONS   Goals of care discussions: Met with the patient and daughter [name] was also in the room.  Introduced myself and palliative care as follows: Palliative medicine is specialized medical care for people living with serious illness. It focuses on providing relief from the symptoms and stress of a serious illness. The goal is to improve quality of life for both the patient and the family. Goals of care: Broad aims of medical therapy in relation to the patient's values and preferences. Our aim is to provide medical care aimed at enabling patients to achieve the goals that matter most to them, given the circumstances of their particular medical situation and their constraints.    Brief life review performed.  Goals wishes and values attempted to be explored.  Patient has been seen by palliative care once in the outpatient setting.  Discussed about scope of current hospitalization.  Advance care planning documents noted on the patient's chart.  Agree with DNR, continue current mode of care, recommend palliative care start following the patient at [facility] assisted living facility.  Thank you for the consult.  Code Status/Advance Care Planning: DNR   Symptom Management:     Palliative Prophylaxis:  Delirium Protocol   Psycho-social/Spiritual:  Desire for further Chaplaincy support:yes Additional Recommendations: Caregiving  Support/Resources  Prognosis:  Unable to determine  Discharge Planning:  recommend palliative care in the outpatient setting at the patient's [name] assisted [facility] facility.       Primary Diagnoses: Present on Admission:  COVID-19 virus infection  Acute respiratory failure with hypoxia (HCC)  Chronic combined systolic and diastolic heart failure (HCC)  COPD with acute exacerbation (HCC)  Acute hyponatremia  Severe sepsis (HCC)  Stage 3b chronic kidney disease (CKD) (HCC)  HLD (hyperlipidemia)  Hypothyroidism  Anemia of chronic disease   I have reviewed the medical record, interviewed the patient and family, and examined the patient. The following aspects are pertinent.  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   HYPERLIPIDEMIA 03/06/2007   HYPERTENSION 03/06/2007   HYPOTHYROIDISM  10/13/2007   OSTEOARTHRITIS 03/06/2007   Personal history of colonic polyps 09/30/2008   Stroke ([place])    06/2014            TOBACCO USE, QUIT 04/12/2009   WEAKNESS 11/09/2007   Social History   Socioeconomic History   Marital status: Divorced    Spouse name: Not on file   Number of children: 4   Years of education: college   Highest education level: Not on file  Occupational History   Occupation: retired  Tobacco Use   Smoking status: Former    Packs/day: 1.50    Years: 29.00    Total pack years: 43.50    Types: Cigarettes    Start date: 07/08/1949    Quit date: 07/08/1978    Years since quitting: 43.7   Smokeless tobacco: Never  Vaping Use   Vaping Use: Never used  Substance and Sexual Activity   Alcohol use: Yes    Alcohol/week: 1.0 standard drink of alcohol    Types: 1 Glasses of wine per week    Comment: "red wine"- some nights   Drug use: No   Sexual activity: Not Currently  Other Topics Concern   Not on file  Social History Narrative      Patient is right handed.   Patient drinks 1 cup caffeine daily.   Social Determinants of Health   Financial Resource Strain: Not on file  Food Insecurity: No Food Insecurity (03/28/2022)   Hunger Vital Sign    Worried About Running Out of Food in the Last Year: Never true    Ran Out of Food in the Last Year: Never true  Transportation Needs: No Transportation Needs (03/28/2022)   PRAPARE - Hydrologist (Medical): No    Lack of Transportation (Non-Medical): No  Physical Activity: Not on file  Stress: Not on file  Social Connections: Not on file   Family History  Problem Relation Age of Onset   Stomach cancer Maternal Grandmother    Colon cancer Neg Hx    Scheduled Meds:  ipratropium-albuterol  3 mL Nebulization BID   latanoprost  1 drop Both Eyes QHS   levothyroxine  75 mcg Oral QAC breakfast   methylPREDNISolone (SOLU-MEDROL) injection  125 mg Intravenous Daily   Followed by   Derrill Memo ON  03/31/2022] predniSONE  50 mg Oral Daily   pantoprazole  40 mg Oral Daily   Continuous Infusions:  doxycycline (VIBRAMYCIN) IV 100 mg (03/29/22 0839)   remdesivir 100 mg in sodium chloride 0.9 % 100 mL IVPB 100 mg (03/29/22 1028)   PRN Meds:.acetaminophen **OR** acetaminophen, albuterol Medications Prior to Admission:  Prior to Admission medications   Medication Sig Start Date End Date Taking? Authorizing Provider  albuterol (PROAIR HFA) 108 (90 Base) MCG/ACT inhaler Inhale 2 puffs into the lungs every 6 (six) hours as needed for wheezing or shortness of breath. 03/19/22  Yes Mannam, Hart Robinsons, MD  ALPRAZolam Duanne Moron) 0.5  MG tablet TAKE 1 TABLET BY MOUTH 3 TIMES DAILY. Patient taking differently: Take 0.25 mg by mouth 3 (three) times daily as needed for anxiety. 12/18/21  Yes Nafziger, Tommi Rumps, NP  amLODipine (NORVASC) 5 MG tablet TAKE 1 TABLET (5 MG TOTAL) BY MOUTH DAILY. 11/26/21  Yes Skeet Latch, MD  aspirin EC 81 MG EC tablet Take 1 tablet (81 mg total) by mouth daily. Swallow whole. Patient taking differently: Take 81 mg by mouth daily. 12/28/20  Yes Enzo Bi, MD  atorvastatin (LIPITOR) 80 MG tablet TAKE 1 TABLET BY MOUTH EVERY DAY AT 6PM Patient taking differently: Take 80 mg by mouth daily at 6 PM. 02/22/22  Yes Skeet Latch, MD  Fluticasone-Umeclidin-Vilant (TRELEGY ELLIPTA) 100-62.5-25 MCG/ACT AEPB Inhale 1 puff into the lungs daily. 09/19/21  Yes Mannam, Praveen, MD  furosemide (LASIX) 20 MG tablet TAKE 1 TABLET BY MOUTH EVERY DAY 11/26/21  Yes Skeet Latch, MD  hydrALAZINE (APRESOLINE) 50 MG tablet TAKE 1 TABLET BY MOUTH THREE TIMES A DAY Patient taking differently: Take 50 mg by mouth 3 (three) times daily. 11/26/21  Yes Skeet Latch, MD  isosorbide mononitrate (IMDUR) 30 MG 24 hr tablet TAKE 1 TABLET BY MOUTH EVERY DAY Patient taking differently: Take 30 mg by mouth daily. 11/26/21  Yes Skeet Latch, MD  KLOR-CON M20 20 MEQ tablet TAKE 1 TABLET BY MOUTH EVERY  DAY Patient taking differently: Take 20 mEq by mouth daily. 11/26/21  Yes Skeet Latch, MD  latanoprost (XALATAN) 0.005 % ophthalmic solution Place 1 drop into both eyes at bedtime.  04/02/19  Yes [provider]  levothyroxine (SYNTHROID) 75 MCG tablet TAKE 1 TABLET BY MOUTH DAILY BEFORE BREAKFAST. Patient taking differently: Take 75 mcg by mouth daily before breakfast. 04/23/21  Yes Nafziger, Tommi Rumps, NP  pantoprazole (PROTONIX) 40 MG tablet Take 1 tablet (40 mg total) by mouth daily. SCHEDULE APPT FOR FUTURE REFILLS 01/28/22  Yes Nafziger, Tommi Rumps, NP  predniSONE (DELTASONE) 5 MG tablet Take 1 tablet (5 mg total) by mouth daily with breakfast. 02/11/22  Yes Mannam, Praveen, MD  Cyanocobalamin (B-12 PO) Take 1 tablet by mouth daily. Patient not taking: Reported on 03/28/2022    [provider]  guaiFENesin (MUCINEX) 600 MG 12 hr tablet Take by mouth every 12 (twelve) hours. For 7 days Patient not taking: Reported on 03/28/2022    [provider]  ipratropium-albuterol (DUONEB) 0.5-2.5 (3) MG/3ML SOLN Take 3 mLs by nebulization every 6 (six) hours as needed (copd). Patient not taking: Reported on 03/28/2022 03/19/22   Marshell Garfinkel, MD  molnupiravir EUA (LAGEVRIO) 200 MG CAPS capsule Take 4 capsules by mouth 2 (two) times daily. For 5 days Patient not taking: Reported on 03/28/2022    [provider]   Allergies  Allergen Reactions   Catapres [Clonidine Hcl] Other (See Comments)    Made pt feel horrible, shaky, weak and nausea. Not listed on MAR   Clonidine Other (See Comments)    Made pt feel horrible, shaky, weak and nausea. Not listed on MAR   Lisinopril Anaphylaxis    Tongue swelling. Not listed on MAR   Labetalol Other (See Comments)    Caused bradycardia and syncope. Not listed on MAR   Labetalol Hcl Other (See Comments)    Caused bradycardia and syncope. Not listed on MAR   Review of Systems Complains of generalized weakness Physical Exam Very lady  sitting up in bed in Patient is on some supplemental oxygen via nasal cannula currently with 96% O2 saturation on  3 L, is not on home oxygen Moist oral mucosa No edema Regular work of breathing S1-S2  Vital Signs: BP 125/66 (BP Location: Right Arm)   Pulse 64   Temp 97.9 F (36.6 C) (Oral)   Resp 16   Ht $R'5\' 6"'XW$  (1.676 m)   Wt 58.4 kg   SpO2 96%   BMI 20.78 kg/m  Pain Scale: 0-10   Pain Score: 0-No pain   SpO2: SpO2: 96 % O2 Device:SpO2: 96 % O2 Flow Rate: .O2 Flow Rate (L/min): 3 L/min  IO: Intake/output summary:  Intake/Output Summary (Last 24 hours) at 03/29/2022 1219 Last data filed at 03/29/2022 0600 Gross per 24 hour  Intake 290 ml  Output 0 ml  Net 290 ml    LBM: Last BM Date : 03/26/22 Baseline Weight: Weight: 59.4 kg Most recent weight: Weight: 58.4 kg     Palliative Assessment/Data:   PPS 40%  Time In:  11 Time Out:  12 Time Total:  60  Greater than 50%  of this time was spent counseling and coordinating care related to the above assessment and plan.  Signed by: Loistine Chance, MD   Please contact Palliative Medicine Team phone at [phone] for questions and concerns.  For individual provider: See Shea Evans

## 2022-03-29 NOTE — Progress Notes (Signed)
PROGRESS NOTE    [name]  DSK:[id number] DOB: 02/26/1925 DOA: 03/28/2022 PCP: [name], NP    Chief Complaint  Patient presents with   Shortness of Breath    Brief Narrative:   [name] is a 88 y.o. female with medical history significant for severe COPD on chronic corticosteroid therapy, chronic systolic/diastolic heart failure, moderate aortic stenosis, chronic kidney disease stage IIIb with baseline creatinine range 1.2-1.4, hypertension, hyperlipidemia, acquired hypothyroidism, who is admitted to [facility] on 03/28/2022 with severe COVID-19 infection after presenting from SNF to [facility] ED complaining of shortness of breath.   She underwent COVID-19 testing at SNF on 03/27/22, which was found to be positive.  In the setting of further progression of the above respiratory symptoms over the course the next day, she presents to [facility]. emergency department sooner for further evaluation management of the above.   The patient was admitted for further evaluation management of severe IOMBT-59 infection complicated by acute hypoxic respiratory distress as well as acute COPD exacerbation, criteria met for SIRS and presenting labs notable for acute hyponatremia.  Assessment & Plan:   Principal Problem:   COVID-19 virus infection Active Problems:   Hypothyroidism   Anemia of chronic disease   HLD (hyperlipidemia)   Stage 3b chronic kidney disease (CKD) (HCC)   COPD with acute exacerbation (HCC)   Acute respiratory failure with hypoxia (HCC)   Chronic combined systolic and diastolic heart failure (HCC)   Acute hyponatremia   Severe sepsis (HCC)   Acute respiratory failure with hypoxia requiring about 3 L of nasal cannula oxygen probably secondary to COVID-19 infection.,  With underlying COPD exacerbation. Patient's PCR is negative Chest x-ray does not show any acute cardiopulmonary disease at this time. Patient received IV Solu-Medrol, IV remdesivir and IV  doxycycline. No wheezing heard on exam and she appears to be improving she denies any cough. Continue to follow inflammatory markers. Continue with DuoNebs, flutter valve and incentive spirometry. Procalcitonin is negative, CRP is 2.7 and lactic acid is 2.1. Follow blood cultures    SIRS criteria with leukopenia, tachycardia and tachypnea met on admission Continue to monitor    Hyponatremia Appears to have resolved at this time.    Anemia of chronic disease/iron deficiency anemia Hemoglobin stable around 9.    Neutropenia probably secondary to infection    Stage IIIb CKD creatinine appears to be at baseline at this time.    Hypothyroidism Continue with Synthroid at 75 mcg daily.   DVT prophylaxis: Lovenox.  Code Status: DNR  Family Communication: dAUGHTER AT BEDSIDE Disposition:   Status is: Inpatient Remains inpatient appropriate because:    Level of care: Progressive Consultants:  Palliative care.   Procedures: none.   Antimicrobials:  Antibiotics Given (last 72 hours)     Date/Time Action Medication Dose Rate   03/28/22 2321 New Bag/Given   remdesivir 200 mg in sodium chloride 0.9% 250 mL IVPB 200 mg 580 mL/hr   03/28/22 2354 New Bag/Given   doxycycline (VIBRAMYCIN) 100 mg in sodium chloride 0.9 % 250 mL IVPB 100 mg 125 mL/hr   03/29/22 0839 New Bag/Given   doxycycline (VIBRAMYCIN) 100 mg in sodium chloride 0.9 % 250 mL IVPB 100 mg 125 mL/hr   03/29/22 1028 New Bag/Given   remdesivir 100 mg in sodium chloride 0.9 % 100 mL IVPB 100 mg 200 mL/hr         Subjective: Breathing better.   Objective: Vitals:   03/29/22 0303 03/29/22 0500 03/29/22  0736 03/29/22 1328  BP: 125/66   (!) 152/68  Pulse: 64   63  Resp: 16   20  Temp: 97.9 F (36.6 C)   98.5 F (36.9 C)  TempSrc: Oral   Oral  SpO2: 97%  96% 97%  Weight:  58.4 kg    Height:        Intake/Output Summary (Last 24 hours) at 03/29/2022 1356 Last data filed at 03/29/2022 0600 Gross  per 24 hour  Intake 290 ml  Output 0 ml  Net 290 ml   Filed Weights   03/28/22 1421 03/28/22 2219 03/29/22 0500  Weight: 59.4 kg 60 kg 58.4 kg    Examination:  General exam: ELDERLY woman, ill appearing, on 3 lit of [place] oxygen , not in distress.  Respiratory system: scattered rhonchi, no wheezingheard.  Cardiovascular system: S1 & S2 heard, RRR. No JVD,  No pedal edema. Gastrointestinal system: Abdomen is nondistended, soft and nontender. Normal bowel sounds heard. Central nervous system: Alert and oriented to place and person, grossly non focal.  Extremities: Symmetric 5 x 5 power. Skin: No rashes, lesions or ulcers Psychiatry:  Mood & affect appropriate.     Data Reviewed: I have personally reviewed following labs and imaging studies  CBC: Recent Labs  Lab 03/28/22 1501 03/29/22 0119  WBC 2.9* 1.4*  NEUTROABS 1.9 1.1*  HGB 9.3* 9.1*  HCT 30.7* 30.6*  MCV 86.0 86.9  PLT 159 354    Basic Metabolic Panel: Recent Labs  Lab 03/28/22 1501 03/29/22 0119  NA 131* 137  K 3.7 3.5  CL 98 104  CO2 25 24  GLUCOSE 195* 189*  BUN 24* 26*  CREATININE 1.18* 1.22*  CALCIUM 7.9* 8.0*  MG 2.0 2.6*  PHOS  --  3.9    GFR: Estimated Creatinine Clearance: 26 mL/min (A) (by C-G formula based on SCr of 1.22 mg/dL (H)).  Liver Function Tests: Recent Labs  Lab 03/28/22 1501 03/29/22 0119  AST 34 34  ALT 26 30  ALKPHOS 76 76  BILITOT 0.7 0.9  PROT 6.0* 6.0*  ALBUMIN 3.2* 3.1*    CBG: No results for input(s): "GLUCAP" in the last 168 hours.   Recent Results (from the past 240 hour(s))  Resp Panel by RT-PCR (Flu A&B, Covid) Anterior Nasal Swab     Status: Abnormal   Collection Time: 03/28/22  2:57 PM   Specimen: Anterior Nasal Swab  Result Value Ref Range Status   SARS Coronavirus 2 by RT PCR POSITIVE (A) NEGATIVE Final    Comment: (NOTE) SARS-CoV-2 target nucleic acids are DETECTED.  The SARS-CoV-2 RNA is generally detectable in upper respiratory specimens  during the acute phase of infection. Positive results are indicative of the presence of the identified virus, but do not rule out bacterial infection or co-infection with other pathogens not detected by the test. Clinical correlation with patient history and other diagnostic information is necessary to determine patient infection status. The expected result is Negative.  Fact Sheet for Patients: EntrepreneurPulse.com.au  Fact Sheet for Healthcare Providers: IncredibleEmployment.be  This test is not yet approved or cleared by the [place] FDA and  has been authorized for detection and/or diagnosis of SARS-CoV-2 by FDA under an Emergency Use Authorization (EUA).  This EUA will remain in effect (meaning this test can be used) for the duration of  the COVID-19 declaration under Section 564(b)(1) of the A ct, 21 U.S.C. section 360bbb-3(b)(1), unless the authorization is terminated or revoked sooner.  Influenza A by PCR NEGATIVE NEGATIVE Final   Influenza B by PCR NEGATIVE NEGATIVE Final    Comment: (NOTE) The Xpert Xpress SARS-CoV-2/FLU/RSV plus assay is intended as an aid in the diagnosis of influenza from Nasopharyngeal swab specimens and should not be used as a sole basis for treatment. Nasal washings and aspirates are unacceptable for Xpert Xpress SARS-CoV-2/FLU/RSV testing.  Fact Sheet for Patients: EntrepreneurPulse.com.au  Fact Sheet for Healthcare Providers: IncredibleEmployment.be  This test is not yet approved or cleared by the [place] FDA and has been authorized for detection and/or diagnosis of SARS-CoV-2 by FDA under an Emergency Use Authorization (EUA). This EUA will remain in effect (meaning this test can be used) for the duration of the COVID-19 declaration under Section 564(b)(1) of the Act, 21 U.S.C. section 360bbb-3(b)(1), unless the authorization is terminated  or revoked.  Performed at [facility], [place] [address], [place], [place] [address]   Culture, blood (Routine X 2) w Reflex to ID Panel     Status: None (Preliminary result)   Collection Time: 03/28/22 10:36 PM   Specimen: BLOOD  Result Value Ref Range Status   Specimen Description   Final    BLOOD RIGHT ANTECUBITAL Performed at [facility] [address], [place], [place] [address]    Special Requests   Final    BOTTLES DRAWN AEROBIC ONLY Blood Culture adequate volume Performed at [facility] [address], [place], [place] [address]    Culture   Final    NO GROWTH < 12 HOURS Performed at [facility] [address] [place], [place] [address]    Report Status PENDING  Incomplete  Culture, blood (Routine X 2) w Reflex to ID Panel     Status: None (Preliminary result)   Collection Time: 03/28/22 10:41 PM   Specimen: BLOOD  Result Value Ref Range Status   Specimen Description   Final    BLOOD RIGHT ANTECUBITAL Performed at [facility] [address] [place], [place] [address]    Special Requests   Final    BOTTLES DRAWN AEROBIC ONLY Blood Culture adequate volume Performed at [facility] [address], [place], [place] [address]    Culture   Final    NO GROWTH < 12 HOURS Performed at [facility] [address] [place], [place] [address]    Report Status PENDING  Incomplete         Radiology Studies: DG Chest Port 1 View  Result Date: 03/28/2022 CLINICAL DATA:  Short of breath, weakness, possible COVID EXAM: PORTABLE CHEST 1 VIEW COMPARISON:  Prior chest x-ray 02/27/2021 FINDINGS: Stable mild cardiomegaly. Atherosclerotic calcifications again noted throughout the thoracic aorta. Background bronchitic changes are stable. Left lower lobe patchy airspace opacity also appears similar compared to prior. Linear atelectasis versus scarring in the right lung  base. No new airspace opacity, pulmonary edema or pneumothorax. No acute osseous abnormality. Degenerative changes at both glenohumeral joints. IMPRESSION: Stable chest x-ray without evidence of acute cardiopulmonary process. Electronically Signed   By: Jacqulynn Cadet M.D.   On: 03/28/2022 15:33        Scheduled Meds:  ipratropium-albuterol  3 mL Nebulization BID   latanoprost  1 drop Both Eyes QHS   levothyroxine  75 mcg Oral QAC breakfast   methylPREDNISolone (SOLU-MEDROL) injection  125 mg Intravenous Daily   Followed by   Derrill Memo ON 03/31/2022] predniSONE  50 mg Oral Daily  pantoprazole  40 mg Oral Daily   Continuous Infusions:  doxycycline (VIBRAMYCIN) IV 100 mg (03/29/22 0839)   remdesivir 100 mg in sodium chloride 0.9 % 100 mL IVPB 100 mg (03/29/22 1028)     LOS: 1 day        Hosie Poisson, MD [facility]   To contact the attending provider between 7A-7P or the covering provider during after hours 7P-7A, please log into the web site [email or web address] and access using universal [place] password for that web site. If you do not have the password, please call the hospital operator.  03/29/2022, 1:56 PM

## 2022-03-30 DIAGNOSIS — U071 COVID-19: Secondary | ICD-10-CM | POA: Diagnosis not present

## 2022-03-30 DIAGNOSIS — D638 Anemia in other chronic diseases classified elsewhere: Secondary | ICD-10-CM | POA: Diagnosis not present

## 2022-03-30 DIAGNOSIS — E871 Hypo-osmolality and hyponatremia: Secondary | ICD-10-CM | POA: Diagnosis not present

## 2022-03-30 DIAGNOSIS — J9601 Acute respiratory failure with hypoxia: Secondary | ICD-10-CM | POA: Diagnosis not present

## 2022-03-30 LAB — CBC WITH DIFFERENTIAL/PLATELET
Abs Immature Granulocytes: 0.01 10*3/uL (ref 0.00–0.07)
Basophils Absolute: 0 10*3/uL (ref 0.0–0.1)
Basophils Relative: 0 %
Eosinophils Absolute: 0 10*3/uL (ref 0.0–0.5)
Eosinophils Relative: 0 %
HCT: 28.6 % — ABNORMAL LOW (ref 36.0–46.0)
Hemoglobin: 8.6 g/dL — ABNORMAL LOW (ref 12.0–15.0)
Immature Granulocytes: 0 %
Lymphocytes Relative: 15 %
Lymphs Abs: 0.7 10*3/uL (ref 0.7–4.0)
MCH: 26.4 pg (ref 26.0–34.0)
MCHC: 30.1 g/dL (ref 30.0–36.0)
MCV: 87.7 fL (ref 80.0–100.0)
Monocytes Absolute: 0.6 10*3/uL (ref 0.1–1.0)
Monocytes Relative: 13 %
Neutro Abs: 3.3 10*3/uL (ref 1.7–7.7)
Neutrophils Relative %: 72 %
Platelets: 152 10*3/uL (ref 150–400)
RBC: 3.26 MIL/uL — ABNORMAL LOW (ref 3.87–5.11)
RDW: 15.9 % — ABNORMAL HIGH (ref 11.5–15.5)
WBC: 4.7 10*3/uL (ref 4.0–10.5)
nRBC: 0 % (ref 0.0–0.2)

## 2022-03-30 LAB — BASIC METABOLIC PANEL
Anion gap: 6 (ref 5–15)
BUN: 38 mg/dL — ABNORMAL HIGH (ref 8–23)
CO2: 25 mmol/L (ref 22–32)
Calcium: 8.3 mg/dL — ABNORMAL LOW (ref 8.9–10.3)
Chloride: 108 mmol/L (ref 98–111)
Creatinine, Ser: 1.14 mg/dL — ABNORMAL HIGH (ref 0.44–1.00)
GFR, Estimated: 45 mL/min — ABNORMAL LOW (ref >60)
Glucose, Bld: 112 mg/dL — ABNORMAL HIGH (ref 70–99)
Potassium: 3.6 mmol/L (ref 3.5–5.1)
Sodium: 139 mmol/L (ref 135–145)

## 2022-03-30 LAB — PHOSPHORUS: Phosphorus: 3.9 mg/dL (ref 2.5–4.6)

## 2022-03-30 LAB — C-REACTIVE PROTEIN: CRP: 1.9 mg/dL — ABNORMAL HIGH (ref <1.0)

## 2022-03-30 LAB — MAGNESIUM: Magnesium: 2.5 mg/dL — ABNORMAL HIGH (ref 1.7–2.4)

## 2022-03-30 MED ORDER — FUROSEMIDE 20 MG PO TABS
20.0000 mg | ORAL_TABLET | Freq: Once | ORAL | Status: AC
  Administered 2022-03-30: 20 mg via ORAL
  Filled 2022-03-30: qty 1

## 2022-03-30 MED ORDER — CARBAMIDE PEROXIDE 6.5 % OT SOLN
5.0000 [drp] | Freq: Two times a day (BID) | OTIC | Status: DC
  Administered 2022-03-30 – 2022-04-01 (×2): 5 [drp] via OTIC
  Filled 2022-03-30: qty 15

## 2022-03-30 MED ORDER — DOXYCYCLINE HYCLATE 100 MG PO TABS
100.0000 mg | ORAL_TABLET | Freq: Two times a day (BID) | ORAL | Status: DC
  Administered 2022-03-30 – 2022-04-03 (×8): 100 mg via ORAL
  Filled 2022-03-30 (×8): qty 1

## 2022-03-30 MED ORDER — IPRATROPIUM-ALBUTEROL 0.5-2.5 (3) MG/3ML IN SOLN
3.0000 mL | Freq: Four times a day (QID) | RESPIRATORY_TRACT | Status: DC | PRN
  Administered 2022-03-30: 3 mL via RESPIRATORY_TRACT
  Filled 2022-03-30: qty 3

## 2022-03-30 MED ORDER — ALBUTEROL SULFATE (2.5 MG/3ML) 0.083% IN NEBU
2.5000 mg | INHALATION_SOLUTION | RESPIRATORY_TRACT | Status: DC | PRN
  Administered 2022-03-31: 2.5 mg via RESPIRATORY_TRACT
  Filled 2022-03-30: qty 3

## 2022-03-30 MED ORDER — IPRATROPIUM-ALBUTEROL 0.5-2.5 (3) MG/3ML IN SOLN
3.0000 mL | Freq: Three times a day (TID) | RESPIRATORY_TRACT | Status: DC
  Administered 2022-03-30 – 2022-03-31 (×2): 3 mL via RESPIRATORY_TRACT
  Filled 2022-03-30 (×2): qty 3

## 2022-03-30 NOTE — Progress Notes (Signed)
PT Cancellation Note  Patient Details Name: XOCHILTH STANDISH MRN: [id number] DOB: [birth year]   Cancelled Treatment:    Reason Eval/Treat Not Completed: Fatigue/lethargy limiting ability to participate;Other (comment) - pt reports feeling badly today, declines any mobility attempt at this time and requests PT check back tomorrow. Per pt's daughter, pt did get up to bathroom this am.   Stacie Glaze, PT DPT Acute Rehabilitation Services Pager [phone]  Office [phone]    Louis Matte 03/30/2022, 11:39 AM

## 2022-03-30 NOTE — Progress Notes (Signed)
PROGRESS NOTE    [name]  CHE:[id number] DOB: [birth year] DOA: 03/28/2022 PCP: [name], NP    Chief Complaint  Patient presents with   Shortness of Breath    Brief Narrative:   [name] is a 88 y.o. female with medical history significant for severe COPD on chronic corticosteroid therapy, chronic systolic/diastolic heart failure, moderate aortic stenosis, chronic kidney disease stage IIIb with baseline creatinine range 1.2-1.4, hypertension, hyperlipidemia, acquired hypothyroidism, who is admitted to [facility] on 03/28/2022 with severe COVID-19 infection after presenting from SNF to [facility] ED complaining of shortness of breath.   She underwent COVID-19 testing at SNF on 03/27/22, which was found to be positive.  In the setting of further progression of the above respiratory symptoms over the course the next day, she presents to [facility]. emergency department sooner for further evaluation management of the above.   The patient was admitted for further evaluation management of severe NTIRW-43 infection complicated by acute hypoxic respiratory distress as well as acute COPD exacerbation, criteria met for SIRS and presenting labs notable for acute hyponatremia.  Assessment & Plan:   Principal Problem:   COVID-19 virus infection Active Problems:   Hypothyroidism   Anemia of chronic disease   HLD (hyperlipidemia)   Stage 3b chronic kidney disease (CKD) (HCC)   COPD with acute exacerbation (HCC)   Acute respiratory failure with hypoxia (HCC)   Chronic combined systolic and diastolic heart failure (HCC)   Acute hyponatremia   Severe sepsis (HCC)   Acute respiratory failure with hypoxia requiring about 3 L of nasal cannula oxygen probably secondary to COVID-19 infection.,  With underlying COPD exacerbation. Patient's PCR is negative Chest x-ray does not show any acute cardiopulmonary disease at this time. Patient started on  IV Solu-Medrol, IV remdesivir and IV  doxycycline.she is transitioned to oral prednisone, oral doxycycline, and complete the course of remdesevir.  Continue to follow inflammatory markers. Continue with DuoNebs, flutter valve and incentive spirometry. Procalcitonin is negative, CRP is 2.7  on admission to 1.9. lactic acid of 2.1. repeat lactic acid in am.  Follow blood cultures  Chronic systolic heart failure:  BNP elevated, on admission, will request echocardiogram .   SIRS criteria with leukopenia, tachycardia and tachypnea met on admission Continue to monitor    Hyponatremia Appears to have resolved at this time.    Anemia of chronic disease/iron deficiency anemia Hemoglobin slightly dropped to 8.6. low iron levels. Will add iron supplementation.     Neutropenia probably secondary to infection Resolved.     Stage IIIb CKD  creatinine appears to be at baseline at this time.    Hypothyroidism Continue with Synthroid at 75 mcg daily. In view of her multiple medical problems, poor prognosis, age, will request palliative care consult for goals of care.    DVT prophylaxis: Lovenox.  Code Status: DNR  Family Communication: daughter at bedside.  Disposition:   Status is: Inpatient Remains inpatient appropriate because: IV Remdesevir.    Level of care: Progressive Consultants:  Palliative care.   Procedures: none.   Antimicrobials:  Antibiotics Given (last 72 hours)     Date/Time Action Medication Dose Rate   03/28/22 2321 New Bag/Given   remdesivir 200 mg in sodium chloride 0.9% 250 mL IVPB 200 mg 580 mL/hr   03/28/22 2354 New Bag/Given   doxycycline (VIBRAMYCIN) 100 mg in sodium chloride 0.9 % 250 mL IVPB 100 mg 125 mL/hr   03/29/22 0839 New Bag/Given   doxycycline (VIBRAMYCIN)  100 mg in sodium chloride 0.9 % 250 mL IVPB 100 mg 125 mL/hr   03/29/22 1028 New Bag/Given   remdesivir 100 mg in sodium chloride 0.9 % 100 mL IVPB 100 mg 200 mL/hr   03/29/22 2036 New Bag/Given   doxycycline  (VIBRAMYCIN) 100 mg in sodium chloride 0.9 % 250 mL IVPB 100 mg 125 mL/hr   03/30/22 0853 New Bag/Given   doxycycline (VIBRAMYCIN) 100 mg in sodium chloride 0.9 % 250 mL IVPB 100 mg 125 mL/hr   03/30/22 1333 New Bag/Given   remdesivir 100 mg in sodium chloride 0.9 % 100 mL IVPB 100 mg 200 mL/hr         Subjective: Pt reports not feeling well. Daughter at bedside requesting ear wax removal.   Objective: Vitals:   03/30/22 0508 03/30/22 0907 03/30/22 1339 03/30/22 1608  BP:   (!) 143/65   Pulse: 60  (!) 50   Resp:   18   Temp: 97.7 F (36.5 C)  98.4 F (36.9 C)   TempSrc: Oral  Oral   SpO2: 97% 95% 98% 95%  Weight:      Height:        Intake/Output Summary (Last 24 hours) at 03/30/2022 1651 Last data filed at 03/30/2022 1333 Gross per 24 hour  Intake 1569.29 ml  Output 200 ml  Net 1369.29 ml    Filed Weights   03/28/22 2219 03/29/22 0500 03/30/22 0500  Weight: 60 kg 58.4 kg 59 kg    Examination:  General exam: ill appearing lady still on 3 lit of [place] oxygen.  Respiratory system: diminished at bases, no wheezing heard.  Cardiovascular system: S1 & S2 heard, RRR. No JVD,  No pedal edema. Gastrointestinal system: Abdomen is nondistended, soft and nontender.Normal bowel sounds heard. Central nervous system: Alert and oriented.to place and person only.  Extremities: Symmetric 5 x 5 power. Skin: No rashes seen. Psychiatry:  Mood & affect appropriate.      Data Reviewed: I have personally reviewed following labs and imaging studies  CBC: Recent Labs  Lab 03/28/22 1501 03/29/22 0119 03/30/22 0353  WBC 2.9* 1.4* 4.7  NEUTROABS 1.9 1.1* 3.3  HGB 9.3* 9.1* 8.6*  HCT 30.7* 30.6* 28.6*  MCV 86.0 86.9 87.7  PLT 159 150 152     Basic Metabolic Panel: Recent Labs  Lab 03/28/22 1501 03/29/22 0119 03/30/22 0353  NA 131* 137 139  K 3.7 3.5 3.6  CL 98 104 108  CO2 $Re'25 24 25  'zTH$ GLUCOSE 195* 189* 112*  BUN 24* 26* 38*  CREATININE 1.18* 1.22* 1.14*  CALCIUM 7.9*  8.0* 8.3*  MG 2.0 2.6* 2.5*  PHOS  --  3.9 3.9     GFR: Estimated Creatinine Clearance: 28.1 mL/min (A) (by C-G formula based on SCr of 1.14 mg/dL (H)).  Liver Function Tests: Recent Labs  Lab 03/28/22 1501 03/29/22 0119  AST 34 34  ALT 26 30  ALKPHOS 76 76  BILITOT 0.7 0.9  PROT 6.0* 6.0*  ALBUMIN 3.2* 3.1*     CBG: No results for input(s): "GLUCAP" in the last 168 hours.   Recent Results (from the past 240 hour(s))  Resp Panel by RT-PCR (Flu A&B, Covid) Anterior Nasal Swab     Status: Abnormal   Collection Time: 03/28/22  2:57 PM   Specimen: Anterior Nasal Swab  Result Value Ref Range Status   SARS Coronavirus 2 by RT PCR POSITIVE (A) NEGATIVE Final    Comment: (NOTE) SARS-CoV-2 target nucleic acids are  DETECTED.  The SARS-CoV-2 RNA is generally detectable in upper respiratory specimens during the acute phase of infection. Positive results are indicative of the presence of the identified virus, but do not rule out bacterial infection or co-infection with other pathogens not detected by the test. Clinical correlation with patient history and other diagnostic information is necessary to determine patient infection status. The expected result is Negative.  Fact Sheet for Patients: EntrepreneurPulse.com.au  Fact Sheet for Healthcare Providers: IncredibleEmployment.be  This test is not yet approved or cleared by the [place] FDA and  has been authorized for detection and/or diagnosis of SARS-CoV-2 by FDA under an Emergency Use Authorization (EUA).  This EUA will remain in effect (meaning this test can be used) for the duration of  the COVID-19 declaration under Section 564(b)(1) of the A ct, 21 U.S.C. section 360bbb-3(b)(1), unless the authorization is terminated or revoked sooner.     Influenza A by PCR NEGATIVE NEGATIVE Final   Influenza B by PCR NEGATIVE NEGATIVE Final    Comment: (NOTE) The Xpert Xpress  SARS-CoV-2/FLU/RSV plus assay is intended as an aid in the diagnosis of influenza from Nasopharyngeal swab specimens and should not be used as a sole basis for treatment. Nasal washings and aspirates are unacceptable for Xpert Xpress SARS-CoV-2/FLU/RSV testing.  Fact Sheet for Patients: EntrepreneurPulse.com.au  Fact Sheet for Healthcare Providers: IncredibleEmployment.be  This test is not yet approved or cleared by the [place] FDA and has been authorized for detection and/or diagnosis of SARS-CoV-2 by FDA under an Emergency Use Authorization (EUA). This EUA will remain in effect (meaning this test can be used) for the duration of the COVID-19 declaration under Section 564(b)(1) of the Act, 21 U.S.C. section 360bbb-3(b)(1), unless the authorization is terminated or revoked.  Performed at [facility], [place] [address], [place], [place] [address]   Culture, blood (Routine X 2) w Reflex to ID Panel     Status: None (Preliminary result)   Collection Time: 03/28/22 10:36 PM   Specimen: BLOOD  Result Value Ref Range Status   Specimen Description   Final    BLOOD RIGHT ANTECUBITAL Performed at [facility] [address] [place], [place] [address]    Special Requests   Final    BOTTLES DRAWN AEROBIC ONLY Blood Culture adequate volume Performed at [facility] [address], [place], [place] [address]    Culture   Final    NO GROWTH 1 DAY Performed at [facility], [place] [address], [place], [place] [address]    Report Status PENDING  Incomplete  Culture, blood (Routine X 2) w Reflex to ID Panel     Status: None (Preliminary result)   Collection Time: 03/28/22 10:41 PM   Specimen: BLOOD  Result Value Ref Range Status   Specimen Description   Final    BLOOD RIGHT ANTECUBITAL Performed at [facility] [address], [place], [place] [address]    Special  Requests   Final    BOTTLES DRAWN AEROBIC ONLY Blood Culture adequate volume Performed at [facility] [address] [place], [place] [address]    Culture   Final    NO GROWTH 1 DAY Performed at [facility], [place] [address] [place],  [address]    Report Status PENDING  Incomplete         Radiology Studies: No results found.      Scheduled Meds:  carbamide peroxide  5 drop Both EARS BID   enoxaparin (LOVENOX) injection  30 mg Subcutaneous Q24H   ipratropium-albuterol  3 mL Nebulization TID   latanoprost  1 drop Both Eyes QHS   levothyroxine  75 mcg Oral QAC breakfast   pantoprazole  40 mg Oral Daily   [START ON 03/31/2022] predniSONE  50 mg Oral Daily   Continuous Infusions:  doxycycline (VIBRAMYCIN) IV 100 mg (03/30/22 0853)     LOS: 2 days        Hosie Poisson, MD [facility]   To contact the attending provider between 7A-7P or the covering provider during after hours 7P-7A, please log into the web site [email or web address] and access using universal [place] password for that web site. If you do not have the password, please call the hospital operator.  03/30/2022, 4:51 PM

## 2022-03-31 ENCOUNTER — Inpatient Hospital Stay (HOSPITAL_COMMUNITY): Payer: Medicare PPO

## 2022-03-31 DIAGNOSIS — E871 Hypo-osmolality and hyponatremia: Secondary | ICD-10-CM | POA: Diagnosis not present

## 2022-03-31 DIAGNOSIS — R0609 Other forms of dyspnea: Secondary | ICD-10-CM

## 2022-03-31 DIAGNOSIS — U071 COVID-19: Secondary | ICD-10-CM | POA: Diagnosis not present

## 2022-03-31 DIAGNOSIS — J9601 Acute respiratory failure with hypoxia: Secondary | ICD-10-CM | POA: Diagnosis not present

## 2022-03-31 DIAGNOSIS — D638 Anemia in other chronic diseases classified elsewhere: Secondary | ICD-10-CM | POA: Diagnosis not present

## 2022-03-31 LAB — ECHOCARDIOGRAM COMPLETE
AR max vel: 0.72 cm2
AV Area VTI: 0.78 cm2
AV Area mean vel: 0.67 cm2
AV Mean grad: 19.4 mmHg
AV Peak grad: 36 mmHg
Ao pk vel: 3 m/s
Area-P 1/2: 3 cm2
Calc EF: 44.8 %
Height: 66 in
MV M vel: 4.09 m/s
MV Peak grad: 66.9 mmHg
P 1/2 time: 802 msec
S' Lateral: 3.6 cm
Single Plane A2C EF: 42.7 %
Single Plane A4C EF: 43.3 %
Weight: 2141.11 oz

## 2022-03-31 LAB — BASIC METABOLIC PANEL
Anion gap: 6 (ref 5–15)
BUN: 42 mg/dL — ABNORMAL HIGH (ref 8–23)
CO2: 28 mmol/L (ref 22–32)
Calcium: 8 mg/dL — ABNORMAL LOW (ref 8.9–10.3)
Chloride: 106 mmol/L (ref 98–111)
Creatinine, Ser: 1.32 mg/dL — ABNORMAL HIGH (ref 0.44–1.00)
GFR, Estimated: 37 mL/min — ABNORMAL LOW (ref >60)
Glucose, Bld: 104 mg/dL — ABNORMAL HIGH (ref 70–99)
Potassium: 3.3 mmol/L — ABNORMAL LOW (ref 3.5–5.1)
Sodium: 140 mmol/L (ref 135–145)

## 2022-03-31 LAB — C-REACTIVE PROTEIN: CRP: 1.4 mg/dL — ABNORMAL HIGH (ref <1.0)

## 2022-03-31 LAB — PHOSPHORUS: Phosphorus: 3.8 mg/dL (ref 2.5–4.6)

## 2022-03-31 LAB — CBC WITH DIFFERENTIAL/PLATELET
Abs Immature Granulocytes: 0.03 10*3/uL (ref 0.00–0.07)
Basophils Absolute: 0 10*3/uL (ref 0.0–0.1)
Basophils Relative: 0 %
Eosinophils Absolute: 0 10*3/uL (ref 0.0–0.5)
Eosinophils Relative: 0 %
HCT: 30.5 % — ABNORMAL LOW (ref 36.0–46.0)
Hemoglobin: 9 g/dL — ABNORMAL LOW (ref 12.0–15.0)
Immature Granulocytes: 1 %
Lymphocytes Relative: 19 %
Lymphs Abs: 1.1 10*3/uL (ref 0.7–4.0)
MCH: 25.9 pg — ABNORMAL LOW (ref 26.0–34.0)
MCHC: 29.5 g/dL — ABNORMAL LOW (ref 30.0–36.0)
MCV: 87.9 fL (ref 80.0–100.0)
Monocytes Absolute: 0.7 10*3/uL (ref 0.1–1.0)
Monocytes Relative: 12 %
Neutro Abs: 3.8 10*3/uL (ref 1.7–7.7)
Neutrophils Relative %: 68 %
Platelets: 145 10*3/uL — ABNORMAL LOW (ref 150–400)
RBC: 3.47 MIL/uL — ABNORMAL LOW (ref 3.87–5.11)
RDW: 16.1 % — ABNORMAL HIGH (ref 11.5–15.5)
WBC: 5.5 10*3/uL (ref 4.0–10.5)
nRBC: 0 % (ref 0.0–0.2)

## 2022-03-31 LAB — MAGNESIUM: Magnesium: 2.2 mg/dL (ref 1.7–2.4)

## 2022-03-31 MED ORDER — IPRATROPIUM-ALBUTEROL 0.5-2.5 (3) MG/3ML IN SOLN
3.0000 mL | Freq: Three times a day (TID) | RESPIRATORY_TRACT | Status: DC
  Administered 2022-03-31 – 2022-04-03 (×9): 3 mL via RESPIRATORY_TRACT
  Filled 2022-03-31 (×9): qty 3

## 2022-03-31 MED ORDER — POTASSIUM CHLORIDE CRYS ER 20 MEQ PO TBCR
40.0000 meq | EXTENDED_RELEASE_TABLET | Freq: Once | ORAL | Status: AC
  Administered 2022-03-31: 40 meq via ORAL
  Filled 2022-03-31: qty 2

## 2022-03-31 MED ORDER — IPRATROPIUM-ALBUTEROL 0.5-2.5 (3) MG/3ML IN SOLN: 3.0000 mL | Freq: Two times a day (BID) | RESPIRATORY_TRACT | Status: DC

## 2022-03-31 NOTE — Evaluation (Addendum)
Physical Therapy Evaluation Patient Details Name: [name] MRN: [id number] DOB: August 06, 1924 Today's Date: 03/31/2022  History of Present Illness  88 yo female admitted with COVID. Hx of COPd, CKD, anxiety, CVA, aortic stenosis  Clinical Impression  On eval, pt was MIn guard A for mobility. She walked ~15 feet x 3 around the room with a RW. Pt presents with general weakness, decreased activity tolerance, and impaired gait and balance. O2 90% on 3L [place] O2 with activity. Pt fatigues fairly easily. Discussed d/c plan-pt plans to return to her ALF with her daughter assisting as needed. Will recommend HHPT, HHOT f/u. Will plan to follow pt during this hospital stay.        Recommendations for follow up therapy are one component of a multi-disciplinary discharge planning process, led by the attending physician.  Recommendations may be updated based on patient status, additional functional criteria and insurance authorization.  Follow Up Recommendations Home health PT; Home Health OT (at ALF)      Assistance Recommended at Discharge Intermittent Supervision/Assistance  Patient can return home with the following  A little help with walking and/or transfers;A little help with bathing/dressing/bathroom;Assistance with cooking/housework;Help with stairs or ramp for entrance;Assist for transportation    Equipment Recommendations None recommended by PT  Recommendations for Other Services       Functional Status Assessment Patient has had a recent decline in their functional status and demonstrates the ability to make significant improvements in function in a reasonable and predictable amount of time.     Precautions / Restrictions Precautions Precautions: Fall Precaution Comments: monitor O2 Restrictions Weight Bearing Restrictions: No      Mobility  Bed Mobility Overal bed mobility: Needs Assistance Bed Mobility: Supine to Sit     Supine to sit: Min guard, HOB elevated     General  bed mobility comments: MIn guard for safety. Increased time.    Transfers Overall transfer level: Needs assistance Equipment used: Rolling walker (2 wheels) Transfers: Sit to/from Stand Sit to Stand: Min guard           General transfer comment: x 3-once from bed, twice from recliner. Cues for safety, hand placement.    Ambulation/Gait Ambulation/Gait assistance: Min guard Gait Distance (Feet): 15 Feet (x3) Assistive device: Rolling walker (2 wheels) Gait Pattern/deviations: Step-through pattern, Decreased stride length       General Gait Details: Close Min guard A. Pt took 3 laps around the room with seated rest break between walks. O2 90% on 3L O2, dyspnea 2/4.  Stairs            Wheelchair Mobility    Modified Rankin (Stroke Patients Only)       Balance Overall balance assessment: Needs assistance         Standing balance support: Bilateral upper extremity supported, Reliant on assistive device for balance, During functional activity Standing balance-Leahy Scale: Poor                               Pertinent Vitals/Pain Pain Assessment Pain Assessment: Faces Faces Pain Scale: Hurts little more Pain Location: back Pain Descriptors / Indicators: Discomfort, Aching, Sore Pain Intervention(s): Limited activity within patient's tolerance, Monitored during session, Repositioned    Home Living Family/patient expects to be discharged to:: Assisted living                 Home Equipment: Conservation officer, nature (2 wheels);Shower seat;Cane - single point;Grab bars -  tub/shower;Grab bars - toilet      Prior Function Prior Level of Function : Independent/Modified Independent             Mobility Comments: uses RW for ambulation       Hand Dominance        Extremity/Trunk Assessment   Upper Extremity Assessment Upper Extremity Assessment: Generalized weakness    Lower Extremity Assessment Lower Extremity Assessment: Generalized  weakness    Cervical / Trunk Assessment Cervical / Trunk Assessment: Kyphotic  Communication   Communication: HOH  Cognition Arousal/Alertness: Awake/alert Behavior During Therapy: WFL for tasks assessed/performed Overall Cognitive Status: Within Functional Limits for tasks assessed                                          General Comments      Exercises     Assessment/Plan    PT Assessment Patient needs continued PT services  PT Problem List Decreased strength;Decreased mobility;Decreased activity tolerance;Decreased balance;Decreased knowledge of use of DME;Pain       PT Treatment Interventions DME instruction;Gait training;Therapeutic activities;Patient/family education;Therapeutic exercise;Balance training;Functional mobility training    PT Goals (Current goals can be found in the Care Plan section)  Acute Rehab PT Goals Patient Stated Goal: to get better and stronger PT Goal Formulation: With patient Time For Goal Achievement: 04/14/22 Potential to Achieve Goals: Good    Frequency Min 3X/week     Co-evaluation               AM-PAC PT "6 Clicks" Mobility  Outcome Measure Help needed turning from your back to your side while in a flat bed without using bedrails?: None Help needed moving from lying on your back to sitting on the side of a flat bed without using bedrails?: None Help needed moving to and from a bed to a chair (including a wheelchair)?: A Little Help needed standing up from a chair using your arms (e.g., wheelchair or bedside chair)?: A Little Help needed to walk in hospital room?: A Little Help needed climbing 3-5 steps with a railing? : A Little 6 Click Score: 20    End of Session Equipment Utilized During Treatment: Oxygen Activity Tolerance: Patient limited by fatigue Patient left: in chair;with call bell/phone within reach;with chair alarm set   PT Visit Diagnosis: Muscle weakness (generalized) (M62.81);Difficulty in  walking, not elsewhere classified (R26.2)    Time: 5379-4327 PT Time Calculation (min) (ACUTE ONLY): 30 min   Charges:   PT Evaluation $PT Eval Low Complexity: 1 Low PT Treatments $Gait Training: 8-22 mins           Doreatha Massed, PT Acute Rehabilitation  Office: [phone] Pager: [phone]

## 2022-03-31 NOTE — Progress Notes (Signed)
  Echocardiogram 2D Echocardiogram has been performed.  [name] 03/31/2022, 12:23 PM

## 2022-03-31 NOTE — Progress Notes (Signed)
PROGRESS NOTE    [name]  JSH:[id number] DOB: 1924/08/27 DOA: 03/28/2022 PCP: [name], NP    Chief Complaint  Patient presents with   Shortness of Breath    Brief Narrative:   [name] is a 88 y.o. female with medical history significant for severe COPD on chronic corticosteroid therapy, chronic systolic/diastolic heart failure, moderate aortic stenosis, chronic kidney disease stage IIIb with baseline creatinine range 1.2-1.4, hypertension, hyperlipidemia, acquired hypothyroidism, who is admitted to [facility] on 03/28/2022 with severe COVID-19 infection after presenting from SNF to [facility] ED complaining of shortness of breath.   She underwent COVID-19 testing at SNF on 03/27/22, which was found to be positive.  In the setting of further progression of the above respiratory symptoms over the course the next day, she presents to [facility]. emergency department sooner for further evaluation management of the above.   The patient was admitted for further evaluation management of severe IFOYD-74 infection complicated by acute hypoxic respiratory distress as well as acute COPD exacerbation, criteria met for SIRS and presenting labs notable for acute hyponatremia.  Assessment & Plan:   Principal Problem:   COVID-19 virus infection Active Problems:   Hypothyroidism   Anemia of chronic disease   HLD (hyperlipidemia)   Stage 3b chronic kidney disease (CKD) (HCC)   COPD with acute exacerbation (HCC)   Acute respiratory failure with hypoxia (HCC)   Chronic combined systolic and diastolic heart failure (HCC)   Acute hyponatremia   Severe sepsis (HCC)   Acute respiratory failure with hypoxia requiring about 3 L of nasal cannula oxygen probably secondary to COVID-19 infection.,  With underlying COPD exacerbation. Influenza PCR is negative Chest x-ray does not show any acute cardiopulmonary disease at this time. Patient started on  IV Solu-Medrol, IV remdesivir and IV  doxycycline.she is transitioned to oral prednisone, oral doxycycline, and complete the course of remdesevir.  Continue to follow inflammatory markers. Continue with DuoNebs, flutter valve and incentive spirometry. Procalcitonin is negative, CRP is 2.7  on admission to 1.9. lactic acid of 2.1. repeat lactic acid in am.  Follow blood cultures Pt reports feeling much better. Therapy eval recommending home health PT at ALF.   Chronic systolic heart failure:  BNP elevated, on admission,  ECHO showed LVEF of 45% .  One dose of lasix given yesterday.  She reports feeling much better.   SIRS criteria with leukopenia, tachycardia and tachypnea met on admission Resolved.     Hyponatremia Appears to have resolved at this time.    Anemia of chronic disease/iron deficiency anemia Hemoglobin stabilized at 9.   low iron levels. Will add iron supplementation.     Neutropenia probably secondary to infection Resolved.     Stage IIIb CKD  creatinine appears to be at baseline at this time.    Hypothyroidism Continue with Synthroid at 75 mcg daily. In view of her multiple medical problems, poor prognosis, age, will request palliative care consult for goals of care.    DVT prophylaxis: Lovenox.  Code Status: DNR  Family Communication: daughter at bedside.  Disposition:   Status is: Inpatient Remains inpatient appropriate because: IV Remdesevir.    Level of care: Progressive Consultants:  Palliative care.   Procedures: none.   Antimicrobials:  Antibiotics Given (last 72 hours)     Date/Time Action Medication Dose Rate   03/28/22 2321 New Bag/Given   remdesivir 200 mg in sodium chloride 0.9% 250 mL IVPB 200 mg 580 mL/hr   03/28/22 2354  New Bag/Given   doxycycline (VIBRAMYCIN) 100 mg in sodium chloride 0.9 % 250 mL IVPB 100 mg 125 mL/hr   03/29/22 0839 New Bag/Given   doxycycline (VIBRAMYCIN) 100 mg in sodium chloride 0.9 % 250 mL IVPB 100 mg 125 mL/hr   03/29/22 1028 New  Bag/Given   remdesivir 100 mg in sodium chloride 0.9 % 100 mL IVPB 100 mg 200 mL/hr   03/29/22 2036 New Bag/Given   doxycycline (VIBRAMYCIN) 100 mg in sodium chloride 0.9 % 250 mL IVPB 100 mg 125 mL/hr   03/30/22 0853 New Bag/Given   doxycycline (VIBRAMYCIN) 100 mg in sodium chloride 0.9 % 250 mL IVPB 100 mg 125 mL/hr   03/30/22 1333 New Bag/Given   remdesivir 100 mg in sodium chloride 0.9 % 100 mL IVPB 100 mg 200 mL/hr   03/30/22 2031 Given   doxycycline (VIBRA-TABS) tablet 100 mg 100 mg    03/31/22 1235 Given   doxycycline (VIBRA-TABS) tablet 100 mg 100 mg          Subjective: Feeling much better. Pt alert and answering all questions.   Objective: Vitals:   03/31/22 0500 03/31/22 0738 03/31/22 1311 03/31/22 1330  BP:    (!) 155/74  Pulse:    (!) 52  Resp:    18  Temp:    97.9 F (36.6 C)  TempSrc:    Oral  SpO2:  94% 95% 96%  Weight: 60.7 kg     Height:        Intake/Output Summary (Last 24 hours) at 03/31/2022 1636 Last data filed at 03/31/2022 0930 Gross per 24 hour  Intake 480 ml  Output --  Net 480 ml    Filed Weights   03/29/22 0500 03/30/22 0500 03/31/22 0500  Weight: 58.4 kg 59 kg 60.7 kg    Examination:  General exam: ill appearing elderly woman not in distress.  Respiratory system: air entry fair, no wheezing heard. On 2 to 3 lit of [place] oxygen.  Cardiovascular system: S1 & S2 heard, RRR. No JVD, No pedal edema. Gastrointestinal system: Abdomen is nondistended, soft and nontender.  Normal bowel sounds heard. Central nervous system: Alert and oriented. No focal neurological deficits. Extremities: no pedal edema.  Skin: No rashes,  Psychiatry:  Mood & affect appropriate.       Data Reviewed: I have personally reviewed following labs and imaging studies  CBC: Recent Labs  Lab 03/28/22 1501 03/29/22 0119 03/30/22 0353 03/31/22 0351  WBC 2.9* 1.4* 4.7 5.5  NEUTROABS 1.9 1.1* 3.3 3.8  HGB 9.3* 9.1* 8.6* 9.0*  HCT 30.7* 30.6* 28.6* 30.5*   MCV 86.0 86.9 87.7 87.9  PLT 159 150 152 145*     Basic Metabolic Panel: Recent Labs  Lab 03/28/22 1501 03/29/22 0119 03/30/22 0353 03/31/22 0351  NA 131* 137 139 140  K 3.7 3.5 3.6 3.3*  CL 98 104 108 106  CO2 _0 GLUCOSE 195* 189* 112* 104*  BUN 24* 26* 38* 42*  CREATININE 1.18* 1.22* 1.14* 1.32*  CALCIUM 7.9* 8.0* 8.3* 8.0*  MG 2.0 2.6* 2.5* 2.2  PHOS  --  3.9 3.9 3.8     GFR: Estimated Creatinine Clearance: 24.4 mL/min (A) (by C-G formula based on SCr of 1.32 mg/dL (H)).  Liver Function Tests: Recent Labs  Lab 03/28/22 1501 03/29/22 0119  AST 34 34  ALT 26 30  ALKPHOS 76 76  BILITOT 0.7 0.9  PROT 6.0* 6.0*  ALBUMIN 3.2* 3.1*  CBG: No results for input(s): "GLUCAP" in the last 168 hours.   Recent Results (from the past 240 hour(s))  Resp Panel by RT-PCR (Flu A&B, Covid) Anterior Nasal Swab     Status: Abnormal   Collection Time: 03/28/22  2:57 PM   Specimen: Anterior Nasal Swab  Result Value Ref Range Status   SARS Coronavirus 2 by RT PCR POSITIVE (A) NEGATIVE Final    Comment: (NOTE) SARS-CoV-2 target nucleic acids are DETECTED.  The SARS-CoV-2 RNA is generally detectable in upper respiratory specimens during the acute phase of infection. Positive results are indicative of the presence of the identified virus, but do not rule out bacterial infection or co-infection with other pathogens not detected by the test. Clinical correlation with patient history and other diagnostic information is necessary to determine patient infection status. The expected result is Negative.  Fact Sheet for Patients: EntrepreneurPulse.com.au  Fact Sheet for Healthcare Providers: IncredibleEmployment.be  This test is not yet approved or cleared by the [place] FDA and  has been authorized for detection and/or diagnosis of SARS-CoV-2 by FDA under an Emergency Use Authorization (EUA).  This EUA will remain in  effect (meaning this test can be used) for the duration of  the COVID-19 declaration under Section 564(b)(1) of the A ct, 21 U.S.C. section 360bbb-3(b)(1), unless the authorization is terminated or revoked sooner.     Influenza A by PCR NEGATIVE NEGATIVE Final   Influenza B by PCR NEGATIVE NEGATIVE Final    Comment: (NOTE) The Xpert Xpress SARS-CoV-2/FLU/RSV plus assay is intended as an aid in the diagnosis of influenza from Nasopharyngeal swab specimens and should not be used as a sole basis for treatment. Nasal washings and aspirates are unacceptable for Xpert Xpress SARS-CoV-2/FLU/RSV testing.  Fact Sheet for Patients: EntrepreneurPulse.com.au  Fact Sheet for Healthcare Providers: IncredibleEmployment.be  This test is not yet approved or cleared by the [place] FDA and has been authorized for detection and/or diagnosis of SARS-CoV-2 by FDA under an Emergency Use Authorization (EUA). This EUA will remain in effect (meaning this test can be used) for the duration of the COVID-19 declaration under Section 564(b)(1) of the Act, 21 U.S.C. section 360bbb-3(b)(1), unless the authorization is terminated or revoked.  Performed at [facility], [place] [address] [place], [place] [address]   Culture, blood (Routine X 2) w Reflex to ID Panel     Status: None (Preliminary result)   Collection Time: 03/28/22 10:36 PM   Specimen: BLOOD  Result Value Ref Range Status   Specimen Description   Final    BLOOD RIGHT ANTECUBITAL Performed at [facility] [address] [place], [place] [address]    Special Requests   Final    BOTTLES DRAWN AEROBIC ONLY Blood Culture adequate volume Performed at [place] [address], [place], [place] [address]    Culture   Final    NO GROWTH 2 DAYS Performed at [facility] [address] [place], [place] [address]    Report Status PENDING   Incomplete  Culture, blood (Routine X 2) w Reflex to ID Panel     Status: None (Preliminary result)   Collection Time: 03/28/22 10:41 PM   Specimen: BLOOD  Result Value Ref Range Status   Specimen Description   Final    BLOOD RIGHT ANTECUBITAL Performed at [place] [address] [place], [place] [address]    Special Requests   Final    BOTTLES  DRAWN AEROBIC ONLY Blood Culture adequate volume Performed at [facility] [address], [place], [place] [address]    Culture   Final    NO GROWTH 2 DAYS Performed at [facility] [address], [place], [place] [address]    Report Status PENDING  Incomplete         Radiology Studies: ECHOCARDIOGRAM COMPLETE  Result Date: 03/31/2022    ECHOCARDIOGRAM REPORT   Patient Name:   [name] Date of Exam: 03/31/2022 Medical Rec #:  [id number]     Height:       66.0 in Accession #:    [id number]    Weight:       133.8 lb Date of Birth:  11-22-1924    BSA:          1.686 m Patient Age:    27 years      BP:           167/95 mmHg Patient Gender: F             HR:           66 bpm. Exam Location:  Inpatient Procedure: 2D Echo Indications:    Dyspnea  History:        Patient has prior history of Echocardiogram examinations, most                 recent 12/25/2020. CHF, Stroke; Risk Factors:Former Smoker,                 Hypertension and Dyslipidemia.  Sonographer:    Harvie Junior Referring Phys: Theola Sequin  Sonographer Comments: Technically difficult study due to poor echo windows. Image acquisition challenging due to COPD, supine and Image acquisition challenging due to respiratory motion. IMPRESSIONS  1. Left ventricular ejection fraction, by estimation, is 40 to 45%. The left ventricle has mildly decreased function. The left ventricle demonstrates global hypokinesis. There is mild left ventricular hypertrophy. Left ventricular diastolic parameters are consistent with Grade I diastolic dysfunction  (impaired relaxation).  2. Right ventricular systolic function is normal. The right ventricular size is normal. There is normal pulmonary artery systolic pressure. The estimated right ventricular systolic pressure is 40.0 mmHg.  3. Left atrial size was mildly dilated.  4. The mitral valve is normal in structure. Trivial mitral valve regurgitation.  5. The inferior vena cava is dilated in size with <50% respiratory variability, suggesting right atrial pressure of 15 mmHg.  6. The aortic valve is calcified. There is severe calcification of the aortic valve. Aortic valve regurgitation is trivial. Severe aortic valve stenosis. Moderate AS by gradients (Vmax 3.1 m/s, MG 12mHg), severe by AVA (0.7 cm^2) and DI (0.25). Low SV index (31 cc/m^2), suspect low flow low gradient severe AS FINDINGS  Left Ventricle: Left ventricular ejection fraction, by estimation, is 40 to 45%. The left ventricle has mildly decreased function. The left ventricle demonstrates global hypokinesis. The left ventricular internal cavity size was normal in size. There is  mild left ventricular hypertrophy. Left ventricular diastolic parameters are consistent with Grade I diastolic dysfunction (impaired relaxation). Right Ventricle: The right ventricular size is normal. No increase in right ventricular wall thickness. Right ventricular systolic function is normal. There is normal pulmonary artery systolic pressure. The tricuspid regurgitant velocity is 2.10 m/s, and  with an assumed right atrial pressure of 15 mmHg, the estimated right ventricular systolic pressure is 386.7mmHg. Left Atrium: Left atrial size was mildly dilated. Right Atrium:  Right atrial size was normal in size. Pericardium: There is no evidence of pericardial effusion. Mitral Valve: The mitral valve is normal in structure. Trivial mitral valve regurgitation. Tricuspid Valve: The tricuspid valve is normal in structure. Tricuspid valve regurgitation is trivial. Aortic Valve: The aortic  valve is calcified. There is severe calcifcation of the aortic valve. Aortic valve regurgitation is trivial. Aortic regurgitation PHT measures 802 msec. Moderate to severe aortic stenosis is present. Aortic valve mean gradient measures 19.4 mmHg. Aortic valve peak gradient measures 36.0 mmHg. Aortic valve area, by VTI measures 0.78 cm. Pulmonic Valve: The pulmonic valve was not well visualized. Pulmonic valve regurgitation is not visualized. Aorta: The aortic root and ascending aorta are structurally normal, with no evidence of dilitation. Venous: The inferior vena cava is dilated in size with less than 50% respiratory variability, suggesting right atrial pressure of 15 mmHg. IAS/Shunts: The interatrial septum was not well visualized. Additional Comments: There is pleural effusion in the left lateral region.  LEFT VENTRICLE PLAX 2D LVIDd:         4.80 cm      Diastology LVIDs:         3.60 cm      LV e' medial:    4.05 cm/s LV PW:         1.10 cm      LV E/e' medial:  16.0 LV IVS:        1.10 cm      LV e' lateral:   6.38 cm/s LVOT diam:     1.90 cm      LV E/e' lateral: 10.1 LV SV:         52 LV SV Index:   31 LVOT Area:     2.84 cm  LV Volumes (MOD) LV vol d, MOD A2C: 106.0 ml LV vol d, MOD A4C: 79.6 ml LV vol s, MOD A2C: 60.7 ml LV vol s, MOD A4C: 45.1 ml LV SV MOD A2C:     45.3 ml LV SV MOD A4C:     79.6 ml LV SV MOD BP:      45.2 ml RIGHT VENTRICLE RV Basal diam:  3.10 cm RV Mid diam:    2.60 cm RV S prime:     13.20 cm/s TAPSE (M-mode): 1.7 cm LEFT ATRIUM             Index        RIGHT ATRIUM           Index LA diam:        3.10 cm 1.84 cm/m   RA Area:     12.10 cm LA Vol (A2C):   81.8 ml 48.52 ml/m  RA Volume:   25.50 ml  15.13 ml/m LA Vol (A4C):   51.2 ml 30.37 ml/m LA Biplane Vol: 65.8 ml 39.03 ml/m  AORTIC VALVE                     PULMONIC VALVE AV Area (Vmax):    0.72 cm      PV Vmax:       1.37 m/s AV Area (Vmean):   0.67 cm      PV Peak grad:  7.5 mmHg AV Area (VTI):     0.78 cm AV Vmax:            299.80 cm/s AV Vmean:          206.200 cm/s AV VTI:  0.672 m AV Peak Grad:      36.0 mmHg AV Mean Grad:      19.4 mmHg LVOT Vmax:         75.80 cm/s LVOT Vmean:        48.400 cm/s LVOT VTI:          0.185 m LVOT/AV VTI ratio: 0.28 AI PHT:            802 msec  AORTA Ao Root diam: 3.00 cm Ao Asc diam:  2.70 cm MITRAL VALVE                TRICUSPID VALVE MV Area (PHT): 3.00 cm     TR Peak grad:   17.6 mmHg MV Decel Time: 253 msec     TR Vmax:        210.00 cm/s MR Peak grad: 66.9 mmHg MR Vmax:      409.00 cm/s   SHUNTS MV E velocity: 64.70 cm/s   Systemic VTI:  0.18 m MV A velocity: 102.00 cm/s  Systemic Diam: 1.90 cm MV E/A ratio:  0.63 Oswaldo Milian MD Electronically signed by Oswaldo Milian MD Signature Date/Time: 03/31/2022/12:36:34 PM    Final         Scheduled Meds:  carbamide peroxide  5 drop Both EARS BID   doxycycline  100 mg Oral Q12H   enoxaparin (LOVENOX) injection  30 mg Subcutaneous Q24H   ipratropium-albuterol  3 mL Nebulization TID   latanoprost  1 drop Both Eyes QHS   levothyroxine  75 mcg Oral QAC breakfast   pantoprazole  40 mg Oral Daily   potassium chloride  40 mEq Oral Once   predniSONE  50 mg Oral Daily   Continuous Infusions:     LOS: 3 days        Hosie Poisson, MD [facility]   To contact the attending provider between 7A-7P or the covering provider during after hours 7P-7A, please log into the web site [email or web address] and access using universal  password for that web site. If you do not have the password, please call the hospital operator.  03/31/2022, 4:36 PM

## 2022-03-31 NOTE — TOC Initial Note (Signed)
Transition of Care [name]) - Initial/Assessment Note    Patient Details  Name: [name] MRN: [id number] Date of Birth: December 08, 1924  Transition of Care [name]) CM/SW Contact:    Henrietta Dine, RN Phone Number: 03/31/2022, 3:28 PM  Clinical Narrative: spoke with pt's daughter Gloria Persia via phone [phone]) due to pt COVID +; pt is a resident of [facility]; unable to verify level of care; pt has bilateral hearing aids, and has macular degeneration; she uses a walker; PT unable to complete eval d/t pt condition; TOC will con't to follow pt.           Expected Discharge Plan: Assisted Living (previously at [facility]) Barriers to Discharge: Continued Medical Work up   Patient Goals and CMS Choice Patient states their goals for this hospitalization and ongoing recovery are:: [place] ([place] [name])      Expected Discharge Plan and Services Expected Discharge Plan: Assisted Living (previously at [facility])   Discharge Planning Services: [place] [name] arrangements for the past 2 months: [place]                                      Prior Living Arrangements/Services Living arrangements for the past 2 months: [place] Lives with:: Facility Resident Patient language and need for interpreter reviewed:: Yes Do you feel safe going back to the place where you live?: Yes      Need for Family Participation in Patient Care: Yes (Comment) Care giver support system in place?: Yes (comment)   Criminal Activity/Legal Involvement Pertinent to Current Situation/Hospitalization: No - Comment as needed  Activities of Daily Living Home Assistive Devices/Equipment: Environmental consultant (specify type), Eyeglasses, Shower chair with back (transport wheelchair, full set dentures, prism glasses, reading glasses) ADL Screening (condition at time of admission) Patient's cognitive ability adequate to  safely complete daily activities?: Yes Is the patient deaf or have difficulty hearing?: Yes Does the patient have difficulty seeing, even when wearing glasses/contacts?: Yes (macular degeneration in both eyes - retina problems in left eye and next shot is due in Oct.) Does the patient have difficulty concentrating, remembering, or making decisions?: No Patient able to express need for assistance with ADLs?: Yes Does the patient have difficulty dressing or bathing?: Yes Independently performs ADLs?: No Communication: Independent Dressing (OT): Needs assistance Is this a change from baseline?: Pre-admission baseline Feeding: Needs assistance Is this a change from baseline?: Change from baseline, expected to last >3 days Bathing: Needs assistance Is this a change from baseline?: Pre-admission baseline Toileting: Needs assistance Is this a change from baseline?: Pre-admission baseline In/Out Bed: Needs assistance Is this a change from baseline?: Pre-admission baseline Walks in Home: Needs assistance Is this a change from baseline?: Pre-admission baseline Does the patient have difficulty walking or climbing stairs?: Yes Weakness of Legs: Both Weakness of Arms/Hands: Both (right hand has not worked well for approx. 2 weeks)  Permission Sought/Granted Permission sought to share information with : Case Manager Permission granted to share information with : Yes, Verbal Permission Granted  Share Information with NAME: Lenor Coffin, RN, CM           Emotional Assessment         Alcohol / Substance Use: Not Applicable Psych Involvement: No (comment)  Admission diagnosis:  COVID [U07.1] COVID-19 virus infection [U07.1] Patient Active  Problem List   Diagnosis Date Noted   COVID-19 virus infection 03/28/2022   Acute hyponatremia 03/28/2022   Severe sepsis ([place]) 03/28/2022   Aortic stenosis 07/16/2021   Chronic combined systolic and diastolic CHF (congestive heart failure) ([place])  02/27/2021   Brain aneurysm 02/27/2021   Cervical spinal stenosis 02/27/2021   Cerebral thrombosis with cerebral infarction 12/25/2020   Right hand weakness 12/23/2020   SVT (supraventricular tachycardia) ([place]) 09/01/2020   Chronic combined systolic and diastolic heart failure ([place]) 09/01/2020   Acute respiratory failure with hypoxia (HCC)    Mild aortic stenosis 08/30/2020   Elevated troponin 08/30/2020   Pleural effusion on left 08/30/2020   Pain in joint of left knee 04/10/2020   D-dimer, elevated 01/14/2020   Elevated brain natriuretic peptide (BNP) level 01/14/2020   Current chronic use of systemic steroids 07/13/2019   Medication management 04/02/2018   COPD exacerbation ([place]) 03/31/2018   Hypokalemia 03/30/2018   COPD with acute exacerbation ([place]) 03/02/2018   SOB (shortness of breath) 01/12/2018   HLD (hyperlipidemia) 01/12/2018   Stage 3b chronic kidney disease (CKD) ([place]) 01/12/2018   New onset left bundle branch block (LBBB) 01/12/2018   COPD GOLD I D 01/12/2018   Neurogenic claudication due to lumbar spinal stenosis 09/13/2017   Anemia of chronic disease    AVM (arteriovenous malformation) of duodenum, acquired with hemorrhage    Acute GI bleeding 10/09/2016   COPD with emphysema ([place]) 05/10/2016   Recurrent laryngeal neuropathy 02/01/2016   Nausea with vomiting 12/11/2015   Left carotid stenosis 11/21/2015   Asymptomatic carotid artery stenosis 11/13/2015   Impaired glucose tolerance 10/27/2015   Solitary pulmonary nodule 10/27/2015   Thyroid nodule 10/27/2015   Syncope and collapse 10/04/2015   Bradycardia with less than 60 beats per minute 10/04/2015   History of embolic stroke 16/38/4665   Paresthesia of both feet 07/06/2014   Aortic arch atherosclerosis ([place]) 06/24/2014   Atherosclerotic ulcer of aorta ([place]) 06/24/2014   Stroke ([place]) 06/22/2014   Bilateral carotid artery disease ([place]) 05/18/2014   TOBACCO USE, QUIT 04/12/2009   [place] DISEASE, LUMBAR  12/16/2008   DIVERTICULOSIS, COLON 09/30/2008   Personal history of colonic polyps 09/30/2008   DYSPNEA 07/13/2008   Weakness 11/09/2007   Hypothyroidism 10/13/2007   Mixed hyperlipidemia 03/06/2007   Essential hypertension 03/06/2007   Osteoarthritis 03/06/2007   PCP:  [name], NP Pharmacy:   [id number] [place] [address]Phone: [phone]: [phone]    Social Determinants of Health (SDOH) Interventions    Readmission Risk Interventions    12/26/2020    2:05 PM  Readmission Risk Prevention Plan  Transportation Screening Complete  Medication Review (RN Care Manager) Complete  PCP or Specialist appointment within 3-5 days of discharge Complete  HRI or HJeannetteComplete  SW Recovery Care/Counseling Consult Complete  PMathewsComplete

## 2022-03-31 NOTE — Progress Notes (Signed)
Physical Therapy Treatment Patient Details Name: [name] MRN: [id number] DOB: 08-21-1924 Today's Date: 03/31/2022   SATURATION QUALIFICATIONS: (This note is used to comply with regulatory documentation for home oxygen)   Patient Saturations on Room Air while Ambulating = 85%  Patient Saturations on 3 Liters of oxygen while Ambulating = 90%   History of Present Illness 88 yo female admitted with [place]. Hx of COPd, CKD, anxiety, CVA, aortic stenosis    PT Comments    Chair alarm sounding. Daughter in room attempting to assist pt to the bathroom. Pt continues to require close Min guard A but she is weak and fatigues fairly easily. Assisted pt to bathroom and back to the recliner at her request. Encouraged pt to try to walk to bathroom, with assistance, instead of using purewick during the day. Will continue to follow.    Recommendations for follow up therapy are one component of a multi-disciplinary discharge planning process, led by the attending physician.  Recommendations may be updated based on patient status, additional functional criteria and insurance authorization.  Follow Up Recommendations  Home health PT ([place])     Assistance Recommended at Discharge Intermittent Supervision/Assistance  Patient can return home with the following A little help with walking and/or transfers;A little help with bathing/dressing/bathroom;Assistance with cooking/housework;Help with stairs or ramp for entrance;Assist for transportation   Equipment Recommendations  None recommended by PT    Recommendations for Other Services       Precautions / Restrictions Precautions Precautions: Fall Precaution Comments: monitor O2 Restrictions Weight Bearing Restrictions: No     Mobility  Bed Mobility Overal bed mobility: Needs Assistance Bed Mobility: Supine to Sit     Supine to sit: Min guard, HOB elevated     General bed mobility comments: oob in recliner     Transfers Overall transfer level: Needs assistance Equipment used: Rolling walker (2 wheels) Transfers: Sit to/from Stand Sit to Stand: Min guard           General transfer comment: Close Min guard A. Cues for safety, hand placement. 2 attempts to safely rise from recliner with LOB x 1 posteriorly    Ambulation/Gait Ambulation/Gait assistance: Min guard Gait Distance (Feet): 15 Feet (x2) Assistive device: Rolling walker (2 wheels) Gait Pattern/deviations: Step-through pattern, Decreased stride length       General Gait Details: Close Min guard A. O2 85% on RA   Stairs             Wheelchair Mobility    Modified Rankin (Stroke Patients Only)       Balance Overall balance assessment: Needs assistance         Standing balance support: Bilateral upper extremity supported, Reliant on assistive device for balance, During functional activity Standing balance-Leahy Scale: Poor                              Cognition Arousal/Alertness: Awake/alert Behavior During Therapy: WFL for tasks assessed/performed Overall Cognitive Status: Within Functional Limits for tasks assessed                                          Exercises      General Comments        Pertinent Vitals/Pain Pain Assessment Pain Assessment: Faces Faces Pain Scale: Hurts little more Pain Location: back Pain Descriptors /  Indicators: Discomfort, Aching, Sore Pain Intervention(s): Limited activity within patient's tolerance, Monitored during session, Repositioned    Home Living Family/patient expects to be discharged to:: Assisted living                 Home Equipment: Rolling Walker (2 wheels);Shower seat;Cane - single point;Grab bars - tub/shower;Grab bars - toilet      Prior Function            PT Goals (current goals can now be found in the care plan section) Acute Rehab PT Goals Patient Stated Goal: to get better and stronger PT Goal  Formulation: With patient Time For Goal Achievement: 04/14/22 Potential to Achieve Goals: Good Progress towards PT goals: Progressing toward goals    Frequency    Min 3X/week      PT Plan Current plan remains appropriate    Co-evaluation              AM-PAC PT "6 Clicks" Mobility   Outcome Measure  Help needed turning from your back to your side while in a flat bed without using bedrails?: None Help needed moving from lying on your back to sitting on the side of a flat bed without using bedrails?: None Help needed moving to and from a bed to a chair (including a wheelchair)?: A Little Help needed standing up from a chair using your arms (e.g., wheelchair or bedside chair)?: A Little Help needed to walk in hospital room?: A Little Help needed climbing 3-5 steps with a railing? : A Lot 6 Click Score: 19    End of Session Equipment Utilized During Treatment: Gait belt;Oxygen Activity Tolerance: Patient limited by fatigue Patient left: in chair;with call bell/phone within reach;with family/visitor present   PT Visit Diagnosis: Muscle weakness (generalized) (M62.81);Difficulty in walking, not elsewhere classified (R26.2)     Time: 4401-0272 PT Time Calculation (min) (ACUTE ONLY): 10 min  Charges:  $Gait Training: 8-22 mins                         Doreatha Massed, PT Acute Rehabilitation  Office: [phone] Pager: [phone]

## 2022-04-01 DIAGNOSIS — D638 Anemia in other chronic diseases classified elsewhere: Secondary | ICD-10-CM | POA: Diagnosis not present

## 2022-04-01 DIAGNOSIS — J9601 Acute respiratory failure with hypoxia: Secondary | ICD-10-CM | POA: Diagnosis not present

## 2022-04-01 DIAGNOSIS — E871 Hypo-osmolality and hyponatremia: Secondary | ICD-10-CM | POA: Diagnosis not present

## 2022-04-01 DIAGNOSIS — U071 COVID-19: Secondary | ICD-10-CM | POA: Diagnosis not present

## 2022-04-01 LAB — BASIC METABOLIC PANEL
Anion gap: 6 (ref 5–15)
BUN: 39 mg/dL — ABNORMAL HIGH (ref 8–23)
CO2: 28 mmol/L (ref 22–32)
Calcium: 8.4 mg/dL — ABNORMAL LOW (ref 8.9–10.3)
Chloride: 108 mmol/L (ref 98–111)
Creatinine, Ser: 1.09 mg/dL — ABNORMAL HIGH (ref 0.44–1.00)
GFR, Estimated: 47 mL/min — ABNORMAL LOW (ref >60)
Glucose, Bld: 116 mg/dL — ABNORMAL HIGH (ref 70–99)
Potassium: 4.1 mmol/L (ref 3.5–5.1)
Sodium: 142 mmol/L (ref 135–145)

## 2022-04-01 LAB — CBC WITH DIFFERENTIAL/PLATELET
Abs Immature Granulocytes: 0.02 10*3/uL (ref 0.00–0.07)
Basophils Absolute: 0 10*3/uL (ref 0.0–0.1)
Basophils Relative: 0 %
Eosinophils Absolute: 0 10*3/uL (ref 0.0–0.5)
Eosinophils Relative: 0 %
HCT: 32.8 % — ABNORMAL LOW (ref 36.0–46.0)
Hemoglobin: 9.9 g/dL — ABNORMAL LOW (ref 12.0–15.0)
Immature Granulocytes: 0 %
Lymphocytes Relative: 19 %
Lymphs Abs: 0.9 10*3/uL (ref 0.7–4.0)
MCH: 26.1 pg (ref 26.0–34.0)
MCHC: 30.2 g/dL (ref 30.0–36.0)
MCV: 86.3 fL (ref 80.0–100.0)
Monocytes Absolute: 0.4 10*3/uL (ref 0.1–1.0)
Monocytes Relative: 9 %
Neutro Abs: 3.3 10*3/uL (ref 1.7–7.7)
Neutrophils Relative %: 72 %
Platelets: 165 10*3/uL (ref 150–400)
RBC: 3.8 MIL/uL — ABNORMAL LOW (ref 3.87–5.11)
RDW: 16.3 % — ABNORMAL HIGH (ref 11.5–15.5)
WBC: 4.6 10*3/uL (ref 4.0–10.5)
nRBC: 0 % (ref 0.0–0.2)

## 2022-04-01 LAB — C-REACTIVE PROTEIN: CRP: 1.6 mg/dL — ABNORMAL HIGH (ref <1.0)

## 2022-04-01 LAB — PHOSPHORUS: Phosphorus: 3.6 mg/dL (ref 2.5–4.6)

## 2022-04-01 LAB — MAGNESIUM: Magnesium: 2.2 mg/dL (ref 1.7–2.4)

## 2022-04-01 MED ORDER — LIP MEDEX EX OINT
TOPICAL_OINTMENT | CUTANEOUS | Status: DC | PRN
  Administered 2022-04-01: 1 via TOPICAL
  Filled 2022-04-01: qty 7

## 2022-04-01 MED ORDER — ISOSORBIDE MONONITRATE ER 30 MG PO TB24
30.0000 mg | ORAL_TABLET | Freq: Every day | ORAL | Status: DC
  Administered 2022-04-01 – 2022-04-03 (×3): 30 mg via ORAL
  Filled 2022-04-01 (×3): qty 1

## 2022-04-01 MED ORDER — LOPERAMIDE HCL 2 MG PO CAPS
2.0000 mg | ORAL_CAPSULE | ORAL | Status: DC | PRN
  Administered 2022-04-01: 2 mg via ORAL
  Filled 2022-04-01: qty 1

## 2022-04-01 MED ORDER — ONDANSETRON HCL 4 MG/2ML IJ SOLN
4.0000 mg | Freq: Three times a day (TID) | INTRAMUSCULAR | Status: DC | PRN
  Administered 2022-04-01: 4 mg via INTRAVENOUS
  Filled 2022-04-01: qty 2

## 2022-04-01 NOTE — Evaluation (Signed)
Occupational Therapy Evaluation Patient Details Name: [name] MRN: [id number] DOB: 06/15/1925 Today's Date: 04/01/2022   History of Present Illness Patient is a 88 year old female who was admitted with COIVD 19. PMH: COPD, CKD, anxiety, CVA and aortic stenosis   Clinical Impression   Patient is a 88 year old female who was admitted for above. Patient had recently transitioned to ALF for LTC just prior to hospitalization. Patient was min guard for transfer with O2 noted to drop to 88% on RA with transfer. Patient was noted to have decreased functional activity tolerance and increased need for O2 during session. Session was limited with arrival of lunch. patient receiving assistance from caregivers for bathing and dressing tasks at baseline. Patient would continue to benefit from skilled OT services at this time while admitted and after d/c to address noted deficits in order to improve overall safety and independence in ADLs.        Recommendations for follow up therapy are one component of a multi-disciplinary discharge planning process, led by the attending physician.  Recommendations may be updated based on patient status, additional functional criteria and insurance authorization.   Follow Up Recommendations  Home health OT    Assistance Recommended at Discharge Frequent or constant Supervision/Assistance  Patient can return home with the following A little help with walking and/or transfers;A little help with bathing/dressing/bathroom;Assistance with cooking/housework;Direct supervision/assist for financial management;Assist for transportation;Help with stairs or ramp for entrance;Direct supervision/assist for medications management    Functional Status Assessment  Patient has had a recent decline in their functional status and demonstrates the ability to make significant improvements in function in a reasonable and predictable amount of time.  Equipment Recommendations  None  recommended by OT    Recommendations for Other Services       Precautions / Restrictions Precautions Precautions: Fall Precaution Comments: monitor O2 Restrictions Weight Bearing Restrictions: No      Mobility Bed Mobility Overal bed mobility: Needs Assistance Bed Mobility: Supine to Sit     Supine to sit: Min guard, HOB elevated          Transfers                          Balance Overall balance assessment: Needs assistance Sitting-balance support: No upper extremity supported, Feet supported Sitting balance-Leahy Scale: Fair     Standing balance support: Bilateral upper extremity supported, Reliant on assistive device for balance, During functional activity Standing balance-Leahy Scale: Poor                             ADL either performed or assessed with clinical judgement   ADL Overall ADL's : Needs assistance/impaired Eating/Feeding: Set up;Sitting Eating/Feeding Details (indicate cue type and reason): patient was able to open ice cream cups with increased time and BUE during session. x2 sitting in recliner. patient and daughter were educated on the importance of sitting up in recliner for meals. patient and daughter verbalized understanding. Grooming: Set up;Sitting Grooming Details (indicate cue type and reason): patietn and daughter were educated on how to don hearing aids and needing to change batteries every so often. Upper Body Bathing: Set up;Sitting   Lower Body Bathing: Sit to/from stand;Sitting/lateral leans;Moderate assistance   Upper Body Dressing : Sitting;Set up   Lower Body Dressing: Minimal assistance;Sit to/from stand;Sitting/lateral leans   Toilet Transfer: Min guard;Rolling walker (2 wheels) Toilet Transfer Details (indicate  cue type and reason): to transfer from edge of bed to recliner in room. patient requested to do it herself. patient took off O2 with O2 noted to reach 88% on RA with transfer. patient was  provided with O2. Toileting- Water quality scientist and Hygiene: Sit to/from stand;Minimal assistance               Vision Baseline Vision/History: 6 Macular Degeneration Patient Visual Report: No change from baseline Additional Comments: daughter reported that L eye had been closed but was noted to be open during session today.     Perception     Praxis      Pertinent Vitals/Pain Pain Assessment Pain Assessment: Faces Faces Pain Scale: Hurts little more Pain Location: body with touch and back Pain Descriptors / Indicators: Discomfort, Aching, Sore Pain Intervention(s): Limited activity within patient's tolerance, Monitored during session     Hand Dominance Right   Extremity/Trunk Assessment Upper Extremity Assessment Upper Extremity Assessment: Generalized weakness (uanble to formally test stregnth with patients sensitivity to touch)   Lower Extremity Assessment Lower Extremity Assessment: Defer to PT evaluation   Cervical / Trunk Assessment Cervical / Trunk Assessment: Kyphotic   Communication Communication Communication: HOH   Cognition Arousal/Alertness: Awake/alert Behavior During Therapy: WFL for tasks assessed/performed Overall Cognitive Status: Within Functional Limits for tasks assessed                                 General Comments: motivated to participate in session. daughter present during session as well.     General Comments       Exercises     Shoulder Instructions      Home Living Family/patient expects to be discharged to:: Assisted living                             Home Equipment: Rolling Walker (2 wheels);Shower seat;Cane - single point;Grab bars - tub/shower;Grab bars - toilet          Prior Functioning/Environment Prior Level of Function : Independent/Modified Independent             Mobility Comments: uses RW for ambulation ADLs Comments: has aid help with bathing and dressing. ableto  complete toileting independently at RW level        OT Problem List: Decreased strength;Decreased activity tolerance;Impaired balance (sitting and/or standing);Decreased safety awareness;Decreased knowledge of precautions;Decreased knowledge of use of DME or AE;Cardiopulmonary status limiting activity      OT Treatment/Interventions: Self-care/ADL training;Therapeutic exercise;Neuromuscular education;DME and/or AE instruction;Energy conservation;Therapeutic activities;Balance training;Patient/family education    OT Goals(Current goals can be found in the care plan section) Acute Rehab OT Goals Patient Stated Goal: to get back to ALF OT Goal Formulation: With patient/family Time For Goal Achievement: 04/15/22 Potential to Achieve Goals: Fair  OT Frequency: Min 2X/week    Co-evaluation              AM-PAC OT "6 Clicks" Daily Activity     Outcome Measure Help from another person eating meals?: A Little Help from another person taking care of personal grooming?: A Little Help from another person toileting, which includes using toliet, bedpan, or urinal?: A Little Help from another person bathing (including washing, rinsing, drying)?: A Little Help from another person to put on and taking off regular upper body clothing?: A Little Help from another person to put on and taking off regular lower  body clothing?: A Lot 6 Click Score: 17   End of Session Equipment Utilized During Treatment: Gait belt;Rolling walker (2 wheels) Nurse Communication: Other (comment) (ok to participate in session)  Activity Tolerance: Patient tolerated treatment well Patient left: in chair;with call bell/phone within reach;with chair alarm set;with family/visitor present  OT Visit Diagnosis: Unsteadiness on feet (R26.81);Other abnormalities of gait and mobility (R26.89);Muscle weakness (generalized) (M62.81)                Time: 7530-0511 OT Time Calculation (min): 29 min Charges:  OT General  Charges $OT Visit: 1 Visit OT Evaluation $OT Eval Low Complexity: 1 Low OT Treatments $Therapeutic Activity: 8-22 mins  Rennie Plowman, MS Acute Rehabilitation Department Office# [phone]   Feliz Beam Laurey Salser 04/01/2022, 1:09 PM

## 2022-04-01 NOTE — Progress Notes (Signed)
PROGRESS NOTE    [name]  PNT:[id number] DOB: [birth year] DOA: 03/28/2022 PCP: [name], NP    Chief Complaint  Patient presents with   Shortness of Breath    Brief Narrative:   [name] is a 88 y.o. female with medical history significant for severe COPD on chronic corticosteroid therapy, chronic systolic/diastolic heart failure, moderate aortic stenosis, chronic kidney disease stage IIIb with baseline creatinine range 1.2-1.4, hypertension, hyperlipidemia, acquired hypothyroidism, who is admitted to [facility] on 03/28/2022 with severe COVID-19 infection after presenting from SNF to [facility] ED complaining of shortness of breath.   She underwent COVID-19 testing at SNF on 03/27/22, which was found to be positive.  In the setting of further progression of the above respiratory symptoms over the course the next day, she presents to [facility]. emergency department sooner for further evaluation management of the above.   The patient was admitted for further evaluation management of severe GQQPY-19 infection complicated by acute hypoxic respiratory distress as well as acute COPD exacerbation, criteria met for SIRS and presenting labs notable for acute hyponatremia.  Assessment & Plan:   Principal Problem:   COVID-19 virus infection Active Problems:   Hypothyroidism   Anemia of chronic disease   HLD (hyperlipidemia)   Stage 3b chronic kidney disease (CKD) (HCC)   COPD with acute exacerbation (HCC)   Acute respiratory failure with hypoxia (HCC)   Chronic combined systolic and diastolic heart failure (HCC)   Acute hyponatremia   Severe sepsis (HCC)   Acute respiratory failure with hypoxia requiring about 3 L of nasal cannula oxygen probably secondary to COVID-19 infection.,  With underlying COPD exacerbation. Influenza PCR is negative Chest x-ray does not show any acute cardiopulmonary disease at this time. Patient started on  IV Solu-Medrol, IV remdesivir and IV  doxycycline. she is transitioned to oral prednisone, oral doxycycline, and complete the course of remdesevir.  Inflammatory markers have improved.  Continue with DuoNebs, flutter valve and incentive spirometry. Procalcitonin is negative, CRP is 2.7  on admission to 1.9. lactic acid of 2.1. Follow blood cultures Pt reports feeling much better. Therapy eval recommending home health PT/ot  at ALF.   Chronic systolic heart failure:  BNP elevated, on admission,  ECHO showed LVEF of 45% .  One dose of lasix given yesterday.  She reports feeling much better.   SIRS criteria with leukopenia, tachycardia and tachypnea met on admission Resolved.     Hyponatremia Appears to have resolved at this time.    Anemia of chronic disease/iron deficiency anemia Hemoglobin stabilized at 9.   low iron levels. Will add iron supplementation.     Neutropenia probably secondary to infection Resolved.     Stage IIIb CKD  creatinine appears to be at baseline at this time.    Hypothyroidism Continue with Synthroid at 75 mcg daily.   Nausea and intermittent diarrhea:  Probably from the viral infection.  Supportive care with anti emetics.    In view of her multiple medical problems, poor prognosis, age, will request palliative care consult for goals of care.    DVT prophylaxis: Lovenox.  Code Status: DNR  Family Communication:none at bedside.  Disposition:   Status is: Inpatient Remains inpatient appropriate because: discharge in am.    Level of care: Progressive Consultants:  Palliative care.   Procedures: none.   Antimicrobials:  Antibiotics Given (last 72 hours)     Date/Time Action Medication Dose Rate   03/29/22 2036 New Bag/Given   doxycycline (  VIBRAMYCIN) 100 mg in sodium chloride 0.9 % 250 mL IVPB 100 mg 125 mL/hr   03/30/22 0853 New Bag/Given   doxycycline (VIBRAMYCIN) 100 mg in sodium chloride 0.9 % 250 mL IVPB 100 mg 125 mL/hr   03/30/22 1333 New Bag/Given    remdesivir 100 mg in sodium chloride 0.9 % 100 mL IVPB 100 mg 200 mL/hr   03/30/22 2031 Given   doxycycline (VIBRA-TABS) tablet 100 mg 100 mg    03/31/22 1235 Given   doxycycline (VIBRA-TABS) tablet 100 mg 100 mg    03/31/22 2037 Given   doxycycline (VIBRA-TABS) tablet 100 mg 100 mg    04/01/22 1056 Given   doxycycline (VIBRA-TABS) tablet 100 mg 100 mg          Subjective: Nauseated and 1 episode of loose BM.  No abd pain.   Objective: Vitals:   04/01/22 0414 04/01/22 0858 04/01/22 1341 04/01/22 1413  BP:   (!) 164/90   Pulse:   61   Resp:   18   Temp:   97.7 F (36.5 C)   TempSrc:   Oral   SpO2:  95% 99% 95%  Weight: 58.8 kg     Height:        Intake/Output Summary (Last 24 hours) at 04/01/2022 1459 Last data filed at 04/01/2022 1400 Gross per 24 hour  Intake 780 ml  Output 0 ml  Net 780 ml    Filed Weights   03/30/22 0500 03/31/22 0500 04/01/22 0414  Weight: 59 kg 60.7 kg 58.8 kg    Examination:  General exam: ill appearing elderly woman , not in distress.  Respiratory system: air entry fair, no wheezing heard, still on 3 lit of [place] oxygen.  Cardiovascular system: S1 & S2 heard, RRR. No JVD,  No pedal edema. Gastrointestinal system: Abdomen is nondistended, soft and nontender. Normal bowel sounds heard. Central nervous system: Alert and oriented. No focal neurological deficits. Extremities: Symmetric 5 x 5 power. Skin: No rashes, lesions or ulcers Psychiatry: Mood & affect appropriate.        Data Reviewed: I have personally reviewed following labs and imaging studies  CBC: Recent Labs  Lab 03/28/22 1501 03/29/22 0119 03/30/22 0353 03/31/22 0351 04/01/22 0411  WBC 2.9* 1.4* 4.7 5.5 4.6  NEUTROABS 1.9 1.1* 3.3 3.8 3.3  HGB 9.3* 9.1* 8.6* 9.0* 9.9*  HCT 30.7* 30.6* 28.6* 30.5* 32.8*  MCV 86.0 86.9 87.7 87.9 86.3  PLT 159 150 152 145* 165     Basic Metabolic Panel: Recent Labs  Lab 03/28/22 1501 03/29/22 0119 03/30/22 0353  03/31/22 0351 04/01/22 0411  NA 131* 137 139 140 142  K 3.7 3.5 3.6 3.3* 4.1  CL 98 104 108 106 108  CO2 $Re'25 24 25 28 28  'MMw$ GLUCOSE 195* 189* 112* 104* 116*  BUN 24* 26* 38* 42* 39*  CREATININE 1.18* 1.22* 1.14* 1.32* 1.09*  CALCIUM 7.9* 8.0* 8.3* 8.0* 8.4*  MG 2.0 2.6* 2.5* 2.2 2.2  PHOS  --  3.9 3.9 3.8 3.6     GFR: Estimated Creatinine Clearance: 29.3 mL/min (A) (by C-G formula based on SCr of 1.09 mg/dL (H)).  Liver Function Tests: Recent Labs  Lab 03/28/22 1501 03/29/22 0119  AST 34 34  ALT 26 30  ALKPHOS 76 76  BILITOT 0.7 0.9  PROT 6.0* 6.0*  ALBUMIN 3.2* 3.1*     CBG: No results for input(s): "GLUCAP" in the last 168 hours.   Recent Results (from the past 240 hour(s))  Resp Panel by RT-PCR (Flu A&B, Covid) Anterior Nasal Swab     Status: Abnormal   Collection Time: 03/28/22  2:57 PM   Specimen: Anterior Nasal Swab  Result Value Ref Range Status   SARS Coronavirus 2 by RT PCR POSITIVE (A) NEGATIVE Final    Comment: (NOTE) SARS-CoV-2 target nucleic acids are DETECTED.  The SARS-CoV-2 RNA is generally detectable in upper respiratory specimens during the acute phase of infection. Positive results are indicative of the presence of the identified virus, but do not rule out bacterial infection or co-infection with other pathogens not detected by the test. Clinical correlation with patient history and other diagnostic information is necessary to determine patient infection status. The expected result is Negative.  Fact Sheet for Patients: EntrepreneurPulse.com.au  Fact Sheet for Healthcare Providers: IncredibleEmployment.be  This test is not yet approved or cleared by the [place] FDA and  has been authorized for detection and/or diagnosis of SARS-CoV-2 by FDA under an Emergency Use Authorization (EUA).  This EUA will remain in effect (meaning this test can be used) for the duration of  the COVID-19 declaration under  Section 564(b)(1) of the A ct, 21 U.S.C. section 360bbb-3(b)(1), unless the authorization is terminated or revoked sooner.     Influenza A by PCR NEGATIVE NEGATIVE Final   Influenza B by PCR NEGATIVE NEGATIVE Final    Comment: (NOTE) The Xpert Xpress SARS-CoV-2/FLU/RSV plus assay is intended as an aid in the diagnosis of influenza from Nasopharyngeal swab specimens and should not be used as a sole basis for treatment. Nasal washings and aspirates are unacceptable for Xpert Xpress SARS-CoV-2/FLU/RSV testing.  Fact Sheet for Patients: EntrepreneurPulse.com.au  Fact Sheet for Healthcare Providers: IncredibleEmployment.be  This test is not yet approved or cleared by the [place] FDA and has been authorized for detection and/or diagnosis of SARS-CoV-2 by FDA under an Emergency Use Authorization (EUA). This EUA will remain in effect (meaning this test can be used) for the duration of the COVID-19 declaration under Section 564(b)(1) of the Act, 21 U.S.C. section 360bbb-3(b)(1), unless the authorization is terminated or revoked.  Performed at [facility], [place] [address] [place], [place] [address]   Culture, blood (Routine X 2) w Reflex to ID Panel     Status: None (Preliminary result)   Collection Time: 03/28/22 10:36 PM   Specimen: BLOOD  Result Value Ref Range Status   Specimen Description   Final    BLOOD RIGHT ANTECUBITAL Performed at [place] [address] [place], [place] [address]    Special Requests   Final    BOTTLES DRAWN AEROBIC ONLY Blood Culture adequate volume Performed at [place] [address] [place], [place] [address]    Culture   Final    NO GROWTH 2 DAYS Performed at [facility] [address] [place], [place] [address]    Report Status PENDING  Incomplete  Culture, blood (Routine X 2) w Reflex to ID Panel     Status: None (Preliminary  result)   Collection Time: 03/28/22 10:41 PM   Specimen: BLOOD  Result Value Ref Range Status   Specimen Description   Final    BLOOD RIGHT ANTECUBITAL Performed at [facility] [address] [place], [place] [address]    Special Requests   Final    BOTTLES DRAWN AEROBIC ONLY Blood Culture adequate volume Performed at [facility] [address] [place],  [address]  Culture   Final    NO GROWTH 2 DAYS Performed at [facility], [place] [address], [place], [place] [address]    Report Status PENDING  Incomplete         Radiology Studies: ECHOCARDIOGRAM COMPLETE  Result Date: 03/31/2022    ECHOCARDIOGRAM REPORT   Patient Name:   NAIMAH YINGST Date of Exam: 03/31/2022 Medical Rec #:  [id number]     Height:       66.0 in Accession #:    [id number]    Weight:       133.8 lb Date of Birth:  02-03-1925    BSA:          1.686 m Patient Age:    23 years      BP:           167/95 mmHg Patient Gender: F             HR:           66 bpm. Exam Location:  Inpatient Procedure: 2D Echo Indications:    Dyspnea  History:        Patient has prior history of Echocardiogram examinations, most                 recent 12/25/2020. CHF, Stroke; Risk Factors:Former Smoker,                 Hypertension and Dyslipidemia.  Sonographer:    Harvie Junior Referring Phys: Theola Sequin  Sonographer Comments: Technically difficult study due to poor echo windows. Image acquisition challenging due to COPD, supine and Image acquisition challenging due to respiratory motion. IMPRESSIONS  1. Left ventricular ejection fraction, by estimation, is 40 to 45%. The left ventricle has mildly decreased function. The left ventricle demonstrates global hypokinesis. There is mild left ventricular hypertrophy. Left ventricular diastolic parameters are consistent with Grade I diastolic dysfunction (impaired relaxation).  2. Right ventricular systolic function is normal. The right  ventricular size is normal. There is normal pulmonary artery systolic pressure. The estimated right ventricular systolic pressure is 65.4 mmHg.  3. Left atrial size was mildly dilated.  4. The mitral valve is normal in structure. Trivial mitral valve regurgitation.  5. The inferior vena cava is dilated in size with <50% respiratory variability, suggesting right atrial pressure of 15 mmHg.  6. The aortic valve is calcified. There is severe calcification of the aortic valve. Aortic valve regurgitation is trivial. Severe aortic valve stenosis. Moderate AS by gradients (Vmax 3.1 m/s, MG 22mmHg), severe by AVA (0.7 cm^2) and DI (0.25). Low SV index (31 cc/m^2), suspect low flow low gradient severe AS FINDINGS  Left Ventricle: Left ventricular ejection fraction, by estimation, is 40 to 45%. The left ventricle has mildly decreased function. The left ventricle demonstrates global hypokinesis. The left ventricular internal cavity size was normal in size. There is  mild left ventricular hypertrophy. Left ventricular diastolic parameters are consistent with Grade I diastolic dysfunction (impaired relaxation). Right Ventricle: The right ventricular size is normal. No increase in right ventricular wall thickness. Right ventricular systolic function is normal. There is normal pulmonary artery systolic pressure. The tricuspid regurgitant velocity is 2.10 m/s, and  with an assumed right atrial pressure of 15 mmHg, the estimated right ventricular systolic pressure is 65.0 mmHg. Left Atrium: Left atrial size was mildly dilated. Right Atrium: Right atrial size was normal in size. Pericardium: There is no evidence of pericardial effusion. Mitral Valve: The mitral valve is normal in  structure. Trivial mitral valve regurgitation. Tricuspid Valve: The tricuspid valve is normal in structure. Tricuspid valve regurgitation is trivial. Aortic Valve: The aortic valve is calcified. There is severe calcifcation of the aortic valve. Aortic valve  regurgitation is trivial. Aortic regurgitation PHT measures 802 msec. Moderate to severe aortic stenosis is present. Aortic valve mean gradient measures 19.4 mmHg. Aortic valve peak gradient measures 36.0 mmHg. Aortic valve area, by VTI measures 0.78 cm. Pulmonic Valve: The pulmonic valve was not well visualized. Pulmonic valve regurgitation is not visualized. Aorta: The aortic root and ascending aorta are structurally normal, with no evidence of dilitation. Venous: The inferior vena cava is dilated in size with less than 50% respiratory variability, suggesting right atrial pressure of 15 mmHg. IAS/Shunts: The interatrial septum was not well visualized. Additional Comments: There is pleural effusion in the left lateral region.  LEFT VENTRICLE PLAX 2D LVIDd:         4.80 cm      Diastology LVIDs:         3.60 cm      LV e' medial:    4.05 cm/s LV PW:         1.10 cm      LV E/e' medial:  16.0 LV IVS:        1.10 cm      LV e' lateral:   6.38 cm/s LVOT diam:     1.90 cm      LV E/e' lateral: 10.1 LV SV:         52 LV SV Index:   31 LVOT Area:     2.84 cm  LV Volumes (MOD) LV vol d, MOD A2C: 106.0 ml LV vol d, MOD A4C: 79.6 ml LV vol s, MOD A2C: 60.7 ml LV vol s, MOD A4C: 45.1 ml LV SV MOD A2C:     45.3 ml LV SV MOD A4C:     79.6 ml LV SV MOD BP:      45.2 ml RIGHT VENTRICLE RV Basal diam:  3.10 cm RV Mid diam:    2.60 cm RV S prime:     13.20 cm/s TAPSE (M-mode): 1.7 cm LEFT ATRIUM             Index        RIGHT ATRIUM           Index LA diam:        3.10 cm 1.84 cm/m   RA Area:     12.10 cm LA Vol (A2C):   81.8 ml 48.52 ml/m  RA Volume:   25.50 ml  15.13 ml/m LA Vol (A4C):   51.2 ml 30.37 ml/m LA Biplane Vol: 65.8 ml 39.03 ml/m  AORTIC VALVE                     PULMONIC VALVE AV Area (Vmax):    0.72 cm      PV Vmax:       1.37 m/s AV Area (Vmean):   0.67 cm      PV Peak grad:  7.5 mmHg AV Area (VTI):     0.78 cm AV Vmax:           299.80 cm/s AV Vmean:          206.200 cm/s AV VTI:            0.672 m AV  Peak Grad:      36.0 mmHg AV Mean Grad:  19.4 mmHg LVOT Vmax:         75.80 cm/s LVOT Vmean:        48.400 cm/s LVOT VTI:          0.185 m LVOT/AV VTI ratio: 0.28 AI PHT:            802 msec  AORTA Ao Root diam: 3.00 cm Ao Asc diam:  2.70 cm MITRAL VALVE                TRICUSPID VALVE MV Area (PHT): 3.00 cm     TR Peak grad:   17.6 mmHg MV Decel Time: 253 msec     TR Vmax:        210.00 cm/s MR Peak grad: 66.9 mmHg MR Vmax:      409.00 cm/s   SHUNTS MV E velocity: 64.70 cm/s   Systemic VTI:  0.18 m MV A velocity: 102.00 cm/s  Systemic Diam: 1.90 cm MV E/A ratio:  0.63 Oswaldo Milian MD Electronically signed by Oswaldo Milian MD Signature Date/Time: 03/31/2022/12:36:34 PM    Final         Scheduled Meds:  carbamide peroxide  5 drop Both EARS BID   doxycycline  100 mg Oral Q12H   enoxaparin (LOVENOX) injection  30 mg Subcutaneous Q24H   ipratropium-albuterol  3 mL Nebulization TID   isosorbide mononitrate  30 mg Oral Daily   latanoprost  1 drop Both Eyes QHS   levothyroxine  75 mcg Oral QAC breakfast   pantoprazole  40 mg Oral Daily   predniSONE  50 mg Oral Daily   Continuous Infusions:     LOS: 4 days        Hosie Poisson, MD [facility]   To contact the attending provider between 7A-7P or the covering provider during after hours 7P-7A, please log into the web site [email or web address] and access using universal [place] password for that web site. If you do not have the password, please call the hospital operator.  04/01/2022, 2:59 PM

## 2022-04-01 NOTE — Care Management Important Message (Signed)
Important Message  Patient Details IM Letter placed in Patients room. Name: [name] MRN: [id number] Date of Birth: 1925/03/14   Medicare Important Message Given:  Yes     Kerin Salen 04/01/2022, 1:41 PM

## 2022-04-01 NOTE — Plan of Care (Signed)

## 2022-04-02 DIAGNOSIS — U071 COVID-19: Secondary | ICD-10-CM | POA: Diagnosis not present

## 2022-04-02 DIAGNOSIS — E871 Hypo-osmolality and hyponatremia: Secondary | ICD-10-CM | POA: Diagnosis not present

## 2022-04-02 DIAGNOSIS — D638 Anemia in other chronic diseases classified elsewhere: Secondary | ICD-10-CM | POA: Diagnosis not present

## 2022-04-02 DIAGNOSIS — J9601 Acute respiratory failure with hypoxia: Secondary | ICD-10-CM | POA: Diagnosis not present

## 2022-04-02 LAB — MRSA NEXT GEN BY PCR, NASAL: MRSA by PCR Next Gen: NOT DETECTED

## 2022-04-02 NOTE — Evaluation (Signed)
Clinical/Bedside Swallow Evaluation Patient Details  Name: NAZIA RHINES MRN: [id number] Date of Birth: 05/15/1925  Today's Date: 04/02/2022 Time: SLP Start Time (ACUTE ONLY): 10 SLP Stop Time (ACUTE ONLY): 1915 SLP Time Calculation (min) (ACUTE ONLY): 30 min  Past Medical History:  Past Medical History:  Diagnosis Date   Anxiety    Aortic arch atherosclerosis ([place]) 06/24/2014   Atherosclerotic ulcer of aorta ([place]) 06/24/2014   Carotid artery occlusion    Chronic combined systolic and diastolic heart failure ([place]) 09/01/2020   Chronic kidney disease    COPD (chronic obstructive pulmonary disease) ([place])    [place] DISEASE, LUMBAR 12/16/2008   DIVERTICULOSIS, COLON 09/30/2008   DYSPNEA 07/13/2008   [place]ves disease    History of embolic stroke 3/55/7322   Left brain   HYPERLIPIDEMIA 03/06/2007   HYPERTENSION 03/06/2007   HYPOTHYROIDISM 10/13/2007   OSTEOARTHRITIS 03/06/2007   Personal history of colonic polyps 09/30/2008   Stroke ([place])    06/2014            TOBACCO USE, QUIT 04/12/2009   WEAKNESS 11/09/2007   Past Surgical History:  Past Surgical History:  Procedure Laterality Date   CARDIAC CATHETERIZATION     CATARACT EXTRACTION Bilateral    CHOLECYSTECTOMY     ENDARTERECTOMY Left 11/13/2015   Procedure: LEFT CAROTID ARTERY ENDARTERECTOMY;  Surgeon: Conrad Weston, MD;  Location: [facility] OR;  Service: Vascular;  Laterality: Left;   ESOPHAGOGASTRODUODENOSCOPY (EGD) WITH PROPOFOL N/A 10/11/2016   Procedure: ESOPHAGOGASTRODUODENOSCOPY (EGD) WITH PROPOFOL;  Surgeon: Mauri Pole, MD;  Location: [facility] ENDOSCOPY;  Service: Endoscopy;  Laterality: N/A;   KNEE SURGERY     PATCH ANGIOPLASTY Left 11/13/2015   Procedure: WITH 1CM X 6CM  XENOSURE BIOLOGIC PATCH ANGIOPLASTY;  Surgeon: Conrad [place], MD;  Location: [place];  Service: Vascular;  Laterality: Left;   TEE WITHOUT CARDIOVERSION N/A 06/24/2014   Procedure: TRANSESOPHAGEAL ECHOCARDIOGRAM (TEE);  Surgeon: Sanda Klein, MD;  Location: [facility] ENDOSCOPY;   Service: Cardiovascular;  Laterality: N/A;   HPI:  88 yo female adm to [facility] with AMS - found to have COVID+.  Pt was to dc today but dc was put off due to pt reporting issues with swallowing.  Per notes from adm coordinator, daughter reported pt has anxiety, get ativan at home when upset and this effectively calms her.  She did have prior LEFT CEA in 2017 and reported dysphagia at that time - MBS was completed 12/13/2015 as OP due to pt choking with intake. This SLP reviewed the flouro loops of the study - no penetration or aspiration noted and swallow was strong.  Noted decreased sustained airway closure with sequential swallows. Upon esophageal sweep, pt appeared with residual .  She has known h/o smoking, GERD and has taken a PPI for a "long time " - "same dosage".  Some weight loss reported and mandibular bone atrophy resulting in impaired fit of lower teeth - she uses Fixodent to assist.  Daughter reports she suspects pt is having TIAs (and pt has h/o CVA) - noted left facial asymmetry - daughter reports pt with prior right sided weakness. Daughter also endorses pt having right hand discoordination (this was also noted during prior hosptial admit when pt was worked up for CVA).   Pt underwent prior EGD in 2018 due to concern for UGI bleed - EGD revealed "One mild (non-circumferential scarring) benign-appearing, intrinsic stenosis was found 36 to 37 cm from the incisors. This measured 2 cm (inner diameter) and was traversed"  Assessment / Plan / Recommendation  Clinical Impression  Pt presents with functional oropharyngeal swallow based on clinical swallow evaluation.  Mild left lower facial asymmetry present  but pt without other focal CN deficits.   Observed pt feeding herself - No clinical indications of aspiration within intake including salmon, roll, and water. She did not pass the 3 ounce [place] but only because she needed a break - consumed nearly all the 3 ounces and did not cough after swallow.   Swallow clinically judged to be timely.  Pt did report sensation of "something sticking" on the right side of lower throat during intake and said she would normally wait for it to clear when occuring during intake PTA.  Pt only waited approx 1.25 minutes prior to drinking more liquids. Xerostomia reported - and pt is on oxygen *not on any PTA* and in dry environment, thus advised she start all intake with liquids.  Given her h/o GERD and taking a PPI -suspect potential exacerbation of reflux and potential esophageal dysmotility. Suspect pt had referrant sensation to pharynx from distal esophagus (on MBS 2017, she appeared with mild residual - MBS not designed to examine esophageal function).   SLP spoke to granddaughter Mendel Ryder* on the phone who is an SLP with adults and discussed findings/suspicions.  Ria Comment, pt's daughter [name] and pt all agreeable with plan to continue po with dysphagia/reflux mitigation strategies.  Advised pt start all intake with liquids, masticate well, consume liquids during meal *warm*, eat several small meals and rest if sense globus that does not able or becomes significantly dyspenic.  In absence of pt having recurrent pneumonias, worsening dysphagia symptoms, SLP would not recommend instrumental swallow evaluation as do not anticipate it will change pt's outcomes.  Hopefully as medical status continues to improve, pt's swallow difficulties will abate to baseline level. Using teach back, all education completed. Thank you for this consult. SLP Visit Diagnosis: Dysphagia, unspecified (R13.10)    Aspiration Risk  Mild aspiration risk    Diet Recommendation Regular;Thin liquid   Liquid Administration via: Cup;Straw Medication Administration: Whole meds with liquid (potassium with applesauce - start and follow with liquids) Supervision: Patient able to self feed Compensations: Slow rate;Small sips/bites Postural Changes: Remain upright for at least 30 minutes after po  intake;Seated upright at 90 degrees    Other  Recommendations Oral Care Recommendations: Oral care BID    Recommendations for follow up therapy are one component of a multi-disciplinary discharge planning process, led by the attending physician.  Recommendations may be updated based on patient status, additional functional criteria and insurance authorization.  Follow up Recommendations No SLP follow up      Assistance Recommended at Discharge None  Functional Status Assessment Patient has not had a recent decline in their functional status  Frequency and Duration   N/a         Prognosis   N/a     Swallow Study   General Date of Onset: 04/02/22 HPI: 88 yo female adm to [facility] with AMS - found to have COVID+.  Pt was to dc today but dc was put off due to pt reporting issues with swallowing.  Per notes from adm coordinator, daughter reported pt has anxiety, get ativan at home when upset and this effectively calms her.  She did have prior LEFT CEA in 2017 and reported dysphagia at that time - MBS was completed 12/13/2015 as OP due to pt choking with intake. This SLP reviewed the flouro loops of the study -  no penetration or aspiration noted and swallow was strong.  Noted decreased sustained airway closure with sequential swallows. Upon esophageal sweep, pt appeared with residual .  She has known h/o smoking, GERD and has taken a PPI for a "long time " - "same dosage".  Some weight loss reported and mandibular bone atrophy resulting in impaired fit of lower teeth - she uses Fixodent to assist.  Daughter reports she suspects pt is having TIAs (and pt has h/o CVA) - noted left facial asymmetry - daughter reports pt with prior right sided weakness. Daughter also endorses pt having right hand discoordination (this was also noted during prior hosptial admit when pt was worked up for CVA). Previous Swallow Assessment: see HPI Diet Prior to this Study: Regular;Thin liquids Temperature Spikes Noted:  No Respiratory Status: Nasal cannula (3 liters) History of Recent Intubation: No Behavior/Cognition: Alert;Cooperative;Pleasant mood Oral Cavity Assessment: Within Functional Limits Oral Care Completed by SLP: No Oral Cavity - Dentition: Dentures, top;Dentures, bottom Vision: Functional for self-feeding (with bright light on above the bed for meals - off when not eating) Self-Feeding Abilities: Able to feed self;Needs set up Patient Positioning: Upright in chair Baseline Vocal Quality: Normal Volitional Cough: Strong Volitional Swallow: Able to elicit    Oral/Motor/Sensory Function Overall Oral Motor/Sensory Function: Mild impairment Facial ROM: Within Functional Limits Facial Symmetry: Abnormal symmetry left Facial Strength: Within Functional Limits Facial Sensation: Within Functional Limits Lingual ROM: Within Functional Limits Lingual Symmetry: Within Functional Limits Lingual Strength: Within Functional Limits Velum: Within Functional Limits Mandible: Within Functional Limits   Ice Chips Ice chips: Not tested (pt does not like ice)   Thin Liquid Thin Liquid: Within functional limits Presentation: Cup;Self Fed    Nectar Thick Nectar Thick Liquid: Not tested   Honey Thick Honey Thick Liquid: Not tested   Puree Puree: Within functional limits Presentation: Self Fed;Spoon   Solid     Solid: Within functional limits Presentation: Self Fredirick Lathe 04/02/2022,8:08 PM  Kathleen Lime, MS [facility] SLP [place] Office [phone] Pager [phone]

## 2022-04-02 NOTE — Progress Notes (Signed)
Physical Therapy Treatment Patient Details Name: [name] MRN: [id number] DOB: 1925/05/21 Today's Date: 04/02/2022   History of Present Illness Patient is a 88 year old female who was admitted with COIVD 19. PMH: COPD, CKD, anxiety, CVA and aortic stenosis    PT Comments    Progressing with mobility. Pt reports feeling okay on today-has had some trouble with breathing. She walked around the room x 2. After resting, she walked ~50 feet in the hallway with a RW. O2 91% on 3L. Pt expresses concerns about being able to safely mobilize at home with O2 tanks.     Recommendations for follow up therapy are one component of a multi-disciplinary discharge planning process, led by the attending physician.  Recommendations may be updated based on patient status, additional functional criteria and insurance authorization.  Follow Up Recommendations  Home health PT (at ALF)     Assistance Recommended at Discharge Intermittent Supervision/Assistance  Patient can return home with the following A little help with walking and/or transfers;A little help with bathing/dressing/bathroom;Assistance with cooking/housework;Help with stairs or ramp for entrance;Assist for transportation   Equipment Recommendations  None recommended by PT    Recommendations for Other Services       Precautions / Restrictions Precautions Precautions: Fall Precaution Comments: monitor O2 Restrictions Weight Bearing Restrictions: No     Mobility  Bed Mobility               General bed mobility comments: oob in recliner    Transfers Overall transfer level: Needs assistance Equipment used: Rolling walker (2 wheels) Transfers: Sit to/from Stand Sit to Stand: Min guard           General transfer comment: Min guard for safety. Cues for hand placement.    Ambulation/Gait Ambulation/Gait assistance: Min guard Gait Distance (Feet): 50 Feet Assistive device: Rolling walker (2 wheels) Gait  Pattern/deviations: Step-through pattern, Decreased stride length       General Gait Details: Close Min guard A. O2 91% on 3L   Stairs             Wheelchair Mobility    Modified Rankin (Stroke Patients Only)       Balance Overall balance assessment: Needs assistance         Standing balance support: Bilateral upper extremity supported, Reliant on assistive device for balance, During functional activity Standing balance-Leahy Scale: Fair                              Cognition Arousal/Alertness: Awake/alert Behavior During Therapy: WFL for tasks assessed/performed Overall Cognitive Status: Within Functional Limits for tasks assessed                                          Exercises Other Exercises Other Exercises: Sit to stands: 1st round (5 reps), 2nd round (10 reps)    General Comments        Pertinent Vitals/Pain Pain Assessment Pain Assessment: Faces Pain Location: body with touch (sensitive skin) and back Pain Descriptors / Indicators: Tender, Sore, Aching, Discomfort Pain Intervention(s): Limited activity within patient's tolerance, Monitored during session, Repositioned    Home Living                          Prior Function  PT Goals (current goals can now be found in the care plan section) Progress towards PT goals: Progressing toward goals    Frequency    Min 3X/week      PT Plan Current plan remains appropriate    Co-evaluation              AM-PAC PT "6 Clicks" Mobility   Outcome Measure  Help needed turning from your back to your side while in a flat bed without using bedrails?: None Help needed moving from lying on your back to sitting on the side of a flat bed without using bedrails?: None Help needed moving to and from a bed to a chair (including a wheelchair)?: A Little Help needed standing up from a chair using your arms (e.g., wheelchair or bedside chair)?: A  Little Help needed to walk in hospital room?: A Little Help needed climbing 3-5 steps with a railing? : A Little 6 Click Score: 20    End of Session Equipment Utilized During Treatment: Gait belt;Oxygen Activity Tolerance: Patient limited by fatigue Patient left: in chair;with call bell/phone within reach;with chair alarm set   PT Visit Diagnosis: Muscle weakness (generalized) (M62.81);Difficulty in walking, not elsewhere classified (R26.2)     Time: 2409-7353 PT Time Calculation (min) (ACUTE ONLY): 28 min  Charges:  $Gait Training: 23-37 mins                         Doreatha Massed, PT Acute Rehabilitation  Office: [phone] Pager: [phone]

## 2022-04-02 NOTE — Progress Notes (Signed)
PROGRESS NOTE    [name]  JSE:[id number] DOB: 23-Jan-1925 DOA: 03/28/2022 PCP: [name], NP    Chief Complaint  Patient presents with   Shortness of Breath    Brief Narrative:   [name] is a 88 y.o. female with medical history significant for severe COPD on chronic corticosteroid therapy, chronic systolic/diastolic heart failure, moderate aortic stenosis, chronic kidney disease stage IIIb with baseline creatinine range 1.2-1.4, hypertension, hyperlipidemia, acquired hypothyroidism, who is admitted to [facility] on 03/28/2022 with severe COVID-19 infection after presenting from SNF to [facility] ED complaining of shortness of breath.   She underwent COVID-19 testing at SNF on 03/27/22, which was found to be positive.  In the setting of further progression of the above respiratory symptoms over the course the next day, she presents to [facility] emergency department sooner for further evaluation management of the above.   The patient was admitted for further evaluation management of severe WVPXT-06 infection complicated by acute hypoxic respiratory distress as well as acute COPD exacerbation, criteria met for SIRS and presenting labs notable for acute hyponatremia.  Assessment & Plan:   Principal Problem:   COVID-19 virus infection Active Problems:   Hypothyroidism   Anemia of chronic disease   HLD (hyperlipidemia)   Stage 3b chronic kidney disease (CKD) (HCC)   COPD with acute exacerbation (HCC)   Acute respiratory failure with hypoxia (HCC)   Chronic combined systolic and diastolic heart failure (HCC)   Acute hyponatremia   Severe sepsis (HCC)   Acute respiratory failure with hypoxia requiring about 3 L of nasal cannula oxygen probably secondary to COVID-19 infection.,  With underlying COPD exacerbation. Influenza PCR is negative Chest x-ray does not show any acute cardiopulmonary disease at this time. Patient started on  IV Solu-Medrol, IV remdesivir and IV  doxycycline. she is transitioned to oral prednisone, plan for a quick taper, continue with  oral doxycycline, and completed the course of remdesevir.  Inflammatory markers have improved.  Continue with DuoNebs, flutter valve and incentive spirometry. Procalcitonin is negative, blood cultures are negative.  Therapy eval recommending home health PT/ot  at ALF.   Chronic systolic heart failure:  BNP elevated, on admission,  ECHO showed LVEF of 45% .  One dose of lasix given  with a slight improvement in breathing.    SIRS criteria with leukopenia, tachycardia and tachypnea met on admission Resolved.     Hyponatremia Appears to have resolved at this time.    Anemia of chronic disease/iron deficiency anemia Hemoglobin stabilized at 9.   low iron levels. Will add iron supplementation.     Neutropenia probably secondary to infection Resolved.     Stage IIIb CKD  creatinine appears to be at baseline at this time.    Hypothyroidism Continue with Synthroid at 75 mcg daily.   Nausea and intermittent diarrhea:  Probably from the viral infection.  Supportive care with anti emetics.  She reports difficulty swallowing, unable to eat her lunch. SLP eval ordered for further evaluation.    In view of her multiple medical problems, poor prognosis, age, will request palliative care consult for goals of care.    DVT prophylaxis: Lovenox.  Code Status: DNR  Family Communication:none at bedside.  Disposition:   Status is: Inpatient Remains inpatient appropriate because: she should be good for discharge tomorrow once SLP is done. .    Level of care: Progressive Consultants:  Palliative care.   Procedures: none.   Antimicrobials:  Antibiotics Given (last 72  hours)     Date/Time Action Medication Dose   03/30/22 2031 Given   doxycycline (VIBRA-TABS) tablet 100 mg 100 mg   03/31/22 1235 Given   doxycycline (VIBRA-TABS) tablet 100 mg 100 mg   03/31/22 2037 Given    doxycycline (VIBRA-TABS) tablet 100 mg 100 mg   04/01/22 1056 Given   doxycycline (VIBRA-TABS) tablet 100 mg 100 mg   04/01/22 2100 Given   doxycycline (VIBRA-TABS) tablet 100 mg 100 mg   04/02/22 1245 Given   doxycycline (VIBRA-TABS) tablet 100 mg 100 mg         Subjective: No diarrhea, or vomiting. Difficulty swallowing.  Feels terrible today.   Objective: Vitals:   04/02/22 0500 04/02/22 0853 04/02/22 1446 04/02/22 1501  BP:   (!) 148/80   Pulse:   60   Resp:   20   Temp:   98.6 F (37 C)   TempSrc:   Oral   SpO2:  96% 100% 96%  Weight: 59.3 kg     Height:        Intake/Output Summary (Last 24 hours) at 04/02/2022 1556 Last data filed at 04/02/2022 1253 Gross per 24 hour  Intake 720 ml  Output 0 ml  Net 720 ml    Filed Weights   03/31/22 0500 04/01/22 0414 04/02/22 0500  Weight: 60.7 kg 58.8 kg 59.3 kg    Examination:  General exam: ill appearing lady , elderly, not in distress.  Respiratory system: scattered wheezing heard, on 3 lit [place] oxygen.  Cardiovascular system: S1 & S2 heard, RRR. No JVD, No pedal edema. Gastrointestinal system: Abdomen is nondistended, soft and nontender.  Normal bowel sounds heard. Central nervous system: Alert and oriented. No focal neurological deficits. Extremities: Symmetric 5 x 5 power. Skin: No rashes,  Psychiatry: Mood & affect appropriate.         Data Reviewed: I have personally reviewed following labs and imaging studies  CBC: Recent Labs  Lab 03/28/22 1501 03/29/22 0119 03/30/22 0353 03/31/22 0351 04/01/22 0411  WBC 2.9* 1.4* 4.7 5.5 4.6  NEUTROABS 1.9 1.1* 3.3 3.8 3.3  HGB 9.3* 9.1* 8.6* 9.0* 9.9*  HCT 30.7* 30.6* 28.6* 30.5* 32.8*  MCV 86.0 86.9 87.7 87.9 86.3  PLT 159 150 152 145* 165     Basic Metabolic Panel: Recent Labs  Lab 03/28/22 1501 03/29/22 0119 03/30/22 0353 03/31/22 0351 04/01/22 0411  NA 131* 137 139 140 142  K 3.7 3.5 3.6 3.3* 4.1  CL 98 104 108 106 108  CO2 $Re'25 24 25 28 28   'Rtd$ GLUCOSE 195* 189* 112* 104* 116*  BUN 24* 26* 38* 42* 39*  CREATININE 1.18* 1.22* 1.14* 1.32* 1.09*  CALCIUM 7.9* 8.0* 8.3* 8.0* 8.4*  MG 2.0 2.6* 2.5* 2.2 2.2  PHOS  --  3.9 3.9 3.8 3.6     GFR: Estimated Creatinine Clearance: 29.5 mL/min (A) (by C-G formula based on SCr of 1.09 mg/dL (H)).  Liver Function Tests: Recent Labs  Lab 03/28/22 1501 03/29/22 0119  AST 34 34  ALT 26 30  ALKPHOS 76 76  BILITOT 0.7 0.9  PROT 6.0* 6.0*  ALBUMIN 3.2* 3.1*     CBG: No results for input(s): "GLUCAP" in the last 168 hours.   Recent Results (from the past 240 hour(s))  Resp Panel by RT-PCR (Flu A&B, Covid) Anterior Nasal Swab     Status: Abnormal   Collection Time: 03/28/22  2:57 PM   Specimen: Anterior Nasal Swab  Result Value Ref Range Status  SARS Coronavirus 2 by RT PCR POSITIVE (A) NEGATIVE Final    Comment: (NOTE) SARS-CoV-2 target nucleic acids are DETECTED.  The SARS-CoV-2 RNA is generally detectable in upper respiratory specimens during the acute phase of infection. Positive results are indicative of the presence of the identified virus, but do not rule out bacterial infection or co-infection with other pathogens not detected by the test. Clinical correlation with patient history and other diagnostic information is necessary to determine patient infection status. The expected result is Negative.  Fact Sheet for Patients: EntrepreneurPulse.com.au  Fact Sheet for Healthcare Providers: IncredibleEmployment.be  This test is not yet approved or cleared by the [place] FDA and  has been authorized for detection and/or diagnosis of SARS-CoV-2 by FDA under an Emergency Use Authorization (EUA).  This EUA will remain in effect (meaning this test can be used) for the duration of  the COVID-19 declaration under Section 564(b)(1) of the A ct, 21 U.S.C. section 360bbb-3(b)(1), unless the authorization is terminated or revoked  sooner.     Influenza A by PCR NEGATIVE NEGATIVE Final   Influenza B by PCR NEGATIVE NEGATIVE Final    Comment: (NOTE) The Xpert Xpress SARS-CoV-2/FLU/RSV plus assay is intended as an aid in the diagnosis of influenza from Nasopharyngeal swab specimens and should not be used as a sole basis for treatment. Nasal washings and aspirates are unacceptable for Xpert Xpress SARS-CoV-2/FLU/RSV testing.  Fact Sheet for Patients: EntrepreneurPulse.com.au  Fact Sheet for Healthcare Providers: IncredibleEmployment.be  This test is not yet approved or cleared by the [place] FDA and has been authorized for detection and/or diagnosis of SARS-CoV-2 by FDA under an Emergency Use Authorization (EUA). This EUA will remain in effect (meaning this test can be used) for the duration of the COVID-19 declaration under Section 564(b)(1) of the Act, 21 U.S.C. section 360bbb-3(b)(1), unless the authorization is terminated or revoked.  Performed at [facility], [place] [address], [place], [place] [address]   Culture, blood (Routine X 2) w Reflex to ID Panel     Status: None (Preliminary result)   Collection Time: 03/28/22 10:36 PM   Specimen: BLOOD  Result Value Ref Range Status   Specimen Description   Final    BLOOD RIGHT ANTECUBITAL Performed at [facility] [address], [place], [place] [address]    Special Requests   Final    BOTTLES DRAWN AEROBIC ONLY Blood Culture adequate volume Performed at [facility] [address], [place], [place] [address]    Culture   Final    NO GROWTH 4 DAYS Performed at [facility], [place] [address], [place], [place] [address]    Report Status PENDING  Incomplete  Culture, blood (Routine X 2) w Reflex to ID Panel     Status: None (Preliminary result)   Collection Time: 03/28/22 10:41 PM   Specimen: BLOOD  Result Value Ref Range Status   Specimen  Description   Final    BLOOD RIGHT ANTECUBITAL Performed at [place] [facility] [address] [place], [place] [address]    Special Requests   Final    BOTTLES DRAWN AEROBIC ONLY Blood Culture adequate volume Performed at [facility] [address], [place], [place] [address]    Culture   Final    NO GROWTH 4 DAYS Performed at [facility], [place] [address] [place], [place] [address]    Report Status PENDING  Incomplete  MRSA Next Gen  by PCR, Nasal     Status: None   Collection Time: 04/02/22  5:13 AM   Specimen: Nasal Mucosa; Nasal Swab  Result Value Ref Range Status   MRSA by PCR Next Gen NOT DETECTED NOT DETECTED Final    Comment: (NOTE) The GeneXpert MRSA Assay (FDA approved for NASAL specimens only), is one component of a comprehensive MRSA colonization surveillance program. It is not intended to diagnose MRSA infection nor to guide or monitor treatment for MRSA infections. Test performance is not FDA approved in patients less than 81 years old. Performed at [facility], [place] [address], [place], [place] [address]          Radiology Studies: No results found.      Scheduled Meds:  carbamide peroxide  5 drop Both EARS BID   doxycycline  100 mg Oral Q12H   enoxaparin (LOVENOX) injection  30 mg Subcutaneous Q24H   ipratropium-albuterol  3 mL Nebulization TID   isosorbide mononitrate  30 mg Oral Daily   latanoprost  1 drop Both Eyes QHS   levothyroxine  75 mcg Oral QAC breakfast   pantoprazole  40 mg Oral Daily   predniSONE  50 mg Oral Daily   Continuous Infusions:     LOS: 5 days        Hosie Poisson, MD [facility]   To contact the attending provider between 7A-7P or the covering provider during after hours 7P-7A, please log into the web site [email or web address] and access using universal [place] password for that web site. If you do not have the password, please call the hospital  operator.  04/02/2022, 3:56 PM

## 2022-04-03 ENCOUNTER — Telehealth: Payer: Self-pay | Admitting: *Deleted

## 2022-04-03 DIAGNOSIS — U071 COVID-19: Secondary | ICD-10-CM | POA: Diagnosis not present

## 2022-04-03 LAB — CULTURE, BLOOD (ROUTINE X 2)
Culture: NO GROWTH
Culture: NO GROWTH
Special Requests: ADEQUATE
Special Requests: ADEQUATE

## 2022-04-03 LAB — CBC WITH DIFFERENTIAL/PLATELET
Abs Immature Granulocytes: 0.02 10*3/uL (ref 0.00–0.07)
Basophils Absolute: 0 10*3/uL (ref 0.0–0.1)
Basophils Relative: 0 %
Eosinophils Absolute: 0 10*3/uL (ref 0.0–0.5)
Eosinophils Relative: 0 %
HCT: 29.8 % — ABNORMAL LOW (ref 36.0–46.0)
Hemoglobin: 8.8 g/dL — ABNORMAL LOW (ref 12.0–15.0)
Immature Granulocytes: 1 %
Lymphocytes Relative: 14 %
Lymphs Abs: 0.5 10*3/uL — ABNORMAL LOW (ref 0.7–4.0)
MCH: 25.5 pg — ABNORMAL LOW (ref 26.0–34.0)
MCHC: 29.5 g/dL — ABNORMAL LOW (ref 30.0–36.0)
MCV: 86.4 fL (ref 80.0–100.0)
Monocytes Absolute: 0.2 10*3/uL (ref 0.1–1.0)
Monocytes Relative: 5 %
Neutro Abs: 2.6 10*3/uL (ref 1.7–7.7)
Neutrophils Relative %: 80 %
Platelets: 143 10*3/uL — ABNORMAL LOW (ref 150–400)
RBC: 3.45 MIL/uL — ABNORMAL LOW (ref 3.87–5.11)
RDW: 16.2 % — ABNORMAL HIGH (ref 11.5–15.5)
WBC: 3.3 10*3/uL — ABNORMAL LOW (ref 4.0–10.5)
nRBC: 0 % (ref 0.0–0.2)

## 2022-04-03 LAB — BASIC METABOLIC PANEL
Anion gap: 5 (ref 5–15)
BUN: 43 mg/dL — ABNORMAL HIGH (ref 8–23)
CO2: 27 mmol/L (ref 22–32)
Calcium: 8.2 mg/dL — ABNORMAL LOW (ref 8.9–10.3)
Chloride: 106 mmol/L (ref 98–111)
Creatinine, Ser: 1.22 mg/dL — ABNORMAL HIGH (ref 0.44–1.00)
GFR, Estimated: 41 mL/min — ABNORMAL LOW (ref >60)
Glucose, Bld: 135 mg/dL — ABNORMAL HIGH (ref 70–99)
Potassium: 4.4 mmol/L (ref 3.5–5.1)
Sodium: 138 mmol/L (ref 135–145)

## 2022-04-03 MED ORDER — AMLODIPINE BESYLATE 5 MG PO TABS
5.0000 mg | ORAL_TABLET | Freq: Every day | ORAL | Status: DC
  Administered 2022-04-03: 5 mg via ORAL
  Filled 2022-04-03: qty 1

## 2022-04-03 MED ORDER — PREDNISONE 10 MG PO TABS: 10.0000 mg | ORAL_TABLET | Freq: Every day | ORAL | 0 refills | Status: AC

## 2022-04-03 MED ORDER — PREDNISONE 10 MG PO TABS: 10.0000 mg | ORAL_TABLET | Freq: Every day | ORAL | Status: DC

## 2022-04-03 MED ORDER — ORAL CARE MOUTH RINSE: 15.0000 mL | OROMUCOSAL | Status: DC | PRN

## 2022-04-03 MED ORDER — LOPERAMIDE HCL 2 MG PO CAPS: 2.0000 mg | ORAL_CAPSULE | ORAL | 0 refills | Status: AC | PRN

## 2022-04-03 MED ORDER — PREDNISONE 5 MG PO TABS: 5.0000 mg | ORAL_TABLET | Freq: Every day | ORAL | 1 refills | Status: AC

## 2022-04-03 NOTE — TOC Transition Note (Addendum)
Transition of Care [facility]) - CM/SW Discharge Note   Patient Details  Name: [name] MRN: [id number] Date of Birth: 1924-09-22  [facility] CM/SW Contact:  Roseanne Kaufman, RN Phone Number: 04/03/2022, 12:57 PM   Clinical Narrative:   Andee Poles with [facility] will deliver oxygen to patient's room. This RNCM advised patient's daughter will return after 2pm, she has oxygen questions. This RNCM still awaiting call back from [facility] regarding [employer], HHPT/OT, SW if [facility] services are provided at [facility]. Per Danise Mina is not available so she will give message to Zacarias Pontes who is in a meeting however will call this RNCM back.   TOC will continue to follow.  - 2:27p Per Danielle with [employer] portable oxygen has been delivered to patient's room. Patient reports she can not use oxygen with her walker, Danielle left her contact phone number for patient's daughter to give her a call with questions.   TOC will continue to follow.   -2:58pm Spoke with [name] at [employer] to coordinate [facility] services. [name] reports Mickel Baas has received message and will call as soon as possible.   TOC will continue to follow.   -3:35p Faxed FL2 to [facility], received successful transmittal notice, faxed clinicals to [facility] for [facility] services.  Awaiting a call back from [facility].   - 3:44pm spoke with patient's daughter [name] to advise no response from [facility]. PAtient's daughter advise she has called and left multiple message for [facility] withno response either. This RNCM advised  faxed FL2 to [facility], with successful receipt. Patient's daughter states she will take her mother on to [facility] and have them coordinate the home health services later.    - 3:57p Called Keisha with [facility] and voicemail was full, unable to leave a message. Placed another phone call to [facility] spoke with Clyde Canterbury who states Mickel Baas the [employer] is still in a meeting. Clyde Canterbury will  take this note to Mickel Baas to determine what she needs to do.This RNCM advised Clyde Canterbury that FL2 has been faxed to [facility] [phone], [facility] orders faxed to [phone]. If no response will do a welfare check.      Barriers to Discharge: Continued Medical Work up   Patient Goals and CMS Choice Patient states their goals for this hospitalization and ongoing recovery are:: Assisted Living -  [place] ([place])      Discharge Placement    ALF ([place]) with [place], [place], [place], SW                  Discharge Plan and Services   Discharge Planning Services: CM Consult               Social Determinants of Health (SDOH) Interventions     Readmission Risk Interventions    03/31/2022    3:34 PM 12/26/2020    2:05 PM  Readmission Risk Prevention Plan  Transportation Screening Complete Complete  PCP or Specialist Appt within 3-5 Days Complete   HRI or [place] Complete   Social Work Consult for [place] Planning/Counseling Complete   Palliative Care Screening Not Applicable   Medication Review Press photographer) Complete Complete  PCP or Specialist appointment within 3-5 days of discharge  Complete  HRI or [place]  Complete  SW Recovery Care/Counseling Consult  Complete  Palliative Care Screening  Not [place]  Complete

## 2022-04-03 NOTE — Progress Notes (Signed)
SATURATION QUALIFICATIONS: (This note is used to comply with regulatory documentation for home oxygen)  Patient Saturations on Room Air at Rest = 96%  Patient Saturations on Room Air while Ambulating = 86%  Patient Saturations on 2 Liters of oxygen while Ambulating = 89%  Please briefly explain why patient needs home oxygen: Patient desaturates with exertion  Angie Fava, RN

## 2022-04-03 NOTE — TOC Progression Note (Addendum)
Transition of Care [facility]) - Progression Note    Patient Details  Name: [name] MRN: [id number] Date of Birth: [birth year]  Transition of Care [facility]) CM/SW [place], RN Phone Number: 04/03/2022, 11:39 AM  Clinical Narrative:   Patient from [facility], awaiting a call back from [place] Secretary/administrator) at [employer].  PT has recommended HHPT/OT. Patient's daughter [name] reports patient was set up with [facility] services at [facility] however unable to get started due to being COVD+ on 03/27/22 and coming to the hospital.   Spoke with patient's daughter Jose Persia who reports the MD stated she will need oxygen at discharge. This RNCM did not see an home oxygen order, however will continue to follow. Patient's family is agreeable to [facility] providing oxygen services. Notified Danielle with [employer] to follow for home oxygen needs.  TOC will continue to follow.    Expected Discharge Plan: Assisted Living (previously at [facility]) Barriers to Discharge: Continued Medical Work up  Expected Discharge Plan and Services Expected Discharge Plan: Assisted Living ([facility] at [facility])   Discharge Planning Services: [place] arrangements for the past 2 months: [place]                                       Social Determinants of Health (SDOH) Interventions    Readmission Risk Interventions    03/31/2022    3:34 PM 12/26/2020    2:05 PM  Readmission Risk Prevention Plan  Transportation Screening Complete Complete  PCP or Specialist Appt within 3-5 Days Complete   HRI or [place] Complete   Social Work Consult for [place] Planning/Counseling Complete   Palliative Care Screening Not Applicable   Medication Review Press photographer) Complete Complete  PCP or Specialist appointment within 3-5 days of discharge  Complete  HRI or [place]  Complete  SW Recovery Care/Counseling Consult   Complete  Palliative Care Screening  Not [place]  Complete

## 2022-04-03 NOTE — Progress Notes (Signed)
Notified on call provider about patient's BP being 167/67. On call provider told RN just to continue to monitor. Will pass along to dayshift nurse.

## 2022-04-03 NOTE — [place] FL2 (Signed)
[place] MEDICAID FL2 LEVEL OF CARE SCREENING TOOL     IDENTIFICATION  Patient Name: [name] Birthdate: 1925-06-05 Sex: female Admission Date (Current Location): 03/28/2022  [facility] and [place] Number:  Herbalist and Address:  [facility],  [place], [place]      Provider Number: [id number]  Attending Physician Name and Address:  Terrilee Croak, MD  Relative Name and Phone Number:  Jose Persia [phone]    Current Level of Care: Hospital Recommended Level of Care: Assisted Living Facility Prior Approval Number:    Date Approved/Denied:   PASRR Number:    Discharge Plan: ICF (Assisted Living)    Current Diagnoses: Patient Active Problem List   Diagnosis Date Noted   COVID-19 virus infection 03/28/2022   Acute hyponatremia 03/28/2022   Severe sepsis ([place]) 03/28/2022   Aortic stenosis 07/16/2021   Chronic combined systolic and diastolic CHF (congestive heart failure) ([place]) 02/27/2021   Brain aneurysm 02/27/2021   Cervical spinal stenosis 02/27/2021   Cerebral thrombosis with cerebral infarction 12/25/2020   Right hand weakness 12/23/2020   SVT (supraventricular tachycardia) ([place]) 09/01/2020   Chronic combined systolic and diastolic heart failure ([place]) 09/01/2020   Acute respiratory failure with hypoxia (HCC)    Mild aortic stenosis 08/30/2020   Elevated troponin 08/30/2020   Pleural effusion on left 08/30/2020   Pain in joint of left knee 04/10/2020   D-dimer, elevated 01/14/2020   Elevated brain natriuretic peptide (BNP) level 01/14/2020   Current chronic use of systemic steroids 07/13/2019   Medication management 04/02/2018   COPD exacerbation ([place]) 03/31/2018   Hypokalemia 03/30/2018   COPD with acute exacerbation (HCC) 03/02/2018   SOB (shortness of breath) 01/12/2018   HLD (hyperlipidemia) 01/12/2018   Stage 3b chronic kidney disease (CKD) ([place]) 01/12/2018   New onset left bundle branch block (LBBB)  01/12/2018   COPD GOLD I D 01/12/2018   Neurogenic claudication due to lumbar spinal stenosis 09/13/2017   Anemia of chronic disease    AVM (arteriovenous malformation) of duodenum, acquired with hemorrhage    Acute GI bleeding 10/09/2016   COPD with emphysema ([place]) 05/10/2016   Recurrent laryngeal neuropathy 02/01/2016   Nausea with vomiting 12/11/2015   Left carotid stenosis 11/21/2015   Asymptomatic carotid artery stenosis 11/13/2015   Impaired glucose tolerance 10/27/2015   Solitary pulmonary nodule 10/27/2015   Thyroid nodule 10/27/2015   Syncope and collapse 10/04/2015   Bradycardia with less than 60 beats per minute 10/04/2015   History of embolic stroke 29/52/8413   Paresthesia of both feet 07/06/2014   Aortic arch atherosclerosis ([place]) 06/24/2014   Atherosclerotic ulcer of aorta ([place]) 06/24/2014   Stroke ([place]) 06/22/2014   Bilateral carotid artery disease ([place]) 05/18/2014   TOBACCO USE, QUIT 04/12/2009   [place] DISEASE, LUMBAR 12/16/2008   DIVERTICULOSIS, COLON 09/30/2008   Personal history of colonic polyps 09/30/2008   DYSPNEA 07/13/2008   Weakness 11/09/2007   Hypothyroidism 10/13/2007   Mixed hyperlipidemia 03/06/2007   Essential hypertension 03/06/2007   Osteoarthritis 03/06/2007    Orientation RESPIRATION BLADDER Height & Weight     Self, Time, Situation  O2 (2L oxygen [place]) Continent Weight: 59.9 kg Height:  '5\' 6"'$  (167.6 cm)  BEHAVIORAL SYMPTOMS/MOOD NEUROLOGICAL BOWEL NUTRITION STATUS      Continent Diet (regualr, thin fluids)  AMBULATORY STATUS COMMUNICATION OF NEEDS Skin   Limited Assist Verbally Normal  Personal Care Assistance Level of Assistance  Bathing, Feeding, Dressing Bathing Assistance: Limited assistance Feeding assistance: Independent Dressing Assistance: Limited assistance     Functional Limitations Info  Sight, Hearing, Speech Sight Info: Adequate Hearing Info: Impaired (HOH) Speech Info: Adequate     SPECIAL CARE FACTORS FREQUENCY  PT (By licensed PT), OT (By licensed OT)     PT Frequency: 5x per week OT Frequency: 5x per week            Contractures Contractures Info: Not present    Additional Factors Info  Code Status, Allergies Code Status Info: DNR Allergies Info: Catapres (Clonidine Hcl), lisinopril, labetalol           Current Medications (04/03/2022):  This is the current hospital active medication list Current Facility-Administered Medications  Medication Dose Route Frequency Provider Last Rate Last Admin   acetaminophen (TYLENOL) tablet 650 mg  650 mg Oral Q6H PRN Howerter, Justin B, DO       Or   acetaminophen (TYLENOL) suppository 650 mg  650 mg Rectal Q6H PRN Howerter, Justin B, DO       albuterol (PROVENTIL) (2.5 MG/3ML) 0.083% nebulizer solution 2.5 mg  2.5 mg Nebulization Q4H PRN Hosie Poisson, MD   2.5 mg at 03/31/22 1310   ALPRAZolam (XANAX) tablet 0.25 mg  0.25 mg Oral TID PRN Hosie Poisson, MD   0.25 mg at 04/03/22 1502   amLODipine (NORVASC) tablet 5 mg  5 mg Oral Daily Dahal, Binaya, MD   5 mg at 04/03/22 1031   carbamide peroxide (DEBROX) 6.5 % OTIC (EAR) solution 5 drop  5 drop Both EARS BID Hosie Poisson, MD   5 drop at 04/01/22 1057   doxycycline (VIBRA-TABS) tablet 100 mg  100 mg Oral Q12H Hosie Poisson, MD   100 mg at 04/03/22 1032   enoxaparin (LOVENOX) injection 30 mg  30 mg Subcutaneous Q24H Hosie Poisson, MD   30 mg at 04/02/22 1819   ipratropium-albuterol (DUONEB) 0.5-2.5 (3) MG/3ML nebulizer solution 3 mL  3 mL Nebulization TID Hosie Poisson, MD   3 mL at 04/03/22 1311   isosorbide mononitrate (IMDUR) 24 hr tablet 30 mg  30 mg Oral Daily Hosie Poisson, MD   30 mg at 04/03/22 1032   latanoprost (XALATAN) 0.005 % ophthalmic solution 1 drop  1 drop Both Eyes QHS Howerter, Justin B, DO   1 drop at 04/02/22 2104   levothyroxine (SYNTHROID) tablet 75 mcg  75 mcg Oral QAC breakfast Howerter, Justin B, DO   75 mcg at 04/03/22 0602   lip balm  (CARMEX) ointment   Topical PRN Hosie Poisson, MD   1 Application at 40/10/27 2115   loperamide (IMODIUM) capsule 2 mg  2 mg Oral PRN Hosie Poisson, MD   2 mg at 04/01/22 1057   ondansetron (ZOFRAN) injection 4 mg  4 mg Intravenous Q8H PRN Hosie Poisson, MD   4 mg at 04/01/22 1056   Oral care mouth rinse  15 mL Mouth Rinse PRN Hosie Poisson, MD       pantoprazole (PROTONIX) EC tablet 40 mg  40 mg Oral Daily Howerter, Justin B, DO   40 mg at 04/03/22 1031   [START ON 04/04/2022] predniSONE (DELTASONE) tablet 10 mg  10 mg Oral Daily Dahal, Marlowe Aschoff, MD         Discharge Medications: Please see discharge summary for a list of discharge medications.  Relevant Imaging Results:  Relevant Lab Results:   Additional Information SSN: [id number]  Rigoberto Noel  Lysette Lindenbaum, RN

## 2022-04-03 NOTE — [place] FL2 (Signed)
[place] MEDICAID FL2 LEVEL OF CARE SCREENING TOOL     IDENTIFICATION  Patient Name: [name] Birthdate: [birth year] Sex: female Admission Date (Current Location): 03/28/2022  [facility] and [place] Number:  Herbalist and Address:  [facility],  [place], [place]      Provider Number: [id number]  Attending Physician Name and Address:  Terrilee Croak, MD  Relative Name and Phone Number:  Jose Persia [phone]    Current Level of Care: Hospital Recommended Level of Care: Assisted Living Facility Prior Approval Number:    Date Approved/Denied:   PASRR Number:    Discharge Plan: ICF (Assisted Living)    Current Diagnoses: Patient Active Problem List   Diagnosis Date Noted   COVID-19 virus infection 03/28/2022   Acute hyponatremia 03/28/2022   Severe sepsis ([place]) 03/28/2022   Aortic stenosis 07/16/2021   Chronic combined systolic and diastolic CHF (congestive heart failure) ([place]) 02/27/2021   Brain aneurysm 02/27/2021   Cervical spinal stenosis 02/27/2021   Cerebral thrombosis with cerebral infarction 12/25/2020   Right hand weakness 12/23/2020   SVT (supraventricular tachycardia) ([place]) 09/01/2020   Chronic combined systolic and diastolic heart failure ([place]) 09/01/2020   Acute respiratory failure with hypoxia (HCC)    Mild aortic stenosis 08/30/2020   Elevated troponin 08/30/2020   Pleural effusion on left 08/30/2020   Pain in joint of left knee 04/10/2020   D-dimer, elevated 01/14/2020   Elevated brain natriuretic peptide (BNP) level 01/14/2020   Current chronic use of systemic steroids 07/13/2019   Medication management 04/02/2018   COPD exacerbation ([place]) 03/31/2018   Hypokalemia 03/30/2018   COPD with acute exacerbation (HCC) 03/02/2018   SOB (shortness of breath) 01/12/2018   HLD (hyperlipidemia) 01/12/2018   Stage 3b chronic kidney disease (CKD) ([place]) 01/12/2018   New onset left bundle branch block (LBBB)  01/12/2018   COPD GOLD I D 01/12/2018   Neurogenic claudication due to lumbar spinal stenosis 09/13/2017   Anemia of chronic disease    AVM (arteriovenous malformation) of duodenum, acquired with hemorrhage    Acute GI bleeding 10/09/2016   COPD with emphysema ([place]) 05/10/2016   Recurrent laryngeal neuropathy 02/01/2016   Nausea with vomiting 12/11/2015   Left carotid stenosis 11/21/2015   Asymptomatic carotid artery stenosis 11/13/2015   Impaired glucose tolerance 10/27/2015   Solitary pulmonary nodule 10/27/2015   Thyroid nodule 10/27/2015   Syncope and collapse 10/04/2015   Bradycardia with less than 60 beats per minute 10/04/2015   History of embolic stroke 17/40/8144   Paresthesia of both feet 07/06/2014   Aortic arch atherosclerosis ([place]) 06/24/2014   Atherosclerotic ulcer of aorta ([place]) 06/24/2014   Stroke ([place]) 06/22/2014   Bilateral carotid artery disease ([place]) 05/18/2014   TOBACCO USE, QUIT 04/12/2009   [place] DISEASE, LUMBAR 12/16/2008   DIVERTICULOSIS, COLON 09/30/2008   Personal history of colonic polyps 09/30/2008   DYSPNEA 07/13/2008   Weakness 11/09/2007   Hypothyroidism 10/13/2007   Mixed hyperlipidemia 03/06/2007   Essential hypertension 03/06/2007   Osteoarthritis 03/06/2007    Orientation RESPIRATION BLADDER Height & Weight     Self, Time, Situation  O2 (2L oxygen [place]) Continent Weight: 59.9 kg Height:  '5\' 6"'$  (167.6 cm)  BEHAVIORAL SYMPTOMS/MOOD NEUROLOGICAL BOWEL NUTRITION STATUS      Continent Diet (regualr, thin fluids)  AMBULATORY STATUS COMMUNICATION OF NEEDS Skin   Limited Assist Verbally Normal  Personal Care Assistance Level of Assistance  Bathing, Feeding, Dressing Bathing Assistance: Limited assistance Feeding assistance: Independent Dressing Assistance: Limited assistance     Functional Limitations Info  Sight, Hearing, Speech Sight Info: Adequate Hearing Info: Impaired (HOH) Speech Info: Adequate     SPECIAL CARE FACTORS FREQUENCY  PT (By licensed PT), OT (By licensed OT)     PT Frequency: 5x per week OT Frequency: 5x per week            Contractures Contractures Info: Not present    Additional Factors Info  Code Status, Allergies Code Status Info: DNR Allergies Info: Catapres (Clonidine Hcl), lisinopril, labetalol             Discharge Medications  TAKE these medications     albuterol 108 (90 Base) MCG/ACT inhaler Commonly known as: ProAir HFA Inhale 2 puffs into the lungs every 6 (six) hours as needed for wheezing or shortness of breath.    ALPRAZolam 0.5 MG tablet Commonly known as: XANAX TAKE 1 TABLET BY MOUTH 3 TIMES DAILY. What changed:  how much to take when to take this reasons to take this    amLODipine 5 MG tablet Commonly known as: NORVASC TAKE 1 TABLET (5 MG TOTAL) BY MOUTH DAILY.    aspirin EC 81 MG tablet Take 1 tablet (81 mg total) by mouth daily. Swallow whole. What changed: additional instructions    atorvastatin 80 MG tablet Commonly known as: LIPITOR TAKE 1 TABLET BY MOUTH EVERY DAY AT 6PM What changed:  how much to take how to take this when to take this additional instructions    furosemide 20 MG tablet Commonly known as: LASIX TAKE 1 TABLET BY MOUTH EVERY DAY    hydrALAZINE 50 MG tablet Commonly known as: APRESOLINE TAKE 1 TABLET BY MOUTH THREE TIMES A DAY    ipratropium-albuterol 0.5-2.5 (3) MG/3ML Soln Commonly known as: DUONEB Take 3 mLs by nebulization every 6 (six) hours as needed (copd).    isosorbide mononitrate 30 MG 24 hr tablet Commonly known as: IMDUR TAKE 1 TABLET BY MOUTH EVERY DAY    Klor-Con M20 20 MEQ tablet Generic drug: potassium chloride SA TAKE 1 TABLET BY MOUTH EVERY DAY What changed: how much to take    latanoprost 0.005 % ophthalmic solution Commonly known as: XALATAN Place 1 drop into both eyes at bedtime.    levothyroxine 75 MCG tablet Commonly known as: SYNTHROID TAKE 1 TABLET  BY MOUTH DAILY BEFORE BREAKFAST.    loperamide 2 MG capsule Commonly known as: IMODIUM Take 1 capsule (2 mg total) by mouth as needed for diarrhea or loose stools.    pantoprazole 40 MG tablet Commonly known as: PROTONIX Take 1 tablet (40 mg total) by mouth daily. SCHEDULE APPT FOR FUTURE REFILLS    predniSONE 10 MG tablet Commonly known as: DELTASONE Take 1 tablet (10 mg total) by mouth daily for 5 days. Start taking on: April 04, 2022 What changed: You were already taking a medication with the same name, and this prescription was added. Make sure you understand how and when to take each.    predniSONE 5 MG tablet Commonly known as: DELTASONE Take 1 tablet (5 mg total) by mouth daily with breakfast. Start taking on: April 09, 2022 What changed: These instructions start on April 09, 2022. If you are unsure what to do until then, ask your doctor or other care provider.    Trelegy Ellipta 100-62.5-25 MCG/ACT Aepb Generic drug: Fluticasone-Umeclidin-Vilant Inhale 1 puff into the  lungs daily.           Relevant Imaging Results:  Relevant Lab Results:   Additional Information SSN: [id number]  Joaquin Courts, RN

## 2022-04-03 NOTE — Discharge Summary (Signed)
Physician Discharge Summary  [name] TFT:[id number] DOB: 04/20/1925 DOA: 03/28/2022  PCP: [name], NP  Admit date: 03/28/2022 Discharge date: 04/03/2022  Admitted From: Assisted living facility Discharge disposition: Back to ALF  Recommendations at discharge:  In the hospital, you had been receiving higher dose of prednisone.  On your request, prednisone dose has been reduced to 10 mg daily.  Stay on 10 mg daily for 5 days after which you can go back to using 5 mg daily as before.  If your shortness of breath and wheezing gets worse, may need to up the dose of prednisone again.   Brief narrative: [name] is a 88 y.o. female with PMH significant for severe COPD on chronic steroids, chronic systolic/diastolic CHF, moderate aortic stenosis, CKD 3B, HTN, HLD, coronary artery disease, stroke, hypothyroidism. On 9/21, patient was brought to the ED from [facility] assisted living for worsening shortness of breath, wheezing, nonproductive cough, chest congestion, chest discomfort and diagnosis of COVID 1 day prior on 9/20.  In the ED, she required 2 L oxygen by nasal cannula to maintain saturation more than 90%. Work-up in the ED with chest x-ray did not show any acute cardiopulmonary process but showed background bronchitic changes from COPD. Admitted to [facility] Palliative care consult was obtained See below for details  Subjective: Patient was seen and examined this morning.  Elderly Caucasian female.  Sitting up in chair.  Not in distress.  On 2 L oxygen by nasal cannula at rest.  No wheezing.  Daughter at bedside. Chart reviewed In last 24 hours, remains afebrile, blood pressure elevated to 160s this morning, currently on 3 L oxygen by nasal cannula Last set of labs from this morning with BUN/creatinine 43/1.22, WBC count 3.3, hemoglobin down to 8.8  Hospital course: COVID pneumonia Acute respiratory failure with hypoxia  Acute exacerbation of COPD Presented on 9/21 with  COVID-positive on 9/20 and worsening chest symptoms Chest x-ray on admission without infiltrates Treatment: Patient completed a course of IV remdesivir and doxycycline.  Patient chronically on prednisone 5 mg daily.  Initially she was started on IV steroids and currently on a tapering course of prednisone.  Today, patient states that she is having leg pain which happened in the past with high-dose of prednisone.  She wants to switch to lower dose of prednisone.  I have reduced it to 10 mg daily.  Patient and her daughter have been instructed to watch her symptoms and go up on prednisone if needed. Continue as needed antitussives, bronchodilators, Mucinex Remains on 3 L oxygen by nasal cannula.  Ambulatory oxygen requirement checked.  To discharge on home oxygen. WBC and inflammatory markers trend as below. Recent Labs  Lab 03/28/22 1457 03/28/22 1501 03/28/22 1501 03/28/22 2236 03/29/22 0119 03/30/22 0353 03/31/22 0351 04/01/22 0411 04/03/22 0415  SARSCOV2NAA POSITIVE*  --   --   --   --   --   --   --   --   WBC  --  2.9*   < >  --  1.4* 4.7 5.5 4.6 3.3*  LATICACIDVEN  --   --   --  2.3* 2.1*  --   --   --   --   PROCALCITON  --  <0.10  --   --   --   --   --   --   --   FERRITIN  --   --   --  23  --   --   --   --   --  CRP  --   --   --  2.8* 2.7* 1.9* 1.4* 1.6*  --   ALT  --  26  --   --  30  --   --   --   --    < > = values in this interval not displayed.   Nausea and intermittent diarrhea Probably from the viral infection.  Improved with supportive care. She reported difficulty swallowing, unable to eat her lunch. SLP eval was obtained.  Noted to have mild aspiration risk.  Regular, thin liquid recommended  Chronic combined systolic and diastolic heart failure BNP was elevated on admission. PTA on Lasix 20 mg daily, hydralazine 50 mg 3 times daily, amlodipine 5 mg daily, Imdur 30 mg daily, 1 dose of Lasix was given which helped breathing status.  Others were resumed  gradually. Continue all post discharge. 9/24, ECHO showed LVEF of 40-45%, global hypokinesis, GIDD  Severe aortic valve stenosis Per echo on 9/24 severe aortic valve stenosis Follows up with cardiologist Dr. Oval Linsey as an outpatient.  Last seen in January 2023.  Per her note, patient was not having any symptoms from aortic stenosis.  In the past, she was also offered TAVR which patient did not want to consider.  Not symptomatic at this time.  Continue to monitor with cardiology as an outpatient.  CAD/HLD Continue aspirin 81 mg daily, Lipitor 80 mg daily  CKD 3B Creatinine remains at baseline Recent Labs    02/07/22 1339 03/28/22 1501 03/29/22 0119 03/30/22 0353 03/31/22 0351 04/01/22 0411 04/03/22 0415  BUN 24* 24* 26* 38* 42* 39* 43*  CREATININE 1.21* 1.18* 1.22* 1.14* 1.32* 1.09* 1.22*    Anemia of chronic disease/iron deficiency anemia Hemoglobin at baseline close to 9. Recent Labs    03/28/22 2236 03/29/22 0119 03/30/22 0353 03/31/22 0351 04/01/22 0411 04/03/22 0415  HGB  --  9.1* 8.6* 9.0* 9.9* 8.8*  MCV  --  86.9 87.7 87.9 86.3 86.4  FERRITIN 23  --   --   --   --   --   TIBC 329  --   --   --   --   --   IRON 17*  --   --   --   --   --    Hypothyroidism Continue with Synthroid at 75 mcg daily.  Wounds:  -    Discharge Exam:   Vitals:   04/03/22 0500 04/03/22 0618 04/03/22 0624 04/03/22 0908  BP:  (!) 174/74 (!) 167/67   Pulse:  60    Resp:  19    Temp:  97.6 F (36.4 C)    TempSrc:  Oral    SpO2:  96%  96%  Weight: 59.9 kg     Height:        Body mass index is 21.31 kg/m.  General exam: Pleasant, elderly African-American female.  Sitting up in chair.  Not in physical distress. Skin: No rashes, lesions or ulcers. HEENT: Atraumatic, normocephalic, no obvious bleeding Lungs: Diminished air entry in both bases.  No wheezing.  Clear to auscultation bilaterally CVS: Regular rate and rhythm, systolic ejection murmur GI/Abd soft, nontender,  nondistended, bowel sound present CNS: Alert, awake, oriented x3 Psychiatry: Mood appropriate Extremities: No pedal edema, no calf tenderness  Follow ups:    Follow-up Information     Nafziger, Tommi Rumps, NP Follow up.   Specialty: Family Medicine Contact information: [address] [place] [address] [phone]  Skeet Latch, MD .   Specialty: Cardiology Contact information: [address] [place] [address] [place] [address] [phone]                 Discharge Instructions:   Discharge Instructions     Call MD for:  difficulty breathing, headache or visual disturbances   Complete by: As directed    Call MD for:  extreme fatigue   Complete by: As directed    Call MD for:  hives   Complete by: As directed    Call MD for:  persistant dizziness or light-headedness   Complete by: As directed    Call MD for:  persistant nausea and vomiting   Complete by: As directed    Call MD for:  severe uncontrolled pain   Complete by: As directed    Call MD for:  temperature >100.4   Complete by: As directed    Diet - low sodium heart healthy   Complete by: As directed    Discharge instructions   Complete by: As directed    Recommendations at discharge:   In the hospital, you had been receiving higher dose of prednisone.  On your request, prednisone dose has been reduced to 10 mg daily.  Stay on 10 mg daily for 5 days after which you can go back to using 5 mg daily as before.  If your shortness of breath and wheezing gets worse, may need to up the dose of prednisone again.]  General discharge instructions: Follow with Primary MD [name], NP in 7 days  Please request your PCP  to go over your hospital tests, procedures, radiology results at the follow up. Please get your medicines reviewed and adjusted.  Your PCP may decide to repeat certain labs or tests as needed. Do not drive, operate heavy machinery, perform activities at heights, swimming  or participation in water activities or provide baby sitting services if your were admitted for syncope or siezures until you have seen by Primary MD or a Neurologist and advised to do so again. [place] Controlled Substance Reporting System database was reviewed. Do not drive, operate heavy machinery, perform activities at heights, swim, participate in water activities or provide baby-sitting services while on medications for pain, sleep and mood until your outpatient physician has reevaluated you and advised to do so again.  You are strongly recommended to comply with the dose, frequency and duration of prescribed medications. Activity: As tolerated with Full fall precautions use walker/cane & assistance as needed Avoid using any recreational substances like cigarette, tobacco, alcohol, or non-prescribed drug. If you experience worsening of your admission symptoms, develop shortness of breath, life threatening emergency, suicidal or homicidal thoughts you must seek medical attention immediately by calling 911 or calling your MD immediately  if symptoms less severe. You must read complete instructions/literature along with all the possible adverse reactions/side effects for all the medicines you take and that have been prescribed to you. Take any new medicine only after you have completely understood and accepted all the possible adverse reactions/side effects.  Wear Seat belts while driving. You were cared for by a hospitalist during your hospital stay. If you have any questions about your discharge medications or the care you received while you were in the hospital after you are discharged, you can call the unit and ask to speak with the hospitalist or the covering physician. Once you are discharged, your primary care physician will handle any further medical issues. Please note that NO  REFILLS for any discharge medications will be authorized once you are discharged, as it is imperative that you  return to your primary care physician (or establish a relationship with a primary care physician if you do not have one).   Increase activity slowly   Complete by: As directed        Discharge Medications:   Allergies as of 04/03/2022       Reactions   Catapres [clonidine Hcl] Other (See Comments)   Made pt feel horrible, shaky, weak and nausea. Not listed on MAR   Clonidine Other (See Comments)   Made pt feel horrible, shaky, weak and nausea. Not listed on MAR   Lisinopril Anaphylaxis   Tongue swelling. Not listed on MAR   Labetalol Other (See Comments)   Caused bradycardia and syncope. Not listed on MAR   Labetalol Hcl Other (See Comments)   Caused bradycardia and syncope. Not listed on [facility]        Medication List     STOP taking these medications    B-12 PO   guaiFENesin 600 MG 12 hr tablet Commonly known as: MUCINEX   molnupiravir EUA 200 MG Caps capsule Commonly known as: LAGEVRIO       TAKE these medications    albuterol 108 (90 Base) MCG/ACT inhaler Commonly known as: ProAir HFA Inhale 2 puffs into the lungs every 6 (six) hours as needed for wheezing or shortness of breath.   ALPRAZolam 0.5 MG tablet Commonly known as: XANAX TAKE 1 TABLET BY MOUTH 3 TIMES DAILY. What changed:  how much to take when to take this reasons to take this   amLODipine 5 MG tablet Commonly known as: NORVASC TAKE 1 TABLET (5 MG TOTAL) BY MOUTH DAILY.   aspirin EC 81 MG tablet Take 1 tablet (81 mg total) by mouth daily. Swallow whole. What changed: additional instructions   atorvastatin 80 MG tablet Commonly known as: LIPITOR TAKE 1 TABLET BY MOUTH EVERY DAY AT 6PM What changed:  how much to take how to take this when to take this additional instructions   furosemide 20 MG tablet Commonly known as: LASIX TAKE 1 TABLET BY MOUTH EVERY DAY   hydrALAZINE 50 MG tablet Commonly known as: APRESOLINE TAKE 1 TABLET BY MOUTH THREE TIMES A DAY   ipratropium-albuterol  0.5-2.5 (3) MG/3ML Soln Commonly known as: DUONEB Take 3 mLs by nebulization every 6 (six) hours as needed (copd).   isosorbide mononitrate 30 MG 24 hr tablet Commonly known as: IMDUR TAKE 1 TABLET BY MOUTH EVERY DAY   Klor-Con M20 20 MEQ tablet Generic drug: potassium chloride SA TAKE 1 TABLET BY MOUTH EVERY DAY What changed: how much to take   latanoprost 0.005 % ophthalmic solution Commonly known as: XALATAN Place 1 drop into both eyes at bedtime.   levothyroxine 75 MCG tablet Commonly known as: SYNTHROID TAKE 1 TABLET BY MOUTH DAILY BEFORE BREAKFAST.   loperamide 2 MG capsule Commonly known as: IMODIUM Take 1 capsule (2 mg total) by mouth as needed for diarrhea or loose stools.   pantoprazole 40 MG tablet Commonly known as: PROTONIX Take 1 tablet (40 mg total) by mouth daily. SCHEDULE APPT FOR FUTURE REFILLS   predniSONE 10 MG tablet Commonly known as: DELTASONE Take 1 tablet (10 mg total) by mouth daily for 5 days. Start taking on: April 04, 2022 What changed: You were already taking a medication with the same name, and this prescription was added. Make sure you understand how and when  to take each.   predniSONE 5 MG tablet Commonly known as: DELTASONE Take 1 tablet (5 mg total) by mouth daily with breakfast. Start taking on: April 09, 2022 What changed: These instructions start on April 09, 2022. If you are unsure what to do until then, ask your doctor or other care provider.   Trelegy Ellipta 100-62.5-25 MCG/ACT Aepb Generic drug: Fluticasone-Umeclidin-Vilant Inhale 1 puff into the lungs daily.               Durable Medical Equipment  (From admission, onward)           Start     Ordered   04/03/22 1253  For home use only DME oxygen  Once       Question Answer Comment  Length of Need Lifetime   Mode or (Route) Nasal cannula   Liters per Minute 3   Frequency Continuous (stationary and portable oxygen unit needed)   Oxygen conserving device  Yes   Oxygen delivery system Gas      04/03/22 1252             The results of significant diagnostics from this hospitalization (including imaging, microbiology, ancillary and laboratory) are listed below for reference.    Procedures and Diagnostic Studies:   DG Chest Port 1 View  Result Date: 03/28/2022 CLINICAL DATA:  Short of breath, weakness, possible COVID EXAM: PORTABLE CHEST 1 VIEW COMPARISON:  Prior chest x-ray 02/27/2021 FINDINGS: Stable mild cardiomegaly. Atherosclerotic calcifications again noted throughout the thoracic aorta. Background bronchitic changes are stable. Left lower lobe patchy airspace opacity also appears similar compared to prior. Linear atelectasis versus scarring in the right lung base. No new airspace opacity, pulmonary edema or pneumothorax. No acute osseous abnormality. Degenerative changes at both glenohumeral joints. IMPRESSION: Stable chest x-ray without evidence of acute cardiopulmonary process. Electronically Signed   By: Jacqulynn Cadet M.D.   On: 03/28/2022 15:33     Labs:   Basic Metabolic Panel: Recent Labs  Lab 03/28/22 1501 03/29/22 0119 03/30/22 0353 03/31/22 0351 04/01/22 0411 04/03/22 0415  NA 131* 137 139 140 142 138  K 3.7 3.5 3.6 3.3* 4.1 4.4  CL 98 104 108 106 108 106  CO2 '25 24 25 28 28 27  '$ GLUCOSE 195* 189* 112* 104* 116* 135*  BUN 24* 26* 38* 42* 39* 43*  CREATININE 1.18* 1.22* 1.14* 1.32* 1.09* 1.22*  CALCIUM 7.9* 8.0* 8.3* 8.0* 8.4* 8.2*  MG 2.0 2.6* 2.5* 2.2 2.2  --   PHOS  --  3.9 3.9 3.8 3.6  --    GFR Estimated Creatinine Clearance: 26.4 mL/min (A) (by C-G formula based on SCr of 1.22 mg/dL (H)). Liver Function Tests: Recent Labs  Lab 03/28/22 1501 03/29/22 0119  AST 34 34  ALT 26 30  ALKPHOS 76 76  BILITOT 0.7 0.9  PROT 6.0* 6.0*  ALBUMIN 3.2* 3.1*   No results for input(s): "LIPASE", "AMYLASE" in the last 168 hours. No results for input(s): "AMMONIA" in the last 168 hours. Coagulation  profile Recent Labs  Lab 03/28/22 1501  INR 1.0    CBC: Recent Labs  Lab 03/29/22 0119 03/30/22 0353 03/31/22 0351 04/01/22 0411 04/03/22 0415  WBC 1.4* 4.7 5.5 4.6 3.3*  NEUTROABS 1.1* 3.3 3.8 3.3 2.6  HGB 9.1* 8.6* 9.0* 9.9* 8.8*  HCT 30.6* 28.6* 30.5* 32.8* 29.8*  MCV 86.9 87.7 87.9 86.3 86.4  PLT 150 152 145* 165 143*   Cardiac Enzymes: No results for input(s): "CKTOTAL", "CKMB", "CKMBINDEX", "  TROPONINI" in the last 168 hours. BNP: Invalid input(s): "POCBNP" CBG: No results for input(s): "GLUCAP" in the last 168 hours. D-Dimer No results for input(s): "DDIMER" in the last 72 hours. Hgb A1c No results for input(s): "HGBA1C" in the last 72 hours. Lipid Profile No results for input(s): "CHOL", "HDL", "LDLCALC", "TRIG", "CHOLHDL", "LDLDIRECT" in the last 72 hours. Thyroid function studies No results for input(s): "TSH", "T4TOTAL", "T3FREE", "THYROIDAB" in the last 72 hours.  Invalid input(s): "FREET3" Anemia work up No results for input(s): "VITAMINB12", "FOLATE", "FERRITIN", "TIBC", "IRON", "RETICCTPCT" in the last 72 hours. Microbiology Recent Results (from the past 240 hour(s))  Resp Panel by RT-PCR (Flu A&B, Covid) Anterior Nasal Swab     Status: Abnormal   Collection Time: 03/28/22  2:57 PM   Specimen: Anterior Nasal Swab  Result Value Ref Range Status   SARS Coronavirus 2 by RT PCR POSITIVE (A) NEGATIVE Final    Comment: (NOTE) SARS-CoV-2 target nucleic acids are DETECTED.  The SARS-CoV-2 RNA is generally detectable in upper respiratory specimens during the acute phase of infection. Positive results are indicative of the presence of the identified virus, but do not rule out bacterial infection or co-infection with other pathogens not detected by the test. Clinical correlation with patient history and other diagnostic information is necessary to determine patient infection status. The expected result is Negative.  Fact Sheet for  Patients: EntrepreneurPulse.com.au  Fact Sheet for Healthcare Providers: IncredibleEmployment.be  This test is not yet approved or cleared by the [place] FDA and  has been authorized for detection and/or diagnosis of SARS-CoV-2 by FDA under an Emergency Use Authorization (EUA).  This EUA will remain in effect (meaning this test can be used) for the duration of  the COVID-19 declaration under Section 564(b)(1) of the A ct, 21 U.S.C. section 360bbb-3(b)(1), unless the authorization is terminated or revoked sooner.     Influenza A by PCR NEGATIVE NEGATIVE Final   Influenza B by PCR NEGATIVE NEGATIVE Final    Comment: (NOTE) The Xpert Xpress SARS-CoV-2/FLU/RSV plus assay is intended as an aid in the diagnosis of influenza from Nasopharyngeal swab specimens and should not be used as a sole basis for treatment. Nasal washings and aspirates are unacceptable for Xpert Xpress SARS-CoV-2/FLU/RSV testing.  Fact Sheet for Patients: EntrepreneurPulse.com.au  Fact Sheet for Healthcare Providers: IncredibleEmployment.be  This test is not yet approved or cleared by the [place] FDA and has been authorized for detection and/or diagnosis of SARS-CoV-2 by FDA under an Emergency Use Authorization (EUA). This EUA will remain in effect (meaning this test can be used) for the duration of the COVID-19 declaration under Section 564(b)(1) of the Act, 21 U.S.C. section 360bbb-3(b)(1), unless the authorization is terminated or revoked.  Performed at [facility], [place] [address], [place], [place] [address]   Culture, blood (Routine X 2) w Reflex to ID Panel     Status: None   Collection Time: 03/28/22 10:36 PM   Specimen: BLOOD  Result Value Ref Range Status   Specimen Description   Final    BLOOD RIGHT ANTECUBITAL Performed at [place] [address], [place], [place]  [address]    Special Requests   Final    BOTTLES DRAWN AEROBIC ONLY Blood Culture adequate volume Performed at [place] [address] [place], [place] [address]    Culture   Final    NO GROWTH 5 DAYS Performed at [facility], [place] [address],  [place], [place] [address]    Report Status 04/03/2022 FINAL  Final  Culture, blood (Routine X 2) w Reflex to ID Panel     Status: None   Collection Time: 03/28/22 10:41 PM   Specimen: BLOOD  Result Value Ref Range Status   Specimen Description   Final    BLOOD RIGHT ANTECUBITAL Performed at [place] [address], [place], [place] [address]    Special Requests   Final    BOTTLES DRAWN AEROBIC ONLY Blood Culture adequate volume Performed at [place] [address] [place], [place] [address]    Culture   Final    NO GROWTH 5 DAYS Performed at [facility], [place] [address] [place], [place] [address]    Report Status 04/03/2022 FINAL  Final  MRSA Next Gen by PCR, Nasal     Status: None   Collection Time: 04/02/22  5:13 AM   Specimen: Nasal Mucosa; Nasal Swab  Result Value Ref Range Status   MRSA by PCR Next Gen NOT DETECTED NOT DETECTED Final    Comment: (NOTE) The GeneXpert MRSA Assay (FDA approved for NASAL specimens only), is one component of a comprehensive MRSA colonization surveillance program. It is not intended to diagnose MRSA infection nor to guide or monitor treatment for MRSA infections. Test performance is not FDA approved in patients less than 89 years old. Performed at [facility], [place] [address], [place],  [address]     Time coordinating discharge: 35 minutes  Signed: Marlowe Aschoff Tyion Boylen  [facility] 04/03/2022, 1:00 PM

## 2022-04-03 NOTE — TOC Transition Note (Addendum)
Transition of Care [facility]) - CM/SW Discharge Note   Patient Details  Name: [name] MRN: [id number] Date of Birth: 12-21-1924  Transition of Care [facility]) CM/SW Contact:  Roseanne Kaufman, RN Phone Number: 04/03/2022, 4:58 PM   Clinical Narrative:   Refaxed updated FL2 to [facility]  - 5:01p Received successful confirmation     Barriers to Discharge: Continued Medical Work up   Patient Goals and CMS Choice Patient states their goals for this hospitalization and ongoing recovery are:: Assisted Living -  [facility] ([place])      Discharge Placement                       Discharge Plan and Services   Discharge Planning Services: CM Consult                                 Social Determinants of Health (SDOH) Interventions     Readmission Risk Interventions    03/31/2022    3:34 PM 12/26/2020    2:05 PM  Readmission Risk Prevention Plan  Transportation Screening Complete Complete  PCP or Specialist Appt within 3-5 Days Complete   HRI or [place] Complete   Social Work Consult for [place] Planning/Counseling Complete   Palliative Care Screening Not Applicable   Medication Review [employer]) Complete Complete  PCP or Specialist appointment within 3-5 days of discharge  Complete  HRI or [place]  Complete  SW Recovery Care/Counseling Consult  Complete  Palliative Care Screening  Not [place]  Complete

## 2022-04-03 NOTE — Progress Notes (Signed)
Discharge instructions provided to and reviewed with patient and patient's daughter [name].  Both verbalized understanding.  PIV removed.  Patient transported to main entrance via wheelchair with belongings and home-use oxygen.  Patient to be transport back to ALF with daughter.  Angie Fava, RN

## 2022-04-03 NOTE — Plan of Care (Signed)
  Problem: Education: Goal: Knowledge of General Education information will improve Description: Including pain rating scale, medication(s)/side effects and non-pharmacologic comfort measures Outcome: Progressing   Problem: Activity: Goal: Risk for activity intolerance will decrease Outcome: Progressing   Problem: Coping: Goal: Level of anxiety will decrease Outcome: Progressing   Problem: Pain Managment: Goal: General experience of comfort will improve Outcome: Progressing   Problem: Safety: Goal: Ability to remain free from injury will improve Outcome: Progressing   Problem: Skin Integrity: Goal: Risk for impaired skin integrity will decrease Outcome: Progressing   Problem: Respiratory: Goal: Will maintain a patent airway Outcome: Progressing

## 2022-04-03 NOTE — Plan of Care (Signed)

## 2022-04-04 ENCOUNTER — Telehealth: Payer: Self-pay

## 2022-04-04 NOTE — Telephone Encounter (Signed)
Patient's daughter called and is very concerned. Informed daughter that message was sent to Dr. Vaughan Browner as high priority. Patient's daughter states we need to reach out to [place] at [phone] with doctors recommendations.

## 2022-04-04 NOTE — Telephone Encounter (Addendum)
Per daughter [name]  Transition Care Management Follow-up Telephone Call Date of discharge and from where: 04/03/2022 [facility] How have you been since you were released from the hospital? On oxygen, but does not like it, doing ok Any questions or concerns? No  Items Reviewed: Did the pt receive and understand the discharge instructions provided? Yes  Medications obtained and verified? Yes  Other? No  Any new allergies since your discharge? No  Dietary orders reviewed? Yes Do you have support at home? Yes   Home Care and Equipment/Supplies: Were home health services ordered? yes If so, what is the name of the agency?   Has the agency set up a time to come to the patient's home? no Were any new equipment or medical supplies ordered?  Yes: oxygen What is the name of the medical supply agency?  Were you able to get the supplies/equipment? no Do you have any questions related to the use of the equipment or supplies? No  Functional Questionnaire: (I = Independent and D = Dependent) ADLs: I  Bathing/Dressing- I  Meal Prep- D  Eating- I  Maintaining continence- I  Transferring/Ambulation- I  Managing Meds- D  Follow up appointments reviewed:  PCP Hospital f/u appt confirmed? No  hung up before we could schedule an appointment Are transportation arrangements needed? No  If their condition worsens, is the pt aware to call PCP or go to the Emergency Dept.? Yes Was the patient provided with contact information for the PCP's office or ED? Yes Was to pt encouraged to call back with questions or concerns? Yes

## 2022-04-04 NOTE — Telephone Encounter (Signed)
Called and spoke with patient's daughter. She stated that her mother was recently diagnosed from the hospital on 3L o O2. Her DME is [employer]. She currently only has the oxygen tanks and these are too heavy for her to push and pull around the facility. She would like for her to have a POC.   While on the phone, she also wanted to discuss her prednisone. The facility she is at currently [facility] [place]) had her on '50mg'$  of prednisone and plan to drop her back down to '10mg'$ . [name] was not able to find any documentation of this on the discharge summary. I went over the summary with her and did not see this either. The only documentation I could find was for her to be on '10mg'$  of prednisone for 5 days then resume '5mg'$  once daily.   She would like to have an order sent to [facility] to have the prednisone changed.   Dr. Vaughan Browner, can you please advise? Thanks!

## 2022-04-04 NOTE — Telephone Encounter (Signed)
Dr. Mannam please advise. °

## 2022-04-05 NOTE — Telephone Encounter (Signed)
I called and spoke with the daughter [name]. [name] is currently at a prednisone dose of 10 mg for a few weeks and then will taper to 5 mg/day.  I told her that I agree with this regimen for now and to continue the same and monitor symptoms  Continue oxygen at 3 L.  Please make follow-up visit in 2 to 4 weeks with me or APP for checkup and evaluate for POC

## 2022-04-05 NOTE — Telephone Encounter (Signed)
Called and spoke with patient's daughter [name]. I attempted to get the patient scheduled for an OV and walk test for the POC. She stated that PT at the facility have been doing the same walk tests each day. She will ask for the PT to fax our office a copy of the walk results. Will keep encounter open for follow up.

## 2022-04-06 ENCOUNTER — Inpatient Hospital Stay (HOSPITAL_COMMUNITY)
Admission: EM | Admit: 2022-04-06 | Discharge: 2022-05-08 | DRG: 951 | Disposition: E | Payer: Medicare PPO | Source: Skilled Nursing Facility | Attending: Internal Medicine | Admitting: Internal Medicine

## 2022-04-06 ENCOUNTER — Encounter (HOSPITAL_COMMUNITY): Payer: Self-pay

## 2022-04-06 ENCOUNTER — Other Ambulatory Visit: Payer: Self-pay

## 2022-04-06 ENCOUNTER — Emergency Department (HOSPITAL_COMMUNITY): Payer: Medicare PPO

## 2022-04-06 DIAGNOSIS — E039 Hypothyroidism, unspecified: Secondary | ICD-10-CM | POA: Diagnosis present

## 2022-04-06 DIAGNOSIS — I5042 Chronic combined systolic (congestive) and diastolic (congestive) heart failure: Secondary | ICD-10-CM | POA: Diagnosis present

## 2022-04-06 DIAGNOSIS — R0602 Shortness of breath: Secondary | ICD-10-CM | POA: Diagnosis not present

## 2022-04-06 DIAGNOSIS — Z9981 Dependence on supplemental oxygen: Secondary | ICD-10-CM | POA: Diagnosis not present

## 2022-04-06 DIAGNOSIS — I10 Essential (primary) hypertension: Secondary | ICD-10-CM | POA: Diagnosis not present

## 2022-04-06 DIAGNOSIS — J439 Emphysema, unspecified: Secondary | ICD-10-CM | POA: Diagnosis present

## 2022-04-06 DIAGNOSIS — R54 Age-related physical debility: Secondary | ICD-10-CM | POA: Diagnosis present

## 2022-04-06 DIAGNOSIS — Z86010 Personal history of colonic polyps: Secondary | ICD-10-CM | POA: Diagnosis not present

## 2022-04-06 DIAGNOSIS — A419 Sepsis, unspecified organism: Secondary | ICD-10-CM | POA: Diagnosis present

## 2022-04-06 DIAGNOSIS — I130 Hypertensive heart and chronic kidney disease with heart failure and stage 1 through stage 4 chronic kidney disease, or unspecified chronic kidney disease: Secondary | ICD-10-CM | POA: Diagnosis not present

## 2022-04-06 DIAGNOSIS — Z8673 Personal history of transient ischemic attack (TIA), and cerebral infarction without residual deficits: Secondary | ICD-10-CM

## 2022-04-06 DIAGNOSIS — Z515 Encounter for palliative care: Secondary | ICD-10-CM | POA: Diagnosis not present

## 2022-04-06 DIAGNOSIS — U099 Post covid-19 condition, unspecified: Secondary | ICD-10-CM | POA: Diagnosis present

## 2022-04-06 DIAGNOSIS — A4189 Other specified sepsis: Secondary | ICD-10-CM | POA: Diagnosis not present

## 2022-04-06 DIAGNOSIS — F419 Anxiety disorder, unspecified: Secondary | ICD-10-CM | POA: Diagnosis present

## 2022-04-06 DIAGNOSIS — J1282 Pneumonia due to coronavirus disease 2019: Secondary | ICD-10-CM | POA: Diagnosis not present

## 2022-04-06 DIAGNOSIS — E785 Hyperlipidemia, unspecified: Secondary | ICD-10-CM | POA: Diagnosis present

## 2022-04-06 DIAGNOSIS — R569 Unspecified convulsions: Secondary | ICD-10-CM | POA: Diagnosis present

## 2022-04-06 DIAGNOSIS — Z66 Do not resuscitate: Secondary | ICD-10-CM | POA: Diagnosis not present

## 2022-04-06 DIAGNOSIS — Z7189 Other specified counseling: Secondary | ICD-10-CM

## 2022-04-06 DIAGNOSIS — J440 Chronic obstructive pulmonary disease with acute lower respiratory infection: Secondary | ICD-10-CM | POA: Diagnosis present

## 2022-04-06 DIAGNOSIS — I2510 Atherosclerotic heart disease of native coronary artery without angina pectoris: Secondary | ICD-10-CM | POA: Diagnosis present

## 2022-04-06 DIAGNOSIS — J42 Unspecified chronic bronchitis: Secondary | ICD-10-CM | POA: Diagnosis not present

## 2022-04-06 DIAGNOSIS — R0681 Apnea, not elsewhere classified: Secondary | ICD-10-CM | POA: Diagnosis not present

## 2022-04-06 DIAGNOSIS — U071 COVID-19: Secondary | ICD-10-CM | POA: Diagnosis not present

## 2022-04-06 DIAGNOSIS — J189 Pneumonia, unspecified organism: Principal | ICD-10-CM

## 2022-04-06 DIAGNOSIS — Z888 Allergy status to other drugs, medicaments and biological substances status: Secondary | ICD-10-CM | POA: Diagnosis not present

## 2022-04-06 DIAGNOSIS — Z800 Family history of malignant neoplasm of digestive organs: Secondary | ICD-10-CM | POA: Diagnosis not present

## 2022-04-06 DIAGNOSIS — F05 Delirium due to known physiological condition: Secondary | ICD-10-CM | POA: Diagnosis not present

## 2022-04-06 DIAGNOSIS — R0989 Other specified symptoms and signs involving the circulatory and respiratory systems: Secondary | ICD-10-CM | POA: Diagnosis not present

## 2022-04-06 DIAGNOSIS — N1832 Chronic kidney disease, stage 3b: Secondary | ICD-10-CM | POA: Diagnosis present

## 2022-04-06 DIAGNOSIS — J90 Pleural effusion, not elsewhere classified: Secondary | ICD-10-CM | POA: Diagnosis not present

## 2022-04-06 DIAGNOSIS — Z79890 Hormone replacement therapy (postmenopausal): Secondary | ICD-10-CM

## 2022-04-06 DIAGNOSIS — Z87891 Personal history of nicotine dependence: Secondary | ICD-10-CM | POA: Diagnosis not present

## 2022-04-06 DIAGNOSIS — Z79899 Other long term (current) drug therapy: Secondary | ICD-10-CM

## 2022-04-06 DIAGNOSIS — R000 Tachycardia, unspecified: Secondary | ICD-10-CM | POA: Diagnosis not present

## 2022-04-06 DIAGNOSIS — Z7952 Long term (current) use of systemic steroids: Secondary | ICD-10-CM

## 2022-04-06 DIAGNOSIS — J449 Chronic obstructive pulmonary disease, unspecified: Secondary | ICD-10-CM | POA: Diagnosis present

## 2022-04-06 DIAGNOSIS — Z789 Other specified health status: Secondary | ICD-10-CM | POA: Diagnosis not present

## 2022-04-06 DIAGNOSIS — Z7982 Long term (current) use of aspirin: Secondary | ICD-10-CM

## 2022-04-06 LAB — CBC WITH DIFFERENTIAL/PLATELET
Abs Immature Granulocytes: 0.09 10*3/uL — ABNORMAL HIGH (ref 0.00–0.07)
Basophils Absolute: 0 10*3/uL (ref 0.0–0.1)
Basophils Relative: 0 %
Eosinophils Absolute: 0 10*3/uL (ref 0.0–0.5)
Eosinophils Relative: 0 %
HCT: 37.1 % (ref 36.0–46.0)
Hemoglobin: 11.2 g/dL — ABNORMAL LOW (ref 12.0–15.0)
Immature Granulocytes: 1 %
Lymphocytes Relative: 11 %
Lymphs Abs: 1.2 10*3/uL (ref 0.7–4.0)
MCH: 25.9 pg — ABNORMAL LOW (ref 26.0–34.0)
MCHC: 30.2 g/dL (ref 30.0–36.0)
MCV: 85.7 fL (ref 80.0–100.0)
Monocytes Absolute: 0.4 10*3/uL (ref 0.1–1.0)
Monocytes Relative: 4 %
Neutro Abs: 8.8 10*3/uL — ABNORMAL HIGH (ref 1.7–7.7)
Neutrophils Relative %: 84 %
Platelets: 163 10*3/uL (ref 150–400)
RBC: 4.33 MIL/uL (ref 3.87–5.11)
RDW: 16.9 % — ABNORMAL HIGH (ref 11.5–15.5)
WBC: 10.5 10*3/uL (ref 4.0–10.5)
nRBC: 0 % (ref 0.0–0.2)

## 2022-04-06 LAB — COMPREHENSIVE METABOLIC PANEL
ALT: 24 U/L (ref 0–44)
AST: 21 U/L (ref 15–41)
Albumin: 3.1 g/dL — ABNORMAL LOW (ref 3.5–5.0)
Alkaline Phosphatase: 81 U/L (ref 38–126)
Anion gap: 13 (ref 5–15)
BUN: 34 mg/dL — ABNORMAL HIGH (ref 8–23)
CO2: 25 mmol/L (ref 22–32)
Calcium: 8.5 mg/dL — ABNORMAL LOW (ref 8.9–10.3)
Chloride: 101 mmol/L (ref 98–111)
Creatinine, Ser: 1.36 mg/dL — ABNORMAL HIGH (ref 0.44–1.00)
GFR, Estimated: 36 mL/min — ABNORMAL LOW (ref >60)
Glucose, Bld: 130 mg/dL — ABNORMAL HIGH (ref 70–99)
Potassium: 4.6 mmol/L (ref 3.5–5.1)
Sodium: 139 mmol/L (ref 135–145)
Total Bilirubin: 1.1 mg/dL (ref 0.3–1.2)
Total Protein: 5.6 g/dL — ABNORMAL LOW (ref 6.5–8.1)

## 2022-04-06 LAB — I-STAT CHEM 8, ED
BUN: 36 mg/dL — ABNORMAL HIGH (ref 8–23)
Calcium, Ion: 1.04 mmol/L — ABNORMAL LOW (ref 1.15–1.40)
Chloride: 101 mmol/L (ref 98–111)
Creatinine, Ser: 1.3 mg/dL — ABNORMAL HIGH (ref 0.44–1.00)
Glucose, Bld: 124 mg/dL — ABNORMAL HIGH (ref 70–99)
HCT: 35 % — ABNORMAL LOW (ref 36.0–46.0)
Hemoglobin: 11.9 g/dL — ABNORMAL LOW (ref 12.0–15.0)
Potassium: 4.5 mmol/L (ref 3.5–5.1)
Sodium: 135 mmol/L (ref 135–145)
TCO2: 26 mmol/L (ref 22–32)

## 2022-04-06 LAB — URINALYSIS, ROUTINE W REFLEX MICROSCOPIC
Bilirubin Urine: NEGATIVE
Glucose, UA: NEGATIVE mg/dL
Hgb urine dipstick: NEGATIVE
Ketones, ur: NEGATIVE mg/dL
Leukocytes,Ua: NEGATIVE
Nitrite: NEGATIVE
Protein, ur: NEGATIVE mg/dL
Specific Gravity, Urine: 1.017 (ref 1.005–1.030)
pH: 7 (ref 5.0–8.0)

## 2022-04-06 LAB — I-STAT VENOUS BLOOD GAS, ED
Acid-Base Excess: 2 mmol/L (ref 0.0–2.0)
Bicarbonate: 26 mmol/L (ref 20.0–28.0)
Calcium, Ion: 1.05 mmol/L — ABNORMAL LOW (ref 1.15–1.40)
HCT: 36 % (ref 36.0–46.0)
Hemoglobin: 12.2 g/dL (ref 12.0–15.0)
O2 Saturation: 78 %
Patient temperature: 37
Potassium: 4.4 mmol/L (ref 3.5–5.1)
Sodium: 136 mmol/L (ref 135–145)
TCO2: 27 mmol/L (ref 22–32)
pCO2, Ven: 35.7 mmHg — ABNORMAL LOW (ref 44–60)
pH, Ven: 7.47 — ABNORMAL HIGH (ref 7.25–7.43)
pO2, Ven: 39 mmHg (ref 32–45)

## 2022-04-06 LAB — APTT: aPTT: 25 seconds (ref 24–36)

## 2022-04-06 LAB — LACTIC ACID, PLASMA
Lactic Acid, Venous: 2.3 mmol/L (ref 0.5–1.9)
Lactic Acid, Venous: 1.3 mmol/L (ref 0.5–1.9)

## 2022-04-06 LAB — PROTIME-INR
INR: 1 (ref 0.8–1.2)
Prothrombin Time: 12.8 seconds (ref 11.4–15.2)

## 2022-04-06 LAB — TROPONIN I (HIGH SENSITIVITY)
Troponin I (High Sensitivity): 33 ng/L — ABNORMAL HIGH (ref <18)
Troponin I (High Sensitivity): 106 ng/L (ref <18)

## 2022-04-06 LAB — BRAIN NATRIURETIC PEPTIDE: B Natriuretic Peptide: 463.3 pg/mL — ABNORMAL HIGH (ref 0.0–100.0)

## 2022-04-06 MED ORDER — ONDANSETRON HCL 4 MG/2ML IJ SOLN: 4.0000 mg | Freq: Four times a day (QID) | INTRAMUSCULAR | Status: DC | PRN

## 2022-04-06 MED ORDER — ACETAMINOPHEN 650 MG RE SUPP
650.0000 mg | Freq: Once | RECTAL | Status: AC
  Administered 2022-04-06: 650 mg via RECTAL
  Filled 2022-04-06: qty 1

## 2022-04-06 MED ORDER — HALOPERIDOL LACTATE 2 MG/ML PO CONC: 0.5000 mg | ORAL | Status: DC | PRN

## 2022-04-06 MED ORDER — GLYCOPYRROLATE 0.2 MG/ML IJ SOLN: 0.2000 mg | INTRAMUSCULAR | Status: DC | PRN

## 2022-04-06 MED ORDER — SODIUM CHLORIDE 0.9 % IV SOLN
2.0000 g | Freq: Once | INTRAVENOUS | Status: AC
  Administered 2022-04-06: 2 g via INTRAVENOUS
  Filled 2022-04-06: qty 12.5

## 2022-04-06 MED ORDER — ACETAMINOPHEN 650 MG RE SUPP: 650.0000 mg | Freq: Four times a day (QID) | RECTAL | Status: DC | PRN

## 2022-04-06 MED ORDER — VANCOMYCIN HCL 750 MG/150ML IV SOLN: 750.0000 mg | INTRAVENOUS | Status: DC

## 2022-04-06 MED ORDER — HALOPERIDOL 0.5 MG PO TABS: 0.5000 mg | ORAL_TABLET | ORAL | Status: DC | PRN

## 2022-04-06 MED ORDER — HALOPERIDOL LACTATE 5 MG/ML IJ SOLN: 0.5000 mg | INTRAMUSCULAR | Status: DC | PRN

## 2022-04-06 MED ORDER — DIPHENHYDRAMINE HCL 50 MG/ML IJ SOLN: 12.5000 mg | INTRAMUSCULAR | Status: DC | PRN

## 2022-04-06 MED ORDER — LORAZEPAM 2 MG/ML PO CONC: 1.0000 mg | ORAL | Status: DC | PRN

## 2022-04-06 MED ORDER — GLYCOPYRROLATE 1 MG PO TABS: 1.0000 mg | ORAL_TABLET | ORAL | Status: DC | PRN

## 2022-04-06 MED ORDER — METHYLPREDNISOLONE SODIUM SUCC 125 MG IJ SOLR
125.0000 mg | Freq: Once | INTRAMUSCULAR | Status: AC
  Administered 2022-04-06: 125 mg via INTRAVENOUS
  Filled 2022-04-06: qty 2

## 2022-04-06 MED ORDER — POLYVINYL ALCOHOL 1.4 % OP SOLN: 1.0000 [drp] | Freq: Four times a day (QID) | OPHTHALMIC | Status: DC | PRN

## 2022-04-06 MED ORDER — LORAZEPAM 1 MG PO TABS: 1.0000 mg | ORAL_TABLET | ORAL | Status: DC | PRN

## 2022-04-06 MED ORDER — VANCOMYCIN HCL IN DEXTROSE 1-5 GM/200ML-% IV SOLN
1000.0000 mg | Freq: Once | INTRAVENOUS | Status: AC
  Administered 2022-04-06: 1000 mg via INTRAVENOUS
  Filled 2022-04-06: qty 200

## 2022-04-06 MED ORDER — LORAZEPAM 2 MG/ML IJ SOLN
1.0000 mg | INTRAMUSCULAR | Status: DC | PRN
  Administered 2022-04-06: 1 mg via INTRAVENOUS
  Filled 2022-04-06: qty 1

## 2022-04-06 MED ORDER — ONDANSETRON 4 MG PO TBDP: 4.0000 mg | ORAL_TABLET | Freq: Four times a day (QID) | ORAL | Status: DC | PRN

## 2022-04-06 MED ORDER — BIOTENE DRY MOUTH MT LIQD: 15.0000 mL | OROMUCOSAL | Status: DC | PRN

## 2022-04-06 MED ORDER — MORPHINE 100MG IN NS 100ML (1MG/ML) PREMIX INFUSION
2.0000 mg/h | INTRAVENOUS | Status: DC
  Administered 2022-04-06 – 2022-04-07 (×2): 5 mg/h via INTRAVENOUS
  Filled 2022-04-06 (×2): qty 100

## 2022-04-06 MED ORDER — MORPHINE BOLUS VIA INFUSION: 2.0000 mg | INTRAVENOUS | Status: DC | PRN

## 2022-04-06 MED ORDER — ACETAMINOPHEN 325 MG PO TABS: 650.0000 mg | ORAL_TABLET | Freq: Four times a day (QID) | ORAL | Status: DC | PRN

## 2022-04-06 NOTE — ED Triage Notes (Signed)
Pt from [facility]. Had covid on the 20th.c/oSOB 45 MIN AGO. HISTORY OF CHF AND COPD. RECEIVED 2.'5MG'$  OF ALBUTEROL AND WAS THEN PLACED ON NRBR. 91% RA. NRBR 96%. BP 196/82 AFIB 86-120.NO HISTORY OF AFIB.

## 2022-04-06 NOTE — Progress Notes (Signed)
Pharmacy Antibiotic Note  [name] is a 88 y.o. female admitted on 03/15/2022 with possible pneumonia.  Pharmacy has been consulted for Vancomycin  dosing.  Vancomycin 1 g IV given in ED at [place] [facility]: Vancomycin 750 mg IV q48h  Height: '5\' 6"'$  (167.6 cm) Weight: 59.9 kg (132 lb 0.9 oz) IBW/kg (Calculated) : 59.3  Temp (24hrs), Avg:99.5 F (37.5 C), Min:99.5 F (37.5 C), Max:99.5 F (37.5 C)  Recent Labs  Lab 03/31/22 0351 04/01/22 0411 04/03/22 0415 03/12/2022 0150 03/08/2022 0202 03/17/2022 0436  WBC 5.5 4.6 3.3* 10.5  --   --   CREATININE 1.32* 1.09* 1.22* 1.36* 1.30*  --   LATICACIDVEN  --   --   --  2.3*  --  1.3    Estimated Creatinine Clearance: 24.8 mL/min (A) (by C-G formula based on SCr of 1.3 mg/dL (H)).    Allergies  Allergen Reactions   Catapres [Clonidine Hcl] Other (See Comments)    Made pt feel horrible, shaky, weak and nausea. Not listed on MAR   Clonidine Other (See Comments)    Made pt feel horrible, shaky, weak and nausea. Not listed on MAR   Lisinopril Anaphylaxis    Tongue swelling. Not listed on MAR   Labetalol Other (See Comments)    Caused bradycardia and syncope. Not listed on MAR   Labetalol Hcl Other (See Comments)    Caused bradycardia and syncope. Not listed on [facility]    Caryl Pina 03/23/2022 7:07 AM

## 2022-04-06 NOTE — Progress Notes (Signed)
Patient 's family  at bedside refused asssement does  not want patient to be turned and moved.Patient is  on  comfort  care.

## 2022-04-06 NOTE — ED Provider Notes (Signed)
[name] Provider Note   CSN: [id number] Arrival date & time: 03/09/2022  0126     History  Chief Complaint  Patient presents with   Shortness of [name] is a 88 y.o. female.  The history is provided by the patient, the EMS personnel and medical records.  Shortness of Breath [name] is a 88 y.o. female who presents to the Emergency Department complaining of shortness of breath.  She presents to the emergency department by EMS from [facility] assisted living for evaluation of shortness of breath.  She was discharged from the hospital yesterday following admission for COVID-19 infection.  Per report she was doing well and developed sudden onset shortness of breath.  EMS report initial diminished air movement and she was treated with a nebulizer 2.5 mg Atrovent.  After treatment patient with crackles in the bases and no additional treatments were provided.  They also report low O2 sats to the upper 80s patient required nonrebreather with improvement in sats to the mid to upper 90s.  Level 5 caveat due to distress.      Home Medications Prior to Admission medications   Medication Sig Start Date End Date Taking? Authorizing Provider  albuterol (PROAIR HFA) 108 (90 Base) MCG/ACT inhaler Inhale 2 puffs into the lungs every 6 (six) hours as needed for wheezing or shortness of breath. 03/19/22   Mannam, Hart Robinsons, MD  ALPRAZolam (XANAX) 0.5 MG tablet TAKE 1 TABLET BY MOUTH 3 TIMES DAILY. Patient taking differently: Take 0.25 mg by mouth 3 (three) times daily as needed for anxiety. 12/18/21   Nafziger, Tommi Rumps, NP  amLODipine (NORVASC) 5 MG tablet TAKE 1 TABLET (5 MG TOTAL) BY MOUTH DAILY. 11/26/21   Skeet Latch, MD  aspirin EC 81 MG EC tablet Take 1 tablet (81 mg total) by mouth daily. Swallow whole. Patient taking differently: Take 81 mg by mouth daily. 12/28/20   Enzo Bi, MD  atorvastatin (LIPITOR) 80 MG tablet TAKE 1 TABLET BY  MOUTH EVERY DAY AT 6PM Patient taking differently: Take 80 mg by mouth daily at 6 PM. 02/22/22   Skeet Latch, MD  Cyanocobalamin (B-12 PO) Take 1 tablet by mouth daily. Patient not taking: Reported on 03/28/2022    [provider]  Fluticasone-Umeclidin-Vilant (TRELEGY ELLIPTA) 100-62.5-25 MCG/ACT AEPB Inhale 1 puff into the lungs daily. 09/19/21   Mannam, Hart Robinsons, MD  furosemide (LASIX) 20 MG tablet TAKE 1 TABLET BY MOUTH EVERY DAY 11/26/21   Skeet Latch, MD  hydrALAZINE (APRESOLINE) 50 MG tablet TAKE 1 TABLET BY MOUTH THREE TIMES A DAY Patient taking differently: Take 50 mg by mouth 3 (three) times daily. 11/26/21   Skeet Latch, MD  ipratropium-albuterol (DUONEB) 0.5-2.5 (3) MG/3ML SOLN Take 3 mLs by nebulization every 6 (six) hours as needed (copd). Patient not taking: Reported on 03/28/2022 03/19/22   Marshell Garfinkel, MD  isosorbide mononitrate (IMDUR) 30 MG 24 hr tablet TAKE 1 TABLET BY MOUTH EVERY DAY Patient taking differently: Take 30 mg by mouth daily. 11/26/21   Skeet Latch, MD  KLOR-CON M20 20 MEQ tablet TAKE 1 TABLET BY MOUTH EVERY DAY Patient taking differently: Take 20 mEq by mouth daily. 11/26/21   Skeet Latch, MD  latanoprost (XALATAN) 0.005 % ophthalmic solution Place 1 drop into both eyes at bedtime.  04/02/19   [provider]  levothyroxine (SYNTHROID) 75 MCG tablet TAKE 1 TABLET BY MOUTH DAILY BEFORE BREAKFAST. Patient taking differently: Take 75 mcg by mouth  daily before breakfast. 04/23/21   Nafziger, Tommi Rumps, NP  loperamide (IMODIUM) 2 MG capsule Take 1 capsule (2 mg total) by mouth as needed for diarrhea or loose stools. 04/03/22   Terrilee Croak, MD  pantoprazole (PROTONIX) 40 MG tablet Take 1 tablet (40 mg total) by mouth daily. SCHEDULE APPT FOR FUTURE REFILLS 01/28/22   Nafziger, Tommi Rumps, NP  predniSONE (DELTASONE) 10 MG tablet Take 1 tablet (10 mg total) by mouth daily for 5 days. 04/04/22 04/09/22  Terrilee Croak, MD  predniSONE (DELTASONE)  5 MG tablet Take 1 tablet (5 mg total) by mouth daily with breakfast. 04/09/22   Dahal, Marlowe Aschoff, MD      Allergies    Catapres [clonidine hcl], Clonidine, Lisinopril, Labetalol, and Labetalol hcl    Review of Systems   Review of Systems  Respiratory:  Positive for shortness of breath.   All other systems reviewed and are negative.   Physical Exam Updated Vital Signs Ht '5\' 6"'$  (1.676 m)   Wt 59.9 kg   BMI 21.31 kg/m  Physical Exam Vitals and nursing note reviewed.  Constitutional:      General: She is in acute distress.     Appearance: She is well-developed. She is ill-appearing.  HENT:     Head: Normocephalic and atraumatic.  Cardiovascular:     Rate and Rhythm: Tachycardia present. Rhythm irregular.     Heart sounds: No murmur heard. Pulmonary:     Effort: Pulmonary effort is normal. No respiratory distress.     Comments: Transmitted upper airway noises.  Clear lungs on expiration in the upper lung fields.  Diminished air movement in lower lung fields. Abdominal:     Palpations: Abdomen is soft.     Tenderness: There is no abdominal tenderness. There is no guarding or rebound.  Musculoskeletal:        General: No tenderness.     Comments: Trace edema to bilateral lower extremities  Skin:    General: Skin is warm and dry.  Neurological:     Mental Status: She is alert.     Comments: Oriented to person, place and recent events moves all extremities symmetrically  Psychiatric:     Comments: Anxious     ED Results / Procedures / Treatments   Labs (all labs ordered are listed, but only abnormal results are displayed) Labs Reviewed  LACTIC ACID, PLASMA - Abnormal; Notable for the following components:      Result Value   Lactic Acid, Venous 2.3 (*)    All other components within normal limits  COMPREHENSIVE METABOLIC PANEL - Abnormal; Notable for the following components:   Glucose, Bld 130 (*)    BUN 34 (*)    Creatinine, Ser 1.36 (*)    Calcium 8.5 (*)    Total  Protein 5.6 (*)    Albumin 3.1 (*)    GFR, Estimated 36 (*)    All other components within normal limits  CBC WITH DIFFERENTIAL/PLATELET - Abnormal; Notable for the following components:   Hemoglobin 11.2 (*)    MCH 25.9 (*)    RDW 16.9 (*)    Neutro Abs 8.8 (*)    Abs Immature Granulocytes 0.09 (*)    All other components within normal limits  BRAIN NATRIURETIC PEPTIDE - Abnormal; Notable for the following components:   B Natriuretic Peptide 463.3 (*)    All other components within normal limits  I-STAT CHEM 8, ED - Abnormal; Notable for the following components:   BUN 36 (*)  Creatinine, Ser 1.30 (*)    Glucose, Bld 124 (*)    Calcium, Ion 1.04 (*)    Hemoglobin 11.9 (*)    HCT 35.0 (*)    All other components within normal limits  I-STAT VENOUS BLOOD GAS, ED - Abnormal; Notable for the following components:   pH, Ven 7.470 (*)    pCO2, Ven 35.7 (*)    Calcium, Ion 1.05 (*)    All other components within normal limits  TROPONIN I (HIGH SENSITIVITY) - Abnormal; Notable for the following components:   Troponin I (High Sensitivity) 33 (*)    All other components within normal limits  TROPONIN I (HIGH SENSITIVITY) - Abnormal; Notable for the following components:   Troponin I (High Sensitivity) 106 (*)    All other components within normal limits  CULTURE, BLOOD (ROUTINE X 2)  CULTURE, BLOOD (ROUTINE X 2)  URINE CULTURE  LACTIC ACID, PLASMA  PROTIME-INR  APTT  URINALYSIS, ROUTINE W REFLEX MICROSCOPIC    EKG EKG Interpretation  Date/Time:  Saturday April 06 2022 01:53:52 EDT Ventricular Rate:  123 PR Interval:    QRS Duration: 118 QT Interval:  338 QTC Calculation: 483 R Axis:   -43 Text Interpretation: Atrial fibrillation with rapid ventricular response Left axis deviation Minimal voltage criteria for LVH, may be normal variant ( Cornell product ) Anteroseptal infarct , age undetermined ST & T wave abnormality, consider lateral ischemia Abnormal ECG  interpretation limited due to poor data quality and artifact Confirmed by Quintella Reichert [phone]) on 03/27/2022 2:03:55 AM  Radiology DG Chest Port 1 View  Result Date: 04/01/2022 CLINICAL DATA:  Possible sepsis. EXAM: PORTABLE CHEST 1 VIEW COMPARISON:  03/28/2022. FINDINGS: Heart is enlarged and the mediastinal contour is stable. Atherosclerotic calcification of the aorta is noted. The pulmonary vasculature is distended. Chronic interstitial prominence is noted bilaterally. A few scattered airspace opacities are noted in the right lung and at the left lung base. There is a small left pleural effusion, unchanged from prior exams. No pneumothorax. No acute osseous abnormality. IMPRESSION: 1. Cardiomegaly with mildly distended pulmonary vasculature. 2. Patchy airspace disease in the right lung and at the left lung base, possible edema, atelectasis, or infiltrate. 3. Stable chronic small left pleural effusion. Electronically Signed   By: Brett Fairy M.D.   On: 03/17/2022 01:59    Procedures Procedures    Medications Ordered in ED Medications  acetaminophen (TYLENOL) suppository 650 mg (has no administration in time range)  methylPREDNISolone sodium succinate (SOLU-MEDROL) 125 mg/2 mL injection 125 mg (has no administration in time range)  ceFEPIme (MAXIPIME) 2 g in sodium chloride 0.9 % 100 mL IVPB (has no administration in time range)    ED Course/ Medical Decision Making/ A&P                           Medical Decision Making Amount and/or Complexity of Data Reviewed Labs: ordered. Radiology: ordered. ECG/medicine tests: ordered.  Risk OTC drugs. Prescription drug management. Decision regarding hospitalization.   Patient with history of his COPD, CHF, aortic stenosis and recent COVID-19 infection here for evaluation of increased shortness of breath.  Patient in distress on ED presentation, improved with repositioning, supplemental oxygen.  Chest x-ray with multifocal infiltrates  concerning for pneumonia or edema-images personally reviewed and interpreted.  She was started on antibiotics.  Plan to admit for ongoing care.  Track contacted for admission.  Attempted to reach patient's daughter over the phone, no  answer.        Final Clinical Impression(s) / ED Diagnoses Final diagnoses:  None    Rx / DC Orders ED Discharge Orders     None         Quintella Reichert, MD 03/22/2022 [phone]

## 2022-04-06 NOTE — Sepsis Progress Note (Signed)
Elink following Code Sepsis. 

## 2022-04-06 NOTE — H&P (Signed)
History and Physical    Patient: [name] LXB:[id number] DOB: 08/21/1924 DOA: 03/13/2022 DOS: the patient was seen and examined on 03/27/2022 PCP: [name], NP  Patient coming from: ALF/ILF - [facility] ([place]); NOK: Daughter, Jose Persia, [phone]   Chief Complaint: SOB  HPI: [name] is a 88 y.o. female with medical history significant of severe COPD on daily prednisone; chronic combined CHF; stage 3b CKD; HTN; HLD; CAD; and hypothyroidism presenting with SOB.  She was last hospitalized from 9/21-27 with acute respiratory failure with hypoxia associated with COVID PNA. She was treated with a course of remedesivir and doxycycline.   Her daughter reports that she had been living independently at home but was having increasing difficulty moving around her split-level home.  She moved to ALF about 2 weeks ago - the patient's other daughter said that if she was moved to a facility she would "get COVID and die."  Unfortunately, she moved in on Friday and tested positive for COVID the following Wednesday.  She was admitted the following day and was discharged almost a week later back to ALF.  She has been too fatigued to participate in activities since being back.  She has never been on home O2 but was discharged on 3L home O2 and was miserable with this.  She became more SOB and had worsening cough yesterday and was sent back in.  The patient reports that she just feels very tired, cannot stay awake.  After long discussion, the patient's daughter reports that she is unlikely to bounce back and would prefer comfort measures.    ER Course:  Carryover, per Dr. Alcario Drought:  COVID-19, just discharged a day or two ago.  Now in to ED with increased SOB.  CXR now showing new infiltrates compared to before.  Needing 2L to maintain sats.  Lactate 2.3. Temp of 100.  Has a DNR, family not answering phone.  A.Fib with EMS  ? New diagnosis. Got solumedrol burst by EDP.     Review of Systems: unable  to review all systems due to the inability of the patient to answer questions. Past Medical History:  Diagnosis Date   Anxiety    Aortic arch atherosclerosis ([place]) 06/24/2014   Atherosclerotic ulcer of aorta ([place]) 06/24/2014   Carotid artery occlusion    Chronic combined systolic and diastolic heart failure ([place]) 09/01/2020   Chronic kidney disease    COPD (chronic obstructive pulmonary disease) ([place])    [place] DISEASE, LUMBAR 12/16/2008   DIVERTICULOSIS, COLON 09/30/2008   DYSPNEA 07/13/2008   Graves disease    History of embolic stroke 6/80/3212   Left brain   HYPERLIPIDEMIA 03/06/2007   HYPERTENSION 03/06/2007   HYPOTHYROIDISM 10/13/2007   OSTEOARTHRITIS 03/06/2007   Personal history of colonic polyps 09/30/2008   Stroke ([place])    06/2014            TOBACCO USE, QUIT 04/12/2009   WEAKNESS 11/09/2007   Past Surgical History:  Procedure Laterality Date   CARDIAC CATHETERIZATION     CATARACT EXTRACTION Bilateral    CHOLECYSTECTOMY     ENDARTERECTOMY Left 11/13/2015   Procedure: LEFT CAROTID ARTERY ENDARTERECTOMY;  Surgeon: Conrad , MD;  Location: [facility] OR;  Service: Vascular;  Laterality: Left;   ESOPHAGOGASTRODUODENOSCOPY (EGD) WITH PROPOFOL N/A 10/11/2016   Procedure: ESOPHAGOGASTRODUODENOSCOPY (EGD) WITH PROPOFOL;  Surgeon: Mauri Pole, MD;  Location: [facility] ENDOSCOPY;  Service: Endoscopy;  Laterality: N/A;   KNEE SURGERY     PATCH ANGIOPLASTY Left 11/13/2015  Procedure: WITH 1CM X 6CM  XENOSURE BIOLOGIC PATCH ANGIOPLASTY;  Surgeon: Conrad Rio Dell, MD;  Location: [facility];  Service: Vascular;  Laterality: Left;   TEE WITHOUT CARDIOVERSION N/A 06/24/2014   Procedure: TRANSESOPHAGEAL ECHOCARDIOGRAM (TEE);  Surgeon: Sanda Klein, MD;  Location[facility] END[facility]COPY;  Service: Cardiovascular;  Laterality: N/A;   Social History:  reports that she quit smoking about 43 years ago. Her smoking use included cigarettes. She started smoking about 72 years ago. She has a 43.50 pack-year smoking history. She has  never used smokeless tobacco. She reports current alcohol use of about 1.0 standard drink of alcohol per week. She reports that she does not use drugs.  Allergies  Allergen Reactions   Catapres [Clonidine Hcl] Other (See Comments)    Made pt feel horrible, shaky, weak and nausea. Not listed on MAR   Clonidine Other (See Comments)    Made pt feel horrible, shaky, weak and nausea. Not listed on MAR   Lisinopril Anaphylaxis    Tongue swelling. Not listed on MAR   Labetalol Other (See Comments)    Caused bradycardia and syncope. Not listed on MAR   Labetalol Hcl Other (See Comments)    Caused bradycardia and syncope. Not listed on MAR    Family History  Problem Relation Age of Onset   Stomach cancer Maternal Grandmother    Colon cancer Neg Hx     Prior to Admission medications   Medication Sig Start Date End Date Taking? Authorizing Provider  albuterol (PROAIR HFA) 108 (90 Base) MCG/ACT inhaler Inhale 2 puffs into the lungs every 6 (six) hours as needed for wheezing or shortness of breath. 03/19/22   Mannam, Hart Robinsons, MD  ALPRAZolam (XANAX) 0.5 MG tablet TAKE 1 TABLET BY MOUTH 3 TIMES DAILY. Patient taking differently: Take 0.25 mg by mouth 3 (three) times daily as needed for anxiety. 12/18/21   Nafziger, Tommi Rumps, NP  amLODipine (NORVASC) 5 MG tablet TAKE 1 TABLET (5 MG TOTAL) BY MOUTH DAILY. 11/26/21   Skeet Latch, MD  aspirin EC 81 MG EC tablet Take 1 tablet (81 mg total) by mouth daily. Swallow whole. Patient taking differently: Take 81 mg by mouth daily. 12/28/20   Enzo Bi, MD  atorvastatin (LIPITOR) 80 MG tablet TAKE 1 TABLET BY MOUTH EVERY DAY AT 6PM Patient taking differently: Take 80 mg by mouth daily at 6 PM. 02/22/22   Skeet Latch, MD  Cyanocobalamin (B-12 PO) Take 1 tablet by mouth daily. Patient not taking: Reported on 03/28/2022    [provider]  Fluticasone-Umeclidin-Vilant (TRELEGY ELLIPTA) 100-62.5-25 MCG/ACT AEPB Inhale 1 puff into the lungs daily.  09/19/21   Mannam, Hart Robinsons, MD  furosemide (LASIX) 20 MG tablet TAKE 1 TABLET BY MOUTH EVERY DAY 11/26/21   Skeet Latch, MD  hydrALAZINE (APRESOLINE) 50 MG tablet TAKE 1 TABLET BY MOUTH THREE TIMES A DAY Patient taking differently: Take 50 mg by mouth 3 (three) times daily. 11/26/21   Skeet Latch, MD  ipratropium-albuterol (DUONEB) 0.5-2.5 (3) MG/3ML SOLN Take 3 mLs by nebulization every 6 (six) hours as needed (copd). Patient not taking: Reported on 03/28/2022 03/19/22   Marshell Garfinkel, MD  isosorbide mononitrate (IMDUR) 30 MG 24 hr tablet TAKE 1 TABLET BY MOUTH EVERY DAY Patient taking differently: Take 30 mg by mouth daily. 11/26/21   Skeet Latch, MD  KLOR-CON M20 20 MEQ tablet TAKE 1 TABLET BY MOUTH EVERY DAY Patient taking differently: Take 20 mEq by mouth daily. 11/26/21   Skeet Latch, MD  latanoprost Ivin Poot)  0.005 % ophthalmic solution Place 1 drop into both eyes at bedtime.  04/02/19   [provider]  levothyroxine (SYNTHROID) 75 MCG tablet TAKE 1 TABLET BY MOUTH DAILY BEFORE BREAKFAST. Patient taking differently: Take 75 mcg by mouth daily before breakfast. 04/23/21   Nafziger, Tommi Rumps, NP  loperamide (IMODIUM) 2 MG capsule Take 1 capsule (2 mg total) by mouth as needed for diarrhea or loose stools. 04/03/22   Terrilee Croak, MD  pantoprazole (PROTONIX) 40 MG tablet Take 1 tablet (40 mg total) by mouth daily. SCHEDULE APPT FOR FUTURE REFILLS 01/28/22   Nafziger, Tommi Rumps, NP  predniSONE (DELTASONE) 10 MG tablet Take 1 tablet (10 mg total) by mouth daily for 5 days. 04/04/22 04/09/22  Terrilee Croak, MD  predniSONE (DELTASONE) 5 MG tablet Take 1 tablet (5 mg total) by mouth daily with breakfast. 04/09/22   Terrilee Croak, MD    Physical Exam: Vitals:   03/11/2022 0915 03/08/2022 1030 03/13/2022 1034 03/11/2022 1100  BP: (!) 121/45 120/60  (!) 111/39  Pulse: 62 (!) 56  (!) 49  Resp: (!) '22 17  16  '$ Temp:   99.3 F (37.4 C)   TempSrc:      SpO2: 98% 100%  99%  Weight:       Height:       General:  Appears frail and somnolent Eyes:   EOMI, normal lids, iris ENT:  hard of hearing, grossly normal lips & tongue, mmm Neck:  no LAD, masses or thyromegaly Cardiovascular:  RRR, no m/r/g. No LE edema.  Respiratory:   Scattered rhonchi.  Mildly increased respiratory effort on 3L [place] O2. Abdomen:  soft, NT, ND Back:   normal alignment Skin:  no rash or induration seen on limited exam Musculoskeletal:  grossly normal tone BUE/BLE, good ROM, no bony abnormality Psychiatric:  somnolent mood and affect, speech sparse Neurologic:  unable to effectively perform   Radiological Exams on Admission: Independently reviewed - see discussion in A/P where applicable  DG Chest Port 1 View  Result Date: 03/15/2022 CLINICAL DATA:  Possible sepsis. EXAM: PORTABLE CHEST 1 VIEW COMPARISON:  03/28/2022. FINDINGS: Heart is enlarged and the mediastinal contour is stable. Atherosclerotic calcification of the aorta is noted. The pulmonary vasculature is distended. Chronic interstitial prominence is noted bilaterally. A few scattered airspace opacities are noted in the right lung and at the left lung base. There is a small left pleural effusion, unchanged from prior exams. No pneumothorax. No acute osseous abnormality. IMPRESSION: 1. Cardiomegaly with mildly distended pulmonary vasculature. 2. Patchy airspace disease in the right lung and at the left lung base, possible edema, atelectasis, or infiltrate. 3. Stable chronic small left pleural effusion. Electronically Signed   By: Brett Fairy M.D.   On: 03/19/2022 01:59    EKG: Independently reviewed.   0153 - Probable afib with rate 123; significant artifact; nonspecific ST changes  0415 - NSR with rate 81; LVH, repol changes noted; nonspecific ST changes, not significantly different from 9/21   Labs on Admission: I have personally reviewed the available labs and imaging studies at the time of the admission.  Pertinent labs:    VBG:  7.470/35.7/26 Glucose 130 BUN 34/Creatinine 1.36/GFR 36; slightly worse than prior BNP 463.3 HS troponin 33, 106 Lactate 2.3, 1.3 Unremarkable CBC INR 1 UA WNL Blood cultures pending   Assessment and Plan: Principal Problem:   Sepsis due to pneumonia ([place]) Active Problems:   COPD GOLD I D   COVID-19 virus infection   Admission for  end of life care    Post-COVID PNA -Recent COVID infection with PNA leading to hospitalization in the setting of baseline COPD, now back with worsening SOB -Patient presenting with cough, decreased oxygen saturation, and multifocal infiltrates on chest x-ray -Also with increasing troponin, mild worsening of renal function -Given her underlying disease in conjunction with current acute illness, her daughter has requested to transition to comfort measures  End of life care -After long discussion in the ER at [facility], family has decided to proceed with comfort care only -Will admit for comfort care and palliative care consult -Patient may be a candidate for [employer] or other residential hospice -However, her reserve is likely quite low given her age and overall frailty and her daughter is concerned about the trauma of transfer and prefers to remain at [facility] at this time -Comfort care order set utilized -No antibiotics or IVF as per family's request -Pain control with morphine drip    Advance Care Planning:   Code Status: DNR   Consults: Palliative care  DVT Prophylaxis: None - comfort measures  Family Communication: Daughter was present throughout evaluation  Severity of Illness: The appropriate patient status for this patient is INPATIENT. Inpatient status is judged to be reasonable and necessary in order to provide the required intensity of service to ensure the patient's safety. The patient's presenting symptoms, physical exam findings, and initial radiographic and laboratory data in the context of their chronic comorbidities is felt to place  them at high risk for further clinical deterioration. Furthermore, it is not anticipated that the patient will be medically stable for discharge from the hospital within 2 midnights of admission.   * I certify that at the point of admission it is my clinical judgment that the patient will require inpatient hospital care spanning beyond 2 midnights from the point of admission due to high intensity of service, high risk for further deterioration and high frequency of surveillance required.*  Author: Karmen Bongo, MD 03/21/2022 11:36 AM  For on call review [email or web address].

## 2022-04-06 NOTE — Consult Note (Signed)
Palliative Care Consult Note                                  Date: 03/09/2022   Patient Name: [name]  DOB: 09-14-1924  MRN: [id number]  Age / Sex: 88 y.o., female  PCP: Gloria Peng, NP Referring Physician: Karmen Bongo, MD  Reason for Consultation: Terminal Care  HPI/Patient Profile: 88 y.o. female  with past medical history of severe COPD on daily prednisone; chronic combined CHF; stage 3b CKD; HTN; HLD; CAD; and hypothyroidism presenting with SOB.  She was last hospitalized from 9/21-27 with acute respiratory failure with hypoxia associated with COVID PNA. Previously lived indepenedently but recently went to ALF due to increased care needs. Workup in the ED found CAP. She was admitted on 03/27/2022 with post-COVID pna. In the ER the patient's daughter elected shift to comfort care.  PMT was consulted for end of life care.   Past Medical History:  Diagnosis Date   Anxiety    Aortic arch atherosclerosis ([place]) 06/24/2014   Atherosclerotic ulcer of aorta ([place]) 06/24/2014   Carotid artery occlusion    Chronic combined systolic and diastolic heart failure ([place]) 09/01/2020   Chronic kidney disease    COPD (chronic obstructive pulmonary disease) ([place])    [place] DISEASE, LUMBAR 12/16/2008   DIVERTICULOSIS, COLON 09/30/2008   DYSPNEA 07/13/2008   Graves disease    History of embolic stroke 0/38/3338   Left brain   HYPERLIPIDEMIA 03/06/2007   HYPERTENSION 03/06/2007   HYPOTHYROIDISM 10/13/2007   OSTEOARTHRITIS 03/06/2007   Personal history of colonic polyps 09/30/2008   Stroke ([place])    06/2014            TOBACCO USE, QUIT 04/12/2009   WEAKNESS 11/09/2007    Subjective:   This NP Walden Field reviewed medical records, received report from team, assessed the patient and then meet at the patient's bedside to discuss diagnosis, prognosis, GOC, EOL wishes disposition and options.  I met with the patient at the bedside who is hard of hearing  but awake and alert, conversational. At the bedside was the patient's daughter and designated surrogate gentleman, granddaughter, sister, Theme park manager.   Concept of Palliative Care was introduced as specialized medical care for people and their families living with serious illness.  If focuses on providing relief from the symptoms and stress of a serious illness.  The goal is to improve quality of life for both the patient and the family. Values and goals of care important to patient and family were attempted to be elicited.  Created space and opportunity for patient  and family to explore thoughts and feelings regarding current medical situation   Natural trajectory and current clinical status were discussed. Questions and concerns addressed. Patient  encouraged to call with questions or concerns.    Patient/Family Understanding of Illness: In addition she previously had COVID and was discharged from the hospital.  Now she is back with more community-acquired pneumonia, likely post COVID-pneumonia.  They note that she has limited respiratory reserve.  Her dyspnea has improved on current 5 mg/h drip of morphine.  Life Review: She has two daughters and two sons.  Goals: Comfort  Today's Discussion: In addition to discussions described above we had further extensive discussion on various topics.  Her limited respiratory reserve and respiratory tenuous status was demonstrated by when I assisted the patient's granddaughter in sliding her up in bed just a  little bit of movement around caused her to be dyspneic and tachypneic.  However, the patient did recover and was cheerful and joking, laughing.  We had a very frank discussion about the likelihood that she could decline and if so it would likely be rapid.    The patient was clear that she wants her decision maker to be daughter [name].  We discussed the premise of comfort care and all that entails. Reviewed that full comfort measures include as  needed medications for increased work of breathing, air hunger, anxiety, restlessness, agitation, and nausea/vomiting.  Reviewed that medications and procedures not focused solely on comfort will be discontinued, including antibiotics.  Oxygen can be continued per family preference if desired but it is generally not accepted as providing comfort but only prolonging the dying process artificially. Family was in agreement with full comfort measures.   I provided emotional and general support through therapeutic listening, empathy, sharing of stories, and other techniques. I answered all questions and addressed all concerns to the best of my ability.  Review of Systems  Constitutional:        Denies pain in general  Respiratory:  Positive for shortness of breath (Improved with morphine).   Gastrointestinal:  Negative for abdominal pain, nausea and vomiting.    Objective:   Primary Diagnoses: Present on Admission:  Sepsis due to pneumonia ([place])  COVID-19 virus infection  COPD GOLD I D   Physical Exam Vitals and nursing note reviewed.  Constitutional:      General: She is sleeping. She is not in acute distress. HENT:     Head: Normocephalic and atraumatic.  Cardiovascular:     Rate and Rhythm: Normal rate.  Pulmonary:     Effort: Pulmonary effort is normal. No respiratory distress (with morphine drip).  Abdominal:     General: Abdomen is flat.     Palpations: Abdomen is soft.  Skin:    General: Skin is warm and dry.  Neurological:     Mental Status: She is easily aroused.  Psychiatric:        Mood and Affect: Mood normal.        Behavior: Behavior normal.     Vital Signs:  BP (!) 122/53   Pulse 63   Temp 99.3 F (37.4 C)   Resp 16   Ht _0  (1.676 m)   Wt 59.9 kg   SpO2 95%   BMI 21.31 kg/m   Palliative Assessment/Data: 30%    Advanced Care Planning:   Primary Decision Maker: PATIENT and Daughter/surrogate  Code Status/Advance Care Planning: DNR  A  discussion was had today regarding advanced directives. Concepts specific to code status, artifical feeding and hydration, continued IV antibiotics and rehospitalization was had.  The difference between a aggressive medical intervention path and a palliative comfort care path for this patient at this time was had.   Decisions/Changes to ACP: None today  Assessment & Plan:   Impression: 88 year old female with recent COVID 19 pneumonia discharged 2 days ago now readmitted with post COVID-pneumonia.  She has poor respiratory reserve, when she came in she had respiratory distress.  After discussion with the patient's family and, specifically, her daughter Joan/designated surrogate she was transitioned to comfort care.  She was started on a morphine drip which seems to be controlling her dyspnea.  However, with minimal movement in the bed she becomes dyspneic and tachypneic.  We discussed continued comfort care.  She will likely have a rapid decline in her  respiratory status given her poor respiratory reserve.  Family is understanding of this.  Overall prognosis grim.  SUMMARY OF RECOMMENDATIONS   Remain DNR Continue comfort care See morphine order adjustment below PMT will continue to follow for symptom follow-up Please notify us of any symptom management needs  Symptom Management:  Change morphine drip to range of 2 to 10 mg/h with adjustment parameters given Continue morphine bolus per infusion 2 mg every 15 minutes as needed dyspnea, pain  Prognosis:  Hours - Days  Discharge Planning:  Anticipated Hospital Death   Discussed with: Patient, patient's family, medical team, nursing team    Thank you for allowing [place] to participate in the care of [place] PMT will continue to support holistically.  Time Total: 90 min  Greater than 50%  of this time was spent counseling and coordinating care related to the above assessment and plan.  Signed by: Walden Field, NP Palliative Medicine  Team  Team Phone # [phone] (Nights/Weekends)  03/18/2022, 12:16 PM

## 2022-04-07 DIAGNOSIS — R0681 Apnea, not elsewhere classified: Secondary | ICD-10-CM

## 2022-04-07 DIAGNOSIS — R0989 Other specified symptoms and signs involving the circulatory and respiratory systems: Secondary | ICD-10-CM

## 2022-04-07 DIAGNOSIS — Z515 Encounter for palliative care: Secondary | ICD-10-CM | POA: Diagnosis not present

## 2022-04-07 DIAGNOSIS — Z66 Do not resuscitate: Secondary | ICD-10-CM | POA: Diagnosis not present

## 2022-04-07 DIAGNOSIS — Z7189 Other specified counseling: Secondary | ICD-10-CM

## 2022-04-07 DIAGNOSIS — A419 Sepsis, unspecified organism: Secondary | ICD-10-CM | POA: Diagnosis not present

## 2022-04-07 DIAGNOSIS — Z789 Other specified health status: Secondary | ICD-10-CM

## 2022-04-07 DIAGNOSIS — J189 Pneumonia, unspecified organism: Secondary | ICD-10-CM | POA: Diagnosis not present

## 2022-04-07 MED ORDER — GLYCOPYRROLATE 0.2 MG/ML IJ SOLN
0.4000 mg | Freq: Once | INTRAMUSCULAR | Status: DC
  Filled 2022-04-07: qty 2

## 2022-04-07 MED ORDER — GLYCOPYRROLATE 0.2 MG/ML IJ SOLN: 0.2000 mg | Freq: Once | INTRAMUSCULAR | Status: DC

## 2022-04-07 DEATH — deceased

## 2022-04-08 NOTE — Telephone Encounter (Signed)
Message closed.

## 2022-04-09 LAB — URINE CULTURE: Culture: 30000 — AB

## 2022-04-11 LAB — CULTURE, BLOOD (ROUTINE X 2)
Culture: NO GROWTH
Culture: NO GROWTH
Special Requests: ADEQUATE
Special Requests: ADEQUATE

## 2022-04-25 ENCOUNTER — Other Ambulatory Visit: Payer: Self-pay | Admitting: Adult Health

## 2022-05-08 ENCOUNTER — Telehealth: Payer: Self-pay | Admitting: Adult Health

## 2022-05-08 NOTE — [place] Notes (Signed)
Patient's family does not want vital signs taken,will continue to monitor.

## 2022-05-08 NOTE — Telephone Encounter (Signed)
Please disregard

## 2022-05-08 NOTE — Death Summary Note (Signed)
DEATH SUMMARY   Patient Details  Name: [name] MRN: [id number] DOB: June 11, 1925 PYK:DXIPJASN, Tommi Rumps, NP Admission/Discharge Information   Admit Date:  04-21-22  Date of Death:  04-22-22  Time of Death:  12:45pm  Length of Stay: 1   Principle Cause of death: POst-COVID pneumonia  Hospital Diagnoses: Principal Problem:   Sepsis due to pneumonia [facility]) Active Problems:   COPD GOLD I D   COVID-19 virus infection   Admission for end of life care   Hospital Course:  severe COPD on daily prednisone; chronic combined CHF; stage 3b CKD; HTN; HLD; CAD; and hypothyroidism presenting with SOB.  She was last hospitalized from 9/21-27 with acute respiratory failure with hypoxia associated with COVID PNA. She was treated with a course of remedesivir and doxycycline  and discharged back to ALF with home o2. She has been too fatigued to participate in activities since being back to ALF.  She became more SOB and had worsening cough yesterday and  decreased oxygen saturation, EMS called , per EMS patient's sats was in upper 80s , she was put on nonrebreather with improvement in sats to the mid to upper 90s.she was sent back to the hospital.  she is found to have multifocal infiltrates on chest x-ray, elevated troponin, elevated cr, The patient reports that she just feels very tired, cannot stay awake.  EDP/admitting MD and palliative care had long discussion with family on admission, decision to proceed comfort measures. She was started on a morphine drip  per palliative care for her dyspnea. she expired on 04/22/22 at 12:45pm.   Consultations: palliative care  The results of significant diagnostics from this hospitalization (including imaging, microbiology, ancillary and laboratory) are listed below for reference.   Significant Diagnostic Studies: DG Chest Port 1 View  Result Date: 04/21/22 CLINICAL DATA:  Possible sepsis. EXAM: PORTABLE CHEST 1 VIEW COMPARISON:  03/28/2022. FINDINGS:  Heart is enlarged and the mediastinal contour is stable. Atherosclerotic calcification of the aorta is noted. The pulmonary vasculature is distended. Chronic interstitial prominence is noted bilaterally. A few scattered airspace opacities are noted in the right lung and at the left lung base. There is a small left pleural effusion, unchanged from prior exams. No pneumothorax. No acute osseous abnormality. IMPRESSION: 1. Cardiomegaly with mildly distended pulmonary vasculature. 2. Patchy airspace disease in the right lung and at the left lung base, possible edema, atelectasis, or infiltrate. 3. Stable chronic small left pleural effusion. Electronically Signed   By: Brett Fairy M.D.   On: Apr 21, 2022 01:59   ECHOCARDIOGRAM COMPLETE  Result Date: 03/31/2022    ECHOCARDIOGRAM REPORT   Patient Name:   [name] Date of Exam: 03/31/2022 Medical Rec #:  [id number]     Height:       66.0 in Accession #:    [id number]    Weight:       133.8 lb Date of Birth:  26-May-1925    BSA:          1.686 m Patient Age:    88 years      BP:           167/95 mmHg Patient Gender: F             HR:           66 bpm. Exam Location:  Inpatient Procedure: 2D Echo Indications:    Dyspnea  History:        Patient has prior history of Echocardiogram examinations, most  recent 12/25/2020. CHF, Stroke; Risk Factors:Former Smoker,                 Hypertension and Dyslipidemia.  Sonographer:    Harvie Junior Referring Phys: Theola Sequin  Sonographer Comments: Technically difficult study due to poor echo windows. Image acquisition challenging due to COPD, supine and Image acquisition challenging due to respiratory motion. IMPRESSIONS  1. Left ventricular ejection fraction, by estimation, is 40 to 45%. The left ventricle has mildly decreased function. The left ventricle demonstrates global hypokinesis. There is mild left ventricular hypertrophy. Left ventricular diastolic parameters are consistent with Grade I diastolic  dysfunction (impaired relaxation).  2. Right ventricular systolic function is normal. The right ventricular size is normal. There is normal pulmonary artery systolic pressure. The estimated right ventricular systolic pressure is 42.5 mmHg.  3. Left atrial size was mildly dilated.  4. The mitral valve is normal in structure. Trivial mitral valve regurgitation.  5. The inferior vena cava is dilated in size with <50% respiratory variability, suggesting right atrial pressure of 15 mmHg.  6. The aortic valve is calcified. There is severe calcification of the aortic valve. Aortic valve regurgitation is trivial. Severe aortic valve stenosis. Moderate AS by gradients (Vmax 3.1 m/s, MG 88mHg), severe by AVA (0.7 cm^2) and DI (0.25). Low SV index (31 cc/m^2), suspect low flow low gradient severe AS FINDINGS  Left Ventricle: Left ventricular ejection fraction, by estimation, is 40 to 45%. The left ventricle has mildly decreased function. The left ventricle demonstrates global hypokinesis. The left ventricular internal cavity size was normal in size. There is  mild left ventricular hypertrophy. Left ventricular diastolic parameters are consistent with Grade I diastolic dysfunction (impaired relaxation). Right Ventricle: The right ventricular size is normal. No increase in right ventricular wall thickness. Right ventricular systolic function is normal. There is normal pulmonary artery systolic pressure. The tricuspid regurgitant velocity is 2.10 m/s, and  with an assumed right atrial pressure of 15 mmHg, the estimated right ventricular systolic pressure is 395.6mmHg. Left Atrium: Left atrial size was mildly dilated. Right Atrium: Right atrial size was normal in size. Pericardium: There is no evidence of pericardial effusion. Mitral Valve: The mitral valve is normal in structure. Trivial mitral valve regurgitation. Tricuspid Valve: The tricuspid valve is normal in structure. Tricuspid valve regurgitation is trivial. Aortic  Valve: The aortic valve is calcified. There is severe calcifcation of the aortic valve. Aortic valve regurgitation is trivial. Aortic regurgitation PHT measures 802 msec. Moderate to severe aortic stenosis is present. Aortic valve mean gradient measures 19.4 mmHg. Aortic valve peak gradient measures 36.0 mmHg. Aortic valve area, by VTI measures 0.78 cm. Pulmonic Valve: The pulmonic valve was not well visualized. Pulmonic valve regurgitation is not visualized. Aorta: The aortic root and ascending aorta are structurally normal, with no evidence of dilitation. Venous: The inferior vena cava is dilated in size with less than 50% respiratory variability, suggesting right atrial pressure of 15 mmHg. IAS/Shunts: The interatrial septum was not well visualized. Additional Comments: There is pleural effusion in the left lateral region.  LEFT VENTRICLE PLAX 2D LVIDd:         4.80 cm      Diastology LVIDs:         3.60 cm      LV e' medial:    4.05 cm/s LV PW:         1.10 cm      LV E/e' medial:  16.0 LV IVS:  1.10 cm      LV e' lateral:   6.38 cm/s LVOT diam:     1.90 cm      LV E/e' lateral: 10.1 LV SV:         52 LV SV Index:   31 LVOT Area:     2.84 cm  LV Volumes (MOD) LV vol d, MOD A2C: 106.0 ml LV vol d, MOD A4C: 79.6 ml LV vol s, MOD A2C: 60.7 ml LV vol s, MOD A4C: 45.1 ml LV SV MOD A2C:     45.3 ml LV SV MOD A4C:     79.6 ml LV SV MOD BP:      45.2 ml RIGHT VENTRICLE RV Basal diam:  3.10 cm RV Mid diam:    2.60 cm RV S prime:     13.20 cm/s TAPSE (M-mode): 1.7 cm LEFT ATRIUM             Index        RIGHT ATRIUM           Index LA diam:        3.10 cm 1.84 cm/m   RA Area:     12.10 cm LA Vol (A2C):   81.8 ml 48.52 ml/m  RA Volume:   25.50 ml  15.13 ml/m LA Vol (A4C):   51.2 ml 30.37 ml/m LA Biplane Vol: 65.8 ml 39.03 ml/m  AORTIC VALVE                     PULMONIC VALVE AV Area (Vmax):    0.72 cm      PV Vmax:       1.37 m/s AV Area (Vmean):   0.67 cm      PV Peak grad:  7.5 mmHg AV Area (VTI):      0.78 cm AV Vmax:           299.80 cm/s AV Vmean:          206.200 cm/s AV VTI:            0.672 m AV Peak Grad:      36.0 mmHg AV Mean Grad:      19.4 mmHg LVOT Vmax:         75.80 cm/s LVOT Vmean:        48.400 cm/s LVOT VTI:          0.185 m LVOT/AV VTI ratio: 0.28 AI PHT:            802 msec  AORTA Ao Root diam: 3.00 cm Ao Asc diam:  2.70 cm MITRAL VALVE                TRICUSPID VALVE MV Area (PHT): 3.00 cm     TR Peak grad:   17.6 mmHg MV Decel Time: 253 msec     TR Vmax:        210.00 cm/s MR Peak grad: 66.9 mmHg MR Vmax:      409.00 cm/s   SHUNTS MV E velocity: 64.70 cm/s   Systemic VTI:  0.18 m MV A velocity: 102.00 cm/s  Systemic Diam: 1.90 cm MV E/A ratio:  0.63 Oswaldo Milian MD Electronically signed by Oswaldo Milian MD Signature Date/Time: 03/31/2022/12:36:34 PM    Final    DG Chest Port 1 View  Result Date: 03/28/2022 CLINICAL DATA:  Short of breath, weakness, possible COVID EXAM: PORTABLE CHEST 1 VIEW COMPARISON:  Prior chest x-ray 02/27/2021 FINDINGS: Stable mild cardiomegaly. Atherosclerotic  calcifications again noted throughout the thoracic aorta. Background bronchitic changes are stable. Left lower lobe patchy airspace opacity also appears similar compared to prior. Linear atelectasis versus scarring in the right lung base. No new airspace opacity, pulmonary edema or pneumothorax. No acute osseous abnormality. Degenerative changes at both glenohumeral joints. IMPRESSION: Stable chest x-ray without evidence of acute cardiopulmonary process. Electronically Signed   By: Jacqulynn Cadet M.D.   On: 03/28/2022 15:33    Microbiology: Recent Results (from the past 240 hour(s))  Resp Panel by RT-PCR (Flu A&B, Covid) Anterior Nasal Swab     Status: Abnormal   Collection Time: 03/28/22  2:57 PM   Specimen: Anterior Nasal Swab  Result Value Ref Range Status   SARS Coronavirus 2 by RT PCR POSITIVE (A) NEGATIVE Final    Comment: (NOTE) SARS-CoV-2 target nucleic acids are  DETECTED.  The SARS-CoV-2 RNA is generally detectable in upper respiratory specimens during the acute phase of infection. Positive results are indicative of the presence of the identified virus, but do not rule out bacterial infection or co-infection with other pathogens not detected by the test. Clinical correlation with patient history and other diagnostic information is necessary to determine patient infection status. The expected result is Negative.  Fact Sheet for Patients: EntrepreneurPulse.com.au  Fact Sheet for Healthcare Providers: IncredibleEmployment.be  This test is not yet approved or cleared by the [place] FDA and  has been authorized for detection and/or diagnosis of SARS-CoV-2 by FDA under an Emergency Use Authorization (EUA).  This EUA will remain in effect (meaning this test can be used) for the duration of  the COVID-19 declaration under Section 564(b)(1) of the A ct, 21 U.S.C. section 360bbb-3(b)(1), unless the authorization is terminated or revoked sooner.     Influenza A by PCR NEGATIVE NEGATIVE Final   Influenza B by PCR NEGATIVE NEGATIVE Final    Comment: (NOTE) The Xpert Xpress SARS-CoV-2/FLU/RSV plus assay is intended as an aid in the diagnosis of influenza from Nasopharyngeal swab specimens and should not be used as a sole basis for treatment. Nasal washings and aspirates are unacceptable for Xpert Xpress SARS-CoV-2/FLU/RSV testing.  Fact Sheet for Patients: EntrepreneurPulse.com.au  Fact Sheet for Healthcare Providers: IncredibleEmployment.be  This test is not yet approved or cleared by the [place] FDA and has been authorized for detection and/or diagnosis of SARS-CoV-2 by FDA under an Emergency Use Authorization (EUA). This EUA will remain in effect (meaning this test can be used) for the duration of the COVID-19 declaration under Section 564(b)(1) of the Act, 21  U.S.C. section 360bbb-3(b)(1), unless the authorization is terminated or revoked.  Performed at [facility], [place] [address], [place], [place] [address]   Culture, blood (Routine X 2) w Reflex to ID Panel     Status: None   Collection Time: 03/28/22 10:36 PM   Specimen: BLOOD  Result Value Ref Range Status   Specimen Description   Final    BLOOD RIGHT ANTECUBITAL Performed at [facility] [address] [place], [place] [address]    Special Requests   Final    BOTTLES DRAWN AEROBIC ONLY Blood Culture adequate volume Performed at [facility] [address], [place], [place] [address]    Culture   Final    NO GROWTH 5 DAYS Performed at [facility], [place] [address] [place], [place] [address]    Report Status 04/03/2022 FINAL  Final  Culture, blood (Routine X 2) w Reflex to  ID Panel     Status: None   Collection Time: 03/28/22 10:41 PM   Specimen: BLOOD  Result Value Ref Range Status   Specimen Description   Final    BLOOD RIGHT ANTECUBITAL Performed at [facility] [address], [place], [place] [address]    Special Requests   Final    BOTTLES DRAWN AEROBIC ONLY Blood Culture adequate volume Performed at [facility] [address] [place], [place] [address]    Culture   Final    NO GROWTH 5 DAYS Performed at [facility], [place] [address], [place], [place] [address]    Report Status 04/03/2022 FINAL  Final  MRSA Next Gen by PCR, Nasal     Status: None   Collection Time: 04/02/22  5:13 AM   Specimen: Nasal Mucosa; Nasal Swab  Result Value Ref Range Status   MRSA by PCR Next Gen NOT DETECTED NOT DETECTED Final    Comment: (NOTE) The GeneXpert MRSA Assay (FDA approved for NASAL specimens only), is one component of a comprehensive MRSA colonization surveillance program. It is not intended to diagnose MRSA infection nor to guide or monitor treatment for MRSA  infections. Test performance is not FDA approved in patients less than 32 years old. Performed at [facility], [place] [address], [place], [place] [address]   Urine Culture     Status: None (Preliminary result)   Collection Time: 03/19/2022  1:44 AM   Specimen: In/Out Cath Urine  Result Value Ref Range Status   Specimen Description IN/OUT CATH URINE  Final   Special Requests NONE  Final   Culture   Final    CULTURE REINCUBATED FOR BETTER GROWTH Performed at [facility], [place] [address], [place], [place] [address]    Report Status PENDING  Incomplete  Blood Culture (routine x 2)     Status: None (Preliminary result)   Collection Time: 03/17/2022  1:50 AM   Specimen: BLOOD RIGHT FOREARM  Result Value Ref Range Status   Specimen Description BLOOD RIGHT FOREARM  Final   Special Requests   Final    BOTTLES DRAWN AEROBIC AND ANAEROBIC Blood Culture adequate volume   Culture   Final    NO GROWTH < 12 HOURS Performed at [facility], [place] [address] [place], [place] [address]    Report Status PENDING  Incomplete  Blood Culture (routine x 2)     Status: None (Preliminary result)   Collection Time: 04/02/2022  2:11 AM   Specimen: BLOOD RIGHT ARM  Result Value Ref Range Status   Specimen Description BLOOD RIGHT ARM  Final   Special Requests   Final    BOTTLES DRAWN AEROBIC AND ANAEROBIC Blood Culture adequate volume   Culture   Final    NO GROWTH < 12 HOURS Performed at [facility], [place] [address] [place], [place] [address]    Report Status PENDING  Incomplete      Signed: Florencia Reasons, MD PhD Rosalita Chessman

## 2022-05-08 NOTE — Progress Notes (Signed)
Daily Progress Note   Patient Name: [name]       Date: 04/08/22 DOB: 30-Apr-1925  Age: 88 y.o. MRN#: [id number] Attending Physician: Florencia Reasons, MD Primary Care Physician: [name], NP Admit Date: 03/31/2022  Reason for Consultation/Follow-up: Non pain symptom management, Pain control, Psychosocial/spiritual support, and Terminal Care  Subjective: Chart review performed.  Received report from primary RN -no acute concerns.  RN reports patient is lethargic, experiencing apneic episodes, not eating/drinking, oxygen weaned to off.  Went to visit patient at bedside - daughter/Joan, granddaughter/Lindsey with significant other, and grandson/Graham with significant other.  Patient was lying in bed -she does not wake to voice/gentle touch. No signs or non-verbal gestures of pain or discomfort noted. No respiratory distress or increased work of breathing; secretions audible without stethoscope.  Patient's jaw is relaxed open.  She is on room air.  10 to 15 second apnea periods noted throughout visit.  She is on 5 mg/h morphine drip.  Patient's hands and feet are cool to the touch with faint pulses noted.  Emotional support provided to family.  Therapeutic listening and emotional support provided as they reflect over the last 24 hours.  Validated that on comfort measures we often elevate being asleep and comfortable over being awake and uncomfortable -family are appreciative that patient's symptoms are well managed today.  Prognostication reviewed that patient will likely pass within the next several hours to 24 hours.  Family are hopeful that patient's life will not be prolonged -they question if morphine drip could be increased.  Education provided that medical team cannot legally or ethically  hasten patient's death; however, will support her symptoms and adjust medications as needed to ensure end-of-life comfort.   Discussed symptom management plan to include scheduled Robinul for secretions -family agreeable.  Natural trajectory and expectations at end-of-life was reviewed per their line of questioning.   Family expressed appreciation for PMT visit today.  All questions and concerns addressed. Encouraged to call with questions and/or concerns. PMT card provided.  Discussed with primary RN symptom management plan.  Length of Stay: 1  Current Medications: Scheduled Meds:    Continuous Infusions:  morphine 5 mg/hr (April 08, 2022 0843)    PRN Meds: acetaminophen **OR** acetaminophen, antiseptic oral rinse, diphenhydrAMINE, glycopyrrolate **OR** glycopyrrolate **OR** glycopyrrolate, haloperidol **OR** haloperidol **OR** haloperidol lactate, LORazepam **OR** [  DISCONTINUED] LORazepam **OR** LORazepam, morphine, ondansetron **OR** ondansetron (ZOFRAN) IV, polyvinyl alcohol  Physical Exam Vitals and nursing note reviewed.  Constitutional:      General: She is not in acute distress.    Appearance: She is ill-appearing.  Pulmonary:     Effort: No respiratory distress.  Skin:    General: Skin is warm and dry.  Neurological:     Mental Status: She is unresponsive.     Motor: Weakness present.  Psychiatric:        Speech: She is noncommunicative.             Vital Signs: BP (!) 83/43 (BP Location: Right Arm)   Pulse 76   Temp 97.9 F (36.6 C) (Oral)   Resp (!) 4   Ht '5\' 6"'$  (1.676 m)   Wt 59.9 kg   SpO2 (!) 78%   BMI 21.31 kg/m  SpO2: SpO2: (!) 78 % O2 Device: O2 Device: Nasal Cannula O2 Flow Rate: O2 Flow Rate (L/min): 2 L/min  Intake/output summary:  Intake/Output Summary (Last 24 hours) at 05/04/22 1132 Last data filed at 03/21/2022 1516 Gross per 24 hour  Intake 23.58 ml  Output --  Net 23.58 ml   LBM: Last BM Date : 04/03/22 Baseline Weight: Weight: 59.9  kg Most recent weight: Weight: 59.9 kg       Palliative Assessment/Data: PPS 10%      Patient Active Problem List   Diagnosis Date Noted   Sepsis due to pneumonia ([facility]) 03/28/2022   Admission for end of life care 04/05/2022   COVID-19 virus infection 03/28/2022   Acute hyponatremia 03/28/2022   Severe sepsis ([place]) 03/28/2022   Aortic stenosis 07/16/2021   Chronic combined systolic and diastolic CHF (congestive heart failure) ([place]) 02/27/2021   Brain aneurysm 02/27/2021   Cervical spinal stenosis 02/27/2021   Cerebral thrombosis with cerebral infarction 12/25/2020   Right hand weakness 12/23/2020   SVT (supraventricular tachycardia) 09/01/2020   Chronic combined systolic and diastolic heart failure ([place]) 09/01/2020   Acute respiratory failure with hypoxia ([facility])    Mild aortic stenosis 08/30/2020   Elevated troponin 08/30/2020   Pleural effusion on left 08/30/2020   Pain in joint of left knee 04/10/2020   D-dimer, elevated 01/14/2020   Elevated brain natriuretic peptide (BNP) level 01/14/2020   Current chronic use of systemic steroids 07/13/2019   Medication management 04/02/2018   COPD exacerbation ([place]) 03/31/2018   Hypokalemia 03/30/2018   COPD with acute exacerbation ([facility]) 03/02/2018   SOB (shortness of breath) 01/12/2018   HLD (hyperlipidemia) 01/12/2018   Stage 3b chronic kidney disease (CKD) ([employer]) 01/12/2018   New onset left bundle branch block (LBBB) 01/12/2018   COPD GOLD I D 01/12/2018   Neurogenic claudication due to lumbar spinal stenosis 09/13/2017   Anemia of chronic disease    AVM (arteriovenous malformation) of duodenum, acquired with hemorrhage    Acute GI bleeding 10/09/2016   COPD with emphysema ([place]) 05/10/2016   Recurrent laryngeal neuropathy 02/01/2016   Nausea with vomiting 12/11/2015   Left carotid stenosis 11/21/2015   Asymptomatic carotid artery stenosis 11/13/2015   Impaired glucose tolerance 10/27/2015   Solitary pulmonary nodule  10/27/2015   Thyroid nodule 10/27/2015   Syncope and collapse 10/04/2015   Bradycardia with less than 60 beats per minute 10/04/2015   History of embolic stroke 74/25/9563   Paresthesia of both feet 07/06/2014   Aortic arch atherosclerosis ([place]) 06/24/2014   Atherosclerotic ulcer of aorta ([place]) 06/24/2014   Stroke ([place])  06/22/2014   Bilateral carotid artery disease ([place]) 05/18/2014   TOBACCO USE, QUIT 04/12/2009   DISC DISEASE, LUMBAR 12/16/2008   DIVERTICULOSIS, COLON 09/30/2008   Personal history of colonic polyps 09/30/2008   DYSPNEA 07/13/2008   Weakness 11/09/2007   Hypothyroidism 10/13/2007   Mixed hyperlipidemia 03/06/2007   Essential hypertension 03/06/2007   Osteoarthritis 03/06/2007    Palliative Care Assessment & Plan   Patient Profile: 88 y.o. female  with past medical history of severe COPD on daily prednisone; chronic combined CHF; stage 3b CKD; HTN; HLD; CAD; and hypothyroidism presenting with SOB.  She was last hospitalized from 9/21-27 with acute respiratory failure with hypoxia associated with COVID PNA. Previously lived indepenedently but recently went to ALF due to increased care needs. Workup in the ED found CAP. She was admitted on 04/05/2022 with post-COVID pna. In the ER the patient's daughter elected shift to comfort care.   PMT was consulted for end of life care.   Assessment: Principal Problem:   Sepsis due to pneumonia [facility]) Active Problems:   COPD GOLD I D   COVID-19 virus infection   Admission for end of life care   Terminal care  Recommendations/Plan: Continue full comfort measures Continue DNR/DNI as previously documented Patient will likely pass within the next several hours to 24 hours - anticipate hospital death Continue current comfort focused medication regimen; adjustments to Robinul as noted below PMT will continue to follow and support holistically  Symptom Management Continuous morphine infusion; continue PRN bolus doses for  breakthrough symptoms pain/dyspnea/increased work of breathing/RR>25 One-time dose of 0.4 mg Robinul to be given now and 0.2 mg Robinul to be given in 4 hours - continue PRN doses for breakthrough symptoms Tylenol PRN pain/fever Benadryl PRN itching Robinul PRN secretions Haldol PRN agitation/delirium Ativan PRN anxiety/seizure/sleep/distress Zofran PRN nausea/vomiting Liquifilm Tears PRN dry eye   Goals of Care and Additional Recommendations: Limitations on Scope of Treatment: Full Comfort Care  Code Status:    Code Status Orders  (From admission, onward)           Start     Ordered   03/23/2022 0930  Do not attempt resuscitation (DNR)  Continuous       Question Answer Comment  In the event of cardiac or respiratory ARREST Do not call a "code blue"   In the event of cardiac or respiratory ARREST Do not perform Intubation, CPR, defibrillation or ACLS   In the event of cardiac or respiratory ARREST Use medication by any route, position, wound care, and other measures to relive pain and suffering. May use oxygen, suction and manual treatment of airway obstruction as needed for comfort.      03/30/2022 0930           Code Status History     Date Active Date Inactive Code Status Order ID Comments User Context   03/28/2022 1957 04/03/2022 2218 DNR [id number]  Rhetta Mura, DO ED   02/27/2021 1415 02/28/2021 1920 DNR [id number]  Orma Flaming, MD ED   12/24/2020 2016 12/27/2020 2252 DNR [id number]  Kayleen Memos, DO Inpatient   08/31/2020 0922 09/04/2020 1549 DNR [id number]  Elmarie Shiley, MD ED   08/30/2020 2146 08/31/2020 0922 Full Code [id number]  Vernelle Emerald, MD ED   01/14/2020 2339 01/16/2020 1447 Full Code [id number]  Chotiner, Yevonne Aline, MD ED   03/02/2018 1659 03/04/2018 1500 Full Code [id number]  Karmen Bongo, MD Inpatient   01/12/2018 2210 01/14/2018 2135 Full Code [id number]  Ivor Costa, MD ED   10/09/2016 1958 10/12/2016 2135 Full Code [id number]  Doreatha Lew, MD ED    11/21/2015 2047 11/23/2015 2122 Full Code [id number]  Angelia Mould, MD Inpatient   10/04/2015 1658 10/05/2015 1811 Full Code [id number]  Arbutus Leas, NP Inpatient   06/22/2014 1209 06/24/2014 1859 Full Code [id number]  Caren Griffins, MD ED       Prognosis:  Hours - Days  Discharge Planning: Anticipated Hospital Death  Care plan was discussed with primary RN, patient's family  Thank you for allowing the Palliative Medicine Team to assist in the care of this patient.  Lin Landsman, NP  Please contact Palliative Medicine Team phone at [phone] for questions and concerns.    *Portions of this note are a verbal dictation therefore any spelling and/or grammatical errors are due to the "[employer]" system interpretation.

## 2022-05-08 NOTE — Progress Notes (Signed)
Patient death at 74. Confirmed by Probation officer and  Passenger transport manager. MD Xu aware. Morphine drip 166m wasted with Holli S. RN.

## 2022-05-08 DEATH — deceased
